# Patient Record
Sex: Male | Born: 1955 | Race: White | Hispanic: No | Marital: Married | Smoking: Never smoker
Health system: Southern US, Community
[De-identification: ages and names within clinical notes are randomized; demographics above are authoritative.]

## PROBLEM LIST (undated history)

## (undated) DIAGNOSIS — Z9289 Personal history of other medical treatment: Secondary | ICD-10-CM

## (undated) DIAGNOSIS — E119 Type 2 diabetes mellitus without complications: Secondary | ICD-10-CM

## (undated) DIAGNOSIS — M1990 Unspecified osteoarthritis, unspecified site: Secondary | ICD-10-CM

## (undated) DIAGNOSIS — I4891 Unspecified atrial fibrillation: Secondary | ICD-10-CM

## (undated) DIAGNOSIS — G4733 Obstructive sleep apnea (adult) (pediatric): Secondary | ICD-10-CM

## (undated) DIAGNOSIS — I10 Essential (primary) hypertension: Secondary | ICD-10-CM

## (undated) DIAGNOSIS — I2510 Atherosclerotic heart disease of native coronary artery without angina pectoris: Secondary | ICD-10-CM

## (undated) DIAGNOSIS — I5042 Chronic combined systolic (congestive) and diastolic (congestive) heart failure: Secondary | ICD-10-CM

## (undated) DIAGNOSIS — Z9889 Other specified postprocedural states: Secondary | ICD-10-CM

## (undated) DIAGNOSIS — I639 Cerebral infarction, unspecified: Secondary | ICD-10-CM

## (undated) DIAGNOSIS — I219 Acute myocardial infarction, unspecified: Secondary | ICD-10-CM

## (undated) DIAGNOSIS — I471 Supraventricular tachycardia, unspecified: Secondary | ICD-10-CM

## (undated) DIAGNOSIS — Z9989 Dependence on other enabling machines and devices: Secondary | ICD-10-CM

## (undated) DIAGNOSIS — J449 Chronic obstructive pulmonary disease, unspecified: Secondary | ICD-10-CM

## (undated) DIAGNOSIS — K219 Gastro-esophageal reflux disease without esophagitis: Secondary | ICD-10-CM

## (undated) DIAGNOSIS — A419 Sepsis, unspecified organism: Secondary | ICD-10-CM

## (undated) DIAGNOSIS — I350 Nonrheumatic aortic (valve) stenosis: Secondary | ICD-10-CM

## (undated) DIAGNOSIS — J101 Influenza due to other identified influenza virus with other respiratory manifestations: Secondary | ICD-10-CM

## (undated) HISTORY — PX: TRACHEOSTOMY: SUR1362

## (undated) HISTORY — PX: TRACHEOSTOMY CLOSURE: SHX458

## (undated) HISTORY — PX: KNEE SURGERY: SHX244

## (undated) HISTORY — PX: BUNIONECTOMY: SHX129

---

## 2004-05-08 ENCOUNTER — Ambulatory Visit: Payer: Self-pay | Admitting: Unknown Physician Specialty

## 2004-10-08 ENCOUNTER — Ambulatory Visit: Payer: Self-pay | Admitting: Cardiovascular Disease

## 2004-12-30 ENCOUNTER — Encounter: Payer: Self-pay | Admitting: Cardiovascular Disease

## 2004-12-31 ENCOUNTER — Encounter: Payer: Self-pay | Admitting: Cardiovascular Disease

## 2005-01-14 ENCOUNTER — Encounter: Payer: Self-pay | Admitting: Cardiovascular Disease

## 2005-02-14 ENCOUNTER — Encounter: Payer: Self-pay | Admitting: Cardiovascular Disease

## 2005-03-16 ENCOUNTER — Encounter: Payer: Self-pay | Admitting: Cardiovascular Disease

## 2005-08-18 ENCOUNTER — Ambulatory Visit: Payer: Self-pay | Admitting: Cardiovascular Disease

## 2005-10-26 ENCOUNTER — Emergency Department: Payer: Self-pay | Admitting: Emergency Medicine

## 2005-12-25 ENCOUNTER — Ambulatory Visit: Payer: Self-pay | Admitting: Internal Medicine

## 2005-12-27 ENCOUNTER — Observation Stay: Payer: Self-pay | Admitting: Internal Medicine

## 2005-12-27 ENCOUNTER — Other Ambulatory Visit: Payer: Self-pay

## 2005-12-28 ENCOUNTER — Other Ambulatory Visit: Payer: Self-pay

## 2006-03-26 ENCOUNTER — Ambulatory Visit: Payer: Self-pay | Admitting: Orthopaedic Surgery

## 2006-04-22 ENCOUNTER — Other Ambulatory Visit: Payer: Self-pay

## 2006-04-29 ENCOUNTER — Ambulatory Visit: Payer: Self-pay | Admitting: Orthopaedic Surgery

## 2006-06-05 IMAGING — CR DG CHEST 1V PORT
1 series · 1 of 1 positions shown · non-contrast
Comparison: none

REASON FOR EXAM: chest pain   pt in rm 1
COMMENTS:

PROCEDURE:     DXR - DXR PORTABLE CHEST SINGLE VIEW  - October 08, 2004 [DATE]
RESULT:       Portable view of the chest shows the lungs to be clear and
well expanded without evidence of infiltrates, effusions or mass.

[view not recorded]
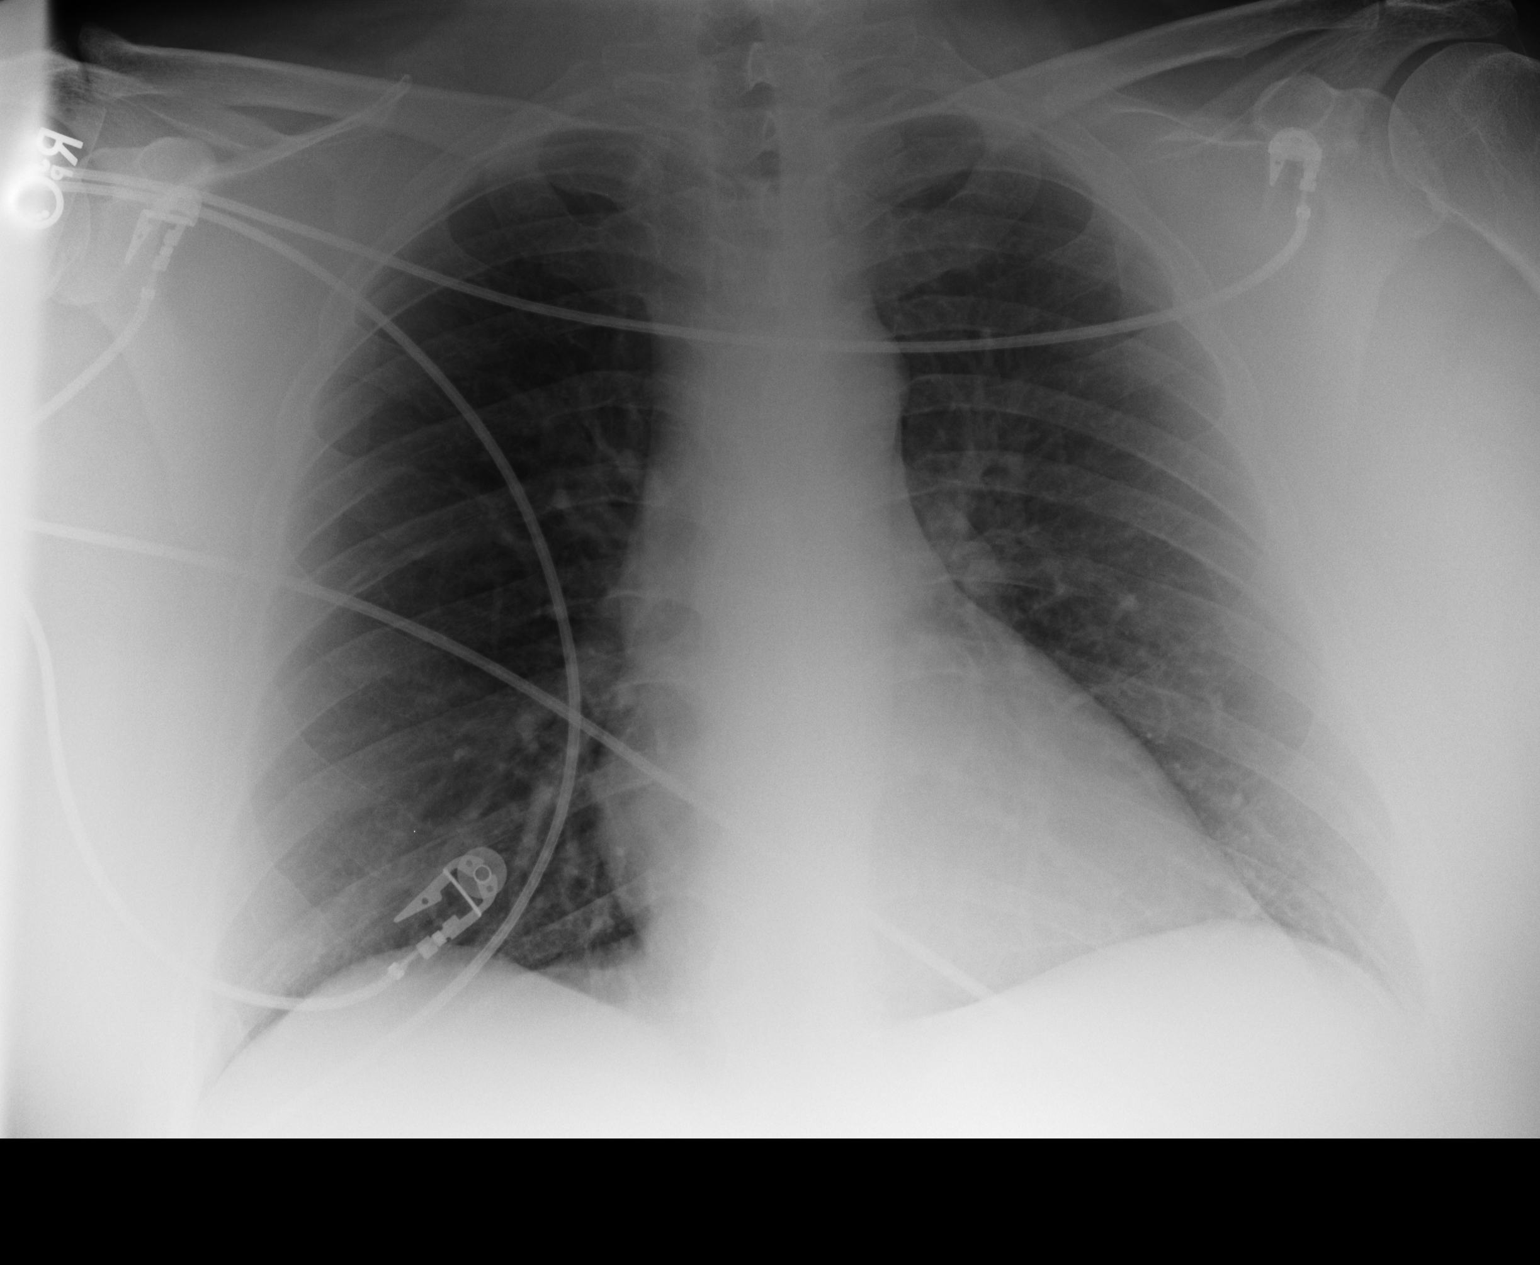

[1 of 1 positions shown; findings below may reference images not displayed]

IMPRESSION: No acute pulmonary disease.

## 2006-12-20 ENCOUNTER — Emergency Department: Payer: Self-pay | Admitting: Emergency Medicine

## 2007-03-25 ENCOUNTER — Ambulatory Visit: Payer: Self-pay | Admitting: Family Medicine

## 2007-07-03 ENCOUNTER — Emergency Department: Payer: Self-pay | Admitting: Emergency Medicine

## 2007-08-24 IMAGING — CR DG CHEST 1V PORT
1 series · 1 of 1 positions shown · non-contrast
Comparison: none

REASON FOR EXAM: chest pain
COMMENTS:

PROCEDURE:     DXR - DXR PORTABLE CHEST SINGLE VIEW  - December 27, 2005  [DATE]
RESULT:          The lungs are clear.  The cardiovascular structures are
unremarkable.  The patient has had a prior median sternotomy and CABG.

[view not recorded]
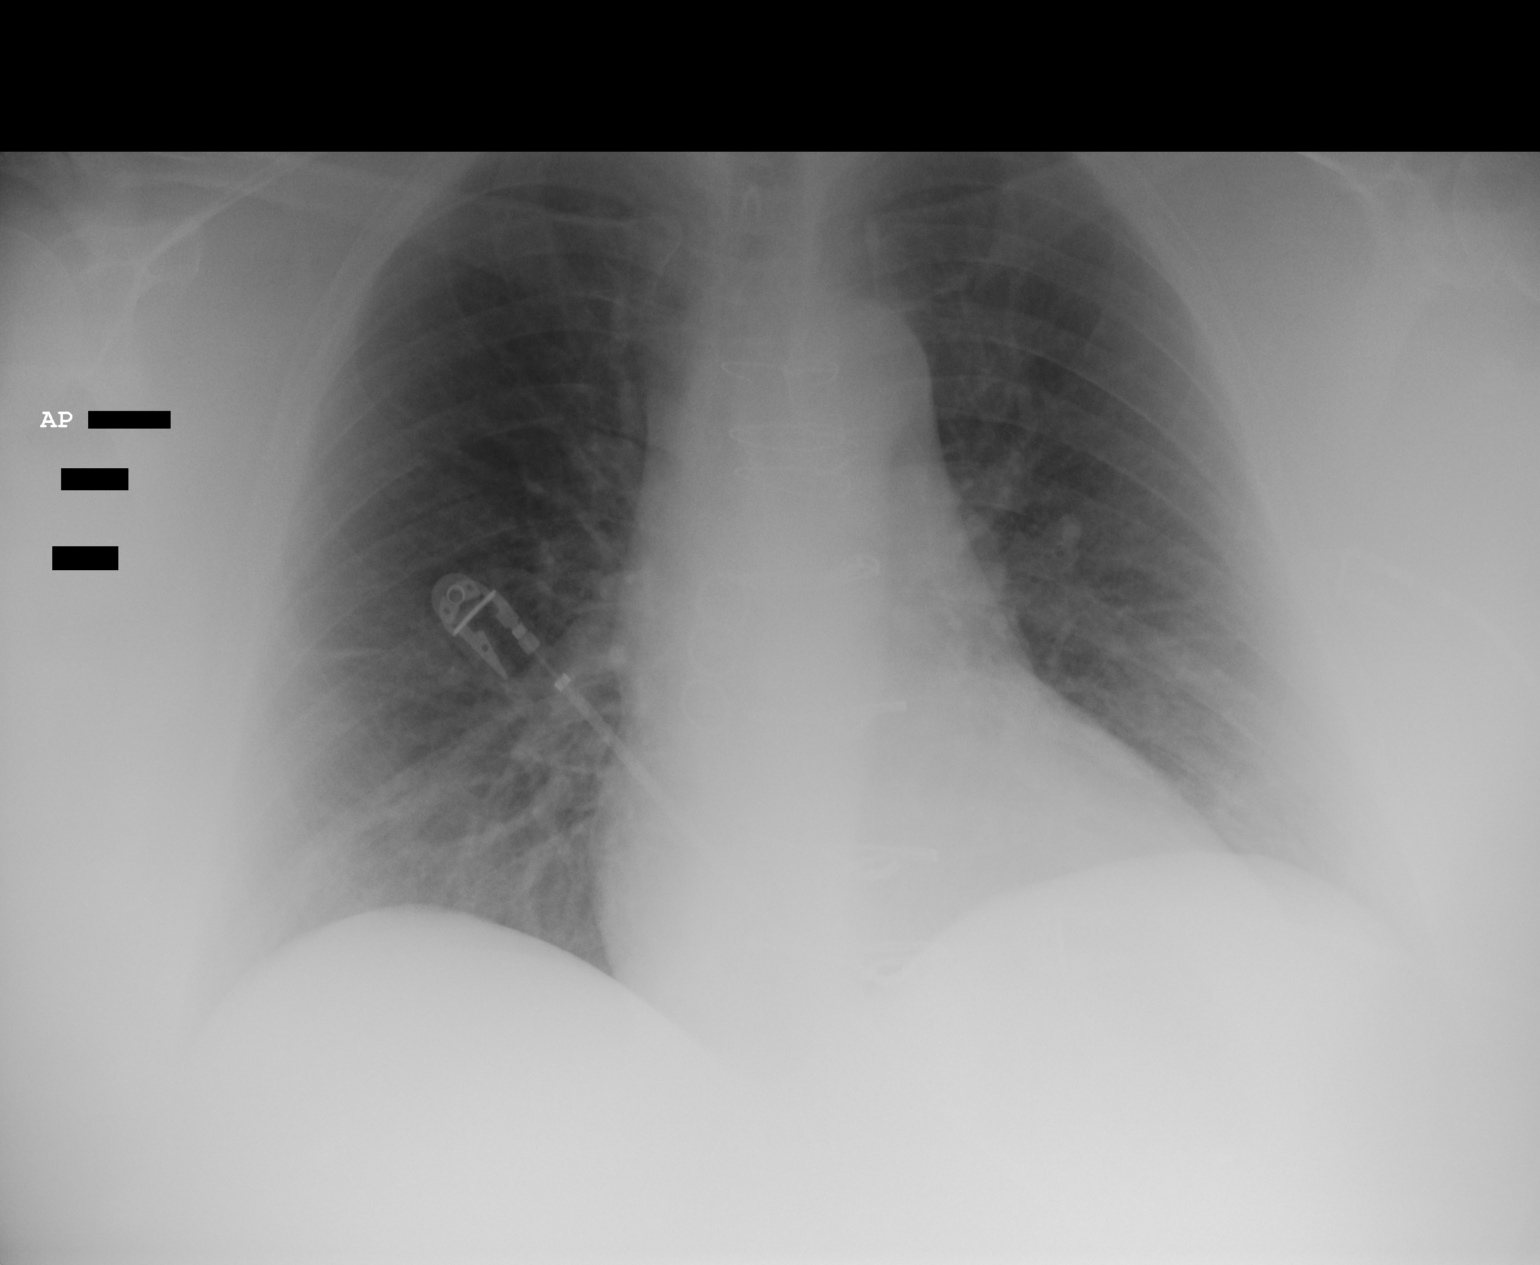

[1 of 1 positions shown; findings below may reference images not displayed]

IMPRESSION: No acute cardiopulmonary disease.

## 2007-10-05 ENCOUNTER — Ambulatory Visit: Payer: Self-pay | Admitting: Specialist

## 2007-11-21 IMAGING — MR MRI OF THE RIGHT KNEE WITHOUT CONTRAST
4 of 5 series · 24 of 40 positions shown · non-contrast
Comparison: none

REASON FOR EXAM: right knee pain
COMMENTS:

[Series 3: t2_axial_flash · axial · 5.0mm · 0.31mm/px · z∈[-76,+50]mm · 6 of 26 slices shown]
[im 1/26]
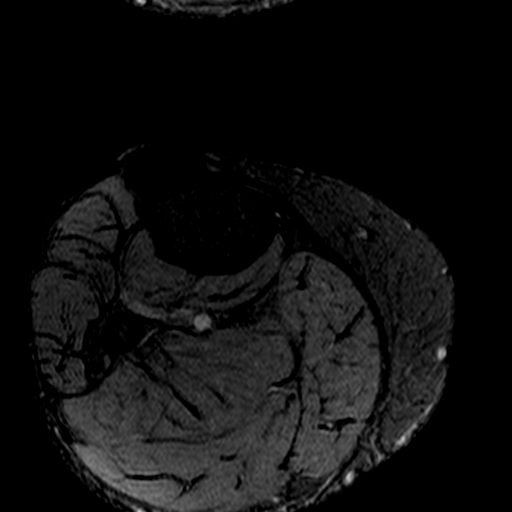
[im 4/26]
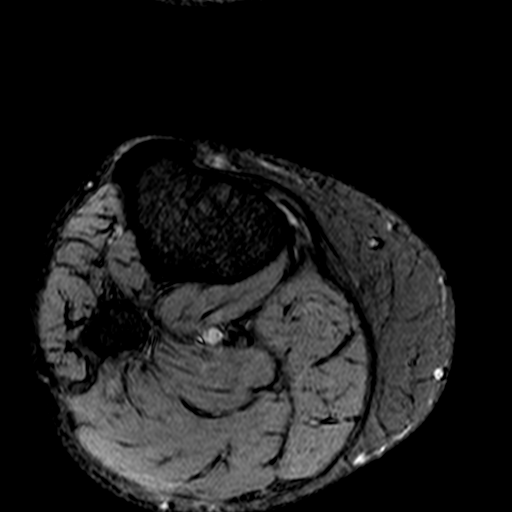
[im 7/26]
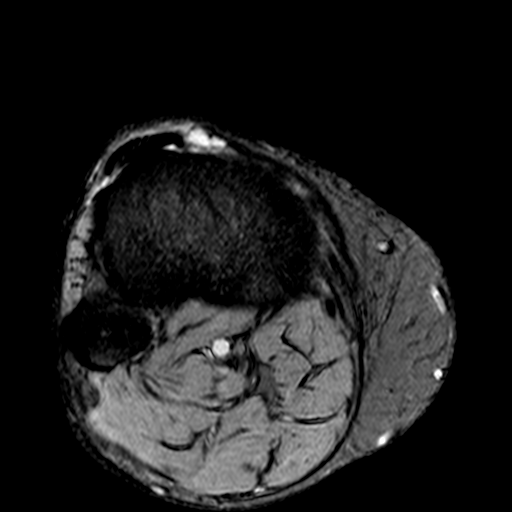
[im 10/26]
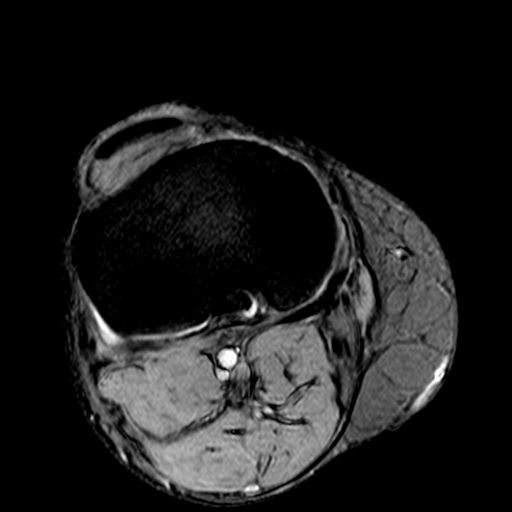
[im 13/26]
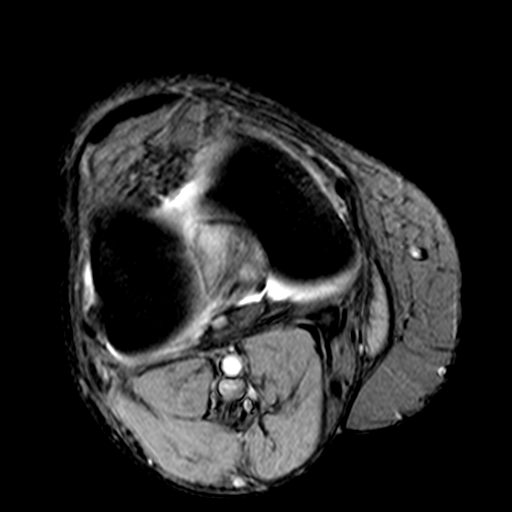
[im 22/26]
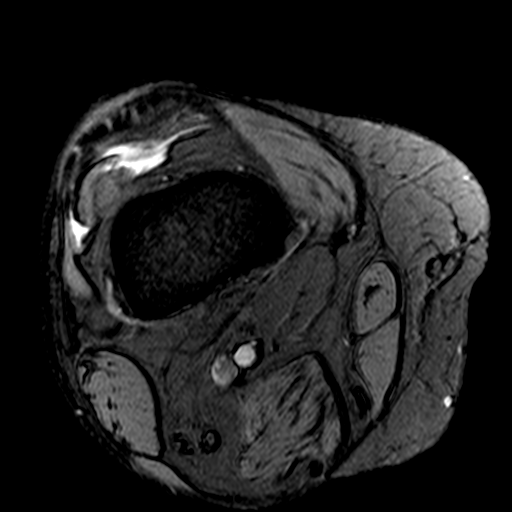

[Series 5: pd__coronal · coronal · 4.0mm · 0.31mm/px · 3 of 26 slices shown]
[im 4/26]
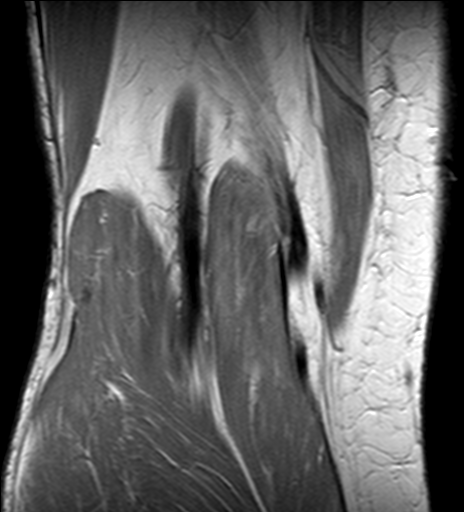
[im 13/26]
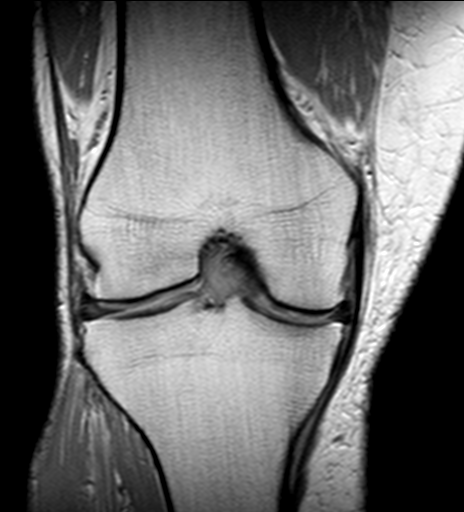
[im 22/26]
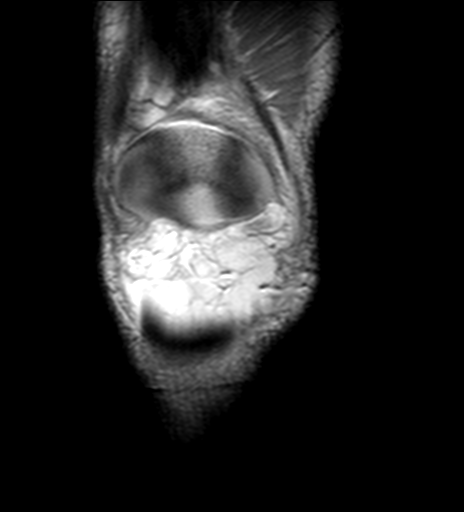

[Series 7: T2 fat-sat · axial · 5.0mm · 0.31mm/px · z∈[+14,+89]mm · 8 of 26 slices shown]
[im 1/26]
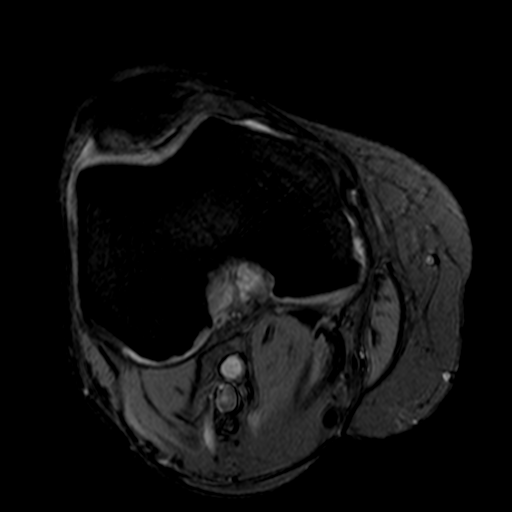
[im 4/26]
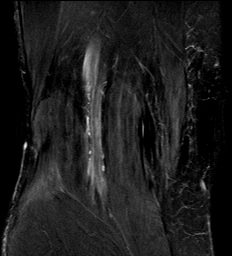
[im 8/26]
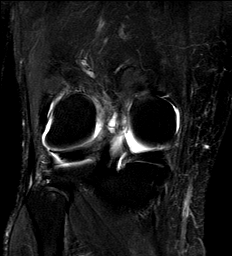
[im 11/26]
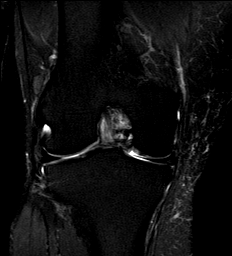
[im 15/26]
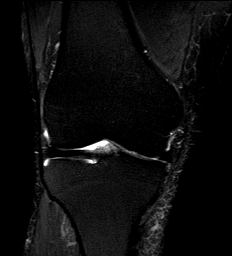
[im 18/26]
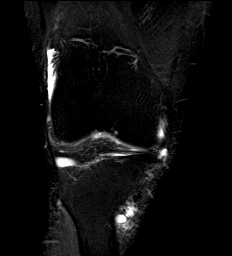
[im 22/26]
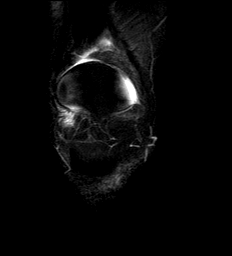
[im 26/26]
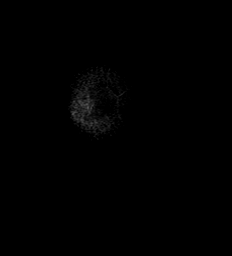

[Series 9: PD · axial · 5.0mm · 0.31mm/px · z∈[+14,+89]mm · 7 of 23 slices shown]
[im 1/23]
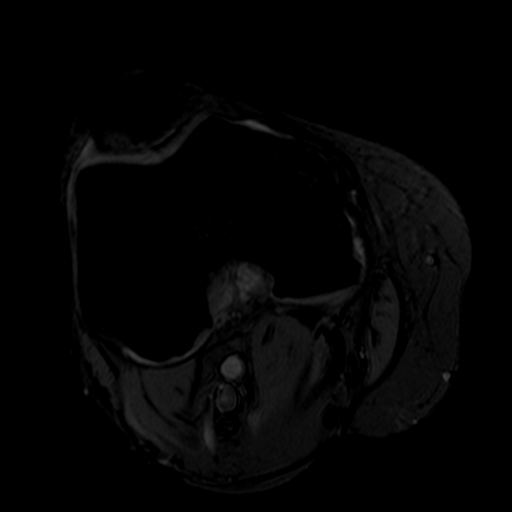
[im 4/23]
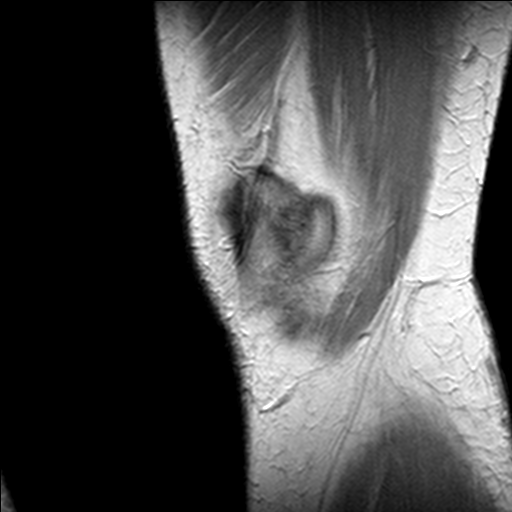
[im 8/23]
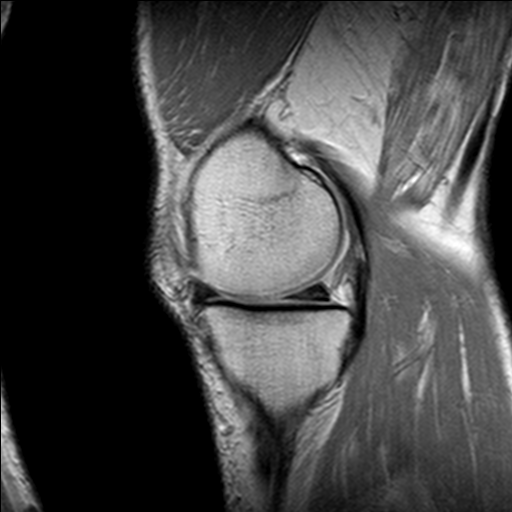
[im 12/23]
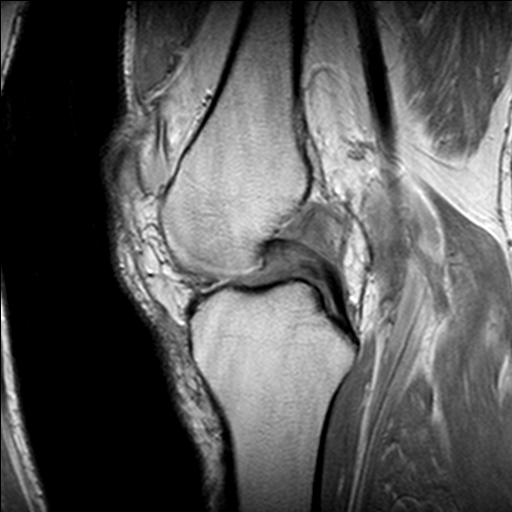
[im 15/23]
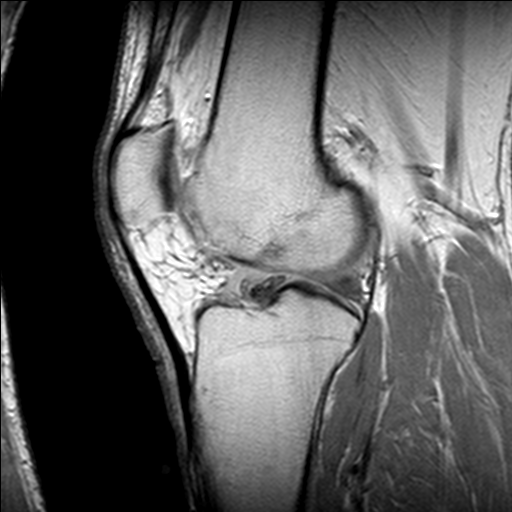
[im 19/23]
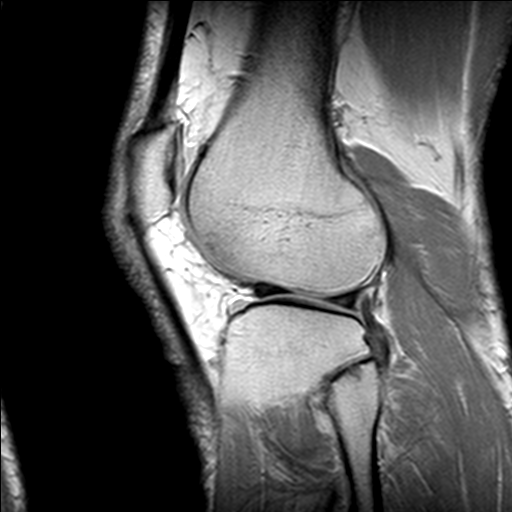
[im 23/23]
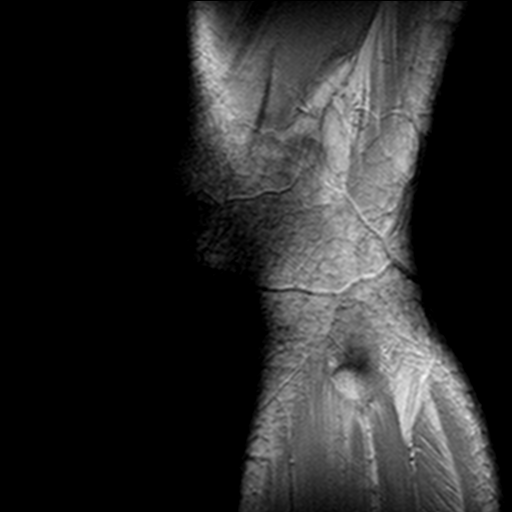

[24 of 40 positions shown; findings below may reference images not displayed]

PROCEDURE:     MR  - MR KNEE RT  WO  CONTRAST  - March 26, 2006  [DATE]

RESULT:     Routine non-gadolinium MR imaging of the RIGHT knee demonstrates
the PCL appears to be intact. The ACL appears to have intact fibers, but
does show abnormal, increased signal, which may indicate a partial tear or
severe sprain. The PCL appears to be unremarkable.  The quadriceps tendon
and patellar ligament appear to be intact. There is medial compartmental
narrowing with some anterior extrusion of the disc. There is some slight
medial extrusion of the medial meniscus, but no meniscocapsular separation
is evident. There is some increased signal within the medial meniscus in the
posterior horn, which could represent severe degenerative change. There is a
concern that on the sagittal T2 weighted fat suppressed images #7 that this
may extend to the inferior surface of the posterior horn.  A small joint
effusion is present in the suprapatellar bursa.  The patellar retinacula
appear to be intact. The medial and lateral collateral ligamentous complexes
appear to be intact. There is no evidence of marrow edema or osteochondral
defect. No occult fracture is evident.
IMPRESSION: 1)Abnormal signal within the ACL and the posterior horn of the medial
meniscus. Changes can be consistent with partial tear of the ACL and
degenerative changes in the posterior horn of the medial meniscus, possibly
with a tear extending to the inferior surface of the posterior horn.

2)Degenerative joint space narrowing present.

## 2008-03-03 ENCOUNTER — Ambulatory Visit: Payer: Self-pay | Admitting: Family Medicine

## 2008-03-15 ENCOUNTER — Other Ambulatory Visit: Payer: Self-pay

## 2008-03-15 ENCOUNTER — Ambulatory Visit: Payer: Self-pay | Admitting: General Surgery

## 2008-03-15 ENCOUNTER — Ambulatory Visit: Payer: Self-pay | Admitting: Family Medicine

## 2008-03-20 ENCOUNTER — Ambulatory Visit: Payer: Self-pay | Admitting: General Surgery

## 2008-05-01 ENCOUNTER — Emergency Department: Payer: Self-pay | Admitting: Emergency Medicine

## 2008-08-16 IMAGING — CR DG CHEST 2V
1 series · 2 of 2 positions shown · non-contrast
Comparison: none

REASON FOR EXAM: cough
COMMENTS:

[Series 1: view not recorded · 0.17mm/px · 2 of 2 slices shown]
[im 1/2]
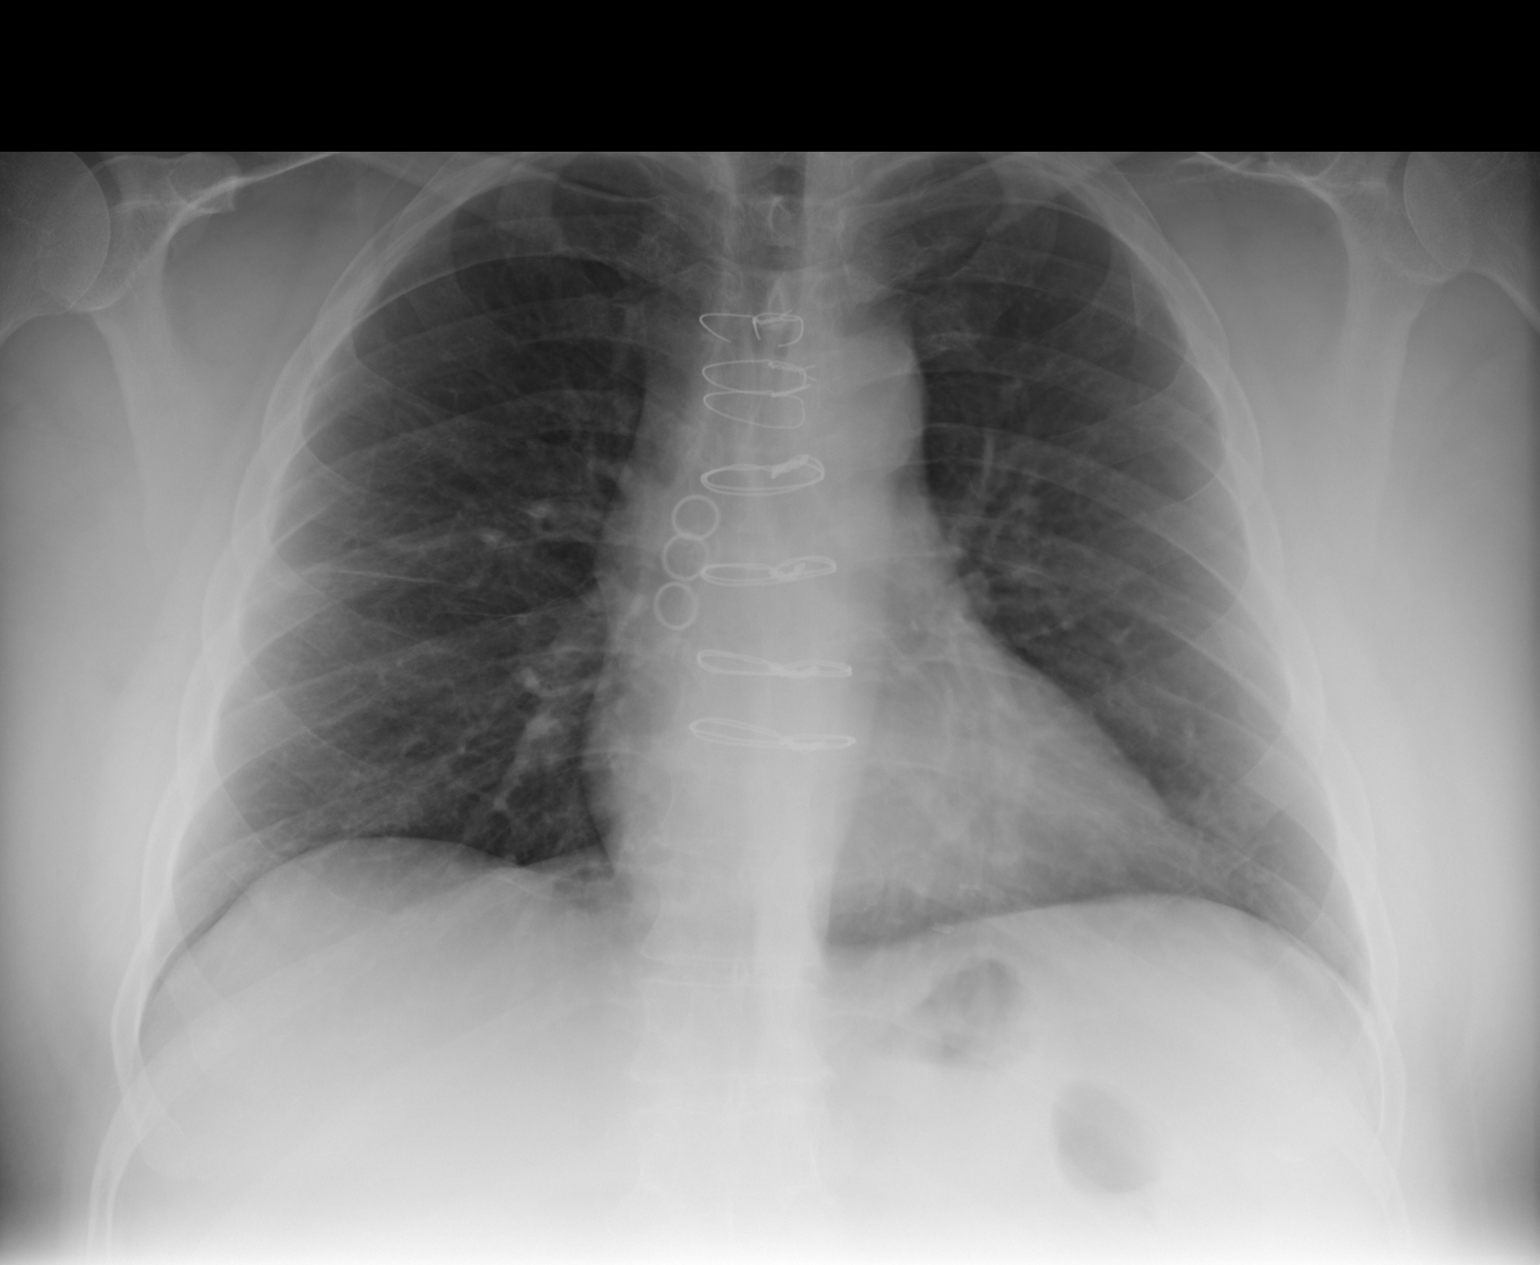
[im 2/2]
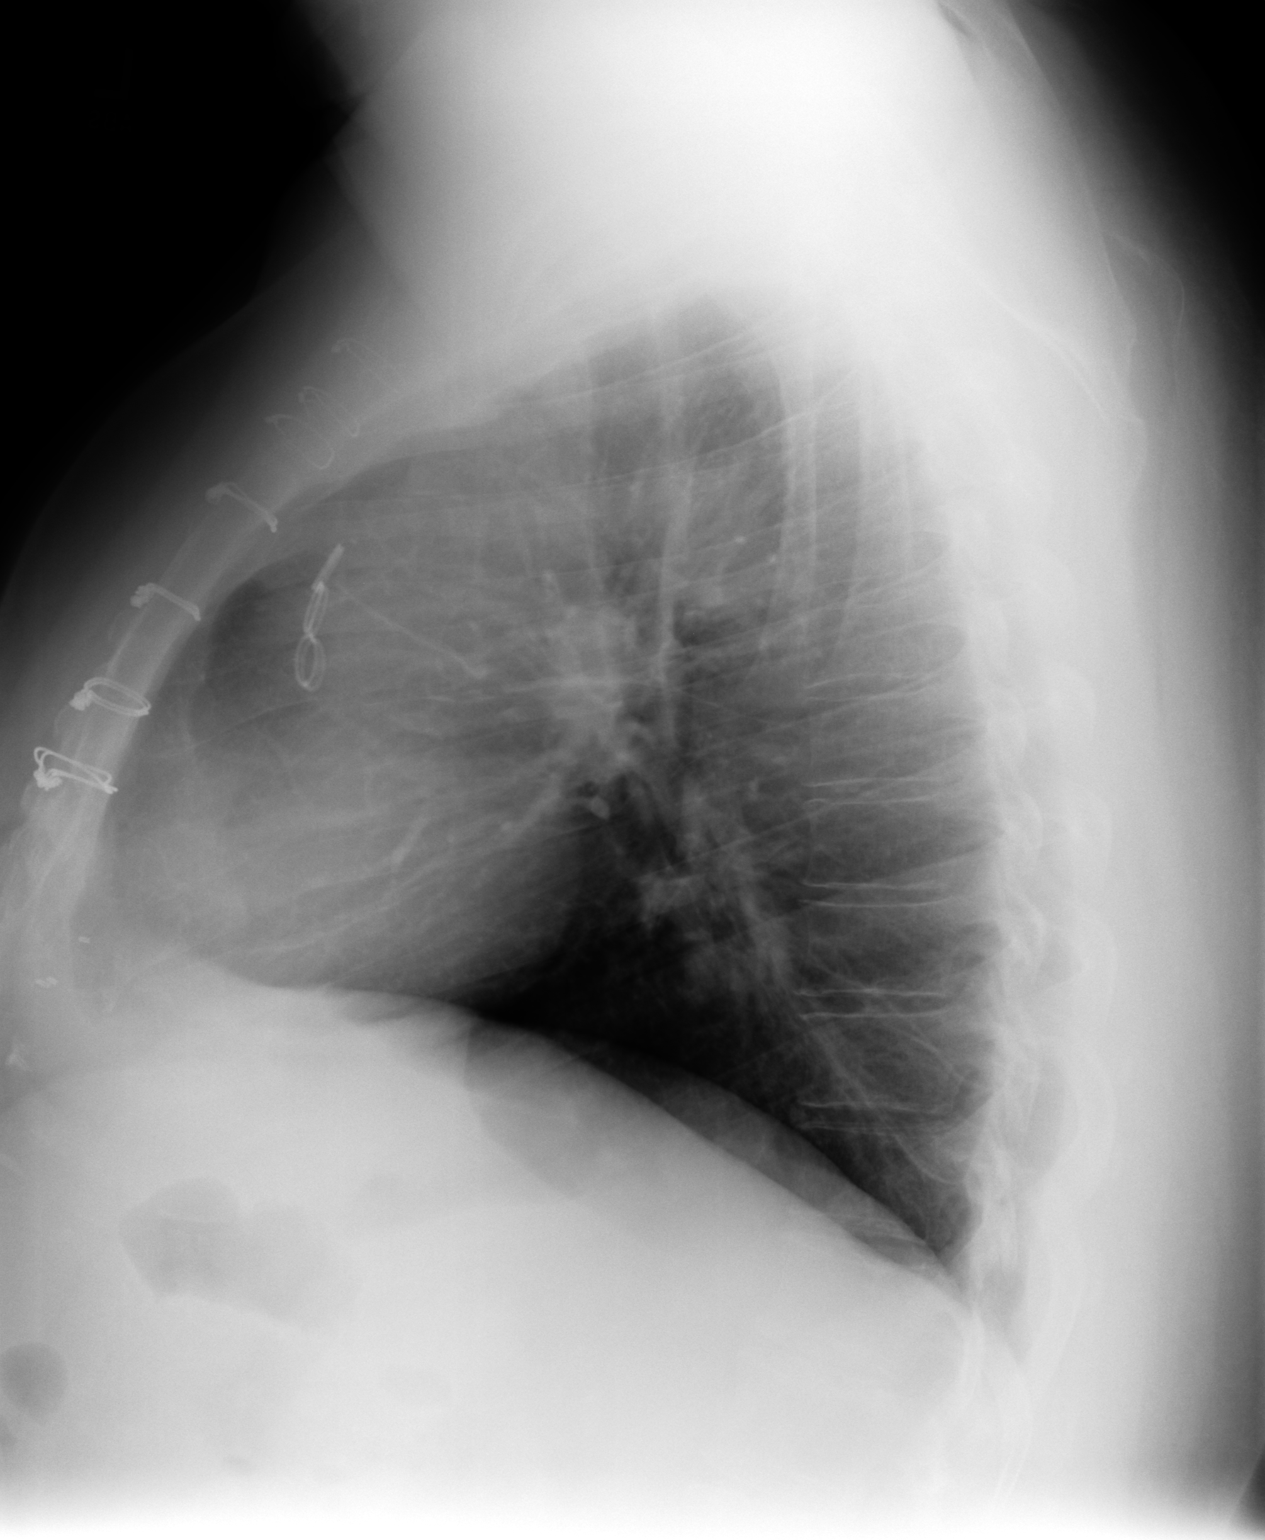

[2 of 2 positions shown; findings below may reference images not displayed]

PROCEDURE:     DXR - DXR CHEST PA (OR AP) AND LATERAL  - December 20, 2006 [DATE]

RESULT:     Comparison is made to a study 22 April, 2006.

The lungs are adequately inflated. The heart is normal in size. The patient
has undergone prior CABG. There is no evidence of pneumonia. The lung
markings are coarse, however, in the infrahilar region on the lateral film
posteriorly. This may reflect subsegmental atelectasis.
IMPRESSION: 1. There is no evidence of CHF.
2. Mildly increased interstitial markings as well as coarse lung markings in
the infrahilar regions posteriorly may indicate an element of acute
bronchitis and reactive airway disease. A follow-up PA and lateral chest
x-ray following therapy would be of value.

## 2008-11-01 ENCOUNTER — Ambulatory Visit: Payer: Self-pay | Admitting: Specialist

## 2008-11-02 ENCOUNTER — Emergency Department: Payer: Self-pay | Admitting: Emergency Medicine

## 2009-02-27 IMAGING — CR DG HAND COMPLETE 3+V*L*
1 series · 3 of 3 positions shown · non-contrast
Comparison: none

REASON FOR EXAM: Injury
COMMENTS:

PROCEDURE:     DXR - DXR HAND LT COMPLETE  W/OBLIQUES  - July 03, 2007  [DATE]
RESULT:     Degenerative changes are appreciated involving the proximal and
distal interphalangeal joints. There does not appear to be evidence of acute
fracture or dislocation or malalignment.

[Series 1: view not recorded · 0.17mm/px · 3 of 3 slices shown]
[im 1/3]
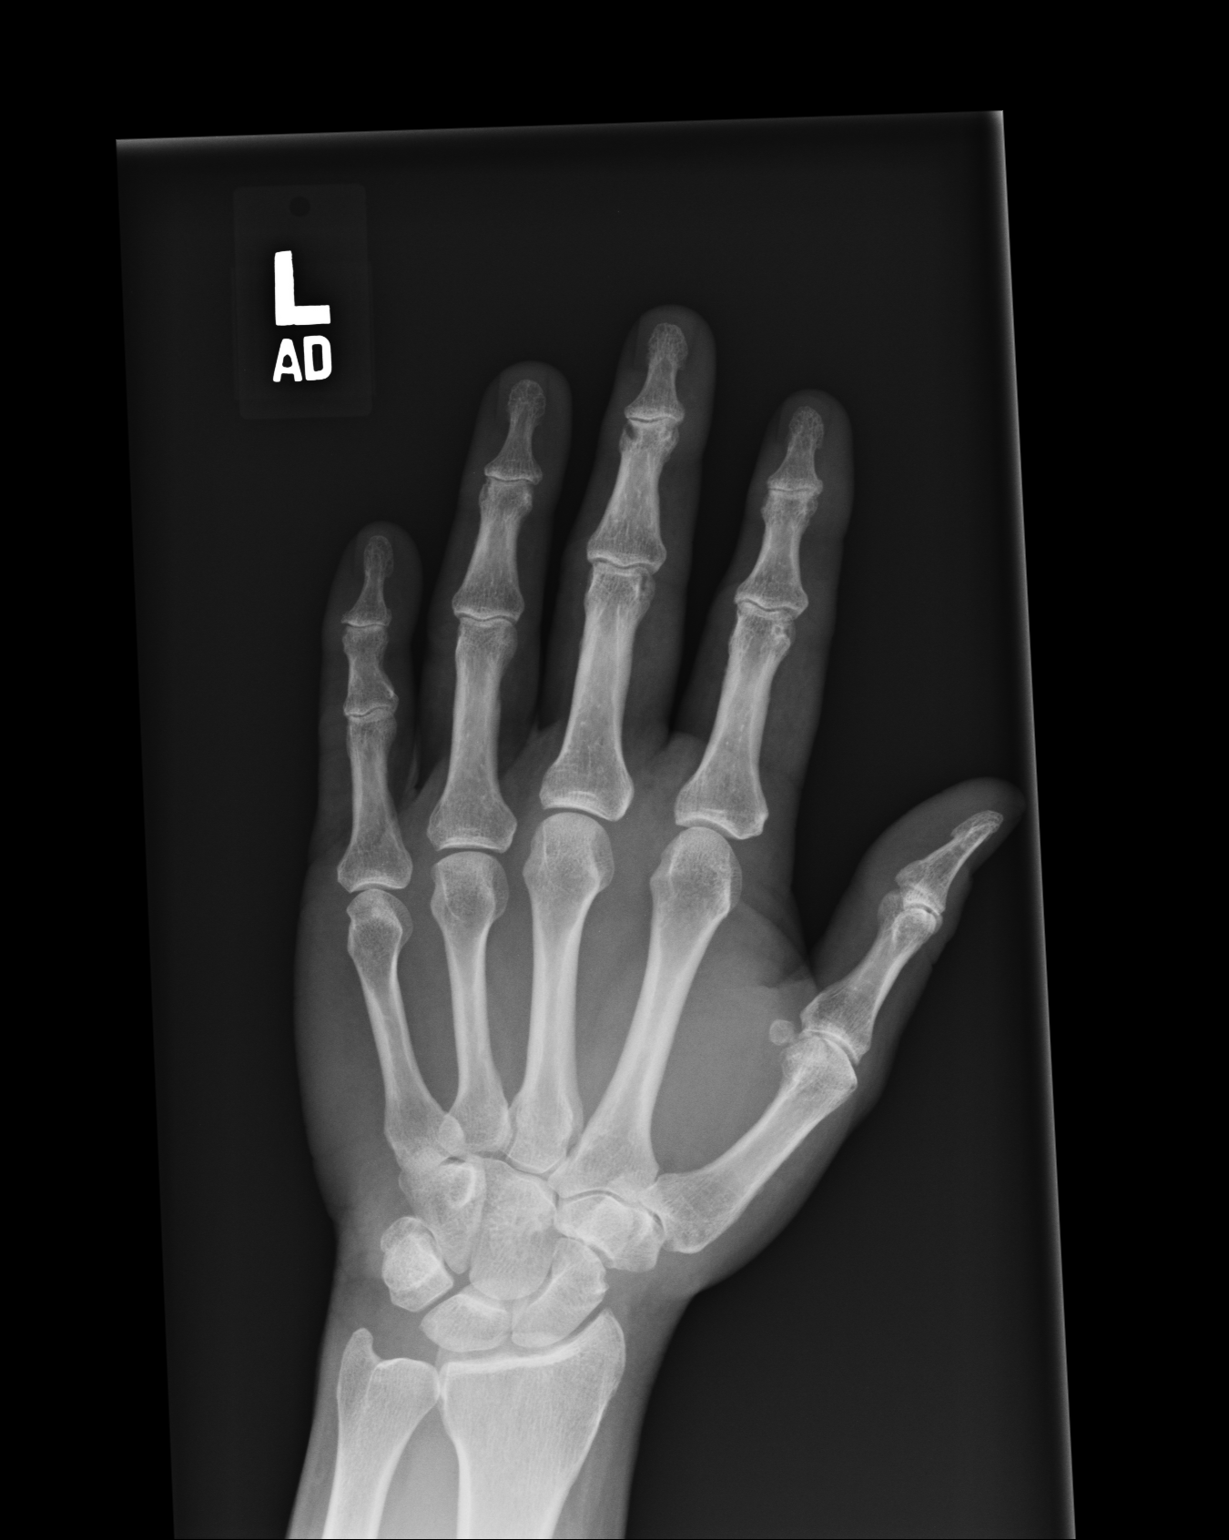
[im 2/3]
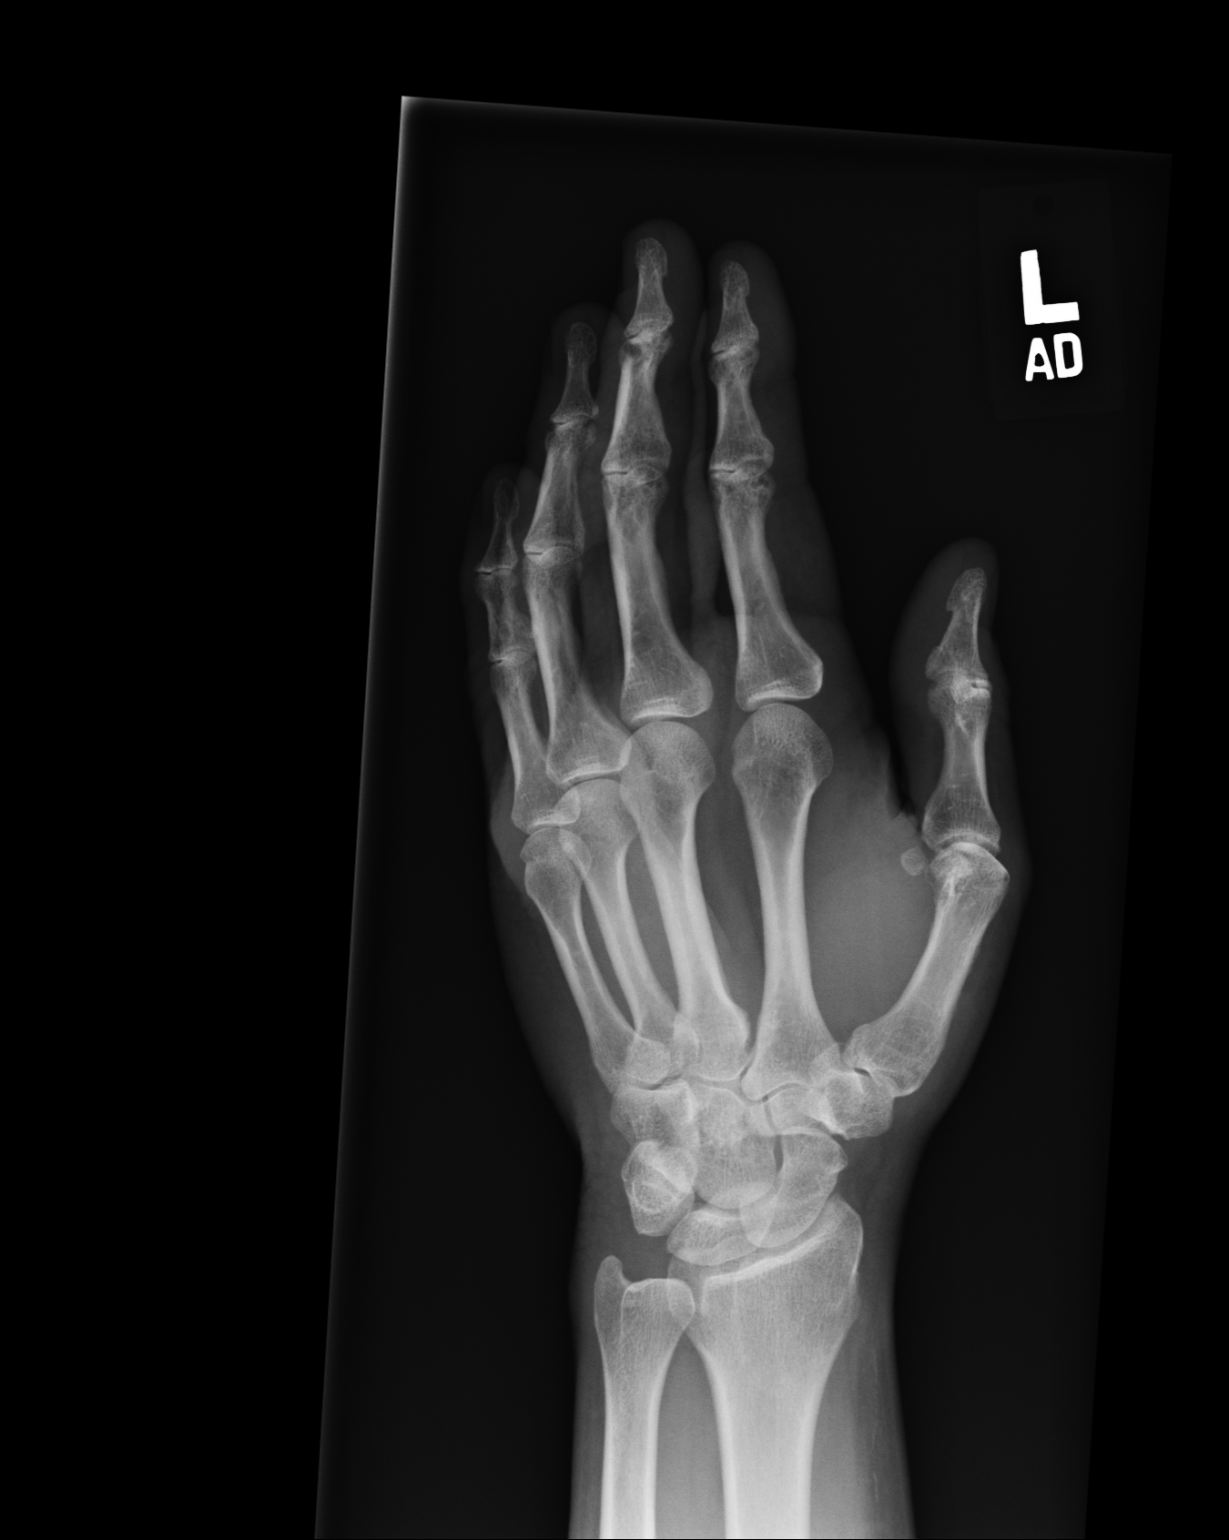
[im 3/3]
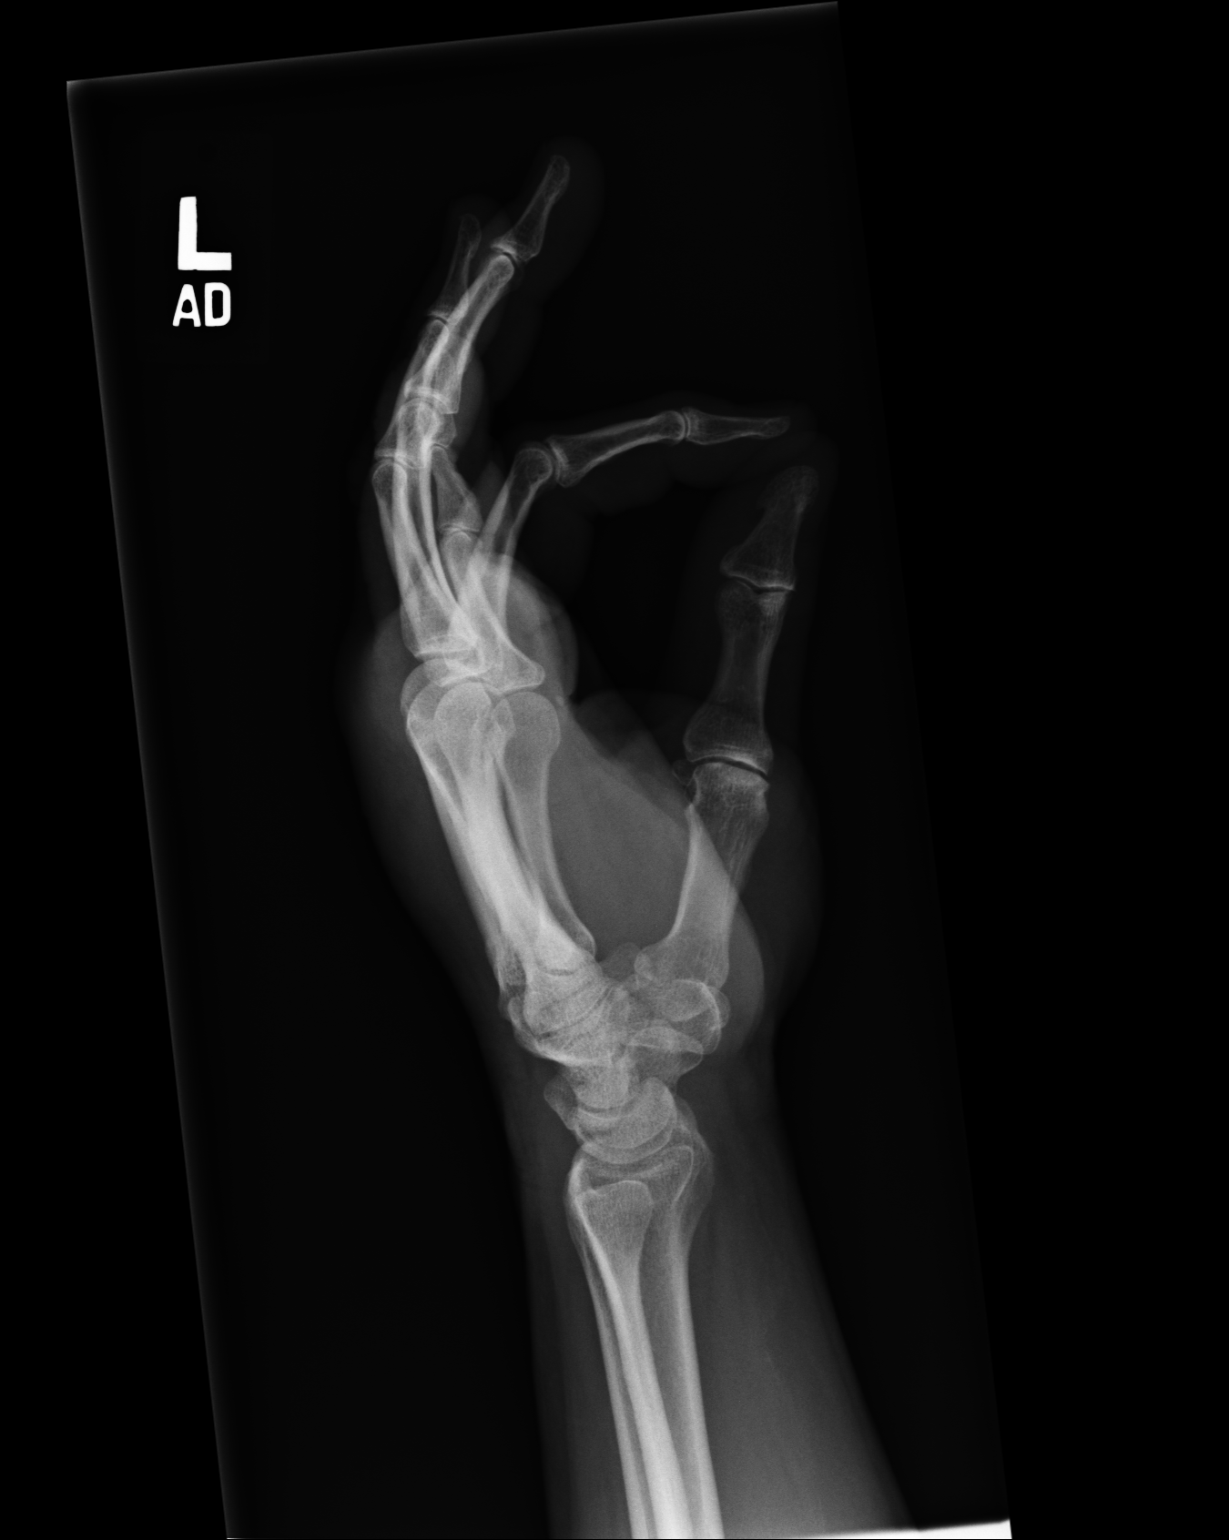

[3 of 3 positions shown; findings below may reference images not displayed]

IMPRESSION: 1.     No evidence of acute osseous abnormalities.
2.     If there is persistent clinical concern, repeat evaluation is
recommended, if and as clinically warranted.

## 2009-06-11 ENCOUNTER — Ambulatory Visit: Payer: Self-pay | Admitting: Unknown Physician Specialty

## 2009-09-18 ENCOUNTER — Emergency Department: Payer: Self-pay | Admitting: Emergency Medicine

## 2009-10-29 IMAGING — CR RIGHT FOOT COMPLETE - 3+ VIEW
1 series · 3 of 3 positions shown · non-contrast
Comparison: none

REASON FOR EXAM: pain
COMMENTS:

[Series 2: view not recorded · 0.17mm/px · 3 of 3 slices shown]
[im 1/3]
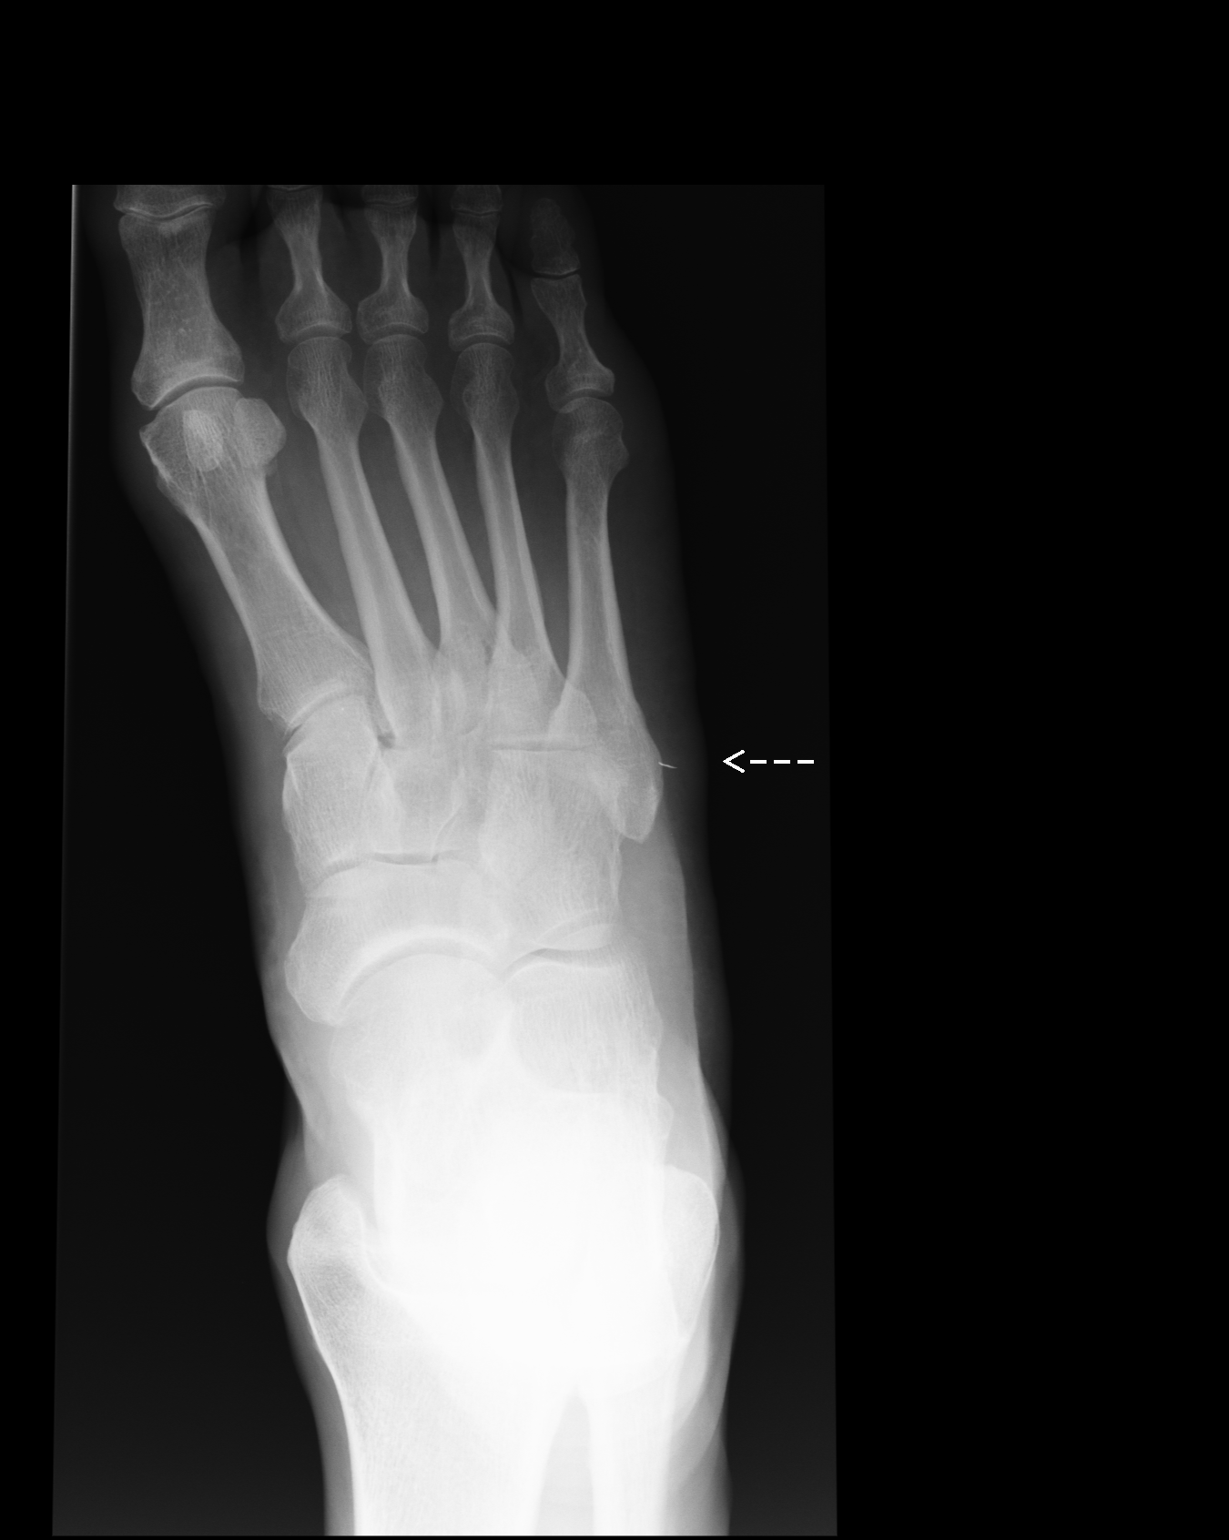
[im 2/3]
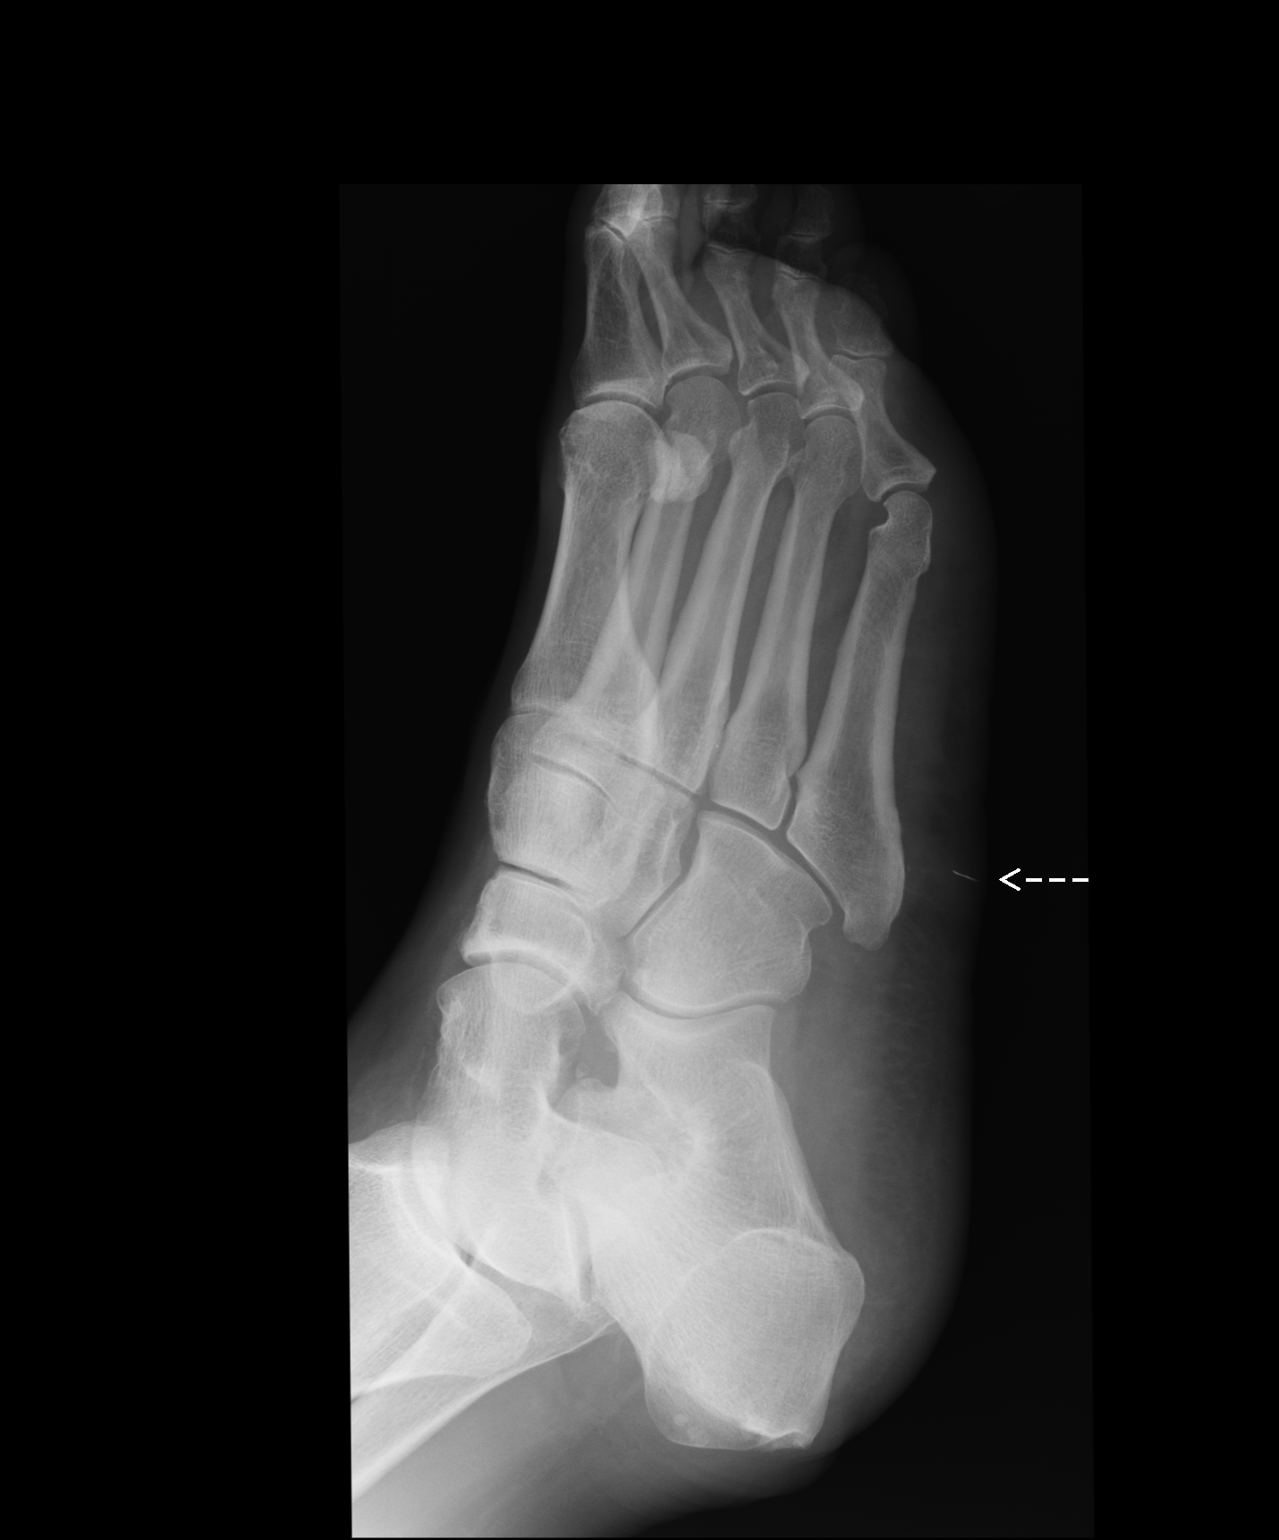
[im 3/3]
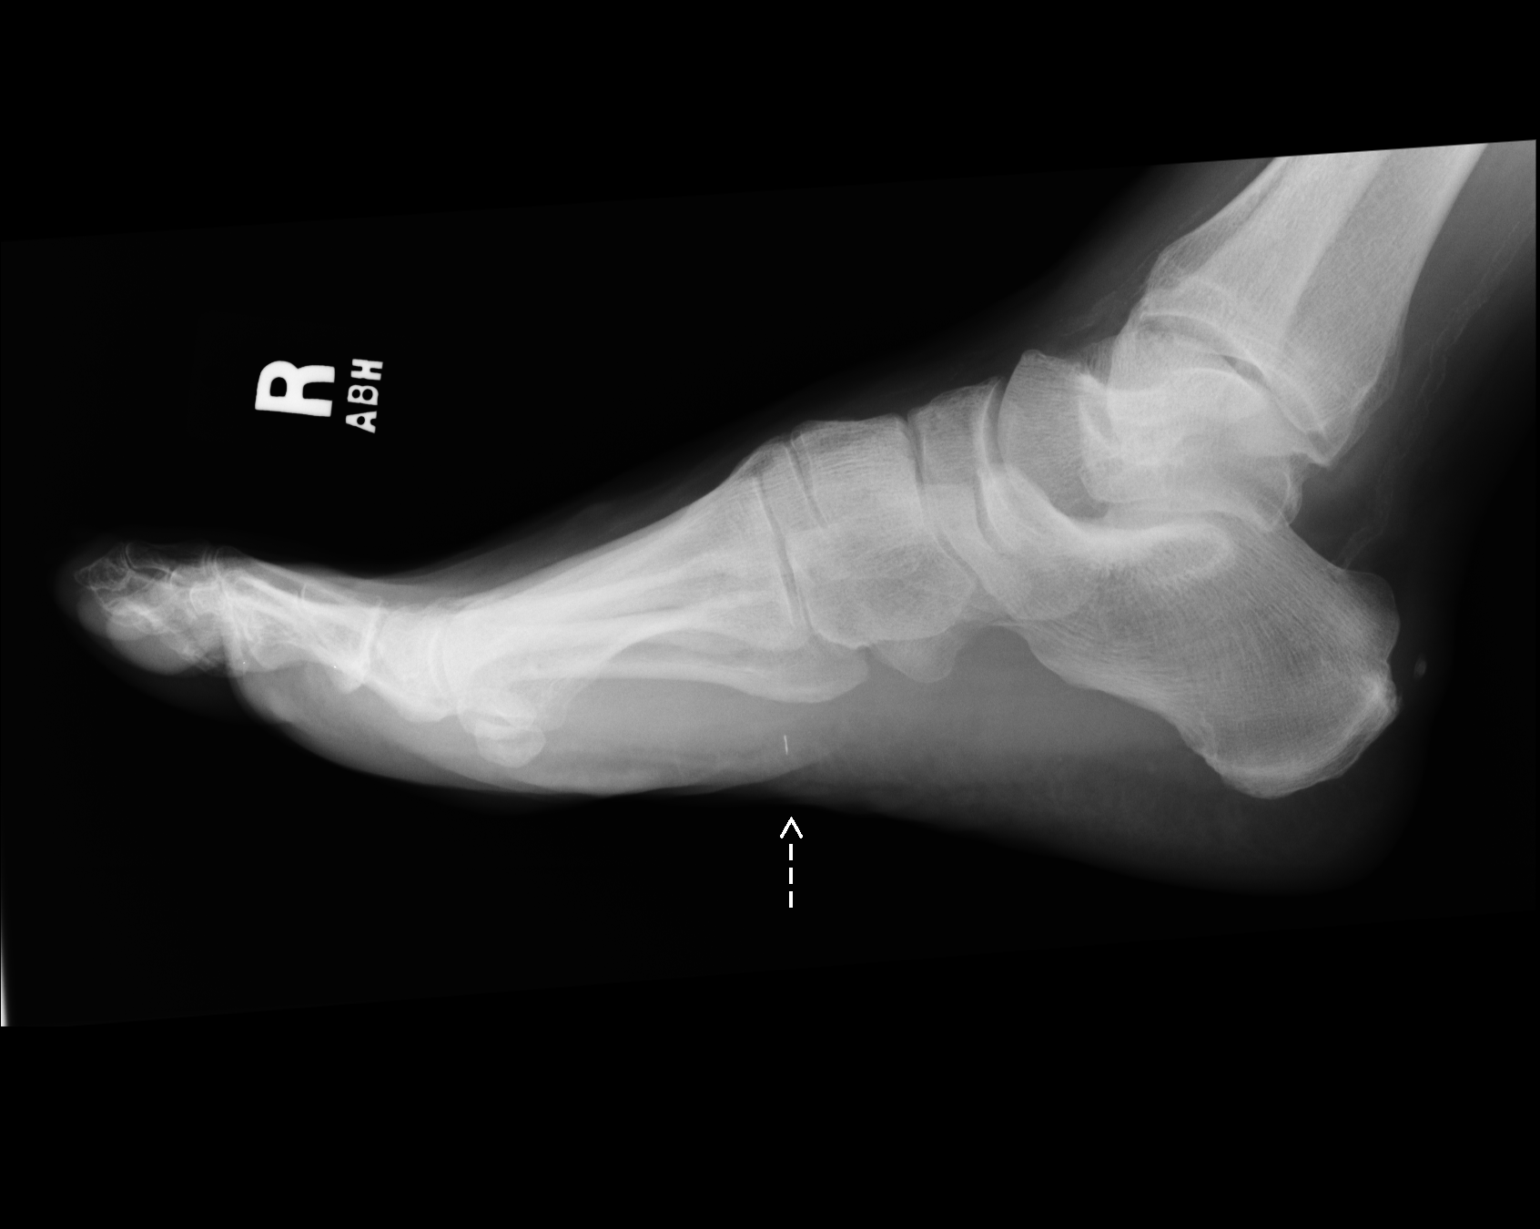

[3 of 3 positions shown; findings below may reference images not displayed]

PROCEDURE:     KDR - KDXR FOOT RT COMPLETE W/OBLIQUES  - March 03, 2008  [DATE]

RESULT:     Three views were obtained. No fracture or dislocation is seen.
No bony reactive change or periosteal elevation is observed. There is a
linear metallic appearing density adjacent to the base of the fifth
metatarsal. This measures approximately 3.5 mm in length and has the
appearance of the fragment of thin metallic wire. In the oblique view the
finding is noted to be superficial in location and is separated from the
fifth metatarsal by approximately 1 cm. The tip of the foreign body is
located just beneath the skin surface.
IMPRESSION: 1. No fracture or dislocation is seen.
2. There is a thin linear metallic density soft tissue foreign body located
superficially in the soft tissues at the level of the base of the fifth
metatarsal .

## 2009-12-27 IMAGING — CT CT STONE STUDY
1 of 2 series · 15 of 32 positions shown, 19 images · non-contrast
Comparison: 03/25/2007

REASON FOR EXAM: lower abd apion dysuria, pain radiates into r testicle
COMMENTS:

PROCEDURE:     CT  - CT ABDOMEN /PELVIS WO (STONE)  - May 01, 2008 [DATE]
RESULT:     Indication: Suprapubic pain
TECHNIQUE: Multiple axial images from the lung bases to the symphysis pubis
were obtained without oral or intravenous contrast.

[Series 2: soft tissue · axial · 0.85mm/px · z∈[-1170,-738]mm · 15 of 164 slices shown, 19 images]
[im 13/164  soft-tissue]
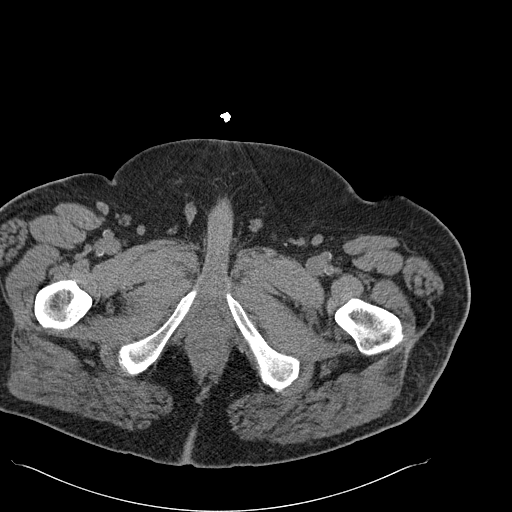
[im 13/164  bone]
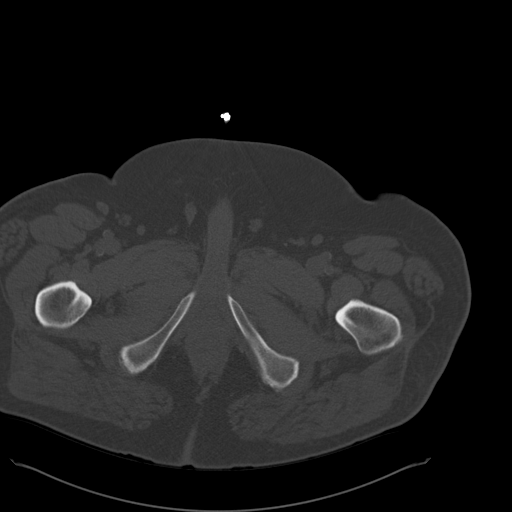
[im 25/164  soft-tissue]
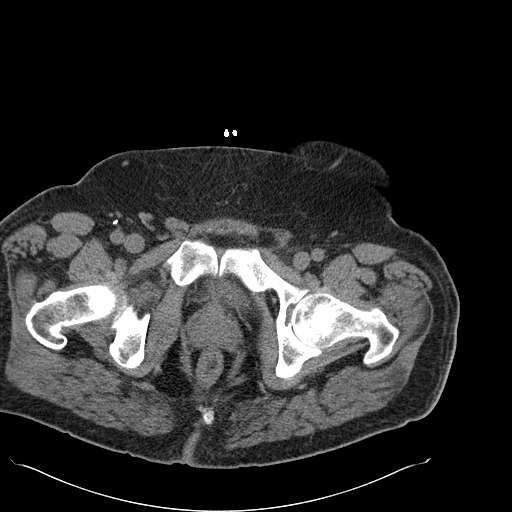
[im 37/164  soft-tissue]
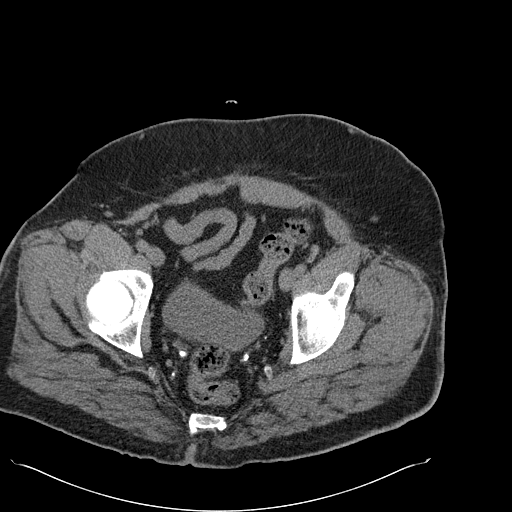
[im 49/164  soft-tissue]
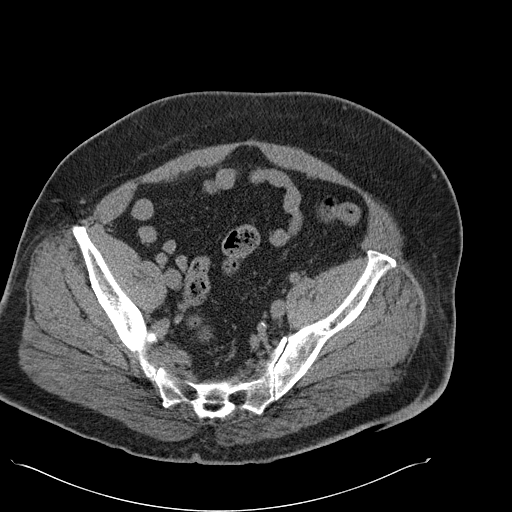
[im 61/164  soft-tissue]
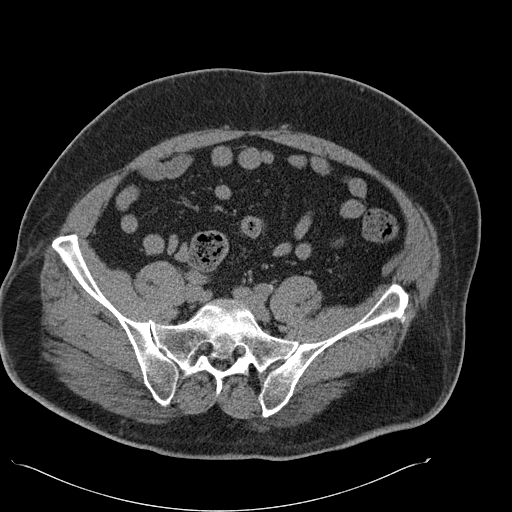
[im 73/164  soft-tissue]
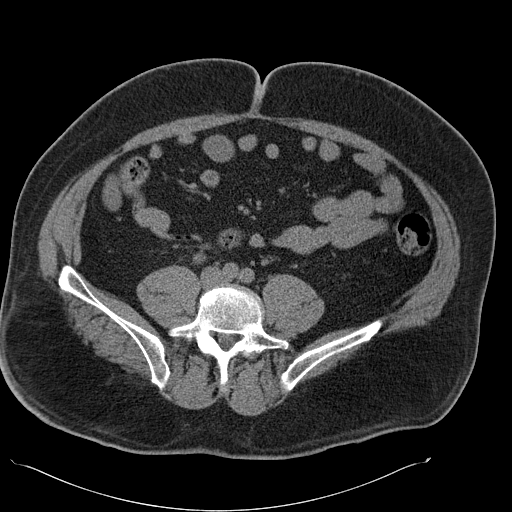
[im 85/164  soft-tissue]
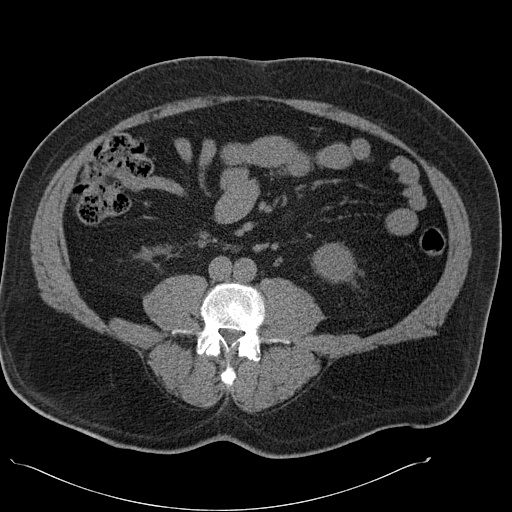
[im 97/164  soft-tissue]
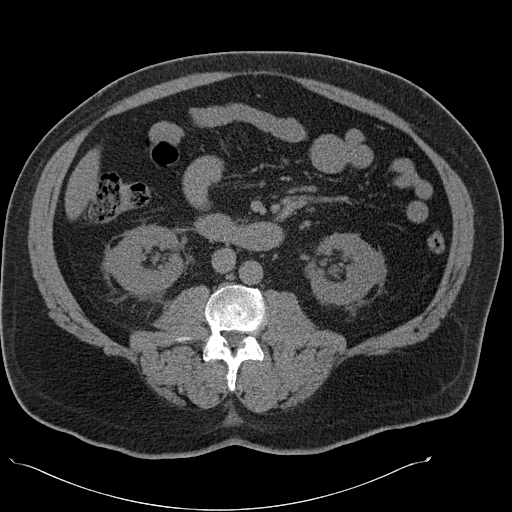
[im 109/164  soft-tissue]
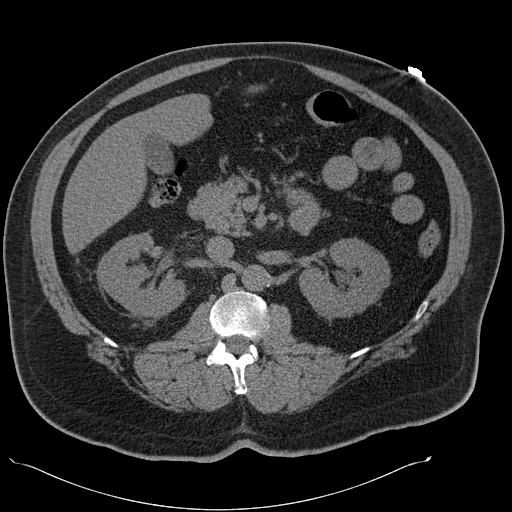
[im 109/164  bone]
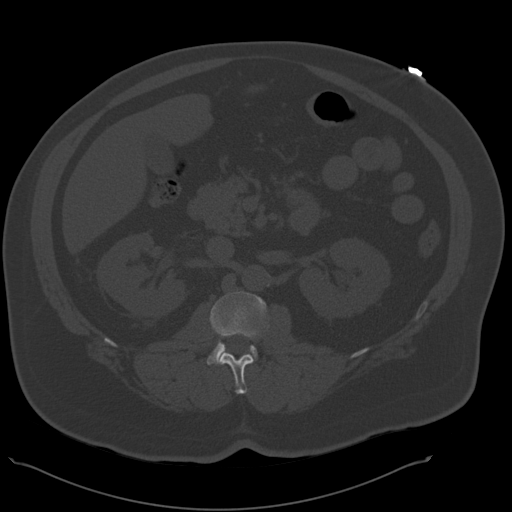
[im 121/164  soft-tissue]
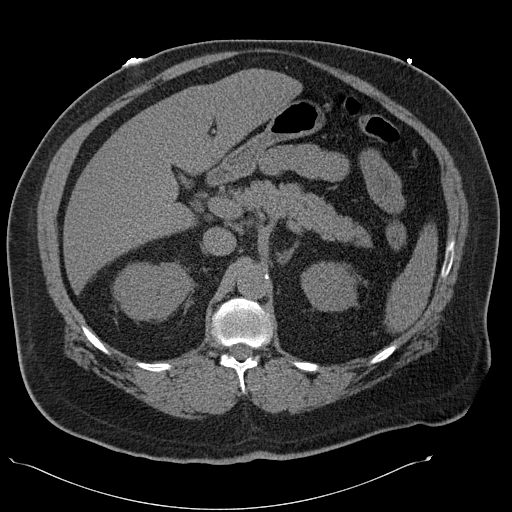
[im 133/164  soft-tissue]
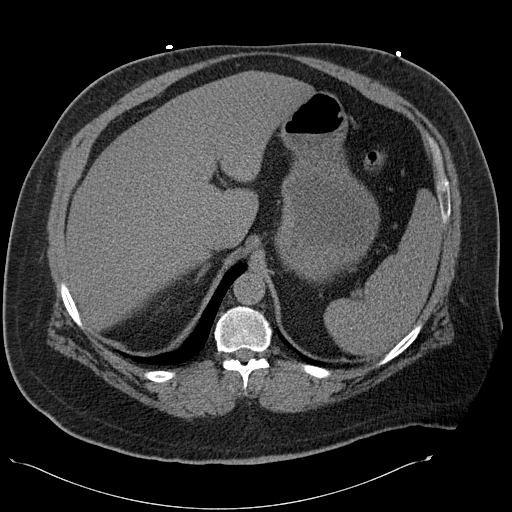
[im 139/164  lung]
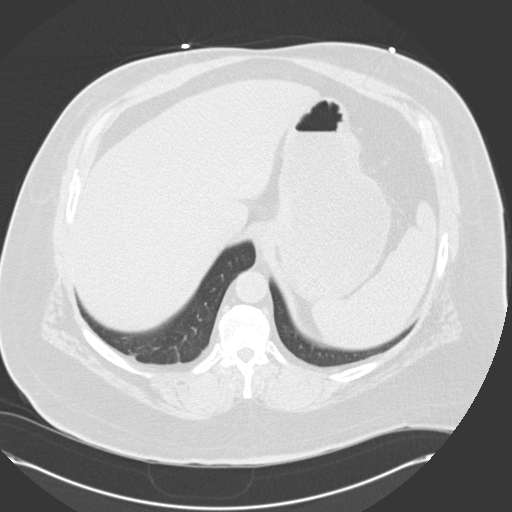
[im 145/164  soft-tissue]
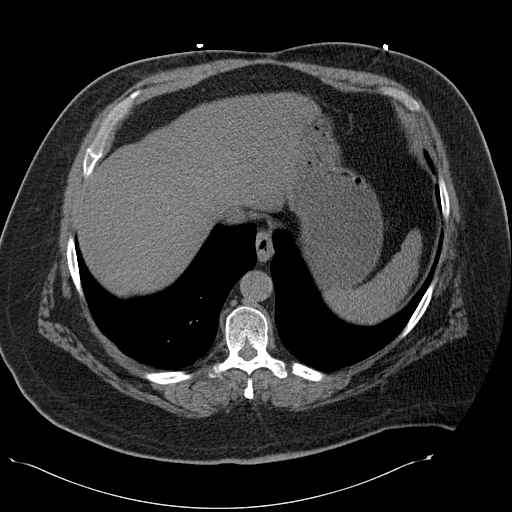
[im 145/164  lung]
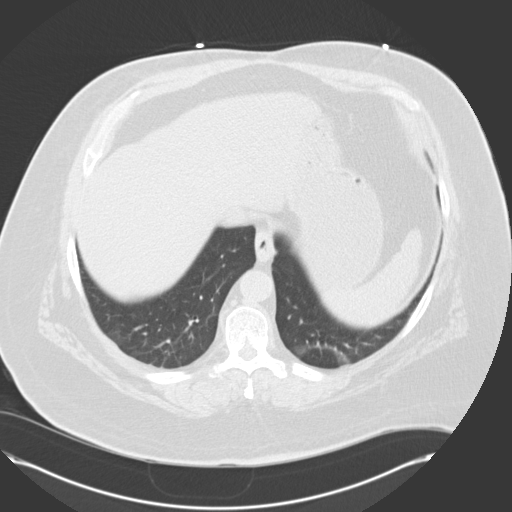
[im 151/164  lung]
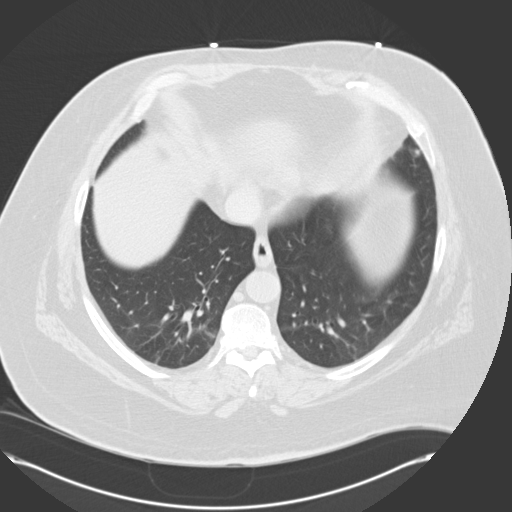
[im 157/164  soft-tissue]
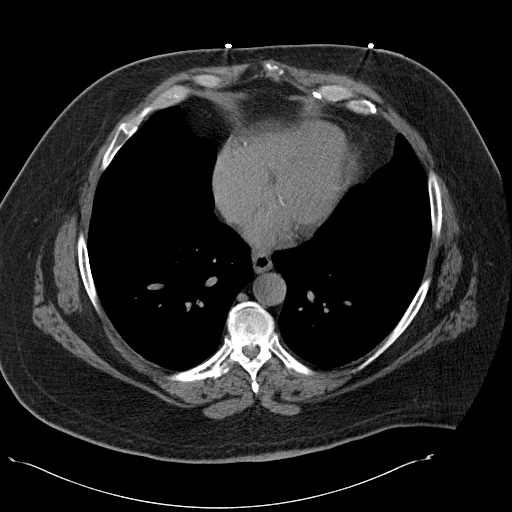
[im 157/164  lung]
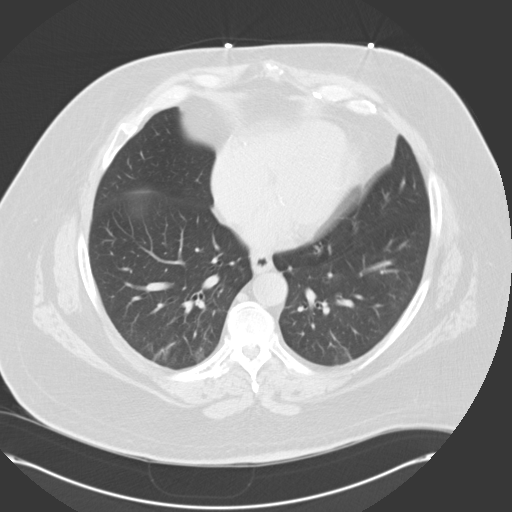

[15 of 32 positions shown; findings below may reference images not displayed]

FINDINGS: The lung bases are clear. There is no pleural or pericardial effusions.
Coronary artery calcifications are noted.

No renal, ureteral, or bladder calculi. There is no hydronephrosis. There is
bilateral perinephric stranding. There is mild right hydroureter with mild
periureteral inflammatory changes surrounding the proximal portion of the
right ureter.
There are no secondary signs of obstruction. No perinephric stranding is
seen. The kidneys are symmetric in size without evidence for exophytic mass.
The bladder is unremarkable.

The liver demonstrates no focal abnormality. The gallbladder is
unremarkable. The spleen demonstrates no focal abnormality. The adrenal
glands and pancreas are normal.

The unopacified stomach, duodenum, small intestine, and large intestine are
unremarkable, but evaluation is limited by lack of oral contrast. No normal
nor abnormal appendix is seen. There is no right lower quadrant inflammatory
change or free fluid. There is no pneumoperitoneum, pneumatosis, or portal
venous gas. There is no abdominal or pelvic free fluid. There is no
lymphadenopathy.

The abdominal aorta is normal in caliber.

The osseous structures are unremarkable.
IMPRESSION: 1. No urolithiasis or hydronephrosis. There is mild right hydroureter with
mild periureteral inflammatory changes surrounding the proximal portion of
the right ureter which may represent a recently passed calculus. Correlate
with urinalysis.

## 2010-01-24 ENCOUNTER — Ambulatory Visit: Payer: Self-pay | Admitting: Family Medicine

## 2010-08-01 ENCOUNTER — Ambulatory Visit: Payer: Self-pay | Admitting: Family Medicine

## 2010-08-06 ENCOUNTER — Ambulatory Visit: Payer: Self-pay | Admitting: Family Medicine

## 2010-09-10 ENCOUNTER — Ambulatory Visit: Payer: Self-pay | Admitting: Unknown Physician Specialty

## 2010-09-12 ENCOUNTER — Ambulatory Visit: Payer: Self-pay | Admitting: Unknown Physician Specialty

## 2010-12-13 ENCOUNTER — Ambulatory Visit: Payer: Self-pay | Admitting: Family Medicine

## 2011-03-07 ENCOUNTER — Ambulatory Visit: Payer: Self-pay | Admitting: Family Medicine

## 2011-05-18 ENCOUNTER — Inpatient Hospital Stay: Payer: Self-pay | Admitting: Internal Medicine

## 2011-05-26 ENCOUNTER — Emergency Department: Payer: Self-pay | Admitting: Unknown Physician Specialty

## 2011-09-21 IMAGING — CT CT ABDOMEN W/ CM
1 of 2 series · 15 of 32 positions shown, 20 images · IV contrast (isovue)
Comparison: none

REASON FOR EXAM: ABD PAIN GENERAL
COMMENTS:

PROCEDURE:     KCT - KCT ABDOMEN STANDARD W  - January 24, 2010  [DATE]
RESULT:     Comparison:  05/01/2008
TECHNIQUE: Multiple axial images of the abdomen, with p.o. contrast and with
100 ml of Isovue 370 intravenous contrast.

[Series 2: abd with 5.0 i40f · axial · 0.98mm/px · z∈[-1384,-1084]mm · 15 of 67 slices shown, 20 images]
[im 4/67  soft-tissue]
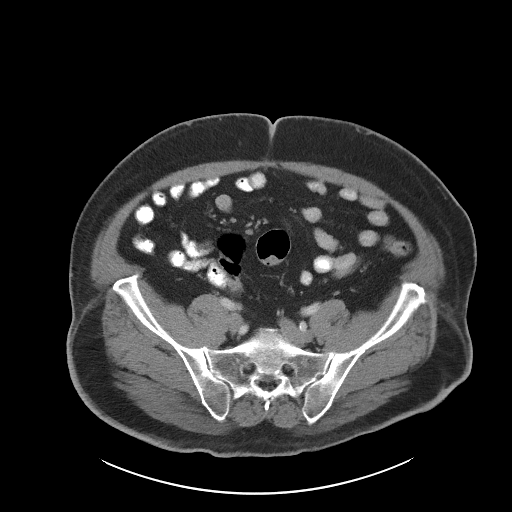
[im 4/67  bone]
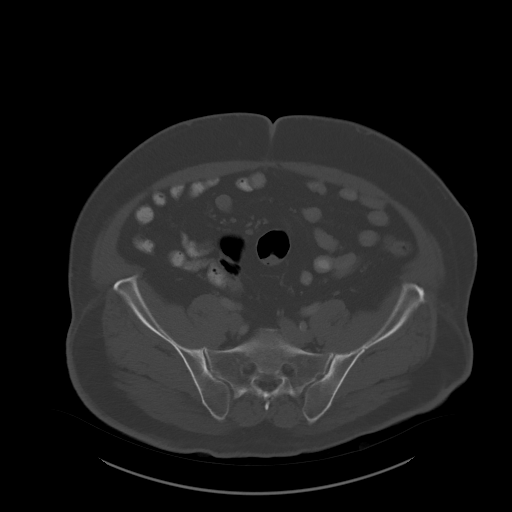
[im 10/67  soft-tissue]
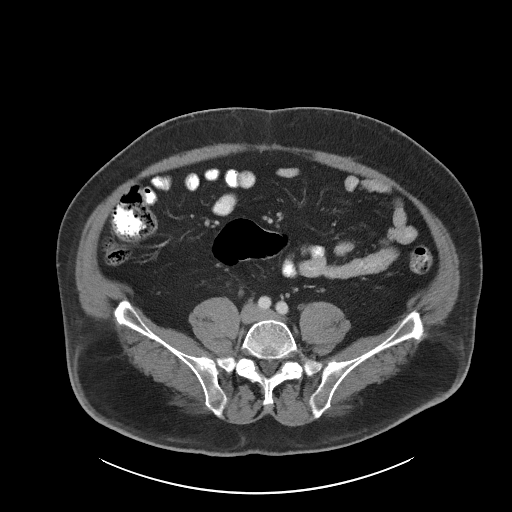
[im 13/67  soft-tissue]
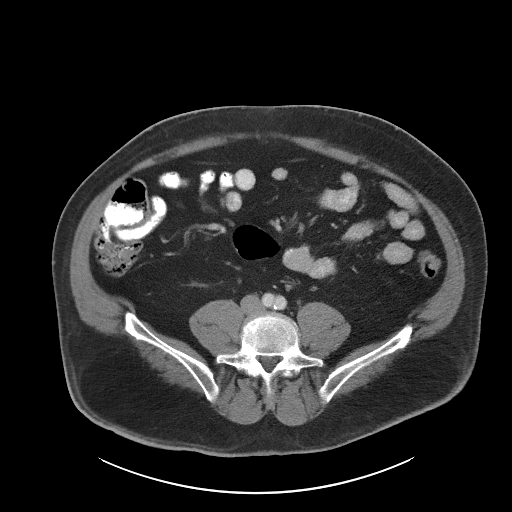
[im 19/67  soft-tissue]
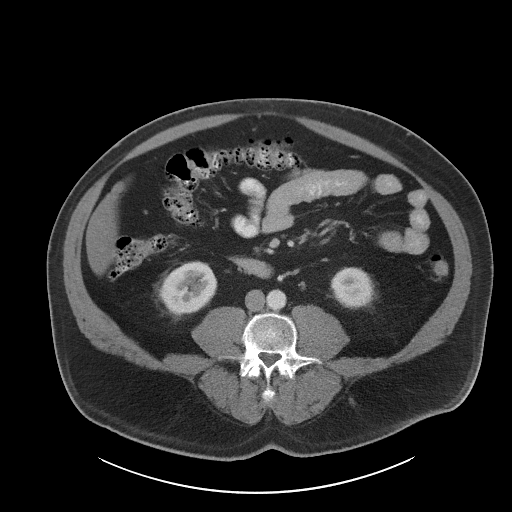
[im 22/67  soft-tissue]
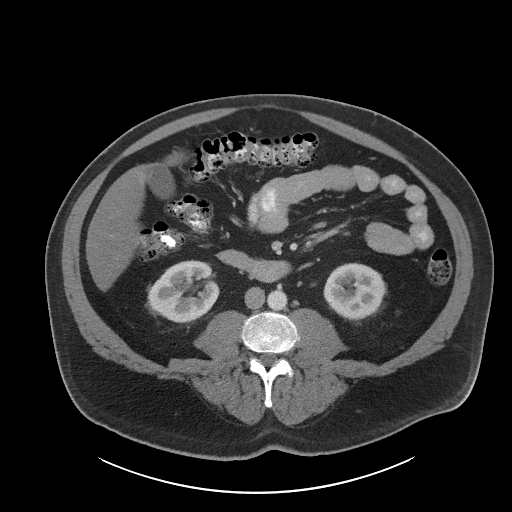
[im 28/67  soft-tissue]
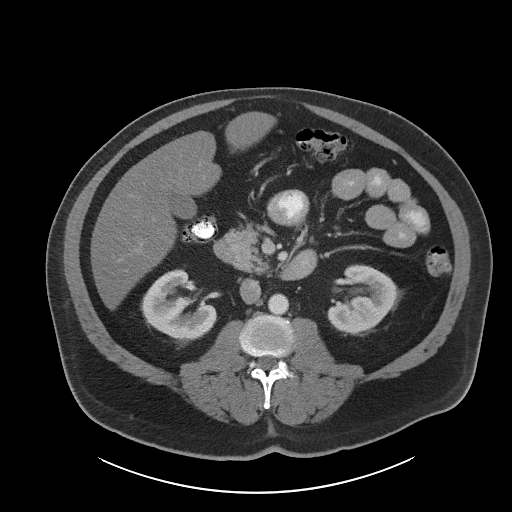
[im 31/67  soft-tissue]
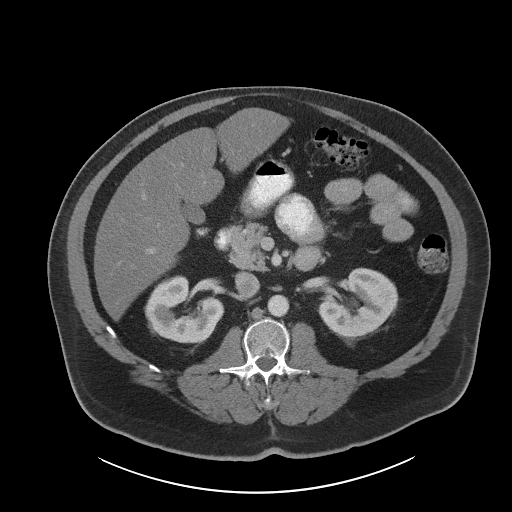
[im 37/67  soft-tissue]
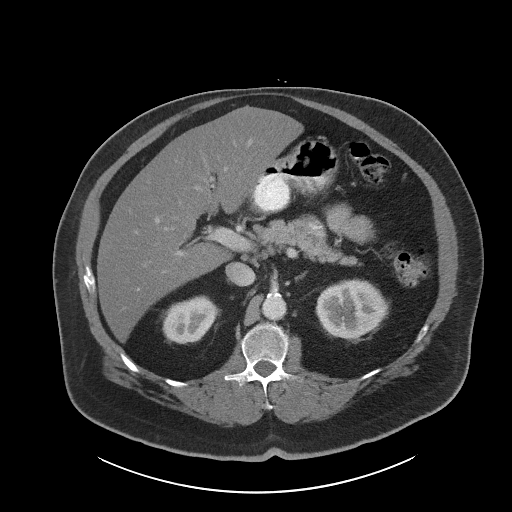
[im 40/67  soft-tissue]
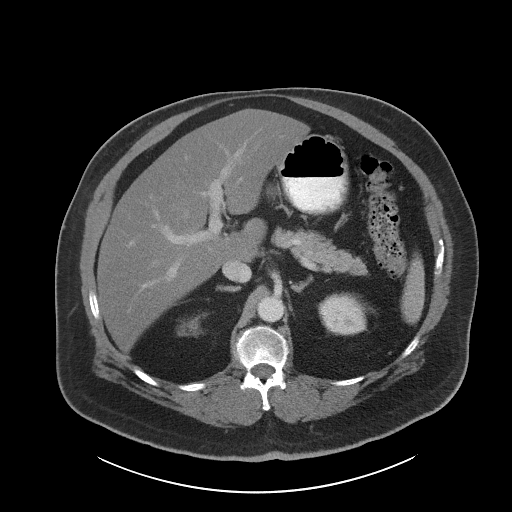
[im 40/67  bone]
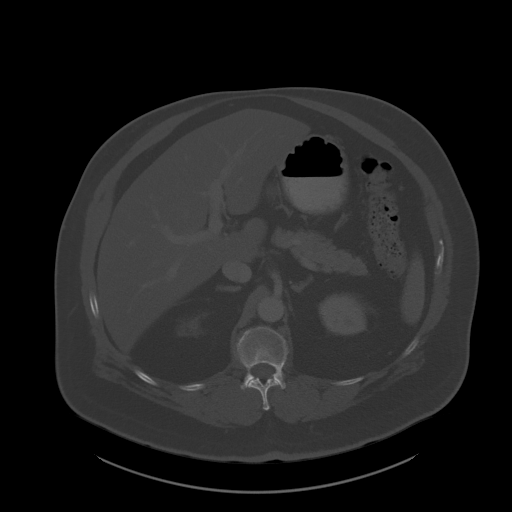
[im 46/67  soft-tissue]
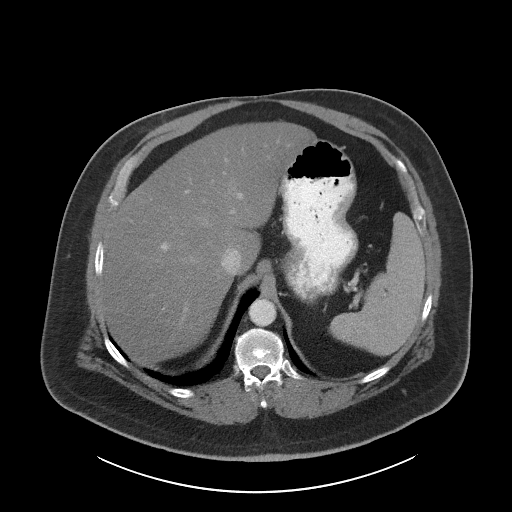
[im 49/67  soft-tissue]
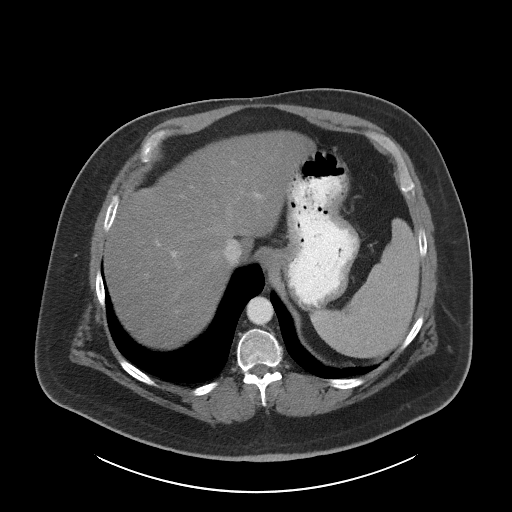
[im 55/67  soft-tissue]
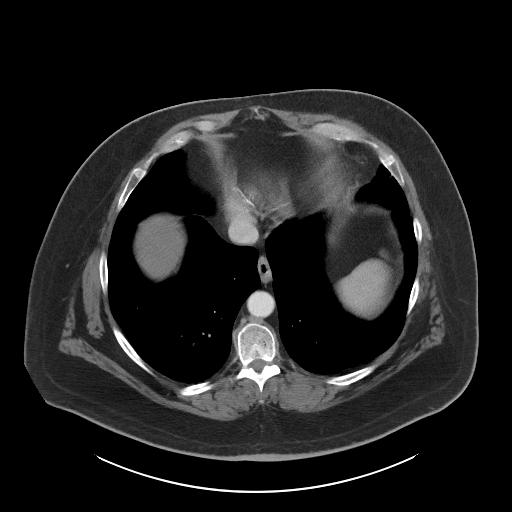
[im 55/67  lung]
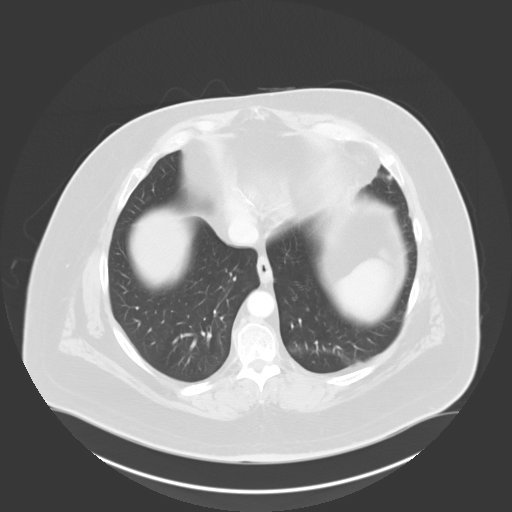
[im 58/67  soft-tissue]
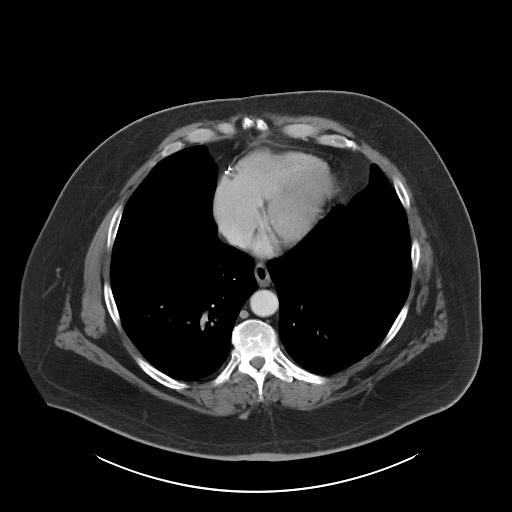
[im 58/67  lung]
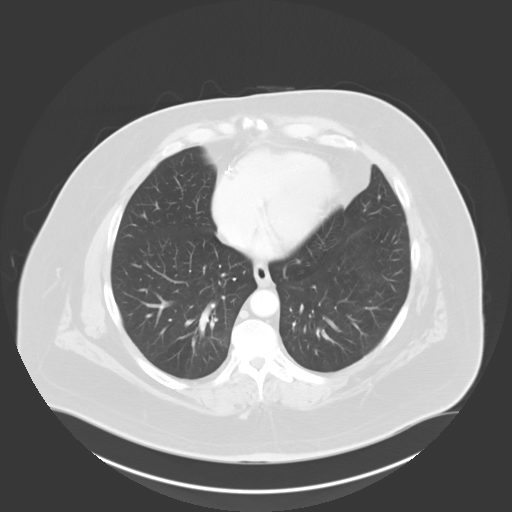
[im 61/67  lung]
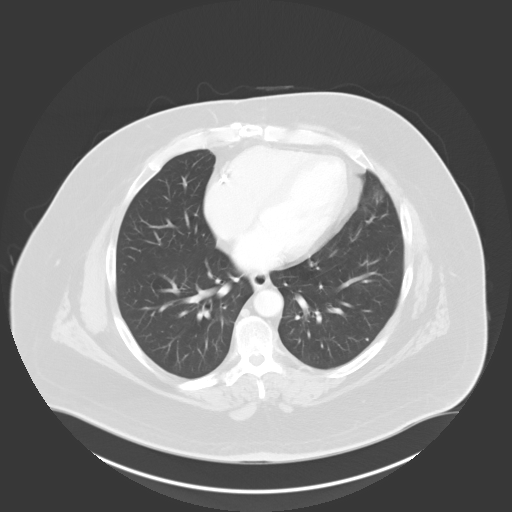
[im 64/67  soft-tissue]
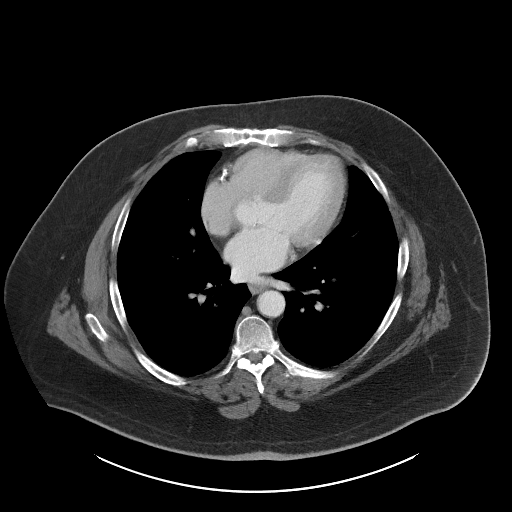
[im 64/67  lung]
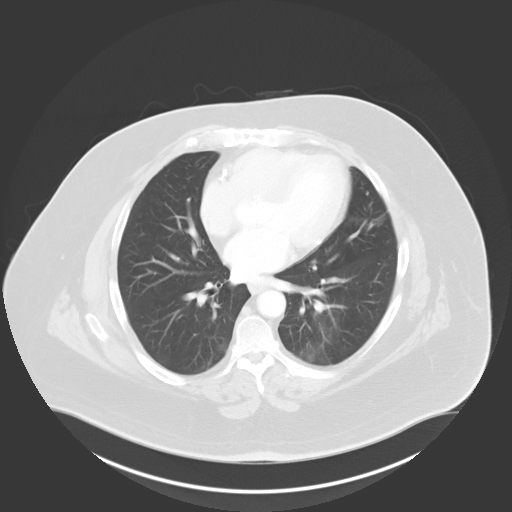

[15 of 32 positions shown; findings below may reference images not displayed]

FINDINGS: Tiny calcified nodule in the right lower lobe is unchanged. Tiny calcified
nodule in the left lower lobe is unchanged. There is a separate 2 mm nodule
in the left lower lobe likely calcified, image 2. Calcifications are seen in
the coronary arteries.

The liver is diffusely low in attenuation, consistent with hepatic
steatosis. The gallbladder, spleen, adrenals, and pancreas are unremarkable.
The kidneys enhance normally.

No aggressive lytic or sclerotic osseous lesions identified.
IMPRESSION: 1. No acute findings.
2. Hepatic steatosis.

## 2011-10-20 ENCOUNTER — Ambulatory Visit: Payer: Self-pay | Admitting: Internal Medicine

## 2011-12-03 ENCOUNTER — Other Ambulatory Visit: Payer: Self-pay | Admitting: Neurosurgery

## 2011-12-03 DIAGNOSIS — M5410 Radiculopathy, site unspecified: Secondary | ICD-10-CM

## 2011-12-03 DIAGNOSIS — M549 Dorsalgia, unspecified: Secondary | ICD-10-CM

## 2011-12-11 ENCOUNTER — Ambulatory Visit
Admission: RE | Admit: 2011-12-11 | Discharge: 2011-12-11 | Disposition: A | Discharge status: Home / Self Care | Payer: BC Managed Care – PPO | Source: Ambulatory Visit | Attending: Neurosurgery | Admitting: Neurosurgery

## 2011-12-11 DIAGNOSIS — M549 Dorsalgia, unspecified: Secondary | ICD-10-CM

## 2011-12-11 MED ORDER — GADOBENATE DIMEGLUMINE 529 MG/ML IV SOLN
20.0000 mL | Freq: Once | INTRAVENOUS | Status: AC | PRN
  Administered 2011-12-11: 20 mL via INTRAVENOUS

## 2011-12-26 ENCOUNTER — Other Ambulatory Visit: Payer: BC Managed Care – PPO

## 2012-02-18 ENCOUNTER — Other Ambulatory Visit: Payer: Self-pay | Admitting: Neurosurgery

## 2012-02-18 DIAGNOSIS — M7989 Other specified soft tissue disorders: Secondary | ICD-10-CM

## 2012-02-24 ENCOUNTER — Ambulatory Visit
Admission: RE | Admit: 2012-02-24 | Discharge: 2012-02-24 | Disposition: A | Discharge status: Home / Self Care | Payer: BC Managed Care – PPO | Source: Ambulatory Visit | Attending: Neurosurgery | Admitting: Neurosurgery

## 2012-02-24 DIAGNOSIS — M7989 Other specified soft tissue disorders: Secondary | ICD-10-CM

## 2012-02-24 MED ORDER — GADOBENATE DIMEGLUMINE 529 MG/ML IV SOLN
20.0000 mL | Freq: Once | INTRAVENOUS | Status: AC | PRN
  Administered 2012-02-24: 20 mL via INTRAVENOUS

## 2012-03-16 ENCOUNTER — Other Ambulatory Visit: Payer: Self-pay | Admitting: Neurosurgery

## 2012-03-16 DIAGNOSIS — M549 Dorsalgia, unspecified: Secondary | ICD-10-CM

## 2012-03-19 ENCOUNTER — Ambulatory Visit
Admission: RE | Admit: 2012-03-19 | Discharge: 2012-03-19 | Disposition: A | Discharge status: Home / Self Care | Payer: BC Managed Care – PPO | Source: Ambulatory Visit | Attending: Neurosurgery | Admitting: Neurosurgery

## 2012-03-19 VITALS — BP 140/74 | HR 84

## 2012-03-19 DIAGNOSIS — M549 Dorsalgia, unspecified: Secondary | ICD-10-CM

## 2012-03-19 MED ORDER — ONDANSETRON HCL 4 MG/2ML IJ SOLN
4.0000 mg | Freq: Once | INTRAMUSCULAR | Status: AC
  Administered 2012-03-19: 4 mg via INTRAMUSCULAR

## 2012-03-19 MED ORDER — MEPERIDINE HCL 100 MG/ML IJ SOLN
75.0000 mg | Freq: Once | INTRAMUSCULAR | Status: AC
  Administered 2012-03-19: 75 mg via INTRAMUSCULAR

## 2012-03-19 MED ORDER — IOHEXOL 180 MG/ML  SOLN
15.0000 mL | Freq: Once | INTRAMUSCULAR | Status: AC | PRN
  Administered 2012-03-19: 15 mL via INTRATHECAL

## 2012-03-19 MED ORDER — DIAZEPAM 5 MG PO TABS
10.0000 mg | ORAL_TABLET | Freq: Once | ORAL | Status: AC
  Administered 2012-03-19: 10 mg via ORAL

## 2012-03-28 IMAGING — CR DG LUMBAR SPINE 2-3V
1 series · 3 of 3 positions shown · non-contrast
Comparison: none

REASON FOR EXAM: back pain
COMMENTS:

[Series 1: view not recorded · 0.17mm/px · 3 of 3 slices shown]
[im 1/3]
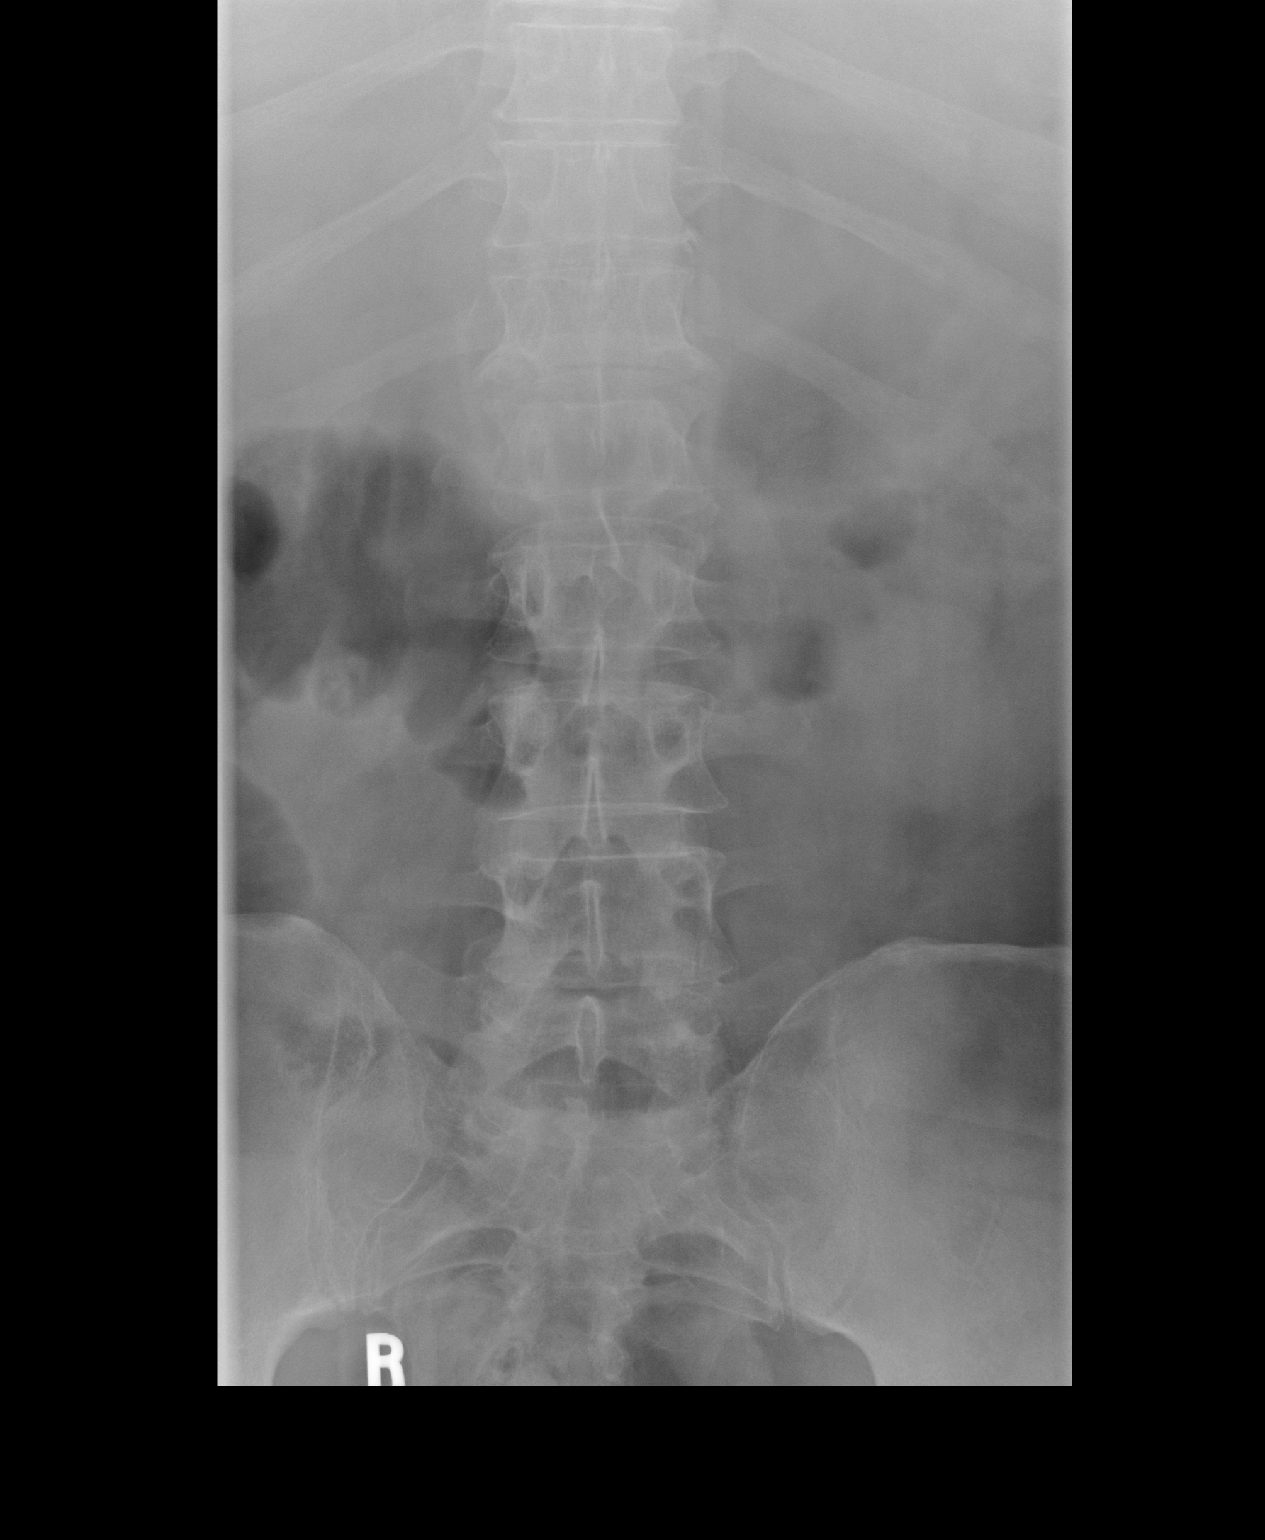
[im 2/3]
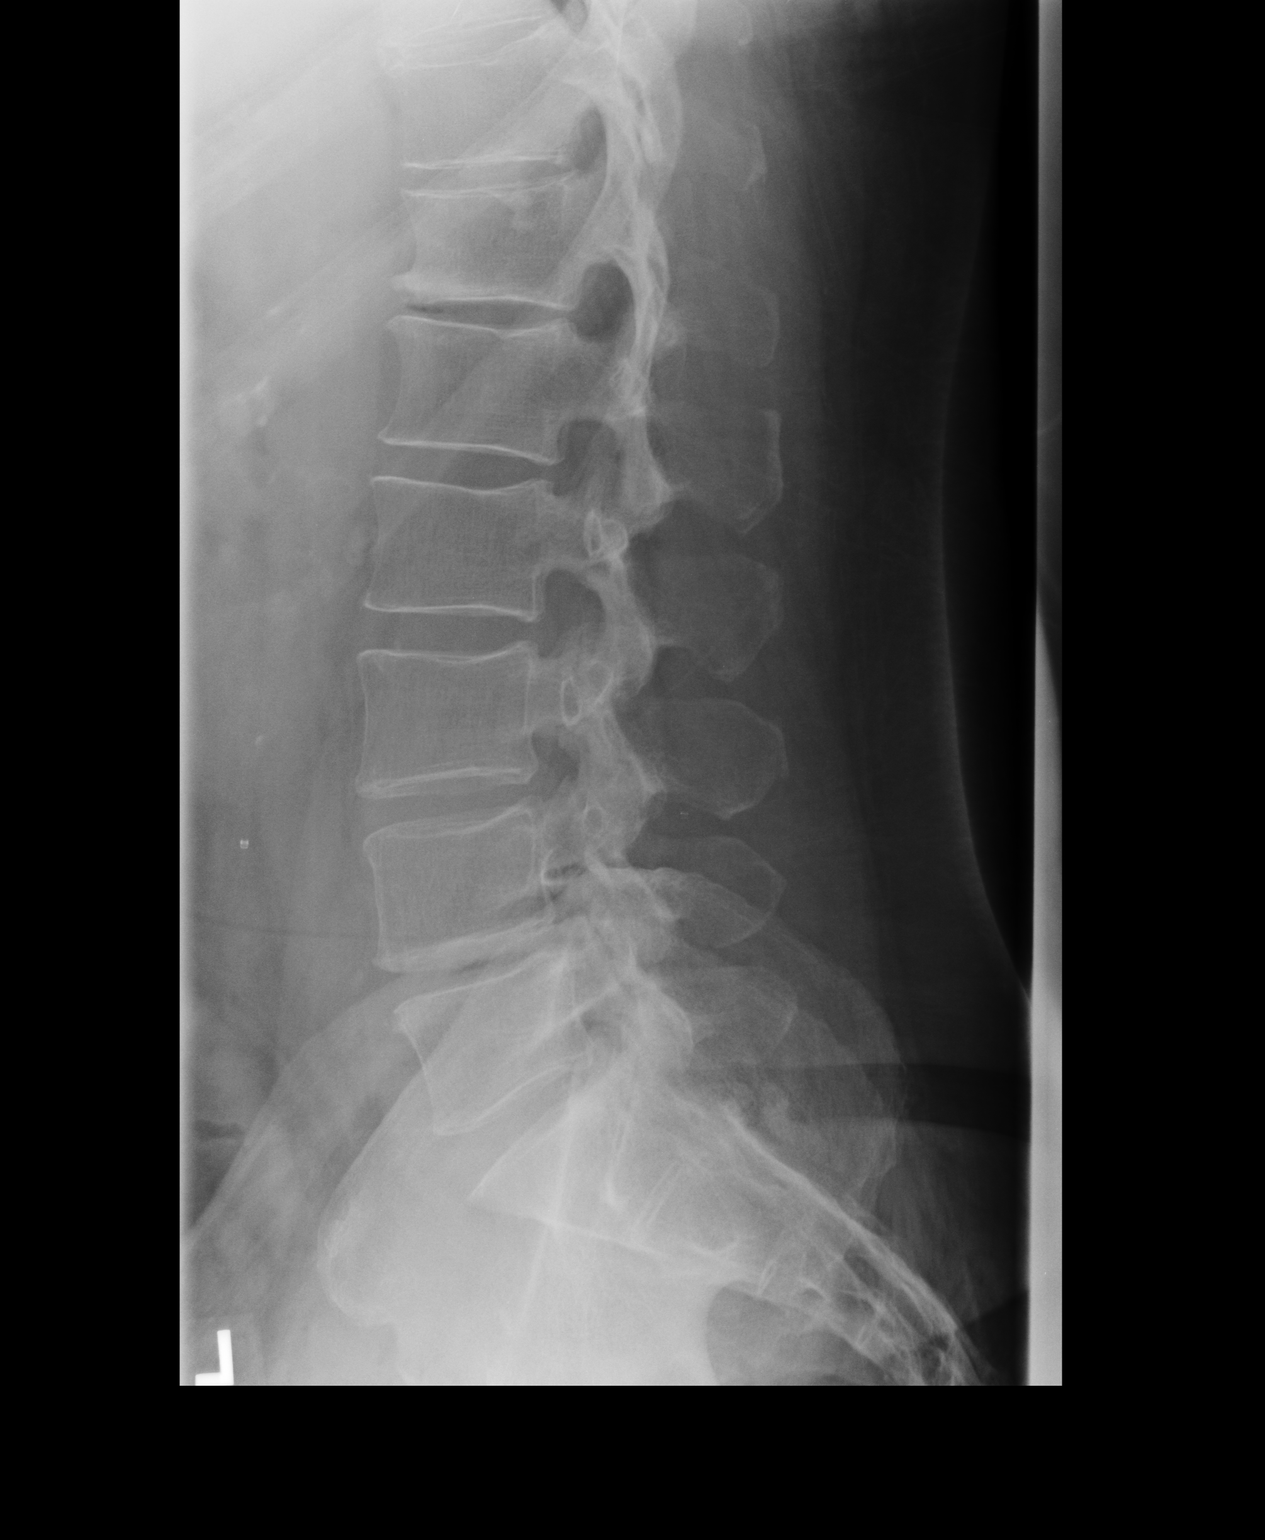
[im 3/3]
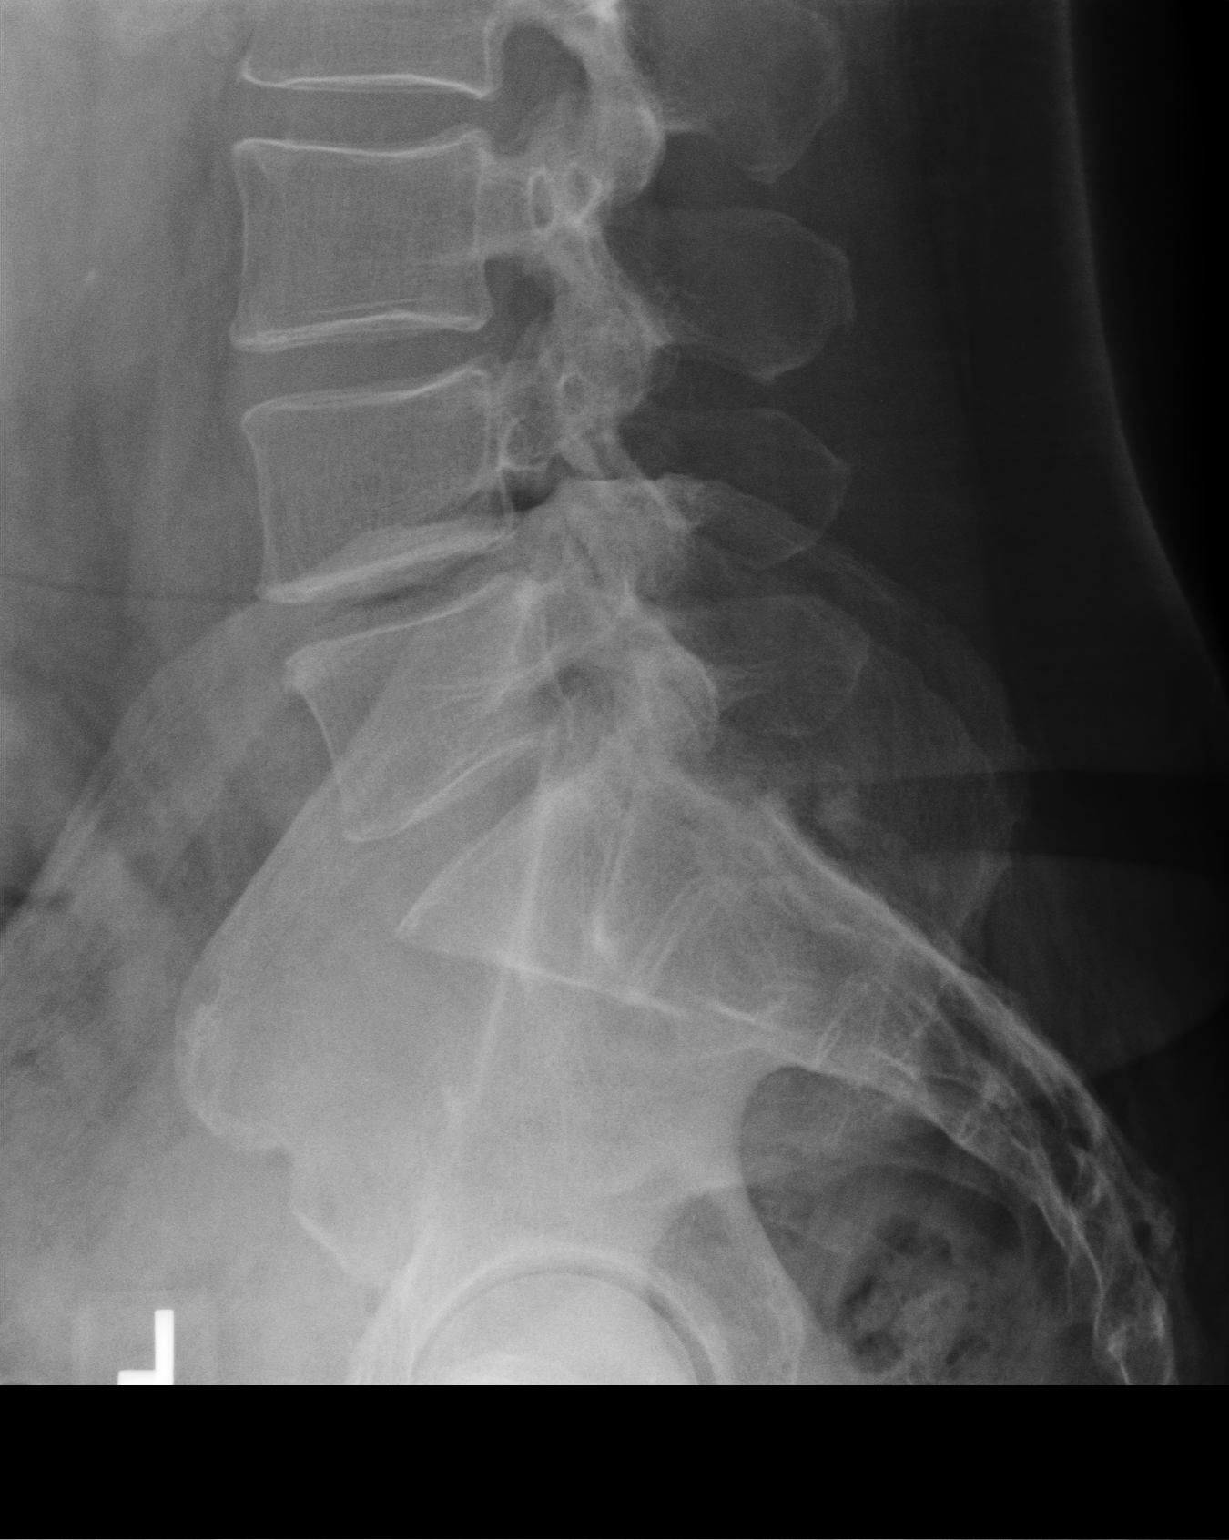

[3 of 3 positions shown; findings below may reference images not displayed]

PROCEDURE:     KDR - KDXR LUMBAR SPINE AP AND LATERAL  - August 01, 2010  [DATE]

RESULT:     The vertebral body heights are well maintained. The vertebral
body alignment is normal. There is narrowing of the L4-5 intervertebral disc
space consistent with disc disease. There is a small amount of gas also
observed in the L4-5 intervertebral disc space which also is consistent with
disc disease. The intervertebral disc spaces otherwise are well maintained.
The pedicles are bilaterally intact.
IMPRESSION: 1.  No fracture is seen.
2.  There is narrowing of the L4-5 intervertebral disc space consistent with
disc disease. This could be further evaluated by MR, if clinically indicated.

## 2012-03-28 IMAGING — CR CERVICAL SPINE - 2-3 VIEW
1 series · 4 of 4 positions shown · non-contrast
Comparison: none

REASON FOR EXAM: neck pain
COMMENTS:

PROCEDURE:     KDR - KDXR C-SPINE AP AND LATERAL  - August 01, 2010  [DATE]
RESULT:     The vertebral body heights and the intervertebral disc spaces
are well maintained. The vertebral body alignment is normal. The odontoid
process is intact.

[Series 1: view not recorded · 0.17mm/px · 4 of 4 slices shown]
[im 1/4]
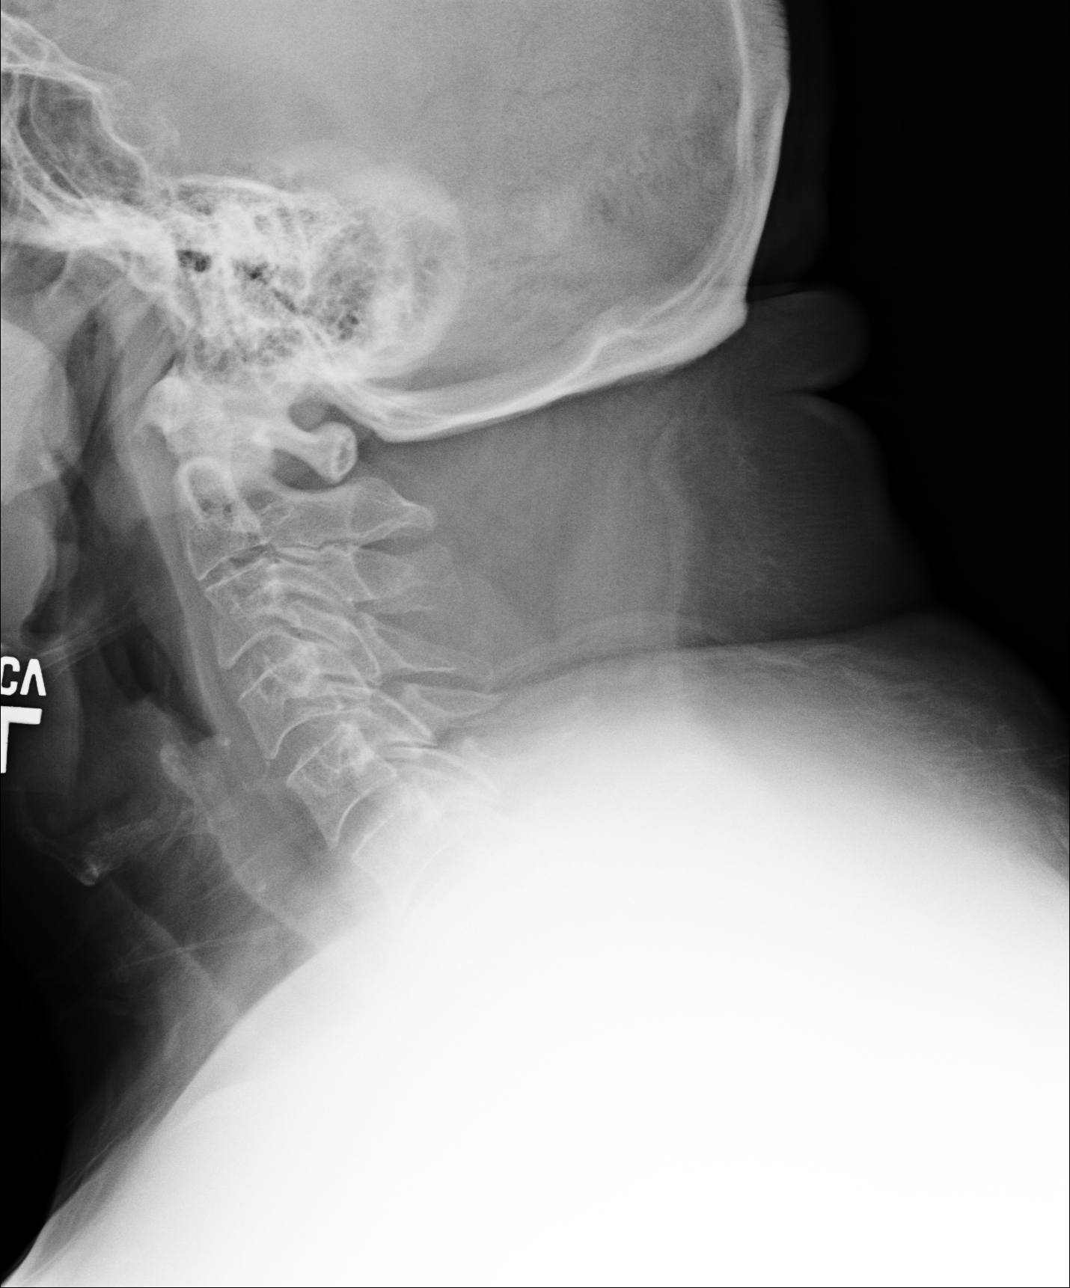
[im 2/4]
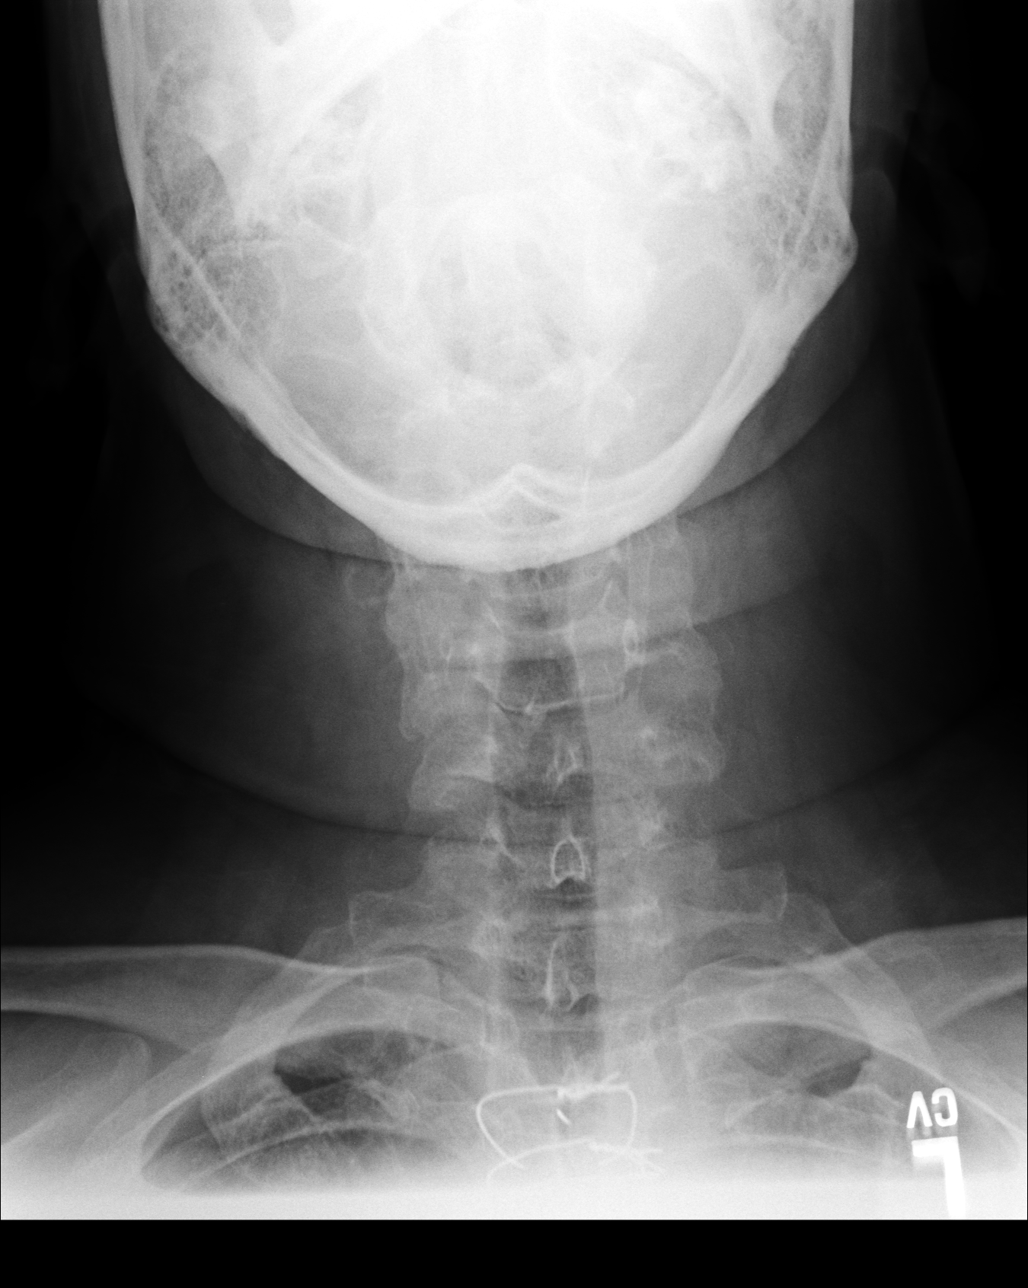
[im 3/4]
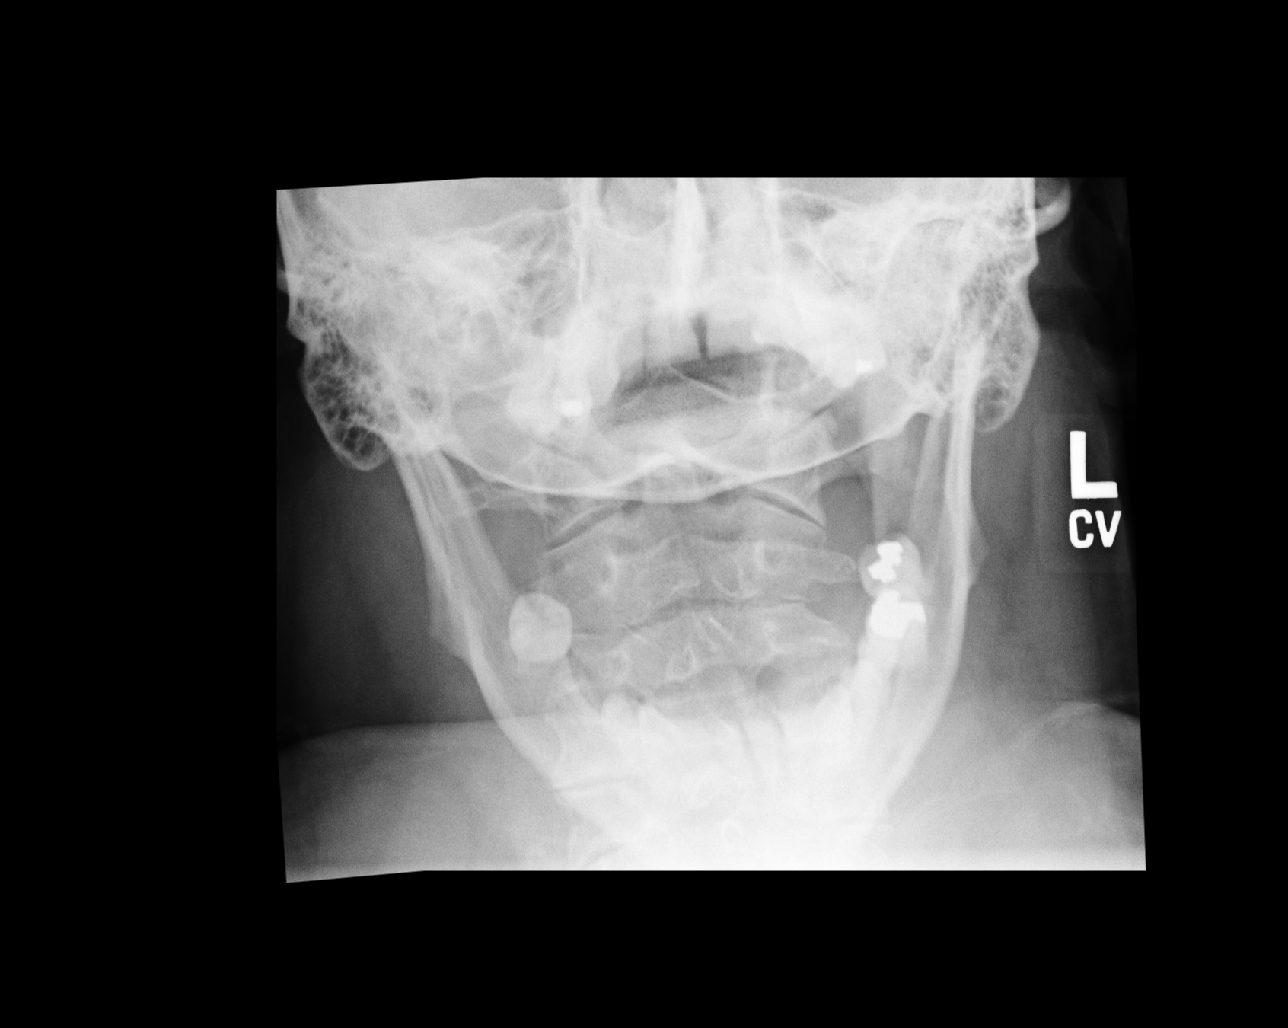
[im 4/4]
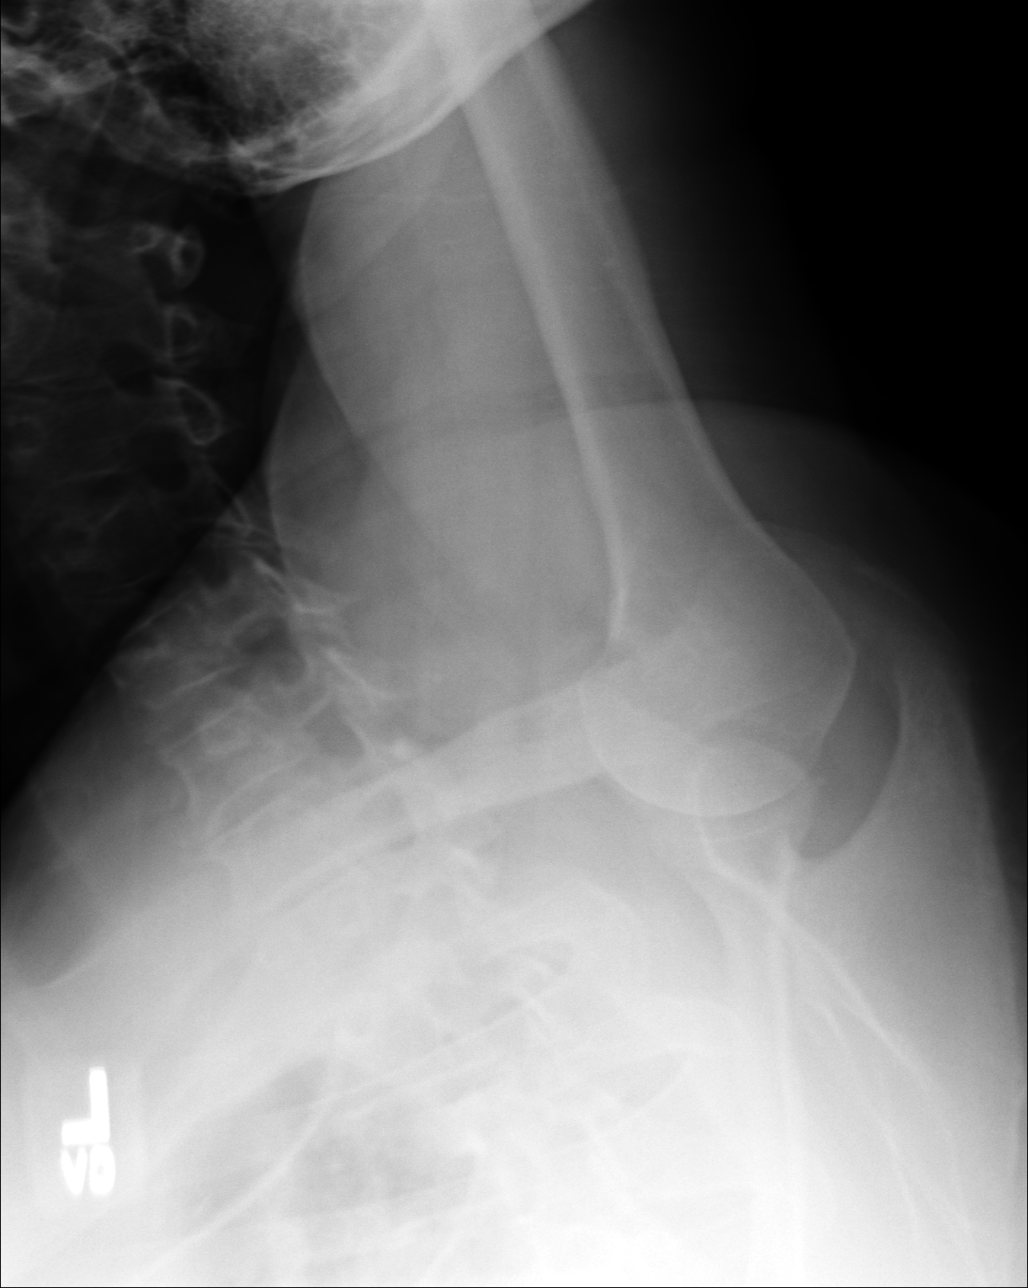

[4 of 4 positions shown; findings below may reference images not displayed]

IMPRESSION: No acute changes are identified.

## 2012-04-02 IMAGING — US ABDOMEN ULTRASOUND
1 series · 17 of 25 positions shown · non-contrast
Comparison: none

REASON FOR EXAM: abdominal pain
COMMENTS:

[Series 1: abdomen ultrasound · 17 of 104 slices shown]
[im 1/104]
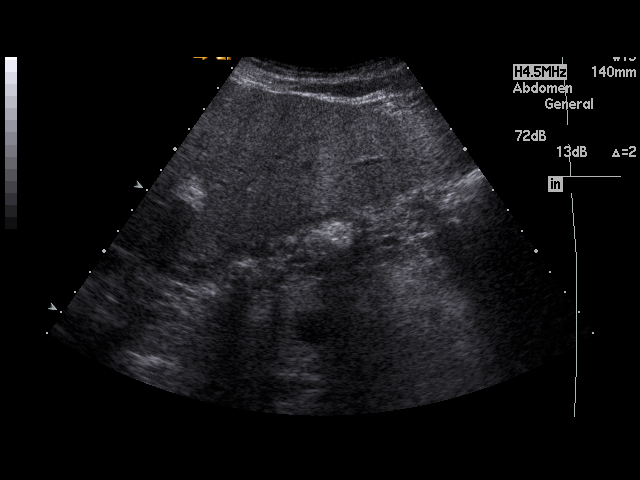
[im 9/104]
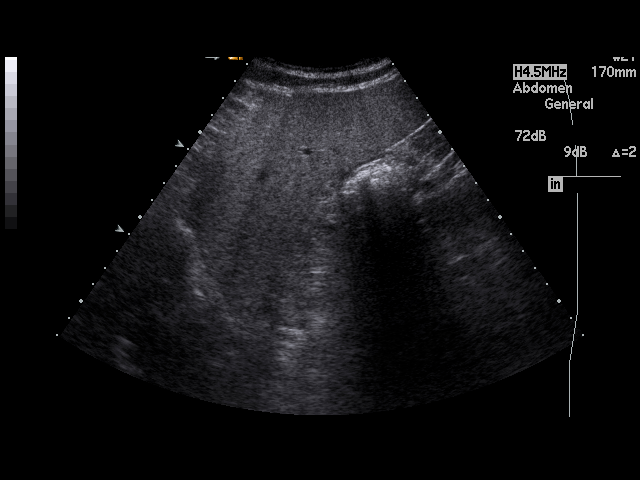
[im 13/104]
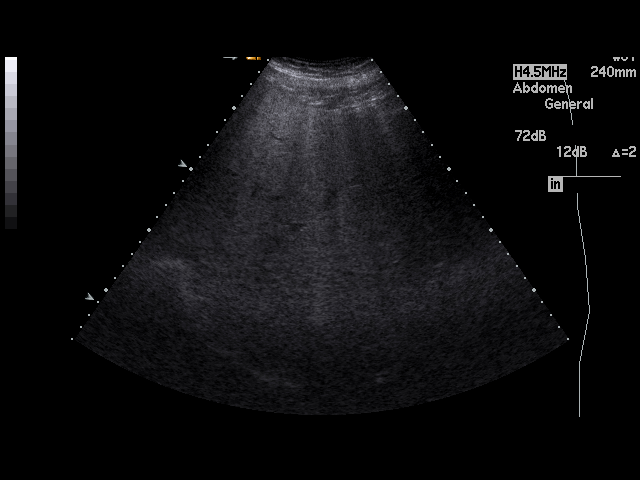
[im 22/104]
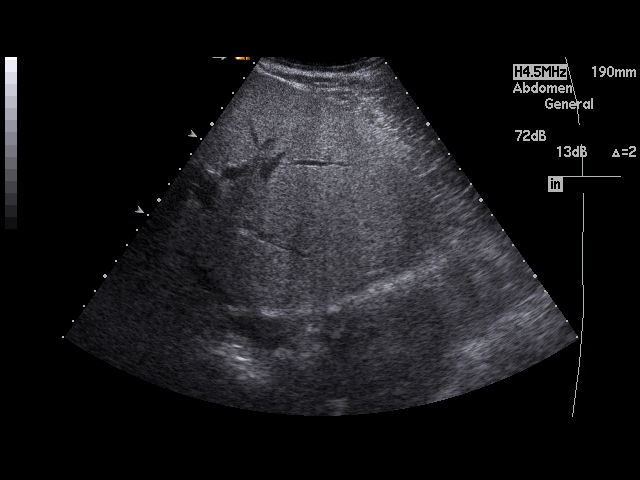
[im 26/104]
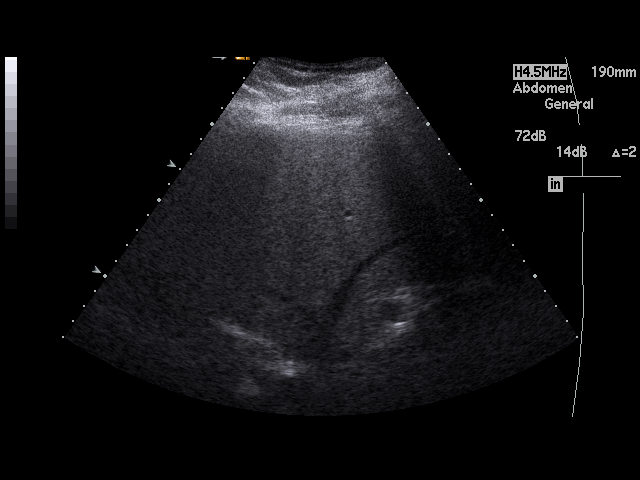
[im 35/104]
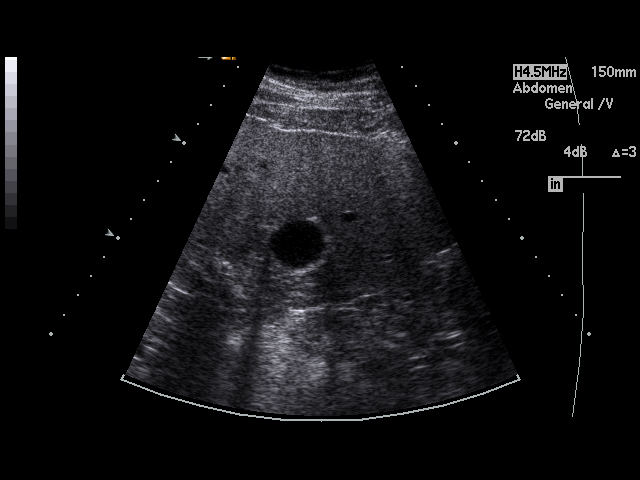
[im 39/104]
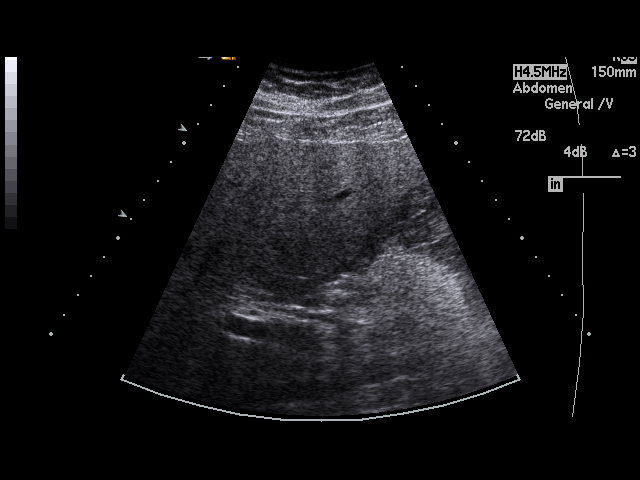
[im 48/104]
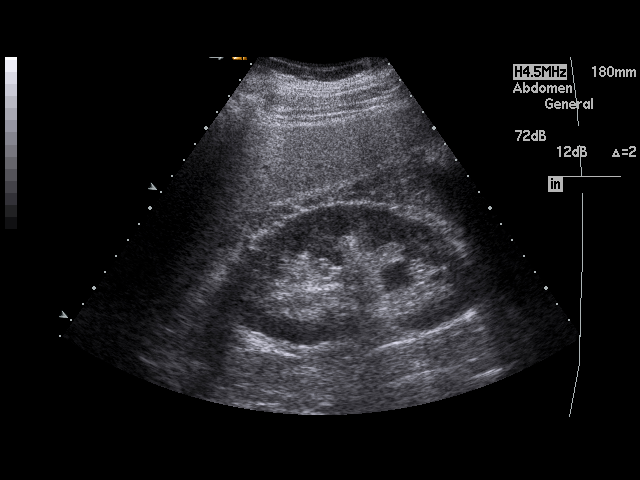
[im 52/104]
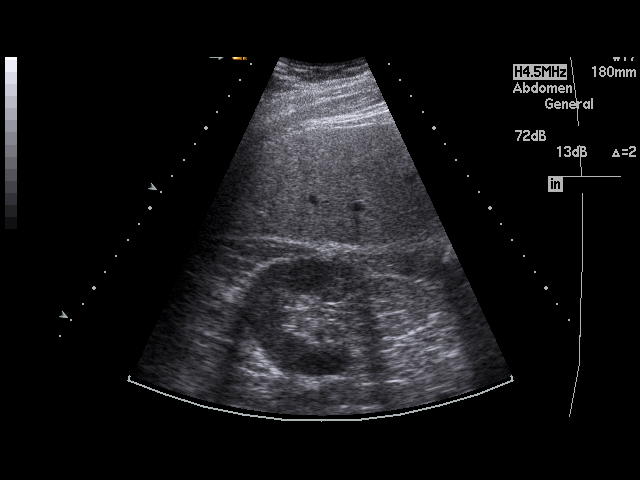
[im 56/104]
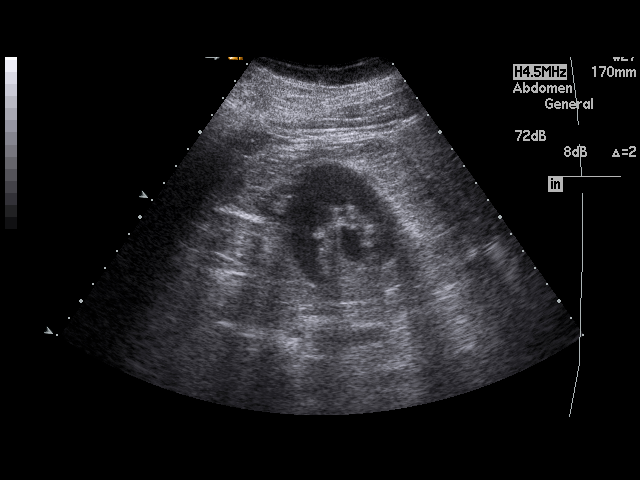
[im 65/104]
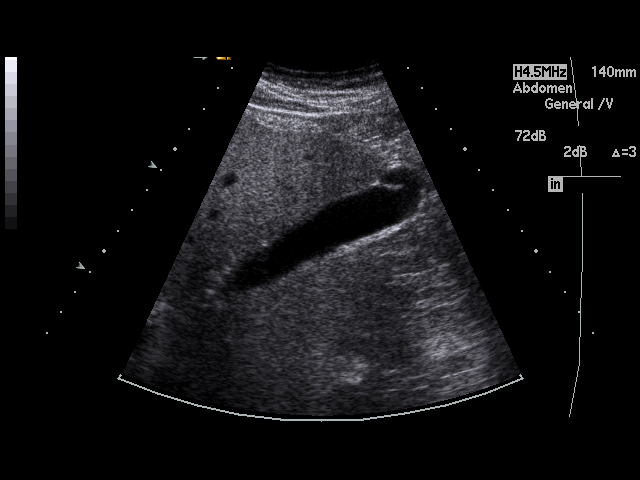
[im 69/104]
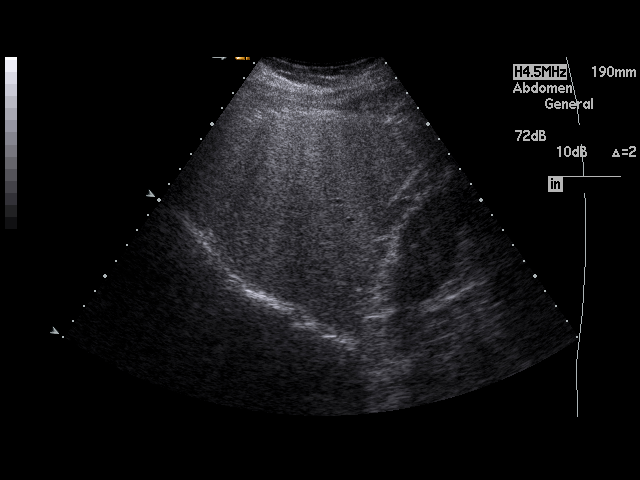
[im 78/104]
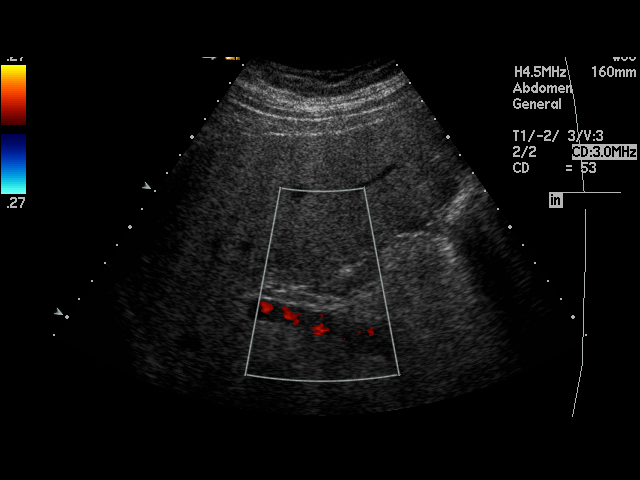
[im 82/104]
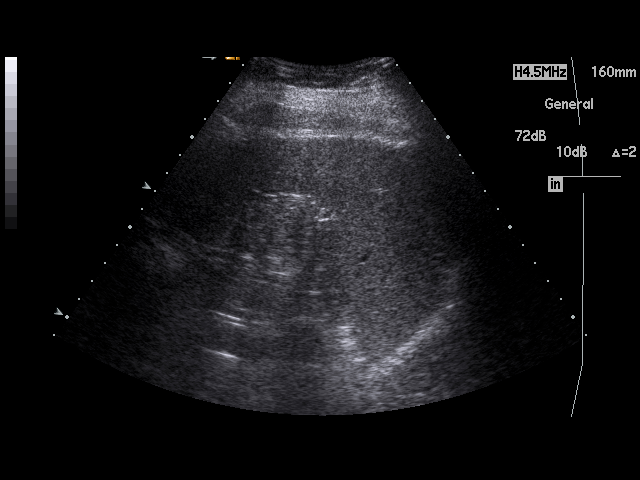
[im 91/104]
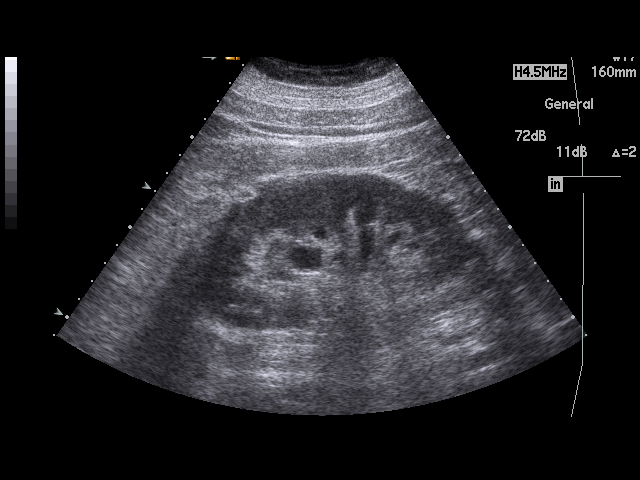
[im 95/104]
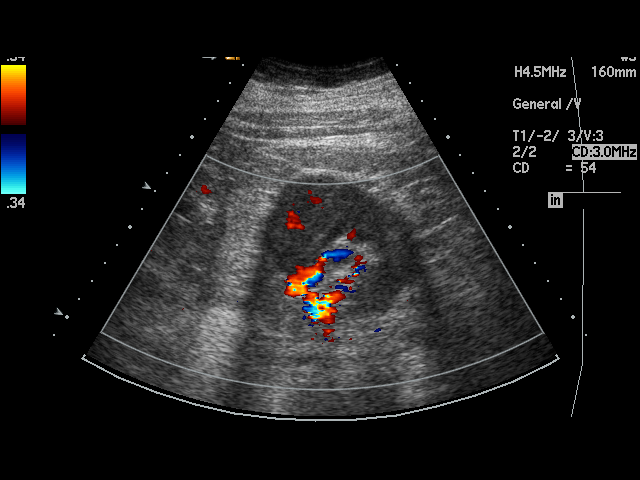
[im 104/104]
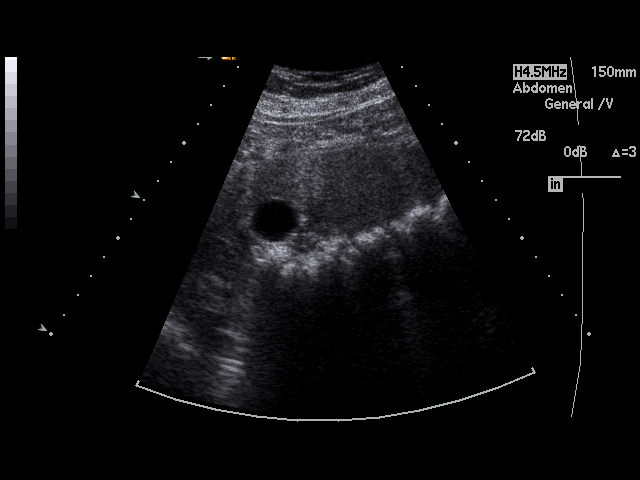

[17 of 25 positions shown; findings below may reference images not displayed]

PROCEDURE:     US  - US ABDOMEN GENERAL SURVEY  - August 06, 2010  [DATE]

RESULT:     Comparison: None.

Technique and Findings:
Multiple grayscale and color Doppler images obtained of the abdomen.

The visualized liver and spleen are unremarkable. Portal vein is patent. The
pancreas is not well visualized secondary to obscuration by overlying bowel
gas. No gallstones, gallbladder wall thickening, or pericholecystic fluid.
The sonographic Murphy sign is negative. The common bile duct measures 3-4
mm.

The right kidney measures 13.8 cm and the left kidney measured 13.0 cm.
There is minimal prominence of the bilateral renal collecting systems,
without definite hydronephrosis.
IMPRESSION: 1. No acute findings. Normal gallbladder.
2. Minimal prominence of the bilateral renal collecting systems, without
definite hydronephrosis.

## 2012-04-21 ENCOUNTER — Observation Stay: Payer: Self-pay | Admitting: Internal Medicine

## 2012-04-21 LAB — URINALYSIS, COMPLETE
Bacteria: NONE SEEN
Bilirubin,UR: NEGATIVE
Ketone: NEGATIVE
Nitrite: NEGATIVE
Ph: 6 (ref 4.5–8.0)
Protein: NEGATIVE
Specific Gravity: 1.007 (ref 1.003–1.030)

## 2012-04-21 LAB — COMPREHENSIVE METABOLIC PANEL
Albumin: 3.7 g/dL (ref 3.4–5.0)
Alkaline Phosphatase: 71 U/L (ref 50–136)
Anion Gap: 8 (ref 7–16)
BUN: 17 mg/dL (ref 7–18)
Chloride: 99 mmol/L (ref 98–107)
Co2: 27 mmol/L (ref 21–32)
Creatinine: 0.82 mg/dL (ref 0.60–1.30)
EGFR (African American): >60
Glucose: 184 mg/dL — ABNORMAL HIGH (ref 65–99)
Potassium: 3.7 mmol/L (ref 3.5–5.1)
Sodium: 134 mmol/L — ABNORMAL LOW (ref 136–145)
Total Protein: 7.8 g/dL (ref 6.4–8.2)

## 2012-04-21 LAB — CBC
HGB: 15 g/dL (ref 13.0–18.0)
MCH: 30.5 pg (ref 26.0–34.0)
MCV: 88 fL (ref 80–100)
Platelet: 160 10*3/uL (ref 150–440)
RBC: 4.91 10*6/uL (ref 4.40–5.90)

## 2012-04-21 LAB — DIFFERENTIAL
Basophil #: 0.1 10*3/uL (ref 0.0–0.1)
Basophil %: 0.5 %
Eosinophil #: 0.1 10*3/uL (ref 0.0–0.7)
Eosinophil %: 0.6 %
Lymphocyte #: 0.8 10*3/uL — ABNORMAL LOW (ref 1.0–3.6)
Lymphocyte %: 4.4 %
Monocyte #: 1.1 x10 3/mm — ABNORMAL HIGH (ref 0.2–1.0)
Neutrophil #: 15.9 10*3/uL — ABNORMAL HIGH (ref 1.4–6.5)
Neutrophil %: 88.2 %

## 2012-04-21 LAB — TROPONIN I: Troponin-I: <0.02 ng/mL

## 2012-04-21 LAB — CK TOTAL AND CKMB (NOT AT ARMC): CK-MB: 3 ng/mL (ref 0.5–3.6)

## 2012-04-21 LAB — RAPID INFLUENZA A&B ANTIGENS

## 2012-04-22 LAB — BASIC METABOLIC PANEL
BUN: 15 mg/dL (ref 7–18)
Calcium, Total: 8.8 mg/dL (ref 8.5–10.1)
Co2: 23 mmol/L (ref 21–32)
Creatinine: 0.74 mg/dL (ref 0.60–1.30)
EGFR (African American): >60
Potassium: 3.7 mmol/L (ref 3.5–5.1)
Sodium: 137 mmol/L (ref 136–145)

## 2012-04-22 LAB — CBC WITH DIFFERENTIAL/PLATELET
Basophil #: 0.1 x10 3/mm 3
Basophil %: 0.6 %
Eosinophil #: 0 x10 3/mm 3
Eosinophil %: 0.4 %
HCT: 41.9 %
HGB: 14.4 g/dL
Lymphocyte %: 6.1 %
Lymphs Abs: 0.6 x10 3/mm 3 — ABNORMAL LOW
MCH: 30.3 pg
MCHC: 34.4 g/dL
MCV: 88 fL
Monocyte #: 0.7 "x10 3/mm "
Monocyte %: 6.9 %
Neutrophil #: 8.5 x10 3/mm 3 — ABNORMAL HIGH
Neutrophil %: 86 %
Platelet: 144 x10 3/mm 3 — ABNORMAL LOW
RBC: 4.76 x10 6/mm 3
RDW: 13.3 %
WBC: 9.9 x10 3/mm 3

## 2012-04-23 LAB — CBC WITH DIFFERENTIAL/PLATELET
Basophil #: 0.1 10*3/uL (ref 0.0–0.1)
Basophil %: 2 %
HGB: 13.9 g/dL (ref 13.0–18.0)
Lymphocyte #: 1.4 10*3/uL (ref 1.0–3.6)
MCH: 30.8 pg (ref 26.0–34.0)
MCHC: 34.8 g/dL (ref 32.0–36.0)
MCV: 89 fL (ref 80–100)
Monocyte #: 0.7 x10 3/mm (ref 0.2–1.0)
Neutrophil %: 48 %
Platelet: 135 10*3/uL — ABNORMAL LOW (ref 150–440)
RBC: 4.52 10*6/uL (ref 4.40–5.90)
RDW: 13.3 % (ref 11.5–14.5)

## 2012-04-23 LAB — BASIC METABOLIC PANEL
BUN: 14 mg/dL (ref 7–18)
Co2: 23 mmol/L (ref 21–32)
EGFR (Non-African Amer.): >60
Glucose: 190 mg/dL — ABNORMAL HIGH (ref 65–99)
Osmolality: 285 (ref 275–301)
Potassium: 4 mmol/L (ref 3.5–5.1)

## 2012-04-23 LAB — MAGNESIUM: Magnesium: 1.9 mg/dL

## 2012-05-07 IMAGING — CT CT ABD-PELV W/ CM
1 of 3 series · 14 of 32 positions shown, 19 images · non-contrast
Comparison: none

REASON FOR EXAM: STAT CR [id number] ask for Tsugu Karel LUQ pain enlarged
spleen and liver
COMMENTS:

[Series 2: abd with 5.0 i40f · axial · 0.98mm/px · z∈[-1091,-691]mm · 14 of 91 slices shown, 19 images]
[im 6/91  soft-tissue]
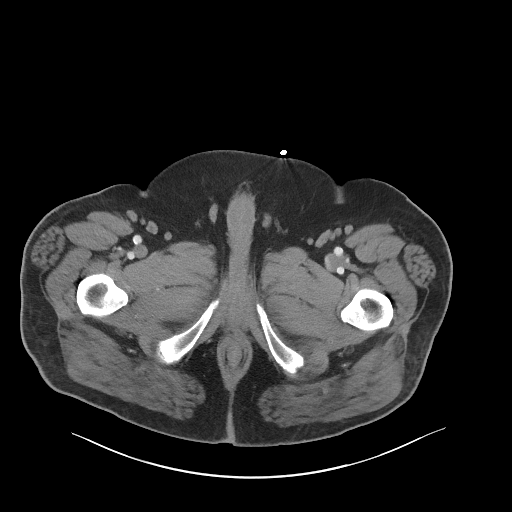
[im 6/91  bone]
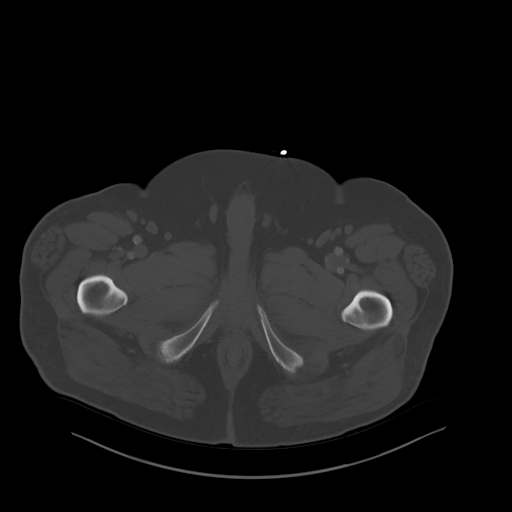
[im 11/91  soft-tissue]
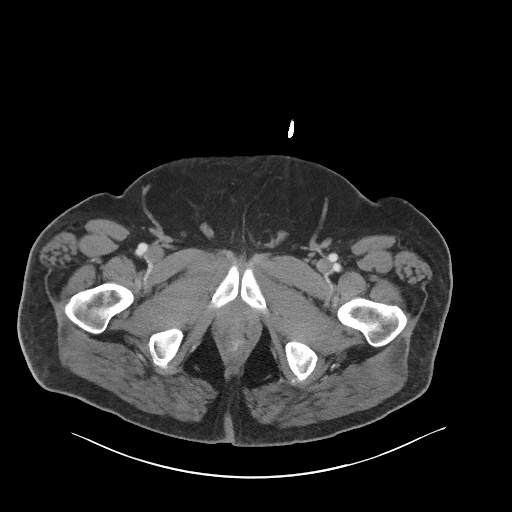
[im 21/91  soft-tissue]
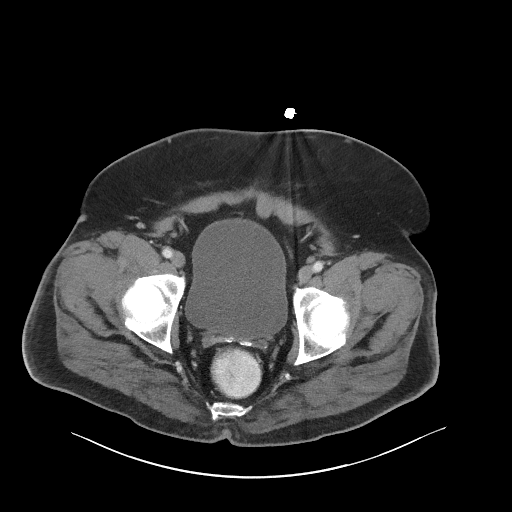
[im 26/91  soft-tissue]
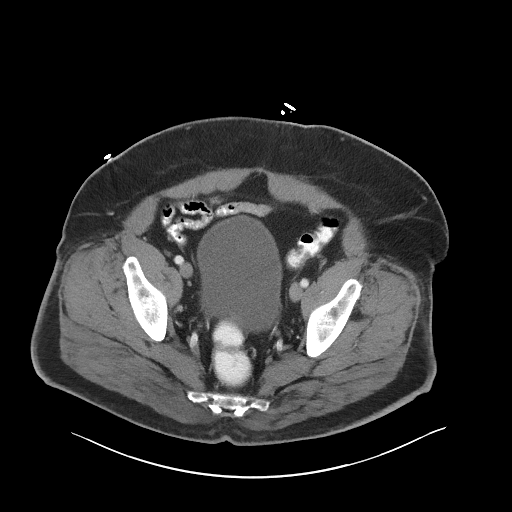
[im 31/91  soft-tissue]
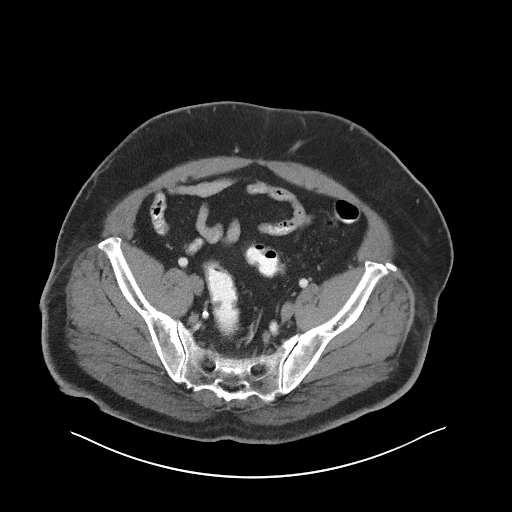
[im 41/91  soft-tissue]
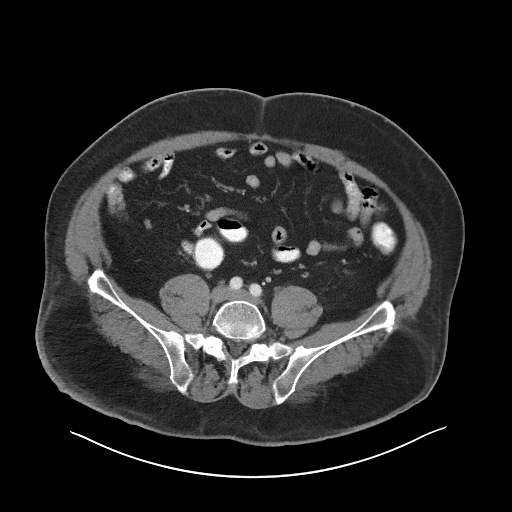
[im 46/91  soft-tissue]
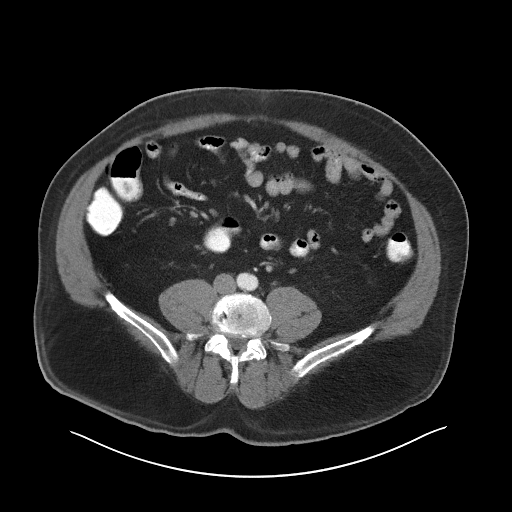
[im 51/91  soft-tissue]
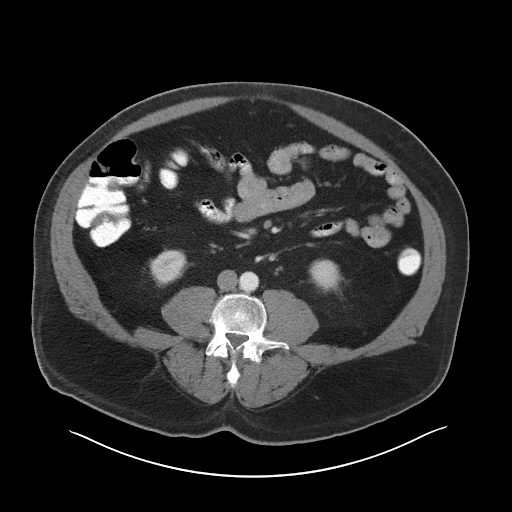
[im 61/91  soft-tissue]
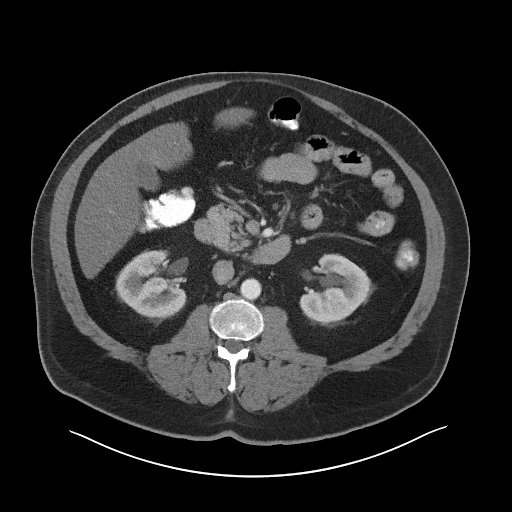
[im 61/91  bone]
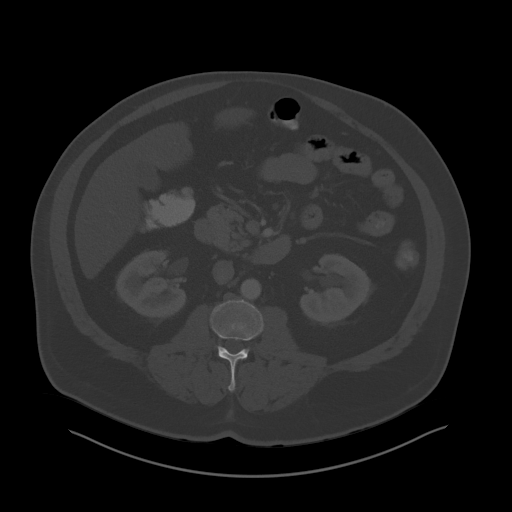
[im 66/91  soft-tissue]
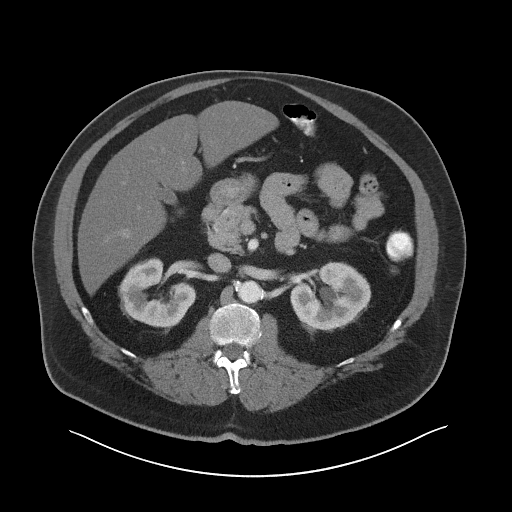
[im 71/91  soft-tissue]
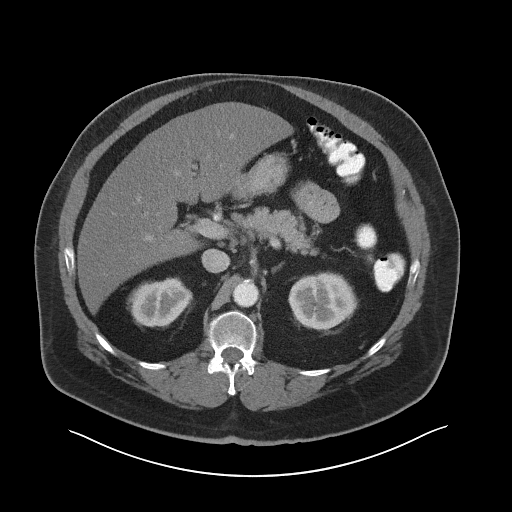
[im 71/91  lung]
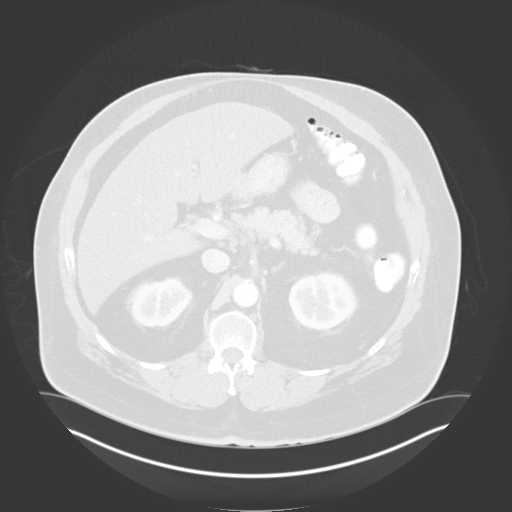
[im 76/91  lung]
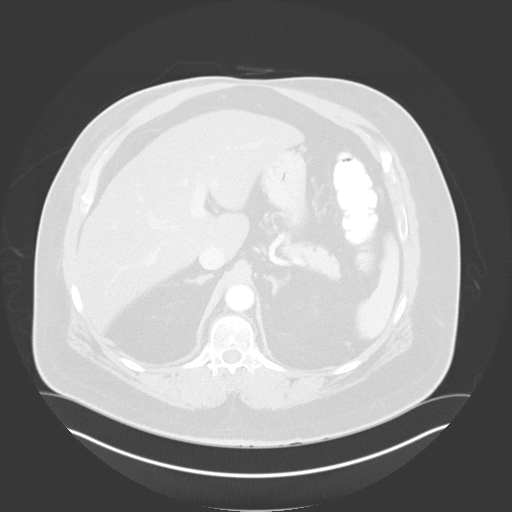
[im 81/91  soft-tissue]
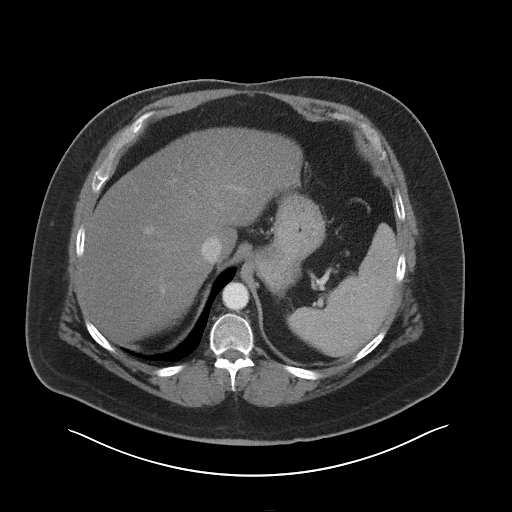
[im 81/91  lung]
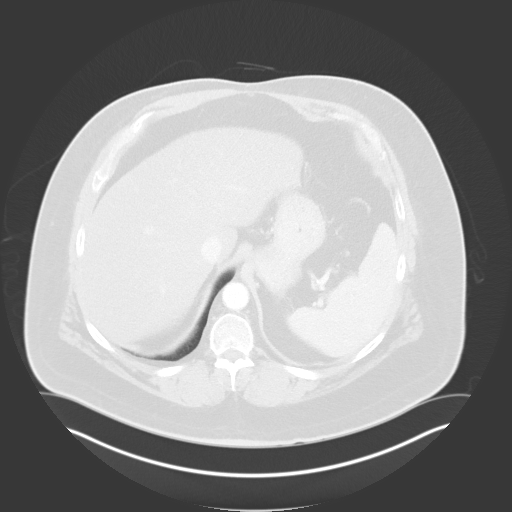
[im 86/91  soft-tissue]
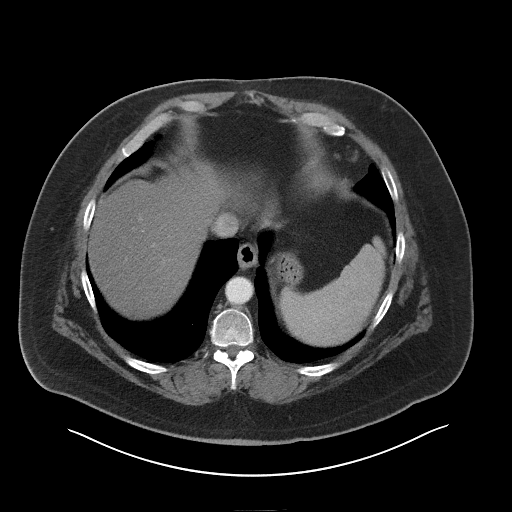
[im 86/91  lung]
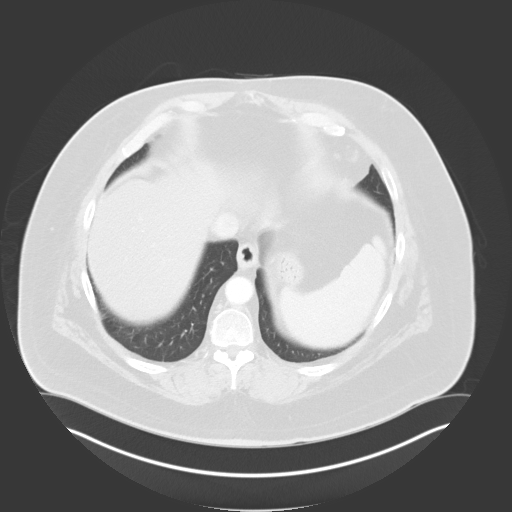

[14 of 32 positions shown; findings below may reference images not displayed]

PROCEDURE:     KCT - KCT ABDOMEN/PELVIS W  - September 10, 2010 [DATE]

RESULT:     CT of the abdomen and pelvis is performed utilizing 100 mL of
Isovue-FVQ iodinated intravenous contrast with images reconstructed at 5 mm
slice thickness in the axial plane and compared to previous noncontrast
study of 05/01/2008 and a contrast enhanced exam from 03/25/2007.

Images through the base of the lungs demonstrate minimal left lung base
atelectasis. The lungs are otherwise clear. There is low-attenuation
diffusely in the liver consistent with fatty infiltration. There is no focal
mass or ductal dilation. The distal esophagus appears unremarkable. The
abdominal aorta shows some atherosclerotic calcification without aneurysm.
The spleen enhances homogeneously and is normal in size. The adrenal glands
are unremarkable. The kidneys show no evidence of obstruction area no
abnormal bowel distention or bowel wall thickening is evident. The appendix
is seen and appears normal. There is no adenopathy. No renal mass or
pancreatic mass is evident. There is no ascites or pneumoperitoneum. There
is moderate distention of the urinary bladder area the prostate appears
unremarkable.
IMPRESSION: 1. Diffuse fatty infiltration of the liver.
2. Unremarkable examination otherwise.

## 2012-06-12 ENCOUNTER — Emergency Department: Payer: Self-pay | Admitting: Emergency Medicine

## 2012-08-09 IMAGING — MR MRI LUMBAR SPINE WITHOUT CONTRAST
4 of 6 series · 25 of 48 positions shown · non-contrast
Comparison: none

REASON FOR EXAM: back pain
COMMENTS:

PROCEDURE:     MMR - MMR LUMBAR SPINE WO CONTRAST  - December 13, 2010  [DATE]
RESULT:
HISTORY: Low-back pain.

[Series 2: T2 · sagittal · 4.0mm · 0.88mm/px · 5 of 14 slices shown (1 of 2)]
[im 1/14]
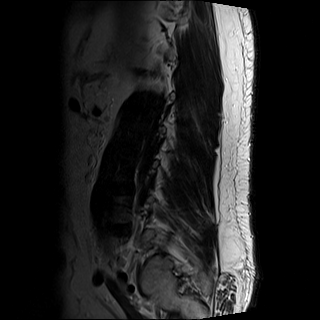
[im 4/14]
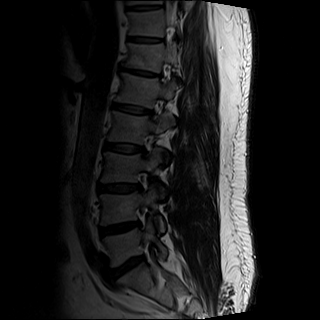
[im 7/14]
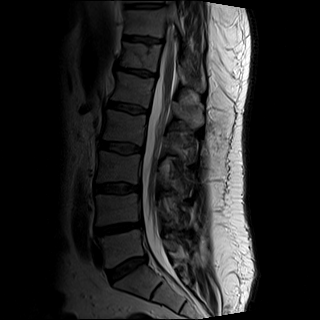
[im 10/14]
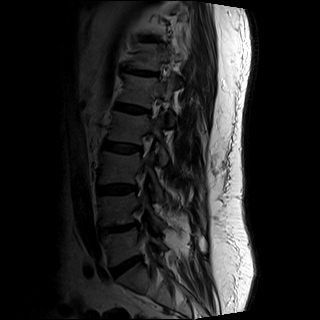
[im 14/14]
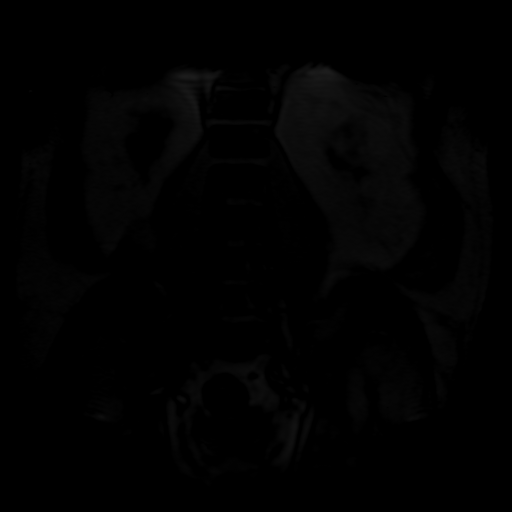

[Series 4: T1 · sagittal · 4.0mm · 0.44mm/px · 5 of 14 slices shown (1 of 2)]
[im 1/14]
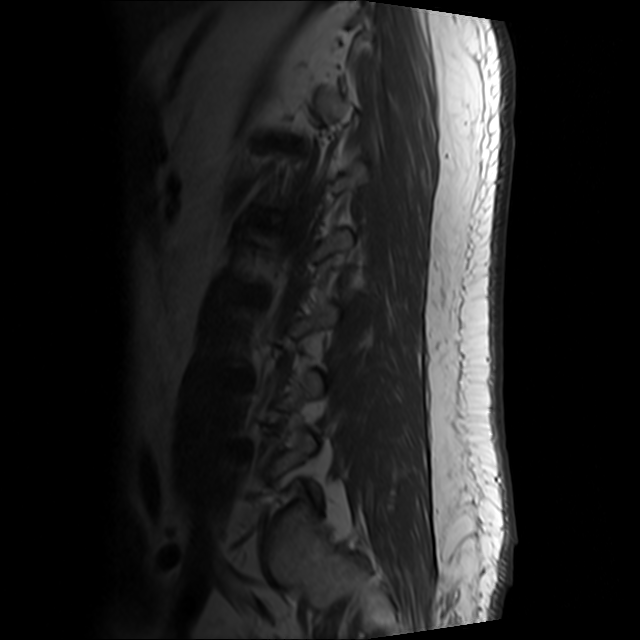
[im 4/14]
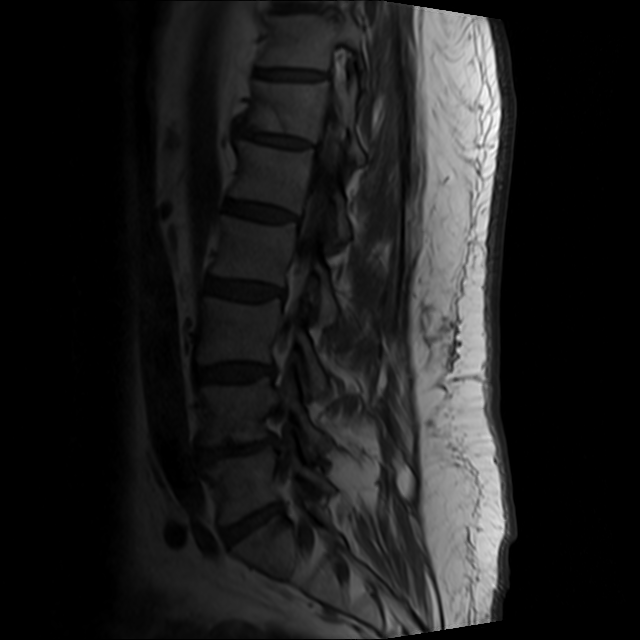
[im 7/14]
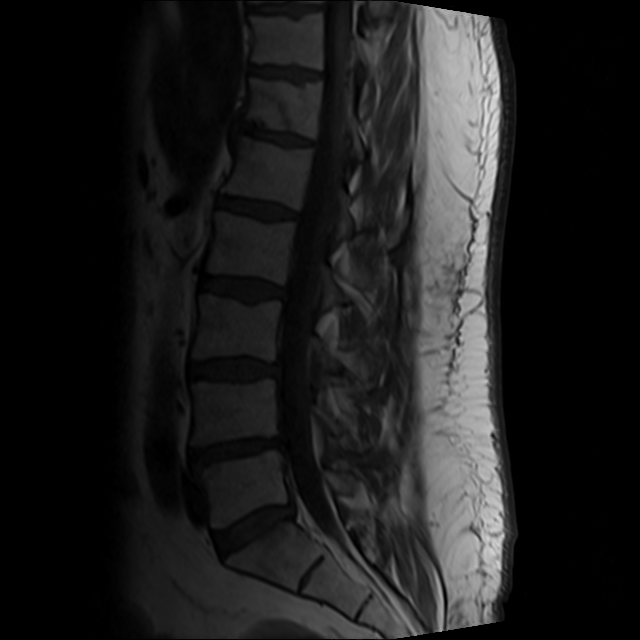
[im 10/14]
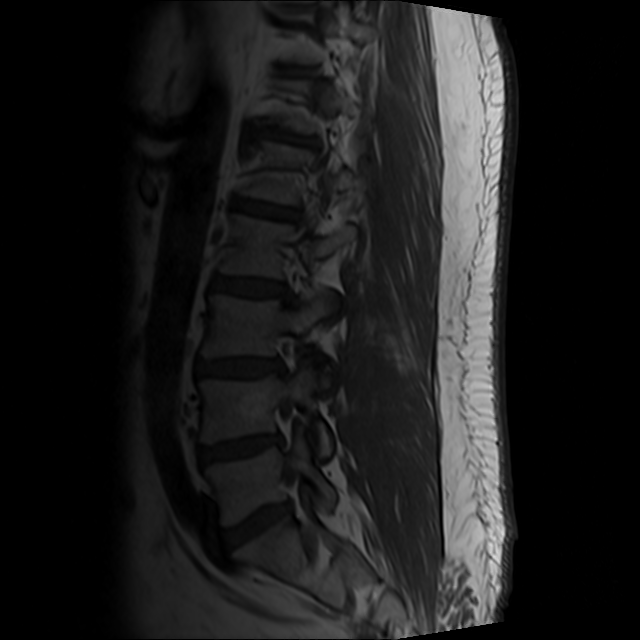
[im 14/14]
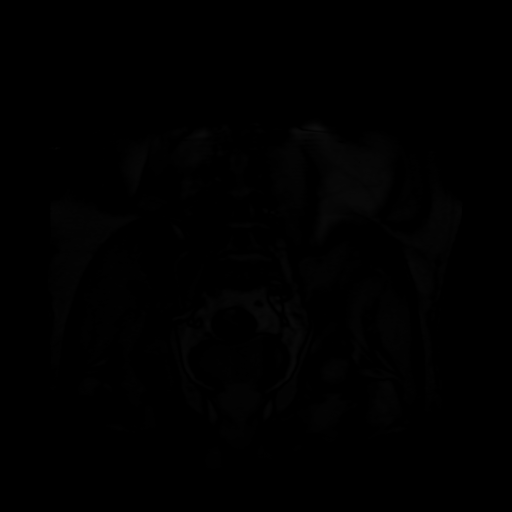

[Series 6: T1 · axial · 4.0mm · 0.39mm/px · z∈[-10,+186]mm · 7 of 40 slices shown (2 of 2)]
[im 1/40]
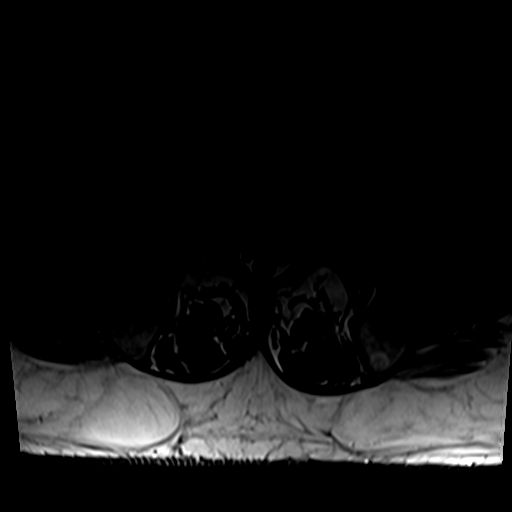
[im 7/40]
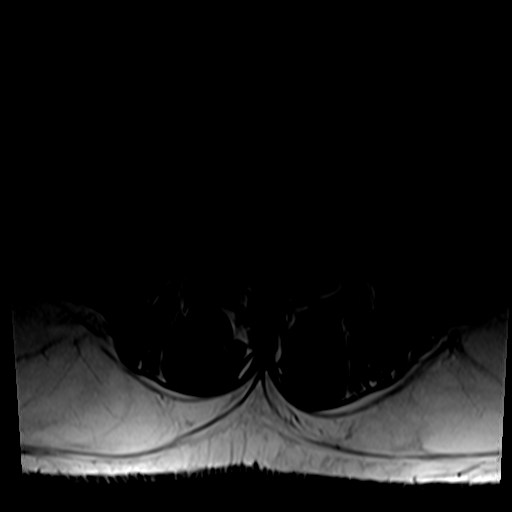
[im 13/40]
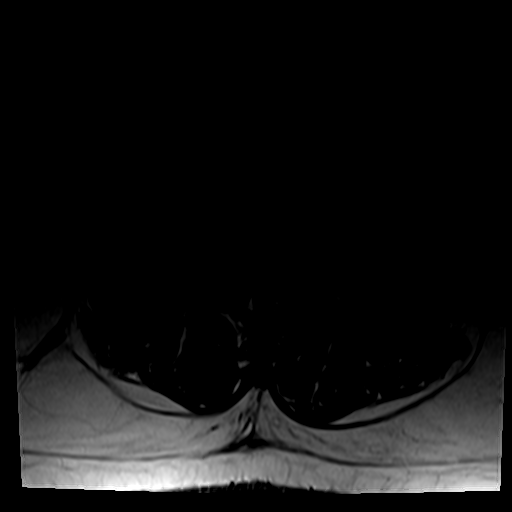
[im 19/40]
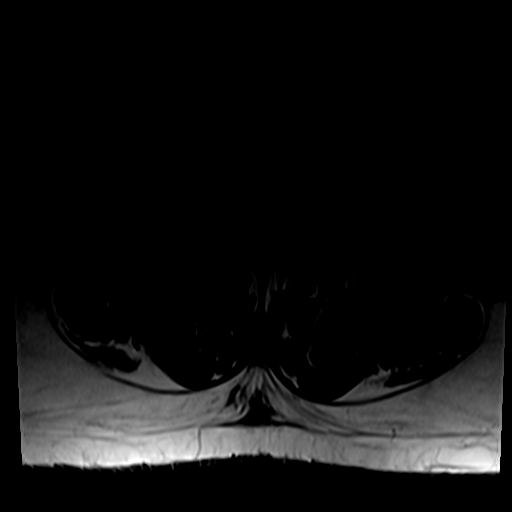
[im 22/40]
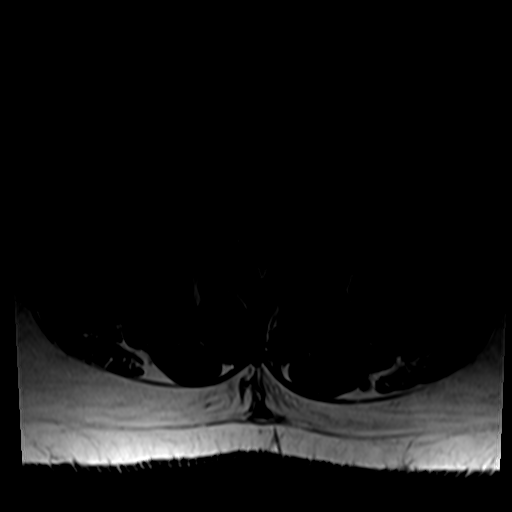
[im 28/40]
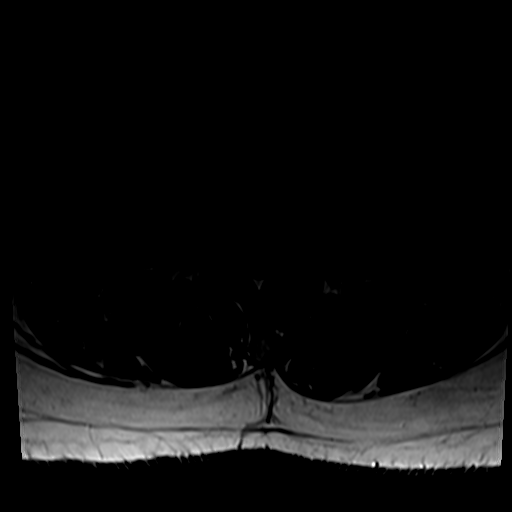
[im 34/40]
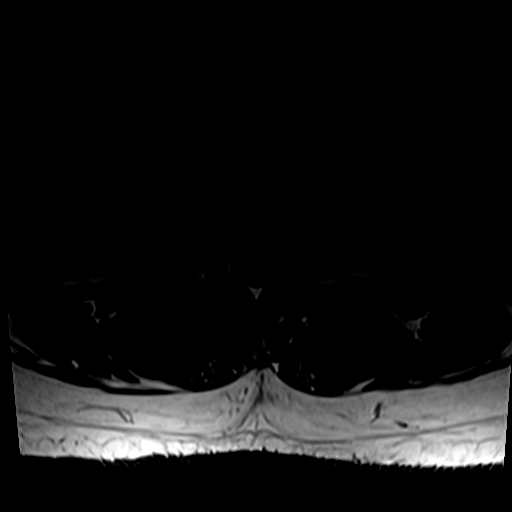

[Series 7: T2 · axial · 4.0mm · 0.78mm/px · z∈[-10,+221]mm · 8 of 40 slices shown (2 of 2)]
[im 1/40]
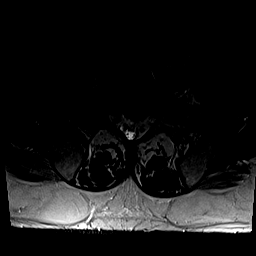
[im 7/40]
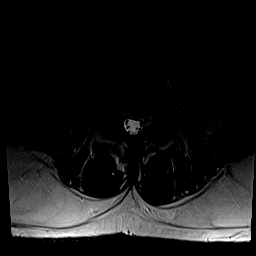
[im 13/40]
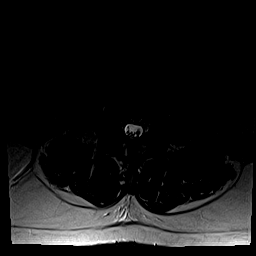
[im 19/40]
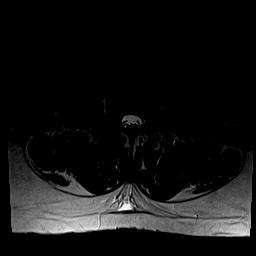
[im 22/40]
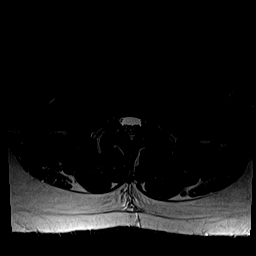
[im 28/40]
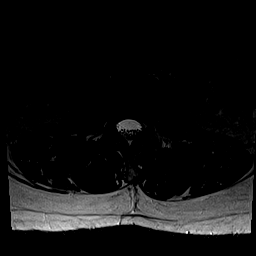
[im 34/40]
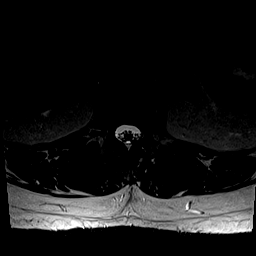
[im 40/40]
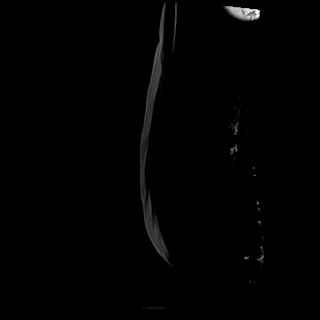

[25 of 48 positions shown; findings below may reference images not displayed]

COMPARISON STUDY:     Plain films of the lumbar spine of 08/01/2010.

No acute bony abnormality is identified. Lumbar cord is normal.

T12-L1: No significant disc protrusion or spinal stenosis.

L1-L2:  No significant disc protrusion or spinal stenosis.

L2-L3:  No significant disc protrusion or spinal stenosis.

L3-L4:  Annular bulge and facetal hypertrophy asymmetrically prominent on
the left with mild flattening of the left lateral recess and moderate
narrowing of the left neural foramen at this level.

L4-L5:  Mild annular bulge. No significant disc protrusion or spinal
stenosis. No neural foraminal narrowing. A tiny associated central disc
protrusion cannot be completely excluded but again no prominent disc
protrusion is present.

L5-S1: Annular bulge is present. This is asymmetric to left. This is
associated with facetal hypertrophy and results in mild narrowing of the
left neural foramen. No paraspinal lesion is noted.
IMPRESSION: 1.     Mild degenerative change.
2.     No high-grade spinal stenosis, neuroforaminal narrowing or prominent
disc protrusion.
3.     No acute bony abnormality.

## 2012-08-22 ENCOUNTER — Inpatient Hospital Stay: Payer: Self-pay | Admitting: Internal Medicine

## 2012-08-22 LAB — CK TOTAL AND CKMB (NOT AT ARMC)
CK, Total: 194 U/L (ref 35–232)
CK-MB: 3.1 ng/mL (ref 0.5–3.6)
CK-MB: 7.7 ng/mL — ABNORMAL HIGH (ref 0.5–3.6)

## 2012-08-22 LAB — COMPREHENSIVE METABOLIC PANEL
Albumin: 3.7 g/dL (ref 3.4–5.0)
Alkaline Phosphatase: 73 U/L (ref 50–136)
Bilirubin,Total: 0.4 mg/dL (ref 0.2–1.0)
Calcium, Total: 8.7 mg/dL (ref 8.5–10.1)
Chloride: 103 mmol/L (ref 98–107)
Creatinine: 0.83 mg/dL (ref 0.60–1.30)
Glucose: 269 mg/dL — ABNORMAL HIGH (ref 65–99)
Potassium: 4.1 mmol/L (ref 3.5–5.1)
SGOT(AST): 31 U/L (ref 15–37)
SGPT (ALT): 52 U/L (ref 12–78)
Sodium: 134 mmol/L — ABNORMAL LOW (ref 136–145)

## 2012-08-22 LAB — CBC WITH DIFFERENTIAL/PLATELET
Basophil %: 0.3 %
Lymphocyte #: 1.8 10*3/uL (ref 1.0–3.6)
Lymphocyte %: 22.6 %
MCH: 29.5 pg (ref 26.0–34.0)
MCHC: 34.2 g/dL (ref 32.0–36.0)
Monocyte #: 0.5 x10 3/mm (ref 0.2–1.0)
Platelet: 185 10*3/uL (ref 150–440)
WBC: 8 10*3/uL (ref 3.8–10.6)

## 2012-08-22 LAB — TSH: Thyroid Stimulating Horm: 1.03 u[IU]/mL

## 2012-08-22 LAB — TROPONIN I: Troponin-I: <0.02 ng/mL

## 2012-08-23 LAB — BASIC METABOLIC PANEL
Calcium, Total: 8.2 mg/dL — ABNORMAL LOW (ref 8.5–10.1)
Chloride: 106 mmol/L (ref 98–107)
Co2: 26 mmol/L (ref 21–32)
EGFR (Non-African Amer.): >60
Glucose: 211 mg/dL — ABNORMAL HIGH (ref 65–99)
Potassium: 4.1 mmol/L (ref 3.5–5.1)

## 2012-08-23 LAB — CK TOTAL AND CKMB (NOT AT ARMC): CK, Total: 160 U/L (ref 35–232)

## 2012-08-23 LAB — CBC WITH DIFFERENTIAL/PLATELET
Basophil #: 0.1 10*3/uL (ref 0.0–0.1)
Basophil %: 1 %
HCT: 42 % (ref 40.0–52.0)
Lymphocyte %: 32.6 %
MCH: 28.9 pg (ref 26.0–34.0)
MCHC: 33.5 g/dL (ref 32.0–36.0)
MCV: 86 fL (ref 80–100)
Monocyte #: 0.5 x10 3/mm (ref 0.2–1.0)
Neutrophil %: 52.1 %
RDW: 13.5 % (ref 11.5–14.5)
WBC: 6.1 10*3/uL (ref 3.8–10.6)

## 2012-08-23 LAB — LIPID PANEL
HDL Cholesterol: 27 mg/dL — ABNORMAL LOW (ref 40–60)
Triglycerides: 292 mg/dL — ABNORMAL HIGH (ref 0–200)
VLDL Cholesterol, Calc: 58 mg/dL — ABNORMAL HIGH (ref 5–40)

## 2012-08-23 LAB — HEMOGLOBIN A1C: Hemoglobin A1C: 10.1 % — ABNORMAL HIGH (ref 4.2–6.3)

## 2012-08-24 LAB — BASIC METABOLIC PANEL
Anion Gap: 8 (ref 7–16)
Calcium, Total: 8.5 mg/dL (ref 8.5–10.1)
Chloride: 106 mmol/L (ref 98–107)
Creatinine: 0.73 mg/dL (ref 0.60–1.30)
EGFR (African American): >60
EGFR (Non-African Amer.): >60
Glucose: 139 mg/dL — ABNORMAL HIGH (ref 65–99)
Osmolality: 277 (ref 275–301)
Potassium: 3.6 mmol/L (ref 3.5–5.1)
Sodium: 138 mmol/L (ref 136–145)

## 2012-08-25 LAB — BASIC METABOLIC PANEL
Anion Gap: 3 — ABNORMAL LOW (ref 7–16)
BUN: 11 mg/dL (ref 7–18)
Chloride: 106 mmol/L (ref 98–107)
Co2: 30 mmol/L (ref 21–32)
Creatinine: 0.84 mg/dL (ref 0.60–1.30)
EGFR (African American): >60
Sodium: 139 mmol/L (ref 136–145)

## 2012-08-25 LAB — CBC WITH DIFFERENTIAL/PLATELET
HCT: 40.9 % (ref 40.0–52.0)
HGB: 13.9 g/dL (ref 13.0–18.0)
Lymphocyte %: 23.5 %
MCH: 29.2 pg (ref 26.0–34.0)
Monocyte #: 0.5 x10 3/mm (ref 0.2–1.0)
Neutrophil #: 4.2 10*3/uL (ref 1.4–6.5)
RDW: 13.8 % (ref 11.5–14.5)

## 2012-09-14 ENCOUNTER — Ambulatory Visit: Payer: Self-pay | Admitting: Unknown Physician Specialty

## 2012-11-01 IMAGING — CT CT ABDOMEN W/ CM
1 of 2 series · 14 of 32 positions shown, 19 images · non-contrast
Comparison: none

REASON FOR EXAM: LUQ Abd Pain Radiating to Back
COMMENTS:

PROCEDURE:     KCT - KCT ABDOMEN STANDARD W  - March 07, 2011  [DATE]
RESULT:     Comparison is made to a previous study dated 01/24/2010.
TECHNIQUE: Helical 5 mm sections were obtained from the lung bases through
the superior iliac crest status post intravenous administration of 100 ml of
Zsovue-6ZG.

[Series 2: abd with 5.0 i40f 3 · axial · 0.98mm/px · z∈[+398,+668]mm · 14 of 60 slices shown, 19 images]
[im 3/60  soft-tissue]
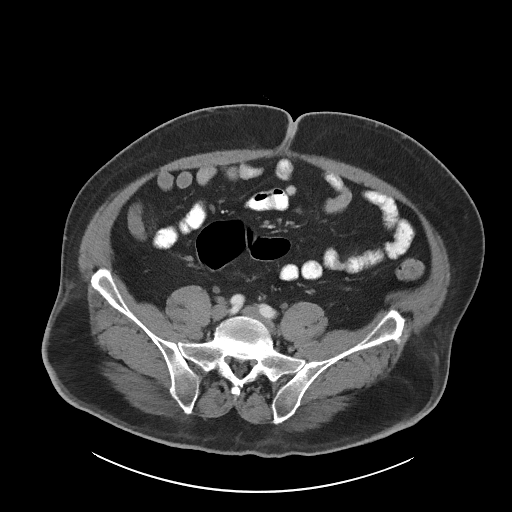
[im 3/60  bone]
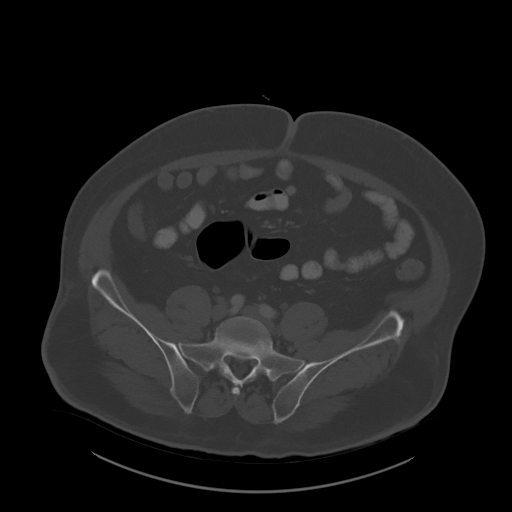
[im 9/60  soft-tissue]
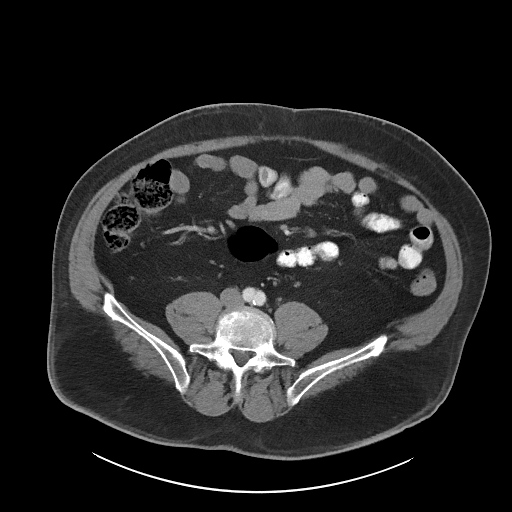
[im 12/60  soft-tissue]
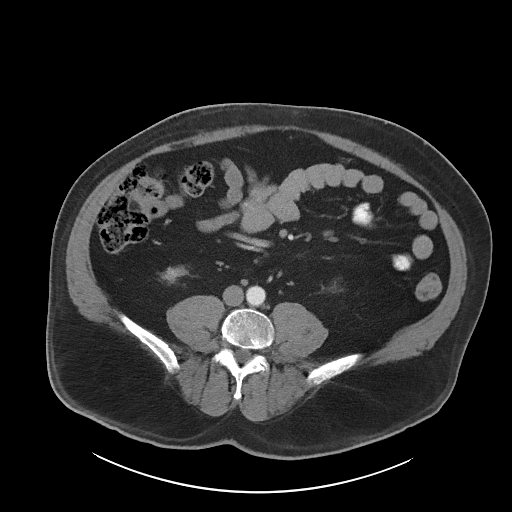
[im 17/60  soft-tissue]
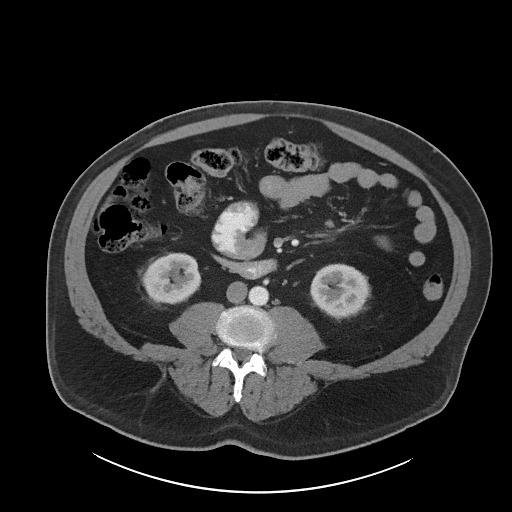
[im 20/60  soft-tissue]
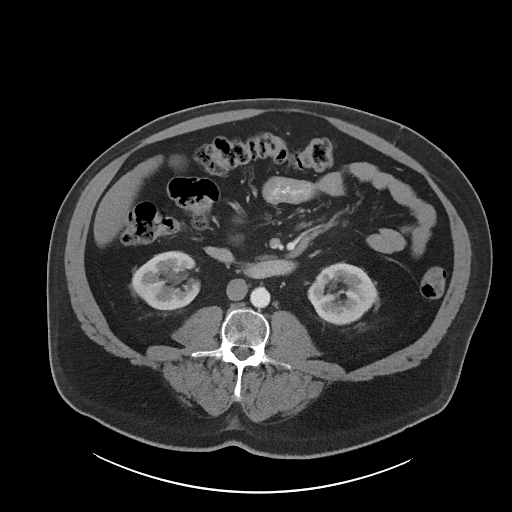
[im 26/60  soft-tissue]
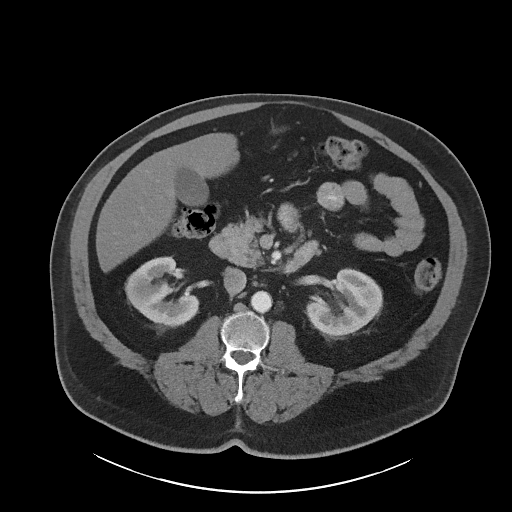
[im 31/60  soft-tissue]
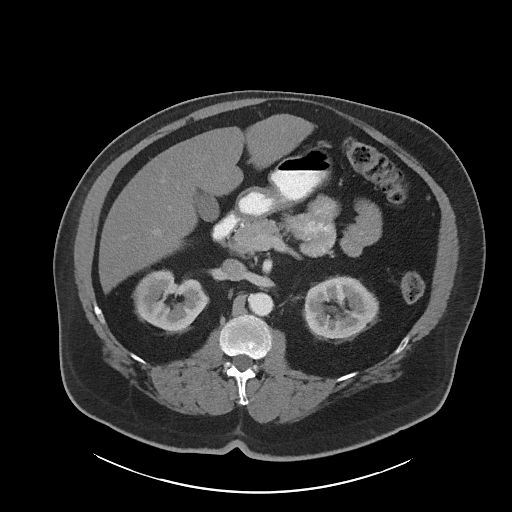
[im 34/60  soft-tissue]
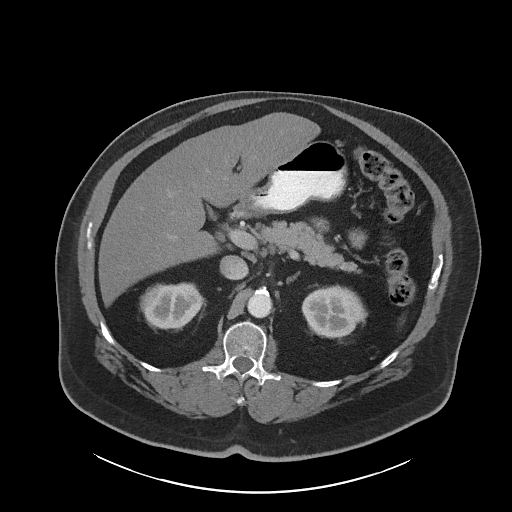
[im 40/60  soft-tissue]
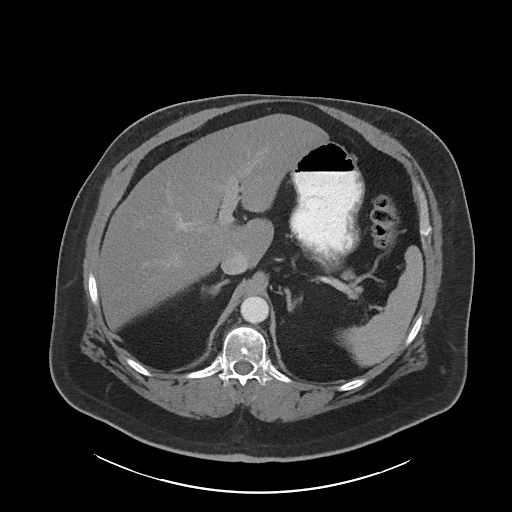
[im 40/60  bone]
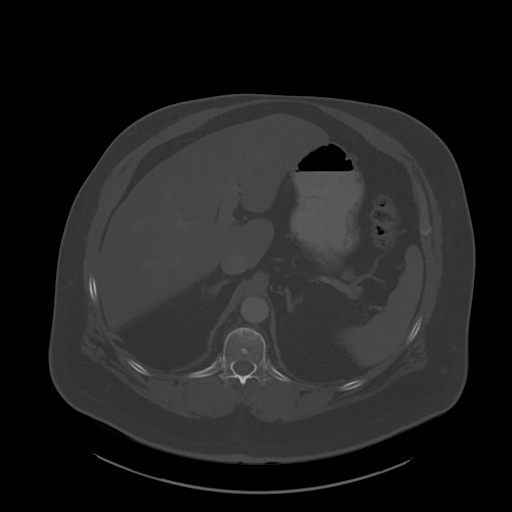
[im 43/60  soft-tissue]
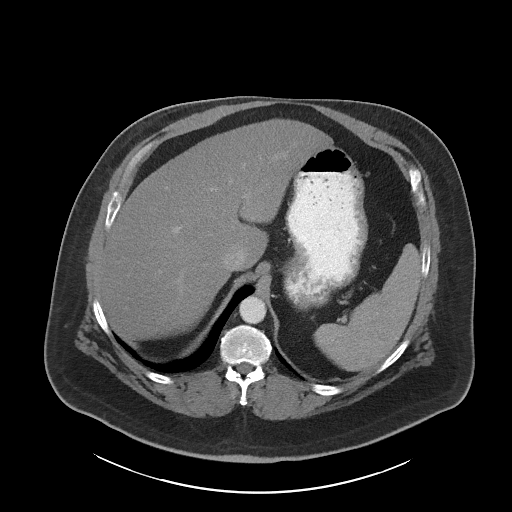
[im 48/60  soft-tissue]
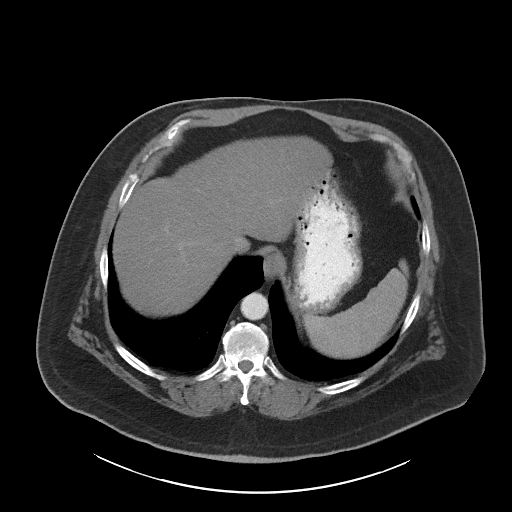
[im 48/60  lung]
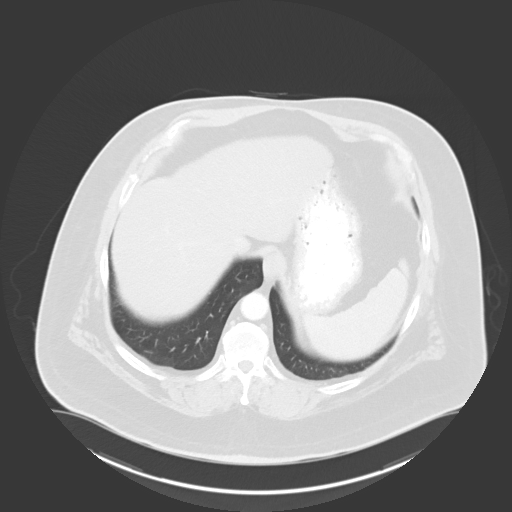
[im 51/60  soft-tissue]
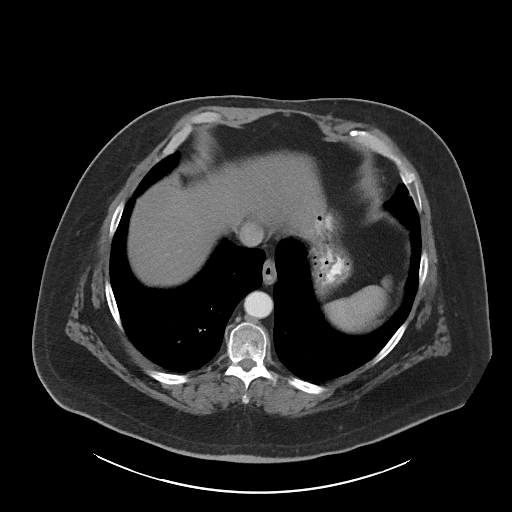
[im 51/60  lung]
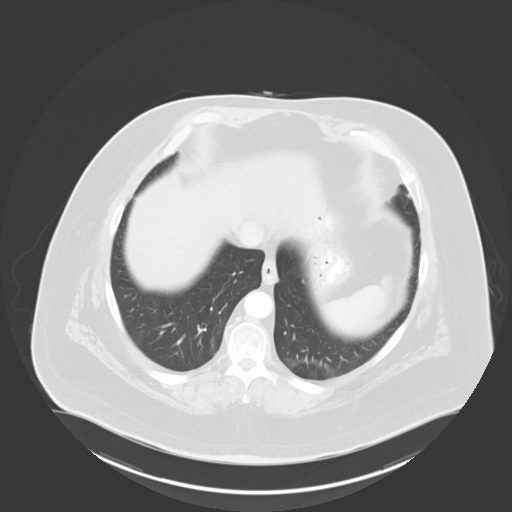
[im 54/60  lung]
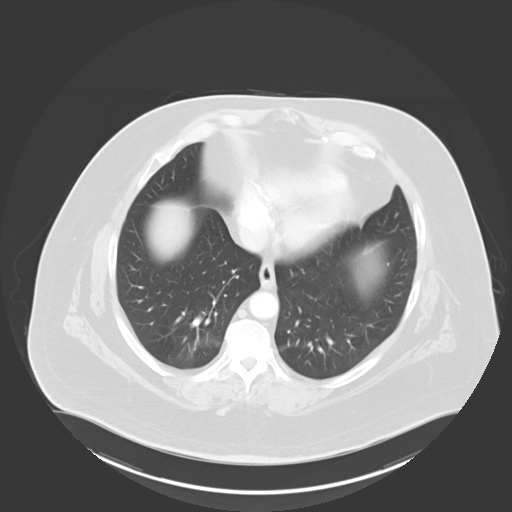
[im 57/60  soft-tissue]
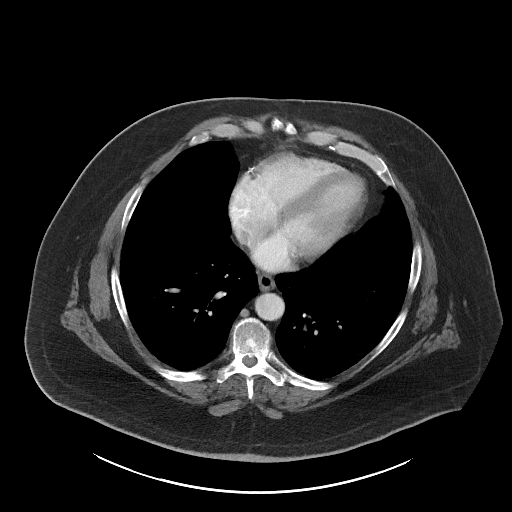
[im 57/60  lung]
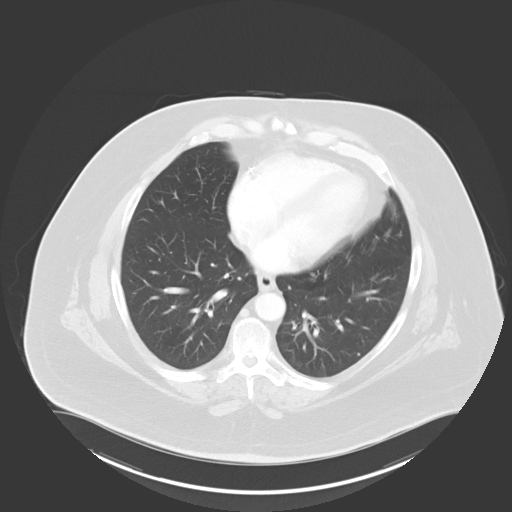

[14 of 32 positions shown; findings below may reference images not displayed]

FINDINGS: Mild hypoventilation is appreciated within the lung bases which
are otherwise unremarkable.

The liver, spleen, adrenals, pancreas, and kidneys are unremarkable.

There is no CT evidence of enteritis, colitis, or diverticulitis within the
visualized portions of the large and small bowel. There is no evidence of
abdominal free fluid, loculated fluid collections, masses or adenopathy. The
appendix is identified and is unremarkable. There is no evidence of an
abdominal aortic aneurysm. The celiac, SMA, IMA, portal vein and SMV are
opacified.
IMPRESSION: 1.  No CT evidence of obstructive or inflammatory abnormalities.
2.  Note, there are findings once again consistent with hepatic steatosis.

## 2013-01-12 IMAGING — CR DG CHEST 1V PORT
1 series · 1 of 1 positions shown · non-contrast
Comparison: none

REASON FOR EXAM: Chest Pain
COMMENTS:

PROCEDURE:     DXR - DXR PORTABLE CHEST SINGLE VIEW  - May 18, 2011 [DATE]
RESULT:     Comparison: None.

[portable]
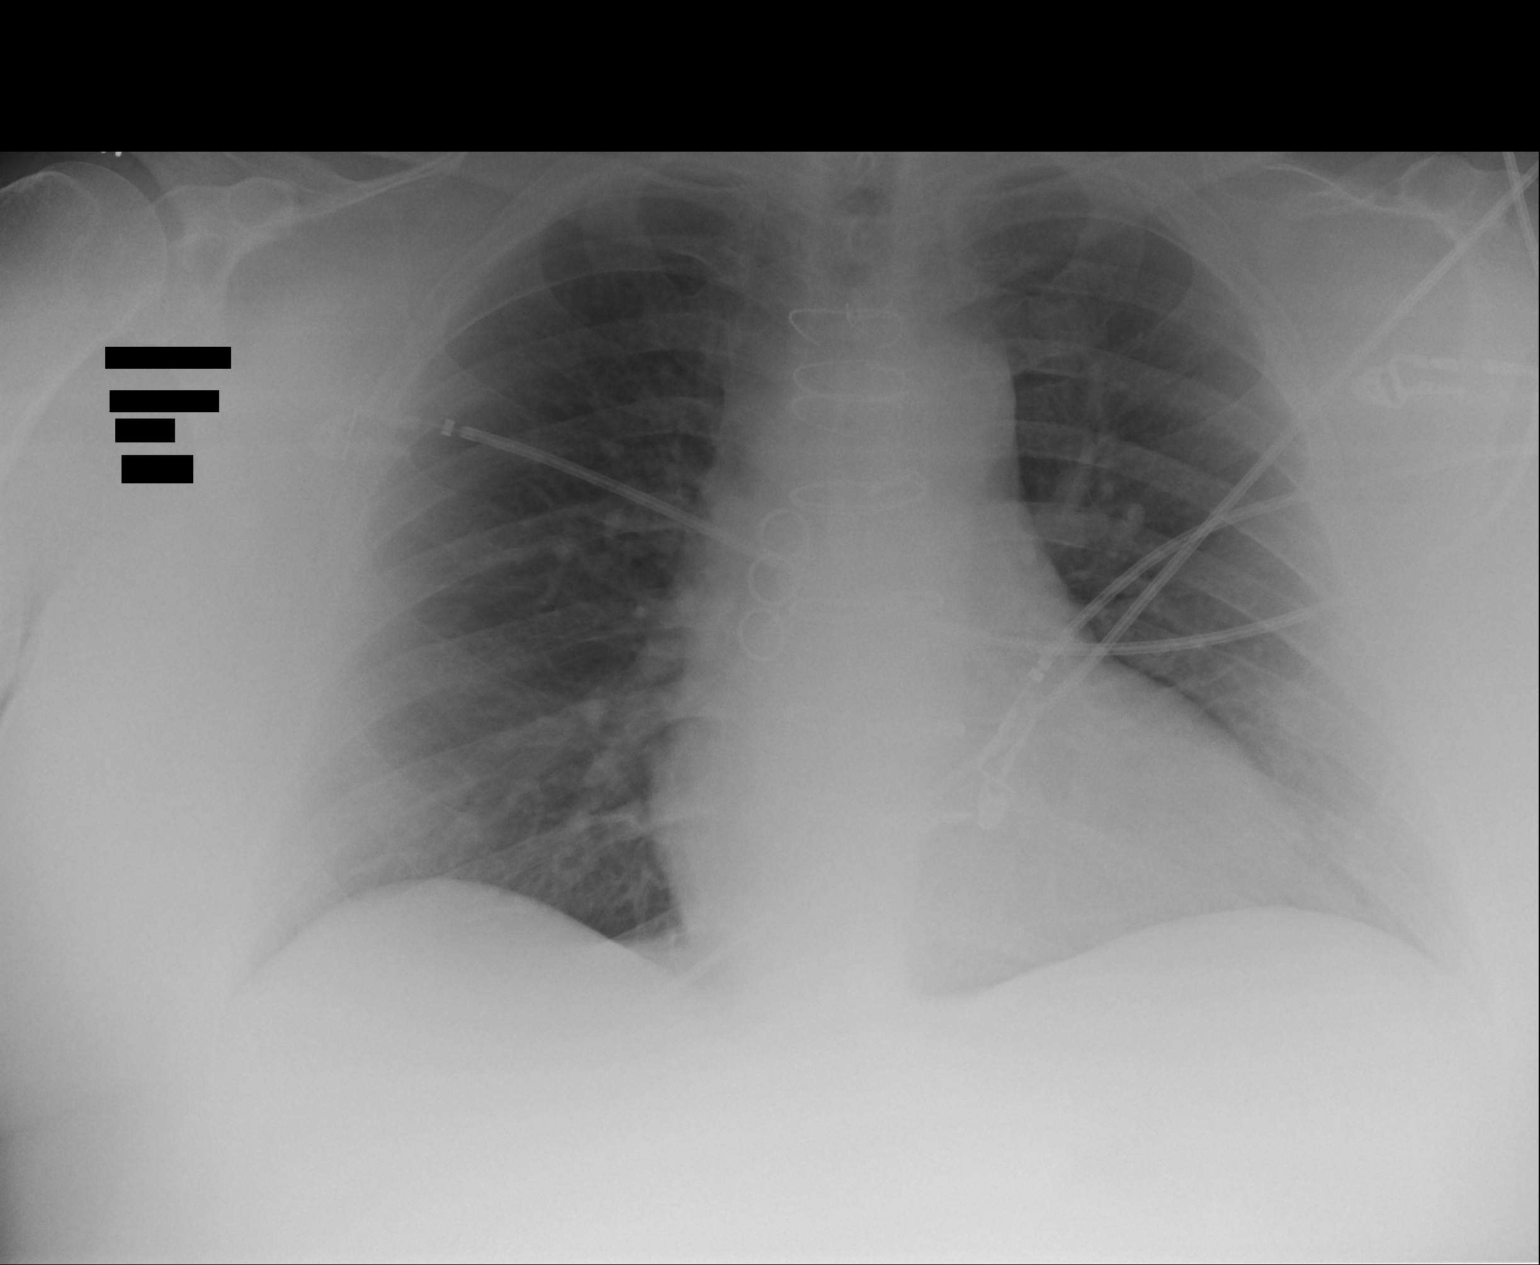

[1 of 1 positions shown; findings below may reference images not displayed]

FINDINGS: Heart size upper limits of normal. Prior median sternotomy and CABG. No
focal pulmonary opacities.
IMPRESSION: No acute cardiopulmonary disease.

## 2013-03-01 ENCOUNTER — Encounter: Payer: Self-pay | Admitting: Cardiothoracic Surgery

## 2013-03-01 ENCOUNTER — Emergency Department: Payer: Self-pay | Admitting: Emergency Medicine

## 2013-03-01 LAB — BASIC METABOLIC PANEL
Anion Gap: 6 — ABNORMAL LOW (ref 7–16)
BUN: 20 mg/dL — ABNORMAL HIGH (ref 7–18)
Calcium, Total: 8.7 mg/dL (ref 8.5–10.1)
Co2: 24 mmol/L (ref 21–32)
Creatinine: 0.97 mg/dL (ref 0.60–1.30)
EGFR (Non-African Amer.): >60
Potassium: 4.1 mmol/L (ref 3.5–5.1)
Sodium: 137 mmol/L (ref 136–145)

## 2013-03-01 LAB — CBC
HGB: 14.9 g/dL (ref 13.0–18.0)
MCH: 30.2 pg (ref 26.0–34.0)
Platelet: 166 10*3/uL (ref 150–440)
RBC: 4.95 10*6/uL (ref 4.40–5.90)
RDW: 13.6 % (ref 11.5–14.5)
WBC: 8.1 10*3/uL (ref 3.8–10.6)

## 2013-03-01 LAB — TROPONIN I: Troponin-I: <0.02 ng/mL

## 2013-03-16 ENCOUNTER — Encounter: Payer: Self-pay | Admitting: Cardiothoracic Surgery

## 2013-04-05 ENCOUNTER — Encounter: Payer: Self-pay | Admitting: Surgery

## 2013-04-16 ENCOUNTER — Encounter: Payer: Self-pay | Admitting: Surgery

## 2013-04-16 ENCOUNTER — Encounter: Payer: Self-pay | Admitting: Cardiothoracic Surgery

## 2013-06-07 ENCOUNTER — Other Ambulatory Visit: Payer: Self-pay | Admitting: Unknown Physician Specialty

## 2013-06-16 IMAGING — MR MRI LUMBAR SPINE WITHOUT CONTRAST
4 of 6 series · 23 of 48 positions shown · non-contrast
Comparison: none

REASON FOR EXAM: radiculopathy both arms and legs
COMMENTS:

PROCEDURE:     MMR - MMR LUMBAR SPINE WO CONTRAST  - October 20, 2011  [DATE]
RESULT:
HISTORY: Low back pain, left lower extremity symptoms.
Comparison Study: MRI lumbar spine of 12/13/2010.

[Series 2: T2 · sagittal · 4.0mm · 0.88mm/px · 5 of 17 slices shown (1 of 2)]
[im 1/17]
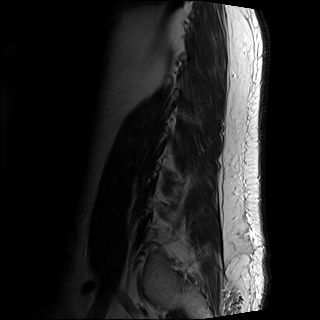
[im 5/17]
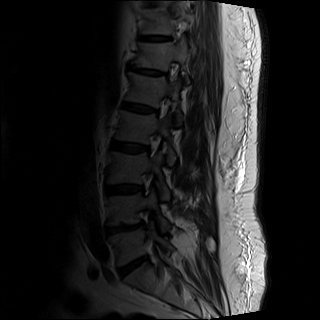
[im 9/17]
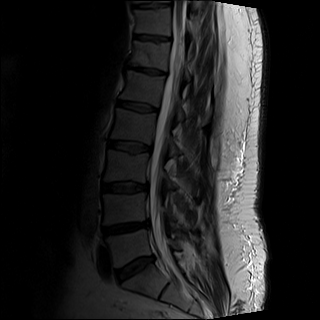
[im 13/17]
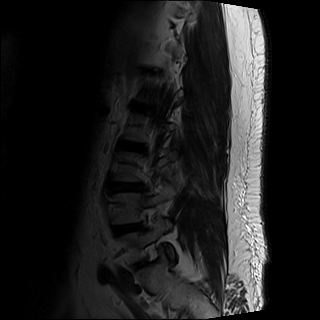
[im 17/17]
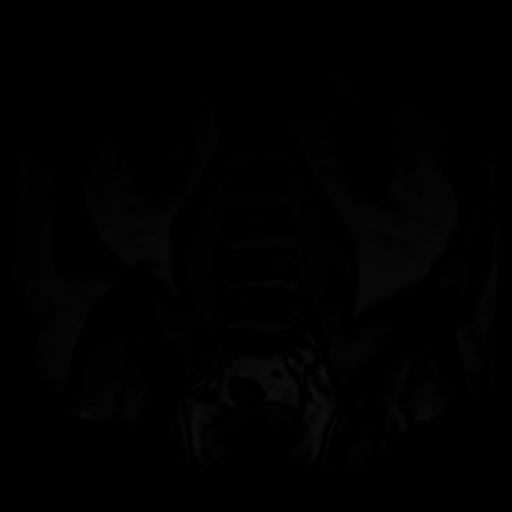

[Series 3: T1 · sagittal · 4.0mm · 0.44mm/px · 5 of 17 slices shown (1 of 2)]
[im 1/17]
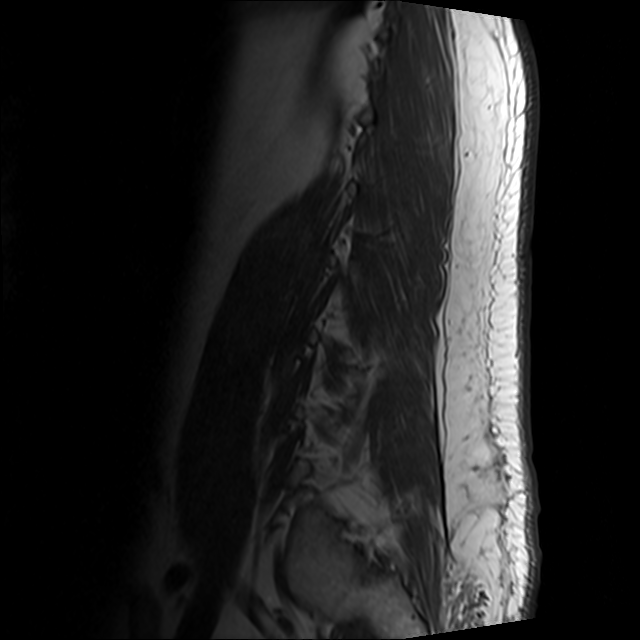
[im 5/17]
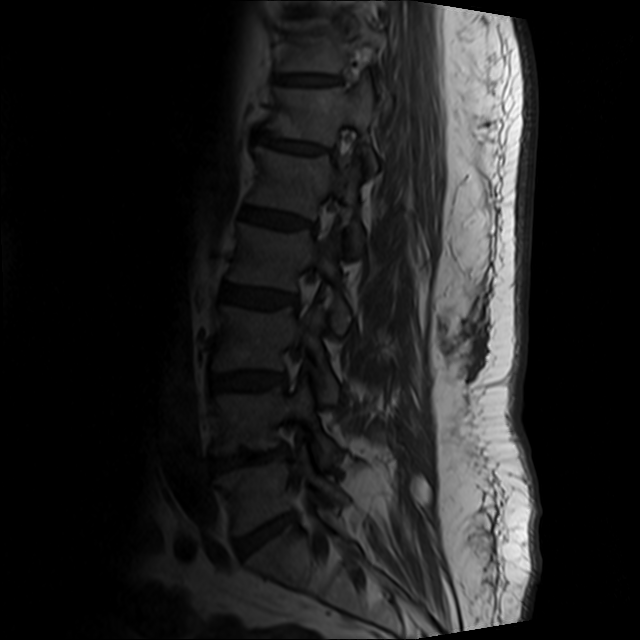
[im 9/17]
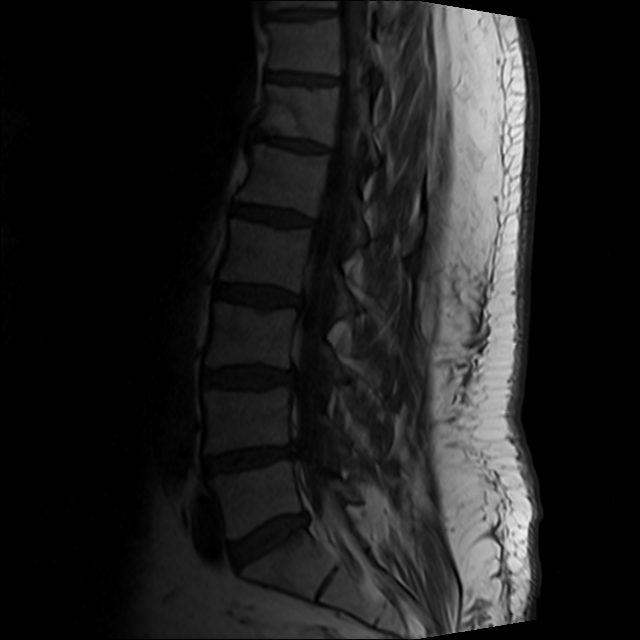
[im 13/17]
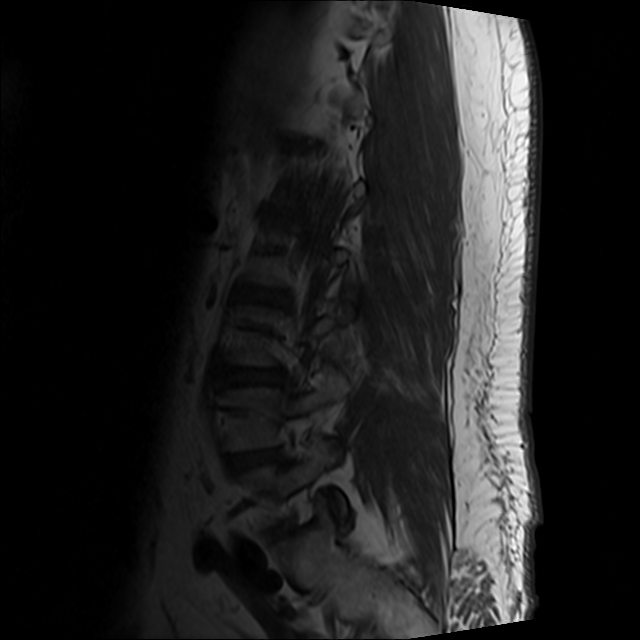
[im 17/17]
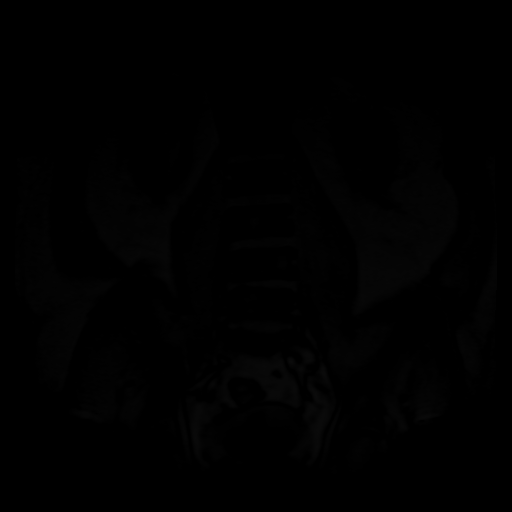

[Series 5: T2 · axial · 4.0mm · 0.78mm/px · z∈[-92,+172]mm · 8 of 46 slices shown (2 of 2)]
[im 1/46]
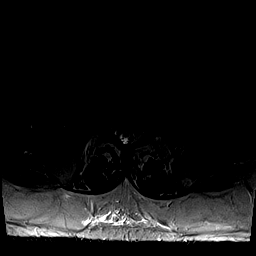
[im 7/46]
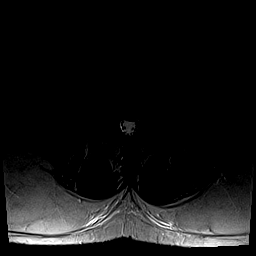
[im 14/46]
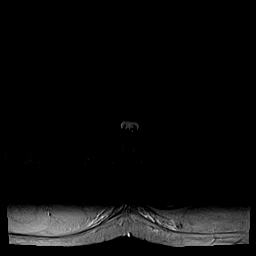
[im 21/46]
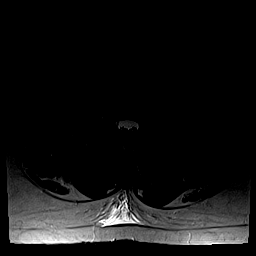
[im 25/46]
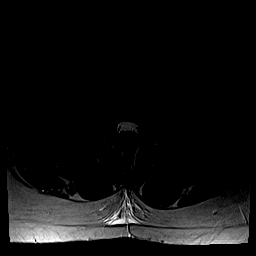
[im 32/46]
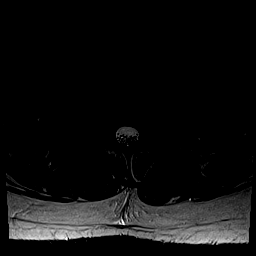
[im 39/46]
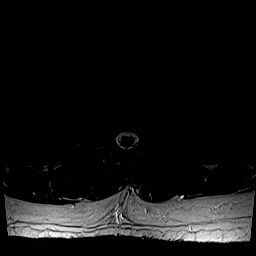
[im 46/46]
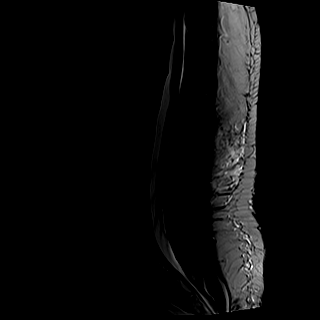

[Series 6: T1 · axial · 4.0mm · 0.31mm/px · z∈[-92,+122]mm · 5 of 46 slices shown (2 of 2)]
[im 1/46]
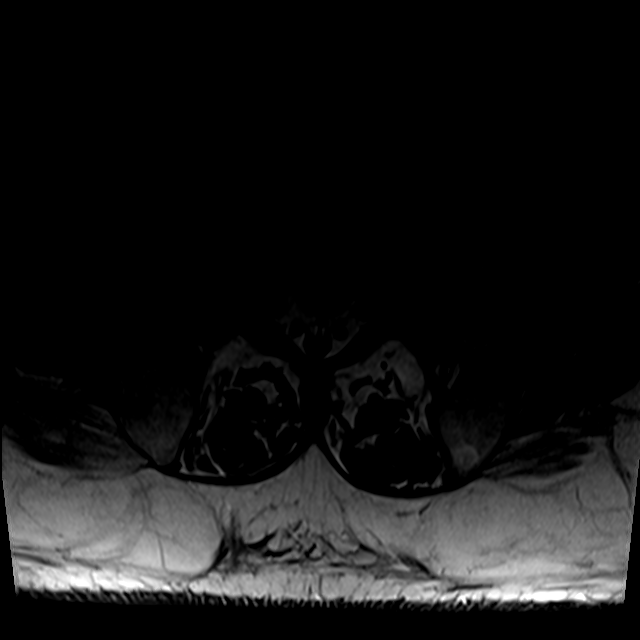
[im 7/46]
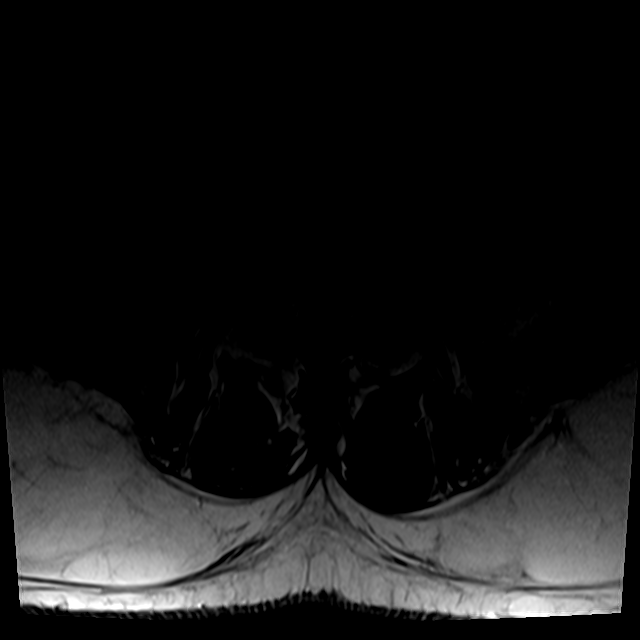
[im 14/46]
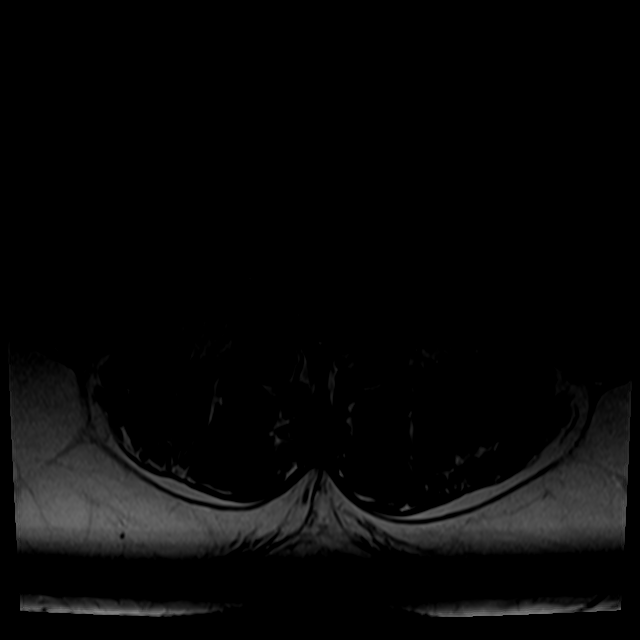
[im 25/46]
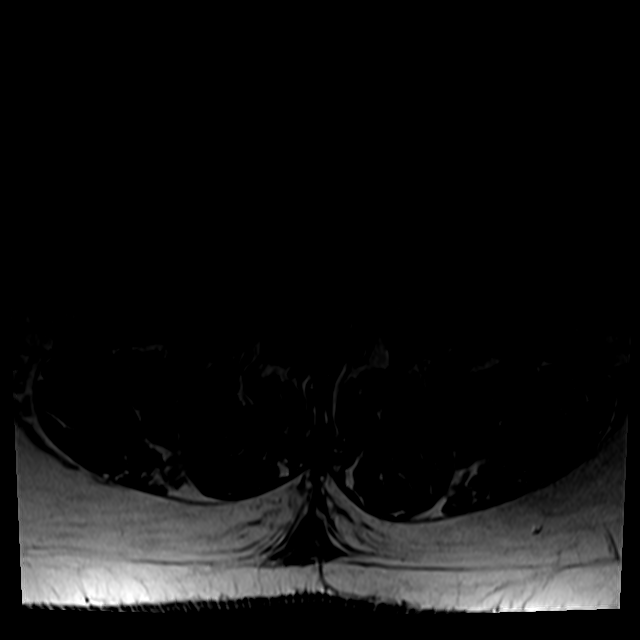
[im 39/46]
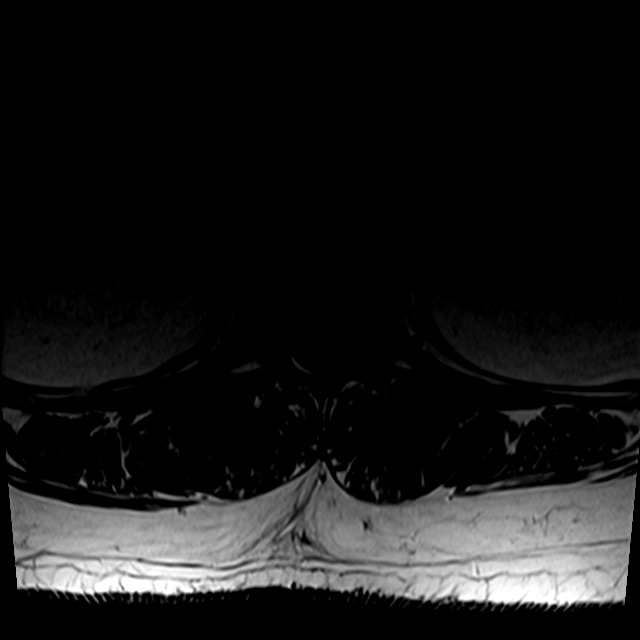

[23 of 48 positions shown; findings below may reference images not displayed]

FINDINGS: The lumbar cord is normal. No acute bony abnormality identified.
No paraspinal lesions noted. No evidence of disc protrusion or spinal
stenosis.

L3-4 annular bulge and facetal hypertrophy is present with moderate
narrowing of the left neural foramen.

L4-5 mild annular bulge with mild facetal hypertrophy is present with very
mild bilateral neural foraminal narrowing.

L5-S1, mild annular bulge is noted. Neural foramina are patent.
IMPRESSION: L3-4 asymmetric annular bulge and facet hypertrophy
resulting in moderate narrowing of the left neural foramen at this level. No
high-grade spinal stenosis or prominent disc protrusion.

## 2013-06-16 IMAGING — MR MRI THORACIC SPINE WITHOUT CONTRAST
7 of 8 series · 38 of 48 positions shown · non-contrast
Comparison: none

REASON FOR EXAM: radiculopathy both arms and legs
COMMENTS:

PROCEDURE:     MMR - MMR THORACIC SPINE WO  - October 20, 2011  [DATE]
RESULT:
HISTORY: Radiculopathy.

[Series 5: T2 · sagittal · 3.0mm · 1.03mm/px · 3 of 16 slices shown (1 of 4)]
[im 1/16]
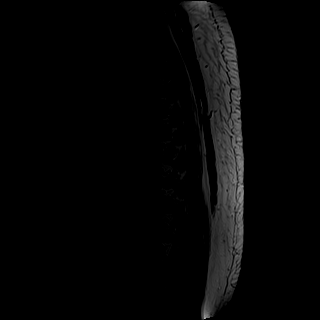
[im 8/16]
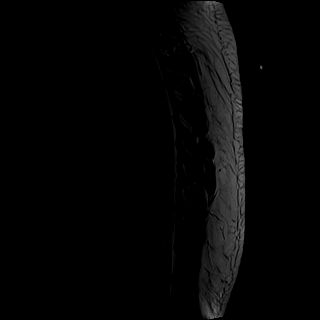
[im 16/16]
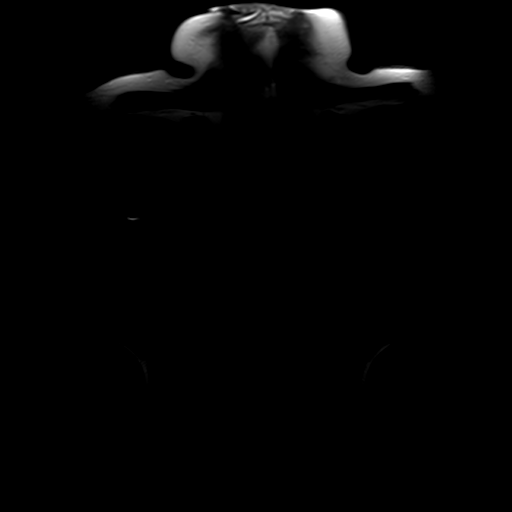

[Series 6: T1 · sagittal · 3.0mm · 1.03mm/px · 4 of 15 slices shown (1 of 3)]
[im 1/15]
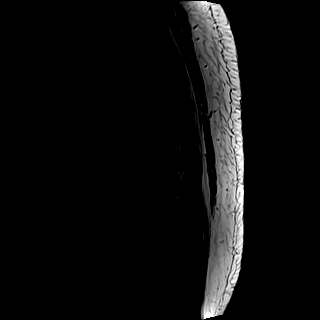
[im 5/15]
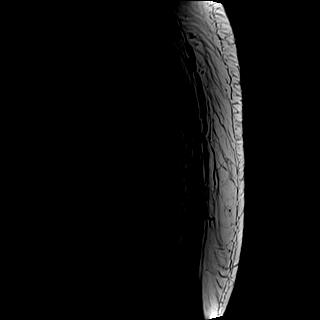
[im 10/15]
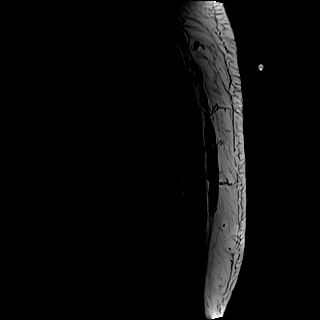
[im 15/15]
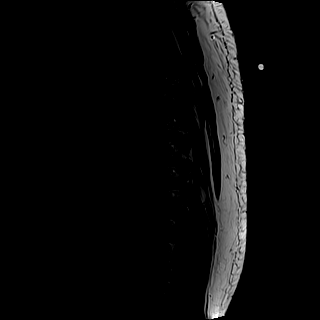

[Series 8: T2 · axial · 5.0mm · 0.86mm/px · z∈[-98,+96]mm · 7 of 27 slices shown (2 of 4)]
[im 1/27]
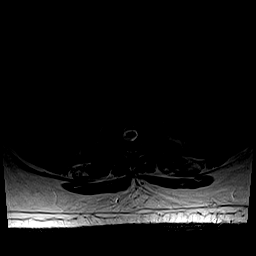
[im 5/27]
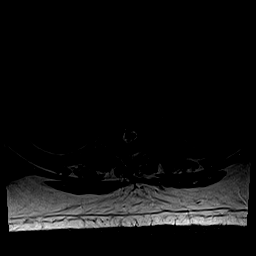
[im 9/27]
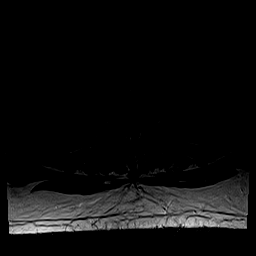
[im 14/27]
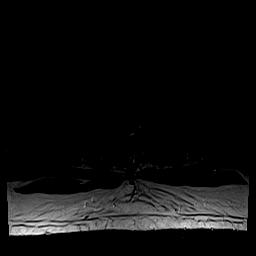
[im 18/27]
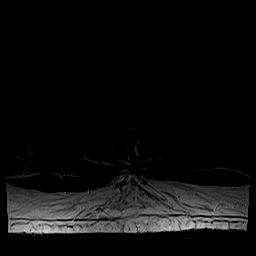
[im 22/27]
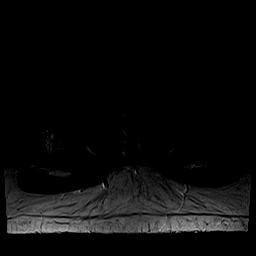
[im 27/27]
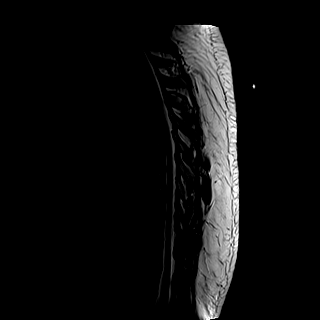

[Series 9: T2 · axial · 5.0mm · 0.86mm/px · z∈[-232,+68]mm · 8 of 29 slices shown (3 of 4)]
[im 1/29]
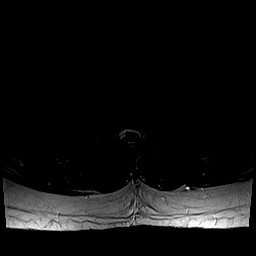
[im 5/29]
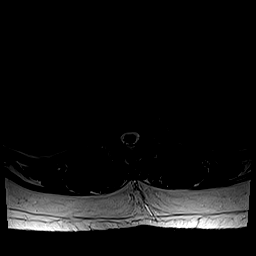
[im 9/29]
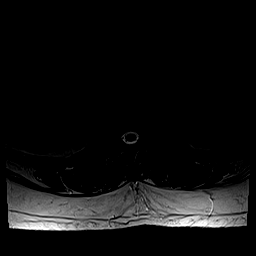
[im 13/29]
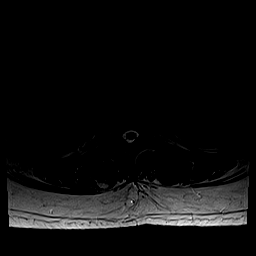
[im 17/29]
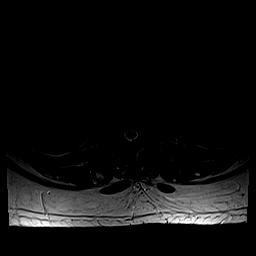
[im 21/29]
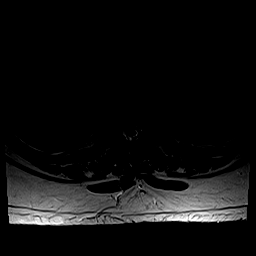
[im 25/29]
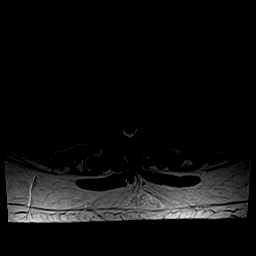
[im 29/29]
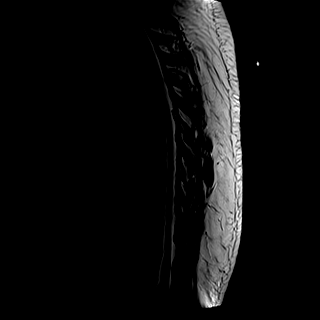

[Series 10: T1 · axial · 5.0mm · 0.43mm/px · z∈[-98,+96]mm · 7 of 27 slices shown (2 of 3)]
[im 1/27]
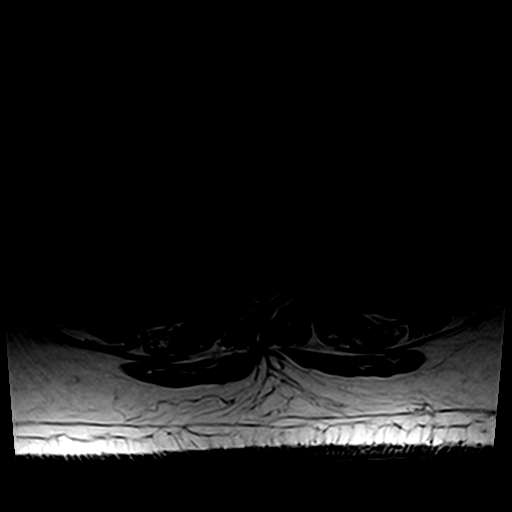
[im 5/27]
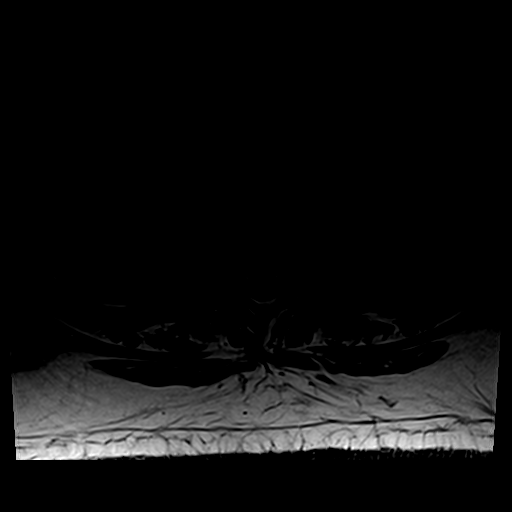
[im 9/27]
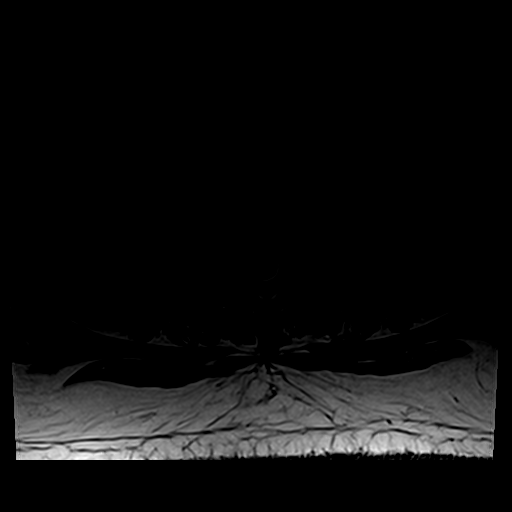
[im 14/27]
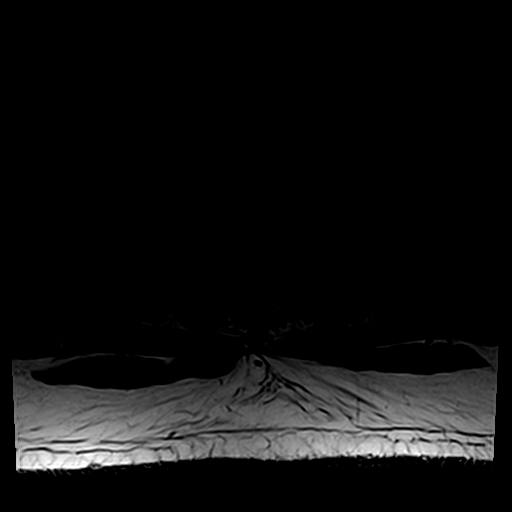
[im 18/27]
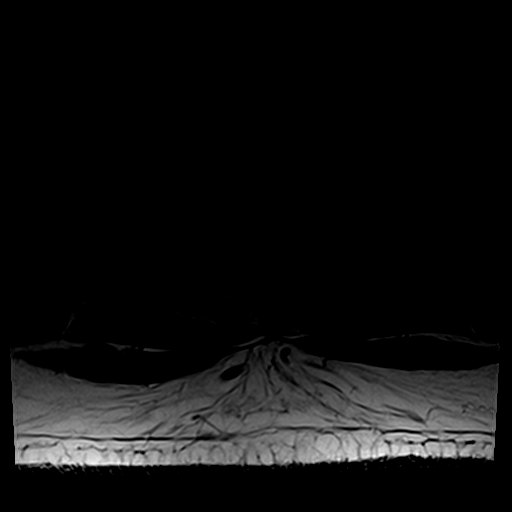
[im 22/27]
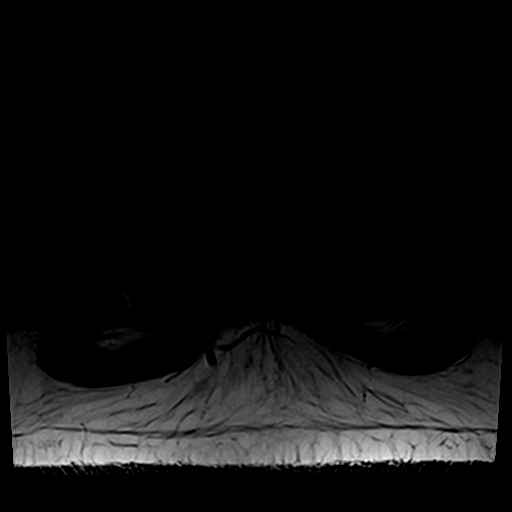
[im 27/27]
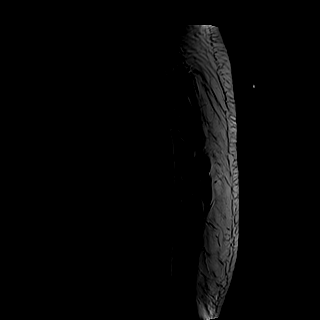

[Series 11: T1 · axial · 5.0mm · 0.43mm/px · z∈[-232,-208]mm · 2 of 29 slices shown (3 of 3)]
[im 1/29]
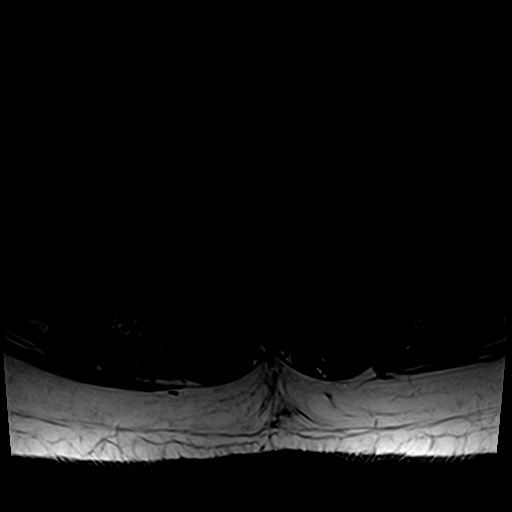
[im 5/29]
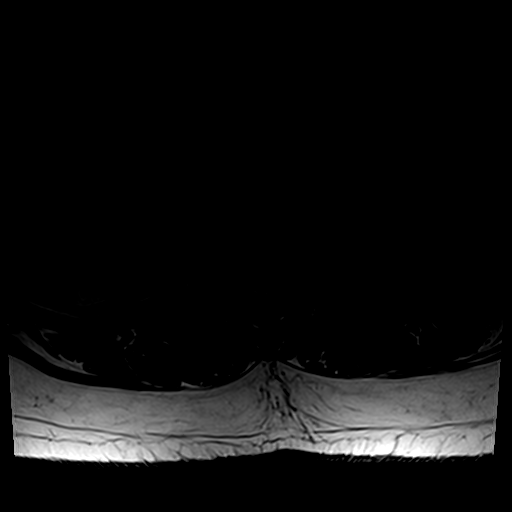

[Series 12: T2 · axial · 5.0mm · 0.86mm/px · z∈[-98,+96]mm · 7 of 27 slices shown (4 of 4)]
[im 1/27]
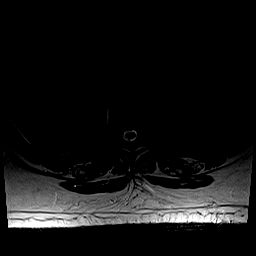
[im 5/27]
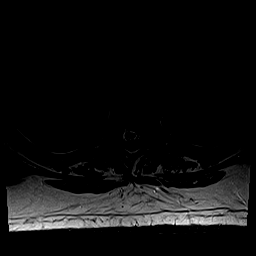
[im 9/27]
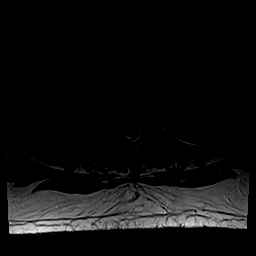
[im 14/27]
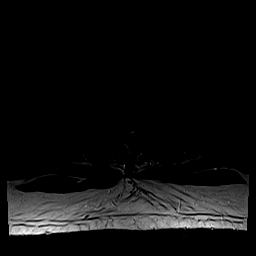
[im 18/27]
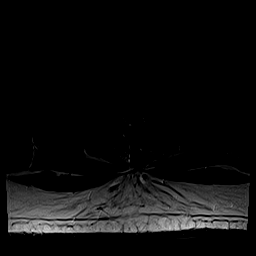
[im 22/27]
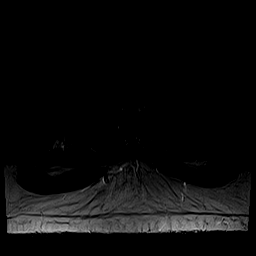
[im 27/27]
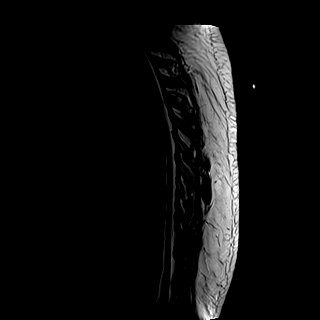

[38 of 48 positions shown; findings below may reference images not displayed]

Comparison Study: No recent.

PROCEDURE AND FINDINGS:   Multiplanar, multisequence imaging of the thoracic
spine is obtained. No evidence of disc protrusion or spinal stenosis.

At the T4 level a fluid collection is noted within the thoracic cord
suggesting a tiny syrinx. A tiny thoracic cord tumor cannot be excluded. Mid
thoracic spine T6-7 small central disc protrusion is noted. There is mild
thecal sac deformity. No high-grade spinal stenosis. Multilevel disc
degeneration is present at all levels. These changes are mild.
IMPRESSION: 1.     Tiny syrinx versus intramedullary tumor versus prominent central
canal noted at the T4 level.
2.     T6-7 small central disc protrusion with mild deformity of the thecal
sac.

Neurosurgical consultation should be considered.

[REDACTED]

## 2013-08-07 IMAGING — MR MR THORACIC SPINE W/ CM
2 series · 19 of 42 positions shown · IV contrast (20cc Multihance)
Comparison: None available.

***ADDENDUM*** CREATED: 12/17/2011 [DATE]

Previous study performed at [REDACTED] on
10/27/2011 was sent for comparison.  There was no report sent for
correlation.  The scan showed a small focus of T2 hyperintensity in
the cord at the T4 level.  There is no enhancement in that region
on the postcontrast study.
CLINICAL DATA: Middle and lower back pain with left-sided flank and
rib pain and left leg burning. Dr. Denstad requests postcontrast
imaging only.
BUN and creatinine were obtained on site at [HOSPITAL] at
[HOSPITAL]..
Results:  BUN 20 mg/dL,  Creatinine 0.8 mg/dL.
MRI THORACIC SPINE WITH CONTRAST
TECHNIQUE: Multiplanar and multiecho pulse sequences of the
thoracic spine were obtained with intravenous contrast.
Contrast: 20mL MULTIHANCE GADOBENATE DIMEGLUMINE 529 MG/ML IV SOLN

[Series 5: T1 fat-sat post-contrast · sagittal · 4.0mm · 0.53mm/px · 9 of 12 slices shown]
[im 1/12]
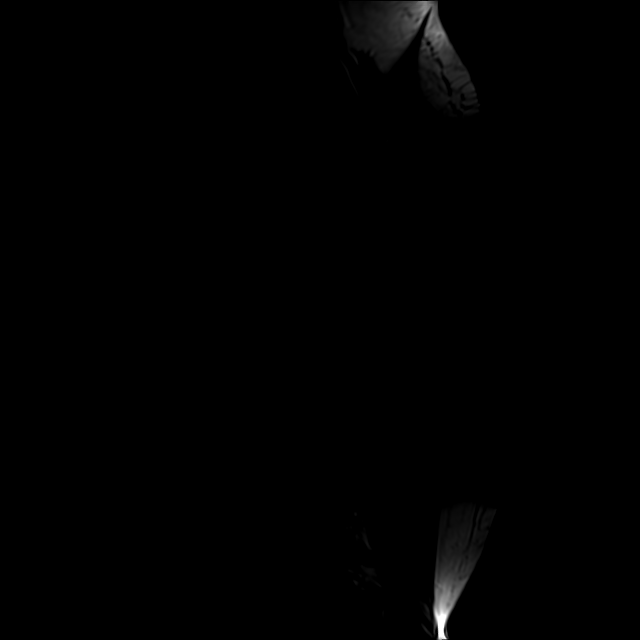
[im 3/12]
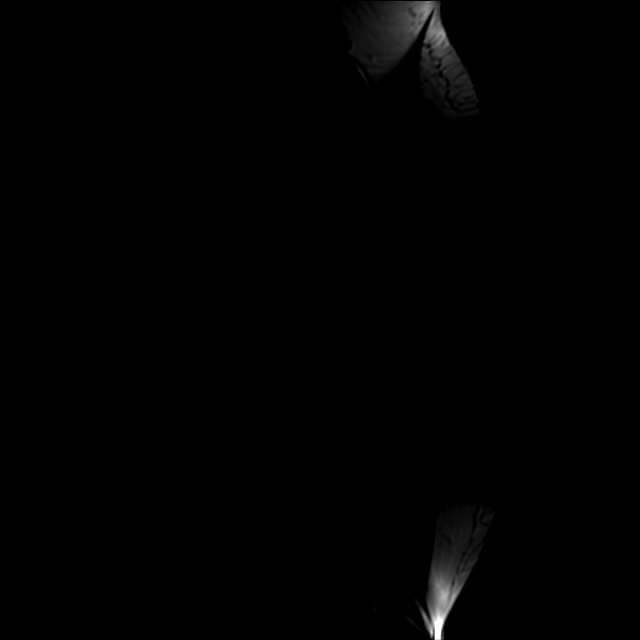
[im 4/12]
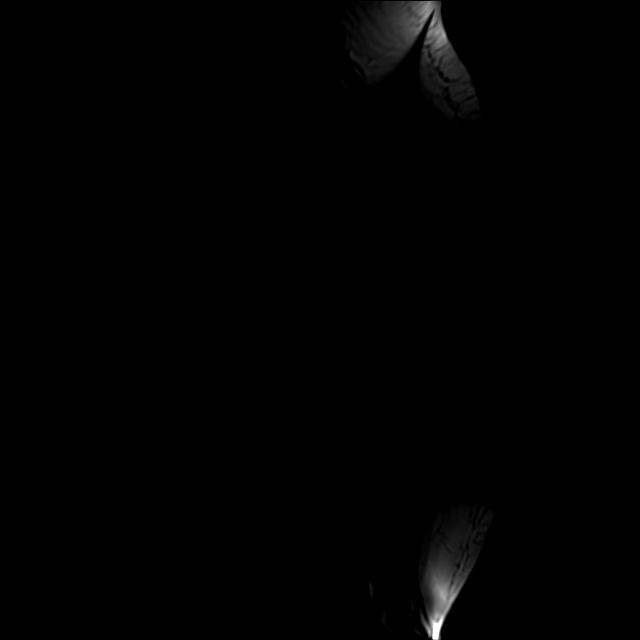
[im 6/12]
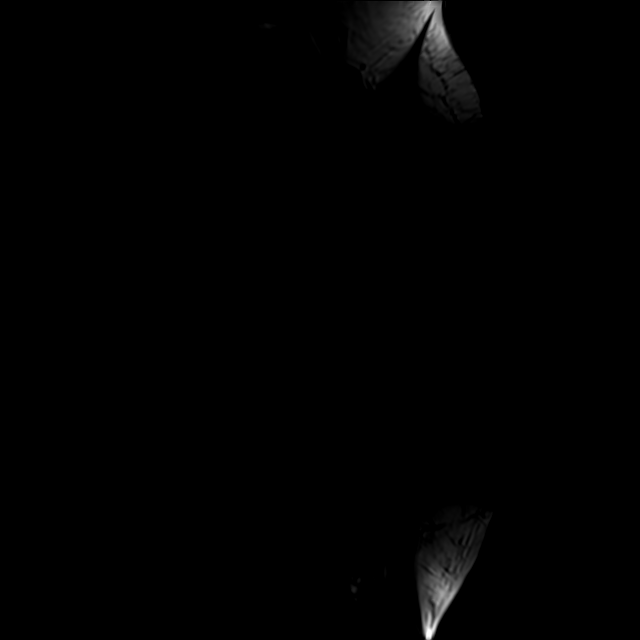
[im 7/12]
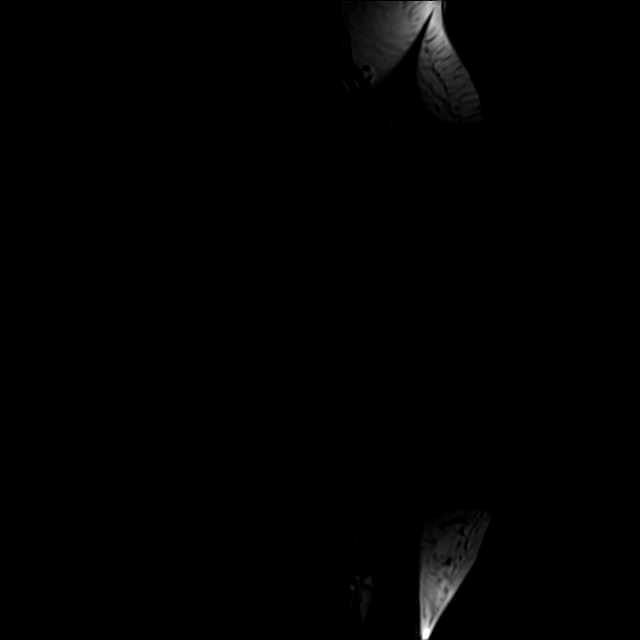
[im 9/12]
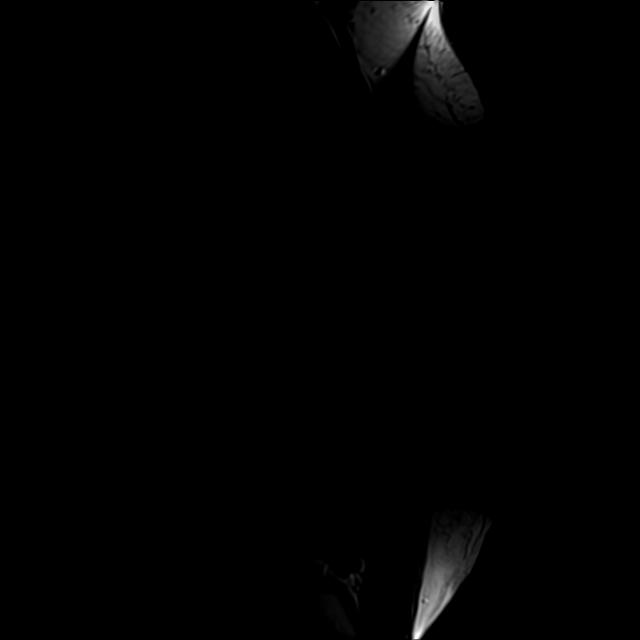
[im 10/12]
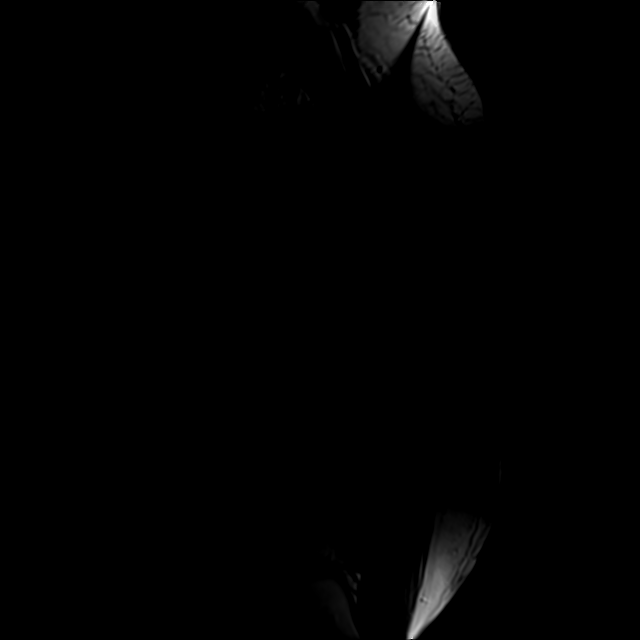
[im 11/12]
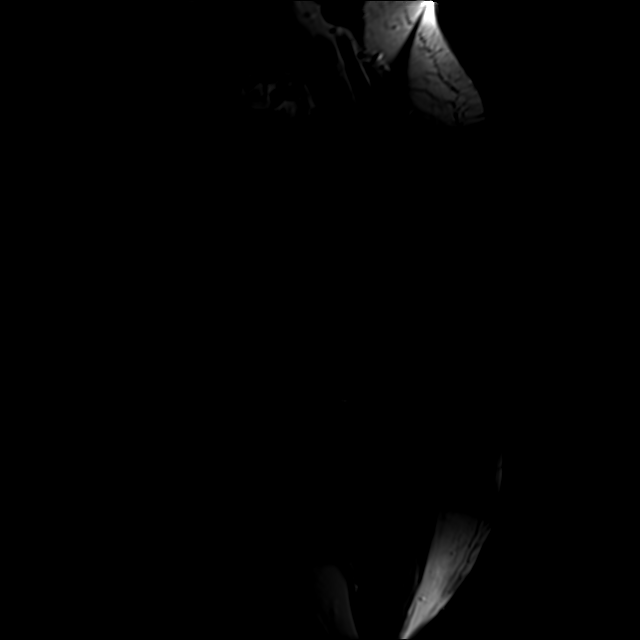
[im 12/12]
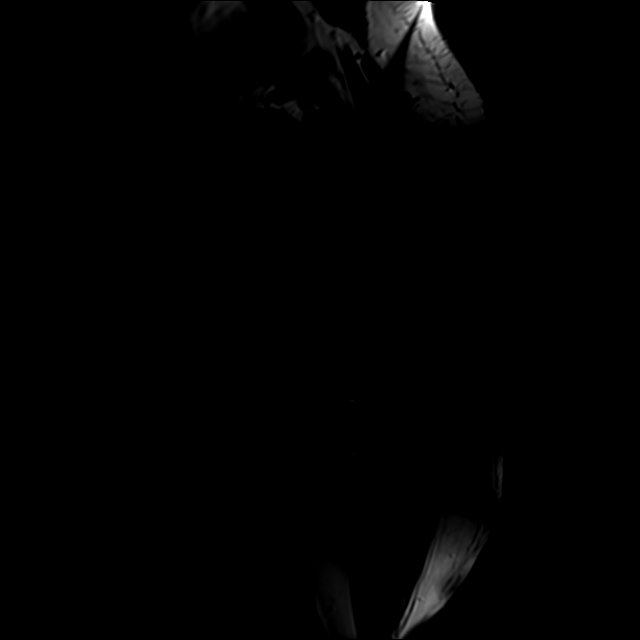

[Series 6: post t1_tse axial · axial · 3.5mm · 0.47mm/px · z∈[-312,-86]mm · 10 of 30 slices shown]
[im 2/30]
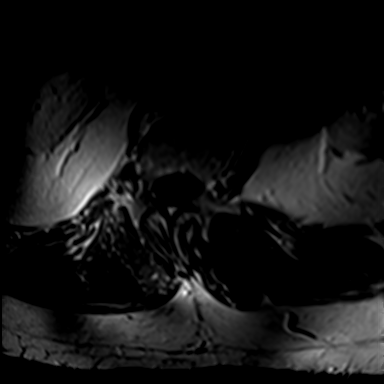
[im 6/30]
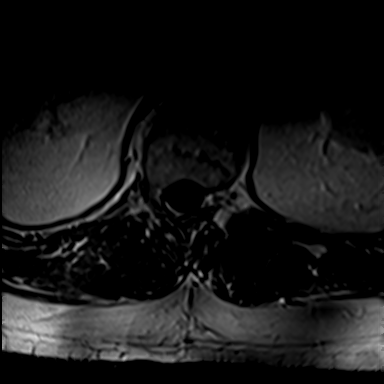
[im 10/30]
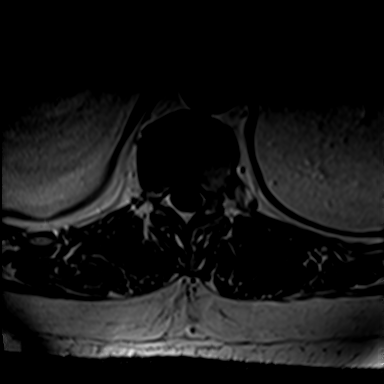
[im 14/30]
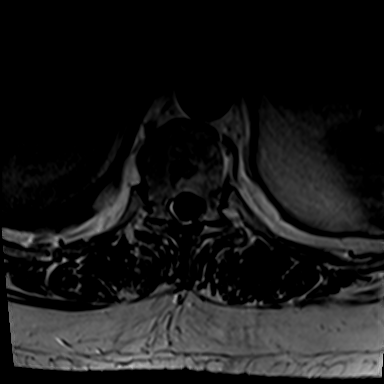
[im 16/30]
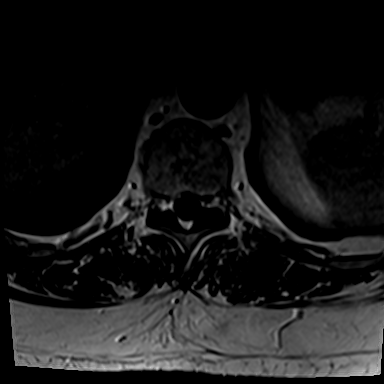
[im 17/30]
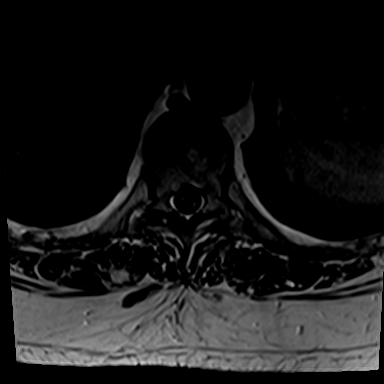
[im 21/30]
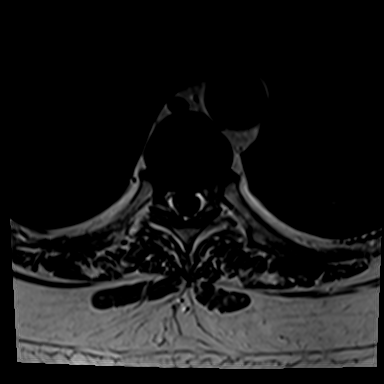
[im 25/30]
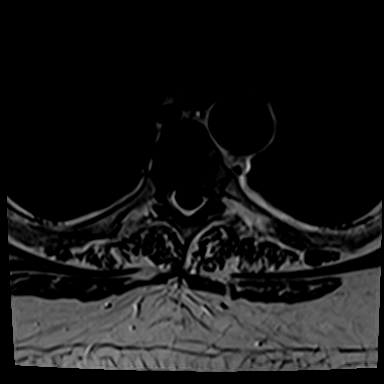
[im 26/30]
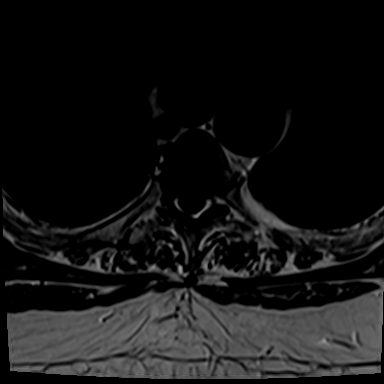
[im 29/30]
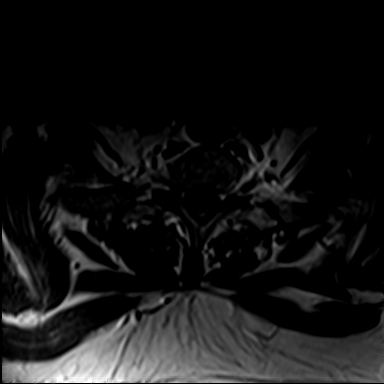

[19 of 42 positions shown; findings below may reference images not displayed]

Where this study was ordered leads me
to believe that there is probably other imaging.  I would be glad
to compare that if made available.
FINDINGS: Scout views in the cervical region show ordinary mild
spondylosis.  This area was not studied in detail.

The thoracic region, I do not see any abnormal enhancing lesion.
Imaging sequences make evaluation of disc detailed difficult. There
are disc protrusions most notable from T5-6 through T8-9.  There
are endplate Schmorl's nodes throughout the mid and lower thoracic
region but these do not show enhancement to suggest active nature.
No facet arthropathy.  No abnormal cord lesion identified.
Paravertebral soft tissues appear unremarkable.
IMPRESSION: No abnormal enhancing lesion identified.  See above discussion.

Degenerative disc disease with disc protrusions throughout the
thoracic region, most notable in the regions from T5-6 through T8-
9.  This detail is poor using this protocol.

## 2013-08-09 ENCOUNTER — Ambulatory Visit: Payer: Self-pay | Admitting: Orthopedic Surgery

## 2013-09-09 ENCOUNTER — Ambulatory Visit: Payer: Self-pay | Admitting: Internal Medicine

## 2013-09-09 ENCOUNTER — Other Ambulatory Visit: Payer: Self-pay | Admitting: Internal Medicine

## 2013-09-09 LAB — D-DIMER(ARMC): D-Dimer: 764 ng/ml

## 2013-11-14 IMAGING — RF DG MYELOGRAM LUMBAR
14 of 19 series · 14 of 19 positions shown · IV contrast (omnipaque)
Comparison: None.

MYELOGRAM INJECTION
TECHNIQUE: Informed consent was obtained from the patient prior to
the procedure, including potential complications of headache,
allergy, infection and pain. Specific instructions were given
regarding 24 hour bedrest post procedure to prevent post-LP
headache.  A timeout procedure was performed.  With the patient
prone, the lower back was prepped with Betadine.  1% Lidocaine was
used for local anesthesia.  Lumbar puncture was performed by the
radiologist at the L3-L4 level using a 22 gauge needle with return
of clear CSF.  15 cc of Omnipaque 180 was injected into the
subarachnoid space. I personally performed the lumbar puncture and
administered the intrathecal contrast. I also personally supervised
acquisition of the myelogram images. .
CLINICAL DATA: Low back pain.  Left leg pain.  No response to
conservative therapy.
TECHNIQUE: Multidetector CT imaging of the lumbar spine was
performed following myelography.  Multiplanar CT image
reconstructions were also generated.

[Series 2: (hospital) · 1 of 1 slices shown]
[im 1/1]
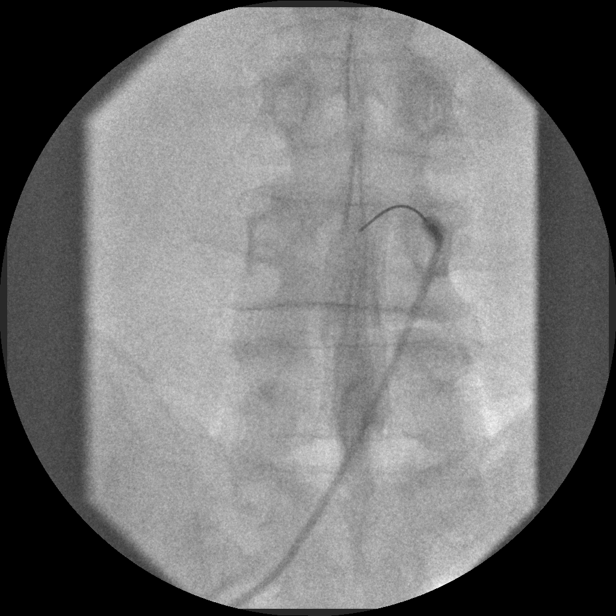

[Series 4: myelogram  white · 1 of 1 slices shown (1 of 9)]
[im 1/1]
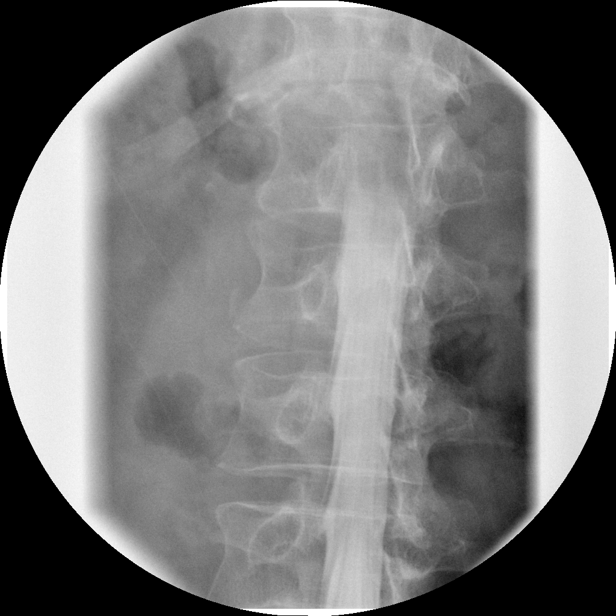

[Series 5: myelogram  white · 1 of 1 slices shown (2 of 9)]
[im 1/1]
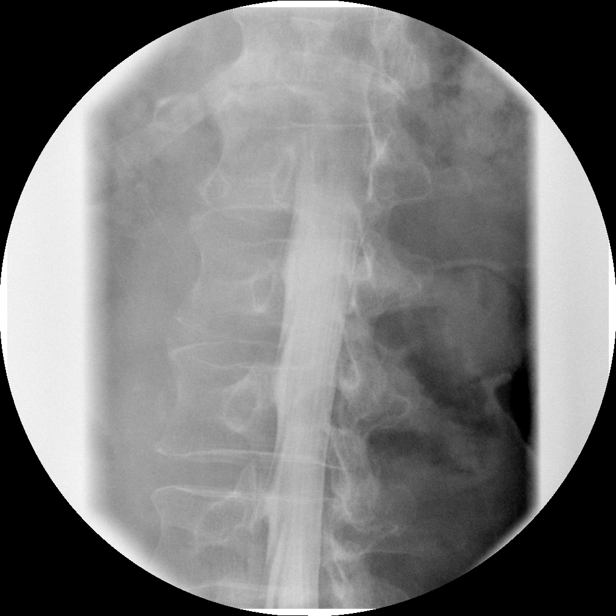

[Series 6: myelogram  white · 1 of 1 slices shown (3 of 9)]
[im 1/1]
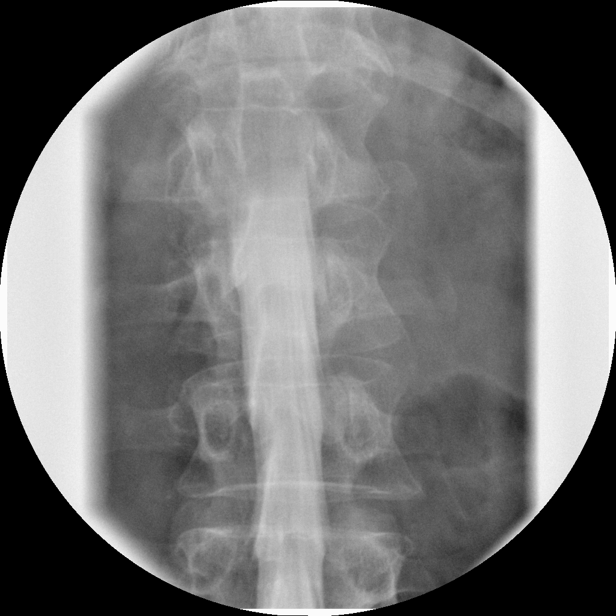

[Series 8: myelogram  white · 1 of 1 slices shown (4 of 9)]
[im 1/1]
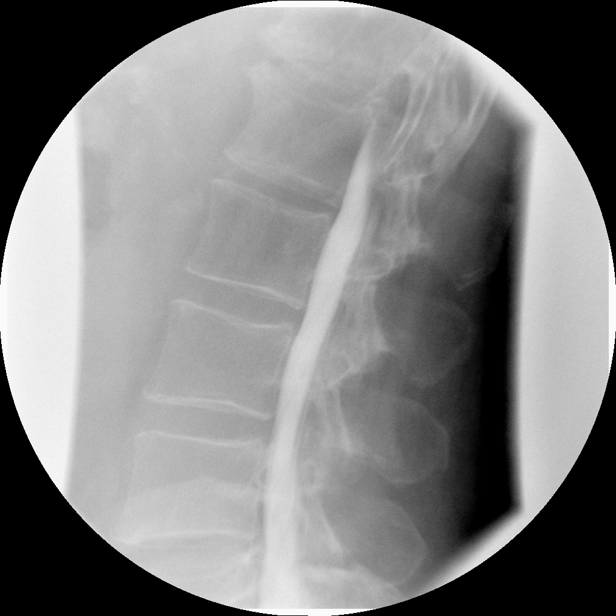

[Series 9: myelogram  white · 1 of 1 slices shown (5 of 9)]
[im 1/1]
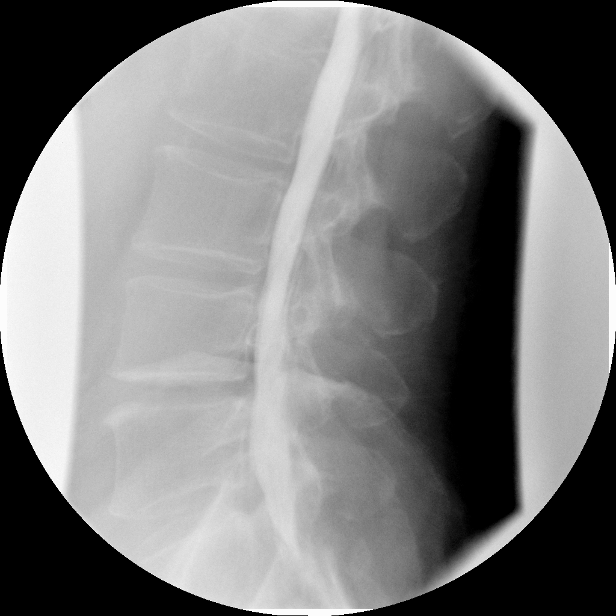

[Series 10: myelogram  white · 1 of 1 slices shown (6 of 9)]
[im 1/1]
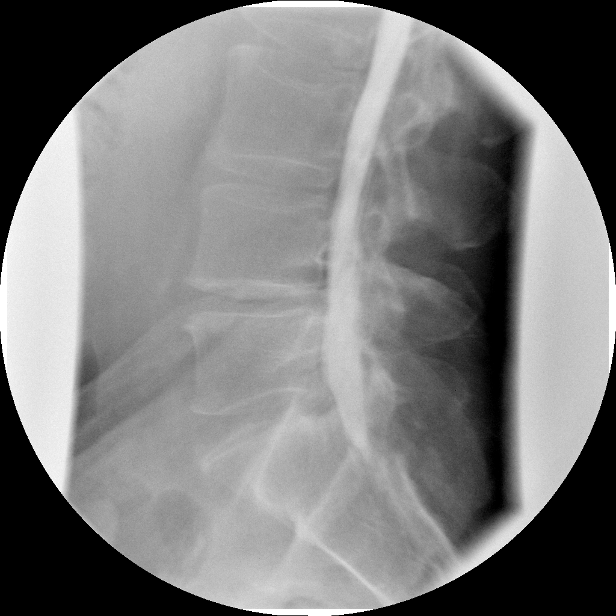

[Series 12: myelogram  white · 1 of 1 slices shown (7 of 9)]
[im 1/1]
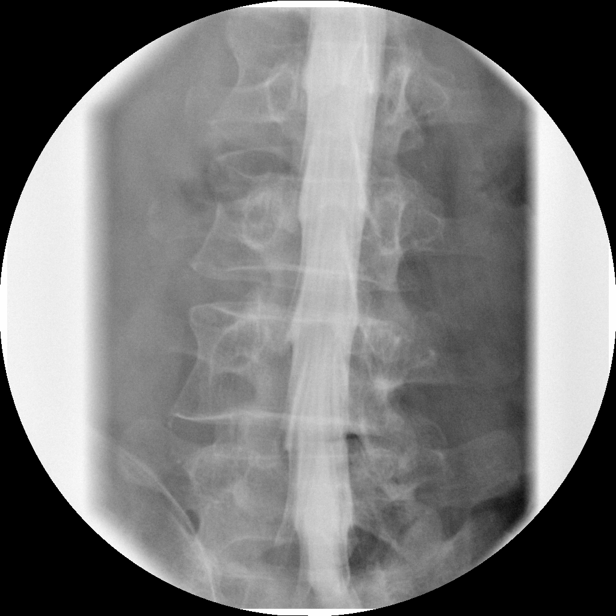

[Series 13: myelogram  white · 1 of 1 slices shown (8 of 9)]
[im 1/1]
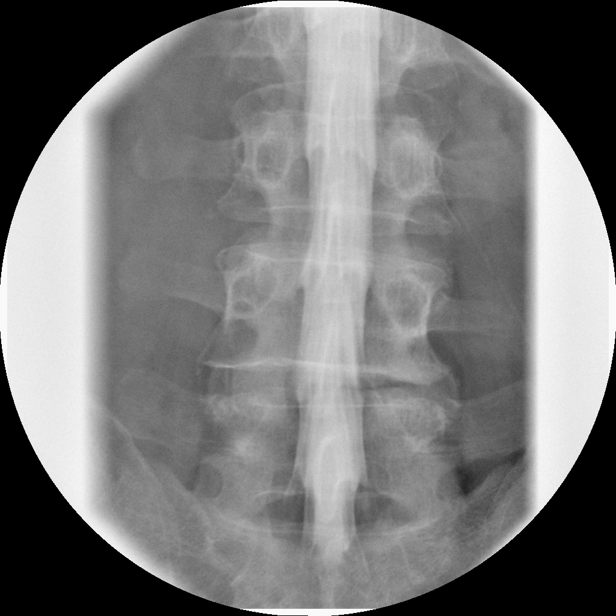

[Series 14: myelogram  white · 1 of 1 slices shown (9 of 9)]
[im 1/1]
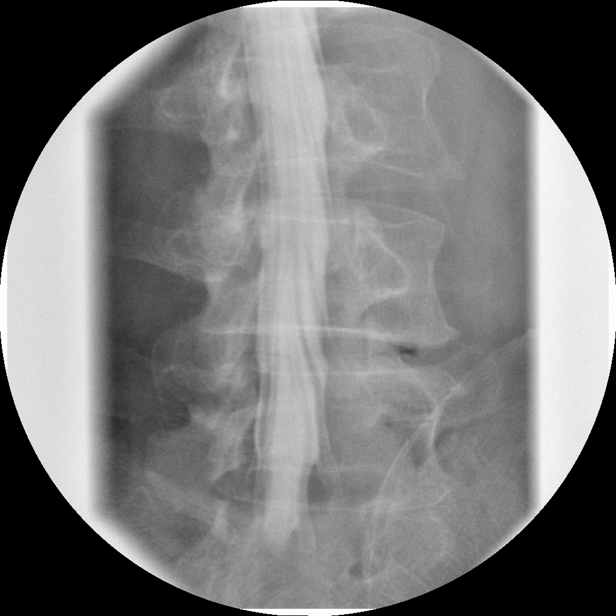

[Series 1001: view not recorded · 0.20mm/px · 1 of 1 slices shown (1 of 4)]
[im 1/1]
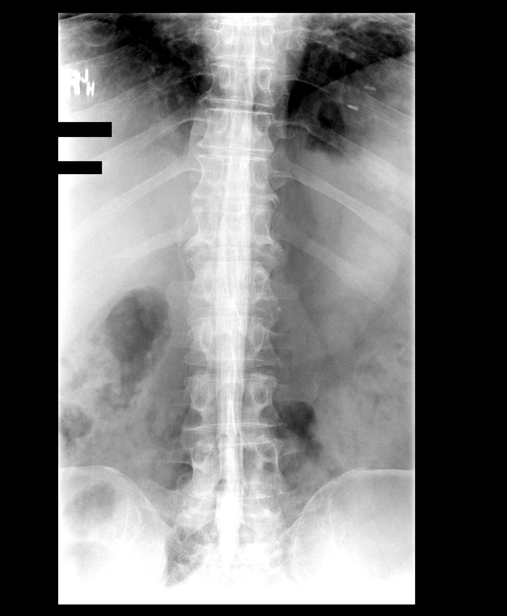

[Series 1002: view not recorded · 0.20mm/px · 1 of 1 slices shown (2 of 4)]
[im 1/1]
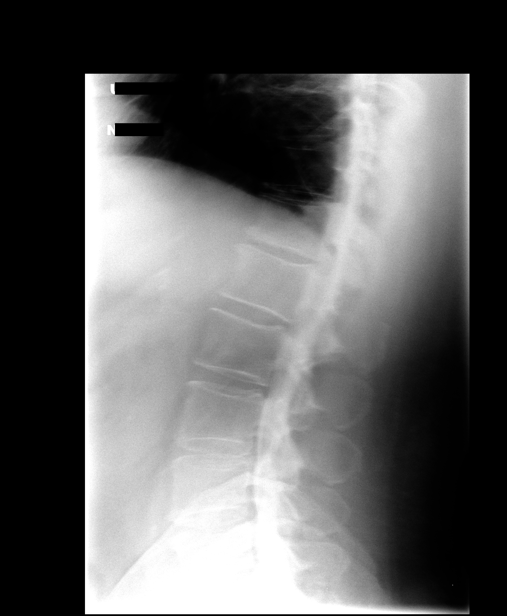

[Series 1003: view not recorded · 0.20mm/px · 1 of 1 slices shown (3 of 4)]
[im 1/1]
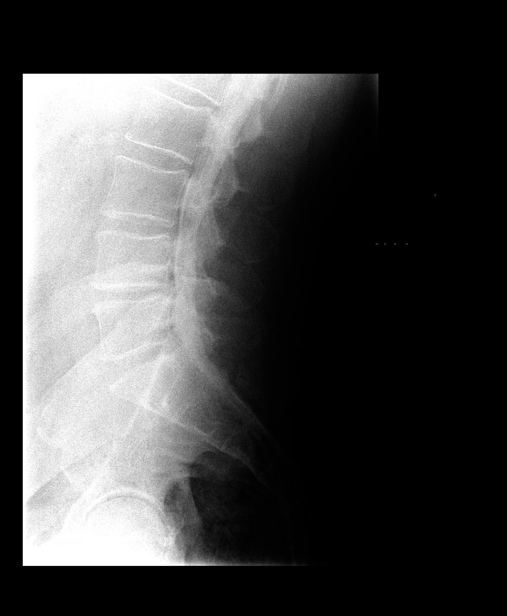

[Series 1005: view not recorded · 0.20mm/px · 1 of 1 slices shown (4 of 4)]
[im 1/1]
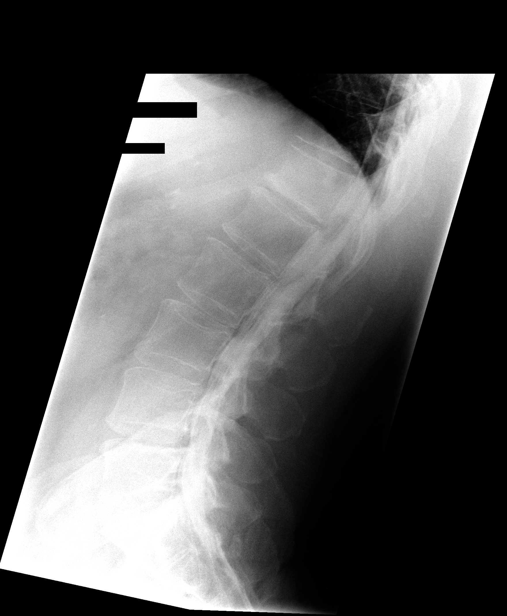

[14 of 19 positions shown; findings below may reference images not displayed]

IMPRESSION: Successful injection of  intrathecal contrast for myelography.

MYELOGRAM LUMBAR
FINDINGS: There is good opacification of the lumbar subarachnoid
space.  Slight underfilling of the right L5 nerve root.  Slight
disc space narrowing L4-L5.  No significant left sided nerve root
encroachment is seen. With the the patient upright, mild stenosis
at L4-5 is more evident. Standing flexion/extension demonstrates no
abnormal movement.

Fluoroscopy Time: 1.21 minutes
IMPRESSION: As above.

CT MYELOGRAPHY LUMBAR SPINE
FINDINGS: No prevertebral or paraspinous masses.

T12-L1:  Central protrusion with disc space narrowing.  No conus
deformity.

L1-2: Conus slightly low.  No stenosis or disc protrusion.

L2-3: Small extraforaminal protrusion is partially calcified on the
left.  There is mild facet arthropathy. No L3 nerve root
encroachment in the canal.  Possible left L2 nerve root
encroachment in the extraforaminal soft tissues, although the L2
nerve root is not compressed in the more proximal left foramen.

L3-4: Broad-based leftward protrusion extends into the foramen and
extraforaminal soft tissues.  There is left greater than right
facet arthropathy.  There is no L4 nerve root encroachment in the
canal.  There is possible left L3 nerve root impingement in the
foramen and extraforaminal soft tissues.

L4-5: Central and rightward protrusion with facet arthropathy.
This protrusion is partially calcified on the left.  Mild central
canal stenosis.  Slight right L5 nerve root displacement.
Asymmetric neural foraminal narrowing on the right could affect the
L4 nerve root.

L5-S1: Central and leftward protrusion.  Mild facet arthropathy.
No S1 nerve root pressure in the canal.  Equivocal right L5 nerve
root displacement in the foramen.
IMPRESSION: Relatively minor disc pathology lateralizing to the left and L2-3,
L3-4, and L5-S1.  Mild central canal stenosis at L4-5 slightly
worse to the right.  No dominant left-sided abnormality.
Multilevel lumbar facet arthropathy.

## 2013-11-14 IMAGING — CT CT L SPINE W/ CM
3 of 10 series · 9 of 27 positions shown, 10 images · IV contrast (omnipaque)
Comparison: None.

MYELOGRAM INJECTION
TECHNIQUE: Informed consent was obtained from the patient prior to
the procedure, including potential complications of headache,
allergy, infection and pain. Specific instructions were given
regarding 24 hour bedrest post procedure to prevent post-LP
headache.  A timeout procedure was performed.  With the patient
prone, the lower back was prepped with Betadine.  1% Lidocaine was
used for local anesthesia.  Lumbar puncture was performed by the
radiologist at the L3-L4 level using a 22 gauge needle with return
of clear CSF.  15 cc of Omnipaque 180 was injected into the
subarachnoid space. I personally performed the lumbar puncture and
administered the intrathecal contrast. I also personally supervised
acquisition of the myelogram images. .
CLINICAL DATA: Low back pain.  Left leg pain.  No response to
conservative therapy.
TECHNIQUE: Multidetector CT imaging of the lumbar spine was
performed following myelography.  Multiplanar CT image
reconstructions were also generated.

[Series 2: l spine bone · axial · 0.27mm/px · z∈[-28,+47]mm · 2 of 92 slices shown, 3 images]
[im 31/92  soft-tissue]
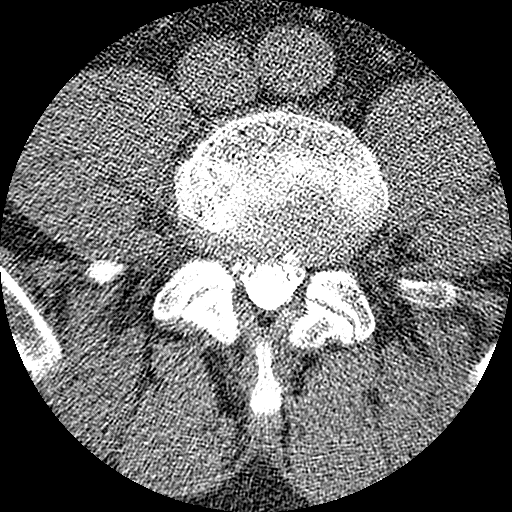
[im 31/92  bone]
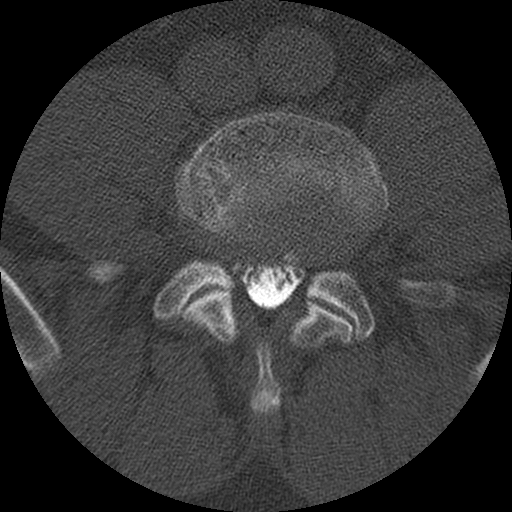
[im 61/92  bone]
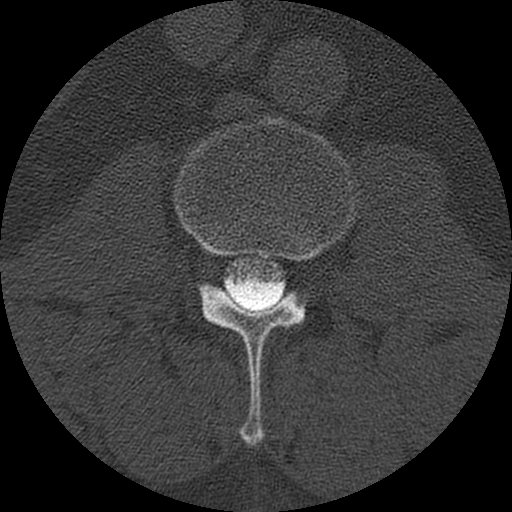

[Series 3: l spine soft · axial · 0.27mm/px · z∈[-28,+47]mm · 2 of 92 slices shown]
[im 31/92  soft-tissue]
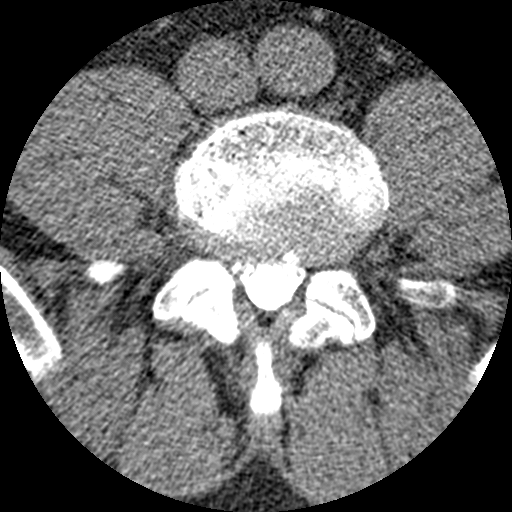
[im 61/92  soft-tissue]
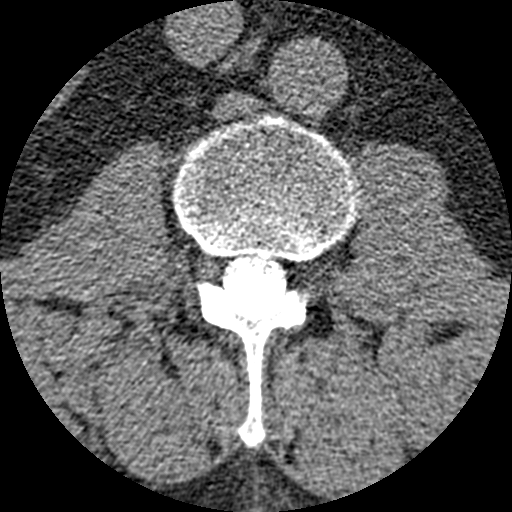

[Series 400: coronal · coronal · 0.46mm/px · 5 of 52 slices shown]
[im 9/52  bone]
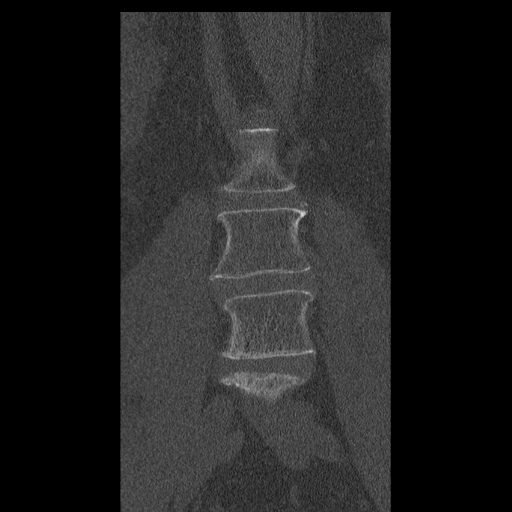
[im 18/52  bone]
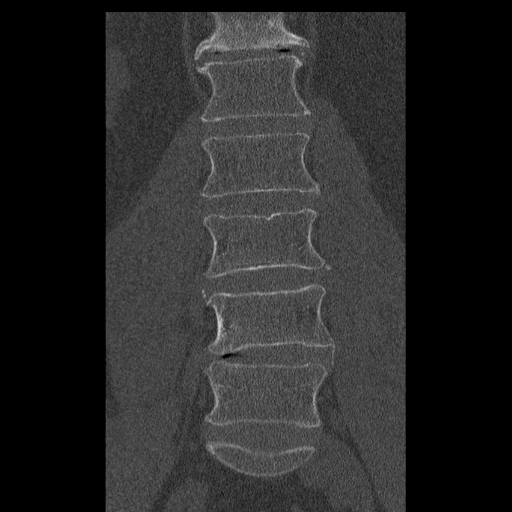
[im 26/52  bone]
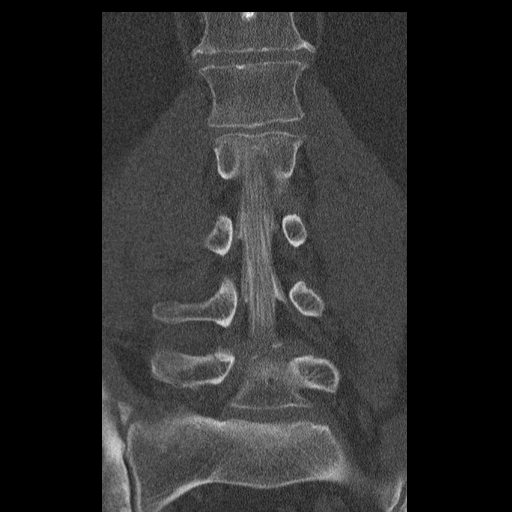
[im 35/52  bone]
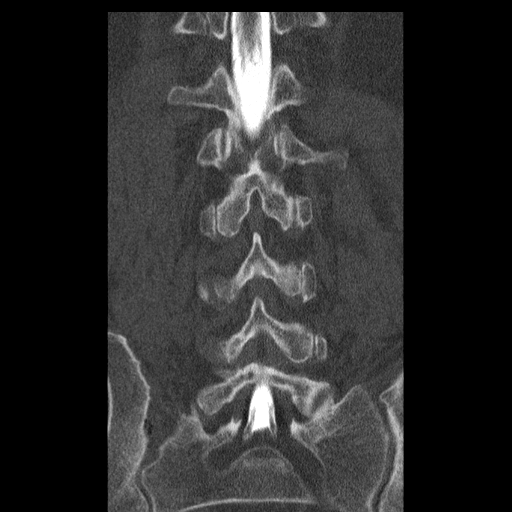
[im 43/52  bone]
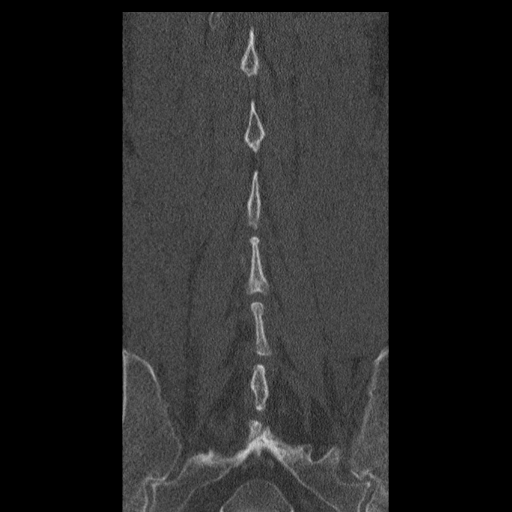

[9 of 27 positions shown; findings below may reference images not displayed]

IMPRESSION: Successful injection of  intrathecal contrast for myelography.

MYELOGRAM LUMBAR
FINDINGS: There is good opacification of the lumbar subarachnoid
space.  Slight underfilling of the right L5 nerve root.  Slight
disc space narrowing L4-L5.  No significant left sided nerve root
encroachment is seen. With the the patient upright, mild stenosis
at L4-5 is more evident. Standing flexion/extension demonstrates no
abnormal movement.

Fluoroscopy Time: 1.21 minutes
IMPRESSION: As above.

CT MYELOGRAPHY LUMBAR SPINE
FINDINGS: No prevertebral or paraspinous masses.

T12-L1:  Central protrusion with disc space narrowing.  No conus
deformity.

L1-2: Conus slightly low.  No stenosis or disc protrusion.

L2-3: Small extraforaminal protrusion is partially calcified on the
left.  There is mild facet arthropathy. No L3 nerve root
encroachment in the canal.  Possible left L2 nerve root
encroachment in the extraforaminal soft tissues, although the L2
nerve root is not compressed in the more proximal left foramen.

L3-4: Broad-based leftward protrusion extends into the foramen and
extraforaminal soft tissues.  There is left greater than right
facet arthropathy.  There is no L4 nerve root encroachment in the
canal.  There is possible left L3 nerve root impingement in the
foramen and extraforaminal soft tissues.

L4-5: Central and rightward protrusion with facet arthropathy.
This protrusion is partially calcified on the left.  Mild central
canal stenosis.  Slight right L5 nerve root displacement.
Asymmetric neural foraminal narrowing on the right could affect the
L4 nerve root.

L5-S1: Central and leftward protrusion.  Mild facet arthropathy.
No S1 nerve root pressure in the canal.  Equivocal right L5 nerve
root displacement in the foramen.
IMPRESSION: Relatively minor disc pathology lateralizing to the left and L2-3,
L3-4, and L5-S1.  Mild central canal stenosis at L4-5 slightly
worse to the right.  No dominant left-sided abnormality.
Multilevel lumbar facet arthropathy.

## 2013-12-17 IMAGING — US US EXTREM LOW VENOUS*L*
1 series · 14 of 23 positions shown · non-contrast
Comparison: none

REASON FOR EXAM: swelling - left leg
COMMENTS:

PROCEDURE:     US  - US DOPPLER LOW EXTR LEFT  - April 21, 2012  [DATE]
RESULT:     Comparison: None

[Series 1: us extrem low venous*left* · 0.12mm/px · 14 of 23 slices shown]
[im 1/23]
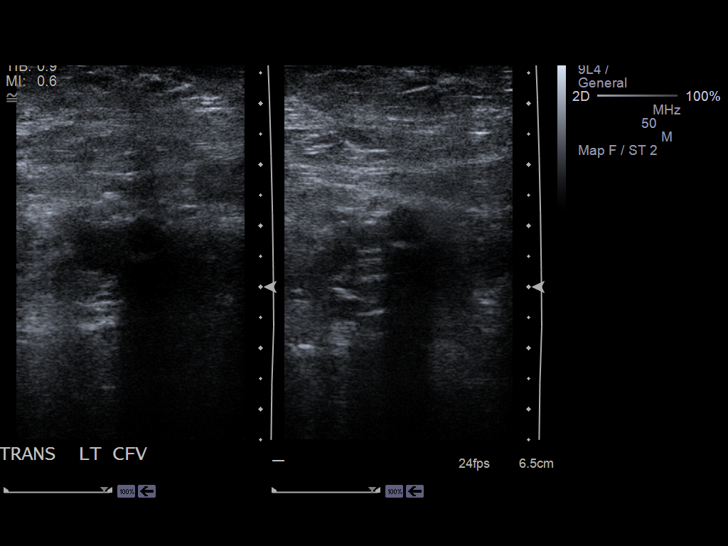
[im 3/23]
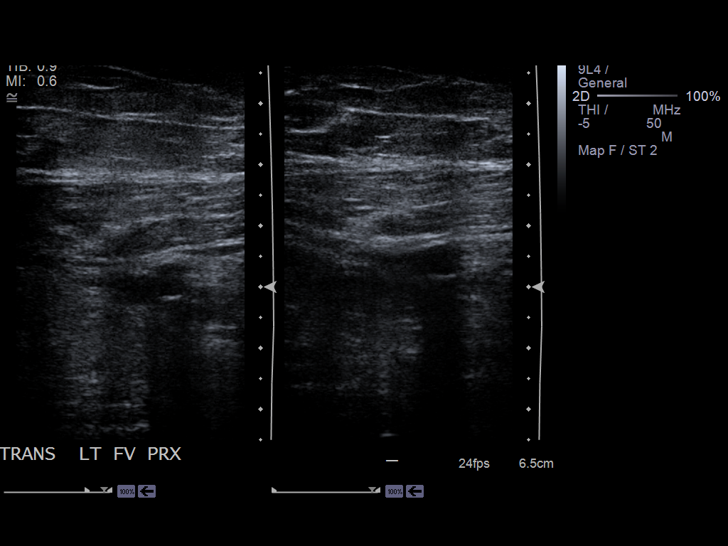
[im 5/23]
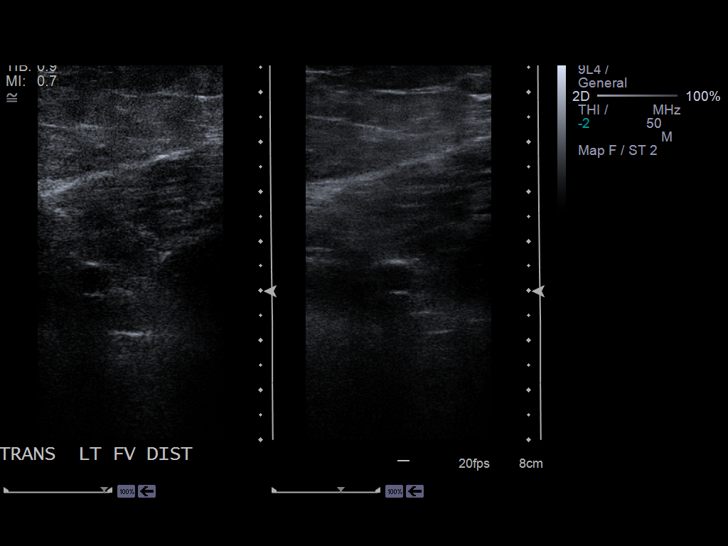
[im 6/23]
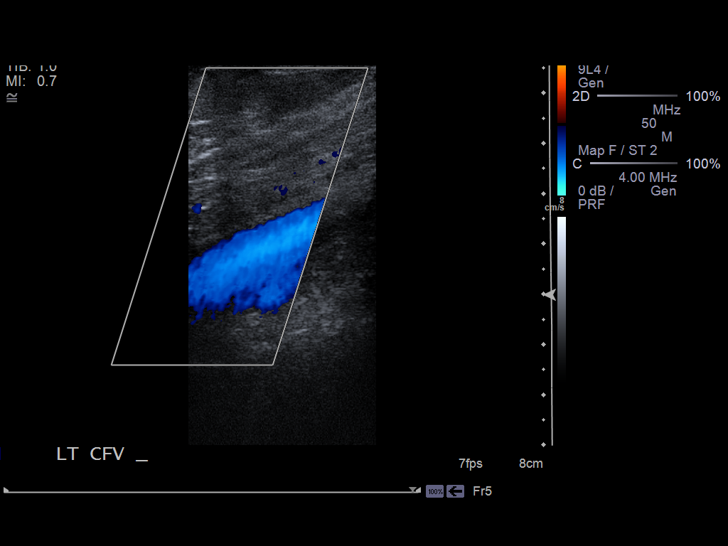
[im 8/23]
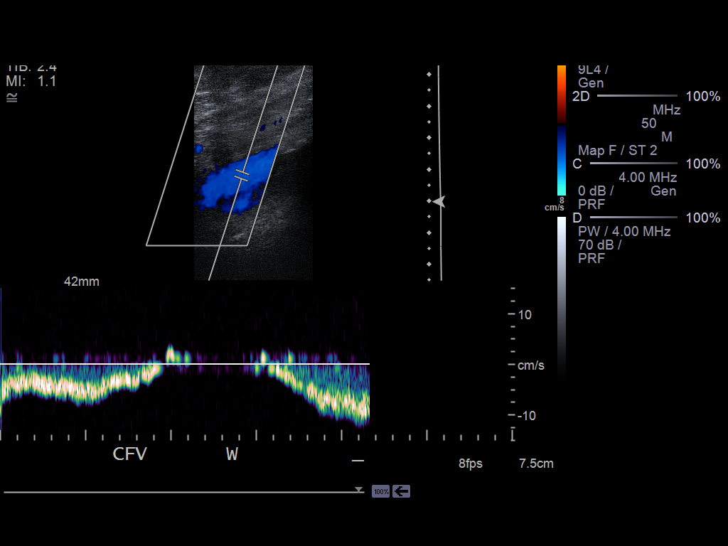
[im 10/23]
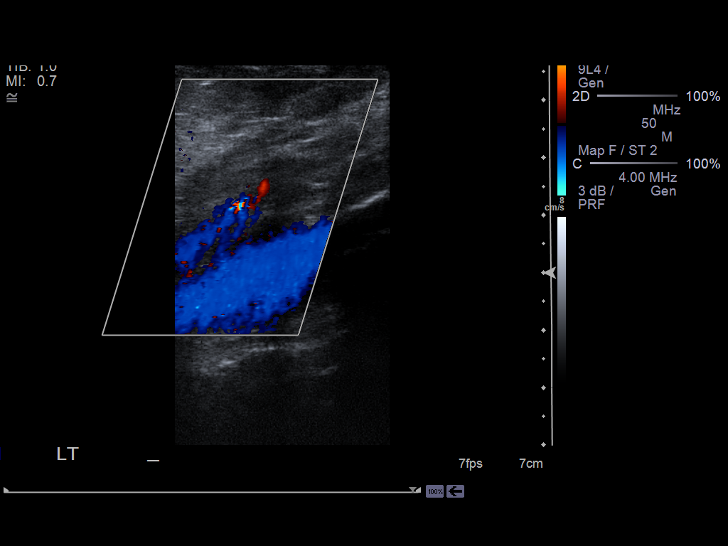
[im 11/23]
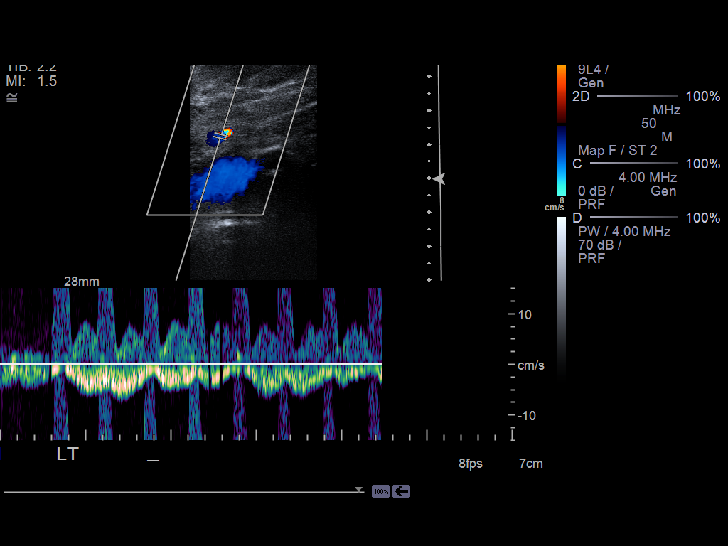
[im 13/23]
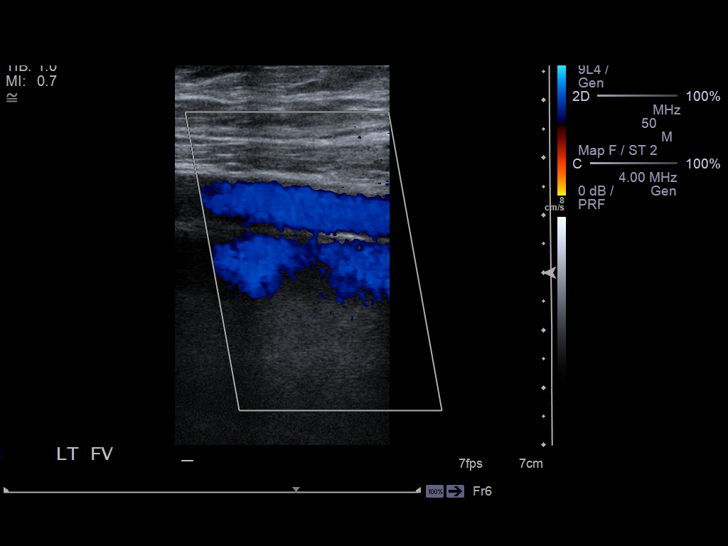
[im 14/23]
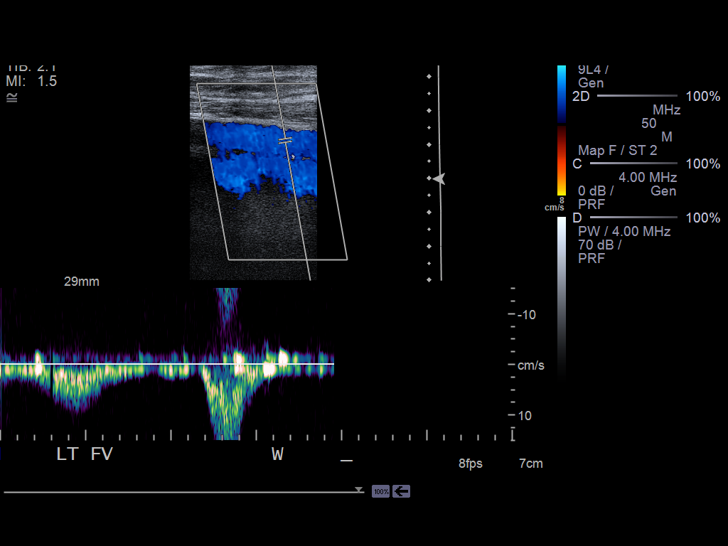
[im 16/23]
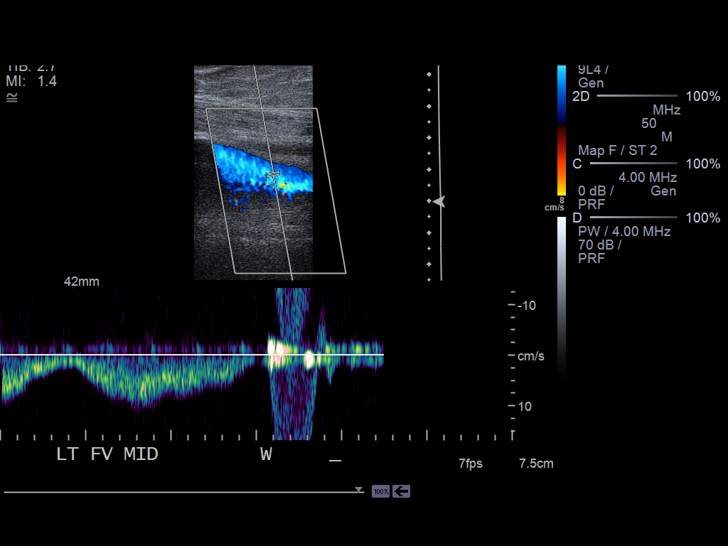
[im 18/23]
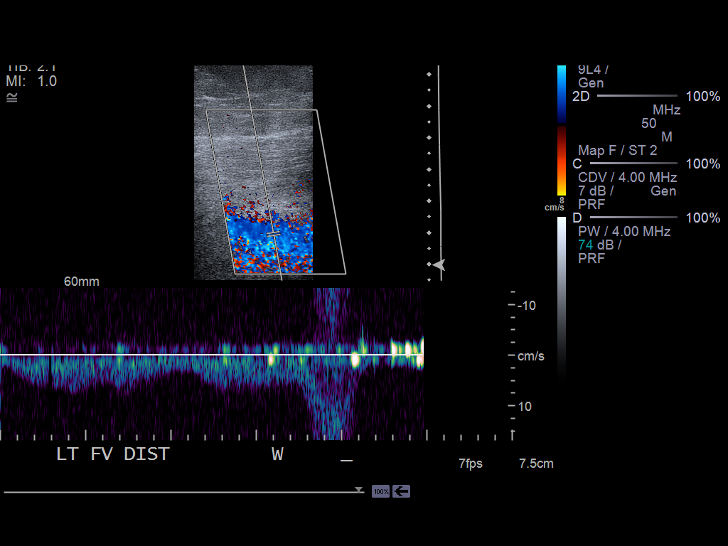
[im 19/23]
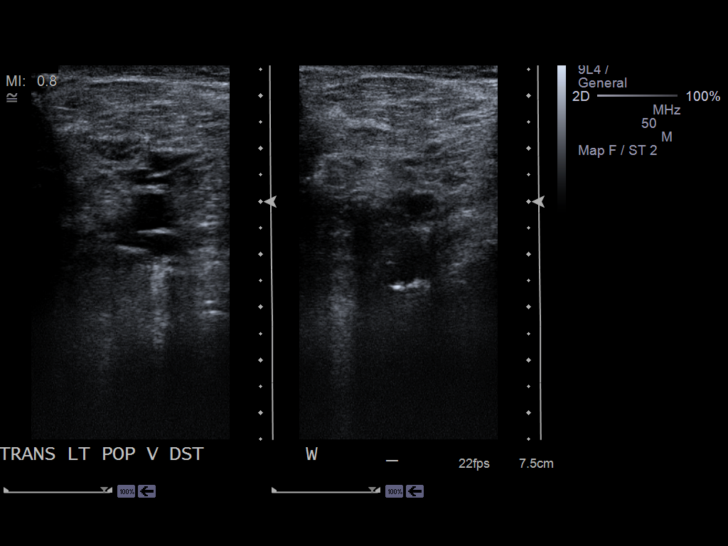
[im 21/23]
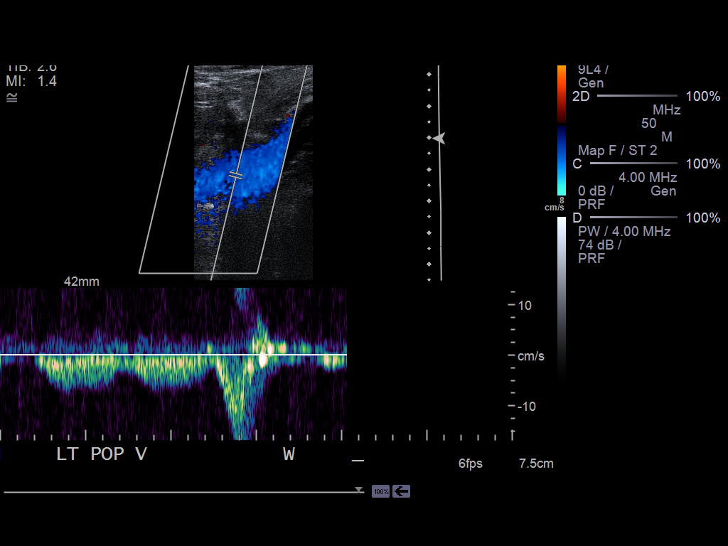
[im 23/23]
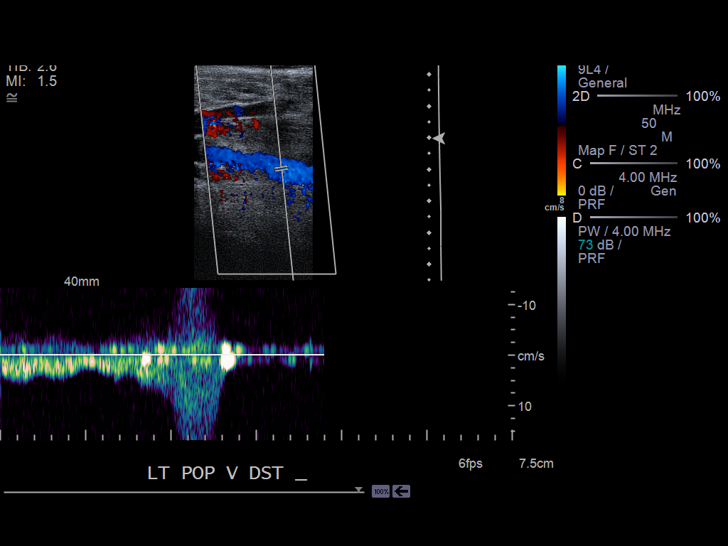

[14 of 23 positions shown; findings below may reference images not displayed]

FINDINGS: Multiple longitudinal and transverse gray-scale as well as color
and spectral Doppler images of the left lower extremity veins were obtained
from the common femoral veins through the popliteal veins.

The left common femoral, greater saphenous, femoral, popliteal veins, and
venous trifurcation are patent, demonstrating normal color-flow and
compressibility. No intraluminal thrombus is identified.There is normal
respiratory variation and augmentation demonstrated at all vein levels.
IMPRESSION: No evidence of DVT in the left lower extremity.

[REDACTED]

## 2013-12-21 ENCOUNTER — Ambulatory Visit: Payer: Self-pay | Admitting: Orthopedic Surgery

## 2013-12-21 LAB — BASIC METABOLIC PANEL
Anion Gap: 10 (ref 7–16)
BUN: 19 mg/dL — AB (ref 7–18)
CO2: 25 mmol/L (ref 21–32)
Calcium, Total: 8.7 mg/dL (ref 8.5–10.1)
Chloride: 102 mmol/L (ref 98–107)
Creatinine: 0.95 mg/dL (ref 0.60–1.30)
EGFR (Non-African Amer.): >60
GLUCOSE: 176 mg/dL — AB (ref 65–99)
Osmolality: 280 (ref 275–301)
POTASSIUM: 4 mmol/L (ref 3.5–5.1)
SODIUM: 137 mmol/L (ref 136–145)

## 2013-12-21 LAB — CBC WITH DIFFERENTIAL/PLATELET
Basophil #: 0.1 10*3/uL (ref 0.0–0.1)
Basophil %: 1.2 %
EOS ABS: 0.3 10*3/uL (ref 0.0–0.7)
Eosinophil %: 5.4 %
HCT: 43.2 % (ref 40.0–52.0)
HGB: 14 g/dL (ref 13.0–18.0)
Lymphocyte #: 1.4 10*3/uL (ref 1.0–3.6)
Lymphocyte %: 22 %
MCH: 29 pg (ref 26.0–34.0)
MCHC: 32.5 g/dL (ref 32.0–36.0)
MCV: 89 fL (ref 80–100)
MONO ABS: 0.5 x10 3/mm (ref 0.2–1.0)
MONOS PCT: 8.5 %
Neutrophil #: 3.9 10*3/uL (ref 1.4–6.5)
Neutrophil %: 62.9 %
Platelet: 140 10*3/uL — ABNORMAL LOW (ref 150–440)
RBC: 4.84 10*6/uL (ref 4.40–5.90)
RDW: 13.6 % (ref 11.5–14.5)
WBC: 6.2 10*3/uL (ref 3.8–10.6)

## 2013-12-22 ENCOUNTER — Ambulatory Visit: Payer: Self-pay | Admitting: Orthopedic Surgery

## 2014-03-11 ENCOUNTER — Inpatient Hospital Stay: Payer: Self-pay | Admitting: Internal Medicine

## 2014-03-11 LAB — COMPREHENSIVE METABOLIC PANEL
ALBUMIN: 3 g/dL — AB (ref 3.4–5.0)
ALK PHOS: 83 U/L
ALT: 50 U/L
AST: 23 U/L (ref 15–37)
Anion Gap: 8 (ref 7–16)
BILIRUBIN TOTAL: 0.3 mg/dL (ref 0.2–1.0)
BUN: 27 mg/dL — AB (ref 7–18)
CALCIUM: 8.1 mg/dL — AB (ref 8.5–10.1)
CHLORIDE: 107 mmol/L (ref 98–107)
Co2: 20 mmol/L — ABNORMAL LOW (ref 21–32)
Creatinine: 1.07 mg/dL (ref 0.60–1.30)
EGFR (African American): >60
EGFR (Non-African Amer.): >60
Glucose: 295 mg/dL — ABNORMAL HIGH (ref 65–99)
OSMOLALITY: 286 (ref 275–301)
Potassium: 4.4 mmol/L (ref 3.5–5.1)
SODIUM: 135 mmol/L — AB (ref 136–145)
TOTAL PROTEIN: 6.6 g/dL (ref 6.4–8.2)

## 2014-03-11 LAB — CBC
HCT: 54.2 % — ABNORMAL HIGH (ref 40.0–52.0)
HGB: 17.5 g/dL (ref 13.0–18.0)
MCH: 29 pg (ref 26.0–34.0)
MCHC: 32.4 g/dL (ref 32.0–36.0)
MCV: 89 fL (ref 80–100)
PLATELETS: 181 10*3/uL (ref 150–440)
RBC: 6.06 10*6/uL — AB (ref 4.40–5.90)
RDW: 14.4 % (ref 11.5–14.5)
WBC: 12.3 10*3/uL — ABNORMAL HIGH (ref 3.8–10.6)

## 2014-03-11 LAB — URINALYSIS, COMPLETE
BLOOD: NEGATIVE
Bacteria: NONE SEEN
Bilirubin,UR: NEGATIVE
Glucose,UR: >=500 mg/dL (ref 0–75)
Leukocyte Esterase: NEGATIVE
Nitrite: NEGATIVE
Ph: 5 (ref 4.5–8.0)
RBC,UR: 2 /HPF (ref 0–5)
Specific Gravity: 1.027 (ref 1.003–1.030)
Squamous Epithelial: <1
WBC UR: 1 /HPF (ref 0–5)

## 2014-03-12 LAB — BASIC METABOLIC PANEL
ANION GAP: 6 — AB (ref 7–16)
BUN: 39 mg/dL — ABNORMAL HIGH (ref 7–18)
CREATININE: 1.43 mg/dL — AB (ref 0.60–1.30)
Calcium, Total: 6.9 mg/dL — CL (ref 8.5–10.1)
Chloride: 108 mmol/L — ABNORMAL HIGH (ref 98–107)
Co2: 22 mmol/L (ref 21–32)
EGFR (African American): >60
EGFR (Non-African Amer.): 54 — ABNORMAL LOW
GLUCOSE: 161 mg/dL — AB (ref 65–99)
OSMOLALITY: 285 (ref 275–301)
Potassium: 3.8 mmol/L (ref 3.5–5.1)
SODIUM: 136 mmol/L (ref 136–145)

## 2014-03-12 LAB — CBC WITH DIFFERENTIAL/PLATELET
Bands: 17 %
EOS PCT: 12 %
HCT: 50 % (ref 40.0–52.0)
HGB: 16.7 g/dL (ref 13.0–18.0)
Lymphocytes: 16 %
MCH: 29.8 pg (ref 26.0–34.0)
MCHC: 33.4 g/dL (ref 32.0–36.0)
MCV: 89 fL (ref 80–100)
MONOS PCT: 14 %
Metamyelocyte: 1 %
PLATELETS: 197 10*3/uL (ref 150–440)
RBC: 5.61 10*6/uL (ref 4.40–5.90)
RDW: 14.1 % (ref 11.5–14.5)
Segmented Neutrophils: 37 %
Variant Lymphocyte - H1-Rlymph: 3 %
WBC: 10.4 10*3/uL (ref 3.8–10.6)

## 2014-03-12 LAB — POTASSIUM: Potassium: 3.8 mmol/L (ref 3.5–5.1)

## 2014-03-12 LAB — CLOSTRIDIUM DIFFICILE(ARMC)

## 2014-03-13 LAB — BASIC METABOLIC PANEL
Anion Gap: 6 — ABNORMAL LOW (ref 7–16)
BUN: 21 mg/dL — ABNORMAL HIGH (ref 7–18)
CREATININE: 0.91 mg/dL (ref 0.60–1.30)
Calcium, Total: 6.9 mg/dL — CL (ref 8.5–10.1)
Chloride: 111 mmol/L — ABNORMAL HIGH (ref 98–107)
Co2: 23 mmol/L (ref 21–32)
EGFR (African American): >60
EGFR (Non-African Amer.): >60
GLUCOSE: 135 mg/dL — AB (ref 65–99)
Osmolality: 284 (ref 275–301)
POTASSIUM: 3.4 mmol/L — AB (ref 3.5–5.1)
Sodium: 140 mmol/L (ref 136–145)

## 2014-03-13 LAB — URINE CULTURE

## 2014-03-14 LAB — BASIC METABOLIC PANEL
Anion Gap: 7 (ref 7–16)
BUN: 12 mg/dL (ref 7–18)
Calcium, Total: 7.4 mg/dL — ABNORMAL LOW (ref 8.5–10.1)
Chloride: 114 mmol/L — ABNORMAL HIGH (ref 98–107)
Co2: 21 mmol/L (ref 21–32)
Creatinine: 0.74 mg/dL (ref 0.60–1.30)
GLUCOSE: 113 mg/dL — AB (ref 65–99)
OSMOLALITY: 284 (ref 275–301)
Potassium: 4.1 mmol/L (ref 3.5–5.1)
SODIUM: 142 mmol/L (ref 136–145)

## 2014-03-14 LAB — CBC WITH DIFFERENTIAL/PLATELET
BASOS ABS: 0 10*3/uL (ref 0.0–0.1)
Basophil %: 0.3 %
EOS ABS: 3.2 10*3/uL — AB (ref 0.0–0.7)
Eosinophil %: 31.5 %
HCT: 43.7 % (ref 40.0–52.0)
HGB: 14.4 g/dL (ref 13.0–18.0)
LYMPHS ABS: 2.2 10*3/uL (ref 1.0–3.6)
Lymphocyte %: 21.6 %
MCH: 29.6 pg (ref 26.0–34.0)
MCHC: 32.8 g/dL (ref 32.0–36.0)
MCV: 90 fL (ref 80–100)
Monocyte #: 1.2 x10 3/mm — ABNORMAL HIGH (ref 0.2–1.0)
Monocyte %: 12.4 %
NEUTROS PCT: 34.2 %
Neutrophil #: 3.4 10*3/uL (ref 1.4–6.5)
PLATELETS: 138 10*3/uL — AB (ref 150–440)
RBC: 4.85 10*6/uL (ref 4.40–5.90)
RDW: 14.2 % (ref 11.5–14.5)
WBC: 10 10*3/uL (ref 3.8–10.6)

## 2014-03-17 ENCOUNTER — Emergency Department: Payer: Self-pay | Admitting: Emergency Medicine

## 2014-03-17 LAB — URINALYSIS, COMPLETE
Bacteria: NONE SEEN
Bilirubin,UR: NEGATIVE
Blood: NEGATIVE
Glucose,UR: >=500 mg/dL (ref 0–75)
Hyaline Cast: 5
Leukocyte Esterase: NEGATIVE
Nitrite: NEGATIVE
Ph: 5 (ref 4.5–8.0)
SPECIFIC GRAVITY: 1.018 (ref 1.003–1.030)
Squamous Epithelial: 1

## 2014-03-17 LAB — COMPREHENSIVE METABOLIC PANEL
ALBUMIN: 2.9 g/dL — AB (ref 3.4–5.0)
ANION GAP: 12 (ref 7–16)
Alkaline Phosphatase: 104 U/L
BUN: 16 mg/dL (ref 7–18)
Bilirubin,Total: 0.3 mg/dL (ref 0.2–1.0)
CALCIUM: 8.6 mg/dL (ref 8.5–10.1)
CO2: 24 mmol/L (ref 21–32)
CREATININE: 1.31 mg/dL — AB (ref 0.60–1.30)
Chloride: 104 mmol/L (ref 98–107)
EGFR (African American): >60
EGFR (Non-African Amer.): 60 — ABNORMAL LOW
GLUCOSE: 224 mg/dL — AB (ref 65–99)
OSMOLALITY: 288 (ref 275–301)
POTASSIUM: 3.9 mmol/L (ref 3.5–5.1)
SGOT(AST): 18 U/L (ref 15–37)
SGPT (ALT): 40 U/L
SODIUM: 140 mmol/L (ref 136–145)
Total Protein: 6.6 g/dL (ref 6.4–8.2)

## 2014-03-17 LAB — CBC WITH DIFFERENTIAL/PLATELET
Bands: 3 %
Basophil: 1 %
Eosinophil: 24 %
HCT: 55 % — AB (ref 40.0–52.0)
HGB: 18 g/dL (ref 13.0–18.0)
LYMPHS PCT: 17 %
MCH: 29.3 pg (ref 26.0–34.0)
MCHC: 32.6 g/dL (ref 32.0–36.0)
MCV: 90 fL (ref 80–100)
Monocytes: 7 %
Platelet: 195 10*3/uL (ref 150–440)
RBC: 6.14 10*6/uL — ABNORMAL HIGH (ref 4.40–5.90)
RDW: 14.4 % (ref 11.5–14.5)
SEGMENTED NEUTROPHILS: 46 %
Variant Lymphocyte - H1-Rlymph: 2 %
WBC: 17.4 10*3/uL — ABNORMAL HIGH (ref 3.8–10.6)

## 2014-03-17 LAB — CLOSTRIDIUM DIFFICILE(ARMC)

## 2014-03-17 LAB — LIPASE, BLOOD: LIPASE: 82 U/L (ref 73–393)

## 2014-03-30 ENCOUNTER — Ambulatory Visit: Payer: Self-pay | Admitting: Unknown Physician Specialty

## 2014-03-30 LAB — CLOSTRIDIUM DIFFICILE(ARMC)

## 2014-04-01 LAB — STOOL CULTURE

## 2014-04-19 IMAGING — CR DG CHEST 1V PORT
1 series · 1 of 1 positions shown · non-contrast
Comparison: none

REASON FOR EXAM: Chest Pain
COMMENTS:

PROCEDURE:     DXR - DXR PORTABLE CHEST SINGLE VIEW  - August 22, 2012  [DATE]
RESULT:     Comparison is made to a prior study dated 04/21/2012.

[ap]
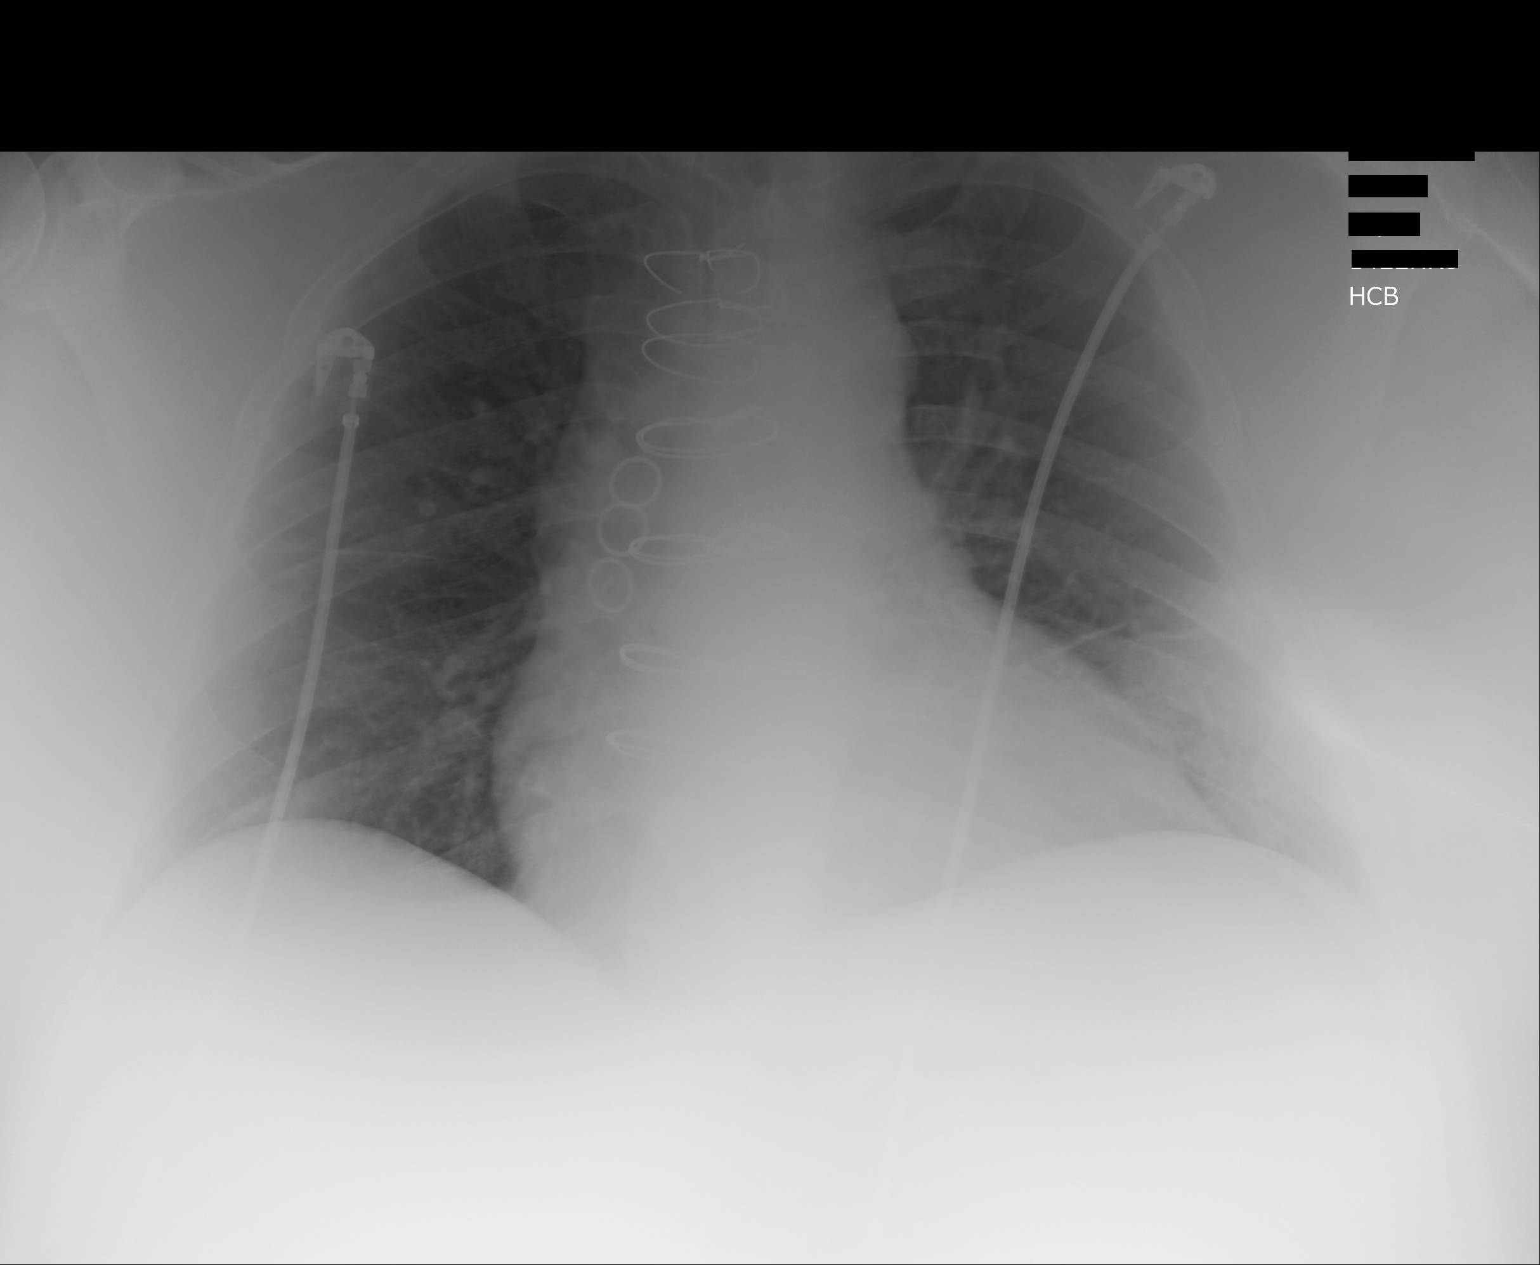

[1 of 1 positions shown; findings below may reference images not displayed]

FINDINGS: The patient has taken a shallow inspiration. With technique taken
into consideration, there is no evidence of focal infiltrates, effusions or
edema. The patient is status post median sternotomy and coronary artery
bypass grafting. The cardiac silhouette is moderately enlarged.
IMPRESSION: The patient has taken a shallow inspiration evidence of
acute cardiopulmonary disease.

## 2014-04-20 IMAGING — CR DG CHEST 2V
1 series · 2 of 2 positions shown · non-contrast
Comparison: none

REASON FOR EXAM: SOB
COMMENTS:

[Series 1: pa · 0.17mm/px · 2 of 2 slices shown]
[im 1/2]
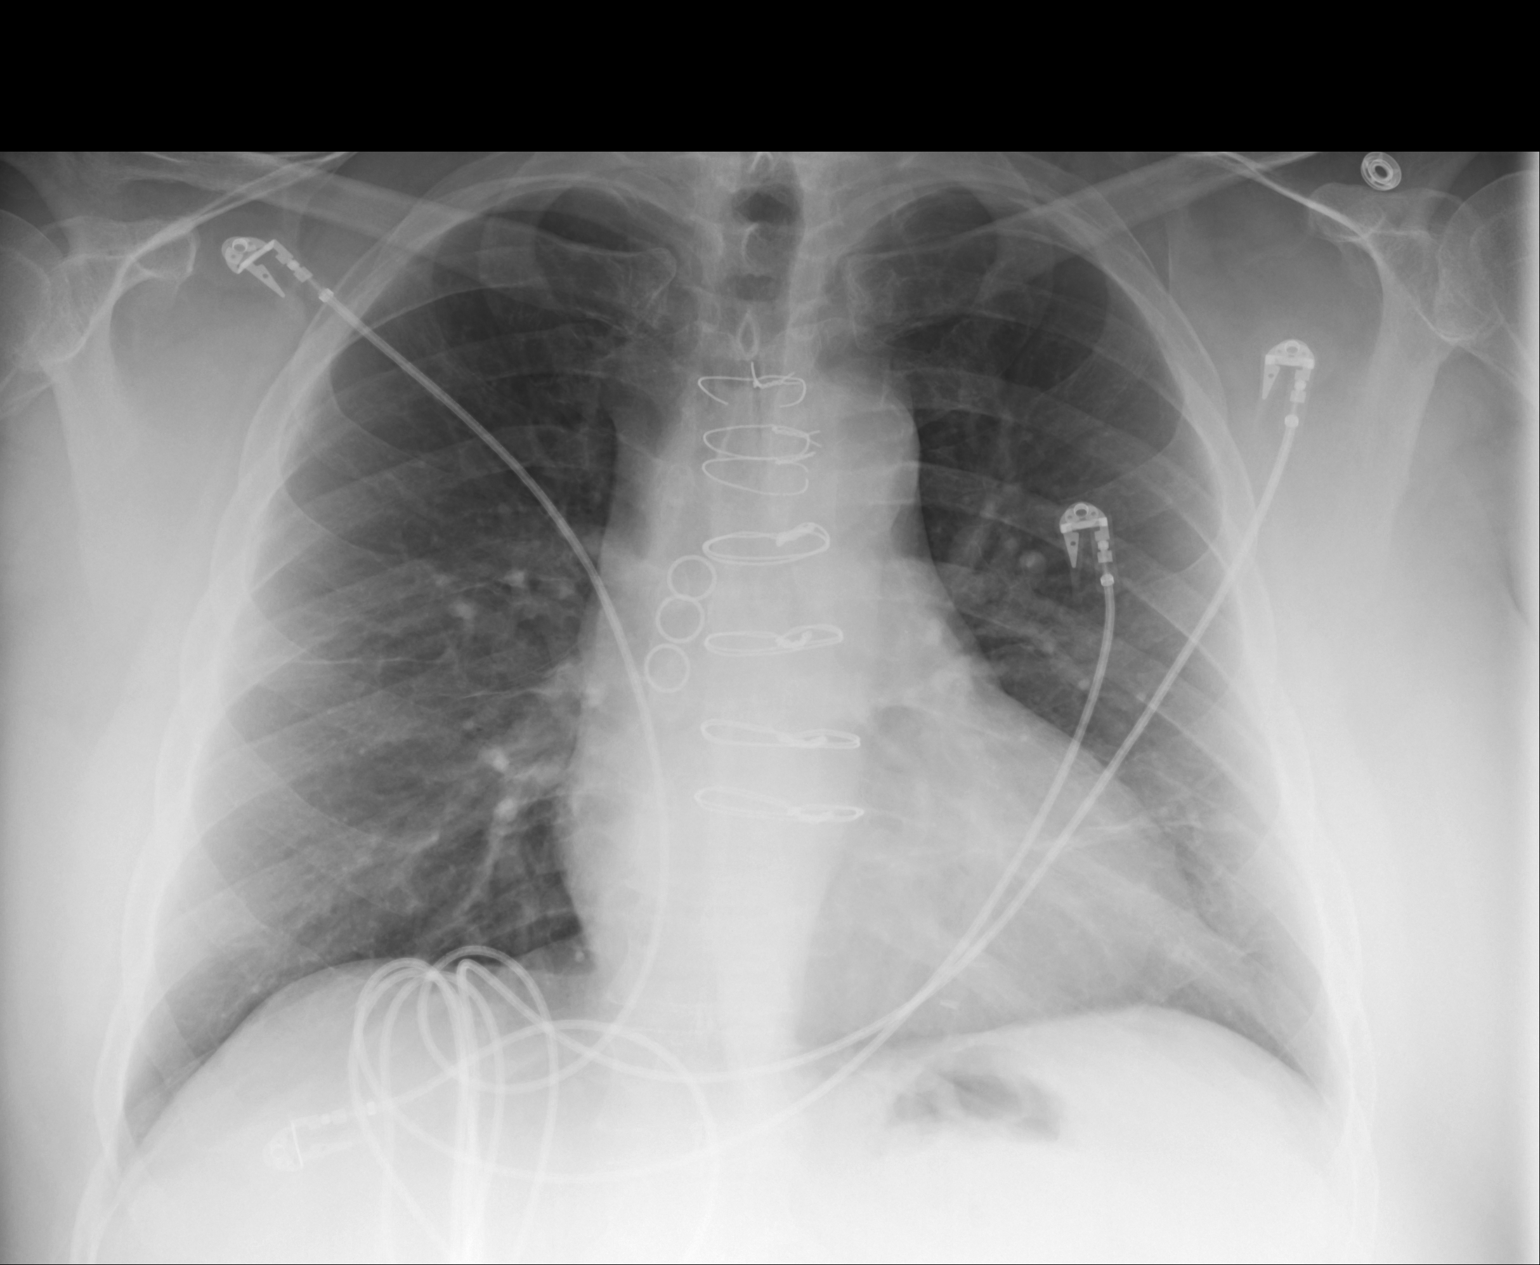
[im 2/2]
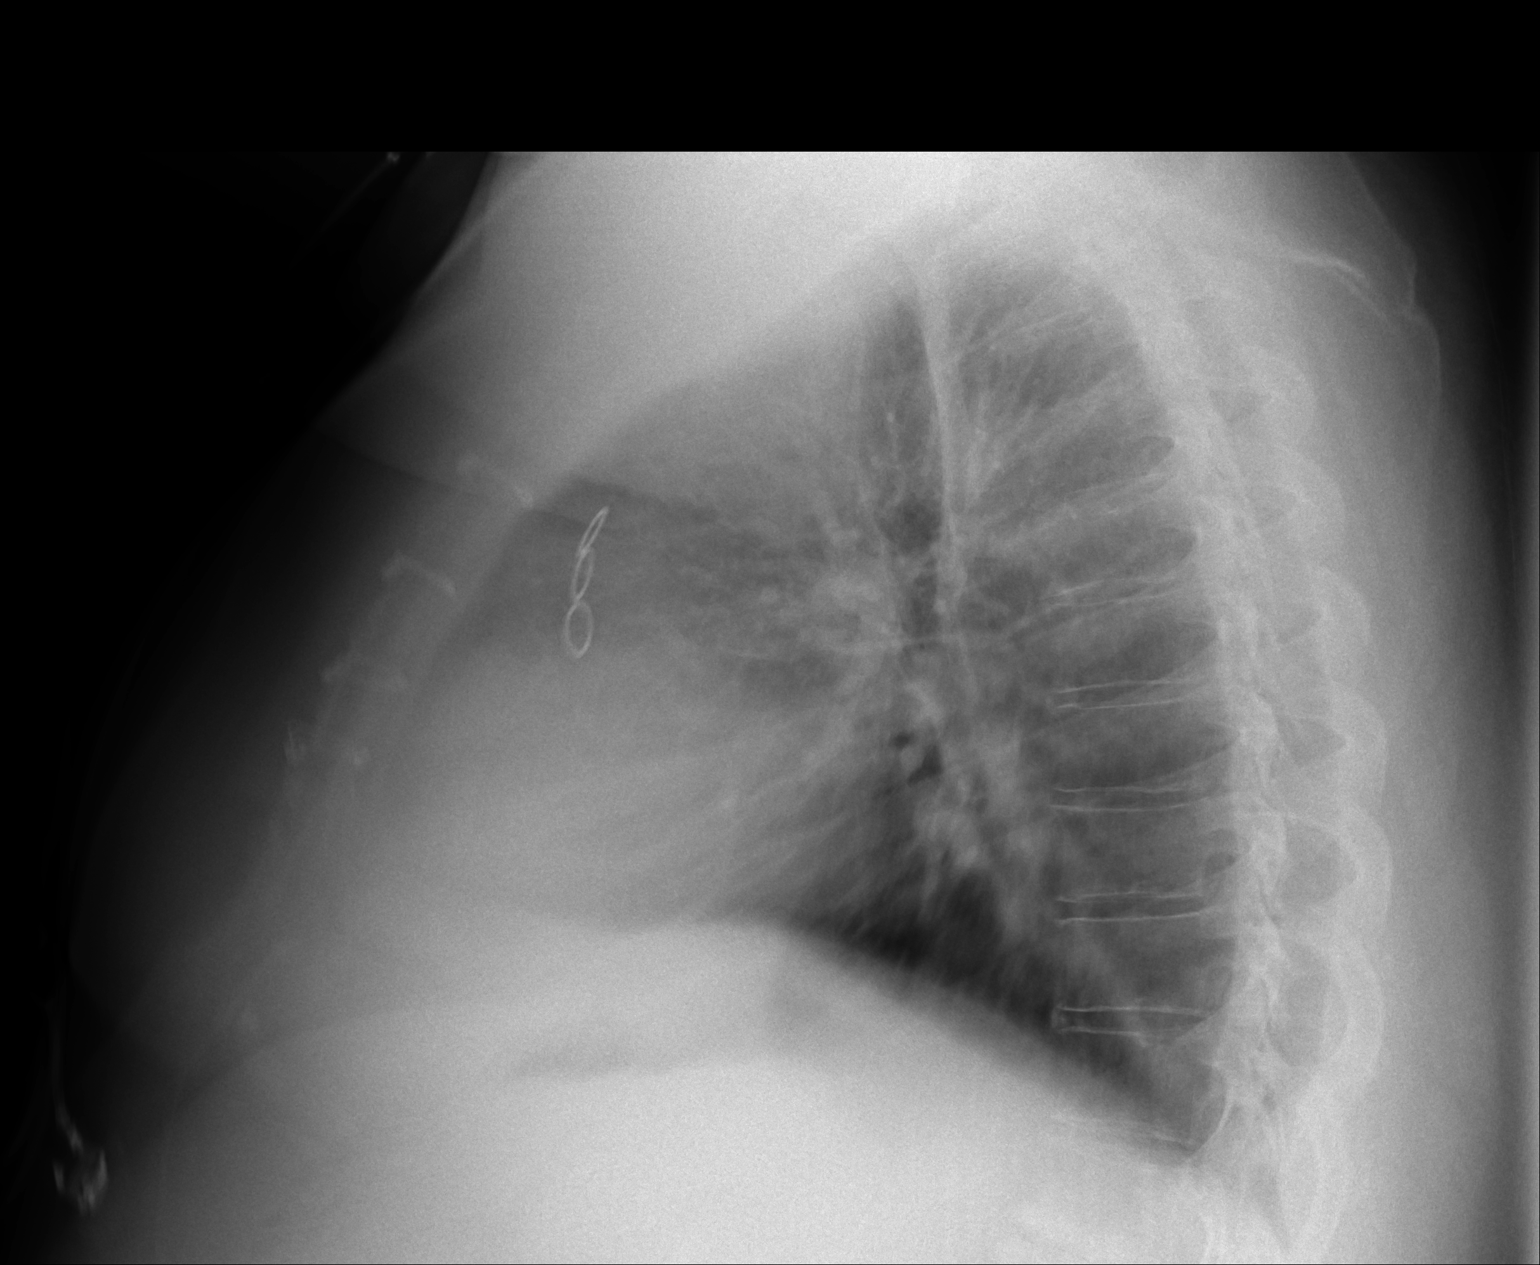

[2 of 2 positions shown; findings below may reference images not displayed]

PROCEDURE:     DXR - DXR CHEST PA (OR AP) AND LATERAL  - August 23, 2012  [DATE]

RESULT:     Comparison is made to the previous exam of 22 August, 2012. CABG
changes are present. The cardiac silhouette is borderline enlarged. The
lungs are clear. There is no effusion or pneumothorax. The bony structures
are unremarkable.
IMPRESSION: 1. CABG changes. No acute cardiopulmonary disease.

[REDACTED]

## 2014-05-03 ENCOUNTER — Ambulatory Visit: Payer: Self-pay | Admitting: Unknown Physician Specialty

## 2014-05-12 IMAGING — CT CT ABD-PELV W/ CM
1 of 3 series · 12 of 32 positions shown, 18 images · non-contrast
Comparison: none

REASON FOR EXAM: metformin LUQ pain LLQ pain
COMMENTS:

[Series 2: abd 3mm w 3.0 i40f 3 · axial · 0.84mm/px · z∈[+801,+1188]mm · 12 of 153 slices shown, 18 images]
[im 12/153  soft-tissue]
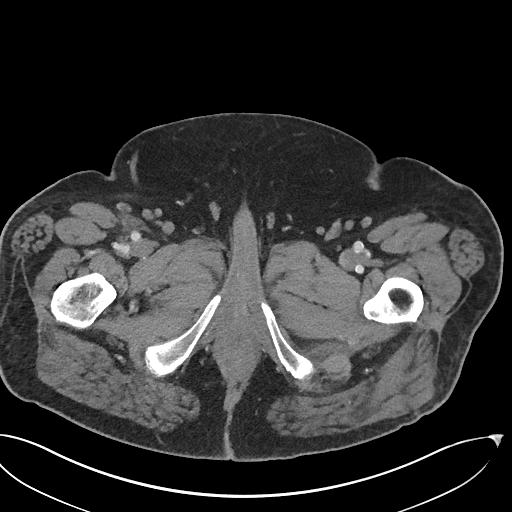
[im 12/153  bone]
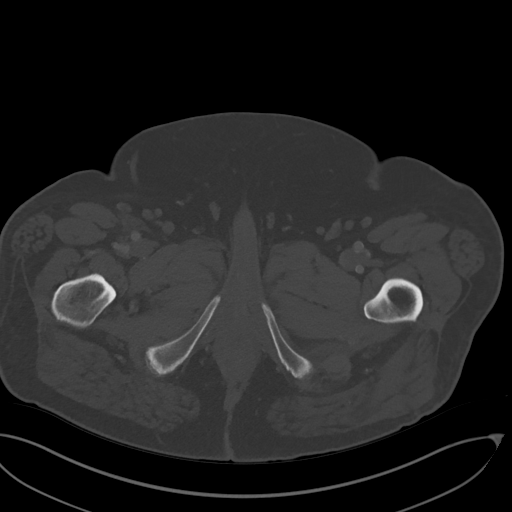
[im 24/153  soft-tissue]
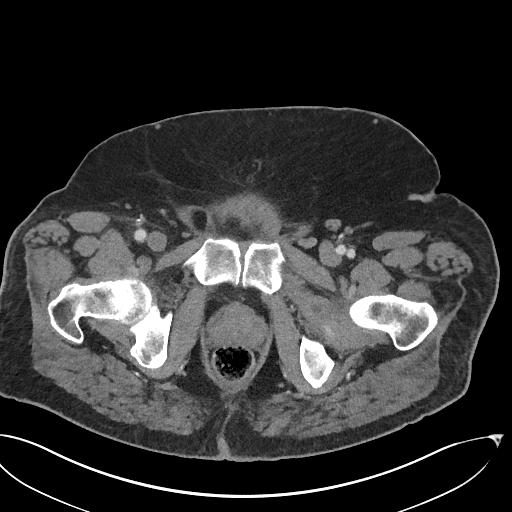
[im 36/153  soft-tissue]
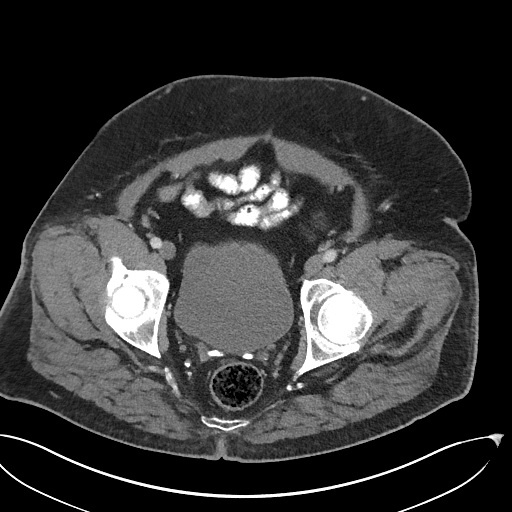
[im 47/153  soft-tissue]
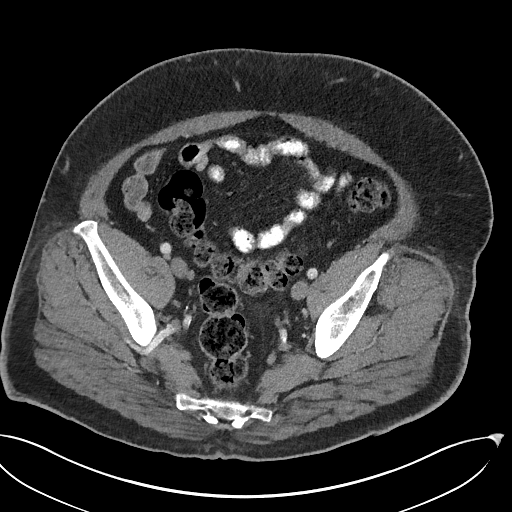
[im 59/153  soft-tissue]
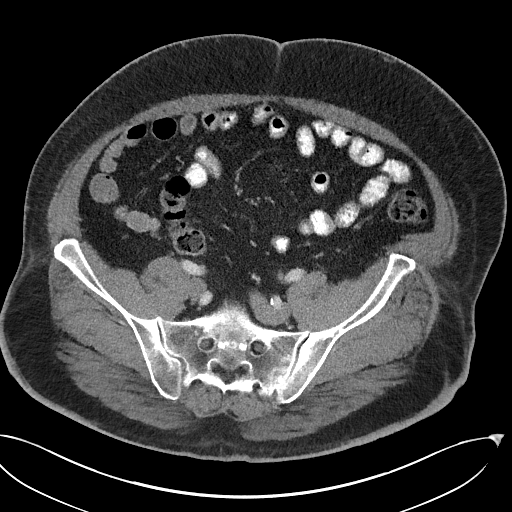
[im 71/153  soft-tissue]
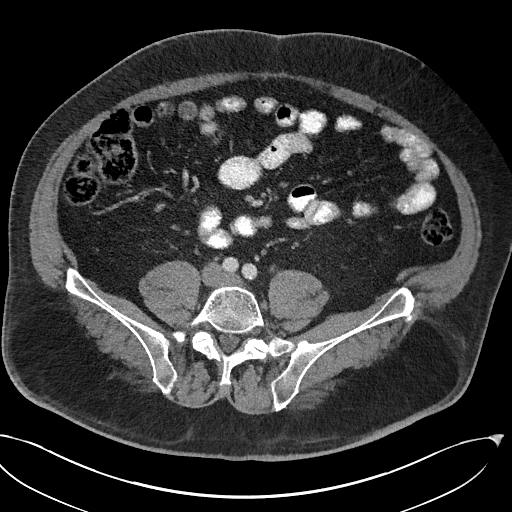
[im 82/153  soft-tissue]
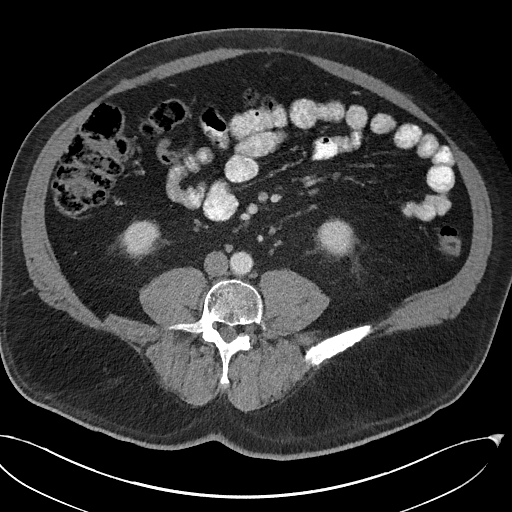
[im 94/153  soft-tissue]
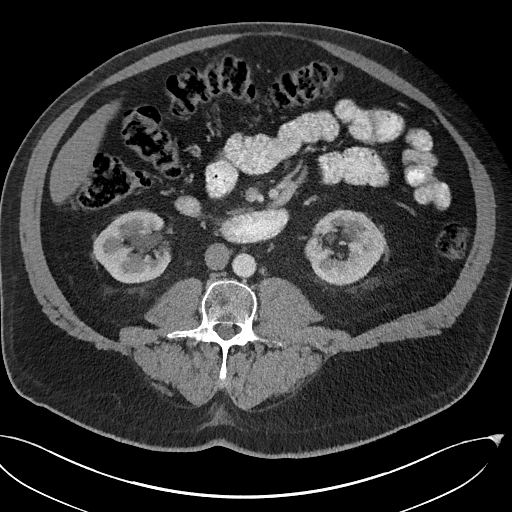
[im 106/153  soft-tissue]
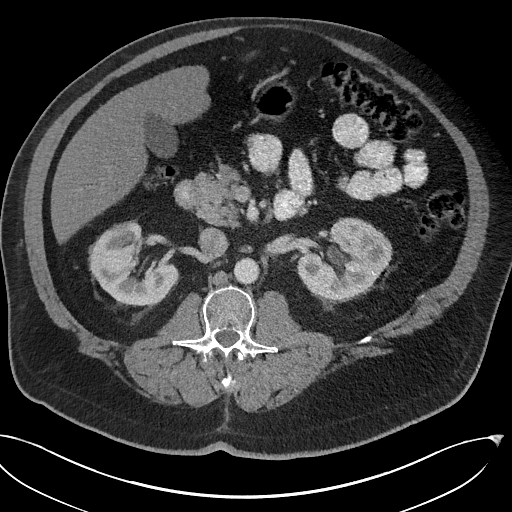
[im 106/153  lung]
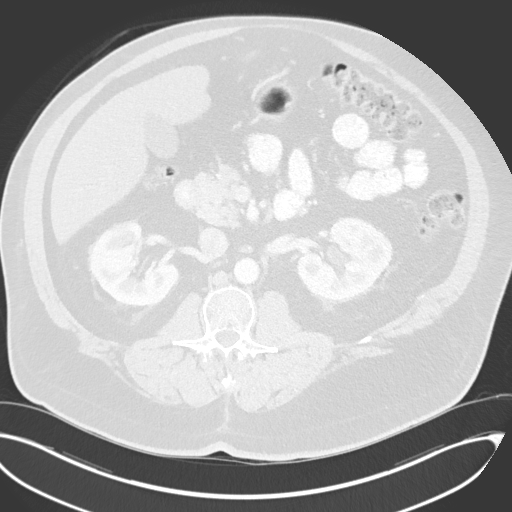
[im 106/153  bone]
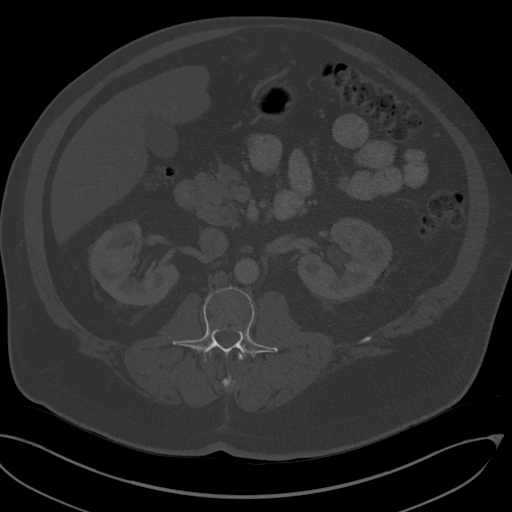
[im 117/153  soft-tissue]
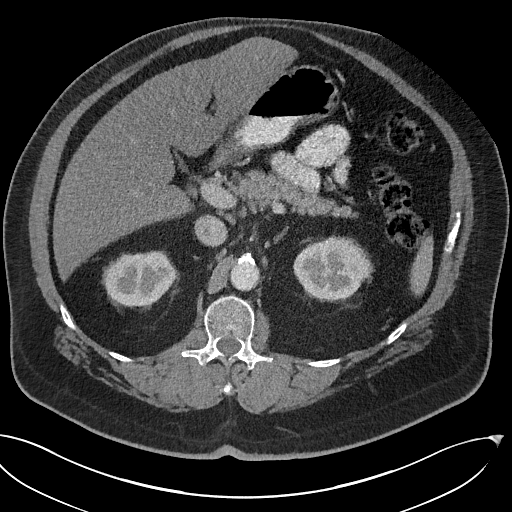
[im 117/153  lung]
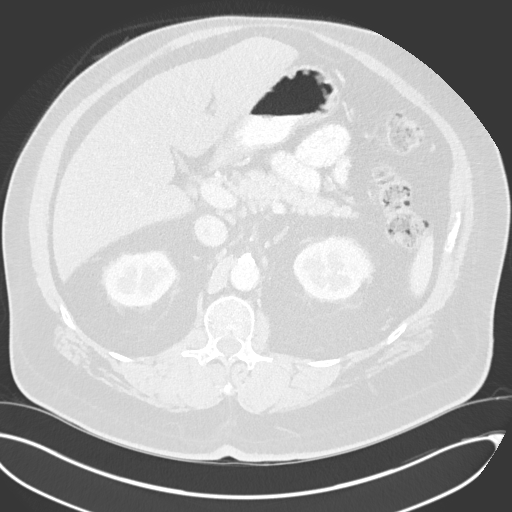
[im 129/153  soft-tissue]
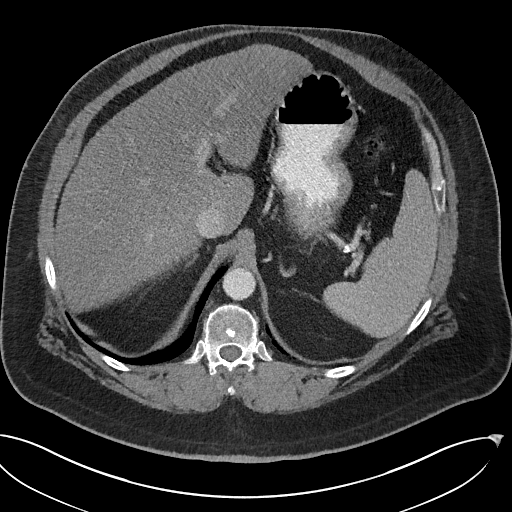
[im 129/153  lung]
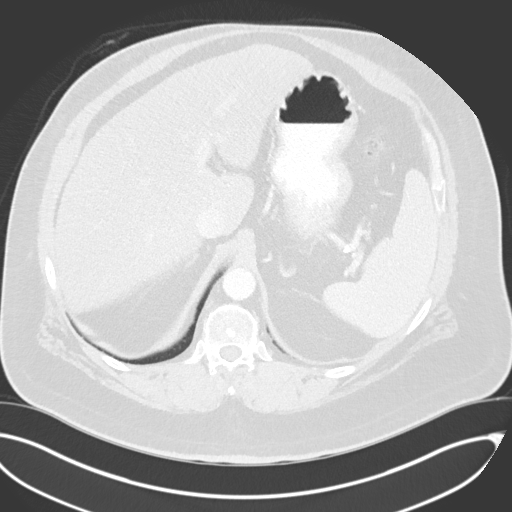
[im 141/153  soft-tissue]
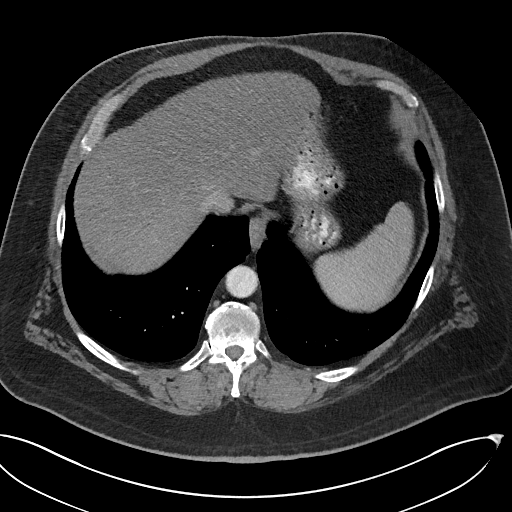
[im 141/153  lung]
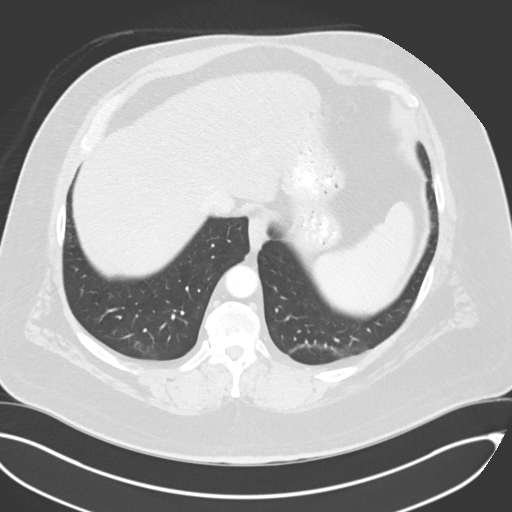

[12 of 32 positions shown; findings below may reference images not displayed]

PROCEDURE:     KCT - KCT ABDOMEN/PELVIS W  - September 14, 2012  [DATE]

RESULT:     Axial CT scanning was performed through the abdomen and pelvis
with reconstructions at 3 mm intervals and slice thicknesses. Review of
multiplanar reconstructed images was performed separately on the VIA
monitor. The patient received 100 cc of [id number] intravenously and also
received oral contrast material.

The patient's size limits the study in that the area of interest is closely
applied to the gantry. A marker was placed over the left anterior lateral
mid abdomen. Deep to this as best as can be determined there is no evidence
of a hernia or of a soft tissue mass.

The spleen is normal in density and size. The splenic flexure and the
stomach which lie deep to the area marked appear normal.

The liver exhibits no focal mass or ductal dilation. The gallbladder is
adequately distended with no evidence of stones or wall thickening or
pericholecystic fluid. The pancreas, adrenal glands, and kidneys are normal
in appearance. The caliber of the abdominal aorta is normal. There is no
periaortic or pericaval lymphadenopathy. The partially contrast-filled loops
of small bowel are normal in appearance. The large bowel exhibits a normal
stool and gas pattern. The urinary bladder is partially distended and
grossly normal. There are are calcifications with within the seminal
vesicles. There is no inguinal hernia. No pelvic or inguinal lymphadenopathy
is demonstrated.

The lung bases exhibit no acute abnormalities. The lumbar vertebral bodies
are preserved in height.
IMPRESSION: 1. Deep to the area marked in the left mid to upper abdomen anteriorly no
subcutaneous abnormality is demonstrated.
2. There is no abnormality of the spleen or liver or gallbladder or pancreas.
3. The stomach small and large bowel exhibit no acute abnormality.
Evaluation of the colon is limited due to the fact that the oral contrast
has not reached it.
4. There is no intra-abdominal or pelvic lymphadenopathy or abscess or free
fluid.

[REDACTED]

## 2014-05-23 ENCOUNTER — Ambulatory Visit: Payer: Self-pay | Admitting: Physical Medicine and Rehabilitation

## 2014-06-30 ENCOUNTER — Inpatient Hospital Stay: Payer: Self-pay | Admitting: Internal Medicine

## 2014-06-30 LAB — CBC
HCT: 41.4 % (ref 40.0–52.0)
HGB: 13.8 g/dL (ref 13.0–18.0)
MCH: 28.8 pg (ref 26.0–34.0)
MCHC: 33.3 g/dL (ref 32.0–36.0)
MCV: 86 fL (ref 80–100)
PLATELETS: 143 10*3/uL — AB (ref 150–440)
RBC: 4.79 10*6/uL (ref 4.40–5.90)
RDW: 14.1 % (ref 11.5–14.5)
WBC: 9 10*3/uL (ref 3.8–10.6)

## 2014-06-30 LAB — BASIC METABOLIC PANEL
ANION GAP: 8 (ref 7–16)
BUN: 16 mg/dL (ref 7–18)
Calcium, Total: 9.3 mg/dL (ref 8.5–10.1)
Chloride: 102 mmol/L (ref 98–107)
Co2: 27 mmol/L (ref 21–32)
Creatinine: 0.92 mg/dL (ref 0.60–1.30)
EGFR (African American): >60
EGFR (Non-African Amer.): >60
GLUCOSE: 240 mg/dL — AB (ref 65–99)
Osmolality: 283 (ref 275–301)
Potassium: 4 mmol/L (ref 3.5–5.1)
Sodium: 137 mmol/L (ref 136–145)

## 2014-06-30 LAB — PROTIME-INR
INR: 0.9
PROTHROMBIN TIME: 11.9 s (ref 11.5–14.7)

## 2014-06-30 LAB — PRO B NATRIURETIC PEPTIDE: B-Type Natriuretic Peptide: 265 pg/mL — ABNORMAL HIGH (ref 0–125)

## 2014-06-30 LAB — TROPONIN I: Troponin-I: 0.03 ng/mL

## 2014-07-01 LAB — BASIC METABOLIC PANEL
Anion Gap: 10 (ref 7–16)
BUN: 18 mg/dL (ref 7–18)
CALCIUM: 8.5 mg/dL (ref 8.5–10.1)
CHLORIDE: 102 mmol/L (ref 98–107)
Co2: 22 mmol/L (ref 21–32)
Creatinine: 1.09 mg/dL (ref 0.60–1.30)
EGFR (African American): >60
GLUCOSE: 450 mg/dL — AB (ref 65–99)
Osmolality: 290 (ref 275–301)
POTASSIUM: 4.7 mmol/L (ref 3.5–5.1)
SODIUM: 134 mmol/L — AB (ref 136–145)

## 2014-07-01 LAB — LIPID PANEL
Cholesterol: 152 mg/dL (ref 0–200)
HDL: 32 mg/dL — AB (ref 40–60)
Ldl Cholesterol, Calc: 84 mg/dL (ref 0–100)
Triglycerides: 178 mg/dL (ref 0–200)
VLDL Cholesterol, Calc: 36 mg/dL (ref 5–40)

## 2014-07-01 LAB — CBC WITH DIFFERENTIAL/PLATELET
BASOS ABS: 0.1 10*3/uL (ref 0.0–0.1)
Basophil %: 0.8 %
EOS PCT: 0.6 %
Eosinophil #: 0.1 10*3/uL (ref 0.0–0.7)
HCT: 41.6 % (ref 40.0–52.0)
HGB: 13.7 g/dL (ref 13.0–18.0)
LYMPHS ABS: 0.7 10*3/uL — AB (ref 1.0–3.6)
Lymphocyte %: 9.5 %
MCH: 28.7 pg (ref 26.0–34.0)
MCHC: 32.9 g/dL (ref 32.0–36.0)
MCV: 87 fL (ref 80–100)
MONO ABS: 0.1 x10 3/mm — AB (ref 0.2–1.0)
MONOS PCT: 1.2 %
NEUTROS PCT: 87.9 %
Neutrophil #: 6.9 10*3/uL — ABNORMAL HIGH (ref 1.4–6.5)
PLATELETS: 140 10*3/uL — AB (ref 150–440)
RBC: 4.77 10*6/uL (ref 4.40–5.90)
RDW: 14.3 % (ref 11.5–14.5)
WBC: 7.8 10*3/uL (ref 3.8–10.6)

## 2014-07-01 LAB — TSH: Thyroid Stimulating Horm: 1.77 u[IU]/mL

## 2014-07-01 LAB — HEPARIN LEVEL (UNFRACTIONATED): Anti-Xa(Unfractionated): 0.44 IU/mL (ref 0.30–0.70)

## 2014-07-01 LAB — TROPONIN I
Troponin-I: 0.12 ng/mL — ABNORMAL HIGH
Troponin-I: 0.16 ng/mL — ABNORMAL HIGH

## 2014-07-01 LAB — CK-MB
CK-MB: 6.6 ng/mL — AB (ref 0.5–3.6)
CK-MB: 6.3 ng/mL — ABNORMAL HIGH (ref 0.5–3.6)
CK-MB: 6.5 ng/mL — AB (ref 0.5–3.6)

## 2014-07-01 LAB — APTT: Activated PTT: 31.2 secs (ref 23.6–35.9)

## 2014-07-02 LAB — PLATELET COUNT: Platelet: 163 10*3/uL (ref 150–440)

## 2014-07-02 LAB — HEPARIN LEVEL (UNFRACTIONATED): Anti-Xa(Unfractionated): 0.45 IU/mL (ref 0.30–0.70)

## 2014-07-02 LAB — CK-MB
CK-MB: 7.1 ng/mL — ABNORMAL HIGH (ref 0.5–3.6)
CK-MB: 7.3 ng/mL — ABNORMAL HIGH (ref 0.5–3.6)
CK-MB: 6.8 ng/mL — ABNORMAL HIGH (ref 0.5–3.6)

## 2014-07-02 LAB — TROPONIN I
TROPONIN-I: 1.8 ng/mL — AB
Troponin-I: 1.7 ng/mL — ABNORMAL HIGH

## 2014-07-02 LAB — HEMOGLOBIN: HGB: 13.2 g/dL (ref 13.0–18.0)

## 2014-07-03 LAB — BASIC METABOLIC PANEL
Anion Gap: 9 (ref 7–16)
BUN: 24 mg/dL — ABNORMAL HIGH (ref 7–18)
Calcium, Total: 7.7 mg/dL — ABNORMAL LOW (ref 8.5–10.1)
Chloride: 105 mmol/L (ref 98–107)
Co2: 26 mmol/L (ref 21–32)
Creatinine: 1.13 mg/dL (ref 0.60–1.30)
EGFR (African American): >60
EGFR (Non-African Amer.): >60
Glucose: 85 mg/dL (ref 65–99)
Osmolality: 283 (ref 275–301)
Potassium: 3.7 mmol/L (ref 3.5–5.1)
Sodium: 140 mmol/L (ref 136–145)

## 2014-07-03 LAB — CBC WITH DIFFERENTIAL/PLATELET
Basophil #: 0.1 10*3/uL (ref 0.0–0.1)
Basophil %: 1.2 %
Eosinophil #: 0.6 10*3/uL (ref 0.0–0.7)
Eosinophil %: 6.9 %
HCT: 41.2 % (ref 40.0–52.0)
HGB: 13.7 g/dL (ref 13.0–18.0)
Lymphocyte #: 2.9 10*3/uL (ref 1.0–3.6)
Lymphocyte %: 32.3 %
MCH: 28.9 pg (ref 26.0–34.0)
MCHC: 33.3 g/dL (ref 32.0–36.0)
MCV: 87 fL (ref 80–100)
Monocyte #: 0.9 x10 3/mm (ref 0.2–1.0)
Monocyte %: 9.6 %
Neutrophil #: 4.5 10*3/uL (ref 1.4–6.5)
Neutrophil %: 50 %
Platelet: 163 10*3/uL (ref 150–440)
RBC: 4.74 10*6/uL (ref 4.40–5.90)
RDW: 14.3 % (ref 11.5–14.5)
WBC: 8.9 10*3/uL (ref 3.8–10.6)

## 2014-07-03 LAB — HEPARIN LEVEL (UNFRACTIONATED): Anti-Xa(Unfractionated): 0.43 IU/mL (ref 0.30–0.70)

## 2014-07-03 LAB — TROPONIN I: Troponin-I: 1.1 ng/mL — ABNORMAL HIGH

## 2014-07-04 LAB — CBC WITH DIFFERENTIAL/PLATELET
Basophil #: 0.1 10*3/uL (ref 0.0–0.1)
Basophil %: 1.2 %
EOS PCT: 7.7 %
Eosinophil #: 0.7 10*3/uL (ref 0.0–0.7)
HCT: 44.2 % (ref 40.0–52.0)
HGB: 14.6 g/dL (ref 13.0–18.0)
Lymphocyte #: 2.2 10*3/uL (ref 1.0–3.6)
Lymphocyte %: 26 %
MCH: 28.7 pg (ref 26.0–34.0)
MCHC: 33 g/dL (ref 32.0–36.0)
MCV: 87 fL (ref 80–100)
MONOS PCT: 9.7 %
Monocyte #: 0.8 x10 3/mm (ref 0.2–1.0)
Neutrophil #: 4.7 10*3/uL (ref 1.4–6.5)
Neutrophil %: 55.4 %
Platelet: 171 10*3/uL (ref 150–440)
RBC: 5.07 10*6/uL (ref 4.40–5.90)
RDW: 14.4 % (ref 11.5–14.5)
WBC: 8.4 10*3/uL (ref 3.8–10.6)

## 2014-07-04 LAB — BASIC METABOLIC PANEL
Anion Gap: 6 — ABNORMAL LOW (ref 7–16)
BUN: 19 mg/dL — ABNORMAL HIGH (ref 7–18)
CALCIUM: 8.9 mg/dL (ref 8.5–10.1)
CHLORIDE: 107 mmol/L (ref 98–107)
CO2: 28 mmol/L (ref 21–32)
CREATININE: 1.03 mg/dL (ref 0.60–1.30)
EGFR (African American): >60
Glucose: 61 mg/dL — ABNORMAL LOW (ref 65–99)
OSMOLALITY: 281 (ref 275–301)
Potassium: 3.9 mmol/L (ref 3.5–5.1)
Sodium: 141 mmol/L (ref 136–145)

## 2014-07-04 LAB — MAGNESIUM: Magnesium: 2.1 mg/dL

## 2014-07-19 ENCOUNTER — Ambulatory Visit: Payer: Self-pay

## 2014-07-19 ENCOUNTER — Inpatient Hospital Stay: Payer: Self-pay | Admitting: Internal Medicine

## 2014-07-19 LAB — PROTIME-INR
INR: 0.9
Prothrombin Time: 12.8 secs

## 2014-07-19 LAB — BASIC METABOLIC PANEL
Anion Gap: 8 (ref 7–16)
BUN: 23 mg/dL — ABNORMAL HIGH (ref 7–18)
Calcium, Total: 9.1 mg/dL (ref 8.5–10.1)
Chloride: 103 mmol/L (ref 98–107)
Co2: 29 mmol/L (ref 21–32)
Creatinine: 1.04 mg/dL (ref 0.60–1.30)
GLUCOSE: 171 mg/dL — AB (ref 65–99)
Osmolality: 287 (ref 275–301)
Potassium: 3.6 mmol/L (ref 3.5–5.1)
SODIUM: 140 mmol/L (ref 136–145)

## 2014-07-19 LAB — CBC
HCT: 40.7 % (ref 40.0–52.0)
HGB: 13.2 g/dL (ref 13.0–18.0)
MCH: 27.7 pg (ref 26.0–34.0)
MCHC: 32.4 g/dL (ref 32.0–36.0)
MCV: 86 fL (ref 80–100)
Platelet: 204 10*3/uL (ref 150–440)
RBC: 4.76 10*6/uL (ref 4.40–5.90)
RDW: 14.1 % (ref 11.5–14.5)
WBC: 16.4 10*3/uL — ABNORMAL HIGH (ref 3.8–10.6)

## 2014-07-19 LAB — PRO B NATRIURETIC PEPTIDE: B-Type Natriuretic Peptide: 1456 pg/mL — ABNORMAL HIGH (ref 0–125)

## 2014-07-19 LAB — APTT: ACTIVATED PTT: 29.9 s (ref 23.6–35.9)

## 2014-07-19 LAB — TROPONIN I: TROPONIN-I: 0.66 ng/mL — AB

## 2014-07-19 LAB — CK-MB: CK-MB: 7.3 ng/mL — ABNORMAL HIGH (ref 0.5–3.6)

## 2014-07-20 LAB — BASIC METABOLIC PANEL
Anion Gap: 10 (ref 7–16)
BUN: 26 mg/dL — AB (ref 7–18)
CALCIUM: 8.3 mg/dL — AB (ref 8.5–10.1)
Chloride: 100 mmol/L (ref 98–107)
Co2: 25 mmol/L (ref 21–32)
Creatinine: 1.4 mg/dL — ABNORMAL HIGH (ref 0.60–1.30)
EGFR (African American): >60
EGFR (Non-African Amer.): 55 — ABNORMAL LOW
Glucose: 163 mg/dL — ABNORMAL HIGH (ref 65–99)
Osmolality: 278 (ref 275–301)
Potassium: 3.7 mmol/L (ref 3.5–5.1)
Sodium: 135 mmol/L — ABNORMAL LOW (ref 136–145)

## 2014-07-20 LAB — LIPID PANEL
Cholesterol: 104 mg/dL (ref 0–200)
HDL: 43 mg/dL (ref 40–60)
LDL CHOLESTEROL, CALC: 35 mg/dL (ref 0–100)
TRIGLYCERIDES: 131 mg/dL (ref 0–200)
VLDL Cholesterol, Calc: 26 mg/dL (ref 5–40)

## 2014-07-20 LAB — CBC WITH DIFFERENTIAL/PLATELET
BASOS PCT: 0.6 %
Basophil #: 0.1 10*3/uL (ref 0.0–0.1)
EOS PCT: 0.8 %
Eosinophil #: 0.2 10*3/uL (ref 0.0–0.7)
HCT: 35.7 % — AB (ref 40.0–52.0)
HGB: 11.6 g/dL — AB (ref 13.0–18.0)
LYMPHS ABS: 0.9 10*3/uL — AB (ref 1.0–3.6)
Lymphocyte %: 4.1 %
MCH: 27.8 pg (ref 26.0–34.0)
MCHC: 32.5 g/dL (ref 32.0–36.0)
MCV: 85 fL (ref 80–100)
MONO ABS: 1.4 x10 3/mm — AB (ref 0.2–1.0)
Monocyte %: 6.2 %
Neutrophil #: 19.4 10*3/uL — ABNORMAL HIGH (ref 1.4–6.5)
Neutrophil %: 88.3 %
Platelet: 162 10*3/uL (ref 150–440)
RBC: 4.18 10*6/uL — ABNORMAL LOW (ref 4.40–5.90)
RDW: 14.4 % (ref 11.5–14.5)
WBC: 22 10*3/uL — AB (ref 3.8–10.6)

## 2014-07-20 LAB — CK-MB
CK-MB: 10.8 ng/mL — ABNORMAL HIGH (ref 0.5–3.6)
CK-MB: 8.3 ng/mL — AB (ref 0.5–3.6)

## 2014-07-20 LAB — TROPONIN I
TROPONIN-I: 17 ng/mL — AB
Troponin-I: 17 ng/mL — ABNORMAL HIGH

## 2014-07-20 LAB — VANCOMYCIN, TROUGH: Vancomycin, Trough: 14 ug/mL (ref 10–20)

## 2014-07-21 LAB — CBC WITH DIFFERENTIAL/PLATELET
BASOS PCT: 0.6 %
Basophil #: 0.1 10*3/uL (ref 0.0–0.1)
EOS ABS: 0.3 10*3/uL (ref 0.0–0.7)
Eosinophil %: 3 %
HCT: 35.4 % — ABNORMAL LOW (ref 40.0–52.0)
HGB: 11.5 g/dL — ABNORMAL LOW (ref 13.0–18.0)
Lymphocyte #: 1 10*3/uL (ref 1.0–3.6)
Lymphocyte %: 8.7 %
MCH: 28.1 pg (ref 26.0–34.0)
MCHC: 32.6 g/dL (ref 32.0–36.0)
MCV: 86 fL (ref 80–100)
Monocyte #: 0.7 x10 3/mm (ref 0.2–1.0)
Monocyte %: 6.2 %
NEUTROS ABS: 9 10*3/uL — AB (ref 1.4–6.5)
Neutrophil %: 81.5 %
Platelet: 142 10*3/uL — ABNORMAL LOW (ref 150–440)
RBC: 4.11 10*6/uL — ABNORMAL LOW (ref 4.40–5.90)
RDW: 14.4 % (ref 11.5–14.5)
WBC: 11.1 10*3/uL — ABNORMAL HIGH (ref 3.8–10.6)

## 2014-07-21 LAB — BASIC METABOLIC PANEL
Anion Gap: 9 (ref 7–16)
BUN: 18 mg/dL (ref 7–18)
CALCIUM: 8.2 mg/dL — AB (ref 8.5–10.1)
CHLORIDE: 104 mmol/L (ref 98–107)
CO2: 25 mmol/L (ref 21–32)
Creatinine: 1.12 mg/dL (ref 0.60–1.30)
EGFR (African American): >60
Glucose: 177 mg/dL — ABNORMAL HIGH (ref 65–99)
Osmolality: 282 (ref 275–301)
POTASSIUM: 3.8 mmol/L (ref 3.5–5.1)
SODIUM: 138 mmol/L (ref 136–145)

## 2014-07-21 LAB — URINALYSIS, COMPLETE
Bacteria: NONE SEEN
Bilirubin,UR: NEGATIVE
Glucose,UR: >=500 mg/dL (ref 0–75)
Leukocyte Esterase: NEGATIVE
NITRITE: NEGATIVE
Ph: 7 (ref 4.5–8.0)
Protein: 30
Specific Gravity: 1.011 (ref 1.003–1.030)
Squamous Epithelial: NONE SEEN

## 2014-07-22 LAB — CBC WITH DIFFERENTIAL/PLATELET
Basophil #: 0 10*3/uL (ref 0.0–0.1)
Basophil %: 0.4 %
EOS PCT: 1.6 %
Eosinophil #: 0.2 10*3/uL (ref 0.0–0.7)
HCT: 34.7 % — ABNORMAL LOW (ref 40.0–52.0)
HGB: 11.3 g/dL — ABNORMAL LOW (ref 13.0–18.0)
LYMPHS ABS: 1 10*3/uL (ref 1.0–3.6)
Lymphocyte %: 9.9 %
MCH: 27.8 pg (ref 26.0–34.0)
MCHC: 32.6 g/dL (ref 32.0–36.0)
MCV: 85 fL (ref 80–100)
Monocyte #: 0.9 x10 3/mm (ref 0.2–1.0)
Monocyte %: 9.3 %
NEUTROS ABS: 7.6 10*3/uL — AB (ref 1.4–6.5)
Neutrophil %: 78.8 %
Platelet: 138 10*3/uL — ABNORMAL LOW (ref 150–440)
RBC: 4.07 10*6/uL — AB (ref 4.40–5.90)
RDW: 14.6 % — AB (ref 11.5–14.5)
WBC: 9.7 10*3/uL (ref 3.8–10.6)

## 2014-07-22 LAB — CULTURE, BLOOD (SINGLE)

## 2014-07-22 LAB — COMPREHENSIVE METABOLIC PANEL
ALK PHOS: 65 U/L (ref 46–116)
Albumin: 2.8 g/dL — ABNORMAL LOW (ref 3.4–5.0)
Anion Gap: 8 (ref 7–16)
BUN: 15 mg/dL (ref 7–18)
Bilirubin,Total: 1.3 mg/dL — ABNORMAL HIGH (ref 0.2–1.0)
CALCIUM: 8.2 mg/dL — AB (ref 8.5–10.1)
CO2: 25 mmol/L (ref 21–32)
Chloride: 101 mmol/L (ref 98–107)
Creatinine: 1.08 mg/dL (ref 0.60–1.30)
EGFR (African American): >60
Glucose: 252 mg/dL — ABNORMAL HIGH (ref 65–99)
Osmolality: 278 (ref 275–301)
Potassium: 3.9 mmol/L (ref 3.5–5.1)
SGOT(AST): 41 U/L — ABNORMAL HIGH (ref 15–37)
SGPT (ALT): 46 U/L (ref 14–63)
Sodium: 134 mmol/L — ABNORMAL LOW (ref 136–145)
Total Protein: 7 g/dL (ref 6.4–8.2)

## 2014-07-23 LAB — COMPREHENSIVE METABOLIC PANEL
ALT: 44 U/L (ref 14–63)
AST: 33 U/L (ref 15–37)
Albumin: 2.7 g/dL — ABNORMAL LOW (ref 3.4–5.0)
Alkaline Phosphatase: 63 U/L (ref 46–116)
Anion Gap: 8 (ref 7–16)
BUN: 18 mg/dL (ref 7–18)
Bilirubin,Total: 0.9 mg/dL (ref 0.2–1.0)
CALCIUM: 8.2 mg/dL — AB (ref 8.5–10.1)
CO2: 25 mmol/L (ref 21–32)
Chloride: 103 mmol/L (ref 98–107)
Creatinine: 1.13 mg/dL (ref 0.60–1.30)
Glucose: 203 mg/dL — ABNORMAL HIGH (ref 65–99)
Osmolality: 280 (ref 275–301)
POTASSIUM: 4.1 mmol/L (ref 3.5–5.1)
SODIUM: 136 mmol/L (ref 136–145)
TOTAL PROTEIN: 7.1 g/dL (ref 6.4–8.2)

## 2014-07-23 LAB — CBC WITH DIFFERENTIAL/PLATELET
BASOS ABS: 0.1 10*3/uL (ref 0.0–0.1)
BASOS PCT: 0.9 %
Eosinophil #: 0.1 10*3/uL (ref 0.0–0.7)
Eosinophil %: 1.5 %
HCT: 32.6 % — ABNORMAL LOW (ref 40.0–52.0)
HGB: 10.7 g/dL — AB (ref 13.0–18.0)
LYMPHS PCT: 13.8 %
Lymphocyte #: 1.1 10*3/uL (ref 1.0–3.6)
MCH: 27.8 pg (ref 26.0–34.0)
MCHC: 32.8 g/dL (ref 32.0–36.0)
MCV: 85 fL (ref 80–100)
Monocyte #: 0.7 x10 3/mm (ref 0.2–1.0)
Monocyte %: 8.7 %
NEUTROS PCT: 75.1 %
Neutrophil #: 5.9 10*3/uL (ref 1.4–6.5)
PLATELETS: 139 10*3/uL — AB (ref 150–440)
RBC: 3.85 10*6/uL — ABNORMAL LOW (ref 4.40–5.90)
RDW: 14.8 % — ABNORMAL HIGH (ref 11.5–14.5)
WBC: 7.8 10*3/uL (ref 3.8–10.6)

## 2014-07-23 LAB — MAGNESIUM: MAGNESIUM: 2.1 mg/dL

## 2014-07-24 LAB — CBC WITH DIFFERENTIAL/PLATELET
BASOS ABS: 0 10*3/uL (ref 0.0–0.1)
Basophil %: 0.2 %
Eosinophil #: 0 10*3/uL (ref 0.0–0.7)
Eosinophil %: 0 %
HCT: 33.2 % — ABNORMAL LOW (ref 40.0–52.0)
HGB: 11.1 g/dL — ABNORMAL LOW (ref 13.0–18.0)
LYMPHS ABS: 0.7 10*3/uL — AB (ref 1.0–3.6)
Lymphocyte %: 8.1 %
MCH: 28.3 pg (ref 26.0–34.0)
MCHC: 33.3 g/dL (ref 32.0–36.0)
MCV: 85 fL (ref 80–100)
MONOS PCT: 5.2 %
Monocyte #: 0.4 x10 3/mm (ref 0.2–1.0)
Neutrophil #: 7.1 10*3/uL — ABNORMAL HIGH (ref 1.4–6.5)
Neutrophil %: 86.5 %
PLATELETS: 134 10*3/uL — AB (ref 150–440)
RBC: 3.91 10*6/uL — AB (ref 4.40–5.90)
RDW: 14.9 % — AB (ref 11.5–14.5)
WBC: 8.2 10*3/uL (ref 3.8–10.6)

## 2014-07-24 LAB — CULTURE, BLOOD (SINGLE)

## 2014-07-24 LAB — BASIC METABOLIC PANEL
ANION GAP: 10 (ref 7–16)
BUN: 31 mg/dL — ABNORMAL HIGH (ref 7–18)
CALCIUM: 8.6 mg/dL (ref 8.5–10.1)
Chloride: 104 mmol/L (ref 98–107)
Co2: 22 mmol/L (ref 21–32)
Creatinine: 1.21 mg/dL (ref 0.60–1.30)
EGFR (African American): >60
EGFR (Non-African Amer.): >60
Glucose: 328 mg/dL — ABNORMAL HIGH (ref 65–99)
OSMOLALITY: 291 (ref 275–301)
POTASSIUM: 4.1 mmol/L (ref 3.5–5.1)
Sodium: 136 mmol/L (ref 136–145)

## 2014-07-28 ENCOUNTER — Emergency Department (HOSPITAL_COMMUNITY)
Admission: EM | Admit: 2014-07-28 | Discharge: 2014-07-28 | Disposition: A | Discharge status: Home / Self Care | Payer: BLUE CROSS/BLUE SHIELD | Attending: Emergency Medicine | Admitting: Emergency Medicine

## 2014-07-28 ENCOUNTER — Encounter (HOSPITAL_COMMUNITY): Payer: Self-pay | Admitting: Emergency Medicine

## 2014-07-28 ENCOUNTER — Emergency Department (HOSPITAL_COMMUNITY): Payer: BLUE CROSS/BLUE SHIELD

## 2014-07-28 DIAGNOSIS — Z8701 Personal history of pneumonia (recurrent): Secondary | ICD-10-CM | POA: Insufficient documentation

## 2014-07-28 DIAGNOSIS — R2243 Localized swelling, mass and lump, lower limb, bilateral: Secondary | ICD-10-CM | POA: Diagnosis not present

## 2014-07-28 DIAGNOSIS — Z951 Presence of aortocoronary bypass graft: Secondary | ICD-10-CM | POA: Insufficient documentation

## 2014-07-28 DIAGNOSIS — Z8619 Personal history of other infectious and parasitic diseases: Secondary | ICD-10-CM | POA: Diagnosis not present

## 2014-07-28 DIAGNOSIS — Z7982 Long term (current) use of aspirin: Secondary | ICD-10-CM | POA: Insufficient documentation

## 2014-07-28 DIAGNOSIS — I252 Old myocardial infarction: Secondary | ICD-10-CM | POA: Diagnosis not present

## 2014-07-28 DIAGNOSIS — Z792 Long term (current) use of antibiotics: Secondary | ICD-10-CM | POA: Diagnosis not present

## 2014-07-28 DIAGNOSIS — Z79899 Other long term (current) drug therapy: Secondary | ICD-10-CM | POA: Diagnosis not present

## 2014-07-28 DIAGNOSIS — R5383 Other fatigue: Secondary | ICD-10-CM | POA: Diagnosis present

## 2014-07-28 DIAGNOSIS — Z7901 Long term (current) use of anticoagulants: Secondary | ICD-10-CM | POA: Insufficient documentation

## 2014-07-28 DIAGNOSIS — Z7951 Long term (current) use of inhaled steroids: Secondary | ICD-10-CM | POA: Diagnosis not present

## 2014-07-28 DIAGNOSIS — R531 Weakness: Secondary | ICD-10-CM | POA: Diagnosis not present

## 2014-07-28 DIAGNOSIS — Z9889 Other specified postprocedural states: Secondary | ICD-10-CM | POA: Insufficient documentation

## 2014-07-28 DIAGNOSIS — Z794 Long term (current) use of insulin: Secondary | ICD-10-CM | POA: Diagnosis not present

## 2014-07-28 HISTORY — DX: Acute myocardial infarction, unspecified: I21.9

## 2014-07-28 HISTORY — DX: Sepsis, unspecified organism: A41.9

## 2014-07-28 HISTORY — DX: Other specified postprocedural states: Z98.890

## 2014-07-28 HISTORY — DX: Personal history of other medical treatment: Z92.89

## 2014-07-28 LAB — COMPREHENSIVE METABOLIC PANEL
ALBUMIN: 2.8 g/dL — AB (ref 3.5–5.2)
ALT: 59 U/L — ABNORMAL HIGH (ref 0–53)
ANION GAP: 10 (ref 5–15)
AST: 31 U/L (ref 0–37)
Alkaline Phosphatase: 65 U/L (ref 39–117)
BILIRUBIN TOTAL: 0.7 mg/dL (ref 0.3–1.2)
BUN: 17 mg/dL (ref 6–23)
CHLORIDE: 101 mmol/L (ref 96–112)
CO2: 29 mmol/L (ref 19–32)
CREATININE: 0.91 mg/dL (ref 0.50–1.35)
Calcium: 8.9 mg/dL (ref 8.4–10.5)
GFR calc Af Amer: >90 mL/min (ref >90)
GFR calc non Af Amer: >90 mL/min (ref >90)
Glucose, Bld: 127 mg/dL — ABNORMAL HIGH (ref 70–99)
POTASSIUM: 4.1 mmol/L (ref 3.5–5.1)
Sodium: 140 mmol/L (ref 135–145)
TOTAL PROTEIN: 6.6 g/dL (ref 6.0–8.3)

## 2014-07-28 LAB — URINALYSIS, ROUTINE W REFLEX MICROSCOPIC
BILIRUBIN URINE: NEGATIVE
Glucose, UA: NEGATIVE mg/dL
HGB URINE DIPSTICK: NEGATIVE
Ketones, ur: NEGATIVE mg/dL
Leukocytes, UA: NEGATIVE
NITRITE: NEGATIVE
PH: 5 (ref 5.0–8.0)
Protein, ur: NEGATIVE mg/dL
SPECIFIC GRAVITY, URINE: 1.01 (ref 1.005–1.030)
UROBILINOGEN UA: 0.2 mg/dL (ref 0.0–1.0)

## 2014-07-28 LAB — I-STAT ARTERIAL BLOOD GAS, ED
Acid-Base Excess: 5 mmol/L — ABNORMAL HIGH (ref 0.0–2.0)
BICARBONATE: 30.7 meq/L — AB (ref 20.0–24.0)
O2 Saturation: 96 %
TCO2: 32 mmol/L (ref 0–100)
pCO2 arterial: 49 mmHg — ABNORMAL HIGH (ref 35.0–45.0)
pH, Arterial: 7.405 (ref 7.350–7.450)
pO2, Arterial: 86 mmHg (ref 80.0–100.0)

## 2014-07-28 LAB — CBC WITH DIFFERENTIAL/PLATELET
Basophils Absolute: 0 10*3/uL (ref 0.0–0.1)
Basophils Relative: 0 % (ref 0–1)
Eosinophils Absolute: 0.5 10*3/uL (ref 0.0–0.7)
Eosinophils Relative: 6 % — ABNORMAL HIGH (ref 0–5)
HCT: 35.2 % — ABNORMAL LOW (ref 39.0–52.0)
HEMOGLOBIN: 11.4 g/dL — AB (ref 13.0–17.0)
LYMPHS ABS: 1 10*3/uL (ref 0.7–4.0)
LYMPHS PCT: 13 % (ref 12–46)
MCH: 27.7 pg (ref 26.0–34.0)
MCHC: 32.4 g/dL (ref 30.0–36.0)
MCV: 85.4 fL (ref 78.0–100.0)
MONOS PCT: 5 % (ref 3–12)
Monocytes Absolute: 0.4 10*3/uL (ref 0.1–1.0)
NEUTROS PCT: 76 % (ref 43–77)
Neutro Abs: 6 10*3/uL (ref 1.7–7.7)
PLATELETS: 214 10*3/uL (ref 150–400)
RBC: 4.12 MIL/uL — AB (ref 4.22–5.81)
RDW: 14.2 % (ref 11.5–15.5)
WBC: 8 10*3/uL (ref 4.0–10.5)

## 2014-07-28 LAB — I-STAT TROPONIN, ED: TROPONIN I, POC: 0.04 ng/mL (ref 0.00–0.08)

## 2014-07-28 LAB — PROTIME-INR
INR: 1.19 (ref 0.00–1.49)
PROTHROMBIN TIME: 15.2 s (ref 11.6–15.2)

## 2014-07-28 LAB — I-STAT CG4 LACTIC ACID, ED
LACTIC ACID, VENOUS: 0.58 mmol/L (ref 0.5–2.0)
LACTIC ACID, VENOUS: 0.55 mmol/L (ref 0.5–2.0)

## 2014-07-28 LAB — BRAIN NATRIURETIC PEPTIDE: B NATRIURETIC PEPTIDE 5: 292.9 pg/mL — AB (ref 0.0–100.0)

## 2014-07-28 NOTE — ED Notes (Signed)
Per [facility], pt had a recent hospital stay at [facility] for acute MI/sepsis. Pt states he was cardioverted. Pt discharged home two days ago, lives with wife. Pt has a home health nurse who went out to visit patient, called 911 due to patient having periods of nodding off. Pt lethargic, difficulty keeping eyes open. Pt AAOx4 upon arrival to ED. Denies CP, SOB, has no complaints at this time.

## 2014-07-28 NOTE — ED Notes (Signed)
Signed permit to obtain records from [facility].

## 2014-07-28 NOTE — Discharge Instructions (Signed)
Stop taking ativan. Stop Tussionex (liquid cough medication). Recheck here, or with Primary care physician with any worsening symptoms.  Fatigue Fatigue is a feeling of tiredness, lack of energy, lack of motivation, or feeling tired all the time. Having enough rest, good nutrition, and reducing stress will normally reduce fatigue. Consult your caregiver if it persists. The nature of your fatigue will help your caregiver to find out its cause. The treatment is based on the cause.  CAUSES  There are many causes for fatigue. Most of the time, fatigue can be traced to one or more of your habits or routines. Most causes fit into one or more of three general areas. They are: Lifestyle problems  Sleep disturbances.  Overwork.  Physical exertion.  Unhealthy habits.  Poor eating habits or eating disorders.  Alcohol and/or drug use .  Lack of proper nutrition (malnutrition). Psychological problems  Stress and/or anxiety problems.  Depression.  Grief.  Boredom. Medical Problems or Conditions  Anemia.  Pregnancy.  Thyroid gland problems.  Recovery from major surgery.  Continuous pain.  Emphysema or asthma that is not well controlled  Allergic conditions.  Diabetes.  Infections (such as mononucleosis).  Obesity.  Sleep disorders, such as sleep apnea.  Heart failure or other heart-related problems.  Cancer.  Kidney disease.  Liver disease.  Effects of certain medicines such as antihistamines, cough and cold remedies, prescription pain medicines, heart and blood pressure medicines, drugs used for treatment of cancer, and some antidepressants. SYMPTOMS  The symptoms of fatigue include:   Lack of energy.  Lack of drive (motivation).  Drowsiness.  Feeling of indifference to the surroundings. DIAGNOSIS  The details of how you feel help guide your caregiver in finding out what is causing the fatigue. You will be asked about your present and past health  condition. It is important to review all medicines that you take, including prescription and non-prescription items. A thorough exam will be done. You will be questioned about your feelings, habits, and normal lifestyle. Your caregiver may suggest blood tests, urine tests, or other tests to look for common medical causes of fatigue.  TREATMENT  Fatigue is treated by correcting the underlying cause. For example, if you have continuous pain or depression, treating these causes will improve how you feel. Similarly, adjusting the dose of certain medicines will help in reducing fatigue.  HOME CARE INSTRUCTIONS   Try to get the required amount of good sleep every night.  Eat a healthy and nutritious diet, and drink enough water throughout the day.  Practice ways of relaxing (including yoga or meditation).  Exercise regularly.  Make plans to change situations that cause stress. Act on those plans so that stresses decrease over time. Keep your work and personal routine reasonable.  Avoid street drugs and minimize use of alcohol.  Start taking a daily multivitamin after consulting your caregiver. SEEK MEDICAL CARE IF:   You have persistent tiredness, which cannot be accounted for.  You have fever.  You have unintentional weight loss.  You have headaches.  You have disturbed sleep throughout the night.  You are feeling sad.  You have constipation.  You have dry skin.  You have gained weight.  You are taking any new or different medicines that you suspect are causing fatigue.  You are unable to sleep at night.  You develop any unusual swelling of your legs or other parts of your body. SEEK IMMEDIATE MEDICAL CARE IF:   You are feeling confused.  Your vision is  blurred.  You feel faint or pass out.  You develop severe headache.  You develop severe abdominal, pelvic, or back pain.  You develop chest pain, shortness of breath, or an irregular or fast heartbeat.  You are  unable to pass a normal amount of urine.  You develop abnormal bleeding such as bleeding from the rectum or you vomit blood.  You have thoughts about harming yourself or committing suicide.  You are worried that you might harm someone else. MAKE SURE YOU:   Understand these instructions.  Will watch your condition.  Will get help right away if you are not doing well or get worse. Document Released: 03/30/2007 Document Revised: 08/25/2011 Document Reviewed: 10/04/2013 [facility] Patient Information 2015 [place], [place]. This information is not intended to replace advice given to you by your health care provider. Make sure you discuss any questions you have with your health care provider.

## 2014-07-28 NOTE — ED Provider Notes (Signed)
CSN: [id number]     Arrival date & time 07/28/14  1358 History   None    Chief Complaint  Patient presents with  . Fatigue      HPI   Patient presents for evaluation of "Lethargic".  Patient presents via EMS after home health care nursing was at the house today. Nurse, and Y felt that he was "lethargic". Wife states that he is "been this way since last night". He has been home for 2 days from [facility]. Wife states he was there for "an infection, an infection by his heart, he had be cardioverted, and he had pneumonia".  Patient's history coronary bypass grafting. Wife states he was admitted for a week 2 weeks ago, was home for a short time, and then readmitted. Wife states during the second hospitalization he had heart catheterization and there were "no blockages". She states that he was told on his first admission that "he had had a small heart attack".  Since being home he has been up and around very little. Compliant with medications. States he "thinks so that he is taking an antibiotic.     Past Medical History  Diagnosis Date  . MI (myocardial infarction)   . Sepsis   . History of cardioversion    Past Surgical History  Procedure Laterality Date  . Quadruple bypass    . Knee surgery     History reviewed. No pertinent family history. History  Substance Use Topics  . Smoking status: Never Smoker   . Smokeless tobacco: Not on file  . Alcohol Use: No    Review of Systems  Constitutional: Positive for activity change and fatigue. Negative for fever, chills, diaphoresis and appetite change.  HENT: Negative for mouth sores, sore throat and trouble swallowing.   Eyes: Negative for visual disturbance.  Respiratory: Negative for cough, chest tightness, shortness of breath and wheezing.   Cardiovascular: Negative for chest pain.  Gastrointestinal: Negative for nausea, vomiting, abdominal pain, diarrhea and abdominal distention.  Endocrine: Negative for  polydipsia, polyphagia and polyuria.  Genitourinary: Negative for dysuria, frequency and hematuria.  Musculoskeletal: Negative for gait problem.  Skin: Negative for color change, pallor and rash.  Neurological: Negative for dizziness, syncope, light-headedness and headaches.  Hematological: Does not bruise/bleed easily.  Psychiatric/Behavioral: Negative for behavioral problems and confusion.      Allergies  Review of patient's allergies indicates no known allergies.  Home Medications   Prior to Admission medications   Medication Sig Start Date End Date Taking? Authorizing Provider  amiodarone (PACERONE) 200 MG tablet Take 200 mg by mouth daily.   Yes Historical Provider, MD  amoxicillin (AMOXIL) 500 MG tablet Take 1,000 mg by mouth 2 (two) times daily.   Yes Historical Provider, MD  aspirin EC 325 MG tablet Take 325 mg by mouth daily.   Yes Historical Provider, MD  budesonide-formoterol (SYMBICORT) 160-4.5 MCG/ACT inhaler Inhale 2 puffs into the lungs 2 (two) times daily.   Yes Historical Provider, MD  diltiazem (TIAZAC) 300 MG 24 hr capsule Take 300 mg by mouth daily.   Yes Historical Provider, MD  furosemide (LASIX) 40 MG tablet Take 40 mg by mouth daily.   Yes Historical Provider, MD  glimepiride (AMARYL) 4 MG tablet Take 4 mg by mouth daily.   Yes Historical Provider, MD  guaiFENesin (MUCINEX) 600 MG 12 hr tablet Take 600 mg by mouth 2 (two) times daily.   Yes Historical Provider, MD  Hydrocodone-Chlorpheniramine (CHLORPHENIRAMINE-HYDROCODONE PO) Take 5 mLs by mouth  every 12 (twelve) hours as needed (cough).   Yes Historical Provider, MD  insulin aspart (NOVOLOG) 100 UNIT/ML injection Inject 20 Units into the skin 2 (two) times daily with a meal.   Yes Historical Provider, MD  LORazepam (ATIVAN) 1 MG tablet Take 1 mg by mouth every 4 (four) hours as needed for anxiety.   Yes Historical Provider, MD  metFORMIN (GLUCOPHAGE) 1000 MG tablet Take 1,000 mg by mouth 2 (two) times daily with  a meal.   Yes Historical Provider, MD  metoprolol (LOPRESSOR) 100 MG tablet Take 100 mg by mouth 2 (two) times daily.   Yes Historical Provider, MD  montelukast (SINGULAIR) 10 MG tablet Take 10 mg by mouth at bedtime.   Yes Historical Provider, MD  nitroGLYCERIN (NITRODUR - DOSED IN MG/24 HR) 0.6 mg/hr patch Place 0.6 mg onto the skin daily.   Yes Historical Provider, MD  nitroGLYCERIN (NITROSTAT) 0.4 MG SL tablet Place 0.4 mg under the tongue every 5 (five) minutes as needed for chest pain.   Yes Historical Provider, MD  pantoprazole (PROTONIX) 40 MG tablet Take 40 mg by mouth daily.   Yes Historical Provider, MD  pramipexole (MIRAPEX) 0.5 MG tablet Take 0.5 mg by mouth at bedtime.   Yes Historical Provider, MD  pregabalin (LYRICA) 75 MG capsule Take 75 mg by mouth 2 (two) times daily.   Yes Historical Provider, MD  Rivaroxaban (XARELTO) 15 MG TABS tablet Take 15 mg by mouth daily with supper.   Yes Historical Provider, MD  simvastatin (ZOCOR) 40 MG tablet Take 40 mg by mouth daily.   Yes Historical Provider, MD  traZODone (DESYREL) 100 MG tablet Take 100 mg by mouth at bedtime.   Yes Historical Provider, MD   BP 145/68 mmHg  Pulse 67  Temp(Src) 98.8 F (37.1 C) (Oral)  Resp 17  SpO2 97% Physical Exam  Constitutional: He is oriented to person, place, and time. He appears well-developed and well-nourished. No distress.  Sleeping. He awakens to voice. He is oriented and appropriate. Does not appear tachypneic or distressed.  HENT:  Head: Normocephalic.  Eyes: Conjunctivae are normal. Pupils are equal, round, and reactive to light. No scleral icterus.  Neck: Normal range of motion. Neck supple. No thyromegaly present.  Cardiovascular: Normal rate and regular rhythm.  Exam reveals no gallop and no friction rub.   No murmur heard. Pulmonary/Chest: Effort normal and breath sounds normal. No respiratory distress. He has no wheezes. He has no rales.  Abdominal: Soft. Bowel sounds are normal. He  exhibits no distension. There is no tenderness. There is no rebound.  Musculoskeletal: Normal range of motion.  Neurological: He is alert and oriented to person, place, and time.  Symmetric neurological exam. No pronator drift. No leg weakness. No cranial nerve deficits.  Skin: Skin is warm and dry. No rash noted.  Edema, 2+ left leg, 1+ in the right.  Psychiatric: He has a normal mood and affect. His behavior is normal.    ED Course  Procedures (including critical care time) Labs Review Labs Reviewed  BRAIN NATRIURETIC PEPTIDE - Abnormal; Notable for the following:    B Natriuretic Peptide 292.9 (*)    All other components within normal limits  CBC WITH DIFFERENTIAL/PLATELET - Abnormal; Notable for the following:    RBC 4.12 (*)    Hemoglobin 11.4 (*)    HCT 35.2 (*)    Eosinophils Relative 6 (*)    All other components within normal limits  COMPREHENSIVE METABOLIC PANEL -  Abnormal; Notable for the following:    Glucose, Bld 127 (*)    Albumin 2.8 (*)    ALT 59 (*)    All other components within normal limits  I-STAT ARTERIAL BLOOD GAS, ED - Abnormal; Notable for the following:    pCO2 arterial 49.0 (*)    Bicarbonate 30.7 (*)    Acid-Base Excess 5.0 (*)    All other components within normal limits  URINE CULTURE  URINALYSIS, ROUTINE W REFLEX MICROSCOPIC  PROTIME-INR  I-STAT TROPOININ, ED  I-STAT CG4 LACTIC ACID, ED  I-STAT CG4 LACTIC ACID, ED    Imaging Review Ct Head Wo Contrast  07/28/2014   CLINICAL DATA:  Increasing lethargy  EXAM: CT HEAD WITHOUT CONTRAST  TECHNIQUE: Contiguous axial images were obtained from the base of the skull through the vertex without intravenous contrast.  COMPARISON:  None.  FINDINGS: The bony calvarium is intact. The paranasal sinuses and mastoid air cells are well aerated. No findings to suggest acute hemorrhage, acute infarction or space-occupying mass lesion are noted. Basal ganglia calcifications are seen.  IMPRESSION: No acute  abnormality noted.   Electronically Signed   By: Inez Catalina M.D.   On: 07/28/2014 16:38   Dg Chest Port 1 View  07/28/2014   CLINICAL DATA:  Patient with shortness of breath and weakness. Prior pneumonia.  EXAM: PORTABLE CHEST - 1 VIEW  COMPARISON:  Chest radiograph 07/25/2014  FINDINGS: Multiple monitoring leads overlie the patient. Patient status post median sternotomy and CABG procedure. Persistent to slight interval improvement in bilateral heterogeneous pulmonary opacities. More focal consolidation within the right lower lung. No definite pleural effusion or pneumothorax.  IMPRESSION: Slight interval improvement in bilateral heterogeneous pulmonary opacities, potentially secondary to improving pulmonary edema. Underlying infectious process, particularly involving the right lower lobe is not excluded. Recommend continued radiographic followup to ensure resolution.   Electronically Signed   By: Lovey Newcomer M.D.   On: 07/28/2014 15:04     EKG Interpretation None      MDM   Final diagnoses:  Weakness   patient remains stable here. He is awake and alert every time I enter the room. Although one time I do enter,  and he is sleeping he awakens to voice and is immediately oriented. He has reassuring testing. PCO2 slightly high at 49. He is well oxygenated on his 2 L that he left the hospital with. Thus the decreases to 1 L at home and recheck with his physician in the next week to consider discontinuing. His urine does not appear infected. No leukocytosis. BNP 292. Decrease in infiltrate in the right lung base normal CT head. He did return from the hospital on Ativan 3 times a day when necessary for anxiety, as well as Tussionex for his cough. I have asked him to discontinue both of these.  This may be contributing somewhat to his somnolence. Not retaining CO2. This may be deconditioning and fatigue secondary to his recent hospitalization and illness. Return if any worsening symptoms.    Tanna Furry, MD 07/29/14 1728

## 2014-07-30 LAB — URINE CULTURE
COLONY COUNT: NO GROWTH
CULTURE: NO GROWTH

## 2014-08-14 ENCOUNTER — Emergency Department: Payer: Self-pay | Admitting: Emergency Medicine

## 2014-10-02 ENCOUNTER — Encounter: Payer: Self-pay | Admitting: Internal Medicine

## 2014-10-03 NOTE — Consult Note (Signed)
morbidly obese male admitted and complaining of severe LLE pain and swelling.  Progressive over several months.  Has some symptoms on right but not as bad.  Stasis changes present.  Needs to start wearing compression stockings and Rx given today.  Will see in office for follow up for reflux study as not available in hospital.  No DVT was present on basic [place] here.  Electronic Signatures: Algernon Huxley (MD)  (Signed on 07-Nov-13 12:54)  Authored  Last Updated: 07-Nov-13 12:54 by Algernon Huxley (MD)

## 2014-10-03 NOTE — H&P (Signed)
PATIENT NAME:  Marcus, Williams MR#:  [id number] DATE OF BIRTH:  [birth year]  DATE OF ADMISSION:  04/21/2012  CHIEF COMPLAINT: Fever.   HISTORY OF PRESENT ILLNESS: [name] is a pleasant 59 year old male with a history of sudden onset of fever, cough, and chills today. 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  He asked for a driver to come take over for him.  He went to the walk-in clinic at [facility] and  was promptly sent to the Emergency Room. There his temperature was found to be 102.7. He has had a dry cough today. He has felt short of breath. He was noted to be tachycardic, almost 120 initially in the Emergency Room. He was given IV fluids and it has come down some. He notes history of sleep apnea, uses a home unit, snores unless he is on his left side, he says. He notes a headache he has today as well. His work-up in the Emergency Room included a negative flu swab, negative left lower extremity ultrasound as he has had some edema in that leg.   REVIEW OF SYSTEMS: Negative other than above.   PAST MEDICAL HISTORY:  1. Hypertension.  2. Type 2 diabetes from 2001.  3. Coronary artery disease status post CABG 2006. 4. Steatohepatitis from 2010.  5. Obesity.  6. Sleep apnea.  7. Hyperlipidemia.  8. Degenerative disk disease.  9. Diabetic neuropathy.  10. History of Bell's palsy.   PAST SURGICAL HISTORY:  1. Foot surgery for bone spurs.  2. Tonsillectomy.  3. Coronary artery bypass grafting. 4. Right knee arthroscopy.   ALLERGIES: No known drug allergies.   MEDICATIONS: Per the last list here in [facility]: 1. Aspirin 325 mg daily.  2. Diovan 160 mg daily.  3. Lopressor 50 mg daily.  4. Omeprazole 20 mg daily.  5. Flexeril p.r.n.  6. Glimepiride 4 mg daily.  7. Actos 45 mg daily.  8. Metformin 1000 mg b.i.d.   SOCIAL HISTORY: No tobacco or alcohol continuously, does drink beer on occasion. He is married. Works driving a dump truck  as noted.   FAMILY HISTORY: Coronary artery disease is noted.   PHYSICAL EXAMINATION:  GENERAL: Obese, lying on a gurney, no acute distress.   VITAL SIGNS: Pulse approximately 108 presently, temperature last noted 102.7, respirations 20, blood pressure 197/116 on admission.  HEENT:  Normocephalic, atraumatic. Tympanic membranes are clear. Oropharynx clear.   NECK: No bruits, thyromegaly, adenopathy, or jugular venous distention.   HEART: Tachycardia. No murmurs or gallops.   CHEST: Diminished but clear. No distress.   ABDOMEN: Soft, flat, nontender. No hepatosplenomegaly, bruits, masses, etc.   EXTREMITIES: 1+ edema, slightly asymmetric, left greater than right.  NEURO: Nonfocal.   LABORATORY, DIAGNOSTIC, AND RADIOLOGICAL DATA:   White count of 18,000. Differential not done but I have ordered it and it is pending. CK and troponin are normal. Glucose 184. Flu swab negative. Magnesium is 1.3. Chest x-ray again was done and is not available with reading yet, thought to be equivocal for infiltrates.   ASSESSMENT AND PLAN:  Tachycardia and fever, subjective shortness of breath: Worrisome for developing sepsis though  source is unclear. Rule out tickborne disease though cough is unlikely. We will treat with doxycycline, treat with IV Levaquin awaiting further blood cultures.  Hopefully he will turn around quickly and be able to go home in the morning, so we will keep him in observation at this  point.  Keep on sliding scale insulin, hold metformin in case he is becoming septic as noted. Watch his blood pressure on his usual regimen as well. P.r.n. Flexeril for cramps. Hopefully magnesium supplementation which has been done by the emergency medicine physician will help as well.    ____________________________ Ocie Cornfield. Ouida Sills, MD mwa:[id number] D: 04/21/2012 18:52:06 ET T: 04/22/2012 06:58:48 ET JOB#: [id number]  cc: Ocie Cornfield. Ouida Sills, MD, <Dictator> Kirk Ruths MD ELECTRONICALLY  SIGNED 04/22/2012 7:27

## 2014-10-06 NOTE — Discharge Summary (Signed)
PATIENT NAME:  Marcus Williams, Marcus Williams MR#:  [id number] DATE OF BIRTH:  22-Jun-1955  DATE OF ADMISSION:  08/22/2012 DATE OF DISCHARGE:  08/25/2012  REASON FOR ADMISSION: Chest pain with supraventricular tachycardia and elevated troponin.   HISTORY OF PRESENT ILLNESS:  Please see the dictation done by myself on 08/22/2012.  PAST MEDICAL HISTORY: 1.  Morbid obesity.  2.  ASCVD status post CABG.  3.  Venostasis with peripheral edema. 4.  PSVT.  5.  Hyperlipidemia. 6.  Gastroesophageal reflux disease.  7.  Type 2 diabetes.  8.  Benign hypertension.  9.  Status post left foot surgery.  10.  Status post right knee surgery.   MEDICATIONS ON ADMISSION: Please see admission note.   ALLERGIES: No known drug allergies.  SOCIAL HISTORY, FAMILY HISTORY AND REVIEW OF SYSTEMS:  As per admission note.   PHYSICAL EXAM: The patient was in no acute distress. Vital signs were stable and he was afebrile. HEENT exam was unremarkable. Neck was supple without JVD. Lungs were clear. Cardiac exam revealed a regular rate and rhythm with normal S1 and S2. Abdomen was soft and nontender. Extremities revealed 2+ edema. Neurologic exam was grossly nonfocal.   HOSPITAL COURSE: The patient was admitted with SVT.  Improved with adenosine in the Emergency Room. He did have some chest pain with an elevated troponin. Follow-up enzymes were worse consistent with an acute non-ST-elevation MI.  The patient remained stable without further SVT while in the hospital. He was seen in consultation by cardiology. Cardiac catheterization was performed and revealed stable 3-vessel disease, which was to be medically managed. The patient's sugars were observed during his hospitalization and they remained stable. By 08/25/2012, the patient was stable and ready for discharge.   DISCHARGE DIAGNOSES: 1.  Acute non-ST-elevation myocardial infarction.  2.  Supraventricular tachycardia.  3.  Type 2 diabetes.  4.  Venostasis with peripheral edema. 5.   Essential hypertension.  6.  Hyperlipidemia.  7.  Atherosclerotic cardiovascular disease status post coronary artery bypass graft.   DISCHARGE MEDICATIONS: 1.  Aspirin 325 mg p.o. daily.  2.  Mirapex 0.5 mg p.o. daily.  3.  Zocor 40 mg p.o. at bedtime.  4.  Gabapentin 300 mg p.o. t.i.d.  5.  Metformin 1000 mg p.o. b.i.d.  6.  Actos 15 mg p.o. daily.  7.  Diovan 160 mg p.o. daily.  8.  Flexeril 10 mg p.o. q. 8 hours p.r.n.  9.  Amaryl 4 mg p.o. b.i.d.  10.  Magnesium supplement daily.  11.  Trazodone 100 mg p.o. at bedtime.  12.  Imdur 30 mg p.o. daily.  13.  Nitrostat p.r.n. chest pain.  14.  Toprol-XL 100 mg p.o. daily.   FOLLOW-UP PLANS AND APPOINTMENTS: The patient was discharged on a low sodium, carbohydrate controlled diet. He will follow up with me in 1 to 2 weeks, sooner if needed.  ____________________________ Leonie Douglas Doy Hutching, MD jds:sb D: 09/03/2012 13:10:58 ET     T: 09/03/2012 13:38:25 ET         JOB#: [id number] cc: Leonie Douglas. Doy Hutching, MD, <Dictator> JEFFREY Lennice Sites MD ELECTRONICALLY SIGNED 09/03/2012 15:04

## 2014-10-06 NOTE — H&P (Signed)
DATE OF BIRTH:  10-21-1955  DATE OF ADMISSION:  08/22/2012  REFERRING PHYSICIAN:  Dr. Cinda Quest   FAMILY PHYSICIAN:  Dr. Doy Hutching  REASON FOR ADMISSION:  Chest pain, with elevated troponin and PSVT.   HISTORY OF PRESENT ILLNESS: The patient is a 59 year old male with a history of morbid obesity, diabetes and coronary artery disease, status post previous CABG. He presents to the Emergency Room with a one-hour history of chest pain, described as pressure, which was substernal, radiating to the left chest wall. It was associated with shortness of breath and diaphoresis. No nausea or vomiting. Symptoms were 7 out of 10 in severity, and lasted approximately one hour. In the Emergency Room, the patient was noted to be in rapid SVT, with a rate of 165. EKG at the time showed ST segment depression. Initial troponin was negative, but repeat troponin done 4 hours later was elevated, consistent with non-ST elevation MI. He is now admitted for further evaluation. Currently, the patient is chest pain free and is not short of breath. He was given adenosine in the Emergency Room, with resolution of his SVT.    PAST MEDICAL HISTORY: 1.  Morbid obesity.  2.  ASCVD, status post CABG.   3.  Venostasis with peripheral edema.  4.  History of PSVT.  5.  Hyperlipidemia.  6.  GE reflux disease.  7.  Type 2 diabetes.  8.  Benign hypertension.  9.  Status post right knee surgery.  10.  Status post left foot surgery.   MEDICATIONS: 1.  Victoza 1.8 mg subcu daily.  2.  Zocor 40 mg p.o. at bedtime.  3.  Mirapex 0.5 mg p.o. at bedtime.  4.  Omeprazole 20 mg p.o. b.i.d.  5.  Diovan 160 mg p.o. daily.  6.  Actos 15 mg p.o. daily.  7.  Lopressor 50 mg p.o. daily.  8.  Metformin 1000 mg p.o. b.i.d.  9.  Amaryl 4 mg p.o. b.i.d.  10.  Neurontin 300 mg p.o. t.i.d.  11.  Flexeril 10 mg p.o. q. 8 hours p.r.n.  12.  Aspirin 325 mg p.o. daily.  13.  Nitrostat 0.4 mg sublingually p.r.n. chest pain.   ALLERGIES:  No known  drug allergies.   SOCIAL HISTORY:  No apparent history of alcohol or tobacco abuse.   FAMILY HISTORY:  Positive for diabetes and coronary artery disease. Negative for prostate or colon cancer.   REVIEW OF SYSTEMS:  CONSTITUTIONAL:  No fever or change in weight.  EYES:  No blurred or double vision. No glaucoma.  EARS, NOSE, THROAT: No tinnitus or hearing loss. No nasal discharge or bleeding. No difficulty swallowing.  RESPIRATORY:  No cough or wheezing. Denies hemoptysis. No painful respiration.  CARDIOVASCULAR: Has had chest pain, orthopnea and palpitations. No presyncope or syncope. Has had worsening edema.  GASTROINTESTINAL:  No nausea, vomiting or diarrhea. No abdominal pain. No change in bowel habits.  GENITOURINARY:  No dysuria or hematuria. No incontinence.  ENDOCRINE:  No polyuria or polydipsia. No heat or cold intolerance.  HEMATOLOGIC:  The patient denies anemia, easy bruising or bleeding.  LYMPHATIC:  No swollen glands.  MUSCULOSKELETAL:  Patient denies pain in his neck, back, shoulders, knees, hips. No gout.  NEUROLOGIC:  No numbness or migraines. Denies stroke or seizures.  PSYCHIATRIC:  The patient denies anxiety, insomnia or depression.   PHYSICAL EXAMINATION: GENERAL:  The patient is obese, in no acute distress.  VITAL SIGNS: Currently remarkable for a blood pressure of 153/74, heart rate 76,  and a respiratory rate of 20. He is afebrile.  HEENT: Normocephalic, atraumatic. Pupils equally round and reactive to light and accommodation. Extraocular movements are intact. Sclerae are not icteric. Conjunctivae are clear. Oropharynx is clear.  NECK:  Supple, without JVD. No adenopathy or thyromegaly is noted.  LUNGS:  Clear to auscultation and percussion, without wheezes, rales or rhonchi. No dullness.  CARDIAC: Exam is a regular rate and rhythm, with normal S1, S2. No significant rubs, murmurs or gallops. PMI is nondisplaced. Chest wall is nontender.  ABDOMEN:  Obese, but soft and  nontender, with normoactive bowel sounds. No organomegaly or masses were appreciated. No hernias or bruits were noted.  EXTREMITIES:  Revealed 1 to 2+ edema bilaterally, with stasis changes. Pulses were 2+ bilaterally.  SKIN:  Warm and dry, without rash or lesions.  NEUROLOGIC: Exam revealed cranial nerves II through XII grossly intact. Deep tendon reflexes were symmetric. Motor and sensory exam nonfocal.  PSYCHIATRIC:  Exam revealed the patient was alert and oriented to person, place and time. He was cooperative and used good judgment.   LABORATORY DATA:  Initial EKG revealed SVT at 181 beats per minute, with lateral ST segment depression. After adenosine, EKG normalized to sinus tachycardia, with no acute ischemic changes. Labs revealed a total CK of 194 with an MB of 7.7 and a troponin of 1.4. Glucose was 269 with a BUN of 19 and creatinine of 0.83, with a sodium of 134, magnesium of 1.5.  CBC was within normal limits.   ASSESSMENT: 1.  Acute non-ST elevation myocardial infarction.  2.  Supraventricular tachycardia, resolved.  3.  Type 2 diabetes.  4.  Morbid obesity.  5.  Atherosclerotic cardiovascular disease, status post coronary artery bypass graft.  6.  Benign hypertension.  7.  Hyponatremia.  8.  Hypomagnesemia.   PLAN:  The patient will be admitted to telemetry, with aspirin and Lovenox. Will increase his beta blocker at this time, and continue his angiotensin receptor blocker. Will follow his sugars with Accu-Cheks before meals and at bedtime, and add sliding scale insulin as needed. Will hold metformin for now. Will follow serial cardiac enzymes, and obtain an echocardiogram. Will consult Dr. Ubaldo Glassing, his cardiologist, in the morning for further evaluation and possible cardiac catheterization. Will supplement his magnesium at this time. Begin IV normal saline. Follow up routine labs in the morning. Further treatment and evaluation will depend upon the patient's progress.   Total time  spent on this patient was 50 minutes.     ____________________________ Leonie Douglas Doy Hutching, MD jds:mr D: 08/22/2012 20:27:21 ET T: 08/22/2012 20:39:52 ET JOB#: [id number]  cc: Leonie Douglas. Doy Hutching, MD, <Dictator> JEFFREY Lennice Sites MD ELECTRONICALLY SIGNED 08/23/2012 9:00

## 2014-10-06 NOTE — Consult Note (Signed)
General Aspect 59 yo malae with history of cad s/p cabg, history of copd, history of svt now admitted after developing a severe chest paiin and presented to the er where he was noted to have svt with rate of 165. He has ruled in with a nstemi with a mild serum tropinin elevation. He improved as his heart rate improved. He sates he has been short of breath for some time but this has worsened gradually. He states he has been compliant with meds. He is currently hemodynamically stable.   Physical Exam:  GEN obese   HEENT PERRL   NECK supple   RESP normal resp effort   CARD Regular rate and rhythm  Normal, S1, S2  Murmur   Murmur Systolic   Systolic Murmur axilla   ABD denies tenderness  normal BS   LYMPH negative neck, negative axillae   EXTR negative cyanosis/clubbing, negative edema   SKIN normal to palpation   NEURO cranial nerves intact   PSYCH A+O to time, place, person   Review of Systems:  Subjective/Chief Complaint shortness of breath and chest pain   General: No Complaints   Skin: No Complaints   ENT: No Complaints   Eyes: No Complaints   Neck: No Complaints   Respiratory: Short of breath   Cardiovascular: Chest pain or discomfort   Gastrointestinal: No Complaints   Genitourinary: No Complaints   Vascular: No Complaints   Musculoskeletal: No Complaints   Neurologic: No Complaints   Hematologic: No Complaints   Endocrine: No Complaints   Psychiatric: No Complaints   Review of Systems: All other systems were reviewed and found to be negative   Medications/Allergies Reviewed Medications/Allergies reviewed        Admit Diagnosis:   ACUTE MI SVT HYPOMAGNESEMIA DIABETES..: Onset Date: 23-Aug-2012, Status: Active, Description: ACUTE MI SVT HYPOMAGNESEMIA DIABETES..      Admit Reason:   Diabetes (250.00): Status: Active, Coding System: ICD9, Coded Name: Type II or unspecified type diabetes mellitus without mention of complication, not stated as  uncontrolled   Hypomagnesemia (275.2): Status: Active, Coding System: ICD9, Coded Name: Disorders of magnesium metabolism   SVT (supraventricular tachycardia) (427.89): Status: Active, Coding System: ICD9, Coded Name: Other specified cardiac dysrhythmias   AMI (acute myocardial infarction) (410.90): Status: Active, Coding System: ICD9, Coded Name: Acute myocardial infarction, unspecified site, episode of care unspecified  Home Medications: Medication Instructions Status  Zithromax Z-Pak 250 mg oral tablet 2 tabs (500mg ) orally now, then take 1 tab orally once a day x 4 days. Active  omeprazole 20 mg oral delayed release capsule 1 cap(s) orally 2 times a day Active  pramipexole 0.5 mg oral tablet 1 tab(s) orally once a day Active  simvastatin 40 mg oral tablet 1 tab(s) orally once a day (at bedtime) Active  gabapentin 300 mg oral capsule 1 cap(s) orally 3 times a day Active  metformin 1000 mg oral tablet 1 tab(s) orally 2 times a day Active  Actos 15 mg oral tablet 1 tab(s) orally once a day Active  Diovan 160 mg oral tablet 1 tab(s) orally once a day Active  cyclobenzaprine 10 mg oral tablet 1 tab(s) orally every 8 hours as needed for pain.  Active  glimepiride 4 mg oral tablet 1 tab(s) orally 2 times a day Active  magnesium gluconate 1 cap(s) orally once a day Active  metoprolol tartrate 50 mg oral tablet 1 tab(s) orally 2 times a day Active  trazodone 100 mg oral tablet 1 tab(s) orally once  a day (at bedtime) Active  aspirin 325 mg oral tablet 1 tab(s) orally once a day Active   EKG:  EKG NSR   Abnormal NSSTTW changes    No Known Allergies:    Impression 59 yo male with history of cad s/p cabg admitted after developing chest pain and noted to have svt. He has ruled in for an nstemi Etiology have been secondary to the svt vs acute ischmeic event. He is stable at present.   Plan 1. Continue with current meds 2. Continue beta blockers, nitates, heparin and asa 3 Risk and benefits  of cardiac cath explained 4. Proceed with cardiac cath in am 5. Further recs after cath   Electronic Signatures: Teodoro Spray (MD)  (Signed 10-Mar-14 21:29)  Authored: General Aspect/Present Illness, History and Physical Exam, Review of System, Health Issues, Home Medications, EKG , Allergies, Impression/Plan   Last Updated: 10-Mar-14 21:29 by Teodoro Spray (MD)

## 2014-10-07 NOTE — H&P (Signed)
PATIENT NAME:  Marcus Williams, Marcus Williams MR#:  [id number] DATE OF BIRTH:  [birth year]  DATE OF ADMISSION:  03/11/2014  ADMITTING PHYSICIAN: Gladstone Lighter, MD.   PRIMARY CARE PHYSICIAN: Marcus Douglas. Sparks, MD.   CHIEF COMPLAINT: Left flank pain and chills.   HISTORY OF PRESENT ILLNESS: Marcus Williams is a 59 year old, obese, Caucasian male with past medical history significant for coronary artery disease status post bypass graft surgery, diabetes and hypertension, who presents to the hospital secondary to left flank pain that started about 2 weeks ago. The patient says he has been having more mid back pain, radiating down the left hamstrings to the feet. He saw his PCP. They were in the process of getting an MRI done. He says this left flank pain is different from that pain that he has been experiencing. It radiates across his abdomen to the groin region. He also has left upper abdominal pain, which got worse last night, associated with intense vomiting. Even during my exam, the patient was miserable secondary to constant nausea and vomiting. He presented to the ER. CT of the abdomen revealing bilateral perinephric edema. No evidence of any infection. UA looks clean, but because of his intense pain and elevated white, he is being admitted for possible early pyelonephritis.   PAST MEDICAL HISTORY: 1. Morbid obesity.  2. Coronary artery disease status post bypass graft surgery.  3. History of SVT.  4. Hyperlipidemia.  5. Gastroesophageal reflux disease.  6. Insulin-dependent diabetes mellitus.  7. Hypertension.   PAST SURGICAL HISTORY: 1. Right knee surgery.  2. Left foot surgery.  3. Coronary artery bypass graft surgery.   ALLERGIES: No known drug allergies.   CURRENT HOME MEDICATIONS:  1. Aspirin 81 mg p.o. daily.  2. Diovan/hydrochlorothiazide 12.5/80 mg p.o. daily. 3. Glimepiride 4 mg p.o. b.i.d.  4. Lyrica 75 mg p.o. b.i.d.  5. Metformin 1000 mg p.o. b.i.d.  6. Metoprolol 50 mg p.o. daily.   7. Sublingual nitroglycerin p.r.n. for chest pain.  8. Novolin N human recombinant insulin 80 units once a day in the morning.  9. Novolin N 100 units at bedtime.  10. Protonix 20 mg p.o. daily.  11. Pramipexole 0.5 mg p.o. at bedtime.  12. Simvastatin 40 mg p.o. daily.  13. Trazodone 100 mg p.o. at bedtime.   SOCIAL HISTORY: Lives at home with his wife. No smoking or alcohol use currently.   FAMILY HISTORY: Significant for diabetes and heart disease in the family. No history of any cancer.   REVIEW OF SYSTEMS:  CONSTITUTIONAL: No fever, fatigue or weakness.  EYES: No blurred vision, double vision, inflammation or glaucoma.  ENT: No tinnitus, ear pain, hearing loss, epistaxis or discharge.  RESPIRATORY: No cough, wheeze, hemoptysis or COPD.  CARDIOVASCULAR: No chest pain, orthopnea, edema, arrhythmia, palpitations or syncope.  GASTROINTESTINAL: Positive for nausea, vomiting. No diarrhea. Positive for abdominal pain. No hematemesis or melena.  GENITOURINARY: Positive for dysuria. No hematuria, renal calculus, frequency or continence.  ENDOCRINE: No polyuria, nocturia. Thyroid problems. Heat or cold intolerance.  HEMATOLOGY: No anemia, easy bruising or bleeding.  SKIN: No acne, rash or lesions.  MUSCULOSKELETAL: Positive for low back pain radiating down his left thigh. No arthritis or gout.  NEUROLOGIC: No numbness, weakness, CVA, TIA or seizures.  PSYCHOLOGICAL: No anxiety, insomnia, depression.   PHYSICAL EXAMINATION: VITAL SIGNS: Temperature 98.8 degrees Fahrenheit, pulse 124, respirations 20, blood pressure 164/80, pulse oximetry 96% on room air.  GENERAL: Heavily built, well-nourished male, sitting in bed, not in any acute distress.  HEENT: Normocephalic, atraumatic. Pupils equal, round, reacting to light. Anicteric sclerae. Extraocular movements intact. Oropharynx is clear without erythema, mass or exudates.  NECK: Supple. No thyromegaly, JVD or carotid bruits. No  lymphadenopathy. LUNGS: Moving air bilaterally. No wheezes or crackles. No use of accessory muscles for breathing.  CARDIOVASCULAR: S1 and S2. Regular rate and rhythm. No murmurs, rubs or gallops.  ABDOMEN: Tenderness in the left upper quadrant and also left flank with voluntary guarding present. No rebound tenderness present. Normal bowel sounds. No hepatosplenomegaly. Obese abdomen noted. EXTREMITIES: No pedal edema. No clubbing or cyanosis, 2+ dorsalis pedis pulses palpable bilaterally.  SKIN: No acne, rash or lesions.  LYMPHATICS: No cervical lymphadenopathy.  NEUROLOGIC: Cranial nerves intact. No focal motor or sensory deficits. PSYCHIATRIC: The patient is awake, alert and oriented x 3.   LABORATORY DATA: Urinalysis negative for any infection. WBC 12.3, hemoglobin 17.2, hematocrit 54.2, platelet count 181,000. Sodium 135, potassium 4.4, chloride 107, bicarbonate 20, BUN 27, creatinine 1.07. Glucose 299, calcium of 8.1. ALT 50, AST 23, alkaline phosphatase 83, total bilirubin 0.3 and albumin of 3.0.   DIAGNOSTIC DATA: Chest x-ray showing clear lung fields bilaterally. CABG changes. No acute disease. CT of the abdomen and pelvis showing no obstructing renal calculus, but a ureteral calculus, hydronephrosis or obstructive uropathy, nonspecific perinephric stranding, edema and mesenteric adenitis/panniculitis noted. No other acute intra-abdominal or pelvic findings noted.   ASSESSMENT: A 59 year old, obese male with hypertension, diabetes, coronary artery disease status post coronary artery bypass graft and supraventricular tachycardia, comes in with severe left flank pain and dysuria and CT of the abdomen showing perinephric edema.  1. Abdominal pain. Could be early pyelonephritis the way he is describing his left flank pain, however, the CT of the abdomen showing only minor perinephric edema, though it is bilateral and some adenitis/panniculitis in the mesentery. Follow up blood and urine cultures,  started on IV Invanz, do IV fluids and Phenergan p.r.n. for nausea and vomiting. If does not improve by tomorrow, gastroinestinal consult.  2. Diabetes mellitus. Decrease Lantus dose as the patient is only on a liquid diet, nauseated and vomiting. Sliding scale insulin.  3. Hypertension. Continue home medications. 4. Left sciatica. Outpatient MRI is scheduled already. If pain gets worse, than can do it inpatient as well.  5. He is status post coronary artery bypass graft, stable. Continue home medications.  6. Deep vein thrombosis prophylaxis.   CODE STATUS: Full code.  Time Spent on admission: 50 minutes.     ____________________________ Gladstone Lighter, MD rk:TT D: 03/11/2014 16:22:05 ET T: 03/11/2014 17:19:36 ET JOB#: [id number]  cc: Gladstone Lighter, MD, <Dictator> Marcus Douglas. Doy Hutching, MD Gladstone Lighter MD ELECTRONICALLY SIGNED 03/16/2014 9:51

## 2014-10-07 NOTE — Discharge Summary (Signed)
PATIENT NAME:  Marcus Williams, Marcus Williams MR#:  [id number] DATE OF BIRTH:  05-08-1956  DATE OF ADMISSION:  03/11/2014 DATE OF DISCHARGE:  03/14/2014  REASON FOR ADMISSION:  Left flank pain with chills.   HISTORY OF PRESENT ILLNESS:  Please see the dictated HPI done by Dr. Tressia Miners on 03/11/2014.   PAST MEDICAL HISTORY:  1.  ASCVD, status post CABG.  2.  Obesity.  3.  History of SVT.  4.  Hyperlipidemia.  5.  GE reflux.  6.  Insulin-dependent diabetes mellitus.  7.  Benign hypertension.   MEDICATIONS ON ADMISSION:  Please see admission note.   ALLERGIES:  No known drug allergies.   SOCIAL HISTORY:  As per admission note.   FAMILY HISTORY:  As per admission note.   REVIEW OF SYSTEMS:  As per admission note.   PHYSICAL EXAMINATION:  GENERAL:  The patient was in no acute distress.  VITAL SIGNS:  Stable and he was afebrile.  HEENT:  Exam was unremarkable.  NECK:  Supple without JVD.  LUNGS:  Clear.  CARDIAC:  Exam revealed a regular rate and rhythm. Normal S1, S2.  ABDOMEN:  Soft, but tender in the left upper quadrant and flank. Some guarding. No rebound. Normoactive bowel sounds. No organomegaly or masses were appreciated.  EXTREMITIES:  Without edema.  NEUROLOGIC:  Grossly nonfocal.   HOSPITAL COURSE:  The patient was admitted with abdominal pain, questionably related to pyelonephritis. CT of the abdomen showed some minor perinephric edema, which was inconclusive. He was initially placed on IV antibiotics and IV fluids. He did have some nausea, vomiting and diarrhea. Cultures were negative. The patient was switched to oral antibiotics. He was afebrile. He was tolerating his diet. By 03/14/2014, the patient was stable and ready for discharge.   DISCHARGE DIAGNOSES:  1.  Left abdominal and flank pain of unclear significance.  2.  Presumed diverticulitis.  3.  Degenerative disk disease.  4.  Chronic back pain.  5.  Atherosclerotic cardiovascular disease, status post coronary artery bypass  graft.  6.  Morbid obesity.   DISCHARGE MEDICATIONS:  1.  Glimepiride 4 mg p.o. b.i.d.  2.  Nitrostat 0.4 mg sublingually p.r.n. chest pain.  3.  Lyrica 75 mg p.o. b.i.d.  4.  Trazodone 100 mg p.o. at bedtime.  5.  Simvastatin 40 mg p.o. at bedtime.  6.  Aspirin 81 mg p.o. daily.  7.  Toprol-XL 50 mg p.o. daily.  8.  Metformin 1000 mg p.o. b.i.d.  9.  Mirapex 0.5 mg p.o. q.8 hours.  10.  Protonix 40 mg p.o. daily.  11.  Flagyl 500 mg p.o. t.i.d. x1 week.  12.  Cipro 500 mg p.o. b.i.d. x1 week.  13.  Norco 5/325 mg 1 p.o. q.4 hours p.r.n. pain.  14.  Diovan 80 mg p.o. daily.  15.  Insulin 70/30 as directed.   FOLLOWUP PLANS AND APPOINTMENTS:  The patient was discharged on a low-sodium, carbohydrate -controlled diet. He will follow up with me within 1 week's time, sooner if needed.    ____________________________ Leonie Douglas Doy Hutching, MD jds:ts D: 03/18/2014 12:48:35 ET T: 03/19/2014 03:49:51 ET JOB#: [id number]  cc: Leonie Douglas. Doy Hutching, MD, <Dictator> Jun Osment Lennice Sites MD ELECTRONICALLY SIGNED 03/19/2014 14:01

## 2014-10-07 NOTE — Op Note (Signed)
PATIENT NAME:  Marcus Williams, Marcus Williams MR#:  [id number] DATE OF BIRTH:  01/28/1956  DATE OF PROCEDURE:  12/22/2013  PREOPERATIVE DIAGNOSIS: Osteoarthritis, right knee; medial and lateral meniscus tears.   POSTOPERATIVE DIAGNOSIS:  Osteoarthritis, right knee; medial and lateral meniscus tears.   PROCEDURE: Arthroscopy, right knee, partial medial and lateral meniscectomy.   ANESTHESIA: General.   SURGEON: Hessie Knows, MD   DESCRIPTION OF PROCEDURE: The patient was brought to the operating room and after adequate anesthesia was obtained, the right leg was prepped and draped in the usual sterile fashion with an arthroscopic leg holder and tourniquet applied. After prepped and draped in the usual sterile fashion and appropriate patient identification and timeout procedures completed, an inferolateral portal was made using a prior arthroscopy portal. The arthroscope was introduced and inspection revealed moderate patellofemoral degenerative changes as well as suprapatellar synovitis. This was subsequently debrided with use of a shaver removing  the suprapatellar synovitis. Coming around medially, an inferomedial portal was made. There is evidence of prior partial meniscectomy with thinning of the meniscus. There is significant articular cartilage wear with loss of all the superficial cartilage over essentially the entire femoral condyle and most of the tibial condyle with some fissuring. No loose flaps of significance. On probing, there was a complex tear of the posterior horn of the medial meniscus consistent with preop MRI. With loose flaps, it could be brought in to the joint. An ArthroCare wand was used at this point to ablate this tissue getting back to a stable margin removing most of the posterior horn of the medial meniscus. In the notch, there appeared to be loose connective tissue that may have been old ligament or a portion of the posterior horn of the meniscus that had flipped up into the notch, but this  was also ablated with the use of the wand. Laterally, the articular cartilage had less significant wear. There was not the extensive fissuring, although there was superficial cartilage loss. There was a smaller anterior horn tear of the lateral meniscus involving approximately 2/3 of its thickness that was a flap tear. This was also ablated with use of the ArthroCare wand. After addressing the meniscal pathology, the gutters were checked and there were no loose bodies.   A shaver was then inserted to debride the suprapatellar synovitis.  After this was adequately resected, the knee was irrigated until clear. The wounds were closed with simple interrupted 4-0 nylon skin sutures and the portals infiltrated with 20 mL of 0.5% Sensorcaine with epinephrine. The wound dressed with Xeroform, 4 x 4's, Webril, and Ace wrap. There are no complications.   SPECIMEN: No specimen.   CONDITION TO RECOVERY ROOM: Stable.    ____________________________ Laurene Footman, MD mjm:dd D: 12/22/2013 22:19:04 ET T: 12/23/2013 03:10:44 ET JOB#: [id number]  cc: Laurene Footman, MD, <Dictator> Laurene Footman MD ELECTRONICALLY SIGNED 12/23/2013 7:52

## 2014-10-09 LAB — SURGICAL PATHOLOGY

## 2014-10-13 ENCOUNTER — Ambulatory Visit
Admit: 2014-10-13 | Disposition: A | Discharge status: Home / Self Care | Payer: Self-pay | Attending: Cardiology | Admitting: Cardiology

## 2014-10-14 ENCOUNTER — Inpatient Hospital Stay
Admission: EM | Admit: 2014-10-14 | Discharge: 2014-10-15 | DRG: 292 | Disposition: A | Discharge status: Home / Self Care | Payer: BLUE CROSS/BLUE SHIELD | Attending: Internal Medicine | Admitting: Internal Medicine

## 2014-10-14 DIAGNOSIS — Z951 Presence of aortocoronary bypass graft: Secondary | ICD-10-CM | POA: Diagnosis not present

## 2014-10-14 DIAGNOSIS — I110 Hypertensive heart disease with heart failure: Secondary | ICD-10-CM | POA: Diagnosis not present

## 2014-10-14 DIAGNOSIS — J449 Chronic obstructive pulmonary disease, unspecified: Secondary | ICD-10-CM | POA: Diagnosis present

## 2014-10-14 DIAGNOSIS — Z7951 Long term (current) use of inhaled steroids: Secondary | ICD-10-CM | POA: Diagnosis not present

## 2014-10-14 DIAGNOSIS — I482 Chronic atrial fibrillation: Secondary | ICD-10-CM | POA: Diagnosis present

## 2014-10-14 DIAGNOSIS — G2581 Restless legs syndrome: Secondary | ICD-10-CM | POA: Diagnosis present

## 2014-10-14 DIAGNOSIS — I2510 Atherosclerotic heart disease of native coronary artery without angina pectoris: Secondary | ICD-10-CM | POA: Diagnosis present

## 2014-10-14 DIAGNOSIS — Z794 Long term (current) use of insulin: Secondary | ICD-10-CM

## 2014-10-14 DIAGNOSIS — Z79899 Other long term (current) drug therapy: Secondary | ICD-10-CM | POA: Diagnosis not present

## 2014-10-14 DIAGNOSIS — Z6841 Body Mass Index (BMI) 40.0 and over, adult: Secondary | ICD-10-CM

## 2014-10-14 DIAGNOSIS — I43 Cardiomyopathy in diseases classified elsewhere: Secondary | ICD-10-CM | POA: Diagnosis present

## 2014-10-14 DIAGNOSIS — E119 Type 2 diabetes mellitus without complications: Secondary | ICD-10-CM | POA: Diagnosis present

## 2014-10-14 DIAGNOSIS — I509 Heart failure, unspecified: Secondary | ICD-10-CM

## 2014-10-14 DIAGNOSIS — E669 Obesity, unspecified: Secondary | ICD-10-CM | POA: Diagnosis present

## 2014-10-14 DIAGNOSIS — G629 Polyneuropathy, unspecified: Secondary | ICD-10-CM | POA: Diagnosis present

## 2014-10-14 DIAGNOSIS — Z7982 Long term (current) use of aspirin: Secondary | ICD-10-CM | POA: Diagnosis not present

## 2014-10-14 DIAGNOSIS — K219 Gastro-esophageal reflux disease without esophagitis: Secondary | ICD-10-CM | POA: Diagnosis present

## 2014-10-14 DIAGNOSIS — Z91138 Patient's unintentional underdosing of medication regimen for other reason: Secondary | ICD-10-CM | POA: Diagnosis present

## 2014-10-14 DIAGNOSIS — T501X6A Underdosing of loop [high-ceiling] diuretics, initial encounter: Secondary | ICD-10-CM | POA: Diagnosis present

## 2014-10-14 DIAGNOSIS — I5033 Acute on chronic diastolic (congestive) heart failure: Secondary | ICD-10-CM | POA: Diagnosis present

## 2014-10-14 DIAGNOSIS — Z7902 Long term (current) use of antithrombotics/antiplatelets: Secondary | ICD-10-CM | POA: Diagnosis not present

## 2014-10-14 LAB — BASIC METABOLIC PANEL
ANION GAP: 5 — AB (ref 7–16)
BUN: 18 mg/dL
CALCIUM: 9.1 mg/dL
CHLORIDE: 110 mmol/L
Co2: 25 mmol/L
Creatinine: 0.93 mg/dL
EGFR (African American): >60
EGFR (Non-African Amer.): >60
GLUCOSE: 150 mg/dL — AB
POTASSIUM: 4 mmol/L
SODIUM: 140 mmol/L

## 2014-10-14 LAB — CBC
HCT: 35.7 % — ABNORMAL LOW (ref 40.0–52.0)
HGB: 11.7 g/dL — ABNORMAL LOW (ref 13.0–18.0)
MCH: 27 pg (ref 26.0–34.0)
MCHC: 32.8 g/dL (ref 32.0–36.0)
MCV: 82 fL (ref 80–100)
PLATELETS: 136 10*3/uL — AB (ref 150–440)
RBC: 4.34 10*6/uL — ABNORMAL LOW (ref 4.40–5.90)
RDW: 16.1 % — AB (ref 11.5–14.5)
WBC: 8.5 10*3/uL (ref 3.8–10.6)

## 2014-10-14 LAB — PRO B NATRIURETIC PEPTIDE: B-TYPE NATIURETIC PEPTID: 233 pg/mL — AB

## 2014-10-14 LAB — CK-MB
CK-MB: 6.1 ng/mL — AB
CK-MB: 5.2 ng/mL — AB

## 2014-10-14 LAB — TROPONIN I
Troponin-I: <0.03 ng/mL
Troponin-I: <0.03 ng/mL
Troponin-I: <0.03 ng/mL

## 2014-10-14 MED ORDER — MOMETASONE FURO-FORMOTEROL FUM 100-5 MCG/ACT IN AERO
2.0000 | INHALATION_SPRAY | Freq: Two times a day (BID) | RESPIRATORY_TRACT | Status: DC
  Administered 2014-10-15: 2 via RESPIRATORY_TRACT

## 2014-10-14 MED ORDER — IPRATROPIUM-ALBUTEROL 0.5-2.5 (3) MG/3ML IN SOLN
3.0000 mL | Freq: Four times a day (QID) | RESPIRATORY_TRACT | Status: DC
  Administered 2014-10-15: 3 mL via RESPIRATORY_TRACT
  Filled 2014-10-14: qty 3

## 2014-10-14 MED ORDER — GLIMEPIRIDE 2 MG PO TABS: 4.0000 mg | ORAL_TABLET | Freq: Every day | ORAL | Status: DC

## 2014-10-14 MED ORDER — NITROGLYCERIN 0.4 MG SL SUBL: 0.4000 mg | SUBLINGUAL_TABLET | SUBLINGUAL | Status: DC | PRN

## 2014-10-14 MED ORDER — MOMETASONE FURO-FORMOTEROL FUM 100-5 MCG/ACT IN AERO
2.0000 | INHALATION_SPRAY | Freq: Two times a day (BID) | RESPIRATORY_TRACT | Status: DC
Start: 2014-10-15 — End: 2014-10-14
  Filled 2014-10-14: qty 8.8

## 2014-10-14 MED ORDER — ONDANSETRON HCL 4 MG/2ML IJ SOLN
4.0000 mg | INTRAMUSCULAR | Status: DC | PRN
Start: 2014-10-15 — End: 2014-10-14

## 2014-10-14 MED ORDER — AMIODARONE HCL 200 MG PO TABS
200.0000 mg | ORAL_TABLET | Freq: Every day | ORAL | Status: DC
  Administered 2014-10-15: 200 mg via ORAL
  Filled 2014-10-14: qty 1

## 2014-10-14 MED ORDER — DILTIAZEM HCL ER COATED BEADS 120 MG PO CP24: 300.0000 mg | ORAL_CAPSULE | Freq: Every day | ORAL | Status: DC

## 2014-10-14 MED ORDER — AMIODARONE HCL 200 MG PO TABS: 200.0000 mg | ORAL_TABLET | Freq: Every day | ORAL | Status: DC

## 2014-10-14 MED ORDER — METOPROLOL TARTRATE 25 MG PO TABS: 25.0000 mg | ORAL_TABLET | Freq: Two times a day (BID) | ORAL | Status: DC

## 2014-10-14 MED ORDER — RIVAROXABAN 15 MG PO TABS: 15.0000 mg | ORAL_TABLET | Freq: Every day | ORAL | Status: DC

## 2014-10-14 MED ORDER — FLUTICASONE PROPIONATE 50 MCG/ACT NA SUSP
2.0000 | Freq: Every day | NASAL | Status: DC
  Filled 2014-10-14: qty 16

## 2014-10-14 MED ORDER — IPRATROPIUM-ALBUTEROL 0.5-2.5 (3) MG/3ML IN SOLN: 3.0000 mL | Freq: Four times a day (QID) | RESPIRATORY_TRACT | Status: DC

## 2014-10-14 MED ORDER — ONDANSETRON HCL 4 MG/2ML IJ SOLN
4.0000 mg | INTRAMUSCULAR | Status: DC | PRN
Start: 2014-10-15 — End: 2014-10-15

## 2014-10-14 MED ORDER — SODIUM CHLORIDE 0.9 % IJ SOLN: 3.0000 mL | Freq: Two times a day (BID) | INTRAMUSCULAR | Status: DC

## 2014-10-14 MED ORDER — ACETAMINOPHEN 325 MG PO TABS: 650.0000 mg | ORAL_TABLET | ORAL | Status: DC | PRN

## 2014-10-14 MED ORDER — FUROSEMIDE 10 MG/ML IJ SOLN
60.0000 mg | Freq: Three times a day (TID) | INTRAMUSCULAR | Status: DC
  Administered 2014-10-15: 60 mg via INTRAVENOUS
  Filled 2014-10-14: qty 6

## 2014-10-14 MED ORDER — SODIUM CHLORIDE 0.9 % IJ SOLN
3.0000 mL | INTRAMUSCULAR | Status: DC | PRN
  Administered 2014-10-15: 6 mL via INTRAVENOUS
  Filled 2014-10-14: qty 10

## 2014-10-14 MED ORDER — DILTIAZEM HCL ER COATED BEADS 180 MG PO CP24
300.0000 mg | ORAL_CAPSULE | Freq: Every day | ORAL | Status: DC
  Administered 2014-10-15: 300 mg via ORAL
  Filled 2014-10-14: qty 1

## 2014-10-14 MED ORDER — METOPROLOL TARTRATE 25 MG PO TABS
25.0000 mg | ORAL_TABLET | Freq: Two times a day (BID) | ORAL | Status: DC
  Administered 2014-10-15: 25 mg via ORAL
  Filled 2014-10-14: qty 1

## 2014-10-14 MED ORDER — METFORMIN HCL 500 MG PO TABS
1000.0000 mg | ORAL_TABLET | Freq: Two times a day (BID) | ORAL | Status: DC
  Administered 2014-10-15: 1000 mg via ORAL
  Filled 2014-10-14: qty 2

## 2014-10-14 MED ORDER — FLUTICASONE PROPIONATE 50 MCG/ACT NA SUSP
2.0000 | Freq: Every day | NASAL | Status: DC
  Administered 2014-10-15: 2 via NASAL

## 2014-10-14 MED ORDER — GLIMEPIRIDE 2 MG PO TABS
4.0000 mg | ORAL_TABLET | Freq: Every day | ORAL | Status: DC
  Administered 2014-10-15: 4 mg via ORAL
  Filled 2014-10-14: qty 2

## 2014-10-14 MED ORDER — METFORMIN HCL 500 MG PO TABS: 1000.0000 mg | ORAL_TABLET | Freq: Two times a day (BID) | ORAL | Status: DC

## 2014-10-14 MED ORDER — RIVAROXABAN 15 MG PO TABS: 15.0000 mg | ORAL_TABLET | ORAL | Status: DC

## 2014-10-14 MED ORDER — NITROGLYCERIN 0.4 MG SL SUBL
0.4000 mg | SUBLINGUAL_TABLET | SUBLINGUAL | Status: DC | PRN
Start: 2014-10-15 — End: 2014-10-14

## 2014-10-15 MED ORDER — FUROSEMIDE 40 MG PO TABS
40.0000 mg | ORAL_TABLET | Freq: Two times a day (BID) | ORAL | Status: DC
  Administered 2014-10-15: 40 mg via ORAL
  Filled 2014-10-15: qty 1

## 2014-10-15 MED ORDER — DILTIAZEM HCL ER COATED BEADS 300 MG PO CP24: 300.0000 mg | ORAL_CAPSULE | Freq: Every day | ORAL | Status: DC

## 2014-10-15 MED ORDER — METOPROLOL TARTRATE 25 MG PO TABS: 25.0000 mg | ORAL_TABLET | Freq: Two times a day (BID) | ORAL | Status: DC

## 2014-10-15 MED ORDER — FUROSEMIDE 40 MG PO TABS: 40.0000 mg | ORAL_TABLET | Freq: Two times a day (BID) | ORAL | Status: DC

## 2014-10-15 NOTE — Consult Note (Signed)
PATIENT NAME:  KEISHAUN, HAZEL MR#:  [id number] DATE OF BIRTH:  04-Nov-1955  DATE OF CONSULTATION:  07/21/2014  REFERRING PHYSICIAN:  Leonie Douglas. Doy Hutching, MD CONSULTING PHYSICIAN:  Cheral Marker. Ola Spurr, MD  INFECTIOUS DISEASE CONSULTATION   REASON FOR CONSULTATION: Sepsis.   HISTORY OF PRESENT ILLNESS: This is a pleasant, 59 year old gentleman with a history of coronary artery disease status post CABG, as well as type 2 diabetes with peripheral neuropathy. He was admitted on 07/19/2014 with chest pain and shortness of breath. He was having increasing dyspnea on exertion. He was also having some cough, which was nonproductive, and increasing lower extremity edema. Upon arrival, he was noted to be in SVT and to also have a fever and elevated white count. Since admission, he has grown Group B Streptococcus from 2 blood cultures. We were consulted for further antibiotic management.   Of note, the patient has had increasing lower extremity swelling. He has not had any lesions, ulcers, or sores on his lower extremities; however, he has had some redness on the left leg.   PAST MEDICAL HISTORY:  1.  Coronary artery disease, status post CABG.  2.  Diabetes.  3.  Hypertension.  4.  Morbid obesity.  5.  Hyperlipidemia.  6.  GERD. 7.  Peripheral neuropathy.  8.  Prior history of supraventricular tachycardia.   SOCIAL HISTORY: He lives with his wife. No tobacco, alcohol or drugs.   FAMILY HISTORY: Noncontributory.   ALLERGIES: No known drug allergies.   REVIEW OF SYSTEMS: Eleven systems reviewed and negative, except as per HPI.   ANTIBIOTICS SINCE ADMISSION INCLUDE: Vancomycin and Zosyn.   PHYSICAL EXAMINATION:  VITAL SIGNS: Temperature 98.7, pulse 113, blood pressure 147/97, respirations 18, saturation 97% on 2 L.  GENERAL: He is morbidly obese, in some respiratory distress.  HEENT: Pupils reactive. Sclerae anicteric. Oropharynx clear.  NECK: Supple.  HEART: Tachycardic.  LUNGS: Coarse breath  sounds in bilateral bases.  ABDOMEN: Obese, soft, nontender.  EXTREMITIES: He has 2+ edema of bilateral lower extremity, worse on the left. His left leg also has erythema.  NEUROLOGIC: He is alert and oriented x 3. Grossly nonfocal neuro exam.   LABORATORY DATA: Blood cultures from 07/19/2014 show 2:2  growing Group B Strep. White blood count on admission was 16.4, down to 11.1; hemoglobin 11.5, platelets 142,000. Renal function shows a creatinine of 1.12. LFTs were not done. Repeat chest x-ray on 07/21/2014 showed interim clearing of the left lung base opacity. CT of the chest negative for PE; there was worsening anterior left upper lobe atelectasis and healing rib fractures.   IMPRESSION: A 59 year old gentleman who fell and fractured some ribs several months ago, admitted with chest pain, shortness of breath, fever, and elevated white count. He was found to be in supraventricular tachycardia. He has Group B Streptococcus bacteremia. Likely source is the left lower extremity cellulitis. He also appears to have pneumonia. He has had a transesophageal echocardiogram, which shows a bicuspid aortic valve, but no evidence of vegetation.   I doubt this is endocarditis, as Group B Strep endocarditis is relatively unusual. It is much more likely due to the cellulitis in his left lower extremity with Group B Streptococcus being the leading cause in diabetic patients.   RECOMMENDATIONS:  1.  Discontinue vancomycin.  2.  Start ceftriaxone 2 g q. 24 hours.  3.  Repeat blood cultures to ensure clearance.  4.  He will likely need 2-3 more days of IV antibiotics and then can be transitioned  to oral antibiotics with high-dose amoxicillin.  5.  Elevate the lower extremity to help with the edema.   Thank you for the consult. I will be glad to follow with you.    ____________________________ Cheral Marker. Ola Spurr, MD dpf:MT D: 07/22/2014 08:20:00 ET T: 07/22/2014 09:21:46 ET JOB#: [id number]  cc: Cheral Marker.  Ola Spurr, MD, <Dictator> DAVID Ola Spurr MD ELECTRONICALLY SIGNED 07/26/2014 20:53

## 2014-10-15 NOTE — Consult Note (Signed)
Chief Complaint:  Subjective/Chief Complaint Patient feels better improve shortness of breath no chest pain no palpitations or tachycardia no fever chills or sweats ready to go   VITAL SIGNS/ANCILLARY NOTES: **Vital Signs.:   10-Feb-16 11:08  Vital Signs Type Routine  Temperature Temperature (F) 98.2  Celsius 36.7  Temperature Source oral  Pulse Pulse 67  Respirations Respirations 20  Systolic BP Systolic BP 937  Diastolic BP (mmHg) Diastolic BP (mmHg) 78  Mean BP 104  Pulse Ox % Pulse Ox % 95  Pulse Ox Activity Level  At rest  Oxygen Delivery 3L  *Intake and Output.:   Daily 10-Feb-16 07:00  Grand Totals Intake:  480 Output:  2600    Net:  -2120 24 Hr.:  -2120  Oral Intake      In:  480  Urine ml     Out:  2600  Length of Stay Totals Intake:  12487.3 Output:  90240    Net:  -5182.7   Brief Assessment:  (Data referenced from "Consultant's Daily Progress Note" 09-Feb-16 11:43)  GEN well developed, well nourished, no acute distress, obese   Cardiac Regular  no murmur   Respiratory normal resp effort  rhonchi   Gastrointestinal Normal   EXTR negative edema   Lab Results: Lab:  10-Feb-16 05:00   O2 Saturation (Pulse Ox) 91  FiO2 (Pulse Ox) ra (Result(s) reported on 26 Jul 2014 at 05:04AM.)  Routine Chem:  10-Feb-16 03:01   Glucose, Serum  175  BUN  30  Creatinine (comp) 1.08  Sodium, Serum 141  Potassium, Serum 3.9  Chloride, Serum 106  CO2, Serum 27  Calcium (Total), Serum  8.4  Anion Gap 8  Osmolality (calc) 292  eGFR (African American) >60  eGFR (Non-African American) >60 (eGFR values <33mL/min/1.73 m2 may be an indication of chronic kidney disease (CKD). Calculated eGFR, using the MRDR Study equation, is useful in  patients with stable renal function. The eGFR calculation will not be reliable in acutely ill patients when serum creatinine is changing rapidly. It is not useful in patients on dialysis. The eGFR calculation may not be applicable to  patients at the low and high extremes of body sizes, pregnant women, and vegetarians.)  Routine Hem:  10-Feb-16 03:01   WBC (CBC) 9.4  RBC (CBC)  3.89  Hemoglobin (CBC)  10.6  Hematocrit (CBC)  33.1  Platelet Count (CBC) 176  MCV 85  MCH 27.3  MCHC 32.1  RDW  14.8  Neutrophil % 76.6  Lymphocyte % 11.9  Monocyte % 8.7  Eosinophil % 2.3  Basophil % 0.5  Neutrophil #  7.2  Lymphocyte # 1.1  Monocyte # 0.8  Eosinophil # 0.2  Basophil # 0.0 (Result(s) reported on 26 Jul 2014 at 03:30AM.)   Radiology Results: XRay:    03-Feb-16 20:31, Chest Portable Single View  Chest Portable Single View   REASON FOR EXAM:    chest pain  COMMENTS:       PROCEDURE: DXR - DXR PORTABLE CHEST SINGLE VIEW  - Jul 19 2014  8:31PM     CLINICAL DATA:  59 year old male with chest pain and recent  diagnosis of pneumonia    EXAM:  PORTABLE CHEST - 1 VIEW    COMPARISON:  Prior chest x-ray 06/30/2014 ; CT scan of the chest  performed earlier today.    FINDINGS:  Stable cardiac and mediastinal contours. There is mild cardiomegaly.  Patient is status post median sternotomy with evidence of prior  multivessel  CABG.    Linear opacity in the left mid lung consistent with atelectasis as  seen on the recent CT scan. Additionally, there is faint linear  atelectasis versus scarring along the right minor fissure. No  pleural effusion or pneumothorax. No pulmonary edema. No acute  osseous abnormality.     IMPRESSION:  Stable chest x-ray with linear atelectasis versus scarring in the  anterior left upper lobe and along the right minor fissure.    Stable cardiomegaly.  Electronically Signed    By: Jacqulynn Cadet M.D.    On: 07/19/2014 20:54         Verified By: Criselda Peaches, M.D.,    05-Feb-16 07:29, Chest Portable Single View  Chest Portable Single View   REASON FOR EXAM:    sepsis  COMMENTS:       PROCEDURE: DXR - DXR PORTABLE CHEST SINGLE VIEW  - Jul 21 2014  7:29AM     CLINICAL  DATA:  Sepsis.  AFib.  Shortness of breath.    EXAM:  PORTABLE CHEST - 1 VIEW    COMPARISON:  None.    FINDINGS:  Mediastinum and hilar structures normal. Cardiomegaly. Prior CABG.  Normal pulmonary vascularity. Interim partial clearing of left mid  lung and right base infiltrates. No pleural effusion or  pneumothorax.     IMPRESSION:  1. Interim partial clearing of leftmid lung and right base  pulmonary infiltrates.    2. Stable cardiomegaly. Prior CABG. No pulmonary venous congestion.      Electronically Signed    By: Marcello Moores  Register    On: 07/21/2014 07:33       Verified By: Osa Craver, M.D., MD    07-Feb-16 08:19, Chest Portable Single View  Chest Portable Single View   REASON FOR EXAM:    SOB  COMMENTS:       PROCEDURE: DXR - DXR PORTABLE CHEST SINGLE VIEW  - Jul 23 2014  8:19AM     CLINICAL DATA:  Fall, left chest pain, history of pneumonia    EXAM:  PORTABLE CHEST - 1 VIEW    COMPARISON:  07/21/2014    FINDINGS:  Increasing right upper lobe opacity, suspicious for pneumonia.  Cardiomegaly with pulmonary vascular congestion. No frank  interstitial edema. No pneumothorax.    Postsurgical changes related to prior CABG.     IMPRESSION:  Increasing right upper lobe opacity, suspicious for pneumonia.      Electronically Signed    By: Julian Hy M.D.    On: 07/23/2014 10:19         Verified By: Julian Hy, M.D.,    09-Feb-16 08:05, Chest PA and Lateral  Chest PA and Lateral   REASON FOR EXAM:    SOB  COMMENTS:       PROCEDURE: DXR - DXR CHEST PA (OR AP) AND LATERAL  - Jul 25 2014  8:05AM     CLINICAL DATA:  59 year old male with shortness of breath    EXAM:  CHEST  2 VIEW    COMPARISON:  Prior chest x-ray 07/23/2014    FINDINGS:  Stable cardiomegaly and mediastinal contours. Patient is status post  median sternotomy with evidence of prior multivessel CABG. Interval  progression of diffuse bilateral interstitial and  airspace  opacities. The appearance is most suggestive of a pulmonary edema.  The underlying right upper lobe opacity seen on the prior radiograph  is now largely obscured by the superimposed edema. Small bilateral  pleural  effusions and associated bibasilar atelectasis. No  pneumothorax.     IMPRESSION:  1. Interval development of pulmonary edema suggesting either CHF or  volume overload.  2. Small bilateral layering pleural effusions.  3. Previously noted increasing right upper lobe opacities in a  largely obscured by the superimposed edema.    Electronically Signed    By: Jacqulynn Cadet M.D.    On: 07/25/2014 08:11         Verified By: Criselda Peaches, M.D.,  Cardiology:    03-Feb-16 19:50, ED ECG  Ventricular Rate 154  Atrial Rate 156  QRS Duration 92  QT 286  QTc 458  R Axis -16  T Axis 130  ECG interpretation   Supraventricular tachycardia  Incomplete right bundle branch block  Left ventricular hypertrophy with repolarization abnormality  Abnormal ECG  When compared with ECG of 02-Jul-2014 08:54,  Borderline criteria for Inferior infarct are no longer Present  ST now depressed in Anterior leads  ----------unconfirmed----------  Confirmed by OVERREAD, NOT ([id number]), editor PEARSON, BARBARA ([id number]) on 07/20/2014 9:46:43 AM  ED ECG     03-Feb-16 20:14, ECG  Ventricular Rate 155  Atrial Rate 163  QRS Duration 94  QT 312  QTc 501  R Axis -16  T Axis 125  ECG interpretation   Supraventricular tachycardia  Incomplete right bundle branch block  Left ventricular hypertrophy with repolarization abnormality  Nonspecific ST abnormality  Abnormal ECG  When compared with ECG of 02-Jul-2014 08:54,  Borderline criteria for Inferiorinfarct are no longer Present  ----------unconfirmed----------  Confirmed by OVERREAD, NOT ([id number]), editor PEARSON, BARBARA ([id number]) on 07/20/2014 9:46:51 AM  ECG     03-Feb-16 20:18, ECG  Ventricular Rate 120  Atrial Rate 312  QRS Duration 108   QT 288  QTc 407  R Axis -12  T Axis 126  ECG interpretation   Atrial flutter with variable A-V block  Incomplete right bundle branch block  Left ventricular hypertrophy with repolarization abnormality  Abnormal ECG  When compared with ECG of 19-Jul-2014 20:17,  No significant change was found  ----------unconfirmed----------  Confirmed by OVERREAD, NOT ([id number]), editor PEARSON, BARBARA ([id number]) on 07/20/2014 9:46:55 AM  ECG     04-Feb-16 12:54, Echo Doppler  Echo Doppler   REASON FOR EXAM:      COMMENTS:       PROCEDURE: [facility] - ECHO DOPPLER COMPLETE(TRANSTHOR)  - Jul 20 2014 12:54PM     RESULT: Echocardiogram Report    Patient Name:   AMORI COLOMB Date of Exam: 07/20/2014  Medical Rec #:  [id number]       Custom1:  Date of Birth:  07-21-1955    Height:       69.0 in  Patient Age:    59 years     Weight:       307.0 lb  Patient Gender: M            BSA:          2.48 m??    Indications: MI  Sonographer:    Sherrie Sport RDCS  Referring Phys: Doy Hutching, JEFFREY, D    Sonographer Comments: Technically difficult study due to poor echo   windows and suboptimal apical window.    Summary:   1. Left ventricular ejection fraction, by visual estimation, is 45 to   50%.   2. Mildly decreased global left ventricular systolic function.   3. Impaired relaxation pattern of LV diastolic filling.   4. Moderate concentric left  ventricular hypertrophy.   5. Moderately increased left ventricular septal thickness.   6. Mildly dilated left atrium.   7. Mildly dilated right atrium.   8. Mild mitral valve regurgitation.   9. Mild to moderate tricuspid regurgitation.  10. Moderately increased left ventricular posterior wall thickness.  2D AND M-MODE MEASUREMENTS (normal ranges within parentheses):  Left Ventricle:          Normal  IVSd (2D):    1.88 cm (0.7-1.1)  LVPWd (2D):     1.67 cm (0.7-1.1) Aorta/LA:                  Normal  LVIDd (2D):     4.77 cm (3.4-5.7) Aortic Root (2D): 3.10 cm (2.4-3.7)  LVIDs  (2D):     3.46 cm           Left Atrium (2D): 4.90 cm (1.9-4.0)  LV FS (2D):     27.5 %   (>25%)  LV EF (2D):     53.3 %   (>50%)                                    Right Ventricle:                                    RVd (2D):        7.06 cm  LV DIASTOLIC FUNCTION:  MV Peak E: 1.29 m/s E/e' Ratio: 9.80                      Decel Time: 158 msec  SPECTRAL DOPPLER ANALYSIS (where applicable):  Mitral Valve:  MV P1/2 Time: 45.82 msec  MV Area, PHT: 4.80 cm??  Aortic Valve: AoV Max Vel: 2.27 m/s AoV Peak PG: 20.6 mmHg AoV Mean PG:   12.7 mmHg  LVOT Vmax: 0.81 m/s LVOT VTI: 0.151 m LVOT Diameter: 2.10 cm  AoV Area, Vmax: 1.24 cm?? AoV Area, VTI: 1.62 cm?? AoV Area, Vmn: 1.25 cm??  Tricuspid Valve and PA/RV Systolic Pressure: TR Max Velocity: 1.95 m/s RA   Pressure: 5 mmHg RVSP/PASP: 20.2 mmHg  Pulmonic Valve:  PV Max Velocity: 0.81 m/s PV Max PG: 2.6 mmHg PV Mean PG:    PHYSICIAN INTERPRETATION:  Left Ventricle: Not well seen. The left ventricular internal cavity size     was normal. LV septal wall thickness was moderately increased. LV   posterior wall thickness was moderately increased. Moderate concentric   left ventricular hypertrophy. Global LV systolic function was mildly   decreased. Left ventricular ejection fraction, by visual estimation, is   45 to 50%. Spectral Doppler shows impaired relaxation pattern of LV   diastolic filling.  Right Ventricle: The right ventricular size is normal. Global RV systolic   function is low normal.  Left Atrium: The left atrium is mildly dilated.  Right Atrium: The right atrium is mildly dilated.  Pericardium: There is no evidenceof pericardial effusion.  Mitral Valve: The mitral valve is normal in structure. Mild mitral valve   regurgitation is seen.  Tricuspid Valve: The tricuspid valve is normal. Mild to moderate   tricuspid regurgitation is visualized. The tricuspid regurgitant velocity     is 1.95 m/s, and with an assumed right atrial  pressure of 5 mmHg, the   estimated right ventricular systolic pressure is normal at 20.2 mmHg.  Aortic Valve: The aortic valve is normal. No evidence of aortic valve   regurgitation isseen.  Pulmonic Valve: The pulmonic valve is normal. No indication of pulmonic   valve regurgitation.    Patrick MD  Electronically signed by [place] Lujean Amel MD  Signature Date/Time: 07/21/2014/8:03:35 AM    *** Final ***    IMPRESSION: .    Verified By: Yolonda Kida, M.D., MD    05-Feb-16 13:52, TEE  TEE   REASON FOR EXAM:      COMMENTS:       PROCEDURE: [facility] - ECHO TRANSESOPHAGEAL  - Jul 21 2014  1:52PM     RESULT: Transesophageal Echocardiogram Report    Patient Name:   MARKIE FRITH Date of Exam: 07/21/2014  Medical Rec #:  [id number]       Custom1:  Date of Birth:  [birth year]    Height:  Patient Age:    27 years     Weight:  Patient Gender: M            BSA:    Indications: Endocarditis  Sonographer:    Arville Go RDCS  Referring Phys: SPARKS, JEFFREY, D  SPECTRAL DOPPLER ANALYSIS (where applicable):    PHYSICIAN INTERPRETATION:  Left Ventricle: Left ventricular ejection fraction, by visual estimation,   is 60 to 65%.  Right Ventricle: Normal right ventricular size, wall thickness, and   systolic function. RV wall thickness is normal.  Left Atrium: The left atrium is mildly dilated.  Right Atrium: The right atrium is mildly dilated.  Pericardium: There is no evidence of pericardial effusion.  Mitral Valve: Mild mitral valve regurgitation is seen. No mitral   vegetation.  Tricuspid Valve: Mild tricuspid regurgitation is visualized.  Aortic Valve: The aortic valve is bicuspid with doming. Mild aortic valve     sclerosis is present, with no evidence of aortic valve stenosis. Trivial   aortic valve regurgitation is seen. Aaortic valve bicuspid with   thickineing and cannot rule out very small vegetaion but appears only to   be thickening.  Aorta: The aortic root  and ascending aorta are structurally normal, with   no evidence of dilitation.    Summary:   1. Left ventricular ejection fraction, by visual estimation, is 60 to   65%.   2. Mildly dilated left atrium.   3. Mildly dilated right atrium.   4. Mild mitral valve regurgitation.   5. Aortic valve is bicuspid with doming.   6. Mild aortic valve sclerosis without stenosis.   7. Mild tricuspid regurgitation.   8. Aaortic valve bicuspid with thickineing and cannot rule out very   small vegetaion but appears only to be thickening.   9. No mitral vegetation.  [place] MD  Electronically signed by [id number] Serafina Royals MD  Signature Date/Time: 07/21/2014/5:04:44 PM  *** Final ***    IMPRESSION: .        Verified By: Corey Skains  ([facility]), M.D., MD    08-Feb-16 14:10, ECG  Ventricular Rate 70  Atrial Rate 70  P-R Interval 222  QRS Duration 112  QT 478  QTc 516  P Axis 66  R Axis 29  T Axis 86  ECG interpretation   Sinus rhythm with 1st degree A-V block  Prolonged QT  Abnormal ECG  When compared with ECG of 19-Jul-2014 20:18,  Sinus rhythm has replaced Atrial flutter  Vent. rate has decreased BY  50 BPM  Incomplete right bundle branch  block is no longer Present  ST no longer depressed in Lateral leads  Confirmed by MORAYATI, SAM ([id number]) on 07/25/2014 3:42:11 PM    Overreader: Hipolito Bayley  ECG    Assessment/Plan:  Assessment/Plan:  Assessment atrial fibrillation cardioverted to normal sinus rhythm  COPD  bronchitis  obesity  obstructive sleep apnea  edema  cellulitis  bacteremia  hypoxemia .   Plan status post electrical cardioversion  continue inhalers  recommend long-term anticoagulation either with  Xarelto  recommend weight loss exercise portion control  continue antibiotics for cellulitis and bacteremia  wean oxygen  recommend weight loss exercise portion control  outpatient follow-up with pulmonary as an outpatient continue amiodarone 200 mg  a day for has antiarrhythmic  follow-up with primary cardiologist in 1-2 weeks  okay to discharges today and follow-up as needed   Electronic Signatures: Lujean Amel D (MD)  (Signed 10-Feb-16 14:23)  Authored: Chief Complaint, VITAL SIGNS/ANCILLARY NOTES, Brief Assessment, Lab Results, Radiology Results, Assessment/Plan   Last Updated: 10-Feb-16 14:23 by Yolonda Kida (MD)

## 2014-10-15 NOTE — Discharge Summary (Signed)
PATIENT NAME:  Marcus Williams, Marcus Williams MR#:  [id number] DATE OF BIRTH:  [birth year]  DATE OF ADMISSION:  06/30/2014 DATE OF DISCHARGE:  07/04/2014  DISCHARGE DIAGNOSES: 1.  Acute left upper lobe pneumonia, community-acquired.  2.  Non-ST-elevation myocardial infarction.  3.  Chronic atrial fibrillation. 4.  Coronary artery disease.  5.  Diabetes mellitus, insulin requiring, complicated by neuropathy.   DISCHARGE MEDICATIONS: Norco 5/325 t.i.d. p.r.n. pain, aspirin 325 mg daily, glimepiride 4 mg daily, furosemide 20 mg daily, Nitrostat 0.4 mg q. 5 minutes p.r.n., trazodone 100 mg at bedtime, metformin 1000 mg b.i.d., simvastatin 40 mg at bedtime, NovoLog Mix 70/30 insulin 70 a.m., 100 units at bedtime, Protonix 40 mg every a.m., Mirapex 0.5 mg at bedtime, Imdur 30 mg daily, metoprolol tartrate 50 mg t.i.d., diltiazem ER 120 mg b.i.d., Ceftin 500 mg b.i.d. x10 days.   REASON FOR ADMISSION: A 59 year old male who presents with chest pain. Please see H and P for HPI, past medical history, and physical exam.   HOSPITAL COURSE: The patient was admitted. Chest CT did not show any PE but did show left upper lobe airspace disease. He did rule in for MI with peak troponin of 1.1. He was treated with IV azithromycin and Rocephin, never requiring O2. His cough, congestion and wheezing improved over time and was discharged to home on Ceftin. With his MI and resulting atrial fibrillation, he had fibrates running up into the 140 range. Metoprolol tartrate was titrated up and later diltiazem added, finally controlling his heart rate in A-fib at a rate of 70. Discussions in the future as to whether he needs Eliquis above the aspirin or not. His LV systolic function is preserved. He underwent heart catheterization showing no new disease, medical management. Currently, he is asymptomatic and will be going home to follow up with Dr. Doy Hutching in 1 week. He will not drive until then. Follow-up about the chronic A-fib and his  pneumonia.  ____________________________ Rusty Aus, MD mfm:[id number] D: 07/04/2014 07:54:31 ET T: 07/04/2014 08:23:53 ET JOB#: [id number]  cc: Rusty Aus, MD, <Dictator> Leonie Douglas. Doy Hutching, MD Rusty Aus MD ELECTRONICALLY SIGNED 07/05/2014 8:21

## 2014-10-15 NOTE — Consult Note (Signed)
Chief Complaint:  Subjective/Chief Complaint Improve shortness of breath the per reading is better no chest pain sitting up in a chair feels better the digestive the   VITAL SIGNS/ANCILLARY NOTES: **Vital Signs.:   07-Feb-16 08:00  Vital Signs Type Routine  Temperature Temperature (F) 98.4  Celsius 36.8  Temperature Source oral  Pulse Pulse 95  Respirations Respirations 22  Systolic BP Systolic BP 222  Diastolic BP (mmHg) Diastolic BP (mmHg) 979  Mean BP 123  Pulse Ox % Pulse Ox % 94  Pulse Ox Activity Level  At rest  Oxygen Delivery 2L; Nasal Cannula  *Intake and Output.:   07-Feb-16 08:00  Grand Totals Intake:  101.6 Output:      Net:  101.6 24 Hr.:  203.2  Cardizem      In:  10  IV (Primary)      In:  16.6  IV (Primary)      In:  75   Brief Assessment:  GEN well developed, well nourished, no acute distress   Cardiac Irregular  murmur present  + LE edema   Respiratory normal resp effort  clear BS  rhonchi   Gastrointestinal Normal   Gastrointestinal details normal Soft   EXTR negative edema   Lab Results: Hepatic:  07-Feb-16 04:36   Bilirubin, Total 0.9  Alkaline Phosphatase 63  SGPT (ALT) 44  SGOT (AST) 33  Total Protein, Serum 7.1  Albumin, Serum  2.7  Routine Chem:  07-Feb-16 04:36   Glucose, Serum  203  BUN 18  Creatinine (comp) 1.13  Sodium, Serum 136  Potassium, Serum 4.1  Chloride, Serum 103  CO2, Serum 25  Calcium (Total), Serum  8.2  Anion Gap 8  Osmolality (calc) 280  eGFR (African American) >60  eGFR (Non-African American) >60 (eGFR values <63mL/min/1.73 m2 may be an indication of chronic kidney disease (CKD). Calculated eGFR, using the MRDR Study equation, is useful in  patients with stable renal function. The eGFR calculation will not be reliable in acutely ill patients when serum creatinine is changing rapidly. It is not useful in patients on dialysis. The eGFR calculation may not be applicable to patients at the low and high  extremes of body sizes, pregnant women, and vegetarians.)  Magnesium, Serum 2.1 (1.8-2.4 THERAPEUTIC RANGE: 4-7 mg/dL TOXIC: > 10 mg/dL  -----------------------)  Routine Hem:  07-Feb-16 04:36   WBC (CBC) 7.8  RBC (CBC)  3.85  Hemoglobin (CBC)  10.7  Hematocrit (CBC)  32.6  Platelet Count (CBC)  139  MCV 85  MCH 27.8  MCHC 32.8  RDW  14.8  Neutrophil % 75.1  Lymphocyte % 13.8  Monocyte % 8.7  Eosinophil % 1.5  Basophil % 0.9  Neutrophil # 5.9  Lymphocyte # 1.1  Monocyte # 0.7  Eosinophil # 0.1  Basophil # 0.1 (Result(s) reported on 23 Jul 2014 at 05:16AM.)   Radiology Results: XRay:    03-Feb-16 20:31, Chest Portable Single View  Chest Portable Single View   REASON FOR EXAM:    chest pain  COMMENTS:       PROCEDURE: DXR - DXR PORTABLE CHEST SINGLE VIEW  - Jul 19 2014  8:31PM     CLINICAL DATA:  59 year old male with chest pain and recent  diagnosis of pneumonia    EXAM:  PORTABLE CHEST - 1 VIEW    COMPARISON:  Prior chest x-ray 06/30/2014 ; CT scan of the chest  performed earlier today.    FINDINGS:  Stable cardiac and mediastinal  contours. There is mild cardiomegaly.  Patient is status post median sternotomy with evidence of prior  multivessel CABG.    Linear opacity in the left mid lung consistent with atelectasis as  seen on the recent CT scan. Additionally, there is faint linear  atelectasis versus scarring along the right minor fissure. No  pleural effusion or pneumothorax. No pulmonary edema. No acute  osseous abnormality.     IMPRESSION:  Stable chest x-ray with linear atelectasis versus scarring in the  anterior left upper lobe and along the right minor fissure.    Stable cardiomegaly.  Electronically Signed    By: Jacqulynn Cadet M.D.    On: 07/19/2014 20:54         Verified By: Criselda Peaches, M.D.,    05-Feb-16 07:29, Chest Portable Single View  Chest Portable Single View   REASON FOR EXAM:    sepsis  COMMENTS:        PROCEDURE: DXR - DXR PORTABLE CHEST SINGLE VIEW  - Jul 21 2014  7:29AM     CLINICAL DATA:  Sepsis.  AFib.  Shortness of breath.    EXAM:  PORTABLE CHEST - 1 VIEW    COMPARISON:  None.    FINDINGS:  Mediastinum and hilar structures normal. Cardiomegaly. Prior CABG.  Normal pulmonary vascularity. Interim partial clearing of left mid  lung and right base infiltrates. No pleural effusion or  pneumothorax.     IMPRESSION:  1. Interim partial clearing of leftmid lung and right base  pulmonary infiltrates.    2. Stable cardiomegaly. Prior CABG. No pulmonary venous congestion.      Electronically Signed    By: Marcello Moores  Register    On: 07/21/2014 07:33       Verified By: Osa Craver, M.D., MD    07-Feb-16 08:19, Chest Portable Single View  Chest Portable Single View   REASON FOR EXAM:    SOB  COMMENTS:       PROCEDURE: DXR - DXR PORTABLE CHEST SINGLE VIEW  - Jul 23 2014  8:19AM     CLINICAL DATA:  Fall, left chest pain, history of pneumonia    EXAM:  PORTABLE CHEST - 1 VIEW    COMPARISON:  07/21/2014    FINDINGS:  Increasing right upper lobe opacity, suspicious for pneumonia.  Cardiomegaly with pulmonary vascular congestion. No frank  interstitial edema. No pneumothorax.    Postsurgical changes related to prior CABG.     IMPRESSION:  Increasing right upper lobe opacity, suspicious for pneumonia.      Electronically Signed    By: Julian Hy M.D.    On: 07/23/2014 10:19         Verified By: Julian Hy, M.D.,  Cardiology:    03-Feb-16 19:50, ED ECG  Ventricular Rate 154  Atrial Rate 156  QRS Duration 92  QT 286  QTc 458  R Axis -16  T Axis 130  ECG interpretation   Supraventricular tachycardia  Incomplete right bundle branch block  Left ventricular hypertrophy with repolarization abnormality  Abnormal ECG  When compared with ECG of 02-Jul-2014 08:54,  Borderline criteria for Inferior infarct are no longer Present  ST now  depressed in Anterior leads  ----------unconfirmed----------  Confirmed by OVERREAD, NOT (100), editor PEARSON, BARBARA ([id number]) on 07/20/2014 9:46:43 AM  ED ECG     03-Feb-16 20:14, ECG  Ventricular Rate 155  Atrial Rate 163  QRS Duration 94  QT 312  QTc 501  R Axis -  16  T Axis 125  ECG interpretation   Supraventricular tachycardia  Incomplete right bundle branch block  Left ventricular hypertrophy with repolarization abnormality  Nonspecific ST abnormality  Abnormal ECG  When compared with ECG of 02-Jul-2014 08:54,  Borderline criteria for Inferiorinfarct are no longer Present  ----------unconfirmed----------  Confirmed by OVERREAD, NOT (100), editor PEARSON, BARBARA (32) on 07/20/2014 9:46:51 AM  ECG     03-Feb-16 20:18, ECG  Ventricular Rate 120  Atrial Rate 312  QRS Duration 108  QT 288  QTc 407  R Axis -12  T Axis 126  ECG interpretation   Atrial flutter with variable A-V block  Incomplete right bundle branch block  Left ventricular hypertrophy with repolarization abnormality  Abnormal ECG  When compared with ECG of 19-Jul-2014 20:17,  No significant change was found  ----------unconfirmed----------  Confirmed by OVERREAD, NOT (100), editor PEARSON, BARBARA (32) on 07/20/2014 9:46:55 AM  ECG     04-Feb-16 12:54, Echo Doppler  Echo Doppler   REASON FOR EXAM:      COMMENTS:       PROCEDURE: [facility] - ECHO DOPPLER COMPLETE(TRANSTHOR)  - Jul 20 2014 12:54PM     RESULT: Echocardiogram Report    Patient Name:   THURMON MIZELL Date of Exam: 07/20/2014  Medical Rec #:  [id number]       Custom1:  Date of Birth:  1955/12/26    Height:       69.0 in  Patient Age:    58 years     Weight:       307.0 lb  Patient Gender: M            BSA:          2.48 m??    Indications: MI  Sonographer:    Cristela Blue RDCS  Referring Phys: Judithann Sheen, JEFFREY, D    Sonographer Comments: Technically difficult study due to poor echo   windows and suboptimal apical window.    Summary:   1. Left ventricular  ejection fraction, by visual estimation, is 45 to   50%.   2. Mildly decreased global left ventricular systolic function.   3. Impaired relaxation pattern of LV diastolic filling.   4. Moderate concentric left ventricular hypertrophy.   5. Moderately increased left ventricular septal thickness.   6. Mildly dilated left atrium.   7. Mildly dilated right atrium.   8. Mild mitral valve regurgitation.   9. Mild to moderate tricuspid regurgitation.  10. Moderately increased left ventricular posterior wall thickness.  2D AND M-MODE MEASUREMENTS (normal ranges within parentheses):  Left Ventricle:          Normal  IVSd (2D):    1.88 cm (0.7-1.1)  LVPWd (2D):     1.67 cm (0.7-1.1) Aorta/LA:                  Normal  LVIDd (2D):     4.77 cm (3.4-5.7) Aortic Root (2D): 3.10 cm (2.4-3.7)  LVIDs (2D):     3.46 cm           Left Atrium (2D): 4.90 cm (1.9-4.0)  LV FS (2D):     27.5 %   (>25%)  LV EF (2D):     53.3 %   (>50%)                                    Right Ventricle:  RVd (2D):        5.46 cm  LV DIASTOLIC FUNCTION:  MV Peak E: 1.29 m/s E/e' Ratio: 9.80                      Decel Time: 158 msec  SPECTRAL DOPPLER ANALYSIS (where applicable):  Mitral Valve:  MV P1/2 Time: 45.82 msec  MV Area, PHT: 4.80 cm??  Aortic Valve: AoV Max Vel: 2.27 m/s AoV Peak PG: 20.6 mmHg AoV Mean PG:   12.7 mmHg  LVOT Vmax: 0.81 m/s LVOT VTI: 0.151 m LVOT Diameter: 2.10 cm  AoV Area, Vmax: 1.24 cm?? AoV Area, VTI: 1.62 cm?? AoV Area, Vmn: 1.25 cm??  Tricuspid Valve and PA/RV Systolic Pressure: TR Max Velocity: 1.95 m/s RA   Pressure: 5 mmHg RVSP/PASP: 20.2 mmHg  Pulmonic Valve:  PV Max Velocity: 0.81 m/s PV Max PG: 2.6 mmHg PV Mean PG:    PHYSICIAN INTERPRETATION:  Left Ventricle: Not well seen. The left ventricular internal cavity size     was normal. LV septal wall thickness was moderately increased. LV   posterior wall thickness was moderately increased. Moderate  concentric   left ventricular hypertrophy. Global LV systolic function was mildly   decreased. Left ventricular ejection fraction, by visual estimation, is   45 to 50%. Spectral Doppler shows impaired relaxation pattern of LV   diastolic filling.  Right Ventricle: The right ventricular size is normal. Global RV systolic   function is low normal.  Left Atrium: The left atrium is mildly dilated.  Right Atrium: The right atrium is mildly dilated.  Pericardium: There is no evidenceof pericardial effusion.  Mitral Valve: The mitral valve is normal in structure. Mild mitral valve   regurgitation is seen.  Tricuspid Valve: The tricuspid valve is normal. Mild to moderate   tricuspid regurgitation is visualized. The tricuspid regurgitant velocity     is 1.95 m/s, and with an assumed right atrial pressure of 5 mmHg, the   estimated right ventricular systolic pressure is normal at 20.2 mmHg.  Aortic Valve: The aortic valve is normal. No evidence of aortic valve   regurgitation isseen.  Pulmonic Valve: The pulmonic valve is normal. No indication of pulmonic   valve regurgitation.    Coleman MD  Electronically signed by Christian Lujean Amel MD  Signature Date/Time: 07/21/2014/8:03:35 AM    *** Final ***    IMPRESSION: .    Verified By: Yolonda Kida, M.D., MD    05-Feb-16 13:52, TEE  TEE   REASON FOR EXAM:      COMMENTS:       PROCEDURE: [facility] - ECHO TRANSESOPHAGEAL  - Jul 21 2014  1:52PM     RESULT: Transesophageal Echocardiogram Report    Patient Name:   KEONTRE DEFINO Date of Exam: 07/21/2014  Medical Rec #:  [id number]       Custom1:  Date of Birth:  [birth year]    Height:  Patient Age:    41 years     Weight:  Patient Gender: M            BSA:    Indications: Endocarditis  Sonographer:    Arville Go RDCS  Referring Phys: SPARKS, JEFFREY, D  SPECTRAL DOPPLER ANALYSIS (where applicable):    PHYSICIAN INTERPRETATION:  Left Ventricle: Left ventricular ejection  fraction, by visual estimation,   is 60 to 65%.  Right Ventricle: Normal right ventricular size, wall thickness, and   systolic function. RV wall  thickness is normal.  Left Atrium: The left atrium is mildly dilated.  Right Atrium: The right atrium is mildly dilated.  Pericardium: There is no evidence of pericardial effusion.  Mitral Valve: Mild mitral valve regurgitation is seen. No mitral   vegetation.  Tricuspid Valve: Mild tricuspid regurgitation is visualized.  Aortic Valve: The aortic valve is bicuspid with doming. Mild aortic valve     sclerosis is present, with no evidence of aortic valve stenosis. Trivial   aortic valve regurgitation is seen. Aaortic valve bicuspid with   thickineing and cannot rule out very small vegetaion but appears only to   be thickening.  Aorta: The aortic root and ascending aorta are structurally normal, with   no evidence of dilitation.    Summary:   1. Left ventricular ejection fraction, by visual estimation, is 60 to   65%.   2. Mildly dilated left atrium.   3. Mildly dilated right atrium.   4. Mild mitral valve regurgitation.   5. Aortic valve is bicuspid with doming.   6. Mild aortic valve sclerosis without stenosis.   7. Mild tricuspid regurgitation.   8. Aaortic valve bicuspid with thickineing and cannot rule out very   small vegetaion but appears only to be thickening.   9. No mitral vegetation.  [id number] Serafina Royals MD  Electronically signed by [id number] Serafina Royals MD  Signature Date/Time: 07/21/2014/5:04:44 PM  *** Final ***    IMPRESSION: .        Verified By: Corey Skains  ([facility]), M.D., MD   Assessment/Plan:  Assessment/Plan:  Assessment shortness of breath atrial fibrillation  obesity  respiratory failure  bacteremia/ cellulitis  COPD  status post TEE  hypertension  obstructive sleep apnea .   Plan consider cardioversion electrically continue amiodarone load  agree with IV Cardizem therapy for rate control   continue anticoagulation  recommend add digoxin for rate control  continue low-dose beta-blockers  agree with broad-spectrum antibiotics for bacteremia  consider cardioversion if not better in 24-48 hours  continue inhalers and treatment for bronchitis  consider pulmonary improve for history failure  obstructive sleep apnea therapy  transferred to telemetry   Electronic Signatures: Lujean Amel D (MD)  (Signed 08-Feb-16 08:25)  Authored: Chief Complaint, VITAL SIGNS/ANCILLARY NOTES, Brief Assessment, Lab Results, Radiology Results, Assessment/Plan   Last Updated: 08-Feb-16 08:25 by Lujean Amel D (MD)

## 2014-10-15 NOTE — H&P (Signed)
PATIENT NAME:  Marcus Williams, Marcus Williams MR#:  [id number] DATE OF BIRTH:  [birth year]  DATE OF ADMISSION:  07/19/2014  REFERRING PHYSICIAN: Brunilda Payor A. Edd Fabian, MD   PRIMARY CARE PHYSICIAN: Fulton Reek, MD, [facility].   CARDIOLOGY: Javier Docker. Fath, MD   CHIEF COMPLAINT: Chest pain, shortness of breath.   HISTORY OF PRESENT ILLNESS: A 59 year old Caucasian gentleman with history of coronary artery disease, status post CABG, type 2 diabetes insulin-requiring, complicated by peripheral neuropathy, presented with chest pain and shortness of breath described as acute onset of retrosternal chest pain which occurred at rest, pressure in quality, 8 to 9 out of 10 in intensity, no worsening  or relieving factors,  associated shortness of breath as well as diaphoretic. Also describes having shortness of breath 1-2 week total duration, shortness of breath, mainly dyspnea on exertion with associated cough, nonproductive, and worsening lower extremity edema from baseline. He presented to the hospital for the above symptoms. Upon arrival to the Emergency Department, noted to be in what appeared to be SVT, heart rate markedly elevated. After some initial IV Cardizem though, heart rate slowing, appears to be alternating between atrial flutter and atrial fibrillation.   REVIEW OF SYSTEMS: CONSTITUTIONAL: Denies fevers. Positive for subjective chills, fatigue, weakness.  EYES: Denies blurred vision, double vision, eye pain. EARS, NOSE, AND THROAT: Denies ear pain, hearing loss. RESPIRATORY: Positive for cough, shortness of breath as stated above. CARDIOVASCULAR: Positive for chest pain, palpitations, edema as stated above.  GASTROINTESTINAL: Denies nausea, vomiting, diarrhea, or abdominal pain.  GENITOURINARY: Denies dysuria, hematuria.  ENDOCRINE: Denies nocturia or thyroid problems.  HEMATOLOGY AND LYMPHATIC: Denies easy bruising or bleeding.  SKIN: Denies rashes or lesions.  MUSCULOSKELETAL: Denies pain in neck,  back, shoulder, knees, hips or arthritic symptoms.  NEUROLOGIC: Denies paralysis, paresthesias. PSYCHIATRIC: Denies anxiety or depressive symptoms.  Otherwise, full review of systems performed by me is negative.   PAST MEDICAL HISTORY: Hyperlipidemia, unspecified; gastroesophageal reflux disease without esophagitis; coronary artery disease, status post CABG; type 2 diabetes insulin-requiring, complicated by peripheral neuropathy; hypertension, essential; history of SVT.   SOCIAL HISTORY: No alcohol, tobacco, or drug usage.  FAMILY HISTORY: Positive for diabetes, as well as coronary artery disease.   ALLERGIES: No known drug allergies.   HOME MEDICATIONS: Acetaminophen/hydrocodone 325/5 mg p.o. q. 4 hours as needed for pain, aspirin 325 p.o. daily, Imdur 30 mg p.o. daily, nitroglycerin 0.4 mg sublingual every 5 minutes as needed for chest pain, diltiazem 120 mg p.o. b.i.d., Lyrica 75 mg 1 capsule in the morning and 2 capsules in the evening, trazodone 100 mg p.o. at bedtime, glimepiride 4 mg p.o. daily, insulin 70/30 at 100 units at bedtime and 70 units in the morning, metformin 1000 mg p.o. b.i.d., simvastatin 40 mg p.o. daily, pramipexole 0.5 mg p.o. at bedtime, metoprolol 50 mg p.o. q. 8 hours, Lasix 20 mg p.o. daily, pantoprazole 40 mg p.o. daily.   PHYSICAL EXAMINATION:  VITAL SIGNS: Temperature 100.7. Heart rate maximum 157, currently 135. Respirations 30, blood pressure 169/83, saturating 99% on supplemental O2. Weight 136.5 kg, BMI of 44.5.  GENERAL: Critically ill-appearing Caucasian gentleman, currently in moderate distress given respiratory status with inability to speak in full sentences.  HEAD: Normocephalic, atraumatic.  EYES: Pupils equal, round, reactive to light. Extraocular muscles intact. No scleral icterus.  MOUTH: Moist mucosal membranes. Dentition intact. No abscess noted.  EARS, NOSE, AND THROAT: Clear without exudates. No external lesions.  NECK: Supple. No thyromegaly.  No nodules. No JVD.  PULMONARY: Diffuse  expiratory wheezing throughout all fields, somewhat more prominent in the left upper. Tachypneic without use of accessory muscles. Unable to speak in full sentences.  CHEST: Nontender to palpation.  CARDIOVASCULAR: S1, S2. Irregular rate, irregular rhythm. No murmurs, rubs, or gallops. Two plus  edema to the knees bilaterally. Pedal pulses 2+ bilaterally. GASTROINTESTINAL: Soft, nontender, nondistended. No masses. Positive bowel sounds. No hepatosplenomegaly.  MUSCULOSKELETAL: No swelling, clubbing, or edema. Range of motion full in all extremities.  NEUROLOGIC: Cranial nerves II-XII intact. No gross focal neurological deficits. Sensation intact. Reflexes intact.  SKIN: No ulceration, lesions, rashes, or cyanosis. Skin warm, dry. Turgor intact.  PSYCHIATRIC: Mood and affect within normal limits. The patient is awake, alert, oriented x 3. Insight and judgment intact.   LABORATORY DATA: Initial EKG performed which reveals narrow complex sinus tachycardia, heart rate in the 150s. After receiving IV diltiazem, heart rate slowed down revealing atrial flutter, heart rate in the 120s. Currently on telemetry appears to be in atrial fibrillation, heart rate 130s.  CT chest performed, which reveals negative for PE, however, worsening anterior left upper lobe atelectasis, healing left rib fractures.   Chest x-ray performed reveals stable chest x-ray with linear atelectasis versus scarring, left upper lobe.   Remainder of laboratory data: Sodium 140, potassium 3.6, chloride 103, bicarbonate 29, BUN 23, creatinine 1.04, glucose of 171. BNP 1456. Troponin 0.66. CK-MB of 7.3. WBC of 16.4, hemoglobin 13.2, platelets of 204,000.   ASSESSMENT AND PLAN: A 59 year old Caucasian gentleman with a history of coronary artery disease, status post CABG, type 2 diabetes insulin-dependent, complicated by peripheral neuropathy, presenting with acute onset of chest pain as well as  shortness of breath which has been gradually worsening. 1.  Atrial fibrillation/atrial flutter with rapid ventricular response. Currently on Cardizem drip. We will initiate Cardizem bolus 10 mg IV now, increase drip to 15. Initiate Lovenox 1 mg/kg b.i.d. We will consult cardiology, follows with Dr. Ubaldo Glassing of [facility]. Continue with home dosage of amiodarone, however, if cannot become rate controlled may need an amiodarone drip. We will admit to step-down telemetry. Trend cardiac enzymes x 3.  2.  Healthcare-associated pneumonia. Evidence of left upper lobe infiltrate, worsened than the previous films as well as febrile and leukocytosis. He did have a hospital stay in the last month. We will initiate antibiotic coverage with Levaquin, Zosyn, and vancomycin. Cultures have been sent, including blood. Supplemental O2 to keep SaO2 greater than 92%. DuoNeb treatments q. 4 hours.  3.  Type 2 diabetes, insulin-requiring, complicated by neuropathy. Continue with 70/30 insulin at home dosages. Insulin sliding scale, q. 6 hour Accu-Cheks.  4.  Hyperlipidemia. Statin therapy.  5.  Venous thromboembolism prophylaxis. Will be on therapeutic dosing of Lovenox 1 mg/kg b.i.d.   CODE STATUS: The patient full code.   CRITICAL CARE TIME SPENT: Fifty-five minutes.    ____________________________ Aaron Mose. Derk Doubek, MD dkh:TT D: 07/19/2014 22:17:52 ET T: 07/19/2014 22:48:14 ET JOB#: [id number]  cc: Aaron Mose. Malicia Blasdel, MD, <Dictator> Derenda Giddings Woodfin Ganja MD ELECTRONICALLY SIGNED 07/25/2014 21:33

## 2014-10-15 NOTE — Progress Notes (Signed)
Pt is alert and oriented. IV lasix. Pt report pain once during shift and was administered tylenol. No other signs of distress noted. Will continue to monitor.

## 2014-10-15 NOTE — Consult Note (Signed)
Chief Complaint:  Subjective/Chief Complaint Patient complains of mild shortness of breath some congestion. patient still has palpitations and tachycardia. patient complains of cough and last evening but better now   VITAL SIGNS/ANCILLARY NOTES: **Vital Signs.:   06-Feb-16 07:00  Temperature Temperature (F) 98.3  Celsius 36.8  Pulse Pulse 89  Respirations Respirations 23  Systolic BP Systolic BP 202  Diastolic BP (mmHg) Diastolic BP (mmHg) 78  Mean BP 88  Pulse Ox % Pulse Ox % 93  Pulse Ox Activity Level  At rest  Oxygen Delivery 2L  *Intake and Output.:   Daily 06-Feb-16 07:00  Grand Totals Intake:  2843.4 Output:  4800    Net:  -1956.6 24 Hr.:  -1956.6  Oral Intake      In:  340  Cardizem      In:  430  IV (Primary)      In:  1425  IV (Primary)      In:  398.4  IV (Secondary)      In:  250  Urine ml     Out:  4800  Length of Stay Totals Intake:  5234.1 Output:  8850    Net:  -3615.9   Brief Assessment:  GEN well developed, well nourished, no acute distress   Cardiac Irregular  murmur present  + LE edema   Respiratory normal resp effort  clear BS  rhonchi   Gastrointestinal Normal   Gastrointestinal details normal Soft   Lab Results: Hepatic:  06-Feb-16 05:15   Bilirubin, Total  1.3  Alkaline Phosphatase 65  SGPT (ALT) 46  SGOT (AST)  41  Total Protein, Serum 7.0  Albumin, Serum  2.8  Lab:  06-Feb-16 05:00   O2 Saturation (Pulse Ox) 88  FiO2 (Pulse Ox) 0.21 (Result(s) reported on 22 Jul 2014 at 05:24AM.)  Routine Chem:  06-Feb-16 05:15   Glucose, Serum  252  BUN 15  Creatinine (comp) 1.08  Sodium, Serum  134  Potassium, Serum 3.9  Chloride, Serum 101  CO2, Serum 25  Calcium (Total), Serum  8.2  Osmolality (calc) 278  eGFR (African American) >60  eGFR (Non-African American) >60 (eGFR values <27mL/min/1.73 m2 may be an indication of chronic kidney disease (CKD). Calculated eGFR, using the MRDR Study equation, is useful in  patients with stable  renal function. The eGFR calculation will not be reliable in acutely ill patients when serum creatinine is changing rapidly. It is not useful in patients on dialysis. The eGFR calculation may not be applicable to patients at the low and high extremes of body sizes, pregnant women, and vegetarians.)  Anion Gap 8  Routine Hem:  06-Feb-16 05:15   WBC (CBC) 9.7  RBC (CBC)  4.07  Hemoglobin (CBC)  11.3  Hematocrit (CBC)  34.7  Platelet Count (CBC)  138  MCV 85  MCH 27.8  MCHC 32.6  RDW  14.6  Neutrophil % 78.8  Lymphocyte % 9.9  Monocyte % 9.3  Eosinophil % 1.6  Basophil % 0.4  Neutrophil #  7.6  Lymphocyte # 1.0  Monocyte # 0.9  Eosinophil # 0.2  Basophil # 0.0 (Result(s) reported on 22 Jul 2014 at [facility].)   Radiology Results: XRay:    03-Feb-16 20:31, Chest Portable Single View  Chest Portable Single View   REASON FOR EXAM:    chest pain  COMMENTS:       PROCEDURE: DXR - DXR PORTABLE CHEST SINGLE VIEW  - Jul 19 2014  8:31PM     CLINICAL  DATA:  59 year old male with chest pain and recent  diagnosis of pneumonia    EXAM:  PORTABLE CHEST - 1 VIEW    COMPARISON:  Prior chest x-ray 06/30/2014 ; CT scan of the chest  performed earlier today.    FINDINGS:  Stable cardiac and mediastinal contours. There is mild cardiomegaly.  Patient is status post median sternotomy with evidence of prior  multivessel CABG.    Linear opacity in the left mid lung consistent with atelectasis as  seen on the recent CT scan. Additionally, there is faint linear  atelectasis versus scarring along the right minor fissure. No  pleural effusion or pneumothorax. No pulmonary edema. No acute  osseous abnormality.     IMPRESSION:  Stable chest x-ray with linear atelectasis versus scarring in the  anterior left upper lobe and along the right minor fissure.    Stable cardiomegaly.  Electronically Signed    By: Jacqulynn Cadet M.D.    On: 07/19/2014 20:54         Verified By: Criselda Peaches, M.D.,    05-Feb-16 07:29, Chest Portable Single View  Chest Portable Single View   REASON FOR EXAM:    sepsis  COMMENTS:       PROCEDURE: DXR - DXR PORTABLE CHEST SINGLE VIEW  - Jul 21 2014  7:29AM     CLINICAL DATA:  Sepsis.  AFib.  Shortness of breath.    EXAM:  PORTABLE CHEST - 1 VIEW    COMPARISON:  None.    FINDINGS:  Mediastinum and hilar structures normal. Cardiomegaly. Prior CABG.  Normal pulmonary vascularity. Interim partial clearing of left mid  lung and right base infiltrates. No pleural effusion or  pneumothorax.     IMPRESSION:  1. Interim partial clearing of leftmid lung and right base  pulmonary infiltrates.    2. Stable cardiomegaly. Prior CABG. No pulmonary venous congestion.      Electronically Signed    By: Marcello Moores  Register    On: 07/21/2014 07:33       Verified By: Osa Craver, M.D., MD  Cardiology:    03-Feb-16 19:50, ED ECG  Ventricular Rate 154  Atrial Rate 156  QRS Duration 92  QT 286  QTc 458  R Axis -16  T Axis 130  ECG interpretation   Supraventricular tachycardia  Incomplete right bundle branch block  Left ventricular hypertrophy with repolarization abnormality  Abnormal ECG  When compared with ECG of 02-Jul-2014 08:54,  Borderline criteria for Inferior infarct are no longer Present  ST now depressed in Anterior leads  ----------unconfirmed----------  Confirmed by OVERREAD, NOT (100), editor PEARSON, BARBARA (32) on 07/20/2014 9:46:43 AM  ED ECG     03-Feb-16 20:14, ECG  Ventricular Rate 155  Atrial Rate 163  QRS Duration 94  QT 312  QTc 501  R Axis -16  T Axis 125  ECG interpretation   Supraventricular tachycardia  Incomplete right bundle branch block  Left ventricular hypertrophy with repolarization abnormality  Nonspecific ST abnormality  Abnormal ECG  When compared with ECG of 02-Jul-2014 08:54,  Borderline criteria for Inferiorinfarct are no longer  Present  ----------unconfirmed----------  Confirmed by OVERREAD, NOT (100), editor PEARSON, BARBARA (32) on 07/20/2014 9:46:51 AM  ECG     03-Feb-16 20:18, ECG  Ventricular Rate 120  Atrial Rate 312  QRS Duration 108  QT 288  QTc 407  R Axis -12  T Axis 126  ECG interpretation   Atrial flutter with variable A-V  block  Incomplete right bundle branch block  Left ventricular hypertrophy with repolarization abnormality  Abnormal ECG  When compared with ECG of 19-Jul-2014 20:17,  No significant change was found  ----------unconfirmed----------  Confirmed by OVERREAD, NOT (100), editor PEARSON, BARBARA (32) on 07/20/2014 9:46:55 AM  ECG     04-Feb-16 12:54, Echo Doppler  Echo Doppler   REASON FOR EXAM:      COMMENTS:       PROCEDURE: [facility] - ECHO DOPPLER COMPLETE(TRANSTHOR)  - Jul 20 2014 12:54PM     RESULT: Echocardiogram Report    Patient Name:   Marcus Williams Date of Exam: 07/20/2014  Medical Rec #:  [id number]       Custom1:  Date of Birth:  Mar 13, 1956    Height:       69.0 in  Patient Age:    59 years     Weight:       307.0 lb  Patient Gender: M            BSA:          2.48 m??    Indications: MI  Sonographer:    Sherrie Sport RDCS  Referring Phys: Doy Hutching, JEFFREY, D    Sonographer Comments: Technically difficult study due to poor echo   windows and suboptimal apical window.    Summary:   1. Left ventricular ejection fraction, by visual estimation, is 45 to   50%.   2. Mildly decreased global left ventricular systolic function.   3. Impaired relaxation pattern of LV diastolic filling.   4. Moderate concentric left ventricular hypertrophy.   5. Moderately increased left ventricular septal thickness.   6. Mildly dilated left atrium.   7. Mildly dilated right atrium.   8. Mild mitral valve regurgitation.   9. Mild to moderate tricuspid regurgitation.  10. Moderately increased left ventricular posterior wall thickness.  2D AND M-MODE MEASUREMENTS (normal ranges within  parentheses):  Left Ventricle:          Normal  IVSd (2D):    1.88 cm (0.7-1.1)  LVPWd (2D):     1.67 cm (0.7-1.1) Aorta/LA:                  Normal  LVIDd (2D):     4.77 cm (3.4-5.7) Aortic Root (2D): 3.10 cm (2.4-3.7)  LVIDs (2D):     3.46 cm           Left Atrium (2D): 4.90 cm (1.9-4.0)  LV FS (2D):     27.5 %   (>25%)  LV EF (2D):     53.3 %   (>50%)                                    Right Ventricle:                                    RVd (2D):        4.01 cm  LV DIASTOLIC FUNCTION:  MV Peak E: 1.29 m/s E/e' Ratio: 9.80                      Decel Time: 158 msec  SPECTRAL DOPPLER ANALYSIS (where applicable):  Mitral Valve:  MV P1/2 Time: 45.82 msec  MV Area, PHT: 4.80 cm??  Aortic Valve: AoV Max Vel: 2.27  m/s AoV Peak PG: 20.6 mmHg AoV Mean PG:   12.7 mmHg  LVOT Vmax: 0.81 m/s LVOT VTI: 0.151 m LVOT Diameter: 2.10 cm  AoV Area, Vmax: 1.24 cm?? AoV Area, VTI: 1.62 cm?? AoV Area, Vmn: 1.25 cm??  Tricuspid Valve and PA/RV Systolic Pressure: TR Max Velocity: 1.95 m/s RA   Pressure: 5 mmHg RVSP/PASP: 20.2 mmHg  Pulmonic Valve:  PV Max Velocity: 0.81 m/s PV Max PG: 2.6 mmHg PV Mean PG:    PHYSICIAN INTERPRETATION:  Left Ventricle: Not well seen. The left ventricular internal cavity size     was normal. LV septal wall thickness was moderately increased. LV   posterior wall thickness was moderately increased. Moderate concentric   left ventricular hypertrophy. Global LV systolic function was mildly   decreased. Left ventricular ejection fraction, by visual estimation, is   45 to 50%. Spectral Doppler shows impaired relaxation pattern of LV   diastolic filling.  Right Ventricle: The right ventricular size is normal. Global RV systolic   function is low normal.  Left Atrium: The left atrium is mildly dilated.  Right Atrium: The right atrium is mildly dilated.  Pericardium: There is no evidenceof pericardial effusion.  Mitral Valve: The mitral valve is normal in structure. Mild  mitral valve   regurgitation is seen.  Tricuspid Valve: The tricuspid valve is normal. Mild to moderate   tricuspid regurgitation is visualized. The tricuspid regurgitant velocity     is 1.95 m/s, and with an assumed right atrial pressure of 5 mmHg, the   estimated right ventricular systolic pressure is normal at 20.2 mmHg.  Aortic Valve: The aortic valve is normal. No evidence of aortic valve   regurgitation isseen.  Pulmonic Valve: The pulmonic valve is normal. No indication of pulmonic   valve regurgitation.    [place] MD  Electronically signed by [place] Lujean Amel MD  Signature Date/Time: 07/21/2014/8:03:35 AM    *** Final ***    IMPRESSION: .    Verified By: Yolonda Kida, M.D., MD    05-Feb-16 13:52, TEE  TEE   REASON FOR EXAM:      COMMENTS:       PROCEDURE: [facility] - ECHO TRANSESOPHAGEAL  - Jul 21 2014  1:52PM     RESULT: Transesophageal Echocardiogram Report    Patient Name:   Marcus Williams Date of Exam: 07/21/2014  Medical Rec #:  [id number]       Custom1:  Date of Birth:  16-Jan-1956    Height:  Patient Age:    2 years     Weight:  Patient Gender: M            BSA:    Indications: Endocarditis  Sonographer:    Arville Go RDCS  Referring Phys: SPARKS, JEFFREY, D  SPECTRAL DOPPLER ANALYSIS (where applicable):    PHYSICIAN INTERPRETATION:  Left Ventricle: Left ventricular ejection fraction, by visual estimation,   is 60 to 65%.  Right Ventricle: Normal right ventricular size, wall thickness, and   systolic function. RV wall thickness is normal.  Left Atrium: The left atrium is mildly dilated.  Right Atrium: The right atrium is mildly dilated.  Pericardium: There is no evidence of pericardial effusion.  Mitral Valve: Mild mitral valve regurgitation is seen. No mitral   vegetation.  Tricuspid Valve: Mild tricuspid regurgitation is visualized.  Aortic Valve: The aortic valve is bicuspid with doming. Mild aortic valve     sclerosis is present,  with no evidence of aortic  valve stenosis. Trivial   aortic valve regurgitation is seen. Aaortic valve bicuspid with   thickineing and cannot rule out very small vegetaion but appears only to   be thickening.  Aorta: The aortic root and ascending aorta are structurally normal, with   no evidence of dilitation.    Summary:   1. Left ventricular ejection fraction, by visual estimation, is 60 to   65%.   2. Mildly dilated left atrium.   3. Mildly dilated right atrium.   4. Mild mitral valve regurgitation.   5. Aortic valve is bicuspid with doming.   6. Mild aortic valve sclerosis without stenosis.   7. Mild tricuspid regurgitation.   8. Aaortic valve bicuspid with thickineing and cannot rule out very   small vegetaion but appears only to be thickening.   9. No mitral vegetation.  [id number] Serafina Royals MD  Electronically signed by [id number] Serafina Royals MD  Signature Date/Time: 07/21/2014/5:04:44 PM  *** Final ***    IMPRESSION: .        Verified By: Corey Skains  ([facility]), M.D., MD   Assessment/Plan:  Assessment/Plan:  Assessment atrial fibrillation  obesity  respiratory failure  bacteremia/ cellulitis  COPD  status post TEE  hypertension  obstructive sleep apnea .   Plan continue amiodarone load  agree with IV Cardizem therapy for rate control  continue anticoagulation  recommend add digoxin for rate control  continue low-dose beta-blockers  agree with broad-spectrum antibiotics for bacteremia  consider cardioversion if not better in 24-48 hours  continue inhalers and treatment for bronchitis  consider pulmonary improve for history failure  obstructive sleep apnea therapy   Electronic Signatures: Yolonda Kida (MD)  (Signed 06-Feb-16 12:16)  Authored: Chief Complaint, VITAL SIGNS/ANCILLARY NOTES, Brief Assessment, Lab Results, Radiology Results, Assessment/Plan   Last Updated: 06-Feb-16 12:16 by Lujean Amel D (MD)

## 2014-10-15 NOTE — Consult Note (Signed)
PATIENT NAME:  Marcus Williams, Marcus Williams MR#:  [id number] DATE OF BIRTH:  08-22-1955  DATE OF CONSULTATION:  07/20/2014  REFERRING PHYSICIAN:  Aaron Mose. Hower, MD  CONSULTING PHYSICIAN:  Anselm Aumiller D. Clayborn Bigness, MD  PRIMARY CARE PHYSICIAN: Leonie Douglas. Doy Hutching, MD   CARDIOLOGIST: Javier Docker. Fath, MD   INDICATIONS: Tachycardia, chest pain, shortness of breath.   HISTORY OF PRESENT ILLNESS: The patient is a 59 year old obese white male with known coronary artery disease, coronary bypass surgery, diabetes, hyperlipidemia, peripheral neuropathy, who presented with  chest pain symptoms, shortness of breath, acute retrosternal chest discomfort, pressure or 8/10, worsening. The patient also had some diaphoresis. He describes shortness of breath of 1-2 weeks in duration with shortness of breath dyspnea with cough, nonproductive, lower extremity edema, who presented to the hospital for symptoms. On arrival, noted to have tachycardic rate, thought to be SVT narrow complex, heart rate of about 130. Treated with IV Cardizem, which slowed his rate, and what looks like atrial fibrillation or flutter. The patient was also thought to potentially have pneumonia in the left upper lobe as well, so he was admitted and cardiology consultation was recommended because of tachycardia. The patient also states he fell and may have fractured his left rib area about 2 weeks ago.   REVIEW OF SYSTEMS: Denies blackout spells, syncope. No nausea or vomiting. He may have had low-grade fever. He has had chills, some sweats. No weight loss. No weight gain. Denies hemoptysis. No hematemesis. Denies bright red blood per rectum. Denies any vision change, hearing change. No sputum production. He had some cough and congestion. He has had some chest pain, as well. No urinary symptoms. He denies thyroid symptoms.   PAST MEDICAL HISTORY: Hyperlipidemia, reflux, obesity, coronary artery disease, diabetes, neuropathy, hypertension, SVT, possibly atrial fibrillation,  possible obstructive sleep apnea.   PAST SURGICAL HISTORY: Coronary bypass surgery.   SOCIAL HISTORY: Married. No smoking or  alcohol consumption.   FAMILY HISTORY: Diabetes, coronary artery disease.   ALLERGIES: None.   MEDICATIONS: Tylenol with Codeine every 8 hours p.r.n., aspirin 325 a day, Imdur 30 a day, nitroglycerin 0.4 p.r.n., diltiazem 120 twice a day, Levaquin 75 mg 2 capsules every day, trazodone 100 mg at bedtime, glimepiride 4 mg daily, insulin 70/30 100 units at bedtime and 70 units in the morning, metformin 1000 mg twice a day, simvastatin 40 mg a day, pramipexole 0.5 mg at bedtime, metoprolol 50 mg every 8 hours, Lasix 20 mg a day, Protonix 40 mg a day.   PHYSICAL EXAMINATION: VITAL SIGNS: Blood pressure was 170/80, pulse of 150 and irregular, respiratory rate of 30, afebrile. BMI 44.5.  HEENT: Normocephalic, atraumatic, mild respiratory distress.  NECK: Supple. No significant JVD, bruits, or adenopathy.  LUNGS: Bilateral rhonchi. No rales, no wheezing, with coarse breath sounds in the left base.  HEART: Tachycardic. Systolic ejection murmur at the apex.  ABDOMEN: Benign.  EXTREMITIES: Within normal limits.  NEUROLOGIC: Intact.  SKIN: Normal.   DIAGNOSTIC AND REACTIVE AIRWAY DISEASE: EKG: Now a complex tachycardia, probably atrial fibrillation versus SVT, rate of about 140, nonspecific findings. CT of the chest showed negative for PE, worsening anterior upper lobe atelectasis versus pneumonia with healing fracture. Chest x-ray shows linear atelectasis of the left upper lobe.    LABORATORY DATA: Sodium 140, potassium 3.9, chloride 103, bicarbonate 29, BUN 23, creatinine 1.04, glucose of 171. BNP 1400. Troponin 0.66, MB 73. White count of 16, hemoglobin 13, platelet count 204.   ASSESSMENT: Atrial fibrillation, rapid ventricular response,  tachycardia, possible supraventricular tachycardia, obesity, possible pneumonia, hypoxemia, possible obstructive sleep apnea. The patient  has hypertension, diabetes, hyperlipidemia, fractured rib, gastroesophageal reflux disease.   PLAN: 1. Agree with admit.  2. Follow up troponins since it is mildly elevated.  3. Recommend, possible echocardiogram.  4. Continue respiratory support.  5. Continue antibiotics.  6. Continue supplemental oxygen.  7. Continue inhalers.  8. Continue diabetes management and control.  9. Continue blood pressure control.  10. The patient may need EP evaluation for obstructive sleep apnea, possibly a sleep study.  11. Recommend weight loss.  12. Continue lipid management.  13. Continue reflux therapy.  14. Continue DVT prophylaxis.  15. We will continue cardiology evaluation and see how the patient does with followup troponins, but we will mainly recommend respiratory therapy for possible pneumonia, atelectasis bronchitis. Once the patient is improved, we will consider further cardiac evaluation and hopefully as an outpatient with his primary physician. Will continue to follow as the patient improves. Rate control is paramount at this point and consider possibly antiarrhythmic. May need long-term anticoagulation as well, because of his significant CHADS score,    ____________________________ Loran Senters. Clayborn Bigness, MD ddc:mw D: 07/20/2014 11:25:39 ET T: 07/20/2014 11:46:27 ET JOB#: [id number]  cc: Eben Choinski D. Clayborn Bigness, MD, <Dictator> Yolonda Kida MD ELECTRONICALLY SIGNED 07/25/2014 17:54

## 2014-10-15 NOTE — Op Note (Signed)
PATIENT NAME:  Marcus Williams, Marcus MR#:  Williams DATE OF BIRTH:  [birth year]  DATE OF PROCEDURE:  07/24/2014  PROCEDURE: Cardioversion.   INDICATION: Atrial fibrillation.   DESCRIPTION AND PROCEDURE:  The patient brought to special recovery because of atrial fibrillation and rapid ventricular response. Anesthesia was used for sedation. The patient's initial EKG showed atrial fibrillar rate of about 130. The patient's blood pressure was about 140/80.  He, again, was sedated by anesthesia and subsequently received 100 joules biphasic, which converted him to normal sinus rhythm of about 65. The patient tolerated procedure well, had amnesia for the procedure, denies any symptoms and was covered at special recovery and by anesthesia and  tolerated the procedure well.   CONCLUSION:  A successful cardioversion from atrial fibrillation to normal sinus rhythm with the help with anesthesia for sedation.    TIME OF THE PROCEDURE:  Around 1330.    ____________________________ Loran Senters. Clayborn Bigness, MD ddc:at D: 07/25/2014 11:57:00 ET T: 07/25/2014 12:22:17 ET JOB#: [id number]  cc: Dwayne D. Clayborn Bigness, MD, <Dictator> Yolonda Kida MD ELECTRONICALLY SIGNED 07/25/2014 17:58

## 2014-10-15 NOTE — Discharge Summary (Signed)
Physician Discharge Summary  Patient ID: Marcus Williams MRN: [id number] DOB/AGE: 59/11/1955 59 y.o.  Admit date: 10/14/2014 Discharge date: 10/15/2014  Admission Diagnoses:  Discharge Diagnoses:  Chronic CHF, chronic A-fib, Obesity, DM  Discharged Condition: stable  Hospital Course: Pt stable on RA. Ruled out for MI. Seen by Cardiology and cleared.  Consults: cardiology  Significant Diagnostic Studies: labs: CXR  Treatments: cardiac meds: metoprolol, diltiazem, furosemide and amiodarone and insulin: regular  Discharge Exam: Blood pressure 129/71, pulse 66, temperature 98.2 F (36.8 C), temperature source Oral, height 5\' 7"  (1.702 m), weight 130.455 kg (287 lb 9.6 oz), SpO2 99 %. General appearance: alert and no distress Neck: no adenopathy, no carotid bruit, no JVD, supple, symmetrical, trachea midline and thyroid not enlarged, symmetric, no tenderness/mass/nodules Resp: rales bibasilar Cardio: regular rate and rhythm, S1, S2 normal, no murmur, click, rub or gallop GI: soft, non-tender; bowel sounds normal; no masses,  no organomegaly Extremities: edema trace  Disposition: 01-Home or Self Care     Medication List    STOP taking these medications        amoxicillin 500 MG tablet  Commonly known as:  AMOXIL     CHLORPHENIRAMINE-HYDROCODONE PO     diltiazem 300 MG 24 hr capsule  Commonly known as:  TIAZAC     Fluticasone-Salmeterol 250-50 MCG/DOSE Aepb  Commonly known as:  ADVAIR     guaiFENesin 600 MG 12 hr tablet  Commonly known as:  MUCINEX      TAKE these medications        ALBUTEROL-IPRATROPIUM IN  Inhale 2.5 mg into the lungs 4 (four) times daily. 2.5mg -0.5mg /24ml inhalation 4 times a day     amiodarone 200 MG tablet  Commonly known as:  PACERONE  Take 200 mg by mouth daily.     aspirin EC 325 MG tablet  Take 325 mg by mouth daily.     budesonide-formoterol 160-4.5 MCG/ACT inhaler  Commonly known as:  SYMBICORT  Inhale 2 puffs into the lungs 2 (two)  times daily.     diltiazem 300 MG 24 hr capsule  Commonly known as:  CARDIZEM CD  Take 1 capsule (300 mg total) by mouth daily.     fluticasone 50 MCG/ACT nasal spray  Commonly known as:  FLONASE  Place 1 spray into both nostrils daily.     furosemide 40 MG tablet  Commonly known as:  LASIX  Take 1 tablet (40 mg total) by mouth 2 (two) times daily.     glimepiride 4 MG tablet  Commonly known as:  AMARYL  Take 4 mg by mouth daily.     insulin aspart 100 UNIT/ML injection  Commonly known as:  novoLOG  Inject 40 Units into the skin daily.     LORazepam 1 MG tablet  Commonly known as:  ATIVAN  Take 1 mg by mouth every 4 (four) hours as needed for anxiety.     metFORMIN 1000 MG tablet  Commonly known as:  GLUCOPHAGE  Take 1,000 mg by mouth 2 (two) times daily with a meal.     metoprolol tartrate 25 MG tablet  Commonly known as:  LOPRESSOR  Take 1 tablet (25 mg total) by mouth 2 (two) times daily.     montelukast 10 MG tablet  Commonly known as:  SINGULAIR  Take 10 mg by mouth at bedtime.     nitroGLYCERIN 0.6 mg/hr patch  Commonly known as:  NITRODUR - Dosed in mg/24 hr  Place 0.6 mg onto the skin  daily.     nitroGLYCERIN 0.4 MG SL tablet  Commonly known as:  NITROSTAT  Place 0.4 mg under the tongue every 5 (five) minutes as needed for chest pain.     pantoprazole 40 MG tablet  Commonly known as:  PROTONIX  Take 40 mg by mouth daily.     pramipexole 0.5 MG tablet  Commonly known as:  MIRAPEX  Take 0.5 mg by mouth at bedtime.     pregabalin 75 MG capsule  Commonly known as:  LYRICA  Take 75 mg by mouth 2 (two) times daily.     Rivaroxaban 15 MG Tabs tablet  Commonly known as:  XARELTO  Take 15 mg by mouth daily with supper.     simvastatin 40 MG tablet  Commonly known as:  ZOCOR  Take 40 mg by mouth daily.     traZODone 100 MG tablet  Commonly known as:  DESYREL  Take 100 mg by mouth at bedtime.         Signed: Kendarious Gudino D 10/15/2014, 7:03  AM

## 2014-10-15 NOTE — H&P (Signed)
PATIENT NAME:  Marcus Williams, Marcus Williams MR#:  [id number] DATE OF BIRTH:  01-23-1956  DATE OF ADMISSION:  07/01/2014  REFERRING PHYSICIAN: Larae Grooms, MD   PRIMARY CARE PHYSICIAN: Leonie Douglas. Doy Hutching, MD, [facility]   CARDIOLOGY: Javier Docker. Ubaldo Glassing, MD of [place]: Chest pain.   HISTORY OF PRESENT ILLNESS: A 59 year old Caucasian gentleman with history of coronary artery disease, status post CABG, as well as type 2 diabetes insulin requiring however  uncomplicated presenting with chest pain. Describes acute onset of chest pain while unloading groceries from his truck on the day of admission. Retrosternal in location, nonradiating, sharp pressure in quality, 7 to 8 out of 10 intensity, no worsening or relieving factors. Upon presentation to the Emergency Department was noted to be tachycardic, heart rate in the 130s-140s, narrow complex tachycardia. The patient denies any fevers or chills. Does attest to having some shortness of breath with associated wheezing, which has been present for the last 3 to 4 days in total duration.   REVIEW OF SYSTEMS:   CONSTITUTIONAL: Denies fevers, chills. Positive for fatigue, weakness.  EYES: Denies blurred vision, double vision, eye pain.  EARS, NOSE, THROAT: Denies tinnitus, ear pain, hearing loss.  RESPIRATORY:  Positive for shortness of breath, cough, wheeze as described above.  CARDIOVASCULAR: Positive for chest pain as described above. Denies any orthopnea or palpitations. Positive for edema.  GASTROINTESTINAL: Denies nausea, vomiting, diarrhea, abdominal pain.  GENITOURINARY: Denies any dysuria or hematuria.  ENDOCRINE: Denies nocturia or thyroid problems. HEMATOLOGIC AND LYMPHATIC:  Denies easy bruising or bleeding.  SKIN: Denies rash or lesion.  MUSCULOSKELETAL: Denies pain in neck, back, shoulder, knees, hips or arthritic symptoms.  NEUROLOGIC: Denies paralysis, paresthesias.  PSYCHIATRIC: Denies anxiety or depressive  symptoms.  Otherwise full review of systems performed by me is negative.    PAST MEDICAL HISTORY: Significant for coronary artery disease, status post 4 vessel CABG, type 2 diabetes, insulin requiring, uncomplicated   SOCIAL HISTORY: Denies any alcohol, tobacco, or drug usage.   FAMILY HISTORY: Positive for diabetes as well as coronary artery disease.   ALLERGIES: No known drug allergies. Her   HOME MEDICATIONS: Include acetaminophen/oxycodone 325/5 mg p.o. q. 4 hours as needed for pain, aspirin 325 mg p.o. daily, Imdur 30 mg p.o. daily, nitroglycerin 0.4 mg sublingual every 5 minutes as needed for chest pain, Lyrica 75 mg p.o. b.i.d., trazodone 100 mg p.o. at bedtime, glimepiride 4 mg p.o. daily, metformin 1000 mg p.o. b.i.d., NovoLog 70/30, 70 units in the a.m., 100 units at bedtime, promethazine 25 mg p.o. q. 6 hours as needed for nausea and vomiting, simvastatin 40 mg p.o. at bedtime, hydrochlorothiazide/valsartan 12.5 mg/80 mg p.o. daily, pramipexole 0.5 mg p.o. at bedtime, metoprolol titrate 50 mg p.o. daily, Lasix 20 mg p.o. daily as needed for edema, pantoprazole 40 mg p.o. daily.   PHYSICAL EXAMINATION:  VITAL SIGNS: Temperature 98.4, heart rate 127, respirations 20, blood pressure is 152/89, saturating 97% on room air, weight 131.2 kg, BMI of 45.4.  GENERAL: Obese Caucasian gentleman currently in no acute distress.  HEAD: Normocephalic, atraumatic.  EYES: Pupils equal, round, reactive to light. Extraocular muscles intact. No scleral icterus.  MOUTH: Moist mucosal membrane. Dentition intact. No abscess noted.  EAR, NOSE, THROAT: Clear without exudates. No external lesions.  NECK: Supple. No thyromegaly. No nodules. No JVD.  PULMONARY: Diffuse expiratory wheezing throughout all lung fields with scattered rhonchi in the left lung. Good air entry bilaterally.  CHEST: Minimal tenderness to  palpation over the retrosternal location, reproducing symptoms though much milder than  previous. CARDIOVASCULAR: S1 and S2, tachycardic. No murmurs, rubs or gallops. Two plus edema of bilateral lower extremities to the knees.  Pedal pulses 2+ bilaterally. GASTROINTESTINAL: Soft, nontender, nondistended. No masses.  Positive bowel sounds. No hepatosplenomegaly.  MUSCULOSKELETAL: No swelling, clubbing, positive for edema as described above. Range of motion full in all extremities.  NEUROLOGIC: Cranial nerves II through XII intact. No gross focal neurological deficits. Sensation intact. Reflexes intact.  SKIN: No ulceration, lesions, rashes, or cyanosis. Skin warm and dry. Turgor intact.  PSYCHIATRIC: Mood and affect within normal limits. The patient is awake, alert, oriented x 3. Insight, judgment intact.   LABORATORY DATA: Chest x-ray performed reveals left mid lung and basilar air space opacification. CT of the chest to rule out PE performed reveals no evidence of PE, however, focal posterior left upper lobe airspace opacification consistent with pneumonia as well as bibasilar atelectasis.   Remainder of laboratory data: Sodium 137, potassium 4, chloride 102, bicarbonate 27, BUN of 16, creatinine of 0.92, glucose 240, troponin 0.03, WBC 9, hemoglobin 13.8, platelets of 143.   ASSESSMENT AND PLAN: A 59 year old Caucasian male with a history of coronary disease status post, coronary artery bypass graft as well as type 2 diabetes insulin requiring complicated by peripheral neuropathy presenting with chest pain.  1.  Chest pain, central in location. We are going to admit to telemetry. Initiate aspirin and statin therapy. Trend cardiac enzymes x 2. We will consult cardiology as well as with [facility], Dr. Ubaldo Glassing.  2.  Narrow complex tachycardia with a goal heart rate less than 120. It appears that there is some question of whether this was SVT upon arrival. However, currently he is in sinus tachycardia, heart rate of about 115. CT was performed to rule PE, which was negative. We will end  up treating for pneumonia given the patient's symptoms and radiographic findings.  3.  Type 2 diabetes, insulin requiring, complicated by neuropathy. Continue with 70/30 insulin as well as insulin sliding scale, q. 6 hour Accu-Cheks, hold n.p.o. agents.  4.  Community-acquired pneumonia. Provide DuoNeb treatments q. 4 hours as needed for shortness of breath.  Supplemental O2 to keep saturations greater than 92% and receive ceftriaxone.  We will add azithromycin.  5.  Venous thromboembolism prophylaxis with heparin subcutaneous.   CODE STATUS: The patient is full code.   TIME SPENT: 45 minutes.   ____________________________ Aaron Mose. Hower, MD dkh:AT D: 07/01/2014 00:47:34 ET T: 07/01/2014 01:32:05 ET JOB#: [id number]  cc: Aaron Mose. Hower, MD, <Dictator> DAVID Woodfin Ganja MD ELECTRONICALLY SIGNED 07/01/2014 4:03

## 2014-10-15 NOTE — Consult Note (Signed)
Chief Complaint:  Subjective/Chief Complaint Patient states to feel better today denies shortness of breath no chest pain  wants to ambulate would like to take a shower.   VITAL SIGNS/ANCILLARY NOTES: **Vital Signs.:   09-Feb-16 08:00  Temperature Temperature (F) 97.3  Celsius 36.2  Pulse Pulse 64  Respirations Respirations 20  Systolic BP Systolic BP 295  Diastolic BP (mmHg) Diastolic BP (mmHg) 84  Mean BP 108  Pulse Ox % Pulse Ox % 93  Oxygen Delivery 2L  *Intake and Output.:   Daily 09-Feb-16 07:00  Grand Totals Intake:  1999.2 Output:  1950    Net:  49.2 24 Hr.:  49.2  IV (Primary)      In:  199.2  IV (Primary)      In:  1700  IV (Secondary)      In:  100  Urine ml     Out:  1950  Length of Stay Totals Intake:  12007.3 Output:  15070    Net:  -3062.7   Brief Assessment:  GEN well developed, well nourished, no acute distress, obese   Cardiac Regular  no murmur   Respiratory normal resp effort  rhonchi   Gastrointestinal Normal   EXTR negative edema   Lab Results: Lab:  09-Feb-16 04:00   O2 Saturation (Pulse Ox) 90  FiO2 (Pulse Ox) RA (Result(s) reported on 25 Jul 2014 at 05:00AM.)  Routine Chem:  09-Feb-16 03:27   Glucose, Serum  286  BUN  30  Creatinine (comp)  1.49  Sodium, Serum 138  Potassium, Serum 3.7  Chloride, Serum 102  CO2, Serum 25  Calcium (Total), Serum  8.2  Anion Gap 11  Osmolality (calc) 292  eGFR (African American) >60  eGFR (Non-African American)  51 (eGFR values <68m/min/1.73 m2 may be an indication of chronic kidney disease (CKD). Calculated eGFR, using the MRDR Study equation, is useful in  patients with stable renal function. The eGFR calculation will not be reliable in acutely ill patients when serum creatinine is changing rapidly. It is not useful in patients on dialysis. The eGFR calculation may not be applicable to patients at the low and high extremes of body sizes, pregnant women, and vegetarians.)  Routine Hem:   09-Feb-16 03:27   WBC (CBC)  14.1  RBC (CBC)  4.01  Hemoglobin (CBC)  11.0  Hematocrit (CBC)  34.2  Platelet Count (CBC) 195  MCV 85  MCH 27.4  MCHC 32.1  RDW  14.7  Neutrophil % 85.6  Lymphocyte % 7.2  Monocyte % 7.0  Eosinophil % 0.0  Basophil % 0.2  Neutrophil #  12.0  Lymphocyte # 1.0  Monocyte # 1.0  Eosinophil # 0.0  Basophil # 0.0 (Result(s) reported on 25 Jul 2014 at 04:17AM.)   Radiology Results: XRay:    03-Feb-16 20:31, Chest Portable Single View  Chest Portable Single View   REASON FOR EXAM:    chest pain  COMMENTS:       PROCEDURE: DXR - DXR PORTABLE CHEST SINGLE VIEW  - Jul 19 2014  8:31PM     CLINICAL DATA:  59year old male with chest pain and recent  diagnosis of pneumonia    EXAM:  PORTABLE CHEST - 1 VIEW    COMPARISON:  Prior chest x-ray 06/30/2014 ; CT scan of the chest  performed earlier today.    FINDINGS:  Stable cardiac and mediastinal contours. There is mild cardiomegaly.  Patient is status post median sternotomy with evidence of prior  multivessel CABG.  Linear opacity in the left mid lung consistent with atelectasis as  seen on the recent CT scan. Additionally, there is faint linear  atelectasis versus scarring along the right minor fissure. No  pleural effusion or pneumothorax. No pulmonary edema. No acute  osseous abnormality.     IMPRESSION:  Stable chest x-ray with linear atelectasis versus scarring in the  anterior left upper lobe and along the right minor fissure.    Stable cardiomegaly.  Electronically Signed    By: Jacqulynn Cadet M.D.    On: 07/19/2014 20:54         Verified By: Criselda Peaches, M.D.,    05-Feb-16 07:29, Chest Portable Single View  Chest Portable Single View   REASON FOR EXAM:    sepsis  COMMENTS:       PROCEDURE: DXR - DXR PORTABLE CHEST SINGLE VIEW  - Jul 21 2014  7:29AM     CLINICAL DATA:  Sepsis.  AFib.  Shortness of breath.    EXAM:  PORTABLE CHEST - 1 VIEW    COMPARISON:   None.    FINDINGS:  Mediastinum and hilar structures normal. Cardiomegaly. Prior CABG.  Normal pulmonary vascularity. Interim partial clearing of left mid  lung and right base infiltrates. No pleural effusion or  pneumothorax.     IMPRESSION:  1. Interim partial clearing of leftmid lung and right base  pulmonary infiltrates.    2. Stable cardiomegaly. Prior CABG. No pulmonary venous congestion.      Electronically Signed    By: Marcello Moores  Register    On: 07/21/2014 07:33       Verified By: Osa Craver, M.D., MD    07-Feb-16 08:19, Chest Portable Single View  Chest Portable Single View   REASON FOR EXAM:    SOB  COMMENTS:       PROCEDURE: DXR - DXR PORTABLE CHEST SINGLE VIEW  - Jul 23 2014  8:19AM     CLINICAL DATA:  Fall, left chest pain, history of pneumonia    EXAM:  PORTABLE CHEST - 1 VIEW    COMPARISON:  07/21/2014    FINDINGS:  Increasing right upper lobe opacity, suspicious for pneumonia.  Cardiomegaly with pulmonary vascular congestion. No frank  interstitial edema. No pneumothorax.    Postsurgical changes related to prior CABG.     IMPRESSION:  Increasing right upper lobe opacity, suspicious for pneumonia.      Electronically Signed    By: Julian Hy M.D.    On: 07/23/2014 10:19         Verified By: Julian Hy, M.D.,    09-Feb-16 08:05, Chest PA and Lateral  Chest PA and Lateral   REASON FOR EXAM:    SOB  COMMENTS:       PROCEDURE: DXR - DXR CHEST PA (OR AP) AND LATERAL  - Jul 25 2014  8:05AM     CLINICAL DATA:  59 year old male with shortness of breath    EXAM:  CHEST  2 VIEW    COMPARISON:  Prior chest x-ray 07/23/2014    FINDINGS:  Stable cardiomegaly and mediastinal contours. Patient is status post  median sternotomy with evidence of prior multivessel CABG. Interval  progression of diffuse bilateral interstitial and airspace  opacities. The appearance is most suggestive of a pulmonary edema.  The underlying right  upper lobe opacity seen on the prior radiograph  is now largely obscured by the superimposed edema. Small bilateral  pleural effusions and associated bibasilar  atelectasis. No  pneumothorax.     IMPRESSION:  1. Interval development of pulmonary edema suggesting either CHF or  volume overload.  2. Small bilateral layering pleural effusions.  3. Previously noted increasing right upper lobe opacities in a  largely obscured by the superimposed edema.    Electronically Signed    By: Jacqulynn Cadet M.D.    On: 07/25/2014 08:11         Verified By: Criselda Peaches, M.D.,  Cardiology:    03-Feb-16 19:50, ED ECG  Ventricular Rate 154  Atrial Rate 156  QRS Duration 92  QT 286  QTc 458  R Axis -16  T Axis 130  ECG interpretation   Supraventricular tachycardia  Incomplete right bundle branch block  Left ventricular hypertrophy with repolarization abnormality  Abnormal ECG  When compared with ECG of 02-Jul-2014 08:54,  Borderline criteria for Inferior infarct are no longer Present  ST now depressed in Anterior leads  ----------unconfirmed----------  Confirmed by OVERREAD, NOT (100), editor PEARSON, BARBARA ([id number]) on 07/20/2014 9:46:43 AM  ED ECG     03-Feb-16 20:14, ECG  Ventricular Rate 155  Atrial Rate 163  QRS Duration 94  QT 312  QTc 501  R Axis -16  T Axis 125  ECG interpretation   Supraventricular tachycardia  Incomplete right bundle branch block  Left ventricular hypertrophy with repolarization abnormality  Nonspecific ST abnormality  Abnormal ECG  When compared with ECG of 02-Jul-2014 08:54,  Borderline criteria for Inferiorinfarct are no longer Present  ----------unconfirmed----------  Confirmed by OVERREAD, NOT (100), editor PEARSON, BARBARA (32) on 07/20/2014 9:46:51 AM  ECG     03-Feb-16 20:18, ECG  Ventricular Rate 120  Atrial Rate 312  QRS Duration 108  QT 288  QTc 407  R Axis -12  T Axis 126  ECG interpretation   Atrial flutter with variable A-V  block  Incomplete right bundle branch block  Left ventricular hypertrophy with repolarization abnormality  Abnormal ECG  When compared with ECG of 19-Jul-2014 20:17,  No significant change was found  ----------unconfirmed----------  Confirmed by OVERREAD, NOT (100), editor PEARSON, BARBARA ([id number]) on 07/20/2014 9:46:55 AM  ECG     04-Feb-16 12:54, Echo Doppler  Echo Doppler   REASON FOR EXAM:      COMMENTS:       PROCEDURE: [facility] - ECHO DOPPLER COMPLETE(TRANSTHOR)  - Jul 20 2014 12:54PM     RESULT: Echocardiogram Report    Patient Name:   DAVEN MONTZ Date of Exam: 07/20/2014  Medical Rec #:  [id number]       Custom1:  Date of Birth:  1956-03-22    Height:       69.0 in  Patient Age:    38 years     Weight:       307.0 lb  Patient Gender: M            BSA:          2.48 m??    Indications: MI  Sonographer:    Sherrie Sport RDCS  Referring Phys: Doy Hutching, JEFFREY, D    Sonographer Comments: Technically difficult study due to poor echo   windows and suboptimal apical window.    Summary:   1. Left ventricular ejection fraction, by visual estimation, is 45 to   50%.   2. Mildly decreased global left ventricular systolic function.   3. Impaired relaxation pattern of LV diastolic filling.   4. Moderate concentric left ventricular hypertrophy.  5. Moderately increased left ventricular septal thickness.   6. Mildly dilated left atrium.   7. Mildly dilated right atrium.   8. Mild mitral valve regurgitation.   9. Mild to moderate tricuspid regurgitation.  10. Moderately increased left ventricular posterior wall thickness.  2D AND M-MODE MEASUREMENTS (normal ranges within parentheses):  Left Ventricle:          Normal  IVSd (2D):    1.88 cm (0.7-1.1)  LVPWd (2D):     1.67 cm (0.7-1.1) Aorta/LA:                  Normal  LVIDd (2D):     4.77 cm (3.4-5.7) Aortic Root (2D): 3.10 cm (2.4-3.7)  LVIDs (2D):     3.46 cm           Left Atrium (2D): 4.90 cm (1.9-4.0)  LV FS (2D):     27.5 %   (>25%)  LV  EF (2D):     53.3 %   (>50%)                                    Right Ventricle:                                    RVd (2D):        1.61 cm  LV DIASTOLIC FUNCTION:  MV Peak E: 1.29 m/s E/e' Ratio: 9.80                      Decel Time: 158 msec  SPECTRAL DOPPLER ANALYSIS (where applicable):  Mitral Valve:  MV P1/2 Time: 45.82 msec  MV Area, PHT: 4.80 cm??  Aortic Valve: AoV Max Vel: 2.27 m/s AoV Peak PG: 20.6 mmHg AoV Mean PG:   12.7 mmHg  LVOT Vmax: 0.81 m/s LVOT VTI: 0.151 m LVOT Diameter: 2.10 cm  AoV Area, Vmax: 1.24 cm?? AoV Area, VTI: 1.62 cm?? AoV Area, Vmn: 1.25 cm??  Tricuspid Valve and PA/RV Systolic Pressure: TR Max Velocity: 1.95 m/s RA   Pressure: 5 mmHg RVSP/PASP: 20.2 mmHg  Pulmonic Valve:  PV Max Velocity: 0.81 m/s PV Max PG: 2.6 mmHg PV Mean PG:    PHYSICIAN INTERPRETATION:  Left Ventricle: Not well seen. The left ventricular internal cavity size     was normal. LV septal wall thickness was moderately increased. LV   posterior wall thickness was moderately increased. Moderate concentric   left ventricular hypertrophy. Global LV systolic function was mildly   decreased. Left ventricular ejection fraction, by visual estimation, is   45 to 50%. Spectral Doppler shows impaired relaxation pattern of LV   diastolic filling.  Right Ventricle: The right ventricular size is normal. Global RV systolic   function is low normal.  Left Atrium: The left atrium is mildly dilated.  Right Atrium: The right atrium is mildly dilated.  Pericardium: There is no evidenceof pericardial effusion.  Mitral Valve: The mitral valve is normal in structure. Mild mitral valve   regurgitation is seen.  Tricuspid Valve: The tricuspid valve is normal. Mild to moderate   tricuspid regurgitation is visualized. The tricuspid regurgitant velocity     is 1.95 m/s, and with an assumed right atrial pressure of 5 mmHg, the   estimated right ventricular systolic pressure is normal at 20.2 mmHg.  Aortic  Valve: The  aortic valve is normal. No evidence of aortic valve   regurgitation isseen.  Pulmonic Valve: The pulmonic valve is normal. No indication of pulmonic   valve regurgitation.    Gail MD  Electronically signed by [place] Lujean Amel MD  Signature Date/Time: 07/21/2014/8:03:35 AM    *** Final ***    IMPRESSION: .    Verified By: Yolonda Kida, M.D., MD    05-Feb-16 13:52, TEE  TEE   REASON FOR EXAM:      COMMENTS:       PROCEDURE: [facility] - ECHO TRANSESOPHAGEAL  - Jul 21 2014  1:52PM     RESULT: Transesophageal Echocardiogram Report    Patient Name:   Marcus Williams Date of Exam: 07/21/2014  Medical Rec #:  [id number]       Custom1:  Date of Birth:  1955/11/08    Height:  Patient Age:    33 years     Weight:  Patient Gender: M            BSA:    Indications: Endocarditis  Sonographer:    Arville Go RDCS  Referring Phys: SPARKS, JEFFREY, D  SPECTRAL DOPPLER ANALYSIS (where applicable):    PHYSICIAN INTERPRETATION:  Left Ventricle: Left ventricular ejection fraction, by visual estimation,   is 60 to 65%.  Right Ventricle: Normal right ventricular size, wall thickness, and   systolic function. RV wall thickness is normal.  Left Atrium: The left atrium is mildly dilated.  Right Atrium: The right atrium is mildly dilated.  Pericardium: There is no evidence of pericardial effusion.  Mitral Valve: Mild mitral valve regurgitation is seen. No mitral   vegetation.  Tricuspid Valve: Mild tricuspid regurgitation is visualized.  Aortic Valve: The aortic valve is bicuspid with doming. Mild aortic valve     sclerosis is present, with no evidence of aortic valve stenosis. Trivial   aortic valve regurgitation is seen. Aaortic valve bicuspid with   thickineing and cannot rule out very small vegetaion but appears only to   be thickening.  Aorta: The aortic root and ascending aorta are structurally normal, with   no evidence of dilitation.    Summary:   1. Left  ventricular ejection fraction, by visual estimation, is 60 to   65%.   2. Mildly dilated left atrium.   3. Mildly dilated right atrium.   4. Mild mitral valve regurgitation.   5. Aortic valve is bicuspid with doming.   6. Mild aortic valve sclerosis without stenosis.   7. Mild tricuspid regurgitation.   8. Aaortic valve bicuspid with thickineing and cannot rule out very   small vegetaion but appears only to be thickening.   9. No mitral vegetation.  [id number] Serafina Royals MD  Electronicallysigned by [id number] Serafina Royals MD  Signature Date/Time: 07/21/2014/5:04:44 PM  *** Final ***    IMPRESSION: .        Verified By: Corey Skains  ([facility]), M.D., MD   Assessment/Plan:  Assessment/Plan:  Assessment atrial fibrillation cardioverted to normal sinus rhythm  COPD  bronchitis  obesity  obstructive sleep apnea  edema  cellulitis  bacteremia  hypoxemia .   Plan status post electrical cardioversion  continue inhalers  recommend long-term anticoagulation either with Coumadin or Xarelto  recommend weight loss exercise portion control  continue antibiotics for cellulitis and bacteremia  wean oxygen  recommend weight loss exercise portion control  outpatient follow-up with pulmonary as an outpatient  continue rate control for AFib  continue amiodarone 200 mg a day for has antiarrhythmic  follow-up with primary cardiologist in 1-2 weeks   Electronic Signatures: Lujean Amel D (MD)  (Signed 09-Feb-16 11:49)  Authored: Chief Complaint, VITAL SIGNS/ANCILLARY NOTES, Brief Assessment, Lab Results, Radiology Results, Assessment/Plan   Last Updated: 09-Feb-16 11:49 by Lujean Amel D (MD)

## 2014-10-15 NOTE — Discharge Summary (Signed)
PATIENT NAME:  Marcus Williams, Marcus Williams MR#:  [id number] DATE OF BIRTH:  1956/01/30  DATE OF ADMISSION:  07/19/2014 DATE OF DISCHARGE:  07/26/2014  REASON FOR ADMISSION: Chest pain with shortness of breath.   HISTORY OF PRESENT ILLNESS: Please see the dictated HPI done by Dr. Lavetta Nielsen on 07/19/2014.   PAST MEDICAL HISTORY: 1. ASCVD, status post CABG.  2. Type 2 diabetes requiring insulin.  3. Peripheral neuropathy.  4. Hyperlipidemia.  5. GE reflux disease.  6. History of SVT.  7. Benign hypertension.    MEDICATIONS ON ADMISSION: Please see admission note.   ALLERGIES: No known drug allergies.   SOCIAL HISTORY: As per admission note.   FAMILY HISTORY: As per admission note.  REVIEW OF SYSTEMS: As per admission note.   PHYSICAL EXAMINATION:  GENERAL: The patient was critically ill-appearing.  VITAL SIGNS: Remarkable for a blood pressure 169/83, with a heart rate of 157, and a temperature of 100.7, with a saturation of 99% on supplemental oxygen.  HEENT: Unremarkable.  NECK: Supple, without JVD.  LUNGS: Reveal diffuse wheezes and tachypnea.   CARDIAC: Irregularly irregular rhythm with a rapid rate.  ABDOMEN: Soft and nontender.  EXTREMITIES: Without edema.  NEUROLOGIC: Grossly nonfocal.   HOSPITAL COURSE: The patient was admitted with rapid atrial fibrillation and elevated troponin, consistent with an acute myocardial infarction. He was also noted to be septic. He was seen in consultation by cardiology and infectious disease. The source of his sepsis was a lower extremity cellulitis. He was maintained on IV antibiotics. Cultures were sent. The patient remained in rapid atrial fibrillation. He was initially on a Cardizem drip, as well as amiodarone drips. This improved with time, but he continued to be in atrial fibrillation. He subsequently underwent cardioversion with conversion to a normal sinus rhythm. His oxygenation improved. He was switched to oral antibiotics. His sepsis resolved. By  07/26/2014, the patient was stable and ready for discharge.   DISCHARGE DIAGNOSES: 1. Acute myocardial infarction.  2. Rapid atrial fibrillation, now status post cardioversion.  3. Sepsis.  4. Lower extremity cellulitis.  5. Type 2 diabetes.  6. Atherosclerotic cardiovascular disease, status post coronary artery bypass grafting.  7. Obesity.  8. Peripheral neuropathy.   DISCHARGE MEDICATIONS: 1. Nitrostat  0.4 mg sublingually p.r.n. chest pain.  2. Norco 5/325, 1 p.o. q. 4 hours  p.r.n. pain.  3. Metformin 1000 mg p.o. b.i.d.  4. Amaryl 4 mg p.o. daily.  5. Mirapex 0.5 mg p.o. at bedtime.  6. Trazodone 100 mg p.o. at bedtime.  7. Simvastatin 40 mg p.o. at bedtime.  8. Nitro-Dur 0.6 mg topically daily.  9. Lyrica 75 mg p.o. b.i.d.  10. Aspirin 325 mg p.o. daily.  11. Lopressor 100 mg p.o. b.i.d.  12. Cardizem CD 300 mg p.o. daily.  13. Protonix 40 mg p.o. daily.  14. Lasix 40 mg p.o. daily.  15. Amiodarone 200 mg p.o. daily.  16. Xarelto 15 mg p.o. daily.  17. Levemir 20 units subcutaneous b.i.d.  18. Lorazepam 1 mg p.o. q. 4 hours p.r.n. anxiety.  19. Symbicort 160/4.5, 2 puffs b.i.d.  20. Tussionex 1 tsp every 12 hours as needed.  21. Amoxicillin 500 mg 2 capsules every 12 hours x 10 days.  22. Singulair 10 mg p.o. daily.  23. Mucinex 600 mg p.o. b.i.d. 24. Oxygen at 2 liters per minute per nasal cannula.   FOLLOWUP PLANS AND APPOINTMENTS: The patient was discharged with home health on oxygen. He is on a low-sodium, carbohydrate -controlled diet.  He will follow up with me within one week's time, sooner if needed.    ____________________________ Leonie Douglas Doy Hutching, MD jds:mw D: 08/05/2014 10:47:13 ET T: 08/05/2014 14:18:53 ET JOB#: [id number]  cc: Leonie Douglas. Doy Hutching, MD, <Dictator> Clinton Wahlberg Lennice Sites MD ELECTRONICALLY SIGNED 08/05/2014 18:33

## 2014-10-15 NOTE — Consult Note (Signed)
PATIENT NAME:  Marcus Williams, NIN MR#:  [id number] DATE OF BIRTH:  12-Jan-1956  DATE OF CONSULTATION:  07/01/2014  CONSULTING PHYSICIAN:   Valentino Nose, MD  CONSULTED PHYSICIAN:  Corey Skains, MD  REASON FOR CONSULTATION: Supraventricular tachycardia with diabetes, coronary artery disease, and hyperlipidemia.   CHIEF COMPLAINT: "I had some chest pain and palpitations."   HISTORY OF PRESENT ILLNESS:  This is a 59 year old male with known coronary disease status post coronary artery bypass graft, diabetes, hypertension, hyperlipidemia, who has had new onset of palpitations and irregular heartbeat, chest pressure. He was seen in the Emergency Room with an EKG showing ectopic atrial rhythm with tachycardia of 140 beats per minute and was given the appropriate medications and now it is 110 beats per minute, but unchanged. The patient has had improvements of his chest pressure and pain more consistent with supraventricular tachycardia, rather than acute myocardial infarction, and an elevation of troponin is 0.16, more consistent with demand ischemia. The patient has had known coronary artery disease in the past, but has not had any recent concerns of cardiovascular issues with a recent stress test and echocardiogram, which appeared to be normal.  Diabetes has been stable and he has had essential hypertension and mixed hyperlipidemia treatment with excellent result.   REVIEW OF SYSTEMS: The remainder of review of systems negative for vision change, ringing in the ears, hearing loss, cough, congestion, heartburn, nausea, vomiting, diarrhea, bloody stool, stomach pain, extremity pain, leg weakness, cramping of the buttocks, known blood clots, headaches, blackouts, dizzy spells, nosebleeds, congestion, trouble swallowing, frequent urination,   muscle weakness, numbness, anxiety, depression, skin lesions or skin rashes.   PAST MEDICAL HISTORY:  1.  Coronary artery disease.  2.  Diabetes mellitus.  3.   Hyperlipidemia.   FAMILY HISTORY: Multiple family members with hypertension, diabetes and coronary artery disease.   SOCIAL HISTORY: Currently denies alcohol or tobacco use.   ALLERGIES: As listed.   MEDICATIONS: As listed.   PHYSICAL EXAMINATION:  VITAL SIGNS: Blood pressure is 110/68 bilaterally. Heart rate is 120 and slightly irregular.  GENERAL: He is a well-appearing large male in no acute distress.  HEENT: No icterus, thyromegaly, ulcers, hemorrhage, or xanthelasma.  CARDIOVASCULAR: Slightly irregular with normal S1 and S2. Distant heart sounds. No apparent PMI cannot be assessed.  LUNGS:  Clear to auscultation with normal respirations. ABDOMEN: Soft, nontender. Cannot assess hepatosplenomegaly due to increased abdominal girth.  EXTREMITIES: 2+ radial, trace femoral and dorsal pedal pulses, with trace lower extremity edema.  No cyanosis, clubbing or ulcers.  NEUROLOGIC: He is oriented to time, place, and person, with normal mood and affect.   ASSESSMENT: A 59 year old male with known coronary artery disease status post coronary artery bypass graft with elevated troponin consistent with demand ischemia rather than acute myocardial infarction, diabetes, mixed hyperlipidemia and essential hypertension with supraventricular tachycardia consistent with ectopic atrial rhythm, needing further treatment options.   RECOMMENDATIONS:  1.  Continue beta blocker, increasing dose for better heart rate control and possible spontaneous conversion to normal sinus rhythm.  2.  Consider anticoagulation for further risk reduction in stroke with unknown rhythm at this time until further ability to evaluate rhythm.  3.  No further cardiac intervention of minimal elevation of troponin consistent with demand ischemia rather than acute coronary syndrome.  4.  Diabetes mellitus continuation of treatment with a goal hemoglobin A1c below 7.  5.  Hypertension control with ACE inhibitor for renal protection. 6.   High intensity cholesterol therapy for  mixed hyperlipidemia.  7.  Further consideration of echocardiogram if necessary for LV systolic dysfunction causing above.     ____________________________ Corey Skains, MD bjk:DT D: 07/01/2014 09:07:43 ET T: 07/01/2014 12:54:04 ET JOB#: [id number]  cc: Corey Skains, MD, <Dictator> Corey Skains MD ELECTRONICALLY SIGNED 07/03/2014 13:28

## 2014-10-15 NOTE — Consult Note (Signed)
Chief Complaint:  Subjective/Chief Complaint Preop for cardioversion shortness of breath is better today denies any pain no leg swelling no fever chills or sweats   VITAL SIGNS/ANCILLARY NOTES: **Vital Signs.:   08-Feb-16 11:18  Vital Signs Type Routine  Temperature Temperature (F) 97.5  Celsius 36.3  Temperature Source oral  Pulse Pulse 82  Respirations Respirations 20  Systolic BP Systolic BP 379  Diastolic BP (mmHg) Diastolic BP (mmHg) 77  Mean BP 104  Pulse Ox % Pulse Ox % 93  Pulse Ox Activity Level  At rest  Oxygen Delivery 2L  *Intake and Output.:   08-Feb-16 10:35  Grand Totals Intake:   Output:  600    Net:  -600 24 Hr.:  -600  Urine ml     Out:  600  Urinary Method  Void; Urinal   Brief Assessment:  GEN well developed, well nourished, no acute distress   Cardiac Irregular  murmur present  + LE edema   Respiratory normal resp effort  clear BS  rhonchi   Gastrointestinal Normal   Gastrointestinal details normal Soft   EXTR negative edema   Lab Results: Hepatic:  07-Feb-16 04:36   Bilirubin, Total 0.9  Alkaline Phosphatase 63  SGPT (ALT) 44  SGOT (AST) 33  Total Protein, Serum 7.1  Albumin, Serum  2.7  Routine Chem:  07-Feb-16 04:36   Glucose, Serum  203  BUN 18  Creatinine (comp) 1.13  Sodium, Serum 136  Potassium, Serum 4.1  Chloride, Serum 103  CO2, Serum 25  Calcium (Total), Serum  8.2  Anion Gap 8  Osmolality (calc) 280  eGFR (African American) >60  eGFR (Non-African American) >60 (eGFR values <42m/min/1.73 m2 may be an indication of chronic kidney disease (CKD). Calculated eGFR, using the MRDR Study equation, is useful in  patients with stable renal function. The eGFR calculation will not be reliable in acutely ill patients when serum creatinine is changing rapidly. It is not useful in patients on dialysis. The eGFR calculation may not be applicable to patients at the low and high extremes of body sizes, pregnant women, and  vegetarians.)  Magnesium, Serum 2.1 (1.8-2.4 THERAPEUTIC RANGE: 4-7 mg/dL TOXIC: > 10 mg/dL  -----------------------)  Routine Hem:  07-Feb-16 04:36   WBC (CBC) 7.8  RBC (CBC)  3.85  Hemoglobin (CBC)  10.7  Hematocrit (CBC)  32.6  Platelet Count (CBC)  139  MCV 85  MCH 27.8  MCHC 32.8  RDW  14.8  Neutrophil % 75.1  Lymphocyte % 13.8  Monocyte % 8.7  Eosinophil % 1.5  Basophil % 0.9  Neutrophil # 5.9  Lymphocyte # 1.1  Monocyte # 0.7  Eosinophil # 0.1  Basophil # 0.1 (Result(s) reported on 23 Jul 2014 at 05:16AM.)   Radiology Results: XRay:    03-Feb-16 20:31, Chest Portable Single View  Chest Portable Single View   REASON FOR EXAM:    chest pain  COMMENTS:       PROCEDURE: DXR - DXR PORTABLE CHEST SINGLE VIEW  - Jul 19 2014  8:31PM     CLINICAL DATA:  59year old male with chest pain and recent  diagnosis of pneumonia    EXAM:  PORTABLE CHEST - 1 VIEW    COMPARISON:  Prior chest x-ray 06/30/2014 ; CT scan of the chest  performed earlier today.    FINDINGS:  Stable cardiac and mediastinal contours. There is mild cardiomegaly.  Patient is status post median sternotomy with evidence of prior  multivessel CABG.  Linear opacity in the left mid lung consistent with atelectasis as  seen on the recent CT scan. Additionally, there is faint linear  atelectasis versus scarring along the right minor fissure. No  pleural effusion or pneumothorax. No pulmonary edema. No acute  osseous abnormality.     IMPRESSION:  Stable chest x-ray with linear atelectasis versus scarring in the  anterior left upper lobe and along the right minor fissure.    Stable cardiomegaly.  Electronically Signed    By: Jacqulynn Cadet M.D.    On: 07/19/2014 20:54         Verified By: Criselda Peaches, M.D.,    05-Feb-16 07:29, Chest Portable Single View  Chest Portable Single View   REASON FOR EXAM:    sepsis  COMMENTS:       PROCEDURE: DXR - DXR PORTABLE CHEST SINGLE VIEW  -  Jul 21 2014  7:29AM     CLINICAL DATA:  Sepsis.  AFib.  Shortness of breath.    EXAM:  PORTABLE CHEST - 1 VIEW    COMPARISON:  None.    FINDINGS:  Mediastinum and hilar structures normal. Cardiomegaly. Prior CABG.  Normal pulmonary vascularity. Interim partial clearing of left mid  lung and right base infiltrates. No pleural effusion or  pneumothorax.     IMPRESSION:  1. Interim partial clearing of leftmid lung and right base  pulmonary infiltrates.    2. Stable cardiomegaly. Prior CABG. No pulmonary venous congestion.      Electronically Signed    By: Marcello Moores  Register    On: 07/21/2014 07:33       Verified By: Osa Craver, M.D., MD    07-Feb-16 08:19, Chest Portable Single View  Chest Portable Single View   REASON FOR EXAM:    SOB  COMMENTS:       PROCEDURE: DXR - DXR PORTABLE CHEST SINGLE VIEW  - Jul 23 2014  8:19AM     CLINICAL DATA:  Fall, left chest pain, history of pneumonia    EXAM:  PORTABLE CHEST - 1 VIEW    COMPARISON:  07/21/2014    FINDINGS:  Increasing right upper lobe opacity, suspicious for pneumonia.  Cardiomegaly with pulmonary vascular congestion. No frank  interstitial edema. No pneumothorax.    Postsurgical changes related to prior CABG.     IMPRESSION:  Increasing right upper lobe opacity, suspicious for pneumonia.      Electronically Signed    By: Julian Hy M.D.    On: 07/23/2014 10:19         Verified By: Julian Hy, M.D.,    09-Feb-16 08:05, Chest PA and Lateral  Chest PA and Lateral   REASON FOR EXAM:    SOB  COMMENTS:       PROCEDURE: DXR - DXR CHEST PA (OR AP) AND LATERAL  - Jul 25 2014  8:05AM     CLINICAL DATA:  59 year old male with shortness of breath    EXAM:  CHEST  2 VIEW    COMPARISON:  Prior chest x-ray 07/23/2014    FINDINGS:  Stable cardiomegaly and mediastinal contours. Patient is status post  median sternotomy with evidence of prior multivessel CABG. Interval  progression of  diffuse bilateral interstitial and airspace  opacities. The appearance is most suggestive of a pulmonary edema.  The underlying right upper lobe opacity seen on the prior radiograph  is now largely obscured by the superimposed edema. Small bilateral  pleural effusions and associated bibasilar  atelectasis. No  pneumothorax.     IMPRESSION:  1. Interval development of pulmonary edema suggesting either CHF or  volume overload.  2. Small bilateral layering pleural effusions.  3. Previously noted increasing right upper lobe opacities in a  largely obscured by the superimposed edema.    Electronically Signed    By: Jacqulynn Cadet M.D.    On: 07/25/2014 08:11         Verified By: Criselda Peaches, M.D.,  Cardiology:    03-Feb-16 19:50, ED ECG  Ventricular Rate 154  Atrial Rate 156  QRS Duration 92  QT 286  QTc 458  R Axis -16  T Axis 130  ECG interpretation   Supraventricular tachycardia  Incomplete right bundle branch block  Left ventricular hypertrophy with repolarization abnormality  Abnormal ECG  When compared with ECG of 02-Jul-2014 08:54,  Borderline criteria for Inferior infarct are no longer Present  ST now depressed in Anterior leads  ----------unconfirmed----------  Confirmed by OVERREAD, NOT (100), editor PEARSON, BARBARA ([id number]) on 07/20/2014 9:46:43 AM  ED ECG     03-Feb-16 20:14, ECG  Ventricular Rate 155  Atrial Rate 163  QRS Duration 94  QT 312  QTc 501  R Axis -16  T Axis 125  ECG interpretation   Supraventricular tachycardia  Incomplete right bundle branch block  Left ventricular hypertrophy with repolarization abnormality  Nonspecific ST abnormality  Abnormal ECG  When compared with ECG of 02-Jul-2014 08:54,  Borderline criteria for Inferiorinfarct are no longer Present  ----------unconfirmed----------  Confirmed by OVERREAD, NOT (100), editor PEARSON, BARBARA (32) on 07/20/2014 9:46:51 AM  ECG     03-Feb-16 20:18, ECG  Ventricular Rate 120   Atrial Rate 312  QRS Duration 108  QT 288  QTc 407  R Axis -12  T Axis 126  ECG interpretation   Atrial flutter with variable A-V block  Incomplete right bundle branch block  Left ventricular hypertrophy with repolarization abnormality  Abnormal ECG  When compared with ECG of 19-Jul-2014 20:17,  No significant change was found  ----------unconfirmed----------  Confirmed by OVERREAD, NOT (100), editor PEARSON, BARBARA ([id number]) on 07/20/2014 9:46:55 AM  ECG     04-Feb-16 12:54, Echo Doppler  Echo Doppler   REASON FOR EXAM:      COMMENTS:       PROCEDURE: [facility] - ECHO DOPPLER COMPLETE(TRANSTHOR)  - Jul 20 2014 12:54PM     RESULT: Echocardiogram Report    Patient Name:   Marcus Williams Date of Exam: 07/20/2014  Medical Rec #:  [id number]       Custom1:  Date of Birth:  July 07, 1955    Height:       69.0 in  Patient Age:    23 years     Weight:       307.0 lb  Patient Gender: M            BSA:          2.48 m??    Indications: MI  Sonographer:    Sherrie Sport RDCS  Referring Phys: Doy Hutching, JEFFREY, D    Sonographer Comments: Technically difficult study due to poor echo   windows and suboptimal apical window.    Summary:   1. Left ventricular ejection fraction, by visual estimation, is 45 to   50%.   2. Mildly decreased global left ventricular systolic function.   3. Impaired relaxation pattern of LV diastolic filling.   4. Moderate concentric left ventricular hypertrophy.  5. Moderately increased left ventricular septal thickness.   6. Mildly dilated left atrium.   7. Mildly dilated right atrium.   8. Mild mitral valve regurgitation.   9. Mild to moderate tricuspid regurgitation.  10. Moderately increased left ventricular posterior wall thickness.  2D AND M-MODE MEASUREMENTS (normal ranges within parentheses):  Left Ventricle:          Normal  IVSd (2D):    1.88 cm (0.7-1.1)  LVPWd (2D):     1.67 cm (0.7-1.1) Aorta/LA:                  Normal  LVIDd (2D):     4.77 cm (3.4-5.7) Aortic  Root (2D): 3.10 cm (2.4-3.7)  LVIDs (2D):     3.46 cm           Left Atrium (2D): 4.90 cm (1.9-4.0)  LV FS (2D):     27.5 %   (>25%)  LV EF (2D):     53.3 %   (>50%)                                    Right Ventricle:                                    RVd (2D):        3.53 cm  LV DIASTOLIC FUNCTION:  MV Peak E: 1.29 m/s E/e' Ratio: 9.80                      Decel Time: 158 msec  SPECTRAL DOPPLER ANALYSIS (where applicable):  Mitral Valve:  MV P1/2 Time: 45.82 msec  MV Area, PHT: 4.80 cm??  Aortic Valve: AoV Max Vel: 2.27 m/s AoV Peak PG: 20.6 mmHg AoV Mean PG:   12.7 mmHg  LVOT Vmax: 0.81 m/s LVOT VTI: 0.151 m LVOT Diameter: 2.10 cm  AoV Area, Vmax: 1.24 cm?? AoV Area, VTI: 1.62 cm?? AoV Area, Vmn: 1.25 cm??  Tricuspid Valve and PA/RV Systolic Pressure: TR Max Velocity: 1.95 m/s RA   Pressure: 5 mmHg RVSP/PASP: 20.2 mmHg  Pulmonic Valve:  PV Max Velocity: 0.81 m/s PV Max PG: 2.6 mmHg PV Mean PG:    PHYSICIAN INTERPRETATION:  Left Ventricle: Not well seen. The left ventricular internal cavity size     was normal. LV septal wall thickness was moderately increased. LV   posterior wall thickness was moderately increased. Moderate concentric   left ventricular hypertrophy. Global LV systolic function was mildly   decreased. Left ventricular ejection fraction, by visual estimation, is   45 to 50%. Spectral Doppler shows impaired relaxation pattern of LV   diastolic filling.  Right Ventricle: The right ventricular size is normal. Global RV systolic   function is low normal.  Left Atrium: The left atrium is mildly dilated.  Right Atrium: The right atrium is mildly dilated.  Pericardium: There is no evidenceof pericardial effusion.  Mitral Valve: The mitral valve is normal in structure. Mild mitral valve   regurgitation is seen.  Tricuspid Valve: The tricuspid valve is normal. Mild to moderate   tricuspid regurgitation is visualized. The tricuspid regurgitant velocity     is 1.95  m/s, and with an assumed right atrial pressure of 5 mmHg, the   estimated right ventricular systolic pressure is normal at 20.2 mmHg.  Aortic Valve: The  aortic valve is normal. No evidence of aortic valve   regurgitation isseen.  Pulmonic Valve: The pulmonic valve is normal. No indication of pulmonic   valve regurgitation.    Ramos MD  Electronically signed by Meridian Lujean Amel MD  Signature Date/Time: 07/21/2014/8:03:35 AM    *** Final ***    IMPRESSION: .    Verified By: Yolonda Kida, M.D., MD    05-Feb-16 13:52, TEE  TEE   REASON FOR EXAM:      COMMENTS:       PROCEDURE: [facility] - ECHO TRANSESOPHAGEAL  - Jul 21 2014  1:52PM     RESULT: Transesophageal Echocardiogram Report    Patient Name:   Marcus Williams Date of Exam: 07/21/2014  Medical Rec #:  [id number]       Custom1:  Date of Birth:  [birth year]    Height:  Patient Age:    59 years     Weight:  Patient Gender: M            BSA:    Indications: Endocarditis  Sonographer:    Arville Go RDCS  Referring Phys: SPARKS, JEFFREY, D  SPECTRAL DOPPLER ANALYSIS (where applicable):    PHYSICIAN INTERPRETATION:  Left Ventricle: Left ventricular ejection fraction, by visual estimation,   is 60 to 65%.  Right Ventricle: Normal right ventricular size, wall thickness, and   systolic function. RV wall thickness is normal.  Left Atrium: The left atrium is mildly dilated.  Right Atrium: The right atrium is mildly dilated.  Pericardium: There is no evidence of pericardial effusion.  Mitral Valve: Mild mitral valve regurgitation is seen. No mitral   vegetation.  Tricuspid Valve: Mild tricuspid regurgitation is visualized.  Aortic Valve: The aortic valve is bicuspid with doming. Mild aortic valve     sclerosis is present, with no evidence of aortic valve stenosis. Trivial   aortic valve regurgitation is seen. Aaortic valve bicuspid with   thickineing and cannot rule out very small vegetaion but appears only to   be  thickening.  Aorta: The aortic root and ascending aorta are structurally normal, with   no evidence of dilitation.    Summary:   1. Left ventricular ejection fraction, by visual estimation, is 60 to   65%.   2. Mildly dilated left atrium.   3. Mildly dilated right atrium.   4. Mild mitral valve regurgitation.   5. Aortic valve is bicuspid with doming.   6. Mild aortic valve sclerosis without stenosis.   7. Mild tricuspid regurgitation.   8. Aaortic valve bicuspid with thickineing and cannot rule out very   small vegetaion but appears only to be thickening.   9. No mitral vegetation.  [id number] Serafina Royals MD  Electronically signed by [id number] Serafina Royals MD  Signature Date/Time: 07/21/2014/5:04:44 PM  *** Final ***    IMPRESSION: .        Verified By: Corey Skains  ([facility]), M.D., MD   Assessment/Plan:  Assessment/Plan:  Assessment shortness of breath atrial fibrillation RVR  obesity  respiratory failure  bacteremia/ cellulitis  COPD  status post TEE  hypertension  obstructive sleep apnea  diabetes .   Plan preop  for cardioversion electrically  today continue amiodarone load then and p.o. amiodarone 200 a day  switch to p.o. Cardizem 240 a day  continue anticoagulation  recommend add digoxin for rate control  continue low-dose beta-blockers  agree with broad-spectrum antibiotics for bacteremia  continue diabetes management control  continue inhalers and treatment for bronchitis  consider pulmonary improve for history failure  obstructive sleep apnea therapy  transferred to telemetry   Electronic Signatures: Yolonda Kida (MD)  (Signed 09-Feb-16 11:54)  Authored: Chief Complaint, VITAL SIGNS/ANCILLARY NOTES, Brief Assessment, Lab Results, Radiology Results, Assessment/Plan   Last Updated: 09-Feb-16 11:54 by Lujean Amel D (MD)

## 2014-10-16 NOTE — H&P (Addendum)
PATIENT NAME:  Marcus Williams, Marcus Williams MR#:  [id number] DATE OF BIRTH:  02/02/1956  DATE OF ADMISSION:  10/14/2014  PRIMARY DOCTOR: Leonie Douglas. Doy Hutching, MD  ER PHYSICIAN: Debbrah Alar, MD  CHIEF COMPLAINT: Shortness of breath.   HISTORY OF PRESENT ILLNESS: The patient is a 59 year old male patient with history of atrial fibrillation, cardioverted yesterday by Dr. Ubaldo Glassing. Comes in today because of shortness of breath. The patient was at [facility] yesterday. He had a cardioversion done for symptomatic atrial fibrillation. The patient went home without any symptoms yesterday morning. Since last night, he was having shortness of breath associated with orthopnea, PND, unable to lie down flat. Came to the Emergency Room. O2 saturations were 90% on room air when he came in. X-ray of chest showed congestive heart failure. He did not take his Lasix yesterday. The patient was given 40 mg of IV Lasix, and he says he feels better. Oxygen saturations right now are 99% on 2 liters. The patient denies any chest pain. No cough. No fever, able to talk in full sentences.   PAST MEDICAL HISTORY: Significant for history of type 2 diabetes mellitus, hyperlipidemia, atrial fibrillation, restless leg syndrome, GERD, CAD with history of CABG, and history of neuropathy.   SOCIAL HISTORY: No smoking, no drinking, no drugs.   FAMILY HISTORY: Significant for diabetes and coronary artery disease.   ALLERGIES: No known allergies.   PAST SURGICAL HISTORY: Significant for bypass surgery.   MEDICATIONS AT HOME:  1. Advair Diskus 250/50, 1 puff b.i.d. 2. Albuterol nebulizers 4 times daily.  3. Amiodarone 200 mg p.o. daily.  4. Aspirin 325 mg p.o. daily.  5. Cartia XT 300 mg daily.  6. Flonase 50 mcg 1 spray daily. 7.   8. Amaryl 4 mg p.o. daily.  9. Metoprolol tartrate 100 mg p.o. b.i.d.  10. Metformin 1 gram p.o. b.i.d. 11. Singulair 10 mg p.o. daily.  12. NovoLog 40 units in the morning and 50 units at bedtime.   13. Nitroglycerin sublingual p.r.n. for chest pain.  14. Requip 0.75 mg p.o. daily.  15. Simvastatin 40 mg daily.  16. Xarelto 15 mg p.o. daily.   REVIEW OF SYSTEMS:  CONSTITUTIONAL: No fever. No chills.  EYES: No blurred vision. No double vision.  ENT: No ear pain. No hearing loss. No throat pain. No epistaxis.  RESPIRATORY: Shortness of breath present. No cough.  CARDIOVASCULAR: No chest pain. No palpitations. Does have pedal edema.  GASTROINTESTINAL: No nausea. No vomiting. No diarrhea.  GENITOURINARY: No hematuria or dysuria .  ENDOCRINE: No nocturia.  HEMATOLOGIC: No anemia.  SKIN: No skin rashes.  MUSCULOSKELETAL: No joint pain.  NEUROLOGIC: Has a history of neuropathy but no strokes.  PSYCHIATRIC: No anxiety or depression.   PHYSICAL EXAMINATION: VITAL SIGNS: Temperature 98.7, heart rate 78, blood pressure 167/73, saturation 90% on room air on 1 liter, on 2 liters  99% saturation.  HEAD: Atraumatic, normocephalic.  EYES: Pupils equal, reacting to light.  EARS, NOSE, AND THROAT: No tympanic membrane congestion. No turbinate hypertrophy. No oropharyngeal erythema.  NECK: Supple. No JVD. No carotid bruit.  MOUTH: Mucous membranes are moist. Dentition is intact.   LUNGS: Bilateral basilar crepitations present. No wheezing.  CHEST: Nontender.  CARDIOVASCULAR: S1, S2, regular. The patient has normal sinus rhythm now. No murmurs. No gallops.  ABDOMEN: Soft, nontender, nondistended. Bowel sounds present.  MUSCULOSKELETAL: No swelling. The patient does have 2+ pedal edema up to the knees bilaterally.  NEUROLOGIC: Cranial nerves II through XII are intact.  Power 5/5 upper and lower extremities. DTRs are 2+ bilaterally.  PSYCHIATRIC: Mood and affect are within normal limits.   DIAGNOSTIC DATA: White count is 8.3, hemoglobin 11.7, hematocrit 33.7, platelets 136,000.  Electrolytes: Sodium is 140, potassium 4, chloride 110, bicarbonate 25, BUN 18, creatinine 0.93, glucose 150. BNP is  233.   Chest x-ray shows mild cardiomegaly and pulmonary vascular congestion increased, could reflect  infection or possible pulmonary edema.   The patient's EKG shows sinus rhythm with PACs,   ASSESSMENT AND PLAN: 1.  This is a 59 year old, obese male with history of atrial fibrillation status post cardioversion yesterday, comes in with shortness of breath, orthopnea, paroxysmal nocturnal dyspnea. Has evidence of congestive heart failure on x-ray and clinically. He is going to be admitted to telemetry for congestive heart failure exacerbation. He missed his Lasix yesterday, so we are going to give IV Lasix 60 mg q.8 hours and monitor output and symptoms. Cardiology consult is placed because of his recent cardioversion and congestive heart failure exacerbation.  2.  Atrial fibrillation, chronic atrial fibrillation, right now in sinus rhythm. He is on amiodarone, metoprolol, and Xarelto. I restarted them, and I have consulted cardiology for further management.  3.  Type 2 diabetes mellitus. He is on metformin along with NovoLog. I restarted those medications.  4.  History of coronary artery disease, status post coronary artery bypass graft before. He is on aspirin and statins. The patient's statins will be resumed.  5.  History of restless leg syndrome. Continue Requip.  6.  Chronic obstructive pulmonary disease. The patient he is not wheezing at this time. Resume his home medications.   TIME SPENT: About 55 minutes.    ____________________________ Epifanio Lesches, MD sk:je D: 10/14/2014 08:34:13 ET T: 10/14/2014 09:53:07 ET JOB#: [id number]  cc: Epifanio Lesches, MD, <Dictator> Leonie Douglas. Doy Hutching, MD Javier Docker Ubaldo Glassing, MD Epifanio Lesches MD ELECTRONICALLY SIGNED 10/17/2014 23:34

## 2014-10-16 NOTE — Consult Note (Addendum)
PATIENT NAME:  Marcus Williams, [name] MR#:  [id number] DATE OF BIRTH:  12-Mar-1956  DATE OF CONSULTATION:  10/14/2014  CONSULTING PHYSICIAN:  Isaias Cowman, MD  PRIMARY CARE PHYSICIAN: Fulton Reek, MD  CARDIOLOGIST: Bartholome Bill, MD   CHIEF COMPLAINT: Shortness of breath.   REASON FOR CONSULTATION: Consultation requested for evaluation of acute diastolic congestive heart failure.   HISTORY OF PRESENT ILLNESS: The patient has a history of coronary artery disease, status post CABG, with atrial fibrillation, hypertension, obesity, chronic diastolic congestive heart failure. The patient underwent successful cardioversion yesterday for atrial fibrillation. The patient reports that he forgot to take his morning furosemide prior to cardioversion. The patient went home and over the course of the afternoon and evening developed increased increasing shortness of breath, fluid retention. Presented to [facility] Emergency Room where chest x-ray revealed congestive heart failure. The patient was treated with 40 mg of intravenous furosemide with marked clinical improvement with diuresis. The patient denies chest pain. Initial troponin was less than 0.03. The patient reports that he has near his baseline.   PAST MEDICAL HISTORY: 1. Status post CABG.  2. Atrial fibrillation.  3. Hyperlipidemia.  4. Diabetes.  5. Restless leg syndrome.  6. Peripheral neuropathy.  7. Sleep apnea.   MEDICATIONS: Aspirin 325 mg daily, Xarelto 15 mg daily, amiodarone 200 mg daily, Cartia-XT 300 mg daily, metoprolol tartrate 100 mg b.i.d., simvastatin 40 mg daily, Advair Diskus 250/50 - 1 puff b.i.d., albuterol nebulizers 4 times daily, Amaryl 4 mg daily, metformin 1 gram b.i.d., Singulair 10 mg daily, NovoLog 40 units every a.m. and 50 units at bedtime, Requip 0.75 mg daily.   SOCIAL HISTORY: The patient is married. He resides with his wife. He denies tobacco abuse.   FAMILY HISTORY: Positive for coronary artery disease.   REVIEW  OF SYSTEMS:  CONSTITUTIONAL: No fever or chills.  EYES: No blurry vision.  EARS: No hearing loss.  RESPIRATORY: Shortness of breath as described above.  CARDIOVASCULAR: No chest pain.  GASTROINTESTINAL: No nausea, vomiting, or diarrhea.  GENITOURINARY: No dysuria or hematuria.  ENDOCRINE: No polyuria or polydipsia.  MUSCULOSKELETAL: No arthralgias or myalgias.  NEUROLOGICAL: No focal muscle weakness or numbness.  PSYCHOLOGICAL: No depression or anxiety.   PHYSICAL EXAMINATION: VITAL SIGNS: Blood pressure 173/69, pulse 83, respirations 20, temperature 97.9, pulse oximetry 96%.  HEENT: Pupils equal, reactive to light and accommodation.  NECK: Supple without thyromegaly.  LUNGS: Clear.  HEART: Normal JVP. Normal PMI. Regular rate and rhythm. Normal S1, S2. No appreciable gallop, murmur or rub.  ABDOMEN: Soft and nontender. Pulses were intact bilaterally.  MUSCULOSKELETAL: Normal muscle tone.  NEUROLOGIC: The patient is alert and oriented x 3. Motor and sensory both grossly intact.   IMPRESSION: A 59 year old gentleman with known coronary artery disease, status post coronary artery bypass graft, currently without chest pain with negative troponin. The patient has a persistent atrial fibrillation, status post successful cardioversion, remains in sinus rhythm. The patient missed dose of furosemide, developed fluid retention, probable diastolic congestive heart failure, multifactorial, secondary to obesity, sleep apnea, hypertensive cardiomyopathy, which has improved dramatically after single dose of intravenous furosemide and marked diuresis.   RECOMMENDATIONS: 1. Agree with overall current therapy.  2. Would defer further cardiac diagnostics at this time.  3. Continue diuresis.  4. If the patient continues to improve clinically, may consider discharge later today or tomorrow morning.    ____________________________ Isaias Cowman, MD ap:TT D: 10/14/2014 10:48:43 ET T: 10/14/2014  14:57:23 ET JOB#: [id number]  cc: Isaias Cowman,  MD, <Dictator>

## 2014-10-27 IMAGING — CR DG CHEST 2V
1 series · 2 of 2 positions shown · non-contrast
Comparison: none

REASON FOR EXAM: Chest Pain
COMMENTS:

[Series 1: w chest pa · 0.14mm/px · 2 of 2 slices shown]
[im 1/2]
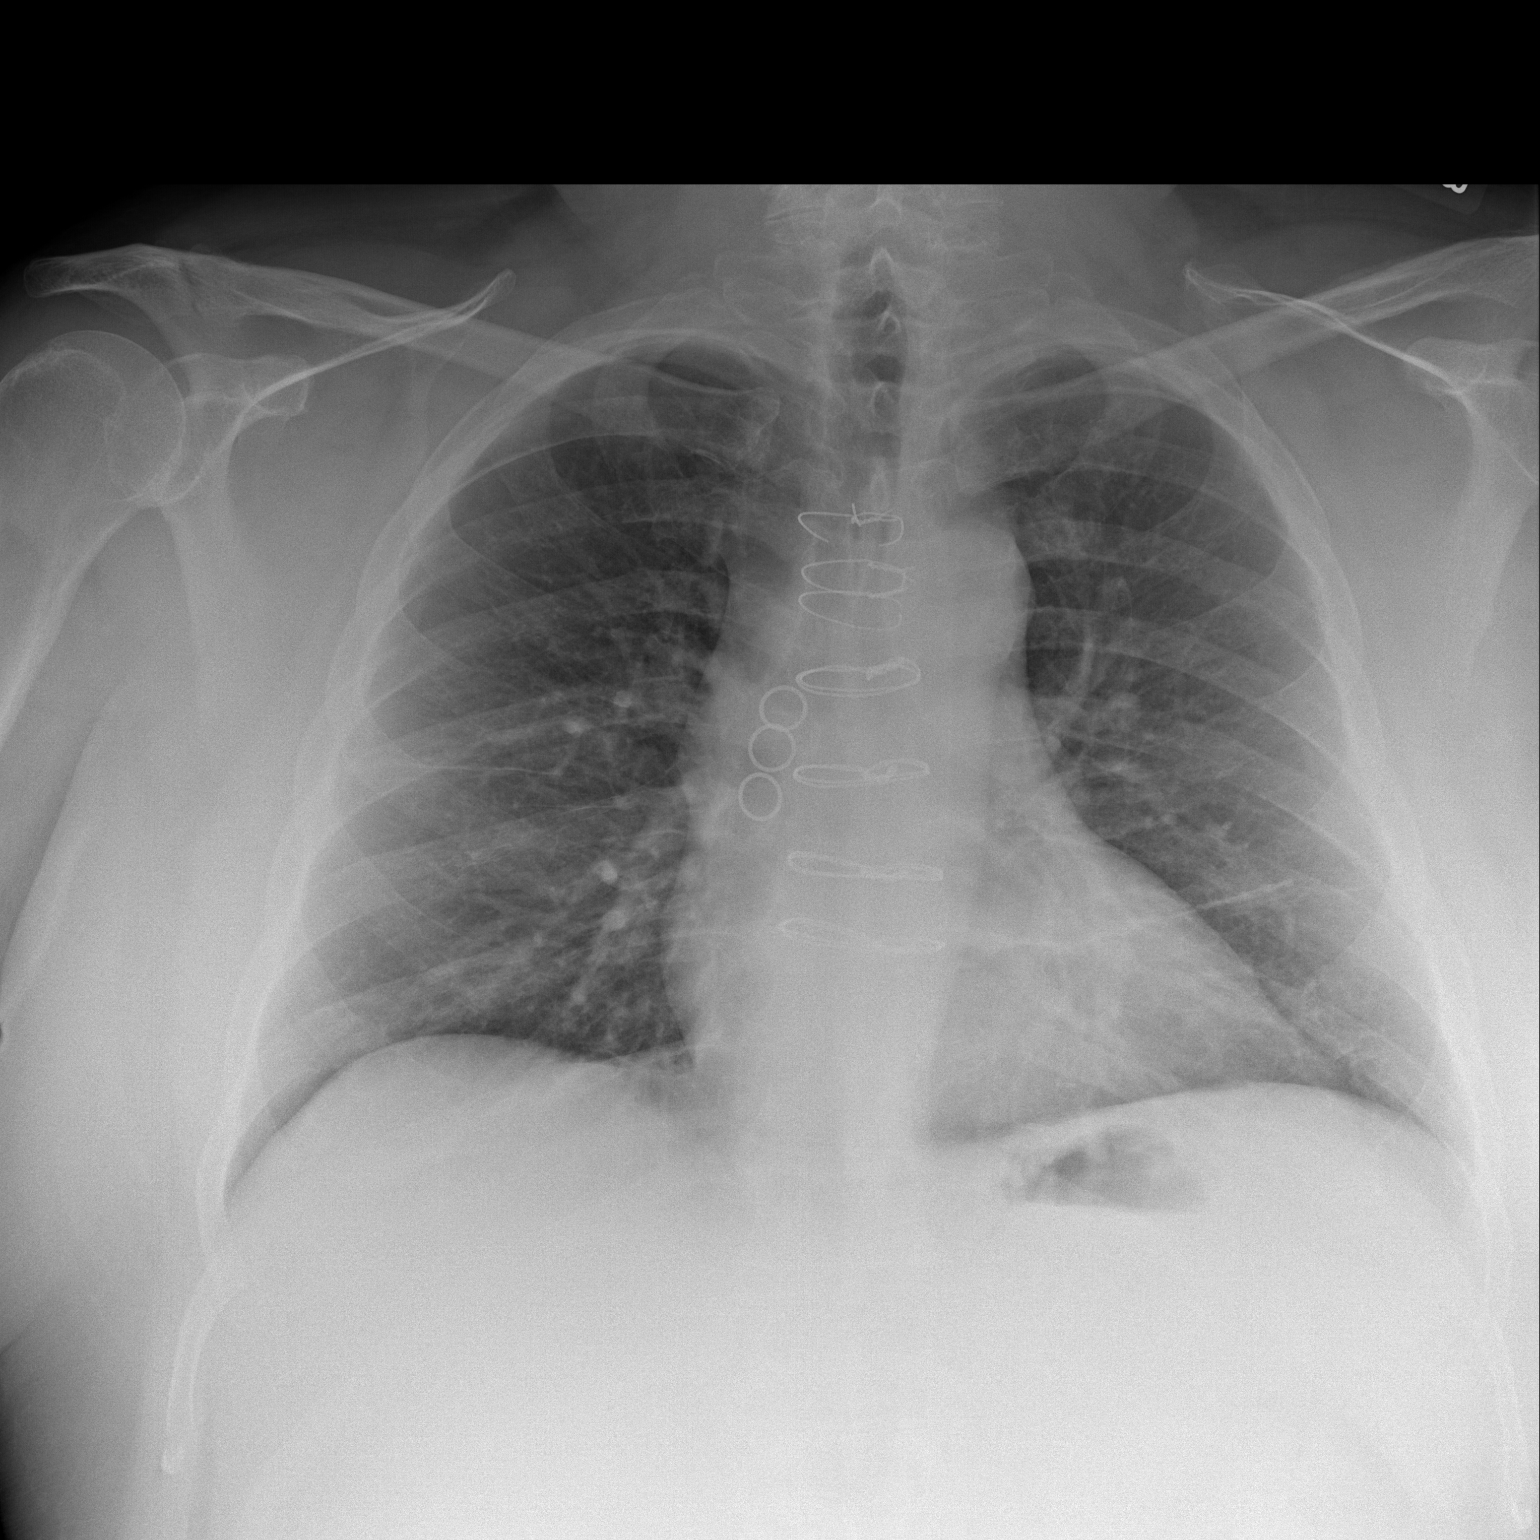
[im 2/2]
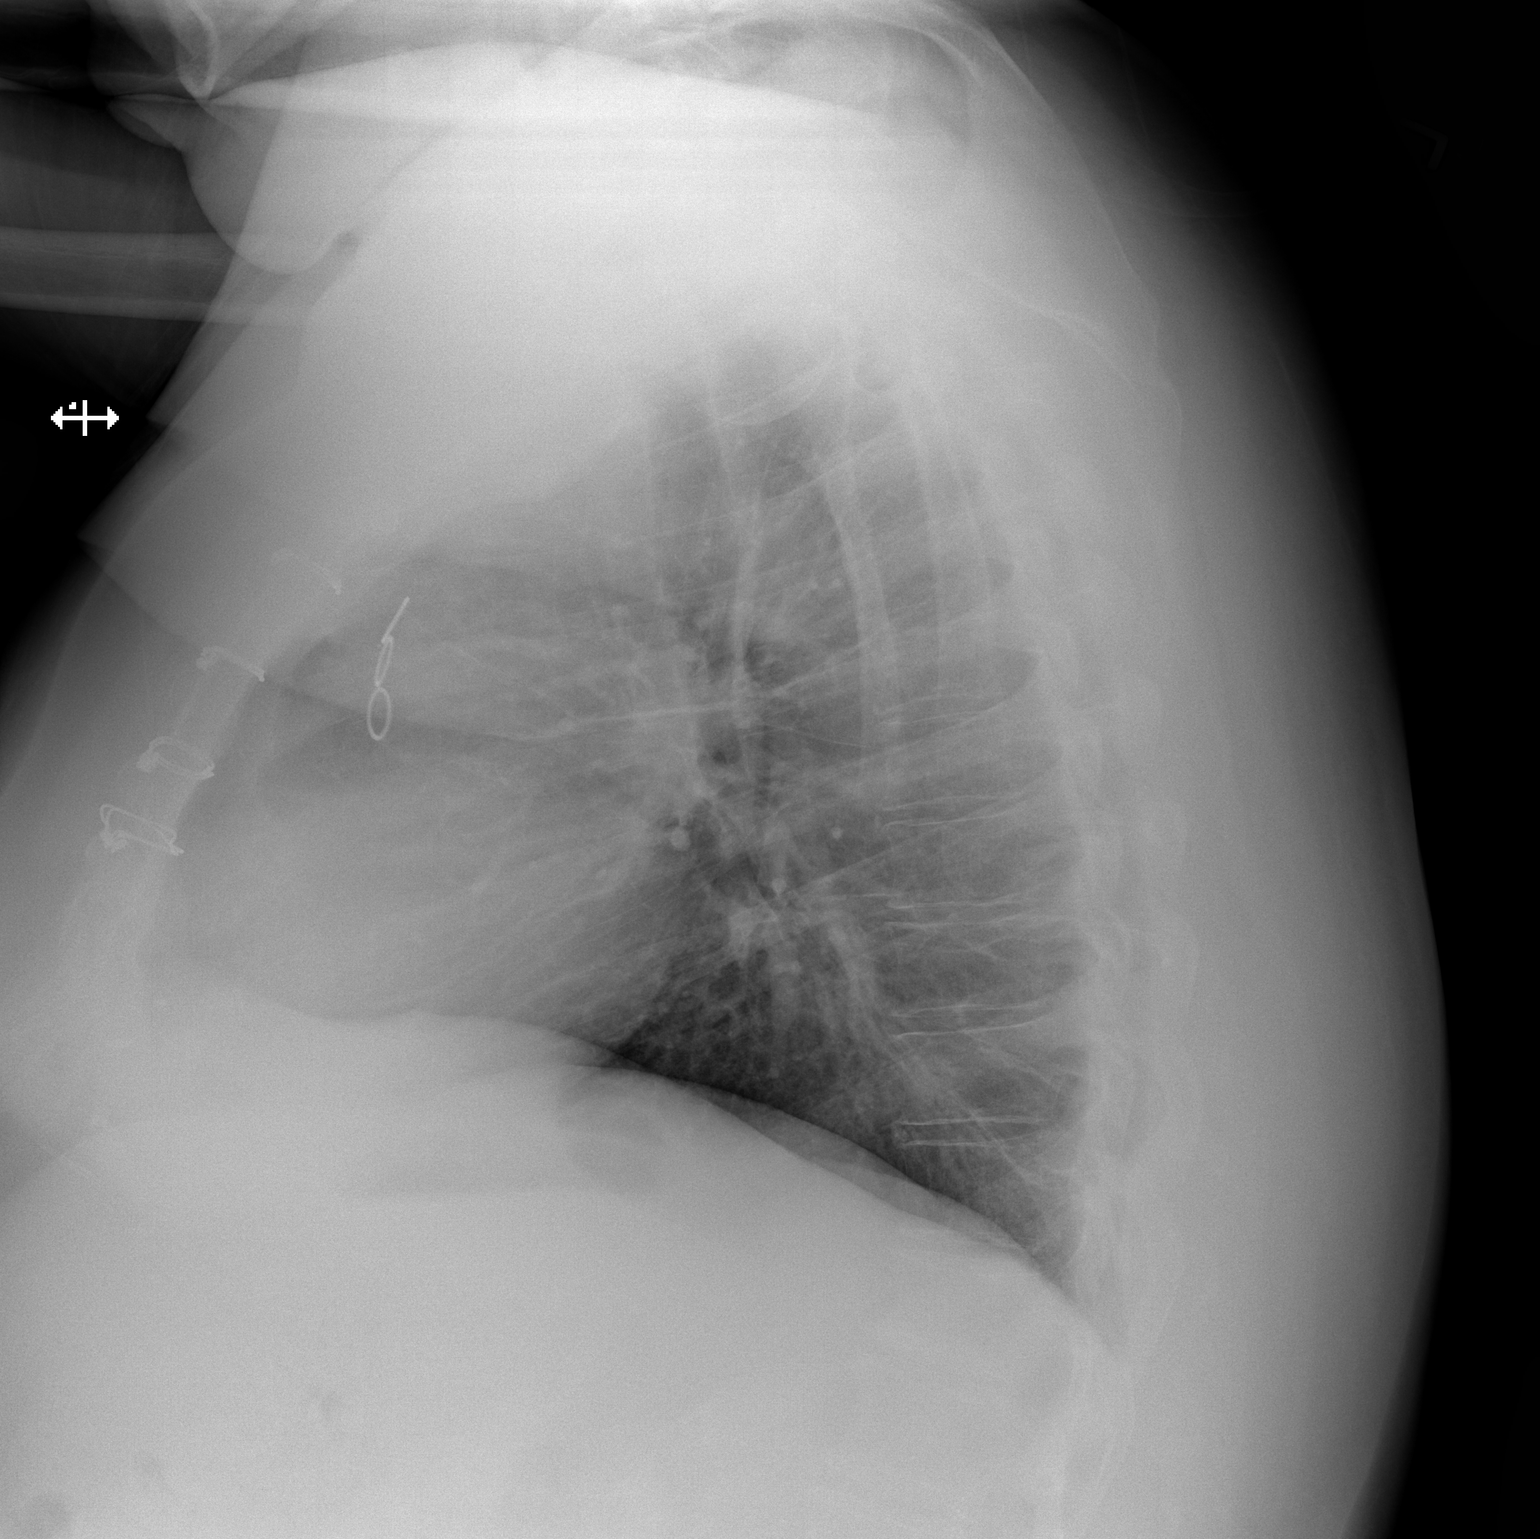

[2 of 2 positions shown; findings below may reference images not displayed]

PROCEDURE:     DXR - DXR CHEST PA (OR AP) AND LATERAL  - March 01, 2013  [DATE]

RESULT:     Comparison is made to the study August 23, 2012.

The lungs are slightly less well inflated today. The interstitial markings
are more prominent. Stable scarring in the left lower hemithorax is present.
The cardiac silhouette is top normal in size. The pulmonary vascularity is
not engorged. The patient has undergone previous CABG. There is no pleural
effusion or pneumothorax.
IMPRESSION: There are mildly increased interstitial markings
bilaterally. This may be of cardiac or noncardiac cause. There is no
alveolar pneumonia nor pulmonary vascular congestion.

[REDACTED]

## 2014-12-10 ENCOUNTER — Encounter: Payer: Self-pay | Admitting: *Deleted

## 2014-12-10 ENCOUNTER — Emergency Department
Admission: EM | Admit: 2014-12-10 | Discharge: 2014-12-10 | Disposition: A | Discharge status: Home / Self Care | Payer: BLUE CROSS/BLUE SHIELD | Attending: Emergency Medicine | Admitting: Emergency Medicine

## 2014-12-10 ENCOUNTER — Emergency Department: Payer: BLUE CROSS/BLUE SHIELD

## 2014-12-10 ENCOUNTER — Other Ambulatory Visit: Payer: Self-pay

## 2014-12-10 DIAGNOSIS — Z7901 Long term (current) use of anticoagulants: Secondary | ICD-10-CM | POA: Insufficient documentation

## 2014-12-10 DIAGNOSIS — Z794 Long term (current) use of insulin: Secondary | ICD-10-CM | POA: Insufficient documentation

## 2014-12-10 DIAGNOSIS — J9801 Acute bronchospasm: Secondary | ICD-10-CM | POA: Diagnosis not present

## 2014-12-10 DIAGNOSIS — Z79899 Other long term (current) drug therapy: Secondary | ICD-10-CM | POA: Diagnosis not present

## 2014-12-10 DIAGNOSIS — G4730 Sleep apnea, unspecified: Secondary | ICD-10-CM | POA: Insufficient documentation

## 2014-12-10 DIAGNOSIS — R0602 Shortness of breath: Secondary | ICD-10-CM | POA: Diagnosis present

## 2014-12-10 DIAGNOSIS — I10 Essential (primary) hypertension: Secondary | ICD-10-CM | POA: Insufficient documentation

## 2014-12-10 DIAGNOSIS — Z7982 Long term (current) use of aspirin: Secondary | ICD-10-CM | POA: Insufficient documentation

## 2014-12-10 DIAGNOSIS — Z7952 Long term (current) use of systemic steroids: Secondary | ICD-10-CM | POA: Insufficient documentation

## 2014-12-10 DIAGNOSIS — E119 Type 2 diabetes mellitus without complications: Secondary | ICD-10-CM | POA: Diagnosis not present

## 2014-12-10 DIAGNOSIS — R0789 Other chest pain: Secondary | ICD-10-CM | POA: Diagnosis not present

## 2014-12-10 DIAGNOSIS — I252 Old myocardial infarction: Secondary | ICD-10-CM | POA: Diagnosis not present

## 2014-12-10 HISTORY — DX: Essential (primary) hypertension: I10

## 2014-12-10 HISTORY — DX: Type 2 diabetes mellitus without complications: E11.9

## 2014-12-10 LAB — CBC
HCT: 36.1 % — ABNORMAL LOW (ref 40.0–52.0)
HEMOGLOBIN: 11.8 g/dL — AB (ref 13.0–18.0)
MCH: 26.7 pg (ref 26.0–34.0)
MCHC: 32.5 g/dL (ref 32.0–36.0)
MCV: 82 fL (ref 80.0–100.0)
Platelets: 146 10*3/uL — ABNORMAL LOW (ref 150–440)
RBC: 4.41 MIL/uL (ref 4.40–5.90)
RDW: 16.8 % — ABNORMAL HIGH (ref 11.5–14.5)
WBC: 7.6 10*3/uL (ref 3.8–10.6)

## 2014-12-10 LAB — BASIC METABOLIC PANEL
ANION GAP: 9 (ref 5–15)
BUN: 26 mg/dL — AB (ref 6–20)
CO2: 24 mmol/L (ref 22–32)
CREATININE: 0.92 mg/dL (ref 0.61–1.24)
Calcium: 8.6 mg/dL — ABNORMAL LOW (ref 8.9–10.3)
Chloride: 105 mmol/L (ref 101–111)
GFR calc non Af Amer: >60 mL/min (ref >60)
GLUCOSE: 217 mg/dL — AB (ref 65–99)
Potassium: 3.7 mmol/L (ref 3.5–5.1)
Sodium: 138 mmol/L (ref 135–145)

## 2014-12-10 LAB — BRAIN NATRIURETIC PEPTIDE: B NATRIURETIC PEPTIDE 5: 169 pg/mL — AB (ref 0.0–100.0)

## 2014-12-10 LAB — TROPONIN I: Troponin I: <0.03 ng/mL (ref <0.031)

## 2014-12-10 MED ORDER — IOHEXOL 350 MG/ML SOLN
100.0000 mL | Freq: Once | INTRAVENOUS | Status: AC | PRN
  Administered 2014-12-10: 100 mL via INTRAVENOUS

## 2014-12-10 MED ORDER — ALBUTEROL SULFATE HFA 108 (90 BASE) MCG/ACT IN AERS: 2.0000 | INHALATION_SPRAY | Freq: Four times a day (QID) | RESPIRATORY_TRACT | Status: DC | PRN

## 2014-12-10 MED ORDER — IPRATROPIUM-ALBUTEROL 0.5-2.5 (3) MG/3ML IN SOLN
3.0000 mL | Freq: Once | RESPIRATORY_TRACT | Status: AC
  Administered 2014-12-10: 3 mL via RESPIRATORY_TRACT

## 2014-12-10 MED ORDER — PREDNISONE 20 MG PO TABS
ORAL_TABLET | ORAL | Status: AC
  Filled 2014-12-10: qty 3

## 2014-12-10 MED ORDER — ACETAMINOPHEN 325 MG PO TABS
650.0000 mg | ORAL_TABLET | Freq: Once | ORAL | Status: AC
  Administered 2014-12-10: 650 mg via ORAL

## 2014-12-10 MED ORDER — PREDNISONE 20 MG PO TABS: 60.0000 mg | ORAL_TABLET | Freq: Once | ORAL | Status: DC

## 2014-12-10 MED ORDER — PREDNISONE 20 MG PO TABS
60.0000 mg | ORAL_TABLET | Freq: Once | ORAL | Status: AC
  Administered 2014-12-10: 60 mg via ORAL

## 2014-12-10 MED ORDER — ACETAMINOPHEN 325 MG PO TABS
ORAL_TABLET | ORAL | Status: AC
  Filled 2014-12-10: qty 2

## 2014-12-10 MED ORDER — IPRATROPIUM-ALBUTEROL 0.5-2.5 (3) MG/3ML IN SOLN
RESPIRATORY_TRACT | Status: AC
  Administered 2014-12-10: 3 mL via RESPIRATORY_TRACT
  Filled 2014-12-10: qty 3

## 2014-12-10 NOTE — ED Notes (Signed)
Pt states on-going dyspnea x 3 weeks. Pt states he wears a CPAP and woke short of breath despite using the CPAP. Pt states he has seen his PCP and received new meds for diuresis that has not resolved his dyspnea. Pt is dyspneic at rest and having difficulty completing sentences. Pt is tachypneic at rest.

## 2014-12-10 NOTE — ED Provider Notes (Signed)
[facility] Emergency Department Provider Note   ____________________________________________  Time seen: 7:10 AM I have reviewed the triage vital signs and the triage nursing note.  HISTORY  Chief Complaint Shortness of Breath   Historian Patient  HPI Marcus Williams is a 59 y.o. male who is complaining of shortness of breath since 3:30 AM when he woke up with a CPAP on until he cannot catch his breath. He did try his inhaler, and then taking steam shower, but this did not help very much, so he came in by EMS. He did receive a DuoNeb and gain some significant improvement from that. He has a history of bypass, COPD, sleep apnea, morbid obesity, and states he is on a blood thinner but doesn't know what it is.He states his primary care doctor, Dr. Doy Hutching, has increased his Lasix for couple of days and it doesn't seem to have improved his dyspnea. The dyspnea overall started probably about 3 weeks ago. He does have bilateral lower sure he swelling, but no certain risk factors for DVT.    Past Medical History  Diagnosis Date  . MI (myocardial infarction)   . Sepsis   . History of cardioversion   . Diabetes mellitus without complication   . Hypertension     Patient Active Problem List   Diagnosis Date Noted  . Diabetes 10/14/2014    Past Surgical History  Procedure Laterality Date  . Quadruple bypass    . Knee surgery      Current Outpatient Rx  Name  Route  Sig  Dispense  Refill  . amiodarone (PACERONE) 200 MG tablet   Oral   Take 200 mg by mouth daily.         Marland Kitchen aspirin EC 325 MG tablet   Oral   Take 325 mg by mouth daily.         . budesonide-formoterol (SYMBICORT) 160-4.5 MCG/ACT inhaler   Inhalation   Inhale 2 puffs into the lungs 2 (two) times daily.         Marland Kitchen diltiazem (CARDIZEM CD) 300 MG 24 hr capsule   Oral   Take 1 capsule (300 mg total) by mouth daily.   30 capsule   5   . fluticasone (FLONASE) 50 MCG/ACT nasal spray  Each Nare   Place 1 spray into both nostrils daily.         . furosemide (LASIX) 40 MG tablet   Oral   Take 1 tablet (40 mg total) by mouth 2 (two) times daily.   60 tablet   5   . glimepiride (AMARYL) 4 MG tablet   Oral   Take 4 mg by mouth daily.         . insulin aspart (NOVOLOG) 100 UNIT/ML injection   Subcutaneous   Inject 40 Units into the skin daily.          . Ipratropium-Albuterol (ALBUTEROL-IPRATROPIUM IN)   Inhalation   Inhale 2.5 mg into the lungs 4 (four) times daily. 2.5mg -0.5mg /28ml inhalation 4 times a day         . LORazepam (ATIVAN) 1 MG tablet   Oral   Take 1 mg by mouth every 4 (four) hours as needed for anxiety.         . metFORMIN (GLUCOPHAGE) 1000 MG tablet   Oral   Take 1,000 mg by mouth 2 (two) times daily with a meal.         . metoprolol tartrate (LOPRESSOR) 25 MG tablet  Oral   Take 1 tablet (25 mg total) by mouth 2 (two) times daily.   60 tablet   5   . montelukast (SINGULAIR) 10 MG tablet   Oral   Take 10 mg by mouth at bedtime.         . pantoprazole (PROTONIX) 40 MG tablet   Oral   Take 40 mg by mouth daily.         . Rivaroxaban (XARELTO) 15 MG TABS tablet   Oral   Take 15 mg by mouth daily with supper.         . simvastatin (ZOCOR) 40 MG tablet   Oral   Take 40 mg by mouth daily.         . traZODone (DESYREL) 100 MG tablet   Oral   Take 100 mg by mouth at bedtime.         . nitroGLYCERIN (NITRODUR - DOSED IN MG/24 HR) 0.6 mg/hr patch   Transdermal   Place 0.6 mg onto the skin daily.         . nitroGLYCERIN (NITROSTAT) 0.4 MG SL tablet   Sublingual   Place 0.4 mg under the tongue every 5 (five) minutes as needed for chest pain.         . pramipexole (MIRAPEX) 0.5 MG tablet   Oral   Take 0.5 mg by mouth at bedtime.         . pregabalin (LYRICA) 75 MG capsule   Oral   Take 75 mg by mouth 2 (two) times daily.           Allergies Review of patient's allergies indicates no known  allergies.  History reviewed. No pertinent family history.  Social History History  Substance Use Topics  . Smoking status: Never Smoker   . Smokeless tobacco: Not on file  . Alcohol Use: No    Review of Systems  Constitutional: Negative for fever. Eyes: Negative for visual changes. ENT: Negative for sore throat. Cardiovascular: Some chest pressure upon arrival to the ED, this is gone now. Bilateral lower extremity edema Respiratory: Negative for shortness of breath. No cough Gastrointestinal: Negative for abdominal pain, vomiting and diarrhea. Genitourinary: Negative for dysuria. Musculoskeletal: Negative for back pain. Skin: Negative for rash. Neurological: Negative for headaches, focal weakness or numbness. 10 point Review of Systems otherwise negative ____________________________________________   PHYSICAL EXAM:  VITAL SIGNS: ED Triage Vitals  Enc Vitals Group     BP 12/10/14 0549 160/60 mmHg     Pulse Rate 12/10/14 0549 65     Resp 12/10/14 0549 32     Temp 12/10/14 0549 98.2 F (36.8 C)     Temp src --      SpO2 12/10/14 0549 93 %     Weight 12/10/14 0549 280 lb (127.007 kg)     Height 12/10/14 0549 5\' 8"  (1.727 m)     Head Cir --      Peak Flow --      Pain Score 12/10/14 0550 7     Pain Loc --      Pain Edu? --      Excl. in [place]? --      Constitutional: Alert and oriented. Well appearing and in no distress. Eyes: Conjunctivae are normal. PERRL. Normal extraocular movements. ENT   Head: Normocephalic and atraumatic.   Nose: No congestion/rhinnorhea.   Mouth/Throat: Mucous membranes are moist.   Neck: No stridor. Cardiovascular/Chest: Normal rate, regular rhythm.  No murmurs, rubs, or gallops.  Healed midline sternotomy scar. Respiratory: Normal respiratory effort without tachypnea nor retractions. Breath sounds are clear and equal bilaterally. Slightly decreased air movement throughout, with mild and expiratory wheeze Gastrointestinal:  Soft. No distention, no guarding, no rebound. Nontender. Morbidly obese  Genitourinary/rectal: Deferred Musculoskeletal: Nontender with normal range of motion in all extremities. No joint effusions.  No lower extremity tenderness. 2+ lower extremity pitting edema bilaterally. Neurologic:  Normal speech and language. No gross focal neurologic deficits are appreciated. Skin:  Skin is warm, dry and intact. No rash noted. Psychiatric: Mood and affect are normal. Speech and behavior are normal. Patient exhibits appropriate insight and judgment.  ____________________________________________   EKG I, Lisa Roca, MD, the attending physician have personally viewed and interpreted all ECGs.  63 bpm. Normal sinus rhythm. Narrow QRS. Normal axis. Nonspecific T wave. ____________________________________________  LABS (pertinent positives/negatives)  CBC significant for hemoglobin 11.8 and platelets, 008 Metabolic panel significant for BUN 26 and glucose of 217 BNP 169 Troponin less than 0.03  ____________________________________________  RADIOLOGY All Xrays were viewed by me. Image interpreted by Radiologist.  Chest x-ray two-view: Patchy bilateral atelectasis Chest CT:  IMPRESSION: 1. No evidence for acute pulmonary embolus. 2. Aortic atherosclerosis. 3. Bilateral upper lobe subsegmental atelectasis. __________________________________________  PROCEDURES  Procedure(s) performed: None Critical Care performed: None  ____________________________________________   ED COURSE / ASSESSMENT AND PLAN  CONSULTATIONS: None  Pertinent labs & imaging results that were available during my care of the patient were reviewed by me and considered in my medical decision making (see chart for details).  The patient did receive some improvement after bronchodilator therapy, and I do suspect some aspect of bronchospasm. I will treat him with a 5 day burst of prednisone. I do not suspect cardiac  etiology of shortness of breath, however we'll have him follow-up with his cardiologist for further evaluation. Chest CT was done to rule out pulmonary cause him, and this was negative.  Patient / Family / Caregiver informed of clinical course, medical decision-making process, and agree with plan.   I discussed return precautions, follow-up instructions, and discharged instructions with patient and/or family.  ___________________________________________   FINAL CLINICAL IMPRESSION(S) / ED DIAGNOSES   Final diagnoses:  Bronchospasm  Sleep apnea    FOLLOW UP  Referred to: His primary care physician, and cardiologist.   Lisa Roca, MD 12/10/14 [phone]

## 2014-12-10 NOTE — ED Notes (Signed)
Pt placed on O2 for comfort. Pt taken to xray.

## 2014-12-10 NOTE — Discharge Instructions (Signed)
You were evaluated for shortness of breath upon waking up associated with bronchospasm. Your exam and evaluation are reassuring in the emergency department today. Continue to use albuterol inhaler 2 puffs as needed every 4-6 hours for wheezing. Use her CPAP machine while sleeping. Return to the emergency department for any new or worsening condition including fever, cough, chest pain, trouble breathing or shortness of breath.   Bronchospasm A bronchospasm is a spasm or tightening of the airways going into the lungs. During a bronchospasm breathing becomes more difficult because the airways get smaller. When this happens there can be coughing, a whistling sound when breathing (wheezing), and difficulty breathing. Bronchospasm is often associated with asthma, but not all patients who experience a bronchospasm have asthma. CAUSES  A bronchospasm is caused by inflammation or irritation of the airways. The inflammation or irritation may be triggered by:   Allergies (such as to animals, pollen, food, or mold). Allergens that cause bronchospasm may cause wheezing immediately after exposure or many hours later.   Infection. Viral infections are believed to be the most common cause of bronchospasm.   Exercise.   Irritants (such as pollution, cigarette smoke, strong odors, aerosol sprays, and paint fumes).   Weather changes. Winds increase molds and pollens in the air. Rain refreshes the air by washing irritants out. Cold air may cause inflammation.   Stress and emotional upset.  SIGNS AND SYMPTOMS   Wheezing.   Excessive nighttime coughing.   Frequent or severe coughing with a simple cold.   Chest tightness.   Shortness of breath.  DIAGNOSIS  Bronchospasm is usually diagnosed through a history and physical exam. Tests, such as chest X-rays, are sometimes done to look for other conditions. TREATMENT   Inhaled medicines can be given to open up your airways and help you breathe. The  medicines can be given using either an inhaler or a nebulizer machine.  Corticosteroid medicines may be given for severe bronchospasm, usually when it is associated with asthma. HOME CARE INSTRUCTIONS   Always have a plan prepared for seeking medical care. Know when to call your health care provider and local emergency services (911 in the [place] Know where you can access local emergency care.  Only take medicines as directed by your health care provider.  If you were prescribed an inhaler or nebulizer machine, ask your health care provider to explain how to use it correctly. Always use a spacer with your inhaler if you were given one.  It is necessary to remain calm during an attack. Try to relax and breathe more slowly.  Control your home environment in the following ways:   Change your heating and air conditioning filter at least once a month.   Limit your use of fireplaces and wood stoves.  Do not smoke and do not allow smoking in your home.   Avoid exposure to perfumes and fragrances.   Get rid of pests (such as roaches and mice) and their droppings.   Throw away plants if you see mold on them.   Keep your house clean and dust free.   Replace carpet with wood, tile, or vinyl flooring. Carpet can trap dander and dust.   Use allergy-proof pillows, mattress covers, and box spring covers.   Wash bed sheets and blankets every week in hot water and dry them in a dryer.   Use blankets that are made of polyester or cotton.   Wash hands frequently. SEEK MEDICAL CARE IF:   You have muscle aches.  You have chest pain.   The sputum changes from clear or white to yellow, green, gray, or bloody.   The sputum you cough up gets thicker.   There are problems that may be related to the medicine you are given, such as a rash, itching, swelling, or trouble breathing.  SEEK IMMEDIATE MEDICAL CARE IF:   You have worsening wheezing and coughing even after taking  your prescribed medicines.   You have increased difficulty breathing.   You develop severe chest pain. MAKE SURE YOU:   Understand these instructions.  Will watch your condition.  Will get help right away if you are not doing well or get worse. Document Released: 06/05/2003 Document Revised: 06/07/2013 Document Reviewed: 11/22/2012 [employer] [facility] Patient Information 2015 [place], [place]. This information is not intended to replace advice given to you by your health care provider. Make sure you discuss any questions you have with your health care provider.

## 2014-12-13 ENCOUNTER — Other Ambulatory Visit: Payer: Self-pay | Admitting: Internal Medicine

## 2014-12-13 DIAGNOSIS — R51 Headache: Principal | ICD-10-CM

## 2014-12-13 DIAGNOSIS — R519 Headache, unspecified: Secondary | ICD-10-CM

## 2014-12-21 ENCOUNTER — Ambulatory Visit
Admission: RE | Admit: 2014-12-21 | Discharge: 2014-12-21 | Disposition: A | Discharge status: Home / Self Care | Payer: BLUE CROSS/BLUE SHIELD | Source: Ambulatory Visit | Attending: Internal Medicine | Admitting: Internal Medicine

## 2014-12-21 DIAGNOSIS — R51 Headache: Secondary | ICD-10-CM | POA: Insufficient documentation

## 2014-12-21 DIAGNOSIS — G319 Degenerative disease of nervous system, unspecified: Secondary | ICD-10-CM | POA: Diagnosis not present

## 2014-12-21 DIAGNOSIS — R519 Headache, unspecified: Secondary | ICD-10-CM

## 2014-12-22 ENCOUNTER — Encounter: Payer: Self-pay | Admitting: Emergency Medicine

## 2014-12-22 ENCOUNTER — Emergency Department: Payer: BLUE CROSS/BLUE SHIELD

## 2014-12-22 ENCOUNTER — Other Ambulatory Visit: Payer: Self-pay

## 2014-12-22 ENCOUNTER — Observation Stay
Admission: EM | Admit: 2014-12-22 | Discharge: 2014-12-23 | Disposition: A | Discharge status: Home / Self Care | Payer: BLUE CROSS/BLUE SHIELD | Attending: Internal Medicine | Admitting: Internal Medicine

## 2014-12-22 DIAGNOSIS — Z7982 Long term (current) use of aspirin: Secondary | ICD-10-CM | POA: Diagnosis not present

## 2014-12-22 DIAGNOSIS — I481 Persistent atrial fibrillation: Secondary | ICD-10-CM | POA: Diagnosis not present

## 2014-12-22 DIAGNOSIS — R0602 Shortness of breath: Secondary | ICD-10-CM | POA: Diagnosis not present

## 2014-12-22 DIAGNOSIS — R062 Wheezing: Secondary | ICD-10-CM | POA: Insufficient documentation

## 2014-12-22 DIAGNOSIS — Z7952 Long term (current) use of systemic steroids: Secondary | ICD-10-CM | POA: Diagnosis not present

## 2014-12-22 DIAGNOSIS — Z7951 Long term (current) use of inhaled steroids: Secondary | ICD-10-CM | POA: Insufficient documentation

## 2014-12-22 DIAGNOSIS — R05 Cough: Secondary | ICD-10-CM | POA: Diagnosis not present

## 2014-12-22 DIAGNOSIS — J9811 Atelectasis: Secondary | ICD-10-CM | POA: Insufficient documentation

## 2014-12-22 DIAGNOSIS — Z951 Presence of aortocoronary bypass graft: Secondary | ICD-10-CM | POA: Diagnosis not present

## 2014-12-22 DIAGNOSIS — I252 Old myocardial infarction: Secondary | ICD-10-CM | POA: Insufficient documentation

## 2014-12-22 DIAGNOSIS — K760 Fatty (change of) liver, not elsewhere classified: Secondary | ICD-10-CM | POA: Insufficient documentation

## 2014-12-22 DIAGNOSIS — I509 Heart failure, unspecified: Secondary | ICD-10-CM | POA: Insufficient documentation

## 2014-12-22 DIAGNOSIS — I10 Essential (primary) hypertension: Secondary | ICD-10-CM | POA: Insufficient documentation

## 2014-12-22 DIAGNOSIS — G4730 Sleep apnea, unspecified: Secondary | ICD-10-CM | POA: Insufficient documentation

## 2014-12-22 DIAGNOSIS — J441 Chronic obstructive pulmonary disease with (acute) exacerbation: Secondary | ICD-10-CM

## 2014-12-22 DIAGNOSIS — R51 Headache: Secondary | ICD-10-CM | POA: Insufficient documentation

## 2014-12-22 DIAGNOSIS — Z794 Long term (current) use of insulin: Secondary | ICD-10-CM | POA: Diagnosis not present

## 2014-12-22 DIAGNOSIS — Z79899 Other long term (current) drug therapy: Secondary | ICD-10-CM | POA: Diagnosis not present

## 2014-12-22 DIAGNOSIS — R911 Solitary pulmonary nodule: Secondary | ICD-10-CM | POA: Insufficient documentation

## 2014-12-22 DIAGNOSIS — R0600 Dyspnea, unspecified: Secondary | ICD-10-CM | POA: Diagnosis not present

## 2014-12-22 DIAGNOSIS — Z7901 Long term (current) use of anticoagulants: Secondary | ICD-10-CM | POA: Insufficient documentation

## 2014-12-22 DIAGNOSIS — J8410 Pulmonary fibrosis, unspecified: Secondary | ICD-10-CM | POA: Insufficient documentation

## 2014-12-22 DIAGNOSIS — E119 Type 2 diabetes mellitus without complications: Secondary | ICD-10-CM | POA: Diagnosis not present

## 2014-12-22 LAB — GLUCOSE, CAPILLARY: GLUCOSE-CAPILLARY: 303 mg/dL — AB (ref 65–99)

## 2014-12-22 LAB — BASIC METABOLIC PANEL
ANION GAP: 11 (ref 5–15)
BUN: 16 mg/dL (ref 6–20)
CHLORIDE: 103 mmol/L (ref 101–111)
CO2: 24 mmol/L (ref 22–32)
Calcium: 8.9 mg/dL (ref 8.9–10.3)
Creatinine, Ser: 0.96 mg/dL (ref 0.61–1.24)
GFR calc Af Amer: >60 mL/min (ref >60)
Glucose, Bld: 188 mg/dL — ABNORMAL HIGH (ref 65–99)
Potassium: 4.4 mmol/L (ref 3.5–5.1)
SODIUM: 138 mmol/L (ref 135–145)

## 2014-12-22 LAB — CBC
HCT: 35 % — ABNORMAL LOW (ref 40.0–52.0)
HEMOGLOBIN: 11.3 g/dL — AB (ref 13.0–18.0)
MCH: 26.2 pg (ref 26.0–34.0)
MCHC: 32.2 g/dL (ref 32.0–36.0)
MCV: 81.3 fL (ref 80.0–100.0)
Platelets: 167 10*3/uL (ref 150–440)
RBC: 4.31 MIL/uL — AB (ref 4.40–5.90)
RDW: 17.1 % — AB (ref 11.5–14.5)
WBC: 8.4 10*3/uL (ref 3.8–10.6)

## 2014-12-22 LAB — BLOOD GAS, VENOUS
Acid-Base Excess: 1.5 mmol/L (ref 0.0–3.0)
BICARBONATE: 25.9 meq/L (ref 21.0–28.0)
PH VEN: 7.43 (ref 7.320–7.430)
Patient temperature: 37
pCO2, Ven: 39 mmHg — ABNORMAL LOW (ref 44.0–60.0)

## 2014-12-22 LAB — TROPONIN I

## 2014-12-22 LAB — BRAIN NATRIURETIC PEPTIDE: B Natriuretic Peptide: 146 pg/mL — ABNORMAL HIGH (ref 0.0–100.0)

## 2014-12-22 MED ORDER — ONDANSETRON HCL 4 MG PO TABS: 4.0000 mg | ORAL_TABLET | Freq: Four times a day (QID) | ORAL | Status: DC | PRN

## 2014-12-22 MED ORDER — SIMVASTATIN 40 MG PO TABS
40.0000 mg | ORAL_TABLET | Freq: Every day | ORAL | Status: DC
  Administered 2014-12-23: 40 mg via ORAL
  Filled 2014-12-22: qty 1

## 2014-12-22 MED ORDER — ALBUTEROL SULFATE (2.5 MG/3ML) 0.083% IN NEBU
INHALATION_SOLUTION | RESPIRATORY_TRACT | Status: AC
  Administered 2014-12-22: 5 mg via RESPIRATORY_TRACT
  Filled 2014-12-22: qty 6

## 2014-12-22 MED ORDER — ONDANSETRON HCL 4 MG/2ML IJ SOLN: 4.0000 mg | Freq: Four times a day (QID) | INTRAMUSCULAR | Status: DC | PRN

## 2014-12-22 MED ORDER — GLIMEPIRIDE 4 MG PO TABS
4.0000 mg | ORAL_TABLET | Freq: Every day | ORAL | Status: DC
  Administered 2014-12-23: 4 mg via ORAL
  Filled 2014-12-22 (×2): qty 1

## 2014-12-22 MED ORDER — PANTOPRAZOLE SODIUM 40 MG PO TBEC
40.0000 mg | DELAYED_RELEASE_TABLET | Freq: Every day | ORAL | Status: DC
  Administered 2014-12-23: 40 mg via ORAL
  Filled 2014-12-22: qty 1

## 2014-12-22 MED ORDER — RIVAROXABAN 15 MG PO TABS: 15.0000 mg | ORAL_TABLET | Freq: Every day | ORAL | Status: DC

## 2014-12-22 MED ORDER — NITROGLYCERIN 0.6 MG/HR TD PT24
0.6000 mg | MEDICATED_PATCH | Freq: Every day | TRANSDERMAL | Status: DC
  Filled 2014-12-22: qty 1

## 2014-12-22 MED ORDER — FLUTICASONE PROPIONATE 50 MCG/ACT NA SUSP
1.0000 | Freq: Every day | NASAL | Status: DC
  Filled 2014-12-22 (×2): qty 16

## 2014-12-22 MED ORDER — LORAZEPAM 1 MG PO TABS: 1.0000 mg | ORAL_TABLET | ORAL | Status: DC | PRN

## 2014-12-22 MED ORDER — INSULIN ASPART 100 UNIT/ML ~~LOC~~ SOLN
0.0000 [IU] | Freq: Every day | SUBCUTANEOUS | Status: DC
  Administered 2014-12-22: 4 [IU] via SUBCUTANEOUS
  Filled 2014-12-22: qty 4

## 2014-12-22 MED ORDER — IPRATROPIUM-ALBUTEROL 0.5-2.5 (3) MG/3ML IN SOLN
RESPIRATORY_TRACT | Status: AC
  Filled 2014-12-22: qty 9

## 2014-12-22 MED ORDER — DILTIAZEM HCL ER COATED BEADS 300 MG PO CP24
300.0000 mg | ORAL_CAPSULE | Freq: Every day | ORAL | Status: DC
  Administered 2014-12-23: 300 mg via ORAL
  Filled 2014-12-22 (×2): qty 1

## 2014-12-22 MED ORDER — METFORMIN HCL 500 MG PO TABS
1000.0000 mg | ORAL_TABLET | Freq: Two times a day (BID) | ORAL | Status: DC
  Administered 2014-12-23: 1000 mg via ORAL
  Filled 2014-12-22: qty 2

## 2014-12-22 MED ORDER — SENNOSIDES-DOCUSATE SODIUM 8.6-50 MG PO TABS
1.0000 | ORAL_TABLET | Freq: Every evening | ORAL | Status: DC | PRN
Start: 2014-12-22 — End: 2014-12-23

## 2014-12-22 MED ORDER — METHYLPREDNISOLONE SODIUM SUCC 125 MG IJ SOLR
INTRAMUSCULAR | Status: AC
  Administered 2014-12-22: 125 mg via INTRAVENOUS
  Filled 2014-12-22: qty 2

## 2014-12-22 MED ORDER — TRAZODONE HCL 100 MG PO TABS
100.0000 mg | ORAL_TABLET | Freq: Every day | ORAL | Status: DC
  Administered 2014-12-22: 100 mg via ORAL
  Filled 2014-12-22: qty 1

## 2014-12-22 MED ORDER — MONTELUKAST SODIUM 10 MG PO TABS
10.0000 mg | ORAL_TABLET | Freq: Every day | ORAL | Status: DC
  Administered 2014-12-22: 10 mg via ORAL
  Filled 2014-12-22: qty 1

## 2014-12-22 MED ORDER — AMIODARONE HCL 200 MG PO TABS
200.0000 mg | ORAL_TABLET | Freq: Every day | ORAL | Status: DC
  Administered 2014-12-23: 200 mg via ORAL
  Filled 2014-12-22: qty 1

## 2014-12-22 MED ORDER — ALBUTEROL SULFATE (2.5 MG/3ML) 0.083% IN NEBU
5.0000 mg | INHALATION_SOLUTION | RESPIRATORY_TRACT | Status: AC
  Administered 2014-12-22: 5 mg via RESPIRATORY_TRACT

## 2014-12-22 MED ORDER — METHYLPREDNISOLONE SODIUM SUCC 125 MG IJ SOLR
125.0000 mg | INTRAMUSCULAR | Status: AC
  Administered 2014-12-22: 125 mg via INTRAVENOUS

## 2014-12-22 MED ORDER — IPRATROPIUM-ALBUTEROL 0.5-2.5 (3) MG/3ML IN SOLN
3.0000 mL | Freq: Once | RESPIRATORY_TRACT | Status: AC
  Administered 2014-12-22: 3 mL via RESPIRATORY_TRACT

## 2014-12-22 MED ORDER — METOPROLOL TARTRATE 25 MG PO TABS
25.0000 mg | ORAL_TABLET | Freq: Two times a day (BID) | ORAL | Status: DC
  Administered 2014-12-22 – 2014-12-23 (×2): 25 mg via ORAL
  Filled 2014-12-22 (×2): qty 1

## 2014-12-22 MED ORDER — ACETAMINOPHEN 325 MG PO TABS
650.0000 mg | ORAL_TABLET | Freq: Four times a day (QID) | ORAL | Status: DC | PRN
  Administered 2014-12-22: 650 mg via ORAL
  Filled 2014-12-22: qty 2

## 2014-12-22 MED ORDER — ALUM & MAG HYDROXIDE-SIMETH 200-200-20 MG/5ML PO SUSP: 30.0000 mL | Freq: Four times a day (QID) | ORAL | Status: DC | PRN

## 2014-12-22 MED ORDER — PRAMIPEXOLE DIHYDROCHLORIDE 0.25 MG PO TABS
0.5000 mg | ORAL_TABLET | Freq: Every day | ORAL | Status: DC
  Administered 2014-12-22: 0.5 mg via ORAL
  Filled 2014-12-22: qty 2

## 2014-12-22 MED ORDER — PREGABALIN 75 MG PO CAPS
75.0000 mg | ORAL_CAPSULE | Freq: Two times a day (BID) | ORAL | Status: DC
  Administered 2014-12-22 – 2014-12-23 (×2): 75 mg via ORAL
  Filled 2014-12-22 (×2): qty 1

## 2014-12-22 MED ORDER — LEVALBUTEROL HCL 0.63 MG/3ML IN NEBU
0.6300 mg | INHALATION_SOLUTION | Freq: Four times a day (QID) | RESPIRATORY_TRACT | Status: DC
  Administered 2014-12-22 – 2014-12-23 (×2): 0.63 mg via RESPIRATORY_TRACT
  Filled 2014-12-22 (×2): qty 3

## 2014-12-22 MED ORDER — FUROSEMIDE 40 MG PO TABS
40.0000 mg | ORAL_TABLET | Freq: Two times a day (BID) | ORAL | Status: DC
  Administered 2014-12-23: 40 mg via ORAL
  Filled 2014-12-22: qty 1

## 2014-12-22 MED ORDER — ACETAMINOPHEN 650 MG RE SUPP: 650.0000 mg | Freq: Four times a day (QID) | RECTAL | Status: DC | PRN

## 2014-12-22 MED ORDER — BUDESONIDE-FORMOTEROL FUMARATE 160-4.5 MCG/ACT IN AERO
2.0000 | INHALATION_SPRAY | Freq: Two times a day (BID) | RESPIRATORY_TRACT | Status: DC
  Administered 2014-12-22 – 2014-12-23 (×2): 2 via RESPIRATORY_TRACT
  Filled 2014-12-22: qty 6

## 2014-12-22 MED ORDER — ASPIRIN EC 325 MG PO TBEC
325.0000 mg | DELAYED_RELEASE_TABLET | Freq: Every day | ORAL | Status: DC
  Administered 2014-12-23: 325 mg via ORAL
  Filled 2014-12-22: qty 1

## 2014-12-22 MED ORDER — METHYLPREDNISOLONE SODIUM SUCC 40 MG IJ SOLR
40.0000 mg | Freq: Every day | INTRAMUSCULAR | Status: DC
  Administered 2014-12-23: 40 mg via INTRAVENOUS
  Filled 2014-12-22: qty 1

## 2014-12-22 MED ORDER — INSULIN ASPART 100 UNIT/ML ~~LOC~~ SOLN
0.0000 [IU] | Freq: Three times a day (TID) | SUBCUTANEOUS | Status: DC
  Administered 2014-12-23: 20 [IU] via SUBCUTANEOUS
  Filled 2014-12-22: qty 20

## 2014-12-22 NOTE — ED Notes (Addendum)
MD at bedside.  MD sts pts SP02 drop to 88%.

## 2014-12-22 NOTE — ED Notes (Signed)
Pt here with c/o shortness of breath, denies copd, asthma, appears in no distress.

## 2014-12-22 NOTE — H&P (Signed)
[facility] [place] NAME: Marcus Williams    MR#:  [id number]  DATE OF BIRTH:  September 08, 1955  DATE OF ADMISSION:  12/22/2014  PRIMARY CARE PHYSICIAN: SPARKS,JEFFREY D, MD   REQUESTING/REFERRING PHYSICIAN: DR.Quale  CHIEF COMPLAINT:   Chief Complaint  Patient presents with  . Shortness of Breath    HISTORY OF PRESENT ILLNESS:  Marcus Williams  is a 59 y.o. male with a known history of present recently diagnosed COPD, comes in with the shortness of breath. Seen in the emergency room on June 26 and was discharged with tapering course of prednisone. However symptoms continued to get worse with shortness of breath wheezing and cough. Received series of nebulizers with the improvement in symptoms. Desatted to 88% on room air in the emergency room. And the sats improved after nebulizer treatment. We are going to keep in observation status for monitoring his respiration and continue nebulizers, steroids.  PAST MEDICAL HISTORY:   Past Medical History  Diagnosis Date  . MI (myocardial infarction)   . Sepsis   . History of cardioversion   . Diabetes mellitus without complication   . Hypertension     PAST SURGICAL HISTOIRY:   Past Surgical History  Procedure Laterality Date  . Quadruple bypass    . Knee surgery      SOCIAL HISTORY:   History  Substance Use Topics  . Smoking status: Never Smoker   . Smokeless tobacco: Not on file  . Alcohol Use: Yes     Comment: occasionally    FAMILY HISTORY:  No family history on file.  DRUG ALLERGIES:  No Known Allergies  REVIEW OF SYSTEMS:  CONSTITUTIONAL: No fever, fatigue or weakness.  EYES: No blurred or double vision.  EARS, NOSE, AND THROAT: No tinnitus or ear pain.  RESPIRATORY: Cough, shortness of breath, wheezing. CARDIOVASCULAR: No chest pain, orthopnea does have pedal edema, Lasix dose has been increased to 80 mg for the past 3 weeks.  GASTROINTESTINAL: No nausea, vomiting, diarrhea  or abdominal pain.  GENITOURINARY: No dysuria, hematuria.  ENDOCRINE: No polyuria, nocturia,  HEMATOLOGY: No anemia, easy bruising or bleeding SKIN: No rash or lesion. MUSCULOSKELETAL: No joint pain or arthritis.   NEUROLOGIC: No tingling, numbness, weakness.  PSYCHIATRY: No anxiety or depression.   MEDICATIONS AT HOME:   Prior to Admission medications   Medication Sig Start Date End Date Taking? Authorizing Provider  albuterol (PROVENTIL HFA;VENTOLIN HFA) 108 (90 BASE) MCG/ACT inhaler Inhale 2 puffs into the lungs every 6 (six) hours as needed for wheezing or shortness of breath. 12/10/14  Yes Lisa Roca, MD  amiodarone (PACERONE) 200 MG tablet Take 200 mg by mouth daily.   Yes Historical Provider, MD  aspirin EC 325 MG tablet Take 325 mg by mouth daily.   Yes Historical Provider, MD  budesonide-formoterol (SYMBICORT) 160-4.5 MCG/ACT inhaler Inhale 2 puffs into the lungs 2 (two) times daily.   Yes Historical Provider, MD  diltiazem (CARDIZEM CD) 300 MG 24 hr capsule Take 1 capsule (300 mg total) by mouth daily. 10/15/14  Yes Idelle Crouch, MD  fluticasone (FLONASE) 50 MCG/ACT nasal spray Place 1 spray into both nostrils daily.   Yes Historical Provider, MD  furosemide (LASIX) 40 MG tablet Take 1 tablet (40 mg total) by mouth 2 (two) times daily. 10/15/14  Yes Idelle Crouch, MD  glimepiride (AMARYL) 4 MG tablet Take 4 mg by mouth daily.   Yes Historical Provider, MD  insulin aspart (NOVOLOG)  100 UNIT/ML injection Inject 40 Units into the skin daily.    Yes Historical Provider, MD  Ipratropium-Albuterol (ALBUTEROL-IPRATROPIUM IN) Inhale 2.5 mg into the lungs 4 (four) times daily. 2.5mg -0.5mg /35ml inhalation 4 times a day   Yes Historical Provider, MD  metFORMIN (GLUCOPHAGE) 1000 MG tablet Take 1,000 mg by mouth 2 (two) times daily with a meal.   Yes Historical Provider, MD  metoprolol tartrate (LOPRESSOR) 25 MG tablet Take 1 tablet (25 mg total) by mouth 2 (two) times daily. 10/15/14  Yes  Idelle Crouch, MD  montelukast (SINGULAIR) 10 MG tablet Take 10 mg by mouth at bedtime.   Yes Historical Provider, MD  nitroGLYCERIN (NITRODUR - DOSED IN MG/24 HR) 0.6 mg/hr patch Place 0.6 mg onto the skin daily.   Yes Historical Provider, MD  pantoprazole (PROTONIX) 40 MG tablet Take 40 mg by mouth daily.   Yes Historical Provider, MD  pramipexole (MIRAPEX) 0.5 MG tablet Take 0.5 mg by mouth at bedtime.   Yes Historical Provider, MD  pregabalin (LYRICA) 75 MG capsule Take 75 mg by mouth 2 (two) times daily.   Yes Historical Provider, MD  Rivaroxaban (XARELTO) 15 MG TABS tablet Take 15 mg by mouth daily with supper.   Yes Historical Provider, MD  simvastatin (ZOCOR) 40 MG tablet Take 40 mg by mouth daily.   Yes Historical Provider, MD  traZODone (DESYREL) 100 MG tablet Take 100 mg by mouth at bedtime.   Yes Historical Provider, MD  LORazepam (ATIVAN) 1 MG tablet Take 1 mg by mouth every 4 (four) hours as needed for anxiety.    Historical Provider, MD  nitroGLYCERIN (NITROSTAT) 0.4 MG SL tablet Place 0.4 mg under the tongue every 5 (five) minutes as needed for chest pain.    Historical Provider, MD  predniSONE (DELTASONE) 20 MG tablet Take 3 tablets (60 mg total) by mouth once. Patient not taking: Reported on 12/22/2014 12/10/14   Lisa Roca, MD      VITAL SIGNS:  Blood pressure 160/65, pulse 81, temperature 98.5 F (36.9 C), temperature source Oral, resp. rate 26, height 5\' 7"  (1.702 m), weight 131.543 kg (290 lb), SpO2 93 %.  PHYSICAL EXAMINATION:  GENERAL:  59 y.o.-year-old patient lying in the bed with no acute distress.  EYES: Pupils equal, round, reactive to light and accommodation. No scleral icterus. Extraocular muscles intact.  HEENT: Head atraumatic, normocephalic. Oropharynx and nasopharynx clear.  NECK:  Supple, no jugular venous distention. No thyroid enlargement, no tenderness.  LUNGS: Normal breath sounds bilaterally, faint bilateral expiratory wheeze in all lung fields.no  crepitation. No use of accessory muscles of respiration.  CARDIOVASCULAR: S1, S2 normal. No murmurs, rubs, or gallops.  ABDOMEN: Soft, nontender, nondistended. Bowel sounds present. No organomegaly or mass.  EXTREMITIE pedal edema  Bilaterally  pitting type up to the knees 2+.  NEUROLOGIC: Cranial nerves II through XII are intact. Muscle strength 5/5 in all extremities. Sensation intact. Gait not checked.  PSYCHIATRIC: The patient is alert and oriented x 3.  SKIN: No obvious rash, lesion, or ulcer.   LABORATORY PANEL:   CBC  Recent Labs Lab 12/22/14 1427  WBC 8.4  HGB 11.3*  HCT 35.0*  PLT 167   ------------------------------------------------------------------------------------------------------------------  Chemistries   Recent Labs Lab 12/22/14 1427  NA 138  K 4.4  CL 103  CO2 24  GLUCOSE 188*  BUN 16  CREATININE 0.96  CALCIUM 8.9   ------------------------------------------------------------------------------------------------------------------  Cardiac Enzymes  Recent Labs Lab 12/22/14 1427  TROPONINI <0.03   ------------------------------------------------------------------------------------------------------------------  RADIOLOGY:  Ct Head Wo Contrast  12/21/2014   CLINICAL DATA:  Shortness of breath for 1 month getting worse, severe intermittent headaches lasting for 3 hours or more  EXAM: CT HEAD WITHOUT CONTRAST  TECHNIQUE: Contiguous axial images were obtained from the base of the skull through the vertex without intravenous contrast.  COMPARISON:  08/14/2014  FINDINGS: Bilateral age-related calcification of the basal ganglia. Mild diffuse age-related cortical atrophy. No evidence of mass or infarct. No hydrocephalus hemorrhage or extra-axial fluid. Calvarium intact. No significant inflammatory change in the visualized portions of the paranasal sinuses.  IMPRESSION: No acute intracranial abnormalities.   Electronically Signed   By: Skipper Cliche M.D.    On: 12/21/2014 13:05   Dg Chest Port 1 View  12/22/2014   CLINICAL DATA:  Shortness of breath for 1 day  EXAM: PORTABLE CHEST - 1 VIEW  COMPARISON:  Chest radiograph and chest CT December 10, 2014  FINDINGS: Slight scarring in each mid lung is stable. There is no edema or consolidation. Heart size and pulmonary vascularity are normal. No adenopathy. Patient is status post coronary artery bypass grafting.  IMPRESSION: Scattered areas of mild scarring.  No edema or consolidation.   Electronically Signed   By: Lowella Grip III M.D.   On: 12/22/2014 15:15    EKG:   Orders placed or performed during the hospital encounter of 12/22/14  . ED EKG (<49mins upon arrival to the ED)  . ED EKG (<5mins upon arrival to the ED)    IMPRESSION AND PLAN:   1. COPD exacerbation with persistent wheezing and shortness of breath despite outpatient steroids use:: Patient needs to be monitored overnight, continue IV steroids, nebulizers around-the-clock. And the see how he responds. No need for IV antipyrotic's at this time. Monitor clinical course. #2 congestive heart failure ;continue Lasix 3. Persistent atrial fibrillation Atrial fibrillation: Rate controlled, continue beta blockers, Cardizem, amiodarone, Xarelto. Cardioversion before. Hypertension continue home medications. History of sleep apnea continue CPAP. Excellent apical diabetes mellitus patient is on metformin, Amaryl, insulin sliding scale. Monitor fingerstick glucose closely secondary to steroid use. All the records are reviewed and case discussed with ED provider. Management plans discussed with the patient, family and they are in agreement.  CODE STATUS: Full  TOTAL TIME TAKING CARE OF THIS PATIENT: 60 minutes.    Epifanio Lesches M.D on 12/22/2014 at 6:30 PM  Between 7am to 6pm - Pager - [phone]  After 6pm go to [email or web address] - password [email or web address] [facility]  Office  [phone]  CC: Primary care physician;  Idelle Crouch, MD

## 2014-12-22 NOTE — ED Provider Notes (Signed)
[facility] Emergency Department Provider Note  ____________________________________________  Time seen: Approximately 5:44 PM  I have reviewed the triage vital signs and the nursing notes.   HISTORY  Chief Complaint Shortness of Breath    HPI Marcus Williams is a 59 y.o. male comes noting shortness of breath. Patient states that he has been having increasing shortness of breath for approximately 2 weeks. He reports wheezing, he was seen in the ER and placed on prednisone and states his symptoms have not improved and he continues to wheeze. Feels very short of breath when he is trying to sleep at night and also while up and walking. He denies having any chest pain. He is not in pain currently. Does report recently seen Dr. Doy Hutching for similar symptoms, but states he just continues to seem to worsen.  Patient reports that he has been told he may have COPD by Dr. Doy Hutching. He also reports that his diabetes went out of control while on prednisone last time and had to have this stopped.  Reports similar symptoms previously worsening over the last 2 weeks.   Past Medical History  Diagnosis Date  . MI (myocardial infarction)   . Sepsis   . History of cardioversion   . Diabetes mellitus without complication   . Hypertension     Patient Active Problem List   Diagnosis Date Noted  . Diabetes 10/14/2014    Past Surgical History  Procedure Laterality Date  . Quadruple bypass    . Knee surgery      Current Outpatient Rx  Name  Route  Sig  Dispense  Refill  . albuterol (PROVENTIL HFA;VENTOLIN HFA) 108 (90 BASE) MCG/ACT inhaler   Inhalation   Inhale 2 puffs into the lungs every 6 (six) hours as needed for wheezing or shortness of breath.   1 Inhaler   0   . amiodarone (PACERONE) 200 MG tablet   Oral   Take 200 mg by mouth daily.         Marland Kitchen aspirin EC 325 MG tablet   Oral   Take 325 mg by mouth daily.         . budesonide-formoterol (SYMBICORT) 160-4.5  MCG/ACT inhaler   Inhalation   Inhale 2 puffs into the lungs 2 (two) times daily.         Marland Kitchen diltiazem (CARDIZEM CD) 300 MG 24 hr capsule   Oral   Take 1 capsule (300 mg total) by mouth daily.   30 capsule   5   . fluticasone (FLONASE) 50 MCG/ACT nasal spray   Each Nare   Place 1 spray into both nostrils daily.         . furosemide (LASIX) 40 MG tablet   Oral   Take 1 tablet (40 mg total) by mouth 2 (two) times daily.   60 tablet   5   . glimepiride (AMARYL) 4 MG tablet   Oral   Take 4 mg by mouth daily.         . insulin aspart (NOVOLOG) 100 UNIT/ML injection   Subcutaneous   Inject 40 Units into the skin daily.          . Ipratropium-Albuterol (ALBUTEROL-IPRATROPIUM IN)   Inhalation   Inhale 2.5 mg into the lungs 4 (four) times daily. 2.5mg -0.5mg /72ml inhalation 4 times a day         . metFORMIN (GLUCOPHAGE) 1000 MG tablet   Oral   Take 1,000 mg by mouth 2 (two) times daily  with a meal.         . metoprolol tartrate (LOPRESSOR) 25 MG tablet   Oral   Take 1 tablet (25 mg total) by mouth 2 (two) times daily.   60 tablet   5   . montelukast (SINGULAIR) 10 MG tablet   Oral   Take 10 mg by mouth at bedtime.         . nitroGLYCERIN (NITRODUR - DOSED IN MG/24 HR) 0.6 mg/hr patch   Transdermal   Place 0.6 mg onto the skin daily.         . pantoprazole (PROTONIX) 40 MG tablet   Oral   Take 40 mg by mouth daily.         . pramipexole (MIRAPEX) 0.5 MG tablet   Oral   Take 0.5 mg by mouth at bedtime.         . pregabalin (LYRICA) 75 MG capsule   Oral   Take 75 mg by mouth 2 (two) times daily.         . Rivaroxaban (XARELTO) 15 MG TABS tablet   Oral   Take 15 mg by mouth daily with supper.         . simvastatin (ZOCOR) 40 MG tablet   Oral   Take 40 mg by mouth daily.         . traZODone (DESYREL) 100 MG tablet   Oral   Take 100 mg by mouth at bedtime.         Marland Kitchen LORazepam (ATIVAN) 1 MG tablet   Oral   Take 1 mg by mouth every 4  (four) hours as needed for anxiety.         . nitroGLYCERIN (NITROSTAT) 0.4 MG SL tablet   Sublingual   Place 0.4 mg under the tongue every 5 (five) minutes as needed for chest pain.         . predniSONE (DELTASONE) 20 MG tablet   Oral   Take 3 tablets (60 mg total) by mouth once. Patient not taking: Reported on 12/22/2014   12 tablet   0     Allergies Review of patient's allergies indicates no known allergies.  No family history on file.  Social History History  Substance Use Topics  . Smoking status: Never Smoker   . Smokeless tobacco: Not on file  . Alcohol Use: Yes     Comment: occasionally    Review of Systems Constitutional: No fever/chills Eyes: No visual changes. ENT: No sore throat. Cardiovascular: Denies chest pain. Respiratory: See history of present illness Gastrointestinal: No abdominal pain.  No nausea, no vomiting.  No diarrhea.  No constipation. Genitourinary: Negative for dysuria. Musculoskeletal: Negative for back pain. Skin: Negative for rash. Notes some slight swelling in his lower legs increasing over the last week. Neurological: Negative for headaches, focal weakness or numbness.  10-point ROS otherwise negative.  ____________________________________________   PHYSICAL EXAM:  VITAL SIGNS: ED Triage Vitals  Enc Vitals Group     BP 12/22/14 1414 160/68 mmHg     Pulse Rate 12/22/14 1414 73     Resp 12/22/14 1414 20     Temp 12/22/14 1414 98.5 F (36.9 C)     Temp Source 12/22/14 1414 Oral     SpO2 12/22/14 1414 96 %     Weight 12/22/14 1414 290 lb (131.543 kg)     Height 12/22/14 1414 5\' 7"  (1.702 m)     Head Cir --      Peak Flow --  Pain Score 12/22/14 1415 9     Pain Loc --      Pain Edu? --      Excl. in [place]? --     Constitutional: Alert and oriented. Patient appears in mild to moderate increased work of breathing with audible wheezing on entry. Eyes: Conjunctivae are normal. PERRL. EOMI. Head: Atraumatic. Nose: No  congestion/rhinnorhea. Mouth/Throat: Mucous membranes are moist.  Oropharynx non-erythematous. Neck: No stridor.   Cardiovascular: Normal rate, regular rhythm. Grossly normal heart sounds.  Good peripheral circulation. Respiratory: Patient has diffuse tender expiratory wheezing, mild use of accessory muscles, he does speak in short sentences and does appear to be mild to moderately dyspneic.  Gastrointestinal: Soft and nontender. No distention. No abdominal bruits. No CVA tenderness. Musculoskeletal: No lower extremity tenderness No joint effusions. Neurologic:  Normal speech and language. No gross focal neurologic deficits are appreciated. Speech is normal. Plus lower extremity edema bilaterally. Skin:  Skin is warm, dry and intact. No rash noted. Psychiatric: Mood and affect are normal. Speech and behavior are normal.  ____________________________________________   LABS (all labs ordered are listed, but only abnormal results are displayed)  Labs Reviewed  BASIC METABOLIC PANEL - Abnormal; Notable for the following:    Glucose, Bld 188 (*)    All other components within normal limits  BRAIN NATRIURETIC PEPTIDE - Abnormal; Notable for the following:    B Natriuretic Peptide 146.0 (*)    All other components within normal limits  CBC - Abnormal; Notable for the following:    RBC 4.31 (*)    Hemoglobin 11.3 (*)    HCT 35.0 (*)    RDW 17.1 (*)    All other components within normal limits  BLOOD GAS, VENOUS - Abnormal; Notable for the following:    pCO2, Ven 39 (*)    All other components within normal limits  TROPONIN I   ____________________________________________  EKG  Reviewed and interpreted by me EKG time 1420 Ventricular rate 75 There is evidence of incomplete right bundle-branch block Intervals normal with exception of QTc 465.  Impression Normal sinus rhythm incomplete right bundle-branch block, no evidence of acute T wave abnormalities suggestive  ischemia. ____________________________________________  RADIOLOGY  CLINICAL DATA: Shortness of breath for 1 day  EXAM: PORTABLE CHEST - 1 VIEW  COMPARISON: Chest radiograph and chest CT December 10, 2014  FINDINGS: Slight scarring in each mid lung is stable. There is no edema or consolidation. Heart size and pulmonary vascularity are normal. No adenopathy. Patient is status post coronary artery bypass grafting.  IMPRESSION: Scattered areas of mild scarring. No edema or consolidation. ____________________________________________   PROCEDURES  Procedure(s) performed: None  Critical Care performed: No  ____________________________________________   INITIAL IMPRESSION / ASSESSMENT AND PLAN / ED COURSE  Pertinent labs & imaging results that were available during my care of the patient were reviewed by me and considered in my medical decision making (see chart for details).  Patient presents with dyspnea and wheezing. Of note is recently evaluated in the emergency room for similar symptoms had a CT scan did not demonstrate a pulmonary embolism at that time. He denies any chest pain or acute coronary syndrome symptoms. Reports his shortness of breath has slowly been worsening over time and he now presents with mild to moderate increased work of breathing. He denies any infectious symptoms.  Chest x-ray does not demonstrate acute consolidation or edema. His labs show slightly elevated BNP, however in the setting of his current symptomatology and review of  his labs and x-ray suspect the patient is suffering from mild bronchospasm and likely COPD exacerbation.  ----------------------------------------- 5:48 PM on 12/22/2014 -----------------------------------------  After receiving a total of 3 DuoNeb's and Solu-Medrol the patient still reports ongoing wheezing and dyspnea. He was noted to desaturate to approximately 88% on room air with good waveform after treatments. At this point,  a stone his poor response to treatment in the ER and ongoing mild to moderate dyspnea with accessory muscle use we'll admit him for further workup and pulmonary treatments.   ____________________________________________   FINAL CLINICAL IMPRESSION(S) / ED DIAGNOSES  Final diagnoses:  COPD with exacerbation      Delman Kitten, MD 12/22/14 1750

## 2014-12-22 NOTE — ED Notes (Signed)
Floor called pt OTW

## 2014-12-22 NOTE — ED Notes (Signed)
MD at bedside. 

## 2014-12-23 LAB — BASIC METABOLIC PANEL
Anion gap: 12 (ref 5–15)
BUN: 26 mg/dL — ABNORMAL HIGH (ref 6–20)
CHLORIDE: 101 mmol/L (ref 101–111)
CO2: 22 mmol/L (ref 22–32)
CREATININE: 1.21 mg/dL (ref 0.61–1.24)
Calcium: 8.7 mg/dL — ABNORMAL LOW (ref 8.9–10.3)
GFR calc non Af Amer: >60 mL/min (ref >60)
Glucose, Bld: 463 mg/dL — ABNORMAL HIGH (ref 65–99)
POTASSIUM: 5.3 mmol/L — AB (ref 3.5–5.1)
Sodium: 135 mmol/L (ref 135–145)

## 2014-12-23 LAB — GLUCOSE, CAPILLARY
Glucose-Capillary: 407 mg/dL — ABNORMAL HIGH (ref 65–99)
Glucose-Capillary: 428 mg/dL — ABNORMAL HIGH (ref 65–99)

## 2014-12-23 MED ORDER — INSULIN GLARGINE 100 UNIT/ML ~~LOC~~ SOLN
15.0000 [IU] | Freq: Every day | SUBCUTANEOUS | Status: DC
  Filled 2014-12-23 (×2): qty 0.15

## 2014-12-23 MED ORDER — INSULIN ASPART 100 UNIT/ML ~~LOC~~ SOLN
10.0000 [IU] | Freq: Once | SUBCUTANEOUS | Status: AC
  Administered 2014-12-23: 10 [IU] via SUBCUTANEOUS
  Filled 2014-12-23: qty 10

## 2014-12-23 NOTE — Progress Notes (Deleted)
Pt encouraged to sit up in the chair and she refused.  Pt states she wants to eat real food.  Dr in to see the pt and will proceed with orders if written.

## 2014-12-23 NOTE — Progress Notes (Signed)
Pt has scattered scabs and scratches. Pt claims "they come from a rash". There is a right toe laceration to the right side of the toe at a healing stage, and some dry blood around right toe nail. Pt has edema BLE and redness to bilateral legs. Witnessed by [employer], RN.

## 2014-12-23 NOTE — Progress Notes (Signed)
Encourage pt to get up to the chair and he did.  He likes sitting up,  Wanting to go home today, encouraged pt to talk with the dr regarding this matter.  He states he feels so much better.

## 2014-12-23 NOTE — Discharge Summary (Signed)
Physician Discharge Summary  Marcus Williams INO:[id number] DOB: 01/30/1956 DOA: 12/22/2014  PCP: Idelle Crouch, MD  Admit date: 12/22/2014 Discharge date: 12/23/2014    Recommendations for Outpatient Follow-up:  Please see SPARKS,JEFFREY D, MD within 7 days or doctor covering for SPARKS,JEFFREY D, MD  Discharge Diagnoses:  Active Problems:   Diabetes   COPD exacerbation   Discharge Condition: good  Diet recommendation: diabetic  Filed Weights   12/22/14 1414 12/22/14 2004  Weight: 131.543 kg (290 lb) 113.762 kg (250 lb 12.8 oz)    History of present illness:  See History and Physical done on [facility] Course:  Much improved with iv solumedrol. Glucose up, get that way with steriods. Ready and want do dc today with prednisone paper, given written rx 60 by 20 q 3 days.   Procedures:  none  Consultations:  none  Discharge Exam: Filed Vitals:   12/23/14 1009  BP:   Pulse: 80  Temp:   Resp:      Discharge Instructions   Discharge Instructions    Discharge patient    Complete by:  As directed           Current Discharge Medication List    CONTINUE these medications which have NOT CHANGED   Details  albuterol (PROVENTIL HFA;VENTOLIN HFA) 108 (90 BASE) MCG/ACT inhaler Inhale 2 puffs into the lungs every 6 (six) hours as needed for wheezing or shortness of breath. Qty: 1 Inhaler, Refills: 0    amiodarone (PACERONE) 200 MG tablet Take 200 mg by mouth daily.    aspirin EC 325 MG tablet Take 325 mg by mouth daily.    budesonide-formoterol (SYMBICORT) 160-4.5 MCG/ACT inhaler Inhale 2 puffs into the lungs 2 (two) times daily.    diltiazem (CARDIZEM CD) 300 MG 24 hr capsule Take 1 capsule (300 mg total) by mouth daily. Qty: 30 capsule, Refills: 5    fluticasone (FLONASE) 50 MCG/ACT nasal spray Place 1 spray into both nostrils daily.    furosemide (LASIX) 40 MG tablet Take 1 tablet (40 mg total) by mouth 2 (two) times daily. Qty: 60 tablet, Refills: 5     glimepiride (AMARYL) 4 MG tablet Take 4 mg by mouth daily.    insulin aspart (NOVOLOG) 100 UNIT/ML injection Inject 40 Units into the skin daily.     Ipratropium-Albuterol (ALBUTEROL-IPRATROPIUM IN) Inhale 2.5 mg into the lungs 4 (four) times daily. 2.5mg -0.5mg /90ml inhalation 4 times a day    metFORMIN (GLUCOPHAGE) 1000 MG tablet Take 1,000 mg by mouth 2 (two) times daily with a meal.    metoprolol tartrate (LOPRESSOR) 25 MG tablet Take 1 tablet (25 mg total) by mouth 2 (two) times daily. Qty: 60 tablet, Refills: 5    montelukast (SINGULAIR) 10 MG tablet Take 10 mg by mouth at bedtime.    nitroGLYCERIN (NITRODUR - DOSED IN MG/24 HR) 0.6 mg/hr patch Place 0.6 mg onto the skin daily.    pantoprazole (PROTONIX) 40 MG tablet Take 40 mg by mouth daily.    pramipexole (MIRAPEX) 0.5 MG tablet Take 0.5 mg by mouth at bedtime.    pregabalin (LYRICA) 75 MG capsule Take 75 mg by mouth 2 (two) times daily.    Rivaroxaban (XARELTO) 15 MG TABS tablet Take 15 mg by mouth daily with supper.    simvastatin (ZOCOR) 40 MG tablet Take 40 mg by mouth daily.    traZODone (DESYREL) 100 MG tablet Take 100 mg by mouth at bedtime.    LORazepam (ATIVAN) 1 MG tablet Take  1 mg by mouth every 4 (four) hours as needed for anxiety.    nitroGLYCERIN (NITROSTAT) 0.4 MG SL tablet Place 0.4 mg under the tongue every 5 (five) minutes as needed for chest pain.    predniSONE (DELTASONE) 20 MG tablet Take 3 tablets (60 mg total) by mouth once. Qty: 12 tablet, Refills: 0       No Known Allergies    The results of significant diagnostics from this hospitalization (including imaging, microbiology, ancillary and laboratory) are listed below for reference.    Significant Diagnostic Studies: Dg Chest 2 View (if Patient Has Fever And/or Copd)  12/10/2014   CLINICAL DATA:  Subacute onset of difficulty breathing for 2 weeks. Shortness of breath. Initial encounter.  EXAM: CHEST  2 VIEW  COMPARISON:  Chest radiograph  performed 10/14/2014  FINDINGS: The lungs are well-aerated. Patchy bilateral atelectasis is noted. Pulmonary vascularity is at the upper limits of normal. There is no evidence of pleural effusion or pneumothorax.  The heart is borderline normal in size. The patient is status post median sternotomy, with evidence of prior CABG. Several fractured sternal wires are seen. No acute osseous abnormalities are seen.  IMPRESSION: Patchy bilateral atelectasis noted.  Lungs otherwise clear.   Electronically Signed   By: Garald Balding M.D.   On: 12/10/2014 06:54   Ct Head Wo Contrast  12/21/2014   CLINICAL DATA:  Shortness of breath for 1 month getting worse, severe intermittent headaches lasting for 3 hours or more  EXAM: CT HEAD WITHOUT CONTRAST  TECHNIQUE: Contiguous axial images were obtained from the base of the skull through the vertex without intravenous contrast.  COMPARISON:  08/14/2014  FINDINGS: Bilateral age-related calcification of the basal ganglia. Mild diffuse age-related cortical atrophy. No evidence of mass or infarct. No hydrocephalus hemorrhage or extra-axial fluid. Calvarium intact. No significant inflammatory change in the visualized portions of the paranasal sinuses.  IMPRESSION: No acute intracranial abnormalities.   Electronically Signed   By: Skipper Cliche M.D.   On: 12/21/2014 13:05   Ct Angio Chest Pe W/cm &/or Wo Cm  12/10/2014   CLINICAL DATA:  Dyspnea for 3 weeks.  EXAM: CT ANGIOGRAPHY CHEST WITH CONTRAST  TECHNIQUE: Multidetector CT imaging of the chest was performed using the standard protocol during bolus administration of intravenous contrast. Multiplanar CT image reconstructions and MIPs were obtained to evaluate the vascular anatomy.  CONTRAST:  164mL OMNIPAQUE IOHEXOL 350 MG/ML SOLN  COMPARISON:  07/19/2014  FINDINGS: Mediastinum: The patient is status post median sternotomy and CABG procedure. Normal heart size. No pericardial effusion. Aortic atherosclerosis noted. Trachea is  patent and appears midline. Normal appearance of the esophagus. No mediastinal or hilar adenopathy. No focal liver abnormalities. The main pulmonary artery is patent. No lobar or segmental pulmonary artery filling defects identified.  Lungs/Pleura: There is no pleural effusion identified. Bilateral upper lobe subsegmental atelectasis noted. Mild lower lobe subsegmental atelectasis also noted. Stable 3 mm nodule in the left lower lobe, image 88/series 6 compatible with a benign process. Small granulomas also noted in the left lower lobe. Right lower lobe granuloma noted.  Upper Abdomen: Mild diffuse hepatic steatosis. Spleen is unremarkable. The adrenal glands are both normal. The visualized portions of the pancreas are unremarkable.  Musculoskeletal: No aggressive lytic or sclerotic bone lesions.  Review of the MIP images confirms the above findings.  IMPRESSION: 1. No evidence for acute pulmonary embolus. 2. Aortic atherosclerosis. 3. Bilateral upper lobe subsegmental atelectasis.   Electronically Signed   By: Lovena Le  Clovis Riley M.D.   On: 12/10/2014 09:19   Dg Chest Port 1 View  12/22/2014   CLINICAL DATA:  Shortness of breath for 1 day  EXAM: PORTABLE CHEST - 1 VIEW  COMPARISON:  Chest radiograph and chest CT December 10, 2014  FINDINGS: Slight scarring in each mid lung is stable. There is no edema or consolidation. Heart size and pulmonary vascularity are normal. No adenopathy. Patient is status post coronary artery bypass grafting.  IMPRESSION: Scattered areas of mild scarring.  No edema or consolidation.   Electronically Signed   By: Lowella Grip III M.D.   On: 12/22/2014 15:15    Microbiology: No results found for this or any previous visit (from the past 240 hour(s)).   Labs: Basic Metabolic Panel:  Recent Labs Lab 12/22/14 1427 12/23/14 0447  NA 138 135  K 4.4 5.3*  CL 103 101  CO2 24 22  GLUCOSE 188* 463*  BUN 16 26*  CREATININE 0.96 1.21  CALCIUM 8.9 8.7*     Recent Labs Lab  12/22/14 1427  WBC 8.4  HGB 11.3*  HCT 35.0*  MCV 81.3  PLT 167   Cardiac Enzymes:  Recent Labs Lab 12/22/14 1427  TROPONINI <0.03   BNP: BNP (last 3 results)  Recent Labs  07/28/14 1405 12/10/14 0623 12/22/14 1427  BNP 292.9* 169.0* 146.0*    ProBNP (last 3 results) No results for input(s): PROBNP in the last 8760 hours.  CBG:  Recent Labs Lab 12/22/14 2041 12/23/14 0742  GLUCAP 303* 407*       Signed:  Kirk Ruths.   12/23/2014, 11:06 AM

## 2014-12-23 NOTE — Progress Notes (Signed)
Pt alert and oriented x4, no complaints of pain or discomfort.  Bed in low position, call bell within reach.  Bed alarms on and functioning.  Assessment done and charted.  Will continue to monitor and do hourly rounding throughout the shift 

## 2014-12-23 NOTE — Progress Notes (Signed)
Discharge: Pt d/c from room via wheelchair, Family member with the pt. Discharge instructions given to the patient and family members.  No questions from pt, reintegrated to the pt to call or go to the ED for chest discomfort. Pt dressed in street clothes and left with discharge papers and prescriptions in hand. IV d/ced, tele removed and no complaints of pain or discomfort. 

## 2015-01-14 ENCOUNTER — Inpatient Hospital Stay: Payer: BLUE CROSS/BLUE SHIELD

## 2015-01-14 ENCOUNTER — Encounter: Payer: Self-pay | Admitting: Internal Medicine

## 2015-01-14 ENCOUNTER — Inpatient Hospital Stay
Admission: EM | Admit: 2015-01-14 | Discharge: 2015-01-15 | DRG: 292 | Disposition: A | Discharge status: Home / Self Care | Payer: BLUE CROSS/BLUE SHIELD | Attending: Internal Medicine | Admitting: Internal Medicine

## 2015-01-14 ENCOUNTER — Emergency Department: Payer: BLUE CROSS/BLUE SHIELD

## 2015-01-14 DIAGNOSIS — Z951 Presence of aortocoronary bypass graft: Secondary | ICD-10-CM | POA: Diagnosis not present

## 2015-01-14 DIAGNOSIS — Z823 Family history of stroke: Secondary | ICD-10-CM | POA: Diagnosis not present

## 2015-01-14 DIAGNOSIS — I5023 Acute on chronic systolic (congestive) heart failure: Principal | ICD-10-CM | POA: Diagnosis present

## 2015-01-14 DIAGNOSIS — I350 Nonrheumatic aortic (valve) stenosis: Secondary | ICD-10-CM | POA: Diagnosis not present

## 2015-01-14 DIAGNOSIS — Z836 Family history of other diseases of the respiratory system: Secondary | ICD-10-CM | POA: Diagnosis not present

## 2015-01-14 DIAGNOSIS — Z794 Long term (current) use of insulin: Secondary | ICD-10-CM | POA: Diagnosis not present

## 2015-01-14 DIAGNOSIS — E119 Type 2 diabetes mellitus without complications: Secondary | ICD-10-CM

## 2015-01-14 DIAGNOSIS — I5021 Acute systolic (congestive) heart failure: Secondary | ICD-10-CM | POA: Diagnosis not present

## 2015-01-14 DIAGNOSIS — Z6841 Body Mass Index (BMI) 40.0 and over, adult: Secondary | ICD-10-CM | POA: Diagnosis not present

## 2015-01-14 DIAGNOSIS — Z8249 Family history of ischemic heart disease and other diseases of the circulatory system: Secondary | ICD-10-CM | POA: Diagnosis not present

## 2015-01-14 DIAGNOSIS — M79662 Pain in left lower leg: Secondary | ICD-10-CM | POA: Diagnosis present

## 2015-01-14 DIAGNOSIS — J449 Chronic obstructive pulmonary disease, unspecified: Secondary | ICD-10-CM | POA: Diagnosis present

## 2015-01-14 DIAGNOSIS — R0602 Shortness of breath: Secondary | ICD-10-CM | POA: Diagnosis present

## 2015-01-14 DIAGNOSIS — G4733 Obstructive sleep apnea (adult) (pediatric): Secondary | ICD-10-CM | POA: Diagnosis present

## 2015-01-14 DIAGNOSIS — Z7982 Long term (current) use of aspirin: Secondary | ICD-10-CM | POA: Diagnosis not present

## 2015-01-14 DIAGNOSIS — I252 Old myocardial infarction: Secondary | ICD-10-CM

## 2015-01-14 DIAGNOSIS — Z833 Family history of diabetes mellitus: Secondary | ICD-10-CM

## 2015-01-14 DIAGNOSIS — I2510 Atherosclerotic heart disease of native coronary artery without angina pectoris: Secondary | ICD-10-CM | POA: Diagnosis present

## 2015-01-14 DIAGNOSIS — I480 Paroxysmal atrial fibrillation: Secondary | ICD-10-CM | POA: Diagnosis present

## 2015-01-14 DIAGNOSIS — I10 Essential (primary) hypertension: Secondary | ICD-10-CM | POA: Diagnosis present

## 2015-01-14 DIAGNOSIS — I4891 Unspecified atrial fibrillation: Secondary | ICD-10-CM | POA: Diagnosis present

## 2015-01-14 DIAGNOSIS — M79605 Pain in left leg: Secondary | ICD-10-CM

## 2015-01-14 DIAGNOSIS — I509 Heart failure, unspecified: Secondary | ICD-10-CM

## 2015-01-14 DIAGNOSIS — Z9989 Dependence on other enabling machines and devices: Secondary | ICD-10-CM

## 2015-01-14 HISTORY — DX: Atherosclerotic heart disease of native coronary artery without angina pectoris: I25.10

## 2015-01-14 HISTORY — DX: Obstructive sleep apnea (adult) (pediatric): G47.33

## 2015-01-14 HISTORY — DX: Dependence on other enabling machines and devices: Z99.89

## 2015-01-14 HISTORY — DX: Unspecified atrial fibrillation: I48.91

## 2015-01-14 HISTORY — DX: Supraventricular tachycardia: I47.1

## 2015-01-14 HISTORY — DX: Supraventricular tachycardia, unspecified: I47.10

## 2015-01-14 LAB — COMPREHENSIVE METABOLIC PANEL
ALT: 32 U/L (ref 17–63)
ANION GAP: 8 (ref 5–15)
AST: 28 U/L (ref 15–41)
Albumin: 3.4 g/dL — ABNORMAL LOW (ref 3.5–5.0)
Alkaline Phosphatase: 50 U/L (ref 38–126)
BILIRUBIN TOTAL: 0.4 mg/dL (ref 0.3–1.2)
BUN: 20 mg/dL (ref 6–20)
CHLORIDE: 107 mmol/L (ref 101–111)
CO2: 25 mmol/L (ref 22–32)
CREATININE: 0.9 mg/dL (ref 0.61–1.24)
Calcium: 8.6 mg/dL — ABNORMAL LOW (ref 8.9–10.3)
GFR calc Af Amer: >60 mL/min (ref >60)
Glucose, Bld: 224 mg/dL — ABNORMAL HIGH (ref 65–99)
Potassium: 4.1 mmol/L (ref 3.5–5.1)
Sodium: 140 mmol/L (ref 135–145)
Total Protein: 6.8 g/dL (ref 6.5–8.1)

## 2015-01-14 LAB — TROPONIN I: Troponin I: <0.03 ng/mL (ref <0.031)

## 2015-01-14 LAB — GLUCOSE, CAPILLARY
Glucose-Capillary: 127 mg/dL — ABNORMAL HIGH (ref 65–99)
Glucose-Capillary: 169 mg/dL — ABNORMAL HIGH (ref 65–99)
Glucose-Capillary: 211 mg/dL — ABNORMAL HIGH (ref 65–99)

## 2015-01-14 LAB — CBC
HCT: 32.9 % — ABNORMAL LOW (ref 40.0–52.0)
Hemoglobin: 10.7 g/dL — ABNORMAL LOW (ref 13.0–18.0)
MCH: 26.2 pg (ref 26.0–34.0)
MCHC: 32.4 g/dL (ref 32.0–36.0)
MCV: 80.8 fL (ref 80.0–100.0)
Platelets: 136 10*3/uL — ABNORMAL LOW (ref 150–440)
RBC: 4.07 MIL/uL — ABNORMAL LOW (ref 4.40–5.90)
RDW: 17.1 % — AB (ref 11.5–14.5)
WBC: 5.6 10*3/uL (ref 3.8–10.6)

## 2015-01-14 LAB — BRAIN NATRIURETIC PEPTIDE: B Natriuretic Peptide: 202 pg/mL — ABNORMAL HIGH (ref 0.0–100.0)

## 2015-01-14 MED ORDER — FUROSEMIDE 10 MG/ML IJ SOLN
40.0000 mg | Freq: Once | INTRAMUSCULAR | Status: AC
  Administered 2015-01-14: 40 mg via INTRAVENOUS
  Filled 2015-01-14: qty 4

## 2015-01-14 MED ORDER — METFORMIN HCL 500 MG PO TABS
1000.0000 mg | ORAL_TABLET | Freq: Two times a day (BID) | ORAL | Status: DC
  Administered 2015-01-14 – 2015-01-15 (×2): 1000 mg via ORAL
  Filled 2015-01-14 (×2): qty 2

## 2015-01-14 MED ORDER — INSULIN ASPART 100 UNIT/ML ~~LOC~~ SOLN
0.0000 [IU] | Freq: Three times a day (TID) | SUBCUTANEOUS | Status: DC
  Administered 2015-01-14: 3 [IU] via SUBCUTANEOUS
  Administered 2015-01-14 – 2015-01-15 (×2): 2 [IU] via SUBCUTANEOUS
  Filled 2015-01-14: qty 3
  Filled 2015-01-14 (×2): qty 2

## 2015-01-14 MED ORDER — SODIUM CHLORIDE 0.9 % IJ SOLN
3.0000 mL | Freq: Two times a day (BID) | INTRAMUSCULAR | Status: DC
  Administered 2015-01-14 (×2): 3 mL via INTRAVENOUS

## 2015-01-14 MED ORDER — ZOLPIDEM TARTRATE 5 MG PO TABS
5.0000 mg | ORAL_TABLET | Freq: Every evening | ORAL | Status: DC | PRN
  Administered 2015-01-14: 5 mg via ORAL
  Filled 2015-01-14: qty 1

## 2015-01-14 MED ORDER — MONTELUKAST SODIUM 10 MG PO TABS
10.0000 mg | ORAL_TABLET | Freq: Every day | ORAL | Status: DC
Start: 2015-01-14 — End: 2015-01-15
  Administered 2015-01-14: 10 mg via ORAL
  Filled 2015-01-14: qty 1

## 2015-01-14 MED ORDER — FUROSEMIDE 40 MG PO TABS
40.0000 mg | ORAL_TABLET | Freq: Two times a day (BID) | ORAL | Status: DC
  Administered 2015-01-15: 40 mg via ORAL
  Filled 2015-01-14: qty 1

## 2015-01-14 MED ORDER — AMIODARONE HCL 200 MG PO TABS
200.0000 mg | ORAL_TABLET | Freq: Every day | ORAL | Status: DC
  Administered 2015-01-14: 200 mg via ORAL
  Filled 2015-01-14 (×2): qty 1

## 2015-01-14 MED ORDER — DILTIAZEM HCL ER COATED BEADS 180 MG PO CP24
300.0000 mg | ORAL_CAPSULE | Freq: Every day | ORAL | Status: DC
  Administered 2015-01-14: 300 mg via ORAL
  Filled 2015-01-14 (×2): qty 1

## 2015-01-14 MED ORDER — ALBUTEROL SULFATE (2.5 MG/3ML) 0.083% IN NEBU: 2.5000 mg | INHALATION_SOLUTION | Freq: Four times a day (QID) | RESPIRATORY_TRACT | Status: DC | PRN

## 2015-01-14 MED ORDER — ACETAMINOPHEN 325 MG PO TABS: 650.0000 mg | ORAL_TABLET | Freq: Four times a day (QID) | ORAL | Status: DC | PRN

## 2015-01-14 MED ORDER — SIMVASTATIN 40 MG PO TABS
40.0000 mg | ORAL_TABLET | Freq: Every day | ORAL | Status: DC
  Administered 2015-01-14: 40 mg via ORAL
  Filled 2015-01-14 (×2): qty 1

## 2015-01-14 MED ORDER — BUDESONIDE-FORMOTEROL FUMARATE 160-4.5 MCG/ACT IN AERO
2.0000 | INHALATION_SPRAY | Freq: Two times a day (BID) | RESPIRATORY_TRACT | Status: DC
  Administered 2015-01-14: 2 via RESPIRATORY_TRACT
  Filled 2015-01-14: qty 6

## 2015-01-14 MED ORDER — ASPIRIN EC 325 MG PO TBEC
325.0000 mg | DELAYED_RELEASE_TABLET | Freq: Every day | ORAL | Status: DC
  Administered 2015-01-14: 325 mg via ORAL
  Filled 2015-01-14 (×2): qty 1

## 2015-01-14 MED ORDER — LABETALOL HCL 5 MG/ML IV SOLN
10.0000 mg | INTRAVENOUS | Status: DC | PRN
  Administered 2015-01-14: 10 mg via INTRAVENOUS
  Filled 2015-01-14: qty 4

## 2015-01-14 MED ORDER — PANTOPRAZOLE SODIUM 40 MG PO TBEC
40.0000 mg | DELAYED_RELEASE_TABLET | Freq: Every day | ORAL | Status: DC
Start: 2015-01-14 — End: 2015-01-15
  Administered 2015-01-14: 40 mg via ORAL
  Filled 2015-01-14 (×2): qty 1

## 2015-01-14 MED ORDER — ACETAMINOPHEN 650 MG RE SUPP: 650.0000 mg | Freq: Four times a day (QID) | RECTAL | Status: DC | PRN

## 2015-01-14 MED ORDER — METOPROLOL TARTRATE 25 MG PO TABS
25.0000 mg | ORAL_TABLET | Freq: Two times a day (BID) | ORAL | Status: DC
  Administered 2015-01-14 (×2): 25 mg via ORAL
  Filled 2015-01-14 (×3): qty 1

## 2015-01-14 MED ORDER — RIVAROXABAN 20 MG PO TABS
20.0000 mg | ORAL_TABLET | Freq: Every day | ORAL | Status: DC
  Administered 2015-01-14: 20 mg via ORAL
  Filled 2015-01-14 (×2): qty 1

## 2015-01-14 MED ORDER — HYDRALAZINE HCL 20 MG/ML IJ SOLN: 10.0000 mg | INTRAMUSCULAR | Status: DC | PRN

## 2015-01-14 NOTE — ED Notes (Signed)
MD at bedside. 

## 2015-01-14 NOTE — Progress Notes (Signed)
ANTICOAGULATION CONSULT NOTE - Initial Consult  Pharmacy Consult for Xarelto  Indication: atrial fibrillation  No Known Allergies  Patient Measurements: Height: 5\' 9"  (175.3 cm) Weight: 290 lb (131.543 kg) IBW/kg (Calculated) : 70.7 Heparin Dosing Weight:   Vital Signs: Temp: 98.9 F (37.2 C) (07/31 1404) Temp Source: Oral (07/31 1404) BP: 162/68 mmHg (07/31 1423) Pulse Rate: 63 (07/31 1423)  Labs:  Recent Labs  01/14/15 0925 01/14/15 1443  HGB 10.7*  --   HCT 32.9*  --   PLT 136*  --   CREATININE 0.90  --   TROPONINI <0.03 <0.03    Estimated Creatinine Clearance: 118.8 mL/min (by C-G formula based on Cr of 0.9).   Medical History: Past Medical History  Diagnosis Date  . MI (myocardial infarction)   . Sepsis   . History of cardioversion   . Diabetes mellitus without complication   . Hypertension   . CAD (coronary artery disease)   . SVT (supraventricular tachycardia)   . OSA on CPAP   . A-fib   . Chronic systolic CHF (congestive heart failure)     Medications:  Prescriptions prior to admission  Medication Sig Dispense Refill Last Dose  . albuterol (PROVENTIL HFA;VENTOLIN HFA) 108 (90 BASE) MCG/ACT inhaler Inhale 2 puffs into the lungs every 6 (six) hours as needed for wheezing or shortness of breath. 1 Inhaler 0 Past Week at Unknown time  . amiodarone (PACERONE) 200 MG tablet Take 200 mg by mouth daily.   01/13/2015 at Unknown time  . aspirin EC 325 MG tablet Take 325 mg by mouth daily.   01/13/2015 at Unknown time  . budesonide-formoterol (SYMBICORT) 160-4.5 MCG/ACT inhaler Inhale 2 puffs into the lungs 2 (two) times daily.   01/13/2015 at Unknown time  . diltiazem (CARDIZEM CD) 300 MG 24 hr capsule Take 1 capsule (300 mg total) by mouth daily. 30 capsule 5 01/13/2015 at Unknown time  . fluticasone (FLONASE) 50 MCG/ACT nasal spray Place 1 spray into both nostrils daily.   01/13/2015 at Unknown time  . furosemide (LASIX) 40 MG tablet Take 1 tablet (40 mg total)  by mouth 2 (two) times daily. 60 tablet 5 01/13/2015 at Unknown time  . glimepiride (AMARYL) 4 MG tablet Take 4 mg by mouth daily.   01/13/2015 at Unknown time  . insulin aspart (NOVOLOG) 100 UNIT/ML injection Inject 40 Units into the skin daily.    01/13/2015 at Unknown time  . Ipratropium-Albuterol (ALBUTEROL-IPRATROPIUM IN) Inhale 2.5 mg into the lungs 4 (four) times daily. 2.5mg -0.5mg /91ml inhalation 4 times a day   01/13/2015 at Unknown time  . LORazepam (ATIVAN) 1 MG tablet Take 1 mg by mouth every 4 (four) hours as needed for anxiety.   01/13/2015 at Unknown time  . metFORMIN (GLUCOPHAGE) 1000 MG tablet Take 1,000 mg by mouth 2 (two) times daily with a meal.   01/13/2015 at Unknown time  . metoprolol tartrate (LOPRESSOR) 25 MG tablet Take 1 tablet (25 mg total) by mouth 2 (two) times daily. 60 tablet 5 01/13/2015 at Unknown time  . montelukast (SINGULAIR) 10 MG tablet Take 10 mg by mouth at bedtime.   01/13/2015 at Unknown time  . nitroGLYCERIN (NITRODUR - DOSED IN MG/24 HR) 0.6 mg/hr patch Place 0.6 mg onto the skin daily.   01/13/2015 at Unknown time  . nitroGLYCERIN (NITROSTAT) 0.4 MG SL tablet Place 0.4 mg under the tongue every 5 (five) minutes as needed for chest pain.   unknown at unknown  . pantoprazole (PROTONIX) 40  MG tablet Take 40 mg by mouth daily.   01/13/2015 at Unknown time  . pramipexole (MIRAPEX) 0.5 MG tablet Take 0.5 mg by mouth at bedtime.   01/13/2015 at Unknown time  . pregabalin (LYRICA) 75 MG capsule Take 75 mg by mouth 2 (two) times daily.   01/13/2015 at Unknown time  . Rivaroxaban (XARELTO) 15 MG TABS tablet Take 15 mg by mouth daily with supper.   01/13/2015 at Unknown time  . simvastatin (ZOCOR) 40 MG tablet Take 40 mg by mouth daily.   01/13/2015 at Unknown time  . traZODone (DESYREL) 100 MG tablet Take 100 mg by mouth at bedtime.   01/13/2015 at Unknown time  . predniSONE (DELTASONE) 20 MG tablet Take 3 tablets (60 mg total) by mouth once. (Patient not taking: Reported on  12/22/2014) 12 tablet 0 Completed Course at Unknown time    Assessment: Pt with A fib , being treated with Xarelto 15 mg PO daily per home med list.  Goal of Therapy:  prevention of systemic embolus   Plan:  Xarelto 15 mg PO daily originally ordered.  Will adjust dose to Xarelto 20 mg PO daily per indication.  OK'd with Dr Posey Pronto.   Manvir Thorson D 01/14/2015,3:59 PM

## 2015-01-14 NOTE — H&P (Signed)
[facility] NAME: Marcus Williams    MR#:  [id number]  DATE OF BIRTH:  [birth year]  DATE OF ADMISSION:  01/14/2015  PRIMARY CARE PHYSICIAN: Idelle Crouch, MD   REQUESTING/REFERRING PHYSICIAN: Kinner  CHIEF COMPLAINT:   Chief Complaint  Patient presents with  . Shortness of Breath    HISTORY OF PRESENT ILLNESS:  Marcus Williams  is a 59 y.o. male who presents with progressive shortness of breath, specifically dyspnea on exertion. Patient states that his shortness of breath has been progressing over the last several months, however last 3-4 days has gotten acutely noticeably worse. Patient denies any knowledge of a prior formal diagnosis of congestive heart failure, on workup in the ED today his BNP was elevated, and on chart review he had an echocardiogram in December 2015 which showed an ejection fraction of 50%. He does have a history of coronary artery disease with prior MI, status post bypass surgery. Chest x-ray today showed pulmonary vascular congestion with some pulmonary edema. Patient states he has had some chest pain, though it is more associated with his breathing when he is out of breath. He states he does not feel that it is the same pain that he had when he had his heart attack.  PAST MEDICAL HISTORY:   Past Medical History  Diagnosis Date  . MI (myocardial infarction)   . Sepsis   . History of cardioversion   . Diabetes mellitus without complication   . Hypertension   . CAD (coronary artery disease)   . SVT (supraventricular tachycardia)   . OSA on CPAP   . A-fib   . Chronic systolic CHF (congestive heart failure)     PAST SURGICAL HISTORY:   Past Surgical History  Procedure Laterality Date  . Quadruple bypass    . Knee surgery      SOCIAL HISTORY:   History  Substance Use Topics  . Smoking status: Never Smoker   . Smokeless tobacco: Not on file  . Alcohol Use: Yes     Comment: occasionally     FAMILY HISTORY:   Family History  Problem Relation Age of Onset  . Stroke Mother   . Heart attack Mother   . Hypertension Mother   . Heart attack Father   . Hypertension Father   . Heart attack Brother     #1  . Diabetes Brother     #1  . Heart disease Brother     #2  . Lung disease Brother     #2  . Hypertension Brother     #2  . Diabetes Brother     #2    DRUG ALLERGIES:  No Known Allergies  MEDICATIONS AT HOME:   Prior to Admission medications   Medication Sig Start Date End Date Taking? Authorizing Provider  albuterol (PROVENTIL HFA;VENTOLIN HFA) 108 (90 BASE) MCG/ACT inhaler Inhale 2 puffs into the lungs every 6 (six) hours as needed for wheezing or shortness of breath. 12/10/14  Yes Lisa Roca, MD  amiodarone (PACERONE) 200 MG tablet Take 200 mg by mouth daily.   Yes Historical Provider, MD  aspirin EC 325 MG tablet Take 325 mg by mouth daily.   Yes Historical Provider, MD  budesonide-formoterol (SYMBICORT) 160-4.5 MCG/ACT inhaler Inhale 2 puffs into the lungs 2 (two) times daily.   Yes Historical Provider, MD  diltiazem (CARDIZEM CD) 300 MG 24 hr capsule Take 1 capsule (300 mg total) by mouth daily. 10/15/14  Yes Idelle Crouch, MD  fluticasone (FLONASE) 50 MCG/ACT nasal spray Place 1 spray into both nostrils daily.   Yes Historical Provider, MD  furosemide (LASIX) 40 MG tablet Take 1 tablet (40 mg total) by mouth 2 (two) times daily. 10/15/14  Yes Idelle Crouch, MD  glimepiride (AMARYL) 4 MG tablet Take 4 mg by mouth daily.   Yes Historical Provider, MD  insulin aspart (NOVOLOG) 100 UNIT/ML injection Inject 40 Units into the skin daily.    Yes Historical Provider, MD  Ipratropium-Albuterol (ALBUTEROL-IPRATROPIUM IN) Inhale 2.5 mg into the lungs 4 (four) times daily. 2.5mg -0.5mg /37ml inhalation 4 times a day   Yes Historical Provider, MD  LORazepam (ATIVAN) 1 MG tablet Take 1 mg by mouth every 4 (four) hours as needed for anxiety.   Yes Historical Provider, MD   metFORMIN (GLUCOPHAGE) 1000 MG tablet Take 1,000 mg by mouth 2 (two) times daily with a meal.   Yes Historical Provider, MD  metoprolol tartrate (LOPRESSOR) 25 MG tablet Take 1 tablet (25 mg total) by mouth 2 (two) times daily. 10/15/14  Yes Idelle Crouch, MD  montelukast (SINGULAIR) 10 MG tablet Take 10 mg by mouth at bedtime.   Yes Historical Provider, MD  nitroGLYCERIN (NITRODUR - DOSED IN MG/24 HR) 0.6 mg/hr patch Place 0.6 mg onto the skin daily.   Yes Historical Provider, MD  nitroGLYCERIN (NITROSTAT) 0.4 MG SL tablet Place 0.4 mg under the tongue every 5 (five) minutes as needed for chest pain.   Yes Historical Provider, MD  pantoprazole (PROTONIX) 40 MG tablet Take 40 mg by mouth daily.   Yes Historical Provider, MD  pramipexole (MIRAPEX) 0.5 MG tablet Take 0.5 mg by mouth at bedtime.   Yes Historical Provider, MD  pregabalin (LYRICA) 75 MG capsule Take 75 mg by mouth 2 (two) times daily.   Yes Historical Provider, MD  Rivaroxaban (XARELTO) 15 MG TABS tablet Take 15 mg by mouth daily with supper.   Yes Historical Provider, MD  simvastatin (ZOCOR) 40 MG tablet Take 40 mg by mouth daily.   Yes Historical Provider, MD  traZODone (DESYREL) 100 MG tablet Take 100 mg by mouth at bedtime.   Yes Historical Provider, MD  predniSONE (DELTASONE) 20 MG tablet Take 3 tablets (60 mg total) by mouth once. Patient not taking: Reported on 12/22/2014 12/10/14   Lisa Roca, MD    REVIEW OF SYSTEMS:  Review of Systems  Constitutional: Negative for fever, chills, weight loss and malaise/fatigue.  HENT: Negative for ear pain, hearing loss and tinnitus.   Eyes: Negative for blurred vision, double vision, pain and redness.  Respiratory: Positive for shortness of breath. Negative for cough, hemoptysis, sputum production and wheezing.   Cardiovascular: Positive for chest pain. Negative for palpitations, orthopnea and leg swelling.  Gastrointestinal: Negative for nausea, vomiting, abdominal pain, diarrhea and  constipation.  Genitourinary: Negative for dysuria, frequency and hematuria.  Musculoskeletal: Negative for back pain, joint pain and neck pain.       Left lower extremity calf pain and a "knot"  Skin:       No acne, rash, or lesions  Neurological: Negative for dizziness, tremors, focal weakness and weakness.  Endo/Heme/Allergies: Negative for polydipsia. Does not bruise/bleed easily.  Psychiatric/Behavioral: Negative for depression. The patient is not nervous/anxious and does not have insomnia.      VITAL SIGNS:   Filed Vitals:   01/14/15 1030 01/14/15 1100 01/14/15 1125 01/14/15 1130  BP: 149/65 176/68  167/65  Pulse: 60 64  68  Temp:      TempSrc:      Resp: 24 24  18   Height:      Weight:      SpO2: 94% 95% 91% 97%   Wt Readings from Last 3 Encounters:  01/14/15 131.543 kg (290 lb)  12/22/14 113.762 kg (250 lb 12.8 oz)  12/10/14 127.007 kg (280 lb)    PHYSICAL EXAMINATION:  Physical Exam  Vitals reviewed. Constitutional: He is oriented to person, place, and time. He appears well-developed and well-nourished. No distress.  HENT:  Head: Normocephalic and atraumatic.  Mouth/Throat: Oropharynx is clear and moist.  Eyes: Conjunctivae and EOM are normal. Pupils are equal, round, and reactive to light. No scleral icterus.  Neck: Normal range of motion. Neck supple. No JVD present. No thyromegaly present.  Cardiovascular: Normal rate, regular rhythm and intact distal pulses.  Exam reveals no gallop and no friction rub.   No murmur heard. Respiratory: Breath sounds normal. No respiratory distress. He has no wheezes. He has no rales.  Patient is somewhat out of breath with movement in bed. Able to speak in full sentences, though somewhat winded doing so.  GI: Soft. Bowel sounds are normal. He exhibits no distension. There is no tenderness.  Musculoskeletal: Normal range of motion. He exhibits edema (bilateral lower extremity) and tenderness (left calf with mild tenderness to  palpation).  Patient has a palpable area of tissue density in his left calf, No arthritis, no gout  Lymphadenopathy:    He has no cervical adenopathy.  Neurological: He is alert and oriented to person, place, and time. No cranial nerve deficit.  No dysarthria, no aphasia  Skin: Skin is warm and dry. No rash noted. There is erythema (Bilateral lower extremity stasis changes).  Psychiatric: He has a normal mood and affect. His behavior is normal. Judgment and thought content normal.    LABORATORY PANEL:   CBC  Recent Labs Lab 01/14/15 0925  WBC 5.6  HGB 10.7*  HCT 32.9*  PLT 136*   ------------------------------------------------------------------------------------------------------------------  Chemistries   Recent Labs Lab 01/14/15 0925  NA 140  K 4.1  CL 107  CO2 25  GLUCOSE 224*  BUN 20  CREATININE 0.90  CALCIUM 8.6*  AST 28  ALT 32  ALKPHOS 50  BILITOT 0.4   ------------------------------------------------------------------------------------------------------------------  Cardiac Enzymes  Recent Labs Lab 01/14/15 0925  TROPONINI <0.03   ------------------------------------------------------------------------------------------------------------------  RADIOLOGY:  Dg Chest 2 View  01/14/2015   CLINICAL DATA:  Shortness of breath for 2 weeks.  EXAM: CHEST  2 VIEW  COMPARISON:  Seventy-two thousand sixteen and prior radiographs  FINDINGS: Cardiomegaly and CABG changes again noted.  Pulmonary vascular congestion is again identified with trace amount of right pleural fluid.  Mild interstitial opacities could represent mild interstitial edema.  There is no evidence of left pleural effusion, pneumothorax, or pulmonary mass.  No acute bony abnormalities are identified.  IMPRESSION: Cardiomegaly with pulmonary vascular congestion and possible mild interstitial edema.   Electronically Signed   By: Margarette Canada M.D.   On: 01/14/2015 08:22    EKG:   Orders placed or  performed during the hospital encounter of 01/14/15  . ED EKG  . ED EKG    IMPRESSION AND PLAN:  Principal Problem:   Acute on chronic systolic heart failure - as denoted by clinical symptoms, chest x-ray, elevated BNP. Given IV Lasix in the ED, we'll give another dose later this afternoon, with morning chest x-ray to look for improvement in  his pulmonary congestion and pulmonary edema. We'll consult cardiology, control his blood pressure, and get an echocardiogram Active Problems:   HTN (hypertension) - elevated here, this is also likely contributing to his heart failure, continue home antihypertensives, add IV when necessary antihypertensives here to keep his blood pressure less than 160/100, additional diuresis he is receiving also assist with this.   Type II diabetes mellitus - carb controlled/heart healthy diet here, sliding scale insulin with before meals at bedtime glucose checks and continue home metformin, check hemoglobin A1c   COPD (chronic obstructive pulmonary disease) - continue home inhalers   OSA on CPAP - CPAP at night   CAD (coronary artery disease) - continue home medications, cardiac enzymes negative so far, given some complaint of chest pain we will trend his enzymes   A-fib - sinus rhythm at this time, continue antiarrhythmics and Xarelto   Pain of left calf - unlikely DVT given that the patient is on systemic anticoagulation, however given his pain and physical exam findings will get an ultrasound of his left lower extremity   All the records are reviewed and case discussed with ED provider. Management plans discussed with the patient and/or family.  DVT PROPHYLAXIS: systemic anticoagulation  ADMISSION STATUS: Observation  CODE STATUS: Full  TOTAL TIME TAKING CARE OF THIS PATIENT: 45 minutes.    Haskel Dewalt [place] 01/14/2015, 12:40 PM  [facility]  Office  [phone]  CC: Primary care physician; Idelle Crouch, MD

## 2015-01-14 NOTE — Consult Note (Signed)
[facility] Consultation Note  Patient ID: Marcus Williams, MRN: [id number], DOB/AGE: 1956/02/14 59 y.o. Admit date: 01/14/2015   Date of Consult: 01/14/2015 Primary Physician: Idelle Crouch, MD Primary Cardiologist: Nehemiah Massed  Chief Complaint:  Chief Complaint  Patient presents with  . Shortness of Breath   Reason for Consult: acute shortness of breath with known coronary artery disease and previous myocardial infarction  HPI: 59 y.o. male with known coronary artery disease status post coronary artery bypass graft and old myocardial infarction with diabetes with complication sleep apnea atrial fibrillation nonvalvular in nature currently in normal sinus rhythm with essential hypertension having significant progression of shortness of breath over a one to 2 day period for which she could not stand it anymore with some mild chest pressure. EKG has shown normal sinus rhythm with left ventricular hypertrophy and troponin has been normal without evidence of myocardial infarction. There is been no evidence of congestive heart failure or other signs or symptoms of worsening COPD as well as no current evidence of the area edema. It does appear that the patient has had significant cardiovascular history on appropriate medication management for hypertension control including metoprolol hydralazine. He also has been on furosemide and diltiazem for lower extremity edema and diltiazem plus amiodarone has To the patient in normal sinus rhythm. There is concerns that the patient may have significant side effects of amiodarone toxicity  Past Medical History  Diagnosis Date  . MI (myocardial infarction)   . Sepsis   . History of cardioversion   . Diabetes mellitus without complication   . Hypertension   . CAD (coronary artery disease)   . SVT (supraventricular tachycardia)   . OSA on CPAP   . A-fib   . Chronic systolic CHF (congestive heart failure)       Surgical History:  Past Surgical  History  Procedure Laterality Date  . Quadruple bypass    . Knee surgery       Home Meds: Prior to Admission medications   Medication Sig Start Date End Date Taking? Authorizing Provider  albuterol (PROVENTIL HFA;VENTOLIN HFA) 108 (90 BASE) MCG/ACT inhaler Inhale 2 puffs into the lungs every 6 (six) hours as needed for wheezing or shortness of breath. 12/10/14  Yes Lisa Roca, MD  amiodarone (PACERONE) 200 MG tablet Take 200 mg by mouth daily.   Yes Historical Provider, MD  aspirin EC 325 MG tablet Take 325 mg by mouth daily.   Yes Historical Provider, MD  budesonide-formoterol (SYMBICORT) 160-4.5 MCG/ACT inhaler Inhale 2 puffs into the lungs 2 (two) times daily.   Yes Historical Provider, MD  diltiazem (CARDIZEM CD) 300 MG 24 hr capsule Take 1 capsule (300 mg total) by mouth daily. 10/15/14  Yes Idelle Crouch, MD  fluticasone (FLONASE) 50 MCG/ACT nasal spray Place 1 spray into both nostrils daily.   Yes Historical Provider, MD  furosemide (LASIX) 40 MG tablet Take 1 tablet (40 mg total) by mouth 2 (two) times daily. 10/15/14  Yes Idelle Crouch, MD  glimepiride (AMARYL) 4 MG tablet Take 4 mg by mouth daily.   Yes Historical Provider, MD  insulin aspart (NOVOLOG) 100 UNIT/ML injection Inject 40 Units into the skin daily.    Yes Historical Provider, MD  Ipratropium-Albuterol (ALBUTEROL-IPRATROPIUM IN) Inhale 2.5 mg into the lungs 4 (four) times daily. 2.5mg -0.5mg /55ml inhalation 4 times a day   Yes Historical Provider, MD  LORazepam (ATIVAN) 1 MG tablet Take 1 mg by mouth every 4 (four) hours as needed for  anxiety.   Yes Historical Provider, MD  metFORMIN (GLUCOPHAGE) 1000 MG tablet Take 1,000 mg by mouth 2 (two) times daily with a meal.   Yes Historical Provider, MD  metoprolol tartrate (LOPRESSOR) 25 MG tablet Take 1 tablet (25 mg total) by mouth 2 (two) times daily. 10/15/14  Yes Idelle Crouch, MD  montelukast (SINGULAIR) 10 MG tablet Take 10 mg by mouth at bedtime.   Yes Historical  Provider, MD  nitroGLYCERIN (NITRODUR - DOSED IN MG/24 HR) 0.6 mg/hr patch Place 0.6 mg onto the skin daily.   Yes Historical Provider, MD  nitroGLYCERIN (NITROSTAT) 0.4 MG SL tablet Place 0.4 mg under the tongue every 5 (five) minutes as needed for chest pain.   Yes Historical Provider, MD  pantoprazole (PROTONIX) 40 MG tablet Take 40 mg by mouth daily.   Yes Historical Provider, MD  pramipexole (MIRAPEX) 0.5 MG tablet Take 0.5 mg by mouth at bedtime.   Yes Historical Provider, MD  pregabalin (LYRICA) 75 MG capsule Take 75 mg by mouth 2 (two) times daily.   Yes Historical Provider, MD  Rivaroxaban (XARELTO) 15 MG TABS tablet Take 15 mg by mouth daily with supper.   Yes Historical Provider, MD  simvastatin (ZOCOR) 40 MG tablet Take 40 mg by mouth daily.   Yes Historical Provider, MD  traZODone (DESYREL) 100 MG tablet Take 100 mg by mouth at bedtime.   Yes Historical Provider, MD  predniSONE (DELTASONE) 20 MG tablet Take 3 tablets (60 mg total) by mouth once. Patient not taking: Reported on 12/22/2014 12/10/14   Lisa Roca, MD    Inpatient Medications:  . amiodarone  200 mg Oral Daily  . aspirin EC  325 mg Oral Daily  . budesonide-formoterol  2 puff Inhalation BID  . diltiazem  300 mg Oral Daily  . [START ON 01/15/2015] furosemide  40 mg Oral BID  . insulin aspart  0-9 Units Subcutaneous TID AC & HS  . metFORMIN  1,000 mg Oral BID WC  . metoprolol tartrate  25 mg Oral BID  . montelukast  10 mg Oral QHS  . pantoprazole  40 mg Oral Daily  . rivaroxaban  20 mg Oral Q supper  . simvastatin  40 mg Oral Daily  . sodium chloride  3 mL Intravenous Q12H      Allergies: No Known Allergies  History   Social History  . Marital Status: Married    Spouse Name: N/A  . Number of Children: N/A  . Years of Education: N/A   Occupational History  . Not on file.   Social History Main Topics  . Smoking status: Never Smoker   . Smokeless tobacco: Not on file  . Alcohol Use: Yes     Comment:  occasionally  . Drug Use: No  . Sexual Activity: Not on file   Other Topics Concern  . Not on file   Social History Narrative     Family History  Problem Relation Age of Onset  . Stroke Mother   . Heart attack Mother   . Hypertension Mother   . Heart attack Father   . Hypertension Father   . Heart attack Brother     #1  . Diabetes Brother     #1  . Heart disease Brother     #2  . Lung disease Brother     #2  . Hypertension Brother     #2  . Diabetes Brother     #2     Review  of Systems Positive for shortness of breath Negative for: General:  chills, fever, night sweats or weight changes.  Cardiovascular: PND orthopnea syncope dizziness  Dermatological skin lesions rashes Respiratory: Cough congestion Urologic: Frequent urination urination at night and hematuria Abdominal: negative for nausea, vomiting, diarrhea, bright red blood per rectum, melena, or hematemesis Neurologic: negative for visual changes, and/or hearing changes  All other systems reviewed and are otherwise negative except as noted above.  Labs:  Recent Labs  01/14/15 0925 01/14/15 1443  TROPONINI <0.03 <0.03   Lab Results  Component Value Date   WBC 5.6 01/14/2015   HGB 10.7* 01/14/2015   HCT 32.9* 01/14/2015   MCV 80.8 01/14/2015   PLT 136* 01/14/2015    Recent Labs Lab 01/14/15 0925  NA 140  K 4.1  CL 107  CO2 25  BUN 20  CREATININE 0.90  CALCIUM 8.6*  PROT 6.8  BILITOT 0.4  ALKPHOS 50  ALT 32  AST 28  GLUCOSE 224*   Lab Results  Component Value Date   CHOL 104 07/20/2014   HDL 43 07/20/2014   LDLCALC 35 07/20/2014   TRIG 131 07/20/2014   No results found for: DDIMER  Radiology/Studies:  Dg Chest 2 View  01/14/2015   CLINICAL DATA:  Shortness of breath for 2 weeks.  EXAM: CHEST  2 VIEW  COMPARISON:  Sev[age]-two thousand sixteen and prior radiographs  FINDINGS: Cardiomegaly and CABG changes again noted.  Pulmonary vascular congestion is again identified with trace  amount of right pleural fluid.  Mild interstitial opacities could represent mild interstitial edema.  There is no evidence of left pleural effusion, pneumothorax, or pulmonary mass.  No acute bony abnormalities are identified.  IMPRESSION: Cardiomegaly with pulmonary vascular congestion and possible mild interstitial edema.   Electronically Signed   By: Margarette Canada M.D.   On: 01/14/2015 08:22   Ct Head Wo Contrast  12/21/2014   CLINICAL DATA:  Shortness of breath for 1 month getting worse, severe intermittent headaches lasting for 3 hours or more  EXAM: CT HEAD WITHOUT CONTRAST  TECHNIQUE: Contiguous axial images were obtained from the base of the skull through the vertex without intravenous contrast.  COMPARISON:  08/14/2014  FINDINGS: Bilateral age-related calcification of the basal ganglia. Mild diffuse age-related cortical atrophy. No evidence of mass or infarct. No hydrocephalus hemorrhage or extra-axial fluid. Calvarium intact. No significant inflammatory change in the visualized portions of the paranasal sinuses.  IMPRESSION: No acute intracranial abnormalities.   Electronically Signed   By: Skipper Cliche M.D.   On: 12/21/2014 13:05   US Venous Img Lower Unilateral Left  01/14/2015   CLINICAL DATA:  Left calf pain for 12 hours.  EXAM: LEFT LOWER EXTREMITY VENOUS DOPPLER ULTRASOUND  TECHNIQUE: Gray-scale sonography with graded compression, as well as color Doppler and duplex ultrasound were performed to evaluate the lower extremity deep venous systems from the level of the common femoral vein and including the common femoral, femoral, profunda femoral, popliteal and calf veins including the posterior tibial, peroneal and gastrocnemius veins when visible. The superficial great saphenous vein was also interrogated. Spectral Doppler was utilized to evaluate flow at rest and with distal augmentation maneuvers in the common femoral, femoral and popliteal veins.  COMPARISON:  04/21/2012  FINDINGS:  Contralateral Common Femoral Vein: Respiratory phasicity is normal and symmetric with the symptomatic side. No evidence of thrombus. Normal compressibility.  Common Femoral Vein: No evidence of thrombus. Normal compressibility, respiratory phasicity and response to augmentation.  Saphenofemoral Junction:  No evidence of thrombus. Normal compressibility and flow on color Doppler imaging.  Profunda Femoral Vein: No evidence of thrombus. Normal compressibility and flow on color Doppler imaging.  Femoral Vein: No evidence of thrombus. Normal compressibility, respiratory phasicity and response to augmentation.  Popliteal Vein: No evidence of thrombus. Normal compressibility, respiratory phasicity and response to augmentation.  Calf Veins: No evidence of thrombus. Normal compressibility and flow on color Doppler imaging.  Superficial Great Saphenous Vein: No evidence of thrombus. Normal compressibility and flow on color Doppler imaging.  Venous Reflux:  None.  Other Findings:  None.  IMPRESSION: No evidence of deep venous thrombosis.   Electronically Signed   By: Rolm Baptise M.D.   On: 01/14/2015 16:19   Dg Chest Port 1 View  12/22/2014   CLINICAL DATA:  Shortness of breath for 1 day  EXAM: PORTABLE CHEST - 1 VIEW  COMPARISON:  Chest radiograph and chest CT December 10, 2014  FINDINGS: Slight scarring in each mid lung is stable. There is no edema or consolidation. Heart size and pulmonary vascularity are normal. No adenopathy. Patient is status post coronary artery bypass grafting.  IMPRESSION: Scattered areas of mild scarring.  No edema or consolidation.   Electronically Signed   By: Lowella Grip III M.D.   On: 12/22/2014 15:15    EKG: Normal sinus rhythm with left ventricular hypertrophy  Weights: Filed Weights   01/14/15 0744  Weight: 290 lb (131.543 kg)     Physical Exam: Blood pressure 162/68, pulse 63, temperature 98.9 F (37.2 C), temperature source Oral, resp. rate 22, height 5\' 9"  (1.753 m),  weight 290 lb (131.543 kg), SpO2 96 %. Body mass index is 42.81 kg/(m^2). General: Well developed, well nourished, in no acute distress. Head eyes ears nose throat: Normocephalic, atraumatic, sclera non-icteric, no xanthomas, nares are without discharge. No apparent thyromegaly and/or mass  Lungs: Normal respiratory effort.  no wheezes, no rales, no rhonchi.  Heart: RRR with normal S1 S2. no murmur gallop, no rub, PMI is normal size and placement, carotid upstroke normal without bruit, jugular venous pressure is normal Abdomen: Soft, non-tender, non-distended with normoactive bowel sounds. No hepatomegaly. No rebound/guarding. No obvious abdominal masses. Abdominal aorta is normal size without bruit Extremities: No edema. no cyanosis, no clubbing, no ulcers  Peripheral : 2+ bilateral upper extremity pulses, 2+ bilateral femoral pulses, 2+ bilateral dorsal pedal pulse Neuro: Alert and oriented. No facial asymmetry. No focal deficit. Moves all extremities spontaneously. Musculoskeletal: Normal muscle tone without kyphosis Psych:  Responds to questions appropriately with a normal affect.    Assessment: 59 year old male with known coronary artery disease status post coronary artery bypass graft or myocardial infarction diabetes with complication obstructive sleep apnea nonvalvular atrial fibrillation essential hypertension with shortness of breath of unknown etiology possibly secondary to amiodarone treatment COPD and/or anginal equivalent without evidence of myocardial infarction or EKG changes at this time  Plan: 1. Continue surveillance of the CG and enzymes to assess for possible myocardial infarction 2. Continue treatment with the diltiazem for nonvalvular atrial fibrillation and consider a discontinuation of amiodarone if able due to concerns of toxicity and consider pulmonary consultation for evaluation of pulmonary causes of shortness of breath 3. Consideration of the stress test for further  evaluation of anginal equivalent without current evidence of myocardial infarction 4. Hypertension control with metoprolol 5. Anticoagulation for further risk reduction in stroke with intermittent atrial fibrillation 6. Further treatment options after above  Signed, Corey Skains M.D. [facility]  01/14/2015, 5:25 PM

## 2015-01-14 NOTE — ED Notes (Signed)
Pt is able to speak in full sentences

## 2015-01-14 NOTE — ED Notes (Signed)
Pitting edema noted to lower extremities , recent admission x3 weeks with same symptoms

## 2015-01-14 NOTE — ED Provider Notes (Signed)
[facility] Emergency Department Provider Note  ____________________________________________  Time seen: 9 AM  I have reviewed the triage vital signs and the nursing notes.   HISTORY  Chief Complaint Shortness of Breath    HPI Marcus Williams is a 59 y.o. male with history of coronary artery bypass, diabetes and hypertension who presents with shortness of breath. He reports he has been feeling short of breath for 3 months but it has become much worse in the last few days. He notes his cardiologist changed his medications recently but it did not help. He sees Dr. Ubaldo Glassing. Does report some chest discomfort currently. No fevers no chills. Short of breath is worse with exertion or lying flat. He does report compliance with his Lasix but notes his legs are swollen bilaterally    Past Medical History  Diagnosis Date  . MI (myocardial infarction)   . Sepsis   . History of cardioversion   . Diabetes mellitus without complication   . Hypertension     Patient Active Problem List   Diagnosis Date Noted  . COPD exacerbation 12/22/2014  . Diabetes 10/14/2014    Past Surgical History  Procedure Laterality Date  . Quadruple bypass    . Knee surgery      Current Outpatient Rx  Name  Route  Sig  Dispense  Refill  . albuterol (PROVENTIL HFA;VENTOLIN HFA) 108 (90 BASE) MCG/ACT inhaler   Inhalation   Inhale 2 puffs into the lungs every 6 (six) hours as needed for wheezing or shortness of breath.   1 Inhaler   0   . amiodarone (PACERONE) 200 MG tablet   Oral   Take 200 mg by mouth daily.         Marland Kitchen aspirin EC 325 MG tablet   Oral   Take 325 mg by mouth daily.         . budesonide-formoterol (SYMBICORT) 160-4.5 MCG/ACT inhaler   Inhalation   Inhale 2 puffs into the lungs 2 (two) times daily.         Marland Kitchen diltiazem (CARDIZEM CD) 300 MG 24 hr capsule   Oral   Take 1 capsule (300 mg total) by mouth daily.   30 capsule   5   . fluticasone (FLONASE) 50  MCG/ACT nasal spray   Each Nare   Place 1 spray into both nostrils daily.         . furosemide (LASIX) 40 MG tablet   Oral   Take 1 tablet (40 mg total) by mouth 2 (two) times daily.   60 tablet   5   . glimepiride (AMARYL) 4 MG tablet   Oral   Take 4 mg by mouth daily.         . insulin aspart (NOVOLOG) 100 UNIT/ML injection   Subcutaneous   Inject 40 Units into the skin daily.          . Ipratropium-Albuterol (ALBUTEROL-IPRATROPIUM IN)   Inhalation   Inhale 2.5 mg into the lungs 4 (four) times daily. 2.5mg -0.5mg /41ml inhalation 4 times a day         . LORazepam (ATIVAN) 1 MG tablet   Oral   Take 1 mg by mouth every 4 (four) hours as needed for anxiety.         . metFORMIN (GLUCOPHAGE) 1000 MG tablet   Oral   Take 1,000 mg by mouth 2 (two) times daily with a meal.         . metoprolol tartrate (LOPRESSOR)  25 MG tablet   Oral   Take 1 tablet (25 mg total) by mouth 2 (two) times daily.   60 tablet   5   . montelukast (SINGULAIR) 10 MG tablet   Oral   Take 10 mg by mouth at bedtime.         . nitroGLYCERIN (NITRODUR - DOSED IN MG/24 HR) 0.6 mg/hr patch   Transdermal   Place 0.6 mg onto the skin daily.         . nitroGLYCERIN (NITROSTAT) 0.4 MG SL tablet   Sublingual   Place 0.4 mg under the tongue every 5 (five) minutes as needed for chest pain.         . pantoprazole (PROTONIX) 40 MG tablet   Oral   Take 40 mg by mouth daily.         . pramipexole (MIRAPEX) 0.5 MG tablet   Oral   Take 0.5 mg by mouth at bedtime.         . predniSONE (DELTASONE) 20 MG tablet   Oral   Take 3 tablets (60 mg total) by mouth once. Patient not taking: Reported on 12/22/2014   12 tablet   0   . pregabalin (LYRICA) 75 MG capsule   Oral   Take 75 mg by mouth 2 (two) times daily.         . Rivaroxaban (XARELTO) 15 MG TABS tablet   Oral   Take 15 mg by mouth daily with supper.         . simvastatin (ZOCOR) 40 MG tablet   Oral   Take 40 mg by mouth  daily.         . traZODone (DESYREL) 100 MG tablet   Oral   Take 100 mg by mouth at bedtime.           Allergies Review of patient's allergies indicates no known allergies.  No family history on file.  Social History History  Substance Use Topics  . Smoking status: Never Smoker   . Smokeless tobacco: Not on file  . Alcohol Use: Yes     Comment: occasionally    Review of Systems  Constitutional: Negative for fever. Eyes: Negative for visual changes. ENT: Negative for sore throat Cardiovascular: Positive for chest pain. Respiratory: Positive for shortness of breath. Gastrointestinal: Negative for abdominal pain, vomiting and diarrhea. Genitourinary: Negative for dysuria. Musculoskeletal: Negative for back pain. Skin: Negative for rash. Neurological: Negative for headaches Psychiatric: Mild anxiety    ____________________________________________   PHYSICAL EXAM:  VITAL SIGNS: ED Triage Vitals  Enc Vitals Group     BP 01/14/15 0744 164/66 mmHg     Pulse Rate 01/14/15 0744 67     Resp 01/14/15 0914 24     Temp 01/14/15 0744 97.8 F (36.6 C)     Temp Source 01/14/15 0744 Oral     SpO2 01/14/15 0744 95 %     Weight 01/14/15 0744 290 lb (131.543 kg)     Height 01/14/15 0744 5\' 9"  (1.753 m)     Head Cir --      Peak Flow --      Pain Score 01/14/15 0908 6     Pain Loc --      Pain Edu? --      Excl. in [place]? --      Constitutional: Alert and oriented. Obese. pleasant and interactive Eyes: Conjunctivae are normal.  ENT   Head: Normocephalic and atraumatic.   Mouth/Throat: Mucous membranes are  moist. Cardiovascular: Normal rate, regular rhythm. Normal and symmetric distal pulses are present in all extremities. No murmurs, rubs, or gallops. Respiratory: Mild tachypnea with some accessory muscle use. Breath sounds are clear and equal bilaterally. No wheezes Gastrointestinal: Soft and non-tender in all quadrants. No distention. There is no CVA  tenderness. Genitourinary: deferred Musculoskeletal: Nontender with normal range of motion in all extremities. 2+ edema bilaterally Neurologic:  Normal speech and language. No gross focal neurologic deficits are appreciated. Skin:  Skin is warm, dry and intact. No rash noted. Psychiatric: Mood and affect are normal. Patient exhibits appropriate insight and judgment.  ____________________________________________    LABS (pertinent positives/negatives)  Labs Reviewed  CBC  COMPREHENSIVE METABOLIC PANEL  TROPONIN I  BRAIN NATRIURETIC PEPTIDE    ____________________________________________   EKG  ED ECG REPORT I, Lavonia Drafts, the attending physician, personally viewed and interpreted this ECG.  Date: 01/14/2015 EKG Time: 7:52 AM Rate: 63 Rhythm: normal sinus rhythm QRS Axis: normal Intervals: normal ST/T Wave abnormalities: normal Conduction Disturbances: none Narrative Interpretation: unremarkable   ____________________________________________    RADIOLOGY I have personally reviewed any xrays that were ordered on this patient: Chest x-ray shows cardiomegaly with interstitial edema  ____________________________________________   PROCEDURES  Procedure(s) performed: none  Critical Care performed: none  ____________________________________________   INITIAL IMPRESSION / ASSESSMENT AND PLAN / ED COURSE  Pertinent labs & imaging results that were available during my care of the patient were reviewed by me and considered in my medical decision making (see chart for details).  Presentation is most concerning with CHF exacerbation. We will check labs, x-ray , perform ambulatory pulse ox and reevaluate.  ----------------------------------------- 11:35 AM on 01/14/2015 -----------------------------------------  Patient markedly tachypneic and short of breath on ambulation pulse ox decreased to 91% before nurse turned around to sit him down. We will give Lasix  40 mg IV and admit the patient for further diuresis and workup.  ____________________________________________   FINAL CLINICAL IMPRESSION(S) / ED DIAGNOSES  Final diagnoses:  Acute exacerbation of CHF (congestive heart failure)     Lavonia Drafts, MD 01/14/15 1205

## 2015-01-14 NOTE — ED Notes (Signed)
Pt states sob x 3 months. Pt seen by pcp and heart md last week. Was taken off of a heart med because it was thought it could be the problem. Pt states he is still breathing very hard.

## 2015-01-14 NOTE — ED Notes (Signed)
Ambulated patient down hall. Patient was extremely SOB and had to stop several times. SPO2 dropped to 91% while ambulating but did return to 97% after returning to bed.

## 2015-01-15 ENCOUNTER — Inpatient Hospital Stay
Admit: 2015-01-15 | Discharge: 2015-01-15 | Disposition: A | Discharge status: Home / Self Care | Payer: BLUE CROSS/BLUE SHIELD | Attending: Internal Medicine | Admitting: Internal Medicine

## 2015-01-15 ENCOUNTER — Inpatient Hospital Stay: Payer: BLUE CROSS/BLUE SHIELD

## 2015-01-15 DIAGNOSIS — I509 Heart failure, unspecified: Secondary | ICD-10-CM

## 2015-01-15 LAB — CBC
HEMATOCRIT: 32.5 % — AB (ref 40.0–52.0)
Hemoglobin: 10.6 g/dL — ABNORMAL LOW (ref 13.0–18.0)
MCH: 26.3 pg (ref 26.0–34.0)
MCHC: 32.5 g/dL (ref 32.0–36.0)
MCV: 80.7 fL (ref 80.0–100.0)
PLATELETS: 149 10*3/uL — AB (ref 150–440)
RBC: 4.03 MIL/uL — ABNORMAL LOW (ref 4.40–5.90)
RDW: 17.5 % — ABNORMAL HIGH (ref 11.5–14.5)
WBC: 6.2 10*3/uL (ref 3.8–10.6)

## 2015-01-15 LAB — BASIC METABOLIC PANEL
Anion gap: 9 (ref 5–15)
BUN: 18 mg/dL (ref 6–20)
CO2: 27 mmol/L (ref 22–32)
Calcium: 8.6 mg/dL — ABNORMAL LOW (ref 8.9–10.3)
Chloride: 105 mmol/L (ref 101–111)
Creatinine, Ser: 0.98 mg/dL (ref 0.61–1.24)
GFR calc Af Amer: >60 mL/min (ref >60)
GFR calc non Af Amer: >60 mL/min (ref >60)
Glucose, Bld: 155 mg/dL — ABNORMAL HIGH (ref 65–99)
POTASSIUM: 3.7 mmol/L (ref 3.5–5.1)
SODIUM: 141 mmol/L (ref 135–145)

## 2015-01-15 LAB — HEMOGLOBIN A1C: Hgb A1c MFr Bld: 8.6 % — ABNORMAL HIGH (ref 4.0–6.0)

## 2015-01-15 LAB — TROPONIN I

## 2015-01-15 LAB — GLUCOSE, CAPILLARY: Glucose-Capillary: 188 mg/dL — ABNORMAL HIGH (ref 65–99)

## 2015-01-15 MED ORDER — POTASSIUM CHLORIDE CRYS ER 20 MEQ PO TBCR
20.0000 meq | EXTENDED_RELEASE_TABLET | Freq: Two times a day (BID) | ORAL | Status: DC
  Filled 2015-01-15: qty 1

## 2015-01-15 NOTE — Discharge Instructions (Signed)
[facility] appointment on January 22, 2015 at 11:00am with Darylene Price, [place]. Please call [phone] to reschedule.

## 2015-01-15 NOTE — Progress Notes (Signed)
A&O. Independent. RA. No complaints. Slept well during the night. Ambien given for sleep.

## 2015-01-15 NOTE — Progress Notes (Signed)
[facility] Encounter Note  Patient: Marcus Williams / Admit Date: 01/14/2015 / Date of Encounter: 01/15/2015, 8:18 AM   Subjective: Breathing is much better today and no evidence of significant congestive heart failure  Review of Systems: Positive for: None Negative for: Vision change, hearing change, syncope, dizziness, nausea, vomiting,diarrhea, bloody stool, stomach pain, cough, congestion, diaphoresis, urinary frequency, urinary pain,skin lesions, skin rashes Others previously listed  Objective: Telemetry: Normal sinus rhythm Physical Exam: Blood pressure 134/62, pulse 60, temperature 97.5 F (36.4 C), temperature source Oral, resp. rate 22, height 5\' 9"  (1.753 m), weight 296 lb 8 oz (134.492 kg), SpO2 97 %. Body mass index is 43.77 kg/(m^2). General: Well developed, well nourished, in no acute distress. Head: Normocephalic, atraumatic, sclera non-icteric, no xanthomas, nares are without discharge. Neck: No apparent masses Lungs: Normal respirations with few wheezes, no rhonchi, no rales , no crackles   Heart: Regular rate and rhythm, normal S1 S2, no murmur, no rub, no gallop, PMI is normal size and placement, carotid upstroke normal without bruit, jugular venous pressure normal Abdomen: Soft, non-tender, non-distended with normoactive bowel sounds. No hepatosplenomegaly. Abdominal aorta is normal size without bruit Extremities: No edema, no clubbing, no cyanosis, no ulcers,  Peripheral: 2+ radial, 2+ femoral, 2+ dorsal pedal pulses Neuro: Alert and oriented. Moves all extremities spontaneously. Psych:  Responds to questions appropriately with a normal affect.   Intake/Output Summary (Last 24 hours) at 01/15/15 0818 Last data filed at 01/15/15 0730  Gross per 24 hour  Intake    240 ml  Output   3375 ml  Net  -3135 ml    Inpatient Medications:  . amiodarone  200 mg Oral Daily  . aspirin EC  325 mg Oral Daily  . budesonide-formoterol  2 puff Inhalation  BID  . diltiazem  300 mg Oral Daily  . furosemide  40 mg Oral BID  . insulin aspart  0-9 Units Subcutaneous TID AC & HS  . metFORMIN  1,000 mg Oral BID WC  . metoprolol tartrate  25 mg Oral BID  . montelukast  10 mg Oral QHS  . pantoprazole  40 mg Oral Daily  . potassium chloride  20 mEq Oral BID  . rivaroxaban  20 mg Oral Q supper  . simvastatin  40 mg Oral Daily  . sodium chloride  3 mL Intravenous Q12H   Infusions:    Labs:  Recent Labs  01/14/15 0925 01/15/15 0247  NA 140 141  K 4.1 3.7  CL 107 105  CO2 25 27  GLUCOSE 224* 155*  BUN 20 18  CREATININE 0.90 0.98  CALCIUM 8.6* 8.6*    Recent Labs  01/14/15 0925  AST 28  ALT 32  ALKPHOS 50  BILITOT 0.4  PROT 6.8  ALBUMIN 3.4*    Recent Labs  01/14/15 0925 01/15/15 0247  WBC 5.6 6.2  HGB 10.7* 10.6*  HCT 32.9* 32.5*  MCV 80.8 80.7  PLT 136* 149*    Recent Labs  01/14/15 0925 01/14/15 1443 01/14/15 2019 01/15/15 0247  TROPONINI <0.03 <0.03 <0.03 <0.03   Invalid input(s): POCBNP No results for input(s): HGBA1C in the last 72 hours.   Weights: Filed Weights   01/14/15 0744 01/15/15 0525  Weight: 290 lb (131.543 kg) 296 lb 8 oz (134.492 kg)     Radiology/Studies:  Dg Chest 2 View  01/14/2015   CLINICAL DATA:  Shortness of breath for 2 weeks.  EXAM: CHEST  2 VIEW  COMPARISON:  Sev[age]-two  thousand [age] and prior radiographs  FINDINGS: Cardiomegaly and CABG changes again noted.  Pulmonary vascular congestion is again identified with trace amount of right pleural fluid.  Mild interstitial opacities could represent mild interstitial edema.  There is no evidence of left pleural effusion, pneumothorax, or pulmonary mass.  No acute bony abnormalities are identified.  IMPRESSION: Cardiomegaly with pulmonary vascular congestion and possible mild interstitial edema.   Electronically Signed   By: Margarette Canada M.D.   On: 01/14/2015 08:22   Ct Head Wo Contrast  12/21/2014   CLINICAL DATA:  Shortness of  breath for 1 month getting worse, severe intermittent headaches lasting for 3 hours or more  EXAM: CT HEAD WITHOUT CONTRAST  TECHNIQUE: Contiguous axial images were obtained from the base of the skull through the vertex without intravenous contrast.  COMPARISON:  08/14/2014  FINDINGS: Bilateral age-related calcification of the basal ganglia. Mild diffuse age-related cortical atrophy. No evidence of mass or infarct. No hydrocephalus hemorrhage or extra-axial fluid. Calvarium intact. No significant inflammatory change in the visualized portions of the paranasal sinuses.  IMPRESSION: No acute intracranial abnormalities.   Electronically Signed   By: Skipper Cliche M.D.   On: 12/21/2014 13:05   US Venous Img Lower Unilateral Left  01/14/2015   CLINICAL DATA:  Left calf pain for 12 hours.  EXAM: LEFT LOWER EXTREMITY VENOUS DOPPLER ULTRASOUND  TECHNIQUE: Gray-scale sonography with graded compression, as well as color Doppler and duplex ultrasound were performed to evaluate the lower extremity deep venous systems from the level of the common femoral vein and including the common femoral, femoral, profunda femoral, popliteal and calf veins including the posterior tibial, peroneal and gastrocnemius veins when visible. The superficial great saphenous vein was also interrogated. Spectral Doppler was utilized to evaluate flow at rest and with distal augmentation maneuvers in the common femoral, femoral and popliteal veins.  COMPARISON:  04/21/2012  FINDINGS: Contralateral Common Femoral Vein: Respiratory phasicity is normal and symmetric with the symptomatic side. No evidence of thrombus. Normal compressibility.  Common Femoral Vein: No evidence of thrombus. Normal compressibility, respiratory phasicity and response to augmentation.  Saphenofemoral Junction: No evidence of thrombus. Normal compressibility and flow on color Doppler imaging.  Profunda Femoral Vein: No evidence of thrombus. Normal compressibility and flow on  color Doppler imaging.  Femoral Vein: No evidence of thrombus. Normal compressibility, respiratory phasicity and response to augmentation.  Popliteal Vein: No evidence of thrombus. Normal compressibility, respiratory phasicity and response to augmentation.  Calf Veins: No evidence of thrombus. Normal compressibility and flow on color Doppler imaging.  Superficial Great Saphenous Vein: No evidence of thrombus. Normal compressibility and flow on color Doppler imaging.  Venous Reflux:  None.  Other Findings:  None.  IMPRESSION: No evidence of deep venous thrombosis.   Electronically Signed   By: Rolm Baptise M.D.   On: 01/14/2015 16:19   Portable Chest 1 View  01/15/2015   CLINICAL DATA:  History of myocardial infarction and cardiac arrhythmias ; recent congestive heart failure.  EXAM: PORTABLE CHEST - 1 VIEW  COMPARISON:  January 14, 2015  FINDINGS: Currently there is no edema or consolidation. The heart is slightly enlarged with a mild degree of pulmonary venous hypertension. No adenopathy. Patient is status post coronary artery bypass grafting.  IMPRESSION: Mild cardiomegaly and pulmonary venous hypertension consistent with a degree of pulmonary vascular congestion. There is currently no edema or consolidation, however. No new opacity.   Electronically Signed   By: Lowella Grip III M.D.  On: 01/15/2015 07:29   Dg Chest Port 1 View  12/22/2014   CLINICAL DATA:  Shortness of breath for 1 day  EXAM: PORTABLE CHEST - 1 VIEW  COMPARISON:  Chest radiograph and chest CT December 10, 2014  FINDINGS: Slight scarring in each mid lung is stable. There is no edema or consolidation. Heart size and pulmonary vascularity are normal. No adenopathy. Patient is status post coronary artery bypass grafting.  IMPRESSION: Scattered areas of mild scarring.  No edema or consolidation.   Electronically Signed   By: Lowella Grip III M.D.   On: 12/22/2014 15:15     Assessment and Recommendation  59 y.o. male with known coronary  artery disease status post coronary bypass graft and old myocardial infarction with the obstructive sleep apnea nonvalvular atrial fibrillation paroxysmal in nature currently in normal sinus rhythm with essential hypertension having shortness of breath and no current evidence of myocardial infarction or EKG changes now resolved with less shortness of breath 1. Continue ambulation following for any significant symptoms 2. Amiodarone and metoprolol for heart rate control and maintenance of normal sinus rhythm 3. Diltiazem for heart rate control and hypertension control without change today 4. Anticoagulation for further risk reduction in stroke with atrial fibrillation without change 5. Continue diligent use of CPAP machine and sleep apnea 6. Lasix for lower extremity edema 7. No further cardiac diagnostics necessary at this time and okay for discharge to home from cardiac standpoint with follow-up next week  Signed, Serafina Royals M.D. FACC

## 2015-01-15 NOTE — Progress Notes (Signed)
Pt to be discharged toay. Iv and tele removed. disch instructions given to pt to his understanding. Follow up appt for chf clinic given. disch via w.c..

## 2015-01-15 NOTE — Care Management (Signed)
Patient has had recent discharge (12/22/2014) after stay for copd.  REadmitted under observation 7/31 for exac of chf.  FReferral made to heart Failure Clinic.  Discussed during progression of need to obtain exertion 02 sats to assure patient is not going to require home 02. He  diuresed  approx 3 liters

## 2015-01-15 NOTE — Discharge Summary (Signed)
Marcus Williams, is a 59 y.o. male  DOB 1955/08/08  MRN [id number].  Admission date:  01/14/2015  Admitting Physician  Lance Coon, MD  Discharge Date:  01/15/2015    Admission Diagnosis  Acute exacerbation of CHF (congestive heart failure) [I50.9]   Discharge Diagnoses    Acute on chronic systolic congestive heart failure Obstructive sleep apnea Diabetes mellitus, insulin-requiring History A. fib   Past Medical History  Diagnosis Date  . MI (myocardial infarction)   . Sepsis   . History of cardioversion   . Diabetes mellitus without complication   . Hypertension   . CAD (coronary artery disease)   . SVT (supraventricular tachycardia)   . OSA on CPAP   . A-fib   . Chronic systolic CHF (congestive heart failure)     Past Surgical History  Procedure Laterality Date  . Quadruple bypass    . Knee surgery         History of present illness and  Hospital Course:     Kindly see H&P for history of present illness and admission details, please review complete Labs, Consult reports and Test reports for all details in brief  HPI  from the history and physical done on the day of admission    Hospital Course    Patient was admitted and found to be hypervolemic, likely due to CHF versus cor pulmonale. He IV diuresis about 3 L. He felt comfortable going home and will go to 3 Lasix daily. Troponins negative   Discharge Condition: Stable   Follow UP  Dr. Doy Hutching one week    Discharge Instructions  and  Discharge Medications  Ventolin 2 puffs every 6 when necessary DuoNeb SVN 4 times a day Aspirin 325 mg daily Symbicort 2 puffs twice a day Diltiazem ER 300 mg daily Flonase 2 sprays daily Lasix 40 mg twice a day Amaryl 4 mg daily NovoLog insulin 40 units daily Ativan 1 mg every 4 when necessary Metformin 1000 g twice a day Metoprolol tartrate 25 mg twice a day Singulair 10 mg at  bedtime Nitro-Dur patch 0.6 mg daily Protonix 40 g daily Mirapex 0.5 g at bedtime Lyrica 75 mg twice a day Xarelto 15 mg at bedtime Simvastatin 40 g daily Trazodone 100 mg at bedtime    Today   Subjective:   Marcus Williams today is stable and wants to go home  Objective:   Blood pressure 134/62, pulse 60, temperature 97.5 F (36.4 C), temperature source Oral, resp. rate 22, height 5\' 9"  (1.753 m), weight 134.492 kg (296 lb 8 oz), SpO2 97 %.   Exam Awake Alert, Oriented x 3, No new F.N deficits, Normal affect [place].AT,PERRAL Supple Neck,No JVD, No cervical lymphadenopathy appriciated.  Symmetrical Chest wall movement, Good air movement bilaterally, CTAB RRR,No Gallops,Rubs or new Murmurs, Abd Soft, Non tender, No rebound. No Cyanosis, Clubbing 2 +edema, No new Rash or bruise  Total Time in preparing paper work, data evaluation and todays exam - 35 minutes  [place] F. M.D on 01/15/2015 at  7:11 AM

## 2015-01-22 ENCOUNTER — Telehealth: Payer: Self-pay | Admitting: Family

## 2015-01-22 ENCOUNTER — Ambulatory Visit: Payer: BLUE CROSS/BLUE SHIELD | Admitting: Family

## 2015-01-22 NOTE — Telephone Encounter (Signed)
Patient was a no show for his initial appointment at the [facility] on 01/22/15. Will mail a letter to his home to reschedule.

## 2015-01-29 ENCOUNTER — Ambulatory Visit: Payer: Commercial Managed Care - HMO | Attending: Family | Admitting: Family

## 2015-01-29 ENCOUNTER — Encounter: Payer: Self-pay | Admitting: Family

## 2015-01-29 VITALS — BP 140/53 | HR 62 | Resp 20 | Ht 69.0 in | Wt 300.0 lb

## 2015-01-29 DIAGNOSIS — I2510 Atherosclerotic heart disease of native coronary artery without angina pectoris: Secondary | ICD-10-CM | POA: Diagnosis not present

## 2015-01-29 DIAGNOSIS — I471 Supraventricular tachycardia: Secondary | ICD-10-CM | POA: Diagnosis not present

## 2015-01-29 DIAGNOSIS — E119 Type 2 diabetes mellitus without complications: Secondary | ICD-10-CM | POA: Diagnosis not present

## 2015-01-29 DIAGNOSIS — I10 Essential (primary) hypertension: Secondary | ICD-10-CM | POA: Diagnosis not present

## 2015-01-29 DIAGNOSIS — G4733 Obstructive sleep apnea (adult) (pediatric): Secondary | ICD-10-CM | POA: Insufficient documentation

## 2015-01-29 DIAGNOSIS — J449 Chronic obstructive pulmonary disease, unspecified: Secondary | ICD-10-CM | POA: Insufficient documentation

## 2015-01-29 DIAGNOSIS — I252 Old myocardial infarction: Secondary | ICD-10-CM | POA: Diagnosis not present

## 2015-01-29 DIAGNOSIS — I480 Paroxysmal atrial fibrillation: Secondary | ICD-10-CM | POA: Diagnosis not present

## 2015-01-29 DIAGNOSIS — Z79899 Other long term (current) drug therapy: Secondary | ICD-10-CM | POA: Diagnosis not present

## 2015-01-29 DIAGNOSIS — I4891 Unspecified atrial fibrillation: Secondary | ICD-10-CM | POA: Insufficient documentation

## 2015-01-29 DIAGNOSIS — I25810 Atherosclerosis of coronary artery bypass graft(s) without angina pectoris: Secondary | ICD-10-CM | POA: Diagnosis not present

## 2015-01-29 DIAGNOSIS — I5043 Acute on chronic combined systolic (congestive) and diastolic (congestive) heart failure: Secondary | ICD-10-CM | POA: Diagnosis not present

## 2015-01-29 DIAGNOSIS — I5032 Chronic diastolic (congestive) heart failure: Secondary | ICD-10-CM | POA: Diagnosis not present

## 2015-01-29 DIAGNOSIS — E1143 Type 2 diabetes mellitus with diabetic autonomic (poly)neuropathy: Secondary | ICD-10-CM | POA: Diagnosis not present

## 2015-01-29 DIAGNOSIS — J410 Simple chronic bronchitis: Secondary | ICD-10-CM

## 2015-01-29 DIAGNOSIS — I482 Chronic atrial fibrillation, unspecified: Secondary | ICD-10-CM

## 2015-01-29 NOTE — Progress Notes (Signed)
Subjective:    Patient ID: Marcus Williams, male    DOB: 07-12-1955, 59 y.o.   MRN: [id number]  Congestive Heart Failure Presents for initial visit. The disease course has been stable. Associated symptoms include edema, fatigue and shortness of breath (mild). Pertinent negatives include no abdominal pain, chest pain, chest pressure, orthopnea or palpitations. The symptoms have been stable. Past treatments include beta blockers and salt and fluid restriction. The treatment provided moderate relief. Compliance with prior treatments has been good. His past medical history is significant for arrhythmia, CAD, chronic lung disease, DM and HTN. There is no history of DVT. Compliance with total regimen is 76-100%.  Other This is a chronic (edema, fatigue) problem. The current episode started more than 1 month ago. The problem occurs constantly. The problem has been unchanged. Associated symptoms include coughing (more in the evening), fatigue and neck pain. Pertinent negatives include no abdominal pain, chest pain, congestion, fever, headaches, nausea, numbness, sore throat or weakness. The symptoms are aggravated by standing and walking. He has tried position changes for the symptoms. The treatment provided mild relief.   Past Medical History  Diagnosis Date  . MI (myocardial infarction)   . Sepsis   . History of cardioversion   . Diabetes mellitus without complication   . Hypertension   . CAD (coronary artery disease)   . SVT (supraventricular tachycardia)   . OSA on CPAP   . A-fib   . Chronic systolic CHF (congestive heart failure)     Past Surgical History  Procedure Laterality Date  . Quadruple bypass    . Knee surgery      Family History  Problem Relation Age of Onset  . Stroke Mother   . Heart attack Mother   . Hypertension Mother   . Heart attack Father   . Hypertension Father   . Heart attack Brother     #1  . Diabetes Brother     #1  . Heart disease Brother     #2  . Lung  disease Brother     #2  . Hypertension Brother     #2  . Diabetes Brother     #2    Social History  Substance Use Topics  . Smoking status: Never Smoker   . Smokeless tobacco: Not on file  . Alcohol Use: Yes     Comment: occasionally    No Known Allergies  Prior to Admission medications   Medication Sig Start Date End Date Taking? Authorizing Provider  albuterol (PROVENTIL HFA;VENTOLIN HFA) 108 (90 BASE) MCG/ACT inhaler Inhale 2 puffs into the lungs every 6 (six) hours as needed for wheezing or shortness of breath. 12/10/14  Yes Lisa Roca, MD  aspirin EC 325 MG tablet Take 325 mg by mouth daily.   Yes Historical Provider, MD  diltiazem (CARDIZEM CD) 300 MG 24 hr capsule Take 1 capsule (300 mg total) by mouth daily. 10/15/14  Yes Idelle Crouch, MD  fluticasone (FLONASE) 50 MCG/ACT nasal spray Place 1 spray into both nostrils daily.   Yes Historical Provider, MD  furosemide (LASIX) 40 MG tablet Take 1 tablet (40 mg total) by mouth 2 (two) times daily. 10/15/14  Yes Idelle Crouch, MD  glimepiride (AMARYL) 4 MG tablet Take 4 mg by mouth daily.   Yes Historical Provider, MD  insulin aspart (NOVOLOG) 100 UNIT/ML injection Inject 40 Units into the skin daily.    Yes Historical Provider, MD  metFORMIN (GLUCOPHAGE) 1000 MG tablet Take 1,000  mg by mouth 2 (two) times daily with a meal.   Yes Historical Provider, MD  metoprolol tartrate (LOPRESSOR) 25 MG tablet Take 1 tablet (25 mg total) by mouth 2 (two) times daily. 10/15/14  Yes Idelle Crouch, MD  nitroGLYCERIN (NITROSTAT) 0.4 MG SL tablet Place 0.4 mg under the tongue every 5 (five) minutes as needed for chest pain.   Yes Historical Provider, MD  pantoprazole (PROTONIX) 40 MG tablet Take 40 mg by mouth daily.   Yes Historical Provider, MD  pramipexole (MIRAPEX) 0.5 MG tablet Take 0.5 mg by mouth at bedtime.   Yes Historical Provider, MD  pregabalin (LYRICA) 75 MG capsule Take 75 mg by mouth 2 (two) times daily.   Yes Historical  Provider, MD  simvastatin (ZOCOR) 40 MG tablet Take 40 mg by mouth daily.   Yes Historical Provider, MD  traZODone (DESYREL) 100 MG tablet Take 100 mg by mouth at bedtime.   Yes Historical Provider, MD     Review of Systems  Constitutional: Positive for appetite change (decreased) and fatigue. Negative for fever.  HENT: Negative for congestion, postnasal drip and sore throat.   Eyes: Negative.   Respiratory: Positive for cough (more in the evening), shortness of breath (mild) and wheezing. Negative for chest tightness.   Cardiovascular: Positive for leg swelling. Negative for chest pain and palpitations.  Gastrointestinal: Negative for nausea, abdominal pain and abdominal distention.  Endocrine: Negative.   Genitourinary: Negative.   Musculoskeletal: Positive for back pain (when doing a lot of walking) and neck pain.  Skin: Negative.   Allergic/Immunologic: Negative.   Neurological: Negative for dizziness, weakness, light-headedness, numbness and headaches.  Hematological: Negative for adenopathy. Does not bruise/bleed easily.  Psychiatric/Behavioral: Negative for sleep disturbance (sleeping on 2 pillows with CPAP) and dysphoric mood. The patient is not nervous/anxious.        Objective:   Physical Exam  Constitutional: He is oriented to person, place, and time. He appears well-developed and well-nourished.  HENT:  Head: Normocephalic and atraumatic.  Eyes: Conjunctivae are normal. Pupils are equal, round, and reactive to light.  Neck: Normal range of motion. Neck supple.  Cardiovascular: Normal rate and regular rhythm.   Pulmonary/Chest: Effort normal. He has no wheezes. He has no rales.  Abdominal: Soft. He exhibits no distension. There is no tenderness.  Musculoskeletal: He exhibits edema (2+ pitting edema in bilateral legs with L>R) and tenderness (to palpation on the left shin).  Neurological: He is alert and oriented to person, place, and time.  Skin: Skin is warm and dry.   Psychiatric: He has a normal mood and affect. His behavior is normal. Thought content normal.  Nursing note and vitals reviewed.  BP 140/53 mmHg  Pulse 62  Resp 20  Ht 5\' 9"  (1.753 m)  Wt 300 lb (136.079 kg)  BMI 44.28 kg/m2  SpO2 96%        Assessment & Plan:  1: Chronic heart failure with preserved ejection fraction- Patient presents with shortness of breath and fatigue with exertion. He says that he was symptomatic upon walking into the building but once he sat down to rest, he recovered quickly. He does wear his CPAP nightly and says that he's sleeping well. He does have chronic swelling in both lower legs along with tenderness in the left calf. There is more swelling in the left than in the right and he says that he's ruled out for a DVT. He has seen vascular in the past. Encouraged him to elevate  his legs when he's home sitting down. He has scales at home but hasn't been weighing himself daily. Discussed the importance of weighing himself on a daily basis and write down the weight on his chart to bring back for review. He's to call for an overnight weight gain of >2 pounds or a weekly weight gain of >5 pounds. He does not add salt to his food and does read food labels. Discussed the importance of following a 2000mg  sodium diet and written information was given to him.  2: Atrial fibrillation- Heart rate is currently regular. He is taking lopressor, cardizem and aspirin. He thought he was still taking the "blood thinner" but it's not one of the bottles he brought and he said that he brought everything. He sees his PCP today so he was encouraged to ask him if he should be back on it. Wrote the name xarelto as well as nitrodur patch down for him to ask as both of these were written on his discharge summary but he's currently not on either one.  3: COPD- Patient is short of breath but seems to recover quickly. Symbicort and singular were also written on his discharge papers but he's currently  not taking either so, again, wrote these names down for him to ask his PCP about. Does not wear oxygen. 4: Diabetes- He says that his glucose this morning was 170. He says it was up in the 500's when he was taking prednisone.  Return in 1 month or sooner for any questions/problems before then.

## 2015-01-29 NOTE — Patient Instructions (Addendum)
Continue weighing daily and call for an overnight weight gain of > 2 pounds or a weekly weight gain of >5 pounds.  Elevate legs as much as possible at home.

## 2015-02-15 DIAGNOSIS — E785 Hyperlipidemia, unspecified: Secondary | ICD-10-CM | POA: Diagnosis not present

## 2015-02-15 DIAGNOSIS — E669 Obesity, unspecified: Secondary | ICD-10-CM | POA: Diagnosis not present

## 2015-02-15 DIAGNOSIS — I10 Essential (primary) hypertension: Secondary | ICD-10-CM | POA: Diagnosis not present

## 2015-02-15 DIAGNOSIS — I8310 Varicose veins of unspecified lower extremity with inflammation: Secondary | ICD-10-CM | POA: Diagnosis not present

## 2015-02-15 DIAGNOSIS — I872 Venous insufficiency (chronic) (peripheral): Secondary | ICD-10-CM | POA: Diagnosis not present

## 2015-02-15 DIAGNOSIS — M7989 Other specified soft tissue disorders: Secondary | ICD-10-CM | POA: Diagnosis not present

## 2015-02-15 DIAGNOSIS — I890 Lymphedema, not elsewhere classified: Secondary | ICD-10-CM | POA: Diagnosis not present

## 2015-02-15 DIAGNOSIS — M79609 Pain in unspecified limb: Secondary | ICD-10-CM | POA: Diagnosis not present

## 2015-02-21 DIAGNOSIS — I8310 Varicose veins of unspecified lower extremity with inflammation: Secondary | ICD-10-CM | POA: Diagnosis not present

## 2015-02-21 DIAGNOSIS — M7989 Other specified soft tissue disorders: Secondary | ICD-10-CM | POA: Diagnosis not present

## 2015-02-21 DIAGNOSIS — E785 Hyperlipidemia, unspecified: Secondary | ICD-10-CM | POA: Diagnosis not present

## 2015-02-21 DIAGNOSIS — I890 Lymphedema, not elsewhere classified: Secondary | ICD-10-CM | POA: Diagnosis not present

## 2015-02-21 DIAGNOSIS — M79609 Pain in unspecified limb: Secondary | ICD-10-CM | POA: Diagnosis not present

## 2015-02-21 DIAGNOSIS — I872 Venous insufficiency (chronic) (peripheral): Secondary | ICD-10-CM | POA: Diagnosis not present

## 2015-02-21 DIAGNOSIS — I10 Essential (primary) hypertension: Secondary | ICD-10-CM | POA: Diagnosis not present

## 2015-02-21 DIAGNOSIS — E669 Obesity, unspecified: Secondary | ICD-10-CM | POA: Diagnosis not present

## 2015-03-01 ENCOUNTER — Ambulatory Visit: Payer: Commercial Managed Care - HMO | Admitting: Family

## 2015-03-01 ENCOUNTER — Telehealth: Payer: Self-pay | Admitting: Family

## 2015-03-01 NOTE — Telephone Encounter (Signed)
Patient did not show up for his appointment at the [facility] on 03/01/15. Will attempt to reschedule.

## 2015-03-14 DIAGNOSIS — I10 Essential (primary) hypertension: Secondary | ICD-10-CM | POA: Diagnosis not present

## 2015-03-14 DIAGNOSIS — Z79899 Other long term (current) drug therapy: Secondary | ICD-10-CM | POA: Diagnosis not present

## 2015-03-14 DIAGNOSIS — I480 Paroxysmal atrial fibrillation: Secondary | ICD-10-CM | POA: Diagnosis not present

## 2015-03-14 DIAGNOSIS — Z951 Presence of aortocoronary bypass graft: Secondary | ICD-10-CM | POA: Diagnosis not present

## 2015-03-14 DIAGNOSIS — R0602 Shortness of breath: Secondary | ICD-10-CM | POA: Diagnosis not present

## 2015-03-14 DIAGNOSIS — E1143 Type 2 diabetes mellitus with diabetic autonomic (poly)neuropathy: Secondary | ICD-10-CM | POA: Diagnosis not present

## 2015-03-15 DIAGNOSIS — I10 Essential (primary) hypertension: Secondary | ICD-10-CM | POA: Diagnosis not present

## 2015-03-15 DIAGNOSIS — E780 Pure hypercholesterolemia: Secondary | ICD-10-CM | POA: Diagnosis not present

## 2015-03-15 DIAGNOSIS — I25110 Atherosclerotic heart disease of native coronary artery with unstable angina pectoris: Secondary | ICD-10-CM | POA: Diagnosis not present

## 2015-03-15 DIAGNOSIS — I25810 Atherosclerosis of coronary artery bypass graft(s) without angina pectoris: Secondary | ICD-10-CM | POA: Diagnosis not present

## 2015-03-15 DIAGNOSIS — Z87898 Personal history of other specified conditions: Secondary | ICD-10-CM | POA: Diagnosis not present

## 2015-03-15 DIAGNOSIS — E1143 Type 2 diabetes mellitus with diabetic autonomic (poly)neuropathy: Secondary | ICD-10-CM | POA: Diagnosis not present

## 2015-03-15 DIAGNOSIS — I480 Paroxysmal atrial fibrillation: Secondary | ICD-10-CM | POA: Diagnosis not present

## 2015-03-26 ENCOUNTER — Emergency Department: Payer: Commercial Managed Care - HMO

## 2015-03-26 ENCOUNTER — Encounter: Payer: Self-pay | Admitting: Emergency Medicine

## 2015-03-26 ENCOUNTER — Inpatient Hospital Stay
Admission: EM | Admit: 2015-03-26 | Discharge: 2015-03-26 | DRG: 292 | Disposition: A | Discharge status: Home / Self Care | Payer: Commercial Managed Care - HMO | Attending: Internal Medicine | Admitting: Internal Medicine

## 2015-03-26 DIAGNOSIS — J9601 Acute respiratory failure with hypoxia: Secondary | ICD-10-CM | POA: Diagnosis present

## 2015-03-26 DIAGNOSIS — Z7984 Long term (current) use of oral hypoglycemic drugs: Secondary | ICD-10-CM

## 2015-03-26 DIAGNOSIS — Z794 Long term (current) use of insulin: Secondary | ICD-10-CM | POA: Diagnosis not present

## 2015-03-26 DIAGNOSIS — I252 Old myocardial infarction: Secondary | ICD-10-CM | POA: Diagnosis not present

## 2015-03-26 DIAGNOSIS — I509 Heart failure, unspecified: Secondary | ICD-10-CM | POA: Diagnosis present

## 2015-03-26 DIAGNOSIS — Z7982 Long term (current) use of aspirin: Secondary | ICD-10-CM | POA: Diagnosis not present

## 2015-03-26 DIAGNOSIS — I4891 Unspecified atrial fibrillation: Secondary | ICD-10-CM | POA: Diagnosis present

## 2015-03-26 DIAGNOSIS — I2510 Atherosclerotic heart disease of native coronary artery without angina pectoris: Secondary | ICD-10-CM | POA: Diagnosis present

## 2015-03-26 DIAGNOSIS — I110 Hypertensive heart disease with heart failure: Principal | ICD-10-CM | POA: Diagnosis present

## 2015-03-26 DIAGNOSIS — R0602 Shortness of breath: Secondary | ICD-10-CM

## 2015-03-26 DIAGNOSIS — E119 Type 2 diabetes mellitus without complications: Secondary | ICD-10-CM | POA: Diagnosis present

## 2015-03-26 DIAGNOSIS — J441 Chronic obstructive pulmonary disease with (acute) exacerbation: Secondary | ICD-10-CM | POA: Diagnosis present

## 2015-03-26 DIAGNOSIS — R05 Cough: Secondary | ICD-10-CM | POA: Diagnosis not present

## 2015-03-26 DIAGNOSIS — R0902 Hypoxemia: Secondary | ICD-10-CM | POA: Diagnosis not present

## 2015-03-26 DIAGNOSIS — Z79899 Other long term (current) drug therapy: Secondary | ICD-10-CM

## 2015-03-26 LAB — CBC WITH DIFFERENTIAL/PLATELET
BASOS ABS: 0.1 10*3/uL (ref 0–0.1)
Basophils Relative: 1 %
Eosinophils Absolute: 0.3 10*3/uL (ref 0–0.7)
Eosinophils Relative: 5 %
HEMATOCRIT: 33.9 % — AB (ref 40.0–52.0)
Hemoglobin: 11 g/dL — ABNORMAL LOW (ref 13.0–18.0)
LYMPHS ABS: 1.1 10*3/uL (ref 1.0–3.6)
Lymphocytes Relative: 17 %
MCH: 24.7 pg — ABNORMAL LOW (ref 26.0–34.0)
MCHC: 32.4 g/dL (ref 32.0–36.0)
MCV: 76.3 fL — ABNORMAL LOW (ref 80.0–100.0)
MONOS PCT: 8 %
Monocytes Absolute: 0.5 10*3/uL (ref 0.2–1.0)
NEUTROS ABS: 4.7 10*3/uL (ref 1.4–6.5)
Neutrophils Relative %: 69 %
Platelets: 147 10*3/uL — ABNORMAL LOW (ref 150–440)
RBC: 4.45 MIL/uL (ref 4.40–5.90)
RDW: 16.2 % — ABNORMAL HIGH (ref 11.5–14.5)
WBC: 6.8 10*3/uL (ref 3.8–10.6)

## 2015-03-26 LAB — COMPREHENSIVE METABOLIC PANEL
ALT: 35 U/L (ref 17–63)
AST: 30 U/L (ref 15–41)
Albumin: 3.8 g/dL (ref 3.5–5.0)
Alkaline Phosphatase: 54 U/L (ref 38–126)
Anion gap: 9 (ref 5–15)
BUN: 17 mg/dL (ref 6–20)
CO2: 27 mmol/L (ref 22–32)
CREATININE: 0.98 mg/dL (ref 0.61–1.24)
Calcium: 9.6 mg/dL (ref 8.9–10.3)
Chloride: 106 mmol/L (ref 101–111)
Glucose, Bld: 202 mg/dL — ABNORMAL HIGH (ref 65–99)
POTASSIUM: 3.5 mmol/L (ref 3.5–5.1)
Sodium: 142 mmol/L (ref 135–145)
TOTAL PROTEIN: 7.2 g/dL (ref 6.5–8.1)

## 2015-03-26 LAB — TROPONIN I: Troponin I: <0.03 ng/mL (ref <0.031)

## 2015-03-26 LAB — BRAIN NATRIURETIC PEPTIDE: B NATRIURETIC PEPTIDE 5: 199 pg/mL — AB (ref 0.0–100.0)

## 2015-03-26 MED ORDER — ONDANSETRON HCL 4 MG/2ML IJ SOLN
INTRAMUSCULAR | Status: AC
  Administered 2015-03-26: 4 mg via INTRAVENOUS
  Filled 2015-03-26: qty 2

## 2015-03-26 MED ORDER — ONDANSETRON HCL 4 MG/2ML IJ SOLN
4.0000 mg | Freq: Once | INTRAMUSCULAR | Status: AC
  Administered 2015-03-26: 4 mg via INTRAVENOUS

## 2015-03-26 MED ORDER — METHYLPREDNISOLONE SODIUM SUCC 125 MG IJ SOLR
125.0000 mg | Freq: Once | INTRAMUSCULAR | Status: AC
  Administered 2015-03-26: 125 mg via INTRAVENOUS
  Filled 2015-03-26: qty 2

## 2015-03-26 MED ORDER — MORPHINE SULFATE (PF) 4 MG/ML IV SOLN
4.0000 mg | Freq: Once | INTRAVENOUS | Status: AC
  Administered 2015-03-26: 4 mg via INTRAVENOUS

## 2015-03-26 MED ORDER — MORPHINE SULFATE (PF) 4 MG/ML IV SOLN
INTRAVENOUS | Status: AC
  Administered 2015-03-26: 4 mg via INTRAVENOUS
  Filled 2015-03-26: qty 1

## 2015-03-26 MED ORDER — FUROSEMIDE 10 MG/ML IJ SOLN
40.0000 mg | Freq: Once | INTRAMUSCULAR | Status: AC
  Administered 2015-03-26: 40 mg via INTRAVENOUS
  Filled 2015-03-26: qty 4

## 2015-03-26 MED ORDER — ASPIRIN 81 MG PO CHEW
324.0000 mg | CHEWABLE_TABLET | Freq: Once | ORAL | Status: AC
  Administered 2015-03-26: 324 mg via ORAL
  Filled 2015-03-26: qty 4

## 2015-03-26 MED ORDER — METHYLPREDNISOLONE 4 MG PO TBPK: ORAL_TABLET | ORAL | Status: DC

## 2015-03-26 MED ORDER — IPRATROPIUM-ALBUTEROL 0.5-2.5 (3) MG/3ML IN SOLN
3.0000 mL | Freq: Once | RESPIRATORY_TRACT | Status: AC
  Administered 2015-03-26: 3 mL via RESPIRATORY_TRACT
  Filled 2015-03-26: qty 3

## 2015-03-26 NOTE — ED Notes (Signed)
Pt c/o cough and wheezing for 2-3 days with shortness of breath; worse since 1am today; dry cough; pain below left rib area; area tender to touch; pt with audible wheezes in triage;

## 2015-03-26 NOTE — ED Notes (Signed)
Pt was ambulated down hallway and around ED on roomair, with lowest O2 sats being 86%.  However, patient was at 90-93% during majority of ambulation.  Dr. Beather Arbour notified at this time of ambulation and the above O2 saturations.

## 2015-03-26 NOTE — ED Provider Notes (Addendum)
[facility] Emergency Department Provider Note  ____________________________________________  Time seen: Approximately 5:53 AM  I have reviewed the triage vital signs and the nursing notes.   HISTORY  Chief Complaint Cough; Wheezing; Shortness of Breath; and Chest Pain    HPI PAYNE Marcus Williams is a 59 y.o. male who presents to the ED from home with a chief complaint of chest pain and shortness of breath. Patient complains of cough and wheezing for 2-3 days with progressive shortness of breath as well as bilateral lower extremity swelling. Complains of pain below left rib especially upon coughing. Patient with audible wheezes in triage. Complains of increased dyspnea on exertion this week. Denies recent fever, chills, abdominal pain, nausea, vomiting, diarrhea.Denies recent travel or trauma. Saw his cardiologist last week who told him he could cut down from 3-2 tablets of furosemide daily.   Past Medical History  Diagnosis Date  . MI (myocardial infarction) ([place])   . Sepsis ([place])   . History of cardioversion   . Diabetes mellitus without complication ([place])   . Hypertension   . CAD (coronary artery disease)   . SVT (supraventricular tachycardia) ([place])   . OSA on CPAP   . A-fib ([place])   . Chronic systolic CHF (congestive heart failure) [facility])     Patient Active Problem List   Diagnosis Date Noted  . Acute respiratory failure with hypoxia ([place]) 03/26/2015  . CHF (congestive heart failure) ([place]) 01/15/2015  . HTN (hypertension) 01/14/2015  . Type II diabetes mellitus ([place]) 01/14/2015  . COPD (chronic obstructive pulmonary disease) ([place]) 01/14/2015  . OSA on CPAP 01/14/2015  . CAD (coronary artery disease) 01/14/2015  . A-fib ([place]) 01/14/2015  . Pain of left calf 01/14/2015  . Acute on chronic systolic CHF (congestive heart failure) ([place]) 01/14/2015  . COPD exacerbation ([place]) 12/22/2014  . Diabetes ([place]) 10/14/2014    Past Surgical History  Procedure  Laterality Date  . Quadruple bypass    . Knee surgery      Current Outpatient Rx  Name  Route  Sig  Dispense  Refill  . albuterol (PROVENTIL HFA;VENTOLIN HFA) 108 (90 BASE) MCG/ACT inhaler   Inhalation   Inhale 2 puffs into the lungs every 6 (six) hours as needed for wheezing or shortness of breath.   1 Inhaler   0   . aspirin EC 325 MG tablet   Oral   Take 325 mg by mouth daily.         Marland Kitchen diltiazem (CARDIZEM CD) 300 MG 24 hr capsule   Oral   Take 1 capsule (300 mg total) by mouth daily.   30 capsule   5   . fluticasone (FLONASE) 50 MCG/ACT nasal spray   Each Nare   Place 1 spray into both nostrils daily.         . Fluticasone-Salmeterol (ADVAIR DISKUS) 250-50 MCG/DOSE AEPB   Inhalation   Inhale 1 puff into the lungs 2 (two) times daily.         . furosemide (LASIX) 40 MG tablet   Oral   Take 1 tablet (40 mg total) by mouth 2 (two) times daily.   60 tablet   5   . glimepiride (AMARYL) 4 MG tablet   Oral   Take 4 mg by mouth daily.         . insulin aspart (NOVOLOG) 100 UNIT/ML injection   Subcutaneous   Inject 40 Units into the skin daily.          Marland Kitchen  metFORMIN (GLUCOPHAGE) 1000 MG tablet   Oral   Take 1,000 mg by mouth 2 (two) times daily with a meal.         . metoprolol tartrate (LOPRESSOR) 25 MG tablet   Oral   Take 1 tablet (25 mg total) by mouth 2 (two) times daily.   60 tablet   5   . nitroGLYCERIN (NITROSTAT) 0.4 MG SL tablet   Sublingual   Place 0.4 mg under the tongue every 5 (five) minutes as needed for chest pain.         . pantoprazole (PROTONIX) 40 MG tablet   Oral   Take 40 mg by mouth daily.         . pramipexole (MIRAPEX) 0.5 MG tablet   Oral   Take 0.5 mg by mouth at bedtime.         . pregabalin (LYRICA) 75 MG capsule   Oral   Take 75 mg by mouth 2 (two) times daily.         . simvastatin (ZOCOR) 40 MG tablet   Oral   Take 40 mg by mouth daily.         . traZODone (DESYREL) 100 MG tablet   Oral   Take  100 mg by mouth at bedtime.         . methylPREDNISolone (MEDROL DOSEPAK) 4 MG TBPK tablet      Take as directed   21 tablet   0   . XARELTO 15 MG TABS tablet   Oral   Take 1 tablet by mouth daily.           Dispense as written.     Allergies Review of patient's allergies indicates no known allergies.  Family History  Problem Relation Age of Onset  . Stroke Mother   . Heart attack Mother   . Hypertension Mother   . Heart attack Father   . Hypertension Father   . Heart attack Brother     #1  . Diabetes Brother     #1  . Heart disease Brother     #2  . Lung disease Brother     #2  . Hypertension Brother     #2  . Diabetes Brother     #2    Social History Social History  Substance Use Topics  . Smoking status: Never Smoker   . Smokeless tobacco: None  . Alcohol Use: No    Review of Systems Constitutional: No fever/chills Eyes: No visual changes. ENT: No sore throat. Cardiovascular: Positive for chest pain. Respiratory: Positive for cough, wheezing and shortness of breath. Gastrointestinal: No abdominal pain.  No nausea, no vomiting.  No diarrhea.  No constipation. Genitourinary: Negative for dysuria. Musculoskeletal: Negative for back pain. Skin: Negative for rash. Neurological: Negative for headaches, focal weakness or numbness.  10-point ROS otherwise negative.  ____________________________________________   PHYSICAL EXAM:  VITAL SIGNS: ED Triage Vitals  Enc Vitals Group     BP 03/26/15 0523 181/62 mmHg     Pulse Rate 03/26/15 0523 74     Resp 03/26/15 0523 28     Temp 03/26/15 0523 97.8 F (36.6 C)     Temp Source 03/26/15 0523 Oral     SpO2 03/26/15 0523 93 %     Weight 03/26/15 0523 295 lb (133.811 kg)     Height 03/26/15 0523 5\' 9"  (1.753 m)     Head Cir --      Peak Flow --  Pain Score 03/26/15 0525 7     Pain Loc --      Pain Edu? --      Excl. in [place]? --     Constitutional: Alert and oriented. Well appearing and in  mild acute distress. Eyes: Conjunctivae are normal. PERRL. EOMI. Head: Atraumatic. Nose: No congestion/rhinnorhea. Mouth/Throat: Mucous membranes are moist.  Oropharynx non-erythematous. Neck: No stridor.   Cardiovascular: Normal rate, regular rhythm. Grossly normal heart sounds.  Good peripheral circulation. Respiratory: Increased respiratory effort.  No retractions. Lungs diminished with wheezing and rales bilaterally. Gastrointestinal: Soft and nontender. No distention. No abdominal bruits. No CVA tenderness. Musculoskeletal: No lower extremity tenderness. 2+ BLE nonpitting edema.  No joint effusions. Neurologic:  Normal speech and language. No gross focal neurologic deficits are appreciated.  Skin:  Skin is warm, dry and intact. No rash noted. Psychiatric: Mood and affect are normal. Speech and behavior are normal.  ____________________________________________   LABS (all labs ordered are listed, but only abnormal results are displayed)  Labs Reviewed  BRAIN NATRIURETIC PEPTIDE - Abnormal; Notable for the following:    B Natriuretic Peptide 199.0 (*)    All other components within normal limits  CBC WITH DIFFERENTIAL/PLATELET - Abnormal; Notable for the following:    Hemoglobin 11.0 (*)    HCT 33.9 (*)    MCV 76.3 (*)    MCH 24.7 (*)    RDW 16.2 (*)    Platelets 147 (*)    All other components within normal limits  COMPREHENSIVE METABOLIC PANEL - Abnormal; Notable for the following:    Glucose, Bld 202 (*)    Total Bilirubin <0.1 (*)    All other components within normal limits  TROPONIN I  TROPONIN I  TROPONIN I  TROPONIN I   ____________________________________________  EKG  ED ECG REPORT I, SUNG,JADE J, the attending physician, personally viewed and interpreted this ECG.   Date: 03/26/2015  EKG Time: 0542  Rate: 67  Rhythm: normal EKG, normal sinus rhythm  Axis: Normal  Intervals:none  ST&T Change:  Nonspecific  ____________________________________________  RADIOLOGY  Chest 2 view (viewed by me, interpreted per Dr. Gerilyn Nestle): Linear atelectasis in the mid lungs. ____________________________________________   PROCEDURES  Procedure(s) performed: None  Critical Care performed: No  ____________________________________________   INITIAL IMPRESSION / ASSESSMENT AND PLAN / ED COURSE  Pertinent labs & imaging results that were available during my care of the patient were reviewed by me and considered in my medical decision making (see chart for details).  59 year old male with a history of CHF who presents with increasing lower extremity edema and shortness of breath. Resting room air oxygenation 93%. Decreases to 90% on exertion. Will initiate nebulizer treatments for wheezing, obtain screening lab work, chest x-ray and reassess.  ----------------------------------------- 7:29 AM on 03/26/2015 -----------------------------------------  Room air saturations 85% after nebulizer treatment. Complains of chest tightness. 2 L O2 via nasal cannula placed with saturations increased to 92%. Will administer IV Lasix. Will discuss with hospitalist to evaluate in the emergency department for admission.  ----------------------------------------- 8:49 AM on 03/26/2015 -----------------------------------------  Dr. Volanda Napoleon evaluated patient in the emergency department. After her telephone consultation with Dr. Doy Hutching, who is patient's PCP, he is recommending to discharge the patient home on [place]. This patient is well-known to Dr. Doy Hutching, and despite his sats of 86% on room air on ambulation, he is comfortable discharging the patient home with follow-up in clinic in the next 1-2 days. I have discussed this with the patient who is  comfortable with the plan. I have given him strict return precautions. Patient verbalizes understanding and agrees with plan of  care. ____________________________________________   FINAL CLINICAL IMPRESSION(S) / ED DIAGNOSES  Final diagnoses:  Shortness of breath  Acute on chronic congestive heart failure, unspecified congestive heart failure type ([place])  Hypoxia  COPD exacerbation ([place])      Paulette Blanch, MD 03/26/15 [id number]  Paulette Blanch, MD 03/26/15 [place], MD 03/26/15 [phone]

## 2015-03-26 NOTE — Discharge Instructions (Signed)
1. Please call Dr. Doy Hutching office for follow-up in the next 1-2 days. 2. Take Medrol Dosepak as prescribed. 3. Return to the ER for worsening symptoms, persistent vomiting, worsening shortness breath, or other concerns.  [facility] appointment on April 10, 2015 at 1:00pm with Darylene Price, [place]. Please call [phone] to reschedule.   Heart Failure Heart failure is a condition in which the heart has trouble pumping blood. This means your heart does not pump blood efficiently for your body to work well. In some cases of heart failure, fluid may back up into your lungs or you may have swelling (edema) in your lower legs. Heart failure is usually a long-term (chronic) condition. It is important for you to take good care of yourself and follow your health care provider's treatment plan. CAUSES  Some health conditions can cause heart failure. Those health conditions include:  High blood pressure (hypertension). Hypertension causes the heart muscle to work harder than normal. When pressure in the blood vessels is high, the heart needs to pump (contract) with more force in order to circulate blood throughout the body. High blood pressure eventually causes the heart to become stiff and weak.  Coronary artery disease (CAD). CAD is the buildup of cholesterol and fat (plaque) in the arteries of the heart. The blockage in the arteries deprives the heart muscle of oxygen and blood. This can cause chest pain and may lead to a heart attack. High blood pressure can also contribute to CAD.  Heart attack (myocardial infarction). A heart attack occurs when one or more arteries in the heart become blocked. The loss of oxygen damages the muscle tissue of the heart. When this happens, part of the heart muscle dies. The injured tissue does not contract as well and weakens the heart's ability to pump blood.  Abnormal heart valves. When the heart valves do not open and close properly, it can cause heart failure.  This makes the heart muscle pump harder to keep the blood flowing.  Heart muscle disease (cardiomyopathy or myocarditis). Heart muscle disease is damage to the heart muscle from a variety of causes. These can include drug or alcohol abuse, infections, or unknown reasons. These can increase the risk of heart failure.  Lung disease. Lung disease makes the heart work harder because the lungs do not work properly. This can cause a strain on the heart, leading it to fail.  Diabetes. Diabetes increases the risk of heart failure. High blood sugar contributes to high fat (lipid) levels in the blood. Diabetes can also cause slow damage to tiny blood vessels that carry important nutrients to the heart muscle. When the heart does not get enough oxygen and food, it can cause the heart to become weak and stiff. This leads to a heart that does not contract efficiently.  Other conditions can contribute to heart failure. These include abnormal heart rhythms, thyroid problems, and low blood counts (anemia). Certain unhealthy behaviors can increase the risk of heart failure, including:  Being overweight.  Smoking or chewing tobacco.  Eating foods high in fat and cholesterol.  Abusing illicit drugs or alcohol.  Lacking physical activity. SYMPTOMS  Heart failure symptoms may vary and can be hard to detect. Symptoms may include:  Shortness of breath with activity, such as climbing stairs.  Persistent cough.  Swelling of the feet, ankles, legs, or abdomen.  Unexplained weight gain.  Difficulty breathing when lying flat (orthopnea).  Waking from sleep because of the need to sit up and get more  air.  Rapid heartbeat.  Fatigue and loss of energy.  Feeling light-headed, dizzy, or close to fainting.  Loss of appetite.  Nausea.  Increased urination during the night (nocturia). DIAGNOSIS  A diagnosis of heart failure is based on your history, symptoms, physical examination, and diagnostic tests.  Diagnostic tests for heart failure may include:  Echocardiography.  Electrocardiography.  Chest X-ray.  Blood tests.  Exercise stress test.  Cardiac angiography.  Radionuclide scans. TREATMENT  Treatment is aimed at managing the symptoms of heart failure. Medicines, behavioral changes, or surgical intervention may be necessary to treat heart failure.  Medicines to help treat heart failure may include:  Angiotensin-converting enzyme (ACE) inhibitors. This type of medicine blocks the effects of a blood protein called angiotensin-converting enzyme. ACE inhibitors relax (dilate) the blood vessels and help lower blood pressure.  Angiotensin receptor blockers (ARBs). This type of medicine blocks the actions of a blood protein called angiotensin. Angiotensin receptor blockers dilate the blood vessels and help lower blood pressure.  Water pills (diuretics). Diuretics cause the kidneys to remove salt and water from the blood. The extra fluid is removed through urination. This loss of extra fluid lowers the volume of blood the heart pumps.  Beta blockers. These prevent the heart from beating too fast and improve heart muscle strength.  Digitalis. This increases the force of the heartbeat.  Healthy behavior changes include:  Obtaining and maintaining a healthy weight.  Stopping smoking or chewing tobacco.  Eating heart-healthy foods.  Limiting or avoiding alcohol.  Stopping illicit drug use.  Physical activity as directed by your health care provider.  Surgical treatment for heart failure may include:  A procedure to open blocked arteries, repair damaged heart valves, or remove damaged heart muscle tissue.  A pacemaker to improve heart muscle function and control certain abnormal heart rhythms.  An internal cardioverter defibrillator to treat certain serious abnormal heart rhythms.  A left ventricular assist device (LVAD) to assist the pumping ability of the heart. HOME CARE  INSTRUCTIONS   Take medicines only as directed by your health care provider. Medicines are important in reducing the workload of your heart, slowing the progression of heart failure, and improving your symptoms.  Do not stop taking your medicine unless directed by your health care provider.  Do not skip any dose of medicine.  Refill your prescriptions before you run out of medicine. Your medicines are needed every day.  Engage in moderate physical activity if directed by your health care provider. Moderate physical activity can benefit some people. The elderly and people with severe heart failure should consult with a health care provider for physical activity recommendations.  Eat heart-healthy foods. Food choices should be free of trans fat and low in saturated fat, cholesterol, and salt (sodium). Healthy choices include fresh or frozen fruits and vegetables, fish, lean meats, legumes, fat-free or low-fat dairy products, and whole grain or high fiber foods. Talk to a dietitian to learn more about heart-healthy foods.  Limit sodium if directed by your health care provider. Sodium restriction may reduce symptoms of heart failure in some people. Talk to a dietitian to learn more about heart-healthy seasonings.  Use healthy cooking methods. Healthy cooking methods include roasting, grilling, broiling, baking, poaching, steaming, or stir-frying. Talk to a dietitian to learn more about healthy cooking methods.  Limit fluids if directed by your health care provider. Fluid restriction may reduce symptoms of heart failure in some people.  Weigh yourself every day. Daily weights are important in  the early recognition of excess fluid. You should weigh yourself every morning after you urinate and before you eat breakfast. Wear the same amount of clothing each time you weigh yourself. Record your daily weight. Provide your health care provider with your weight record.  Monitor and record your blood  pressure if directed by your health care provider.  Check your pulse if directed by your health care provider.  Lose weight if directed by your health care provider. Weight loss may reduce symptoms of heart failure in some people.  Stop smoking or chewing tobacco. Nicotine makes your heart work harder by causing your blood vessels to constrict. Do not use nicotine gum or patches before talking to your health care provider.  Keep all follow-up visits as directed by your health care provider. This is important.  Limit alcohol intake to no more than 1 drink per day for nonpregnant women and 2 drinks per day for men. One drink equals 12 ounces of beer, 5 ounces of wine, or 1 ounces of hard liquor. Drinking more than that is harmful to your heart. Tell your health care provider if you drink alcohol several times a week. Talk with your health care provider about whether alcohol is safe for you. If your heart has already been damaged by alcohol or you have severe heart failure, drinking alcohol should be stopped completely.  Stop illicit drug use.  Stay up-to-date with immunizations. It is especially important to prevent respiratory infections through current pneumococcal and influenza immunizations.  Manage other health conditions such as hypertension, diabetes, thyroid disease, or abnormal heart rhythms as directed by your health care provider.  Learn to manage stress.  Plan rest periods when fatigued.  Learn strategies to manage high temperatures. If the weather is extremely hot:  Avoid vigorous physical activity.  Use air conditioning or fans or seek a cooler location.  Avoid caffeine and alcohol.  Wear loose-fitting, lightweight, and light-colored clothing.  Learn strategies to manage cold temperatures. If the weather is extremely cold:  Avoid vigorous physical activity.  Layer clothes.  Wear mittens or gloves, a hat, and a scarf when going outside.  Avoid alcohol.  Obtain  ongoing education and support as needed.  Participate in or seek rehabilitation as needed to maintain or improve independence and quality of life. SEEK MEDICAL CARE IF:   You have a rapid weight gain.  You have increasing shortness of breath that is unusual for you.  You are unable to participate in your usual physical activities.  You tire easily.  You cough more than normal, especially with physical activity.  You have any or more swelling in areas such as your hands, feet, ankles, or abdomen.  You are unable to sleep because it is hard to breathe.  You feel like your heart is beating fast (palpitations).  You become dizzy or light-headed upon standing up. SEEK IMMEDIATE MEDICAL CARE IF:   You have difficulty breathing.  There is a change in mental status such as decreased alertness or difficulty with concentration.  You have a pain or discomfort in your chest.  You have an episode of fainting (syncope). MAKE SURE YOU:   Understand these instructions.  Will watch your condition.  Will get help right away if you are not doing well or get worse.   This information is not intended to replace advice given to you by your health care provider. Make sure you discuss any questions you have with your health care provider.   Document Released:  06/02/2005 Document Revised: 10/17/2014 Document Reviewed: 07/02/2012 Elsevier Interactive Patient Education 2016 [place] [employer] Shortness of breath means you have trouble breathing. It could also mean that you have a medical problem. You should get immediate medical care for shortness of breath. CAUSES   Not enough oxygen in the air such as with high altitudes or a smoke-filled room.  Certain lung diseases, infections, or problems.  Heart disease or conditions, such as angina or heart failure.  Low red blood cells (anemia).  Poor physical fitness, which can cause shortness of breath when you  exercise.  Chest or back injuries or stiffness.  Being overweight.  Smoking.  Anxiety, which can make you feel like you are not getting enough air. DIAGNOSIS  Serious medical problems can often be found during your physical exam. Tests may also be done to determine why you are having shortness of breath. Tests may include:  Chest X-rays.  Lung function tests.  Blood tests.  An electrocardiogram (ECG).  An ambulatory electrocardiogram. An ambulatory ECG records your heartbeat patterns over a 24-hour period.  Exercise testing.  A transthoracic echocardiogram (TTE). During echocardiography, sound waves are used to evaluate how blood flows through your heart.  A transesophageal echocardiogram (TEE).  Imaging scans. Your health care provider may not be able to find a cause for your shortness of breath after your exam. In this case, it is important to have a follow-up exam with your health care provider as directed.  TREATMENT  Treatment for shortness of breath depends on the cause of your symptoms and can vary greatly. HOME CARE INSTRUCTIONS   Do not smoke. Smoking is a common cause of shortness of breath. If you smoke, ask for help to quit.  Avoid being around chemicals or things that may bother your breathing, such as paint fumes and dust.  Rest as needed. Slowly resume your usual activities.  If medicines were prescribed, take them as directed for the full length of time directed. This includes oxygen and any inhaled medicines.  Keep all follow-up appointments as directed by your health care provider. SEEK MEDICAL CARE IF:   Your condition does not improve in the time expected.  You have a hard time doing your normal activities even with rest.  You have any new symptoms. SEEK IMMEDIATE MEDICAL CARE IF:   Your shortness of breath gets worse.  You feel light-headed, faint, or develop a cough not controlled with medicines.  You start coughing up blood.  You have  pain with breathing.  You have chest pain or pain in your arms, shoulders, or abdomen.  You have a fever.  You are unable to walk up stairs or exercise the way you normally do. MAKE SURE YOU:  Understand these instructions.  Will watch your condition.  Will get help right away if you are not doing well or get worse.   This information is not intended to replace advice given to you by your health care provider. Make sure you discuss any questions you have with your health care provider.   Document Released: 02/25/2001 Document Revised: 06/07/2013 Document Reviewed: 08/18/2011 Elsevier Interactive Patient Education 2016 [employer]  Chronic Obstructive Pulmonary Disease Exacerbation Chronic obstructive pulmonary disease (COPD) is a common lung problem. In COPD, the flow of air from the lungs is limited. COPD exacerbations are times that breathing gets worse and you need extra treatment. Without treatment they can be life threatening. If they happen often, your lungs can become more damaged.  If your COPD gets worse, your doctor may treat you with:  Medicines.  Oxygen.  Different ways to clear your airway, such as using a mask. HOME CARE  Do not smoke.  Avoid tobacco smoke and other things that bother your lungs.  If given, take your antibiotic medicine as told. Finish the medicine even if you start to feel better.  Only take medicines as told by your doctor.  Drink enough fluids to keep your pee (urine) clear or pale yellow (unless your doctor has told you not to).  Use a cool mist machine (vaporizer).  If you use oxygen or a machine that turns liquid medicine into a mist (nebulizer), continue to use them as told.  Keep up with shots (vaccinations) as told by your doctor.  Exercise regularly.  Eat healthy foods.  Keep all doctor visits as told. GET HELP RIGHT AWAY IF:  You are very short of breath and it gets worse.  You have trouble talking.  You have bad  chest pain.  You have blood in your spit (sputum).  You have a fever.  You keep throwing up (vomiting).  You feel weak, or you pass out (faint).  You feel confused.  You keep getting worse. MAKE SURE YOU:  Understand these instructions.  Will watch your condition.  Will get help right away if you are not doing well or get worse.   This information is not intended to replace advice given to you by your health care provider. Make sure you discuss any questions you have with your health care provider.   Document Released: 05/22/2011 Document Revised: 06/23/2014 Document Reviewed: 02/04/2013 Elsevier Interactive Patient Education [employer].

## 2015-03-29 DIAGNOSIS — J4540 Moderate persistent asthma, uncomplicated: Secondary | ICD-10-CM | POA: Diagnosis not present

## 2015-03-29 DIAGNOSIS — G4733 Obstructive sleep apnea (adult) (pediatric): Secondary | ICD-10-CM | POA: Diagnosis not present

## 2015-04-05 DIAGNOSIS — L851 Acquired keratosis [keratoderma] palmaris et plantaris: Secondary | ICD-10-CM | POA: Diagnosis not present

## 2015-04-05 DIAGNOSIS — B351 Tinea unguium: Secondary | ICD-10-CM | POA: Diagnosis not present

## 2015-04-05 DIAGNOSIS — Z794 Long term (current) use of insulin: Secondary | ICD-10-CM | POA: Diagnosis not present

## 2015-04-05 DIAGNOSIS — E1140 Type 2 diabetes mellitus with diabetic neuropathy, unspecified: Secondary | ICD-10-CM | POA: Diagnosis not present

## 2015-04-10 ENCOUNTER — Emergency Department: Payer: Commercial Managed Care - HMO

## 2015-04-10 ENCOUNTER — Encounter: Payer: Self-pay | Admitting: Emergency Medicine

## 2015-04-10 ENCOUNTER — Ambulatory Visit: Payer: Commercial Managed Care - HMO | Admitting: Family

## 2015-04-10 ENCOUNTER — Emergency Department
Admission: EM | Admit: 2015-04-10 | Discharge: 2015-04-10 | Disposition: A | Discharge status: Home / Self Care | Payer: Commercial Managed Care - HMO | Attending: Emergency Medicine | Admitting: Emergency Medicine

## 2015-04-10 DIAGNOSIS — I2510 Atherosclerotic heart disease of native coronary artery without angina pectoris: Secondary | ICD-10-CM | POA: Insufficient documentation

## 2015-04-10 DIAGNOSIS — R0602 Shortness of breath: Secondary | ICD-10-CM | POA: Diagnosis not present

## 2015-04-10 DIAGNOSIS — Z7982 Long term (current) use of aspirin: Secondary | ICD-10-CM | POA: Insufficient documentation

## 2015-04-10 DIAGNOSIS — R079 Chest pain, unspecified: Secondary | ICD-10-CM | POA: Diagnosis not present

## 2015-04-10 DIAGNOSIS — Z79899 Other long term (current) drug therapy: Secondary | ICD-10-CM | POA: Insufficient documentation

## 2015-04-10 DIAGNOSIS — I5022 Chronic systolic (congestive) heart failure: Secondary | ICD-10-CM | POA: Insufficient documentation

## 2015-04-10 DIAGNOSIS — E119 Type 2 diabetes mellitus without complications: Secondary | ICD-10-CM | POA: Insufficient documentation

## 2015-04-10 DIAGNOSIS — G8929 Other chronic pain: Secondary | ICD-10-CM | POA: Diagnosis not present

## 2015-04-10 DIAGNOSIS — R0789 Other chest pain: Secondary | ICD-10-CM | POA: Diagnosis not present

## 2015-04-10 DIAGNOSIS — R600 Localized edema: Secondary | ICD-10-CM | POA: Insufficient documentation

## 2015-04-10 DIAGNOSIS — I213 ST elevation (STEMI) myocardial infarction of unspecified site: Secondary | ICD-10-CM | POA: Diagnosis not present

## 2015-04-10 DIAGNOSIS — I10 Essential (primary) hypertension: Secondary | ICD-10-CM | POA: Diagnosis not present

## 2015-04-10 DIAGNOSIS — J441 Chronic obstructive pulmonary disease with (acute) exacerbation: Secondary | ICD-10-CM | POA: Insufficient documentation

## 2015-04-10 LAB — BASIC METABOLIC PANEL
Anion gap: 11 (ref 5–15)
BUN: 19 mg/dL (ref 6–20)
CALCIUM: 9 mg/dL (ref 8.9–10.3)
CO2: 24 mmol/L (ref 22–32)
CREATININE: 1.12 mg/dL (ref 0.61–1.24)
Chloride: 104 mmol/L (ref 101–111)
GFR calc Af Amer: >60 mL/min (ref >60)
GFR calc non Af Amer: >60 mL/min (ref >60)
GLUCOSE: 235 mg/dL — AB (ref 65–99)
Potassium: 4 mmol/L (ref 3.5–5.1)
Sodium: 139 mmol/L (ref 135–145)

## 2015-04-10 LAB — CBC
HCT: 35.6 % — ABNORMAL LOW (ref 40.0–52.0)
Hemoglobin: 11.6 g/dL — ABNORMAL LOW (ref 13.0–18.0)
MCH: 25.3 pg — AB (ref 26.0–34.0)
MCHC: 32.5 g/dL (ref 32.0–36.0)
MCV: 77.8 fL — ABNORMAL LOW (ref 80.0–100.0)
PLATELETS: 140 10*3/uL — AB (ref 150–440)
RBC: 4.58 MIL/uL (ref 4.40–5.90)
RDW: 16.9 % — AB (ref 11.5–14.5)
WBC: 8.1 10*3/uL (ref 3.8–10.6)

## 2015-04-10 LAB — TROPONIN I

## 2015-04-10 NOTE — ED Provider Notes (Addendum)
[facility] Emergency Department Provider Note  ____________________________________________  Time seen: Approximately 1:56 PM  I have reviewed the triage vital signs and the nursing notes.   HISTORY  Chief Complaint Chest Pain and Shortness of Breath    HPI Marcus Williams is a 59 y.o. male with a history of an MI and hypertension who is presenting today with worsening shortness of breath over the past week. He says that he has had this issue for months. He says that it has been relieved with steroids but then returns after he finish his course. He says it is also associated with left-sided chest pain which is sharp. He denies any nausea vomiting or diaphoresis. He says that his symptoms are worsened with exertion. He did have a CAT scan this past year which was negative for a pulmonary embolus. He was last seen in the emergency department on October 10.   Past Medical History  Diagnosis Date  . MI (myocardial infarction) ([place])   . Sepsis ([place])   . History of cardioversion   . Diabetes mellitus without complication ([place])   . Hypertension   . CAD (coronary artery disease)   . SVT (supraventricular tachycardia) ([place])   . OSA on CPAP   . A-fib ([place])   . Chronic systolic CHF (congestive heart failure) [facility])     Patient Active Problem List   Diagnosis Date Noted  . Acute respiratory failure with hypoxia ([place]) 03/26/2015  . CHF (congestive heart failure) ([place]) 01/15/2015  . HTN (hypertension) 01/14/2015  . Type II diabetes mellitus ([place]) 01/14/2015  . COPD (chronic obstructive pulmonary disease) ([place]) 01/14/2015  . OSA on CPAP 01/14/2015  . CAD (coronary artery disease) 01/14/2015  . A-fib ([place]) 01/14/2015  . Pain of left calf 01/14/2015  . Acute on chronic systolic CHF (congestive heart failure) ([place]) 01/14/2015  . COPD exacerbation ([place]) 12/22/2014  . Diabetes ([place]) 10/14/2014    Past Surgical History  Procedure Laterality Date  . Quadruple bypass     . Knee surgery      Current Outpatient Rx  Name  Route  Sig  Dispense  Refill  . albuterol (PROVENTIL HFA;VENTOLIN HFA) 108 (90 BASE) MCG/ACT inhaler   Inhalation   Inhale 2 puffs into the lungs every 6 (six) hours as needed for wheezing or shortness of breath.   1 Inhaler   0   . aspirin EC 325 MG tablet   Oral   Take 325 mg by mouth daily.         Marland Kitchen diltiazem (CARDIZEM CD) 300 MG 24 hr capsule   Oral   Take 1 capsule (300 mg total) by mouth daily.   30 capsule   5   . fluticasone (FLONASE) 50 MCG/ACT nasal spray   Each Nare   Place 1 spray into both nostrils daily.         . Fluticasone-Salmeterol (ADVAIR DISKUS) 250-50 MCG/DOSE AEPB   Inhalation   Inhale 1 puff into the lungs 2 (two) times daily.         . furosemide (LASIX) 40 MG tablet   Oral   Take 1 tablet (40 mg total) by mouth 2 (two) times daily.   60 tablet   5   . glimepiride (AMARYL) 4 MG tablet   Oral   Take 4 mg by mouth daily.         . insulin aspart (NOVOLOG) 100 UNIT/ML injection   Subcutaneous   Inject 40 Units into the skin  daily.          . metFORMIN (GLUCOPHAGE) 1000 MG tablet   Oral   Take 1,000 mg by mouth 2 (two) times daily with a meal.         . methylPREDNISolone (MEDROL DOSEPAK) 4 MG TBPK tablet      Take as directed   21 tablet   0   . metoprolol tartrate (LOPRESSOR) 25 MG tablet   Oral   Take 1 tablet (25 mg total) by mouth 2 (two) times daily.   60 tablet   5   . nitroGLYCERIN (NITROSTAT) 0.4 MG SL tablet   Sublingual   Place 0.4 mg under the tongue every 5 (five) minutes as needed for chest pain.         . pantoprazole (PROTONIX) 40 MG tablet   Oral   Take 40 mg by mouth daily.         . pramipexole (MIRAPEX) 0.5 MG tablet   Oral   Take 0.5 mg by mouth at bedtime.         . pregabalin (LYRICA) 75 MG capsule   Oral   Take 75 mg by mouth 2 (two) times daily.         . simvastatin (ZOCOR) 40 MG tablet   Oral   Take 40 mg by mouth  daily.         . traZODone (DESYREL) 100 MG tablet   Oral   Take 100 mg by mouth at bedtime.         Alveda Reasons 15 MG TABS tablet   Oral   Take 1 tablet by mouth daily.           Dispense as written.     Allergies Review of patient's allergies indicates no known allergies.  Family History  Problem Relation Age of Onset  . Stroke Mother   . Heart attack Mother   . Hypertension Mother   . Heart attack Father   . Hypertension Father   . Heart attack Brother     #1  . Diabetes Brother     #1  . Heart disease Brother     #2  . Lung disease Brother     #2  . Hypertension Brother     #2  . Diabetes Brother     #2    Social History Social History  Substance Use Topics  . Smoking status: Never Smoker   . Smokeless tobacco: None  . Alcohol Use: No    Review of Systems Constitutional: No fever/chills Eyes: No visual changes. ENT: No sore throat. Cardiovascular: As above Respiratory: As above Gastrointestinal: No abdominal pain.  No nausea, no vomiting.  No diarrhea.  No constipation. Genitourinary: Negative for dysuria. Musculoskeletal: Negative for back pain. Skin: Negative for rash. Neurological: Negative for headaches, focal weakness or numbness.  10-point ROS otherwise negative.  ____________________________________________   PHYSICAL EXAM:  VITAL SIGNS: ED Triage Vitals  Enc Vitals Group     BP 04/10/15 1149 161/62 mmHg     Pulse Rate 04/10/15 1149 66     Resp 04/10/15 1149 30     Temp 04/10/15 1149 97.8 F (36.6 C)     Temp Source 04/10/15 1149 Oral     SpO2 04/10/15 1149 97 %     Weight 04/10/15 1149 290 lb (131.543 kg)     Height 04/10/15 1149 5\' 7"  (1.702 m)     Head Cir --      Peak Flow --  Pain Score 04/10/15 1144 7     Pain Loc --      Pain Edu? --      Excl. in [place]? --     Constitutional: Alert and oriented. Well appearing and in no acute distress. Eyes: Conjunctivae are normal. PERRL. EOMI. Head: Atraumatic. Nose: No  congestion/rhinnorhea. Mouth/Throat: Mucous membranes are moist.   Neck: No stridor.   Cardiovascular: Normal rate, regular rhythm. Grossly normal heart sounds.  Good peripheral circulation. Respiratory: Tachypneic but speaking in full sentences and without any respiratory distress.  No retractions. Lungs CTAB. Gastrointestinal: Soft and nontender. No distention. No abdominal bruits. No CVA tenderness. Musculoskeletal: Mild to moderate bilateral lower extremity edema which is unchanged from the patient's baseline..  No joint effusions. Neurologic:  Normal speech and language. No gross focal neurologic deficits are appreciated. No gait instability. Skin:  Skin is warm, dry and intact. No rash noted. Psychiatric: Mood and affect are normal. Speech and behavior are normal.  ____________________________________________   LABS (all labs ordered are listed, but only abnormal results are displayed)  Labs Reviewed  BASIC METABOLIC PANEL - Abnormal; Notable for the following:    Glucose, Bld 235 (*)    All other components within normal limits  CBC - Abnormal; Notable for the following:    Hemoglobin 11.6 (*)    HCT 35.6 (*)    MCV 77.8 (*)    MCH 25.3 (*)    RDW 16.9 (*)    Platelets 140 (*)    All other components within normal limits  TROPONIN I   ____________________________________________  EKG  ED ECG REPORT I, Doran Stabler, the attending physician, personally viewed and interpreted this ECG.   Date: 04/10/2015  EKG Time: 1145  Rate: 67  Rhythm: normal sinus rhythm  Axis: Normal axis  Intervals:none  ST&T Change: T-wave inversion in aVL. Otherwise no abnormal T-wave inversions. No ST elevation or depression.  ____________________________________________  RADIOLOGY  Stable chest x-ray without any acute abnormality. ____________________________________________   PROCEDURES    ____________________________________________   INITIAL IMPRESSION / ASSESSMENT  AND PLAN / ED COURSE  Pertinent labs & imaging results that were available during my care of the patient were reviewed by me and considered in my medical decision making (see chart for details).  ----------------------------------------- 2:21 PM on 04/10/2015 -----------------------------------------  Discussed case with Dr. Doy Hutching who was seen the patient multiple times for similar complaints over the last 2 years. Dr. Doy Hutching states that the patient has had multiple tests including a negative CTA and Geodon which did not show pulmonary embolus this past June, a stress test this past December which did not result in any further intervention and also a catheterization in March 2015. Dr. Doy Hutching says that the patient chronically is short of breath with a mild degree of tachypnea in the low 20s. This is consistent with the patient's presentation today. Furthermore, the patient says that he has had this left upper quadrant/left lower chest pain over the past several weeks. Despite this the patient does not have any changes found on his EKG and has a negative troponin. His labs are reassuring. I discussed with the patient as well as the Dr. Doy Hutching on the phone that he would be able to see the patient in the office within the next few days. The patient says that by calling the office this morning he was unable see Dr. Doy Hutching. He said that the scheduling person at the office said that he would have to see  a different doctor. This is why he did not go to the primary care doctor's office today. The patient will call Dr. Doy Hutching office as soon as he is discharged to schedule an urgent follow-up appointment within the next 3 days. The patient understands the plan and is willing to comply. We reviewed his lab as well as imaging results. ____________________________________________   FINAL CLINICAL IMPRESSION(S) / ED DIAGNOSES  Chronic chest pain with shortness of breath.    Orbie Pyo, MD 04/10/15  1424  In addition to above the patient did not have any tenderness on palpation of his spine. He did not report any loss of bowel or bladder continence. He had 5 out of 5 strength to bilateral lower extremities and was able to ambulate with a normal gait without any assistance. The patient's back pain is also likely related to his chronic symptoms as he said that this is been ongoing. The may be a sciatic component which is likely mild.  Orbie Pyo, MD 04/10/15 [phone]

## 2015-04-10 NOTE — ED Notes (Signed)
Pt complains of chest pain with shortness of breath and numbness of fingers x 2days with progressively worsen. Pt was seen 2 weeks ago for same symptoms, has hx of cardiac bypass.

## 2015-04-10 NOTE — ED Notes (Signed)
Pt states increasing SOB, states left sided chest pain that feels as if someone if stabbing him, states back pain radiating to his left leg, also states hand tingling and numbness, pt states he is unable to walk around the house without being SOB, pt awake and alert, speaking in full sentances in no acute distress

## 2015-04-13 ENCOUNTER — Ambulatory Visit (INDEPENDENT_AMBULATORY_CARE_PROVIDER_SITE_OTHER): Payer: Commercial Managed Care - HMO | Admitting: Internal Medicine

## 2015-04-13 ENCOUNTER — Encounter: Payer: Self-pay | Admitting: Internal Medicine

## 2015-04-13 VITALS — BP 140/78 | HR 57 | Ht 68.0 in | Wt 291.0 lb

## 2015-04-13 DIAGNOSIS — G4733 Obstructive sleep apnea (adult) (pediatric): Secondary | ICD-10-CM

## 2015-04-13 DIAGNOSIS — R0600 Dyspnea, unspecified: Secondary | ICD-10-CM | POA: Diagnosis not present

## 2015-04-13 NOTE — Progress Notes (Signed)
[facility] Consultation      Date: 04/13/2015,   MRN# [id number] KEONTA ALSIP 1955/09/14 Code Status:  Hosp day:@LENGTHOFSTAYDAYS @ Referring MD: @ATDPROV @     PCP:      AdmissionWeight: 291 lb (131.997 kg)                 CurrentWeight: 291 lb (131.997 kg) Marcus Williams is a 59 y.o. old male seen in consultation for chronic SOB     CHIEF COMPLAINT:   SOB, DOE    HISTORY OF PRESENT ILLNESS   59 yo obese white male seen today for chronic worsening of SOB and DOE-his symptoms have been going on for several years It seems that his SOB and DOE has progressively worsened over the past few months , patient has OSA and uses CPAP-states that he is compliant and has been checked several months ago Patient has intermittent leg swelling for last couple of weeks  He has had CAD/CABG and follows up with Dr Ubaldo Glassing, patient has seen Dr. Raul Del 11/2014 and PFT shows some mild obstructive disease. He takes Advair and this does nOT seem to help his SOB Patient takes Albuterol Neb intermittently and this helps his breathing Patient takes lasix as prescribed Patient has no fevers, chills,NVD  CT chest(01/2015) reviewed images on 10/28 No PE, no ILD   PAST MEDICAL HISTORY   Past Medical History  Diagnosis Date  . MI (myocardial infarction) ([place])   . Sepsis ([place])   . History of cardioversion   . Diabetes mellitus without complication ([place])   . Hypertension   . CAD (coronary artery disease)   . SVT (supraventricular tachycardia) ([place])   . OSA on CPAP   . A-fib ([place])   . Chronic systolic CHF (congestive heart failure) ([place])      SURGICAL HISTORY   Past Surgical History  Procedure Laterality Date  . Quadruple bypass    . Knee surgery       FAMILY HISTORY   Family History  Problem Relation Age of Onset  . Stroke Mother   . Heart attack Mother   . Hypertension Mother   . Heart attack Father   . Hypertension Father   . Heart attack Brother     #1  .  Diabetes Brother     #1  . Heart disease Brother     #2  . Lung disease Brother     #2  . Hypertension Brother     #2  . Diabetes Brother     #2     SOCIAL HISTORY   Social History  Substance Use Topics  . Smoking status: Never Smoker   . Smokeless tobacco: None  . Alcohol Use: No     MEDICATIONS    Home Medication:  Current Outpatient Rx  Name  Route  Sig  Dispense  Refill  . albuterol (PROVENTIL HFA;VENTOLIN HFA) 108 (90 BASE) MCG/ACT inhaler   Inhalation   Inhale 2 puffs into the lungs every 6 (six) hours as needed for wheezing or shortness of breath.   1 Inhaler   0   . aspirin EC 325 MG tablet   Oral   Take 325 mg by mouth daily.         Marland Kitchen diltiazem (CARDIZEM CD) 300 MG 24 hr capsule   Oral   Take 1 capsule (300 mg total) by mouth daily.   30 capsule   5   . fluticasone (FLONASE) 50 MCG/ACT nasal spray  Each Nare   Place 1 spray into both nostrils daily.         . Fluticasone-Salmeterol (ADVAIR DISKUS) 250-50 MCG/DOSE AEPB   Inhalation   Inhale 1 puff into the lungs 2 (two) times daily.         . furosemide (LASIX) 40 MG tablet   Oral   Take 1 tablet (40 mg total) by mouth 2 (two) times daily.   60 tablet   5   . glimepiride (AMARYL) 4 MG tablet   Oral   Take 4 mg by mouth daily.         . insulin aspart (NOVOLOG) 100 UNIT/ML injection   Subcutaneous   Inject 40 Units into the skin daily.          . metFORMIN (GLUCOPHAGE) 1000 MG tablet   Oral   Take 1,000 mg by mouth 2 (two) times daily with a meal.         . methylPREDNISolone (MEDROL DOSEPAK) 4 MG TBPK tablet      Take as directed   21 tablet   0   . metoprolol tartrate (LOPRESSOR) 25 MG tablet   Oral   Take 1 tablet (25 mg total) by mouth 2 (two) times daily.   60 tablet   5   . nitroGLYCERIN (NITROSTAT) 0.4 MG SL tablet   Sublingual   Place 0.4 mg under the tongue every 5 (five) minutes as needed for chest pain.         . pantoprazole (PROTONIX) 40 MG  tablet   Oral   Take 40 mg by mouth daily.         . pramipexole (MIRAPEX) 0.5 MG tablet   Oral   Take 0.5 mg by mouth at bedtime.         . pregabalin (LYRICA) 75 MG capsule   Oral   Take 75 mg by mouth 2 (two) times daily.         . simvastatin (ZOCOR) 40 MG tablet   Oral   Take 40 mg by mouth daily.         . traZODone (DESYREL) 100 MG tablet   Oral   Take 100 mg by mouth at bedtime.         Alveda Reasons 15 MG TABS tablet   Oral   Take 1 tablet by mouth daily.           Dispense as written.     Current Medication:  Current outpatient prescriptions:  .  albuterol (PROVENTIL HFA;VENTOLIN HFA) 108 (90 BASE) MCG/ACT inhaler, Inhale 2 puffs into the lungs every 6 (six) hours as needed for wheezing or shortness of breath., Disp: 1 Inhaler, Rfl: 0 .  aspirin EC 325 MG tablet, Take 325 mg by mouth daily., Disp: , Rfl:  .  diltiazem (CARDIZEM CD) 300 MG 24 hr capsule, Take 1 capsule (300 mg total) by mouth daily., Disp: 30 capsule, Rfl: 5 .  fluticasone (FLONASE) 50 MCG/ACT nasal spray, Place 1 spray into both nostrils daily., Disp: , Rfl:  .  Fluticasone-Salmeterol (ADVAIR DISKUS) 250-50 MCG/DOSE AEPB, Inhale 1 puff into the lungs 2 (two) times daily., Disp: , Rfl:  .  furosemide (LASIX) 40 MG tablet, Take 1 tablet (40 mg total) by mouth 2 (two) times daily., Disp: 60 tablet, Rfl: 5 .  glimepiride (AMARYL) 4 MG tablet, Take 4 mg by mouth daily., Disp: , Rfl:  .  insulin aspart (NOVOLOG) 100 UNIT/ML injection, Inject 40 Units into the  skin daily. , Disp: , Rfl:  .  metFORMIN (GLUCOPHAGE) 1000 MG tablet, Take 1,000 mg by mouth 2 (two) times daily with a meal., Disp: , Rfl:  .  methylPREDNISolone (MEDROL DOSEPAK) 4 MG TBPK tablet, Take as directed, Disp: 21 tablet, Rfl: 0 .  metoprolol tartrate (LOPRESSOR) 25 MG tablet, Take 1 tablet (25 mg total) by mouth 2 (two) times daily., Disp: 60 tablet, Rfl: 5 .  nitroGLYCERIN (NITROSTAT) 0.4 MG SL tablet, Place 0.4 mg under the  tongue every 5 (five) minutes as needed for chest pain., Disp: , Rfl:  .  pantoprazole (PROTONIX) 40 MG tablet, Take 40 mg by mouth daily., Disp: , Rfl:  .  pramipexole (MIRAPEX) 0.5 MG tablet, Take 0.5 mg by mouth at bedtime., Disp: , Rfl:  .  pregabalin (LYRICA) 75 MG capsule, Take 75 mg by mouth 2 (two) times daily., Disp: , Rfl:  .  simvastatin (ZOCOR) 40 MG tablet, Take 40 mg by mouth daily., Disp: , Rfl:  .  traZODone (DESYREL) 100 MG tablet, Take 100 mg by mouth at bedtime., Disp: , Rfl:  .  XARELTO 15 MG TABS tablet, Take 1 tablet by mouth daily., Disp: , Rfl:     ALLERGIES   Review of patient's allergies indicates no known allergies.     REVIEW OF SYSTEMS   Review of Systems  Constitutional: Positive for weight loss and malaise/fatigue. Negative for fever, chills and diaphoresis.  HENT: Positive for congestion. Negative for hearing loss.   Eyes: Negative.   Respiratory: Positive for shortness of breath. Negative for cough, hemoptysis, sputum production and wheezing.   Cardiovascular: Positive for orthopnea and leg swelling.  Gastrointestinal: Negative for heartburn, nausea, vomiting and abdominal pain.  Genitourinary: Negative.   Musculoskeletal: Negative.   Skin:       Skin tears on arms  Neurological: Positive for headaches.  Endo/Heme/Allergies: Negative.   Psychiatric/Behavioral: Negative.   All other systems reviewed and are negative.    VS: BP 140/78 mmHg  Pulse 57  Ht 5\' 8"  (1.727 m)  Wt 291 lb (131.997 kg)  BMI 44.26 kg/m2  SpO2 96%     PHYSICAL EXAM  Physical Exam  Constitutional: He is oriented to person, place, and time. He appears well-developed and well-nourished. No distress.  HENT:  Head: Normocephalic and atraumatic.  Mouth/Throat: No oropharyngeal exudate.  Eyes: EOM are normal. Pupils are equal, round, and reactive to light.  Neck: Normal range of motion. Neck supple.  Cardiovascular: Normal rate and regular rhythm.   Murmur  heard. Pulmonary/Chest: No respiratory distress. He has no wheezes.  Abdominal: Soft. Bowel sounds are normal.  Musculoskeletal: Normal range of motion. He exhibits edema.  Neurological: He is alert and oriented to person, place, and time.  Skin: Skin is warm. He is not diaphoretic.  Skin tears on both arms  Psychiatric: He has a normal mood and affect.        LABS    Recent Labs     04/10/15  1151  HGB  11.6*  HCT  35.6*  MCV  77.8*  WBC  8.1  BUN  19  CREATININE  1.12  GLUCOSE  235*  CALCIUM  9.0  ,      IMAGING    Dg Chest 2 View  04/10/2015  CLINICAL DATA:  Chest pain with shortness of breath and numbness of fingers for 2 days, progressively worsening. EXAM: CHEST  2 VIEW COMPARISON:  Chest x-rays dated 03/26/2015 and 12/22/2014. FINDINGS: Heart size  is upper normal, unchanged. Overall cardiomediastinal silhouette is stable in size and configuration. Median sternotomy wires are stable in alignment. Pulmonary vascularity is within normal limits, upper normal. Lungs are clear. No pneumonia. No pleural effusion. No pneumothorax. No acute osseous abnormality. IMPRESSION: Stable chest x-ray. No evidence of acute cardiopulmonary abnormality. Electronically Signed   By: Franki Cabot M.D.   On: 04/10/2015 12:30   Dg Chest 2 View  03/26/2015  CLINICAL DATA:  Cough and wheezing for 3 days. Shortness of breath since 1 a.m. today. Pain below the left rib area. EXAM: CHEST  2 VIEW COMPARISON:  01/15/2015 FINDINGS: Postoperative changes in the mediastinum. Normal heart size and pulmonary vascularity. Linear shadow is in the mid lungs probably representing atelectasis. No focal airspace disease. No blunting of costophrenic angles. No pneumothorax. Mediastinal contours appear intact. IMPRESSION: Linear atelectasis in the mid lungs. Electronically Signed   By: Lucienne Capers M.D.   On: 03/26/2015 06:16   Reports Reveiwed 04/13/2015  CT chest 12/10/14 Images reviewed on  04/13/2015     ASSESSMENT/PLAN   59 yo obese white male with chronic SOB and DOE which likely related to multiple problems including CAD,OSA,mild COPD and his obesity.  I have explained that patient will have chronic SOB and DOE due to his underlying medica conditions  1.will need to assess for oxygen needs-check 6 MWT and overnight pulse oximetry 2.albuterol as needed for mild COPD 3.continue CPAP for OSA  Follow up in 3 months   I have personally obtained a history, examined the patient, evaluated laboratory and independently reviewed imaging results, formulated the assessment and plan and placed orders.  The Patient requires high complexity decision making for assessment and support, frequent evaluation and titration of therapies, application of advanced monitoring technologies and extensive interpretation of multiple databases.   Patient satisfied with Plan of action and management. All questions answered  [name], M.D.  [facility]  Medical Director [place] Director [facility] Cardio-Pulmonary Department

## 2015-04-13 NOTE — Patient Instructions (Signed)

## 2015-04-16 ENCOUNTER — Ambulatory Visit (INDEPENDENT_AMBULATORY_CARE_PROVIDER_SITE_OTHER): Payer: Commercial Managed Care - HMO | Admitting: Internal Medicine

## 2015-04-16 ENCOUNTER — Ambulatory Visit: Payer: Commercial Managed Care - HMO | Admitting: Family

## 2015-04-16 DIAGNOSIS — R0600 Dyspnea, unspecified: Secondary | ICD-10-CM | POA: Diagnosis not present

## 2015-04-16 DIAGNOSIS — F411 Generalized anxiety disorder: Secondary | ICD-10-CM | POA: Diagnosis not present

## 2015-04-16 DIAGNOSIS — G4733 Obstructive sleep apnea (adult) (pediatric): Secondary | ICD-10-CM | POA: Diagnosis not present

## 2015-04-16 DIAGNOSIS — I10 Essential (primary) hypertension: Secondary | ICD-10-CM | POA: Diagnosis not present

## 2015-04-16 DIAGNOSIS — E1143 Type 2 diabetes mellitus with diabetic autonomic (poly)neuropathy: Secondary | ICD-10-CM | POA: Diagnosis not present

## 2015-04-16 DIAGNOSIS — E7800 Pure hypercholesterolemia, unspecified: Secondary | ICD-10-CM | POA: Diagnosis not present

## 2015-04-16 NOTE — Progress Notes (Signed)
SMW performed today. 

## 2015-04-19 ENCOUNTER — Encounter: Payer: Self-pay | Admitting: Internal Medicine

## 2015-04-23 ENCOUNTER — Other Ambulatory Visit: Payer: Self-pay | Admitting: Internal Medicine

## 2015-04-23 ENCOUNTER — Telehealth: Payer: Self-pay | Admitting: Internal Medicine

## 2015-04-23 ENCOUNTER — Other Ambulatory Visit: Payer: Self-pay | Admitting: Cardiology

## 2015-04-23 DIAGNOSIS — R1012 Left upper quadrant pain: Secondary | ICD-10-CM | POA: Diagnosis not present

## 2015-04-23 DIAGNOSIS — I10 Essential (primary) hypertension: Secondary | ICD-10-CM | POA: Diagnosis not present

## 2015-04-23 DIAGNOSIS — R079 Chest pain, unspecified: Secondary | ICD-10-CM

## 2015-04-23 DIAGNOSIS — G4733 Obstructive sleep apnea (adult) (pediatric): Secondary | ICD-10-CM | POA: Diagnosis not present

## 2015-04-23 DIAGNOSIS — I25810 Atherosclerosis of coronary artery bypass graft(s) without angina pectoris: Secondary | ICD-10-CM | POA: Diagnosis not present

## 2015-04-23 DIAGNOSIS — I480 Paroxysmal atrial fibrillation: Secondary | ICD-10-CM | POA: Diagnosis not present

## 2015-04-23 DIAGNOSIS — E7800 Pure hypercholesterolemia, unspecified: Secondary | ICD-10-CM | POA: Diagnosis not present

## 2015-04-23 NOTE — Telephone Encounter (Signed)
Patient in office to talk to nurse.   Says she wanted him to call back to answer some questions but the line was unavailable.   Patient is available in office to answer ?'s  Misty aware and talk to patient.

## 2015-04-25 DIAGNOSIS — I25810 Atherosclerosis of coronary artery bypass graft(s) without angina pectoris: Secondary | ICD-10-CM | POA: Diagnosis not present

## 2015-04-25 DIAGNOSIS — R0789 Other chest pain: Secondary | ICD-10-CM | POA: Diagnosis not present

## 2015-04-27 ENCOUNTER — Telehealth: Payer: Self-pay | Admitting: Internal Medicine

## 2015-04-27 ENCOUNTER — Ambulatory Visit: Payer: Commercial Managed Care - HMO | Admitting: Family

## 2015-04-27 DIAGNOSIS — J449 Chronic obstructive pulmonary disease, unspecified: Secondary | ICD-10-CM

## 2015-04-27 NOTE — Telephone Encounter (Signed)
Pt informed he needs Oe @ night @ 2L. Order placed. Nothing further needed.

## 2015-04-30 ENCOUNTER — Ambulatory Visit: Admission: RE | Admit: 2015-04-30 | Payer: Commercial Managed Care - HMO | Source: Ambulatory Visit

## 2015-05-01 ENCOUNTER — Institutional Professional Consult (permissible substitution): Payer: Commercial Managed Care - HMO | Admitting: Internal Medicine

## 2015-05-01 ENCOUNTER — Ambulatory Visit
Admission: RE | Admit: 2015-05-01 | Discharge: 2015-05-01 | Disposition: A | Discharge status: Home / Self Care | Payer: Commercial Managed Care - HMO | Source: Ambulatory Visit | Attending: Cardiology | Admitting: Cardiology

## 2015-05-01 ENCOUNTER — Ambulatory Visit: Payer: Commercial Managed Care - HMO | Admitting: Family

## 2015-05-01 DIAGNOSIS — R1012 Left upper quadrant pain: Secondary | ICD-10-CM | POA: Insufficient documentation

## 2015-05-01 DIAGNOSIS — R1900 Intra-abdominal and pelvic swelling, mass and lump, unspecified site: Secondary | ICD-10-CM | POA: Diagnosis not present

## 2015-05-02 ENCOUNTER — Other Ambulatory Visit: Payer: Self-pay | Admitting: Physician Assistant

## 2015-05-02 ENCOUNTER — Telehealth: Payer: Self-pay | Admitting: Internal Medicine

## 2015-05-02 ENCOUNTER — Ambulatory Visit
Admission: RE | Admit: 2015-05-02 | Discharge: 2015-05-02 | Disposition: A | Discharge status: Home / Self Care | Payer: Commercial Managed Care - HMO | Source: Ambulatory Visit | Attending: Physician Assistant | Admitting: Physician Assistant

## 2015-05-02 DIAGNOSIS — E1143 Type 2 diabetes mellitus with diabetic autonomic (poly)neuropathy: Secondary | ICD-10-CM | POA: Diagnosis not present

## 2015-05-02 DIAGNOSIS — M79606 Pain in leg, unspecified: Secondary | ICD-10-CM

## 2015-05-02 DIAGNOSIS — R609 Edema, unspecified: Secondary | ICD-10-CM

## 2015-05-02 DIAGNOSIS — M79605 Pain in left leg: Secondary | ICD-10-CM

## 2015-05-02 DIAGNOSIS — I480 Paroxysmal atrial fibrillation: Secondary | ICD-10-CM | POA: Diagnosis not present

## 2015-05-02 DIAGNOSIS — M79604 Pain in right leg: Secondary | ICD-10-CM | POA: Diagnosis not present

## 2015-05-02 DIAGNOSIS — J449 Chronic obstructive pulmonary disease, unspecified: Secondary | ICD-10-CM

## 2015-05-02 DIAGNOSIS — M7989 Other specified soft tissue disorders: Secondary | ICD-10-CM | POA: Diagnosis not present

## 2015-05-02 NOTE — Telephone Encounter (Signed)
Had to place order for CPAP titration in lab for pt to be approved for O2 a night. Pt informed. Order placed. Nothing further needed.   Dr. Mortimer Fries, [facility] for you.

## 2015-05-04 ENCOUNTER — Inpatient Hospital Stay: Payer: Commercial Managed Care - HMO

## 2015-05-04 ENCOUNTER — Inpatient Hospital Stay
Admission: EM | Admit: 2015-05-04 | Discharge: 2015-05-08 | DRG: 580 | Disposition: A | Discharge status: Home / Self Care | Payer: Commercial Managed Care - HMO | Attending: Internal Medicine | Admitting: Internal Medicine

## 2015-05-04 ENCOUNTER — Emergency Department: Payer: Commercial Managed Care - HMO

## 2015-05-04 ENCOUNTER — Ambulatory Visit: Payer: Commercial Managed Care - HMO | Admitting: Family

## 2015-05-04 ENCOUNTER — Telehealth: Payer: Self-pay | Admitting: Family

## 2015-05-04 DIAGNOSIS — E11621 Type 2 diabetes mellitus with foot ulcer: Secondary | ICD-10-CM | POA: Diagnosis not present

## 2015-05-04 DIAGNOSIS — Z7982 Long term (current) use of aspirin: Secondary | ICD-10-CM

## 2015-05-04 DIAGNOSIS — R0602 Shortness of breath: Secondary | ICD-10-CM | POA: Diagnosis not present

## 2015-05-04 DIAGNOSIS — Z8249 Family history of ischemic heart disease and other diseases of the circulatory system: Secondary | ICD-10-CM | POA: Diagnosis not present

## 2015-05-04 DIAGNOSIS — G8918 Other acute postprocedural pain: Secondary | ICD-10-CM | POA: Diagnosis not present

## 2015-05-04 DIAGNOSIS — G4733 Obstructive sleep apnea (adult) (pediatric): Secondary | ICD-10-CM | POA: Diagnosis present

## 2015-05-04 DIAGNOSIS — Z833 Family history of diabetes mellitus: Secondary | ICD-10-CM | POA: Diagnosis not present

## 2015-05-04 DIAGNOSIS — L03031 Cellulitis of right toe: Secondary | ICD-10-CM

## 2015-05-04 DIAGNOSIS — M5431 Sciatica, right side: Secondary | ICD-10-CM | POA: Diagnosis not present

## 2015-05-04 DIAGNOSIS — B9561 Methicillin susceptible Staphylococcus aureus infection as the cause of diseases classified elsewhere: Secondary | ICD-10-CM | POA: Diagnosis present

## 2015-05-04 DIAGNOSIS — Z823 Family history of stroke: Secondary | ICD-10-CM

## 2015-05-04 DIAGNOSIS — M869 Osteomyelitis, unspecified: Secondary | ICD-10-CM

## 2015-05-04 DIAGNOSIS — L03115 Cellulitis of right lower limb: Principal | ICD-10-CM

## 2015-05-04 DIAGNOSIS — M7989 Other specified soft tissue disorders: Secondary | ICD-10-CM | POA: Diagnosis not present

## 2015-05-04 DIAGNOSIS — I4891 Unspecified atrial fibrillation: Secondary | ICD-10-CM | POA: Diagnosis present

## 2015-05-04 DIAGNOSIS — I252 Old myocardial infarction: Secondary | ICD-10-CM

## 2015-05-04 DIAGNOSIS — M47816 Spondylosis without myelopathy or radiculopathy, lumbar region: Secondary | ICD-10-CM | POA: Diagnosis not present

## 2015-05-04 DIAGNOSIS — R609 Edema, unspecified: Secondary | ICD-10-CM

## 2015-05-04 DIAGNOSIS — M25473 Effusion, unspecified ankle: Secondary | ICD-10-CM

## 2015-05-04 DIAGNOSIS — Z7951 Long term (current) use of inhaled steroids: Secondary | ICD-10-CM | POA: Diagnosis not present

## 2015-05-04 DIAGNOSIS — I2510 Atherosclerotic heart disease of native coronary artery without angina pectoris: Secondary | ICD-10-CM | POA: Diagnosis not present

## 2015-05-04 DIAGNOSIS — M25579 Pain in unspecified ankle and joints of unspecified foot: Secondary | ICD-10-CM

## 2015-05-04 DIAGNOSIS — J449 Chronic obstructive pulmonary disease, unspecified: Secondary | ICD-10-CM | POA: Diagnosis present

## 2015-05-04 DIAGNOSIS — Z79899 Other long term (current) drug therapy: Secondary | ICD-10-CM | POA: Diagnosis not present

## 2015-05-04 DIAGNOSIS — E119 Type 2 diabetes mellitus without complications: Secondary | ICD-10-CM | POA: Diagnosis not present

## 2015-05-04 DIAGNOSIS — Z951 Presence of aortocoronary bypass graft: Secondary | ICD-10-CM | POA: Diagnosis not present

## 2015-05-04 DIAGNOSIS — M5430 Sciatica, unspecified side: Secondary | ICD-10-CM

## 2015-05-04 DIAGNOSIS — I5022 Chronic systolic (congestive) heart failure: Secondary | ICD-10-CM | POA: Diagnosis present

## 2015-05-04 DIAGNOSIS — L97519 Non-pressure chronic ulcer of other part of right foot with unspecified severity: Secondary | ICD-10-CM | POA: Diagnosis not present

## 2015-05-04 DIAGNOSIS — I5023 Acute on chronic systolic (congestive) heart failure: Secondary | ICD-10-CM

## 2015-05-04 DIAGNOSIS — E1140 Type 2 diabetes mellitus with diabetic neuropathy, unspecified: Secondary | ICD-10-CM | POA: Diagnosis not present

## 2015-05-04 DIAGNOSIS — I10 Essential (primary) hypertension: Secondary | ICD-10-CM | POA: Diagnosis not present

## 2015-05-04 DIAGNOSIS — L0390 Cellulitis, unspecified: Secondary | ICD-10-CM

## 2015-05-04 DIAGNOSIS — Z9889 Other specified postprocedural states: Secondary | ICD-10-CM | POA: Diagnosis not present

## 2015-05-04 DIAGNOSIS — L089 Local infection of the skin and subcutaneous tissue, unspecified: Secondary | ICD-10-CM | POA: Diagnosis not present

## 2015-05-04 DIAGNOSIS — L97513 Non-pressure chronic ulcer of other part of right foot with necrosis of muscle: Secondary | ICD-10-CM | POA: Diagnosis not present

## 2015-05-04 DIAGNOSIS — S92421A Displaced fracture of distal phalanx of right great toe, initial encounter for closed fracture: Secondary | ICD-10-CM | POA: Diagnosis not present

## 2015-05-04 DIAGNOSIS — M79674 Pain in right toe(s): Secondary | ICD-10-CM | POA: Diagnosis not present

## 2015-05-04 DIAGNOSIS — I96 Gangrene, not elsewhere classified: Secondary | ICD-10-CM

## 2015-05-04 DIAGNOSIS — Z794 Long term (current) use of insulin: Secondary | ICD-10-CM | POA: Diagnosis not present

## 2015-05-04 DIAGNOSIS — M86171 Other acute osteomyelitis, right ankle and foot: Secondary | ICD-10-CM | POA: Diagnosis not present

## 2015-05-04 DIAGNOSIS — L02611 Cutaneous abscess of right foot: Secondary | ICD-10-CM | POA: Diagnosis not present

## 2015-05-04 LAB — CBC
HEMATOCRIT: 36.1 % — AB (ref 40.0–52.0)
HEMOGLOBIN: 11.3 g/dL — AB (ref 13.0–18.0)
MCH: 23.6 pg — AB (ref 26.0–34.0)
MCHC: 31.3 g/dL — AB (ref 32.0–36.0)
MCV: 75.3 fL — ABNORMAL LOW (ref 80.0–100.0)
Platelets: 203 10*3/uL (ref 150–440)
RBC: 4.8 MIL/uL (ref 4.40–5.90)
RDW: 17.4 % — AB (ref 11.5–14.5)
WBC: 14.8 10*3/uL — ABNORMAL HIGH (ref 3.8–10.6)

## 2015-05-04 LAB — URINALYSIS COMPLETE WITH MICROSCOPIC (ARMC ONLY)
BACTERIA UA: NONE SEEN
Bilirubin Urine: NEGATIVE
Glucose, UA: NEGATIVE mg/dL
Ketones, ur: NEGATIVE mg/dL
Leukocytes, UA: NEGATIVE
Nitrite: NEGATIVE
PH: 5 (ref 5.0–8.0)
PROTEIN: 30 mg/dL — AB
Specific Gravity, Urine: 1.017 (ref 1.005–1.030)

## 2015-05-04 LAB — COMPREHENSIVE METABOLIC PANEL
ALBUMIN: 3.9 g/dL (ref 3.5–5.0)
ALT: 27 U/L (ref 17–63)
ANION GAP: 10 (ref 5–15)
AST: 26 U/L (ref 15–41)
Alkaline Phosphatase: 66 U/L (ref 38–126)
BUN: 19 mg/dL (ref 6–20)
CHLORIDE: 100 mmol/L — AB (ref 101–111)
CO2: 28 mmol/L (ref 22–32)
Calcium: 9.3 mg/dL (ref 8.9–10.3)
Creatinine, Ser: 1.1 mg/dL (ref 0.61–1.24)
GFR calc non Af Amer: >60 mL/min (ref >60)
GLUCOSE: 147 mg/dL — AB (ref 65–99)
Potassium: 3.7 mmol/L (ref 3.5–5.1)
SODIUM: 138 mmol/L (ref 135–145)
Total Bilirubin: 0.6 mg/dL (ref 0.3–1.2)
Total Protein: 8 g/dL (ref 6.5–8.1)

## 2015-05-04 LAB — GLUCOSE, CAPILLARY: GLUCOSE-CAPILLARY: 128 mg/dL — AB (ref 65–99)

## 2015-05-04 MED ORDER — ENOXAPARIN SODIUM 150 MG/ML ~~LOC~~ SOLN
1.0000 mg/kg | Freq: Two times a day (BID) | SUBCUTANEOUS | Status: DC
  Administered 2015-05-05: 135 mg via SUBCUTANEOUS
  Filled 2015-05-04 (×5): qty 0.89

## 2015-05-04 MED ORDER — VANCOMYCIN HCL 10 G IV SOLR
1250.0000 mg | Freq: Three times a day (TID) | INTRAVENOUS | Status: DC
  Administered 2015-05-05 – 2015-05-07 (×7): 1250 mg via INTRAVENOUS
  Filled 2015-05-04 (×9): qty 1250

## 2015-05-04 MED ORDER — PIPERACILLIN-TAZOBACTAM 4.5 G IVPB
4.5000 g | Freq: Three times a day (TID) | INTRAVENOUS | Status: DC
  Administered 2015-05-05 – 2015-05-08 (×10): 4.5 g via INTRAVENOUS
  Filled 2015-05-04 (×15): qty 100

## 2015-05-04 MED ORDER — NITROGLYCERIN 0.4 MG SL SUBL: 0.4000 mg | SUBLINGUAL_TABLET | SUBLINGUAL | Status: DC | PRN

## 2015-05-04 MED ORDER — INSULIN NPH (HUMAN) (ISOPHANE) 100 UNIT/ML ~~LOC~~ SUSP: 30.0000 [IU] | Freq: Two times a day (BID) | SUBCUTANEOUS | Status: DC

## 2015-05-04 MED ORDER — FLUTICASONE PROPIONATE 50 MCG/ACT NA SUSP
1.0000 | Freq: Every day | NASAL | Status: DC
  Administered 2015-05-07 – 2015-05-08 (×2): 1 via NASAL
  Filled 2015-05-04 (×2): qty 16

## 2015-05-04 MED ORDER — GLIMEPIRIDE 2 MG PO TABS: 4.0000 mg | ORAL_TABLET | Freq: Every day | ORAL | Status: DC

## 2015-05-04 MED ORDER — MOMETASONE FURO-FORMOTEROL FUM 100-5 MCG/ACT IN AERO
2.0000 | INHALATION_SPRAY | Freq: Two times a day (BID) | RESPIRATORY_TRACT | Status: DC
  Administered 2015-05-04 – 2015-05-08 (×8): 2 via RESPIRATORY_TRACT
  Filled 2015-05-04: qty 8.8

## 2015-05-04 MED ORDER — INSULIN DETEMIR 100 UNIT/ML ~~LOC~~ SOLN
30.0000 [IU] | Freq: Two times a day (BID) | SUBCUTANEOUS | Status: DC
  Administered 2015-05-04 – 2015-05-08 (×7): 30 [IU] via SUBCUTANEOUS
  Filled 2015-05-04 (×9): qty 0.3

## 2015-05-04 MED ORDER — TRAZODONE HCL 100 MG PO TABS
100.0000 mg | ORAL_TABLET | Freq: Every day | ORAL | Status: DC
  Administered 2015-05-04 – 2015-05-07 (×4): 100 mg via ORAL
  Filled 2015-05-04 (×4): qty 1

## 2015-05-04 MED ORDER — IPRATROPIUM-ALBUTEROL 0.5-2.5 (3) MG/3ML IN SOLN
3.0000 mL | RESPIRATORY_TRACT | Status: DC | PRN
  Administered 2015-05-05: 3 mL via RESPIRATORY_TRACT
  Filled 2015-05-04: qty 3

## 2015-05-04 MED ORDER — MORPHINE SULFATE (PF) 4 MG/ML IV SOLN
4.0000 mg | Freq: Once | INTRAVENOUS | Status: AC
  Administered 2015-05-04: 4 mg via INTRAVENOUS
  Filled 2015-05-04: qty 1

## 2015-05-04 MED ORDER — ASPIRIN EC 325 MG PO TBEC
325.0000 mg | DELAYED_RELEASE_TABLET | Freq: Every day | ORAL | Status: DC
  Administered 2015-05-04 – 2015-05-08 (×4): 325 mg via ORAL
  Filled 2015-05-04 (×4): qty 1

## 2015-05-04 MED ORDER — ALPRAZOLAM 0.25 MG PO TABS
0.2500 mg | ORAL_TABLET | Freq: Two times a day (BID) | ORAL | Status: DC | PRN
  Administered 2015-05-05: 0.25 mg via ORAL
  Filled 2015-05-04: qty 1

## 2015-05-04 MED ORDER — SIMVASTATIN 40 MG PO TABS
40.0000 mg | ORAL_TABLET | Freq: Every day | ORAL | Status: DC
  Administered 2015-05-04 – 2015-05-07 (×4): 40 mg via ORAL
  Filled 2015-05-04 (×4): qty 1

## 2015-05-04 MED ORDER — VANCOMYCIN HCL IN DEXTROSE 1-5 GM/200ML-% IV SOLN
1000.0000 mg | Freq: Once | INTRAVENOUS | Status: AC
  Administered 2015-05-04: 1000 mg via INTRAVENOUS
  Filled 2015-05-04: qty 200

## 2015-05-04 MED ORDER — FUROSEMIDE 40 MG PO TABS
40.0000 mg | ORAL_TABLET | Freq: Two times a day (BID) | ORAL | Status: DC
  Administered 2015-05-04 – 2015-05-08 (×7): 40 mg via ORAL
  Filled 2015-05-04 (×7): qty 1

## 2015-05-04 MED ORDER — MORPHINE SULFATE (PF) 2 MG/ML IV SOLN
2.0000 mg | INTRAVENOUS | Status: DC | PRN
  Administered 2015-05-05: 2 mg via INTRAVENOUS
  Filled 2015-05-04 (×2): qty 1

## 2015-05-04 MED ORDER — PRAMIPEXOLE DIHYDROCHLORIDE 0.25 MG PO TABS
0.5000 mg | ORAL_TABLET | Freq: Every day | ORAL | Status: DC
  Administered 2015-05-04 – 2015-05-07 (×4): 0.5 mg via ORAL
  Filled 2015-05-04 (×4): qty 2

## 2015-05-04 MED ORDER — DILTIAZEM HCL ER COATED BEADS 300 MG PO CP24
300.0000 mg | ORAL_CAPSULE | Freq: Every day | ORAL | Status: DC
  Administered 2015-05-05 – 2015-05-08 (×3): 300 mg via ORAL
  Filled 2015-05-04 (×5): qty 1

## 2015-05-04 MED ORDER — INSULIN ASPART 100 UNIT/ML ~~LOC~~ SOLN
0.0000 [IU] | Freq: Three times a day (TID) | SUBCUTANEOUS | Status: DC
  Administered 2015-05-05 (×2): 2 [IU] via SUBCUTANEOUS
  Administered 2015-05-05: 3 [IU] via SUBCUTANEOUS
  Administered 2015-05-06: 1 [IU] via SUBCUTANEOUS
  Administered 2015-05-07: 2 [IU] via SUBCUTANEOUS
  Administered 2015-05-07: 1 [IU] via SUBCUTANEOUS
  Filled 2015-05-04: qty 3
  Filled 2015-05-04: qty 2
  Filled 2015-05-04 (×2): qty 1
  Filled 2015-05-04 (×2): qty 2

## 2015-05-04 MED ORDER — PREGABALIN 75 MG PO CAPS
75.0000 mg | ORAL_CAPSULE | Freq: Two times a day (BID) | ORAL | Status: DC
  Administered 2015-05-04 – 2015-05-08 (×6): 75 mg via ORAL
  Filled 2015-05-04 (×7): qty 1

## 2015-05-04 MED ORDER — PANTOPRAZOLE SODIUM 40 MG PO TBEC: 40.0000 mg | DELAYED_RELEASE_TABLET | Freq: Every day | ORAL | Status: DC

## 2015-05-04 MED ORDER — PIPERACILLIN-TAZOBACTAM 3.375 G IVPB
3.3750 g | Freq: Once | INTRAVENOUS | Status: AC
  Administered 2015-05-04: 3.375 g via INTRAVENOUS
  Filled 2015-05-04: qty 50

## 2015-05-04 MED ORDER — GLIMEPIRIDE 2 MG PO TABS
4.0000 mg | ORAL_TABLET | Freq: Every day | ORAL | Status: DC
  Administered 2015-05-05 – 2015-05-08 (×3): 4 mg via ORAL
  Filled 2015-05-04 (×3): qty 2

## 2015-05-04 MED ORDER — METFORMIN HCL 500 MG PO TABS
1000.0000 mg | ORAL_TABLET | Freq: Two times a day (BID) | ORAL | Status: DC
  Administered 2015-05-05 – 2015-05-08 (×6): 1000 mg via ORAL
  Filled 2015-05-04 (×6): qty 2

## 2015-05-04 MED ORDER — METOPROLOL TARTRATE 25 MG PO TABS
25.0000 mg | ORAL_TABLET | Freq: Two times a day (BID) | ORAL | Status: DC
  Administered 2015-05-04 – 2015-05-08 (×8): 25 mg via ORAL
  Filled 2015-05-04 (×8): qty 1

## 2015-05-04 MED ORDER — PANTOPRAZOLE SODIUM 40 MG PO TBEC
40.0000 mg | DELAYED_RELEASE_TABLET | Freq: Every day | ORAL | Status: DC
  Administered 2015-05-05 – 2015-05-08 (×3): 40 mg via ORAL
  Filled 2015-05-04 (×3): qty 1

## 2015-05-04 MED ORDER — ONDANSETRON HCL 4 MG/2ML IJ SOLN
4.0000 mg | Freq: Once | INTRAMUSCULAR | Status: AC
  Administered 2015-05-04: 4 mg via INTRAVENOUS
  Filled 2015-05-04: qty 2

## 2015-05-04 MED ORDER — OXYCODONE-ACETAMINOPHEN 5-325 MG PO TABS
1.0000 | ORAL_TABLET | ORAL | Status: DC | PRN
  Administered 2015-05-04 – 2015-05-08 (×11): 1 via ORAL
  Filled 2015-05-04 (×11): qty 1

## 2015-05-04 NOTE — ED Provider Notes (Signed)
[facility] Emergency Department Provider Note   ____________________________________________  Time seen: Upon arrival to ED room I have reviewed the triage vital signs and the triage nursing note.  HISTORY  Chief Complaint Leg Swelling and Chills   Historian Patient  HPI Marcus Williams is a 59 y.o. male who has a history of diabetes, MI, hypertension, is presenting today forpain at his right leg which she describes as starting in his right buttock and traveling down the posterior aspect of his right leg. Patient states he was recently evaluated and ruled out for DVT. Patient states she's been lifting and moving his wife who is ill and feels like that may have injured his low back and the pain and its going down into his right leg.  Additionally the patient has swelling to the right great toe which is been there for possibly about 1 week. He does not report any known injury. He is not complaining of much pain there.     Past Medical History  Diagnosis Date  . MI (myocardial infarction) ([place])   . Sepsis ([place])   . History of cardioversion   . Diabetes mellitus without complication ([place])   . Hypertension   . CAD (coronary artery disease)   . SVT (supraventricular tachycardia) ([place])   . OSA on CPAP   . A-fib ([place])   . Chronic systolic CHF (congestive heart failure) [facility])     Patient Active Problem List   Diagnosis Date Noted  . Acute respiratory failure with hypoxia ([place]) 03/26/2015  . CHF (congestive heart failure) ([place]) 01/15/2015  . HTN (hypertension) 01/14/2015  . Type II diabetes mellitus ([place]) 01/14/2015  . COPD (chronic obstructive pulmonary disease) ([place]) 01/14/2015  . OSA on CPAP 01/14/2015  . CAD (coronary artery disease) 01/14/2015  . A-fib ([place]) 01/14/2015  . Pain of left calf 01/14/2015  . Acute on chronic systolic CHF (congestive heart failure) ([place]) 01/14/2015  . COPD exacerbation ([place]) 12/22/2014  . Diabetes ([place]) 10/14/2014     Past Surgical History  Procedure Laterality Date  . Quadruple bypass    . Knee surgery    . Bunionectomy      Current Outpatient Rx  Name  Route  Sig  Dispense  Refill  . albuterol (PROVENTIL HFA;VENTOLIN HFA) 108 (90 BASE) MCG/ACT inhaler   Inhalation   Inhale 2 puffs into the lungs every 6 (six) hours as needed for wheezing or shortness of breath.   1 Inhaler   0   . aspirin EC 325 MG tablet   Oral   Take 325 mg by mouth daily.         Marland Kitchen diltiazem (CARDIZEM CD) 300 MG 24 hr capsule   Oral   Take 1 capsule (300 mg total) by mouth daily.   30 capsule   5   . fluticasone (FLONASE) 50 MCG/ACT nasal spray   Each Nare   Place 1 spray into both nostrils daily.         . Fluticasone-Salmeterol (ADVAIR DISKUS) 250-50 MCG/DOSE AEPB   Inhalation   Inhale 1 puff into the lungs 2 (two) times daily.         . furosemide (LASIX) 40 MG tablet   Oral   Take 1 tablet (40 mg total) by mouth 2 (two) times daily.   60 tablet   5   . glimepiride (AMARYL) 4 MG tablet   Oral   Take 4 mg by mouth daily.         Marland Kitchen  insulin aspart (NOVOLOG) 100 UNIT/ML injection   Subcutaneous   Inject 40 Units into the skin daily.          . metFORMIN (GLUCOPHAGE) 1000 MG tablet   Oral   Take 1,000 mg by mouth 2 (two) times daily with a meal.         . methylPREDNISolone (MEDROL DOSEPAK) 4 MG TBPK tablet      Take as directed   21 tablet   0   . metoprolol tartrate (LOPRESSOR) 25 MG tablet   Oral   Take 1 tablet (25 mg total) by mouth 2 (two) times daily.   60 tablet   5   . nitroGLYCERIN (NITROSTAT) 0.4 MG SL tablet   Sublingual   Place 0.4 mg under the tongue every 5 (five) minutes as needed for chest pain.         . pantoprazole (PROTONIX) 40 MG tablet   Oral   Take 40 mg by mouth daily.         . pramipexole (MIRAPEX) 0.5 MG tablet   Oral   Take 0.5 mg by mouth at bedtime.         . pregabalin (LYRICA) 75 MG capsule   Oral   Take 75 mg by mouth 2 (two)  times daily.         . simvastatin (ZOCOR) 40 MG tablet   Oral   Take 40 mg by mouth daily.         . traZODone (DESYREL) 100 MG tablet   Oral   Take 100 mg by mouth at bedtime.         Alveda Reasons 15 MG TABS tablet   Oral   Take 1 tablet by mouth daily.           Dispense as written.     Allergies Review of patient's allergies indicates no known allergies.  Family History  Problem Relation Age of Onset  . Stroke Mother   . Heart attack Mother   . Hypertension Mother   . Heart attack Father   . Hypertension Father   . Heart attack Brother     #1  . Diabetes Brother     #1  . Heart disease Brother     #2  . Lung disease Brother     #2  . Hypertension Brother     #2  . Diabetes Brother     #2    Social History Social History  Substance Use Topics  . Smoking status: Never Smoker   . Smokeless tobacco: None  . Alcohol Use: No    Review of Systems  Constitutional: Negative for fever. Eyes: Negative for visual changes. ENT: Negative for sore throat. Cardiovascular: Negative for chest pain. Respiratory: Negative for shortness of breath. Gastrointestinal: Negative for abdominal pain, vomiting and diarrhea. Genitourinary: Negative for dysuria. Musculoskeletal: Positive for back pain. Skin: Negative for rash. Neurological: Negative for headache. 10 point Review of Systems otherwise negative ____________________________________________   PHYSICAL EXAM:  VITAL SIGNS: ED Triage Vitals  Enc Vitals Group     BP 05/04/15 1646 166/79 mmHg     Pulse Rate 05/04/15 1646 98     Resp 05/04/15 1646 18     Temp 05/04/15 1646 99 F (37.2 C)     Temp Source 05/04/15 1646 Oral     SpO2 05/04/15 1646 95 %     Weight 05/04/15 1646 294 lb (133.358 kg)     Height 05/04/15 1646 5\' 8"  (  1.727 m)     Head Cir --      Peak Flow --      Pain Score 05/04/15 1646 10     Pain Loc --      Pain Edu? --      Excl. in [place]? --      Constitutional: Alert and oriented.  Well appearing and in no distress. Eyes: Conjunctivae are normal. PERRL. Normal extraocular movements. ENT   Head: Normocephalic and atraumatic.   Nose: No congestion/rhinnorhea.   Mouth/Throat: Mucous membranes are moist.Flushed cheeks   Neck: No stridor. Cardiovascular/Chest: Normal rate, regular rhythm.  No murmurs, rubs, or gallops. Respiratory: Normal respiratory effort without tachypnea nor retractions. Breath sounds are clear and equal bilaterally. No wheezes/rales/rhonchi. Gastrointestinal: Soft. No distention, no guarding, no rebound. Nontender. Obese.   Genitourinary/rectal:Deferred Musculoskeletal3+ lower extremity pitting edema bilateral lower extremity. Tenderness in the mid buttock to palpation. Pain with straight leg raise on the right. Right great toe significant only swollen and red with area of necrotic skin/ulcer on the sole surface of the right great toe. Redness and swelling extending up the foot and to the ankle. Neurologic:  Normal speech and language. No gross or focal neurologic deficits are appreciated. Skin:  Skin is warm, dry. Psychiatric: Mood and affect are normal. Speech and behavior are normal. Patient exhibits appropriate insight and judgment.  ____________________________________________   EKG I, Lisa Roca, MD, the attending physician have personally viewed and interpreted all ECGs.  No EKG performed__  LABS (pertinent positives/negatives)  Up with the count 14.8, hemoglobin 11.3 and platelet count 203 Comprehensive metabolic panel without significant abnormality   ____________________________________________  RADIOLOGY All Xrays were viewed by me. Imaging interpreted by Radiologist.  Doristine Devoid toe right:   IMPRESSION: 1. Minimally displaced fracture, tuft distal phalanx right great Toe. _______________________________________________  PROCEDURES  Procedure(s) performed: None  Critical Care performed:  None  ____________________________________________   ED COURSE / ASSESSMENT AND PLAN  CONSULTATIONS: Hospitalist for admission   Pertinent labs & imaging results that were available during my care of the patient were reviewed by me and considered in my medical decision making (see chart for details).  The patient's initial complaint for which she came to the ED seems to be sciatica clinically. However he has a cellulitis due to what appears to be a diabetic foot ulcer that has an area of necrosis, and associated with this is elevated white blood cell count. This patient needs IV antibiotics, and podiatry evaluation for possible debridement. Patient will be admitted for treatment of the cellulitis/toe infection.  IV vancomycin and Zosyn were given for diabetic foot ulcer infection's coverage.  Patient / Family / Caregiver informed of clinical course, medical decision-making process, and agree with plan.   ___________________________________________   FINAL CLINICAL IMPRESSION(S) / ED DIAGNOSES   Final diagnoses:  Sciatica, right  Diabetic ulcer of right foot associated with type 2 diabetes mellitus ([place])  Cellulitis of toe, right       Lisa Roca, MD 05/04/15 [phone]

## 2015-05-04 NOTE — ED Notes (Signed)
Pt presents with swelling to right lower leg. Pt rates pain as 10/10. Pt reports pain radiates up his hip and back. Pt presents with ulcer to bottom of right great toe. Lower legs reddened in color bilaterally.

## 2015-05-04 NOTE — Telephone Encounter (Signed)
Patient did not show for his appointment at the [facility] on 05/04/15. This is the 2nd appointment that he's not shown up for. Will attempt to reschedule.

## 2015-05-04 NOTE — Progress Notes (Signed)
ANTIBIOTIC CONSULT NOTE - INITIAL  Pharmacy Consult for Vancomycin and Zosyn Indication: Diabetic Foot Infection  No Known Allergies  Patient Measurements: Height: 5\' 8"  (172.7 cm) Weight: 294 lb (133.358 kg) IBW/kg (Calculated) : 68.4 Adjusted Body Weight: 94.4kg (Pt's TBW is 95% different from IBW)  Vital Signs: Temp: 99 F (37.2 C) (11/18 1646) Temp Source: Oral (11/18 1646) BP: 156/70 mmHg (11/18 1830) Pulse Rate: 88 (11/18 1830) Intake/Output from previous day:   Intake/Output from this shift:    Labs:  Recent Labs  05/04/15 1653  WBC 14.8*  HGB 11.3*  PLT 203  CREATININE 1.10   Estimated Creatinine Clearance: 96.5 mL/min (by C-G formula based on Cr of 1.1). No results for input(s): VANCOTROUGH, VANCOPEAK, VANCORANDOM, GENTTROUGH, GENTPEAK, GENTRANDOM, TOBRATROUGH, TOBRAPEAK, TOBRARND, AMIKACINPEAK, AMIKACINTROU, AMIKACIN in the last 72 hours.   Microbiology: No results found for this or any previous visit (from the past 720 hour(s)).  Medical History: Past Medical History  Diagnosis Date  . MI (myocardial infarction) ([place])   . Sepsis ([place])   . History of cardioversion   . Diabetes mellitus without complication ([place])   . Hypertension   . CAD (coronary artery disease)   . SVT (supraventricular tachycardia) ([place])   . OSA on CPAP   . A-fib ([place])   . Chronic systolic CHF (congestive heart failure) ([facility])     Medications:  Scheduled:  . aspirin EC  325 mg Oral Daily  . diltiazem  300 mg Oral Daily  . enoxaparin (LOVENOX) injection  1 mg/kg Subcutaneous Q12H  . fluticasone  1 spray Each Nare Daily  . furosemide  40 mg Oral BID  . glimepiride  4 mg Oral Daily  . [START ON 05/05/2015] insulin aspart  0-9 Units Subcutaneous TID WC  . insulin NPH Human  30 Units Subcutaneous BID  . [START ON 05/05/2015] metFORMIN  1,000 mg Oral BID WC  . metoprolol tartrate  25 mg Oral BID  . mometasone-formoterol  2 puff Inhalation BID  . pantoprazole  40 mg Oral Daily   . pramipexole  0.5 mg Oral QHS  . pregabalin  75 mg Oral BID  . simvastatin  40 mg Oral QHS  . traZODone  100 mg Oral QHS  . vancomycin  1,250 mg Intravenous Q8H   Assessment: Marcus Williams is a 59yo male admitted for a diabetic foot infection. Pharmacy is consulted to dose Vancomycin and Zosyn in this patient for the same.  Goal of Therapy:  Vancomycin trough level 15-20 mcg/ml  Plan:  Measure antibiotic drug levels at steady state Follow up culture results   Initiate Zosyn 4.5gm EIV q8hrs, due to patient's larger size (BMI 45).  Initiate Vancomycin 1250mg  IV q8hrs starting 6 hours after the first dose.  Ke= 0.085 T1/2= 8.15 Vd= 93.38L  Expected Cmax= 23.65 Expected Cmin= 13.04  Due to patient's larger size, will expect some accumulation to make him therapeutic.  Check vanc trough prior to 5th dose at 2300 on 11/19.  Pharmacy will continue to monitor for changes in renal function or culture results.  Roe Coombs, PharmD Pharmacy Resident 05/04/2015

## 2015-05-04 NOTE — Progress Notes (Signed)
ANTICOAGULATION CONSULT NOTE - Initial Consult  Pharmacy Consult for Enoxaparin Indication: atrial fibrillation  No Known Allergies  Patient Measurements: Height: 5\' 8"  (172.7 cm) Weight: 280 lb 12.8 oz (127.37 kg) IBW/kg (Calculated) : 68.4 Heparin Dosing Weight: 99.9kg  Vital Signs: Temp: 98.3 F (36.8 C) (11/18 2002) Temp Source: Oral (11/18 2002) BP: 155/80 mmHg (11/18 2004) Pulse Rate: 84 (11/18 2004)  Labs:  Recent Labs  05/04/15 1653  HGB 11.3*  HCT 36.1*  PLT 203  CREATININE 1.10    Estimated Creatinine Clearance: 94.1 mL/min (by C-G formula based on Cr of 1.1).   Medical History: Past Medical History  Diagnosis Date  . MI (myocardial infarction) ([place])   . Sepsis ([place])   . History of cardioversion   . Diabetes mellitus without complication ([place])   . Hypertension   . CAD (coronary artery disease)   . SVT (supraventricular tachycardia) ([place])   . OSA on CPAP   . A-fib ([place])   . Chronic systolic CHF (congestive heart failure) ([facility])     Medications:  Scheduled:  . aspirin EC  325 mg Oral Daily  . diltiazem  300 mg Oral Daily  . enoxaparin (LOVENOX) injection  1 mg/kg Subcutaneous Q12H  . fluticasone  1 spray Each Nare Daily  . furosemide  40 mg Oral BID  . glimepiride  4 mg Oral Daily  . [START ON 05/05/2015] insulin aspart  0-9 Units Subcutaneous TID WC  . insulin NPH Human  30 Units Subcutaneous BID  . [START ON 05/05/2015] metFORMIN  1,000 mg Oral BID WC  . metoprolol tartrate  25 mg Oral BID  . mometasone-formoterol  2 puff Inhalation BID  . pantoprazole  40 mg Oral Daily  . pramipexole  0.5 mg Oral QHS  . pregabalin  75 mg Oral BID  . simvastatin  40 mg Oral QHS  . traZODone  100 mg Oral QHS  . vancomycin  1,250 mg Intravenous Q8H    Assessment: Marcus Williams is a 59yo male admitted for diabetic foot infection. Pharmacy consulted to dose enoxaparin for Afib due to potential for upcoming debridement. Patient is on rivaroxaban at home.   Goal of  Therapy:  Monitor platelets by anticoagulation protocol: Yes   Plan:  Initiate lovenox 135mg  SQ q12hrs.   Initiate dose today at 1800, which will be ~24 hours after last home rivaroxaban dose.  Pharmacy will continue to monitor.  Roe Coombs, PharmD Pharmacy Resident 05/04/2015

## 2015-05-04 NOTE — ED Notes (Signed)
Pt c/o having chills and sweats today.. States he has been having pain and swelling to the right leg with redness noted

## 2015-05-04 NOTE — H&P (Signed)
[facility] [place] NAME: Marcus Williams    MR#:  [id number]  DATE OF BIRTH:  Feb 07, 1956  DATE OF ADMISSION:  05/04/2015  PRIMARY CARE PHYSICIAN: Idelle Crouch, MD   REQUESTING/REFERRING PHYSICIAN: Lisa Roca  CHIEF COMPLAINT:   Chief Complaint  Patient presents with  . Leg Swelling  . Chills    HISTORY OF PRESENT ILLNESS: Marcus Williams  is a 59 y.o. 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 fib, chronic systolic CHF- was started having some swelling on his leg mainly on the right side he came to emergency room 3 days ago when Doppler studies were done on both lower extremities which was negative for any blood clots and so he had a tight elastic bandages wrapped around his right leg and he was sent home. He said that his right big toe started hurting today and so he just to remove the bandage and after removing the bandage he started severe pain in his leg thigh and going all the way to the right lower back . Concerned with this he came to emergency room.  He was noted to have infected right great toe x-ray does not show any bony involvement but there is a small fracture of his phalanx. And as he did not had any history of fall or injuries to his back suspected to have sciatica. Given to hospitalist team for further management. Patient said he had some chills today earlier at home.  PAST MEDICAL HISTORY:   Past Medical History  Diagnosis Date  . MI (myocardial infarction) ([place])   . Sepsis ([place])   . History of cardioversion   . Diabetes mellitus without complication ([place])   . Hypertension   . CAD (coronary artery disease)   . SVT (supraventricular tachycardia) ([place])   . OSA on CPAP   . A-fib ([place])   . Chronic systolic CHF (congestive heart failure) ([place])     PAST SURGICAL HISTORY:  Past Surgical History   Procedure Laterality Date  . Quadruple bypass    . Knee surgery    . Bunionectomy    . Coronary artery bypass graft      SOCIAL HISTORY:  Social History  Substance Use Topics  . Smoking status: Never Smoker   . Smokeless tobacco: Not on file  . Alcohol Use: No    FAMILY HISTORY:  Family History  Problem Relation Age of Onset  . Stroke Mother   . Heart attack Mother   . Hypertension Mother   . Heart attack Father   . Hypertension Father   . Heart attack Brother     #1  . Diabetes Brother     #1  . Heart disease Brother     #2  . Lung disease Brother     #2  . Hypertension Brother     #2  . Diabetes Brother     #2    DRUG ALLERGIES: No Known Allergies  REVIEW OF SYSTEMS:   CONSTITUTIONAL: No fever,  positive for fatigue or weakness.  EYES: No blurred or double vision.  EARS, NOSE, AND THROAT: No tinnitus or ear pain.  RESPIRATORY: No cough, shortness of breath, wheezing or hemoptysis.  CARDIOVASCULAR: No chest pain, orthopnea, edema.  GASTROINTESTINAL: No nausea, vomiting, diarrhea or abdominal pain.  GENITOURINARY: No dysuria, hematuria.  ENDOCRINE: No polyuria, nocturia,  HEMATOLOGY: No anemia, easy bruising or  bleeding SKIN: No rash or lesion. MUSCULOSKELETAL:Right foot great toe  joint pain or arthritis. He also have pain radiating from his right lower back all the way to his thigh and leg.  NEUROLOGIC: No tingling, numbness, weakness.  PSYCHIATRY: No anxiety or depression.   MEDICATIONS AT HOME:  Prior to Admission medications   Medication Sig Start Date End Date Taking? Authorizing Provider  albuterol (PROVENTIL HFA;VENTOLIN HFA) 108 (90 BASE) MCG/ACT inhaler Inhale 2 puffs into the lungs every 6 (six) hours as needed for wheezing or shortness of breath. 12/10/14  Yes Lisa Roca, MD  ALPRAZolam Duanne Moron) 0.25 MG tablet Take 0.25 mg by mouth 2 (two) times daily as needed for anxiety.   Yes Historical Provider, MD  aspirin EC 325 MG tablet Take 325 mg by  mouth daily.   Yes Historical Provider, MD  diltiazem (CARDIZEM CD) 300 MG 24 hr capsule Take 1 capsule (300 mg total) by mouth daily. 10/15/14  Yes Idelle Crouch, MD  fluticasone (FLONASE) 50 MCG/ACT nasal spray Place 1 spray into both nostrils daily.   Yes Historical Provider, MD  Fluticasone-Salmeterol (ADVAIR DISKUS) 250-50 MCG/DOSE AEPB Inhale 1 puff into the lungs 2 (two) times daily.   Yes Historical Provider, MD  furosemide (LASIX) 40 MG tablet Take 1 tablet (40 mg total) by mouth 2 (two) times daily. 10/15/14  Yes Idelle Crouch, MD  glimepiride (AMARYL) 4 MG tablet Take 4 mg by mouth daily.   Yes Historical Provider, MD  insulin NPH Human (HUMULIN N,NOVOLIN N) 100 UNIT/ML injection Inject 40 Units into the skin 2 (two) times daily.   Yes Historical Provider, MD  ipratropium-albuterol (DUONEB) 0.5-2.5 (3) MG/3ML SOLN Take 3 mLs by nebulization every 4 (four) hours as needed (for shortness of breath).   Yes Historical Provider, MD  metFORMIN (GLUCOPHAGE) 1000 MG tablet Take 1,000 mg by mouth 2 (two) times daily with a meal.   Yes Historical Provider, MD  metoprolol tartrate (LOPRESSOR) 25 MG tablet Take 1 tablet (25 mg total) by mouth 2 (two) times daily. 10/15/14  Yes Idelle Crouch, MD  nitroGLYCERIN (NITROSTAT) 0.4 MG SL tablet Place 0.4 mg under the tongue every 5 (five) minutes as needed for chest pain.   Yes Historical Provider, MD  pantoprazole (PROTONIX) 40 MG tablet Take 40 mg by mouth daily.   Yes Historical Provider, MD  pramipexole (MIRAPEX) 0.5 MG tablet Take 0.5 mg by mouth at bedtime.   Yes Historical Provider, MD  pregabalin (LYRICA) 75 MG capsule Take 75 mg by mouth 2 (two) times daily.   Yes Historical Provider, MD  simvastatin (ZOCOR) 40 MG tablet Take 40 mg by mouth at bedtime.    Yes Historical Provider, MD  traZODone (DESYREL) 100 MG tablet Take 100 mg by mouth at bedtime.   Yes Historical Provider, MD  XARELTO 15 MG TABS tablet Take 15 mg by mouth every evening.    Yes  Historical Provider, MD  methylPREDNISolone (MEDROL DOSEPAK) 4 MG TBPK tablet Take as directed Patient not taking: Reported on 05/04/2015 03/26/15   Paulette Blanch, MD      PHYSICAL EXAMINATION:   VITAL SIGNS: Blood pressure 156/70, pulse 88, temperature 99 F (37.2 C), temperature source Oral, resp. rate 18, height 5\' 8"  (1.727 m), weight 133.358 kg (294 lb), SpO2 95 %.  GENERAL:  59 y.o.-year-old obase patient lying in the bed with no acute distress.  EYES: Pupils equal, round, reactive to light and accommodation. No scleral icterus. Extraocular muscles intact.  HEENT: Head atraumatic, normocephalic. Oropharynx and nasopharynx clear.  NECK:  Supple, no jugular venous distention. No thyroid enlargement, no tenderness.  LUNGS: Normal breath sounds bilaterally, no wheezing, rales,rhonchi or crepitation. No use of accessory muscles of respiration.  CARDIOVASCULAR: S1, S2 normal. No murmurs, rubs, or gallops.  ABDOMEN: Soft, nontender, nondistended. Bowel sounds present. No organomegaly or mass.  EXTREMITIES: No pedal edema, cyanosis, or clubbing on left leg. On the right leg swelling mild redness and warm to touch, right foot also appears little bit swollen compared to the left, in the right great toe is swollen and red, and at the lower and inner aspect of that there is some thickening in the skin with black spot and yellowish area surrounding that most likely the fluid collection underneath. On pressure to his right side sacroiliac joint there is no severe tenderness.  NEUROLOGIC: Cranial nerves II through XII are intact. Muscle strength 5/5 in all extremities. Sensation intact. Gait not checked. He is able to move his right lower extremity but it causes him severe pain in his right lower back.  PSYCHIATRIC: The patient is alert and oriented x 3.  SKIN: No obvious rash, lesion, or ulcer.   LABORATORY PANEL:   CBC  Recent Labs Lab 05/04/15 1653  WBC 14.8*  HGB 11.3*  HCT 36.1*  PLT 203   MCV 75.3*  MCH 23.6*  MCHC 31.3*  RDW 17.4*   ------------------------------------------------------------------------------------------------------------------  Chemistries   Recent Labs Lab 05/04/15 1653  NA 138  K 3.7  CL 100*  CO2 28  GLUCOSE 147*  BUN 19  CREATININE 1.10  CALCIUM 9.3  AST 26  ALT 27  ALKPHOS 66  BILITOT 0.6   ------------------------------------------------------------------------------------------------------------------ estimated creatinine clearance is 96.5 mL/min (by C-G formula based on Cr of 1.1). ------------------------------------------------------------------------------------------------------------------ No results for input(s): TSH, T4TOTAL, T3FREE, THYROIDAB in the last 72 hours.  Invalid input(s): FREET3   Coagulation profile No results for input(s): INR, PROTIME in the last 168 hours. ------------------------------------------------------------------------------------------------------------------- No results for input(s): DDIMER in the last 72 hours. -------------------------------------------------------------------------------------------------------------------  Cardiac Enzymes No results for input(s): CKMB, TROPONINI, MYOGLOBIN in the last 168 hours.  Invalid input(s): CK ------------------------------------------------------------------------------------------------------------------ Invalid input(s): POCBNP  ---------------------------------------------------------------------------------------------------------------  Urinalysis    Component Value Date/Time   COLORURINE YELLOW* 05/04/2015 1653   COLORURINE Yellow 07/21/2014 1803   APPEARANCEUR CLEAR* 05/04/2015 1653   APPEARANCEUR Clear 07/21/2014 1803   LABSPEC 1.017 05/04/2015 1653   LABSPEC 1.011 07/21/2014 1803   PHURINE 5.0 05/04/2015 1653   PHURINE 7.0 07/21/2014 1803   GLUCOSEU NEGATIVE 05/04/2015 1653   GLUCOSEU >=500 07/21/2014 1803   HGBUR 1+*  05/04/2015 1653   HGBUR 1+ 07/21/2014 1803   BILIRUBINUR NEGATIVE 05/04/2015 1653   BILIRUBINUR Negative 07/21/2014 [place] 05/04/2015 1653   KETONESUR 1+ 07/21/2014 1803   PROTEINUR 30* 05/04/2015 1653   PROTEINUR 30 mg/dL 07/21/2014 1803   UROBILINOGEN 0.2 07/28/2014 1449   NITRITE NEGATIVE 05/04/2015 1653   NITRITE Negative 07/21/2014 1803   LEUKOCYTESUR NEGATIVE 05/04/2015 1653   LEUKOCYTESUR Negative 07/21/2014 1803     RADIOLOGY: Dg Toe Great Right  05/04/2015  CLINICAL DATA:  Diabetic wound, great toe.  Pain, swelling, redness. EXAM: RIGHT GREAT TOE COMPARISON:  03/03/2008 FINDINGS: Fracture across the tuft of the distal phalanx, distracted 1 mm. Narrowing of the articular cartilage in the interphalangeal joint of the great toe. Small marginal spurs at the base of the proximal phalanx and from the first metatarsal head. Vascular calcifications at the  metatarsal level. No subcutaneous gas or radiodense foreign body is identified. IMPRESSION: 1. Minimally displaced fracture, tuft distal phalanx right great toe. Electronically Signed   By: Lucrezia Europe M.D.   On: 05/04/2015 17:55    IMPRESSION AND PLAN:  * Right great toe wounds and leg cellulitis  Will give IV vancomycin and Zosyn. Podiatry consult as he might need some debridement on his toe.  Data studies are negative done 2 days ago in ER.  * Right lower back pain with radiation up to the leg  Most likely this sciatica  We'll get a CT scan of the lumbar spine to check for any abnormalities.  * Diabetes  Continue his oral medications but we will decrease slightly on his NPH doses and will keep him on sliding scale coverage around-the-clock.  * Atrial fibrillation  Radiating under control currently continue metoprolol, Cardizem. Hold her Xarelto as he might need some [place] by podiatry and I will put him on Lovenox therapeutic dose so he can easily go for the surgery if required.  * Coronary artery  disease  Continue aspirin, he will be on full dose Lovenox, continue metoprolol and statin.  * COPD  No active wheezing continue home inhalers and nebulizers.   All the records are reviewed and case discussed with ED provider. Management plans discussed with the patient, family and they are in agreement.  CODE STATUS:Full code   TOTAL TIME TAKING CARE OF THIS PATIENT: 50 minutes.    Vaughan Basta M.D on 05/04/2015   Between 7am to 6pm - Pager - [phone]  After 6pm go to [email or web address] - password [email or web address] [facility]  Office  [phone]  CC: Primary care physician; Idelle Crouch, MD   Note: This dictation was prepared with Dragon dictation along with smaller phrase technology. Any transcriptional errors that result from this process are unintentional.

## 2015-05-05 ENCOUNTER — Inpatient Hospital Stay: Payer: Commercial Managed Care - HMO

## 2015-05-05 LAB — CBC
HEMATOCRIT: 31.8 % — AB (ref 40.0–52.0)
Hemoglobin: 10 g/dL — ABNORMAL LOW (ref 13.0–18.0)
MCH: 23.7 pg — AB (ref 26.0–34.0)
MCHC: 31.3 g/dL — AB (ref 32.0–36.0)
MCV: 75.8 fL — AB (ref 80.0–100.0)
PLATELETS: 179 10*3/uL (ref 150–440)
RBC: 4.19 MIL/uL — ABNORMAL LOW (ref 4.40–5.90)
RDW: 17.1 % — AB (ref 11.5–14.5)
WBC: 11.5 10*3/uL — ABNORMAL HIGH (ref 3.8–10.6)

## 2015-05-05 LAB — BASIC METABOLIC PANEL
Anion gap: 9 (ref 5–15)
BUN: 19 mg/dL (ref 6–20)
CHLORIDE: 101 mmol/L (ref 101–111)
CO2: 27 mmol/L (ref 22–32)
CREATININE: 1.15 mg/dL (ref 0.61–1.24)
Calcium: 8.4 mg/dL — ABNORMAL LOW (ref 8.9–10.3)
GFR calc Af Amer: >60 mL/min (ref >60)
GFR calc non Af Amer: >60 mL/min (ref >60)
GLUCOSE: 160 mg/dL — AB (ref 65–99)
POTASSIUM: 3.4 mmol/L — AB (ref 3.5–5.1)
Sodium: 137 mmol/L (ref 135–145)

## 2015-05-05 LAB — SURGICAL PCR SCREEN
MRSA, PCR: NEGATIVE
Staphylococcus aureus: POSITIVE — AB

## 2015-05-05 LAB — GLUCOSE, CAPILLARY
Glucose-Capillary: 155 mg/dL — ABNORMAL HIGH (ref 65–99)
Glucose-Capillary: 205 mg/dL — ABNORMAL HIGH (ref 65–99)
Glucose-Capillary: 185 mg/dL — ABNORMAL HIGH (ref 65–99)
Glucose-Capillary: 152 mg/dL — ABNORMAL HIGH (ref 65–99)

## 2015-05-05 MED ORDER — ENOXAPARIN SODIUM 150 MG/ML ~~LOC~~ SOLN
1.0000 mg/kg | Freq: Two times a day (BID) | SUBCUTANEOUS | Status: DC
  Administered 2015-05-05 – 2015-05-08 (×6): 130 mg via SUBCUTANEOUS
  Filled 2015-05-05 (×8): qty 0.85

## 2015-05-05 MED ORDER — ACETAMINOPHEN 325 MG PO TABS: 650.0000 mg | ORAL_TABLET | Freq: Four times a day (QID) | ORAL | Status: DC | PRN

## 2015-05-05 MED ORDER — ACETAMINOPHEN 325 MG PO TABS
650.0000 mg | ORAL_TABLET | Freq: Four times a day (QID) | ORAL | Status: DC | PRN
  Administered 2015-05-05 (×2): 650 mg via ORAL
  Filled 2015-05-05 (×2): qty 2

## 2015-05-05 MED ORDER — CYCLOBENZAPRINE HCL 10 MG PO TABS
5.0000 mg | ORAL_TABLET | Freq: Three times a day (TID) | ORAL | Status: DC | PRN
  Administered 2015-05-07: 5 mg via ORAL
  Filled 2015-05-05: qty 1

## 2015-05-05 MED ORDER — LORAZEPAM 2 MG/ML IJ SOLN
2.0000 mg | Freq: Once | INTRAMUSCULAR | Status: AC
Start: 2015-05-05 — End: 2015-05-06
  Administered 2015-05-06: 2 mg via INTRAVENOUS
  Filled 2015-05-05: qty 1

## 2015-05-05 NOTE — Consult Note (Signed)
Reason for Consult: Cellulitis with ulceration right great toe Referring Physician: [facility]  Marcus Williams is an 59 y.o. male.  HPI: The patient relates a 2 to three-week history of some swelling and redness in his right great toe. Has had some drainage from an ulceration on the bottom of his great toe. He denies any specific history of recent injury. States that some time back a trailer did drop on his great toe. Recently also started to have some significant swelling and redness in his right leg. Presented to the emergency room where he was admitted for further work-up.  Past Medical History  Diagnosis Date  . MI (myocardial infarction) ([place])   . Sepsis ([place])   . History of cardioversion   . Diabetes mellitus without complication ([place])   . Hypertension   . CAD (coronary artery disease)   . SVT (supraventricular tachycardia) ([place])   . OSA on CPAP   . A-fib ([place])   . Chronic systolic CHF (congestive heart failure) [facility])     Past Surgical History  Procedure Laterality Date  . Quadruple bypass    . Knee surgery    . Bunionectomy    . Coronary artery bypass graft      Family History  Problem Relation Age of Onset  . Stroke Mother   . Heart attack Mother   . Hypertension Mother   . Heart attack Father   . Hypertension Father   . Heart attack Brother     #1  . Diabetes Brother     #1  . Heart disease Brother     #2  . Lung disease Brother     #2  . Hypertension Brother     #2  . Diabetes Brother     #2    Social History:  reports that he has never smoked. He does not have any smokeless tobacco history on file. He reports that he does not drink alcohol or use illicit drugs.  Allergies: No Known Allergies  Medications: I have reviewed the patient's current medications.  Results for orders placed or performed during the hospital encounter of 05/04/15 (from the past 48 hour(s))  Comprehensive metabolic panel     Status: Abnormal   Collection Time:  05/04/15  4:53 PM  Result Value Ref Range   Sodium 138 135 - 145 mmol/L   Potassium 3.7 3.5 - 5.1 mmol/L   Chloride 100 (L) 101 - 111 mmol/L   CO2 28 22 - 32 mmol/L   Glucose, Bld 147 (H) 65 - 99 mg/dL   BUN 19 6 - 20 mg/dL   Creatinine, Ser 1.10 0.61 - 1.24 mg/dL   Calcium 9.3 8.9 - 10.3 mg/dL   Total Protein 8.0 6.5 - 8.1 g/dL   Albumin 3.9 3.5 - 5.0 g/dL   AST 26 15 - 41 U/L   ALT 27 17 - 63 U/L   Alkaline Phosphatase 66 38 - 126 U/L   Total Bilirubin 0.6 0.3 - 1.2 mg/dL   GFR calc non Af Amer >60 >60 mL/min   GFR calc Af Amer >60 >60 mL/min    Comment: (NOTE) The eGFR has been calculated using the CKD EPI equation. This calculation has not been validated in all clinical situations. eGFR's persistently <60 mL/min signify possible Chronic Kidney Disease.    Anion gap 10 5 - 15  CBC     Status: Abnormal   Collection Time: 05/04/15  4:53 PM  Result Value Ref Range  WBC 14.8 (H) 3.8 - 10.6 K/uL   RBC 4.80 4.40 - 5.90 MIL/uL   Hemoglobin 11.3 (L) 13.0 - 18.0 g/dL   HCT 36.1 (L) 40.0 - 52.0 %   MCV 75.3 (L) 80.0 - 100.0 fL   MCH 23.6 (L) 26.0 - 34.0 pg   MCHC 31.3 (L) 32.0 - 36.0 g/dL   RDW 17.4 (H) 11.5 - 14.5 %   Platelets 203 150 - 440 K/uL  Urinalysis complete, with microscopic ([facility] only)     Status: Abnormal   Collection Time: 05/04/15  4:53 PM  Result Value Ref Range   Color, Urine YELLOW (A) YELLOW   APPearance CLEAR (A) CLEAR   Glucose, UA NEGATIVE NEGATIVE mg/dL   Bilirubin Urine NEGATIVE NEGATIVE   Ketones, ur NEGATIVE NEGATIVE mg/dL   Specific Gravity, Urine 1.017 1.005 - 1.030   Hgb urine dipstick 1+ (A) NEGATIVE   pH 5.0 5.0 - 8.0   Protein, ur 30 (A) NEGATIVE mg/dL   Nitrite NEGATIVE NEGATIVE   Leukocytes, UA NEGATIVE NEGATIVE   RBC / HPF 0-5 0 - 5 RBC/hpf   WBC, UA 0-5 0 - 5 WBC/hpf   Bacteria, UA NONE SEEN NONE SEEN   Squamous Epithelial / LPF 0-5 (A) NONE SEEN   Mucous PRESENT    Hyaline Casts, UA PRESENT   Glucose, capillary     Status:  Abnormal   Collection Time: 05/04/15  8:33 PM  Result Value Ref Range   Glucose-Capillary 128 (H) 65 - 99 mg/dL  Basic metabolic panel     Status: Abnormal   Collection Time: 05/05/15  4:25 AM  Result Value Ref Range   Sodium 137 135 - 145 mmol/L   Potassium 3.4 (L) 3.5 - 5.1 mmol/L   Chloride 101 101 - 111 mmol/L   CO2 27 22 - 32 mmol/L   Glucose, Bld 160 (H) 65 - 99 mg/dL   BUN 19 6 - 20 mg/dL   Creatinine, Ser 1.15 0.61 - 1.24 mg/dL   Calcium 8.4 (L) 8.9 - 10.3 mg/dL   GFR calc non Af Amer >60 >60 mL/min   GFR calc Af Amer >60 >60 mL/min    Comment: (NOTE) The eGFR has been calculated using the CKD EPI equation. This calculation has not been validated in all clinical situations. eGFR's persistently <60 mL/min signify possible Chronic Kidney Disease.    Anion gap 9 5 - 15  CBC     Status: Abnormal   Collection Time: 05/05/15  4:25 AM  Result Value Ref Range   WBC 11.5 (H) 3.8 - 10.6 K/uL   RBC 4.19 (L) 4.40 - 5.90 MIL/uL   Hemoglobin 10.0 (L) 13.0 - 18.0 g/dL   HCT 31.8 (L) 40.0 - 52.0 %   MCV 75.8 (L) 80.0 - 100.0 fL   MCH 23.7 (L) 26.0 - 34.0 pg   MCHC 31.3 (L) 32.0 - 36.0 g/dL   RDW 17.1 (H) 11.5 - 14.5 %   Platelets 179 150 - 440 K/uL  Glucose, capillary     Status: Abnormal   Collection Time: 05/05/15  7:18 AM  Result Value Ref Range   Glucose-Capillary 155 (H) 65 - 99 mg/dL  Glucose, capillary     Status: Abnormal   Collection Time: 05/05/15 12:01 PM  Result Value Ref Range   Glucose-Capillary 205 (H) 65 - 99 mg/dL    Ct Lumbar Spine Wo Contrast  05/04/2015  CLINICAL DATA:  Right leg pain EXAM: CT LUMBAR SPINE WITHOUT CONTRAST TECHNIQUE:  Multidetector CT imaging of the lumbar spine was performed without intravenous contrast administration. Multiplanar CT image reconstructions were also generated. COMPARISON:  None. FINDINGS: There are 5 non-rib-bearing vertebral bodies. The alignment of the lumbosacral spine is normal. No evidence of fracture or subluxation.  There are multilevel osteoarthritic changes worse at T12-L1, L4-L5 and L5-S1, with disc space narrowing, mild endplate sclerosis and small osteophyte formation. There is broad-based central disc protrusion and L5-S1, without significant neural foramina narrowing. No evidence of bony neural foramina narrowing at any level. IMPRESSION: No evidence of fracture or subluxation of the lumbosacral spine. Mild multilevel osteoarthritic changes, without evidence of bony neural foramina narrowing. Electronically Signed   By: Fidela Salisbury M.D.   On: 05/04/2015 19:43   US Venous Img Lower Unilateral Right  05/05/2015  CLINICAL DATA:  Right lower extremity pain and swelling for 3 weeks. EXAM: RIGHT LOWER EXTREMITY VENOUS DOPPLER ULTRASOUND TECHNIQUE: Gray-scale sonography with graded compression, as well as color Doppler and duplex ultrasound were performed to evaluate the lower extremity deep venous systems from the level of the common femoral vein and including the common femoral, femoral, profunda femoral, popliteal and calf veins including the posterior tibial, peroneal and gastrocnemius veins when visible. The superficial great saphenous vein was also interrogated. Spectral Doppler was utilized to evaluate flow at rest and with distal augmentation maneuvers in the common femoral, femoral and popliteal veins. COMPARISON:  None. FINDINGS: Contralateral Common Femoral Vein: Respiratory phasicity is normal and symmetric with the symptomatic side. No evidence of thrombus. Normal compressibility. Common Femoral Vein: No evidence of thrombus. Normal compressibility, respiratory phasicity and response to augmentation. Saphenofemoral Junction: No evidence of thrombus. Normal compressibility and flow on color Doppler imaging. Profunda Femoral Vein: No evidence of thrombus. Normal compressibility and flow on color Doppler imaging. Femoral Vein: No evidence of thrombus. Normal compressibility, respiratory phasicity and  response to augmentation. Popliteal Vein: No evidence of thrombus. Normal compressibility, respiratory phasicity and response to augmentation. Calf Veins: No evidence of thrombus. Normal compressibility and flow on color Doppler imaging. Superficial Great Saphenous Vein: No evidence of thrombus. Normal compressibility and flow on color Doppler imaging. Venous Reflux:  None. Other Findings:  None. IMPRESSION: No evidence of deep venous thrombosis. Electronically Signed   By: Lajean Manes M.D.   On: 05/05/2015 13:42   Dg Toe Great Right  05/04/2015  CLINICAL DATA:  Diabetic wound, great toe.  Pain, swelling, redness. EXAM: RIGHT GREAT TOE COMPARISON:  03/03/2008 FINDINGS: Fracture across the tuft of the distal phalanx, distracted 1 mm. Narrowing of the articular cartilage in the interphalangeal joint of the great toe. Small marginal spurs at the base of the proximal phalanx and from the first metatarsal head. Vascular calcifications at the metatarsal level. No subcutaneous gas or radiodense foreign body is identified. IMPRESSION: 1. Minimally displaced fracture, tuft distal phalanx right great toe. Electronically Signed   By: Lucrezia Europe M.D.   On: 05/04/2015 17:55    Review of Systems  Constitutional: Negative.   HENT: Negative.   Eyes: Negative.   Respiratory: Negative.   Cardiovascular: Positive for leg swelling.  Gastrointestinal: Negative.   Genitourinary: Negative.   Musculoskeletal: Negative.   Skin:       Redness and swelling in the right great toe with a draining ulcer on the bottom  Neurological:       Numbness and paresthesias in the feet from his diabetes  Endo/Heme/Allergies: Negative.   Psychiatric/Behavioral: Negative.    Blood pressure 151/55, pulse 81, temperature  98.7 F (37.1 C), temperature source Oral, resp. rate 18, height _0  (1.727 m), weight 127.37 kg (280 lb 12.8 oz), SpO2 93 %. Physical Exam  Cardiovascular:  DP and PT pulses are palpable. Capillary filling time  appears to be intact  Musculoskeletal:  Adequate range of motion of the pedal joints. Muscle testing is deferred  Neurological:  Loss of protective threshold with a monofilament wire. Proprioception is impaired  Skin:  The skin is warm dry and mildly atrophic. Significant edema in the right lower extremity with some hyperpigmentation in the leg. Significant focal edema and erythema in the right great toe. A full-thickness plantar ulceration is noted on the plantar aspect of the hallux approximately 2-3 mm diameter which does probe all the way down towards the bone. Significant purulence is expressed from the wound. A larger more superficial area of ulceration is present approximately 2 cm diameter    Assessment/Plan: Assessment: Full-thickness ulceration with abscess right hallux. Diabetes with associated neuropathy.  Plan: Debrided devitalized tissue full-thickness from the ulceration sharply using tissue nippers. A culture was taken of the abscess for sensitivities. A wet to dry dressing was applied to the right great toe. Discussed with the patient the possibility of bone infection due to the deep abscess and the possible need for debridement or amputation of the great toe. Plain film x-rays do not clearly show bone destruction but did show a fracture at the distal phalanx of the hallux. We will obtain an MRI for evaluation of osteomyelitis. Determination will be made for surgery after his MRI results are obtained  Kailei Cowens W. 05/05/2015, 2:50 PM

## 2015-05-05 NOTE — Progress Notes (Signed)
[place] [facility] [place] NAME: Marcus Williams    MR#:  [id number]  DATE OF BIRTH:  [birth year]  SUBJECTIVE:  CHIEF COMPLAINT:   Chief Complaint  Patient presents with  . Leg Swelling  . Chills    said the pain is still the same.  REVIEW OF SYSTEMS:   CONSTITUTIONAL: No fever, positive for fatigue or weakness.  EYES: No blurred or double vision.  EARS, NOSE, AND THROAT: No tinnitus or ear pain.  RESPIRATORY: No cough, shortness of breath, wheezing or hemoptysis.  CARDIOVASCULAR: No chest pain, orthopnea, edema.  GASTROINTESTINAL: No nausea, vomiting, diarrhea or abdominal pain.  GENITOURINARY: No dysuria, hematuria.  ENDOCRINE: No polyuria, nocturia,  HEMATOLOGY: No anemia, easy bruising or bleeding SKIN: No rash or lesion. MUSCULOSKELETAL:Right foot great toe joint pain or arthritis. He also have pain radiating from his right lower back all the way to his thigh and leg.  NEUROLOGIC: No tingling, numbness, weakness.  PSYCHIATRY: No anxiety or depression.     ROS  DRUG ALLERGIES:  No Known Allergies  VITALS:  Blood pressure 151/55, pulse 81, temperature 98.7 F (37.1 C), temperature source Oral, resp. rate 18, height 5\' 8"  (1.727 m), weight 127.37 kg (280 lb 12.8 oz), SpO2 93 %.  PHYSICAL EXAMINATION:   GENERAL: 59 y.o.-year-old obase patient lying in the bed with no acute distress.  EYES: Pupils equal, round, reactive to light and accommodation. No scleral icterus. Extraocular muscles intact.  HEENT: Head atraumatic, normocephalic. Oropharynx and nasopharynx clear.  NECK: Supple, no jugular venous distention. No thyroid enlargement, no tenderness.  LUNGS: Normal breath sounds bilaterally, no wheezing, rales,rhonchi or crepitation. No use of accessory muscles of respiration.  CARDIOVASCULAR: S1, S2 normal. No murmurs, rubs, or gallops.  ABDOMEN: Soft, nontender, nondistended. Bowel sounds present. No  organomegaly or mass.  EXTREMITIES: No pedal edema, cyanosis, or clubbing on left leg. On the right leg swelling mild redness and warm to touch, right foot also appears little bit swollen compared to the left, in the right great toe is swollen and red, and at the lower and inner aspect of that there is some thickening in the skin with black spot and yellowish area surrounding that most likely the fluid collection underneath. On pressure to his right side sacroiliac joint there is no severe tenderness.  NEUROLOGIC: Cranial nerves II through XII are intact. Muscle strength 5/5 in all extremities. Sensation intact. Gait not checked. He is able to move his right lower extremity but it causes him severe pain in his right lower back.  PSYCHIATRIC: The patient is alert and oriented x 3.  SKIN: No obvious rash, lesion, or ulcer.    Physical Exam LABORATORY PANEL:   CBC  Recent Labs Lab 05/05/15 0425  WBC 11.5*  HGB 10.0*  HCT 31.8*  PLT 179   ------------------------------------------------------------------------------------------------------------------  Chemistries   Recent Labs Lab 05/04/15 1653 05/05/15 0425  NA 138 137  K 3.7 3.4*  CL 100* 101  CO2 28 27  GLUCOSE 147* 160*  BUN 19 19  CREATININE 1.10 1.15  CALCIUM 9.3 8.4*  AST 26  --   ALT 27  --   ALKPHOS 66  --   BILITOT 0.6  --    ------------------------------------------------------------------------------------------------------------------  Cardiac Enzymes No results for input(s): TROPONINI in the last 168 hours. ------------------------------------------------------------------------------------------------------------------  RADIOLOGY:  Ct Lumbar Spine Wo Contrast  05/04/2015  CLINICAL DATA:  Right leg pain EXAM: CT LUMBAR SPINE WITHOUT CONTRAST TECHNIQUE: Multidetector  CT imaging of the lumbar spine was performed without intravenous contrast administration. Multiplanar CT image reconstructions were also  generated. COMPARISON:  None. FINDINGS: There are 5 non-rib-bearing vertebral bodies. The alignment of the lumbosacral spine is normal. No evidence of fracture or subluxation. There are multilevel osteoarthritic changes worse at T12-L1, L4-L5 and L5-S1, with disc space narrowing, mild endplate sclerosis and small osteophyte formation. There is broad-based central disc protrusion and L5-S1, without significant neural foramina narrowing. No evidence of bony neural foramina narrowing at any level. IMPRESSION: No evidence of fracture or subluxation of the lumbosacral spine. Mild multilevel osteoarthritic changes, without evidence of bony neural foramina narrowing. Electronically Signed   By: Fidela Salisbury M.D.   On: 05/04/2015 19:43   Dg Toe Great Right  05/04/2015  CLINICAL DATA:  Diabetic wound, great toe.  Pain, swelling, redness. EXAM: RIGHT GREAT TOE COMPARISON:  03/03/2008 FINDINGS: Fracture across the tuft of the distal phalanx, distracted 1 mm. Narrowing of the articular cartilage in the interphalangeal joint of the great toe. Small marginal spurs at the base of the proximal phalanx and from the first metatarsal head. Vascular calcifications at the metatarsal level. No subcutaneous gas or radiodense foreign body is identified. IMPRESSION: 1. Minimally displaced fracture, tuft distal phalanx right great toe. Electronically Signed   By: Lucrezia Europe M.D.   On: 05/04/2015 17:55    ASSESSMENT AND PLAN:   Principal Problem:   Cellulitis  * Right great toe wounds and leg cellulitis  IV vancomycin and Zosyn. Podiatry consult awaited, might need some debridement on his toe. DVT studies are negative done 2 days ago in ER.   But as hae swelling and pain- will repeat doppler on right side.  * Right lower back pain with radiation up to the leg Most likely this sciatica negative CT scan of the lumbar spine to check for any abnormalities.  * Diabetes Continue his oral medications but   decrease  slightly on his NPH doses and will keep him on sliding scale coverage around-the-clock.   Blood sugar is under control.  * Atrial fibrillation Rate under control currently continue metoprolol, Cardizem.  Hold her Xarelto as he might need some debridement by podiatry and I will put him on Lovenox therapeutic dose so he can easily go for the surgery if required.  * Coronary artery disease Continue aspirin, he will be on full dose Lovenox, continue metoprolol and statin.  * COPD No active wheezing continue home inhalers and nebulizers.    All the records are reviewed and case discussed with Care Management/Social Workerr. Management plans discussed with the patient, family and they are in agreement.  CODE STATUS: full  TOTAL TIME TAKING CARE OF THIS PATIENT: 35 minutes.     POSSIBLE D/C IN 2-3 DAYS, DEPENDING ON CLINICAL CONDITION.   Vaughan Basta M.D on 05/05/2015   Between 7am to 6pm - Pager - [phone]  After 6pm go to [email or web address] - password [email or web address] [facility]  Office  [phone]  CC: Primary care physician; Idelle Crouch, MD  Note: This dictation was prepared with Dragon dictation along with smaller phrase technology. Any transcriptional errors that result from this process are unintentional.

## 2015-05-05 NOTE — Progress Notes (Signed)
Pt has been coughing and feels wheezy. O2 sats 90-93% R/A. Called RT for neb tmt.

## 2015-05-05 NOTE — Progress Notes (Signed)
Patient has temp of 101.1. Called Dr. Anselm Jungling who ordered 650mg  tylenol prn q6 for fever.

## 2015-05-05 NOTE — Progress Notes (Signed)
ANTICOAGULATION CONSULT NOTE - Follow up  Pharmacy Consult for Enoxaparin Indication: atrial fibrillation  No Known Allergies  Patient Measurements: Height: 5\' 8"  (172.7 cm) Weight: 280 lb 12.8 oz (127.37 kg) IBW/kg (Calculated) : 68.4 Heparin Dosing Weight:   Vital Signs: Temp: 98.7 F (37.1 C) (11/19 0752) Temp Source: Oral (11/19 0752) BP: 151/55 mmHg (11/19 0752) Pulse Rate: 81 (11/19 0752)  Labs:  Recent Labs  05/04/15 1653 05/05/15 0425  HGB 11.3* 10.0*  HCT 36.1* 31.8*  PLT 203 179  CREATININE 1.10 1.15    Estimated Creatinine Clearance: 90 mL/min (by C-G formula based on Cr of 1.15).   Medical History: Past Medical History  Diagnosis Date  . MI (myocardial infarction) ([place])   . Sepsis ([place])   . History of cardioversion   . Diabetes mellitus without complication ([place])   . Hypertension   . CAD (coronary artery disease)   . SVT (supraventricular tachycardia) ([place])   . OSA on CPAP   . A-fib ([place])   . Chronic systolic CHF (congestive heart failure) ([facility])     Medications:  Scheduled:  . aspirin EC  325 mg Oral Daily  . diltiazem  300 mg Oral Daily  . enoxaparin (LOVENOX) injection  1 mg/kg Subcutaneous Q12H  . fluticasone  1 spray Each Nare Daily  . furosemide  40 mg Oral BID  . glimepiride  4 mg Oral Q breakfast  . insulin aspart  0-9 Units Subcutaneous TID WC  . insulin detemir  30 Units Subcutaneous BID  . metFORMIN  1,000 mg Oral BID WC  . metoprolol tartrate  25 mg Oral BID  . mometasone-formoterol  2 puff Inhalation BID  . pantoprazole  40 mg Oral QAC breakfast  . piperacillin-tazobactam (ZOSYN)  IV  4.5 g Intravenous 3 times per day  . pramipexole  0.5 mg Oral QHS  . pregabalin  75 mg Oral BID  . simvastatin  40 mg Oral QHS  . traZODone  100 mg Oral QHS  . vancomycin  1,250 mg Intravenous Q8H    Assessment: Marcus Williams is a 59yo male admitted for diabetic foot infection. Pharmacy consulted to dose enoxaparin for Afib due to potential for  upcoming debridement. Patient is on rivaroxaban at home.   Goal of Therapy:  Monitor platelets by anticoagulation protocol: Yes   Plan:  11/18: Initiate lovenox 135mg  SQ q12hrs.  Initiate dose today at 1800, which will be ~24 hours after last home rivaroxaban dose.  11/19: Weight changed. Will transition to Lovenox 1 mg/kg (130 mg) SQ Q12h.  Pharmacy will continue to monitor.  [name] PharmD Clinical Pharmacist 05/05/2015 9:43 AM

## 2015-05-06 ENCOUNTER — Encounter: Payer: Self-pay | Admitting: Anesthesiology

## 2015-05-06 ENCOUNTER — Inpatient Hospital Stay: Payer: Commercial Managed Care - HMO

## 2015-05-06 ENCOUNTER — Inpatient Hospital Stay: Payer: Commercial Managed Care - HMO | Admitting: Anesthesiology

## 2015-05-06 ENCOUNTER — Encounter
Admission: EM | Disposition: A | Discharge status: Home / Self Care | Payer: Self-pay | Source: Home / Self Care | Attending: Internal Medicine

## 2015-05-06 HISTORY — PX: AMPUTATION TOE: SHX6595

## 2015-05-06 LAB — GLUCOSE, CAPILLARY
GLUCOSE-CAPILLARY: 92 mg/dL (ref 65–99)
GLUCOSE-CAPILLARY: 124 mg/dL — AB (ref 65–99)
Glucose-Capillary: 131 mg/dL — ABNORMAL HIGH (ref 65–99)
Glucose-Capillary: 126 mg/dL — ABNORMAL HIGH (ref 65–99)

## 2015-05-06 LAB — VANCOMYCIN, TROUGH: Vancomycin Tr: 18 ug/mL (ref 10–20)

## 2015-05-06 LAB — CREATININE, SERUM
Creatinine, Ser: 1.03 mg/dL (ref 0.61–1.24)
GFR calc Af Amer: >60 mL/min (ref >60)

## 2015-05-06 SURGERY — AMPUTATION, TOE
Anesthesia: General | Laterality: Right

## 2015-05-06 MED ORDER — ROPIVACAINE HCL 5 MG/ML IJ SOLN
INTRAMUSCULAR | Status: DC | PRN
  Administered 2015-05-06: 10 mL
  Administered 2015-05-06: 20 mL via PERINEURAL

## 2015-05-06 MED ORDER — LIDOCAINE HCL (CARDIAC) 20 MG/ML IV SOLN
INTRAVENOUS | Status: DC | PRN
  Administered 2015-05-06: 40 mg via INTRAVENOUS

## 2015-05-06 MED ORDER — PROPOFOL 10 MG/ML IV BOLUS
INTRAVENOUS | Status: DC | PRN
  Administered 2015-05-06: 150 mg via INTRAVENOUS

## 2015-05-06 MED ORDER — NEOMYCIN-POLYMYXIN B GU 40-200000 IR SOLN
Status: AC
Start: 2015-05-06 — End: 2015-05-06
  Filled 2015-05-06: qty 2

## 2015-05-06 MED ORDER — SODIUM CHLORIDE 0.9 % IV SOLN
INTRAVENOUS | Status: DC | PRN
  Administered 2015-05-06: 13:00:00 via INTRAVENOUS

## 2015-05-06 MED ORDER — OXYCODONE HCL 5 MG PO TABS
5.0000 mg | ORAL_TABLET | Freq: Once | ORAL | Status: AC | PRN
  Administered 2015-05-07: 5 mg via ORAL
  Filled 2015-05-06: qty 1

## 2015-05-06 MED ORDER — SODIUM CHLORIDE 0.9 % IJ SOLN: 3.0000 mL | INTRAMUSCULAR | Status: DC | PRN

## 2015-05-06 MED ORDER — LIDOCAINE HCL (PF) 1 % IJ SOLN
INTRAMUSCULAR | Status: DC | PRN
  Administered 2015-05-06: 1 mL via INTRADERMAL

## 2015-05-06 MED ORDER — MIDAZOLAM HCL 2 MG/2ML IJ SOLN
INTRAMUSCULAR | Status: DC | PRN
  Administered 2015-05-06: 2 mg via INTRAVENOUS

## 2015-05-06 MED ORDER — GADOBENATE DIMEGLUMINE 529 MG/ML IV SOLN
20.0000 mL | Freq: Once | INTRAVENOUS | Status: AC | PRN
  Administered 2015-05-06: 20 mL via INTRAVENOUS

## 2015-05-06 MED ORDER — FENTANYL CITRATE (PF) 100 MCG/2ML IJ SOLN: 25.0000 ug | INTRAMUSCULAR | Status: DC | PRN

## 2015-05-06 MED ORDER — SODIUM CHLORIDE 0.9 % IJ SOLN
3.0000 mL | Freq: Two times a day (BID) | INTRAMUSCULAR | Status: DC
  Administered 2015-05-06 – 2015-05-08 (×4): 3 mL via INTRAVENOUS

## 2015-05-06 MED ORDER — BUPIVACAINE HCL (PF) 0.5 % IJ SOLN
INTRAMUSCULAR | Status: AC
  Filled 2015-05-06: qty 30

## 2015-05-06 MED ORDER — IPRATROPIUM-ALBUTEROL 0.5-2.5 (3) MG/3ML IN SOLN
3.0000 mL | Freq: Once | RESPIRATORY_TRACT | Status: DC | PRN
  Administered 2015-05-07: 3 mL via RESPIRATORY_TRACT
  Filled 2015-05-06: qty 3

## 2015-05-06 MED ORDER — OXYCODONE HCL 5 MG/5ML PO SOLN: 5.0000 mg | Freq: Once | ORAL | Status: AC | PRN

## 2015-05-06 MED ORDER — SODIUM CHLORIDE 0.9 % IV SOLN: 250.0000 mL | INTRAVENOUS | Status: DC | PRN

## 2015-05-06 MED ORDER — FENTANYL CITRATE (PF) 100 MCG/2ML IJ SOLN
INTRAMUSCULAR | Status: DC | PRN
  Administered 2015-05-06: 50 ug via INTRAVENOUS

## 2015-05-06 SURGICAL SUPPLY — 50 items
BANDAGE ACE 4X5 VEL STRL LF (GAUZE/BANDAGES/DRESSINGS) ×3 IMPLANT
BANDAGE CONFORM 2X5YD N/S (GAUZE/BANDAGES/DRESSINGS) ×3 IMPLANT
BANDAGE ELASTIC 4 CLIP ST LF (GAUZE/BANDAGES/DRESSINGS) ×3 IMPLANT
BANDAGE ELASTIC 6 CLIP NS LF (GAUZE/BANDAGES/DRESSINGS) ×3 IMPLANT
BLADE MED AGGRESSIVE (BLADE) ×3 IMPLANT
BLADE OSC/SAGITTAL MD 5.5X18 (BLADE) IMPLANT
BLADE SURG 15 STRL LF DISP TIS (BLADE) ×2 IMPLANT
BLADE SURG 15 STRL SS (BLADE) ×4
BLADE SURG MINI STRL (BLADE) ×3 IMPLANT
BNDG COHESIVE 4X5 WHT NS (GAUZE/BANDAGES/DRESSINGS) ×3 IMPLANT
BNDG ESMARK 4X12 TAN STRL LF (GAUZE/BANDAGES/DRESSINGS) ×3 IMPLANT
BNDG GAUZE 4.5X4.1 6PLY STRL (MISCELLANEOUS) ×3 IMPLANT
CANISTER SUCT 1200ML W/VALVE (MISCELLANEOUS) ×3 IMPLANT
CLOSURE WOUND 1/4X4 (GAUZE/BANDAGES/DRESSINGS)
CUFF TOURN 18 STER (MISCELLANEOUS) ×3 IMPLANT
CUFF TOURN DUAL PL 12 NO SLV (MISCELLANEOUS) IMPLANT
DRAPE FLUOR MINI C-ARM 54X84 (DRAPES) IMPLANT
DURAPREP 26ML APPLICATOR (WOUND CARE) ×3 IMPLANT
GAUZE FLUFF 18X24 1PLY STRL (GAUZE/BANDAGES/DRESSINGS) IMPLANT
GAUZE PETRO XEROFOAM 1X8 (MISCELLANEOUS) ×3 IMPLANT
GAUZE SPONGE 4X4 12PLY STRL (GAUZE/BANDAGES/DRESSINGS) ×3 IMPLANT
GAUZE STRETCH 2X75IN STRL (MISCELLANEOUS) IMPLANT
GAUZE XEROFORM 4X4 STRL (GAUZE/BANDAGES/DRESSINGS) ×3 IMPLANT
GLOVE BIO SURGEON STRL SZ7.5 (GLOVE) ×9 IMPLANT
GLOVE INDICATOR 8.0 STRL GRN (GLOVE) ×3 IMPLANT
GOWN STRL REUS W/ TWL LRG LVL3 (GOWN DISPOSABLE) ×2 IMPLANT
GOWN STRL REUS W/TWL LRG LVL3 (GOWN DISPOSABLE) ×4
HANDPIECE VERSAJET DEBRIDEMENT (MISCELLANEOUS) IMPLANT
KIT RM TURNOVER STRD PROC AR (KITS) ×3 IMPLANT
LABEL OR SOLS (LABEL) ×3 IMPLANT
NEEDLE FILTER BLUNT 18X 1/2SAF (NEEDLE) ×2
NEEDLE FILTER BLUNT 18X1 1/2 (NEEDLE) ×1 IMPLANT
NEEDLE HYPO 25X1 1.5 SAFETY (NEEDLE) ×6 IMPLANT
NS IRRIG 500ML POUR BTL (IV SOLUTION) ×3 IMPLANT
PACK EXTREMITY ARMC (MISCELLANEOUS) ×3 IMPLANT
PAD ABD DERMACEA PRESS 5X9 (GAUZE/BANDAGES/DRESSINGS) ×3 IMPLANT
PAD GROUND ADULT SPLIT (MISCELLANEOUS) ×3 IMPLANT
PENCIL ELECTRO HAND CTR (MISCELLANEOUS) IMPLANT
SOL .9 NS 3000ML IRR  AL (IV SOLUTION)
SOL .9 NS 3000ML IRR UROMATIC (IV SOLUTION) IMPLANT
SOL PREP PVP 2OZ (MISCELLANEOUS) ×3
SOLUTION PREP PVP 2OZ (MISCELLANEOUS) ×1 IMPLANT
STOCKINETTE STRL 6IN 960660 (GAUZE/BANDAGES/DRESSINGS) ×3 IMPLANT
STRIP CLOSURE SKIN 1/4X4 (GAUZE/BANDAGES/DRESSINGS) IMPLANT
SUT ETHILON 4-0 (SUTURE) ×4
SUT ETHILON 4-0 FS2 18XMFL BLK (SUTURE) ×2
SUT ETHILON 5-0 FS-2 18 BLK (SUTURE) IMPLANT
SUTURE ETHLN 4-0 FS2 18XMF BLK (SUTURE) ×2 IMPLANT
SWAB CULTURE AMIES ANAERIB BLU (MISCELLANEOUS) ×3 IMPLANT
SYRINGE 10CC LL (SYRINGE) ×6 IMPLANT

## 2015-05-06 NOTE — Progress Notes (Signed)
ANTICOAGULATION CONSULT NOTE - Follow up  Pharmacy Consult for Enoxaparin Indication: atrial fibrillation  No Known Allergies  Patient Measurements: Height: 5\' 8"  (172.7 cm) Weight: 280 lb 12.8 oz (127.37 kg) IBW/kg (Calculated) : 68.4 Heparin Dosing Weight:   Vital Signs: Temp: 98.2 F (36.8 C) (11/20 0745) Temp Source: Oral (11/20 0745) BP: 141/43 mmHg (11/20 0745) Pulse Rate: 69 (11/20 0745)  Labs:  Recent Labs  05/04/15 1653 05/05/15 0425 05/06/15 0932  HGB 11.3* 10.0*  --   HCT 36.1* 31.8*  --   PLT 203 179  --   CREATININE 1.10 1.15 1.03    Estimated Creatinine Clearance: 100.5 mL/min (by C-G formula based on Cr of 1.03).   Medical History: Past Medical History  Diagnosis Date  . MI (myocardial infarction) ([place])   . Sepsis ([place])   . History of cardioversion   . Diabetes mellitus without complication ([place])   . Hypertension   . CAD (coronary artery disease)   . SVT (supraventricular tachycardia) ([place])   . OSA on CPAP   . A-fib ([place])   . Chronic systolic CHF (congestive heart failure) ([facility])     Medications:  Scheduled:  . aspirin EC  325 mg Oral Daily  . diltiazem  300 mg Oral Daily  . enoxaparin (LOVENOX) injection  1 mg/kg Subcutaneous Q12H  . fluticasone  1 spray Each Nare Daily  . furosemide  40 mg Oral BID  . glimepiride  4 mg Oral Q breakfast  . insulin aspart  0-9 Units Subcutaneous TID WC  . insulin detemir  30 Units Subcutaneous BID  . metFORMIN  1,000 mg Oral BID WC  . metoprolol tartrate  25 mg Oral BID  . mometasone-formoterol  2 puff Inhalation BID  . pantoprazole  40 mg Oral QAC breakfast  . piperacillin-tazobactam (ZOSYN)  IV  4.5 g Intravenous 3 times per day  . pramipexole  0.5 mg Oral QHS  . pregabalin  75 mg Oral BID  . simvastatin  40 mg Oral QHS  . traZODone  100 mg Oral QHS  . vancomycin  1,250 mg Intravenous Q8H    Assessment: Marcus Williams is a 59yo male admitted for diabetic foot infection. Pharmacy consulted to dose  enoxaparin for Afib due to potential for upcoming debridement. Patient is on rivaroxaban at home.   Goal of Therapy:  Monitor platelets by anticoagulation protocol: Yes   Plan:  11/18: Initiate lovenox 135mg  SQ q12hrs.  Initiate dose today at 1800, which will be ~24 hours after last home rivaroxaban dose.  11/19: Weight changed. Will transition to Lovenox 1 mg/kg (130 mg) SQ Q12h.  11/20: Continue Lovenox 130 mg (1 mg/kg) q12h. MRI of foot-Determination will be made for surgery after his MRI results are obtained per MD note.   Pharmacy will continue to monitor.  [name] PharmD Clinical Pharmacist 05/06/2015 12:10 PM

## 2015-05-06 NOTE — Transfer of Care (Signed)
Immediate Anesthesia Transfer of Care Note  Patient: Marcus Williams  Procedure(s) Performed: Procedure(s): AMPUTATION TOE (Right)  Patient Location: PACU  Anesthesia Type:General  Level of Consciousness: awake  Airway & Oxygen Therapy: Patient Spontanous Breathing and Patient connected to face mask oxygen  Post-op Assessment: Report given to RN and Post -op Vital signs reviewed and stable  Post vital signs: Reviewed and stable  Last Vitals:  Filed Vitals:   05/06/15 0430 05/06/15 0745  BP: 138/63 141/43  Pulse: 72 69  Temp: 37.4 C 36.8 C  Resp: 20 18    Complications: No apparent anesthesia complications

## 2015-05-06 NOTE — Interval H&P Note (Signed)
History and Physical Interval Note:  05/06/2015 12:55 PM  Marcus Williams  has presented today for surgery, with the diagnosis of right great toe infection  The various methods of treatment have been discussed with the patient and family. After consideration of risks, benefits and other options for treatment, the patient has consented to  Procedure(s): AMPUTATION TOE (Right) as a surgical intervention .  The patient's history has been reviewed, patient examined, no change in status, stable for surgery.  I have reviewed the patient's chart and labs.  Questions were answered to the patient's satisfaction.     [name] W.

## 2015-05-06 NOTE — Progress Notes (Signed)
[place] at [place] NAME: Marcus Williams    MR#:  [id number]  DATE OF BIRTH:  [birth year]  SUBJECTIVE:  CHIEF COMPLAINT:   Chief Complaint  Patient presents with  . Leg Swelling  . Chills   Pain is much better today. MRI done to check involvement of bone.  his wife is present in room.  REVIEW OF SYSTEMS:   CONSTITUTIONAL: No fever, positive for fatigue or weakness.  EYES: No blurred or double vision.  EARS, NOSE, AND THROAT: No tinnitus or ear pain.  RESPIRATORY: No cough, shortness of breath, wheezing or hemoptysis.  CARDIOVASCULAR: No chest pain, orthopnea, edema.  GASTROINTESTINAL: No nausea, vomiting, diarrhea or abdominal pain.  GENITOURINARY: No dysuria, hematuria.  ENDOCRINE: No polyuria, nocturia,  HEMATOLOGY: No anemia, easy bruising or bleeding SKIN: No rash or lesion. MUSCULOSKELETAL:Right foot great toe joint pain or arthritis. He also have pain radiating from his right lower back all the way to his thigh and leg.  NEUROLOGIC: No tingling, numbness, weakness.  PSYCHIATRY: No anxiety or depression.     ROS  DRUG ALLERGIES:  No Known Allergies  VITALS:  Blood pressure 153/68, pulse 71, temperature 98.2 F (36.8 C), temperature source Oral, resp. rate 20, height 5\' 8"  (1.727 m), weight 127.37 kg (280 lb 12.8 oz), SpO2 99 %.  PHYSICAL EXAMINATION:   GENERAL: 59 y.o.-year-old obase patient lying in the bed with no acute distress.  EYES: Pupils equal, round, reactive to light and accommodation. No scleral icterus. Extraocular muscles intact.  HEENT: Head atraumatic, normocephalic. Oropharynx and nasopharynx clear.  NECK: Supple, no jugular venous distention. No thyroid enlargement, no tenderness.  LUNGS: Normal breath sounds bilaterally, no wheezing, rales,rhonchi or crepitation. No use of accessory muscles of respiration.  CARDIOVASCULAR: S1, S2 normal. No murmurs, rubs, or gallops.  ABDOMEN:  Soft, nontender, nondistended. Bowel sounds present. No organomegaly or mass.  EXTREMITIES: No pedal edema, cyanosis, or clubbing on left leg. On the right leg swelling mild redness and warm to touch- but better than time of admission, right foot also appears little bit swollen compared to the left, in the right great toe is swollen and red, and at the lower and inner aspect of that there is some thickening in the skin with black spot and yellowish area surrounding that most likely the fluid collection underneath. On pressure to his right side sacroiliac joint there is no severe tenderness.  NEUROLOGIC: Cranial nerves II through XII are intact. Muscle strength 5/5 in all extremities. Sensation intact. Gait not checked. He is able to move his right lower extremity but it causes him severe pain in his right lower back.  PSYCHIATRIC: The patient is alert and oriented x 3.  SKIN: No obvious rash, lesion, or ulcer.    Physical Exam LABORATORY PANEL:   CBC  Recent Labs Lab 05/05/15 0425  WBC 11.5*  HGB 10.0*  HCT 31.8*  PLT 179   ------------------------------------------------------------------------------------------------------------------  Chemistries   Recent Labs Lab 05/04/15 1653 05/05/15 0425 05/06/15 0932  NA 138 137  --   K 3.7 3.4*  --   CL 100* 101  --   CO2 28 27  --   GLUCOSE 147* 160*  --   BUN 19 19  --   CREATININE 1.10 1.15 1.03  CALCIUM 9.3 8.4*  --   AST 26  --   --   ALT 27  --   --   ALKPHOS 66  --   --  BILITOT 0.6  --   --    ------------------------------------------------------------------------------------------------------------------  Cardiac Enzymes No results for input(s): TROPONINI in the last 168 hours. ------------------------------------------------------------------------------------------------------------------  RADIOLOGY:  Mr Foot Right W Wo Contrast  05/06/2015  CLINICAL DATA:  Two or 3 week history of pain, swelling and  erythema of the great toe with ulceration and drainage. Evaluate for osteomyelitis. Initial encounter. EXAM: MRI OF THE RIGHT FOREFOOT WITHOUT AND WITH CONTRAST TECHNIQUE: Multiplanar, multisequence MR imaging was performed both before and after administration of intravenous contrast. CONTRAST:  50mL MULTIHANCE GADOBENATE DIMEGLUMINE 529 MG/ML IV SOLN COMPARISON:  Radiographs 05/04/2015. FINDINGS: Despite efforts by the technologist and patient, significant motion artifact is present on today's exam and could not be eliminated. This reduces exam sensitivity and specificity. There is soft tissue T2 hyperintensity throughout the forefoot, greatest medially. Post-contrast images demonstrate diffuse soft tissue enhancement throughout the great toe. Plantar to the distal phalanx, there is a nonenhancing 10 x 21 x 23 mm focus of T2 hyperintensity which probably reflects a superficial abscess. No other focal fluid collections are identified. There is susceptibility artifact in the plantar aspect of the midfoot near the base of the first metatarsal, likely secondary to an incidental metallic foreign body, not imaged on recent radiographs. Patient did have a tiny metallic foreign body more laterally in the midfoot on 2009 radiographs. There is nonspecific T2 hyperintensity and enhancement within the distal phalanx of the great toe which may be related to the tuftal fracture seen on today's radiographs. No cortical destruction or more proximal marrow changes are identified to strongly suggest osteomyelitis. The proximal phalanx and first metatarsal appear normal. The additional toes and metatarsals appear normal. IMPRESSION: 1. Study is significantly motion degraded, limiting sensitivity and specificity. 2. Inflammatory changes in the great toe with probable small superficial abscess plantar to the distal phalanx. 3. Nonspecific bone marrow edema and enhancement within the distal phalanx of the great toe. These marrow changes  could be related to the known fracture and do not necessarily imply superimposed osteomyelitis. No cortical destruction identified. 4. Suspected metallic foreign body within the plantar aspect of the midfoot near the base of the first metatarsal with resulting susceptibility artifact. In discussion with Dr. Cleda Mccreedy, the patient does not have any inflammatory change in this area to suggest this represents soft tissue emphysema. Electronically Signed   By: Richardean Sale M.D.   On: 05/06/2015 12:42   US Venous Img Lower Unilateral Right  05/05/2015  CLINICAL DATA:  Right lower extremity pain and swelling for 3 weeks. EXAM: RIGHT LOWER EXTREMITY VENOUS DOPPLER ULTRASOUND TECHNIQUE: Gray-scale sonography with graded compression, as well as color Doppler and duplex ultrasound were performed to evaluate the lower extremity deep venous systems from the level of the common femoral vein and including the common femoral, femoral, profunda femoral, popliteal and calf veins including the posterior tibial, peroneal and gastrocnemius veins when visible. The superficial great saphenous vein was also interrogated. Spectral Doppler was utilized to evaluate flow at rest and with distal augmentation maneuvers in the common femoral, femoral and popliteal veins. COMPARISON:  None. FINDINGS: Contralateral Common Femoral Vein: Respiratory phasicity is normal and symmetric with the symptomatic side. No evidence of thrombus. Normal compressibility. Common Femoral Vein: No evidence of thrombus. Normal compressibility, respiratory phasicity and response to augmentation. Saphenofemoral Junction: No evidence of thrombus. Normal compressibility and flow on color Doppler imaging. Profunda Femoral Vein: No evidence of thrombus. Normal compressibility and flow on color Doppler imaging. Femoral Vein: No evidence of thrombus.  Normal compressibility, respiratory phasicity and response to augmentation. Popliteal Vein: No evidence of thrombus. Normal  compressibility, respiratory phasicity and response to augmentation. Calf Veins: No evidence of thrombus. Normal compressibility and flow on color Doppler imaging. Superficial Great Saphenous Vein: No evidence of thrombus. Normal compressibility and flow on color Doppler imaging. Venous Reflux:  None. Other Findings:  None. IMPRESSION: No evidence of deep venous thrombosis. Electronically Signed   By: Lajean Manes M.D.   On: 05/05/2015 13:42    ASSESSMENT AND PLAN:   Principal Problem:   Cellulitis  * Right great toe wounds and leg cellulitis  IV vancomycin and Zosyn. Podiatry consult appreciated- ordered MRI on foot, might need some debridement on his toe. DVT studies are negative done 2 days ago in ER.   But as has swelling and pain- repeated doppler studies- negative.    Pain is little better today so is the swelling on right leg.  * Right lower back pain with radiation up to the leg Most likely this sciatica negative CT scan of the lumbar spine to check for any abnormalities.  * Diabetes Continue his oral medications but   decrease slightly on his NPH doses     on sliding scale coverage .   Blood sugar is under control.  * Atrial fibrillation Rate under control currently continue metoprolol, Cardizem.  Hold  Xarelto as he might need some debridement by podiatry , put him on Lovenox therapeutic dose so he can easily go for the surgery if required.  resume xarelto on d/c.  * Coronary artery disease Continue aspirin,   on full dose Lovenox,    continue metoprolol and statin.  * COPD No active wheezing continue home inhalers and nebulizers.    All the records are reviewed and case discussed with Care Management/Social Workerr. Management plans discussed with the patient, family and they are in agreement.  CODE STATUS: full  TOTAL TIME TAKING CARE OF THIS PATIENT: 35 minutes.   POSSIBLE D/C IN 2-3 DAYS, DEPENDING ON CLINICAL CONDITION.   Vaughan Basta  M.D on 05/06/2015   Between 7am to 6pm - Pager - [phone]  After 6pm go to [email or web address] - password [email or web address] [facility]  Office  [phone]  CC: Primary care physician; Idelle Crouch, MD  Note: This dictation was prepared with Dragon dictation along with smaller phrase technology. Any transcriptional errors that result from this process are unintentional.

## 2015-05-06 NOTE — Anesthesia Procedure Notes (Addendum)
Anesthesia Regional Block:  Popliteal block  Pre-Anesthetic Checklist: ,, timeout performed, Correct Patient, Correct Site, Correct Laterality, Correct Procedure, Correct Position, site marked, Risks and benefits discussed,  Surgical consent,  Pre-op evaluation,  At surgeon's request and post-op pain management  Laterality: Right and Lower  Prep: chloraprep       Needles:  Injection technique: Single-shot  Needle Type: Echogenic Needle     Needle Length: 9cm 9 cm Needle Gauge: 21 and 21 G    Additional Needles:  Procedures: ultrasound guided (picture in chart) Popliteal block Narrative:  Start time: 05/06/2015 1:36 PM End time: 05/06/2015 1:38 PM Injection made incrementally with aspirations every 5 mL.  Performed by: Personally  Anesthesiologist: Katy Fitch K  Additional Notes: Functioning IV was confirmed and monitors were applied.  A echogenic needle was used. Sterile prep,hand hygiene and sterile gloves were used.  A saphenous ring was also placed at the level of the knee. Negative aspiration and negative test dose prior to incremental administration of local anesthetic. The patient tolerated the procedure well with no immediate complications.   Procedure Name: LMA Insertion Date/Time: 05/06/2015 1:40 PM Performed by: Moustafa Mossa Pre-anesthesia Checklist: Patient identified, Emergency Drugs available, Suction available, Patient being monitored and Timeout performed Patient Re-evaluated:Patient Re-evaluated prior to inductionOxygen Delivery Method: Circle system utilized Preoxygenation: Pre-oxygenation with 100% oxygen Intubation Type: IV induction Tube secured with: Tape Dental Injury: Teeth and Oropharynx as per pre-operative assessment

## 2015-05-06 NOTE — Care Management Important Message (Signed)
Important Message  Patient Details  Name: JAKAIDEN JABS MRN: [id number] Date of Birth: April 09, 1956   Medicare Important Message Given:  Yes    Lea Baine A, RN 05/06/2015, 9:45 AM

## 2015-05-06 NOTE — Anesthesia Preprocedure Evaluation (Addendum)
Anesthesia Evaluation  Patient identified by MRN, date of birth, ID band Patient awake    Reviewed: Allergy & Precautions, H&P , NPO status , Patient's Chart, lab work & pertinent test results  History of Anesthesia Complications Negative for: history of anesthetic complications  Airway Mallampati: III  TM Distance: >3 FB Neck ROM: limited    Dental no notable dental hx. (+) Poor Dentition, Missing   Pulmonary shortness of breath, sleep apnea , COPD,    Pulmonary exam normal breath sounds clear to auscultation       Cardiovascular Exercise Tolerance: Poor hypertension, (-) angina+ CAD, + Past MI, +CHF and + DOE  Normal cardiovascular exam+ dysrhythmias Atrial Fibrillation + Valvular Problems/Murmurs AS  Rhythm:regular Rate:Normal     Neuro/Psych negative neurological ROS  negative psych ROS   GI/Hepatic negative GI ROS, Neg liver ROS,   Endo/Other  diabetes, Poorly Controlled, Type 2Morbid obesity  Renal/GU negative Renal ROS  negative genitourinary   Musculoskeletal   Abdominal   Peds  Hematology negative hematology ROS (+)   Anesthesia Other Findings Past Medical History:   MI (myocardial infarction) ([place])                             Sepsis ([place])                                                 History of cardioversion                                     Diabetes mellitus without complication (HCC)                 Hypertension                                                 CAD (coronary artery disease)                                SVT (supraventricular tachycardia) (HCC)                     OSA on CPAP                                                  A-fib (HCC)                                                  Chronic systolic CHF (congestive heart failure*             Past Surgical History:   quadruple bypass  KNEE SURGERY                                                   BUNIONECTOMY                                                  CORONARY ARTERY BYPASS GRAFT                                 BMI    Body Mass Index   42.70 kg/m 2    Patient reports pre existing numbness in both legs 2/2 DM  Reproductive/Obstetrics negative OB ROS                          Anesthesia Physical Anesthesia Plan  ASA: IV  Anesthesia Plan: General LMA   Post-op Pain Management: GA combined w/ Regional for post-op pain   Induction:   Airway Management Planned:   Additional Equipment:   Intra-op Plan:   Post-operative Plan:   Informed Consent: I have reviewed the patients History and Physical, chart, labs and discussed the procedure including the risks, benefits and alternatives for the proposed anesthesia with the patient or authorized representative who has indicated his/her understanding and acceptance.   Dental Advisory Given  Plan Discussed with: Anesthesiologist, CRNA and Surgeon  Anesthesia Plan Comments: (Patient informed that they are higher risk for complications from anesthesia during this procedure due to their medical history.  Patient voiced understanding. )       Anesthesia Quick Evaluation

## 2015-05-06 NOTE — Anesthesia Postprocedure Evaluation (Signed)
Anesthesia Post Note  Patient: Marcus Williams  Procedure(s) Performed: Procedure(s) (LRB): AMPUTATION TOE (Right)  Patient location during evaluation: PACU Anesthesia Type: General Level of consciousness: awake and alert Pain management: pain level controlled Vital Signs Assessment: post-procedure vital signs reviewed and stable Respiratory status: spontaneous breathing, nonlabored ventilation, respiratory function stable and patient connected to nasal cannula oxygen Cardiovascular status: blood pressure returned to baseline and stable Postop Assessment: No signs of nausea or vomiting Anesthetic complications: no    Last Vitals:  Filed Vitals:   05/06/15 1718 05/06/15 1813  BP: 165/67 141/60  Pulse: 75 78  Temp: 36.5 C 36.6 C  Resp: 18 18    Last Pain:  Filed Vitals:   05/06/15 1813  PainSc: 0-No pain        RLE Motor Response: Non-purposeful movement RLE Sensation: Decreased      Precious Haws Quashon Jesus

## 2015-05-06 NOTE — Progress Notes (Signed)
ANTIBIOTIC CONSULT NOTE -Follow up  Pharmacy Consult for Vancomycin and Zosyn Indication: Diabetic Foot Infection  No Known Allergies  Patient Measurements: Height: 5\' 8"  (172.7 cm) Weight: 280 lb 12.8 oz (127.37 kg) IBW/kg (Calculated) : 68.4 Adjusted Body Weight: 94.4kg (Pt's TBW is 95% different from IBW)  Vital Signs: Temp: 98.2 F (36.8 C) (11/20 0745) Temp Source: Oral (11/20 0745) BP: 141/43 mmHg (11/20 0745) Pulse Rate: 69 (11/20 0745) Intake/Output from previous day: 11/19 0701 - 11/20 0700 In: 480 [P.O.:480] Out: -  Intake/Output from this shift:    Labs:  Recent Labs  05/04/15 1653 05/05/15 0425 05/06/15 0932  WBC 14.8* 11.5*  --   HGB 11.3* 10.0*  --   PLT 203 179  --   CREATININE 1.10 1.15 1.03   Estimated Creatinine Clearance: 100.5 mL/min (by C-G formula based on Cr of 1.03).  Recent Labs  05/06/15 0932  [place] [facility]     Microbiology: Recent Results (from the past 720 hour(s))  Wound culture     Status: None (Preliminary result)   Collection Time: 05/05/15  2:52 PM  Result Value Ref Range Status   Specimen Description ABSCESS  Final   Special Requests NONE  Final   Gram Stain PENDING  Incomplete   Culture   Final    LIGHT GROWTH STAPHYLOCOCCUS AUREUS SUSCEPTIBILITIES TO FOLLOW    Report Status PENDING  Incomplete  Surgical pcr screen     Status: Abnormal   Collection Time: 05/05/15  3:07 PM  Result Value Ref Range Status   MRSA, PCR NEGATIVE NEGATIVE Final   Staphylococcus aureus POSITIVE (A) NEGATIVE Final    Comment:        The Xpert SA Assay (FDA approved for NASAL specimens in patients over 69 years of age), is one component of a comprehensive surveillance program.  Test performance has been validated by [facility] for patients greater than or equal to 28 year old. It is not intended to diagnose infection nor to guide or monitor treatment.   Culture, blood (routine x 2)     Status: None (Preliminary result)    Collection Time: 05/05/15 10:22 PM  Result Value Ref Range Status   Specimen Description BLOOD LEFT ASSIST CONTROL  Final   Special Requests BOTTLES DRAWN AEROBIC AND ANAEROBIC 4CC  Final   Culture NO GROWTH < 12 HOURS  Final   Report Status PENDING  Incomplete  Culture, blood (routine x 2)     Status: None (Preliminary result)   Collection Time: 05/05/15 10:31 PM  Result Value Ref Range Status   Specimen Description BLOOD RIGHT ASSIST CONTROL  Final   Special Requests BOTTLES DRAWN AEROBIC AND ANAEROBIC 3CC  Final   Culture NO GROWTH < 12 HOURS  Final   Report Status PENDING  Incomplete    Medical History: Past Medical History  Diagnosis Date  . MI (myocardial infarction) ([place])   . Sepsis ([place])   . History of cardioversion   . Diabetes mellitus without complication ([place])   . Hypertension   . CAD (coronary artery disease)   . SVT (supraventricular tachycardia) ([place])   . OSA on CPAP   . A-fib ([place])   . Chronic systolic CHF (congestive heart failure) (HCC)     Medications:  Scheduled:  . aspirin EC  325 mg Oral Daily  . diltiazem  300 mg Oral Daily  . enoxaparin (LOVENOX) injection  1 mg/kg Subcutaneous Q12H  . fluticasone  1 spray Each Nare Daily  .  furosemide  40 mg Oral BID  . glimepiride  4 mg Oral Q breakfast  . insulin aspart  0-9 Units Subcutaneous TID WC  . insulin detemir  30 Units Subcutaneous BID  . metFORMIN  1,000 mg Oral BID WC  . metoprolol tartrate  25 mg Oral BID  . mometasone-formoterol  2 puff Inhalation BID  . pantoprazole  40 mg Oral QAC breakfast  . piperacillin-tazobactam (ZOSYN)  IV  4.5 g Intravenous 3 times per day  . pramipexole  0.5 mg Oral QHS  . pregabalin  75 mg Oral BID  . simvastatin  40 mg Oral QHS  . traZODone  100 mg Oral QHS  . vancomycin  1,250 mg Intravenous Q8H   Assessment: Marcus Williams is a 59yo male admitted for a diabetic foot infection. Pharmacy is consulted to dose Vancomycin and Zosyn in this patient for the same. Ke=  0.085 T1/2= 8.15 Vd= 93.38L  Expected Cmax= 23.65 Expected Cmin= 13.04  Goal of Therapy:  Vancomycin trough level 15-20 mcg/ml  Plan:  Measure antibiotic drug levels at steady state Follow up culture results  Initiate Zosyn 4.5gm EIV q8hrs, due to patient's larger size (BMI 45). Initiate Vancomycin 1250mg  IV q8hrs starting 6 hours after the first dose. Due to patient's larger size, will expect some accumulation to make him therapeutic. Check vanc trough prior to 5th dose at 2300 on 11/19.   11/19: Vancomycin dose hung late and trough time adjusted to 11/20 at 0930.  11/20: Vancomycin trough= 18 mcg/ml. Will continue current regimen of vancomycin 1250mg  IV q8h. Follow Renal fxn as patient is a risk for accumulation due to obesity. Abscess wound cx: +lt growth Staph aureus, Blood cx NGTD. MRI to evaluate for Osteomyelitis. Continue Zosyn 4.5 gm q8h EI.  Pharmacy will continue to monitor for changes in renal function or culture results.  [name] PharmD Clinical Pharmacist 05/06/2015 12:06 PM

## 2015-05-06 NOTE — H&P (View-Only) (Signed)
Reason for Consult: Cellulitis with ulceration right great toe Referring Physician: [facility]  Marcus Williams is an 59 y.o. male.  HPI: The patient relates a 2 to three-week history of some swelling and redness in his right great toe. Has had some drainage from an ulceration on the bottom of his great toe. He denies any specific history of recent injury. States that some time back a trailer did drop on his great toe. Recently also started to have some significant swelling and redness in his right leg. Presented to the emergency room where he was admitted for further work-up.  Past Medical History  Diagnosis Date  . MI (myocardial infarction) ([place])   . Sepsis ([place])   . History of cardioversion   . Diabetes mellitus without complication ([place])   . Hypertension   . CAD (coronary artery disease)   . SVT (supraventricular tachycardia) ([place])   . OSA on CPAP   . A-fib ([place])   . Chronic systolic CHF (congestive heart failure) [facility])     Past Surgical History  Procedure Laterality Date  . Quadruple bypass    . Knee surgery    . Bunionectomy    . Coronary artery bypass graft      Family History  Problem Relation Age of Onset  . Stroke Mother   . Heart attack Mother   . Hypertension Mother   . Heart attack Father   . Hypertension Father   . Heart attack Brother     #1  . Diabetes Brother     #1  . Heart disease Brother     #2  . Lung disease Brother     #2  . Hypertension Brother     #2  . Diabetes Brother     #2    Social History:  reports that he has never smoked. He does not have any smokeless tobacco history on file. He reports that he does not drink alcohol or use illicit drugs.  Allergies: No Known Allergies  Medications: I have reviewed the patient's current medications.  Results for orders placed or performed during the hospital encounter of 05/04/15 (from the past 48 hour(s))  Comprehensive metabolic panel     Status: Abnormal   Collection Time:  05/04/15  4:53 PM  Result Value Ref Range   Sodium 138 135 - 145 mmol/L   Potassium 3.7 3.5 - 5.1 mmol/L   Chloride 100 (L) 101 - 111 mmol/L   CO2 28 22 - 32 mmol/L   Glucose, Bld 147 (H) 65 - 99 mg/dL   BUN 19 6 - 20 mg/dL   Creatinine, Ser 1.10 0.61 - 1.24 mg/dL   Calcium 9.3 8.9 - 10.3 mg/dL   Total Protein 8.0 6.5 - 8.1 g/dL   Albumin 3.9 3.5 - 5.0 g/dL   AST 26 15 - 41 U/L   ALT 27 17 - 63 U/L   Alkaline Phosphatase 66 38 - 126 U/L   Total Bilirubin 0.6 0.3 - 1.2 mg/dL   GFR calc non Af Amer >60 >60 mL/min   GFR calc Af Amer >60 >60 mL/min    Comment: (NOTE) The eGFR has been calculated using the CKD EPI equation. This calculation has not been validated in all clinical situations. eGFR's persistently <60 mL/min signify possible Chronic Kidney Disease.    Anion gap 10 5 - 15  CBC     Status: Abnormal   Collection Time: 05/04/15  4:53 PM  Result Value Ref Range  WBC 14.8 (H) 3.8 - 10.6 K/uL   RBC 4.80 4.40 - 5.90 MIL/uL   Hemoglobin 11.3 (L) 13.0 - 18.0 g/dL   HCT 36.1 (L) 40.0 - 52.0 %   MCV 75.3 (L) 80.0 - 100.0 fL   MCH 23.6 (L) 26.0 - 34.0 pg   MCHC 31.3 (L) 32.0 - 36.0 g/dL   RDW 17.4 (H) 11.5 - 14.5 %   Platelets 203 150 - 440 K/uL  Urinalysis complete, with microscopic ([facility] only)     Status: Abnormal   Collection Time: 05/04/15  4:53 PM  Result Value Ref Range   Color, Urine YELLOW (A) YELLOW   APPearance CLEAR (A) CLEAR   Glucose, UA NEGATIVE NEGATIVE mg/dL   Bilirubin Urine NEGATIVE NEGATIVE   Ketones, ur NEGATIVE NEGATIVE mg/dL   Specific Gravity, Urine 1.017 1.005 - 1.030   Hgb urine dipstick 1+ (A) NEGATIVE   pH 5.0 5.0 - 8.0   Protein, ur 30 (A) NEGATIVE mg/dL   Nitrite NEGATIVE NEGATIVE   Leukocytes, UA NEGATIVE NEGATIVE   RBC / HPF 0-5 0 - 5 RBC/hpf   WBC, UA 0-5 0 - 5 WBC/hpf   Bacteria, UA NONE SEEN NONE SEEN   Squamous Epithelial / LPF 0-5 (A) NONE SEEN   Mucous PRESENT    Hyaline Casts, UA PRESENT   Glucose, capillary     Status:  Abnormal   Collection Time: 05/04/15  8:33 PM  Result Value Ref Range   Glucose-Capillary 128 (H) 65 - 99 mg/dL  Basic metabolic panel     Status: Abnormal   Collection Time: 05/05/15  4:25 AM  Result Value Ref Range   Sodium 137 135 - 145 mmol/L   Potassium 3.4 (L) 3.5 - 5.1 mmol/L   Chloride 101 101 - 111 mmol/L   CO2 27 22 - 32 mmol/L   Glucose, Bld 160 (H) 65 - 99 mg/dL   BUN 19 6 - 20 mg/dL   Creatinine, Ser 1.15 0.61 - 1.24 mg/dL   Calcium 8.4 (L) 8.9 - 10.3 mg/dL   GFR calc non Af Amer >60 >60 mL/min   GFR calc Af Amer >60 >60 mL/min    Comment: (NOTE) The eGFR has been calculated using the CKD EPI equation. This calculation has not been validated in all clinical situations. eGFR's persistently <60 mL/min signify possible Chronic Kidney Disease.    Anion gap 9 5 - 15  CBC     Status: Abnormal   Collection Time: 05/05/15  4:25 AM  Result Value Ref Range   WBC 11.5 (H) 3.8 - 10.6 K/uL   RBC 4.19 (L) 4.40 - 5.90 MIL/uL   Hemoglobin 10.0 (L) 13.0 - 18.0 g/dL   HCT 31.8 (L) 40.0 - 52.0 %   MCV 75.8 (L) 80.0 - 100.0 fL   MCH 23.7 (L) 26.0 - 34.0 pg   MCHC 31.3 (L) 32.0 - 36.0 g/dL   RDW 17.1 (H) 11.5 - 14.5 %   Platelets 179 150 - 440 K/uL  Glucose, capillary     Status: Abnormal   Collection Time: 05/05/15  7:18 AM  Result Value Ref Range   Glucose-Capillary 155 (H) 65 - 99 mg/dL  Glucose, capillary     Status: Abnormal   Collection Time: 05/05/15 12:01 PM  Result Value Ref Range   Glucose-Capillary 205 (H) 65 - 99 mg/dL    Ct Lumbar Spine Wo Contrast  05/04/2015  CLINICAL DATA:  Right leg pain EXAM: CT LUMBAR SPINE WITHOUT CONTRAST TECHNIQUE:  Multidetector CT imaging of the lumbar spine was performed without intravenous contrast administration. Multiplanar CT image reconstructions were also generated. COMPARISON:  None. FINDINGS: There are 5 non-rib-bearing vertebral bodies. The alignment of the lumbosacral spine is normal. No evidence of fracture or subluxation.  There are multilevel osteoarthritic changes worse at T12-L1, L4-L5 and L5-S1, with disc space narrowing, mild endplate sclerosis and small osteophyte formation. There is broad-based central disc protrusion and L5-S1, without significant neural foramina narrowing. No evidence of bony neural foramina narrowing at any level. IMPRESSION: No evidence of fracture or subluxation of the lumbosacral spine. Mild multilevel osteoarthritic changes, without evidence of bony neural foramina narrowing. Electronically Signed   By: Fidela Salisbury M.D.   On: 05/04/2015 19:43   US Venous Img Lower Unilateral Right  05/05/2015  CLINICAL DATA:  Right lower extremity pain and swelling for 3 weeks. EXAM: RIGHT LOWER EXTREMITY VENOUS DOPPLER ULTRASOUND TECHNIQUE: Gray-scale sonography with graded compression, as well as color Doppler and duplex ultrasound were performed to evaluate the lower extremity deep venous systems from the level of the common femoral vein and including the common femoral, femoral, profunda femoral, popliteal and calf veins including the posterior tibial, peroneal and gastrocnemius veins when visible. The superficial great saphenous vein was also interrogated. Spectral Doppler was utilized to evaluate flow at rest and with distal augmentation maneuvers in the common femoral, femoral and popliteal veins. COMPARISON:  None. FINDINGS: Contralateral Common Femoral Vein: Respiratory phasicity is normal and symmetric with the symptomatic side. No evidence of thrombus. Normal compressibility. Common Femoral Vein: No evidence of thrombus. Normal compressibility, respiratory phasicity and response to augmentation. Saphenofemoral Junction: No evidence of thrombus. Normal compressibility and flow on color Doppler imaging. Profunda Femoral Vein: No evidence of thrombus. Normal compressibility and flow on color Doppler imaging. Femoral Vein: No evidence of thrombus. Normal compressibility, respiratory phasicity and  response to augmentation. Popliteal Vein: No evidence of thrombus. Normal compressibility, respiratory phasicity and response to augmentation. Calf Veins: No evidence of thrombus. Normal compressibility and flow on color Doppler imaging. Superficial Great Saphenous Vein: No evidence of thrombus. Normal compressibility and flow on color Doppler imaging. Venous Reflux:  None. Other Findings:  None. IMPRESSION: No evidence of deep venous thrombosis. Electronically Signed   By: Lajean Manes M.D.   On: 05/05/2015 13:42   Dg Toe Great Right  05/04/2015  CLINICAL DATA:  Diabetic wound, great toe.  Pain, swelling, redness. EXAM: RIGHT GREAT TOE COMPARISON:  03/03/2008 FINDINGS: Fracture across the tuft of the distal phalanx, distracted 1 mm. Narrowing of the articular cartilage in the interphalangeal joint of the great toe. Small marginal spurs at the base of the proximal phalanx and from the first metatarsal head. Vascular calcifications at the metatarsal level. No subcutaneous gas or radiodense foreign body is identified. IMPRESSION: 1. Minimally displaced fracture, tuft distal phalanx right great toe. Electronically Signed   By: Lucrezia Europe M.D.   On: 05/04/2015 17:55    Review of Systems  Constitutional: Negative.   HENT: Negative.   Eyes: Negative.   Respiratory: Negative.   Cardiovascular: Positive for leg swelling.  Gastrointestinal: Negative.   Genitourinary: Negative.   Musculoskeletal: Negative.   Skin:       Redness and swelling in the right great toe with a draining ulcer on the bottom  Neurological:       Numbness and paresthesias in the feet from his diabetes  Endo/Heme/Allergies: Negative.   Psychiatric/Behavioral: Negative.    Blood pressure 151/55, pulse 81, temperature  98.7 F (37.1 C), temperature source Oral, resp. rate 18, height _0  (1.727 m), weight 127.37 kg (280 lb 12.8 oz), SpO2 93 %. Physical Exam  Cardiovascular:  DP and PT pulses are palpable. Capillary filling time  appears to be intact  Musculoskeletal:  Adequate range of motion of the pedal joints. Muscle testing is deferred  Neurological:  Loss of protective threshold with a monofilament wire. Proprioception is impaired  Skin:  The skin is warm dry and mildly atrophic. Significant edema in the right lower extremity with some hyperpigmentation in the leg. Significant focal edema and erythema in the right great toe. A full-thickness plantar ulceration is noted on the plantar aspect of the hallux approximately 2-3 mm diameter which does probe all the way down towards the bone. Significant purulence is expressed from the wound. A larger more superficial area of ulceration is present approximately 2 cm diameter    Assessment/Plan: Assessment: Full-thickness ulceration with abscess right hallux. Diabetes with associated neuropathy.  Plan: Debrided devitalized tissue full-thickness from the ulceration sharply using tissue nippers. A culture was taken of the abscess for sensitivities. A wet to dry dressing was applied to the right great toe. Discussed with the patient the possibility of bone infection due to the deep abscess and the possible need for debridement or amputation of the great toe. Plain film x-rays do not clearly show bone destruction but did show a fracture at the distal phalanx of the hallux. We will obtain an MRI for evaluation of osteomyelitis. Determination will be made for surgery after his MRI results are obtained  Shaul Trautman W. 05/05/2015, 2:50 PM

## 2015-05-06 NOTE — Op Note (Signed)
Date of operation: 05/06/2015.  Surgeon: Durward Fortes DPM.  Preoperative diagnosis: Abscess with infection right great toe.  Postoperative diagnosis: Same.  Procedure: Partial amputation right great toe.  Anesthesia: LMA with popliteal block right leg.  Hemostasis: Esmarch tourniquet right ankle.  Estimated blood loss: 20 cc.  Pathology: Right great toe.  Complications: None apparent.  Operative indications: Patient was recently hospitalized for a 2 to three-week history of infection with an ulceration on his right great toe. Due to the deep extension of the abscess beneath the right hallux and MRI was ordered. The MRI was inconclusive for evidence of osteomyelitis. Due to the extent of the abscess with the amount of drainage and possible bone infection decision was made for amputation of the great toe.  Operative procedure: Patient was taken to the operating room and placed on the supine position. A popliteal block was administered by anesthesia. Patient then underwent general anesthesia with an LMA and a saphenous block was performed again by anesthesia. A pneumatic tourniquet was applied at the level of the right ankle. Foot was prepped and draped in the usual sterile fashion. Difficulty was encountered trying to inflate the pneumatic tourniquet and the foot was then exsanguinated using an Esmarch and the Esmarch left intact around the ankle as a tourniquet.   Attention was then directed to the distal aspect of the right foot where an elliptical incision was made coursing dorsal to plantar around the medial and lateral base of the great toe. The incision was deepened sharply down to the level of bone. Periosteal dissection performed back to the base of the proximal phalanx. Using a pneumatic saw the proximal phalanx was transected and the head of the proximal phalanx and distal toe were then removed in toto. Good healthy bleeding tissues were noted upon removal. The wound was flushed with  copious amounts of sterile saline and closed using 4-0 nylon vertical mattress and simple interrupted sutures. Xeroform and a sterile bandage applied to the right foot. The Esmarch tourniquet was released. Kerlix and an Ace wrap then applied for compression. Patient tolerated the procedure and anesthesia well and was transported to the PACU with vital signs stable and in good condition.

## 2015-05-06 NOTE — Care Management Note (Signed)
Case Management Note  Patient Details  Name: Marcus Williams MRN: [id number] Date of Birth: 1955-09-22  Subjective/Objective:    59yo Mr Marcus Williams was admitted 05/04/15 with a right great toe infection. Today, 05/06/15, Mr Marcus Williams is currently in surgery for a right great toe amputation. Resides with his wife at home. PCP=Dr Sparks. Pharmacy=[facility] in [place]. Hx: COPD, CHF, [facility]. Wife reports no home assistive equipment and no home oxygen. Has used [facility] in the past and wife would like to use [facility] again for home health services. Anticipate discharge home with home health. Will need a rolling walker for temporary assistance with walking and balance after great toe amputation and weight of 280lbs. Case management will follow for discharge planning.                 Action/Plan:   Expected Discharge Date:                  Expected Discharge Plan:     In-House Referral:     Discharge planning Services     Post Acute Care Choice:    Choice offered to:     DME Arranged:    DME Agency:     HH Arranged:    [place] Agency:     Status of Service:     Medicare Important Message Given:  Yes Date Medicare IM Given:    Medicare IM give by:    Date Additional Medicare IM Given:    Additional Medicare Important Message give by:     If discussed at Walker of Stay Meetings, dates discussed:    Additional Comments:  Marcus Williams A, RN 05/06/2015, 1:27 PM

## 2015-05-07 ENCOUNTER — Encounter: Payer: Self-pay | Admitting: Podiatry

## 2015-05-07 ENCOUNTER — Inpatient Hospital Stay: Payer: Commercial Managed Care - HMO

## 2015-05-07 LAB — BASIC METABOLIC PANEL
ANION GAP: 8 (ref 5–15)
BUN: 12 mg/dL (ref 6–20)
CHLORIDE: 102 mmol/L (ref 101–111)
CO2: 27 mmol/L (ref 22–32)
Calcium: 8.3 mg/dL — ABNORMAL LOW (ref 8.9–10.3)
Creatinine, Ser: 0.91 mg/dL (ref 0.61–1.24)
GFR calc Af Amer: >60 mL/min (ref >60)
GFR calc non Af Amer: >60 mL/min (ref >60)
Glucose, Bld: 108 mg/dL — ABNORMAL HIGH (ref 65–99)
POTASSIUM: 3.4 mmol/L — AB (ref 3.5–5.1)
SODIUM: 137 mmol/L (ref 135–145)

## 2015-05-07 LAB — GLUCOSE, CAPILLARY
GLUCOSE-CAPILLARY: 133 mg/dL — AB (ref 65–99)
GLUCOSE-CAPILLARY: 192 mg/dL — AB (ref 65–99)
GLUCOSE-CAPILLARY: 111 mg/dL — AB (ref 65–99)
GLUCOSE-CAPILLARY: 105 mg/dL — AB (ref 65–99)

## 2015-05-07 LAB — CBC
HCT: 30.4 % — ABNORMAL LOW (ref 40.0–52.0)
HEMOGLOBIN: 10 g/dL — AB (ref 13.0–18.0)
MCH: 24.8 pg — AB (ref 26.0–34.0)
MCHC: 32.8 g/dL (ref 32.0–36.0)
MCV: 75.5 fL — ABNORMAL LOW (ref 80.0–100.0)
Platelets: 179 10*3/uL (ref 150–440)
RBC: 4.03 MIL/uL — AB (ref 4.40–5.90)
RDW: 17.4 % — ABNORMAL HIGH (ref 11.5–14.5)
WBC: 7.5 10*3/uL (ref 3.8–10.6)

## 2015-05-07 IMAGING — CT CT ANGIO CHEST
1 of 2 series · 18 of 30 positions shown · IV contrast (APPLIED)
Comparison: DG CHEST 2V dated 03/01/2013

CLINICAL DATA: Shortness of breath and chest pain.

EXAM:
CT ANGIOGRAPHY CHEST WITH CONTRAST
TECHNIQUE: Multidetector CT imaging of the chest was performed using the
standard protocol during bolus administration of intravenous
contrast. Multiplanar CT image reconstructions and MIPs were
obtained to evaluate the vascular anatomy.
CONTRAST:  100 mL Isovue 370

[Series 5: pe 1.0 thins · axial · 0.77mm/px · z∈[-584,-326]mm · 18 of 292 slices shown]
[im 17/292  lung]
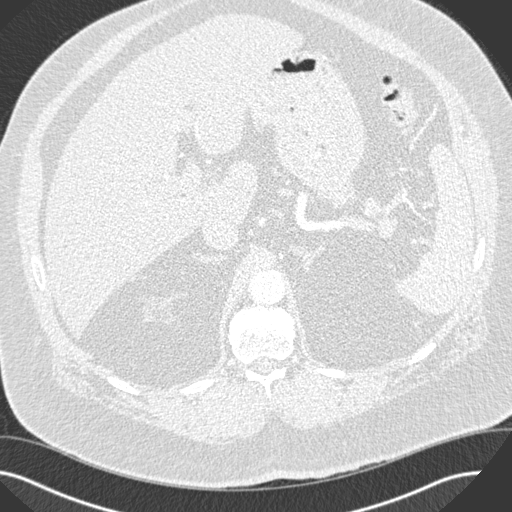
[im 33/292  mediastinal]
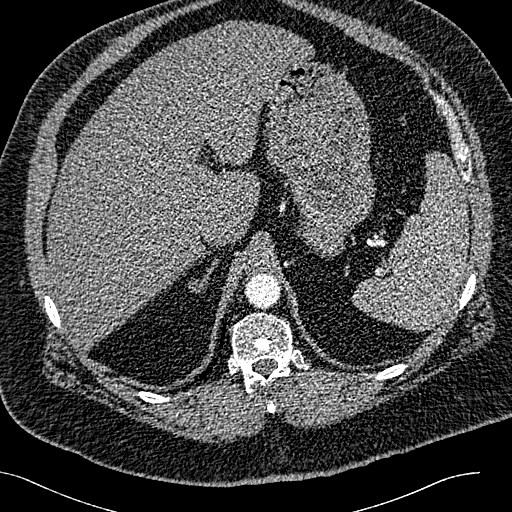
[im 49/292  lung]
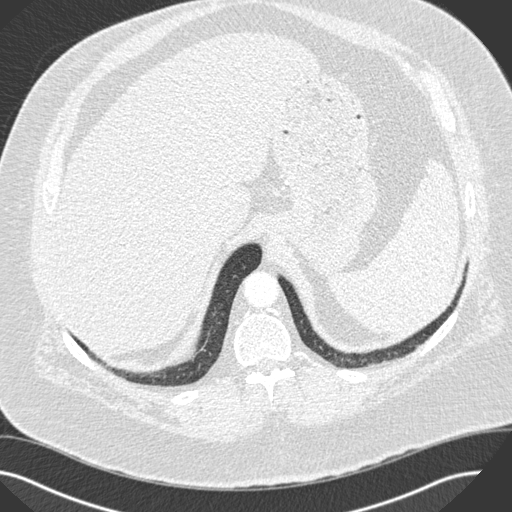
[im 65/292  mediastinal]
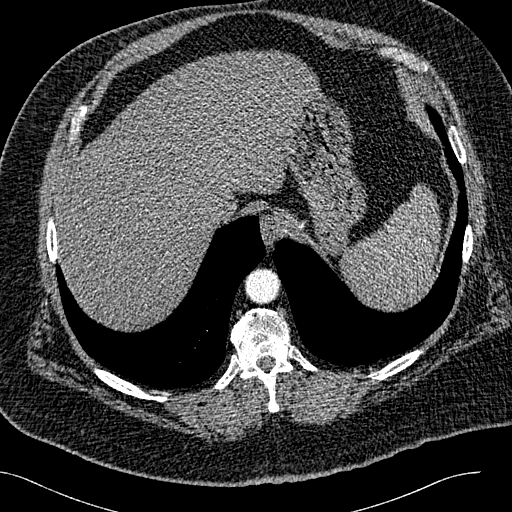
[im 81/292  lung]
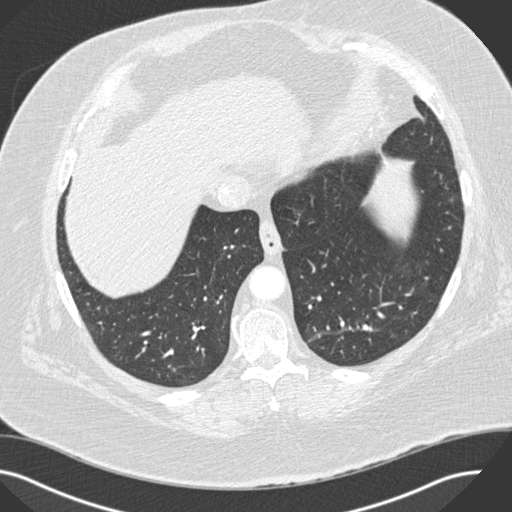
[im 98/292  mediastinal]
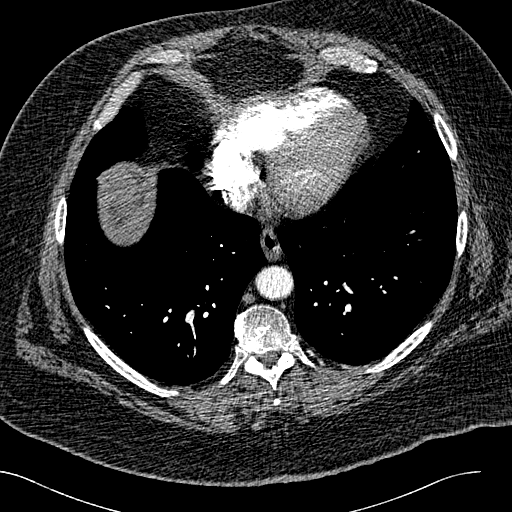
[im 114/292  lung]
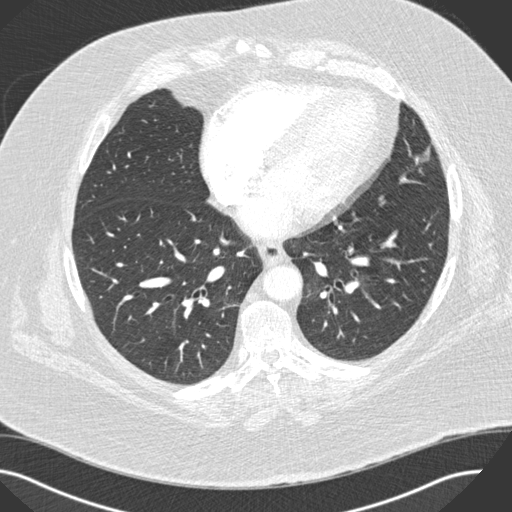
[im 130/292  mediastinal]
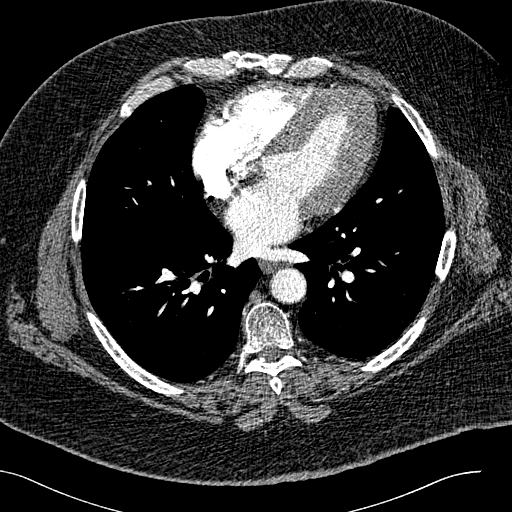
[im 137/292  lung]
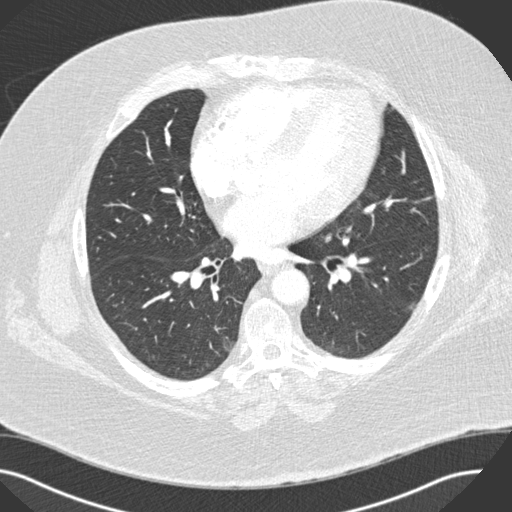
[im 146/292  mediastinal]
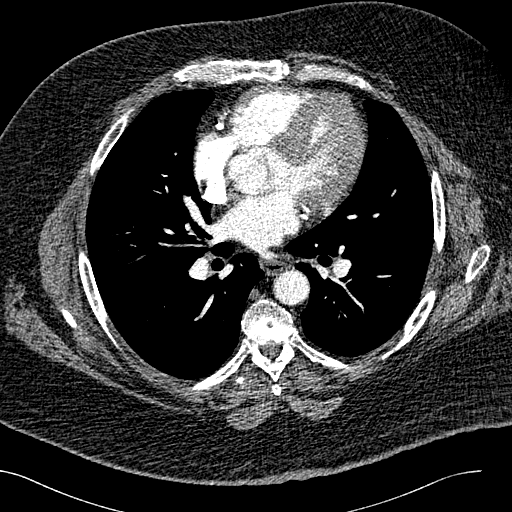
[im 162/292  lung]
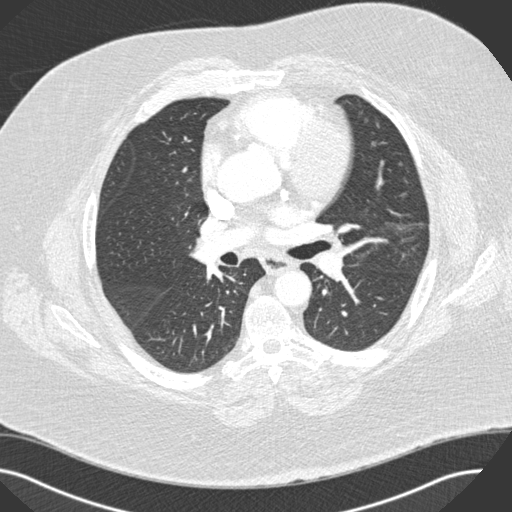
[im 178/292  mediastinal]
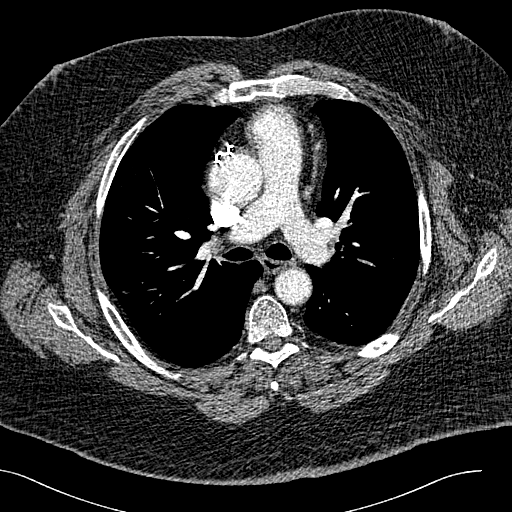
[im 195/292  lung]
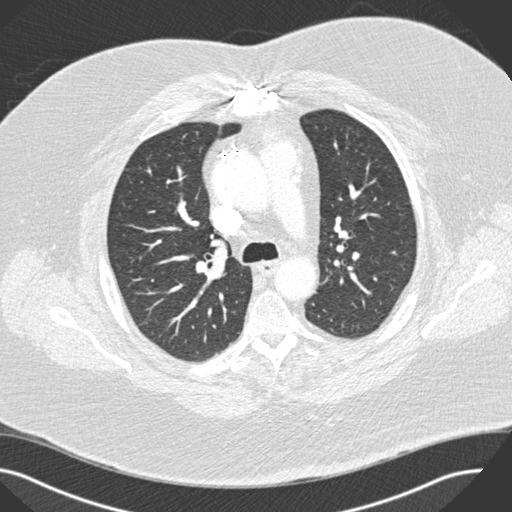
[im 211/292  mediastinal]
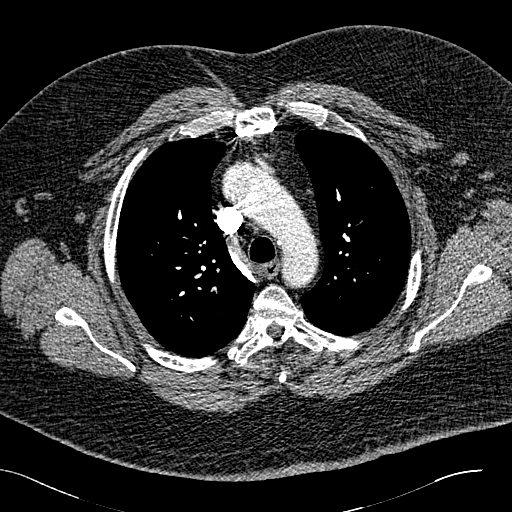
[im 227/292  lung]
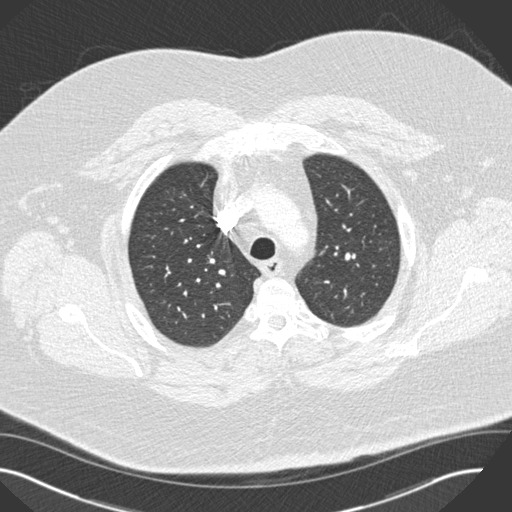
[im 243/292  mediastinal]
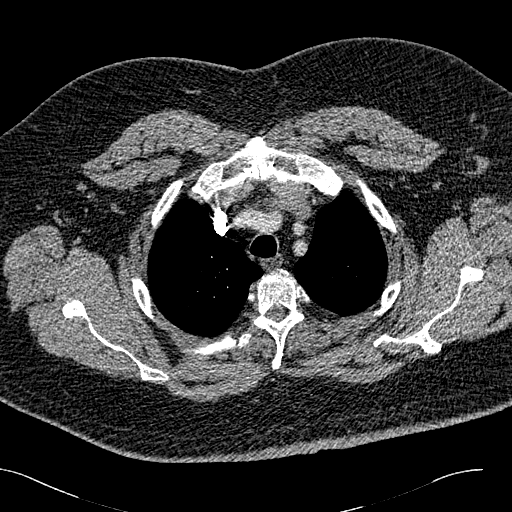
[im 259/292  lung]
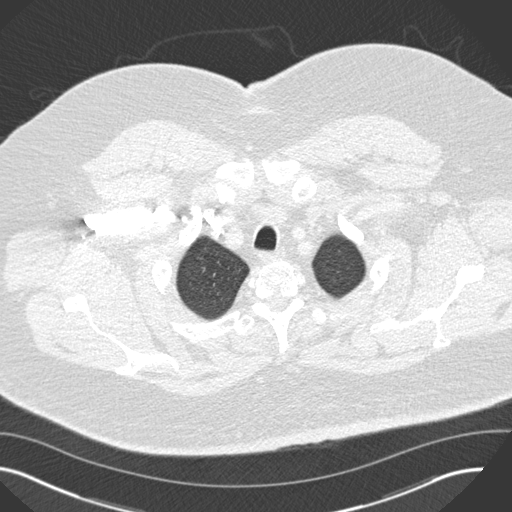
[im 275/292  mediastinal]
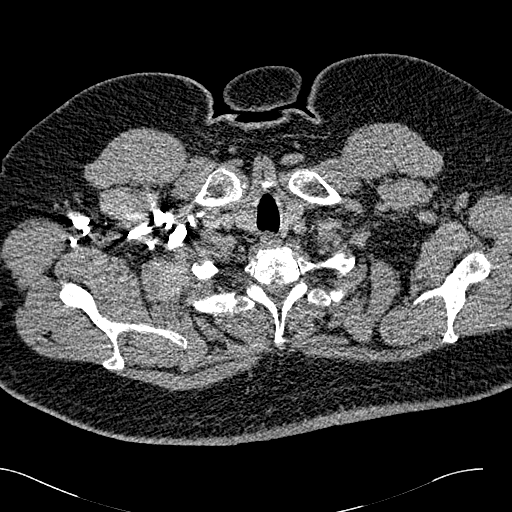

[18 of 30 positions shown; findings below may reference images not displayed]

FINDINGS: There is no evidence for a large or central pulmonary embolism. The
contrast opacification in the upper pulmonary arteries is limited.
No evidence for an embolism in the lower pulmonary arteries. Patient
had a CABG procedure. The ascending thoracic aorta measures up to
4.2 cm. No significant pericardial or pleural fluid. No significant
chest lymphadenopathy. Images of the upper abdomen are unremarkable.

The trachea and mainstem bronchi are patent. There is a small
calcified nodule measuring 5 mm in the medial right middle lobe on
sequence 6, image 52. Two punctate calcifications in the left lower
lobe. No significant consolidation or airspace disease. There are
linear densities in both lungs which could represent areas
atelectasis or mild scarring. No acute bone abnormality.

Review of the MIP images confirms the above findings.
IMPRESSION: No evidence for a large or central pulmonary embolism. Limited
evaluation of the upper pulmonary arteries as described.

No acute chest findings.

Post CABG. Enlargement of the ascending thoracic aorta measuring up
to 4.2 cm.

Small calcified granulomas and consistent with old granulomatous
disease.

## 2015-05-07 MED ORDER — CEPASTAT 14.5 MG MT LOZG
1.0000 | LOZENGE | OROMUCOSAL | Status: DC | PRN
  Filled 2015-05-07: qty 9

## 2015-05-07 MED ORDER — POTASSIUM CHLORIDE 20 MEQ PO PACK
40.0000 meq | PACK | Freq: Once | ORAL | Status: AC
  Administered 2015-05-07: 40 meq via ORAL
  Filled 2015-05-07: qty 2

## 2015-05-07 MED ORDER — IPRATROPIUM-ALBUTEROL 0.5-2.5 (3) MG/3ML IN SOLN: 3.0000 mL | Freq: Four times a day (QID) | RESPIRATORY_TRACT | Status: DC

## 2015-05-07 MED ORDER — IPRATROPIUM-ALBUTEROL 0.5-2.5 (3) MG/3ML IN SOLN
3.0000 mL | RESPIRATORY_TRACT | Status: DC | PRN
  Administered 2015-05-07: 3 mL via RESPIRATORY_TRACT
  Filled 2015-05-07: qty 3

## 2015-05-07 NOTE — Progress Notes (Signed)
[facility] [place] NAME: Marcus Williams    MR#:  [id number]  DATE OF BIRTH:  09-May-1956  SUBJECTIVE:  CHIEF COMPLAINT:   Chief Complaint  Patient presents with  . Leg Swelling  . Chills   Pain is S/P amputation of rt gt toe 11/20 .  Doing fine today but reporting cough and sorethroat  REVIEW OF SYSTEMS:   CONSTITUTIONAL: No fever, positive for fatigue or weakness.  EYES: No blurred or double vision.  EARS, NOSE, AND THROAT: No tinnitus or ear pain.  RESPIRATORY: reports cough, no shortness of breath, wheezing or hemoptysis.  CARDIOVASCULAR: No chest pain, orthopnea, edema.  GASTROINTESTINAL: No nausea, vomiting, diarrhea or abdominal pain.  GENITOURINARY: No dysuria, hematuria.  ENDOCRINE: No polyuria, nocturia,  HEMATOLOGY: No anemia, easy bruising or bleeding SKIN: No rash or lesion. MUSCULOSKELETAL:Right foot great toe joint pain or arthritis. He also have pain radiating from his right lower back all the way to his thigh and leg.  NEUROLOGIC: No tingling, numbness, weakness.  PSYCHIATRY: No anxiety or depression.     ROS  DRUG ALLERGIES:  No Known Allergies  VITALS:  Blood pressure 146/65, pulse 78, temperature 98.2 F (36.8 C), temperature source Oral, resp. rate 21, height 5\' 8"  (1.727 m), weight 127.37 kg (280 lb 12.8 oz), SpO2 99 %.  PHYSICAL EXAMINATION:   GENERAL: 59 y.o.-year-old obase patient lying in the bed with no acute distress.  EYES: Pupils equal, round, reactive to light and accommodation. No scleral icterus. Extraocular muscles intact.  HEENT: Head atraumatic, normocephalic. Oropharynx and nasopharynx clear.  NECK: Supple, no jugular venous distention. No thyroid enlargement, no tenderness.  LUNGS: Normal breath sounds bilaterally, no wheezing, rales,rhonchi or crepitation. No use of accessory muscles of respiration.  CARDIOVASCULAR: S1, S2 normal. No murmurs, rubs, or gallops.   ABDOMEN: Soft, nontender, nondistended. Bowel sounds present. No organomegaly or mass.  EXTREMITIES: No pedal edema, cyanosis, or clubbing on left leg. On the right leg swelling mild redness and warm to touch-  s/p right great toe amputated , in dresssing,  NEUROLOGIC: Cranial nerves II through XII are intact. Muscle strength 5/5 in all extremities. Sensation intact. Gait not checked. He is able to move his right lower extremity but it causes him severe pain in his right lower back.  PSYCHIATRIC: The patient is alert and oriented x 3.  SKIN: No obvious rash, lesion, or ulcer.    Physical Exam LABORATORY PANEL:   CBC  Recent Labs Lab 05/07/15 0417  WBC 7.5  HGB 10.0*  HCT 30.4*  PLT 179   ------------------------------------------------------------------------------------------------------------------  Chemistries   Recent Labs Lab 05/04/15 1653  05/07/15 0417  NA 138  < > 137  K 3.7  < > 3.4*  CL 100*  < > 102  CO2 28  < > 27  GLUCOSE 147*  < > 108*  BUN 19  < > 12  CREATININE 1.10  < > 0.91  CALCIUM 9.3  < > 8.3*  AST 26  --   --   ALT 27  --   --   ALKPHOS 66  --   --   BILITOT 0.6  --   --   < > = values in this interval not displayed. ------------------------------------------------------------------------------------------------------------------  Cardiac Enzymes No results for input(s): TROPONINI in the last 168 hours. ------------------------------------------------------------------------------------------------------------------  RADIOLOGY:  Mr Foot Right W Wo Contrast  05/06/2015  CLINICAL DATA:  Two or 3 week history of pain,  swelling and erythema of the great toe with ulceration and drainage. Evaluate for osteomyelitis. Initial encounter. EXAM: MRI OF THE RIGHT FOREFOOT WITHOUT AND WITH CONTRAST TECHNIQUE: Multiplanar, multisequence MR imaging was performed both before and after administration of intravenous contrast. CONTRAST:  39mL MULTIHANCE  GADOBENATE DIMEGLUMINE 529 MG/ML IV SOLN COMPARISON:  Radiographs 05/04/2015. FINDINGS: Despite efforts by the technologist and patient, significant motion artifact is present on today's exam and could not be eliminated. This reduces exam sensitivity and specificity. There is soft tissue T2 hyperintensity throughout the forefoot, greatest medially. Post-contrast images demonstrate diffuse soft tissue enhancement throughout the great toe. Plantar to the distal phalanx, there is a nonenhancing 10 x 21 x 23 mm focus of T2 hyperintensity which probably reflects a superficial abscess. No other focal fluid collections are identified. There is susceptibility artifact in the plantar aspect of the midfoot near the base of the first metatarsal, likely secondary to an incidental metallic foreign body, not imaged on recent radiographs. Patient did have a tiny metallic foreign body more laterally in the midfoot on 2009 radiographs. There is nonspecific T2 hyperintensity and enhancement within the distal phalanx of the great toe which may be related to the tuftal fracture seen on today's radiographs. No cortical destruction or more proximal marrow changes are identified to strongly suggest osteomyelitis. The proximal phalanx and first metatarsal appear normal. The additional toes and metatarsals appear normal. IMPRESSION: 1. Study is significantly motion degraded, limiting sensitivity and specificity. 2. Inflammatory changes in the great toe with probable small superficial abscess plantar to the distal phalanx. 3. Nonspecific bone marrow edema and enhancement within the distal phalanx of the great toe. These marrow changes could be related to the known fracture and do not necessarily imply superimposed osteomyelitis. No cortical destruction identified. 4. Suspected metallic foreign body within the plantar aspect of the midfoot near the base of the first metatarsal with resulting susceptibility artifact. In discussion with Dr.  Cleda Mccreedy, the patient does not have any inflammatory change in this area to suggest this represents soft tissue emphysema. Electronically Signed   By: Richardean Sale M.D.   On: 05/06/2015 12:42    ASSESSMENT AND PLAN:   Principal Problem:   Cellulitis  * Right great toe wounds and leg cellulitis with abscess  was on IV vancomycin and Zosyn. Wound cx with gm pos cocci and gm neg rods- staph pan sensitive Continue zosyn and d/c vanc Podiatry consult appreciated-  DVT studies are negative done 2 days ago in ER.    * Right lower back pain with radiation up to the leg Most likely this sciatica negative CT scan of the lumbar spine  * Diabetes Continue his oral medications but   decrease slightly on his NPH doses     on sliding scale coverage .   Blood sugar is under control.  * Atrial fibrillation Rate under control currently continue metoprolol, Cardizem.  on therapeutic lovenox, will resume xarelto if ok with podiatry    * Coronary artery disease Continue aspirin,   on full dose Lovenox,    continue metoprolol and statin.  * COPD No active wheezing continue home inhalers and nebulizers.  *cough Cxr/is/cepachol prn     All the records are reviewed and case discussed with Care Management/Social Workerr. Management plans discussed with the patient, family and they are in agreement.  CODE STATUS: full  TOTAL TIME TAKING CARE OF THIS PATIENT: 35 minutes.   POSSIBLE D/C IN 2-3 DAYS, DEPENDING ON CLINICAL CONDITION.   Kaiyden Simkin,  Alger Kerstein M.D on 05/07/2015   Between 7am to 6pm - Pager - [phone]  After 6pm go to [email or web address] - password [email or web address] [facility]  Office  [phone]  CC: Primary care physician; Idelle Crouch, MD  Note: This dictation was prepared with Dragon dictation along with smaller phrase technology. Any transcriptional errors that result from this process are unintentional.

## 2015-05-07 NOTE — Progress Notes (Addendum)
Pt requesting duoneb for wheezing. Dr Marcille Blanco notified, order obtained Duonebs every 4 hours prn

## 2015-05-07 NOTE — Progress Notes (Signed)
1 Day Post-Op  Subjective: Patient seen. Some pain last night when the anesthesia wore off but overall doing fairly well today.  Objective: Vital signs in last 24 hours: Temp:  [97.4 F (36.3 C)-98.4 F (36.9 C)] 98.4 F (36.9 C) (11/21 0729) Pulse Rate:  [66-82] 82 (11/21 0731) Resp:  [15-22] 21 (11/21 0729) BP: (137-170)/(41-76) 137/42 mmHg (11/21 0731) SpO2:  [93 %-99 %] 95 % (11/21 0731) Last BM Date: 05/05/15  Intake/Output from previous day: 11/20 0701 - 11/21 0700 In: 595 [P.O.:320; I.V.:275] Out: 1800 [Urine:1800] Intake/Output this shift:    The bandages dry and intact. Upon removal there is minimal bleeding on the bandaging. The incision is well coapted with no purulence. Mild erythema around the amputation site.  Lab Results:   Recent Labs  05/05/15 0425 05/07/15 0417  WBC 11.5* 7.5  HGB 10.0* 10.0*  HCT 31.8* 30.4*  PLT 179 179   BMET  Recent Labs  05/05/15 0425 05/06/15 0932 05/07/15 0417  NA 137  --  137  K 3.4*  --  3.4*  CL 101  --  102  CO2 27  --  27  GLUCOSE 160*  --  108*  BUN 19  --  12  CREATININE 1.15 1.03 0.91  CALCIUM 8.4*  --  8.3*   PT/INR No results for input(s): LABPROT, INR in the last 72 hours. ABG No results for input(s): PHART, HCO3 in the last 72 hours.  Invalid input(s): PCO2, PO2  Studies/Results: Mr Foot Right W Wo Contrast  05/06/2015  CLINICAL DATA:  Two or 3 week history of pain, swelling and erythema of the great toe with ulceration and drainage. Evaluate for osteomyelitis. Initial encounter. EXAM: MRI OF THE RIGHT FOREFOOT WITHOUT AND WITH CONTRAST TECHNIQUE: Multiplanar, multisequence MR imaging was performed both before and after administration of intravenous contrast. CONTRAST:  60mL MULTIHANCE GADOBENATE DIMEGLUMINE 529 MG/ML IV SOLN COMPARISON:  Radiographs 05/04/2015. FINDINGS: Despite efforts by the technologist and patient, significant motion artifact is present on today's exam and could not be eliminated.  This reduces exam sensitivity and specificity. There is soft tissue T2 hyperintensity throughout the forefoot, greatest medially. Post-contrast images demonstrate diffuse soft tissue enhancement throughout the great toe. Plantar to the distal phalanx, there is a nonenhancing 10 x 21 x 23 mm focus of T2 hyperintensity which probably reflects a superficial abscess. No other focal fluid collections are identified. There is susceptibility artifact in the plantar aspect of the midfoot near the base of the first metatarsal, likely secondary to an incidental metallic foreign body, not imaged on recent radiographs. Patient did have a tiny metallic foreign body more laterally in the midfoot on 2009 radiographs. There is nonspecific T2 hyperintensity and enhancement within the distal phalanx of the great toe which may be related to the tuftal fracture seen on today's radiographs. No cortical destruction or more proximal marrow changes are identified to strongly suggest osteomyelitis. The proximal phalanx and first metatarsal appear normal. The additional toes and metatarsals appear normal. IMPRESSION: 1. Study is significantly motion degraded, limiting sensitivity and specificity. 2. Inflammatory changes in the great toe with probable small superficial abscess plantar to the distal phalanx. 3. Nonspecific bone marrow edema and enhancement within the distal phalanx of the great toe. These marrow changes could be related to the known fracture and do not necessarily imply superimposed osteomyelitis. No cortical destruction identified. 4. Suspected metallic foreign body within the plantar aspect of the midfoot near the base of the first metatarsal with resulting  susceptibility artifact. In discussion with Dr. Cleda Mccreedy, the patient does not have any inflammatory change in this area to suggest this represents soft tissue emphysema. Electronically Signed   By: Richardean Sale M.D.   On: 05/06/2015 12:42   US Venous Img Lower  Unilateral Right  05/05/2015  CLINICAL DATA:  Right lower extremity pain and swelling for 3 weeks. EXAM: RIGHT LOWER EXTREMITY VENOUS DOPPLER ULTRASOUND TECHNIQUE: Gray-scale sonography with graded compression, as well as color Doppler and duplex ultrasound were performed to evaluate the lower extremity deep venous systems from the level of the common femoral vein and including the common femoral, femoral, profunda femoral, popliteal and calf veins including the posterior tibial, peroneal and gastrocnemius veins when visible. The superficial great saphenous vein was also interrogated. Spectral Doppler was utilized to evaluate flow at rest and with distal augmentation maneuvers in the common femoral, femoral and popliteal veins. COMPARISON:  None. FINDINGS: Contralateral Common Femoral Vein: Respiratory phasicity is normal and symmetric with the symptomatic side. No evidence of thrombus. Normal compressibility. Common Femoral Vein: No evidence of thrombus. Normal compressibility, respiratory phasicity and response to augmentation. Saphenofemoral Junction: No evidence of thrombus. Normal compressibility and flow on color Doppler imaging. Profunda Femoral Vein: No evidence of thrombus. Normal compressibility and flow on color Doppler imaging. Femoral Vein: No evidence of thrombus. Normal compressibility, respiratory phasicity and response to augmentation. Popliteal Vein: No evidence of thrombus. Normal compressibility, respiratory phasicity and response to augmentation. Calf Veins: No evidence of thrombus. Normal compressibility and flow on color Doppler imaging. Superficial Great Saphenous Vein: No evidence of thrombus. Normal compressibility and flow on color Doppler imaging. Venous Reflux:  None. Other Findings:  None. IMPRESSION: No evidence of deep venous thrombosis. Electronically Signed   By: Lajean Manes M.D.   On: 05/05/2015 13:42    Anti-infectives: Anti-infectives    Start     Dose/Rate Route  Frequency Ordered Stop   05/05/15 0000  piperacillin-tazobactam (ZOSYN) IVPB 4.5 g     4.5 g 25 mL/hr over 4 Hours Intravenous 3 times per day 05/04/15 2030     05/04/15 2330  vancomycin (VANCOCIN) 1,250 mg in sodium chloride 0.9 % 250 mL IVPB     1,250 mg 166.7 mL/hr over 90 Minutes Intravenous Every 8 hours 05/04/15 2000     05/04/15 1715  vancomycin (VANCOCIN) IVPB 1000 mg/200 mL premix     1,000 mg 200 mL/hr over 60 Minutes Intravenous  Once 05/04/15 1714 05/04/15 1904   05/04/15 1715  piperacillin-tazobactam (ZOSYN) IVPB 3.375 g     3.375 g 12.5 mL/hr over 240 Minutes Intravenous  Once 05/04/15 1714 05/04/15 1804      Assessment/Plan: s/p Procedure(s): AMPUTATION TOE (Right) Assessment: Abscess right great toe status post amputation   Plan: Betadine applied to the incision line followed by a sterile bandage. Patient will keep his bandage clean dry and intact. We'll plan on follow-up outpatient next week for evaluation. Should be stable for discharge on appropriate antibiotics in the next 1-2 days  LOS: 3 days    Adessa Primiano W. 05/07/2015

## 2015-05-07 NOTE — Progress Notes (Signed)
Pt dealing with headache, hip pain/muscle spasms.  No c/o pain in the foot.  Says he is coughing and starting to get a scratchy throat.  MD notified because pt said the Dr mentioned ordering a chest x ray.  MD has not ordered a xray at this time

## 2015-05-07 NOTE — Progress Notes (Signed)
Patient refuses to wear because we do not supply nasal pillows

## 2015-05-07 NOTE — Progress Notes (Signed)
Pharmacy Antibiotic Time-Out Note  Marcus Williams is a 59 y.o. year-old male admitted on 05/04/2015.  The patient is currently on vancomycin and zosyn for diabetic foot infection/cellutlis.  Assessment/Plan: After discussion with Dr. Margaretmary Eddy, vancomycin will be discontinued. Wound cx shows light MSSA growth and few gram neg rods. Zosyn will be continued until ID of gram neg rods returns  Temp (24hrs), Avg:97.9 F (36.6 C), Min:97.4 F (36.3 C), Max:98.4 F (36.9 C)   Recent Labs Lab 05/04/15 1653 05/05/15 0425 05/07/15 0417  WBC 14.8* 11.5* 7.5    Recent Labs Lab 05/04/15 1653 05/05/15 0425 05/06/15 0932 05/07/15 0417  CREATININE 1.10 1.15 1.03 0.91   Estimated Creatinine Clearance: 113.7 mL/min (by C-G formula based on Cr of 0.91).   Antimicrobial allergies: none  Antimicrobials this admission: vancomycin 11/18 >> 11/21 zosyn 11/18 >>   Levels/dose changes this admission:   Microbiology Results: 11/19 BCx: neg to date Wound cx: light growth MSSA and few gram neg rods 11//19 MRSA PCR: neg  Thank you for allowing pharmacy to be a part of this patient's care.  )Ramond Dial PharmD 05/07/2015 2:49 PM

## 2015-05-07 NOTE — Progress Notes (Signed)
Patient was made a follow-up appointment at the [facility] on May 21, 2015 at 9:30am. Of note, he did not show for his last 2 appointments.

## 2015-05-08 ENCOUNTER — Encounter: Payer: Self-pay | Admitting: Internal Medicine

## 2015-05-08 ENCOUNTER — Encounter: Payer: Self-pay | Admitting: *Deleted

## 2015-05-08 LAB — WOUND CULTURE

## 2015-05-08 LAB — BASIC METABOLIC PANEL
Anion gap: 9 (ref 5–15)
BUN: 11 mg/dL (ref 6–20)
CHLORIDE: 101 mmol/L (ref 101–111)
CO2: 29 mmol/L (ref 22–32)
Calcium: 8.5 mg/dL — ABNORMAL LOW (ref 8.9–10.3)
Creatinine, Ser: 1 mg/dL (ref 0.61–1.24)
GFR calc Af Amer: >60 mL/min (ref >60)
GFR calc non Af Amer: >60 mL/min (ref >60)
GLUCOSE: 110 mg/dL — AB (ref 65–99)
POTASSIUM: 3.6 mmol/L (ref 3.5–5.1)
SODIUM: 139 mmol/L (ref 135–145)

## 2015-05-08 LAB — CBC
HEMATOCRIT: 33.6 % — AB (ref 40.0–52.0)
HEMOGLOBIN: 10.4 g/dL — AB (ref 13.0–18.0)
MCH: 23.9 pg — ABNORMAL LOW (ref 26.0–34.0)
MCHC: 31.1 g/dL — AB (ref 32.0–36.0)
MCV: 76.8 fL — AB (ref 80.0–100.0)
Platelets: 219 10*3/uL (ref 150–440)
RBC: 4.37 MIL/uL — ABNORMAL LOW (ref 4.40–5.90)
RDW: 17.5 % — ABNORMAL HIGH (ref 11.5–14.5)
WBC: 7.4 10*3/uL (ref 3.8–10.6)

## 2015-05-08 LAB — SURGICAL PATHOLOGY

## 2015-05-08 LAB — MAGNESIUM: MAGNESIUM: 1.8 mg/dL (ref 1.7–2.4)

## 2015-05-08 LAB — GLUCOSE, CAPILLARY: Glucose-Capillary: 119 mg/dL — ABNORMAL HIGH (ref 65–99)

## 2015-05-08 MED ORDER — FUROSEMIDE 10 MG/ML IJ SOLN
20.0000 mg | Freq: Once | INTRAMUSCULAR | Status: AC
  Administered 2015-05-08: 20 mg via INTRAVENOUS
  Filled 2015-05-08: qty 2

## 2015-05-08 MED ORDER — OXYCODONE-ACETAMINOPHEN 5-325 MG PO TABS: 1.0000 | ORAL_TABLET | Freq: Four times a day (QID) | ORAL | Status: DC | PRN

## 2015-05-08 MED ORDER — CIPROFLOXACIN HCL 500 MG PO TABS: 500.0000 mg | ORAL_TABLET | Freq: Two times a day (BID) | ORAL | Status: AC

## 2015-05-08 MED ORDER — ACETAMINOPHEN 325 MG PO TABS: 650.0000 mg | ORAL_TABLET | Freq: Four times a day (QID) | ORAL | Status: DC | PRN

## 2015-05-08 NOTE — Evaluation (Signed)
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 He has a history of R lower back, hip and knee pain as well.   Clinical Impression  Patient is s/p R great toe amputation with minimal pain and no real balance deficits observed during this session different from his baseline. Modified DGI score of 12/12 and supervision level or higher for transfers and ambulation indicate he is roughly at his baseline. Patient does complain of hip and knee pain recently and was recommended to follow up with outpatient PT. Patient would benefit from skilled PT services to address higher level balance and reinforce weight bearing status.     Follow Up Recommendations Outpatient PT (For hip/knee pain and higher level balance)    Equipment Recommendations       Recommendations for Other Services       Precautions / Restrictions Precautions Precautions: None Restrictions Weight Bearing Restrictions: Yes Other Position/Activity Restrictions: Heel touch WBing in surgical shoe on RLE.       Mobility  Bed Mobility               General bed mobility comments: Did not assess patient received in chair.   Transfers Overall transfer level: Independent               General transfer comment: No deficits or loss of balance noted.   Ambulation/Gait Ambulation/Gait assistance: Independent Ambulation Distance (Feet): 225 Feet   Gait Pattern/deviations: WFL(Within Functional Limits)   Gait velocity interpretation: at or above normal speed for age/gender General Gait Details: Appropriate WBing through RLE at all times, he occassionally holds on to wall for extra balance, no loss of balance during this session.   Stairs Stairs: Yes Stairs assistance: Modified independent  (Device/Increase time) Stair Management: Two rails;Step to pattern Number of Stairs: 4 General stair comments: Cuing for sequencing, no balance deficits on stairs noted.   Wheelchair Mobility    Modified Rankin (Stroke Patients Only)       Balance Overall balance assessment: Independent (Modified DGI 12/12)                                           Pertinent Vitals/Pain Pain Assessment:  (Pt reports no increase in pain with ambulation, mild knee/hip pain alleviated with muscle relaxers he states.)    Home Living Family/patient expects to be discharged to:: Private residence Living Arrangements: Spouse/significant other Available Help at Discharge: Family Type of Home: House Home Access: Stairs to enter Entrance Stairs-Rails: Can reach both Entrance Stairs-Number of Steps: 3 Home Layout: One level        Prior Function Level of Independence: Independent         Comments: Patient reports no falls. No DME at home.      Hand Dominance        Extremity/Trunk Assessment   Upper Extremity Assessment: Overall WFL for tasks assessed           Lower Extremity Assessment: Overall WFL for tasks assessed         Communication   Communication: No difficulties  Cognition Arousal/Alertness: Awake/alert Behavior During Therapy: WFL for tasks assessed/performed Overall Cognitive Status: Within  Functional Limits for tasks assessed                      General Comments General comments (skin integrity, edema, etc.): Dressing and surgical shoe in place on RLE.     Exercises        Assessment/Plan    PT Assessment Patient needs continued PT services  PT Diagnosis Difficulty walking   PT Problem List Pain;Decreased balance  PT Treatment Interventions Therapeutic activities;Therapeutic exercise;Gait training;Stair training;Balance training   PT Goals (Current goals can be found in the Care Plan section) Acute Rehab PT Goals Patient  Stated Goal: To go home to see his dog.  PT Goal Formulation: With patient Time For Goal Achievement: 05/22/15 Potential to Achieve Goals: Good Additional Goals Additional Goal #1:  (Pt will demonstrate low falls risk on Berg Balance Test)    Frequency 7X/week   Barriers to discharge        Co-evaluation               End of Session Equipment Utilized During Treatment: Gait belt Activity Tolerance: Patient tolerated treatment well Patient left: in chair;with call bell/phone within reach Nurse Communication: Mobility status         Time: 1118-1130 PT Time Calculation (min) (ACUTE ONLY): 12 min   Charges:   PT Evaluation $Initial PT Evaluation Tier I: 1 Procedure     PT G Codes:       Kerman Passey, PT, DPT    1Jan 11, 202016, 1:46 PM

## 2015-05-08 NOTE — Care Management (Signed)
Met with patient and he denies need for DME and Home Health PT. I advised patient to obtain a cane from his choice of drug stores or medical supply companies- he agrees. No further RNCM needs. Case closed.

## 2015-05-08 NOTE — Discharge Summary (Signed)
[facility] [place] NAME: Marcus Williams    MR#:  [id number]  DATE OF BIRTH:  [birth year]  DATE OF ADMISSION:  05/04/2015 ADMITTING PHYSICIAN: Vaughan Basta, MD  DATE OF DISCHARGE: 108/23/2016 PRIMARY CARE PHYSICIAN: SPARKS,JEFFREY D, MD    ADMISSION DIAGNOSIS:  Swelling [R60.9] Necrosis ([place]) [I96] Cellulitis of toe, right [L03.031] Sciatica, right [M54.31] Diabetic ulcer of right foot associated with type 2 diabetes mellitus ([place]) [id number], L97.519] Sciatic leg pain [M54.30]  DISCHARGE DIAGNOSIS:  Principal Problem:   Cellulitis   SECONDARY DIAGNOSIS:   Past Medical History  Diagnosis Date  . MI (myocardial infarction) ([place])   . Sepsis ([place])   . History of cardioversion   . Diabetes mellitus without complication ([place])   . Hypertension   . CAD (coronary artery disease)   . SVT (supraventricular tachycardia) ([place])   . OSA on CPAP   . A-fib ([place])   . Chronic systolic CHF (congestive heart failure) [facility])     HOSPITAL COURSE:   * Right great toe wounds and leg cellulitis with abscess was on IV vancomycin and Zosyn. Wound cx with gm pos cocci and gm neg rods- staph pan sensitive Continued  zosyn and  vanc during hospital course Podiatry consult appreciated- Recommended to d/c pt with po cipro and op f/u in a week DVT studies are negative done 2 days ago in ER.  * Right lower back pain with radiation up to the leg Most likely this sciatica negative CT scan of the lumbar spine  * Diabetes Continue his oral medications but adjusted his NPH doses   Provided  sliding scale coverage during hospital course .  Blood sugar is under control.  * Atrial fibrillation Rate under control currently continue metoprolol, Cardizem. on therapeutic lovenox during hospital course and  resumed xarelto at d/c   * Coronary artery disease Continue aspirin,   continue metoprolol and statin.  * COPD No  active wheezing continue home inhalers and nebulizers.  *cough - resolved   DISCHARGE CONDITIONS:   Fair  CONSULTS OBTAINED:  Treatment Team:  Sharlotte Alamo, MD [name], MD   PROCEDURES Rt great toe amputation   DRUG ALLERGIES:  No Known Allergies  DISCHARGE MEDICATIONS:   Current Discharge Medication List    START taking these medications   Details  acetaminophen (TYLENOL) 325 MG tablet Take 2 tablets (650 mg total) by mouth every 6 (six) hours as needed for fever.    ciprofloxacin (CIPRO) 500 MG tablet Take 1 tablet (500 mg total) by mouth 2 (two) times daily. Qty: 20 tablet, Refills: 0    oxyCODONE-acetaminophen (PERCOCET/ROXICET) 5-325 MG tablet Take 1-2 tablets by mouth every 6 (six) hours as needed for moderate pain or severe pain. Qty: 5 tablet, Refills: 0      CONTINUE these medications which have NOT CHANGED   Details  albuterol (PROVENTIL HFA;VENTOLIN HFA) 108 (90 BASE) MCG/ACT inhaler Inhale 2 puffs into the lungs every 6 (six) hours as needed for wheezing or shortness of breath. Qty: 1 Inhaler, Refills: 0    ALPRAZolam (XANAX) 0.25 MG tablet Take 0.25 mg by mouth 2 (two) times daily as needed for anxiety.    aspirin EC 325 MG tablet Take 325 mg by mouth daily.    diltiazem (CARDIZEM CD) 300 MG 24 hr capsule Take 1 capsule (300 mg total) by mouth daily. Qty: 30 capsule, Refills: 5    fluticasone (FLONASE) 50 MCG/ACT nasal spray Place 1 spray into  both nostrils daily.    Fluticasone-Salmeterol (ADVAIR DISKUS) 250-50 MCG/DOSE AEPB Inhale 1 puff into the lungs 2 (two) times daily.    furosemide (LASIX) 40 MG tablet Take 1 tablet (40 mg total) by mouth 2 (two) times daily. Qty: 60 tablet, Refills: 5    glimepiride (AMARYL) 4 MG tablet Take 4 mg by mouth daily.    insulin NPH Human (HUMULIN N,NOVOLIN N) 100 UNIT/ML injection Inject 40 Units into the skin 2 (two) times daily.    ipratropium-albuterol (DUONEB) 0.5-2.5 (3) MG/3ML SOLN Take 3 mLs by  nebulization every 4 (four) hours as needed (for shortness of breath).    metFORMIN (GLUCOPHAGE) 1000 MG tablet Take 1,000 mg by mouth 2 (two) times daily with a meal.    metoprolol tartrate (LOPRESSOR) 25 MG tablet Take 1 tablet (25 mg total) by mouth 2 (two) times daily. Qty: 60 tablet, Refills: 5    nitroGLYCERIN (NITROSTAT) 0.4 MG SL tablet Place 0.4 mg under the tongue every 5 (five) minutes as needed for chest pain.    pramipexole (MIRAPEX) 0.5 MG tablet Take 0.5 mg by mouth at bedtime.    pregabalin (LYRICA) 75 MG capsule Take 75 mg by mouth 2 (two) times daily.    simvastatin (ZOCOR) 40 MG tablet Take 40 mg by mouth at bedtime.     traZODone (DESYREL) 100 MG tablet Take 100 mg by mouth at bedtime.    XARELTO 15 MG TABS tablet Take 15 mg by mouth every evening.       STOP taking these medications     pantoprazole (PROTONIX) 40 MG tablet      methylPREDNISolone (MEDROL DOSEPAK) 4 MG TBPK tablet          DISCHARGE INSTRUCTIONS:   Heart Failure Clinic appointment on May 21, 2015 at 9:30am with Darylene Price, [place]. Please call [phone] to reschedule.  Follow-up with podiatry Dr. Caryl Comes in a week Follow-up with outpatient diabetic t clinic in a week Follow-up with primary care physician Dr. Doy Hutching in a week Activity as tolerated as recommended by physical therapy Wound care by podiatry Diet healthy heart, diabetic Monitor daily weights and intake and output, restrict fluid intake   DIET:  Heart healthy, diabetic diet  DISCHARGE CONDITION:  fair  ACTIVITY:  Activity as tolerated as recommended by physical therapy  OXYGEN:  Home Oxygen: no   Oxygen Delivery: RA  DISCHARGE LOCATION:  HOME with homehealth  If you experience worsening of your admission symptoms, develop shortness of breath, life threatening emergency, suicidal or homicidal thoughts you must seek medical attention immediately by calling 911 or calling your MD immediately  if symptoms less  severe.  You Must read complete instructions/literature along with all the possible adverse reactions/side effects for all the Medicines you take and that have been prescribed to you. Take any new Medicines after you have completely understood and accpet all the possible adverse reactions/side effects.   Please note  You were cared for by a hospitalist during your hospital stay. If you have any questions about your discharge medications or the care you received while you were in the hospital after you are discharged, you can call the unit and asked to speak with the hospitalist on call if the hospitalist that took care of you is not available. Once you are discharged, your primary care physician will handle any further medical issues. Please note that NO REFILLS for any discharge medications will be authorized once you are discharged, as it is imperative that you  return to your primary care physician (or establish a relationship with a primary care physician if you do not have one) for your aftercare needs so that they can reassess your need for medications and monitor your lab values.     Today  Chief Complaint  Patient presents with  . Leg Swelling  . Chills   Pt is feeling fine,, ambulating well with orthotic shoe, requesting to d/c hime home with home health  ROS:  CONSTITUTIONAL: Denies fevers, chills. Denies any fatigue, weakness.  EYES: Denies blurry vision, double vision, eye pain. EARS, NOSE, THROAT: Denies tinnitus, ear pain, hearing loss. RESPIRATORY: Denies cough, wheeze, shortness of breath.  CARDIOVASCULAR: Denies chest pain, palpitations, edema.  GASTROINTESTINAL: Denies nausea, vomiting, diarrhea, abdominal pain. Denies bright red blood per rectum. GENITOURINARY: Denies dysuria, hematuria. ENDOCRINE: Denies nocturia or thyroid problems. HEMATOLOGIC AND LYMPHATIC: Denies easy bruising or bleeding. SKIN: Denies rash or lesion. MUSCULOSKELETAL: Denies pain in neck, back,  shoulder, knees, hips or arthritic symptoms. Rt gt toe amputated , foot in clean dressing , changed by podiatry NEUROLOGIC: Denies paralysis, paresthesias.  PSYCHIATRIC: Denies anxiety or depressive symptoms.   VITAL SIGNS:  Blood pressure 149/63, pulse 72, temperature 98.2 F (36.8 C), temperature source Oral, resp. rate 18, height 5\' 8"  (1.727 m), weight 127.37 kg (280 lb 12.8 oz), SpO2 98 %.  I/O:   Intake/Output Summary (Last 24 hours) at 05/08/15 0949 Last data filed at 05/08/15 0935  Gross per 24 hour  Intake    600 ml  Output   2820 ml  Net  -2220 ml    PHYSICAL EXAMINATION:  GENERAL:  59 y.o.-year-old patient lying in the bed with no acute distress.  EYES: Pupils equal, round, reactive to light and accommodation. No scleral icterus. Extraocular muscles intact.  HEENT: Head atraumatic, normocephalic. Oropharynx and nasopharynx clear.  NECK:  Supple, no jugular venous distention. No thyroid enlargement, no tenderness.  LUNGS: Normal breath sounds bilaterally, no wheezing, rales,rhonchi or crepitation. No use of accessory muscles of respiration.  CARDIOVASCULAR: S1, S2 normal. No murmurs, rubs, or gallops.  ABDOMEN: Soft, non-tender, non-distended. Bowel sounds present. No organomegaly or mass.  EXTREMITIES: Rt gt toe amputated , foot in clean dressing , changed by podiatry  No pedal edema, cyanosis, or clubbing.  NEUROLOGIC: Cranial nerves II through XII are intact. Muscle strength 5/5 in all extremities. Sensation intact. Gait - ambulating with rt orthotic shoe PSYCHIATRIC: The patient is alert and oriented x 3.  SKIN: No obvious rash, lesion, or ulcer.   DATA REVIEW:   CBC  Recent Labs Lab 05/08/15 0538  WBC 7.4  HGB 10.4*  HCT 33.6*  PLT 219    Chemistries   Recent Labs Lab 05/04/15 1653  05/08/15 0538  NA 138  < > 139  K 3.7  < > 3.6  CL 100*  < > 101  CO2 28  < > 29  GLUCOSE 147*  < > 110*  BUN 19  < > 11  CREATININE 1.10  < > 1.00  CALCIUM 9.3  <  > 8.5*  MG  --   --  1.8  AST 26  --   --   ALT 27  --   --   ALKPHOS 66  --   --   BILITOT 0.6  --   --   < > = values in this interval not displayed.  Cardiac Enzymes No results for input(s): TROPONINI in the last 168 hours.  Microbiology Results  Results  for orders placed or performed during the hospital encounter of 05/04/15  Wound culture     Status: None (Preliminary result)   Collection Time: 05/05/15  2:52 PM  Result Value Ref Range Status   Specimen Description ABSCESS  Final   Special Requests NONE  Final   Gram Stain   Final    FEW WBC SEEN FEW GRAM POSITIVE COCCI FEW GRAM NEGATIVE RODS    Culture LIGHT GROWTH STAPHYLOCOCCUS AUREUS  Final   Report Status PENDING  Incomplete   Organism ID, Bacteria STAPHYLOCOCCUS AUREUS  Final      Susceptibility   Staphylococcus aureus - MIC*    CIPROFLOXACIN <=0.5 SENSITIVE Sensitive     GENTAMICIN <=0.5 SENSITIVE Sensitive     OXACILLIN 0.5 SENSITIVE Sensitive     TRIMETH/SULFA <=10 SENSITIVE Sensitive     CEFOXITIN SCREEN NEGATIVE Sensitive     Inducible Clindamycin NEGATIVE Sensitive     TETRACYCLINE Value in next row Sensitive      SENSITIVE<=1    * LIGHT GROWTH STAPHYLOCOCCUS AUREUS  Surgical pcr screen     Status: Abnormal   Collection Time: 05/05/15  3:07 PM  Result Value Ref Range Status   MRSA, PCR NEGATIVE NEGATIVE Final   Staphylococcus aureus POSITIVE (A) NEGATIVE Final    Comment:        The Xpert SA Assay (FDA approved for NASAL specimens in patients over 6 years of age), is one component of a comprehensive surveillance program.  Test performance has been validated by [facility] for patients greater than or equal to 65 year old. It is not intended to diagnose infection nor to guide or monitor treatment.   Culture, blood (routine x 2)     Status: None (Preliminary result)   Collection Time: 05/05/15 10:22 PM  Result Value Ref Range Status   Specimen Description BLOOD LEFT ASSIST CONTROL  Final    Special Requests BOTTLES DRAWN AEROBIC AND ANAEROBIC 4CC  Final   Culture NO GROWTH < 12 HOURS  Final   Report Status PENDING  Incomplete  Culture, blood (routine x 2)     Status: None (Preliminary result)   Collection Time: 05/05/15 10:31 PM  Result Value Ref Range Status   Specimen Description BLOOD RIGHT ASSIST CONTROL  Final   Special Requests BOTTLES DRAWN AEROBIC AND ANAEROBIC 3CC  Final   Culture NO GROWTH < 12 HOURS  Final   Report Status PENDING  Incomplete    RADIOLOGY:  Dg Chest 2 View  05/07/2015  CLINICAL DATA:  59 year old male with shortness of breath on exertion EXAM: CHEST  2 VIEW COMPARISON:  Prior chest x-ray 04/10/2015 FINDINGS: Stable cardiomegaly. Mediastinal contours remain unchanged. 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 Mild vascular congestion without overt pulmonary edema. No focal airspace consolidation, pleural effusion or pneumothorax. No pulmonary mass or nodule. No acute osseous abnormality. IMPRESSION: Stable cardiomegaly and pulmonary vascular congestion without overt edema. Electronically Signed   By: Jacqulynn Cadet M.D.   On: 05/07/2015 19:42   Ct Lumbar Spine Wo Contrast  05/04/2015  CLINICAL DATA:  Right leg pain EXAM: CT LUMBAR SPINE WITHOUT CONTRAST TECHNIQUE: Multidetector CT imaging of the lumbar spine was performed without intravenous contrast administration. Multiplanar CT image reconstructions were also generated. COMPARISON:  None. FINDINGS: There are 5 non-rib-bearing vertebral bodies. The alignment of the lumbosacral spine is normal. No evidence of fracture or subluxation. There are multilevel osteoarthritic changes worse at T12-L1, L4-L5 and L5-S1, with disc  space narrowing, mild endplate sclerosis and small osteophyte formation. There is broad-based central disc protrusion and L5-S1, without significant neural foramina narrowing. No evidence of bony neural foramina narrowing at any level. IMPRESSION: No  evidence of fracture or subluxation of the lumbosacral spine. Mild multilevel osteoarthritic changes, without evidence of bony neural foramina narrowing. Electronically Signed   By: Fidela Salisbury M.D.   On: 05/04/2015 19:43   Mr Foot Right W Wo Contrast  05/06/2015  CLINICAL DATA:  Two or 3 week history of pain, swelling and erythema of the great toe with ulceration and drainage. Evaluate for osteomyelitis. Initial encounter. EXAM: MRI OF THE RIGHT FOREFOOT WITHOUT AND WITH CONTRAST TECHNIQUE: Multiplanar, multisequence MR imaging was performed both before and after administration of intravenous contrast. CONTRAST:  48mL MULTIHANCE GADOBENATE DIMEGLUMINE 529 MG/ML IV SOLN COMPARISON:  Radiographs 05/04/2015. FINDINGS: Despite efforts by the technologist and patient, significant motion artifact is present on today's exam and could not be eliminated. This reduces exam sensitivity and specificity. There is soft tissue T2 hyperintensity throughout the forefoot, greatest medially. Post-contrast images demonstrate diffuse soft tissue enhancement throughout the great toe. Plantar to the distal phalanx, there is a nonenhancing 10 x 21 x 23 mm focus of T2 hyperintensity which probably reflects a superficial abscess. No other focal fluid collections are identified. There is susceptibility artifact in the plantar aspect of the midfoot near the base of the first metatarsal, likely secondary to an incidental metallic foreign body, not imaged on recent radiographs. Patient did have a tiny metallic foreign body more laterally in the midfoot on 2009 radiographs. There is nonspecific T2 hyperintensity and enhancement within the distal phalanx of the great toe which may be related to the tuftal fracture seen on today's radiographs. No cortical destruction or more proximal marrow changes are identified to strongly suggest osteomyelitis. The proximal phalanx and first metatarsal appear normal. The additional toes and  metatarsals appear normal. IMPRESSION: 1. Study is significantly motion degraded, limiting sensitivity and specificity. 2. Inflammatory changes in the great toe with probable small superficial abscess plantar to the distal phalanx. 3. Nonspecific bone marrow edema and enhancement within the distal phalanx of the great toe. These marrow changes could be related to the known fracture and do not necessarily imply superimposed osteomyelitis. No cortical destruction identified. 4. Suspected metallic foreign body within the plantar aspect of the midfoot near the base of the first metatarsal with resulting susceptibility artifact. In discussion with Dr. Cleda Mccreedy, the patient does not have any inflammatory change in this area to suggest this represents soft tissue emphysema. Electronically Signed   By: Richardean Sale M.D.   On: 05/06/2015 12:42   US Venous Img Lower Unilateral Right  05/05/2015  CLINICAL DATA:  Right lower extremity pain and swelling for 3 weeks. EXAM: RIGHT LOWER EXTREMITY VENOUS DOPPLER ULTRASOUND TECHNIQUE: Gray-scale sonography with graded compression, as well as color Doppler and duplex ultrasound were performed to evaluate the lower extremity deep venous systems from the level of the common femoral vein and including the common femoral, femoral, profunda femoral, popliteal and calf veins including the posterior tibial, peroneal and gastrocnemius veins when visible. The superficial great saphenous vein was also interrogated. Spectral Doppler was utilized to evaluate flow at rest and with distal augmentation maneuvers in the common femoral, femoral and popliteal veins. COMPARISON:  None. FINDINGS: Contralateral Common Femoral Vein: Respiratory phasicity is normal and symmetric with the symptomatic side. No evidence of thrombus. Normal compressibility. Common Femoral Vein: No evidence of thrombus. Normal  compressibility, respiratory phasicity and response to augmentation. Saphenofemoral Junction: No  evidence of thrombus. Normal compressibility and flow on color Doppler imaging. Profunda Femoral Vein: No evidence of thrombus. Normal compressibility and flow on color Doppler imaging. Femoral Vein: No evidence of thrombus. Normal compressibility, respiratory phasicity and response to augmentation. Popliteal Vein: No evidence of thrombus. Normal compressibility, respiratory phasicity and response to augmentation. Calf Veins: No evidence of thrombus. Normal compressibility and flow on color Doppler imaging. Superficial Great Saphenous Vein: No evidence of thrombus. Normal compressibility and flow on color Doppler imaging. Venous Reflux:  None. Other Findings:  None. IMPRESSION: No evidence of deep venous thrombosis. Electronically Signed   By: Lajean Manes M.D.   On: 05/05/2015 13:42   Dg Toe Great Right  05/04/2015  CLINICAL DATA:  Diabetic wound, great toe.  Pain, swelling, redness. EXAM: RIGHT GREAT TOE COMPARISON:  03/03/2008 FINDINGS: Fracture across the tuft of the distal phalanx, distracted 1 mm. Narrowing of the articular cartilage in the interphalangeal joint of the great toe. Small marginal spurs at the base of the proximal phalanx and from the first metatarsal head. Vascular calcifications at the metatarsal level. No subcutaneous gas or radiodense foreign body is identified. IMPRESSION: 1. Minimally displaced fracture, tuft distal phalanx right great toe. Electronically Signed   By: Lucrezia Europe M.D.   On: 05/04/2015 17:55    EKG:   Orders placed or performed during the hospital encounter of 04/10/15  . ED EKG within 10 minutes  . ED EKG within 10 minutes  . EKG 12-Lead  . EKG 12-Lead  . EKG      Management plans discussed with the patient, family and they are in agreement.  CODE STATUS:     Code Status Orders        Start     Ordered   05/06/15 1546  Full code   Continuous     05/06/15 1545    Advance Directive Documentation        Most Recent Value   Type of Advance  Directive  Living will   Pre-existing out of facility DNR order (yellow form or pink MOST form)     "MOST" Form in Place?        TOTAL TIME TAKING CARE OF THIS PATIENT: 45  minutes.    @[facility] @  on 12020/01/1615 at 9:49 AM  Between 7am to 6pm - Pager - [phone]  After 6pm go to [email or web address] - password [email or web address] [facility]  Office  [phone]  CC: Primary care physician; Idelle Crouch, MD

## 2015-05-08 NOTE — Care Management Important Message (Signed)
Important Message  Patient Details  Name: Marcus Williams MRN: [id number] Date of Birth: 1956/06/06   Medicare Important Message Given:  Yes    [name] 110-08-202016, 10:49 AM

## 2015-05-08 NOTE — Discharge Instructions (Signed)
Heart Failure Clinic appointment on May 21, 2015 at 9:30am with Darylene Price, [place]. Please call [phone] to reschedule.  Follow-up with podiatry Dr. Caryl Comes in a week Follow-up with outpatient diabetic t clinic in a week Follow-up with primary care physician Dr. Doy Hutching in a week Activity as tolerated as recommended by physical therapy Wound care by podiatry Diet healthy heart, diabetic Monitor daily weights and intake and output, restrict fluid intake

## 2015-05-08 NOTE — Consult Note (Signed)
   [facility] CM Inpatient Consult   1June 01, 202016  Marcus Williams Jan 15, 1956 [place]:[id number]  Spoke with patient at bedside to discuss of  [facility] Management services. Patient agreeable to services. Patient signed consent form and packet with information given for disease management support or other services. Patient will receive post hospital follow up calls and be assessed for home visits. Patients best contact number is home # [phone]  Of note, [facility] Care Management services does not replace or interfere with any services that are arranged by inpatient case management or social work. For questions, please contact:  Joylene Draft, RN, [facility]/Hospital Liaison [phone]- Mobile [phone]- [place]

## 2015-05-08 NOTE — Care Management (Signed)
Per Dr. Sharlotte Alamo Heel Touch Weight Bearing to surgery foot.

## 2015-05-08 NOTE — Progress Notes (Signed)
Pts d/c instrustructions given, f/u reviewed. Pt with no additional questions at this time. VSS, family at bedside

## 2015-05-09 ENCOUNTER — Encounter: Payer: Self-pay | Admitting: *Deleted

## 2015-05-09 ENCOUNTER — Other Ambulatory Visit: Payer: Self-pay | Admitting: *Deleted

## 2015-05-09 NOTE — Patient Outreach (Signed)
Transition of care call (week 1, discharged 11/22):  Spoke with pt, HIPPA  Verified.  Pt states having pain (s/p right first toe amputation), have to stay off it.  Pt reports pain medication helps, only gave him a few.   RN CM discussed calling MD if needed.   Pt states wife went over  his discharge summary, takes care of his  Medications.  Pt states his wife was also in the hospital, went back to work.   Pt reports taking antibiotic.   Pt reports blood sugars staying under 200 which is good for him.   RN CM discussed [facility] services- doing  weekly phone calls + home visit as part of transition of care.   As agreed, RN CM to f/u again telephonically 12/1 as part of ongoing transition of care, schedule home visit as pt does not want a visit next week.    Plan to f/u again telephonically 12/1. Plan to inform Dr. Doy Hutching of [facility] involvement.     Zara Chess.   [facility]  [phone]

## 2015-05-09 NOTE — Patient Outreach (Signed)
Attempt (first) made to contact pt for transition of care (discharged 11/22).  HIPPA compliant voice message left with contact number.   If no response, will have coworker Kayleen Memos RN CM NP covering for this RN CM f/u again 11/25.      Marcus Williams.   [facility]  [phone]

## 2015-05-10 LAB — CULTURE, BLOOD (ROUTINE X 2)
CULTURE: NO GROWTH
CULTURE: NO GROWTH

## 2015-05-14 ENCOUNTER — Other Ambulatory Visit: Payer: Self-pay | Admitting: Internal Medicine

## 2015-05-14 DIAGNOSIS — R600 Localized edema: Secondary | ICD-10-CM | POA: Diagnosis not present

## 2015-05-14 DIAGNOSIS — R1012 Left upper quadrant pain: Secondary | ICD-10-CM | POA: Diagnosis not present

## 2015-05-14 DIAGNOSIS — G4733 Obstructive sleep apnea (adult) (pediatric): Secondary | ICD-10-CM | POA: Diagnosis not present

## 2015-05-14 DIAGNOSIS — E1143 Type 2 diabetes mellitus with diabetic autonomic (poly)neuropathy: Secondary | ICD-10-CM | POA: Diagnosis not present

## 2015-05-14 DIAGNOSIS — Z79899 Other long term (current) drug therapy: Secondary | ICD-10-CM | POA: Diagnosis not present

## 2015-05-14 DIAGNOSIS — I10 Essential (primary) hypertension: Secondary | ICD-10-CM | POA: Diagnosis not present

## 2015-05-16 ENCOUNTER — Emergency Department
Admission: EM | Admit: 2015-05-16 | Discharge: 2015-05-17 | Disposition: A | Discharge status: Home / Self Care | Payer: Commercial Managed Care - HMO | Attending: Emergency Medicine | Admitting: Emergency Medicine

## 2015-05-16 ENCOUNTER — Emergency Department: Payer: Commercial Managed Care - HMO

## 2015-05-16 DIAGNOSIS — Z79899 Other long term (current) drug therapy: Secondary | ICD-10-CM | POA: Diagnosis not present

## 2015-05-16 DIAGNOSIS — Z89431 Acquired absence of right foot: Secondary | ICD-10-CM | POA: Insufficient documentation

## 2015-05-16 DIAGNOSIS — Z7951 Long term (current) use of inhaled steroids: Secondary | ICD-10-CM | POA: Insufficient documentation

## 2015-05-16 DIAGNOSIS — R0600 Dyspnea, unspecified: Secondary | ICD-10-CM

## 2015-05-16 DIAGNOSIS — I10 Essential (primary) hypertension: Secondary | ICD-10-CM | POA: Diagnosis not present

## 2015-05-16 DIAGNOSIS — E119 Type 2 diabetes mellitus without complications: Secondary | ICD-10-CM | POA: Insufficient documentation

## 2015-05-16 DIAGNOSIS — Z7982 Long term (current) use of aspirin: Secondary | ICD-10-CM | POA: Insufficient documentation

## 2015-05-16 DIAGNOSIS — I509 Heart failure, unspecified: Secondary | ICD-10-CM | POA: Insufficient documentation

## 2015-05-16 DIAGNOSIS — Z794 Long term (current) use of insulin: Secondary | ICD-10-CM | POA: Diagnosis not present

## 2015-05-16 DIAGNOSIS — I110 Hypertensive heart disease with heart failure: Secondary | ICD-10-CM | POA: Diagnosis not present

## 2015-05-16 DIAGNOSIS — Z7984 Long term (current) use of oral hypoglycemic drugs: Secondary | ICD-10-CM | POA: Insufficient documentation

## 2015-05-16 DIAGNOSIS — R0602 Shortness of breath: Secondary | ICD-10-CM | POA: Diagnosis not present

## 2015-05-16 DIAGNOSIS — Z792 Long term (current) use of antibiotics: Secondary | ICD-10-CM | POA: Insufficient documentation

## 2015-05-16 DIAGNOSIS — R079 Chest pain, unspecified: Secondary | ICD-10-CM | POA: Diagnosis not present

## 2015-05-16 LAB — CBC
HEMATOCRIT: 34.4 % — AB (ref 40.0–52.0)
Hemoglobin: 11 g/dL — ABNORMAL LOW (ref 13.0–18.0)
MCH: 24.1 pg — ABNORMAL LOW (ref 26.0–34.0)
MCHC: 32 g/dL (ref 32.0–36.0)
MCV: 75.1 fL — AB (ref 80.0–100.0)
Platelets: 251 10*3/uL (ref 150–440)
RBC: 4.58 MIL/uL (ref 4.40–5.90)
RDW: 17.7 % — AB (ref 11.5–14.5)
WBC: 12.2 10*3/uL — AB (ref 3.8–10.6)

## 2015-05-16 LAB — BASIC METABOLIC PANEL
ANION GAP: 8 (ref 5–15)
BUN: 16 mg/dL (ref 6–20)
CO2: 24 mmol/L (ref 22–32)
Calcium: 9.2 mg/dL (ref 8.9–10.3)
Chloride: 108 mmol/L (ref 101–111)
Creatinine, Ser: 0.86 mg/dL (ref 0.61–1.24)
GFR calc Af Amer: >60 mL/min (ref >60)
GLUCOSE: 82 mg/dL (ref 65–99)
POTASSIUM: 3.9 mmol/L (ref 3.5–5.1)
Sodium: 140 mmol/L (ref 135–145)

## 2015-05-16 LAB — TROPONIN I: Troponin I: <0.03 ng/mL (ref <0.031)

## 2015-05-16 NOTE — ED Notes (Signed)
Pt in via triage w/ complaints of SOB x1 day.  Pt reports intermittent chest pain on left side.  Pt denies any dizziness, weakness.  Pt A/Ox4, vitals WDL, no immediate distress at this time.

## 2015-05-16 NOTE — ED Provider Notes (Signed)
[facility] Emergency Department Provider Note  Time seen: 11:30 PM  I have reviewed the triage vital signs and the nursing notes.   HISTORY  Chief Complaint Shortness of Breath and Chest Pain    HPI Marcus Williams is a 59 y.o. male with a past medical history of CAD, MI, A. fib on the relative, status post CABG, diabetes, hypertension, hyperlipidemia, who presents the emergency department left-sided chest pain and difficulty breathing. According to the patient at 1:30 PM today he experienced left-sided chest pain which she states has been coming and going. He states he does feel somewhat more short of breath today than he has. Of note the patient was recently hospitalized approximately 1.5 weeks ago for cellulitis requiring a toe amputation. Patient denies any nausea, diaphoresis. Describes his shortness of breath is moderate, however he states he always feels short of breath and he is currently undergoing sleep studies to have home oxygen supplied to him.     Past Medical History  Diagnosis Date  . MI (myocardial infarction) ([place])   . Sepsis ([place])   . History of cardioversion   . Diabetes mellitus without complication ([place])   . Hypertension   . CAD (coronary artery disease)   . SVT (supraventricular tachycardia) ([place])   . OSA on CPAP   . A-fib ([place])   . Chronic systolic CHF (congestive heart failure) [facility])     Patient Active Problem List   Diagnosis Date Noted  . Cellulitis 05/04/2015  . Acute respiratory failure with hypoxia ([place]) 03/26/2015  . CHF (congestive heart failure) ([place]) 01/15/2015  . HTN (hypertension) 01/14/2015  . Type II diabetes mellitus ([place]) 01/14/2015  . COPD (chronic obstructive pulmonary disease) ([place]) 01/14/2015  . OSA on CPAP 01/14/2015  . CAD (coronary artery disease) 01/14/2015  . A-fib ([place]) 01/14/2015  . Pain of left calf 01/14/2015  . Acute on chronic systolic CHF (congestive heart failure) ([place]) 01/14/2015  . COPD  exacerbation ([place]) 12/22/2014  . Diabetes ([place]) 10/14/2014    Past Surgical History  Procedure Laterality Date  . Quadruple bypass    . Knee surgery    . Bunionectomy    . Coronary artery bypass graft    . Amputation toe Right 05/06/2015    Procedure: AMPUTATION TOE;  Surgeon: Sharlotte Alamo, MD;  Location: [facility] ORS;  Service: Podiatry;  Laterality: Right;    Current Outpatient Rx  Name  Route  Sig  Dispense  Refill  . acetaminophen (TYLENOL) 325 MG tablet   Oral   Take 2 tablets (650 mg total) by mouth every 6 (six) hours as needed for fever.         Marland Kitchen albuterol (PROVENTIL HFA;VENTOLIN HFA) 108 (90 BASE) MCG/ACT inhaler   Inhalation   Inhale 2 puffs into the lungs every 6 (six) hours as needed for wheezing or shortness of breath.   1 Inhaler   0   . ALPRAZolam (XANAX) 0.25 MG tablet   Oral   Take 0.25 mg by mouth 2 (two) times daily as needed for anxiety.         Marland Kitchen aspirin EC 325 MG tablet   Oral   Take 325 mg by mouth daily.         . ciprofloxacin (CIPRO) 500 MG tablet   Oral   Take 1 tablet (500 mg total) by mouth 2 (two) times daily.   20 tablet   0   . diltiazem (CARDIZEM CD) 300 MG 24 hr capsule  Oral   Take 1 capsule (300 mg total) by mouth daily.   30 capsule   5   . fluticasone (FLONASE) 50 MCG/ACT nasal spray   Each Nare   Place 1 spray into both nostrils daily.         . Fluticasone-Salmeterol (ADVAIR DISKUS) 250-50 MCG/DOSE AEPB   Inhalation   Inhale 1 puff into the lungs 2 (two) times daily.         . furosemide (LASIX) 40 MG tablet   Oral   Take 1 tablet (40 mg total) by mouth 2 (two) times daily.   60 tablet   5   . glimepiride (AMARYL) 4 MG tablet   Oral   Take 4 mg by mouth daily.         . insulin NPH Human (HUMULIN N,NOVOLIN N) 100 UNIT/ML injection   Subcutaneous   Inject 40 Units into the skin 2 (two) times daily.         Marland Kitchen ipratropium-albuterol (DUONEB) 0.5-2.5 (3) MG/3ML SOLN   Nebulization   Take 3 mLs by  nebulization every 4 (four) hours as needed (for shortness of breath).         . metFORMIN (GLUCOPHAGE) 1000 MG tablet   Oral   Take 1,000 mg by mouth 2 (two) times daily with a meal.         . metoprolol tartrate (LOPRESSOR) 25 MG tablet   Oral   Take 1 tablet (25 mg total) by mouth 2 (two) times daily.   60 tablet   5   . nitroGLYCERIN (NITROSTAT) 0.4 MG SL tablet   Sublingual   Place 0.4 mg under the tongue every 5 (five) minutes as needed for chest pain.         Marland Kitchen oxyCODONE-acetaminophen (PERCOCET/ROXICET) 5-325 MG tablet   Oral   Take 1-2 tablets by mouth every 6 (six) hours as needed for moderate pain or severe pain.   5 tablet   0   . pramipexole (MIRAPEX) 0.5 MG tablet   Oral   Take 0.5 mg by mouth at bedtime.         . pregabalin (LYRICA) 75 MG capsule   Oral   Take 75 mg by mouth 2 (two) times daily.         . simvastatin (ZOCOR) 40 MG tablet   Oral   Take 40 mg by mouth at bedtime.          . traZODone (DESYREL) 100 MG tablet   Oral   Take 100 mg by mouth at bedtime.         Alveda Reasons 15 MG TABS tablet   Oral   Take 15 mg by mouth every evening.            Dispense as written.     Allergies Review of patient's allergies indicates no known allergies.  Family History  Problem Relation Age of Onset  . Stroke Mother   . Heart attack Mother   . Hypertension Mother   . Heart attack Father   . Hypertension Father   . Heart attack Brother     #1  . Diabetes Brother     #1  . Heart disease Brother     #2  . Lung disease Brother     #2  . Hypertension Brother     #2  . Diabetes Brother     #2    Social History Social History  Substance Use Topics  . Smoking  status: Never Smoker   . Smokeless tobacco: Not on file  . Alcohol Use: No    Review of Systems Constitutional: Negative for fever Cardiovascular: Intermittent mild left-sided chest pains, denies any current chest pain.Marland Kitchen Respiratory: Moderate shortness of  breath. Gastrointestinal: Negative for abdominal pain Musculoskeletal: Negative for back pain. Neurological: Negative for headache 10-point ROS otherwise negative.  ____________________________________________   PHYSICAL EXAM:  VITAL SIGNS: ED Triage Vitals  Enc Vitals Group     BP 05/16/15 2016 151/63 mmHg     Pulse Rate 05/16/15 2016 63     Resp 05/16/15 2016 18     Temp 05/16/15 2016 98.5 F (36.9 C)     Temp Source 05/16/15 2016 Oral     SpO2 05/16/15 2016 96 %     Weight 05/16/15 2016 291 lb (131.997 kg)     Height 05/16/15 2016 5\' 8"  (1.727 m)     Head Cir --      Peak Flow --      Pain Score 05/16/15 2016 8     Pain Loc --      Pain Edu? --      Excl. in [place]? --     Constitutional: Alert and oriented. Well appearing and in no distress. Eyes: Normal exam ENT   Head: Normocephalic and atraumatic.   Mouth/Throat: Mucous membranes are moist. Cardiovascular: Normal rate, regular rhythm. No murmur Respiratory: Normal respiratory effort without tachypnea nor retractions. Breath sounds are clear Gastrointestinal: Soft and nontender. No distention.   Musculoskeletal: Nontender with normal range of motion in all extremities. Mild lower extremity edema, equal bilaterally, right foot status post amputation. Neurologic:  Normal speech and language. No gross focal neurologic deficits Skin:  Skin is warm, dry and intact.  Psychiatric: Mood and affect are normal. Speech and behavior are normal.   ____________________________________________    EKG  EKG reviewed and interpreted, so shows normal sinus rhythm at 65 bpm, narrow QRS, normal axis, normal intervals, largely normal ST segments.  ____________________________________________    RADIOLOGY  Mild interstitial edema.  ____________________________________________    INITIAL IMPRESSION / ASSESSMENT AND PLAN / ED COURSE  Pertinent labs & imaging results that were available during my care of the patient were  reviewed by me and considered in my medical decision making (see chart for details).  Patient presents the emergency department acute onset shortness of breath, intermittent chest pains. Of note the patient recently underwent a right toe amputation, during the procedure his Xarelto is stopped, and he was immobilized. Given his labs and Xarelto with immobilization, acute onset of shortness of breath with left-sided chest pain we will proceed with a CT angiography of the chest to rule out pulmonary emboli. Chest x-ray does show mild interstitial edema which could very likely be the cause of his shortness of breath. Currently the patient has a room air oxygen saturation of 95-96%.   Second troponin is negative. Patient's CT scan negative for PE, likely mild congestive heart failure changes/interstitial edema. We will increase the patient's Lasix from 40 mg twice a day to 40 mg 3 times a day for the next 5 days. Patient is follow up with his cardiologist. Patient is agreeable to this plan. He continues to have a 95-96 percent room air oxygen saturation. ____________________________________________   FINAL CLINICAL IMPRESSION(S) / ED DIAGNOSES  Dyspnea Chest pain Congestive heart failure  Harvest Dark, MD 05/17/15 [phone]

## 2015-05-16 NOTE — ED Notes (Addendum)
Pt to triage via w/c with no distress noted; reports SOB since 2pm today; denies any recent illness; toe amputation 2wks ago; pt also c/o left sided CP

## 2015-05-16 NOTE — ED Notes (Signed)
Pt to xray via w/c accomp by xray tech

## 2015-05-17 ENCOUNTER — Encounter: Payer: Self-pay | Admitting: Radiology

## 2015-05-17 ENCOUNTER — Emergency Department: Payer: Commercial Managed Care - HMO

## 2015-05-17 ENCOUNTER — Other Ambulatory Visit: Payer: Self-pay | Admitting: *Deleted

## 2015-05-17 ENCOUNTER — Ambulatory Visit: Admission: RE | Admit: 2015-05-17 | Payer: Commercial Managed Care - HMO | Source: Ambulatory Visit

## 2015-05-17 DIAGNOSIS — Z7984 Long term (current) use of oral hypoglycemic drugs: Secondary | ICD-10-CM | POA: Diagnosis not present

## 2015-05-17 DIAGNOSIS — R079 Chest pain, unspecified: Secondary | ICD-10-CM | POA: Diagnosis not present

## 2015-05-17 DIAGNOSIS — Z7951 Long term (current) use of inhaled steroids: Secondary | ICD-10-CM | POA: Diagnosis not present

## 2015-05-17 DIAGNOSIS — E119 Type 2 diabetes mellitus without complications: Secondary | ICD-10-CM | POA: Diagnosis not present

## 2015-05-17 DIAGNOSIS — Z792 Long term (current) use of antibiotics: Secondary | ICD-10-CM | POA: Diagnosis not present

## 2015-05-17 DIAGNOSIS — Z7982 Long term (current) use of aspirin: Secondary | ICD-10-CM | POA: Diagnosis not present

## 2015-05-17 DIAGNOSIS — R0600 Dyspnea, unspecified: Secondary | ICD-10-CM | POA: Diagnosis not present

## 2015-05-17 DIAGNOSIS — Z794 Long term (current) use of insulin: Secondary | ICD-10-CM | POA: Diagnosis not present

## 2015-05-17 DIAGNOSIS — I10 Essential (primary) hypertension: Secondary | ICD-10-CM | POA: Diagnosis not present

## 2015-05-17 DIAGNOSIS — I509 Heart failure, unspecified: Secondary | ICD-10-CM | POA: Diagnosis not present

## 2015-05-17 DIAGNOSIS — Z79899 Other long term (current) drug therapy: Secondary | ICD-10-CM | POA: Diagnosis not present

## 2015-05-17 LAB — TROPONIN I

## 2015-05-17 MED ORDER — LORAZEPAM 2 MG/ML IJ SOLN
1.0000 mg | Freq: Once | INTRAMUSCULAR | Status: AC
  Administered 2015-05-17: 1 mg via INTRAVENOUS
  Filled 2015-05-17: qty 1

## 2015-05-17 MED ORDER — IOHEXOL 350 MG/ML SOLN
100.0000 mL | Freq: Once | INTRAVENOUS | Status: AC | PRN
  Administered 2015-05-17: 100 mL via INTRAVENOUS

## 2015-05-17 NOTE — Patient Outreach (Signed)
Transition of care call (week 2, discharged 11/22):  RN CM inquired about pt going to ER yesterday (chest pain, sob) to which pt states  no sob or chest pain today.  Pt states when he came home yesterday-  found out reason he was sob, did not put his Lasix in pill planner for 2-3 days, put it in this am.   Pt states he has no swelling.  Pt states he is going to take the Lasix 40 mg three times a day as instructed by ER MD but did not see the need to f/u with the Heart MD since he knows why he was sob, saw MD  2 weeks ago.    RN CM encouraged pt to call Heart MD, let him know about ED visit to which pt said he would.   Also discussed doing a home visit next week as part of transition of care to which pt states will call RN CM back.   If do not hear back from pt about scheduling home visit, will call him.     Zara Chess.   [place] Care Management  [phone]

## 2015-05-17 NOTE — Discharge Instructions (Signed)
You have been seen in the emergency department today for shortness of breath. Your workup has resulted showing your shortness breath is likely due to to your congestive heart failure. Please increase your Lasix/furosemide from 40 mg twice daily to 40 mg 3 times daily for the next 5 days. Please follow-up with your cardiologist as soon as possible. Return to the emergency department for any further chest pain, worsening trouble breathing, or any other symptom personally concerning to your self.    Shortness of Breath Shortness of breath means you have trouble breathing. Shortness of breath needs medical care right away. HOME CARE   Do not smoke.  Avoid being around chemicals or things (paint fumes, dust) that may bother your breathing.  Rest as needed. Slowly begin your normal activities.  Only take medicines as told by your doctor.  Keep all doctor visits as told. GET HELP RIGHT AWAY IF:   Your shortness of breath gets worse.  You feel lightheaded, pass out (faint), or have a cough that is not helped by medicine.  You cough up blood.  You have pain with breathing.  You have pain in your chest, arms, shoulders, or belly (abdomen).  You have a fever.  You cannot walk up stairs or exercise the way you normally do.  You do not get better in the time expected.  You have a hard time doing normal activities even with rest.  You have problems with your medicines.  You have any new symptoms. MAKE SURE YOU:  Understand these instructions.  Will watch your condition.  Will get help right away if you are not doing well or get worse.   This information is not intended to replace advice given to you by your health care provider. Make sure you discuss any questions you have with your health care provider.   Document Released: 11/19/2007 Document Revised: 06/07/2013 Document Reviewed: 08/18/2011 Elsevier Interactive Patient Education [employer].

## 2015-05-17 NOTE — ED Notes (Signed)
Patient transported to CT 

## 2015-05-21 ENCOUNTER — Ambulatory Visit: Payer: Commercial Managed Care - HMO | Admitting: Family

## 2015-05-22 ENCOUNTER — Ambulatory Visit
Admission: RE | Admit: 2015-05-22 | Discharge: 2015-05-22 | Disposition: A | Discharge status: Home / Self Care | Payer: Commercial Managed Care - HMO | Source: Ambulatory Visit | Attending: Internal Medicine | Admitting: Internal Medicine

## 2015-05-22 DIAGNOSIS — R1012 Left upper quadrant pain: Secondary | ICD-10-CM | POA: Diagnosis not present

## 2015-05-22 MED ORDER — IOHEXOL 350 MG/ML SOLN
100.0000 mL | Freq: Once | INTRAVENOUS | Status: AC | PRN
  Administered 2015-05-22: 100 mL via INTRAVENOUS

## 2015-05-25 ENCOUNTER — Other Ambulatory Visit: Payer: Self-pay | Admitting: *Deleted

## 2015-05-25 NOTE — Patient Outreach (Signed)
[facility]) Care Management   05/25/2015  Marcus Williams Jul 03, 1955 [id number]  Marcus Williams is an 59 y.o. male  Subjective:  Pt reports noticed drainage on sock (right foot) today, was up on his feet more yesterday.  Pt reports is back to helping his spouse with housework, driving,  take breaks when needed.  Pt states not sleeping good, CPAP was updated, suppose to have oxygen put on CPAP.  Pt states sugar today Was 120, staying there.   Objective:   Filed Vitals:   05/25/15 1323  BP: 154/80  Pulse: 63  Resp: 20    ROS  Physical Exam  Constitutional: He is oriented to person, place, and time. He appears well-developed and well-nourished.  Cardiovascular: Regular rhythm and normal heart sounds.   Respiratory: Breath sounds normal.  GI: Soft. Bowel sounds are normal.  Musculoskeletal: Normal range of motion.  Neurological: He is alert and oriented to person, place, and time.  Skin: Skin is warm.  Small to moderate amount of serosanguineous drainage  noted on pt's sock - coming from  right big toe amputation site. No  s/s of infection noted.    Psychiatric: He has a normal mood and affect. His behavior is normal. Judgment and thought content normal.    Current Medications:  Reviewed with pt.  Current Outpatient Prescriptions  Medication Sig Dispense Refill  . acetaminophen (TYLENOL) 325 MG tablet Take 2 tablets (650 mg total) by mouth every 6 (six) hours as needed for fever.    Marland Kitchen aspirin EC 325 MG tablet Take 325 mg by mouth daily.    Marland Kitchen diltiazem (CARDIZEM CD) 300 MG 24 hr capsule Take 1 capsule (300 mg total) by mouth daily. 30 capsule 5  . fluticasone (FLONASE) 50 MCG/ACT nasal spray Place 1 spray into both nostrils daily.    . furosemide (LASIX) 40 MG tablet Take 1 tablet (40 mg total) by mouth 2 (two) times daily. 60 tablet 5  . insulin NPH Human (HUMULIN N,NOVOLIN N) 100 UNIT/ML injection Inject 40 Units into the skin 2 (two) times daily.    Marland Kitchen  ipratropium-albuterol (DUONEB) 0.5-2.5 (3) MG/3ML SOLN Take 3 mLs by nebulization every 4 (four) hours as needed (for shortness of breath).    . metFORMIN (GLUCOPHAGE) 1000 MG tablet Take 1,000 mg by mouth 2 (two) times daily with a meal.    . metoprolol tartrate (LOPRESSOR) 25 MG tablet Take 1 tablet (25 mg total) by mouth 2 (two) times daily. 60 tablet 5  . pantoprazole (PROTONIX) 40 MG tablet Take 40 mg by mouth daily.    . simvastatin (ZOCOR) 40 MG tablet Take 40 mg by mouth at bedtime.     . traZODone (DESYREL) 100 MG tablet Take 100 mg by mouth at bedtime.    Alveda Reasons 15 MG TABS tablet Take 15 mg by mouth every evening.     Marland Kitchen albuterol (PROVENTIL HFA;VENTOLIN HFA) 108 (90 BASE) MCG/ACT inhaler Inhale 2 puffs into the lungs every 6 (six) hours as needed for wheezing or shortness of breath. (Patient not taking: Reported on 05/25/2015) 1 Inhaler 0  . ALPRAZolam (XANAX) 0.25 MG tablet Take 0.25 mg by mouth 2 (two) times daily as needed for anxiety.    . Fluticasone-Salmeterol (ADVAIR DISKUS) 250-50 MCG/DOSE AEPB Inhale 1 puff into the lungs 2 (two) times daily.    Marland Kitchen glimepiride (AMARYL) 4 MG tablet Take 4 mg by mouth daily.    . nitroGLYCERIN (NITROSTAT) 0.4 MG SL tablet Place  0.4 mg under the tongue every 5 (five) minutes as needed for chest pain.    Marland Kitchen oxyCODONE-acetaminophen (PERCOCET/ROXICET) 5-325 MG tablet Take 1-2 tablets by mouth every 6 (six) hours as needed for moderate pain or severe pain. (Patient not taking: Reported on 05/25/2015) 5 tablet 0  . pramipexole (MIRAPEX) 0.5 MG tablet Take 0.5 mg by mouth at bedtime.    . pregabalin (LYRICA) 75 MG capsule Take 75 mg by mouth 2 (two) times daily.     No current facility-administered medications for this visit.    Functional Status:   In your present state of health, do you have any difficulty performing the following activities: 05/25/2015 05/04/2015  Hearing? - N  Vision? - N  Difficulty concentrating or making decisions? - N  Walking  or climbing stairs? - N  Dressing or bathing? - N  Doing errands, shopping? - Y  Preparing Food and eating ? Y -  Using the Toilet? N -  In the past six months, have you accidently leaked urine? N -  Do you have problems with loss of bowel control? N -  Managing your Medications? N -  Managing your Finances? N -  Housekeeping or managing your Housekeeping? N -    Fall/Depression Screening:    PHQ 2/9 Scores 05/25/2015 01/29/2015  PHQ - 2 Score 1 0    Assessment:   Skin integrity-  Small to moderate amount of  serosanguineous drainage  noted on pt's right sock- coming from right toe amputation site.   No s/s of infection noted. Pt reported on his feet more yesterday, noted drainage on sock today.  Cleaned wound with soap and water- no further drainage noted, applied band aid.                             DM-  Pt reported blood sugar today 120, staying there.  Since hospital discharge, ranges 83-225.                           HTN- BP today 154/80.     Plan: Skin integrity- pt to continue to keep wound clean, apply bandaid/change daily until healed.                                   Pt to continue to take antibiotic as directed.                                  Pt to f/u with MD 12/19, call prior to appointment if needed (ex.- bleeding).            DM- pt to continue to check sugars twice a day, record in [facility] calendar provided today.                   Pt to monitor intake of carbohydrates.            HTN- pt to continue to be compliant with Low Na+ diet.            Plan to f/u with pt again telephonically 12/16- as part of ongoing transition of care.            Plan to send Dr. Doy Hutching by fax in [place]- today's encounter.   THN CM  Care Plan Problem One        Most Recent Value   Care Plan Problem One  Risk for readmission- recent hospitalization s/p right first toe amputation    Role Documenting the Problem One  Care Management [place] for Problem One  Active   THN Long  Term Goal (31-90 days)  Pt would not readmit 31 days from day of discharge.    THN Long Term Goal Start Date  05/09/15   Interventions for Problem One Long Term Goal  RN CM to provide weekly phone calls 31 days post discharge, schedule home visit to assess    THN CM Short Term Goal #1 (0-30 days)  Impaired skin integrity- pt wound (s/p right first toe amputation) would heal without complicatins in the next 30 days    THN CM Short Term Goal #1 Start Date  05/09/15   Interventions for Short Term Goal #1  Discussed with pt compliance with antibiotic. f/u with Podiatry MD as ordered.    THN CM Short Term Goal #2 (0-30 days)  Pt would have good pain management in the next 14 days    THN CM Short Term Goal #2 Start Date  05/09/15   THN CM Short Term Goal #2 Met Date  -- [not met- +6 today,out of rx pain medication,to tough it out]   Interventions for Short Term Goal #2  Discussed with pt taking pain medication as needed, call MD if need more pain medicatiion (only given a few pills). Also, pt to stay off right foot as instructed.     [facility] CM Care Plan Problem Two        Most Recent Value   Care Plan Problem Two  DM- better self management    Role Documenting the Problem Two  Care Management Coordinator   Care Plan for Problem Two  Active               Zara Chess.   [place] Care Management  [phone]

## 2015-05-31 ENCOUNTER — Ambulatory Visit: Payer: Commercial Managed Care - HMO | Attending: Pulmonary Disease

## 2015-05-31 DIAGNOSIS — G4733 Obstructive sleep apnea (adult) (pediatric): Secondary | ICD-10-CM | POA: Diagnosis not present

## 2015-06-01 ENCOUNTER — Ambulatory Visit: Payer: Commercial Managed Care - HMO | Admitting: *Deleted

## 2015-06-01 ENCOUNTER — Other Ambulatory Visit: Payer: Self-pay | Admitting: *Deleted

## 2015-06-01 NOTE — Patient Outreach (Signed)
Attempt made to contact pt for scheduled transition of care call (week 4- discharged 11/22).  HIPPA compliant voice message left with contact number.  If no response, will try again.     Zara Chess.   [place] Care Management  [phone]

## 2015-06-06 ENCOUNTER — Other Ambulatory Visit: Payer: Self-pay | Admitting: *Deleted

## 2015-06-06 NOTE — Patient Outreach (Signed)
Attempt made to contact pt as part of ongoing transition of care (discharged 11/22), HIPPA compliant voice message left with contact number.  If no response, will try again.     Zara Chess.   [place] Care Management  [phone]

## 2015-06-08 ENCOUNTER — Other Ambulatory Visit: Payer: Self-pay

## 2015-06-08 NOTE — Patient Outreach (Signed)
Unsuccessful attempt made to contact patient via telephone for community care coordination at [phone]. HIPPA compliant message left for patient stating the purpose of the call.    Message left that Rose would be in contact next week for further assessment of community care coordination needs.

## 2015-06-14 ENCOUNTER — Other Ambulatory Visit: Payer: Self-pay | Admitting: *Deleted

## 2015-06-14 NOTE — Patient Outreach (Signed)
Fourth attempt made to contact pt as part of ongoing transition of care.  Last contact was 12/9- home visit.  HIPPA compliant voice message left with contact number.  If no response, plan to sent an unable to contact letter and if no response to letter, plan to close case.    Zara Chess.   [place] Care Management  [phone]

## 2015-06-15 ENCOUNTER — Encounter: Payer: Self-pay | Admitting: *Deleted

## 2015-06-19 DIAGNOSIS — G4733 Obstructive sleep apnea (adult) (pediatric): Secondary | ICD-10-CM | POA: Diagnosis not present

## 2015-06-20 ENCOUNTER — Telehealth: Payer: Self-pay | Admitting: *Deleted

## 2015-06-20 DIAGNOSIS — G4733 Obstructive sleep apnea (adult) (pediatric): Secondary | ICD-10-CM

## 2015-06-20 DIAGNOSIS — J449 Chronic obstructive pulmonary disease, unspecified: Secondary | ICD-10-CM

## 2015-06-20 NOTE — Telephone Encounter (Signed)
Order placed to bleed in O2 into CPAP and to make sure CPAP setting is at 14 cm.

## 2015-06-20 NOTE — Telephone Encounter (Signed)
Order placed for bleed in O2 into CPAP

## 2015-06-27 ENCOUNTER — Other Ambulatory Visit: Payer: Self-pay | Admitting: Internal Medicine

## 2015-06-27 DIAGNOSIS — G4733 Obstructive sleep apnea (adult) (pediatric): Secondary | ICD-10-CM

## 2015-07-02 ENCOUNTER — Encounter: Payer: Self-pay | Admitting: *Deleted

## 2015-07-09 ENCOUNTER — Ambulatory Visit: Payer: Commercial Managed Care - HMO | Admitting: Internal Medicine

## 2015-07-10 DIAGNOSIS — B351 Tinea unguium: Secondary | ICD-10-CM | POA: Diagnosis not present

## 2015-07-10 DIAGNOSIS — E1140 Type 2 diabetes mellitus with diabetic neuropathy, unspecified: Secondary | ICD-10-CM | POA: Diagnosis not present

## 2015-07-10 DIAGNOSIS — Z794 Long term (current) use of insulin: Secondary | ICD-10-CM | POA: Diagnosis not present

## 2015-07-11 ENCOUNTER — Ambulatory Visit (INDEPENDENT_AMBULATORY_CARE_PROVIDER_SITE_OTHER): Payer: Commercial Managed Care - HMO | Admitting: Internal Medicine

## 2015-07-11 ENCOUNTER — Encounter: Payer: Self-pay | Admitting: Internal Medicine

## 2015-07-11 VITALS — BP 134/66 | HR 63 | Ht 68.0 in | Wt 275.6 lb

## 2015-07-11 DIAGNOSIS — J449 Chronic obstructive pulmonary disease, unspecified: Secondary | ICD-10-CM | POA: Diagnosis not present

## 2015-07-11 MED ORDER — ALBUTEROL SULFATE HFA 108 (90 BASE) MCG/ACT IN AERS: 2.0000 | INHALATION_SPRAY | Freq: Four times a day (QID) | RESPIRATORY_TRACT | Status: DC | PRN

## 2015-07-11 MED ORDER — ALBUTEROL SULFATE HFA 108 (90 BASE) MCG/ACT IN AERS: 2.0000 | INHALATION_SPRAY | Freq: Four times a day (QID) | RESPIRATORY_TRACT | Status: AC | PRN

## 2015-07-11 NOTE — Patient Instructions (Signed)
Chronic Obstructive Pulmonary Disease Chronic obstructive pulmonary disease (COPD) is a common lung condition in which airflow from the lungs is limited. COPD is a general term that can be used to describe many different lung problems that limit airflow, including both chronic bronchitis and emphysema. If you have COPD, your lung function will probably never return to normal, but there are measures you can take to improve lung function and make yourself feel better. CAUSES   Smoking (common).  Exposure to secondhand smoke.  Genetic problems.  Chronic inflammatory lung diseases or recurrent infections. SYMPTOMS  Shortness of breath, especially with physical activity.  Deep, persistent (chronic) cough with a large amount of thick mucus.  Wheezing.  Rapid breaths (tachypnea).  Gray or bluish discoloration (cyanosis) of the skin, especially in your fingers, toes, or lips.  Fatigue.  Weight loss.  Frequent infections or episodes when breathing symptoms become much worse (exacerbations).  Chest tightness. DIAGNOSIS Your health care provider will take a medical history and perform a physical examination to diagnose COPD. Additional tests for COPD may include:  Lung (pulmonary) function tests.  Chest X-ray.  CT scan.  Blood tests. TREATMENT  Treatment for COPD may include:  Inhaler and nebulizer medicines. These help manage the symptoms of COPD and make your breathing more comfortable.  Supplemental oxygen. Supplemental oxygen is only helpful if you have a low oxygen level in your blood.  Exercise and physical activity. These are beneficial for nearly all people with COPD.  Lung surgery or transplant.  Nutrition therapy to gain weight, if you are underweight.  Pulmonary rehabilitation. This may involve working with a team of health care providers and specialists, such as respiratory, occupational, and physical therapists. HOME CARE INSTRUCTIONS  Take all medicines  (inhaled or pills) as directed by your health care provider.  Avoid over-the-counter medicines or cough syrups that dry up your airway (such as antihistamines) and slow down the elimination of secretions unless instructed otherwise by your health care provider.  If you are a smoker, the most important thing that you can do is stop smoking. Continuing to smoke will cause further lung damage and breathing trouble. Ask your health care provider for help with quitting smoking. He or she can direct you to community resources or hospitals that provide support.  Avoid exposure to irritants such as smoke, chemicals, and fumes that aggravate your breathing.  Use oxygen therapy and pulmonary rehabilitation if directed by your health care provider. If you require home oxygen therapy, ask your health care provider whether you should purchase a pulse oximeter to measure your oxygen level at home.  Avoid contact with individuals who have a contagious illness.  Avoid extreme temperature and humidity changes.  Eat healthy foods. Eating smaller, more frequent meals and resting before meals may help you maintain your strength.  Stay active, but balance activity with periods of rest. Exercise and physical activity will help you maintain your ability to do things you want to do.  Preventing infection and hospitalization is very important when you have COPD. Make sure to receive all the vaccines your health care provider recommends, especially the pneumococcal and influenza vaccines. Ask your health care provider whether you need a pneumonia vaccine.  Learn and use relaxation techniques to manage stress.  Learn and use controlled breathing techniques as directed by your health care provider. Controlled breathing techniques include:  Pursed lip breathing. Start by breathing in (inhaling) through your nose for 1 second. Then, purse your lips as if you were   going to whistle and breathe out (exhale) through the  pursed lips for 2 seconds.  Diaphragmatic breathing. Start by putting one hand on your abdomen just above your waist. Inhale slowly through your nose. The hand on your abdomen should move out. Then purse your lips and exhale slowly. You should be able to feel the hand on your abdomen moving in as you exhale.  Learn and use controlled coughing to clear mucus from your lungs. Controlled coughing is a series of short, progressive coughs. The steps of controlled coughing are: 1. Lean your head slightly forward. 2. Breathe in deeply using diaphragmatic breathing. 3. Try to hold your breath for 3 seconds. 4. Keep your mouth slightly open while coughing twice. 5. Spit any mucus out into a tissue. 6. Rest and repeat the steps once or twice as needed. SEEK MEDICAL CARE IF:  You are coughing up more mucus than usual.  There is a change in the color or thickness of your mucus.  Your breathing is more labored than usual.  Your breathing is faster than usual. SEEK IMMEDIATE MEDICAL CARE IF:  You have shortness of breath while you are resting.  You have shortness of breath that prevents you from:  Being able to talk.  Performing your usual physical activities.  You have chest pain lasting longer than 5 minutes.  Your skin color is more cyanotic than usual.  You measure low oxygen saturations for longer than 5 minutes with a pulse oximeter. MAKE SURE YOU:  Understand these instructions.  Will watch your condition.  Will get help right away if you are not doing well or get worse.   This information is not intended to replace advice given to you by your health care provider. Make sure you discuss any questions you have with your health care provider.   Document Released: 03/12/2005 Document Revised: 06/23/2014 Document Reviewed: 01/27/2013 Elsevier Interactive Patient Education 2016 [employer]  

## 2015-07-11 NOTE — Progress Notes (Signed)
[facility] Consultation      Date: 07/11/2015,   MRN# [id number] Marcus Williams 08/14/1955 Code Status:  Hosp day:@LENGTHOFSTAYDAYS @ Referring MD: @ATDPROV @     PCP:      AdmissionWeight: 275 lb 9.6 oz (125.011 kg)                 CurrentWeight: 275 lb 9.6 oz (125.011 kg) Marcus Williams is a 60 y.o. old male seen in consultation for chronic SOB     CHIEF COMPLAINT:   SOB, DOE    HISTORY OF PRESENT ILLNESS  Patient doing well overall, used albuterol last month Lost about 16 pounds since last visit Sleep assessment pending No signs of infection at this time    Current Medication:  Current outpatient prescriptions:  .  acetaminophen (TYLENOL) 325 MG tablet, Take 2 tablets (650 mg total) by mouth every 6 (six) hours as needed for fever., Disp: , Rfl:  .  albuterol (PROVENTIL HFA;VENTOLIN HFA) 108 (90 BASE) MCG/ACT inhaler, Inhale 2 puffs into the lungs every 6 (six) hours as needed for wheezing or shortness of breath., Disp: 1 Inhaler, Rfl: 0 .  ALPRAZolam (XANAX) 0.25 MG tablet, Take 0.25 mg by mouth 2 (two) times daily as needed for anxiety., Disp: , Rfl:  .  aspirin EC 325 MG tablet, Take 325 mg by mouth daily., Disp: , Rfl:  .  ciprofloxacin (CIPRO) 500 MG tablet, Take 500 mg by mouth 2 (two) times daily., Disp: , Rfl:  .  diltiazem (CARDIZEM CD) 300 MG 24 hr capsule, Take 1 capsule (300 mg total) by mouth daily., Disp: 30 capsule, Rfl: 5 .  fluticasone (FLONASE) 50 MCG/ACT nasal spray, Place 1 spray into both nostrils daily., Disp: , Rfl:  .  Fluticasone-Salmeterol (ADVAIR DISKUS) 250-50 MCG/DOSE AEPB, Inhale 1 puff into the lungs 2 (two) times daily., Disp: , Rfl:  .  furosemide (LASIX) 40 MG tablet, Take 1 tablet (40 mg total) by mouth 2 (two) times daily., Disp: 60 tablet, Rfl: 5 .  glimepiride (AMARYL) 4 MG tablet, Take 4 mg by mouth daily., Disp: , Rfl:  .  insulin NPH Human (HUMULIN N,NOVOLIN N) 100 UNIT/ML injection, Inject 40 Units into the skin  2 (two) times daily., Disp: , Rfl:  .  ipratropium-albuterol (DUONEB) 0.5-2.5 (3) MG/3ML SOLN, Take 3 mLs by nebulization every 4 (four) hours as needed (for shortness of breath)., Disp: , Rfl:  .  metFORMIN (GLUCOPHAGE) 1000 MG tablet, Take 1,000 mg by mouth 2 (two) times daily with a meal., Disp: , Rfl:  .  metoprolol tartrate (LOPRESSOR) 25 MG tablet, Take 1 tablet (25 mg total) by mouth 2 (two) times daily., Disp: 60 tablet, Rfl: 5 .  nitroGLYCERIN (NITROSTAT) 0.4 MG SL tablet, Place 0.4 mg under the tongue every 5 (five) minutes as needed for chest pain., Disp: , Rfl:  .  oxyCODONE-acetaminophen (PERCOCET/ROXICET) 5-325 MG tablet, Take 1-2 tablets by mouth every 6 (six) hours as needed for moderate pain or severe pain., Disp: 5 tablet, Rfl: 0 .  pantoprazole (PROTONIX) 40 MG tablet, Take 40 mg by mouth daily., Disp: , Rfl:  .  pramipexole (MIRAPEX) 0.5 MG tablet, Take 0.5 mg by mouth at bedtime., Disp: , Rfl:  .  pregabalin (LYRICA) 75 MG capsule, Take 75 mg by mouth 2 (two) times daily., Disp: , Rfl:  .  simvastatin (ZOCOR) 40 MG tablet, Take 40 mg by mouth at bedtime. , Disp: , Rfl:  .  traZODone (DESYREL) 100  MG tablet, Take 100 mg by mouth at bedtime., Disp: , Rfl:  .  XARELTO 15 MG TABS tablet, Take 15 mg by mouth every evening. , Disp: , Rfl:     ALLERGIES   Review of patient's allergies indicates no known allergies.     REVIEW OF SYSTEMS   Review of Systems  Constitutional: Positive for weight loss. Negative for fever, chills, malaise/fatigue and diaphoresis.  Respiratory: Negative for cough, hemoptysis, sputum production, shortness of breath and wheezing.   Cardiovascular: Negative for chest pain and leg swelling.  Gastrointestinal: Negative.   Skin:       Skin tears on arms  All other systems reviewed and are negative.    VS: BP 134/66 mmHg  Pulse 63  Ht 5\' 8"  (1.727 m)  Wt 275 lb 9.6 oz (125.011 kg)  BMI 41.91 kg/m2  SpO2 98%     PHYSICAL EXAM  Physical  Exam  Constitutional: He is oriented to person, place, and time. He appears well-developed and well-nourished. No distress.  HENT:  Head: Normocephalic and atraumatic.  Mouth/Throat: No oropharyngeal exudate.  Cardiovascular: Normal rate and regular rhythm.   Murmur heard. Pulmonary/Chest: No respiratory distress. He has no wheezes.  Musculoskeletal: Normal range of motion. He exhibits no edema.  Neurological: He is alert and oriented to person, place, and time.  Skin: He is not diaphoretic.     ASSESSMENT/PLAN   60 yo obese white male with chronic SOB and DOE which likely related to multiple problems including CAD,OSA,mild COPD and his obesity.  I have explained that patient will have chronic SOB and DOE due to his underlying medical conditions Patient doing well overall with weight loss, patient will need oxygen at night  1.oxygen at night 2.albuterol as needed for mild COPD 3.continue CPAP for OSA-re-assessment pending  Follow up in 3 months   The Patient requires high complexity decision making for assessment and support, frequent evaluation and titration of therapies, application of advanced monitoring technologies and extensive interpretation of multiple databases.  Patient satisfied with Plan of action and management. All questions answered  [name], M.D.  [facility]  Medical Director [place] Director [facility] Cardio-Pulmonary Department

## 2015-07-11 NOTE — Addendum Note (Signed)
Addended by: Maryanna Shape A on: 07/11/2015 11:05 AM   Modules accepted: Orders

## 2015-07-18 DIAGNOSIS — I10 Essential (primary) hypertension: Secondary | ICD-10-CM | POA: Diagnosis not present

## 2015-07-18 DIAGNOSIS — Z79899 Other long term (current) drug therapy: Secondary | ICD-10-CM | POA: Diagnosis not present

## 2015-07-18 DIAGNOSIS — K219 Gastro-esophageal reflux disease without esophagitis: Secondary | ICD-10-CM | POA: Diagnosis not present

## 2015-07-18 DIAGNOSIS — E7800 Pure hypercholesterolemia, unspecified: Secondary | ICD-10-CM | POA: Diagnosis not present

## 2015-07-18 DIAGNOSIS — E1143 Type 2 diabetes mellitus with diabetic autonomic (poly)neuropathy: Secondary | ICD-10-CM | POA: Diagnosis not present

## 2015-07-18 DIAGNOSIS — I480 Paroxysmal atrial fibrillation: Secondary | ICD-10-CM | POA: Diagnosis not present

## 2015-07-18 DIAGNOSIS — G4733 Obstructive sleep apnea (adult) (pediatric): Secondary | ICD-10-CM | POA: Diagnosis not present

## 2015-07-18 DIAGNOSIS — I482 Chronic atrial fibrillation: Secondary | ICD-10-CM | POA: Diagnosis not present

## 2015-07-23 DIAGNOSIS — Z794 Long term (current) use of insulin: Secondary | ICD-10-CM | POA: Diagnosis not present

## 2015-07-23 DIAGNOSIS — S9031XD Contusion of right foot, subsequent encounter: Secondary | ICD-10-CM | POA: Diagnosis not present

## 2015-07-23 DIAGNOSIS — S92511D Displaced fracture of proximal phalanx of right lesser toe(s), subsequent encounter for fracture with routine healing: Secondary | ICD-10-CM | POA: Diagnosis not present

## 2015-07-23 DIAGNOSIS — S90121D Contusion of right lesser toe(s) without damage to nail, subsequent encounter: Secondary | ICD-10-CM | POA: Diagnosis not present

## 2015-07-23 DIAGNOSIS — E1140 Type 2 diabetes mellitus with diabetic neuropathy, unspecified: Secondary | ICD-10-CM | POA: Diagnosis not present

## 2015-08-02 ENCOUNTER — Other Ambulatory Visit: Payer: Self-pay

## 2015-08-13 DIAGNOSIS — S92511D Displaced fracture of proximal phalanx of right lesser toe(s), subsequent encounter for fracture with routine healing: Secondary | ICD-10-CM | POA: Diagnosis not present

## 2015-08-13 DIAGNOSIS — M79671 Pain in right foot: Secondary | ICD-10-CM | POA: Diagnosis not present

## 2015-09-05 ENCOUNTER — Other Ambulatory Visit
Admission: RE | Admit: 2015-09-05 | Discharge: 2015-09-05 | Disposition: A | Discharge status: Home / Self Care | Payer: Commercial Managed Care - HMO | Source: Ambulatory Visit | Attending: Physician Assistant | Admitting: Physician Assistant

## 2015-09-05 DIAGNOSIS — R0609 Other forms of dyspnea: Secondary | ICD-10-CM | POA: Insufficient documentation

## 2015-09-05 DIAGNOSIS — Z9989 Dependence on other enabling machines and devices: Secondary | ICD-10-CM | POA: Diagnosis not present

## 2015-09-05 DIAGNOSIS — R079 Chest pain, unspecified: Secondary | ICD-10-CM | POA: Insufficient documentation

## 2015-09-05 DIAGNOSIS — I480 Paroxysmal atrial fibrillation: Secondary | ICD-10-CM | POA: Diagnosis not present

## 2015-09-05 DIAGNOSIS — I25810 Atherosclerosis of coronary artery bypass graft(s) without angina pectoris: Secondary | ICD-10-CM | POA: Diagnosis not present

## 2015-09-05 DIAGNOSIS — E7800 Pure hypercholesterolemia, unspecified: Secondary | ICD-10-CM | POA: Diagnosis not present

## 2015-09-05 DIAGNOSIS — G4733 Obstructive sleep apnea (adult) (pediatric): Secondary | ICD-10-CM | POA: Diagnosis not present

## 2015-09-05 DIAGNOSIS — E1143 Type 2 diabetes mellitus with diabetic autonomic (poly)neuropathy: Secondary | ICD-10-CM | POA: Diagnosis not present

## 2015-09-05 DIAGNOSIS — I10 Essential (primary) hypertension: Secondary | ICD-10-CM | POA: Diagnosis not present

## 2015-09-05 DIAGNOSIS — J0110 Acute frontal sinusitis, unspecified: Secondary | ICD-10-CM | POA: Diagnosis not present

## 2015-09-05 DIAGNOSIS — J90 Pleural effusion, not elsewhere classified: Secondary | ICD-10-CM | POA: Diagnosis not present

## 2015-09-05 LAB — TROPONIN I

## 2015-09-10 ENCOUNTER — Other Ambulatory Visit: Payer: Self-pay | Admitting: Internal Medicine

## 2015-09-10 DIAGNOSIS — S92511D Displaced fracture of proximal phalanx of right lesser toe(s), subsequent encounter for fracture with routine healing: Secondary | ICD-10-CM | POA: Diagnosis not present

## 2015-09-10 DIAGNOSIS — R0789 Other chest pain: Secondary | ICD-10-CM | POA: Diagnosis not present

## 2015-09-10 DIAGNOSIS — R0602 Shortness of breath: Secondary | ICD-10-CM | POA: Diagnosis not present

## 2015-09-10 DIAGNOSIS — R109 Unspecified abdominal pain: Secondary | ICD-10-CM

## 2015-09-10 DIAGNOSIS — M545 Low back pain: Secondary | ICD-10-CM | POA: Diagnosis not present

## 2015-09-19 ENCOUNTER — Ambulatory Visit
Admission: RE | Admit: 2015-09-19 | Discharge: 2015-09-19 | Disposition: A | Discharge status: Home / Self Care | Payer: Commercial Managed Care - HMO | Source: Ambulatory Visit | Attending: Internal Medicine | Admitting: Internal Medicine

## 2015-09-19 DIAGNOSIS — R079 Chest pain, unspecified: Secondary | ICD-10-CM | POA: Insufficient documentation

## 2015-09-19 DIAGNOSIS — Z951 Presence of aortocoronary bypass graft: Secondary | ICD-10-CM | POA: Insufficient documentation

## 2015-09-19 DIAGNOSIS — R0602 Shortness of breath: Secondary | ICD-10-CM

## 2015-09-19 DIAGNOSIS — R918 Other nonspecific abnormal finding of lung field: Secondary | ICD-10-CM | POA: Diagnosis not present

## 2015-09-19 DIAGNOSIS — R0789 Other chest pain: Secondary | ICD-10-CM | POA: Diagnosis not present

## 2015-09-19 DIAGNOSIS — R0609 Other forms of dyspnea: Secondary | ICD-10-CM | POA: Diagnosis not present

## 2015-09-21 ENCOUNTER — Ambulatory Visit
Admission: RE | Admit: 2015-09-21 | Discharge: 2015-09-21 | Disposition: A | Discharge status: Home / Self Care | Payer: Commercial Managed Care - HMO | Source: Ambulatory Visit | Attending: Internal Medicine | Admitting: Internal Medicine

## 2015-09-21 ENCOUNTER — Ambulatory Visit: Admission: RE | Admit: 2015-09-21 | Payer: Commercial Managed Care - HMO | Source: Ambulatory Visit

## 2015-09-21 DIAGNOSIS — R1010 Upper abdominal pain, unspecified: Secondary | ICD-10-CM | POA: Diagnosis not present

## 2015-09-21 DIAGNOSIS — R109 Unspecified abdominal pain: Secondary | ICD-10-CM | POA: Diagnosis not present

## 2015-09-24 ENCOUNTER — Other Ambulatory Visit: Payer: Self-pay | Admitting: Cardiology

## 2015-09-24 DIAGNOSIS — I480 Paroxysmal atrial fibrillation: Secondary | ICD-10-CM | POA: Diagnosis not present

## 2015-09-24 DIAGNOSIS — Q231 Congenital insufficiency of aortic valve: Secondary | ICD-10-CM | POA: Insufficient documentation

## 2015-09-24 DIAGNOSIS — E7800 Pure hypercholesterolemia, unspecified: Secondary | ICD-10-CM | POA: Diagnosis not present

## 2015-09-24 DIAGNOSIS — I25810 Atherosclerosis of coronary artery bypass graft(s) without angina pectoris: Secondary | ICD-10-CM | POA: Diagnosis not present

## 2015-09-24 DIAGNOSIS — I10 Essential (primary) hypertension: Secondary | ICD-10-CM | POA: Diagnosis not present

## 2015-09-24 DIAGNOSIS — G4733 Obstructive sleep apnea (adult) (pediatric): Secondary | ICD-10-CM | POA: Diagnosis not present

## 2015-09-24 DIAGNOSIS — Z9989 Dependence on other enabling machines and devices: Secondary | ICD-10-CM | POA: Diagnosis not present

## 2015-09-25 DIAGNOSIS — I480 Paroxysmal atrial fibrillation: Secondary | ICD-10-CM | POA: Diagnosis not present

## 2015-09-27 ENCOUNTER — Other Ambulatory Visit: Payer: Self-pay | Admitting: Cardiology

## 2015-10-02 ENCOUNTER — Encounter
Admission: RE | Disposition: A | Discharge status: Home / Self Care | Payer: Self-pay | Source: Ambulatory Visit | Attending: Cardiology

## 2015-10-02 ENCOUNTER — Encounter: Payer: Self-pay | Admitting: Cardiology

## 2015-10-02 ENCOUNTER — Ambulatory Visit
Admission: RE | Admit: 2015-10-02 | Discharge: 2015-10-02 | Disposition: A | Discharge status: Home / Self Care | Payer: Commercial Managed Care - HMO | Source: Ambulatory Visit | Attending: Cardiology | Admitting: Cardiology

## 2015-10-02 DIAGNOSIS — E1143 Type 2 diabetes mellitus with diabetic autonomic (poly)neuropathy: Secondary | ICD-10-CM | POA: Insufficient documentation

## 2015-10-02 DIAGNOSIS — I25110 Atherosclerotic heart disease of native coronary artery with unstable angina pectoris: Secondary | ICD-10-CM | POA: Diagnosis not present

## 2015-10-02 DIAGNOSIS — M5136 Other intervertebral disc degeneration, lumbar region: Secondary | ICD-10-CM | POA: Insufficient documentation

## 2015-10-02 DIAGNOSIS — Z794 Long term (current) use of insulin: Secondary | ICD-10-CM | POA: Insufficient documentation

## 2015-10-02 DIAGNOSIS — F329 Major depressive disorder, single episode, unspecified: Secondary | ICD-10-CM | POA: Diagnosis not present

## 2015-10-02 DIAGNOSIS — I471 Supraventricular tachycardia: Secondary | ICD-10-CM | POA: Insufficient documentation

## 2015-10-02 DIAGNOSIS — Z836 Family history of other diseases of the respiratory system: Secondary | ICD-10-CM | POA: Diagnosis not present

## 2015-10-02 DIAGNOSIS — I350 Nonrheumatic aortic (valve) stenosis: Secondary | ICD-10-CM | POA: Diagnosis not present

## 2015-10-02 DIAGNOSIS — Z8249 Family history of ischemic heart disease and other diseases of the circulatory system: Secondary | ICD-10-CM | POA: Insufficient documentation

## 2015-10-02 DIAGNOSIS — Z833 Family history of diabetes mellitus: Secondary | ICD-10-CM | POA: Diagnosis not present

## 2015-10-02 DIAGNOSIS — I509 Heart failure, unspecified: Secondary | ICD-10-CM | POA: Diagnosis not present

## 2015-10-02 DIAGNOSIS — Z6841 Body Mass Index (BMI) 40.0 and over, adult: Secondary | ICD-10-CM | POA: Diagnosis not present

## 2015-10-02 DIAGNOSIS — I4891 Unspecified atrial fibrillation: Secondary | ICD-10-CM | POA: Diagnosis not present

## 2015-10-02 DIAGNOSIS — I259 Chronic ischemic heart disease, unspecified: Secondary | ICD-10-CM | POA: Insufficient documentation

## 2015-10-02 DIAGNOSIS — Z7951 Long term (current) use of inhaled steroids: Secondary | ICD-10-CM | POA: Diagnosis not present

## 2015-10-02 DIAGNOSIS — E11319 Type 2 diabetes mellitus with unspecified diabetic retinopathy without macular edema: Secondary | ICD-10-CM | POA: Diagnosis not present

## 2015-10-02 DIAGNOSIS — G4733 Obstructive sleep apnea (adult) (pediatric): Secondary | ICD-10-CM | POA: Insufficient documentation

## 2015-10-02 DIAGNOSIS — Z8619 Personal history of other infectious and parasitic diseases: Secondary | ICD-10-CM | POA: Diagnosis not present

## 2015-10-02 DIAGNOSIS — Z79899 Other long term (current) drug therapy: Secondary | ICD-10-CM | POA: Insufficient documentation

## 2015-10-02 DIAGNOSIS — E785 Hyperlipidemia, unspecified: Secondary | ICD-10-CM | POA: Diagnosis not present

## 2015-10-02 DIAGNOSIS — Z86010 Personal history of colonic polyps: Secondary | ICD-10-CM | POA: Insufficient documentation

## 2015-10-02 DIAGNOSIS — R079 Chest pain, unspecified: Secondary | ICD-10-CM | POA: Diagnosis not present

## 2015-10-02 DIAGNOSIS — I110 Hypertensive heart disease with heart failure: Secondary | ICD-10-CM | POA: Diagnosis not present

## 2015-10-02 DIAGNOSIS — Z7982 Long term (current) use of aspirin: Secondary | ICD-10-CM | POA: Insufficient documentation

## 2015-10-02 DIAGNOSIS — Z951 Presence of aortocoronary bypass graft: Secondary | ICD-10-CM | POA: Insufficient documentation

## 2015-10-02 DIAGNOSIS — I200 Unstable angina: Secondary | ICD-10-CM | POA: Diagnosis not present

## 2015-10-02 DIAGNOSIS — R0602 Shortness of breath: Secondary | ICD-10-CM | POA: Insufficient documentation

## 2015-10-02 DIAGNOSIS — Z7901 Long term (current) use of anticoagulants: Secondary | ICD-10-CM | POA: Diagnosis not present

## 2015-10-02 HISTORY — PX: CARDIAC CATHETERIZATION: SHX172

## 2015-10-02 SURGERY — LEFT HEART CATH AND CORS/GRAFTS ANGIOGRAPHY
Anesthesia: Moderate Sedation

## 2015-10-02 MED ORDER — SODIUM CHLORIDE 0.9 % WEIGHT BASED INFUSION: 3.0000 mL/kg/h | INTRAVENOUS | Status: DC

## 2015-10-02 MED ORDER — ALPRAZOLAM 0.5 MG PO TABS
ORAL_TABLET | ORAL | Status: AC
  Filled 2015-10-02: qty 1

## 2015-10-02 MED ORDER — SODIUM CHLORIDE 0.9 % IV SOLN: 250.0000 mL | INTRAVENOUS | Status: DC | PRN

## 2015-10-02 MED ORDER — SODIUM CHLORIDE 0.9 % WEIGHT BASED INFUSION
1.0000 mL/kg/h | INTRAVENOUS | Status: DC
Start: 2015-10-03 — End: 2015-10-02

## 2015-10-02 MED ORDER — FENTANYL CITRATE (PF) 100 MCG/2ML IJ SOLN
INTRAMUSCULAR | Status: DC | PRN
  Administered 2015-10-02: 25 ug via INTRAVENOUS

## 2015-10-02 MED ORDER — SODIUM CHLORIDE 0.9% FLUSH: 3.0000 mL | Freq: Two times a day (BID) | INTRAVENOUS | Status: DC

## 2015-10-02 MED ORDER — ALPRAZOLAM 0.5 MG PO TABS
0.2500 mg | ORAL_TABLET | Freq: Once | ORAL | Status: AC | PRN
  Administered 2015-10-02: 0.25 mg via ORAL

## 2015-10-02 MED ORDER — SODIUM CHLORIDE 0.9% FLUSH: 3.0000 mL | INTRAVENOUS | Status: DC | PRN

## 2015-10-02 MED ORDER — FENTANYL CITRATE (PF) 100 MCG/2ML IJ SOLN
INTRAMUSCULAR | Status: AC
  Filled 2015-10-02: qty 2

## 2015-10-02 MED ORDER — MIDAZOLAM HCL 2 MG/2ML IJ SOLN
INTRAMUSCULAR | Status: AC
  Filled 2015-10-02: qty 2

## 2015-10-02 MED ORDER — MIDAZOLAM HCL 2 MG/2ML IJ SOLN
INTRAMUSCULAR | Status: DC | PRN
  Administered 2015-10-02 (×2): 1 mg via INTRAVENOUS

## 2015-10-02 MED ORDER — IOPAMIDOL (ISOVUE-300) INJECTION 61%
INTRAVENOUS | Status: DC | PRN
  Administered 2015-10-02: 170 mL via INTRA_ARTERIAL

## 2015-10-02 MED ORDER — HEPARIN (PORCINE) IN NACL 2-0.9 UNIT/ML-% IJ SOLN
INTRAMUSCULAR | Status: AC
  Filled 2015-10-02: qty 1000

## 2015-10-02 SURGICAL SUPPLY — 17 items
CATH INFINITI 5 FR IM (CATHETERS) ×3 IMPLANT
CATH INFINITI 5FR ANG PIGTAIL (CATHETERS) ×3 IMPLANT
CATH INFINITI 5FR JL4 (CATHETERS) ×3 IMPLANT
CATH INFINITI 5FR JL5 (CATHETERS) ×3 IMPLANT
CATH INFINITI JR4 5F (CATHETERS) ×3 IMPLANT
CATH SWANZ 7F THERMO (CATHETERS) ×3 IMPLANT
DEVICE CLOSURE MYNXGRIP 5F (Vascular Products) ×3 IMPLANT
DEVICE SAFEGUARD 24CM (GAUZE/BANDAGES/DRESSINGS) ×3 IMPLANT
KIT MANI 3VAL PERCEP (MISCELLANEOUS) ×3 IMPLANT
KIT RIGHT HEART (MISCELLANEOUS) ×3 IMPLANT
NEEDLE PERC 18GX7CM (NEEDLE) ×3 IMPLANT
PACK CARDIAC CATH (CUSTOM PROCEDURE TRAY) ×3 IMPLANT
SHEATH AVANTI 5FR X 11CM (SHEATH) ×3 IMPLANT
SHEATH PINNACLE 7F 10CM (SHEATH) ×3 IMPLANT
WIRE EMERALD 3MM-J .035X150CM (WIRE) ×3 IMPLANT
WIRE EMERALD 3MM-J .035X260CM (WIRE) ×3 IMPLANT
WIRE EMERALD ST .035X150CM (WIRE) ×3 IMPLANT

## 2015-10-02 NOTE — H&P (Signed)
Chief Complaint: Chief Complaint  Patient presents with  . Chest Pain  per dr sparks  Date of Service: 09/24/2015 Date of Birth: [birth year] PCP: Idelle Crouch, MD, MD  History of Present Illness: Mr. Latka is a 60 y.o.male patient who returns for follow-up visit. He has a history of coronary artery disease status post coronary artery bypass grafting with a left internal mammary to the LAD, saphenous vein graft to the PDA, OM 2 and D2. He is status post angioplasty of the saphenous vein graft to the first diagonal done in 2007. He has been having shortness of breath pretty much continuously since that time. He also complains of chest tightness and tingling in his left chest with activity. He has complained of shortness of breath weakness and fatigue for some time. Chest CT revealed no significant abnormalities explaining his symptoms. He had a 4.0 cm mid ascending thoracic aorta. Patient also has a history of a bicuspid aortic valve with a systolic murmur. He denies lightheadedness shortness. He has chest discomfort and shortness of breath at rest consistent with unstable angina. Will need to proceed with a left and right heart cath to evaluate coronary anatomy. We also need to evaluate his pulmonary pressures and considered crossing the aortic valve for further treatment options. Past Medical and Surgical History  Past Medical History Past Medical History  Diagnosis Date  . Arrhythmia  recent hospitalization for SVT required adenosine, positive troponin 08-25-2012  . Atrial fibrillation , unspecified (CMS-HCC)  . CHF (congestive heart failure) (CMS-HCC)  . Coronary artery disease  4/06, four-vessel coronary artery bypass graft with the left internal mammary artery to the left anterior descending, vein graft to the posterior descending artery, obtuse marginal-2, and D2.  . DDD (degenerative disc disease), lumbar  . Depression, unspecified  . Diabetic foot ulcer (CMS-HCC) 01/2013  Right great  toe  . Diabetic peripheral neuropathy associated with type 2 diabetes mellitus (CMS-HCC)  . Diabetic retinopathy, nonproliferative, mild (CMS-HCC)  mild NPDR, eye exam 10/2011 [facility]  . Gastritis  . H/O Bell's palsy  . Heart block  . History of adenomatous polyp of colon  . History of chickenpox  . Hyperlipidemia, unspecified  . Hypertension  . Long-term insulin use in type 2 diabetes (CMS-HCC)  . Obesity  morbid obesity  . OSA on CPAP  . Steatohepatitis  . SVT (supraventricular tachycardia) (CMS-HCC)  Hospitalization for SVT, required adenosine, positive troponin 08/25/12  . 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 Type 2 diabetes with ophthalmic manifestation (CMS-HCC) DM diagnosis 2001   Past Surgical History He has a past surgical history that includes Tonsillectomy; Foot bone excision (Left); ASCVD; Knee arthroscopy (Right); Arthroscopy, right knee, partial medial and lateral meniscectomy (Right, 12/22/13); Polypectomy; Colonoscopy (05/08/2004); capsule endoscopy (05/08/04, 06/11/09, 09/12/10); Colonoscopy (06/11/2009); Colonoscopy (05/03/2014); egd (05/03/2014); and Coronary artery bypass graft (09/2004).   Medications and Allergies  Current Medications  Current Outpatient Prescriptions  Medication Sig Dispense Refill  . aspirin 325 MG tablet Take 325 mg by mouth once daily.  . blood glucose diagnostic test strip Check blood sugar 3x a day. Patient needs test strips for One Touch Ultra meter 100 each 11  . CARTIA XT 300 mg CD capsule TAKE ONE CAPSULE DAILY 30 capsule 3  . fluticasone (FLONASE) 50 mcg/actuation nasal spray Place 2 sprays into both nostrils once daily.  . fluticasone-salmeterol (ADVAIR DISKUS) 250-50 mcg/dose diskus inhaler Inhale into the lungs.  . FUROsemide (LASIX) 40 MG tablet Take  1 tablet (40 mg total) by mouth 2 (two) times daily. 60 tablet 6  . HYDROcodone-acetaminophen (NORCO) 5-325 mg tablet Take 1 tablet by mouth  every 6 (six) hours as needed for Pain. 20 tablet 0  . insulin NPH (NOVOLIN N) 100 unit/mL injection Inject 60 Units subcutaneously 2 (two) times daily before meals. 70 q am 100mg  qhs 30 mL 11  . ipratropium-albuterol (DUO-NEB) nebulizer solution Take 3 mLs by nebulization 4 (four) times daily. 360 mL 11  . metFORMIN (GLUCOPHAGE) 1000 MG tablet Take 1 tablet (1,000 mg total) by mouth 2 (two) times daily. 60 tablet 5  . metFORMIN (GLUCOPHAGE) 1000 MG tablet TAKE 1 TABLET (1,000 MG TOTAL) BY MOUTH 2 (TWO) TIMES DAILY. 60 tablet 11  . metoprolol tartrate (LOPRESSOR) 25 MG tablet Take 1 tablet (25 mg total) by mouth 2 (two) times daily. 60 tablet 4  . montelukast (SINGULAIR) 10 mg tablet Take 10 mg by mouth nightly.  . nitroGLYcerin (NITROSTAT) 0.4 MG SL tablet Place 0.4 mg under the tongue every 5 (five) minutes as needed for Chest pain. May take up to 3 doses.  Marland Kitchen oxyCODONE-acetaminophen (PERCOCET) 5-325 mg tablet Take 5-325 tablets by mouth every 6 (six) hours.  . pantoprazole (PROTONIX) 40 MG DR tablet Take 1 tablet (40 mg total) by mouth every morning. 30 tablet 5  . pramipexole (MIRAPEX) 0.5 MG tablet TAKE 1 TABLET EVERY 8 HOURS AS NEEDED FOR RESTLESS TO LEGS 60 tablet 4  . pregabalin (LYRICA) 75 MG capsule 1 po qAM and 2qHS x 4 days, then 2 bid thereafter. 360 capsule 1  . rivaroxaban (XARELTO) 15 mg tablet Take 1 tablet (15 mg total) by mouth daily with breakfast. 30 tablet 5  . simvastatin (ZOCOR) 40 MG tablet Take 1 tablet (40 mg total) by mouth nightly. 30 tablet 5  . traZODone (DESYREL) 100 MG tablet TAKE 1 TABLET EVERY NIGHT AT BEDTIME 30 tablet 3   No current facility-administered medications for this visit.   Allergies: Review of patient's allergies indicates no known allergies.  Social and Family History  Social History reports that he has never smoked. He has never used smokeless tobacco. He reports that he does not drink alcohol or use illicit drugs.  Family History Family  History  Problem Relation Age of Onset  . Stroke Mother  . Heart attack Mother  . Hypertension Mother  . Heart attack Father  . Hypertension Father  . Heart attack Brother  CABG in his 17s  . Diabetes mellitus Brother  . No Known Problems Daughter  . No Known Problems Daughter  . Heart disease Brother  . Lung disease Brother  . Hypertension Brother  . Diabetes mellitus Brother  . Thyroid disease Neg Hx  . Cancer Neg Hx  no malignancies   Review of Systems  Review of Systems  Constitutional: Negative for chills, diaphoresis, fever and weight loss.  HENT: Positive for congestion. Negative for ear discharge, hearing loss and tinnitus.  Eyes: Negative for blurred vision.  Respiratory: Positive for cough and shortness of breath. Negative for hemoptysis, sputum production and wheezing.  Cardiovascular: Positive for chest pain. Negative for palpitations, orthopnea, claudication and PND.  Gastrointestinal: Negative for blood in stool, constipation, diarrhea, heartburn, melena, nausea and vomiting.  Genitourinary: Negative for dysuria, frequency, hematuria and urgency.  Musculoskeletal: Negative for back pain, falls, joint pain and myalgias.  Skin: Negative for itching and rash.  Neurological: Positive for headaches. Negative for dizziness, tingling, focal weakness and loss of consciousness.  Endo/Heme/Allergies: Negative for polydipsia. Does not bruise/bleed easily.  Psychiatric/Behavioral: Negative for depression, memory loss and substance abuse. The patient is nervous/anxious.    Physical Examination   Vitals: Visit Vitals  . BP 160/80  . Pulse 68  . Resp 12  . Ht 175.3 cm (5\' 9" )  . Wt (!) 127 kg (280 lb)  . BMI 41.35 kg/m2   Ht:175.3 cm (5\' 9" ) Wt:(!) 127 kg (280 lb) [id number] surface area is 2.49 meters squared. Body mass index is 41.35 kg/(m^2).  Wt Readings from Last 3 Encounters:  09/24/15 (!) 127 kg (280 lb)  09/19/15 (!) 127.5 kg (281 lb)  09/10/15 (!) 128.4 kg  (283 lb)   BP Readings from Last 3 Encounters:  09/24/15 160/80  09/19/15 142/72  09/10/15 144/72   General appearance appears in no acute distress  Head Mouth and Eye exam Normocephalic, without obvious abnormality, atraumatic Dentition is good Eyes appear anicteric   Neck exam Thyroid: normal  Nodes: no obvious adenopathy  LUNGS Breath Sounds: Normal Percussion: Normal  CARDIOVASCULAR JVP CV wave: no HJR: no Elevation at 90 degrees: None Carotid Pulse: normal pulsation bilaterally Bruit: None Apex: apical impulse normal  Auscultation Rhythm: normal sinus rhythm S1: normal S2: normal Clicks: no Rub: no [id number] systollic murmur radiating to outflow tract Gallop: None ABDOMEN Liver enlargement: no Pulsatile aorta: no Ascites: no Bruits: no  EXTREMITIES Clubbing: no Edema: 1+ bilateral pedal edema Pulses: peripheral pulses symmetrical Femoral Bruits: no Amputation: no SKIN Rash: no Cyanosis: no Embolic phemonenon: no Bruising: no NEURO Alert and Oriented to person, place and time: yes Non focal: yes  PSYCH: Pt appears to have normal affect  LABS REVIEWED Last 3 CBC results: Lab Results  Component Value Date  WBC 8.9 09/05/2015  WBC 8.4 07/18/2015  WBC 6.7 05/14/2015   Lab Results  Component Value Date  HGB 10.6 (L) 09/05/2015  HGB 11.9 (L) 07/18/2015  HGB 9.6 (L) 05/14/2015   Lab Results  Component Value Date  HCT 35.3 (L) 09/05/2015  HCT 40.1 07/18/2015  HCT 32.2 (L) 05/14/2015   Lab Results  Component Value Date  PLT 185 09/05/2015  PLT 260 07/18/2015  PLT 218 05/14/2015   Lab Results  Component Value Date  CREATININE 0.9 09/05/2015  BUN 14 09/05/2015  NA 140 09/05/2015  K 4.0 09/05/2015  CL 103 09/05/2015  CO2 28.4 09/05/2015   Lab Results  Component Value Date  HGBA1C 9.8 (H) 07/18/2015   Lab Results  Component Value Date  HDL 41.5 07/18/2015  HDL 40.0 09/21/2014   Lab Results  Component Value Date   LDLCALC 80 07/18/2015  LDLCALC 69 09/21/2014   Lab Results  Component Value Date  TRIG 146 07/18/2015  TRIG 99 09/21/2014   Lab Results  Component Value Date  ALT 29 07/18/2015  AST 23 07/18/2015  ALKPHOS 76 07/18/2015   Assessment and Plan   60 y.o. male with  ICD-10-CM ICD-9-CM  1. Atrial fibrillation,-out sinus rhythm status post cardioversion. Would continue with Cardizem and Xarelto. Will stop Xarelto 2 days prior to cardiac catheterization and resume post procedure. I48.91 427.31   2. Coronary artery disease involving coronary bypass graft of native heart without angina pectoris-chest pain is atypical. EKG does not suggest ischemia. We will continue with current antianginal regimen including as needed nitroglycerin as well as metoprolol. We will proceed with a left and right heart cath to evaluate coronary anatomy getting rest and exertional chest pain. I25.810 414.05  3. Converted to sinus rhythm-as  per above. Will remain on Cardizem Xarelto. Will remain off of amiodarone. Will continue with metoprolol tartrate at 25 mg.  4. Hyperlipidemia, unspecified hyperlipidemia type. Will continue with simvastatin at 40 mg daily with an LDL goal of less than 100. Most recent value was 80. Low-fat diet is also recommended E78.5 272.4  5. Essential hypertension, benign-continue with metoprolol and Cardizem as well as a DASH diet I10 401.1  6. Atherosclerosis of native coronary artery of native heart with unstable angina pectoris-cardiac cath as per above I25.110 414.01  411.1  7. 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 Most recent hemoglobin A1c was 9.8. Adherence to diet is recommended E11.43 250.60  337.1  8.. Bicuspid aortic valve-we will proceed with an echocardiogram to reevaluate this and consider right and left heart cath to evaluate pulmonary pressures and aortic gradient.  Return in about 4 weeks (around 10/22/2015).  These  notes generated with voice recognition software. I apologize for typographical errors.  Sydnee Levans, MD

## 2015-10-02 NOTE — Discharge Instructions (Signed)

## 2015-10-04 DIAGNOSIS — E1143 Type 2 diabetes mellitus with diabetic autonomic (poly)neuropathy: Secondary | ICD-10-CM | POA: Diagnosis not present

## 2015-10-09 ENCOUNTER — Ambulatory Visit: Payer: Commercial Managed Care - HMO | Admitting: Internal Medicine

## 2015-10-10 DIAGNOSIS — I25810 Atherosclerosis of coronary artery bypass graft(s) without angina pectoris: Secondary | ICD-10-CM | POA: Diagnosis not present

## 2015-10-10 DIAGNOSIS — Z9989 Dependence on other enabling machines and devices: Secondary | ICD-10-CM | POA: Diagnosis not present

## 2015-10-10 DIAGNOSIS — Q231 Congenital insufficiency of aortic valve: Secondary | ICD-10-CM | POA: Diagnosis not present

## 2015-10-10 DIAGNOSIS — E7800 Pure hypercholesterolemia, unspecified: Secondary | ICD-10-CM | POA: Diagnosis not present

## 2015-10-10 DIAGNOSIS — I480 Paroxysmal atrial fibrillation: Secondary | ICD-10-CM | POA: Diagnosis not present

## 2015-10-10 DIAGNOSIS — I10 Essential (primary) hypertension: Secondary | ICD-10-CM | POA: Diagnosis not present

## 2015-10-10 DIAGNOSIS — I350 Nonrheumatic aortic (valve) stenosis: Secondary | ICD-10-CM | POA: Diagnosis not present

## 2015-10-10 DIAGNOSIS — G4733 Obstructive sleep apnea (adult) (pediatric): Secondary | ICD-10-CM | POA: Diagnosis not present

## 2015-10-14 ENCOUNTER — Emergency Department: Payer: Commercial Managed Care - HMO

## 2015-10-14 ENCOUNTER — Emergency Department
Admission: EM | Admit: 2015-10-14 | Discharge: 2015-10-14 | Disposition: A | Discharge status: Home / Self Care | Payer: Commercial Managed Care - HMO | Attending: Emergency Medicine | Admitting: Emergency Medicine

## 2015-10-14 ENCOUNTER — Encounter: Payer: Self-pay | Admitting: Emergency Medicine

## 2015-10-14 DIAGNOSIS — I110 Hypertensive heart disease with heart failure: Secondary | ICD-10-CM | POA: Insufficient documentation

## 2015-10-14 DIAGNOSIS — I2510 Atherosclerotic heart disease of native coronary artery without angina pectoris: Secondary | ICD-10-CM | POA: Diagnosis not present

## 2015-10-14 DIAGNOSIS — E119 Type 2 diabetes mellitus without complications: Secondary | ICD-10-CM | POA: Insufficient documentation

## 2015-10-14 DIAGNOSIS — Z7982 Long term (current) use of aspirin: Secondary | ICD-10-CM | POA: Diagnosis not present

## 2015-10-14 DIAGNOSIS — R079 Chest pain, unspecified: Secondary | ICD-10-CM | POA: Insufficient documentation

## 2015-10-14 DIAGNOSIS — Z794 Long term (current) use of insulin: Secondary | ICD-10-CM | POA: Insufficient documentation

## 2015-10-14 DIAGNOSIS — R0789 Other chest pain: Secondary | ICD-10-CM | POA: Diagnosis not present

## 2015-10-14 DIAGNOSIS — I252 Old myocardial infarction: Secondary | ICD-10-CM | POA: Insufficient documentation

## 2015-10-14 DIAGNOSIS — I350 Nonrheumatic aortic (valve) stenosis: Secondary | ICD-10-CM | POA: Diagnosis not present

## 2015-10-14 DIAGNOSIS — I5022 Chronic systolic (congestive) heart failure: Secondary | ICD-10-CM | POA: Diagnosis not present

## 2015-10-14 DIAGNOSIS — Z79899 Other long term (current) drug therapy: Secondary | ICD-10-CM | POA: Insufficient documentation

## 2015-10-14 DIAGNOSIS — J449 Chronic obstructive pulmonary disease, unspecified: Secondary | ICD-10-CM | POA: Diagnosis not present

## 2015-10-14 DIAGNOSIS — I214 Non-ST elevation (NSTEMI) myocardial infarction: Secondary | ICD-10-CM

## 2015-10-14 DIAGNOSIS — R0602 Shortness of breath: Secondary | ICD-10-CM | POA: Diagnosis not present

## 2015-10-14 DIAGNOSIS — I482 Chronic atrial fibrillation: Secondary | ICD-10-CM | POA: Diagnosis not present

## 2015-10-14 LAB — CBC
HCT: 33.6 % — ABNORMAL LOW (ref 40.0–52.0)
HEMOGLOBIN: 10.7 g/dL — AB (ref 13.0–18.0)
MCH: 22.4 pg — ABNORMAL LOW (ref 26.0–34.0)
MCHC: 31.7 g/dL — ABNORMAL LOW (ref 32.0–36.0)
MCV: 70.6 fL — ABNORMAL LOW (ref 80.0–100.0)
PLATELETS: 184 10*3/uL (ref 150–440)
RBC: 4.76 MIL/uL (ref 4.40–5.90)
RDW: 17.9 % — ABNORMAL HIGH (ref 11.5–14.5)
WBC: 7.7 10*3/uL (ref 3.8–10.6)

## 2015-10-14 LAB — BASIC METABOLIC PANEL
ANION GAP: 10 (ref 5–15)
BUN: 16 mg/dL (ref 6–20)
CALCIUM: 9.5 mg/dL (ref 8.9–10.3)
CO2: 27 mmol/L (ref 22–32)
CREATININE: 0.93 mg/dL (ref 0.61–1.24)
Chloride: 103 mmol/L (ref 101–111)
GFR calc non Af Amer: >60 mL/min (ref >60)
Glucose, Bld: 107 mg/dL — ABNORMAL HIGH (ref 65–99)
Potassium: 3.9 mmol/L (ref 3.5–5.1)
SODIUM: 140 mmol/L (ref 135–145)

## 2015-10-14 LAB — PROTIME-INR
INR: 1.61
PROTHROMBIN TIME: 19.2 s — AB (ref 11.4–15.0)

## 2015-10-14 LAB — TROPONIN I
TROPONIN I: 0.05 ng/mL — AB (ref <0.031)
Troponin I: 0.04 ng/mL — ABNORMAL HIGH (ref <0.031)

## 2015-10-14 LAB — HEPARIN LEVEL (UNFRACTIONATED)

## 2015-10-14 LAB — APTT: APTT: 43 s — AB (ref 24–36)

## 2015-10-14 MED ORDER — ASPIRIN 81 MG PO CHEW
324.0000 mg | CHEWABLE_TABLET | Freq: Once | ORAL | Status: AC
  Administered 2015-10-14: 324 mg via ORAL
  Filled 2015-10-14: qty 4

## 2015-10-14 MED ORDER — HEPARIN (PORCINE) IN NACL 100-0.45 UNIT/ML-% IJ SOLN: 1200.0000 [IU]/h | INTRAMUSCULAR | Status: DC

## 2015-10-14 MED ORDER — HEPARIN BOLUS VIA INFUSION
4000.0000 [IU] | Freq: Once | INTRAVENOUS | Status: DC
  Filled 2015-10-14: qty 4000

## 2015-10-14 MED ORDER — HEPARIN (PORCINE) IN NACL 100-0.45 UNIT/ML-% IJ SOLN
1200.0000 [IU]/h | INTRAMUSCULAR | Status: DC
  Filled 2015-10-14: qty 250

## 2015-10-14 MED ORDER — MORPHINE SULFATE (PF) 4 MG/ML IV SOLN
4.0000 mg | Freq: Once | INTRAVENOUS | Status: AC
  Administered 2015-10-14: 4 mg via INTRAVENOUS
  Filled 2015-10-14: qty 1

## 2015-10-14 MED ORDER — NITROGLYCERIN 0.4 MG SL SUBL
0.4000 mg | SUBLINGUAL_TABLET | SUBLINGUAL | Status: AC | PRN
  Administered 2015-10-14 (×2): 0.4 mg via SUBLINGUAL
  Filled 2015-10-14 (×2): qty 1

## 2015-10-14 NOTE — ED Notes (Signed)
Discussed discharge instructions and follow-up care with patient. No questions or concerns at this time. Pt stable at discharge.  

## 2015-10-14 NOTE — Discharge Instructions (Signed)
You have been seen in the emergency department today for chest pain. Your workup has not shown any acute abnormality. As we discussed please follow-up with your cardiologist tomorrow, by calling the number provided. Return to the emergency department for any further/increased chest pain, trouble breathing, or any other symptom personally concerning to yourself.    Nonspecific Chest Pain It is often hard to find the cause of chest pain. There is always a chance that your pain could be related to something serious, such as a heart attack or a blood clot in your lungs. Chest pain can also be caused by conditions that are not life-threatening. If you have chest pain, it is very important to follow up with your doctor.  HOME CARE  If you were prescribed an antibiotic medicine, finish it all even if you start to feel better.  Avoid any activities that cause chest pain.  Do not use any tobacco products, including cigarettes, chewing tobacco, or electronic cigarettes. If you need help quitting, ask your doctor.  Do not drink alcohol.  Take medicines only as told by your doctor.  Keep all follow-up visits as told by your doctor. This is important. This includes any further testing if your chest pain does not go away.  Your doctor may tell you to keep your head raised (elevated) while you sleep.  Make lifestyle changes as told by your doctor. These may include:  Getting regular exercise. Ask your doctor to suggest some activities that are safe for you.  Eating a heart-healthy diet. Your doctor or a diet specialist (dietitian) can help you to learn healthy eating options.  Maintaining a healthy weight.  Managing diabetes, if necessary.  Reducing stress. GET HELP IF:  Your chest pain does not go away, even after treatment.  You have a rash with blisters on your chest.  You have a fever. GET HELP RIGHT AWAY IF:  Your chest pain is worse.  You have an increasing cough, or you cough up  blood.  You have severe belly (abdominal) pain.  You feel extremely weak.  You pass out (faint).  You have chills.  You have sudden, unexplained chest discomfort.  You have sudden, unexplained discomfort in your arms, back, neck, or jaw.  You have shortness of breath at any time.  You suddenly start to sweat, or your skin gets clammy.  You feel nauseous.  You vomit.  You suddenly feel light-headed or dizzy.  Your heart begins to beat quickly, or it feels like it is skipping beats. These symptoms may be an emergency. Do not wait to see if the symptoms will go away. Get medical help right away. Call your local emergency services (911 in the [place] Do not drive yourself to the hospital.   This information is not intended to replace advice given to you by your health care provider. Make sure you discuss any questions you have with your health care provider.   Document Released: 11/19/2007 Document Revised: 06/23/2014 Document Reviewed: 01/06/2014 Elsevier Interactive Patient Education [employer].

## 2015-10-14 NOTE — ED Provider Notes (Addendum)
[facility] Emergency Department Provider Note  Time seen: 12:26 PM  I have reviewed the triage vital signs and the nursing notes.   HISTORY  Chief Complaint Chest Pain    HPI Marcus Williams is a 60 y.o. male with a past medical history of MI, CAD, diabetes, hypertension, paroxysmal atrial fibrillation who presents to the emergency department with chest pain. According to the patient around 9:30 or 10:00 this morning he developed left-sided chest discomfort with radiation down his left arm. He also said he felt short of breath. Denies nausea or diaphoresis. Currently rates the pain as an 8/10 dull aching pressure sensation to the left chest. Has not taken aspirin today.    Past Medical History  Diagnosis Date  . MI (myocardial infarction) ([place])   . Sepsis ([place])   . History of cardioversion   . Diabetes mellitus without complication ([place])   . Hypertension   . CAD (coronary artery disease)   . SVT (supraventricular tachycardia) ([place])   . OSA on CPAP   . A-fib ([place])   . Chronic systolic CHF (congestive heart failure) [facility])     Patient Active Problem List   Diagnosis Date Noted  . Aortic valve, bicuspid 09/24/2015  . Cellulitis 05/04/2015  . Acute respiratory failure with hypoxia ([place]) 03/26/2015  . CHF (congestive heart failure) ([place]) 01/15/2015  . HTN (hypertension) 01/14/2015  . Type II diabetes mellitus ([place]) 01/14/2015  . COPD (chronic obstructive pulmonary disease) ([place]) 01/14/2015  . OSA on CPAP 01/14/2015  . CAD (coronary artery disease) 01/14/2015  . A-fib ([place]) 01/14/2015  . Pain of left calf 01/14/2015  . Acute on chronic systolic CHF (congestive heart failure) ([place]) 01/14/2015  . COPD exacerbation ([place]) 12/22/2014  . Diabetes ([place]) 10/14/2014    Past Surgical History  Procedure Laterality Date  . Quadruple bypass    . Knee surgery    . Bunionectomy    . Coronary artery bypass graft    . 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 Cardiac catheterization N/A 10/02/2015    Procedure: Coronary/Grafts Angiography;  Surgeon: Teodoro Spray, MD;  Location: [facility];  Service: Cardiovascular;  Laterality: N/A;    Current Outpatient Rx  Name  Route  Sig  Dispense  Refill  . acetaminophen (TYLENOL) 325 MG tablet   Oral   Take 2 tablets (650 mg total) by mouth every 6 (six) hours as needed for fever.         Marland Kitchen albuterol (PROVENTIL HFA;VENTOLIN HFA) 108 (90 Base) MCG/ACT inhaler   Inhalation   Inhale 2 puffs into the lungs every 6 (six) hours as needed for wheezing or shortness of breath.   1 Inhaler   10   . ALPRAZolam (XANAX) 0.25 MG tablet   Oral   Take 0.25 mg by mouth 2 (two) times daily as needed for anxiety.         Marland Kitchen aspirin EC 325 MG tablet   Oral   Take 325 mg by mouth daily.         . ciprofloxacin (CIPRO) 500 MG tablet   Oral   Take 500 mg by mouth 2 (two) times daily. Reported on 10/02/2015         . diltiazem (CARDIZEM CD) 300 MG 24 hr capsule   Oral   Take 1 capsule (300 mg total) by mouth  daily.   30 capsule   5   . fluticasone (FLONASE) 50 MCG/ACT nasal spray   Each Nare   Place 1 spray into both nostrils daily.         . Fluticasone-Salmeterol (ADVAIR DISKUS) 250-50 MCG/DOSE AEPB   Inhalation   Inhale 1 puff into the lungs 2 (two) times daily.         . furosemide (LASIX) 40 MG tablet   Oral   Take 1 tablet (40 mg total) by mouth 2 (two) times daily.   60 tablet   5   . glimepiride (AMARYL) 4 MG tablet   Oral   Take 4 mg by mouth daily.         . insulin NPH Human (HUMULIN N,NOVOLIN N) 100 UNIT/ML injection   Subcutaneous   Inject 40 Units into the skin 2 (two) times daily.         Marland Kitchen ipratropium-albuterol (DUONEB) 0.5-2.5 (3) MG/3ML SOLN   Nebulization   Take 3 mLs by nebulization every 4 (four) hours as needed (for shortness of  breath).         . metFORMIN (GLUCOPHAGE) 1000 MG tablet   Oral   Take 1,000 mg by mouth 2 (two) times daily with a meal.         . metoprolol tartrate (LOPRESSOR) 25 MG tablet   Oral   Take 1 tablet (25 mg total) by mouth 2 (two) times daily.   60 tablet   5   . nitroGLYCERIN (NITROSTAT) 0.4 MG SL tablet   Sublingual   Place 0.4 mg under the tongue every 5 (five) minutes as needed for chest pain.         Marland Kitchen oxyCODONE-acetaminophen (PERCOCET/ROXICET) 5-325 MG tablet   Oral   Take 1-2 tablets by mouth every 6 (six) hours as needed for moderate pain or severe pain.   5 tablet   0   . pantoprazole (PROTONIX) 40 MG tablet   Oral   Take 40 mg by mouth daily.         . pramipexole (MIRAPEX) 0.5 MG tablet   Oral   Take 0.5 mg by mouth at bedtime.         . pregabalin (LYRICA) 75 MG capsule   Oral   Take 75 mg by mouth 2 (two) times daily.         . simvastatin (ZOCOR) 40 MG tablet   Oral   Take 40 mg by mouth at bedtime.          . traZODone (DESYREL) 100 MG tablet   Oral   Take 100 mg by mouth at bedtime.           Allergies Review of patient's allergies indicates no known allergies.  Family History  Problem Relation Age of Onset  . Stroke Mother   . Heart attack Mother   . Hypertension Mother   . Heart attack Father   . Hypertension Father   . Heart attack Brother     #1  . Diabetes Brother     #1  . Heart disease Brother     #2  . Lung disease Brother     #2  . Hypertension Brother     #2  . Diabetes Brother     #2    Social History Social History  Substance Use Topics  . Smoking status: Never Smoker   . Smokeless tobacco: None  . Alcohol Use: No    Review of Systems  Constitutional: Negative for fever. Cardiovascular: Positive for chest pain Respiratory: Positive for shortness of breath Gastrointestinal: Negative for abdominal pain, vomiting and diarrhea. Musculoskeletal: Negative for back pain. Neurological: Negative for  headache 10-point ROS otherwise negative.  ____________________________________________   PHYSICAL EXAM:  VITAL SIGNS: ED Triage Vitals  Enc Vitals Group     BP 10/14/15 1129 151/65 mmHg     Pulse Rate 10/14/15 1129 56     Resp 10/14/15 1129 18     Temp 10/14/15 1129 97.9 F (36.6 C)     Temp Source 10/14/15 1129 Oral     SpO2 10/14/15 1129 97 %     Weight 10/14/15 1129 265 lb (120.203 kg)     Height 10/14/15 1129 5\' 7"  (1.702 m)     Head Cir --      Peak Flow --      Pain Score 10/14/15 1132 8     Pain Loc --      Pain Edu? --      Excl. in [place]? --     Constitutional: Alert and oriented. Well appearing and in no distress. Eyes: Normal exam ENT   Head: Normocephalic and atraumatic.   Mouth/Throat: Mucous membranes are moist. Cardiovascular: Normal rate, regular rhythm. 2/6 systolic ejection murmur Respiratory: Normal respiratory effort without tachypnea nor retractions. Breath sounds are clear  Gastrointestinal: Soft and nontender. No distention. Musculoskeletal: Nontender with normal range of motion in all extremities. Neurologic:  Normal speech and language. No gross focal neurologic deficits Skin:  Skin is warm, dry and intact.  Psychiatric: Mood and affect are normal.   ____________________________________________    EKG  EKG reviewed and interpreted by myself shows normal sinus rhythm at 60 bpm, narrow QRS, normal axis, largely normal intervals. Patient does have T-wave inversions in the lateral leads, which appeared new compared to 05/16/15.  ____________________________________________    RADIOLOGY  Chest x-ray does not appear to show any acute abnormality on my read.  ____________________________________________   INITIAL IMPRESSION / ASSESSMENT AND PLAN / ED COURSE  Pertinent labs & imaging results that were available during my care of the patient were reviewed by me and considered in my medical decision making (see chart for  details).  Patient presents emergency department with chest pain. Currently ranks his chest pain as an 8/10 located in the left chest with some radiation down the left arm. Associated with shortness of breath. Denies any nausea or diaphoresis. Patient's labs have resulted with a positive troponin of 0.05. Patient's symptoms started approximately 1 hour prior to lab draw. Patient's EKG appears to show new T-wave inversions in the lateral leads. Given the patient his chest discomfort, mildly positive troponin with new T-wave changes we will place the patient on a heparin drip and admitted to the hospital for likely unstable angina versus NSTEMI.  Pharmacy recommends holding the heparin drip for 24 hours as the patient took his full dose of xarelto this morning.  Discussed with Dr.Paraschos, patient had a cardiac catheterization approximately 2 weeks ago. Showed 100% stenosis of 3 vessels however his graft appeared patent. However this is 2 weeks ago and his troponin is still positive, along with 8/10 pain in his left chest with radiation to the left arm, and new EKG changes compared to December we will admit to the hospitalist for further treatment.  ----------------------------------------- 3:43 PM on 10/14/2015 -----------------------------------------  Repeat troponin is declined. Patient states his pain is much better than it was when he came in is still occasionally  has a mild discomfort to her left chest. I discussed with the hospitalist, they were able to talk to the patient's primary care physician Dr. Doy Hutching who states the patient has a history of chronic angina, with a recent cardiac catheterization showing a patent CABG graft and he is already on Xarelto they do not feel there is much we can offer from a hospital admission at this point. The hospitalist will be doing a consult for the patient to make sure there is nothing additionally we can offer him at this point, patient will likely be  discharged home with follow-up with cardiology tomorrow with Dr. Ubaldo Glassing.   ____________________________________________   FINAL CLINICAL IMPRESSION(S) / ED DIAGNOSES   Chest pain  Harvest Dark, MD 10/14/15 [place], MD 10/14/15 [place], MD 10/14/15 1544

## 2015-10-14 NOTE — ED Notes (Signed)
Pt presents with left sided chest pain and shortness of breath. Pt states has a history of MI.

## 2015-10-14 NOTE — Progress Notes (Signed)
ANTICOAGULATION CONSULT NOTE - Initial Consult  Pharmacy Consult for Heparin Drip Indication: chest pain/ACS  No Known Allergies  Patient Measurements: Height: 5\' 7"  (170.2 cm) Weight: 265 lb (120.203 kg) IBW/kg (Calculated) : 66.1 Heparin Dosing Weight: 93.9 kg  Vital Signs: Temp: 97.9 F (36.6 C) (04/30 1129) Temp Source: Oral (04/30 1129) BP: 147/67 mmHg (04/30 1400) Pulse Rate: 66 (04/30 1400)  Labs:  Recent Labs  10/14/15 1136 10/14/15 1425  HGB 10.7*  --   HCT 33.6*  --   PLT 184  --   APTT 43*  --   LABPROT 19.2*  --   INR 1.61  --   HEPARINUNFRC >3.60*  --   CREATININE 0.93  --   TROPONINI 0.05* 0.04*    Estimated Creatinine Clearance: 104.8 mL/min (by C-G formula based on Cr of 0.93).   Medical History: Past Medical History  Diagnosis Date  . MI (myocardial infarction) ([place])   . Sepsis ([place])   . History of cardioversion   . Diabetes mellitus without complication ([place])   . Hypertension   . CAD (coronary artery disease)   . SVT (supraventricular tachycardia) ([place])   . OSA on CPAP   . A-fib ([place])   . Chronic systolic CHF (congestive heart failure) ([facility])     Medications:  Scheduled:   Infusions:  . [START ON 10/15/2015] heparin      Assessment: Pharmacy consulted to initiate and titrate heparin drip in a 60 yo male with ACS/NSTEMI. Spoke with RN as patient has a history of afib and on Xarelto per Care Everywhere notes.  Per patient, he took a blood thinner this AM and not sure what the name is. Per pharmacy, patient is prescribed Xarelto 15 mg po daily.   HL>3.6, aPTT: 43, INR: 1.61  Goal of Therapy:  APTT: 66-102 s Heparin level 0.3-0.7 units/ml Monitor platelets by anticoagulation protocol: Yes   Plan:  Will start heparin drip at rate of 1200 units/hr at 0500 on 5/1.  Patient is taking a lower than recommended dose of Xarelto based on renal function.  Will start heparin drip a little earlier then 24 hours and check a aPTT and HL 6 hours  after scheduled start time for drip.  If HL remains elevated and dose no correlate with aPTT then will need to use aPTT monitoring.  CBC ordered in AM.   Neriyah Cercone G 10/14/2015,3:14 PM

## 2015-10-14 NOTE — Consult Note (Signed)
Patient Demographics  Marcus Williams, is a 60 y.o. male   MRN: [id number]   DOB - [birth year]  Admit Date - 10/14/2015    Outpatient Primary MD for the patient is SPARKS,JEFFREY D, MD  Consult requested in the Hospital by Harvest Dark, MD, On 10/14/2015    Reason for consult; chest pain chief complaint chest pain HPI  Marcus Williams  is a 60 y.o. male, with history of moderate aortic stenosis by echo recently, recent cardiac catheterization showing patent bypass grafts comes in with chest pain on the left side of the chest going to the arm. No aggravating factors no relieving factors. Patient did not get relief with nitroglycerin given in the emergency room. No shortness of breath. Patient recently was seen by Dr. Ubaldo Glassing and had an angiogram done 2 weeks ago, he was told that he needed aortic valve repair and he'll be referred to [facility] cardiology. And had a troponin level of 0.05 on the first 1, the second troponin is 0.04 only. And patient is advised that she he can follow up with Dr. Ubaldo Glassing tomorrow and refer him to do cardiology. And patient is eager to go home. EKG shows normal sinus rhythm 60 bpm has T-wave inversion in V1 and aVL but we don't have any EKGs since December of last year so we are not sure if these are changes that are developing between as well. Patient has been having chest pain and exertional dyspnea for a while for which he had a cardiac catheterization done.       Past Medical History  Diagnosis Date  . MI (myocardial infarction) ([place])   . Sepsis ([place])   . History of cardioversion   . Diabetes mellitus without complication ([place])   . Hypertension   . CAD (coronary artery disease)   . SVT (supraventricular tachycardia) ([place])   . OSA on CPAP   . A-fib ([place])   . Chronic systolic CHF (congestive heart failure)  [facility])       Past Surgical History  Procedure Laterality Date  . Quadruple bypass    . Knee surgery    . Bunionectomy    . Coronary artery bypass graft    . 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 Cardiac catheterization N/A 10/02/2015    Procedure: Coronary/Grafts Angiography;  Surgeon: Teodoro Spray, MD;  Location: [place] [facility];  Service: Cardiovascular;  Laterality: N/A;    in for   Chief Complaint  Patient presents with  . Chest Pain     HPI  Marcus Williams  is a 60 y.o. male,     Review of Systems    In addition to the HPI above, No Fever-chills, No Headache, No changes with Vision or hearing, No problems swallowing food or Liquids, No Chest pain, Cough or Shortness of Breath, No Abdominal pain, No Nausea or Vommitting, Bowel movements are regular, No  Blood in stool or Urine, No dysuria, No new skin rashes or bruises, No new joints pains-aches,  No new weakness, tingling, numbness in any extremity, No recent weight gain or loss, No polyuria, polydypsia or polyphagia, No significant Mental Stressors.  A full 10 point Review of Systems was done, except as stated above, all other Review of Systems were negative.   Social History Social History  Substance Use Topics  . Smoking status: Never Smoker   . Smokeless tobacco: Not on file  . Alcohol Use: No     Family History Family History  Problem Relation Age of Onset  . Stroke Mother   . Heart attack Mother   . Hypertension Mother   . Heart attack Father   . Hypertension Father   . Heart attack Brother     #1  . Diabetes Brother     #1  . Heart disease Brother     #2  . Lung disease Brother     #2  . Hypertension Brother     #2  . Diabetes Brother     #2     Prior to Admission medications   Medication Sig Start Date End Date Taking? Authorizing Provider  albuterol (PROVENTIL  HFA;VENTOLIN HFA) 108 (90 Base) MCG/ACT inhaler Inhale 2 puffs into the lungs every 6 (six) hours as needed for wheezing or shortness of breath. 07/11/15  Yes Flora Lipps, MD  ALPRAZolam (XANAX) 0.25 MG tablet Take 0.25 mg by mouth 2 (two) times daily as needed for anxiety.   Yes Historical Provider, MD  aspirin EC 325 MG tablet Take 325 mg by mouth at bedtime.    Yes Historical Provider, MD  diltiazem (CARDIZEM CD) 300 MG 24 hr capsule Take 1 capsule (300 mg total) by mouth daily. 10/15/14  Yes Idelle Crouch, MD  fluticasone (FLONASE) 50 MCG/ACT nasal spray Place 1 spray into both nostrils daily.   Yes Historical Provider, MD  Fluticasone-Salmeterol (ADVAIR DISKUS) 250-50 MCG/DOSE AEPB Inhale 1 puff into the lungs 2 (two) times daily.   Yes Historical Provider, MD  furosemide (LASIX) 40 MG tablet Take 1 tablet (40 mg total) by mouth 2 (two) times daily. 10/15/14  Yes Idelle Crouch, MD  glimepiride (AMARYL) 4 MG tablet Take 4 mg by mouth daily.   Yes Historical Provider, MD  insulin NPH Human (HUMULIN N,NOVOLIN N) 100 UNIT/ML injection Inject 40 Units into the skin 2 (two) times daily.   Yes Historical Provider, MD  ipratropium-albuterol (DUONEB) 0.5-2.5 (3) MG/3ML SOLN Take 3 mLs by nebulization every 4 (four) hours as needed (for shortness of breath).   Yes Historical Provider, MD  metFORMIN (GLUCOPHAGE) 1000 MG tablet Take 1,000 mg by mouth 2 (two) times daily with a meal.   Yes Historical Provider, MD  metoprolol tartrate (LOPRESSOR) 25 MG tablet Take 1 tablet (25 mg total) by mouth 2 (two) times daily. 10/15/14  Yes Idelle Crouch, MD  nitroGLYCERIN (NITROSTAT) 0.4 MG SL tablet Place 0.4 mg under the tongue every 5 (five) minutes as needed for chest pain.   Yes Historical Provider, MD  pantoprazole (PROTONIX) 40 MG tablet Take 40 mg by mouth daily.   Yes Historical Provider, MD  pramipexole (MIRAPEX) 0.5 MG tablet Take 0.5 mg by mouth at bedtime.   Yes Historical Provider, MD  Rivaroxaban  (XARELTO) 15 MG TABS tablet Take 15 mg by mouth daily with breakfast.   Yes Historical Provider, MD  simvastatin (ZOCOR) 40 MG tablet Take 40 mg by  mouth at bedtime.    Yes Historical Provider, MD  traZODone (DESYREL) 100 MG tablet Take 100 mg by mouth at bedtime.   Yes Historical Provider, MD  acetaminophen (TYLENOL) 325 MG tablet Take 2 tablets (650 mg total) by mouth every 6 (six) hours as needed for fever. 05/08/15   [name], MD  oxyCODONE-acetaminophen (PERCOCET/ROXICET) 5-325 MG tablet Take 1-2 tablets by mouth every 6 (six) hours as needed for moderate pain or severe pain. 05/08/15   [name], MD    Anti-infectives    None      Scheduled Meds:  Continuous Infusions: . [START ON 10/15/2015] heparin     PRN Meds:.  No Known Allergies  Physical Exam  Vitals  Blood pressure 150/70, pulse 58, temperature 97.9 F (36.6 C), temperature source Oral, resp. rate 12, height 5\' 7"  (1.702 m), weight 120.203 kg (265 lb), SpO2 98 %.   1. General *Alert,awake oriented.  2. Normal affect and insight, Not Suicidal or Homicidal,   3. No F.N deficits, ALL C.Nerves Intact, Strength 5/5 all 4 extremities, Sensation intact all 4 extremities, Plantars down going.  4. Ears and Eyes appear Normal, Conjunctivae clear, PERRLA. Moist Oral Mucosa.  5. Supple Neck, No JVD, No cervical lymphadenopathy appriciated, No Carotid Bruits.  6. Symmetrical Chest wall movement, Good air movement bilaterally, CTAB.  7. RRR, No Gallops, Rubs or Murmurs, No Parasternal Heave.  8. Positive Bowel Sounds, Abdomen Soft, No tenderness, No organomegaly appriciated,No rebound -guarding or rigidity.  9.  No Cyanosis, Normal Skin Turgor, No Skin Rash or Bruise.  10. Good muscle tone,  joints appear normal , no effusions, Normal ROM.  11. No Palpable Lymph Nodes in Neck or Axillae  Data Review  CBC  Recent Labs Lab 10/14/15 1136  WBC 7.7  HGB 10.7*  HCT 33.6*  PLT 184  MCV 70.6*  MCH 22.4*  MCHC  31.7*  RDW 17.9*   ------------------------------------------------------------------------------------------------------------------  Chemistries   Recent Labs Lab 10/14/15 1136  NA 140  K 3.9  CL 103  CO2 27  GLUCOSE 107*  BUN 16  CREATININE 0.93  CALCIUM 9.5   ------------------------------------------------------------------------------------------------------------------ estimated creatinine clearance is 104.8 mL/min (by C-G formula based on Cr of 0.93). ------------------------------------------------------------------------------------------------------------------ No results for input(s): TSH, T4TOTAL, T3FREE, THYROIDAB in the last 72 hours.  Invalid input(s): FREET3   Coagulation profile  Recent Labs Lab 10/14/15 1136  INR 1.61   ------------------------------------------------------------------------------------------------------------------- No results for input(s): DDIMER in the last 72 hours. -------------------------------------------------------------------------------------------------------------------  Cardiac Enzymes  Recent Labs Lab 10/14/15 1136 10/14/15 1425  TROPONINI 0.05* 0.04*   ------------------------------------------------------------------------------------------------------------------ Invalid input(s): POCBNP   ---------------------------------------------------------------------------------------------------------------  Urinalysis    Component Value Date/Time   COLORURINE YELLOW* 05/04/2015 1653   COLORURINE Yellow 07/21/2014 1803   APPEARANCEUR CLEAR* 05/04/2015 1653   APPEARANCEUR Clear 07/21/2014 1803   LABSPEC 1.017 05/04/2015 1653   LABSPEC 1.011 07/21/2014 1803   PHURINE 5.0 05/04/2015 1653   PHURINE 7.0 07/21/2014 1803   GLUCOSEU NEGATIVE 05/04/2015 1653   GLUCOSEU >=500 07/21/2014 1803   HGBUR 1+* 05/04/2015 1653   HGBUR 1+ 07/21/2014 1803   BILIRUBINUR NEGATIVE 05/04/2015 1653   BILIRUBINUR Negative  07/21/2014 [place] 05/04/2015 1653   KETONESUR 1+ 07/21/2014 1803   PROTEINUR 30* 05/04/2015 1653   PROTEINUR 30 mg/dL 07/21/2014 1803   UROBILINOGEN 0.2 07/28/2014 1449   NITRITE NEGATIVE 05/04/2015 1653   NITRITE Negative 07/21/2014 1803   LEUKOCYTESUR NEGATIVE 05/04/2015 1653   LEUKOCYTESUR Negative 07/21/2014 1803  Imaging results:   Dg Chest Portable 1 View  10/14/2015  CLINICAL DATA:  Left-sided chest pain and shortness of breath EXAM: PORTABLE CHEST 1 VIEW COMPARISON:  CT chest 09/19/2015 FINDINGS: There is no focal parenchymal opacity. There is no pleural effusion or pneumothorax. The heart and mediastinal contours are unremarkable. There is evidence of prior CABG. The osseous structures are unremarkable. IMPRESSION: No active disease. Electronically Signed   By: Kathreen Devoid   On: 10/14/2015 13:06    Assessment & Plan  Active Problems:   * No active hospital problems. *    1. . 60 year old male patient with chest pain due to aortic stenosis, EKG changes are also likely due to aortic  stenosis, the EKG changes that we see may be present in between from December 2016,April so we don't know whether new or old. Had he patient troponins are downtrending indicating patient  Does not need to stay in hospital,Dr.sparks ,PCP is  Aware . She has nitroglycerin at home, advised him to continue that. Chest pain may be coming from aortic stenosis. Patient can see Dr. fat tomorrow for referring him to do cardiology. #3. Chronic atrial fibrillation: Patient is on Xarelto. Rate is controlled. Continue Cardizem., Metoprolol. #4 history of COPD continue Flonase, Advair Diskus.  For diabetes mellitus type 2: Patient takes Novolin N, metformin.   TTime spent 55 minutes, reviewed the records in Epic also.  Patient will be discharged by emergency room. Physician. Marland Kitchen oKONIDENA,Patric Buckhalter M.D on 10/14/2015 at 3:58 PM  Note: This dictation was prepared with Dragon dictation  along with smaller phrase technology. Any transcriptional errors that result from this process are unintentional.

## 2015-10-15 DIAGNOSIS — G8929 Other chronic pain: Secondary | ICD-10-CM | POA: Diagnosis not present

## 2015-10-15 DIAGNOSIS — G4733 Obstructive sleep apnea (adult) (pediatric): Secondary | ICD-10-CM | POA: Diagnosis not present

## 2015-10-15 DIAGNOSIS — R0789 Other chest pain: Secondary | ICD-10-CM | POA: Diagnosis not present

## 2015-10-15 DIAGNOSIS — I10 Essential (primary) hypertension: Secondary | ICD-10-CM | POA: Diagnosis not present

## 2015-10-15 DIAGNOSIS — I480 Paroxysmal atrial fibrillation: Secondary | ICD-10-CM | POA: Diagnosis not present

## 2015-10-15 DIAGNOSIS — Q231 Congenital insufficiency of aortic valve: Secondary | ICD-10-CM | POA: Diagnosis not present

## 2015-10-15 DIAGNOSIS — E7800 Pure hypercholesterolemia, unspecified: Secondary | ICD-10-CM | POA: Diagnosis not present

## 2015-10-15 DIAGNOSIS — Z9989 Dependence on other enabling machines and devices: Secondary | ICD-10-CM | POA: Diagnosis not present

## 2015-10-15 DIAGNOSIS — I25810 Atherosclerosis of coronary artery bypass graft(s) without angina pectoris: Secondary | ICD-10-CM | POA: Diagnosis not present

## 2015-10-24 DIAGNOSIS — R0602 Shortness of breath: Secondary | ICD-10-CM | POA: Diagnosis not present

## 2015-10-24 DIAGNOSIS — Q231 Congenital insufficiency of aortic valve: Secondary | ICD-10-CM | POA: Diagnosis not present

## 2015-10-24 DIAGNOSIS — F411 Generalized anxiety disorder: Secondary | ICD-10-CM | POA: Diagnosis not present

## 2015-10-25 DIAGNOSIS — B351 Tinea unguium: Secondary | ICD-10-CM | POA: Diagnosis not present

## 2015-10-25 DIAGNOSIS — Z794 Long term (current) use of insulin: Secondary | ICD-10-CM | POA: Diagnosis not present

## 2015-10-25 DIAGNOSIS — E1140 Type 2 diabetes mellitus with diabetic neuropathy, unspecified: Secondary | ICD-10-CM | POA: Diagnosis not present

## 2015-10-26 IMAGING — US US EXTREM LOW VENOUS*L*
1 series · 13 of 24 positions shown · non-contrast
Comparison: 04/21/2012

CLINICAL DATA: Left calf pain for 12 hours.



[Series 1: us extrem low venous*left* · 0.09mm/px · 13 of 37 slices shown]
[im 1/37]
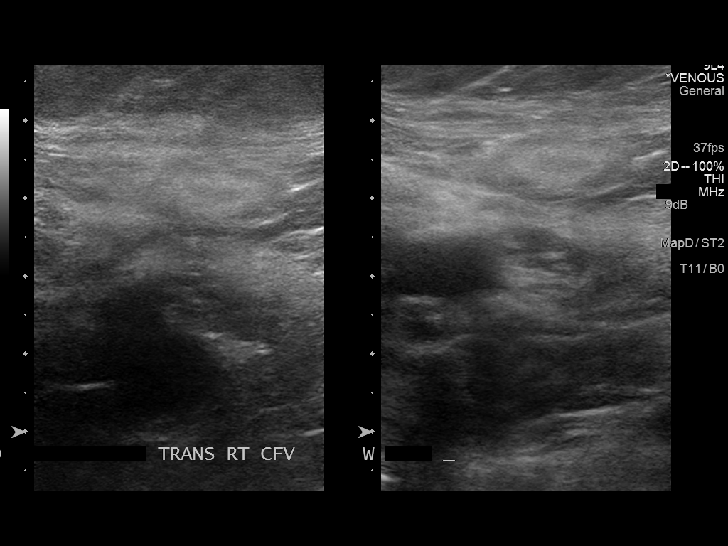
[im 4/37]
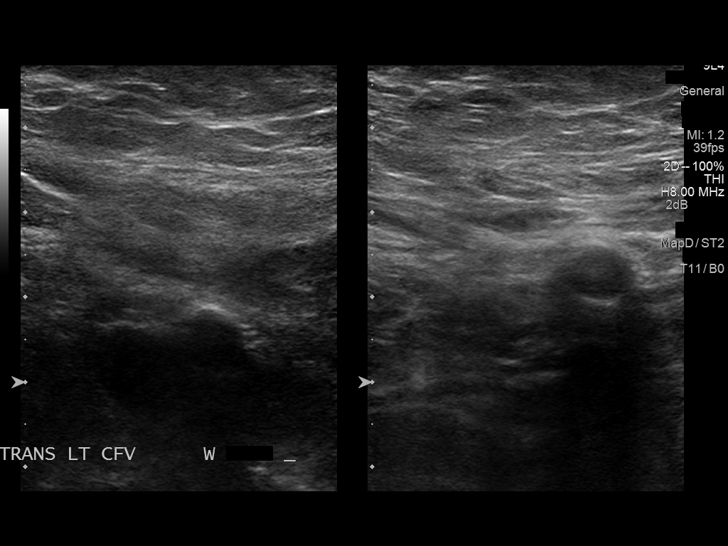
[im 7/37]
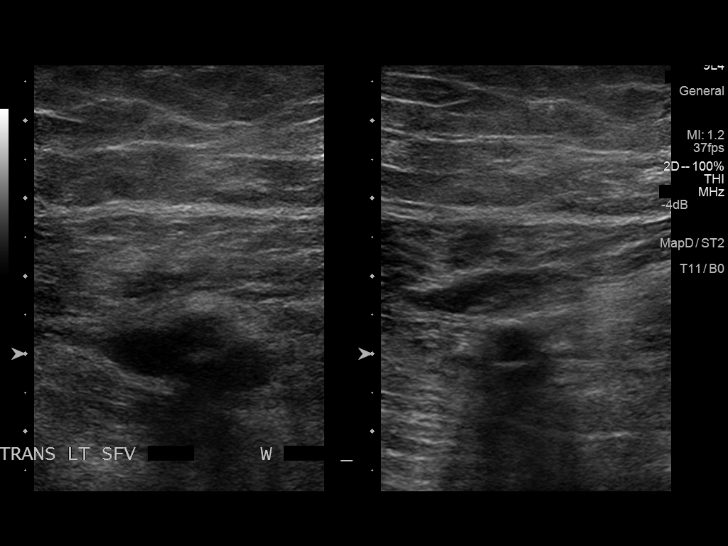
[im 10/37]
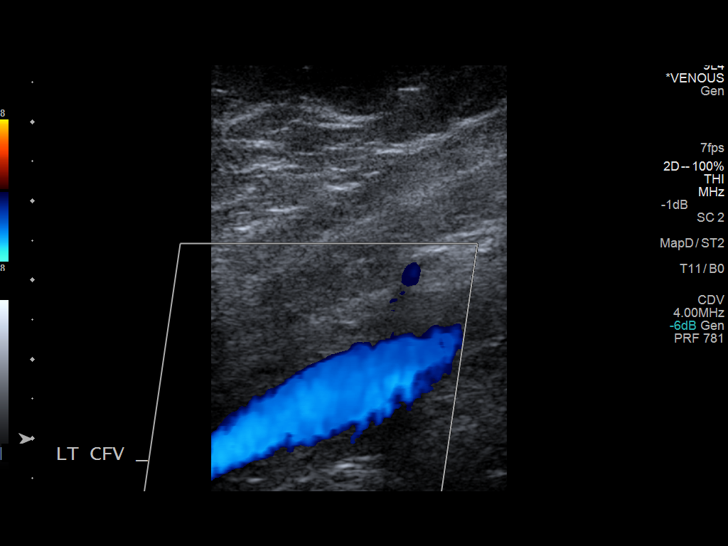
[im 13/37]
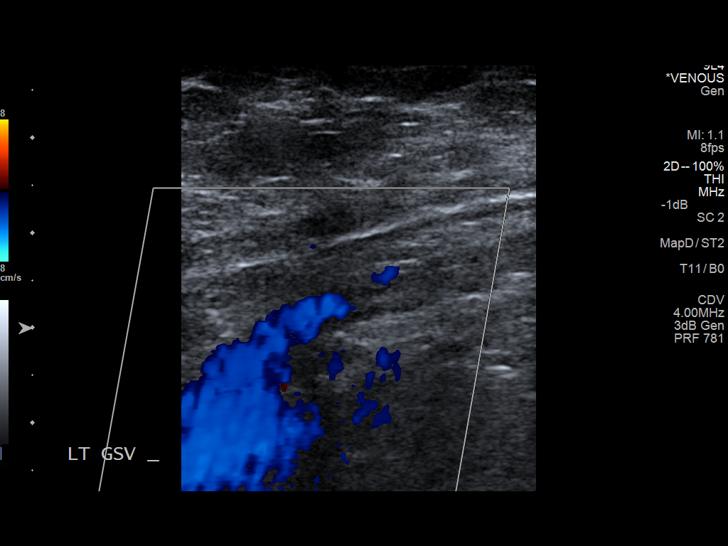
[im 16/37]
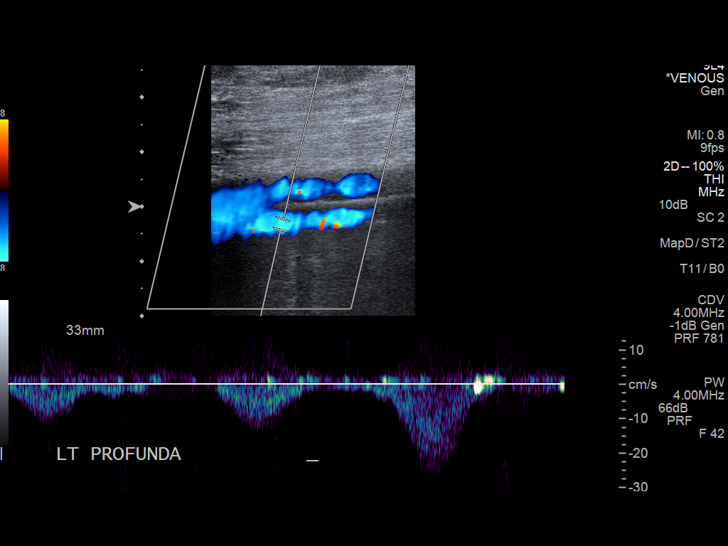
[im 19/37]
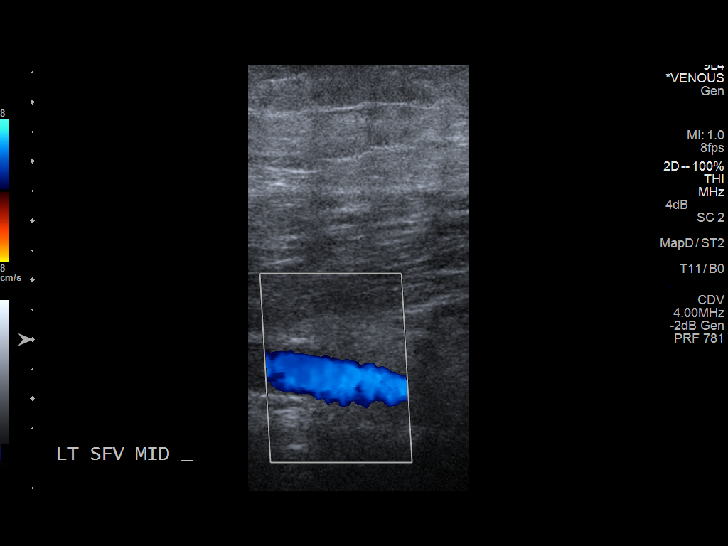
[im 21/37]
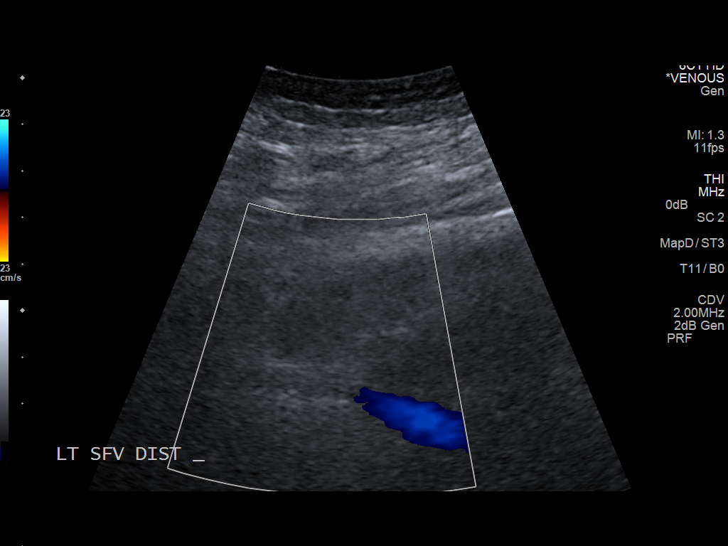
[im 24/37]
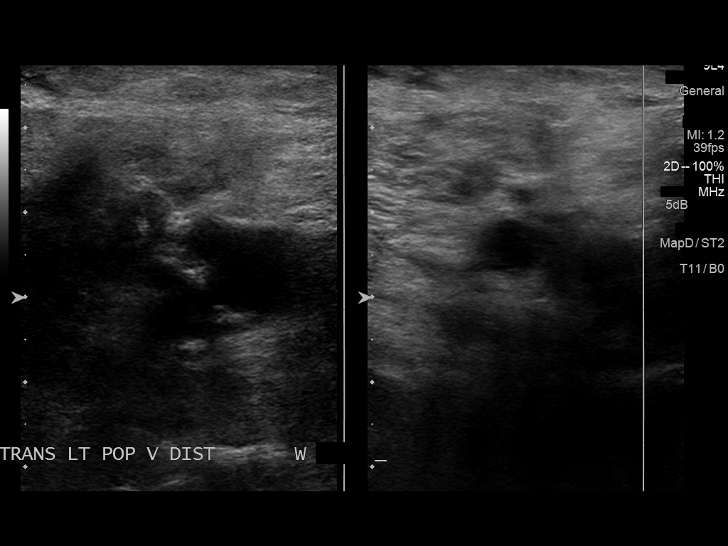
[im 27/37]
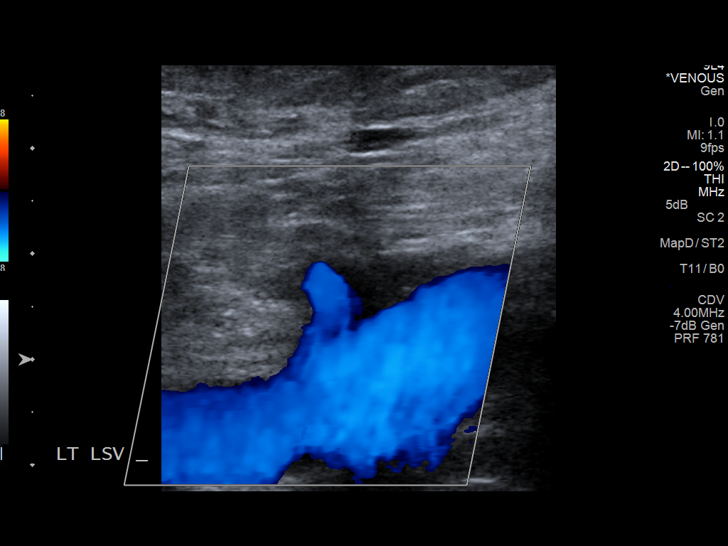
[im 30/37]
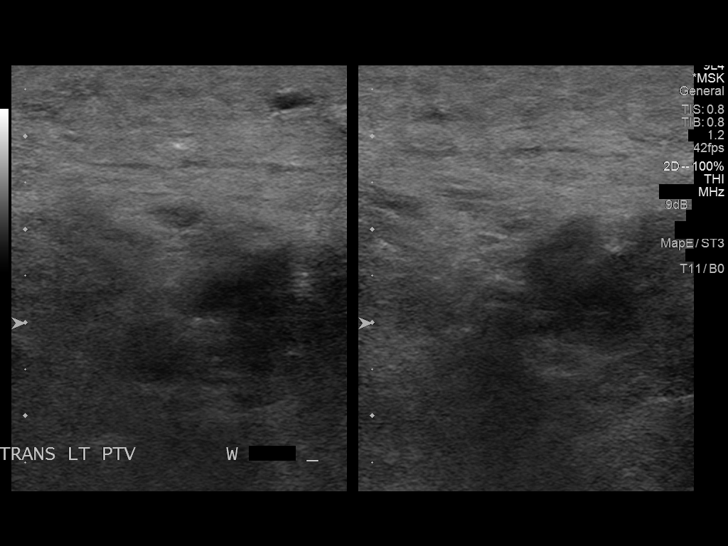
[im 33/37]
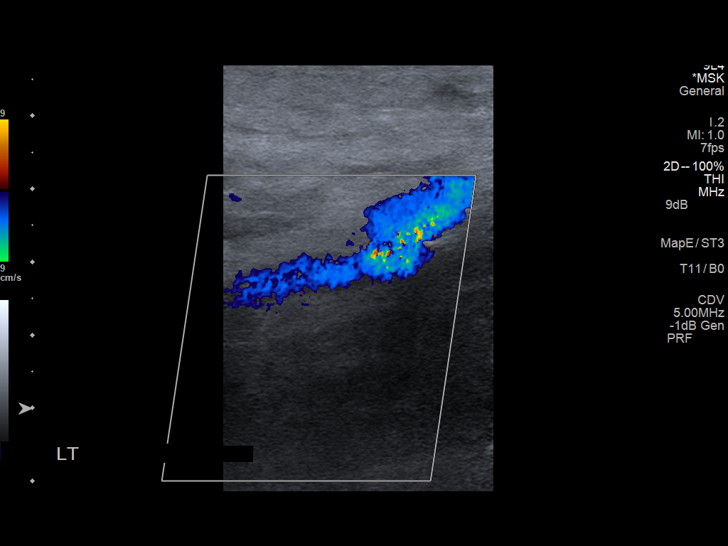
[im 37/37]
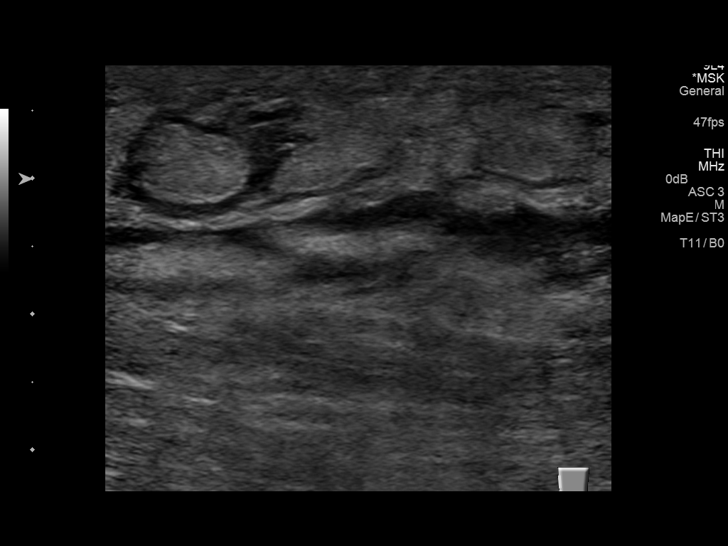

[13 of 24 positions shown; findings below may reference images not displayed]

FINDINGS: Contralateral Common Femoral Vein: Respiratory phasicity is normal
and symmetric with the symptomatic side. No evidence of thrombus.
Normal compressibility.

Common Femoral Vein: No evidence of thrombus. Normal
compressibility, respiratory phasicity and response to augmentation.

Saphenofemoral Junction: No evidence of thrombus. Normal
compressibility and flow on color Doppler imaging.

Profunda Femoral Vein: No evidence of thrombus. Normal
compressibility and flow on color Doppler imaging.

Femoral Vein: No evidence of thrombus. Normal compressibility,
respiratory phasicity and response to augmentation.

Popliteal Vein: No evidence of thrombus. Normal compressibility,
respiratory phasicity and response to augmentation.

Calf Veins: No evidence of thrombus. Normal compressibility and flow
on color Doppler imaging.

Superficial Great Saphenous Vein: No evidence of thrombus. Normal
compressibility and flow on color Doppler imaging.

Venous Reflux:  None.

Other Findings:  None.
IMPRESSION: No evidence of deep venous thrombosis.

## 2015-10-29 DIAGNOSIS — G4733 Obstructive sleep apnea (adult) (pediatric): Secondary | ICD-10-CM | POA: Diagnosis not present

## 2015-10-29 DIAGNOSIS — J4540 Moderate persistent asthma, uncomplicated: Secondary | ICD-10-CM | POA: Diagnosis not present

## 2015-10-29 DIAGNOSIS — I509 Heart failure, unspecified: Secondary | ICD-10-CM | POA: Diagnosis not present

## 2015-10-29 DIAGNOSIS — Z9989 Dependence on other enabling machines and devices: Secondary | ICD-10-CM | POA: Diagnosis not present

## 2015-10-29 DIAGNOSIS — I25810 Atherosclerosis of coronary artery bypass graft(s) without angina pectoris: Secondary | ICD-10-CM | POA: Diagnosis not present

## 2015-10-29 DIAGNOSIS — R0602 Shortness of breath: Secondary | ICD-10-CM | POA: Diagnosis not present

## 2015-10-29 DIAGNOSIS — E1143 Type 2 diabetes mellitus with diabetic autonomic (poly)neuropathy: Secondary | ICD-10-CM | POA: Diagnosis not present

## 2015-10-30 DIAGNOSIS — I25810 Atherosclerosis of coronary artery bypass graft(s) without angina pectoris: Secondary | ICD-10-CM | POA: Diagnosis not present

## 2015-10-30 DIAGNOSIS — I509 Heart failure, unspecified: Secondary | ICD-10-CM | POA: Diagnosis not present

## 2015-10-30 DIAGNOSIS — R0602 Shortness of breath: Secondary | ICD-10-CM | POA: Diagnosis not present

## 2015-10-30 DIAGNOSIS — J4540 Moderate persistent asthma, uncomplicated: Secondary | ICD-10-CM | POA: Diagnosis not present

## 2015-11-06 IMAGING — CT CT ABD-PELV W/O CM
2 of 4 series · 16 of 46 positions shown, 18 images · non-contrast
Comparison: 09/14/2012

CLINICAL DATA: Left flank pain, nausea, vomiting

EXAM:
CT ABDOMEN AND PELVIS WITHOUT CONTRAST
TECHNIQUE: Multidetector CT imaging of the abdomen and pelvis was performed
following the standard protocol without IV contrast.

[Series 2: stone standard full · axial · 0.89mm/px · z∈[-896,-460]mm · 13 of 95 slices shown, 15 images]
[im 4/95  soft-tissue]
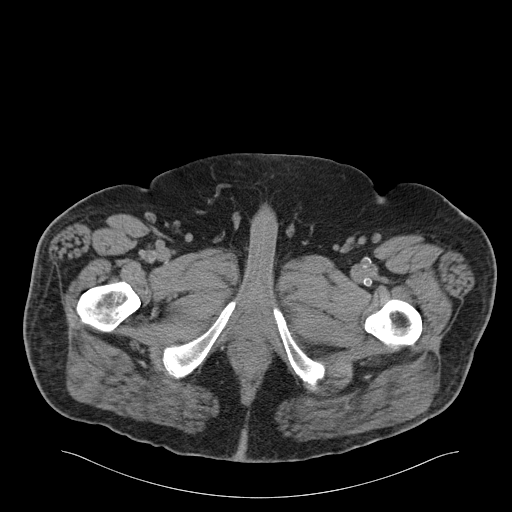
[im 4/95  bone]
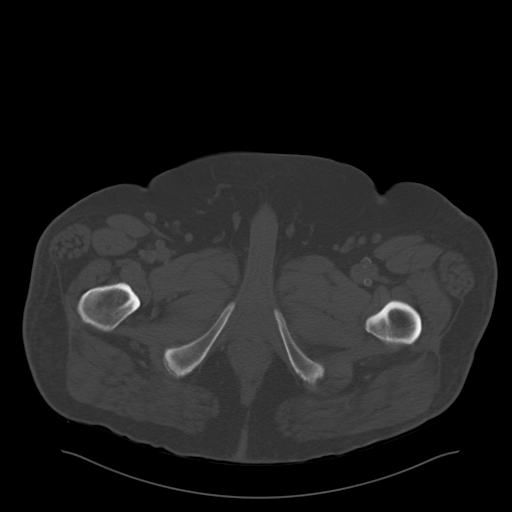
[im 12/95  soft-tissue]
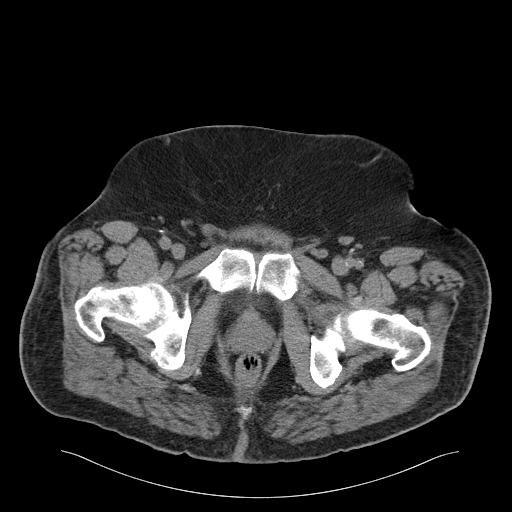
[im 19/95  soft-tissue]
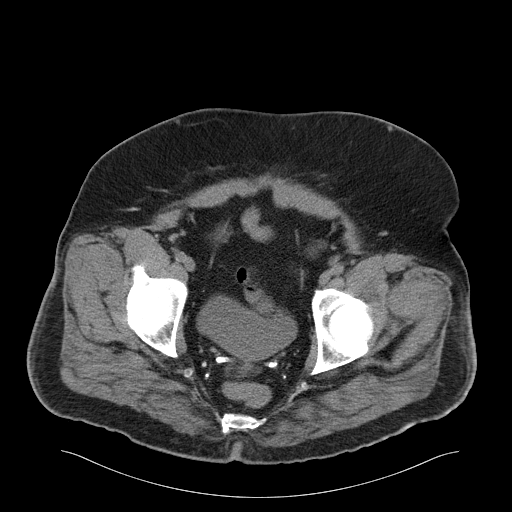
[im 27/95  soft-tissue]
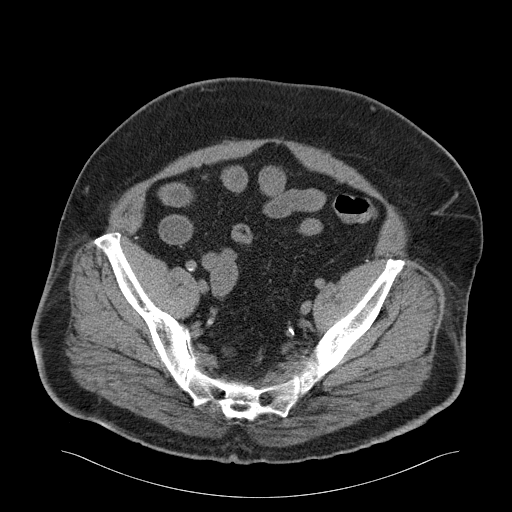
[im 34/95  soft-tissue]
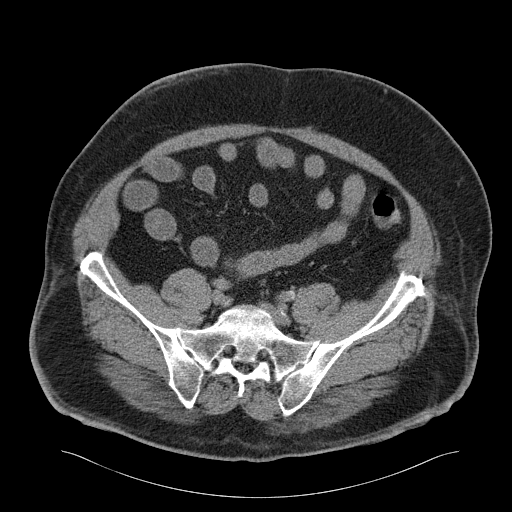
[im 42/95  soft-tissue]
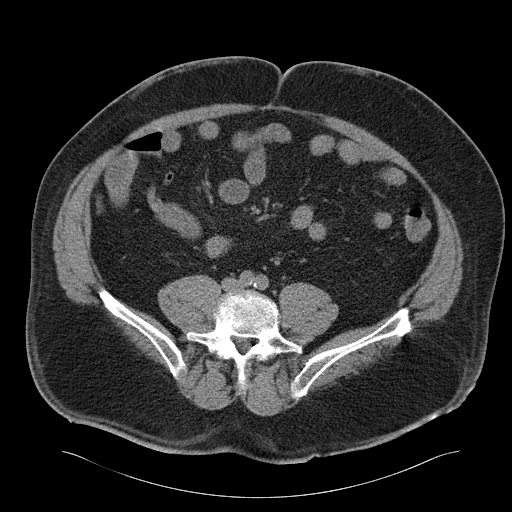
[im 49/95  soft-tissue]
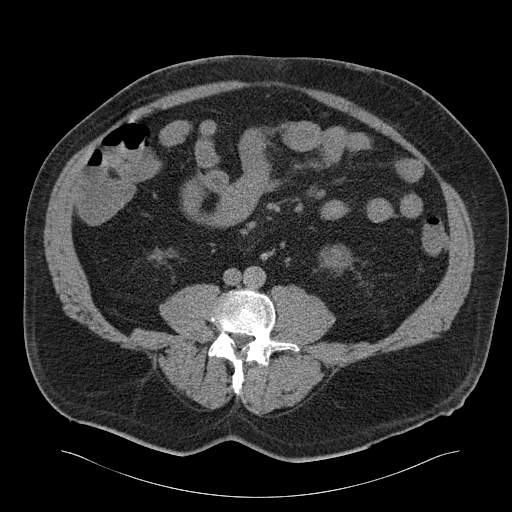
[im 53/95  soft-tissue]
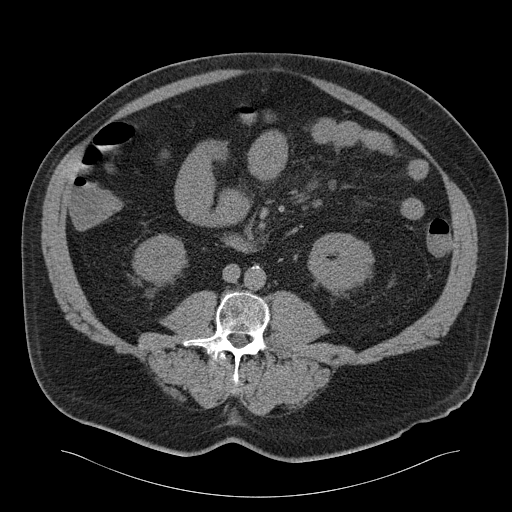
[im 61/95  soft-tissue]
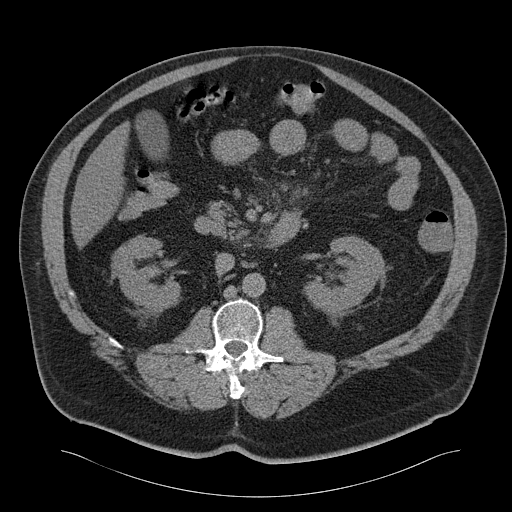
[im 61/95  bone]
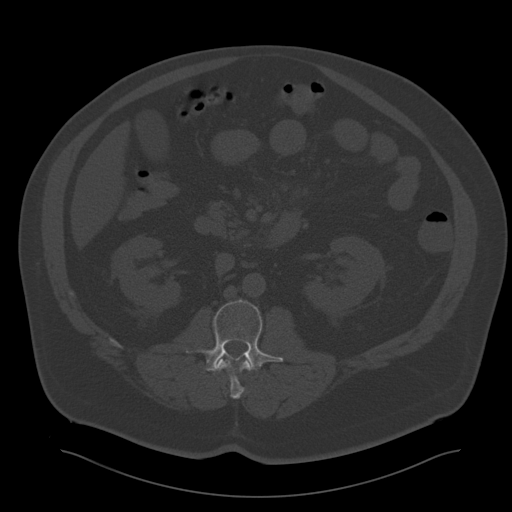
[im 68/95  soft-tissue]
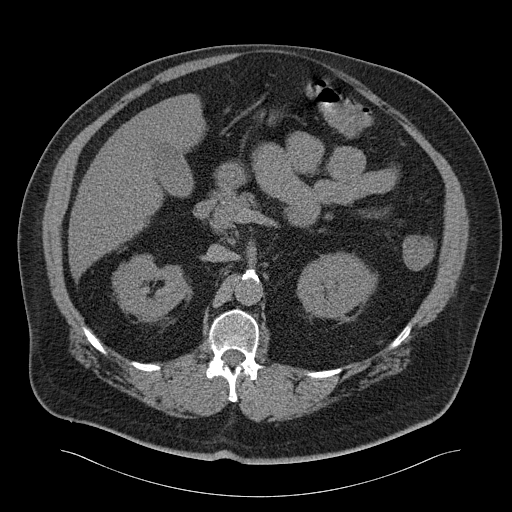
[im 76/95  soft-tissue]
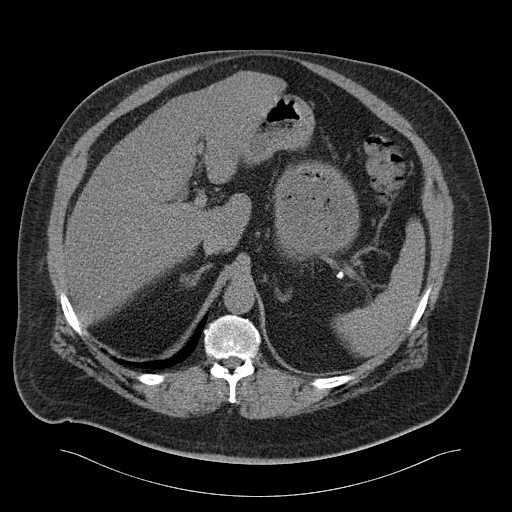
[im 83/95  soft-tissue]
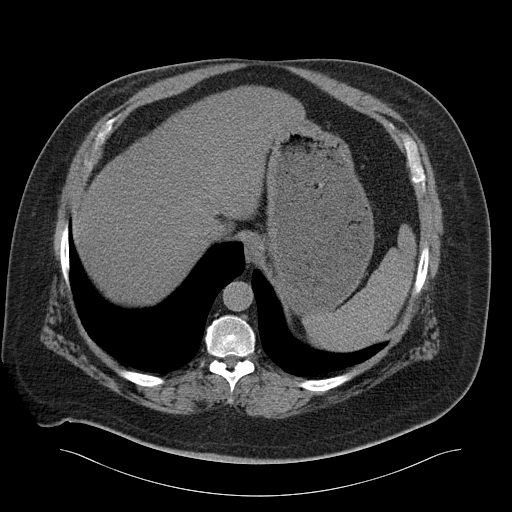
[im 91/95  soft-tissue]
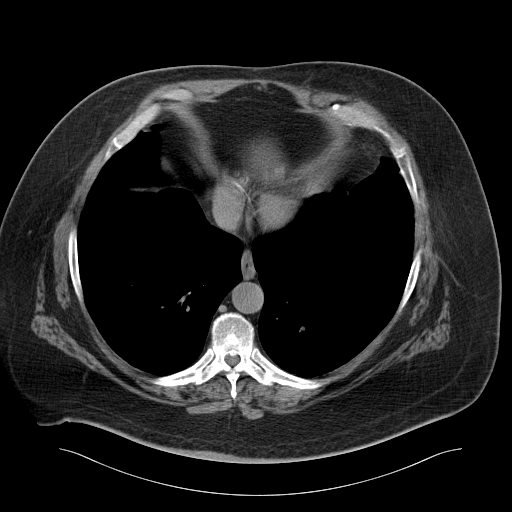

[Series 5: cor stone standard full · coronal · 0.86mm/px · 3 of 179 slices shown]
[im 60/179  soft-tissue]
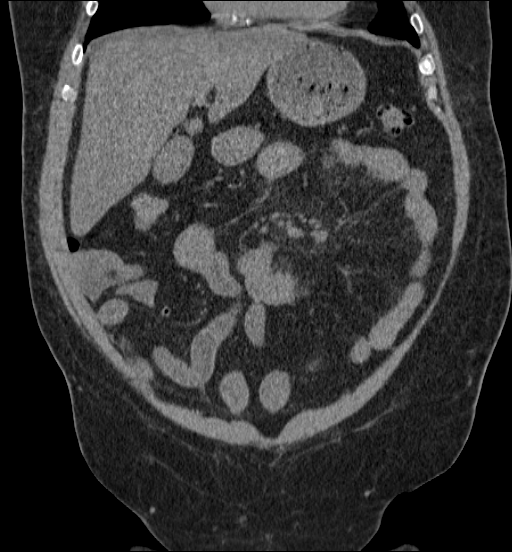
[im 80/179  soft-tissue]
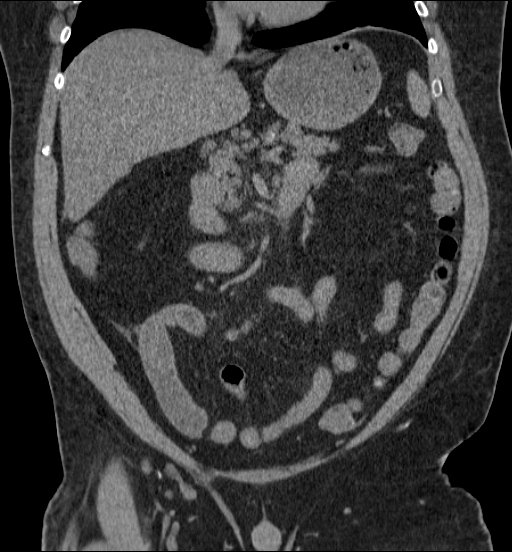
[im 99/179  soft-tissue]
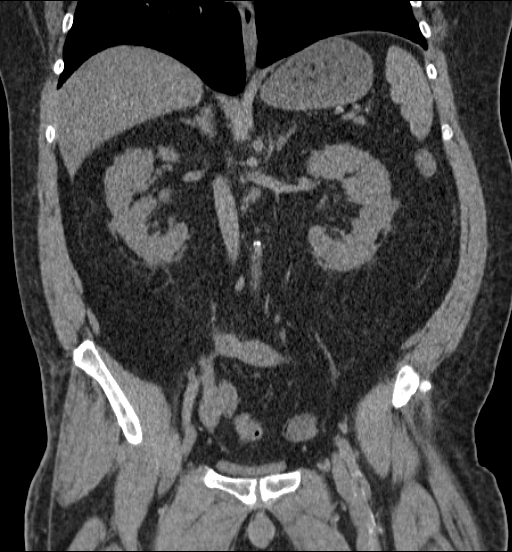

[16 of 46 positions shown; findings below may reference images not displayed]

FINDINGS: Stable bilateral punctate lower lobe calcified granulomas. Lung
bases otherwise clear. Normal heart size. No pericardial or pleural
effusion. No hilar hernia.

Abdomen: Kidneys demonstrate chronic perinephric stranding edema. No
obstructing ureteral calculus, hydroureter, hydronephrosis, or
obstructive uropathy. Ureters are decompressed and symmetric.

Liver, gallbladder, biliary system, pancreas, spleen, and adrenal
glands are within normal limits for noncontrast imaging and
demonstrate no acute process. Atherosclerosis of the aorta without
aneurysm or retroperitoneal abnormality.

Negative for bowel obstruction, significant dilatation, ileus, or
free air. Normal appendix demonstrated.

No abdominal free fluid, fluid collection, hemorrhage, or abscess.

Central mesenteric root prominent lymph nodes noted with surrounding
hazy attenuation can be seen with mild mesenteric
adenitis/panniculitis. This is nonspecific and best demonstrated on
images 33 through 48.

Pelvis: No pelvic free fluid, fluid collection, hemorrhage, abscess
or adenopathy. No inguinal hernia. Urinary bladder unremarkable. No
acute distal bowel process. Iliac and femoral atherosclerosis noted.

Minor degenerative changes of the lower lumbar spine.
IMPRESSION: No acute obstructing ureteral calculus, hydronephrosis, or
obstructive uropathy.

Chronic nonspecific perinephric stranding edema

Mild central mesenteric adenitis/panniculitis.

No other acute intra-abdominal or pelvic finding.

## 2015-11-06 IMAGING — CR DG CHEST 1V PORT
1 series · 1 of 1 positions shown · non-contrast
Comparison: 03/01/2013

CLINICAL DATA: Cough and vomiting.

EXAM:
PORTABLE CHEST - 1 VIEW

[ap]
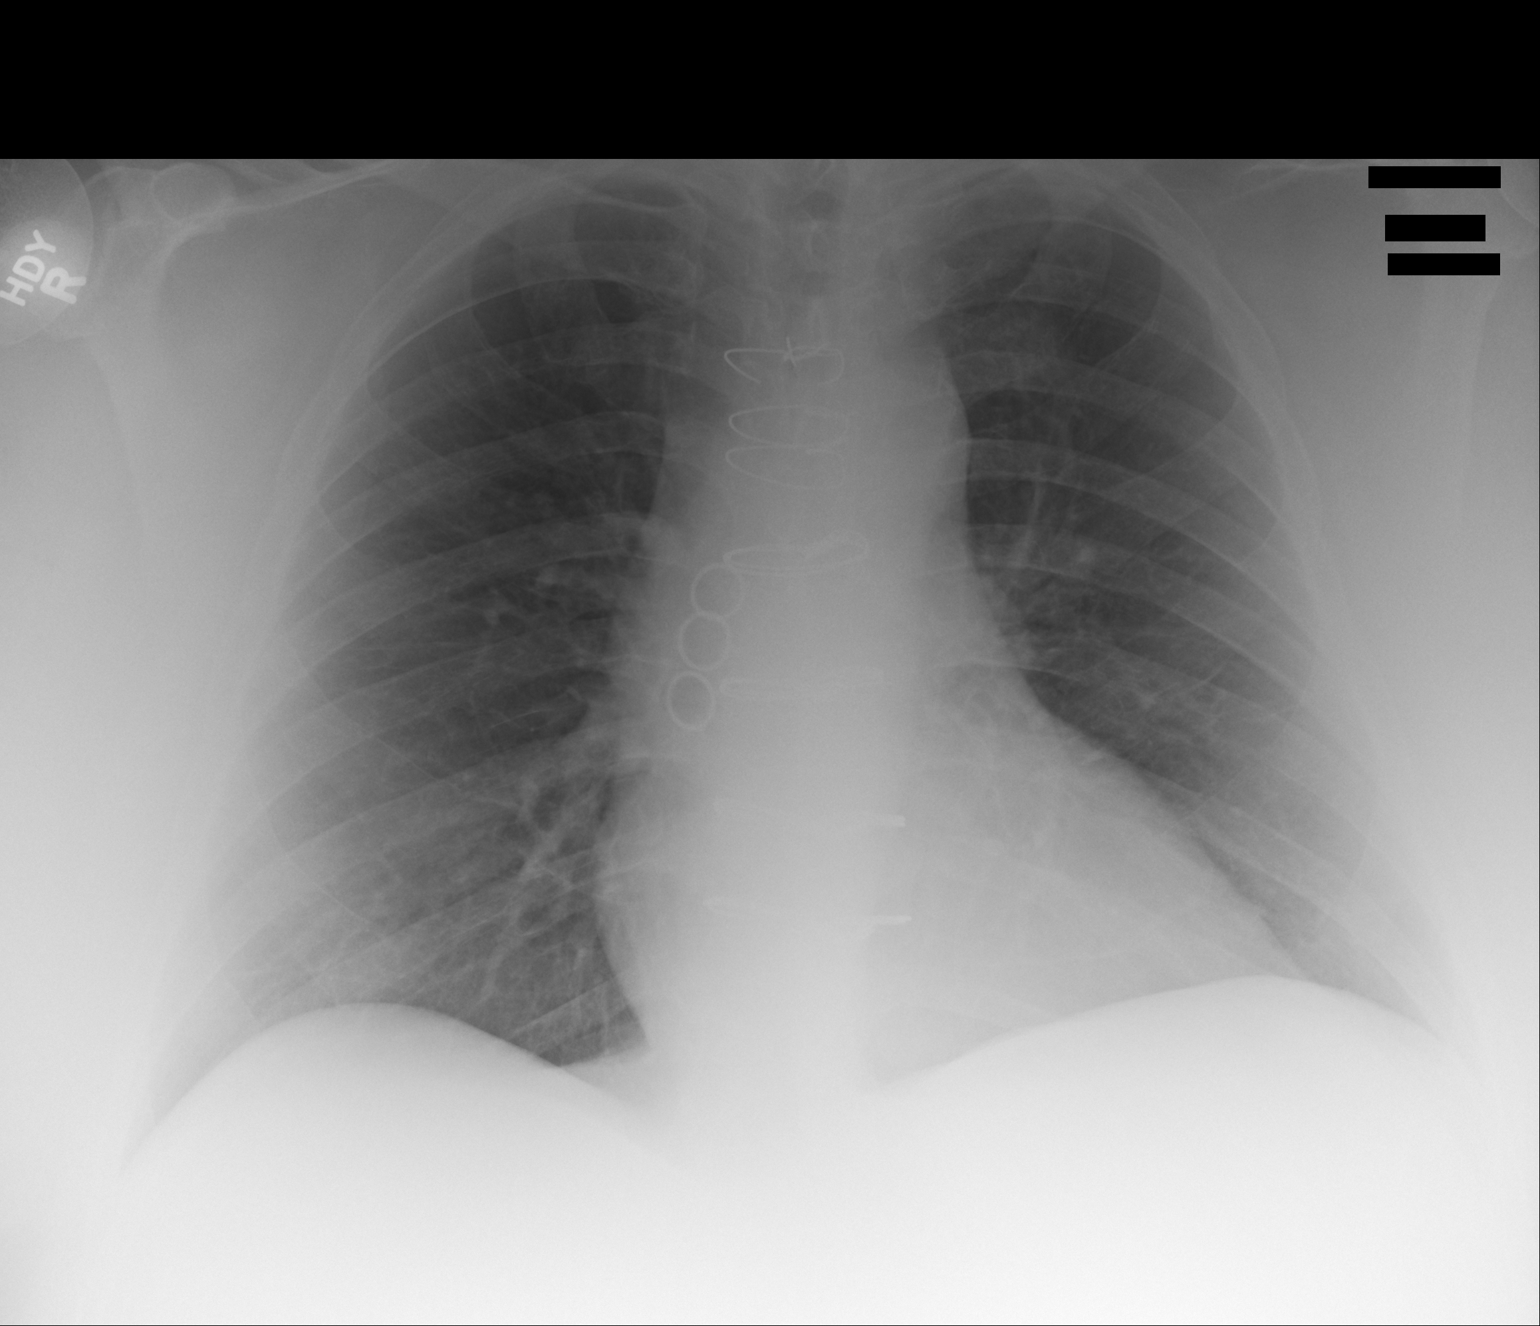

[1 of 1 positions shown; findings below may reference images not displayed]

FINDINGS: Post CABG changes in the chest. Lungs are clear bilaterally. Heart
size is normal. Trachea is midline. No acute bone abnormality.
IMPRESSION: No acute chest disease.

## 2015-11-15 DIAGNOSIS — I350 Nonrheumatic aortic (valve) stenosis: Secondary | ICD-10-CM

## 2015-11-15 HISTORY — DX: Nonrheumatic aortic (valve) stenosis: I35.0

## 2015-11-16 ENCOUNTER — Encounter: Payer: Self-pay | Admitting: Cardiovascular Disease

## 2015-11-16 ENCOUNTER — Ambulatory Visit (INDEPENDENT_AMBULATORY_CARE_PROVIDER_SITE_OTHER): Payer: Commercial Managed Care - HMO | Admitting: Cardiovascular Disease

## 2015-11-16 VITALS — BP 148/52 | HR 64 | Ht 69.0 in | Wt 286.1 lb

## 2015-11-16 DIAGNOSIS — R0602 Shortness of breath: Secondary | ICD-10-CM | POA: Diagnosis not present

## 2015-11-16 DIAGNOSIS — I350 Nonrheumatic aortic (valve) stenosis: Secondary | ICD-10-CM | POA: Diagnosis not present

## 2015-11-16 DIAGNOSIS — I5033 Acute on chronic diastolic (congestive) heart failure: Secondary | ICD-10-CM

## 2015-11-16 DIAGNOSIS — I359 Nonrheumatic aortic valve disorder, unspecified: Secondary | ICD-10-CM | POA: Diagnosis not present

## 2015-11-16 LAB — BASIC METABOLIC PANEL
BUN: 20 mg/dL (ref 7–25)
CALCIUM: 9.2 mg/dL (ref 8.6–10.3)
CO2: 28 mmol/L (ref 20–31)
Chloride: 103 mmol/L (ref 98–110)
Creat: 1.08 mg/dL (ref 0.70–1.25)
GLUCOSE: 367 mg/dL — AB (ref 65–99)
Potassium: 4.2 mmol/L (ref 3.5–5.3)
Sodium: 140 mmol/L (ref 135–146)

## 2015-11-16 LAB — CBC
HCT: 30.7 % — ABNORMAL LOW (ref 38.5–50.0)
Hemoglobin: 9 g/dL — ABNORMAL LOW (ref 13.2–17.1)
MCH: 21.9 pg — AB (ref 27.0–33.0)
MCHC: 29.3 g/dL — AB (ref 32.0–36.0)
MCV: 74.7 fL — ABNORMAL LOW (ref 80.0–100.0)
Platelets: 192 10*3/uL (ref 140–400)
RBC: 4.11 MIL/uL — ABNORMAL LOW (ref 4.20–5.80)
RDW: 17.5 % — AB (ref 11.0–15.0)
WBC: 6.7 10*3/uL (ref 3.8–10.8)

## 2015-11-16 LAB — PROTIME-INR
INR: 1.34 (ref <1.50)
PROTHROMBIN TIME: 16.7 s — AB (ref 11.6–15.2)

## 2015-11-16 NOTE — Patient Instructions (Signed)
Medication Instructions:  Your physician recommends that you continue on your current medications as directed. Please refer to the Current Medication list given to you today.  Labwork: Your physician recommends that you have lab work today: BMP, CBC and PT/INR  Testing/Procedures: Your physician has requested that you have a TEE. During a TEE, sound waves are used to create images of your heart. It provides your doctor with information about the size and shape of your heart and how well your heart's chambers and valves are working. In this test, a transducer is attached to the end of a flexible tube that's guided down your throat and into your esophagus (the tube leading from you mouth to your stomach) to get a more detailed image of your heart. You are not awake for the procedure. Please see the instruction sheet given to you today. For further information please visit [email or web address].  Your physician has requested that you have a cardiac catheterization. Cardiac catheterization is used to diagnose and/or treat various heart conditions. Doctors may recommend this procedure for a number of different reasons. The most common reason is to evaluate chest pain. Chest pain can be a symptom of coronary artery disease (CAD), and cardiac catheterization can show whether plaque is narrowing or blocking your heart's arteries. This procedure is also used to evaluate the valves, as well as measure the blood flow and oxygen levels in different parts of your heart. For further information please visit [email or web address]. Please follow instruction sheet, as given.  Follow-Up: We will arrange further evaluation after tests are complete.   Any Other Special Instructions Will Be Listed Below (If Applicable).     If you need a refill on your cardiac medications before your next appointment, please call your pharmacy.

## 2015-11-16 NOTE — Progress Notes (Signed)
Cardiology Office Note Date:  11/16/2015   ID:  Marcus Williams, Marcus Williams 04/07/1956, MRN [id number]  PCP:  Idelle Crouch, MD  Cardiologist:  Bartholome Bill, MD  Chief Complaint  Patient presents with  . New Patient (Initial Visit)    TAVR consult. Has shortness of breath. Denies chest pain, LEE, or claudication   History of Present Illness: Marcus Williams is a 60 y.o. male who presents for evaluation of aortic stenosis. The patient has an extensive medical history and has been followed closely by Dr Doy Hutching for general medical care and Dr Ubaldo Glassing for cardiac problems. He underwent multivessel CABG at age 59. He's had long-standing obesity. The patient worked until about 4 years ago. He is disabled from cardiac and medical problems. He has had long-standing obstructive sleep apnea and has been on CPAP for 10 years. He has paroxysmal atrial fibrillation and has been maintained on long-term anticoagulation. He denies any history of bleeding problems. He has long-standing diabetes and has been insulin-dependent for approximately 10 years. The patient has been hospitalized for pneumonia about 2 years ago by his report. 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  He states that over the last 3 months he has developed progressive shortness of breath with activity. He's been on home oxygen now for the last 3 weeks. He has undergone extensive evaluation with echocardiography, chest CT, and cardiac catheterization. On his most recent echocardiogram he was found to have progressive aortic stenosis and is referred for further evaluation.  The patient admits to worsening dyspnea. He now is short of breath with very low-level activity. He also complains of swelling and orthopnea. Complains of cough but no fever or chills. He denies chest pain or pressure. He denies lightheadedness or syncope.  The patient's most recent echocardiogram is reviewed and  shows a peak transaortic velocity of 350 cm/s, peak and mean transaortic valve gradients of 49 and 25 mmHg, respectively, and a calculated aortic valve area of 1.1 cm. Cardiac catheterization films are reviewed and shows continued patency of all of the patient's bypass grafts with severe native coronary disease noted.  Past Medical History  Diagnosis Date  . MI (myocardial infarction) ([place])   . Sepsis ([place])   . History of cardioversion   . Diabetes mellitus without complication ([place])   . Hypertension   . CAD (coronary artery disease)   . SVT (supraventricular tachycardia) ([place])   . OSA on CPAP   . A-fib ([place])   . Chronic systolic CHF (congestive heart failure) [facility])     Past Surgical History  Procedure Laterality Date  . Quadruple bypass    . Knee surgery    . Bunionectomy    . Coronary artery bypass graft    . 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 Cardiac catheterization N/A 10/02/2015    Procedure: Coronary/Grafts Angiography;  Surgeon: Teodoro Spray, MD;  Location: [facility];  Service: Cardiovascular;  Laterality: N/A;    Current Outpatient Prescriptions  Medication Sig Dispense Refill  . albuterol (PROVENTIL HFA;VENTOLIN HFA) 108 (90 Base) MCG/ACT inhaler Inhale 2 puffs into the lungs every 6 (six) hours as needed for wheezing or shortness of breath. 1 Inhaler 10  . ALPRAZolam (XANAX) 0.25 MG tablet Take 0.25 mg by mouth 2 (two) times daily as needed  for anxiety.    Marland Kitchen aspirin EC 325 MG tablet Take 325 mg by mouth at bedtime.     Marland Kitchen diltiazem (CARDIZEM CD) 300 MG 24 hr capsule Take 1 capsule (300 mg total) by mouth daily. 30 capsule 5  . fluticasone (FLONASE) 50 MCG/ACT nasal spray Place 1 spray into both nostrils daily as needed for allergies.     . Fluticasone-Salmeterol (ADVAIR DISKUS) 250-50 MCG/DOSE AEPB Inhale 1 puff into the lungs 2 (two) times  daily as needed (For shortness of breath.).     Marland Kitchen furosemide (LASIX) 40 MG tablet Take 1 tablet (40 mg total) by mouth 2 (two) times daily. 60 tablet 5  . glimepiride (AMARYL) 4 MG tablet Take 4 mg by mouth daily.    . insulin NPH Human (HUMULIN N,NOVOLIN N) 100 UNIT/ML injection Inject 60 Units into the skin 2 (two) times daily.     Marland Kitchen ipratropium-albuterol (DUONEB) 0.5-2.5 (3) MG/3ML SOLN Take 3 mLs by nebulization every 4 (four) hours as needed (for shortness of breath).    . meloxicam (MOBIC) 15 MG tablet Take 15 mg by mouth daily as needed for pain.     . metFORMIN (GLUCOPHAGE) 1000 MG tablet Take 1,000 mg by mouth 2 (two) times daily with a meal.    . metoprolol tartrate (LOPRESSOR) 25 MG tablet Take 1 tablet (25 mg total) by mouth 2 (two) times daily. 60 tablet 5  . nitroGLYCERIN (NITROSTAT) 0.4 MG SL tablet Place 0.4 mg under the tongue every 5 (five) minutes as needed for chest pain.    . pantoprazole (PROTONIX) 40 MG tablet Take 40 mg by mouth daily.    . Rivaroxaban (XARELTO) 15 MG TABS tablet Take 15 mg by mouth daily with breakfast.    . simvastatin (ZOCOR) 40 MG tablet Take 40 mg by mouth at bedtime.     . traZODone (DESYREL) 100 MG tablet Take 100 mg by mouth at bedtime.    . metoprolol (LOPRESSOR) 100 MG tablet Take 100 mg by mouth 2 (two) times daily.     No current facility-administered medications for this visit.    Allergies:   Review of patient's allergies indicates no known allergies.   Social History:  The patient  reports that he has never smoked. He does not have any smokeless tobacco history on file. He reports that he does not drink alcohol or use illicit drugs.   Family History:  The patient's  family history includes Diabetes in his brother and brother; Heart attack in his brother, father, and mother; Heart disease in his brother; Hypertension in his brother, father, and mother; Lung disease in his brother; Stroke in his mother.    ROS:  Please see the history of  present illness.  Otherwise, review of systems is positive for orthopnea.  All other systems are reviewed and negative.    PHYSICAL EXAM: VS:  BP 148/52 mmHg  Pulse 64  Ht 5\' 9"  (1.753 m)  Wt 286 lb 1.9 oz (129.783 kg)  BMI 42.23 kg/m2 , BMI Body mass index is 42.23 kg/(m^2). GEN: Well nourished, well developed, obese male, chronically ill-appearing, in no acute distress HEENT: normal Neck: JVP elevated in the sitting position, no masses. Bilateral carotid bruits Cardiac: RRR with 3/6 systolic murmur best heard at the apex, crescendo/decrescendo pattern               Respiratory:  clear to auscultation bilaterally, normal work of breathing GI: soft, nontender, nondistended, + BS MS: no deformity or  atrophy Ext: 1+ edema on the left, trace edema on the right, chronic stasis changes, small ulcerations on the anterior aspect of the left lower leg. Neuro:  Strength and sensation are intact Psych: euthymic mood, full affect  EKG:  EKG is not ordered today.  Recent Labs: 03/26/2015: B Natriuretic Peptide 199.0* 05/04/2015: ALT 27 106/14/2016: Magnesium 1.8 10/14/2015: BUN 16; Creatinine, Ser 0.93; Hemoglobin 10.7*; Platelets 184; Potassium 3.9; Sodium 140   Lipid Panel     Component Value Date/Time   CHOL 104 07/20/2014 0453   TRIG 131 07/20/2014 0453   HDL 43 07/20/2014 0453   VLDL 26 07/20/2014 0453   LDLCALC 35 07/20/2014 0453      Wt Readings from Last 3 Encounters:  11/16/15 286 lb 1.9 oz (129.783 kg)  10/14/15 265 lb (120.203 kg)  10/02/15 275 lb (124.739 kg)     Cardiac Studies Reviewed: Chest x-ray 10/14/2015: FINDINGS: There is no focal parenchymal opacity. There is no pleural effusion or pneumothorax. The heart and mediastinal contours are unremarkable. There is evidence of prior CABG.  The osseous structures are unremarkable.  IMPRESSION: No active disease.  CT chest 09/19/2015: IMPRESSION: 1. There is linear scarring bilaterally as noted above. No  active infiltrate or effusion is seen and no suspicious lung nodule is noted. 2. Calcified granulomas bilaterally appear 3. 4.0 cm mid ascending thoracic aorta. Recommend annual imaging followup by CTA or MRA. This recommendation follows 2010 ACCF/AHA/AATS/ACR/ASA/SCA/SCAI/SIR/STS/SVM Guidelines for the Diagnosis and Management of Patients with Thoracic Aortic Disease. Circulation. 2010; 121: [id number] . 4. Slight cortical regularity of the anterolateral right seventh rib. Possible subacute or old fracture.  2-D echocardiogram 09/25/2015: ECHOCARDIOGRAPHIC MEASUREMENTS 2D DIMENSIONS AORTA Values Normal Range MAIN PA Values Normal Range Annulus: nm* [2.3 - 2.9] PA Main: nm* [1.5 - 2.1] Aorta Sin: nm* [3.1 - 3.7] RIGHT VENTRICLE ST Junction: nm* [2.6 - 3.2] RV Base: 5.2 cm [ < 4.2] Asc.Aorta: nm* [2.6 - 3.4] RV Mid: nm* [ < 3.5] LEFT VENTRICLE RV Length: nm* [ < 8.6] LVIDd: 6.8 cm [4.2 - 5.9] INFERIOR VENA CAVA LVIDs: 5.1 cm Max. IVC: nm* [ <= 2.1] FS: 25.4 % [> 25] Min. IVC: nm* SWT: 1.2 cm [0.6 - 1.0] ------------------ PWT: 1.2 cm [0.6 - 1.0] nm* - not measured LEFT ATRIUM LA Diam: 5.1 cm [3.0 - 4.0] LA A4C Area: nm* [ < 20] LA Volume: nm* [18 - 58]  ___________________________________________________________________________________________ ECHOCARDIOGRAPHIC DESCRIPTIONS  AORTIC ROOT Size:Normal Dissection:INDETERM FOR DISSECTION  AORTIC VALVE Leaflets:BICUSPID Morphology:MODERATELY THICKENED Mobility:PARTIALLY MOBILE  LEFT VENTRICLE Size:Normal Anterior:Normal Contraction:Normal Lateral:Normal Closest EF:>55% (Estimated) Septal:Normal LV Masses:No Masses Apical:Normal [id number] LVH Inferior:Normal Posterior:Normal Dias.FxClass:(Grade 1) relaxation abnormal, E/A reversal  MITRAL VALVE Leaflets:Normal Mobility:Fully mobile Morphology:Normal  LEFT ATRIUM Size:MILDLY ENLARGED LA Masses:No masses IA Septum:Normal IAS  MAIN PA Size:Normal  PULMONIC  VALVE Morphology:Normal Mobility:Fully mobile  RIGHT VENTRICLE Size:MILDLY ENLARGED Free Wall:Normal Contraction:Normal RV Masses:No Masses  TRICUSPID VALVE Leaflets:Normal Mobility:Fully mobile Morphology:Normal  RIGHT ATRIUM Size:MILDLY ENLARGED RA Other:None RA Mass:No masses  PERICARDIUM Fluid:No effusion  INFERIOR VENACAVA Size:DILATED ABNORMAL RESPIRATORY COLLAPSE   _____________________________________________________________________ DOPPLER ECHO and OTHER SPECIAL PROCEDURES Aortic:MILD AR MILD AS 351.0 cm/sec peak vel 49.3 mmHg peak grad 25.3 mmHg mean grad 1.1 cm^2 by DOPPLER  Mitral:TRIVIAL MR No MS 3.3 cm^2 by DOPPLER MV Inflow E Vel=121.0 cm/sec MV Annulus E'Vel=6.8 cm/sec E/E'Ratio=17.8  Tricuspid:MILD TR No TS 327.0 cm/sec peak TR vel 52.8 mmHg peak RV pressure  Pulmonary:TRIVIAL PR No PS  Cardiac catheterization 10/02/2015: Procedures  Coronary/Grafts Angiography    Conclusion     Prox Cx lesion, 100% stenosed.  Prox LAD lesion, 100% stenosed.  Dist RCA lesion, 100% stenosed.  SVG was injected is normal in caliber.  The graft exhibits minimal luminal irregularities.  was injected is normal in caliber.  The graft exhibits minimal luminal irregularities.  SVG was injected is normal in caliber, large.  The graft exhibits minimal luminal irregularities.  LIMA was injected is normal in caliber.  The graft exhibits minimal luminal irregularities.  Mid Graft lesion, 10% stenosed. The lesion was previously treated with a stent (unknown type).  Prox Graft lesion, 30% stenosed.  Three vessel native disease with patent svg to d1, om1 and rpda. Patent lima to lad. Aortic valve bicuspid and moderately stenoses by echo. Not crossed. . WIll reevaluate aortic valve and consider aortic valve intervention if found to be severely stenosed.     STS Risk Calculator: STS-PROM 6.8%  ASSESSMENT AND PLAN: 60 yo male with multiple medical  problems who has Acute on Chronic Diastolic CHF, NYHA III with moderate-severe likely bicuspid aortic valve stenosis, Stage D.   I have personally reviewed his cardiac cath films, echo images, and extensive data through [place] including labs, radiographic studies, and office notes. Serial echo studies show progression of aortic valve gradients over the past year, and the patient's symptoms have progressed significantly over just a few months. Despite that, the valve appears to open moderately well on 2D imaging and mean transvalvular gradients measure less than 30 mmHg.  I have reviewed the natural history of aortic stenosis with the patient today. We have discussed the limitations of medical therapy and the poor prognosis associated with symptomatic aortic stenosis. We have reviewed potential treatment options, including palliative medical therapy, conventional surgical aortic valve replacement, and transcatheter aortic valve replacement. We discussed treatment options in the context of this patient's specific comorbid medical conditions.   Because of poor acoustic windows on surface echo imaging likely related to this patient's body habitus, as well as uncertainty of the impact of aortic stenosis on his symptoms of heart failure, I think further studies are indicated. The patient will undergo right and left heart catheterization for hemodynamic assessment and TEE imaging with anesthesia support considering his tenuous respiratory status and morbid obesity. I have reviewed the risks and indications of both procedures with the patient and he understands and agrees to proceed. If he ultimately requires aortic valve replacement, TAVR would be a reasonable treatment option as long as his valve is anatomically suitable. If further studies indicate severe aortic stenosis, will arrange CT angiography of the heart and chest/abdomen/pelvis as well as formal cardiac surgical evaluation. Case discussed with Dr  Ubaldo Glassing.  Current medicines are reviewed with the patient today.  The patient does not have concerns regarding medicines.  Labs/ tests ordered today include:  Orders Placed This Encounter  Procedures  . Basic Metabolic Panel (BMET)  . CBC  . INR/PT   Disposition:   Hemodynamic right and left heart cath and TEE to be scheduled.   Deatra James, MD  11/16/2015 2:27 PM    [facility] [address], [place], [place]  [address] Phone: [phone]; Fax: [phone]

## 2015-11-20 ENCOUNTER — Other Ambulatory Visit: Payer: Self-pay | Admitting: Cardiovascular Disease

## 2015-11-20 DIAGNOSIS — I350 Nonrheumatic aortic (valve) stenosis: Secondary | ICD-10-CM

## 2015-11-21 ENCOUNTER — Encounter (HOSPITAL_COMMUNITY)
Admission: AD | Disposition: A | Discharge status: Home / Self Care | Payer: Self-pay | Source: Ambulatory Visit | Attending: Cardiovascular Disease

## 2015-11-21 ENCOUNTER — Encounter (HOSPITAL_COMMUNITY): Payer: Self-pay | Admitting: *Deleted

## 2015-11-21 ENCOUNTER — Inpatient Hospital Stay (HOSPITAL_COMMUNITY)
Admission: AD | Admit: 2015-11-21 | Discharge: 2015-11-23 | DRG: 287 | Disposition: A | Discharge status: Home / Self Care | Payer: Commercial Managed Care - HMO | Source: Ambulatory Visit | Attending: Cardiovascular Disease | Admitting: Cardiovascular Disease

## 2015-11-21 ENCOUNTER — Inpatient Hospital Stay (HOSPITAL_COMMUNITY): Payer: Commercial Managed Care - HMO

## 2015-11-21 ENCOUNTER — Ambulatory Visit (HOSPITAL_COMMUNITY): Payer: Commercial Managed Care - HMO | Admitting: Anesthesiology

## 2015-11-21 DIAGNOSIS — Z9981 Dependence on supplemental oxygen: Secondary | ICD-10-CM | POA: Diagnosis not present

## 2015-11-21 DIAGNOSIS — I252 Old myocardial infarction: Secondary | ICD-10-CM | POA: Diagnosis not present

## 2015-11-21 DIAGNOSIS — I110 Hypertensive heart disease with heart failure: Secondary | ICD-10-CM | POA: Diagnosis present

## 2015-11-21 DIAGNOSIS — R0602 Shortness of breath: Secondary | ICD-10-CM | POA: Diagnosis not present

## 2015-11-21 DIAGNOSIS — K921 Melena: Secondary | ICD-10-CM | POA: Diagnosis not present

## 2015-11-21 DIAGNOSIS — D649 Anemia, unspecified: Secondary | ICD-10-CM | POA: Diagnosis present

## 2015-11-21 DIAGNOSIS — Z79899 Other long term (current) drug therapy: Secondary | ICD-10-CM | POA: Diagnosis not present

## 2015-11-21 DIAGNOSIS — Z6841 Body Mass Index (BMI) 40.0 and over, adult: Secondary | ICD-10-CM

## 2015-11-21 DIAGNOSIS — G4733 Obstructive sleep apnea (adult) (pediatric): Secondary | ICD-10-CM | POA: Diagnosis present

## 2015-11-21 DIAGNOSIS — Z7901 Long term (current) use of anticoagulants: Secondary | ICD-10-CM | POA: Diagnosis not present

## 2015-11-21 DIAGNOSIS — I5033 Acute on chronic diastolic (congestive) heart failure: Secondary | ICD-10-CM | POA: Diagnosis present

## 2015-11-21 DIAGNOSIS — Z951 Presence of aortocoronary bypass graft: Secondary | ICD-10-CM

## 2015-11-21 DIAGNOSIS — Z794 Long term (current) use of insulin: Secondary | ICD-10-CM

## 2015-11-21 DIAGNOSIS — Z823 Family history of stroke: Secondary | ICD-10-CM

## 2015-11-21 DIAGNOSIS — Z7984 Long term (current) use of oral hypoglycemic drugs: Secondary | ICD-10-CM | POA: Diagnosis not present

## 2015-11-21 DIAGNOSIS — I350 Nonrheumatic aortic (valve) stenosis: Secondary | ICD-10-CM | POA: Diagnosis not present

## 2015-11-21 DIAGNOSIS — E119 Type 2 diabetes mellitus without complications: Secondary | ICD-10-CM | POA: Diagnosis present

## 2015-11-21 DIAGNOSIS — Z7982 Long term (current) use of aspirin: Secondary | ICD-10-CM | POA: Diagnosis not present

## 2015-11-21 DIAGNOSIS — I480 Paroxysmal atrial fibrillation: Secondary | ICD-10-CM | POA: Diagnosis not present

## 2015-11-21 DIAGNOSIS — E611 Iron deficiency: Secondary | ICD-10-CM | POA: Diagnosis not present

## 2015-11-21 DIAGNOSIS — Z8249 Family history of ischemic heart disease and other diseases of the circulatory system: Secondary | ICD-10-CM | POA: Diagnosis not present

## 2015-11-21 DIAGNOSIS — I2510 Atherosclerotic heart disease of native coronary artery without angina pectoris: Secondary | ICD-10-CM | POA: Diagnosis present

## 2015-11-21 DIAGNOSIS — I77819 Aortic ectasia, unspecified site: Secondary | ICD-10-CM | POA: Diagnosis present

## 2015-11-21 HISTORY — DX: Gastro-esophageal reflux disease without esophagitis: K21.9

## 2015-11-21 HISTORY — DX: Unspecified osteoarthritis, unspecified site: M19.90

## 2015-11-21 HISTORY — PX: TEE WITHOUT CARDIOVERSION: SHX5443

## 2015-11-21 HISTORY — DX: Nonrheumatic aortic (valve) stenosis: I35.0

## 2015-11-21 HISTORY — PX: CARDIAC CATHETERIZATION: SHX172

## 2015-11-21 LAB — POCT I-STAT 3, ART BLOOD GAS (G3+)
Acid-Base Excess: 1 mmol/L (ref 0.0–2.0)
Bicarbonate: 25.2 mEq/L — ABNORMAL HIGH (ref 20.0–24.0)
O2 Saturation: 96 %
PCO2 ART: 39.8 mmHg (ref 35.0–45.0)
PH ART: 7.409 (ref 7.350–7.450)
TCO2: 26 mmol/L (ref 0–100)
pO2, Arterial: 83 mmHg (ref 80.0–100.0)

## 2015-11-21 LAB — POCT I-STAT 3, VENOUS BLOOD GAS (G3P V)
ACID-BASE EXCESS: 1 mmol/L (ref 0.0–2.0)
ACID-BASE EXCESS: 1 mmol/L (ref 0.0–2.0)
Bicarbonate: 26.3 mEq/L — ABNORMAL HIGH (ref 20.0–24.0)
Bicarbonate: 26.3 mEq/L — ABNORMAL HIGH (ref 20.0–24.0)
O2 SAT: 54 %
O2 SAT: 56 %
PCO2 VEN: 45.5 mmHg (ref 45.0–50.0)
PH VEN: 7.38 — AB (ref 7.250–7.300)
TCO2: 28 mmol/L (ref 0–100)
TCO2: 28 mmol/L (ref 0–100)
pCO2, Ven: 44.5 mmHg — ABNORMAL LOW (ref 45.0–50.0)
pH, Ven: 7.37 — ABNORMAL HIGH (ref 7.250–7.300)
pO2, Ven: 29 mmHg — ABNORMAL LOW (ref 31.0–45.0)
pO2, Ven: 30 mmHg — ABNORMAL LOW (ref 31.0–45.0)

## 2015-11-21 LAB — GLUCOSE, CAPILLARY
GLUCOSE-CAPILLARY: 232 mg/dL — AB (ref 65–99)
GLUCOSE-CAPILLARY: 155 mg/dL — AB (ref 65–99)
Glucose-Capillary: 184 mg/dL — ABNORMAL HIGH (ref 65–99)

## 2015-11-21 LAB — POCT ACTIVATED CLOTTING TIME: ACTIVATED CLOTTING TIME: 191 s

## 2015-11-21 SURGERY — RIGHT AND LEFT HEART CATH

## 2015-11-21 SURGERY — ECHOCARDIOGRAM, TRANSESOPHAGEAL
Anesthesia: Monitor Anesthesia Care

## 2015-11-21 SURGERY — RIGHT/LEFT HEART CATH AND CORONARY ANGIOGRAPHY
Anesthesia: LOCAL

## 2015-11-21 MED ORDER — LIDOCAINE HCL (PF) 1 % IJ SOLN
INTRAMUSCULAR | Status: AC
  Filled 2015-11-21: qty 30

## 2015-11-21 MED ORDER — HEPARIN (PORCINE) IN NACL 2-0.9 UNIT/ML-% IJ SOLN
INTRAMUSCULAR | Status: AC
  Filled 2015-11-21: qty 1000

## 2015-11-21 MED ORDER — ALPRAZOLAM 0.25 MG PO TABS: 0.2500 mg | ORAL_TABLET | Freq: Two times a day (BID) | ORAL | Status: DC | PRN

## 2015-11-21 MED ORDER — INSULIN NPH (HUMAN) (ISOPHANE) 100 UNIT/ML ~~LOC~~ SUSP
60.0000 [IU] | Freq: Every day | SUBCUTANEOUS | Status: DC
  Administered 2015-11-22 – 2015-11-23 (×2): 60 [IU] via SUBCUTANEOUS

## 2015-11-21 MED ORDER — PROPOFOL 500 MG/50ML IV EMUL
INTRAVENOUS | Status: DC | PRN
  Administered 2015-11-21: 60 ug/kg/min via INTRAVENOUS

## 2015-11-21 MED ORDER — FENTANYL CITRATE (PF) 100 MCG/2ML IJ SOLN: 25.0000 ug | INTRAMUSCULAR | Status: DC | PRN

## 2015-11-21 MED ORDER — ONDANSETRON HCL 4 MG/2ML IJ SOLN: 4.0000 mg | Freq: Four times a day (QID) | INTRAMUSCULAR | Status: DC | PRN

## 2015-11-21 MED ORDER — NITROGLYCERIN 0.4 MG SL SUBL: 0.4000 mg | SUBLINGUAL_TABLET | SUBLINGUAL | Status: DC | PRN

## 2015-11-21 MED ORDER — PANTOPRAZOLE SODIUM 40 MG PO TBEC
40.0000 mg | DELAYED_RELEASE_TABLET | Freq: Every day | ORAL | Status: DC
  Administered 2015-11-21 – 2015-11-23 (×3): 40 mg via ORAL
  Filled 2015-11-21 (×3): qty 1

## 2015-11-21 MED ORDER — SODIUM CHLORIDE 0.9% FLUSH: 3.0000 mL | INTRAVENOUS | Status: DC | PRN

## 2015-11-21 MED ORDER — INSULIN NPH (HUMAN) (ISOPHANE) 100 UNIT/ML ~~LOC~~ SUSP
60.0000 [IU] | Freq: Every day | SUBCUTANEOUS | Status: DC
  Administered 2015-11-21: 60 [IU] via SUBCUTANEOUS

## 2015-11-21 MED ORDER — ONDANSETRON HCL 4 MG/2ML IJ SOLN: 4.0000 mg | Freq: Once | INTRAMUSCULAR | Status: DC | PRN

## 2015-11-21 MED ORDER — MIDAZOLAM HCL 2 MG/2ML IJ SOLN
INTRAMUSCULAR | Status: DC | PRN
  Administered 2015-11-21: 1 mg via INTRAVENOUS

## 2015-11-21 MED ORDER — TRAZODONE HCL 100 MG PO TABS
100.0000 mg | ORAL_TABLET | Freq: Every day | ORAL | Status: DC
  Administered 2015-11-21 – 2015-11-22 (×2): 100 mg via ORAL
  Filled 2015-11-21 (×2): qty 1

## 2015-11-21 MED ORDER — MIDAZOLAM HCL 2 MG/2ML IJ SOLN
INTRAMUSCULAR | Status: AC
  Filled 2015-11-21: qty 2

## 2015-11-21 MED ORDER — ALBUTEROL SULFATE HFA 108 (90 BASE) MCG/ACT IN AERS: 2.0000 | INHALATION_SPRAY | Freq: Four times a day (QID) | RESPIRATORY_TRACT | Status: DC | PRN

## 2015-11-21 MED ORDER — ALBUTEROL SULFATE (2.5 MG/3ML) 0.083% IN NEBU
INHALATION_SOLUTION | RESPIRATORY_TRACT | Status: AC
  Filled 2015-11-21: qty 3

## 2015-11-21 MED ORDER — BUTAMBEN-TETRACAINE-BENZOCAINE 2-2-14 % EX AERO
INHALATION_SPRAY | CUTANEOUS | Status: DC | PRN
  Administered 2015-11-21: 2 via TOPICAL

## 2015-11-21 MED ORDER — VERAPAMIL HCL 2.5 MG/ML IV SOLN
INTRAVENOUS | Status: AC
  Filled 2015-11-21: qty 2

## 2015-11-21 MED ORDER — METOPROLOL TARTRATE 100 MG PO TABS
100.0000 mg | ORAL_TABLET | Freq: Two times a day (BID) | ORAL | Status: DC
  Administered 2015-11-22 – 2015-11-23 (×3): 100 mg via ORAL
  Filled 2015-11-21 (×4): qty 1

## 2015-11-21 MED ORDER — INSULIN NPH (HUMAN) (ISOPHANE) 100 UNIT/ML ~~LOC~~ SUSP
60.0000 [IU] | Freq: Two times a day (BID) | SUBCUTANEOUS | Status: DC
  Filled 2015-11-21: qty 10

## 2015-11-21 MED ORDER — HEPARIN (PORCINE) IN NACL 2-0.9 UNIT/ML-% IJ SOLN
INTRAMUSCULAR | Status: DC | PRN
  Administered 2015-11-21: 1500 mL

## 2015-11-21 MED ORDER — FUROSEMIDE 10 MG/ML IJ SOLN
80.0000 mg | Freq: Once | INTRAMUSCULAR | Status: AC
  Administered 2015-11-21: 80 mg via INTRAVENOUS

## 2015-11-21 MED ORDER — SODIUM CHLORIDE 0.9 % IV SOLN: INTRAVENOUS | Status: DC

## 2015-11-21 MED ORDER — FUROSEMIDE 10 MG/ML IJ SOLN
80.0000 mg | Freq: Two times a day (BID) | INTRAMUSCULAR | Status: DC
  Administered 2015-11-21 – 2015-11-23 (×4): 80 mg via INTRAVENOUS
  Filled 2015-11-21 (×4): qty 8

## 2015-11-21 MED ORDER — FUROSEMIDE 10 MG/ML IJ SOLN
INTRAMUSCULAR | Status: AC
  Filled 2015-11-21: qty 8

## 2015-11-21 MED ORDER — SIMVASTATIN 40 MG PO TABS
40.0000 mg | ORAL_TABLET | Freq: Every day | ORAL | Status: DC
  Administered 2015-11-21 – 2015-11-22 (×2): 40 mg via ORAL
  Filled 2015-11-21 (×2): qty 1

## 2015-11-21 MED ORDER — ACETAMINOPHEN 325 MG PO TABS
650.0000 mg | ORAL_TABLET | ORAL | Status: DC | PRN
  Administered 2015-11-23: 650 mg via ORAL
  Filled 2015-11-21: qty 2

## 2015-11-21 MED ORDER — IPRATROPIUM-ALBUTEROL 0.5-2.5 (3) MG/3ML IN SOLN: 3.0000 mL | RESPIRATORY_TRACT | Status: DC | PRN

## 2015-11-21 MED ORDER — GLIMEPIRIDE 4 MG PO TABS
4.0000 mg | ORAL_TABLET | Freq: Every day | ORAL | Status: DC
  Administered 2015-11-22 – 2015-11-23 (×2): 4 mg via ORAL
  Filled 2015-11-21 (×2): qty 1

## 2015-11-21 MED ORDER — ASPIRIN 81 MG PO CHEW
81.0000 mg | CHEWABLE_TABLET | Freq: Once | ORAL | Status: AC
Start: 2015-11-21 — End: 2015-11-21
  Administered 2015-11-21: 81 mg via ORAL
  Filled 2015-11-21: qty 1

## 2015-11-21 MED ORDER — LIDOCAINE HCL (PF) 1 % IJ SOLN
INTRAMUSCULAR | Status: DC | PRN
  Administered 2015-11-21 (×2): 2 mL

## 2015-11-21 MED ORDER — FENTANYL CITRATE (PF) 100 MCG/2ML IJ SOLN
INTRAMUSCULAR | Status: DC | PRN
  Administered 2015-11-21: 25 ug via INTRAVENOUS

## 2015-11-21 MED ORDER — SODIUM CHLORIDE 0.9 % IV SOLN: 250.0000 mL | INTRAVENOUS | Status: DC | PRN

## 2015-11-21 MED ORDER — INSULIN ASPART 100 UNIT/ML ~~LOC~~ SOLN
0.0000 [IU] | Freq: Three times a day (TID) | SUBCUTANEOUS | Status: DC
  Administered 2015-11-21: 3 [IU] via SUBCUTANEOUS
  Administered 2015-11-22: 5 [IU] via SUBCUTANEOUS
  Administered 2015-11-22: 8 [IU] via SUBCUTANEOUS
  Administered 2015-11-22 – 2015-11-23 (×2): 3 [IU] via SUBCUTANEOUS
  Administered 2015-11-23: 8 [IU] via SUBCUTANEOUS

## 2015-11-21 MED ORDER — SODIUM CHLORIDE 0.9 % IV SOLN
INTRAVENOUS | Status: DC
  Administered 2015-11-21: 12:00:00 via INTRAVENOUS

## 2015-11-21 MED ORDER — HEPARIN SODIUM (PORCINE) 1000 UNIT/ML IJ SOLN
INTRAMUSCULAR | Status: DC | PRN
  Administered 2015-11-21: 5000 [IU] via INTRAVENOUS

## 2015-11-21 MED ORDER — ALBUTEROL SULFATE (2.5 MG/3ML) 0.083% IN NEBU
2.5000 mg | INHALATION_SOLUTION | Freq: Once | RESPIRATORY_TRACT | Status: AC
  Administered 2015-11-21: 2.5 mg via RESPIRATORY_TRACT

## 2015-11-21 MED ORDER — VERAPAMIL HCL 2.5 MG/ML IV SOLN
INTRAVENOUS | Status: DC | PRN
  Administered 2015-11-21: 10 mL via INTRA_ARTERIAL

## 2015-11-21 MED ORDER — HEPARIN SODIUM (PORCINE) 1000 UNIT/ML IJ SOLN
INTRAMUSCULAR | Status: AC
  Filled 2015-11-21: qty 1

## 2015-11-21 MED ORDER — FLUTICASONE PROPIONATE 50 MCG/ACT NA SUSP
1.0000 | Freq: Every day | NASAL | Status: DC | PRN
  Filled 2015-11-21: qty 16

## 2015-11-21 MED ORDER — LIDOCAINE HCL (CARDIAC) 20 MG/ML IV SOLN
INTRAVENOUS | Status: DC | PRN
  Administered 2015-11-21 (×2): 40 mg via INTRAVENOUS

## 2015-11-21 MED ORDER — DILTIAZEM HCL ER COATED BEADS 180 MG PO CP24
300.0000 mg | ORAL_CAPSULE | Freq: Every day | ORAL | Status: DC
  Administered 2015-11-21 – 2015-11-23 (×3): 300 mg via ORAL
  Filled 2015-11-21 (×3): qty 1

## 2015-11-21 MED ORDER — FENTANYL CITRATE (PF) 100 MCG/2ML IJ SOLN
INTRAMUSCULAR | Status: AC
  Filled 2015-11-21: qty 2

## 2015-11-21 MED ORDER — ALBUTEROL SULFATE (2.5 MG/3ML) 0.083% IN NEBU: 2.5000 mg | INHALATION_SOLUTION | Freq: Four times a day (QID) | RESPIRATORY_TRACT | Status: DC | PRN

## 2015-11-21 MED ORDER — RIVAROXABAN 15 MG PO TABS
15.0000 mg | ORAL_TABLET | Freq: Every day | ORAL | Status: DC
  Filled 2015-11-21: qty 1

## 2015-11-21 MED ORDER — SODIUM CHLORIDE 0.9% FLUSH
3.0000 mL | Freq: Two times a day (BID) | INTRAVENOUS | Status: DC
  Administered 2015-11-21 – 2015-11-23 (×4): 3 mL via INTRAVENOUS

## 2015-11-21 MED ORDER — PROPOFOL 10 MG/ML IV BOLUS
INTRAVENOUS | Status: DC | PRN
  Administered 2015-11-21 (×3): 15 mg via INTRAVENOUS

## 2015-11-21 MED ORDER — SODIUM CHLORIDE 0.9% FLUSH: 3.0000 mL | Freq: Two times a day (BID) | INTRAVENOUS | Status: DC

## 2015-11-21 SURGICAL SUPPLY — 15 items
CATH BALLN WEDGE 5F 110CM (CATHETERS) ×3 IMPLANT
CATH INFINITI 5FR AL1 (CATHETERS) ×3 IMPLANT
DEVICE RAD COMP TR BAND LRG (VASCULAR PRODUCTS) ×3 IMPLANT
GLIDESHEATH SLEND SS 6F .021 (SHEATH) ×3 IMPLANT
KIT HEART LEFT (KITS) ×3 IMPLANT
KIT HEART RIGHT NAMIC (KITS) IMPLANT
PACK CARDIAC CATHETERIZATION (CUSTOM PROCEDURE TRAY) ×3 IMPLANT
SHEATH FAST CATH BRACH 5F 5CM (SHEATH) ×3 IMPLANT
SYR MEDRAD MARK V 150ML (SYRINGE) IMPLANT
TRANSDUCER W/STOPCOCK (MISCELLANEOUS) ×3 IMPLANT
TUBING ART PRESS 72  MALE/FEM (TUBING) ×2
TUBING ART PRESS 72 MALE/FEM (TUBING) ×1 IMPLANT
TUBING CIL FLEX 10 FLL-RA (TUBING) ×3 IMPLANT
WIRE EMERALD ST .035X260CM (WIRE) ×3 IMPLANT
WIRE SAFE-T 1.5MM-J .035X260CM (WIRE) ×3 IMPLANT

## 2015-11-21 NOTE — Progress Notes (Signed)
Per patient feels sob, "I am wheezing".  Pt is audibly wheezing.  Sats 98% on RA.  Per pt uses nebulizer at home prn.  Has two inhalers on medication list.  Spoke with Dr. Burt Knack.  Verbal order for lasix 80mg  ivp and nebulizer albuterol.  Contacted respiratory therapy.

## 2015-11-21 NOTE — Progress Notes (Signed)
Echocardiogram Echocardiogram Transesophageal has been performed.  Joelene Millin 11/21/2015, 12:39 PM

## 2015-11-21 NOTE — Transfer of Care (Signed)
Immediate Anesthesia Transfer of Care Note  Patient: Marcus Williams  Procedure(s) Performed: Procedure(s): TRANSESOPHAGEAL ECHOCARDIOGRAM (TEE) (N/A)  Patient Location: Endoscopy Unit  Anesthesia Type:MAC  Level of Consciousness: responds to stimulation  Airway & Oxygen Therapy: Patient Spontanous Breathing and Patient connected to nasal cannula oxygen  Post-op Assessment: Report given to RN and Post -op Vital signs reviewed and stable  Post vital signs: Reviewed and stable  Last Vitals:  Filed Vitals:   11/21/15 1130 11/21/15 1152  BP: 123/72 123/31  Pulse: 56 48  Temp:  36.4 C  Resp: 17 16    Last Pain:  Filed Vitals:   11/21/15 1156  PainSc: Asleep      Patients Stated Pain Goal: 5 ([id number])  Complications: No apparent anesthesia complications

## 2015-11-21 NOTE — Progress Notes (Signed)
TR band removed.  Site covered with dressing.  No bleeding or bruising noted at site.  Patient denies any numbness, tingling to area.  Cap refill is normal.  Patient denies pain.  Will continue to monitor.

## 2015-11-21 NOTE — H&P (View-Only) (Signed)
Cardiology Office Note Date:  11/16/2015   ID:  Marcus Williams 04-13-1956, MRN :[id number]  PCP:  Idelle Crouch, MD  Cardiologist:  Bartholome Bill, MD  Chief Complaint  Patient presents with  . New Patient (Initial Visit)    TAVR consult. Has shortness of breath. Denies chest pain, LEE, or claudication   History of Present Illness: Marcus Williams is a 60 y.o. male who presents for evaluation of aortic stenosis. The patient has an extensive medical history and has been followed closely by Dr Doy Hutching for general medical care and Dr Ubaldo Glassing for cardiac problems. He underwent multivessel CABG at age 22. He's had long-standing obesity. The patient worked until about 4 years ago. He is disabled from cardiac and medical problems. He has had long-standing obstructive sleep apnea and has been on CPAP for 10 years. He has paroxysmal atrial fibrillation and has been maintained on long-term anticoagulation. He denies any history of bleeding problems. He has long-standing diabetes and has been insulin-dependent for approximately 10 years. The patient has been hospitalized for pneumonia about 2 years ago by his report. 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  He states that over the last 3 months he has developed progressive shortness of breath with activity. He's been on home oxygen now for the last 3 weeks. He has undergone extensive evaluation with echocardiography, chest CT, and cardiac catheterization. On his most recent echocardiogram he was found to have progressive aortic stenosis and is referred for further evaluation.  The patient admits to worsening dyspnea. He now is short of breath with very low-level activity. He also complains of swelling and orthopnea. Complains of cough but no fever or chills. He denies chest pain or pressure. He denies lightheadedness or syncope.  The patient's most recent echocardiogram is reviewed and  shows a peak transaortic velocity of 350 cm/s, peak and mean transaortic valve gradients of 49 and 25 mmHg, respectively, and a calculated aortic valve area of 1.1 cm. Cardiac catheterization films are reviewed and shows continued patency of all of the patient's bypass grafts with severe native coronary disease noted.  Past Medical History  Diagnosis Date  . MI (myocardial infarction) ([place])   . Sepsis ([place])   . History of cardioversion   . Diabetes mellitus without complication ([place])   . Hypertension   . CAD (coronary artery disease)   . SVT (supraventricular tachycardia) ([place])   . OSA on CPAP   . A-fib ([place])   . Chronic systolic CHF (congestive heart failure) [facility])     Past Surgical History  Procedure Laterality Date  . Quadruple bypass    . Knee surgery    . Bunionectomy    . Coronary artery bypass graft    . 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 Cardiac catheterization N/A 10/02/2015    Procedure: Coronary/Grafts Angiography;  Surgeon: Teodoro Spray, MD;  Location: [facility];  Service: Cardiovascular;  Laterality: N/A;    Current Outpatient Prescriptions  Medication Sig Dispense Refill  . albuterol (PROVENTIL HFA;VENTOLIN HFA) 108 (90 Base) MCG/ACT inhaler Inhale 2 puffs into the lungs every 6 (six) hours as needed for wheezing or shortness of breath. 1 Inhaler 10  . ALPRAZolam (XANAX) 0.25 MG tablet Take 0.25 mg by mouth 2 (two) times daily as needed  for anxiety.    Marland Kitchen aspirin EC 325 MG tablet Take 325 mg by mouth at bedtime.     Marland Kitchen diltiazem (CARDIZEM CD) 300 MG 24 hr capsule Take 1 capsule (300 mg total) by mouth daily. 30 capsule 5  . fluticasone (FLONASE) 50 MCG/ACT nasal spray Place 1 spray into both nostrils daily as needed for allergies.     . Fluticasone-Salmeterol (ADVAIR DISKUS) 250-50 MCG/DOSE AEPB Inhale 1 puff into the lungs 2 (two) times  daily as needed (For shortness of breath.).     Marland Kitchen furosemide (LASIX) 40 MG tablet Take 1 tablet (40 mg total) by mouth 2 (two) times daily. 60 tablet 5  . glimepiride (AMARYL) 4 MG tablet Take 4 mg by mouth daily.    . insulin NPH Human (HUMULIN N,NOVOLIN N) 100 UNIT/ML injection Inject 60 Units into the skin 2 (two) times daily.     Marland Kitchen ipratropium-albuterol (DUONEB) 0.5-2.5 (3) MG/3ML SOLN Take 3 mLs by nebulization every 4 (four) hours as needed (for shortness of breath).    . meloxicam (MOBIC) 15 MG tablet Take 15 mg by mouth daily as needed for pain.     . metFORMIN (GLUCOPHAGE) 1000 MG tablet Take 1,000 mg by mouth 2 (two) times daily with a meal.    . metoprolol tartrate (LOPRESSOR) 25 MG tablet Take 1 tablet (25 mg total) by mouth 2 (two) times daily. 60 tablet 5  . nitroGLYCERIN (NITROSTAT) 0.4 MG SL tablet Place 0.4 mg under the tongue every 5 (five) minutes as needed for chest pain.    . pantoprazole (PROTONIX) 40 MG tablet Take 40 mg by mouth daily.    . Rivaroxaban (XARELTO) 15 MG TABS tablet Take 15 mg by mouth daily with breakfast.    . simvastatin (ZOCOR) 40 MG tablet Take 40 mg by mouth at bedtime.     . traZODone (DESYREL) 100 MG tablet Take 100 mg by mouth at bedtime.    . metoprolol (LOPRESSOR) 100 MG tablet Take 100 mg by mouth 2 (two) times daily.     No current facility-administered medications for this visit.    Allergies:   Review of patient's allergies indicates no known allergies.   Social History:  The patient  reports that he has never smoked. He does not have any smokeless tobacco history on file. He reports that he does not drink alcohol or use illicit drugs.   Family History:  The patient's  family history includes Diabetes in his brother and brother; Heart attack in his brother, father, and mother; Heart disease in his brother; Hypertension in his brother, father, and mother; Lung disease in his brother; Stroke in his mother.    ROS:  Please see the history of  present illness.  Otherwise, review of systems is positive for orthopnea.  All other systems are reviewed and negative.    PHYSICAL EXAM: VS:  BP 148/52 mmHg  Pulse 64  Ht 5\' 9"  (1.753 m)  Wt 286 lb 1.9 oz (129.783 kg)  BMI 42.23 kg/m2 , BMI Body mass index is 42.23 kg/(m^2). GEN: Well nourished, well developed, obese male, chronically ill-appearing, in no acute distress HEENT: normal Neck: JVP elevated in the sitting position, no masses. Bilateral carotid bruits Cardiac: RRR with 3/6 systolic murmur best heard at the apex, crescendo/decrescendo pattern               Respiratory:  clear to auscultation bilaterally, normal work of breathing GI: soft, nontender, nondistended, + BS MS: no deformity or  atrophy Ext: 1+ edema on the left, trace edema on the right, chronic stasis changes, small ulcerations on the anterior aspect of the left lower leg. Neuro:  Strength and sensation are intact Psych: euthymic mood, full affect  EKG:  EKG is not ordered today.  Recent Labs: 03/26/2015: B Natriuretic Peptide 199.0* 05/04/2015: ALT 27 [id number]: Magnesium 1.8 10/14/2015: BUN 16; Creatinine, Ser 0.93; Hemoglobin 10.7*; Platelets 184; Potassium 3.9; Sodium 140   Lipid Panel     Component Value Date/Time   CHOL 104 07/20/2014 0453   TRIG 131 07/20/2014 0453   HDL 43 07/20/2014 0453   VLDL 26 07/20/2014 0453   LDLCALC 35 07/20/2014 0453      Wt Readings from Last 3 Encounters:  11/16/15 286 lb 1.9 oz (129.783 kg)  10/14/15 265 lb (120.203 kg)  10/02/15 275 lb (124.739 kg)     Cardiac Studies Reviewed: Chest x-ray 10/14/2015: FINDINGS: There is no focal parenchymal opacity. There is no pleural effusion or pneumothorax. The heart and mediastinal contours are unremarkable. There is evidence of prior CABG.  The osseous structures are unremarkable.  IMPRESSION: No active disease.  CT chest 09/19/2015: IMPRESSION: 1. There is linear scarring bilaterally as noted above. No  active infiltrate or effusion is seen and no suspicious lung nodule is noted. 2. Calcified granulomas bilaterally appear 3. 4.0 cm mid ascending thoracic aorta. Recommend annual imaging followup by CTA or MRA. This recommendation follows 2010 ACCF/AHA/AATS/ACR/ASA/SCA/SCAI/SIR/STS/SVM Guidelines for the Diagnosis and Management of Patients with Thoracic Aortic Disease. Circulation. 2010; 121: [id number] . 4. Slight cortical regularity of the anterolateral right seventh rib. Possible subacute or old fracture.  2-D echocardiogram 09/25/2015: ECHOCARDIOGRAPHIC MEASUREMENTS 2D DIMENSIONS AORTA Values Normal Range MAIN PA Values Normal Range Annulus: nm* [2.3 - 2.9] PA Main: nm* [1.5 - 2.1] Aorta Sin: nm* [3.1 - 3.7] RIGHT VENTRICLE ST Junction: nm* [2.6 - 3.2] RV Base: 5.2 cm [ < 4.2] Asc.Aorta: nm* [2.6 - 3.4] RV Mid: nm* [ < 3.5] LEFT VENTRICLE RV Length: nm* [ < 8.6] LVIDd: 6.8 cm [4.2 - 5.9] INFERIOR VENA CAVA LVIDs: 5.1 cm Max. IVC: nm* [ <= 2.1] FS: 25.4 % [> 25] Min. IVC: nm* SWT: 1.2 cm [0.6 - 1.0] ------------------ PWT: 1.2 cm [0.6 - 1.0] nm* - not measured LEFT ATRIUM LA Diam: 5.1 cm [3.0 - 4.0] LA A4C Area: nm* [ < 20] LA Volume: nm* [18 - 58]  ___________________________________________________________________________________________ ECHOCARDIOGRAPHIC DESCRIPTIONS  AORTIC ROOT Size:Normal Dissection:INDETERM FOR DISSECTION  AORTIC VALVE Leaflets:BICUSPID Morphology:MODERATELY THICKENED Mobility:PARTIALLY MOBILE  LEFT VENTRICLE Size:Normal Anterior:Normal Contraction:Normal Lateral:Normal Closest EF:>55% (Estimated) Septal:Normal LV Masses:No Masses Apical:Normal Keyes:[id number] LVH Inferior:Normal Posterior:Normal Dias.FxClass:(Grade 1) relaxation abnormal, E/A reversal  MITRAL VALVE Leaflets:Normal Mobility:Fully mobile Morphology:Normal  LEFT ATRIUM Size:MILDLY ENLARGED LA Masses:No masses IA Septum:Normal IAS  MAIN PA Size:Normal  PULMONIC  VALVE Morphology:Normal Mobility:Fully mobile  RIGHT VENTRICLE Size:MILDLY ENLARGED Free Wall:Normal Contraction:Normal RV Masses:No Masses  TRICUSPID VALVE Leaflets:Normal Mobility:Fully mobile Morphology:Normal  RIGHT ATRIUM Size:MILDLY ENLARGED RA Other:None RA Mass:No masses  PERICARDIUM Fluid:No effusion  INFERIOR VENACAVA Size:DILATED ABNORMAL RESPIRATORY COLLAPSE   _____________________________________________________________________ DOPPLER ECHO and OTHER SPECIAL PROCEDURES Aortic:MILD AR MILD AS 351.0 cm/sec peak vel 49.3 mmHg peak grad 25.3 mmHg mean grad 1.1 cm^2 by DOPPLER  Mitral:TRIVIAL MR No MS 3.3 cm^2 by DOPPLER MV Inflow E Vel=121.0 cm/sec MV Annulus E'Vel=6.8 cm/sec E/E'Ratio=17.8  Tricuspid:MILD TR No TS 327.0 cm/sec peak TR vel 52.8 mmHg peak RV pressure  Pulmonary:TRIVIAL PR No PS  Cardiac catheterization 10/02/2015: Procedures  Coronary/Grafts Angiography    Conclusion     Prox Cx lesion, 100% stenosed.  Prox LAD lesion, 100% stenosed.  Dist RCA lesion, 100% stenosed.  SVG was injected is normal in caliber.  The graft exhibits minimal luminal irregularities.  was injected is normal in caliber.  The graft exhibits minimal luminal irregularities.  SVG was injected is normal in caliber, large.  The graft exhibits minimal luminal irregularities.  LIMA was injected is normal in caliber.  The graft exhibits minimal luminal irregularities.  Mid Graft lesion, 10% stenosed. The lesion was previously treated with a stent (unknown type).  Prox Graft lesion, 30% stenosed.  Three vessel native disease with patent svg to d1, om1 and rpda. Patent lima to lad. Aortic valve bicuspid and moderately stenoses by echo. Not crossed. . WIll reevaluate aortic valve and consider aortic valve intervention if found to be severely stenosed.     STS Risk Calculator: STS-PROM 6.8%  ASSESSMENT AND PLAN: 60 yo male with multiple medical  problems who has Acute on Chronic Diastolic CHF, NYHA III with moderate-severe likely bicuspid aortic valve stenosis, Stage D.   I have personally reviewed his cardiac cath films, echo images, and extensive data through [facility] including labs, radiographic studies, and office notes. Serial echo studies show progression of aortic valve gradients over the past year, and the patient's symptoms have progressed significantly over just a few months. Despite that, the valve appears to open moderately well on 2D imaging and mean transvalvular gradients measure less than 30 mmHg.  I have reviewed the natural history of aortic stenosis with the patient today. We have discussed the limitations of medical therapy and the poor prognosis associated with symptomatic aortic stenosis. We have reviewed potential treatment options, including palliative medical therapy, conventional surgical aortic valve replacement, and transcatheter aortic valve replacement. We discussed treatment options in the context of this patient's specific comorbid medical conditions.   Because of poor acoustic windows on surface echo imaging likely related to this patient's body habitus, as well as uncertainty of the impact of aortic stenosis on his symptoms of heart failure, I think further studies are indicated. The patient will undergo right and left heart catheterization for hemodynamic assessment and TEE imaging with anesthesia support considering his tenuous respiratory status and morbid obesity. I have reviewed the risks and indications of both procedures with the patient and he understands and agrees to proceed. If he ultimately requires aortic valve replacement, TAVR would be a reasonable treatment option as long as his valve is anatomically suitable. If further studies indicate severe aortic stenosis, will arrange CT angiography of the heart and chest/abdomen/pelvis as well as formal cardiac surgical evaluation. Case discussed with Dr  Ubaldo Glassing.  Current medicines are reviewed with the patient today.  The patient does not have concerns regarding medicines.  Labs/ tests ordered today include:  Orders Placed This Encounter  Procedures  . Basic Metabolic Panel (BMET)  . CBC  . INR/PT   Disposition:   Hemodynamic right and left heart cath and TEE to be scheduled.   Deatra James, MD  11/16/2015 2:27 PM    [facility] [address], [place], [place]  [address] Phone: [phone]; Fax: [phone]

## 2015-11-21 NOTE — Interval H&P Note (Signed)
History and Physical Interval Note:  11/21/2015 10:01 AM  Marcus Williams  has presented today for surgery, with the diagnosis of sob  The various methods of treatment have been discussed with the patient and family. After consideration of risks, benefits and other options for treatment, the patient has consented to  Procedure(s): Right/Left Heart Cath and Coronary Angiography (N/A) as a surgical intervention .  The patient's history has been reviewed, patient examined, no change in status, stable for surgery.  I have reviewed the patient's chart and labs.  Questions were answered to the patient's satisfaction.     Sherren Mocha

## 2015-11-21 NOTE — Anesthesia Preprocedure Evaluation (Addendum)
Anesthesia Evaluation  Patient identified by MRN, date of birth, ID band Patient awake    Reviewed: Allergy & Precautions, NPO status , Patient's Chart, lab work & pertinent test results, reviewed documented beta blocker date and time   Airway Mallampati: III  TM Distance: >3 FB Neck ROM: Full    Dental  (+) Teeth Intact, Dental Advisory Given, Missing   Pulmonary shortness of breath, sleep apnea and Continuous Positive Airway Pressure Ventilation ,    Pulmonary exam normal breath sounds clear to auscultation       Cardiovascular hypertension, Pt. on home beta blockers and Pt. on medications + CAD, + Past MI, + CABG and +CHF  Normal cardiovascular exam+ dysrhythmias Atrial Fibrillation and Supra Ventricular Tachycardia + Valvular Problems/Murmurs AS  Rhythm:Regular Rate:Normal     Neuro/Psych negative neurological ROS  negative psych ROS   GI/Hepatic Neg liver ROS, GERD  Medicated,  Endo/Other  diabetes, Type 2, Insulin Dependent, Oral Hypoglycemic AgentsMorbid obesity  Renal/GU negative Renal ROS     Musculoskeletal negative musculoskeletal ROS (+)   Abdominal   Peds  Hematology  (+) Blood dyscrasia, anemia ,   Anesthesia Other Findings Day of surgery medications reviewed with the patient.  Reproductive/Obstetrics                            Anesthesia Physical Anesthesia Plan  ASA: IV  Anesthesia Plan: MAC   Post-op Pain Management:    Induction: Intravenous  Airway Management Planned: Nasal Cannula  Additional Equipment:   Intra-op Plan:   Post-operative Plan:   Informed Consent: I have reviewed the patients History and Physical, chart, labs and discussed the procedure including the risks, benefits and alternatives for the proposed anesthesia with the patient or authorized representative who has indicated his/her understanding and acceptance.   Dental advisory given  Plan  Discussed with: CRNA and Anesthesiologist  Anesthesia Plan Comments: (Discussed risks/benefits/alternatives to MAC sedation including need for ventilatory support, hypotension, need for conversion to general anesthesia.  All patient questions answered.  Patient/guardian wishes to proceed.)       Anesthesia Quick Evaluation

## 2015-11-21 NOTE — Progress Notes (Signed)
No need for aspirin today per Juliette Mangle

## 2015-11-21 NOTE — Progress Notes (Signed)
Pt to Endo for procedure.

## 2015-11-21 NOTE — CV Procedure (Addendum)
Procedure: TEE  Indication: Aortic stenosis.  Sedation: Per anesthesiology.  Findings: Please see echo report for full details.  Normal LV size with mild-moderate LV hypertrophy.   EF 55%, no regional wall motion abnormalities.  Normal RV size and systolic function.  Normal atrial sizes.  Trivial TR.  Trivial MR.  The aortic valve was functionally bicuspid with fusion of raphus.  Mild aortic insufficiency.  AVA by planimetry 1.7 cm^2.  Mean gradient 20 mmHg with AVA by VTI 1.48 cm^2 => mild to moderate aortic stenosis.  No ASD/PFO (negative bubble study).  Mildly dilated ascending aorta (4.1 cm).    Impression: Mild to moderate aortic stenosis with functionally bicuspid aortic valve and mildly dilated ascending aorta.   Marcus Williams 11/21/2015 12:34 PM

## 2015-11-21 NOTE — Interval H&P Note (Signed)
History and Physical Interval Note:  11/21/2015 12:13 PM  Marcus Williams  has presented today for surgery, with the diagnosis of aortic stensosi and shortness of breath  The various methods of treatment have been discussed with the patient and family. After consideration of risks, benefits and other options for treatment, the patient has consented to  Procedure(s): TRANSESOPHAGEAL ECHOCARDIOGRAM (TEE) (N/A) as a surgical intervention .  The patient's history has been reviewed, patient examined, no change in status, stable for surgery.  I have reviewed the patient's chart and labs.  Questions were answered to the patient's satisfaction.     Dalton [employer]

## 2015-11-21 NOTE — Anesthesia Procedure Notes (Signed)
Procedure Name: MAC Date/Time: 11/21/2015 12:03 PM Performed by: Salli Quarry Ladesha Pacini Pre-anesthesia Checklist: Patient identified, Emergency Drugs available, Suction available, Patient being monitored and Timeout performed Patient Re-evaluated:Patient Re-evaluated prior to inductionOxygen Delivery Method: Nasal cannula

## 2015-11-21 NOTE — Anesthesia Postprocedure Evaluation (Signed)
Anesthesia Post Note  Patient: Marcus Williams  Procedure(s) Performed: Procedure(s) (LRB): TRANSESOPHAGEAL ECHOCARDIOGRAM (TEE) (N/A)  Patient location during evaluation: Endoscopy Anesthesia Type: MAC Level of consciousness: awake and alert Pain management: pain level controlled Vital Signs Assessment: post-procedure vital signs reviewed and stable Respiratory status: spontaneous breathing, nonlabored ventilation, respiratory function stable and patient connected to nasal cannula oxygen Cardiovascular status: stable and blood pressure returned to baseline Anesthetic complications: no    Last Vitals:  Filed Vitals:   11/21/15 1237 11/21/15 1240  BP: 119/38   Pulse: 45 64  Temp:    Resp: 24 26    Last Pain:  Filed Vitals:   11/21/15 1240  PainSc: Asleep                 Catalina Gravel

## 2015-11-21 NOTE — Progress Notes (Signed)
Patient arrived to 2W in no apparent distress via wheelchair.  Patient was oriented to room to include phone and call light.  Telemetry monitor was applied and CCMD was notified.  Will continue to monitor.

## 2015-11-21 NOTE — H&P (Signed)
Expand All Collapse All      Cardiology Office Note Date: 11/16/2015   ID: Cortavius, Barrentine 10/05/1955, MRN [id number]  PCP: Idelle Crouch, MD Cardiologist: Bartholome Bill, MD  Chief Complaint  Patient presents with  . New Patient (Initial Visit)    TAVR consult. Has shortness of breath. Denies chest pain, LEE, or claudication   History of Present Illness: Marcus Williams is a 61 y.o. male who presents for evaluation of aortic stenosis. The patient has an extensive medical history and has been followed closely by Dr Doy Hutching for general medical care and Dr Ubaldo Glassing for cardiac problems. He underwent multivessel CABG at age 33. He's had long-standing obesity. The patient worked until about 4 years ago. He is disabled from cardiac and medical problems. He has had long-standing obstructive sleep apnea and has been on CPAP for 10 years. He has paroxysmal atrial fibrillation and has been maintained on long-term anticoagulation. He denies any history of bleeding problems. He has long-standing diabetes and has been insulin-dependent for approximately 10 years. The patient has been hospitalized for pneumonia about 2 years ago by his report. 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  He states that over the last 3 months he has developed progressive shortness of breath with activity. He's been on home oxygen now for the last 3 weeks. He has undergone extensive evaluation with echocardiography, chest CT, and cardiac catheterization. On his most recent echocardiogram he was found to have progressive aortic stenosis and is referred for further evaluation.  The patient admits to worsening dyspnea. He now is short of breath with very low-level activity. He also complains of swelling and orthopnea. Complains of cough but no fever or chills. He denies chest pain or pressure. He denies lightheadedness or syncope.  The patient's most  recent echocardiogram is reviewed and shows a peak transaortic velocity of 350 cm/s, peak and mean transaortic valve gradients of 49 and 25 mmHg, respectively, and a calculated aortic valve area of 1.1 cm. Cardiac catheterization films are reviewed and shows continued patency of all of the patient's bypass grafts with severe native coronary disease noted.  Past Medical History  Diagnosis Date  . MI (myocardial infarction) ([place])   . Sepsis ([place])   . History of cardioversion   . Diabetes mellitus without complication ([place])   . Hypertension   . CAD (coronary artery disease)   . SVT (supraventricular tachycardia) ([place])   . OSA on CPAP   . A-fib ([place])   . Chronic systolic CHF (congestive heart failure) [facility])     Past Surgical History  Procedure Laterality Date  . Quadruple bypass    . Knee surgery    . Bunionectomy    . Coronary artery bypass graft    . Amputation toe Right 05/06/2015    Procedure: AMPUTATION TOE; Surgeon: Sharlotte Alamo, MD; Location: [facility] ORS; Service: Podiatry; Laterality: Right;  . Cardiac catheterization N/A 10/02/2015    Procedure: Coronary/Grafts Angiography; Surgeon: Teodoro Spray, MD; Location: [facility]; Service: Cardiovascular; Laterality: N/A;    Current Outpatient Prescriptions  Medication Sig Dispense Refill  . albuterol (PROVENTIL HFA;VENTOLIN HFA) 108 (90 Base) MCG/ACT inhaler Inhale 2 puffs into the lungs every 6 (six) hours as needed for wheezing or shortness of breath. 1 Inhaler 10  . ALPRAZolam (XANAX) 0.25 MG tablet Take 0.25 mg by mouth 2 (two) times daily as needed for anxiety.    Marland Kitchen  aspirin EC 325 MG tablet Take 325 mg by mouth at bedtime.     Marland Kitchen diltiazem (CARDIZEM CD) 300 MG 24 hr capsule Take 1 capsule (300 mg total) by mouth daily. 30 capsule 5  . fluticasone (FLONASE) 50 MCG/ACT nasal spray Place 1 spray into both nostrils daily  as needed for allergies.     . Fluticasone-Salmeterol (ADVAIR DISKUS) 250-50 MCG/DOSE AEPB Inhale 1 puff into the lungs 2 (two) times daily as needed (For shortness of breath.).     Marland Kitchen furosemide (LASIX) 40 MG tablet Take 1 tablet (40 mg total) by mouth 2 (two) times daily. 60 tablet 5  . glimepiride (AMARYL) 4 MG tablet Take 4 mg by mouth daily.    . insulin NPH Human (HUMULIN N,NOVOLIN N) 100 UNIT/ML injection Inject 60 Units into the skin 2 (two) times daily.     Marland Kitchen ipratropium-albuterol (DUONEB) 0.5-2.5 (3) MG/3ML SOLN Take 3 mLs by nebulization every 4 (four) hours as needed (for shortness of breath).    . meloxicam (MOBIC) 15 MG tablet Take 15 mg by mouth daily as needed for pain.     . metFORMIN (GLUCOPHAGE) 1000 MG tablet Take 1,000 mg by mouth 2 (two) times daily with a meal.    . metoprolol tartrate (LOPRESSOR) 25 MG tablet Take 1 tablet (25 mg total) by mouth 2 (two) times daily. 60 tablet 5  . nitroGLYCERIN (NITROSTAT) 0.4 MG SL tablet Place 0.4 mg under the tongue every 5 (five) minutes as needed for chest pain.    . pantoprazole (PROTONIX) 40 MG tablet Take 40 mg by mouth daily.    . Rivaroxaban (XARELTO) 15 MG TABS tablet Take 15 mg by mouth daily with breakfast.    . simvastatin (ZOCOR) 40 MG tablet Take 40 mg by mouth at bedtime.     . traZODone (DESYREL) 100 MG tablet Take 100 mg by mouth at bedtime.    . metoprolol (LOPRESSOR) 100 MG tablet Take 100 mg by mouth 2 (two) times daily.     No current facility-administered medications for this visit.    Allergies: Review of patient's allergies indicates no known allergies.   Social History: The patient  reports that he has never smoked. He does not have any smokeless tobacco history on file. He reports that he does not drink alcohol or use illicit drugs.   Family History: The patient's family history includes Diabetes in his brother and brother; Heart  attack in his brother, father, and mother; Heart disease in his brother; Hypertension in his brother, father, and mother; Lung disease in his brother; Stroke in his mother.    ROS: Please see the history of present illness. Otherwise, review of systems is positive for orthopnea. All other systems are reviewed and negative.    PHYSICAL EXAM: VS: BP 148/52 mmHg  Pulse 64  Ht 5\' 9"  (1.753 m)  Wt 286 lb 1.9 oz (129.783 kg)  BMI 42.23 kg/m2 , BMI Body mass index is 42.23 kg/(m^2). GEN: Well nourished, well developed, obese male, chronically ill-appearing, in no acute distress  HEENT: normal  Neck: JVP elevated in the sitting position, no masses. Bilateral carotid bruits Cardiac: RRR with 3/6 systolic murmur best heard at the apex, crescendo/decrescendo pattern  Respiratory: clear to auscultation bilaterally, normal work of breathing GI: soft, nontender, nondistended, + BS MS: no deformity or atrophy  Ext: 1+ edema on the left, trace edema on the right, chronic stasis changes, small ulcerations on the anterior aspect of the left lower leg.  Neuro: Strength and sensation are intact Psych: euthymic mood, full affect  EKG: EKG is not ordered today.  Recent Labs: 03/26/2015: B Natriuretic Peptide 199.0* 05/04/2015: ALT 27 112-Feb-202016: Magnesium 1.8 10/14/2015: BUN 16; Creatinine, Ser 0.93; Hemoglobin 10.7*; Platelets 184; Potassium 3.9; Sodium 140   Lipid Panel   Labs (Brief)       Component Value Date/Time   CHOL 104 07/20/2014 0453   TRIG 131 07/20/2014 0453   HDL 43 07/20/2014 0453   VLDL 26 07/20/2014 0453   LDLCALC 35 07/20/2014 0453       Wt Readings from Last 3 Encounters:  11/16/15 286 lb 1.9 oz (129.783 kg)  10/14/15 265 lb (120.203 kg)  10/02/15 275 lb (124.739 kg)     Cardiac Studies Reviewed: Chest x-ray 10/14/2015: FINDINGS: There is no focal parenchymal opacity. There is no pleural effusion or  pneumothorax. The heart and mediastinal contours are unremarkable. There is evidence of prior CABG.  The osseous structures are unremarkable.  IMPRESSION: No active disease.  CT chest 09/19/2015: IMPRESSION: 1. There is linear scarring bilaterally as noted above. No active infiltrate or effusion is seen and no suspicious lung nodule is noted. 2. Calcified granulomas bilaterally appear 3. 4.0 cm mid ascending thoracic aorta. Recommend annual imaging followup by CTA or MRA. This recommendation follows 2010 ACCF/AHA/AATS/ACR/ASA/SCA/SCAI/SIR/STS/SVM Guidelines for the Diagnosis and Management of Patients with Thoracic Aortic Disease. Circulation. 2010; 121: [id number] . 4. Slight cortical regularity of the anterolateral right seventh rib. Possible subacute or old fracture.  2-D echocardiogram 09/25/2015: ECHOCARDIOGRAPHIC MEASUREMENTS 2D DIMENSIONS AORTA Values Normal Range MAIN PA Values Normal Range Annulus: nm* [2.3 - 2.9] PA Main: nm* [1.5 - 2.1] Aorta Sin: nm* [3.1 - 3.7] RIGHT VENTRICLE ST Junction: nm* [2.6 - 3.2] RV Base: 5.2 cm [ < 4.2] Asc.Aorta: nm* [2.6 - 3.4] RV Mid: nm* [ < 3.5] LEFT VENTRICLE RV Length: nm* [ < 8.6] LVIDd: 6.8 cm [4.2 - 5.9] INFERIOR VENA CAVA LVIDs: 5.1 cm Max. IVC: nm* [ <= 2.1] FS: 25.4 % [> 25] Min. IVC: nm* SWT: 1.2 cm [0.6 - 1.0] ------------------ PWT: 1.2 cm [0.6 - 1.0] nm* - not measured LEFT ATRIUM LA Diam: 5.1 cm [3.0 - 4.0] LA A4C Area: nm* [ < 20] LA Volume: nm* [18 - 58]  ___________________________________________________________________________________________ ECHOCARDIOGRAPHIC DESCRIPTIONS  AORTIC ROOT Size:Normal Dissection:INDETERM FOR DISSECTION  AORTIC VALVE Leaflets:BICUSPID Morphology:MODERATELY THICKENED Mobility:PARTIALLY MOBILE  LEFT VENTRICLE Size:Normal Anterior:Normal Contraction:Normal Lateral:Normal Closest EF:>55% (Estimated) Septal:Normal LV Masses:No Masses Apical:Normal Hoyt:[id number] LVH  Inferior:Normal Posterior:Normal Dias.FxClass:(Grade 1) relaxation abnormal, E/A reversal  MITRAL VALVE Leaflets:Normal Mobility:Fully mobile Morphology:Normal  LEFT ATRIUM Size:MILDLY ENLARGED LA Masses:No masses IA Septum:Normal IAS  MAIN PA Size:Normal  PULMONIC VALVE Morphology:Normal Mobility:Fully mobile  RIGHT VENTRICLE Size:MILDLY ENLARGED Free Wall:Normal Contraction:Normal RV Masses:No Masses  TRICUSPID VALVE Leaflets:Normal Mobility:Fully mobile Morphology:Normal  RIGHT ATRIUM Size:MILDLY ENLARGED RA Other:None RA Mass:No masses  PERICARDIUM Fluid:No effusion  INFERIOR VENACAVA Size:DILATED ABNORMAL RESPIRATORY COLLAPSE   _____________________________________________________________________ DOPPLER ECHO and OTHER SPECIAL PROCEDURES Aortic:MILD AR MILD AS 351.0 cm/sec peak vel 49.3 mmHg peak grad 25.3 mmHg mean grad 1.1 cm^2 by DOPPLER  Mitral:TRIVIAL MR No MS 3.3 cm^2 by DOPPLER MV Inflow E Vel=121.0 cm/sec MV Annulus E'Vel=6.8 cm/sec E/E'Ratio=17.8  Tricuspid:MILD TR No TS 327.0 cm/sec peak TR vel 52.8 mmHg peak RV pressure  Pulmonary:TRIVIAL PR No PS  Cardiac catheterization 10/02/2015: Procedures    Coronary/Grafts Angiography    Conclusion     Prox Cx lesion, 100% stenosed.  Prox LAD lesion, 100%  stenosed.  Dist RCA lesion, 100% stenosed.  SVG was injected is normal in caliber.  The graft exhibits minimal luminal irregularities.  was injected is normal in caliber.  The graft exhibits minimal luminal irregularities.  SVG was injected is normal in caliber, large.  The graft exhibits minimal luminal irregularities.  LIMA was injected is normal in caliber.  The graft exhibits minimal luminal irregularities.  Mid Graft lesion, 10% stenosed. The lesion was previously treated with a stent (unknown type).  Prox Graft lesion, 30% stenosed.  Three vessel native disease with patent svg to d1, om1 and rpda. Patent lima  to lad. Aortic valve bicuspid and moderately stenoses by echo. Not crossed. . WIll reevaluate aortic valve and consider aortic valve intervention if found to be severely stenosed.     STS Risk Calculator: STS-PROM 6.8%  ASSESSMENT AND PLAN: 60 yo male with multiple medical problems who has Acute on Chronic Diastolic CHF, NYHA III with moderate-severe likely bicuspid aortic valve stenosis, Stage D.   I have personally reviewed his cardiac cath films, echo images, and extensive data through [place] including labs, radiographic studies, and office notes. Serial echo studies show progression of aortic valve gradients over the past year, and the patient's symptoms have progressed significantly over just a few months. Despite that, the valve appears to open moderately well on 2D imaging and mean transvalvular gradients measure less than 30 mmHg. I have reviewed the natural history of aortic stenosis with the patient today. We have discussed the limitations of medical therapy and the poor prognosis associated with symptomatic aortic stenosis. We have reviewed potential treatment options, including palliative medical therapy, conventional surgical aortic valve replacement, and transcatheter aortic valve replacement. We discussed treatment options in the context of this patient's specific comorbid medical conditions.   Because of poor acoustic windows on surface echo imaging likely related to this patient's body habitus, as well as uncertainty of the impact of aortic stenosis on his symptoms of heart failure, I think further studies are indicated. The patient will undergo right and left heart catheterization for hemodynamic assessment and TEE imaging with anesthesia support considering his tenuous respiratory status and morbid obesity. I have reviewed the risks and indications of both procedures with the patient and he understands and agrees to proceed. If he ultimately requires aortic valve  replacement, TAVR would be a reasonable treatment option as long as his valve is anatomically suitable. If further studies indicate severe aortic stenosis, will arrange CT angiography of the heart and chest/abdomen/pelvis as well as formal cardiac surgical evaluation. Case discussed with Dr Ubaldo Glassing.  Current medicines are reviewed with the patient today. The patient does not have concerns regarding medicines.  Labs/ tests ordered today include:  Orders Placed This Encounter  Procedures  . Basic Metabolic Panel (BMET)  . CBC  . INR/PT   Disposition: Hemodynamic right and left heart cath and TEE to be scheduled.   Deatra James, MD  11/16/2015 2:27 PM  [facility] [place], [place], [place] [address] Phone: [phone]; Fax: [phone]      ADDENDUM 11/21/2015: The patient has undergone hemodynamic right and left heart catheterization. This demonstrates markedly elevated filling pressures both in the right and left heart. Discussed findings with patient and reviewed treatment strategies. Will admit him for treatment of acute on chronic diastolic heart failure. He will be treated with IV diuretics in order to manage his volume overload.  Sherren Mocha 11/21/2015 12:06 PM

## 2015-11-22 ENCOUNTER — Encounter (HOSPITAL_COMMUNITY): Payer: Self-pay | Admitting: Cardiology

## 2015-11-22 DIAGNOSIS — I5033 Acute on chronic diastolic (congestive) heart failure: Principal | ICD-10-CM

## 2015-11-22 LAB — IRON AND TIBC
IRON: 32 ug/dL — AB (ref 45–182)
Iron: 26 ug/dL — ABNORMAL LOW (ref 45–182)
Saturation Ratios: 6 % — ABNORMAL LOW (ref 17.9–39.5)
Saturation Ratios: 8 % — ABNORMAL LOW (ref 17.9–39.5)
TIBC: 400 ug/dL (ref 250–450)
TIBC: 421 ug/dL (ref 250–450)
UIBC: 374 ug/dL
UIBC: 389 ug/dL

## 2015-11-22 LAB — BASIC METABOLIC PANEL
ANION GAP: 8 (ref 5–15)
BUN: 16 mg/dL (ref 6–20)
CO2: 28 mmol/L (ref 22–32)
Calcium: 8.7 mg/dL — ABNORMAL LOW (ref 8.9–10.3)
Chloride: 102 mmol/L (ref 101–111)
Creatinine, Ser: 1.11 mg/dL (ref 0.61–1.24)
GFR calc Af Amer: >60 mL/min (ref >60)
GFR calc non Af Amer: >60 mL/min (ref >60)
GLUCOSE: 280 mg/dL — AB (ref 65–99)
POTASSIUM: 3.4 mmol/L — AB (ref 3.5–5.1)
Sodium: 138 mmol/L (ref 135–145)

## 2015-11-22 LAB — GLUCOSE, CAPILLARY
GLUCOSE-CAPILLARY: 236 mg/dL — AB (ref 65–99)
GLUCOSE-CAPILLARY: 181 mg/dL — AB (ref 65–99)
GLUCOSE-CAPILLARY: 139 mg/dL — AB (ref 65–99)
Glucose-Capillary: 281 mg/dL — ABNORMAL HIGH (ref 65–99)

## 2015-11-22 LAB — CBC
HEMATOCRIT: 29.2 % — AB (ref 39.0–52.0)
HEMOGLOBIN: 8.3 g/dL — AB (ref 13.0–17.0)
MCH: 21 pg — AB (ref 26.0–34.0)
MCHC: 28.4 g/dL — AB (ref 30.0–36.0)
MCV: 73.9 fL — ABNORMAL LOW (ref 78.0–100.0)
Platelets: 166 10*3/uL (ref 150–400)
RBC: 3.95 MIL/uL — ABNORMAL LOW (ref 4.22–5.81)
RDW: 17.6 % — ABNORMAL HIGH (ref 11.5–15.5)
WBC: 7.7 10*3/uL (ref 4.0–10.5)

## 2015-11-22 LAB — VITAMIN B12: Vitamin B-12: 219 pg/mL (ref 180–914)

## 2015-11-22 LAB — FERRITIN: FERRITIN: 20 ng/mL — AB (ref 24–336)

## 2015-11-22 MED ORDER — POTASSIUM CHLORIDE CRYS ER 20 MEQ PO TBCR
40.0000 meq | EXTENDED_RELEASE_TABLET | Freq: Once | ORAL | Status: AC
  Administered 2015-11-22: 40 meq via ORAL
  Filled 2015-11-22: qty 2

## 2015-11-22 MED ORDER — RIVAROXABAN 20 MG PO TABS
20.0000 mg | ORAL_TABLET | Freq: Every day | ORAL | Status: DC
  Administered 2015-11-22 – 2015-11-23 (×2): 20 mg via ORAL
  Filled 2015-11-22 (×2): qty 1

## 2015-11-22 NOTE — Progress Notes (Signed)
Pt sitting up in chair, no needs at this time. Call light within reach.

## 2015-11-22 NOTE — Progress Notes (Signed)
Patient ID: Marcus Williams, male   DOB: 14-Aug-1955, 60 y.o.   MRN: [id number]     SUBJECTIVE: Patient feels less short of breath with walking today.  Weight is down.  Hemoglobin lower but no overt bleeding.   Scheduled Meds: . diltiazem  300 mg Oral Daily  . furosemide  80 mg Intravenous BID  . glimepiride  4 mg Oral QAC breakfast  . insulin aspart  0-15 Units Subcutaneous TID WC  . insulin NPH Human  60 Units Subcutaneous QAC breakfast  . insulin NPH Human  60 Units Subcutaneous QHS  . metoprolol  100 mg Oral BID  . pantoprazole  40 mg Oral Daily  . Rivaroxaban  15 mg Oral Q breakfast  . simvastatin  40 mg Oral QHS  . sodium chloride flush  3 mL Intravenous Q12H  . traZODone  100 mg Oral QHS   Continuous Infusions:  PRN Meds:.sodium chloride, acetaminophen, albuterol, ALPRAZolam, fentaNYL (SUBLIMAZE) injection, fluticasone, ipratropium-albuterol, nitroGLYCERIN, ondansetron (ZOFRAN) IV, ondansetron (ZOFRAN) IV, sodium chloride flush    Filed Vitals:   11/21/15 1540 11/21/15 1642 11/21/15 2145 11/22/15 0432  BP: 138/46 139/47 150/55 139/55  Pulse: 51 53 58 56  Temp:  98.1 F (36.7 C) 98 F (36.7 C) 98.3 F (36.8 C)  TempSrc:  Oral Oral Oral  Resp: 20 20 20 20   Height:      Weight:    274 lb (124.286 kg)  SpO2: 97% 93% 97% 94%    Intake/Output Summary (Last 24 hours) at 11/22/15 0740 Last data filed at 11/21/15 1700  Gross per 24 hour  Intake    580 ml  Output   1700 ml  Net  -1120 ml    LABS: Basic Metabolic Panel:  Recent Labs  11/22/15 0300  NA 138  K 3.4*  CL 102  CO2 28  GLUCOSE 280*  BUN 16  CREATININE 1.11  CALCIUM 8.7*   Liver Function Tests: No results for input(s): AST, ALT, ALKPHOS, BILITOT, PROT, ALBUMIN in the last 72 hours. No results for input(s): LIPASE, AMYLASE in the last 72 hours. CBC:  Recent Labs  11/22/15 0300  WBC 7.7  HGB 8.3*  HCT 29.2*  MCV 73.9*  PLT 166   Cardiac Enzymes: No results for input(s): CKTOTAL, CKMB,  CKMBINDEX, TROPONINI in the last 72 hours. BNP: Invalid input(s): POCBNP D-Dimer: No results for input(s): DDIMER in the last 72 hours. Hemoglobin A1C: No results for input(s): HGBA1C in the last 72 hours. Fasting Lipid Panel: No results for input(s): CHOL, HDL, LDLCALC, TRIG, CHOLHDL, LDLDIRECT in the last 72 hours. Thyroid Function Tests: No results for input(s): TSH, T4TOTAL, T3FREE, THYROIDAB in the last 72 hours.  Invalid input(s): FREET3 Anemia Panel:  Recent Labs  11/22/15 0300  TIBC 400  IRON 26*    RADIOLOGY: No results found.  PHYSICAL EXAM General: NAD Neck: Thick, JVP 10 cm, no thyromegaly or thyroid nodule.  Lungs: Clear to auscultation bilaterally with normal respiratory effort. CV: Nondisplaced PMI.  Heart regular S1/S2, no S3/S4, 3/6 crescendo-decrescendo systolic murmur RUSB with clear S2.  1+ ankle edema.   Abdomen: Soft, nontender, no hepatosplenomegaly, no distention.  Neurologic: Alert and oriented x 3.  Psych: Normal affect. Extremities: No clubbing or cyanosis.   TELEMETRY: Reviewed telemetry pt in NSR  ASSESSMENT AND PLAN: 60 yo with CAD s/p CABG and paroxysmal atrial fibrillation was admitted for evaluation of aortic stenosis.  1. Aortic stenosis: This appeared moderate by TEE and hemodynamic cath on 6/7.  He does have a bicuspid valve.  Not significant enough disease for valve replacement at this point.  2. Acute on chronic diastolic CHF: EF [id number] on TEE with normal-appearing RV.  He was significantly volume overloaded on RHC yesterday and looks volume overloaded still on exam.  Breathing better with diuresis.  - Continue Lasix 80 mg IV bid today. Possibly ready for discharge tomorrow.  3. Anemia: Hemoglobin has been falling recently.  Had c-scope within the last year, per report was ok.  No overt bleeding (melena, hematochezia). - Send iron studies, B12, folate. - Guaiac stool.  - Repeat CBC in am.  4. Atrial fibrillation: Paroxysmal.  He is in  NSR.  Xarelto dosing should be 20 mg daily.  Continue as long as no overt bleeding.   Loralie Champagne 11/22/2015 8:51 AM

## 2015-11-22 NOTE — Care Management Note (Signed)
Case Management Note Marvetta Gibbons RN, BSN Unit 2W-Case Manager [phone]  Patient Details  Name: Marcus Williams MRN: [id number] Date of Birth: March 24, 1956  Subjective/Objective:  Pt admitted with aortic stenosis and HF                  Action/Plan: PTA pt lived at home with spouse- plan to return home when medically stable- referral received for unable to afford meds and HF needs- in to speak with pt at bedside- confirmed with pt that he does have [facility] Medicare- pt states that some of his copays are high like Xarelto and some of his inhalers. He also reports that he was paying more for his insulin before getting it now from [place] uses [employer] for his other meds. Reviewed pt's medication list with him- pt is on some meds that are costly drugs unfortunately pt has medicare drug plan and does not qualify for any coupon savings card for any of the medications. Pt voices knowledge of this and understands says "my pharmacist has been good to help me find cheaper alternatives" Pt has home 02 with [facility] currently although he states he feels like he may not need it long term. Discussed weighing daily at home which pt reports he recently started doing- pt states that he feels like his cardiologist and PCP can manage him as an outpt- pt does not feel like he needs any HH f/u for HF management at this time. No further CM needs noted.   Expected Discharge Date:     11/23/15             Expected Discharge Plan:  Home/Self Care  In-House Referral:     Discharge planning Services  CM Consult  Post Acute Care Choice:    Choice offered to:     DME Arranged:    DME Agency:     HH Arranged:    HH Agency:     Status of Service:  Completed, signed off  Medicare Important Message Given:    Date Medicare IM Given:    Medicare IM give by:    Date Additional Medicare IM Given:    Additional Medicare Important Message give by:     If discussed at [place] of Stay Meetings, dates  discussed:    Additional Comments:  Dawayne Patricia, RN 11/22/2015, 2:40 PM

## 2015-11-22 NOTE — Progress Notes (Signed)
Inpatient Diabetes Program Recommendations  AACE/ADA: New Consensus Statement on Inpatient Glycemic Control (2015)  Target Ranges:  Prepandial:   less than 140 mg/dL      Peak postprandial:   less than 180 mg/dL (1-2 hours)      Critically ill patients:  140 - 180 mg/dL   Results for Marcus Williams, Marcus Williams (MRN [id number]) as of 11/22/2015 13:46  Ref. Range 11/21/2015 08:32 11/21/2015 16:35 11/21/2015 21:46 11/22/2015 06:18 11/22/2015 11:18  Glucose-Capillary Latest Ref Range: 65-99 mg/dL 232 (H) 155 (H) 184 (H) 236 (H) 281 (H)    Review of Glycemic Control  Diabetes history: DM 2 Outpatient Diabetes medications: Amaryl 4 mg qd + NPH 60 units bid Current orders for Inpatient glycemic control: Amaryl 4 mg qd + NPH 60 units bid + Novolog correction 0-15 tid with meals  Inpatient Diabetes Program Recommendations:  Noted elevated post prandial CBGs. Please consider adding Novolog 3-4 units meal coverage tid and A1c to evaluate prehospital glycemic control.  Thank you, [name]. [name], RN, MSN, CDE Inpatient Glycemic Control Team Team Pager [phone] (8am-5pm) 11/22/2015 1:51 PM

## 2015-11-22 NOTE — Progress Notes (Signed)
Utilization review completed.  

## 2015-11-23 DIAGNOSIS — K921 Melena: Secondary | ICD-10-CM

## 2015-11-23 LAB — CBC
HEMATOCRIT: 30.7 % — AB (ref 39.0–52.0)
HEMOGLOBIN: 8.9 g/dL — AB (ref 13.0–17.0)
MCH: 21.2 pg — AB (ref 26.0–34.0)
MCHC: 29 g/dL — AB (ref 30.0–36.0)
MCV: 73.3 fL — ABNORMAL LOW (ref 78.0–100.0)
Platelets: 182 10*3/uL (ref 150–400)
RBC: 4.19 MIL/uL — ABNORMAL LOW (ref 4.22–5.81)
RDW: 17.6 % — AB (ref 11.5–15.5)
WBC: 7.2 10*3/uL (ref 4.0–10.5)

## 2015-11-23 LAB — FOLATE RBC
Folate, Hemolysate: 485.3 ng/mL
Folate, RBC: 1475 ng/mL (ref >498)
HEMATOCRIT: 32.9 % — AB (ref 37.5–51.0)

## 2015-11-23 LAB — BASIC METABOLIC PANEL
ANION GAP: 9 (ref 5–15)
BUN: 16 mg/dL (ref 6–20)
CALCIUM: 8.7 mg/dL — AB (ref 8.9–10.3)
CO2: 27 mmol/L (ref 22–32)
Chloride: 102 mmol/L (ref 101–111)
Creatinine, Ser: 1.06 mg/dL (ref 0.61–1.24)
GFR calc Af Amer: >60 mL/min (ref >60)
GFR calc non Af Amer: >60 mL/min (ref >60)
GLUCOSE: 179 mg/dL — AB (ref 65–99)
Potassium: 3.5 mmol/L (ref 3.5–5.1)
Sodium: 138 mmol/L (ref 135–145)

## 2015-11-23 LAB — GLUCOSE, CAPILLARY
GLUCOSE-CAPILLARY: 260 mg/dL — AB (ref 65–99)
Glucose-Capillary: 177 mg/dL — ABNORMAL HIGH (ref 65–99)

## 2015-11-23 MED ORDER — POTASSIUM CHLORIDE ER 20 MEQ PO TBCR: 20.0000 meq | EXTENDED_RELEASE_TABLET | Freq: Every day | ORAL | Status: DC

## 2015-11-23 MED ORDER — POTASSIUM CHLORIDE CRYS ER 20 MEQ PO TBCR
40.0000 meq | EXTENDED_RELEASE_TABLET | Freq: Once | ORAL | Status: AC
  Administered 2015-11-23: 40 meq via ORAL
  Filled 2015-11-23: qty 2

## 2015-11-23 MED ORDER — RIVAROXABAN 20 MG PO TABS: 20.0000 mg | ORAL_TABLET | Freq: Every day | ORAL | Status: DC

## 2015-11-23 MED ORDER — FERROUS SULFATE 325 (65 FE) MG PO TABS
325.0000 mg | ORAL_TABLET | Freq: Two times a day (BID) | ORAL | Status: DC
  Administered 2015-11-23: 325 mg via ORAL
  Filled 2015-11-23: qty 1

## 2015-11-23 MED ORDER — FERROUS SULFATE 325 (65 FE) MG PO TABS: 325.0000 mg | ORAL_TABLET | Freq: Two times a day (BID) | ORAL | Status: DC

## 2015-11-23 MED ORDER — TORSEMIDE 20 MG PO TABS: 60.0000 mg | ORAL_TABLET | Freq: Every day | ORAL | Status: DC

## 2015-11-23 NOTE — Care Management Important Message (Signed)
Important Message  Patient Details  Name: Marcus Williams MRN: [id number] Date of Birth: [birth year]   Medicare Important Message Given:  Yes    Nathen May 11/23/2015, 10:55 AM

## 2015-11-23 NOTE — Progress Notes (Signed)
Patient in stable condition, this RN went over discharge instructions with patient, they verbalised understanding, tele dc, ccmd notified, iv removed, patient belongings at bedside, patient taken off the floor on a wheelchair

## 2015-11-23 NOTE — Discharge Summary (Signed)
Advanced Heart Failure Discharge Note   Discharge Summary   Patient ID: Marcus Williams MRN: [id number], DOB/AGE: [birth year] 60 y.o. Admit date: 11/21/2015 D/C date:     11/23/2015   Primary Discharge Diagnoses:  1. Aortic stenosis, Moderate 2. Acute on chronic diastolic CHF: EF [id number] on TEE with normal appearing RV.  3. Anemia with melena 4. Atrial Fibrillation, Paroxysmal  Hospital Course:   Marcus Williams is a 60 y.o. male who presented to Dr Burt Knack office for evaluation for aortic Stenosis. He has a complicated history including CABG at age 67, obesity, PAF on Xarelto, and Diabetes Type II.  He was scheduled for RHC and TEE as an outpatient, with results as below.   Violet 11/21/15 showed volume overload with RA of 17, so patient was admitted for further treatment and evaluation.   Pt diuresed well on IV lasix with symptomatic improvement.   Pt Hgb noted to be low and seems to have been trending down recently.  Pt states he has had dark stools for about 2 weeks. GI consult initiated, but with lack of urgency and no procedure over weekend, Pt preferred to go home and have follow up with his primary GI physician, Dr. Tiffany Kocher. This was scheduled as below.  Pt had no overt bleeding during admission, but had + FOBT.  Pt noted to also be Iron deficient and started on po Iron this admit.   Pt diuresed 3.3 L overall, though scales only showed 2 lbs weight loss from admission. (Weight shows down 14 lbs from clinic visit on 11/16/15, however)  on IV lasix up to 80 mg IV BID and transitioned to 60 mg torsemide daily for home (had been on 40 mg lasix po BID). He will discharged to home in stable condition today, with close follow up with his primary cardiologist, Dr Ubaldo Glassing, and GI as below.  Further follow up to be managed by his primary team. We will only follow up here as needed.   Discharge Weight Range: 272 lbs Discharge Vitals: Blood pressure 140/47, pulse 62, temperature 98 F (36.7 C), temperature source  Oral, resp. rate 18, height 5\' 8"  (1.727 m), weight 272 lb 1.6 oz (123.424 kg), SpO2 100 %.  Labs: Lab Results  Component Value Date   WBC 7.2 11/23/2015   HGB 8.9* 11/23/2015   HCT 30.7* 11/23/2015   MCV 73.3* 11/23/2015   PLT 182 11/23/2015     Recent Labs Lab 11/23/15 0258  NA 138  K 3.5  CL 102  CO2 27  BUN 16  CREATININE 1.06  CALCIUM 8.7*  GLUCOSE 179*   Lab Results  Component Value Date   CHOL 104 07/20/2014   HDL 43 07/20/2014   LDLCALC 35 07/20/2014   TRIG 131 07/20/2014   BNP (last 3 results)  Recent Labs  12/22/14 1427 01/14/15 0925 03/26/15 0550  BNP 146.0* 202.0* 199.0*    ProBNP (last 3 results) No results for input(s): PROBNP in the last 8760 hours.   Diagnostic Studies/Procedures   TEE 11/21/15 with Moderate AS and bicuspid valve. Full report pending.    RHC 11/21/15 Fick Cardiac Output 6.03 L/min  Fick Cardiac Output Index 2.59 (L/min)/BSA  Aortic Mean Gradient 10.9 mmHg  Aortic Peak Gradient 9 mmHg  Aortic Valve Area 2.51  Aortic Value Area Index 1.08 cm2/BSA  RA A Wave 22 mmHg  RA V Wave 19 mmHg  RA Mean 17 mmHg  RV Systolic Pressure 60 mmHg  RV Diastolic Pressure 11 mmHg  RV EDP 21 mmHg  PA Systolic Pressure 60 mmHg  PA Diastolic Pressure 28 mmHg  PA Mean 40 mmHg  PW A Wave 31 mmHg  PW V Wave 41 mmHg  PW Mean 27 mmHg  AO Systolic Pressure A999333 mmHg  AO Diastolic Pressure 62 mmHg  AO Mean 91 mmHg  LV Systolic Pressure 123XX123 mmHg  LV Diastolic Pressure 14 mmHg  LV EDP 32 mmHg  Arterial Occlusion Pressure Extended Systolic Pressure 0000000 mmHg  Arterial Occlusion Pressure Extended Diastolic Pressure 61 mmHg  Arterial Occlusion Pressure Extended Mean Pressure 90 mmHg  Left Ventricular Apex Extended Systolic Pressure 0000000 mmHg  Left Ventricular Apex Extended Diastolic Pressure 13 mmHg  Left Ventricular Apex Extended EDP Pressure 29 mmHg  QP/QS 1.05  TPVR Index 14.73 HRUI  TSVR Index 35.16 HRUI  PVR SVR Ratio 0.14  TPVR/TSVR Ratio  0.42     Discharge Medications     Medication List    STOP taking these medications        furosemide 40 MG tablet  Commonly known as:  LASIX     meloxicam 15 MG tablet  Commonly known as:  MOBIC      TAKE these medications        ADVAIR DISKUS 250-50 MCG/DOSE Aepb  Generic drug:  Fluticasone-Salmeterol  Inhale 1 puff into the lungs 2 (two) times daily as needed (For shortness of breath.).     albuterol 108 (90 Base) MCG/ACT inhaler  Commonly known as:  PROVENTIL HFA;VENTOLIN HFA  Inhale 2 puffs into the lungs every 6 (six) hours as needed for wheezing or shortness of breath.     ALPRAZolam 0.25 MG tablet  Commonly known as:  XANAX  Take 0.25 mg by mouth 2 (two) times daily as needed for anxiety.     diltiazem 300 MG 24 hr capsule  Commonly known as:  CARDIZEM CD  Take 1 capsule (300 mg total) by mouth daily.     ferrous sulfate 325 (65 FE) MG tablet  Take 1 tablet (325 mg total) by mouth 2 (two) times daily with a meal.     fluticasone 50 MCG/ACT nasal spray  Commonly known as:  FLONASE  Place 1 spray into both nostrils daily as needed for allergies.     glimepiride 4 MG tablet  Commonly known as:  AMARYL  Take 4 mg by mouth daily.     insulin NPH Human 100 UNIT/ML injection  Commonly known as:  HUMULIN N,NOVOLIN N  Inject 60 Units into the skin 2 (two) times daily.     ipratropium-albuterol 0.5-2.5 (3) MG/3ML Soln  Commonly known as:  DUONEB  Take 3 mLs by nebulization every 4 (four) hours as needed (for shortness of breath).     metFORMIN 1000 MG tablet  Commonly known as:  GLUCOPHAGE  Take 1,000 mg by mouth 2 (two) times daily with a meal.     metoprolol 100 MG tablet  Commonly known as:  LOPRESSOR  Take 100 mg by mouth 2 (two) times daily.     nitroGLYCERIN 0.4 MG SL tablet  Commonly known as:  NITROSTAT  Place 0.4 mg under the tongue every 5 (five) minutes as needed for chest pain.     pantoprazole 40 MG tablet  Commonly known as:  PROTONIX    Take 40 mg by mouth daily.     Potassium Chloride ER 20 MEQ Tbcr  Take 20 mEq by mouth daily.     rivaroxaban 20 MG Tabs tablet  Commonly known as:  XARELTO  Take 1 tablet (20 mg total) by mouth daily with breakfast.     simvastatin 40 MG tablet  Commonly known as:  ZOCOR  Take 40 mg by mouth at bedtime.     torsemide 20 MG tablet  Commonly known as:  DEMADEX  Take 3 tablets (60 mg total) by mouth daily.     traZODone 100 MG tablet  Commonly known as:  DESYREL  Take 100 mg by mouth at bedtime.        Disposition   The patient will be discharged in stable condition to home. Discharge Instructions    Diet - low sodium heart healthy    Complete by:  As directed      Heart Failure patients record your daily weight using the same scale at the same time of day    Complete by:  As directed      Increase activity slowly    Complete by:  As directed           Follow-up Information    Follow up with Lavera Guise, PA-C On 11/30/2015.   Why:  at 1030 for GI check up concerning Melena x 2 weeks.  OK to see PA/NP.    Contact information:   [place] [address] [phone]       Follow up with Teodoro Spray., MD On 11/29/2015.   Specialty:  Cardiology   Why:  at 1030 pm for post hospital. Please take all of your medications to your visit.   Contact information:   [place] [address] [phone]       Schedule an appointment as soon as possible for a visit with SPARKS,JEFFREY D, MD.   Specialty:  Internal Medicine   Why:  for post hospital follow up.    Contact information:   [place] [address] [phone]         Duration of Discharge Encounter: Greater than 35 minutes   Signed, Shirley Friar PA-C 11/23/2015, 2:32 PM

## 2015-11-23 NOTE — Progress Notes (Signed)
Patient ID: Marcus Williams, male   DOB: [birth year], 60 y.o.   MRN: Rosa [id number]     SUBJECTIVE: Patient feels less short of breath with walking today.  He diuresed well yesterday. Reports black stool x 2 weeks.  He is iron deficient.   Scheduled Meds: . diltiazem  300 mg Oral Daily  . ferrous sulfate  325 mg Oral BID WC  . furosemide  80 mg Intravenous BID  . glimepiride  4 mg Oral QAC breakfast  . insulin aspart  0-15 Units Subcutaneous TID WC  . insulin NPH Human  60 Units Subcutaneous QAC breakfast  . insulin NPH Human  60 Units Subcutaneous QHS  . metoprolol  100 mg Oral BID  . pantoprazole  40 mg Oral Daily  . potassium chloride  40 mEq Oral Once  . simvastatin  40 mg Oral QHS  . sodium chloride flush  3 mL Intravenous Q12H  . traZODone  100 mg Oral QHS   Continuous Infusions:  PRN Meds:.sodium chloride, acetaminophen, albuterol, ALPRAZolam, fentaNYL (SUBLIMAZE) injection, fluticasone, ipratropium-albuterol, nitroGLYCERIN, ondansetron (ZOFRAN) IV, ondansetron (ZOFRAN) IV, sodium chloride flush    Filed Vitals:   11/22/15 1339 11/22/15 2125 11/23/15 0522 11/23/15 0542  BP: 136/54 144/61 125/50   Pulse: 52 53 57   Temp: 97.7 F (36.5 C) 98 F (36.7 C) 97.8 F (36.6 C)   TempSrc: Oral Oral Oral   Resp: 18 18 16    Height:      Weight:    272 lb 1.6 oz (123.424 kg)  SpO2: 98% 99% 95%     Intake/Output Summary (Last 24 hours) at 11/23/15 0828 Last data filed at 11/22/15 2044  Gross per 24 hour  Intake    480 ml  Output   2700 ml  Net  -2220 ml    LABS: Basic Metabolic Panel:  Recent Labs  11/22/15 0300 11/23/15 0258  NA 138 138  K 3.4* 3.5  CL 102 102  CO2 28 27  GLUCOSE 280* 179*  BUN 16 16  CREATININE 1.11 1.06  CALCIUM 8.7* 8.7*   Liver Function Tests: No results for input(s): AST, ALT, ALKPHOS, BILITOT, PROT, ALBUMIN in the last 72 hours. No results for input(s): LIPASE, AMYLASE in the last 72 hours. CBC:  Recent Labs  11/22/15 0300 11/23/15 0258   WBC 7.7 7.2  HGB 8.3* 8.9*  HCT 29.2* 30.7*  MCV 73.9* 73.3*  PLT 166 182   Cardiac Enzymes: No results for input(s): CKTOTAL, CKMB, CKMBINDEX, TROPONINI in the last 72 hours. BNP: Invalid input(s): POCBNP D-Dimer: No results for input(s): DDIMER in the last 72 hours. Hemoglobin A1C: No results for input(s): HGBA1C in the last 72 hours. Fasting Lipid Panel: No results for input(s): CHOL, HDL, LDLCALC, TRIG, CHOLHDL, LDLDIRECT in the last 72 hours. Thyroid Function Tests: No results for input(s): TSH, T4TOTAL, T3FREE, THYROIDAB in the last 72 hours.  Invalid input(s): FREET3 Anemia Panel:  Recent Labs  11/22/15 0854  VITAMINB12 219  FERRITIN 20*  TIBC 421  IRON 32*    RADIOLOGY: No results found.  PHYSICAL EXAM General: NAD Neck: Thick, JVP 8-9 cm, no thyromegaly or thyroid nodule.  Lungs: Clear to auscultation bilaterally with normal respiratory effort. CV: Nondisplaced PMI.  Heart regular S1/S2, no S3/S4, 3/6 crescendo-decrescendo systolic murmur RUSB with clear S2.  1+ ankle edema.   Abdomen: Soft, nontender, no hepatosplenomegaly, no distention.  Neurologic: Alert and oriented x 3.  Psych: Normal affect. Extremities: No clubbing or cyanosis.   TELEMETRY:  Reviewed telemetry pt in NSR  ASSESSMENT AND PLAN: 60 yo with CAD s/p CABG and paroxysmal atrial fibrillation was admitted for evaluation of aortic stenosis.  1. Aortic stenosis: This appeared moderate by TEE and hemodynamic cath on 6/7.  He does have a bicuspid valve.  Not significant enough disease for valve replacement at this point.  2. Acute on chronic diastolic CHF: EF [id number] on TEE with normal-appearing RV.  He was significantly volume overloaded on RHC 6/7 and looks at least mildly volume overloaded still on exam.  Breathing better with diuresis.  - Continue Lasix 80 mg IV bid today. Would transition to torsemide 60 mg po daily tomorrow (was taking Lasix 80 mg po daily at home).   3. Anemia: Hemoglobin has  been falling recently.  Had c-scope within the last year, per report was ok.  Has had black stool x 2 weeks.  Iron deficient on Fe studies.  - Continue PPI.  - Guaiac stool.  - Needs GI workup, suspect at least an EGD would be in order.  Will hold Xarelto today, have GI see, possible EGD tomorrow. 4. Atrial fibrillation: Paroxysmal.  He is in NSR.  Xarelto dosing should be 20 mg daily.  Will hold today for possible GI evaluation.  5. Disposition: Possible GI procedure tomorrow, then home.  If no GI procedure, could potentially go home today.    Loralie Champagne 11/23/2015 8:28 AM

## 2015-11-23 NOTE — Consult Note (Signed)
   [facility] CM Inpatient Consult   11/23/2015  Marcus Williams December 09, 1955 [name]:[id number]   Patient screened for [facility] Management services. Went to bedside prior to discharge to offer and explain [facility] Care Management program with patient. [name] declined [facility] follow up. He reports, " I have several doctor appointments next week, I do not need those services right now". Accepted [facility] Care Management brochure with contact information to call in future if changes mind. Will make inpatient RNCM aware that patient declined [facility] Management program services.  Marthenia Rolling, MSN-Ed, RN,BSN [facility] Liaison [phone]

## 2015-11-26 DIAGNOSIS — L97511 Non-pressure chronic ulcer of other part of right foot limited to breakdown of skin: Secondary | ICD-10-CM | POA: Diagnosis not present

## 2015-11-26 DIAGNOSIS — Z794 Long term (current) use of insulin: Secondary | ICD-10-CM | POA: Diagnosis not present

## 2015-11-26 DIAGNOSIS — E1140 Type 2 diabetes mellitus with diabetic neuropathy, unspecified: Secondary | ICD-10-CM | POA: Diagnosis not present

## 2015-11-29 DIAGNOSIS — I350 Nonrheumatic aortic (valve) stenosis: Secondary | ICD-10-CM | POA: Diagnosis not present

## 2015-11-29 DIAGNOSIS — D500 Iron deficiency anemia secondary to blood loss (chronic): Secondary | ICD-10-CM | POA: Diagnosis not present

## 2015-11-29 DIAGNOSIS — Q231 Congenital insufficiency of aortic valve: Secondary | ICD-10-CM | POA: Diagnosis not present

## 2015-11-29 DIAGNOSIS — I25810 Atherosclerosis of coronary artery bypass graft(s) without angina pectoris: Secondary | ICD-10-CM | POA: Diagnosis not present

## 2015-11-29 DIAGNOSIS — I10 Essential (primary) hypertension: Secondary | ICD-10-CM | POA: Diagnosis not present

## 2015-11-29 DIAGNOSIS — I5033 Acute on chronic diastolic (congestive) heart failure: Secondary | ICD-10-CM | POA: Diagnosis not present

## 2015-11-29 DIAGNOSIS — I480 Paroxysmal atrial fibrillation: Secondary | ICD-10-CM | POA: Diagnosis not present

## 2015-11-29 DIAGNOSIS — R600 Localized edema: Secondary | ICD-10-CM | POA: Diagnosis not present

## 2015-11-29 DIAGNOSIS — E7800 Pure hypercholesterolemia, unspecified: Secondary | ICD-10-CM | POA: Diagnosis not present

## 2015-11-30 DIAGNOSIS — I25810 Atherosclerosis of coronary artery bypass graft(s) without angina pectoris: Secondary | ICD-10-CM | POA: Diagnosis not present

## 2015-11-30 DIAGNOSIS — R0602 Shortness of breath: Secondary | ICD-10-CM | POA: Diagnosis not present

## 2015-11-30 DIAGNOSIS — I509 Heart failure, unspecified: Secondary | ICD-10-CM | POA: Diagnosis not present

## 2015-11-30 DIAGNOSIS — J4540 Moderate persistent asthma, uncomplicated: Secondary | ICD-10-CM | POA: Diagnosis not present

## 2015-12-30 DIAGNOSIS — I509 Heart failure, unspecified: Secondary | ICD-10-CM | POA: Diagnosis not present

## 2015-12-30 DIAGNOSIS — I25810 Atherosclerosis of coronary artery bypass graft(s) without angina pectoris: Secondary | ICD-10-CM | POA: Diagnosis not present

## 2015-12-30 DIAGNOSIS — J4540 Moderate persistent asthma, uncomplicated: Secondary | ICD-10-CM | POA: Diagnosis not present

## 2015-12-30 DIAGNOSIS — R0602 Shortness of breath: Secondary | ICD-10-CM | POA: Diagnosis not present

## 2015-12-31 DIAGNOSIS — I5033 Acute on chronic diastolic (congestive) heart failure: Secondary | ICD-10-CM | POA: Diagnosis not present

## 2015-12-31 DIAGNOSIS — E782 Mixed hyperlipidemia: Secondary | ICD-10-CM | POA: Diagnosis not present

## 2015-12-31 DIAGNOSIS — Z125 Encounter for screening for malignant neoplasm of prostate: Secondary | ICD-10-CM | POA: Diagnosis not present

## 2015-12-31 DIAGNOSIS — I10 Essential (primary) hypertension: Secondary | ICD-10-CM | POA: Diagnosis not present

## 2015-12-31 DIAGNOSIS — E1143 Type 2 diabetes mellitus with diabetic autonomic (poly)neuropathy: Secondary | ICD-10-CM | POA: Diagnosis not present

## 2015-12-31 DIAGNOSIS — R21 Rash and other nonspecific skin eruption: Secondary | ICD-10-CM | POA: Diagnosis not present

## 2015-12-31 DIAGNOSIS — Z79899 Other long term (current) drug therapy: Secondary | ICD-10-CM | POA: Diagnosis not present

## 2016-01-18 IMAGING — MR MRI LUMBAR SPINE WITHOUT CONTRAST
5 series · 36 of 48 positions shown · non-contrast
Comparison: 03/11/2014.

CLINICAL DATA: Low back pain. Bilateral leg pain. No prior
operation. Severe low back pain. Symptoms for 6 months. Initial
encounter.

EXAM:
MRI LUMBAR SPINE WITHOUT CONTRAST
TECHNIQUE: Multiplanar, multisequence MR imaging of the lumbar spine was
performed. No intravenous contrast was administered.

[Series 2: T2 · sagittal · 4.0mm · 0.81mm/px · 6 of 17 slices shown (1 of 2)]
[im 1/17]
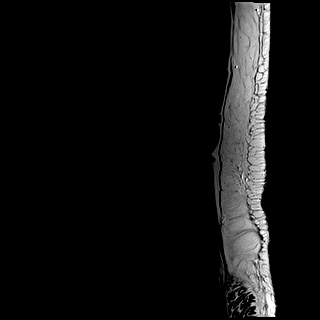
[im 4/17]
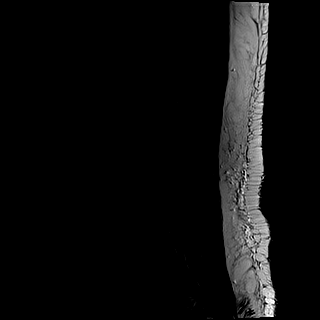
[im 7/17]
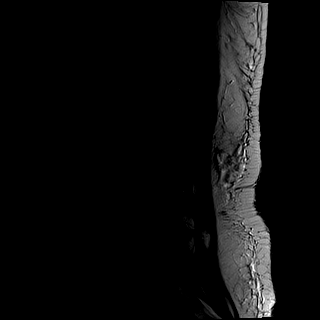
[im 10/17]
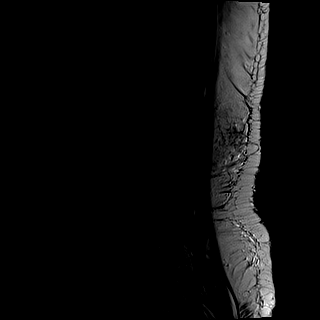
[im 13/17]
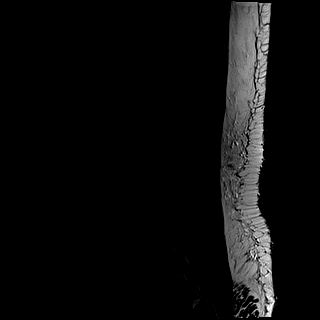
[im 17/17]
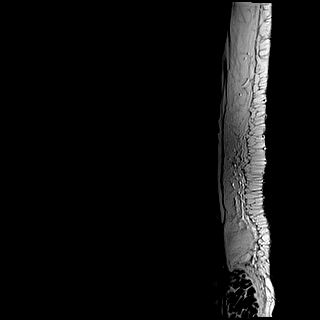

[Series 3: T1 · sagittal · 4.0mm · 0.81mm/px · 6 of 17 slices shown (1 of 2)]
[im 1/17]
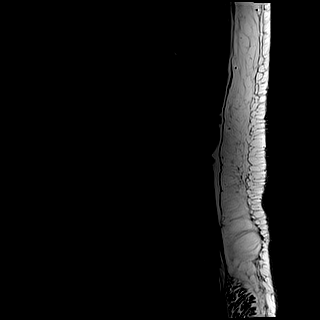
[im 4/17]
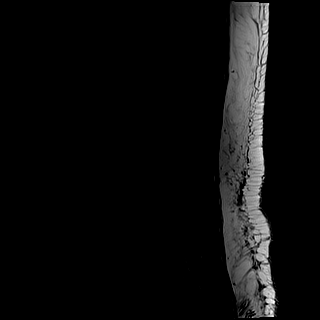
[im 7/17]
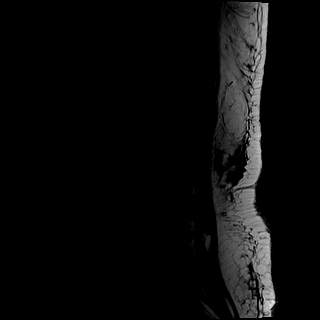
[im 10/17]
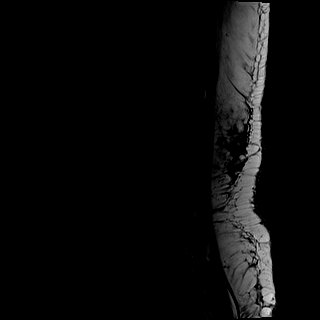
[im 13/17]
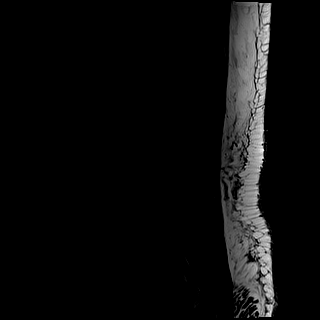
[im 17/17]
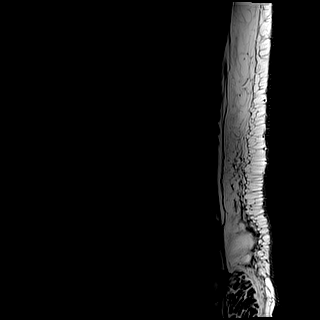

[Series 4: STIR · sagittal · 4.0mm · 1.02mm/px · 6 of 17 slices shown]
[im 1/17]
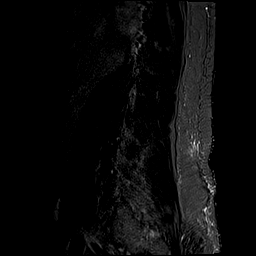
[im 4/17]
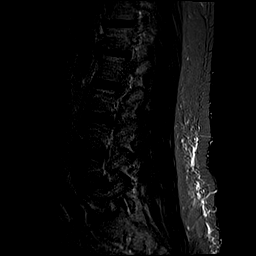
[im 7/17]
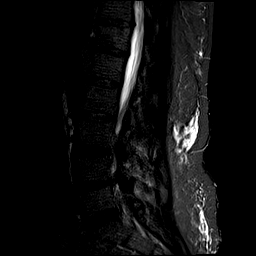
[im 10/17]
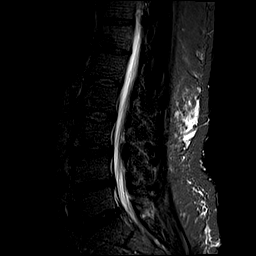
[im 13/17]
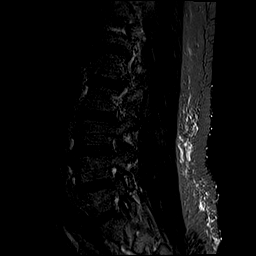
[im 17/17]
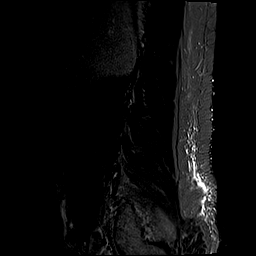

[Series 5: T2 · axial · 4.0mm · 0.78mm/px · z∈[-145,+73]mm · 9 of 39 slices shown (2 of 2)]
[im 1/39]
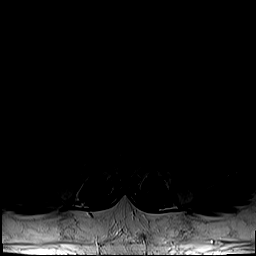
[im 6/39]
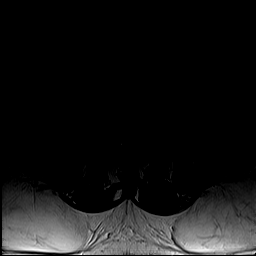
[im 11/39]
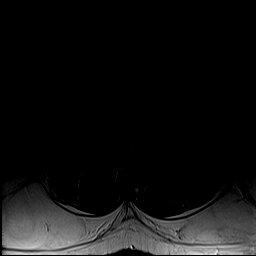
[im 17/39]
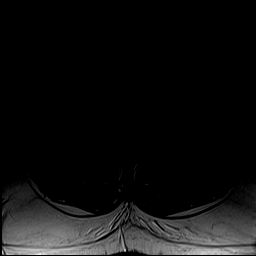
[im 20/39]
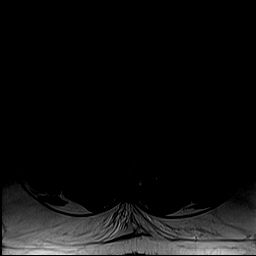
[im 22/39]
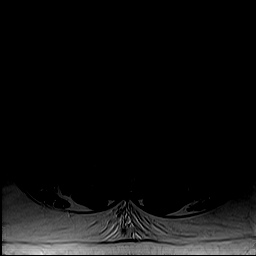
[im 28/39]
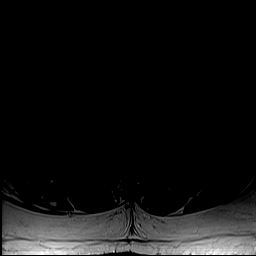
[im 33/39]
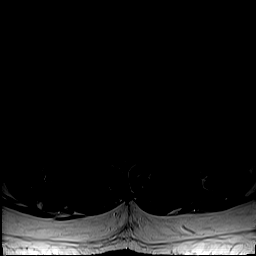
[im 39/39]
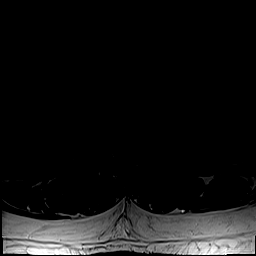

[Series 6: T1 · axial · 4.0mm · 0.39mm/px · z∈[-145,+73]mm · 9 of 39 slices shown (2 of 2)]
[im 1/39]
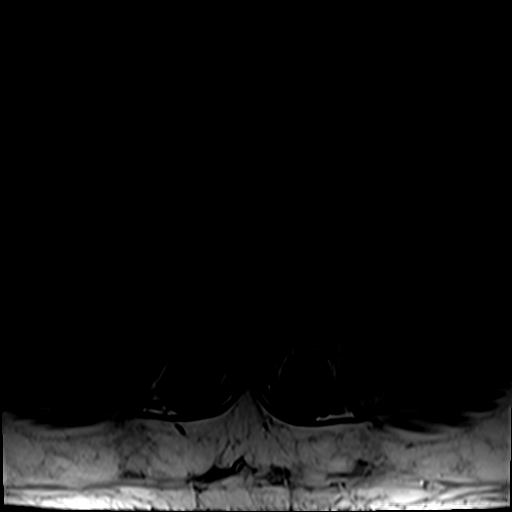
[im 6/39]
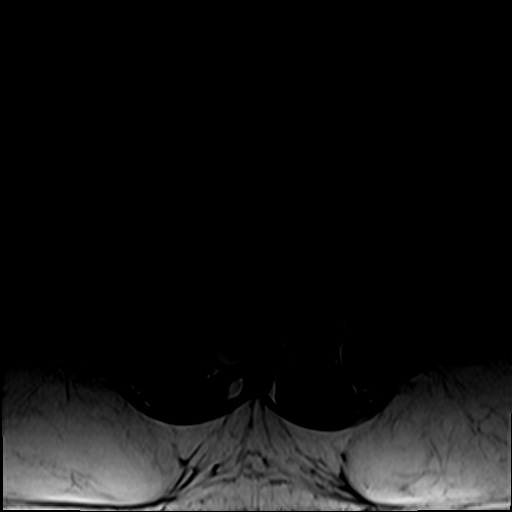
[im 11/39]
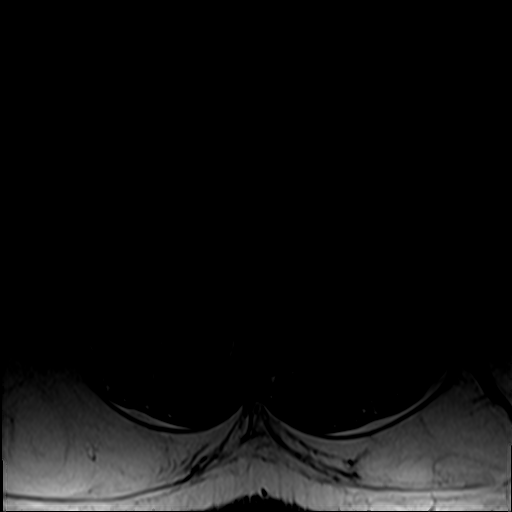
[im 17/39]
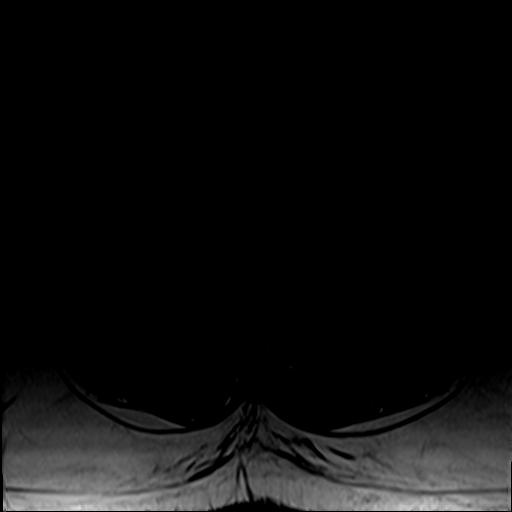
[im 20/39]
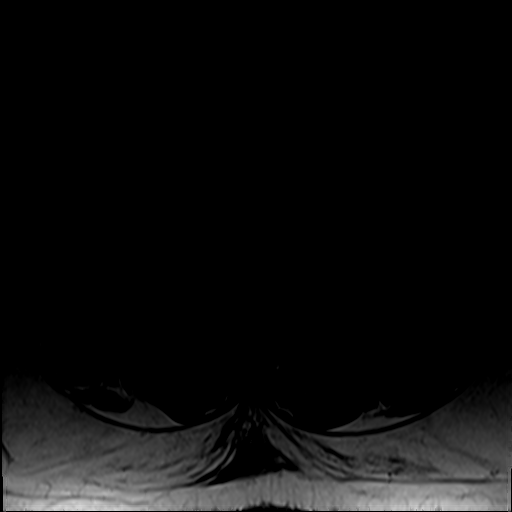
[im 22/39]
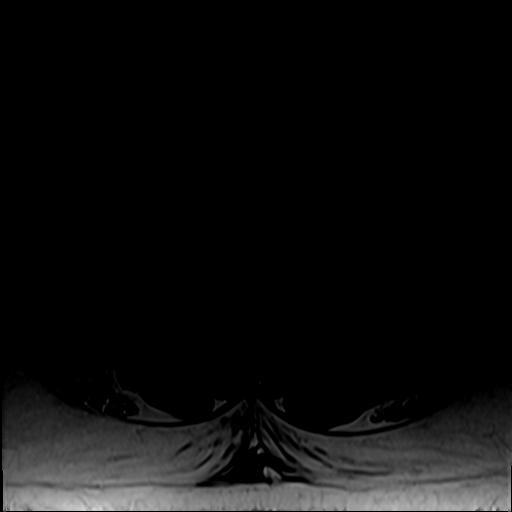
[im 28/39]
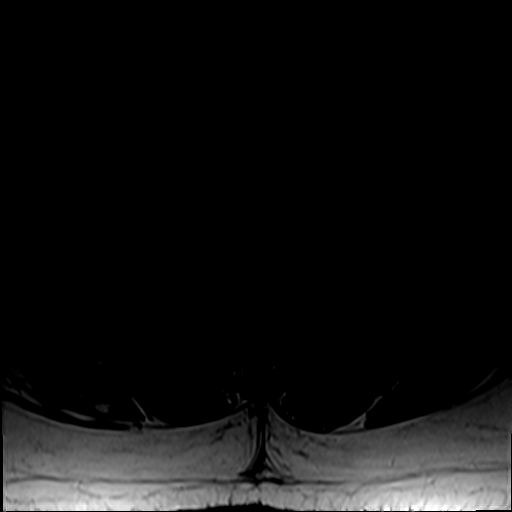
[im 33/39]
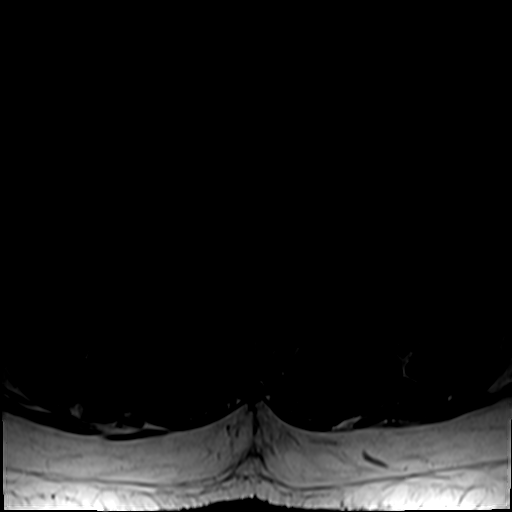
[im 39/39]
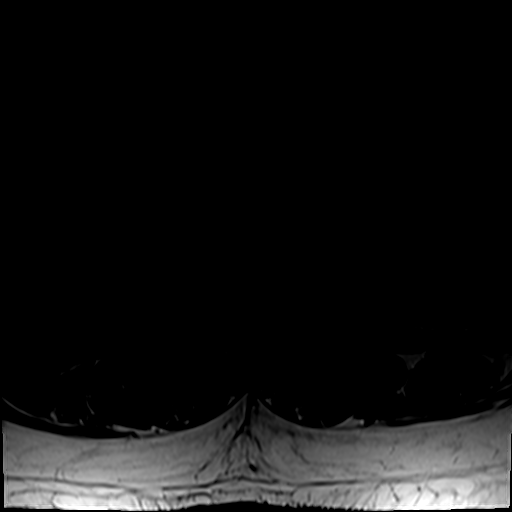

[36 of 48 positions shown; findings below may reference images not displayed]

FINDINGS: Segmentation: The numbering convention used for this exam termed
L5-S1 as the last intervertebral disc space. Five lumbar type
vertebral bodies on prior CT.

Alignment: Mild levoconvex curve of the lumbar spine with the apex
at L4. No spondylolisthesis.

Vertebrae: Large hemangioma in the inferior aspect of T12. Scattered
Schmorl's nodes are present. Mild degenerative endplate changes at
L4-L5.

Conus medullaris: Normal at L1.

Paraspinal tissues: Normal.

Disc levels:

T12-L1:  Mild circumferential disc bulging.  No stenosis.

L1-L2:  Negative.

L2-L3:  Negative.

L3-L4: Mild facet hypertrophy. Broad-based LEFT eccentric posterior
disc bulging. This extends through the LEFT neural foramen and
contacts the exited LEFT L3 nerve. Central canal and lateral
recesses appear patent. The neural foramina are adequately patent.

L4-L5: Disc desiccation and degeneration with RIGHT eccentric
broad-based posterior disc bulging. LEFT neural foramen is patent
there is mild to moderate RIGHT foraminal stenosis potentially
affecting the RIGHT L4 nerve. The central canal is adequately
patent. Mild RIGHT subarticular stenosis associated with disc
bulging. The disc is asymmetrically collapsed on the RIGHT relative
to the LEFT.

L5-S1: LEFT subarticular zone protrusion just contacting the
descending LEFT S1 nerve. Central canal and neural foramina are
patent.
IMPRESSION: Mild lumbar spine degenerative disease. In this patient with LEFT
lower extremity symptoms, LEFT eccentric disc bulge at L3-L4
contacting the exited LEFT L3 nerve could be implicated. The most
severe degenerative disc disease is at L4-L5 and is eccentric to the
RIGHT.

## 2016-02-10 IMAGING — US US ABDOMEN COMPLETE
1 series · 13 of 25 positions shown · non-contrast
Comparison: CT abdomen and pelvis 03/11/2014

CLINICAL DATA: LEFT upper quadrant pain and swelling for 5-6 months
with increase in severity over past month, personal history of
coronary disease post MI, type II diabetes mellitus, hypertension,
atrial fibrillation, COPD

EXAM:
ULTRASOUND ABDOMEN COMPLETE

[Series 1: us abdomen complete · 0.25mm/px · 13 of 105 slices shown]
[im 1/105]
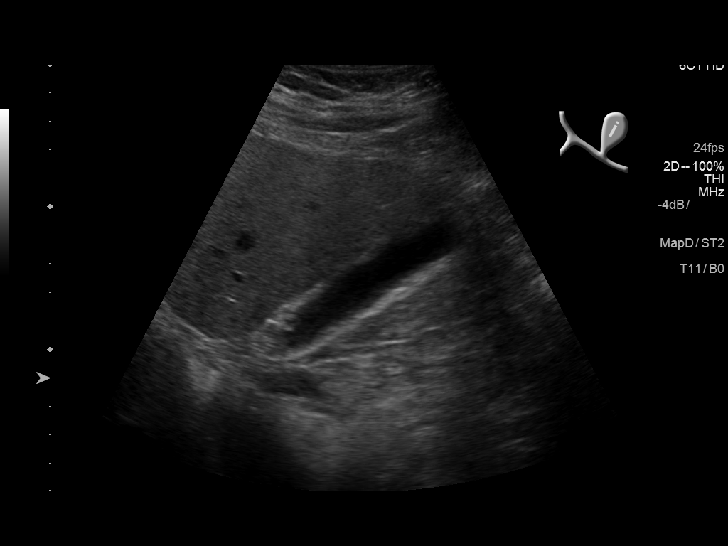
[im 9/105]
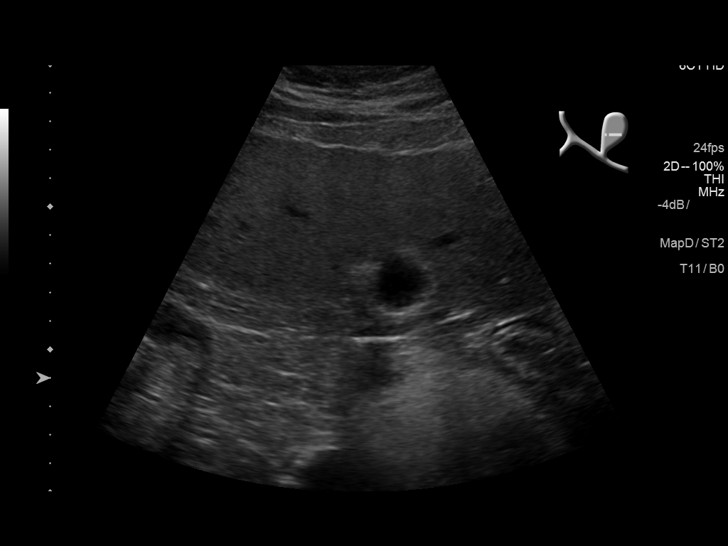
[im 18/105]
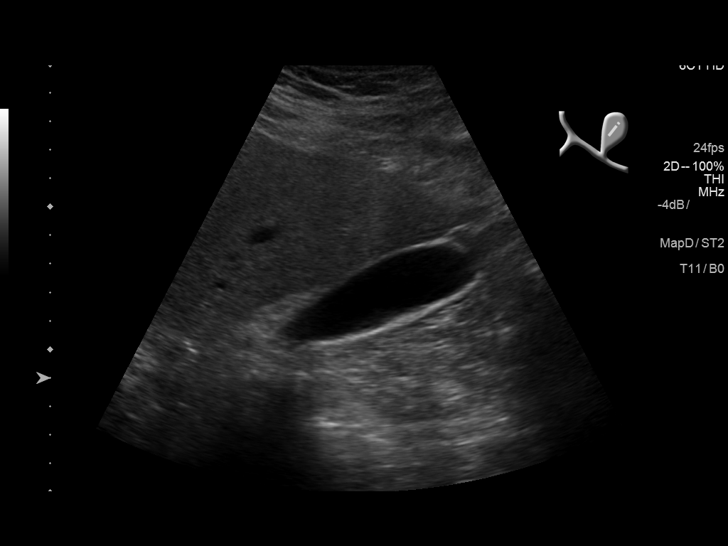
[im 27/105]
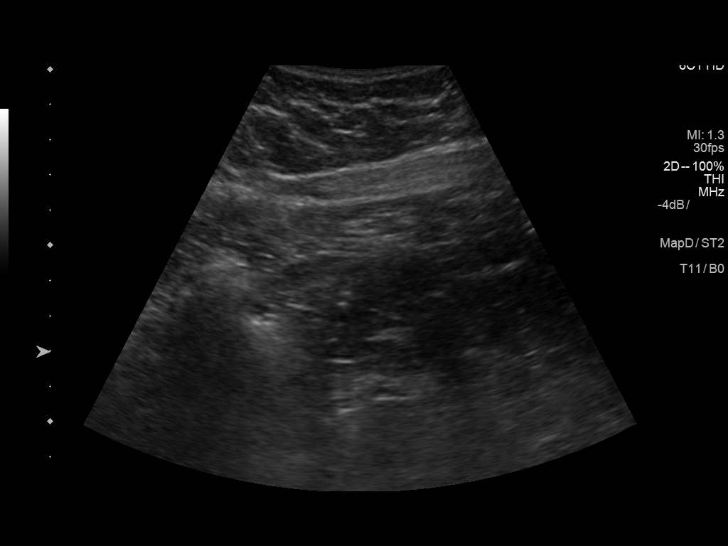
[im 35/105]
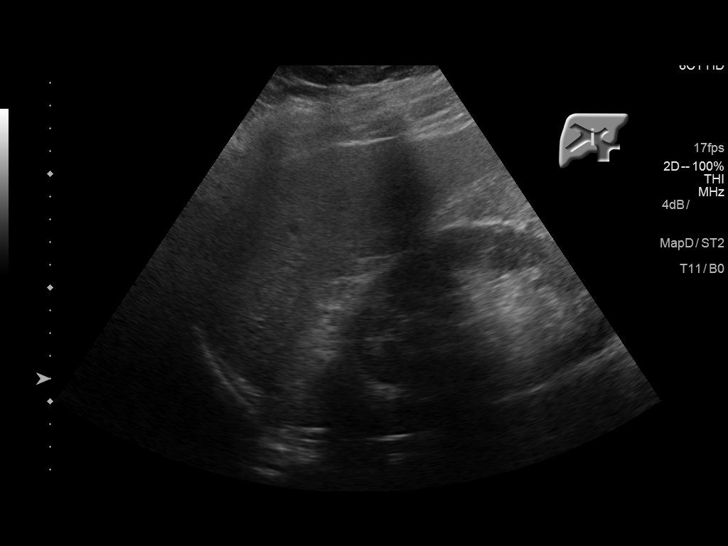
[im 44/105]
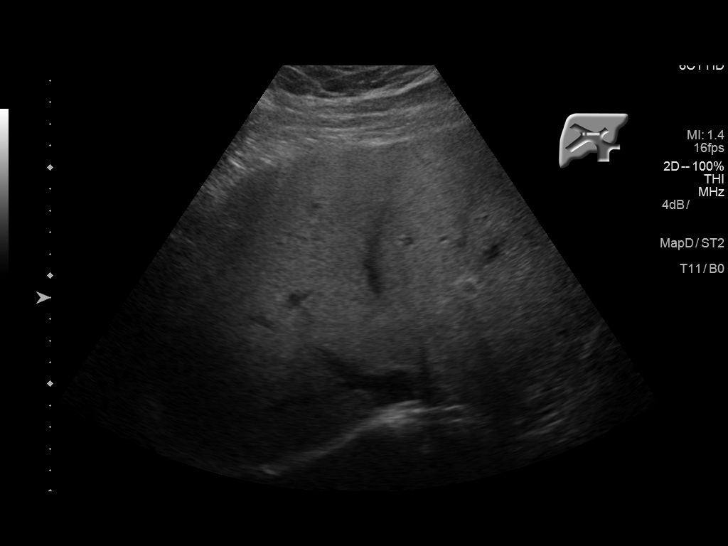
[im 53/105]
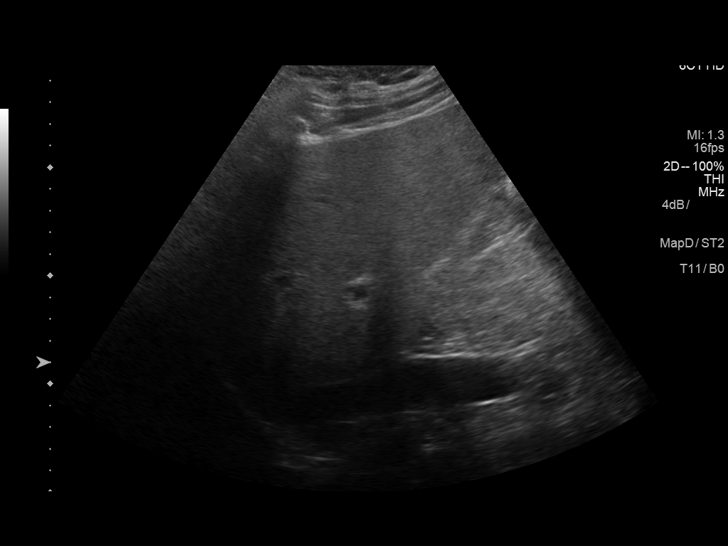
[im 61/105]
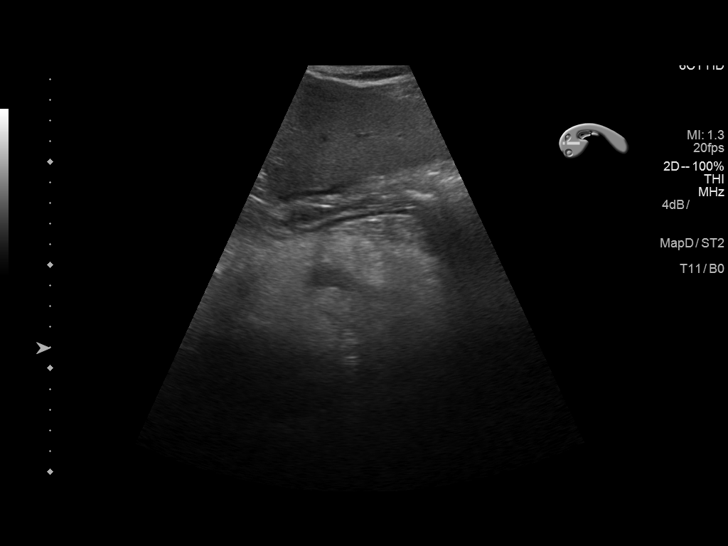
[im 70/105]
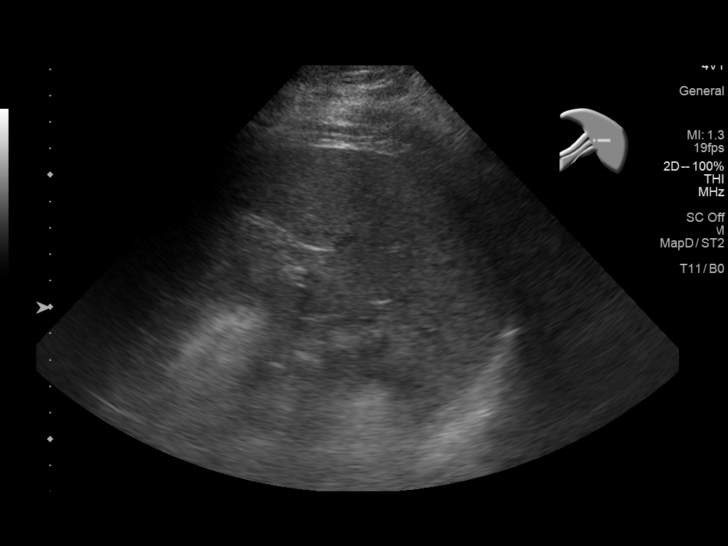
[im 79/105]
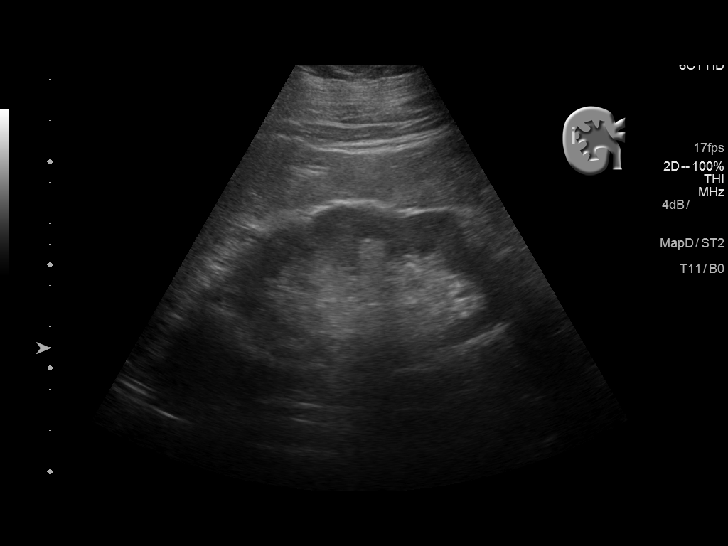
[im 87/105]
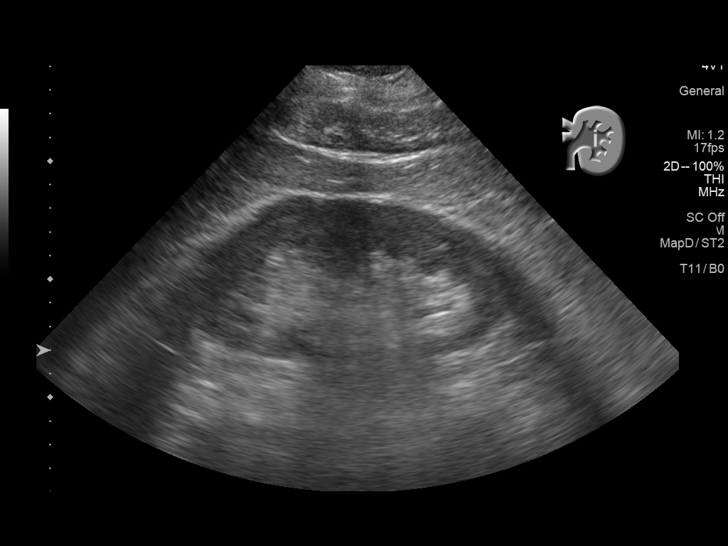
[im 96/105]
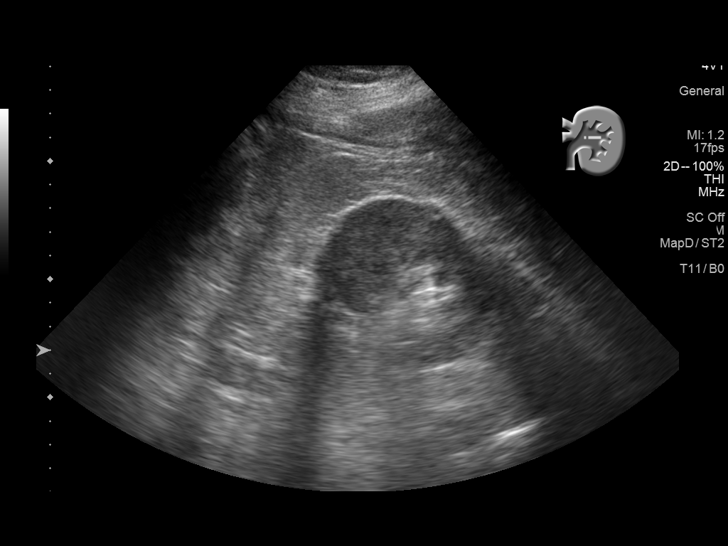
[im 105/105]
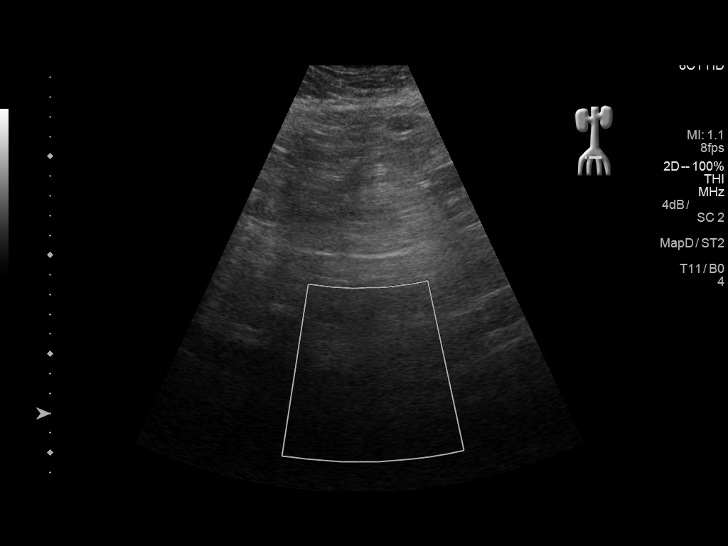

[13 of 25 positions shown; findings below may reference images not displayed]

FINDINGS: Gallbladder: Normally distended without stones or wall thickening.

No pericholecystic fluid or sonographic Murphy sign.

Common bile duct: Diameter: Normal caliber 3 mm diameter

Liver: Echogenic, likely fatty infiltration, though this can be seen
with cirrhosis and certain infiltrative disorders. No focal hepatic
mass or nodularity. Hepatopetal portal venous flow.

IVC: Normal appearance

Pancreas: Portions of tail obscured by bowel gas. Visualized
parenchyma increased echogenicity question fatty replacement. No
definite focal mass lesion or ductal dilatation.

Spleen: Normal appearance, 9.0 cm length

Right Kidney: Length: 14.8 cm. Mild cortical thinning. Normal
echogenicity. No mass or hydronephrosis.

Left Kidney: Length: 14.6 cm. Mild cortical thinning. Normal
cortical echogenicity. No mass or hydronephrosis.

Abdominal aorta: Normal calibre

Other findings: No free fluid. No sonographic abnormalities
identified at this site of swelling and discomfort in the LEFT upper
quadrant.
IMPRESSION: Probable fatty infiltration of liver as above.

Incomplete pancreatic visualization.

No other definite sonographic abnormalities identified.

## 2016-02-11 IMAGING — US US EXTREM LOW VENOUS*R*
1 series · 13 of 24 positions shown · non-contrast
Comparison: None.

CLINICAL DATA: 59-year-old male with a history of pain and swelling
for 1-1/2 weeks



[Series 1: us extrem low venous*right* · 0.11mm/px · 13 of 44 slices shown]
[im 1/44]
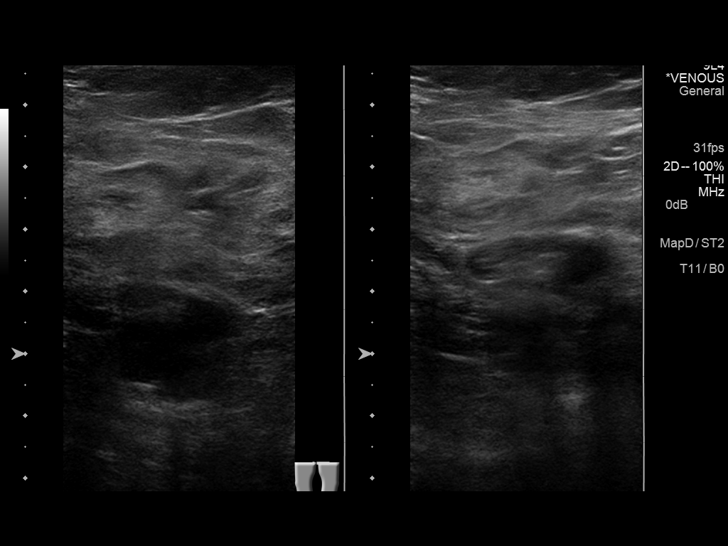
[im 4/44]
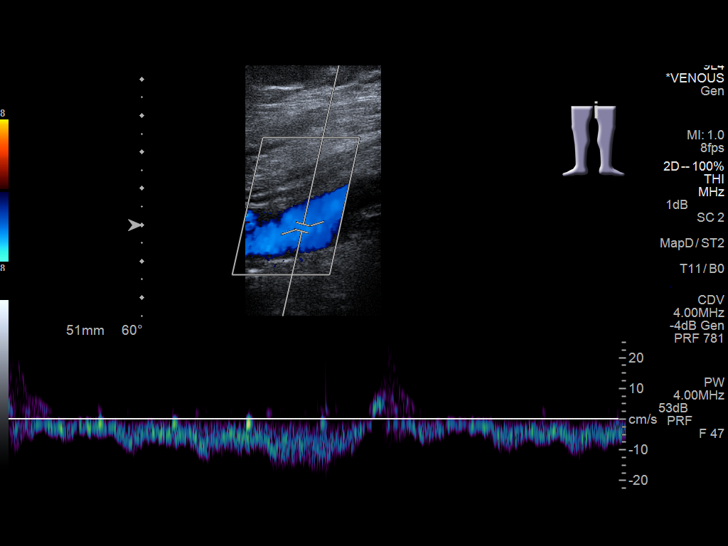
[im 8/44]
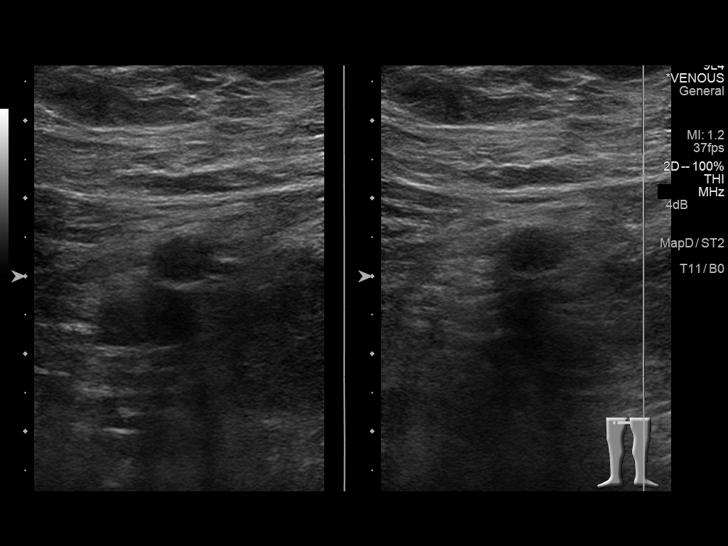
[im 12/44]
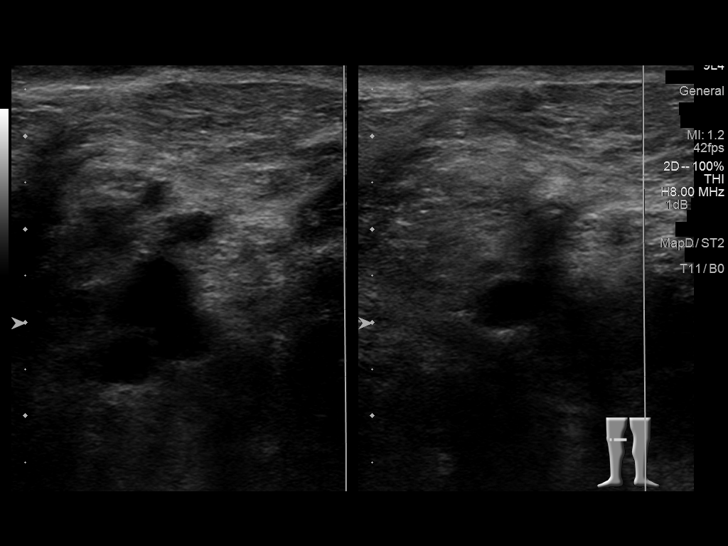
[im 15/44]
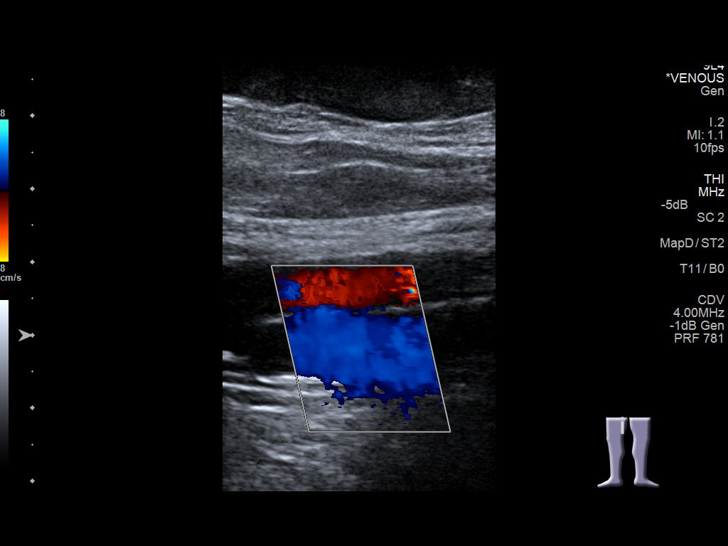
[im 19/44]
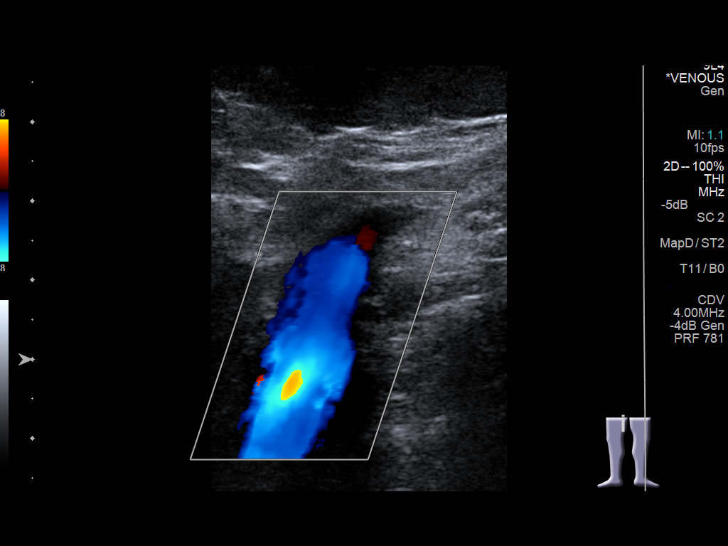
[im 23/44]
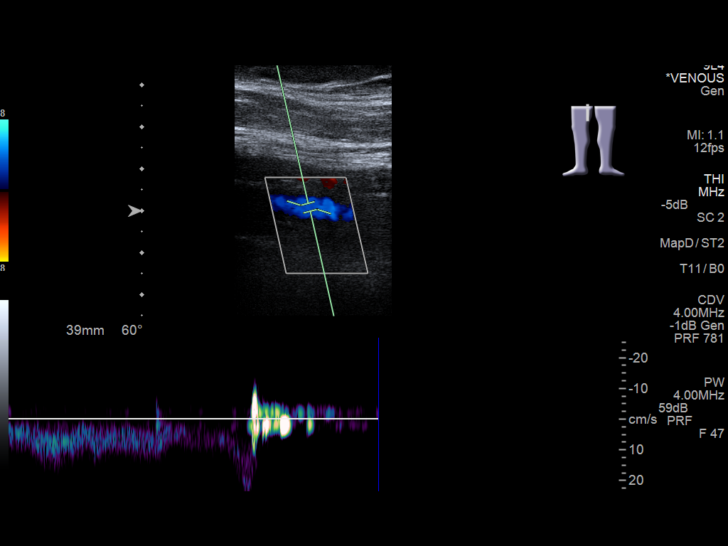
[im 25/44]
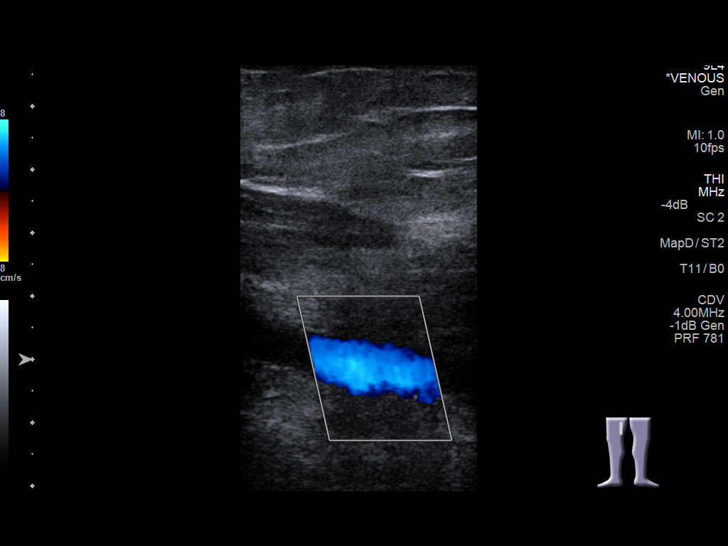
[im 29/44]
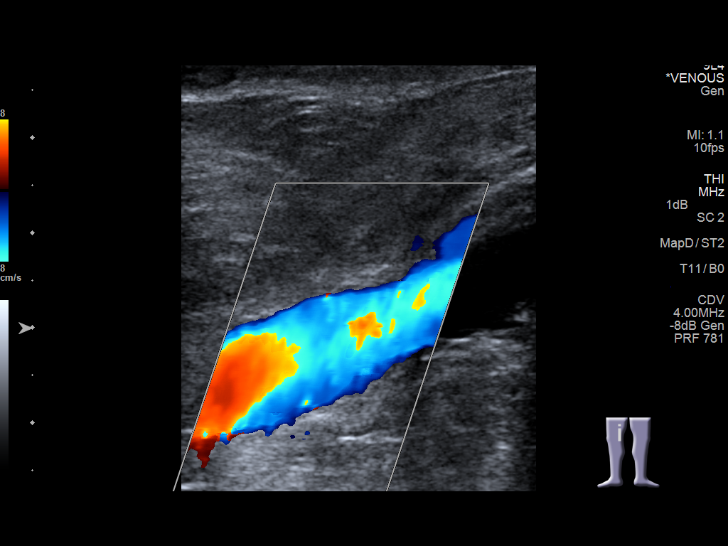
[im 32/44]
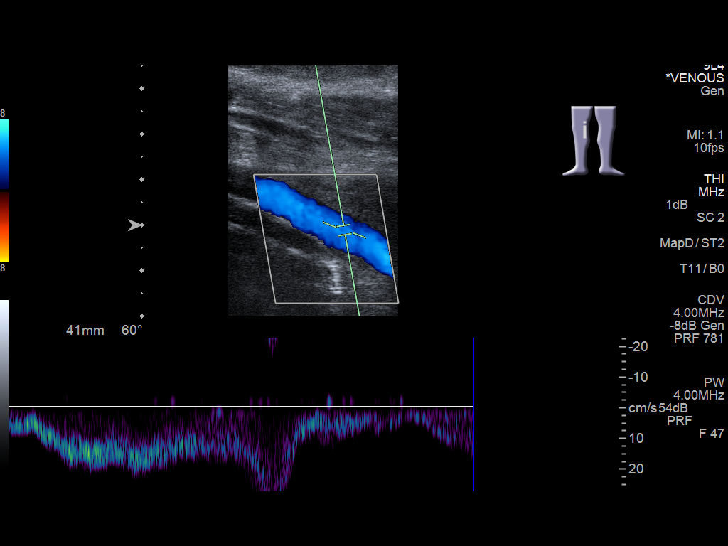
[im 36/44]
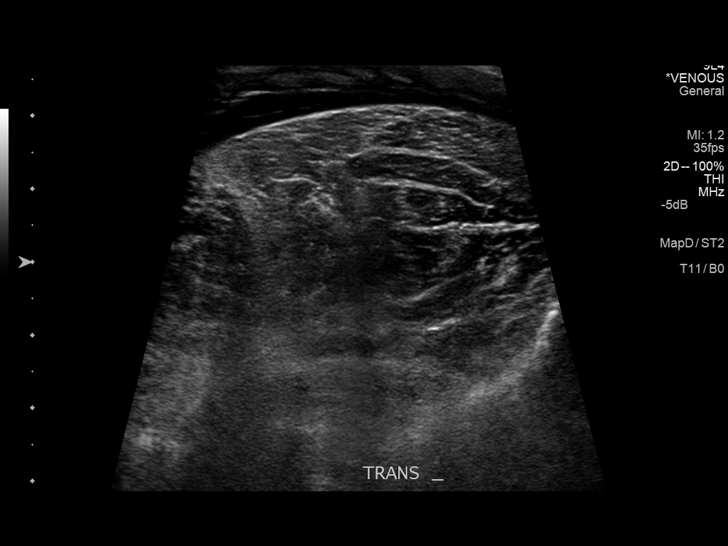
[im 40/44]
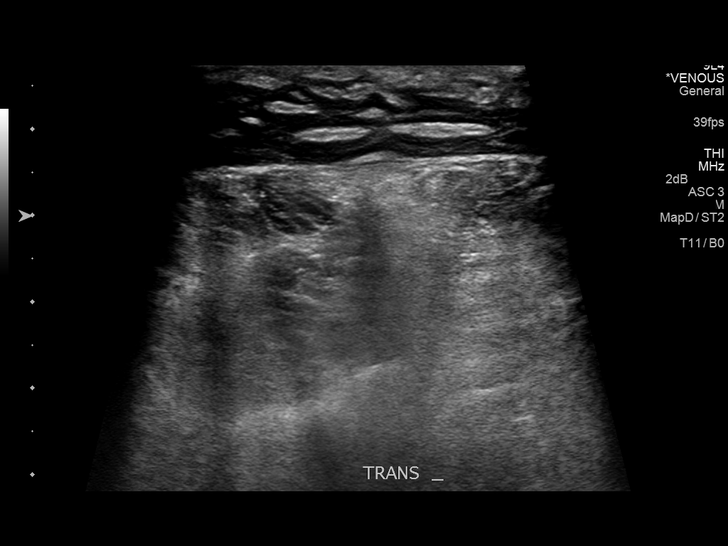
[im 44/44]
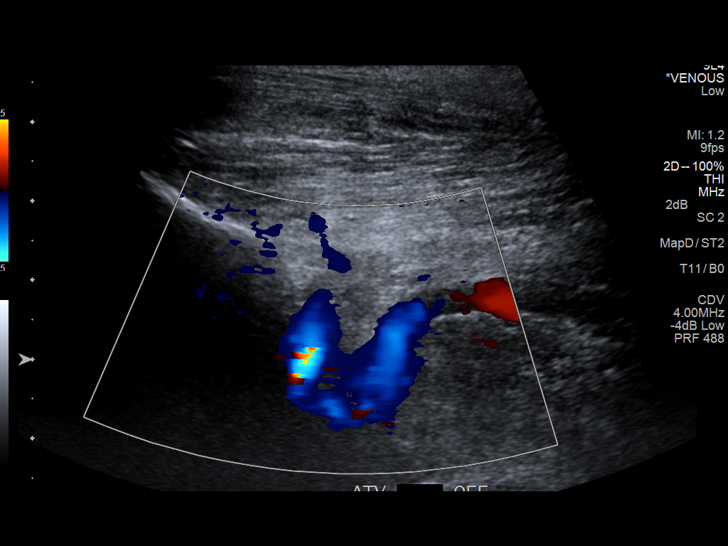

[13 of 24 positions shown; findings below may reference images not displayed]

FINDINGS: Contralateral Common Femoral Vein: Respiratory phasicity is normal
and symmetric with the symptomatic side. No evidence of thrombus.
Normal compressibility.

Common Femoral Vein: No evidence of thrombus. Normal
compressibility, respiratory phasicity and response to augmentation.

Saphenofemoral Junction: No evidence of thrombus. Normal
compressibility and flow on color Doppler imaging.

Profunda Femoral Vein: No evidence of thrombus. Normal
compressibility and flow on color Doppler imaging.

Femoral Vein: No evidence of thrombus. Normal compressibility,
respiratory phasicity and response to augmentation.

Popliteal Vein: No evidence of thrombus. Normal compressibility,
respiratory phasicity and response to augmentation.

Calf Veins: No evidence of thrombus. Normal compressibility and flow
on color Doppler imaging.

Superficial Great Saphenous Vein: No evidence of thrombus. Normal
compressibility and flow on color Doppler imaging.

Other Findings:  Edema of the lower extremity.
IMPRESSION: Sonographic survey of the right lower extremity negative for DVT.

Edema of the lower extremity.

## 2016-02-14 IMAGING — US US EXTREM LOW VENOUS*R*
1 series · 13 of 24 positions shown · non-contrast
Comparison: None.

CLINICAL DATA: Right lower extremity pain and swelling for 3 weeks.



[Series 1: us extrem low venous*right* · 0.10mm/px · 13 of 32 slices shown]
[im 1/32]
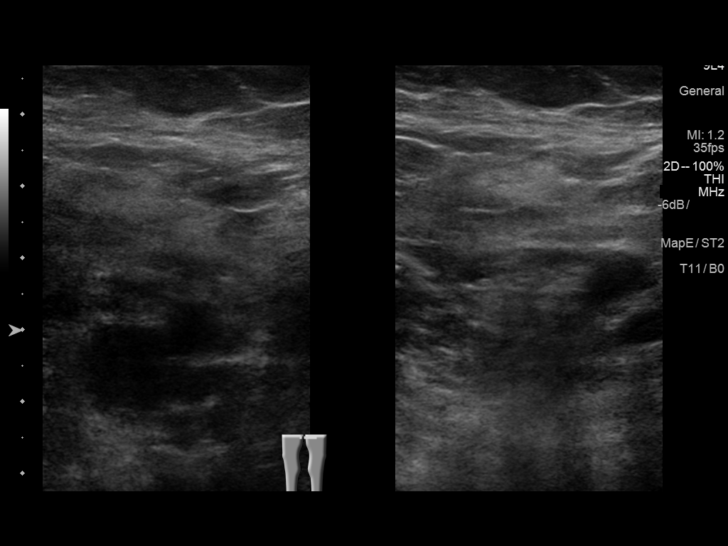
[im 3/32]
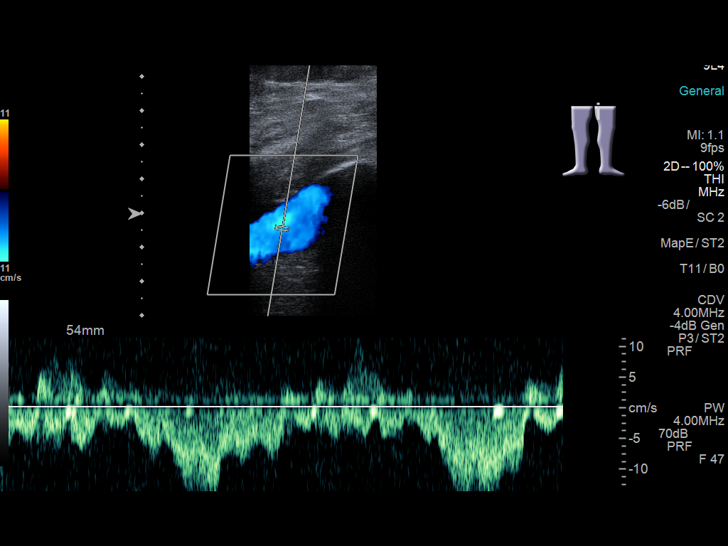
[im 6/32]
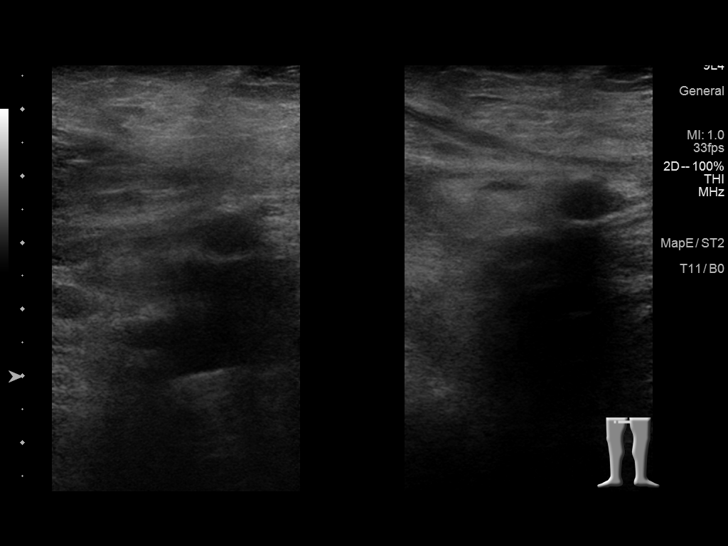
[im 9/32]
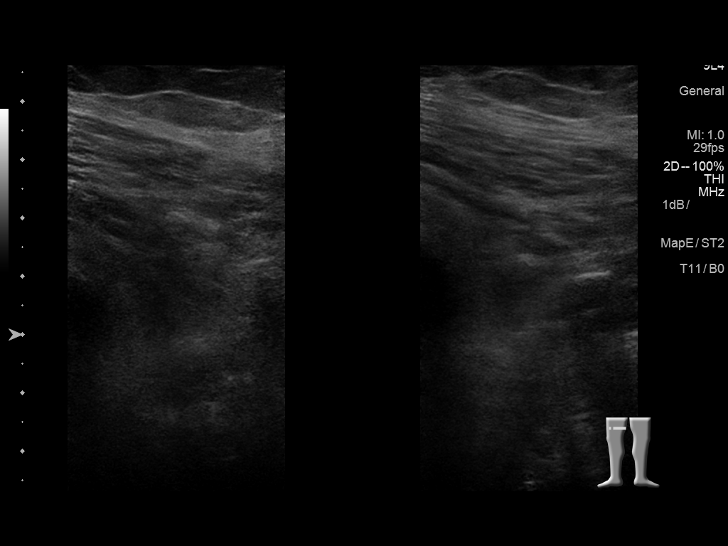
[im 11/32]
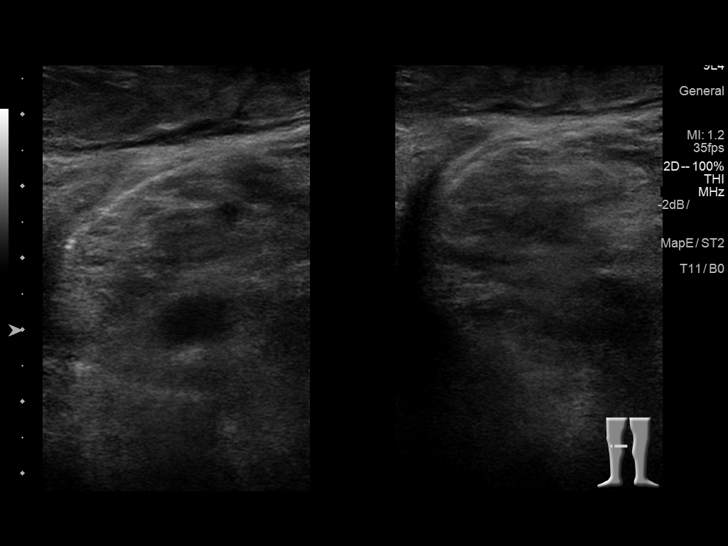
[im 14/32]
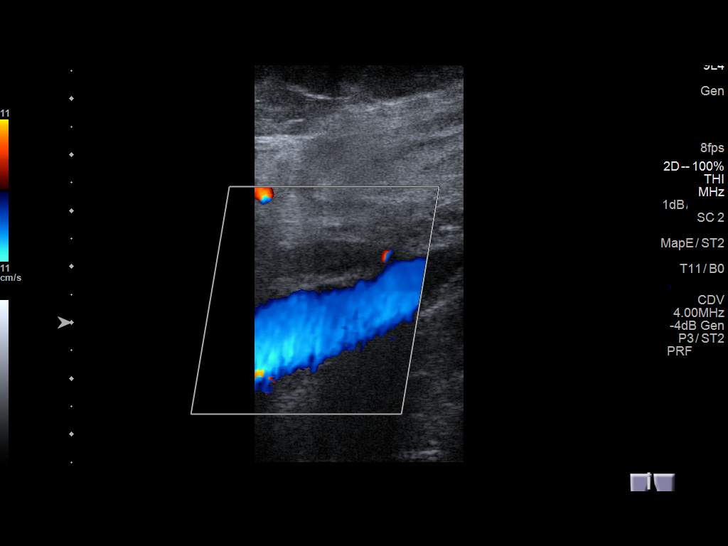
[im 17/32]
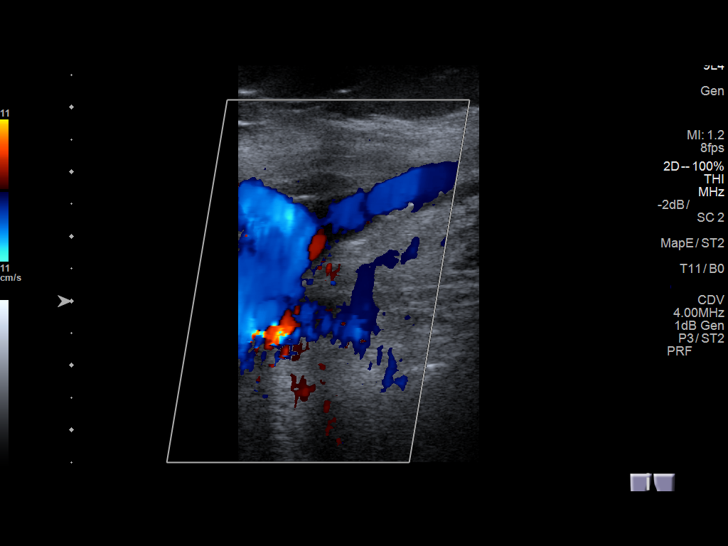
[im 18/32]
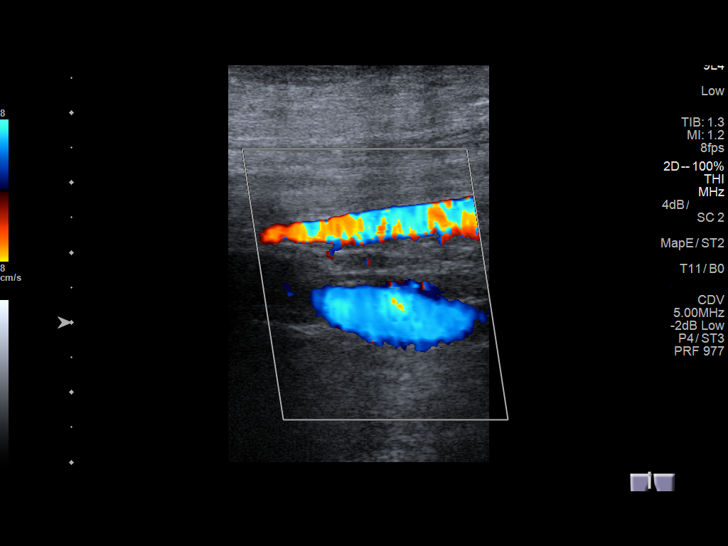
[im 21/32]
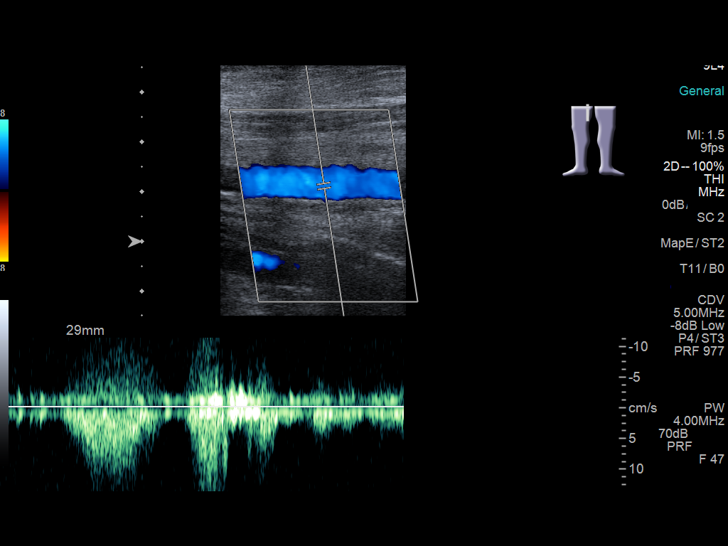
[im 23/32]
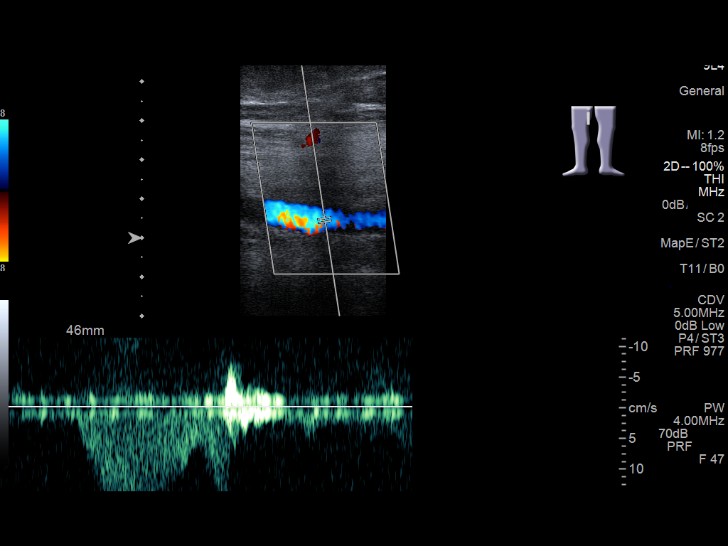
[im 26/32]
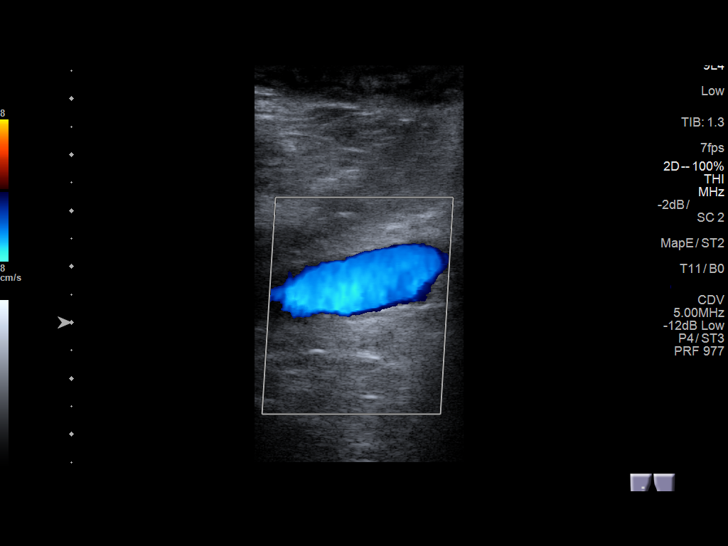
[im 29/32]
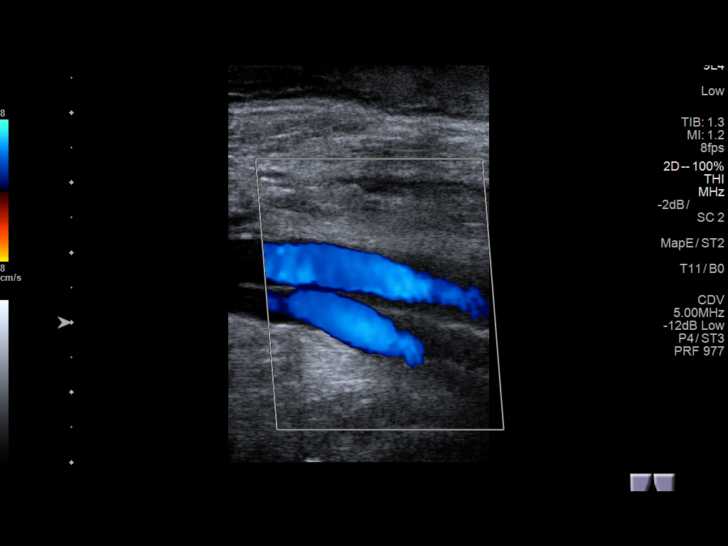
[im 32/32]
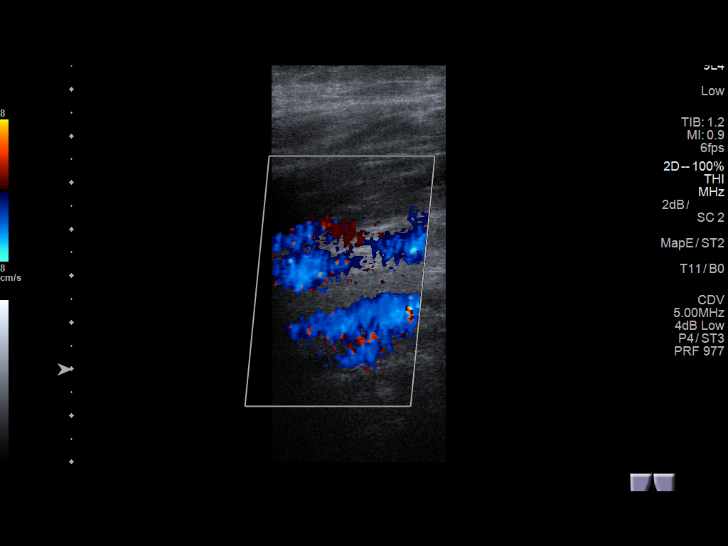

[13 of 24 positions shown; findings below may reference images not displayed]

FINDINGS: Contralateral Common Femoral Vein: Respiratory phasicity is normal
and symmetric with the symptomatic side. No evidence of thrombus.
Normal compressibility.

Common Femoral Vein: No evidence of thrombus. Normal
compressibility, respiratory phasicity and response to augmentation.

Saphenofemoral Junction: No evidence of thrombus. Normal
compressibility and flow on color Doppler imaging.

Profunda Femoral Vein: No evidence of thrombus. Normal
compressibility and flow on color Doppler imaging.

Femoral Vein: No evidence of thrombus. Normal compressibility,
respiratory phasicity and response to augmentation.

Popliteal Vein: No evidence of thrombus. Normal compressibility,
respiratory phasicity and response to augmentation.

Calf Veins: No evidence of thrombus. Normal compressibility and flow
on color Doppler imaging.

Superficial Great Saphenous Vein: No evidence of thrombus. Normal
compressibility and flow on color Doppler imaging.

Venous Reflux:  None.

Other Findings:  None.
IMPRESSION: No evidence of deep venous thrombosis.

## 2016-02-25 IMAGING — CT CT ANGIO CHEST
2 of 6 series · 18 of 36 positions shown · IV contrast (APPLIED)
Comparison: Chest radiograph performed earlier today at [DATE] p.m.,
and CTA of the chest performed 09/09/2013

CLINICAL DATA: Acute onset of mid sternal chest pain. Initial
encounter.

EXAM:
CT ANGIOGRAPHY CHEST WITH CONTRAST
TECHNIQUE: Multidetector CT imaging of the chest was performed using the
standard protocol during bolus administration of intravenous
contrast. Multiplanar CT image reconstructions and MIPs were
obtained to evaluate the vascular anatomy.
CONTRAST:  100 mL of Omnipaque 350 IV contrast

[Series 5: pe 1.0 thins · axial · 0.87mm/px · z∈[-624,-384]mm · 17 of 272 slices shown]
[im 16/272  lung]
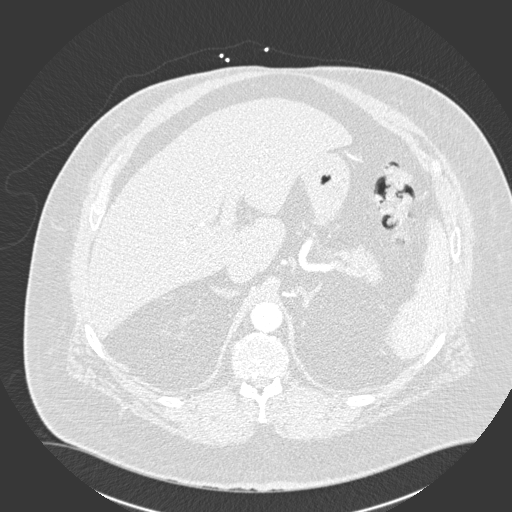
[im 31/272  mediastinal]
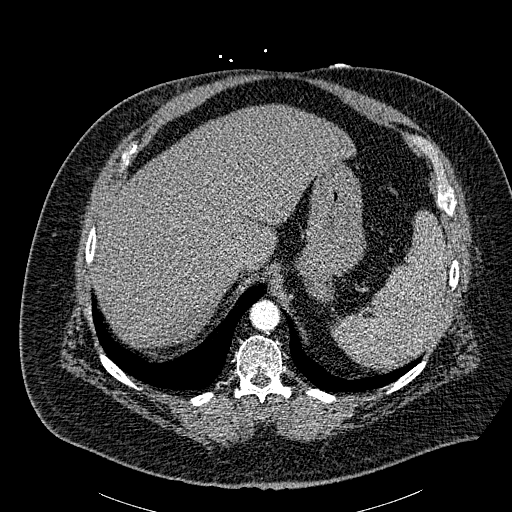
[im 46/272  lung]
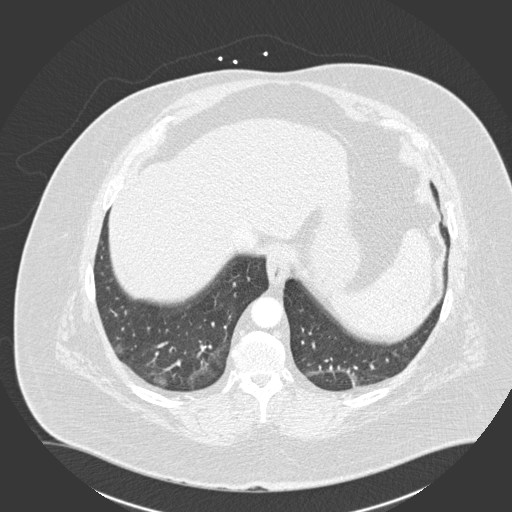
[im 61/272  mediastinal]
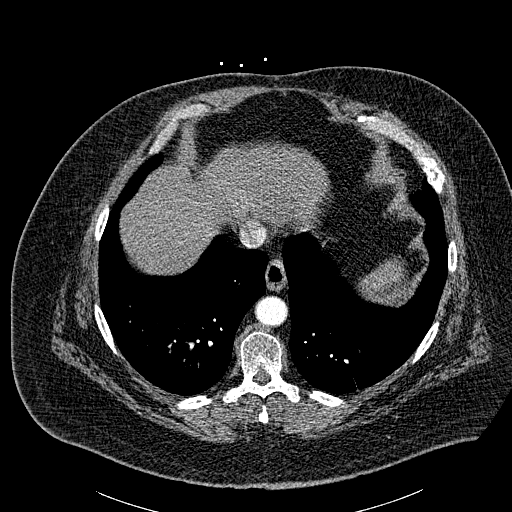
[im 76/272  lung]
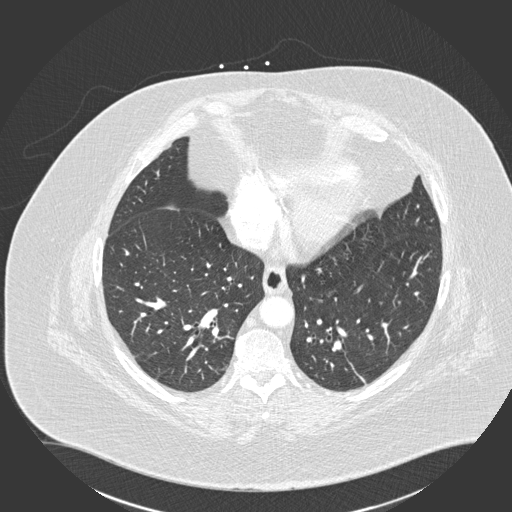
[im 91/272  mediastinal]
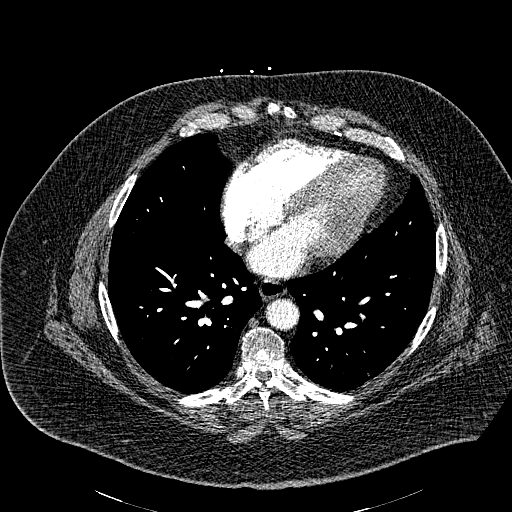
[im 106/272  lung]
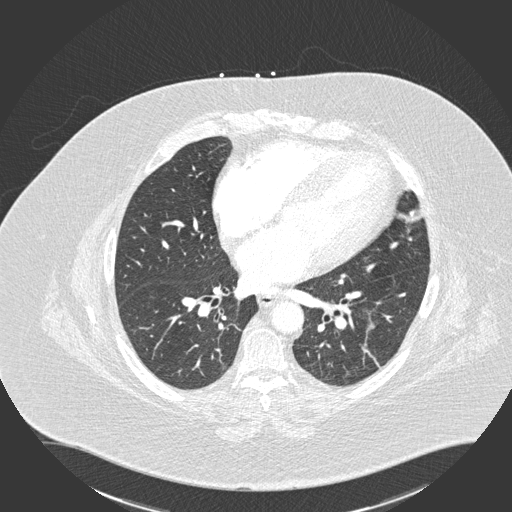
[im 121/272  mediastinal]
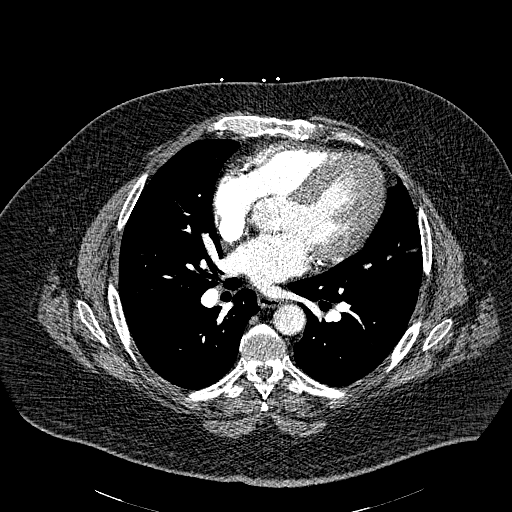
[im 136/272  lung]
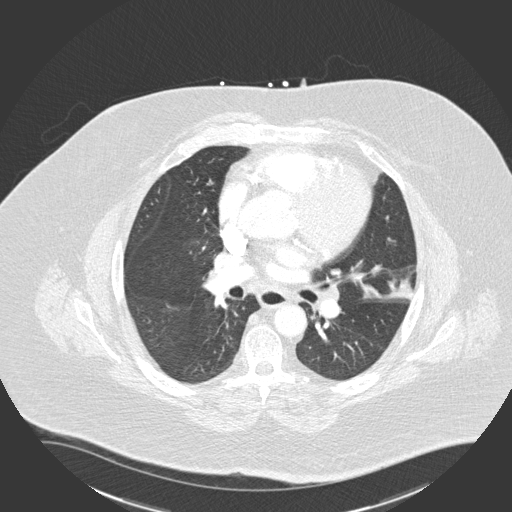
[im 151/272  mediastinal]
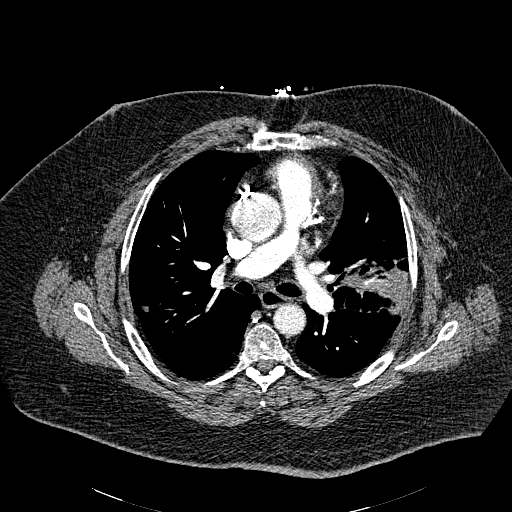
[im 166/272  lung]
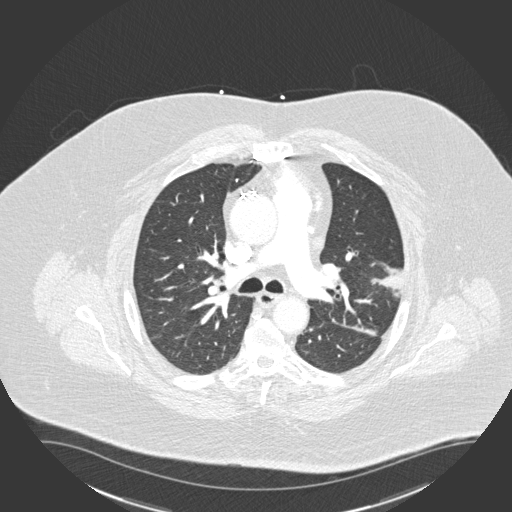
[im 181/272  mediastinal]
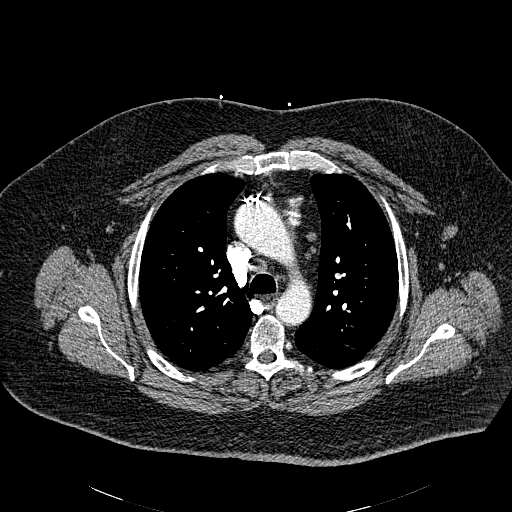
[im 196/272  lung]
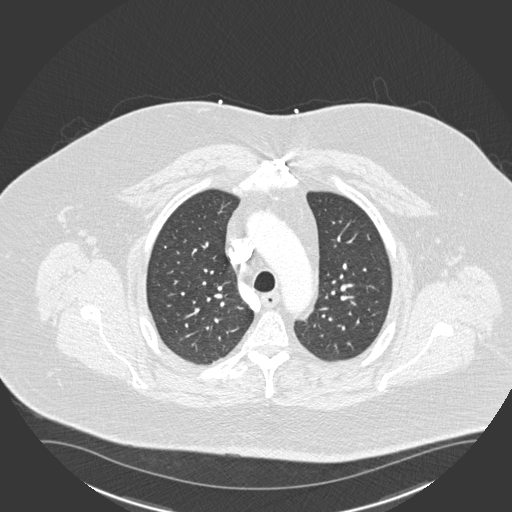
[im 211/272  mediastinal]
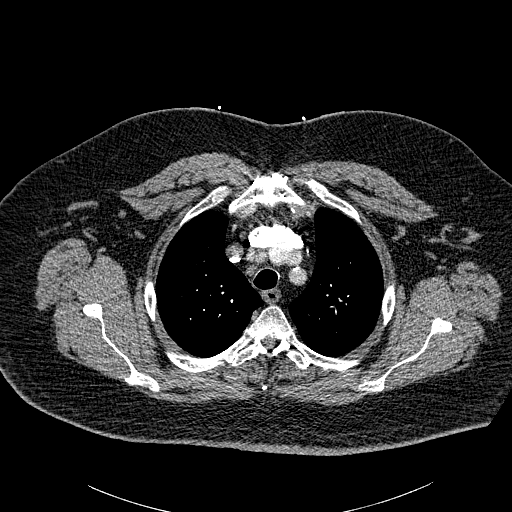
[im 226/272  lung]
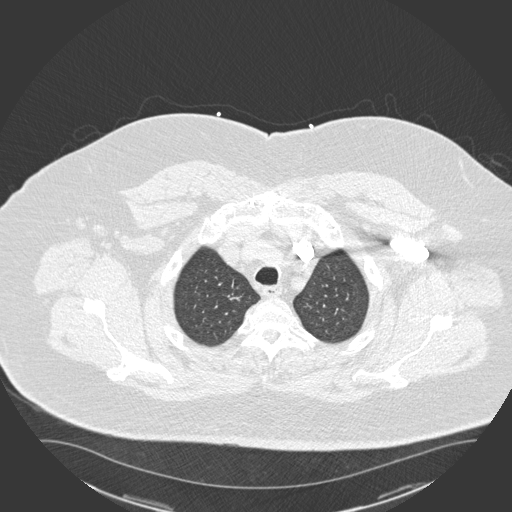
[im 241/272  mediastinal]
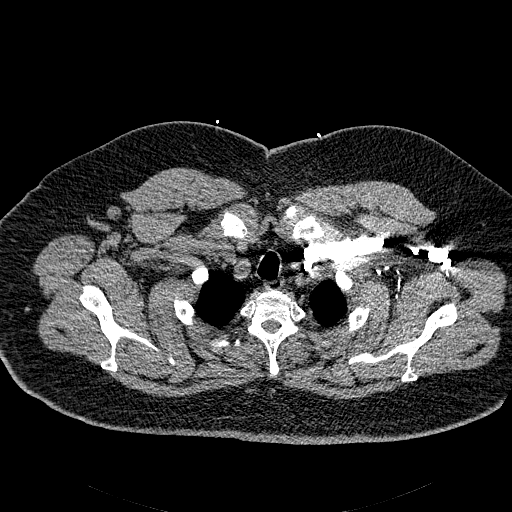
[im 256/272  lung]
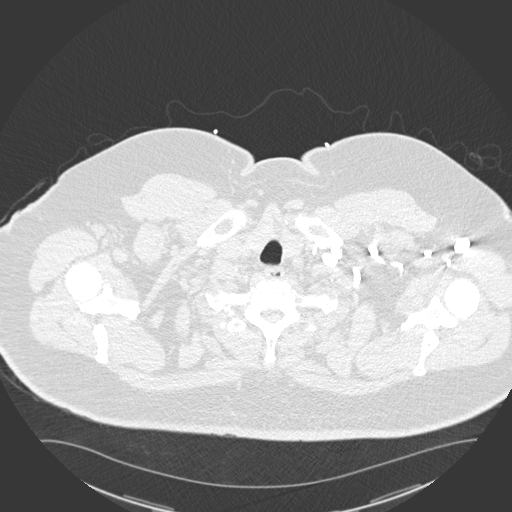

[Series 7: cor pe 2.0 mpr · coronal · 0.58mm/px · 1 of 154 slices shown]
[im 77/154  mediastinal]
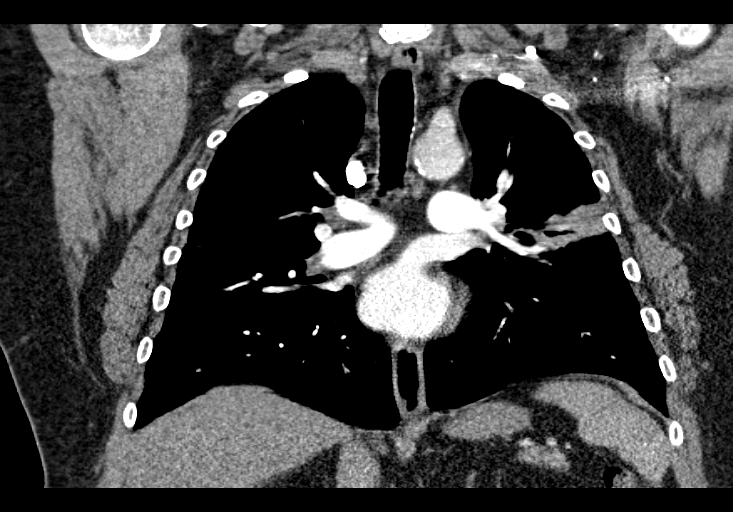

[18 of 36 positions shown; findings below may reference images not displayed]

FINDINGS: There is no evidence of pulmonary embolus.

Focal posterior left upper lobe airspace opacification is compatible
with pneumonia. Minimal bibasilar atelectasis is seen. There is no
evidence of pleural effusion or pneumothorax. No masses are
identified; no abnormal focal contrast enhancement is seen.

The ascending thoracic aorta remains mildly dilated, measuring up to
4.2 cm in AP dimension. This is stable from the prior study. The
patient is status post median sternotomy. Diffuse coronary artery
calcifications are seen. No mediastinal lymphadenopathy is seen. No
pericardial effusion is seen. No axillary lymphadenopathy is seen.
The visualized portions of the thyroid gland are unremarkable in
appearance.

The visualized portions of the liver and spleen are unremarkable.

No acute osseous abnormalities are seen.

Review of the MIP images confirms the above findings.
IMPRESSION: 1. No evidence of pulmonary embolus.
2. Focal posterior left upper lobe airspace opacification,
compatible with pneumonia.
3. Minimal bibasilar atelectasis seen.
4. Stable mild dilatation of the ascending thoracic aorta, measuring
4.2 cm in AP dimension.
5. Diffuse coronary artery calcifications seen.

## 2016-03-10 ENCOUNTER — Ambulatory Visit (INDEPENDENT_AMBULATORY_CARE_PROVIDER_SITE_OTHER): Payer: Commercial Managed Care - HMO | Admitting: Cardiovascular Disease

## 2016-03-10 ENCOUNTER — Encounter: Payer: Self-pay | Admitting: Cardiovascular Disease

## 2016-03-10 VITALS — BP 140/52 | HR 50 | Ht 67.0 in | Wt 277.0 lb

## 2016-03-10 DIAGNOSIS — I350 Nonrheumatic aortic (valve) stenosis: Secondary | ICD-10-CM | POA: Diagnosis not present

## 2016-03-10 DIAGNOSIS — I482 Chronic atrial fibrillation, unspecified: Secondary | ICD-10-CM

## 2016-03-10 MED ORDER — FERROUS SULFATE 325 (65 FE) MG PO TABS: 325.0000 mg | ORAL_TABLET | Freq: Every day | ORAL | 3 refills | Status: DC

## 2016-03-10 MED ORDER — ASPIRIN 81 MG PO TBEC: 81.0000 mg | DELAYED_RELEASE_TABLET | Freq: Every day | ORAL | Status: DC

## 2016-03-10 NOTE — Patient Instructions (Addendum)
Medication Instructions:  Your physician has recommended you make the following change in your medication:  1. DECREASE Aspirin to 81mg  take one tablet by mouth daily 2. DECREASE Iron to once a day  Labwork: No new orders.   Testing/Procedures: Your physician has requested that you have an echocardiogram in 1 YEAR. Echocardiography is a painless test that uses sound waves to create images of your heart. It provides your doctor with information about the size and shape of your heart and how well your heart's chambers and valves are working. This procedure takes approximately one hour. There are no restrictions for this procedure.  Follow-Up: Your physician wants you to follow-up in: 1 YEAR with Dr Burt Knack.  You will receive a reminder letter in the mail two months in advance. If you don't receive a letter, please call our office to schedule the follow-up appointment.   Any Other Special Instructions Will Be Listed Below (If Applicable).     If you need a refill on your cardiac medications before your next appointment, please call your pharmacy.

## 2016-03-10 NOTE — Progress Notes (Signed)
Cardiology Office Note Date:  03/10/2016   ID:  Marcus Williams 21-Feb-1956, MRN [id number]  PCP:  Idelle Crouch, MD  Cardiologist:  Sherren Mocha, MD    Chief Complaint  Patient presents with  . Aortic Stenosis     History of Present Illness: Marcus Williams is a 60 y.o. male who presents for follow-up evaluation. He was initially seen in June 2017 for evaluation of bicuspid valve aortic stenosis.  The patient has an extensive medical history and has been followed closely by Dr Doy Hutching for general medical care and Dr Ubaldo Glassing for cardiac problems. He underwent multivessel CABG at age 88. He's had long-standing obesity. The patient worked until about 4 years ago. He is disabled from cardiac and medical problems. He has had long-standing obstructive sleep apnea and has been on CPAP for 10 years. He has paroxysmal atrial fibrillation and has been maintained on long-term anticoagulation. He denies any history of bleeding problems. He has long-standing diabetes and has been insulin-dependent for approximately 10 years.  The patient is doing much better over the last 2-3 months. He has been off of oxygen, taking diuretics on a regular basis, and breathing is improved. Denies orthopnea or PND. Mild leg swelling but better than past. No chest pain or syncope.   Past Medical History:  Diagnosis Date  . A-fib ([place])   . Aortic stenosis, moderate 11/2015  . Arthritis   . CAD (coronary artery disease)   . Chronic systolic CHF (congestive heart failure) ([place])   . Diabetes mellitus without complication ([place])    INSULIN DEPENDENT  . GERD (gastroesophageal reflux disease)   . History of cardioversion   . Hypertension   . MI (myocardial infarction) ([place])   . OSA on CPAP   . Sepsis ([place])   . Shortness of breath dyspnea   . SVT (supraventricular tachycardia) ([facility])     Past Surgical History:  Procedure Laterality Date  . 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 BUNIONECTOMY    . CARDIAC CATHETERIZATION N/A 10/02/2015   Procedure: Coronary/Grafts Angiography;  Surgeon: Teodoro Spray, MD;  Location: [facility];  Service: Cardiovascular;  Laterality: N/A;  . CARDIAC CATHETERIZATION N/A 11/21/2015   Procedure: Right and Left Heart Cath;  Surgeon: Sherren Mocha, MD;  Location: [facility];  Service: Cardiovascular;  Laterality: N/A;  . CORONARY ARTERY BYPASS GRAFT    . KNEE SURGERY    . quadruple bypass    . TEE WITHOUT CARDIOVERSION  11/21/2015  . TEE WITHOUT CARDIOVERSION N/A 11/21/2015   Procedure: TRANSESOPHAGEAL ECHOCARDIOGRAM (TEE);  Surgeon: Larey Dresser, MD;  Location: [facility] ENDOSCOPY;  Service: Cardiovascular;  Laterality: N/A;    Current Outpatient Prescriptions  Medication Sig Dispense Refill  . albuterol (PROVENTIL HFA;VENTOLIN HFA) 108 (90 Base) MCG/ACT inhaler Inhale 2 puffs into the lungs every 6 (six) hours as needed for wheezing or shortness of breath. 1 Inhaler 10  . ALPRAZolam (XANAX) 0.25 MG tablet Take 0.25 mg by mouth 2 (two) times daily as needed for anxiety.    Marland Kitchen aspirin 325 MG EC tablet Take 325 mg by mouth daily.    Marland Kitchen diltiazem (CARDIZEM CD) 300 MG 24 hr capsule Take 1 capsule (300 mg total) by mouth daily. 30 capsule 5  . docusate sodium (COLACE) 100 MG capsule Take 100 mg by mouth 2 (two) times daily.    Marland Kitchen  ferrous sulfate 325 (65 FE) MG tablet Take 1 tablet (325 mg total) by mouth 2 (two) times daily with a meal. 60 tablet 3  . fluticasone (FLONASE) 50 MCG/ACT nasal spray Place 1 spray into both nostrils daily as needed for allergies.     . Fluticasone-Salmeterol (ADVAIR DISKUS) 250-50 MCG/DOSE AEPB Inhale 1 puff into the lungs 2 (two) times daily as needed (For shortness of breath.).     Marland Kitchen insulin NPH Human (HUMULIN N,NOVOLIN N) 100 UNIT/ML injection Inject 60 Units into the skin 2 (two) times daily.     . meloxicam (MOBIC) 15 MG tablet  Take 15 mg by mouth daily.    . metFORMIN (GLUCOPHAGE) 1000 MG tablet Take 1,000 mg by mouth 2 (two) times daily with a meal.    . metoprolol (LOPRESSOR) 100 MG tablet Take 100 mg by mouth 2 (two) times daily.    . nitroGLYCERIN (NITROSTAT) 0.4 MG SL tablet Place 0.4 mg under the tongue every 5 (five) minutes as needed for chest pain.    . pantoprazole (PROTONIX) 40 MG tablet Take 40 mg by mouth daily.    . potassium chloride 20 MEQ TBCR Take 20 mEq by mouth daily. 34 tablet 6  . Rivaroxaban (XARELTO) 20 MG TABS tablet Take 1 tablet (20 mg total) by mouth daily with breakfast. 34 tablet 6  . simvastatin (ZOCOR) 40 MG tablet Take 40 mg by mouth at bedtime.     . torsemide (DEMADEX) 20 MG tablet Take 3 tablets (60 mg total) by mouth daily. 180 tablet 3  . traZODone (DESYREL) 100 MG tablet Take 100 mg by mouth at bedtime.     No current facility-administered medications for this visit.     Allergies:   Review of patient's allergies indicates not on file.   Social History:  The patient  reports that he has never smoked. He has never used smokeless tobacco. He reports that he does not drink alcohol or use drugs.   Family History:  The patient's  family history includes Diabetes in his brother and brother; Heart attack in his brother, father, and mother; Heart disease in his brother; Hypertension in his brother, father, and mother; Lung disease in his brother; Stroke in his mother.   ROS:  Please see the history of present illness.  Otherwise, review of systems is positive for flank pain.  All other systems are reviewed and negative.   PHYSICAL EXAM: VS:  BP (!) 140/52   Pulse (!) 50   Ht 5\' 7"  (1.702 m)   Wt 277 lb (125.6 kg)   SpO2 95%   BMI 43.38 kg/m  , BMI Body mass index is 43.38 kg/m. GEN: Well nourished, well developed, pleasant obese  in no acute distress  HEENT: normal  Neck: no JVD, no masses. No carotid bruits Cardiac: RRR with 2/6 systolic murmur at the LSB            Respiratory:  clear to auscultation bilaterally, normal work of breathing GI: soft, nontender, nondistended, + BS MS: no deformity or atrophy  Ext: trace pretibial edema, pedal pulses 2+= bilaterally Skin: warm and dry, no rash Neuro:  Strength and sensation are intact Psych: euthymic mood, full affect  EKG:  EKG is not ordered today.  Recent Labs: 03/26/2015: B Natriuretic Peptide 199.0 05/04/2015: ALT 27 [id number]: Magnesium 1.8 11/23/2015: BUN 16; Creatinine, Ser 1.06; Hemoglobin 8.9; Platelets 182; Potassium 3.5; Sodium 138   Lipid Panel     Component Value Date/Time  CHOL 104 07/20/2014 0453   TRIG 131 07/20/2014 0453   HDL 43 07/20/2014 0453   VLDL 26 07/20/2014 0453   LDLCALC 35 07/20/2014 0453      Wt Readings from Last 3 Encounters:  03/10/16 277 lb (125.6 kg)  11/23/15 272 lb 1.6 oz (123.4 kg)  11/16/15 286 lb 1.9 oz (129.8 kg)     Cardiac Studies Reviewed: TEE 11-21-2015: Study Conclusions  - Left ventricle: The cavity size was normal. Wall thickness was   increased increased in a pattern of mild to moderate LVH. The   estimated ejection fraction was 55%. Wall motion was normal;   there were no regional wall motion abnormalities. - Aortic valve: The aortic valve was functionally bicuspid with   fusion of raphus. Mild aortic insufficiency. AVA by planimetry   1.7 cm^2. Mean gradient 20 mmHg with AVA by VTI 1.48 cm^2 => mild   to moderate aortic stenosis. - Aorta: Mildly dilated ascending aorta (4.1 cm). - Mitral valve: There was trivial regurgitation. - Right ventricle: The cavity size was normal. Systolic function   was normal. - Atrial septum: No defect or patent foramen ovale was identified.   Echo contrast study showed no right-to-left atrial level shunt,   at baseline or with provocation.  Impressions:  - Mild to moderate aortic stenosis with functionally bicuspid   aortic valve and mildly dilated ascending aorta.  Cath  10-02-2015: Conclusion    Prox Cx lesion, 100% stenosed.  Prox LAD lesion, 100% stenosed.  Dist RCA lesion, 100% stenosed.  SVG was injected is normal in caliber.  The graft exhibits minimal luminal irregularities.  was injected is normal in caliber.  The graft exhibits minimal luminal irregularities.  SVG was injected is normal in caliber, large.  The graft exhibits minimal luminal irregularities.  LIMA was injected is normal in caliber.  The graft exhibits minimal luminal irregularities.  Mid Graft lesion, 10% stenosed. The lesion was previously treated with a stent (unknown type).  Prox Graft lesion, 30% stenosed.   Three vessel native disease with patent svg to d1, om1 and rpda. Patent lima to lad. Aortic valve bicuspid and moderately stenoses by echo. Not crossed. . WIll reevaluate aortic valve and consider aortic valve intervention if found to be severely stenosed.      CATH 11-21-2015 Conclusion   1. Congestive heart failure with elevated right and left heart filling pressures 2. Moderate pulmonary HTN with mean PA pressure 40 mmHg, trans-pulmonic gradient 13 mmHg, PVR 2 Woods units  3. Moderate aortic stenosis with mean transaortic gradient 11 mmHg  Recommendation:  TEE today  Suspect aortic stenosis can be followed  CHF treatment - consider admission for IV diuresis  Indications   Acute on chronic diastolic CHF (congestive heart failure), NYHA class 3 (HCC) [I50.33 (ICD-10-CM)]  Severe aortic stenosis [I35.0 (XX123456  Complications   Complications documented in old activity   INDICATION: CHF, aortic stenosis. 60 yo male with previous CABG, obesity, aortic stenosis, and heart failure. He has progressive shortness of breath, now on home O2, referred for hemodynamic cath. He has recently undergone coronary angiography demonstrating patency of his bypass grafts.   PROCEDURAL DETAILS: There was an indwelling IV in a right antecubital vein. Using normal  sterile technique, the IV was changed out for a 5 Fr brachial sheath over a 0.018 inch wire. The right wrist was then prepped, draped, and anesthetized with 1% lidocaine. Using the modified Seldinger technique a 5/6 French Slender sheath was placed in the  right radial artery. Intra-arterial verapamil was administered through the radial artery sheath. IV heparin was administered after an AL-1 catheter was advanced into the central aorta. A Swan-Ganz catheter was used for the right heart catheterization. Standard protocol was followed for recording of right heart pressures and sampling of oxygen saturations. Fick cardiac output was calculated. The AL-1 catheter was used to direct an exchange length J-wire across the aortic valve and the AL-1 was used to record LV pressure and perform a pullback gradient across the aortic valve. There were no immediate procedural complications. The patient was transferred to the post catheterization recovery area for further monitoring.   During this procedure the patient is administered a total of Versed 1 mg and Fentanyl 25 mg to achieve and maintain moderate conscious sedation. The patient's heart rate, blood pressure, and oxygen saturation are monitored continuously during the procedure. The period of conscious sedation is 28 minutes, of which I was present face-to-face 100% of this time.   Estimated blood loss <50 mL. There were no immediate complications during the procedure.      Right Heart   Right Heart Pressures Hemodynamic findings consistent with pulmonary hypertension.    Implants     No implant documentation for this case.  PACS Images   Show images for Cardiac catheterization   Link to Procedure Log   Procedure Log    Hemo Data   Flowsheet Row Most Recent Value  Fick Cardiac Output 6.03 L/min  Fick Cardiac Output Index 2.59 (L/min)/BSA  Aortic Mean Gradient 10.9 mmHg  Aortic Peak Gradient 9 mmHg  Aortic Valve Area 2.51  Aortic Value Area  Index 1.08 cm2/BSA  RA A Wave 22 mmHg  RA V Wave 19 mmHg  RA Mean 17 mmHg  RV Systolic Pressure 60 mmHg  RV Diastolic Pressure 11 mmHg  RV EDP 21 mmHg  PA Systolic Pressure 60 mmHg  PA Diastolic Pressure 28 mmHg  PA Mean 40 mmHg  PW A Wave 31 mmHg  PW V Wave 41 mmHg  PW Mean 27 mmHg  AO Systolic Pressure [id number] mmHg  AO Diastolic Pressure 62 mmHg  AO Mean 91 mmHg  LV Systolic Pressure 123XX123 mmHg  LV Diastolic Pressure 14 mmHg  LV EDP 32 mmHg  Arterial Occlusion Pressure Extended Systolic Pressure 0000000 mmHg  Arterial Occlusion Pressure Extended Diastolic Pressure 61 mmHg  Arterial Occlusion Pressure Extended Mean Pressure 90 mmHg  Left Ventricular Apex Extended Systolic Pressure 0000000 mmHg  Left Ventricular Apex Extended Diastolic Pressure 13 mmHg  Left Ventricular Apex Extended EDP Pressure 29 mmHg  QP/QS 1.05  TPVR Index 14.73 HRUI  TSVR Index 35.16 HRUI  PVR SVR Ratio 0.14  TPVR/TSVR Ratio 0.42    ASSESSMENT AND PLAN: 1.  Bicuspid aortic valve stenosis, mild-moderate by TEE and cath hemodynamics. Continue yearly echo FU. Pt appears stable.  2. Chronic diastolic heart failure, NYHA II: stable on diuretic Rx, discussed sodium restriction, daily weights.   3. PAF - on Xarelto for anticoagulation. Will decrease ASA to 81 mg.  4. CAD s/p CABG: no anginal symptoms. On ASA, statin drug.   5. Type II DM: per Dr Doy Hutching. Medications reviewed.   Current medicines are reviewed with the patient today.  The patient does not have concerns regarding medicines.  Labs/ tests ordered today include:  No orders of the defined types were placed in this encounter.  Disposition:   [facility] one year with an echo.  Deatra James, MD  03/10/2016 10:04 AM  [facility] [place], [place], [place]  [address] Phone: [phone]; Fax: [phone]

## 2016-03-15 IMAGING — CT CT ANGIO CHEST
2 of 6 series · 18 of 36 positions shown · IV contrast (omnipaque)
Comparison: 06/30/2014

CLINICAL DATA: fell and had Lt rib FX. HX SVT, Gerd, Cabg x4, HTN,
C/o SOBx more than a month.

EXAM:
CT ANGIOGRAPHY CHEST WITH CONTRAST
TECHNIQUE: Multidetector CT imaging of the chest was performed using the
standard protocol during bolus administration of intravenous
contrast. Multiplanar CT image reconstructions and MIPs were
obtained to evaluate the vascular anatomy.
CONTRAST:  100 mL Omnipaque 350 IV

[Series 5: pe 1.0 thins · axial · 0.84mm/px · z∈[-119,+145]mm · 17 of 298 slices shown]
[im 17/298  lung]
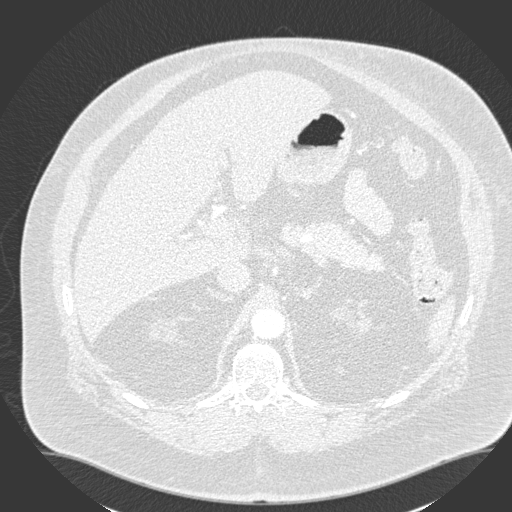
[im 34/298  mediastinal]
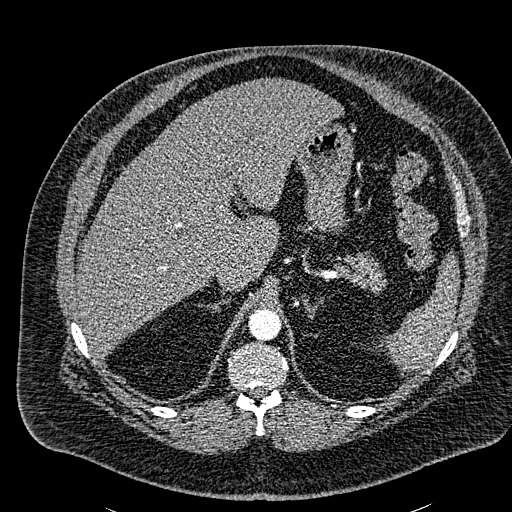
[im 50/298  lung]
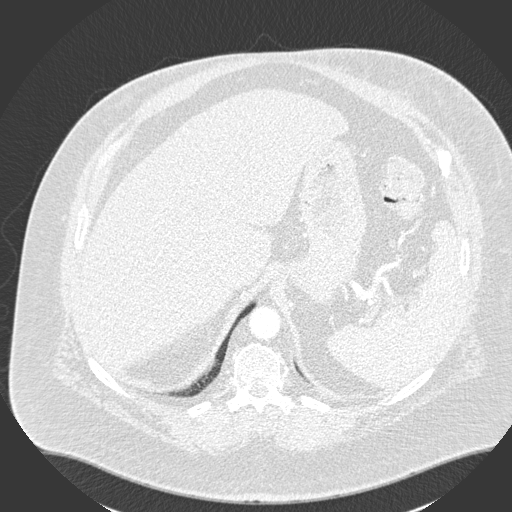
[im 67/298  mediastinal]
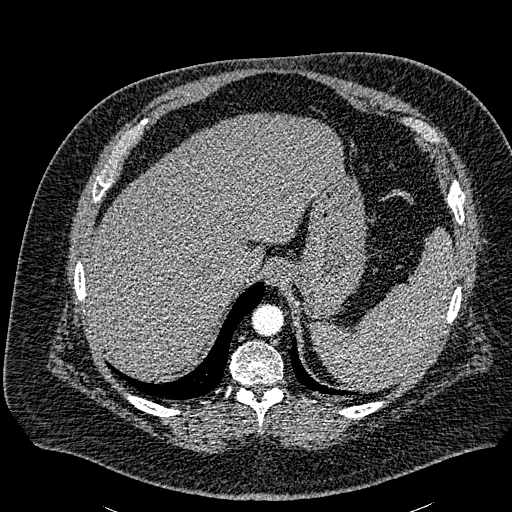
[im 83/298  lung]
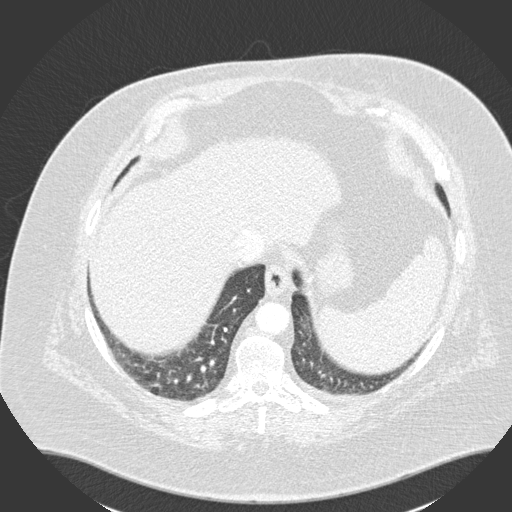
[im 100/298  mediastinal]
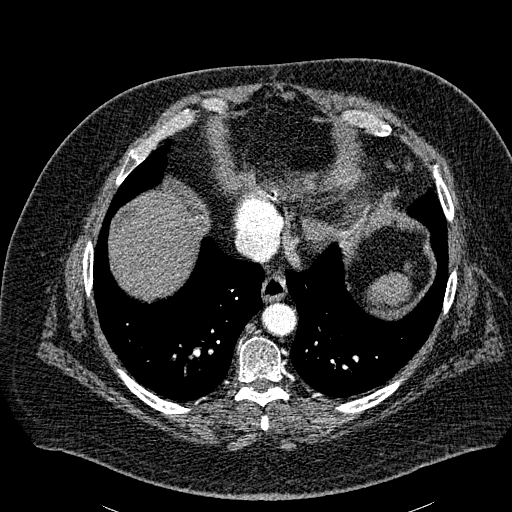
[im 116/298  lung]
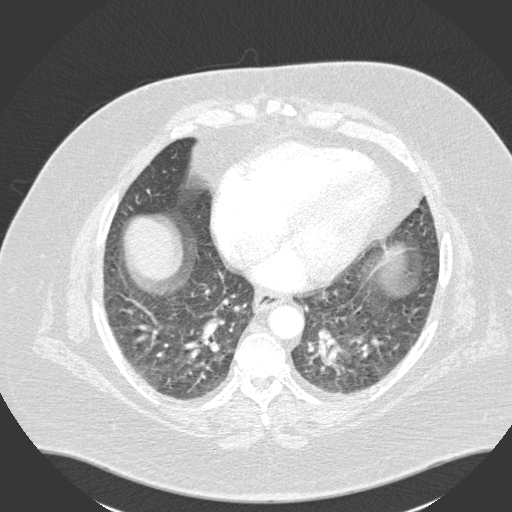
[im 133/298  mediastinal]
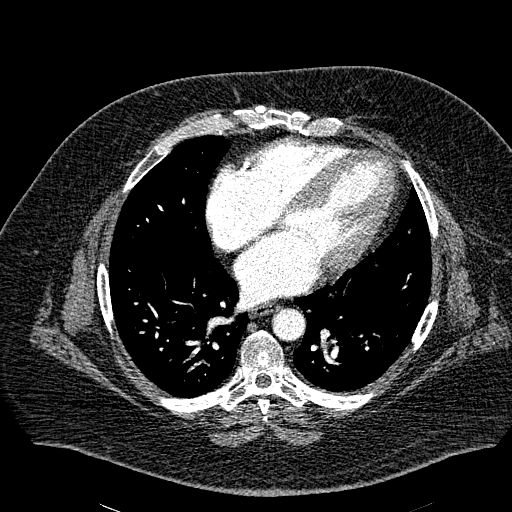
[im 149/298  lung]
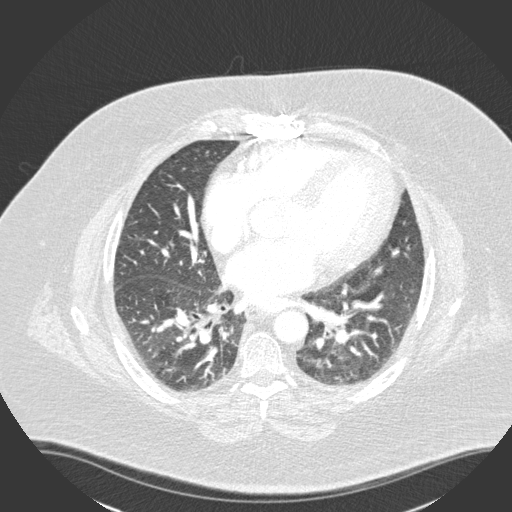
[im 166/298  mediastinal]
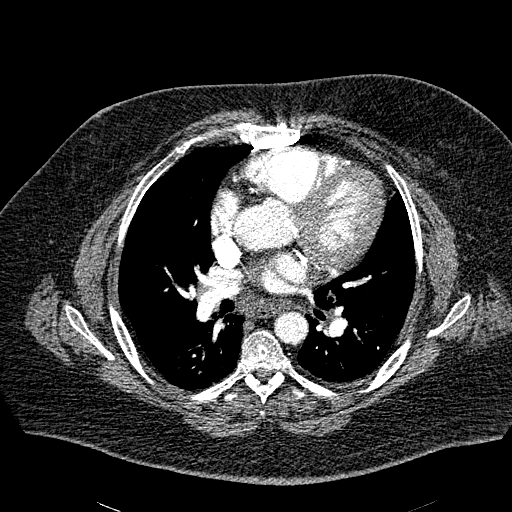
[im 182/298  lung]
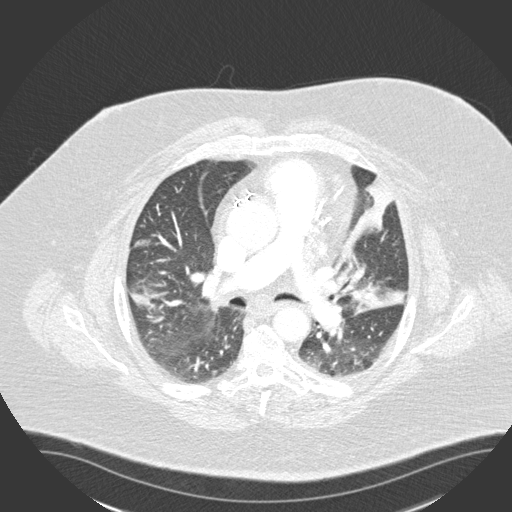
[im 199/298  mediastinal]
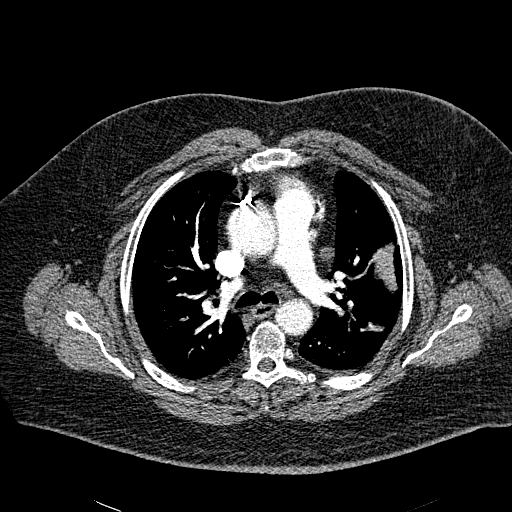
[im 215/298  lung]
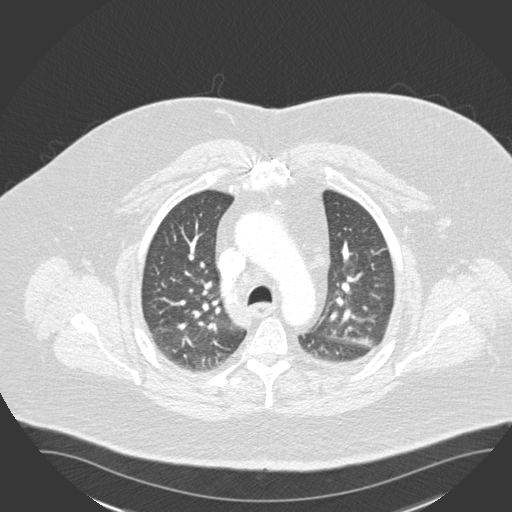
[im 232/298  mediastinal]
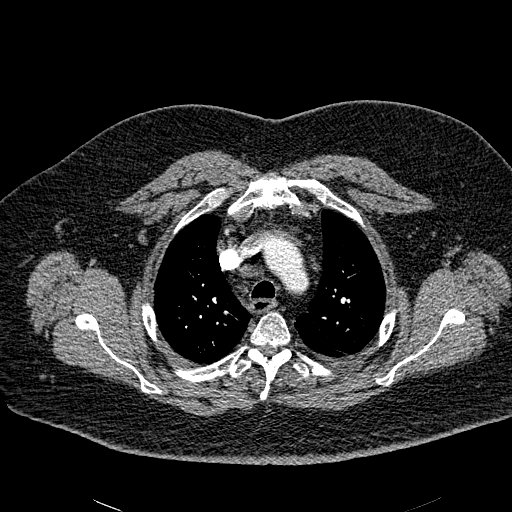
[im 248/298  lung]
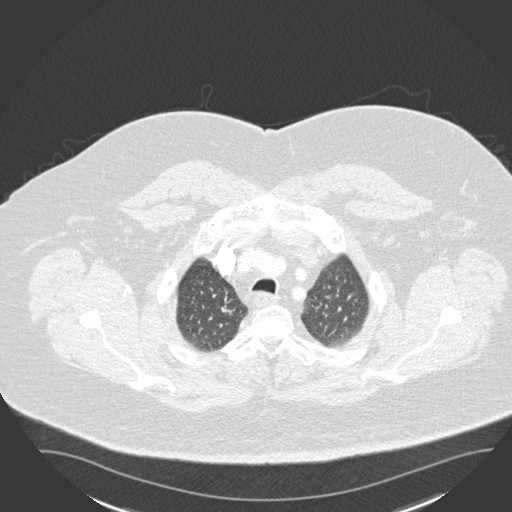
[im 265/298  mediastinal]
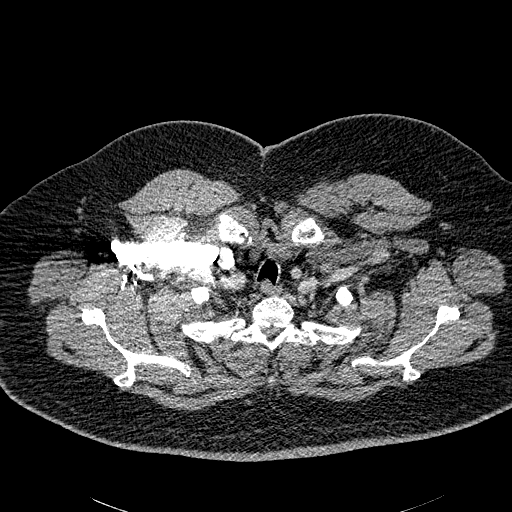
[im 281/298  lung]
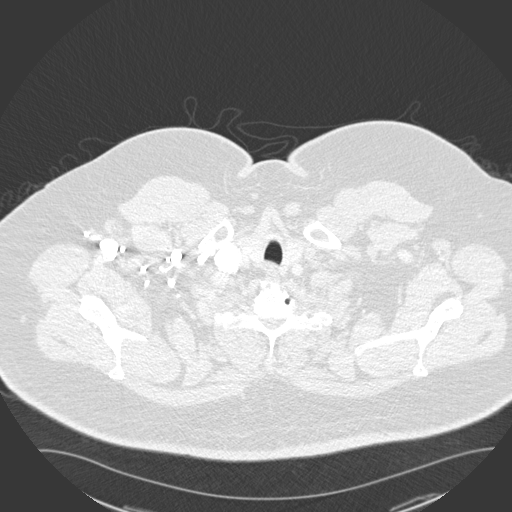

[Series 7: cor pe 2.0 mpr · coronal · 0.68mm/px · 1 of 170 slices shown]
[im 85/170  mediastinal]
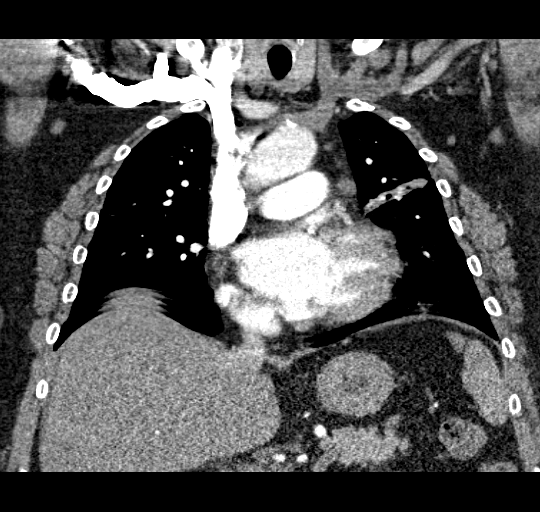

[18 of 36 positions shown; findings below may reference images not displayed]

FINDINGS: Previous median sternotomy and CABG. The SVC is patent. RV/LV ratio
less than 1, normal. Satisfactory opacification of pulmonary
arteries noted, and there is no evidence of pulmonary emboli.
Patient breathing during the acquisition degrades some of the
images. Patent superior and inferior pulmonary veins bilaterally.
Adequate contrast opacification of the thoracic aorta with no
evidence of dissection, aneurysm, or stenosis. There is bovine
variant brachiocephalic arch anatomy without proximal stenosis.

No pleural or pericardial effusion. No pneumothorax. 11 mm
prevascular lymph node, previously 10 mm. Additional subcentimeter
prevascular, precarinal, and right paratracheal nodes. No hilar
adenopathy. Stable linear scarring/ atelectasis laterally in the
right upper lobe. Some increase in anterior left upper lobe
platelike atelectasis . No evidence central obstructing lesion.
Degenerative changes in the visualized lower cervical spine. Healing
fractures of the anterolateral aspect left sixth, seventh, and
eighth ribs. Prominent celiac lymph nodes, stable since 03/11/2014.
Remainder visualized upper abdomen unremarkable.

Review of the MIP images confirms the above findings.
IMPRESSION: 1. Negative for acute PE  or thoracic aortic dissection.
2. Worsening anterior left upper lobe atelectasis.
3. Healing left rib fractures.

## 2016-03-15 IMAGING — CR DG CHEST 1V PORT
1 series · 1 of 1 positions shown · non-contrast
Comparison: Prior chest x-ray 06/30/2014 ; CT scan of the chest
performed earlier today.

CLINICAL DATA: 68-year-old male with chest pain and recent
diagnosis of pneumonia

EXAM:
PORTABLE CHEST - 1 VIEW

[ap]
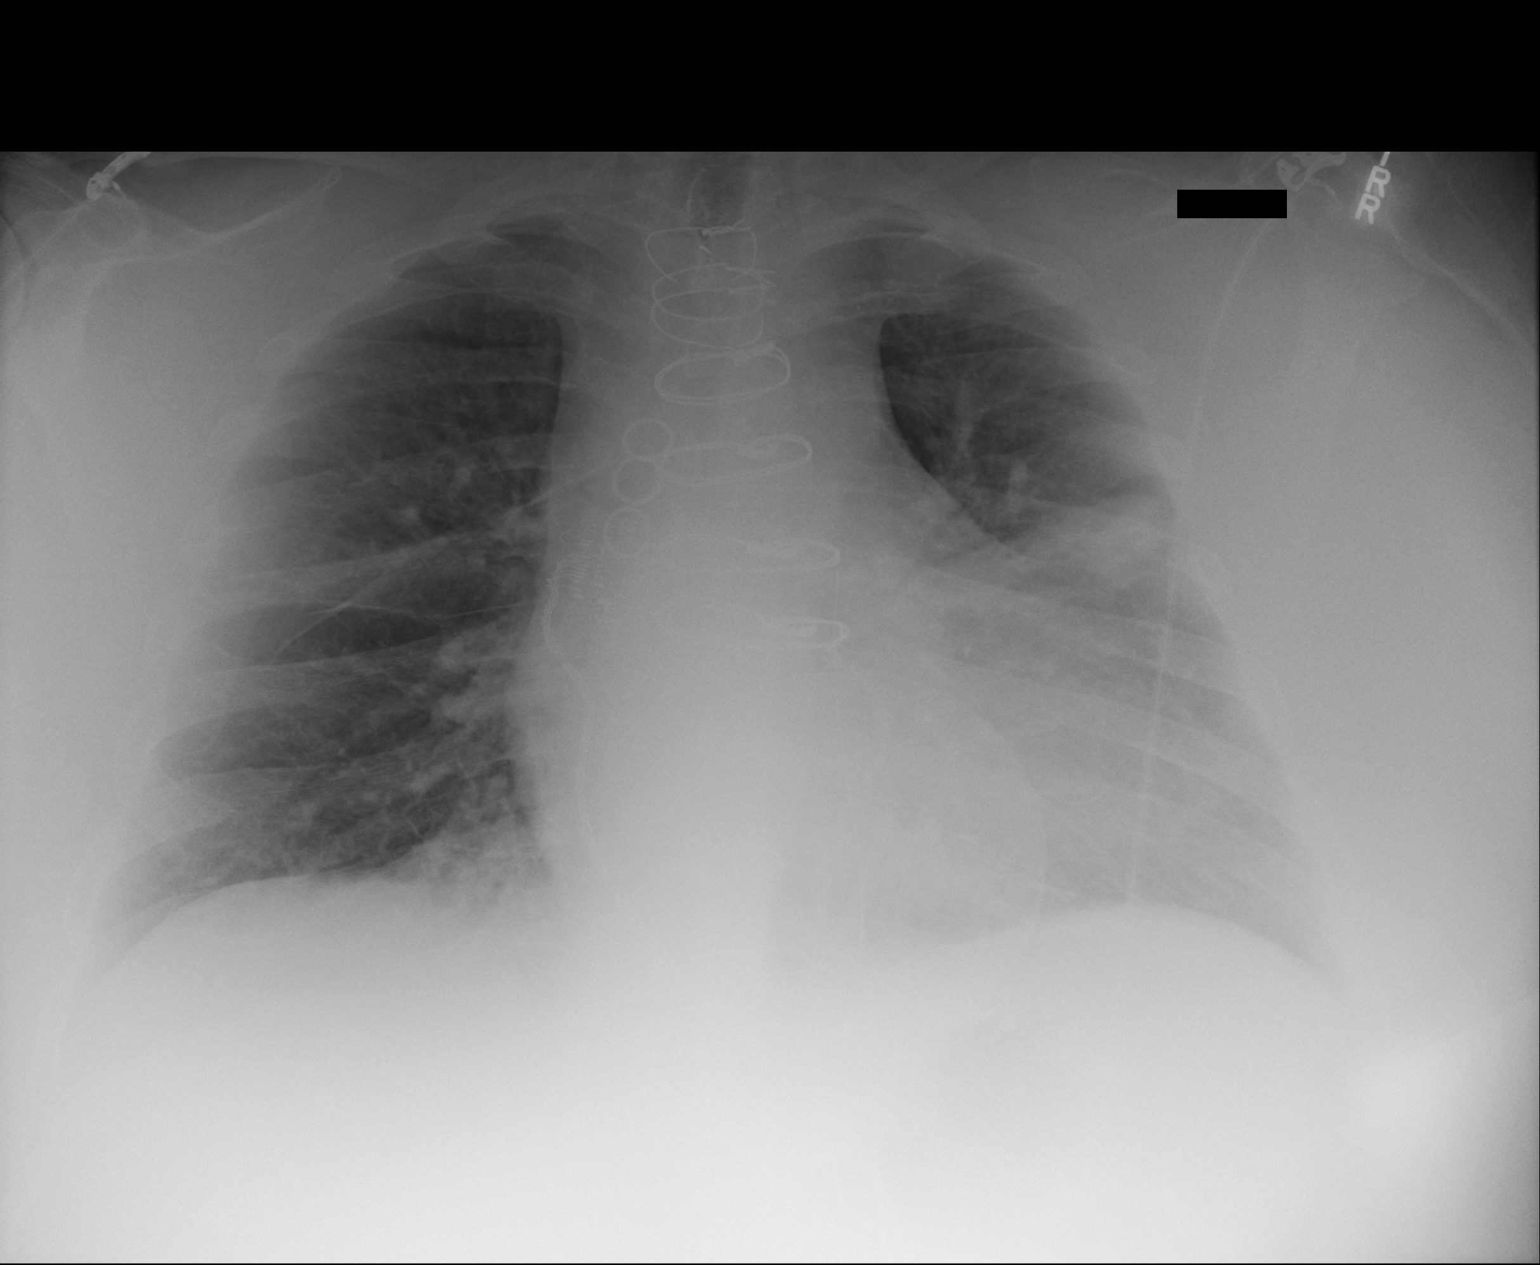

[1 of 1 positions shown; findings below may reference images not displayed]

FINDINGS: Stable cardiac and mediastinal contours. There is mild cardiomegaly.
Patient is status post median sternotomy with evidence of prior
multivessel CABG.

Linear opacity in the left mid lung consistent with atelectasis as
seen on the recent CT scan. Additionally, there is faint linear
atelectasis versus scarring along the right minor fissure. No
pleural effusion or pneumothorax. No pulmonary edema. No acute
osseous abnormality.
IMPRESSION: Stable chest x-ray with linear atelectasis versus scarring in the
anterior left upper lobe and along the right minor fissure.

Stable cardiomegaly.

## 2016-03-17 IMAGING — CR DG CHEST 1V PORT
1 series · 1 of 1 positions shown · non-contrast
Comparison: None.

CLINICAL DATA: Sepsis.  AFib.  Shortness of breath.

EXAM:
PORTABLE CHEST - 1 VIEW

[ap]
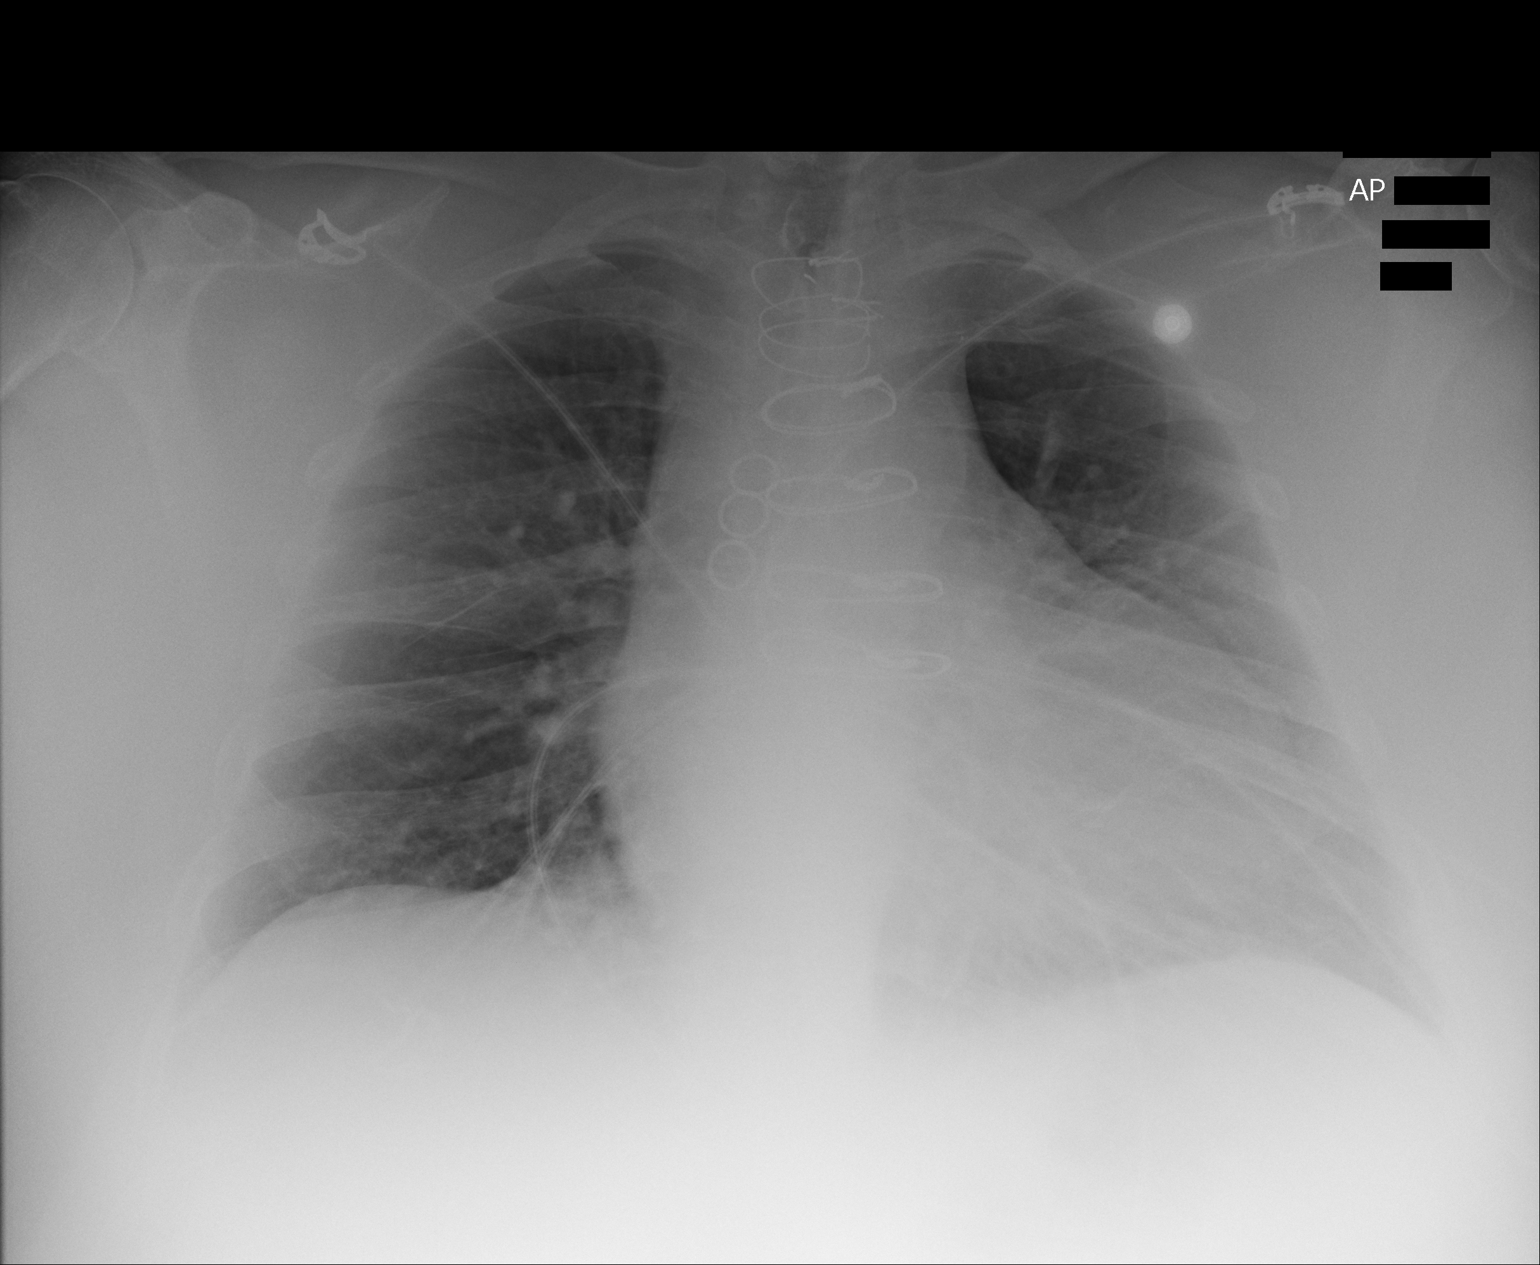

[1 of 1 positions shown; findings below may reference images not displayed]

FINDINGS: Mediastinum and hilar structures normal. Cardiomegaly. Prior CABG.
Normal pulmonary vascularity. Interim partial clearing of left mid
lung and right base infiltrates. No pleural effusion or
pneumothorax.
IMPRESSION: 1. Interim partial clearing of left mid lung and right base
pulmonary infiltrates.

2. Stable cardiomegaly. Prior CABG. No pulmonary venous congestion.

## 2016-03-18 ENCOUNTER — Other Ambulatory Visit: Payer: Self-pay | Admitting: Internal Medicine

## 2016-03-18 DIAGNOSIS — R070 Pain in throat: Secondary | ICD-10-CM | POA: Diagnosis not present

## 2016-03-18 DIAGNOSIS — R1012 Left upper quadrant pain: Secondary | ICD-10-CM | POA: Diagnosis not present

## 2016-03-18 DIAGNOSIS — R0602 Shortness of breath: Secondary | ICD-10-CM | POA: Diagnosis not present

## 2016-03-18 DIAGNOSIS — L03116 Cellulitis of left lower limb: Secondary | ICD-10-CM | POA: Diagnosis not present

## 2016-03-18 DIAGNOSIS — Z79899 Other long term (current) drug therapy: Secondary | ICD-10-CM | POA: Diagnosis not present

## 2016-03-18 DIAGNOSIS — R109 Unspecified abdominal pain: Secondary | ICD-10-CM | POA: Diagnosis not present

## 2016-03-19 IMAGING — CR DG CHEST 1V PORT
1 series · 1 of 1 positions shown · non-contrast
Comparison: 07/21/2014

CLINICAL DATA: Fall, left chest pain, history of pneumonia

EXAM:
PORTABLE CHEST - 1 VIEW

[ap]
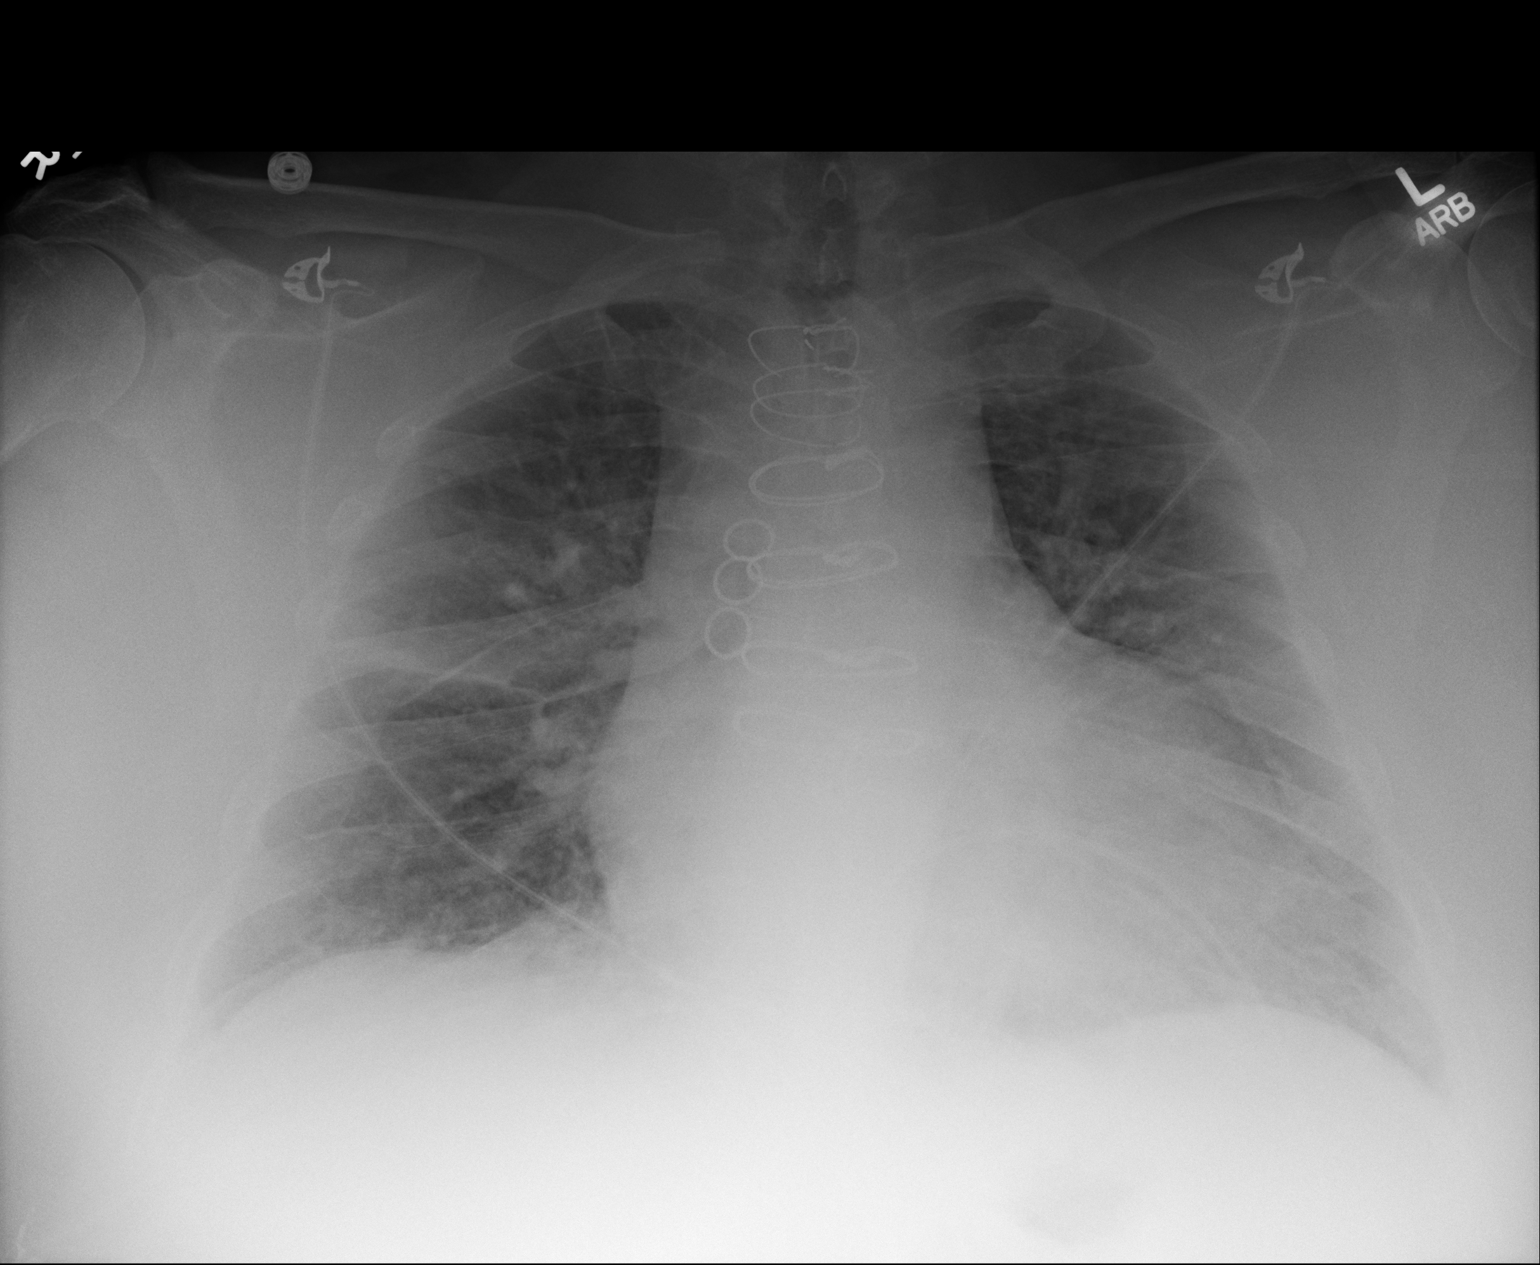

[1 of 1 positions shown; findings below may reference images not displayed]

FINDINGS: Increasing right upper lobe opacity, suspicious for pneumonia.

Cardiomegaly with pulmonary vascular congestion. No frank
interstitial edema. No pneumothorax.

Postsurgical changes related to prior CABG.
IMPRESSION: Increasing right upper lobe opacity, suspicious for pneumonia.

## 2016-03-21 IMAGING — CR DG CHEST 2V
1 series · 2 of 2 positions shown · non-contrast
Comparison: Prior chest x-ray 07/23/2014

CLINICAL DATA: 58-year-old male with shortness of breath

EXAM:
CHEST  2 VIEW

[Series 1: dxr chest pa (or ap) and lateral · 0.14mm/px · 2 of 2 slices shown]
[im 1/2]
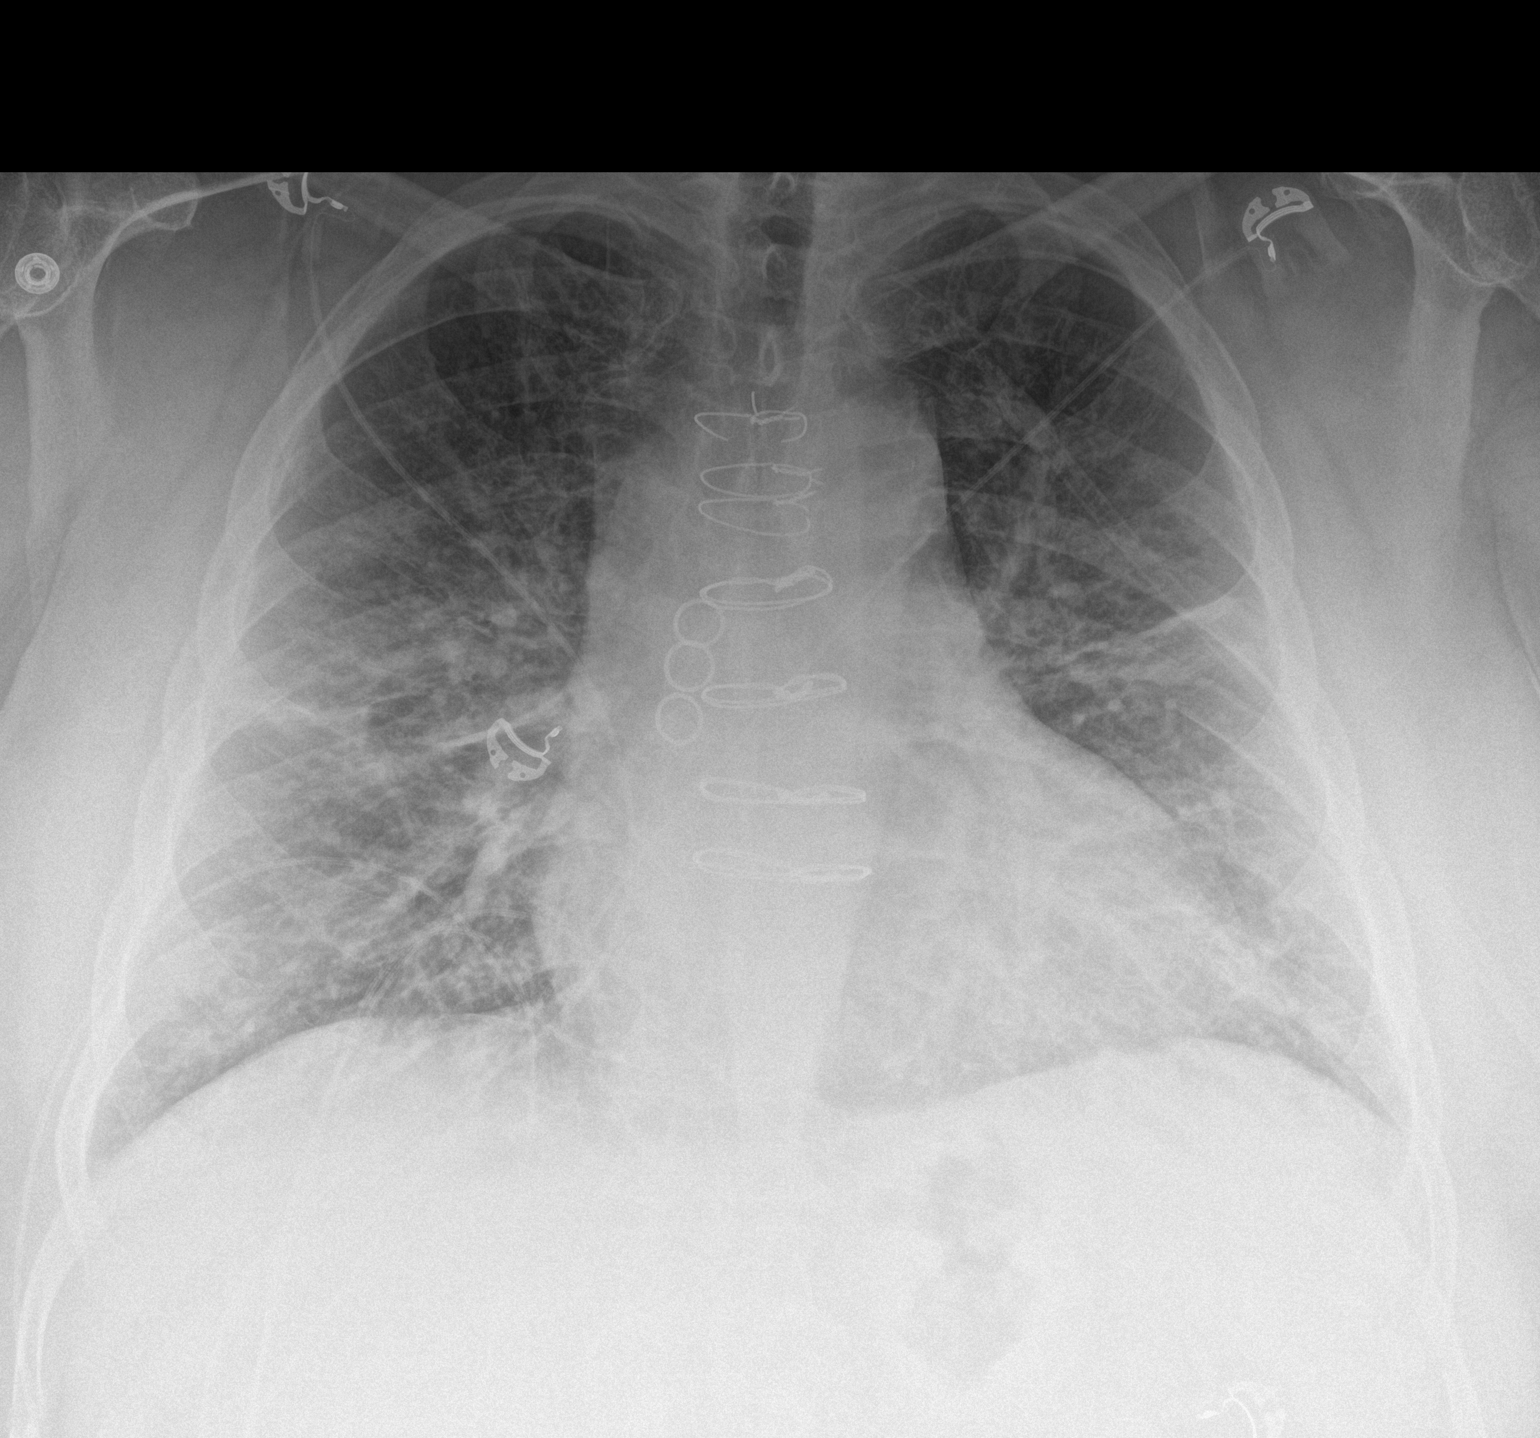
[im 2/2]
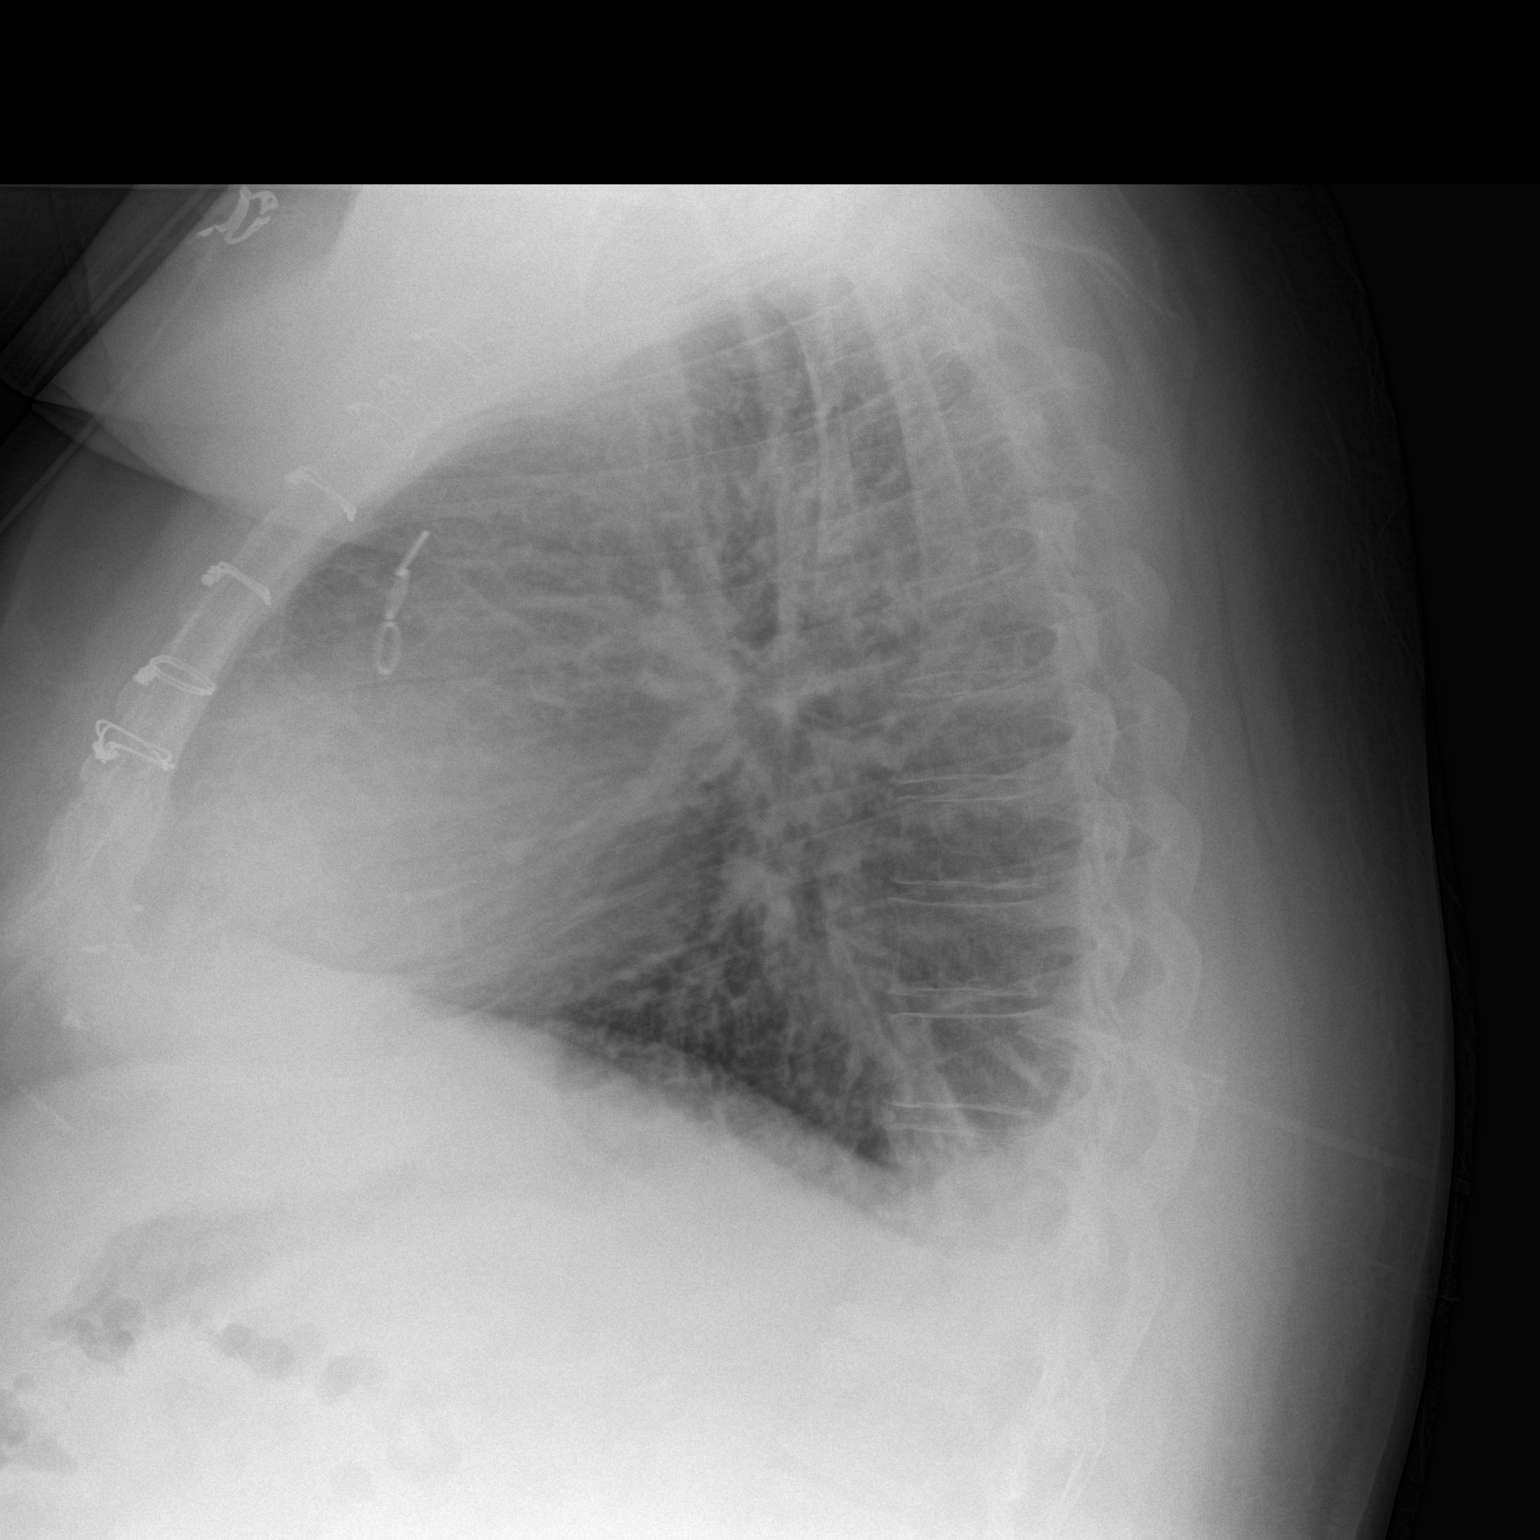

[2 of 2 positions shown; findings below may reference images not displayed]

FINDINGS: Stable cardiomegaly and mediastinal contours. Patient is status post
median sternotomy with evidence of prior multivessel CABG. Interval
progression of diffuse bilateral interstitial and airspace
opacities. The appearance is most suggestive of a pulmonary edema.
The underlying right upper lobe opacity seen on the prior radiograph
is now largely obscured by the superimposed edema. Small bilateral
pleural effusions and associated bibasilar atelectasis. No
pneumothorax.
IMPRESSION: 1. Interval development of pulmonary edema suggesting either CHF or
volume overload.
2. Small bilateral layering pleural effusions.
3. Previously noted increasing right upper lobe opacities in a
largely obscured by the superimposed edema.

## 2016-03-24 IMAGING — CT CT HEAD W/O CM
2 series · 16 of 30 positions shown, 20 images · non-contrast
Comparison: None.

CLINICAL DATA: Increasing lethargy

EXAM:
CT HEAD WITHOUT CONTRAST
TECHNIQUE: Contiguous axial images were obtained from the base of the skull
through the vertex without intravenous contrast.

[Series 201: head w/o, idose (1) · axial · non-contrast · 0.43mm/px · z∈[+68,+208]mm · 13 of 34 slices shown, 17 images]
[im 3/34  brain]
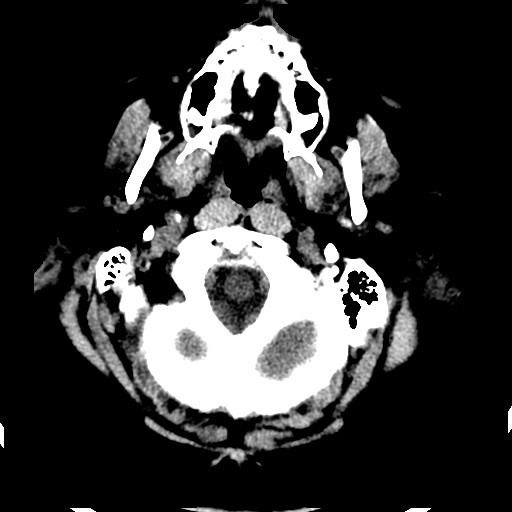
[im 3/34  bone]
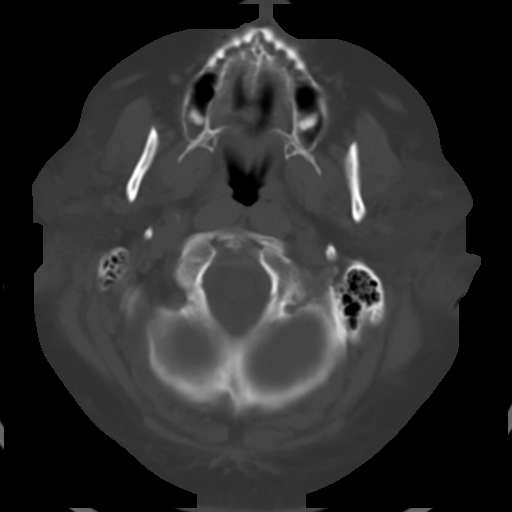
[im 5/34  brain]
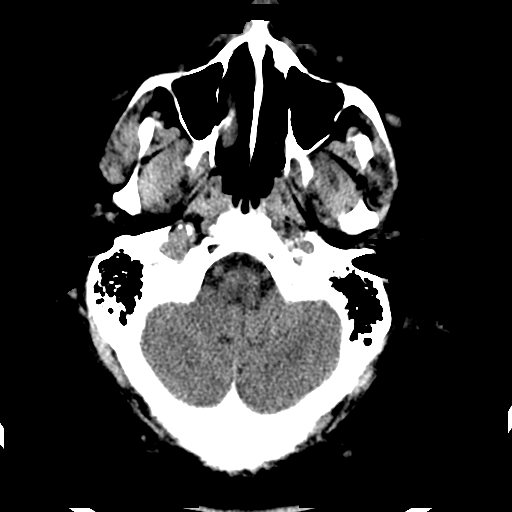
[im 8/34  brain]
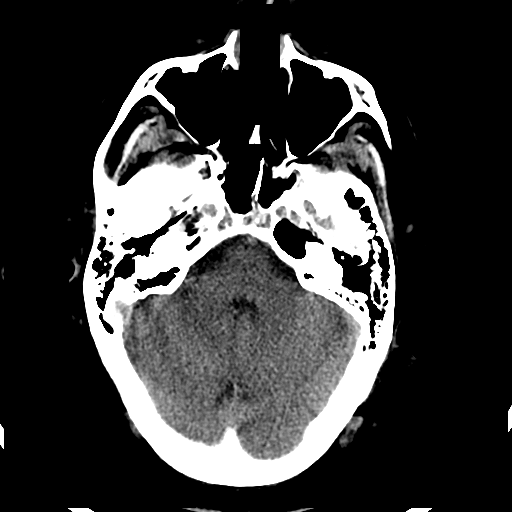
[im 10/34  brain]
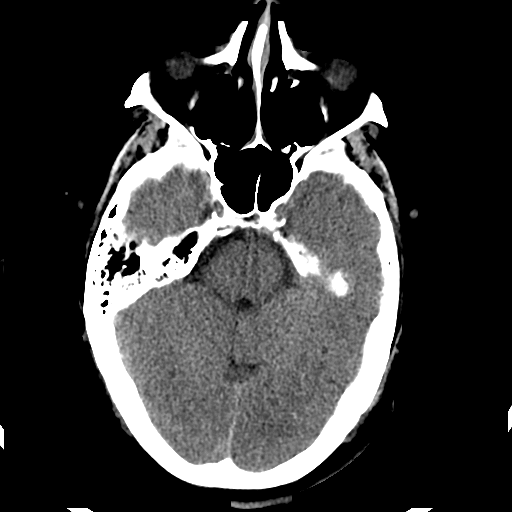
[im 12/34  brain]
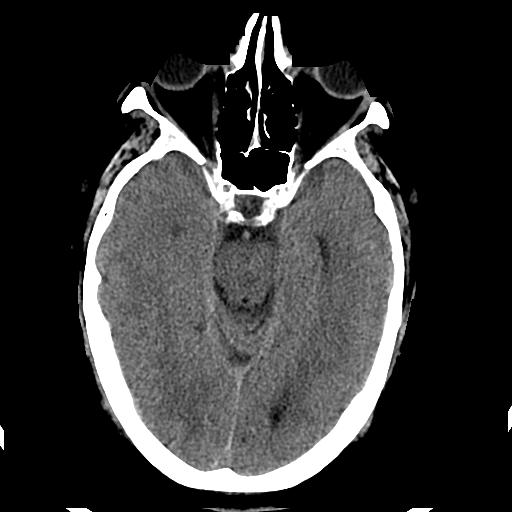
[im 12/34  bone]
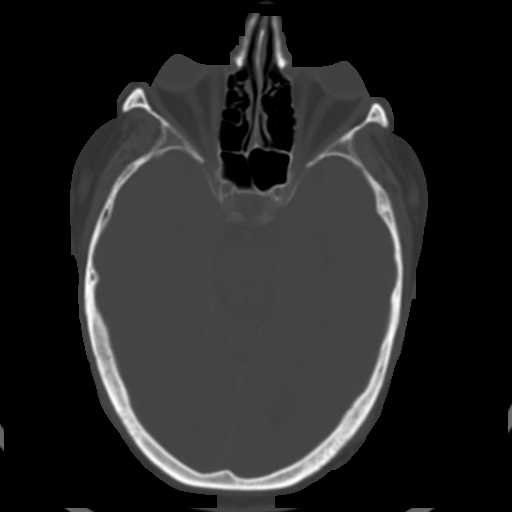
[im 15/34  brain]
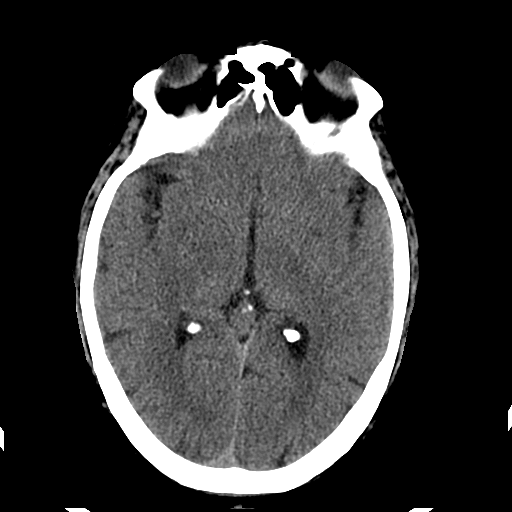
[im 17/34  brain]
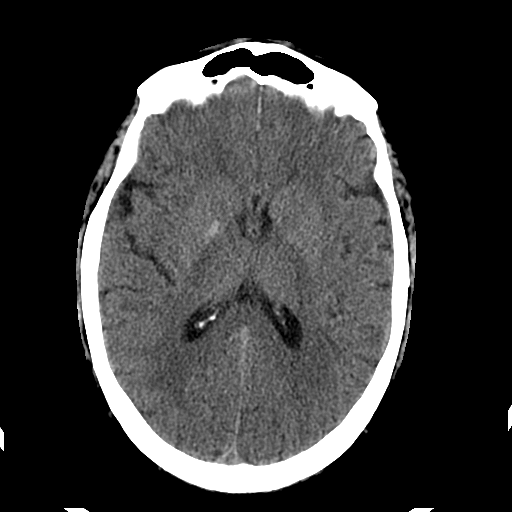
[im 19/34  brain]
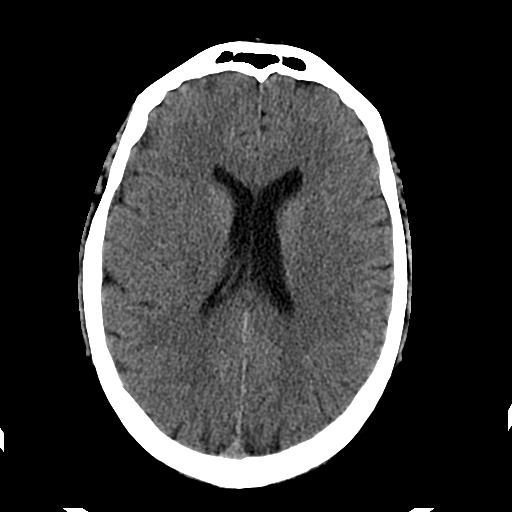
[im 22/34  brain]
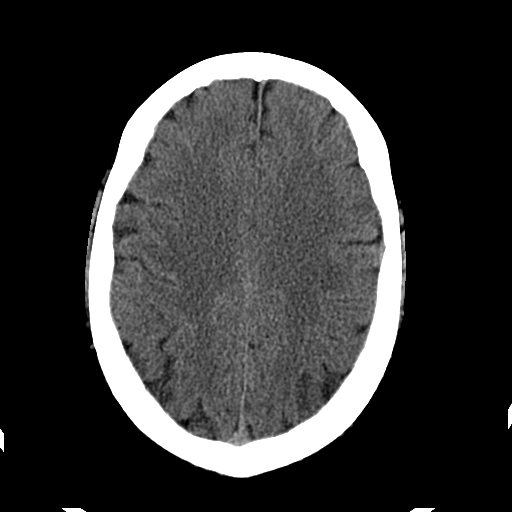
[im 22/34  bone]
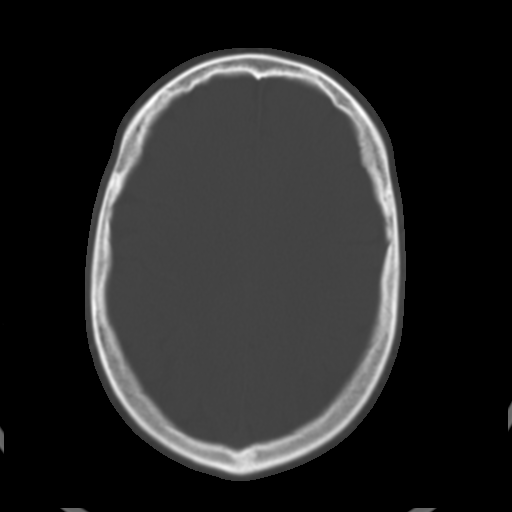
[im 24/34  brain]
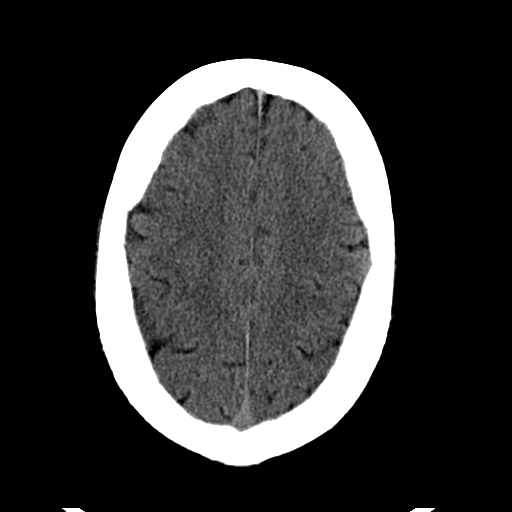
[im 26/34  brain]
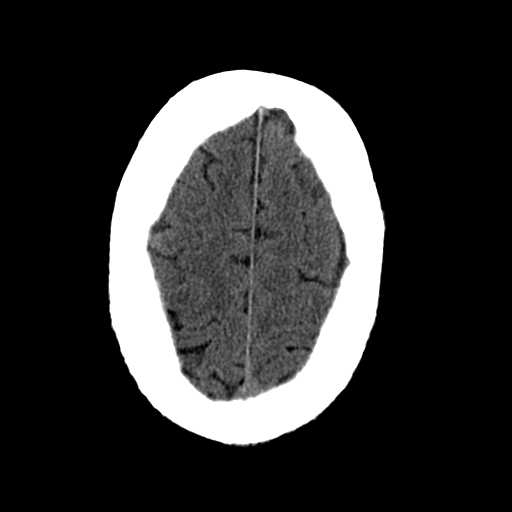
[im 29/34  brain]
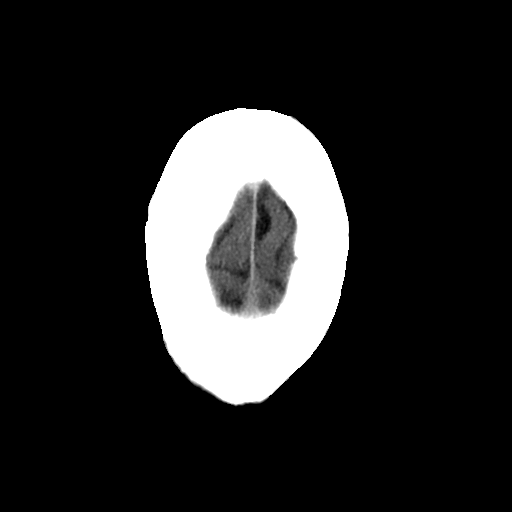
[im 31/34  brain]
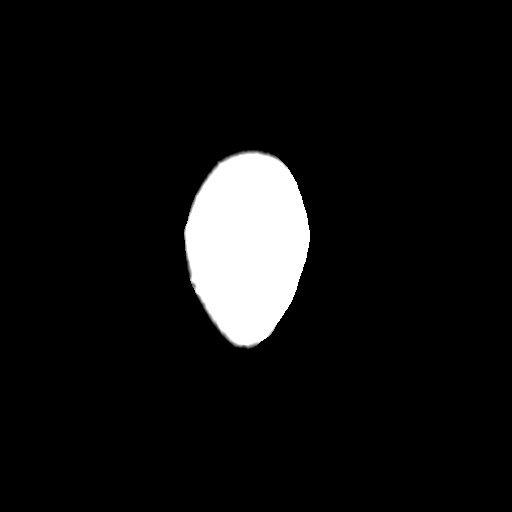
[im 31/34  bone]
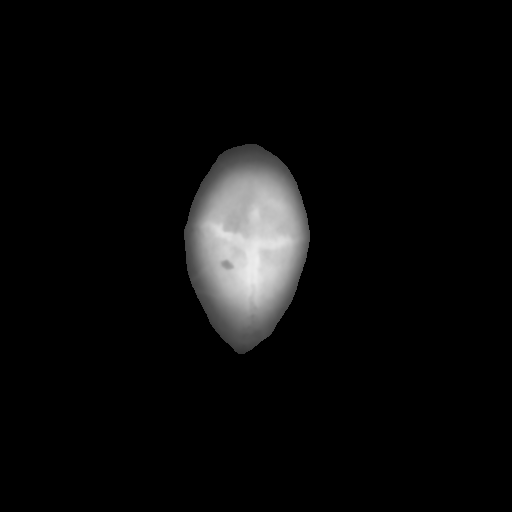

[Series 202: head w/o bone, idose (1) · axial · non-contrast · 0.43mm/px · z∈[+68,+113]mm · 3 of 34 slices shown]
[im 3/34  bone]
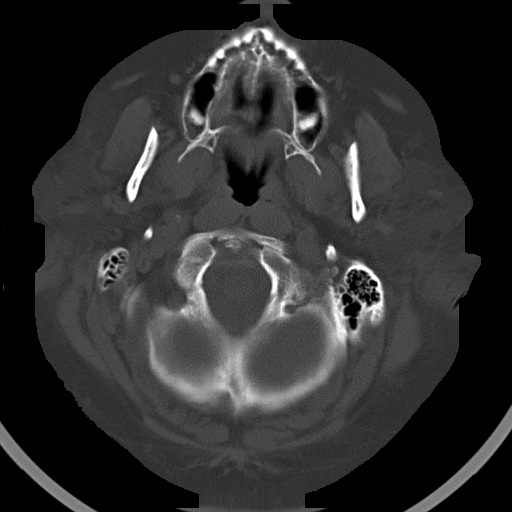
[im 8/34  bone]
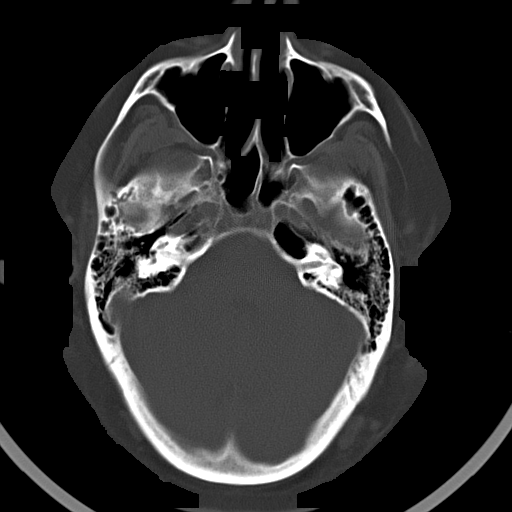
[im 12/34  bone]
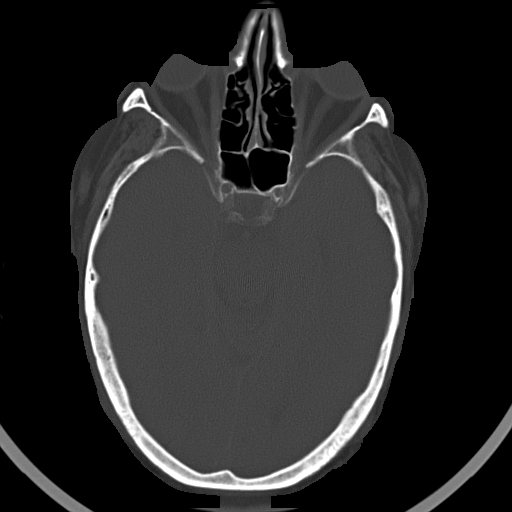

[16 of 30 positions shown; findings below may reference images not displayed]

FINDINGS: The bony calvarium is intact. The paranasal sinuses and mastoid air
cells are well aerated. No findings to suggest acute hemorrhage,
acute infarction or space-occupying mass lesion are noted. Basal
ganglia calcifications are seen.
IMPRESSION: No acute abnormality noted.

## 2016-04-04 ENCOUNTER — Ambulatory Visit
Admission: RE | Admit: 2016-04-04 | Discharge: 2016-04-04 | Disposition: A | Discharge status: Home / Self Care | Payer: Commercial Managed Care - HMO | Source: Ambulatory Visit | Attending: Internal Medicine | Admitting: Internal Medicine

## 2016-04-04 DIAGNOSIS — R1012 Left upper quadrant pain: Secondary | ICD-10-CM | POA: Diagnosis not present

## 2016-04-04 DIAGNOSIS — R109 Unspecified abdominal pain: Secondary | ICD-10-CM | POA: Diagnosis not present

## 2016-04-10 IMAGING — CR DG CHEST 2V
1 series · 2 of 2 positions shown · non-contrast
Comparison: Chest radiograph performed 07/28/2014

CLINICAL DATA: Acute onset of shortness of breath for 5 days.
Worsening nonproductive cough. Bilateral lower leg swelling for
several months. Initial encounter.

EXAM:
CHEST  2 VIEW

[Series 1: dxr chest pa (or ap) and lateral · 0.14mm/px · 2 of 2 slices shown]
[im 1/2]
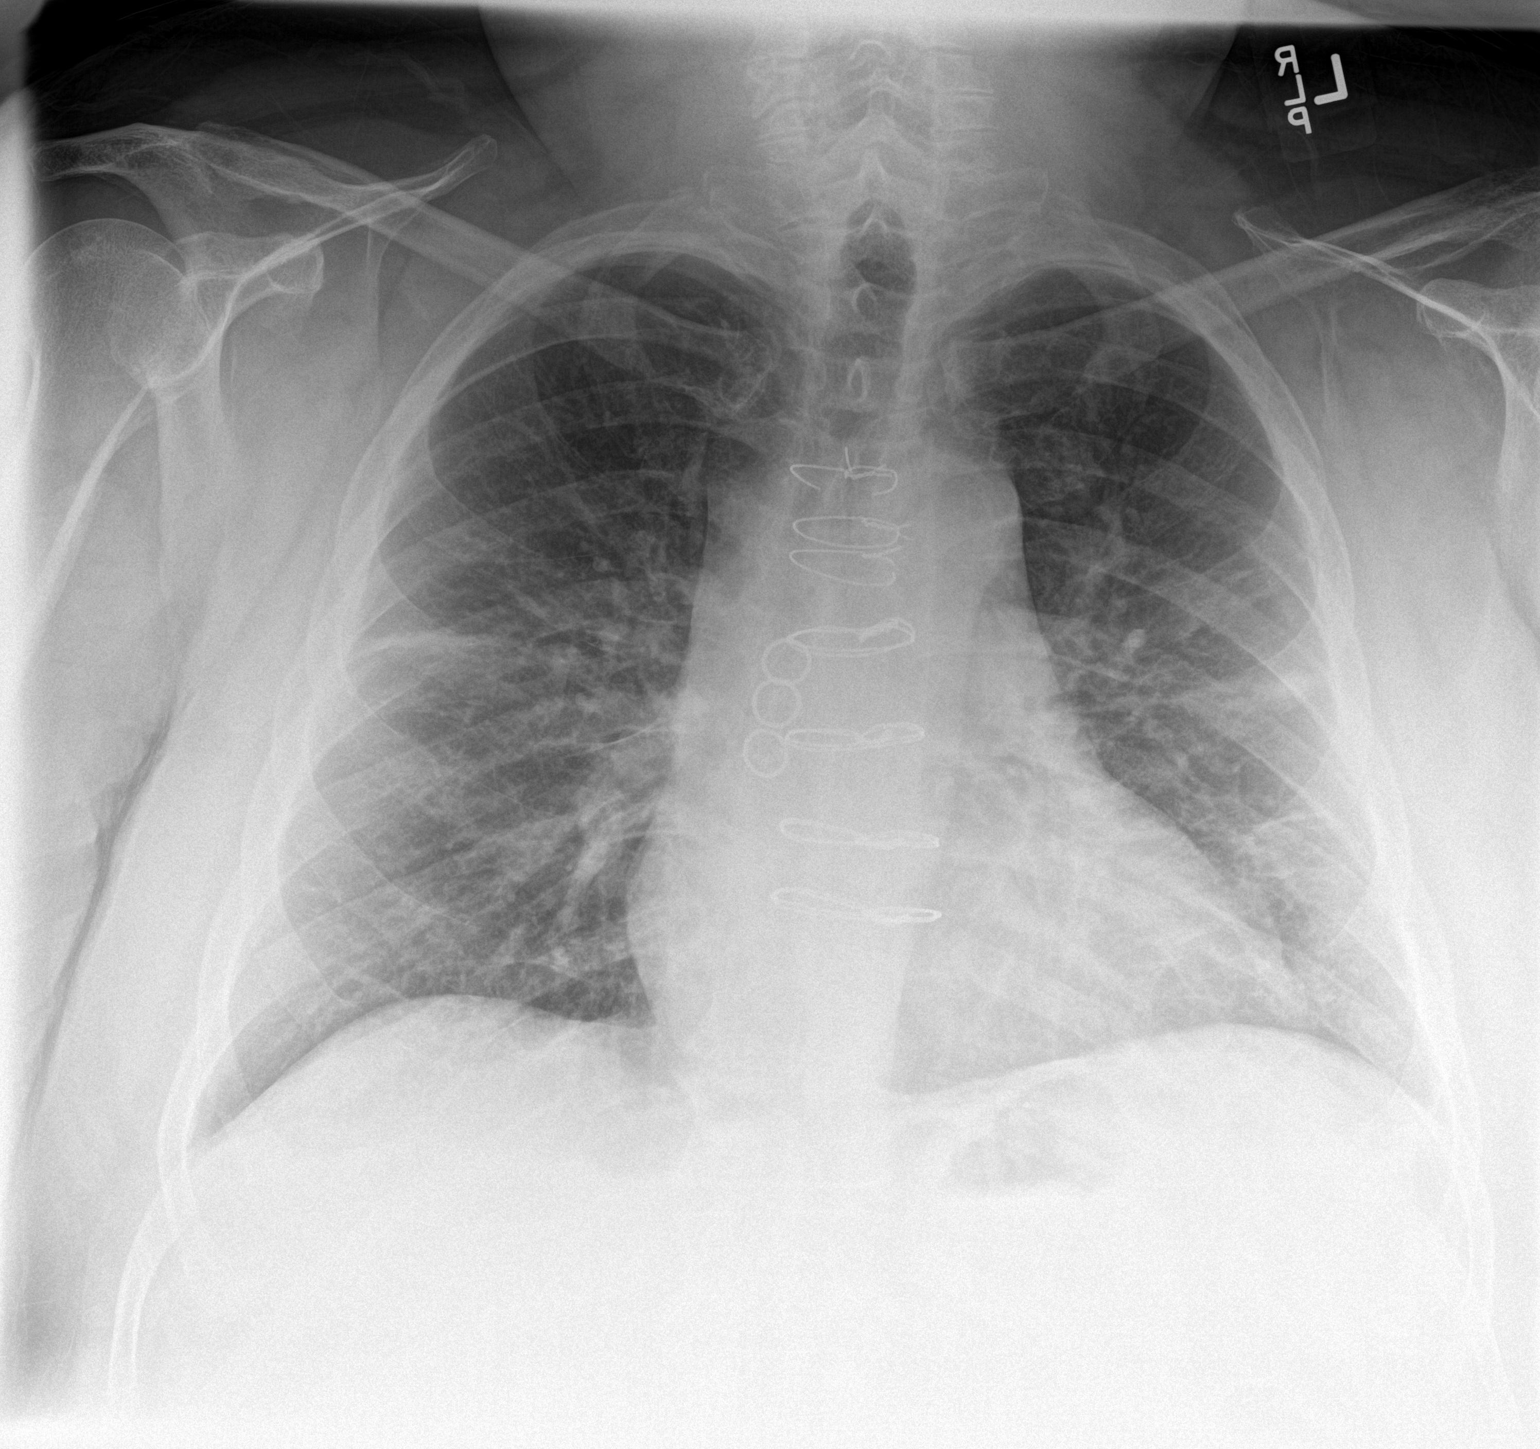
[im 2/2]
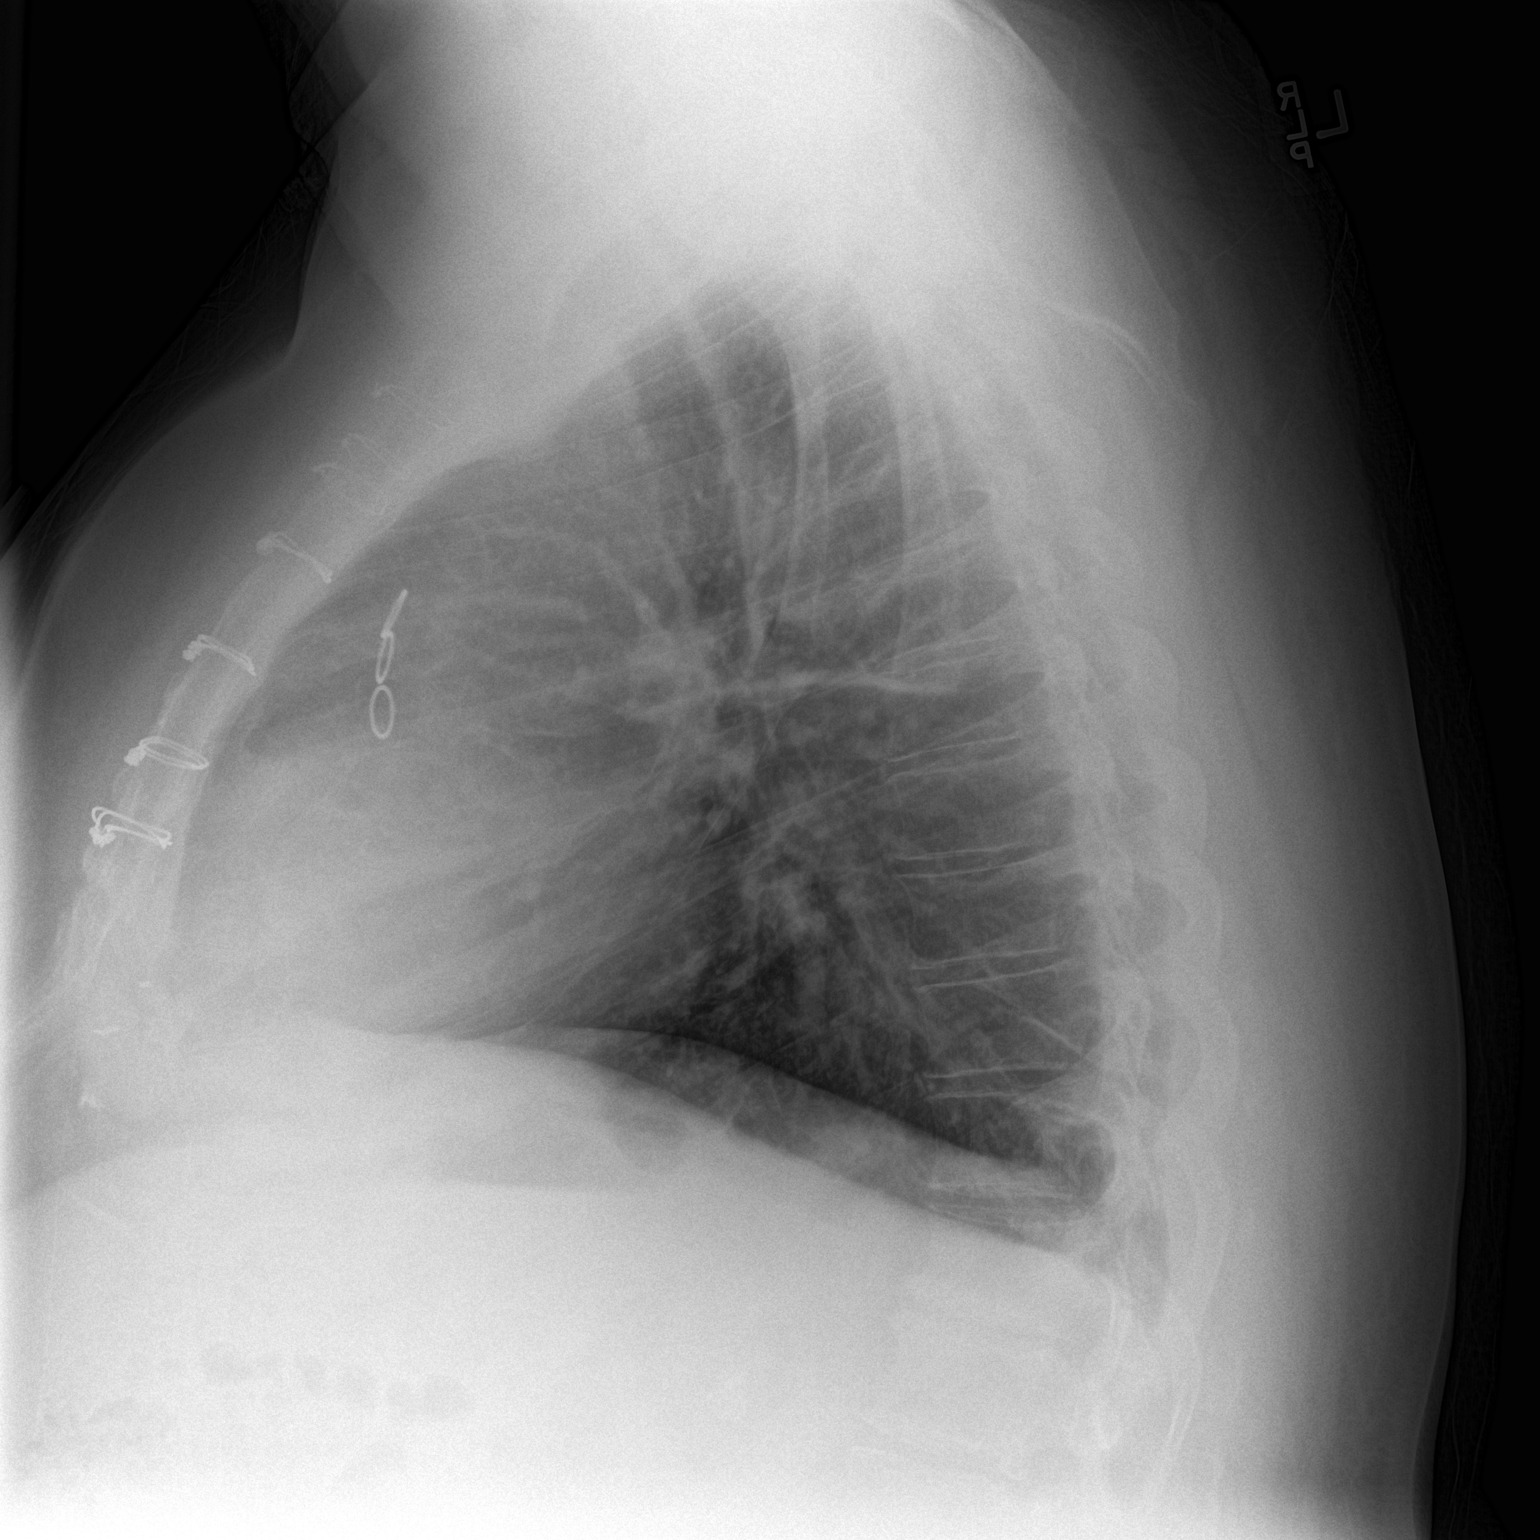

[2 of 2 positions shown; findings below may reference images not displayed]

FINDINGS: The lungs are well-aerated. Vascular congestion is again noted.
Patchy bilateral airspace opacities may reflect mild interstitial
edema or possibly pneumonia. There is no evidence of pleural
effusion or pneumothorax.

The heart is borderline normal in size. The patient is status post
median sternotomy, with evidence of prior CABG. Several fractured
sternal wires are again seen. No acute osseous abnormalities are
seen.
IMPRESSION: Vascular congestion noted. Patchy bilateral airspace opacities may
reflect mild interstitial edema or possibly pneumonia.

## 2016-04-10 IMAGING — CT CT HEAD WITHOUT CONTRAST
1 of 2 series · 16 of 30 positions shown, 20 images · non-contrast
Comparison: 07/28/2014

CLINICAL DATA: Intermittent confusion, slurred speech

EXAM:
CT HEAD WITHOUT CONTRAST
TECHNIQUE: Contiguous axial images were obtained from the base of the skull
through the vertex without intravenous contrast.

[Series 2: head wo · axial · 0.51mm/px · z∈[-175,-31]mm · 16 of 36 slices shown, 20 images]
[im 2/36  brain]
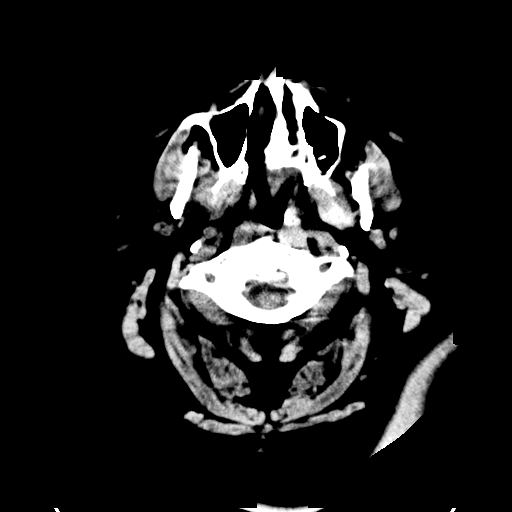
[im 2/36  bone]
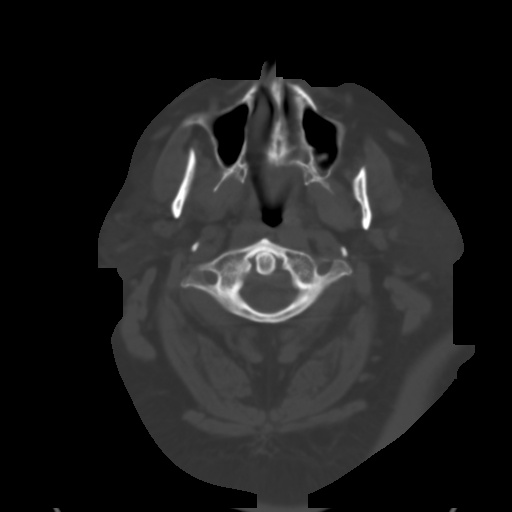
[im 4/36  brain]
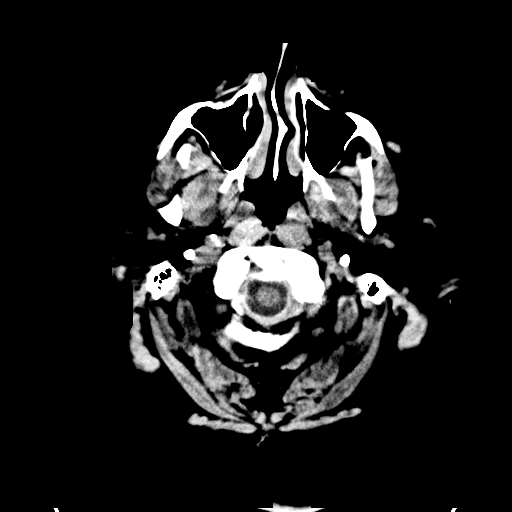
[im 6/36  brain]
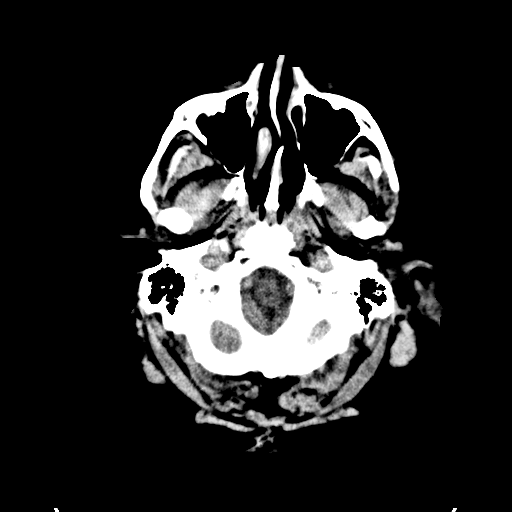
[im 9/36  brain]
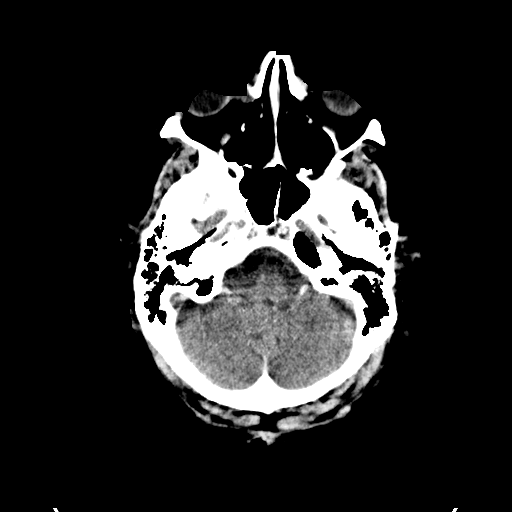
[im 11/36  brain]
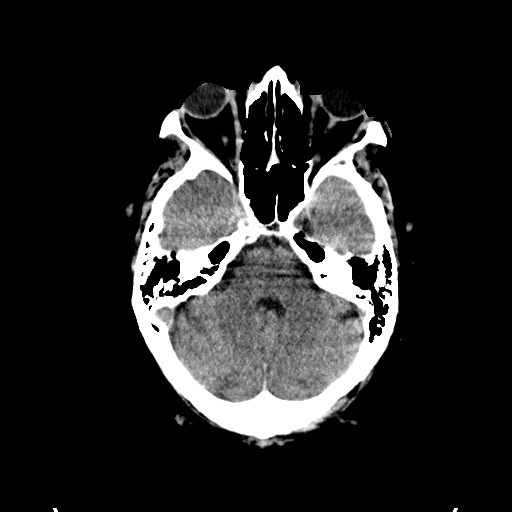
[im 11/36  bone]
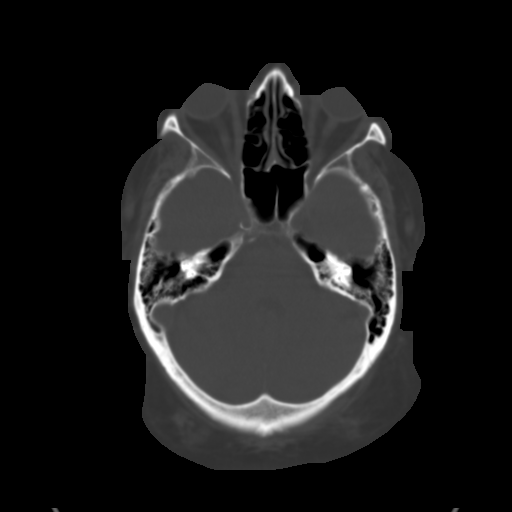
[im 13/36  brain]
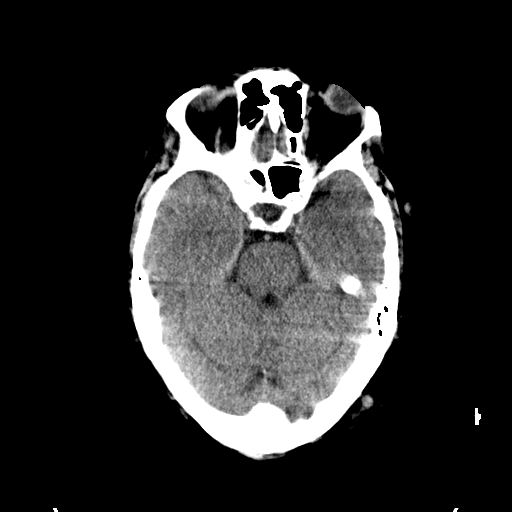
[im 15/36  brain]
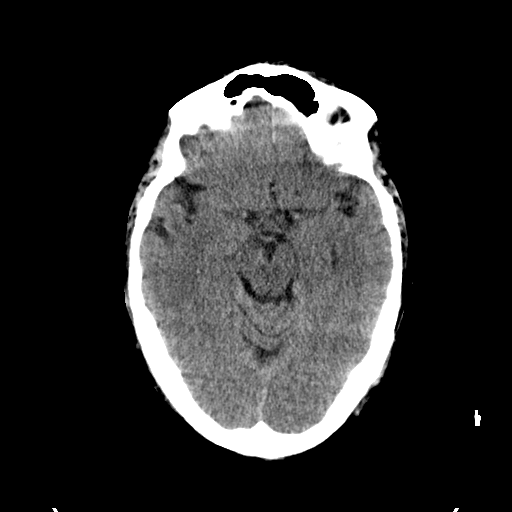
[im 16/36  brain]
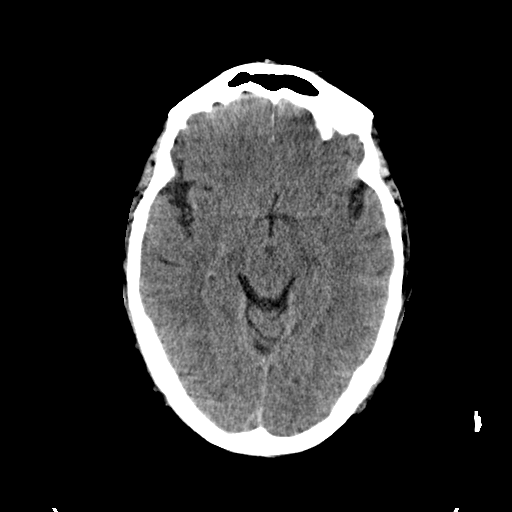
[im 20/36  brain]
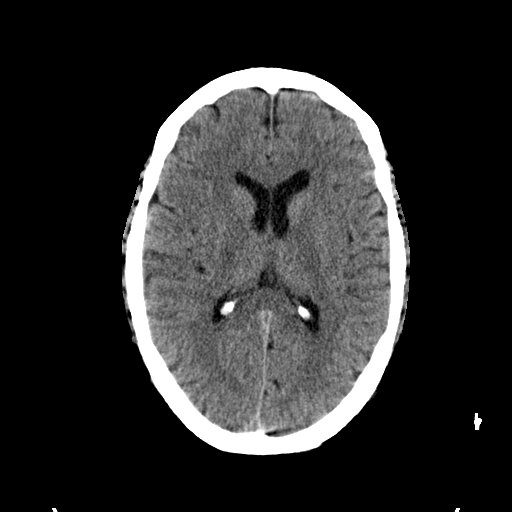
[im 20/36  bone]
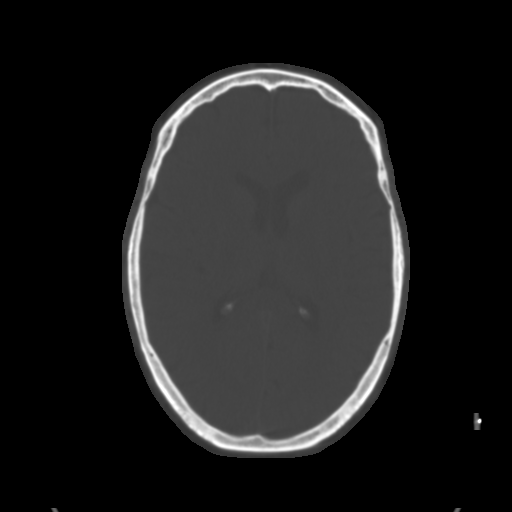
[im 22/36  brain]
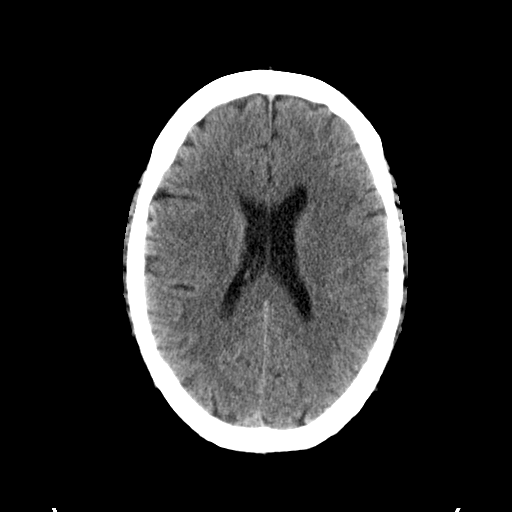
[im 23/36  brain]
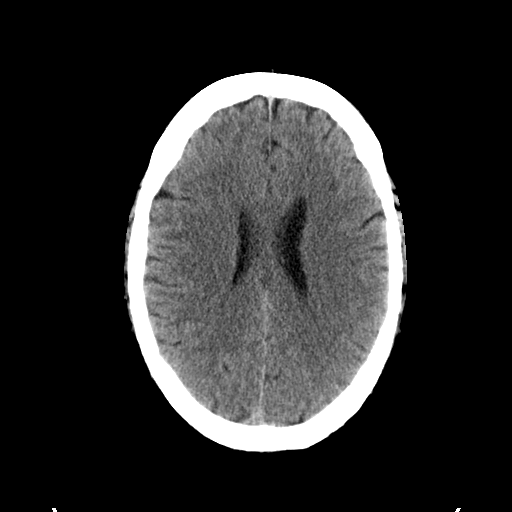
[im 25/36  brain]
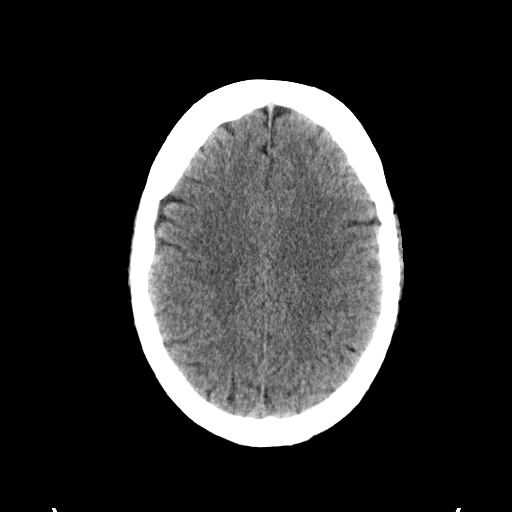
[im 27/36  brain]
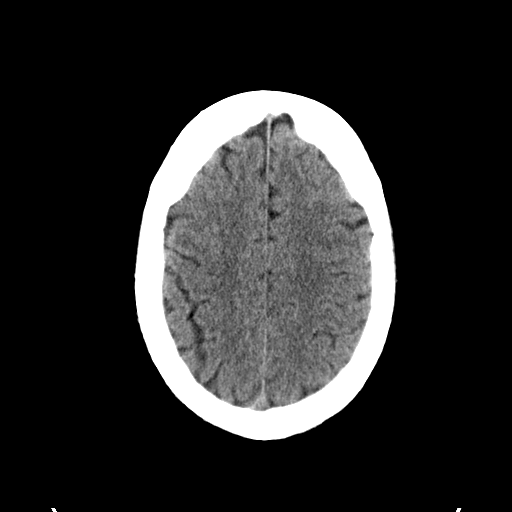
[im 27/36  bone]
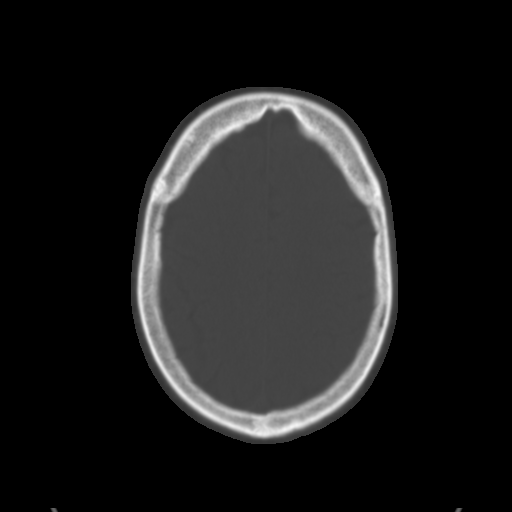
[im 30/36  brain]
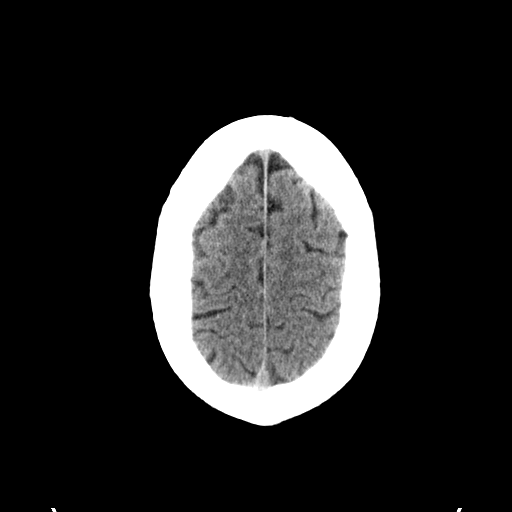
[im 32/36  brain]
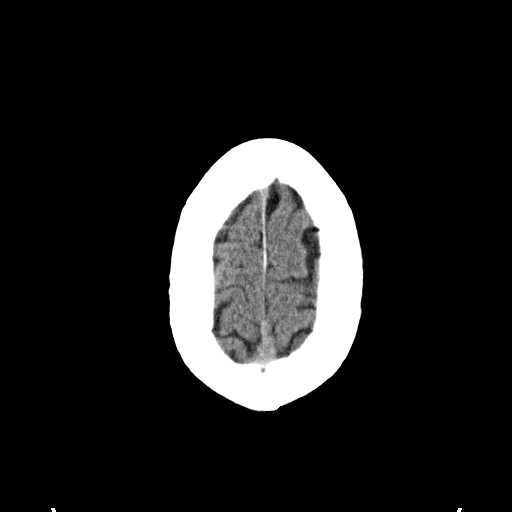
[im 34/36  brain]
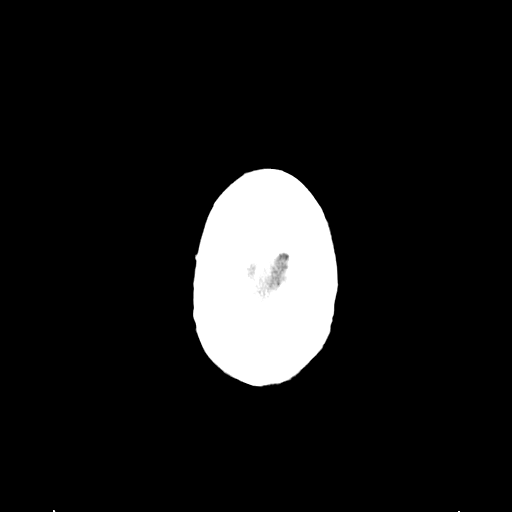

[16 of 30 positions shown; findings below may reference images not displayed]

FINDINGS: Motion artifact noted at the skull base. Images were repeated but
motion persisted. No acute hemorrhage, infarct, or mass lesion is
identified. No midline shift. Ventricles are normal in size. Orbits
and paranasal sinuses are unremarkable. No skull fracture.
IMPRESSION: No acute intracranial finding.

## 2016-05-16 ENCOUNTER — Other Ambulatory Visit (HOSPITAL_COMMUNITY): Payer: Self-pay | Admitting: Student

## 2016-05-19 ENCOUNTER — Ambulatory Visit: Payer: Commercial Managed Care - HMO | Admitting: Cardiovascular Disease

## 2016-06-10 IMAGING — CR DG CHEST 2V
1 series · 2 of 2 positions shown · non-contrast
Comparison: Chest radiograph August 14, 2014

CLINICAL DATA: Difficulty breathing beginning yesterday. Seen
yesterday for irregular heart rate.

EXAM:
CHEST  2 VIEW

[Series 1: dxr chest pa (or ap) and lateral · 0.14mm/px · 2 of 2 slices shown]
[im 1/2]
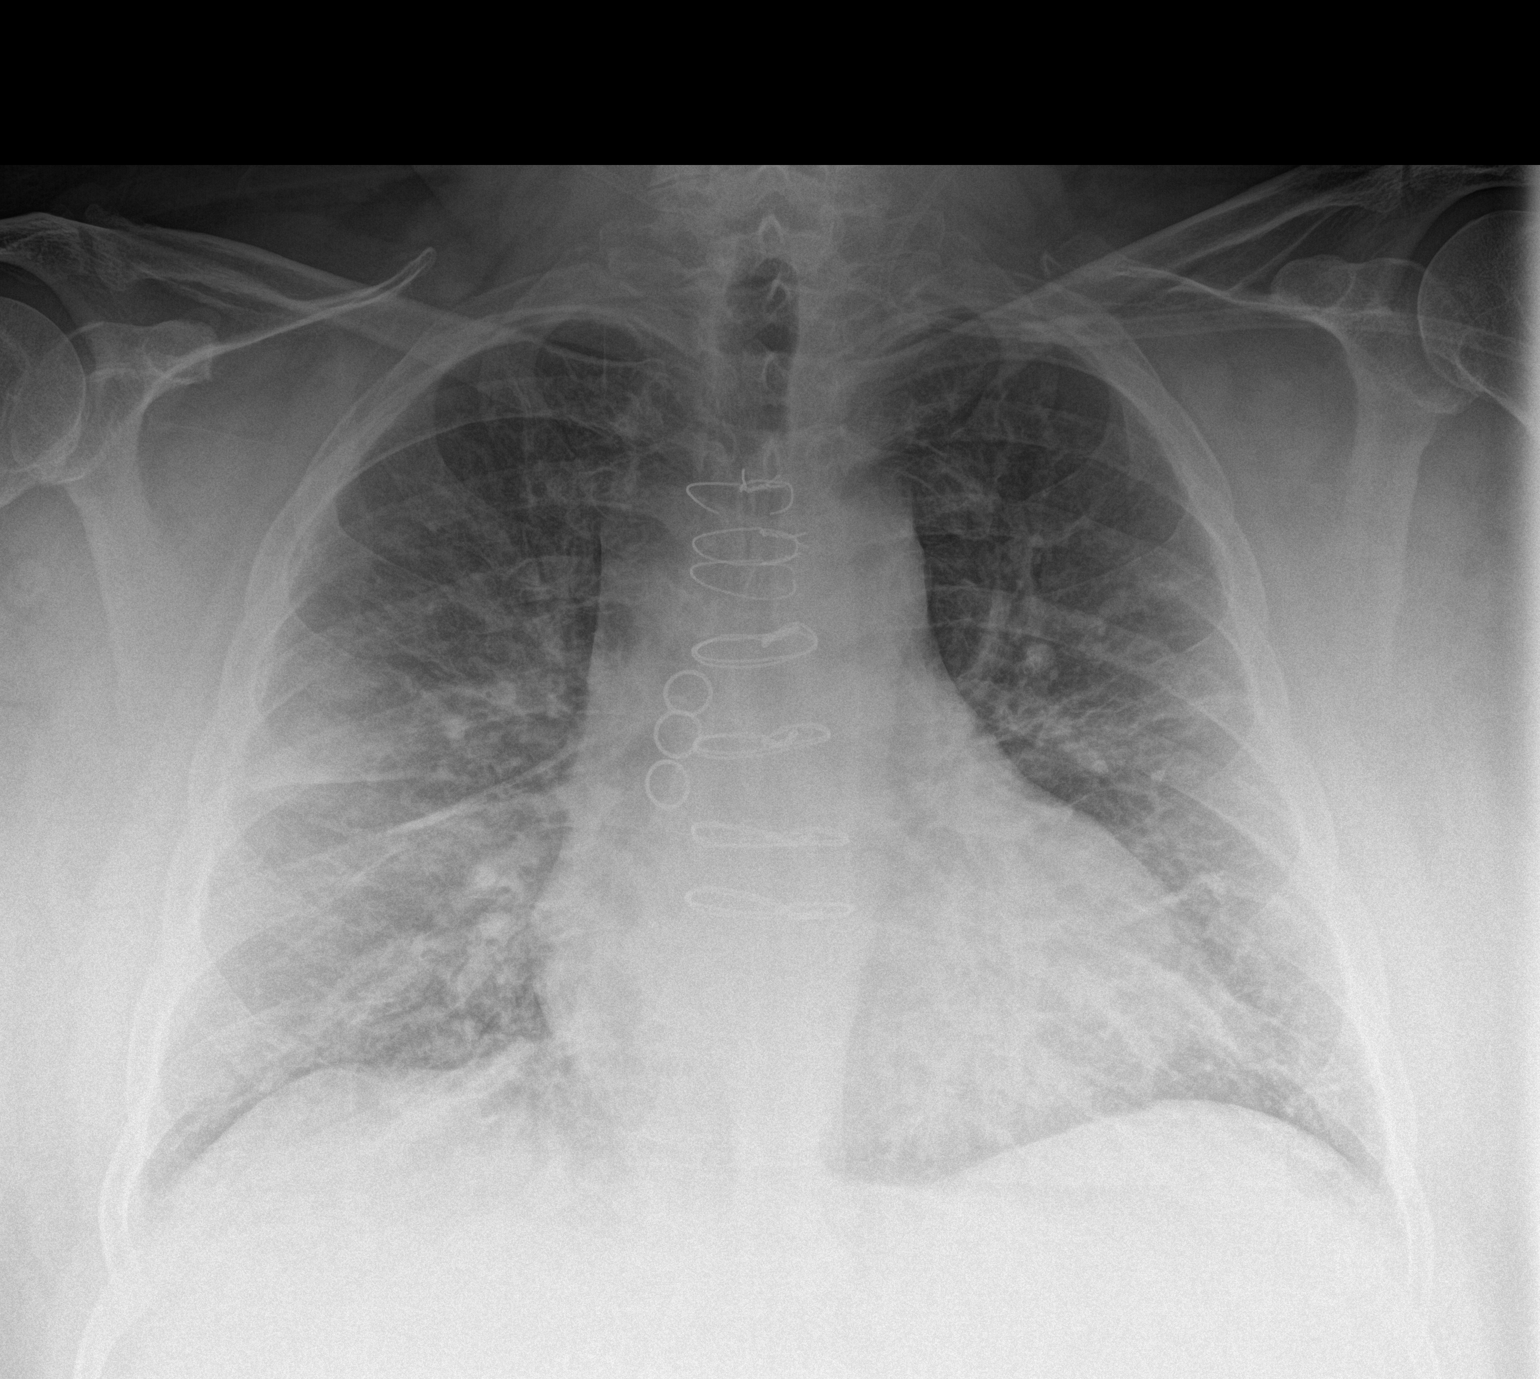
[im 2/2]
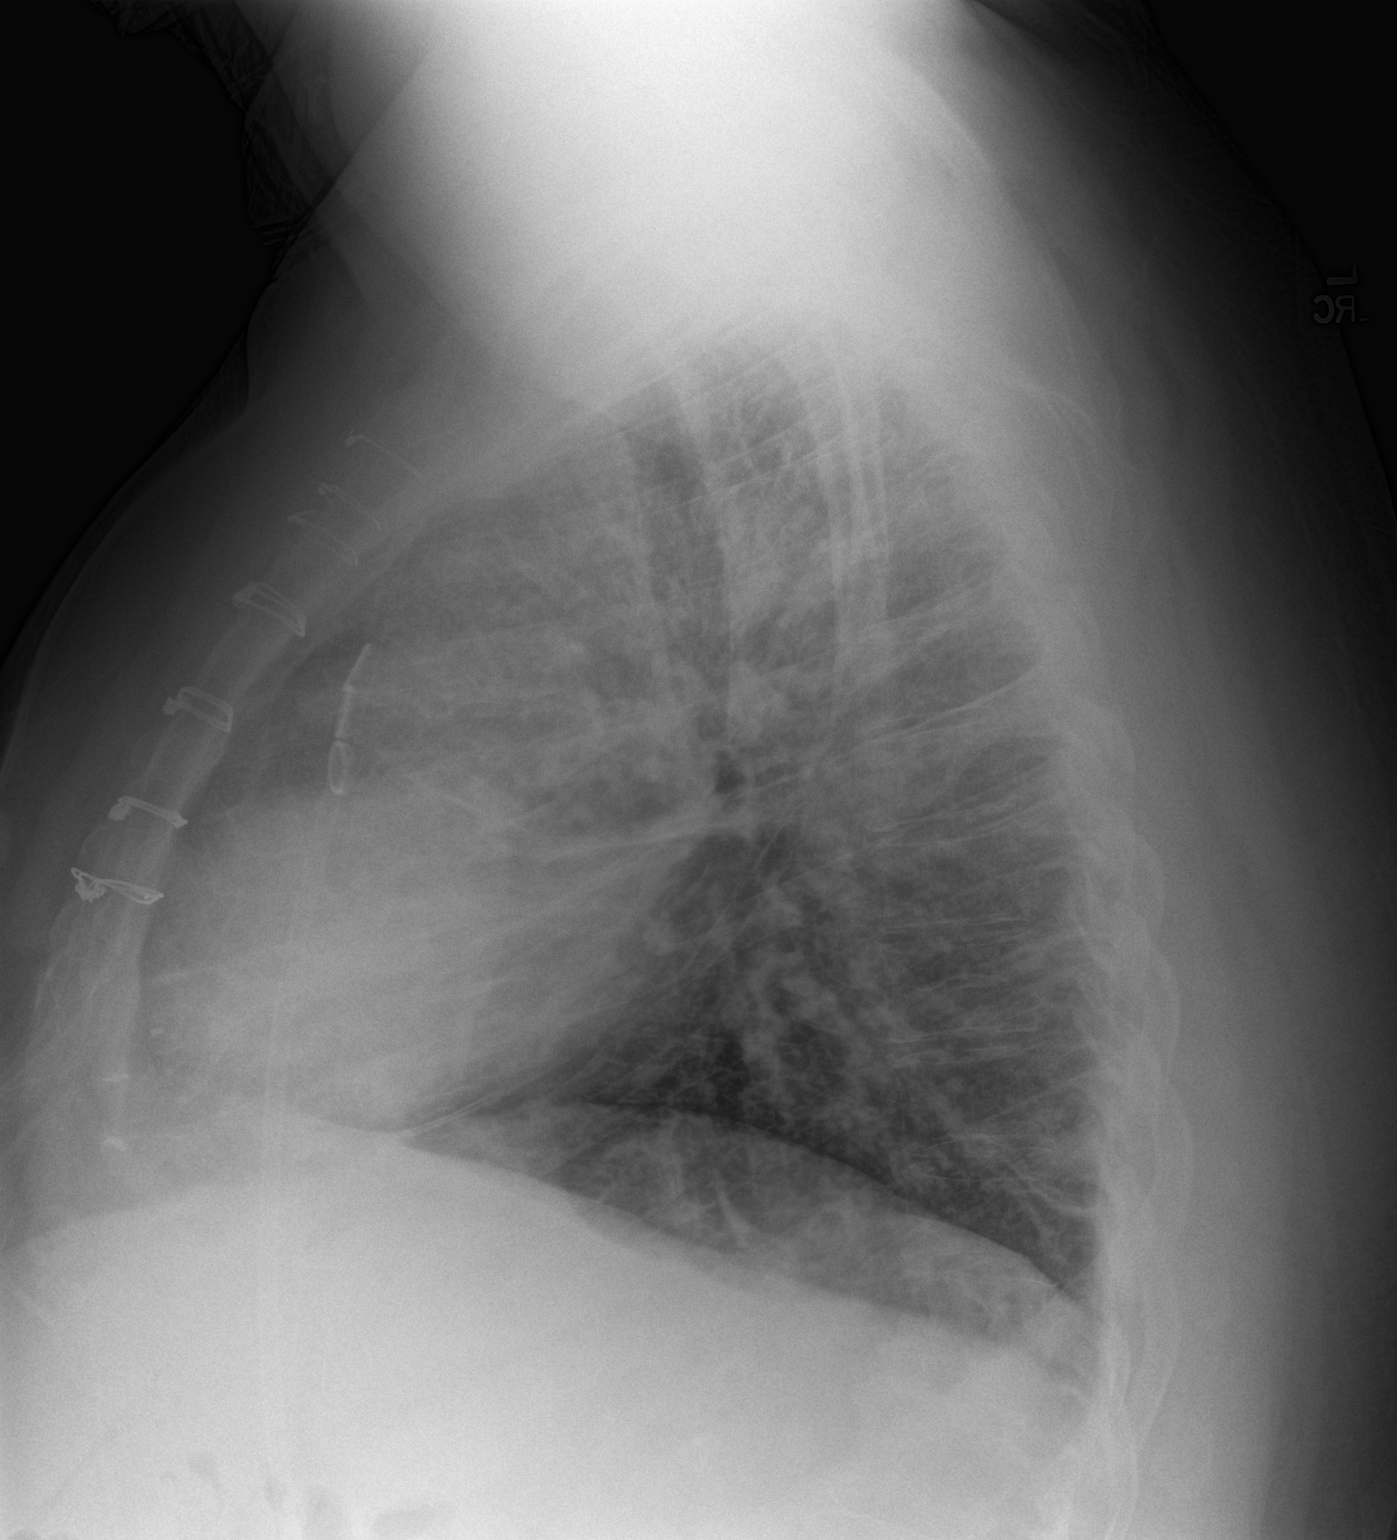

[2 of 2 positions shown; findings below may reference images not displayed]

FINDINGS: The cardiac silhouette is mildly enlarged. Status post median
sternotomy with fractured most superior sternotomy wires. Similar
pulmonary vascular congestion mild interstitial prominence, strandy
densities in lung bases (atelectasis/scarring). Faint hazy density
projects in RIGHT midlung zone increased lung volumes with flattened
hemidiaphragm can be seen with COPD. No pneumothorax. Large body
habitus. Osseous structure nonsuspicious.
IMPRESSION: Mild cardiomegaly. Pulmonary vascular congestion with increasing
interstitial prominence which could reflect atypical infection or
possibly pulmonary edema. Hazy density RIGHT midlung zone suggests
pleural thickening, less like focal consolidation. Probable COPD.

By: Satriono Zhelebour

## 2016-06-12 DIAGNOSIS — M5136 Other intervertebral disc degeneration, lumbar region: Secondary | ICD-10-CM | POA: Diagnosis not present

## 2016-06-12 DIAGNOSIS — F411 Generalized anxiety disorder: Secondary | ICD-10-CM | POA: Diagnosis not present

## 2016-06-12 DIAGNOSIS — Z951 Presence of aortocoronary bypass graft: Secondary | ICD-10-CM | POA: Diagnosis not present

## 2016-06-12 DIAGNOSIS — E1143 Type 2 diabetes mellitus with diabetic autonomic (poly)neuropathy: Secondary | ICD-10-CM | POA: Diagnosis not present

## 2016-06-12 DIAGNOSIS — E7800 Pure hypercholesterolemia, unspecified: Secondary | ICD-10-CM | POA: Diagnosis not present

## 2016-06-12 DIAGNOSIS — I10 Essential (primary) hypertension: Secondary | ICD-10-CM | POA: Diagnosis not present

## 2016-06-12 DIAGNOSIS — I350 Nonrheumatic aortic (valve) stenosis: Secondary | ICD-10-CM | POA: Diagnosis not present

## 2016-06-12 DIAGNOSIS — Z79899 Other long term (current) drug therapy: Secondary | ICD-10-CM | POA: Diagnosis not present

## 2016-07-16 ENCOUNTER — Other Ambulatory Visit (HOSPITAL_COMMUNITY): Payer: Self-pay | Admitting: Student

## 2016-07-19 ENCOUNTER — Emergency Department: Payer: Medicare HMO

## 2016-07-19 ENCOUNTER — Emergency Department
Admission: EM | Admit: 2016-07-19 | Discharge: 2016-07-19 | Disposition: A | Discharge status: Home / Self Care | Payer: Medicare HMO | Attending: Emergency Medicine | Admitting: Emergency Medicine

## 2016-07-19 ENCOUNTER — Encounter: Payer: Self-pay | Admitting: Emergency Medicine

## 2016-07-19 DIAGNOSIS — E119 Type 2 diabetes mellitus without complications: Secondary | ICD-10-CM | POA: Insufficient documentation

## 2016-07-19 DIAGNOSIS — Z794 Long term (current) use of insulin: Secondary | ICD-10-CM | POA: Insufficient documentation

## 2016-07-19 DIAGNOSIS — I5022 Chronic systolic (congestive) heart failure: Secondary | ICD-10-CM | POA: Diagnosis not present

## 2016-07-19 DIAGNOSIS — I110 Hypertensive heart disease with heart failure: Secondary | ICD-10-CM | POA: Insufficient documentation

## 2016-07-19 DIAGNOSIS — I2510 Atherosclerotic heart disease of native coronary artery without angina pectoris: Secondary | ICD-10-CM | POA: Insufficient documentation

## 2016-07-19 DIAGNOSIS — R079 Chest pain, unspecified: Secondary | ICD-10-CM | POA: Diagnosis not present

## 2016-07-19 DIAGNOSIS — Z79899 Other long term (current) drug therapy: Secondary | ICD-10-CM | POA: Insufficient documentation

## 2016-07-19 DIAGNOSIS — R0989 Other specified symptoms and signs involving the circulatory and respiratory systems: Secondary | ICD-10-CM | POA: Diagnosis not present

## 2016-07-19 DIAGNOSIS — R0602 Shortness of breath: Secondary | ICD-10-CM | POA: Insufficient documentation

## 2016-07-19 DIAGNOSIS — R0600 Dyspnea, unspecified: Secondary | ICD-10-CM

## 2016-07-19 DIAGNOSIS — J449 Chronic obstructive pulmonary disease, unspecified: Secondary | ICD-10-CM | POA: Diagnosis not present

## 2016-07-19 DIAGNOSIS — Z7982 Long term (current) use of aspirin: Secondary | ICD-10-CM | POA: Insufficient documentation

## 2016-07-19 LAB — CBC
HEMATOCRIT: 43.2 % (ref 40.0–52.0)
HEMOGLOBIN: 14.7 g/dL (ref 13.0–18.0)
MCH: 30.5 pg (ref 26.0–34.0)
MCHC: 34 g/dL (ref 32.0–36.0)
MCV: 89.5 fL (ref 80.0–100.0)
PLATELETS: 133 10*3/uL — AB (ref 150–440)
RBC: 4.82 MIL/uL (ref 4.40–5.90)
RDW: 13.5 % (ref 11.5–14.5)
WBC: 6.4 10*3/uL (ref 3.8–10.6)

## 2016-07-19 LAB — BASIC METABOLIC PANEL
Anion gap: 10 (ref 5–15)
BUN: 23 mg/dL — ABNORMAL HIGH (ref 6–20)
CHLORIDE: 100 mmol/L — AB (ref 101–111)
CO2: 28 mmol/L (ref 22–32)
CREATININE: 1.02 mg/dL (ref 0.61–1.24)
Calcium: 8.8 mg/dL — ABNORMAL LOW (ref 8.9–10.3)
GFR calc Af Amer: >60 mL/min (ref >60)
GFR calc non Af Amer: >60 mL/min (ref >60)
Glucose, Bld: 359 mg/dL — ABNORMAL HIGH (ref 65–99)
Potassium: 3.8 mmol/L (ref 3.5–5.1)
Sodium: 138 mmol/L (ref 135–145)

## 2016-07-19 LAB — GLUCOSE, CAPILLARY: Glucose-Capillary: 349 mg/dL — ABNORMAL HIGH (ref 65–99)

## 2016-07-19 LAB — BRAIN NATRIURETIC PEPTIDE: B Natriuretic Peptide: 180 pg/mL — ABNORMAL HIGH (ref 0.0–100.0)

## 2016-07-19 LAB — TROPONIN I: Troponin I: <0.03 ng/mL (ref <0.03)

## 2016-07-19 MED ORDER — ACETAMINOPHEN 325 MG PO TABS
ORAL_TABLET | ORAL | Status: AC
  Administered 2016-07-19: 650 mg via ORAL
  Filled 2016-07-19: qty 2

## 2016-07-19 MED ORDER — INSULIN ASPART 100 UNIT/ML ~~LOC~~ SOLN
3.0000 [IU] | Freq: Once | SUBCUTANEOUS | Status: AC
Start: 2016-07-19 — End: 2016-07-19
  Administered 2016-07-19: 3 [IU] via SUBCUTANEOUS
  Filled 2016-07-19: qty 3

## 2016-07-19 MED ORDER — ACETAMINOPHEN 325 MG PO TABS
650.0000 mg | ORAL_TABLET | Freq: Once | ORAL | Status: AC
  Administered 2016-07-19: 650 mg via ORAL

## 2016-07-19 MED ORDER — IOPAMIDOL (ISOVUE-370) INJECTION 76%
75.0000 mL | Freq: Once | INTRAVENOUS | Status: AC | PRN
  Administered 2016-07-19: 75 mL via INTRAVENOUS

## 2016-07-19 NOTE — ED Triage Notes (Signed)
Patient states he feels "like someone is choking me". Patient states he has shortness of breath and what feels like indigestion. Patient has h/o open heart surgery and extensive medical history.

## 2016-07-19 NOTE — ED Provider Notes (Addendum)
[facility] Emergency Department Provider Note   ____________________________________________   First MD Initiated Contact with Patient 07/19/16 1726     (approximate)  I have reviewed the triage vital signs and the nursing notes.   HISTORY  Chief Complaint Shortness of Breath    HPI Marcus Williams is a 61 y.o. male patient says he's been a little short of breath for the last week or so and had a little sensation somebody choking him and his neck but today it got much worse. He also has a little feeling of indigestion and some pain in the left side of his chest that hurts when he pushes on it. He is not coughing does not have a fever does not think he is having a heart attack  . The pain is very mild in his chest Past Medical History:  Diagnosis Date  . A-fib ([place])   . Aortic stenosis, moderate 11/2015  . Arthritis   . CAD (coronary artery disease)   . Chronic systolic CHF (congestive heart failure) ([place])   . Diabetes mellitus without complication ([place])    INSULIN DEPENDENT  . GERD (gastroesophageal reflux disease)   . History of cardioversion   . Hypertension   . MI (myocardial infarction)   . OSA on CPAP   . Sepsis ([place])   . Shortness of breath dyspnea   . SVT (supraventricular tachycardia) [facility])     Patient Active Problem List   Diagnosis Date Noted  . Aortic stenosis 11/21/2015  . Acute on chronic diastolic CHF (congestive heart failure), NYHA class 3 ([place]) 11/21/2015  . Aortic valve, bicuspid 09/24/2015  . Cellulitis 05/04/2015  . Acute respiratory failure with hypoxia ([place]) 03/26/2015  . CHF (congestive heart failure) ([place]) 01/15/2015  . HTN (hypertension) 01/14/2015  . Type II diabetes mellitus ([place]) 01/14/2015  . COPD (chronic obstructive pulmonary disease) ([place]) 01/14/2015  . OSA on CPAP 01/14/2015  . CAD (coronary artery disease) 01/14/2015  . A-fib ([place]) 01/14/2015  . Pain of left calf 01/14/2015  . Acute on chronic systolic  CHF (congestive heart failure) ([place]) 01/14/2015  . COPD exacerbation ([place]) 12/22/2014  . Diabetes ([place]) 10/14/2014    Past Surgical History:  Procedure Laterality Date  . 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 BUNIONECTOMY    . CARDIAC CATHETERIZATION N/A 10/02/2015   Procedure: Coronary/Grafts Angiography;  Surgeon: Teodoro Spray, MD;  Location: [place] CV LAB;  Service: Cardiovascular;  Laterality: N/A;  . CARDIAC CATHETERIZATION N/A 11/21/2015   Procedure: Right and Left Heart Cath;  Surgeon: Sherren Mocha, MD;  Location: [place] CV LAB;  Service: Cardiovascular;  Laterality: N/A;  . CORONARY ARTERY BYPASS GRAFT    . KNEE SURGERY    . quadruple bypass    . TEE WITHOUT CARDIOVERSION  11/21/2015  . TEE WITHOUT CARDIOVERSION N/A 11/21/2015   Procedure: TRANSESOPHAGEAL ECHOCARDIOGRAM (TEE);  Surgeon: Larey Dresser, MD;  Location: [place];  Service: Cardiovascular;  Laterality: N/A;    Prior to Admission medications   Medication Sig Start Date End Date Taking? Authorizing Provider  albuterol (PROVENTIL HFA;VENTOLIN HFA) 108 (90 Base) MCG/ACT inhaler Inhale 2 puffs into the lungs every 6 (six) hours as needed for wheezing or shortness of breath. 07/11/15  Yes Flora Lipps, MD  aspirin 81 MG EC tablet Take 1 tablet (81 mg total) by mouth daily. 03/10/16  Yes  Sherren Mocha, MD  diltiazem (CARDIZEM CD) 300 MG 24 hr capsule Take 1 capsule (300 mg total) by mouth daily. 10/15/14  Yes Idelle Crouch, MD  docusate sodium (COLACE) 100 MG capsule Take 100 mg by mouth 2 (two) times daily.   Yes Historical Provider, MD  ferrous sulfate 325 (65 FE) MG tablet Take 1 tablet (325 mg total) by mouth daily with breakfast. 05/16/16  Yes Amy D Clegg, NP  fluticasone (FLONASE) 50 MCG/ACT nasal spray Place 1 spray into both nostrils daily as needed for allergies.    Yes Historical Provider,  MD  insulin NPH Human (HUMULIN N,NOVOLIN N) 100 UNIT/ML injection Inject 60 Units into the skin 2 (two) times daily.    Yes Historical Provider, MD  metFORMIN (GLUCOPHAGE) 1000 MG tablet Take 1,000 mg by mouth 2 (two) times daily with a meal.   Yes Historical Provider, MD  metoprolol (LOPRESSOR) 100 MG tablet Take 100 mg by mouth 2 (two) times daily. 10/15/15  Yes Historical Provider, MD  nitroGLYCERIN (NITROSTAT) 0.4 MG SL tablet Place 0.4 mg under the tongue every 5 (five) minutes as needed for chest pain.   Yes Historical Provider, MD  pantoprazole (PROTONIX) 40 MG tablet Take 40 mg by mouth daily.   Yes Historical Provider, MD  potassium chloride 20 MEQ TBCR Take 20 mEq by mouth daily. 11/23/15  Yes Shirley Friar, PA-C  simvastatin (ZOCOR) 40 MG tablet Take 40 mg by mouth at bedtime.    Yes Historical Provider, MD  torsemide (DEMADEX) 20 MG tablet TAKE 3 TABLETS (60 MG TOTAL) BY MOUTH DAILY. 07/16/16  Yes Shirley Friar, PA-C  traZODone (DESYREL) 100 MG tablet Take 100 mg by mouth at bedtime.   Yes Historical Provider, MD  Fluticasone-Salmeterol (ADVAIR DISKUS) 250-50 MCG/DOSE AEPB Inhale 1 puff into the lungs 2 (two) times daily as needed (For shortness of breath.).     Historical Provider, MD  Rivaroxaban (XARELTO) 20 MG TABS tablet Take 1 tablet (20 mg total) by mouth daily with breakfast. Patient not taking: Reported on 07/19/2016 11/23/15   Shirley Friar, PA-C    Allergies Patient has no known allergies.  Family History  Problem Relation Age of Onset  . Stroke Mother   . Heart attack Mother   . Hypertension Mother   . Heart attack Father   . Hypertension Father   . Heart attack Brother     #1  . Diabetes Brother     #1  . Heart disease Brother     #2  . Lung disease Brother     #2  . Hypertension Brother     #2  . Diabetes Brother     #2    Social History Social History  Substance Use Topics  . Smoking status: Never Smoker  . Smokeless tobacco:  Never Used  . Alcohol use No    Review of Systems Constitutional: No fever/chills Eyes: No visual changes. ENT: See history of present illness Cardiovascular: See history of present illness Respiratory: See history of present illness. Gastrointestinal: No abdominal pain.  No nausea, no vomiting.  No diarrhea.  No constipation. Genitourinary: Negative for dysuria. Musculoskeletal: Negative for back pain. Skin: Negative for rash. Neurological: Negative for headaches, focal weakness or numbness.  10-point ROS otherwise negative.  ____________________________________________   PHYSICAL EXAM:  VITAL SIGNS: ED Triage Vitals  Enc Vitals Group     BP 07/19/16 1607 (!) 174/62     Pulse Rate 07/19/16 1607 (!) 58  Resp 07/19/16 1607 20     Temp 07/19/16 1607 98 F (36.7 C)     Temp Source 07/19/16 1607 Oral     SpO2 07/19/16 1607 96 %     Weight 07/19/16 1610 280 lb (127 kg)     Height 07/19/16 1610 5\' 8"  (1.727 m)     Head Circumference --      Peak Flow --      Pain Score 07/19/16 1611 8     Pain Loc --      Pain Edu? --      Excl. in [place]? --     Constitutional: Alert and oriented. Well appearing and in no acute distress. Eyes: Conjunctivae are normal. PERRL. EOMI. Head: Atraumatic. Nose: No congestion/rhinnorhea. Mouth/Throat: Mucous membranes are moist.  Oropharynx non-erythematous. Neck: No stridor. No cervical spine tenderness to palpation. Hematological/Lymphatic/Immunilogical: No cervical lymphadenopathy. Cardiovascular: Normal rate, regular rhythm. Grossly normal heart sounds.  Good peripheral circulation. Respiratory: Normal respiratory effort.  No retractions. Lungs CTAB. Chest is slightly tender to palpation in the left chest is palpation exactly reproduces the pain he is having Gastrointestinal: Soft and nontender. No distention. No abdominal bruits. No CVA tenderness. Musculoskeletal: No lower extremity tenderness nor edema.  No joint effusions. Neurologic:   Normal speech and language. No gross focal neurologic deficits are appreciated.    ____________________________________________   LABS (all labs ordered are listed, but only abnormal results are displayed)  Labs Reviewed  BASIC METABOLIC PANEL - Abnormal; Notable for the following:       Result Value   Chloride 100 (*)    Glucose, Bld 359 (*)    BUN 23 (*)    Calcium 8.8 (*)    All other components within normal limits  CBC - Abnormal; Notable for the following:    Platelets 133 (*)    All other components within normal limits  BRAIN NATRIURETIC PEPTIDE - Abnormal; Notable for the following:    B Natriuretic Peptide 180.0 (*)    All other components within normal limits  GLUCOSE, CAPILLARY - Abnormal; Notable for the following:    Glucose-Capillary 349 (*)    All other components within normal limits  TROPONIN I  CBG MONITORING, ED   ____________________________________________  EKG  EKG read and interpreted by me shows sinus bradycardia rate of 55 normal axis there are flipped T's in 1 and L which were present previously EKG today looks similar to one in June 7 last year ____________________________________________  RADIOLOGY  Study Result   CLINICAL DATA:  Choking, left-sided chest pain for 1 week.  EXAM: CHEST  2 VIEW  COMPARISON:  Chest x-rays dated 10/14/2015 05/16/2015.  FINDINGS: Heart size is upper normal, stable. Overall cardiomediastinal silhouette is stable. Atherosclerotic changes again noted at the aortic arch. Median sternotomy wires are stable in position, for presumed CABG.  Lungs are clear. Lung volumes are normal. No pleural effusion or pneumothorax seen. Tracheal air column in the mid line. No acute or suspicious osseous finding.  IMPRESSION: No active cardiopulmonary disease. No evidence of pneumonia or pulmonary edema.   Electronically Signed   By: Franki Cabot M.D.   On: 07/19/2016 17:15    Study Result   CLINICAL  DATA:  Chest pain worsening cough with choking sensation  EXAM: CT ANGIOGRAPHY CHEST WITH CONTRAST  TECHNIQUE: Multidetector CT imaging of the chest was performed using the standard protocol during bolus administration of intravenous contrast. Multiplanar CT image reconstructions and MIPs were obtained to evaluate the  vascular anatomy.  CONTRAST:  75 mL Isovue 370.  COMPARISON:  None.  FINDINGS: Cardiovascular: Thoracic aorta shows changes consistent with coronary bypass grafting. Mild atherosclerotic calcifications are seen. No aneurysmal dilatation or dissection is noted. Pulmonary artery is well visualized with a normal branching pattern. No filling defects are identified  Mediastinum/Nodes: The thoracic inlet is within normal limits. No hilar or mediastinal adenopathy is identified.  Lungs/Pleura: Calcified granulomatous changes are noted. No acute infiltrate or sizable effusion is noted. No sizable parenchymal nodule is seen.  Upper Abdomen: Within normal limits.  Musculoskeletal: Mild degenerative change of the thoracic spine is noted.  Review of the MIP images confirms the above findings.  IMPRESSION: No evidence of pulmonary emboli.  Prior granulomatous disease.  No acute abnormality noted.   Electronically Signed   By: Inez Catalina M.D.   On: 07/19/2016 19:01   Study Result   CLINICAL DATA:  Choking sensation with trouble swallowing.  EXAM: CT NECK WITH CONTRAST  TECHNIQUE: Multidetector CT imaging of the neck was performed using the standard protocol following the bolus administration of intravenous contrast.  CONTRAST:  Study 5 cc Isovue 370 intravenous  COMPARISON:  Neck MRI 02/24/2012  FINDINGS: Pharynx and larynx: Negative.  No mass or swelling  Salivary glands: No inflammation, mass, or stone.  Thyroid: Normal.  Lymph nodes: None enlarged or abnormal density.  Vascular: Carotid bifurcation  atherosclerosis.  Limited intracranial: Negative  Visualized orbits: Negative  Mastoids and visualized paranasal sinuses: Clear  Skeleton: Incomplete segmentation at C2-3. No acute or aggressive finding.  Upper chest: Negative  Other: Superficial, simple lipoma in the low left posterior neck as described on comparison MRI.  IMPRESSION: No acute finding or explanation for symptoms.   Electronically Signed   By: Monte Fantasia M.D.   On: 07/19/2016 19:44     ____________________________________________   PROCEDURES  Procedure(s) performed:   Procedures  Critical Care performed:  ____________________________________________   INITIAL IMPRESSION / ASSESSMENT AND PLAN / ED COURSE  Pertinent labs & imaging results that were available during my care of the patient were reviewed by me and considered in my medical decision making (see chart for details).        ____________________________________________   FINAL CLINICAL IMPRESSION(S) / ED DIAGNOSES  Final diagnoses:  Dyspnea, unspecified type      NEW MEDICATIONS STARTED DURING THIS VISIT:  New Prescriptions   No medications on file     Note:  This document was prepared using Dragon voice recognition software and may include unintentional dictation errors.    Nena Polio, MD 07/19/16 2022 I should add that I discussed the patient with Dr. Doy Hutching. He reports that he and his and the patient's cardiologist see the patient weekly and is one of them sees the patient every week. Patient has been stable.   Nena Polio, MD 07/19/16 2023

## 2016-07-19 NOTE — Discharge Instructions (Signed)
This test that I can do in the hospital tonight are essentially okay I would have your regular doctor recheck you Monday. You can also follow up with your cardiologist. There is no sign of anything blocking or constricting your neck. CAT scan of the chest is also normal. Her oxygen level has been okay.

## 2016-07-19 NOTE — ED Notes (Signed)

## 2016-07-22 DIAGNOSIS — L97521 Non-pressure chronic ulcer of other part of left foot limited to breakdown of skin: Secondary | ICD-10-CM | POA: Diagnosis not present

## 2016-07-22 DIAGNOSIS — B351 Tinea unguium: Secondary | ICD-10-CM | POA: Diagnosis not present

## 2016-07-22 DIAGNOSIS — E1143 Type 2 diabetes mellitus with diabetic autonomic (poly)neuropathy: Secondary | ICD-10-CM | POA: Diagnosis not present

## 2016-07-26 ENCOUNTER — Other Ambulatory Visit (HOSPITAL_COMMUNITY): Payer: Self-pay | Admitting: Student

## 2016-08-05 DIAGNOSIS — I5033 Acute on chronic diastolic (congestive) heart failure: Secondary | ICD-10-CM | POA: Diagnosis not present

## 2016-08-05 DIAGNOSIS — E7800 Pure hypercholesterolemia, unspecified: Secondary | ICD-10-CM | POA: Diagnosis not present

## 2016-08-05 DIAGNOSIS — R0602 Shortness of breath: Secondary | ICD-10-CM | POA: Diagnosis not present

## 2016-08-05 DIAGNOSIS — I480 Paroxysmal atrial fibrillation: Secondary | ICD-10-CM | POA: Diagnosis not present

## 2016-08-05 DIAGNOSIS — I350 Nonrheumatic aortic (valve) stenosis: Secondary | ICD-10-CM | POA: Diagnosis not present

## 2016-08-05 DIAGNOSIS — I25810 Atherosclerosis of coronary artery bypass graft(s) without angina pectoris: Secondary | ICD-10-CM | POA: Diagnosis not present

## 2016-08-05 DIAGNOSIS — I10 Essential (primary) hypertension: Secondary | ICD-10-CM | POA: Diagnosis not present

## 2016-08-06 IMAGING — CR DG CHEST 2V
2 series · 2 of 2 positions shown · non-contrast
Comparison: Chest radiograph performed 10/14/2014

CLINICAL DATA: Subacute onset of difficulty breathing for 2 weeks.
Shortness of breath. Initial encounter.

EXAM:
CHEST  2 VIEW

[chest pa]
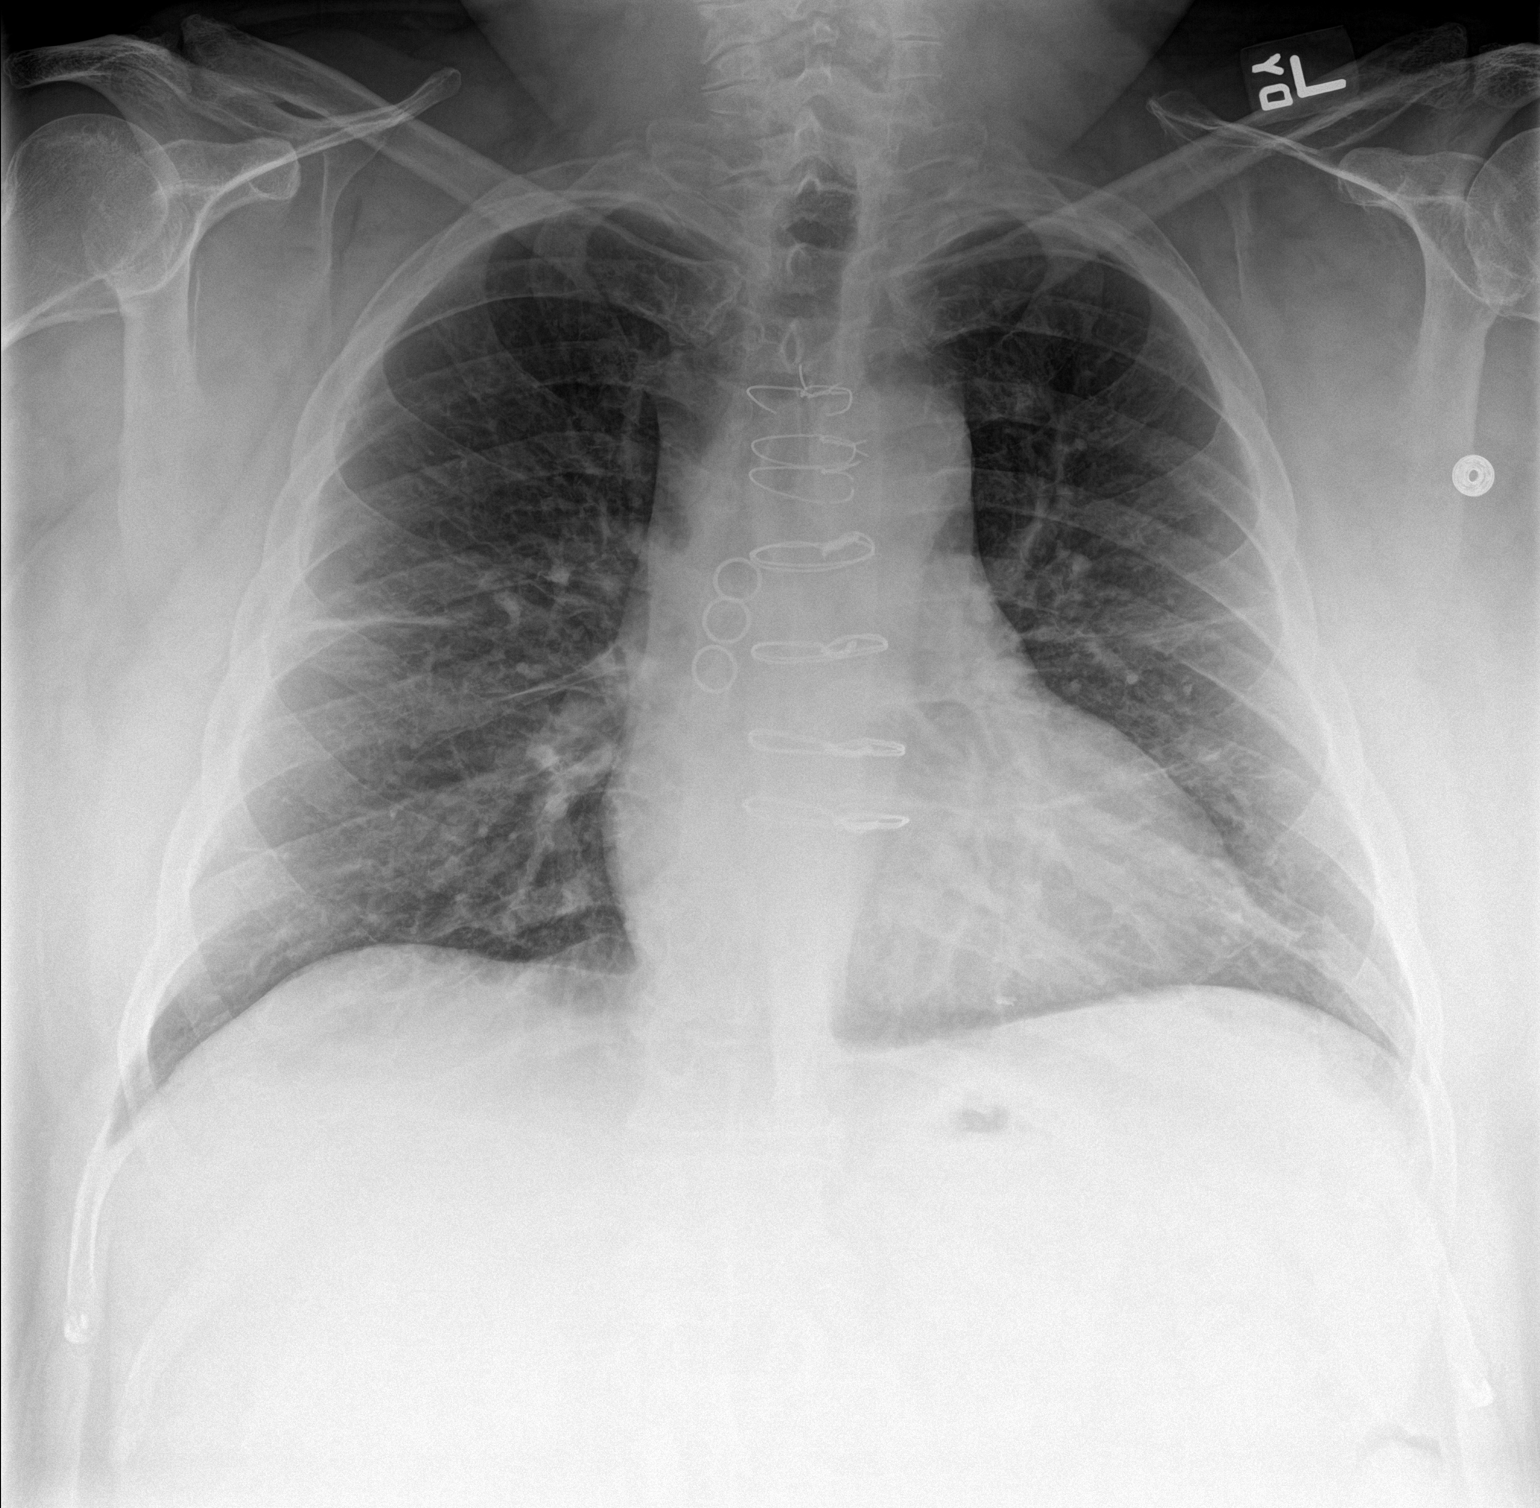

[chest lat]
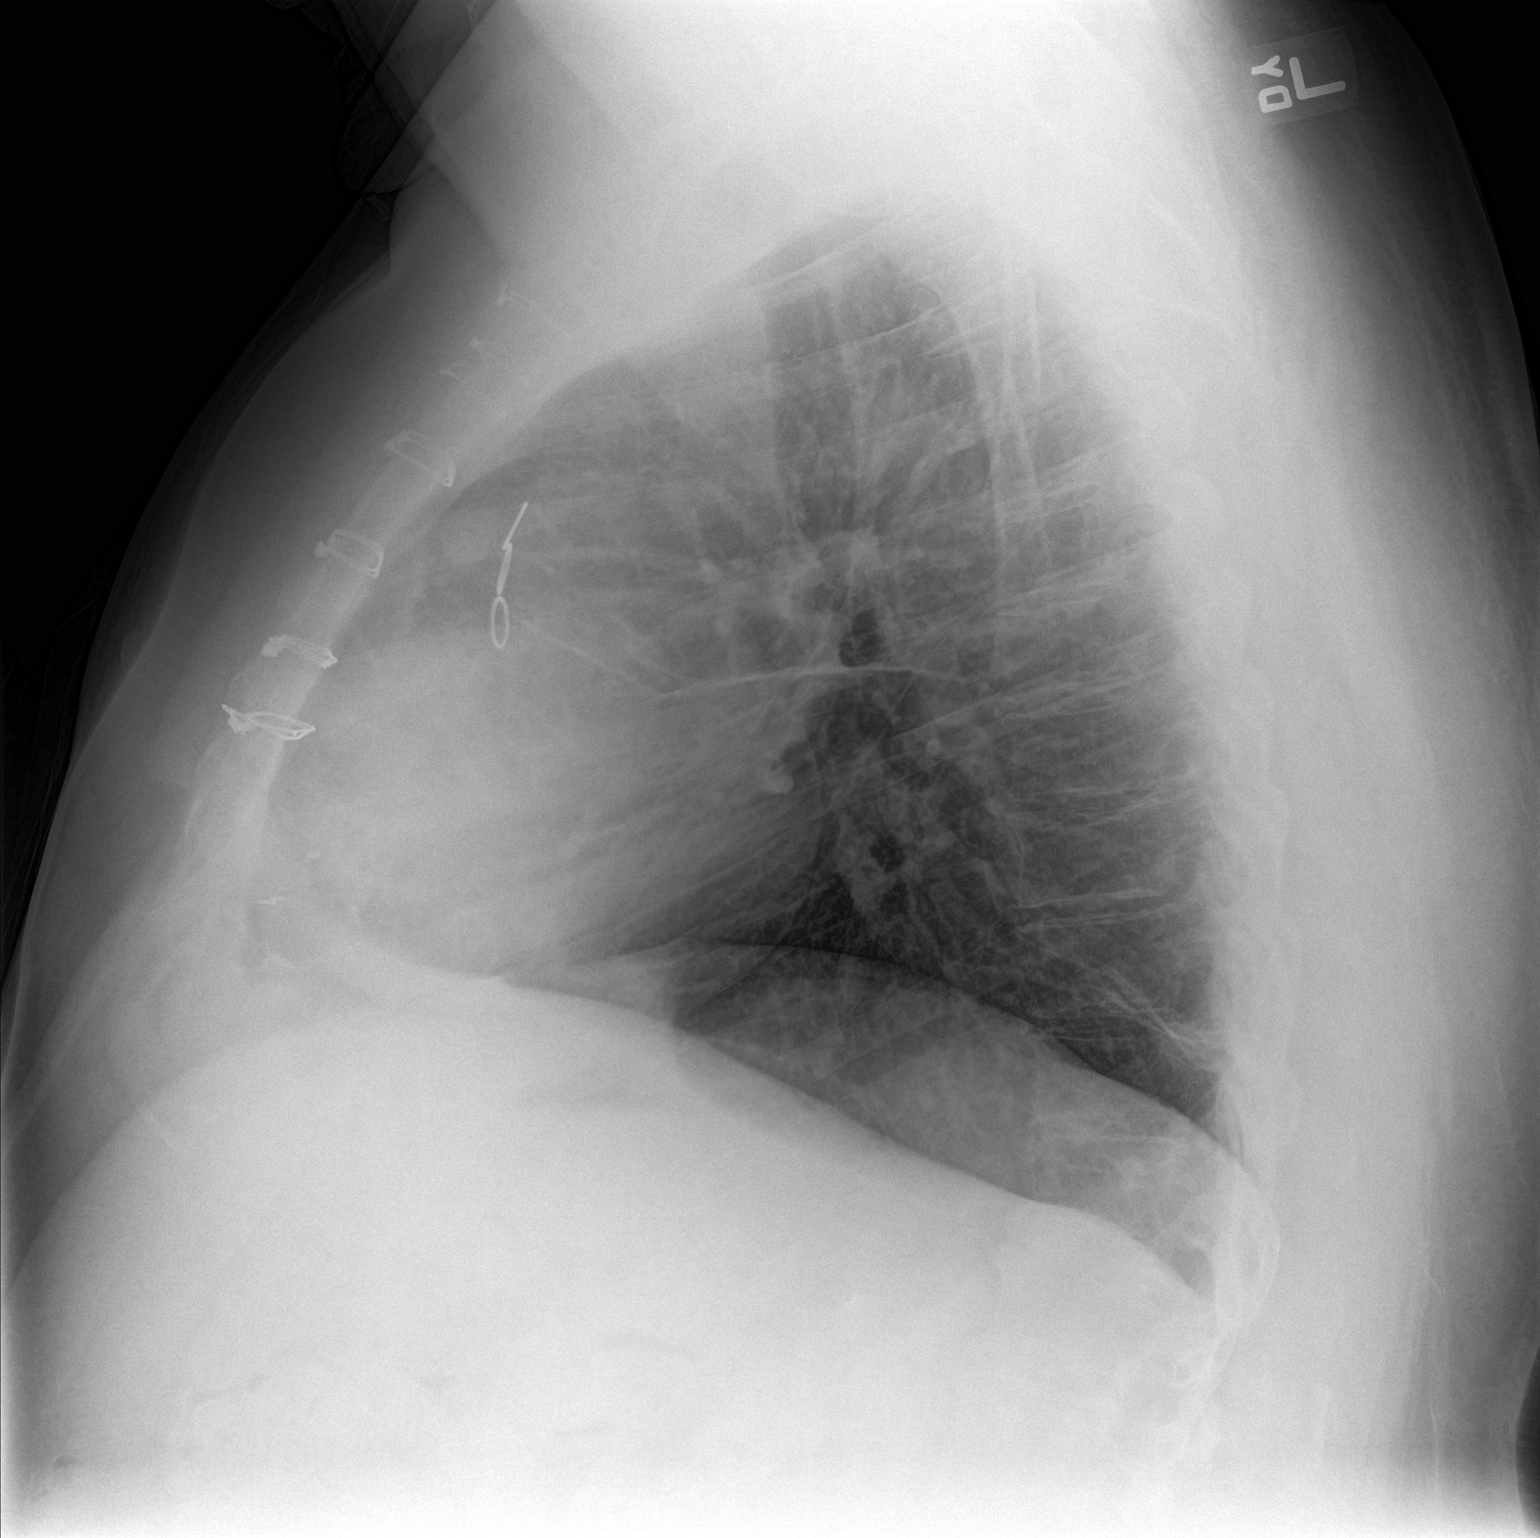

[2 of 2 positions shown; findings below may reference images not displayed]

FINDINGS: The lungs are well-aerated. Patchy bilateral atelectasis is noted.
Pulmonary vascularity is at the upper limits of normal. There is no
evidence of pleural effusion or pneumothorax.

The heart is borderline normal in size. The patient is status post
median sternotomy, with evidence of prior CABG. Several fractured
sternal wires are seen. No acute osseous abnormalities are seen.
IMPRESSION: Patchy bilateral atelectasis noted.  Lungs otherwise clear.

## 2016-08-06 IMAGING — CT CT ANGIO CHEST
1 of 2 series · 18 of 30 positions shown · IV contrast (APPLIED)
Comparison: 07/19/2014

CLINICAL DATA: Dyspnea for 3 weeks.

EXAM:
CT ANGIOGRAPHY CHEST WITH CONTRAST
TECHNIQUE: Multidetector CT imaging of the chest was performed using the
standard protocol during bolus administration of intravenous
contrast. Multiplanar CT image reconstructions and MIPs were
obtained to evaluate the vascular anatomy.
CONTRAST:  100mL OMNIPAQUE IOHEXOL 350 MG/ML SOLN

[Series 5: pe 1.0 thins · axial · 0.79mm/px · z∈[-604,-356]mm · 18 of 281 slices shown]
[im 16/281  lung]
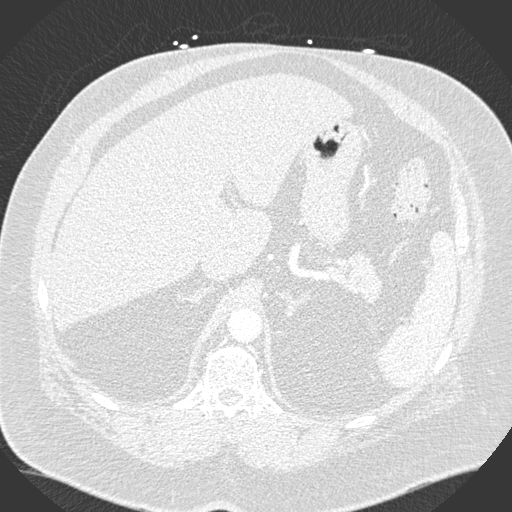
[im 32/281  mediastinal]
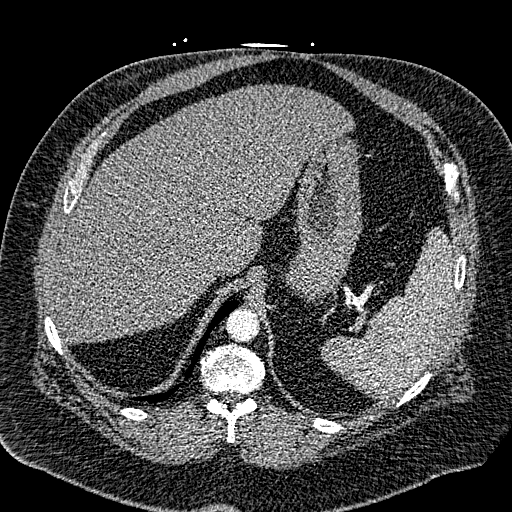
[im 47/281  lung]
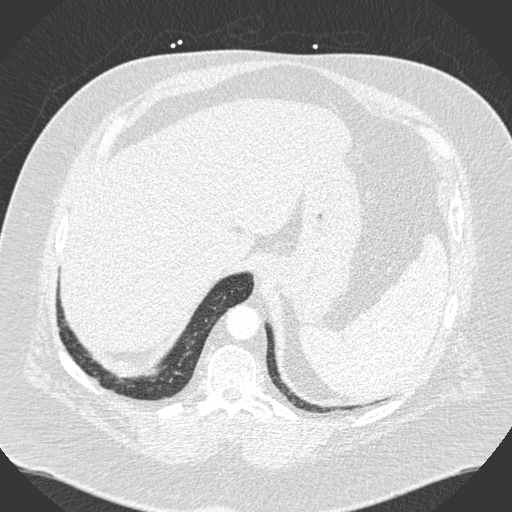
[im 63/281  mediastinal]
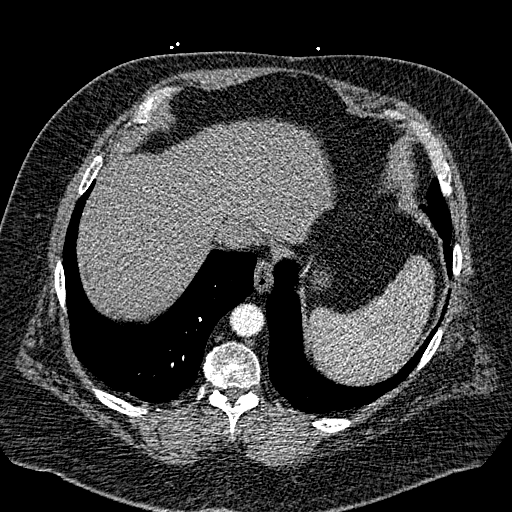
[im 78/281  lung]
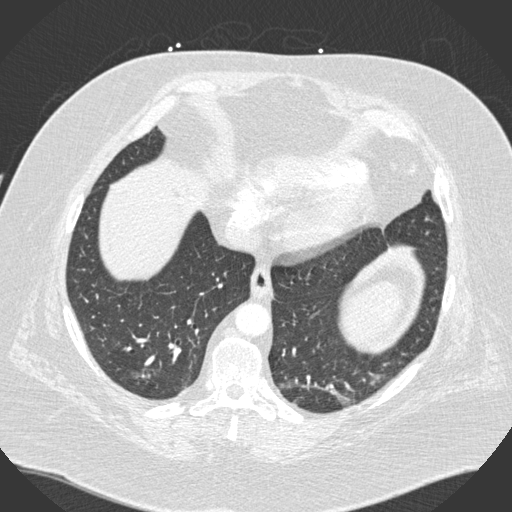
[im 94/281  mediastinal]
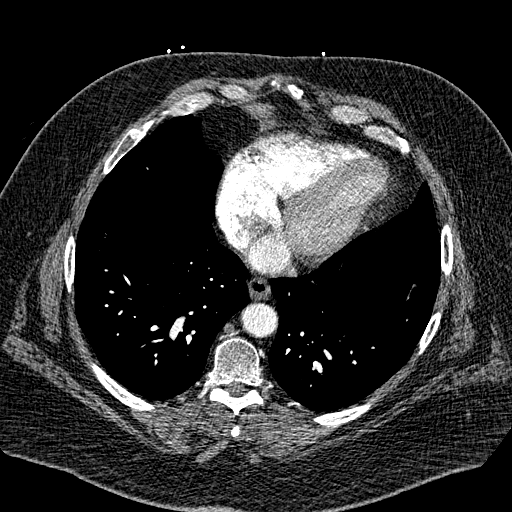
[im 109/281  lung]
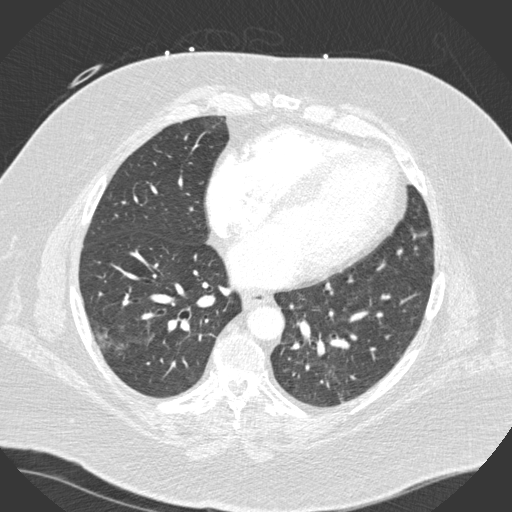
[im 125/281  mediastinal]
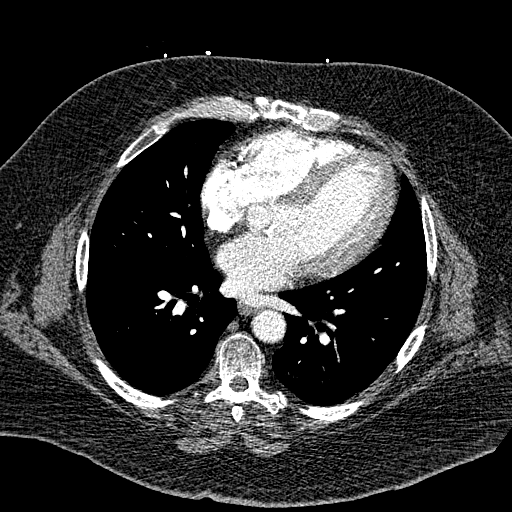
[im 131/281  lung]
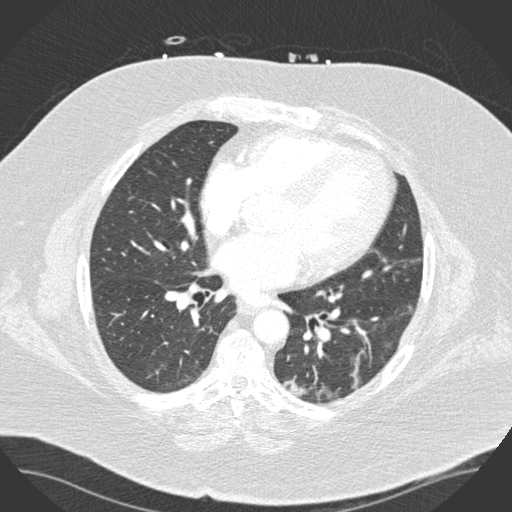
[im 141/281  mediastinal]
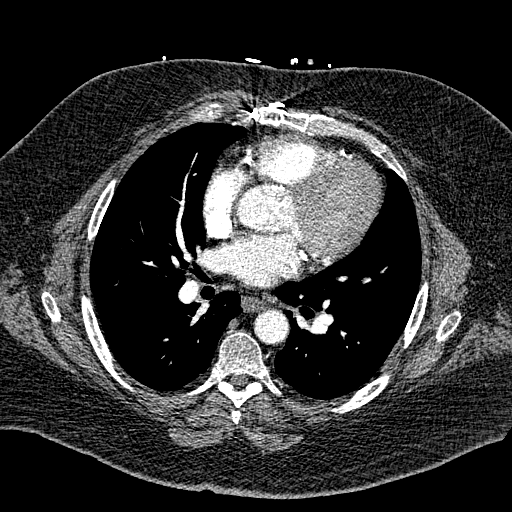
[im 156/281  lung]
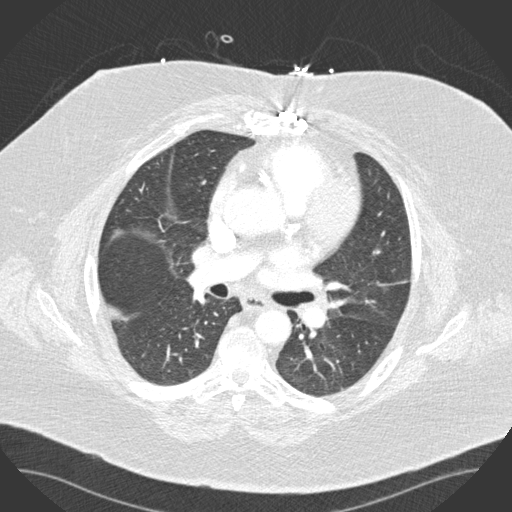
[im 172/281  mediastinal]
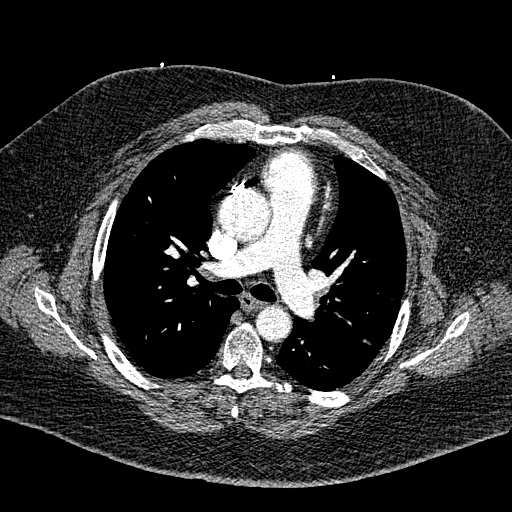
[im 187/281  lung]
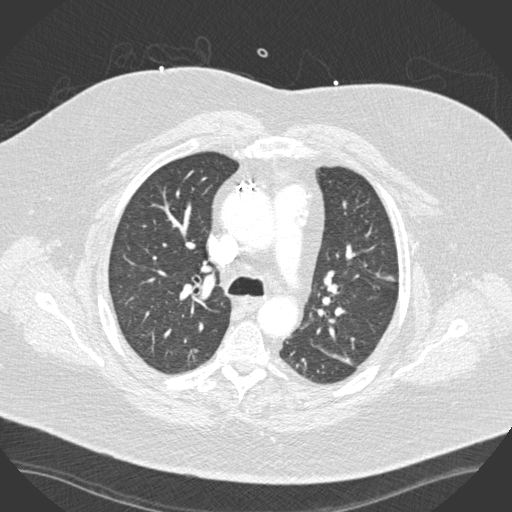
[im 203/281  mediastinal]
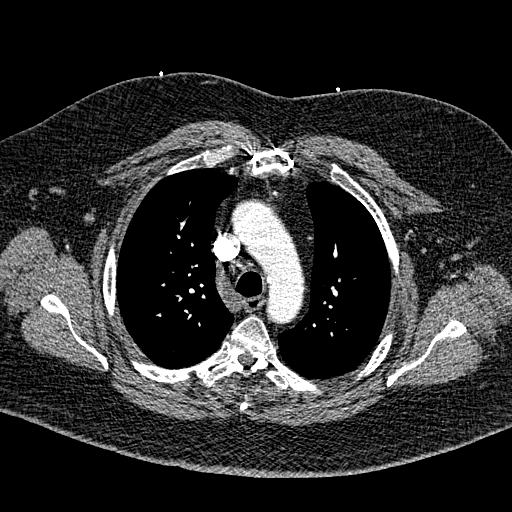
[im 218/281  lung]
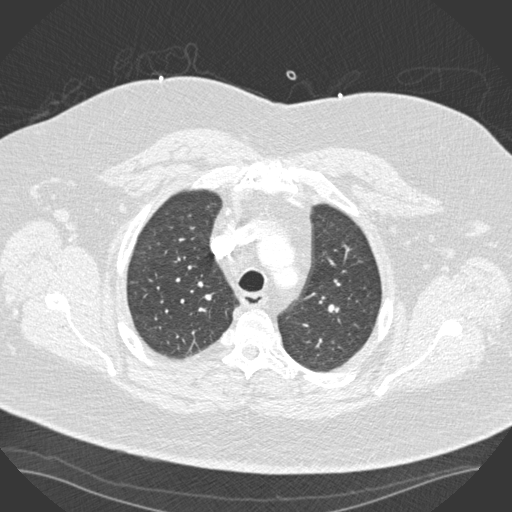
[im 234/281  mediastinal]
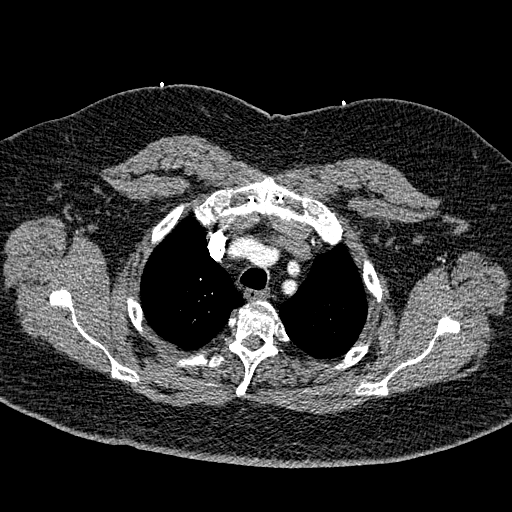
[im 249/281  lung]
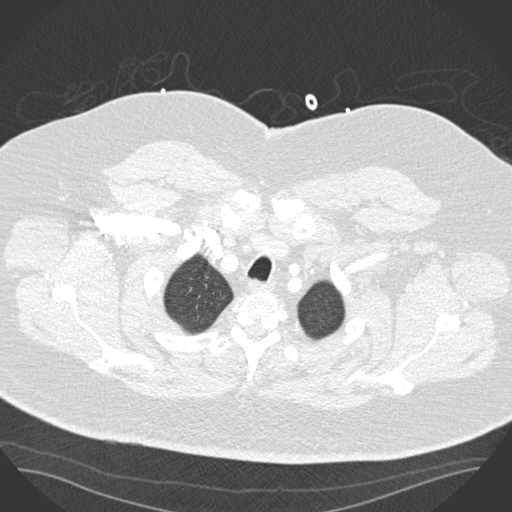
[im 265/281  mediastinal]
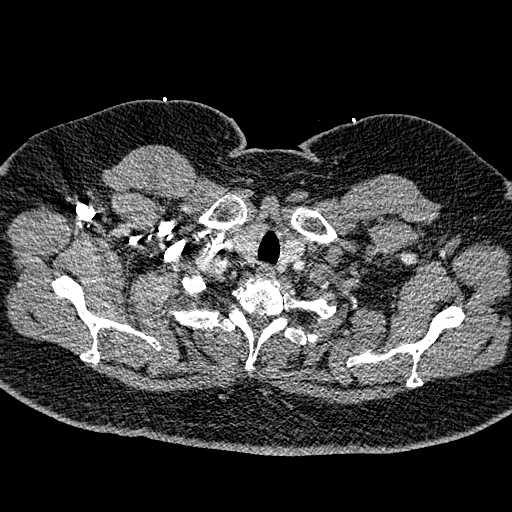

[18 of 30 positions shown; findings below may reference images not displayed]

FINDINGS: Mediastinum: The patient is status post median sternotomy and CABG
procedure. Normal heart size. No pericardial effusion. Aortic
atherosclerosis noted. Trachea is patent and appears midline. Normal
appearance of the esophagus. No mediastinal or hilar adenopathy. No
focal liver abnormalities. The main pulmonary artery is patent. No
lobar or segmental pulmonary artery filling defects identified.

Lungs/Pleura: There is no pleural effusion identified. Bilateral
upper lobe subsegmental atelectasis noted. Mild lower lobe
subsegmental atelectasis also noted. Stable 3 mm nodule in the left
lower lobe, image 88/series 6 compatible with a benign process.
Small granulomas also noted in the left lower lobe. Right lower lobe
granuloma noted.

Upper Abdomen: Mild diffuse hepatic steatosis. Spleen is
unremarkable. The adrenal glands are both normal. The visualized
portions of the pancreas are unremarkable.

Musculoskeletal: No aggressive lytic or sclerotic bone lesions.

Review of the MIP images confirms the above findings.
IMPRESSION: 1. No evidence for acute pulmonary embolus.
2. Aortic atherosclerosis.
3. Bilateral upper lobe subsegmental atelectasis.

## 2016-08-14 DIAGNOSIS — R0602 Shortness of breath: Secondary | ICD-10-CM | POA: Diagnosis not present

## 2016-08-14 DIAGNOSIS — E1143 Type 2 diabetes mellitus with diabetic autonomic (poly)neuropathy: Secondary | ICD-10-CM | POA: Diagnosis not present

## 2016-08-14 DIAGNOSIS — J4540 Moderate persistent asthma, uncomplicated: Secondary | ICD-10-CM | POA: Diagnosis not present

## 2016-08-14 DIAGNOSIS — E7800 Pure hypercholesterolemia, unspecified: Secondary | ICD-10-CM | POA: Diagnosis not present

## 2016-08-14 DIAGNOSIS — I10 Essential (primary) hypertension: Secondary | ICD-10-CM | POA: Diagnosis not present

## 2016-08-14 DIAGNOSIS — I480 Paroxysmal atrial fibrillation: Secondary | ICD-10-CM | POA: Diagnosis not present

## 2016-08-15 ENCOUNTER — Telehealth: Payer: Self-pay | Admitting: Internal Medicine

## 2016-08-15 NOTE — Telephone Encounter (Signed)
Received paper referral from Dr. Doy Hutching for Routine referral dx Chronic SOB and cough   LMOV to call office to schedule.  Patient established with Mortimer Fries and last see 04/13/15.

## 2016-08-18 ENCOUNTER — Emergency Department: Payer: Medicare HMO

## 2016-08-18 ENCOUNTER — Emergency Department
Admission: EM | Admit: 2016-08-18 | Discharge: 2016-08-19 | Disposition: A | Discharge status: Home / Self Care | Payer: Medicare HMO | Attending: Emergency Medicine | Admitting: Emergency Medicine

## 2016-08-18 ENCOUNTER — Encounter: Payer: Self-pay | Admitting: *Deleted

## 2016-08-18 DIAGNOSIS — R0789 Other chest pain: Secondary | ICD-10-CM | POA: Diagnosis not present

## 2016-08-18 DIAGNOSIS — R079 Chest pain, unspecified: Secondary | ICD-10-CM

## 2016-08-18 DIAGNOSIS — Z794 Long term (current) use of insulin: Secondary | ICD-10-CM | POA: Diagnosis not present

## 2016-08-18 DIAGNOSIS — I2510 Atherosclerotic heart disease of native coronary artery without angina pectoris: Secondary | ICD-10-CM | POA: Diagnosis not present

## 2016-08-18 DIAGNOSIS — J449 Chronic obstructive pulmonary disease, unspecified: Secondary | ICD-10-CM | POA: Diagnosis not present

## 2016-08-18 DIAGNOSIS — E119 Type 2 diabetes mellitus without complications: Secondary | ICD-10-CM | POA: Insufficient documentation

## 2016-08-18 DIAGNOSIS — I110 Hypertensive heart disease with heart failure: Secondary | ICD-10-CM | POA: Insufficient documentation

## 2016-08-18 DIAGNOSIS — Z7982 Long term (current) use of aspirin: Secondary | ICD-10-CM | POA: Diagnosis not present

## 2016-08-18 DIAGNOSIS — I5022 Chronic systolic (congestive) heart failure: Secondary | ICD-10-CM | POA: Diagnosis not present

## 2016-08-18 LAB — COMPREHENSIVE METABOLIC PANEL
ALT: 37 U/L (ref 17–63)
AST: 37 U/L (ref 15–41)
Albumin: 3.9 g/dL (ref 3.5–5.0)
Alkaline Phosphatase: 54 U/L (ref 38–126)
Anion gap: 9 (ref 5–15)
BUN: 19 mg/dL (ref 6–20)
CHLORIDE: 102 mmol/L (ref 101–111)
CO2: 25 mmol/L (ref 22–32)
CREATININE: 1.06 mg/dL (ref 0.61–1.24)
Calcium: 8.9 mg/dL (ref 8.9–10.3)
GFR calc Af Amer: >60 mL/min (ref >60)
Glucose, Bld: 259 mg/dL — ABNORMAL HIGH (ref 65–99)
Potassium: 3.5 mmol/L (ref 3.5–5.1)
Sodium: 136 mmol/L (ref 135–145)
TOTAL PROTEIN: 6.8 g/dL (ref 6.5–8.1)
Total Bilirubin: 0.5 mg/dL (ref 0.3–1.2)

## 2016-08-18 LAB — BRAIN NATRIURETIC PEPTIDE: B Natriuretic Peptide: 184 pg/mL — ABNORMAL HIGH (ref 0.0–100.0)

## 2016-08-18 LAB — CBC WITH DIFFERENTIAL/PLATELET
BASOS PCT: 1 %
Basophils Absolute: 0.1 10*3/uL (ref 0–0.1)
EOS ABS: 0.4 10*3/uL (ref 0–0.7)
Eosinophils Relative: 6 %
HEMATOCRIT: 41.8 % (ref 40.0–52.0)
Hemoglobin: 14.7 g/dL (ref 13.0–18.0)
Lymphocytes Relative: 29 %
Lymphs Abs: 2 10*3/uL (ref 1.0–3.6)
MCH: 30.7 pg (ref 26.0–34.0)
MCHC: 35.1 g/dL (ref 32.0–36.0)
MCV: 87.4 fL (ref 80.0–100.0)
MONO ABS: 0.7 10*3/uL (ref 0.2–1.0)
MONOS PCT: 10 %
Neutro Abs: 3.7 10*3/uL (ref 1.4–6.5)
Neutrophils Relative %: 54 %
Platelets: 162 10*3/uL (ref 150–440)
RBC: 4.78 MIL/uL (ref 4.40–5.90)
RDW: 13.4 % (ref 11.5–14.5)
WBC: 6.9 10*3/uL (ref 3.8–10.6)

## 2016-08-18 LAB — TROPONIN I: TROPONIN I: 0.04 ng/mL — AB (ref <0.03)

## 2016-08-18 IMAGING — CR DG CHEST 1V PORT
1 series · 1 of 1 positions shown · non-contrast
Comparison: Chest radiograph and chest CT December 10, 2014

CLINICAL DATA: Shortness of breath for 1 day

EXAM:
PORTABLE CHEST - 1 VIEW

[ap]
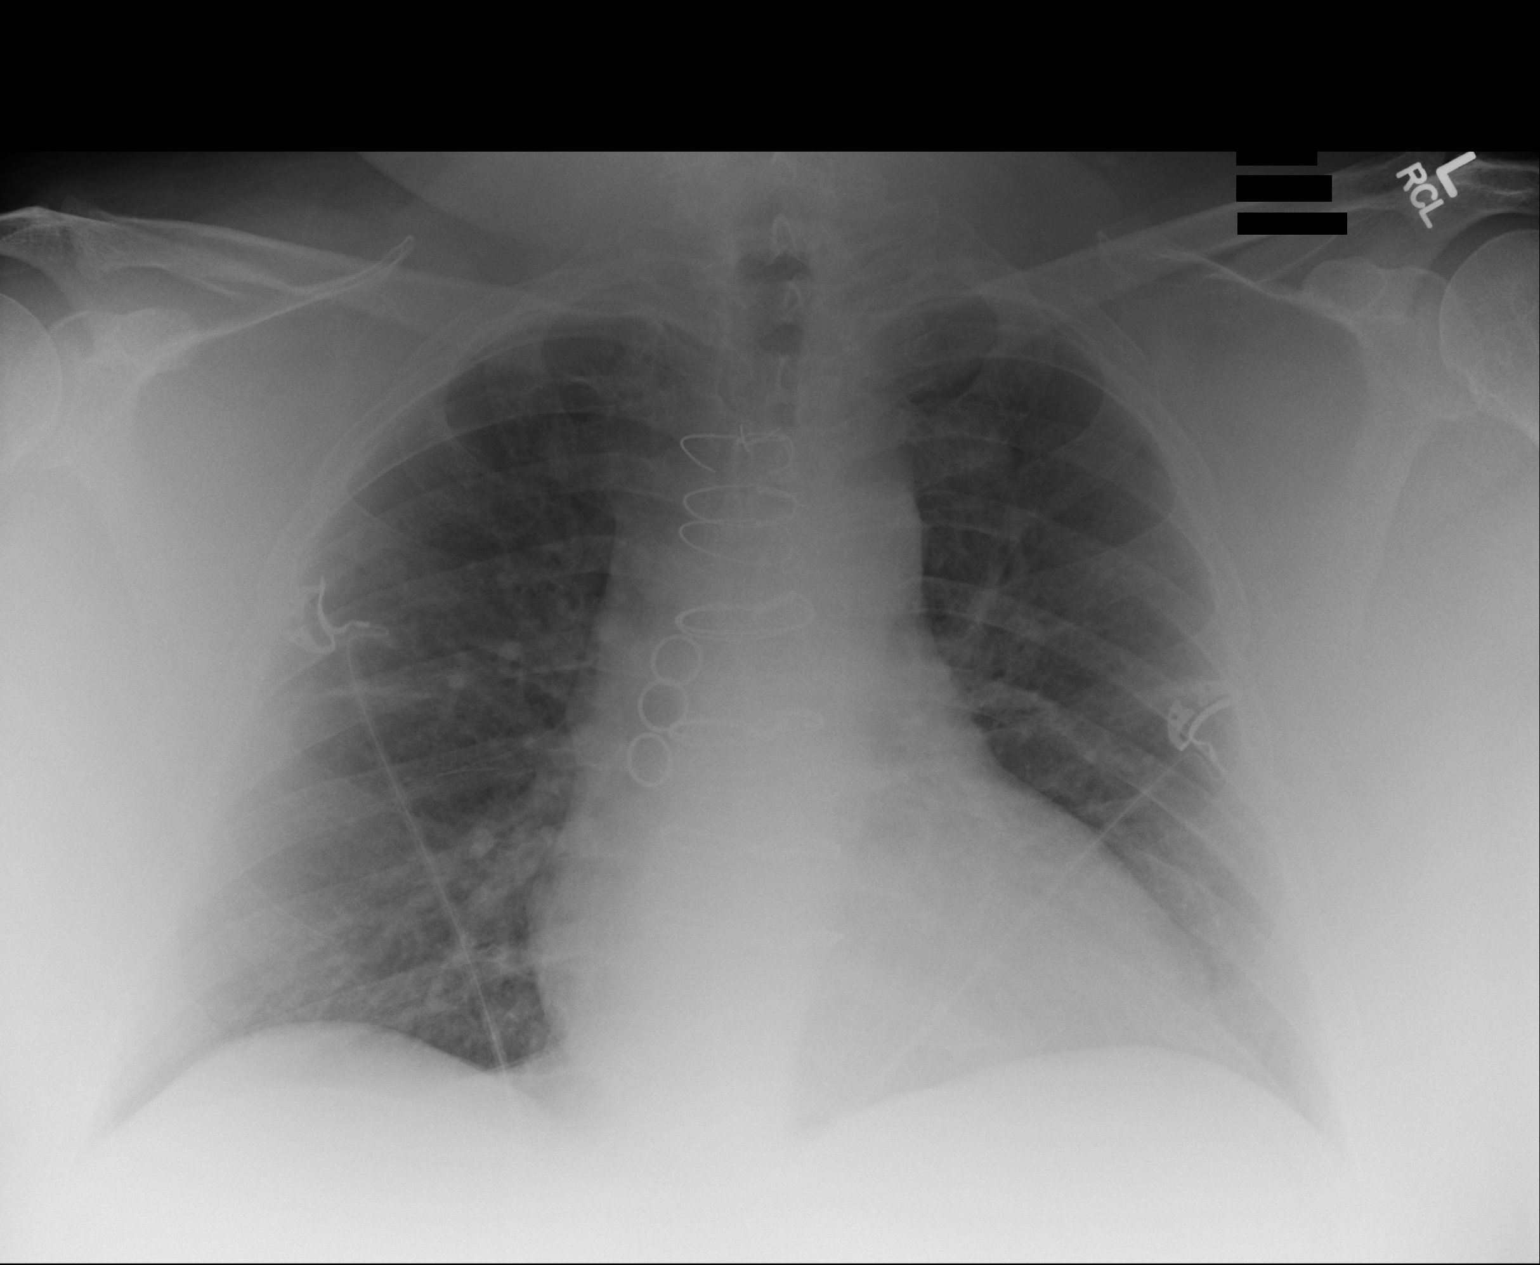

[1 of 1 positions shown; findings below may reference images not displayed]

FINDINGS: Slight scarring in each mid lung is stable. There is no edema or
consolidation. Heart size and pulmonary vascularity are normal. No
adenopathy. Patient is status post coronary artery bypass grafting.
IMPRESSION: Scattered areas of mild scarring.  No edema or consolidation.

## 2016-08-18 MED ORDER — ASPIRIN 81 MG PO CHEW
324.0000 mg | CHEWABLE_TABLET | Freq: Once | ORAL | Status: AC
  Administered 2016-08-18: 324 mg via ORAL
  Filled 2016-08-18: qty 4

## 2016-08-18 MED ORDER — KETOROLAC TROMETHAMINE 30 MG/ML IJ SOLN
15.0000 mg | Freq: Once | INTRAMUSCULAR | Status: AC
  Administered 2016-08-18: 15 mg via INTRAVENOUS
  Filled 2016-08-18: qty 1

## 2016-08-18 NOTE — ED Notes (Signed)
Lab called to report a critical high troponin of 0.04 Dr. Burlene Arnt was notified.

## 2016-08-18 NOTE — ED Notes (Signed)
Pt returned from X Ray.

## 2016-08-18 NOTE — ED Notes (Signed)
Pt went to X-Ray.  

## 2016-08-18 NOTE — ED Triage Notes (Signed)
Pt c/o L chest pain radiating to L arm and back since 1630. Pt states he took nitroglycerin SL x 1 tab at 1900 w/o relief. Pt is flushed, short of breath in waiting room. Pt has cardiac and respiratory hx.

## 2016-08-18 NOTE — ED Provider Notes (Addendum)
[facility] Emergency Department Provider Note  ____________________________________________   I have reviewed the triage vital signs and the nursing notes.   HISTORY  Chief Complaint Chest Pain    HPI Marcus Williams is a 61 y.o. male extensive past medical history including atrial fibrillation, CAD, who complains of left-sided chest wall pain that has been there all day. It started with a left bicep pain which is been there for several days. It hurts when he moves or change positions. Hurts when he pulls up her touches his chest. It hurts when he lifts his arm up. Can't remember how he injured it. Denies any fever or chills. States he always has mild shortness of breath and doesn't feel he is more short of breath than normal. Denies diaphoresis. He does not think that this is like his prior ACS. Pain is been uninterrupted in his chest today and the exact history should've his pectoralis muscle and he can specify the exact points where it hurts, since around 3:00. Nitroglycerin did not help it. Patient states that the pain in his arm, and the bicep region has been there since 3 days ago. He does taking they have picked up something heavy at some point a Murmur exactly what he did. In any event, he denies any exertional chest pain he denies a pleuritic chest pain he denies any shortness of breath over his baseline. He denies E nausea or diaphoresis. The pain is better when he is not moving it although is not clearly gone is worse when he touches it or changes position.      Past Medical History:  Diagnosis Date  . A-fib ([place])   . Aortic stenosis, moderate 11/2015  . Arthritis   . CAD (coronary artery disease)   . Chronic systolic CHF (congestive heart failure) ([place])   . Diabetes mellitus without complication ([place])    INSULIN DEPENDENT  . GERD (gastroesophageal reflux disease)   . History of cardioversion   . Hypertension   . MI (myocardial infarction)   . OSA on  CPAP   . Sepsis ([place])   . Shortness of breath dyspnea   . SVT (supraventricular tachycardia) [facility]     Patient Active Problem List   Diagnosis Date Noted  . Aortic stenosis 11/21/2015  . Acute on chronic diastolic CHF (congestive heart failure), NYHA class 3 ([place]) 11/21/2015  . Aortic valve, bicuspid 09/24/2015  . Cellulitis 05/04/2015  . Acute respiratory failure with hypoxia ([place]) 03/26/2015  . CHF (congestive heart failure) ([place]) 01/15/2015  . HTN (hypertension) 01/14/2015  . Type II diabetes mellitus ([place]) 01/14/2015  . COPD (chronic obstructive pulmonary disease) ([place]) 01/14/2015  . OSA on CPAP 01/14/2015  . CAD (coronary artery disease) 01/14/2015  . A-fib ([place]) 01/14/2015  . Pain of left calf 01/14/2015  . Acute on chronic systolic CHF (congestive heart failure) ([place]) 01/14/2015  . COPD exacerbation ([place]) 12/22/2014  . Diabetes ([place]) 10/14/2014    Past Surgical History:  Procedure Laterality Date  . 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 BUNIONECTOMY    . CARDIAC CATHETERIZATION N/A 10/02/2015   Procedure: Coronary/Grafts Angiography;  Surgeon: Teodoro Spray, MD;  Location: [place] CV LAB;  Service: Cardiovascular;  Laterality: N/A;  . CARDIAC CATHETERIZATION N/A 11/21/2015   Procedure: Right and Left Heart Cath;  Surgeon: Sherren Mocha, MD;  Location: [place] CV LAB;  Service: Cardiovascular;  Laterality: N/A;  . CORONARY ARTERY BYPASS GRAFT    . KNEE SURGERY    . quadruple bypass    . TEE WITHOUT CARDIOVERSION  11/21/2015  . TEE WITHOUT CARDIOVERSION N/A 11/21/2015   Procedure: TRANSESOPHAGEAL ECHOCARDIOGRAM (TEE);  Surgeon: Larey Dresser, MD;  Location: [place];  Service: Cardiovascular;  Laterality: N/A;    Prior to Admission medications   Medication Sig Start Date End Date Taking? Authorizing Provider  albuterol (PROVENTIL HFA;VENTOLIN  HFA) 108 (90 Base) MCG/ACT inhaler Inhale 2 puffs into the lungs every 6 (six) hours as needed for wheezing or shortness of breath. 07/11/15   Flora Lipps, MD  aspirin 81 MG EC tablet Take 1 tablet (81 mg total) by mouth daily. 03/10/16   Sherren Mocha, MD  diltiazem (CARDIZEM CD) 300 MG 24 hr capsule Take 1 capsule (300 mg total) by mouth daily. 10/15/14   Idelle Crouch, MD  docusate sodium (COLACE) 100 MG capsule Take 100 mg by mouth 2 (two) times daily.    Historical Provider, MD  ferrous sulfate 325 (65 FE) MG tablet Take 1 tablet (325 mg total) by mouth daily with breakfast. 05/16/16   Amy D Clegg, NP  fluticasone (FLONASE) 50 MCG/ACT nasal spray Place 1 spray into both nostrils daily as needed for allergies.     Historical Provider, MD  Fluticasone-Salmeterol (ADVAIR DISKUS) 250-50 MCG/DOSE AEPB Inhale 1 puff into the lungs 2 (two) times daily as needed (For shortness of breath.).     Historical Provider, MD  insulin NPH Human (HUMULIN N,NOVOLIN N) 100 UNIT/ML injection Inject 60 Units into the skin 2 (two) times daily.     Historical Provider, MD  metFORMIN (GLUCOPHAGE) 1000 MG tablet Take 1,000 mg by mouth 2 (two) times daily with a meal.    Historical Provider, MD  metoprolol (LOPRESSOR) 100 MG tablet Take 100 mg by mouth 2 (two) times daily. 10/15/15   Historical Provider, MD  nitroGLYCERIN (NITROSTAT) 0.4 MG SL tablet Place 0.4 mg under the tongue every 5 (five) minutes as needed for chest pain.    Historical Provider, MD  pantoprazole (PROTONIX) 40 MG tablet Take 40 mg by mouth daily.    Historical Provider, MD  potassium chloride 20 MEQ TBCR Take 20 mEq by mouth daily. 11/23/15   Shirley Friar, PA-C  Rivaroxaban (XARELTO) 20 MG TABS tablet Take 1 tablet (20 mg total) by mouth daily with breakfast. Patient not taking: Reported on 07/19/2016 11/23/15   Shirley Friar, PA-C  simvastatin (ZOCOR) 40 MG tablet Take 40 mg by mouth at bedtime.     Historical Provider, MD  torsemide  (DEMADEX) 20 MG tablet TAKE 3 TABLETS (60 MG TOTAL) BY MOUTH DAILY. 07/16/16   Shirley Friar, PA-C  traZODone (DESYREL) 100 MG tablet Take 100 mg by mouth at bedtime.    Historical Provider, MD    Allergies Patient has no known allergies.  Family History  Problem Relation Age of Onset  . Stroke Mother   . Heart attack Mother   . Hypertension Mother   . Heart attack Father   . Hypertension Father   . Heart attack Brother     #1  . Diabetes Brother     #1  . Heart disease Brother     #2  . Lung disease Brother     #2  . Hypertension Brother     #2  . Diabetes Brother     #2    Social History  Social History  Substance Use Topics  . Smoking status: Never Smoker  . Smokeless tobacco: Never Used  . Alcohol use No    Review of Systems  Constitutional: No fever/chills Eyes: No visual changes. ENT: No sore throat. No stiff neck no neck pain Cardiovascular: Positive chest pain. Respiratory: Denies shortness of breath. Over baseline Gastrointestinal:   no vomiting.  No diarrhea.  No constipation. Genitourinary: Negative for dysuria. Musculoskeletal: Negative lower extremity swelling Skin: Negative for rash. Neurological: Negative for severe headaches, focal weakness or numbness. 10-point ROS otherwise negative.  ____________________________________________   PHYSICAL EXAM:  VITAL SIGNS: ED Triage Vitals  Enc Vitals Group     BP 08/18/16 2050 (!) 176/63     Pulse Rate 08/18/16 2050 (!) 55     Resp 08/18/16 2050 20     Temp 08/18/16 2050 97.7 F (36.5 C)     Temp Source 08/18/16 2050 Oral     SpO2 08/18/16 2050 97 %     Weight 08/18/16 2051 297 lb (134.7 kg)     Height 08/18/16 2051 5\' 8"  (1.727 m)     Head Circumference --      Peak Flow --      Pain Score 08/18/16 2052 9     Pain Loc --      Pain Edu? --      Excl. in [place]? --     Constitutional: Alert and oriented. Well appearing and in no acute distress. Eyes: Conjunctivae are normal. PERRL.  EOMI. Head: Atraumatic. Nose: No congestion/rhinnorhea. Mouth/Throat: Mucous membranes are moist.  Oropharynx non-erythematous. Neck: No stridor.   Nontender with no meningismus Chest: Tender to palpation in the L chest wall. When I touch his pectoralis muscle, he says 'ouch, that's the pain right there!' and pulls back. There is no crepitous no flail chest there is no erythema or evidence of shingles or abscess. The pain is worse when the pt pulls up in the bed or changes position. He feels immediately better when he stops using those muscles.  When I have him range his arm it hurts his chest as well.    Cardiovascular: Normal rate, regular rhythm. Grossly normal heart sounds.  Good peripheral circulation. Respiratory: Normal respiratory effort.  No retractions. Lungs CTAB. Abdominal: Soft and nontender. No distention. No guarding no rebound Back:  There is no focal tenderness or step off.  there is no midline tenderness there are no lesions noted. there is no CVA tenderness Musculoskeletal: No lower extremity tenderness, no upper extremity tenderness. No joint effusions, no DVT signs strong distal pulses no edema Neurologic:  Normal speech and language. No gross focal neurologic deficits are appreciated.  Skin:  Skin is warm, dry and intact. No rash noted. Psychiatric: Mood and affect are normal. Speech and behavior are normal.  ____________________________________________   LABS (all labs ordered are listed, but only abnormal results are displayed)  Labs Reviewed  CBC WITH DIFFERENTIAL/PLATELET  BRAIN NATRIURETIC PEPTIDE  TROPONIN I  COMPREHENSIVE METABOLIC PANEL   ____________________________________________  EKG  I personally interpreted any EKGs ordered by me or triage Respiratory reviewed 4 bpm, no acute ST elevation or depression, partial right bundle-branch block noted. ST changes likely associated with that  noted. ____________________________________________  [id number]  I reviewed any imaging ordered by me or triage that were performed during my shift and, if possible, patient and/or family made aware of any abnormal findings. ____________________________________________   PROCEDURES  Procedure(s) performed: None  Procedures  Critical Care performed: None  ____________________________________________   INITIAL IMPRESSION / ASSESSMENT AND PLAN / ED COURSE  Pertinent labs & imaging results that were available during my care of the patient were reviewed by me and considered in my medical decision making (see chart for details).  Patient with very reproducible chest wall pain for 6 hours which she started with a very reproducible bicep pain 3 days ago which is been constantly there. On physical exam, there is very low suspicion for acute coronary syndrome. Patient is quite anxious about this. We will give him pain medication here and I have reassured him that thus far his findings are not exactly consistent with ACS. Nonetheless given his coronary history we'll send cardiac markers and reassess. ----------------------------------------- 10:36 PM on 08/18/2016 -----------------------------------------  Resting comfortably, asleep in the room. Blood pressure reflectsthat he is no longer quite as anxious as he was when he came in. In any event, borderline troponin is noted. This is a same and has been in the past. I did call his cardiologist who knows him very well, and saw him recently, Dr. Ubaldo Glassing, we discussed his physical exam, his blood work, his past medical history which was unknown to Dr. Ubaldo Glassing, his lab findings and EKG findings etc. Including his vitals. Cardiology feels that if his second troponin is not elevated given his recent catheter a year ago with patent graft vessels and his history of non-cardiac chest pain, they would advise discharge with outpatient follow-up. If however his  troponin is trending up, they would therefore suggest admission. I will sign this out at the end of my shift Dr. Dineen Kid.    ____________________________________________   FINAL CLINICAL IMPRESSION(S) / ED DIAGNOSES  Final diagnoses:  None      This chart was dictated using voice recognition software.  Despite best efforts to proofread,  errors can occur which can change meaning.      Schuyler Amor, MD 08/18/16 2137    Schuyler Amor, MD 08/18/16 [phone]

## 2016-08-19 DIAGNOSIS — L97521 Non-pressure chronic ulcer of other part of left foot limited to breakdown of skin: Secondary | ICD-10-CM | POA: Diagnosis not present

## 2016-08-19 LAB — TROPONIN I: Troponin I: 0.04 ng/mL (ref <0.03)

## 2016-08-19 NOTE — ED Provider Notes (Addendum)
Patient without any chest pain at this time. Plan per Dr. Burlene Arnt was to follow up and makes her the patient did not have an upward trending troponin.  Physical Exam  BP (!) 143/52   Pulse (!) 56   Temp 97.7 F (36.5 C) (Oral)   Resp 18   Ht 5\' 8"  (1.727 m)   Wt 297 lb (134.7 kg)   SpO2 93%   BMI 45.16 kg/m  ----------------------------------------- 2:12 AM on 08/19/2016 -----------------------------------------   Physical Exam Patient without any distress at this time. Resting comfortably without any outward signs of pain.  ED Course  Procedures  MDM Patient with stable troponin. Will be discharged to home. Explained the plan to the patient is understanding when to comply. Will following up with his cardiologist, Dr. Ubaldo Glassing.         Orbie Pyo, MD 08/19/16 [phone]  Patient accepted by the ER physician at [facility], Dr. Vista Lawman.     Orbie Pyo, MD 08/19/16 [phone]

## 2016-08-19 NOTE — Telephone Encounter (Signed)
lmov to call office for scheduling.   °

## 2016-09-10 DIAGNOSIS — I350 Nonrheumatic aortic (valve) stenosis: Secondary | ICD-10-CM | POA: Diagnosis not present

## 2016-09-10 IMAGING — CR DG CHEST 2V
1 series · 2 of 2 positions shown · non-contrast
Comparison: [facility]-[REDACTED] and prior radiographs

CLINICAL DATA: Shortness of breath for 2 weeks.

EXAM:
CHEST  2 VIEW

[Series 1: dg chest 2 view · 0.14mm/px · 2 of 2 slices shown]
[im 1/2]
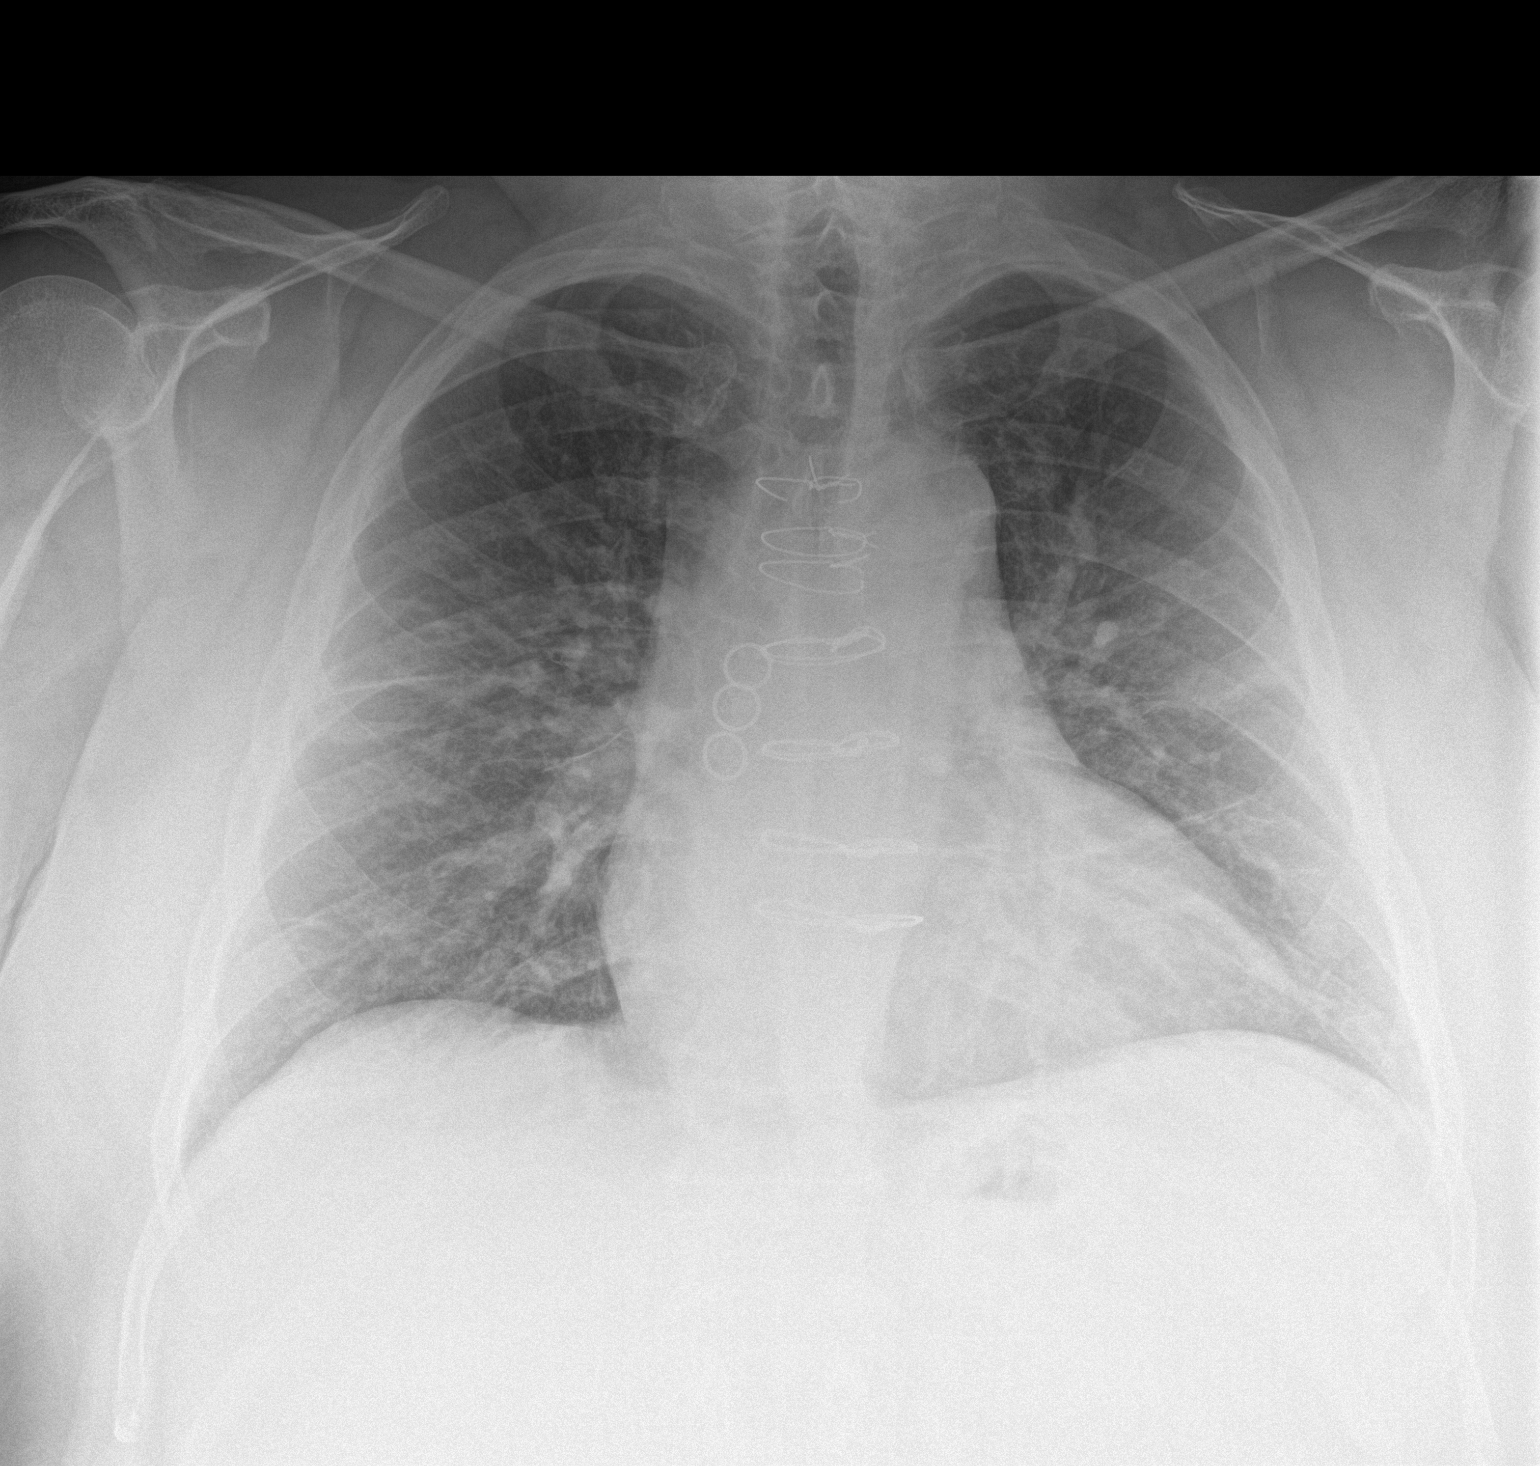
[im 2/2]
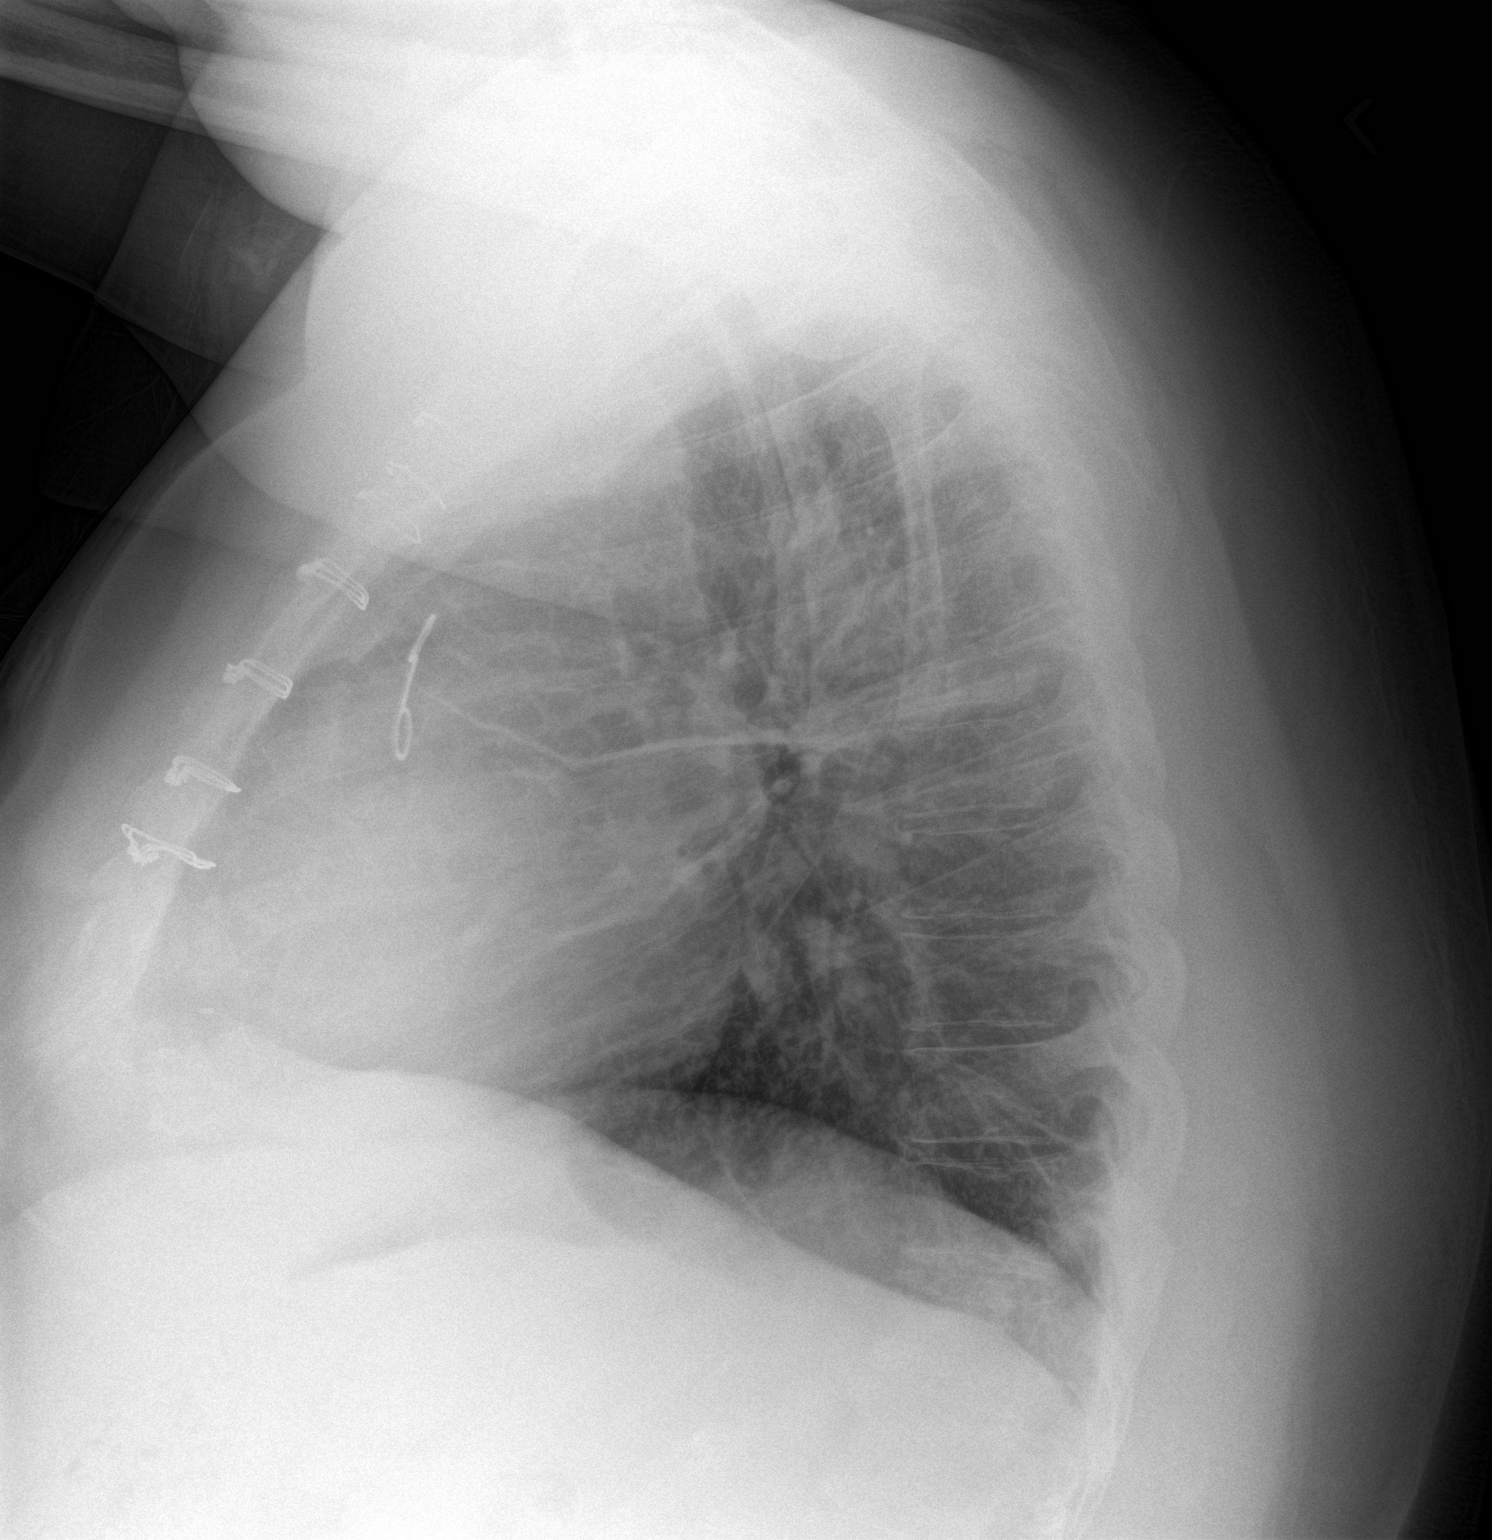

[2 of 2 positions shown; findings below may reference images not displayed]

FINDINGS: Cardiomegaly and CABG changes again noted.

Pulmonary vascular congestion is again identified with trace amount
of right pleural fluid.

Mild interstitial opacities could represent mild interstitial edema.

There is no evidence of left pleural effusion, pneumothorax, or
pulmonary mass.

No acute bony abnormalities are identified.
IMPRESSION: Cardiomegaly with pulmonary vascular congestion and possible mild
interstitial edema.

## 2016-09-11 IMAGING — CR DG CHEST 1V PORT
1 series · 1 of 1 positions shown · non-contrast
Comparison: January 14, 2015

CLINICAL DATA: History of myocardial infarction and cardiac
arrhythmias ; recent congestive heart failure.

EXAM:
PORTABLE CHEST - 1 VIEW

[portable]
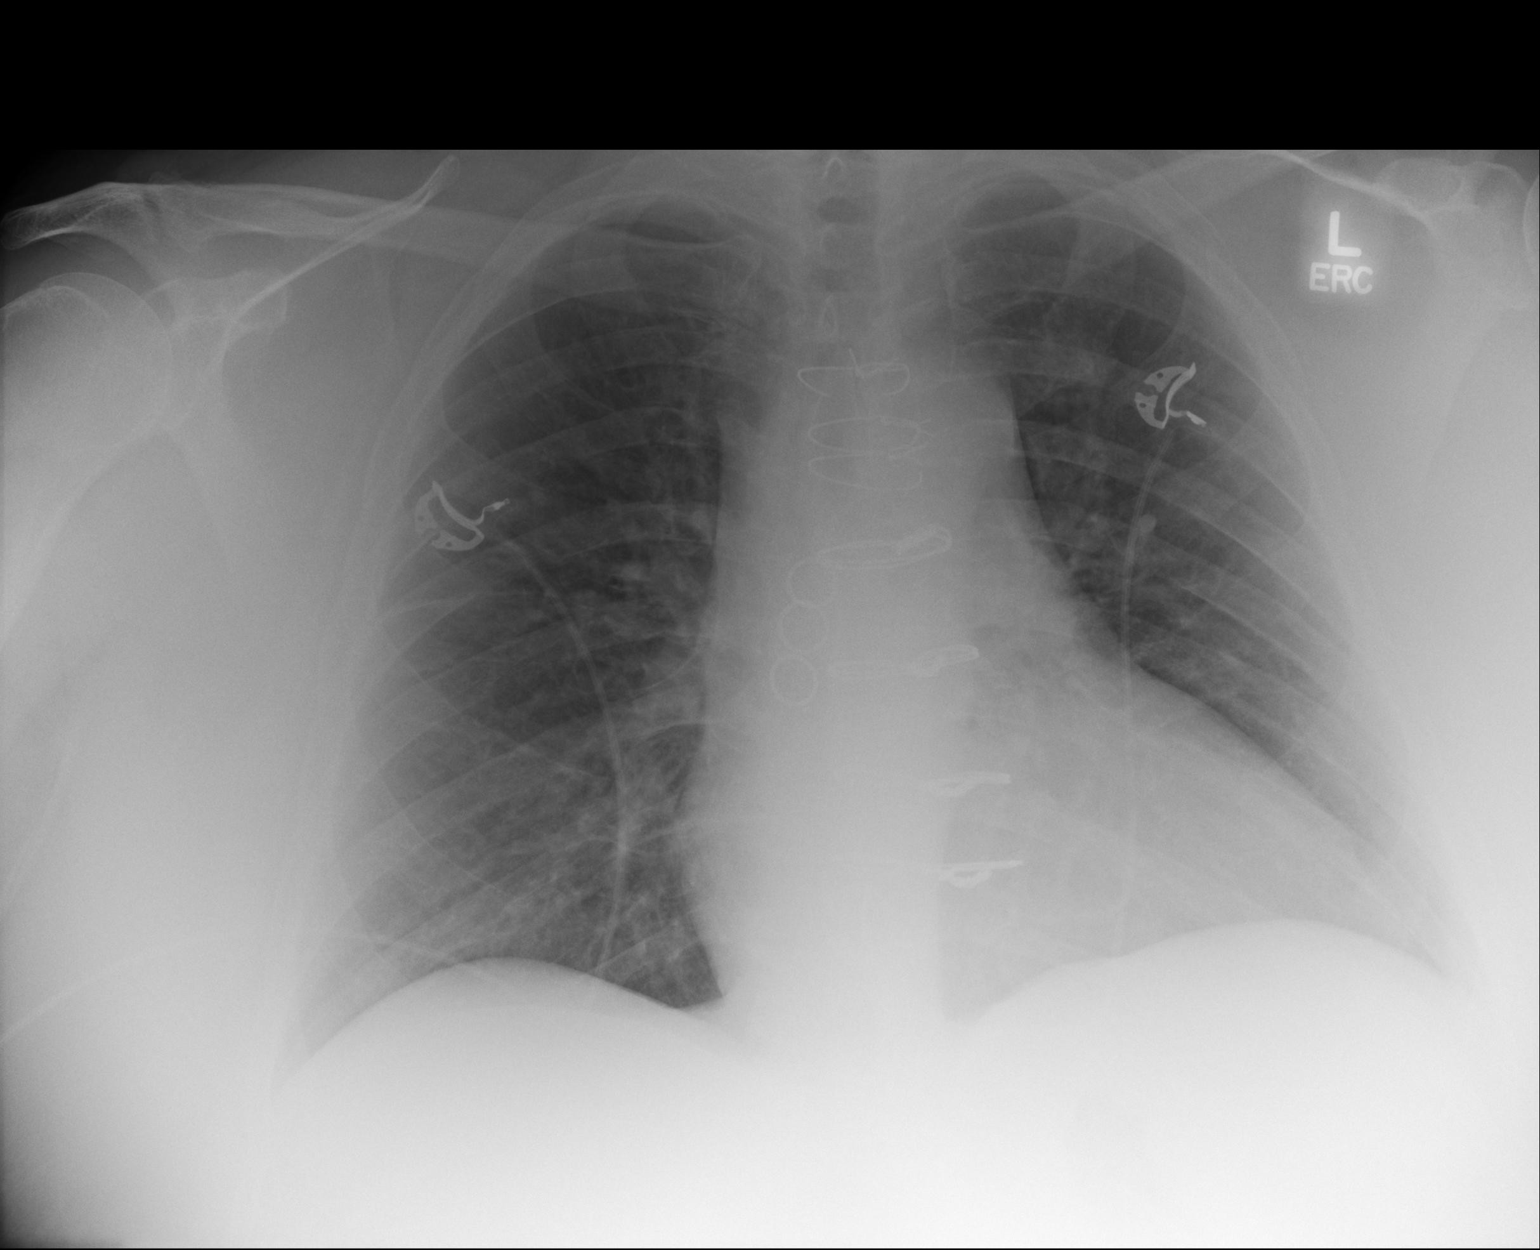

[1 of 1 positions shown; findings below may reference images not displayed]

FINDINGS: Currently there is no edema or consolidation. The heart is slightly
enlarged with a mild degree of pulmonary venous hypertension. No
adenopathy. Patient is status post coronary artery bypass grafting.
IMPRESSION: Mild cardiomegaly and pulmonary venous hypertension consistent with
a degree of pulmonary vascular congestion. There is currently no
edema or consolidation, however. No new opacity.

## 2016-09-24 DIAGNOSIS — I480 Paroxysmal atrial fibrillation: Secondary | ICD-10-CM | POA: Diagnosis not present

## 2016-09-24 DIAGNOSIS — R0789 Other chest pain: Secondary | ICD-10-CM | POA: Diagnosis not present

## 2016-09-24 DIAGNOSIS — E7800 Pure hypercholesterolemia, unspecified: Secondary | ICD-10-CM | POA: Diagnosis not present

## 2016-09-24 DIAGNOSIS — I5033 Acute on chronic diastolic (congestive) heart failure: Secondary | ICD-10-CM | POA: Diagnosis not present

## 2016-09-24 DIAGNOSIS — I10 Essential (primary) hypertension: Secondary | ICD-10-CM | POA: Diagnosis not present

## 2016-09-24 DIAGNOSIS — I350 Nonrheumatic aortic (valve) stenosis: Secondary | ICD-10-CM | POA: Diagnosis not present

## 2016-09-24 DIAGNOSIS — I25810 Atherosclerosis of coronary artery bypass graft(s) without angina pectoris: Secondary | ICD-10-CM | POA: Diagnosis not present

## 2016-09-26 DIAGNOSIS — E1159 Type 2 diabetes mellitus with other circulatory complications: Secondary | ICD-10-CM | POA: Diagnosis not present

## 2016-09-26 DIAGNOSIS — E113293 Type 2 diabetes mellitus with mild nonproliferative diabetic retinopathy without macular edema, bilateral: Secondary | ICD-10-CM | POA: Diagnosis not present

## 2016-09-26 DIAGNOSIS — Z89421 Acquired absence of other right toe(s): Secondary | ICD-10-CM | POA: Diagnosis not present

## 2016-09-26 DIAGNOSIS — Z794 Long term (current) use of insulin: Secondary | ICD-10-CM | POA: Diagnosis not present

## 2016-09-26 DIAGNOSIS — E1165 Type 2 diabetes mellitus with hyperglycemia: Secondary | ICD-10-CM | POA: Diagnosis not present

## 2016-09-26 DIAGNOSIS — E1142 Type 2 diabetes mellitus with diabetic polyneuropathy: Secondary | ICD-10-CM | POA: Diagnosis not present

## 2016-10-02 ENCOUNTER — Encounter: Payer: Self-pay | Admitting: Internal Medicine

## 2016-10-02 ENCOUNTER — Ambulatory Visit (INDEPENDENT_AMBULATORY_CARE_PROVIDER_SITE_OTHER): Payer: Medicare HMO | Admitting: Internal Medicine

## 2016-10-02 VITALS — BP 136/84 | HR 61 | Wt 283.0 lb

## 2016-10-02 DIAGNOSIS — J9611 Chronic respiratory failure with hypoxia: Secondary | ICD-10-CM

## 2016-10-02 DIAGNOSIS — J449 Chronic obstructive pulmonary disease, unspecified: Secondary | ICD-10-CM | POA: Diagnosis not present

## 2016-10-02 MED ORDER — UMECLIDINIUM-VILANTEROL 62.5-25 MCG/INH IN AEPB: 1.0000 | INHALATION_SPRAY | Freq: Every day | RESPIRATORY_TRACT | 4 refills | Status: DC

## 2016-10-02 NOTE — Patient Instructions (Signed)
Check PFT and 6MWT Start Anoro

## 2016-10-02 NOTE — Progress Notes (Signed)
[facility] Consultation      Date: 10/02/2016,   MRN# [id number] Marcus Williams 1955-09-06 Code Status:  Hosp day:@LENGTHOFSTAYDAYS @ Referring MD: @ATDPROV @     PCP:      AdmissionWeight: 283 lb (128.4 kg)                 CurrentWeight: 283 lb (128.4 kg) Marcus Williams is a 61 y.o. old male seen in consultation for chronic SOB      CHIEF COMPLAINT:   Follow  Up SOB, DOE    HISTORY OF PRESENT ILLNESS  Patient following after 1.5 years Has chronic SOB and DOE States CPAP is doing well No signs of infection at this time   The patient has an extensive medical history and has been followed closely by Dr Doy Hutching for general medical care and Dr Ubaldo Glassing for cardiac problems. He underwent multivessel CABG at age 51. He's had long-standing obesity. The patient worked until about 4 years ago. He is disabled from cardiac and medical problems. He has had long-standing obstructive sleep apnea and has been on CPAP for 10 years. He has paroxysmal atrial fibrillation and has been maintained on long-term anticoagulation. He denies any history of bleeding problems. He has long-standing diabetes and has been insulin-dependent for approximately 10 years.   He has been on oxygen but somehow was d/c'd   Current Medication:  Current Outpatient Prescriptions:  .  albuterol (PROVENTIL HFA;VENTOLIN HFA) 108 (90 Base) MCG/ACT inhaler, Inhale 2 puffs into the lungs every 6 (six) hours as needed for wheezing or shortness of breath., Disp: 1 Inhaler, Rfl: 10 .  aspirin 81 MG EC tablet, Take 1 tablet (81 mg total) by mouth daily., Disp: , Rfl:  .  diltiazem (CARDIZEM CD) 300 MG 24 hr capsule, Take 1 capsule (300 mg total) by mouth daily., Disp: 30 capsule, Rfl: 5 .  docusate sodium (COLACE) 100 MG capsule, Take 100 mg by mouth 2 (two) times daily., Disp: , Rfl:  .  ferrous sulfate 325 (65 FE) MG tablet, Take 1 tablet (325 mg total) by mouth daily with breakfast., Disp: 90 tablet, Rfl: 2 .   gabapentin (NEURONTIN) 300 MG capsule, Take 300 mg by mouth 2 (two) times daily., Disp: , Rfl:  .  insulin NPH Human (HUMULIN N,NOVOLIN N) 100 UNIT/ML injection, Inject 60 Units into the skin 2 (two) times daily. , Disp: , Rfl:  .  lisinopril (PRINIVIL,ZESTRIL) 20 MG tablet, Take 20 mg by mouth daily., Disp: , Rfl:  .  meloxicam (MOBIC) 15 MG tablet, Take 15 mg by mouth daily., Disp: , Rfl:  .  metFORMIN (GLUCOPHAGE) 1000 MG tablet, Take 1,000 mg by mouth 2 (two) times daily with a meal., Disp: , Rfl:  .  metoprolol (LOPRESSOR) 100 MG tablet, Take 100 mg by mouth 2 (two) times daily., Disp: , Rfl:  .  pantoprazole (PROTONIX) 40 MG tablet, Take 40 mg by mouth daily., Disp: , Rfl:  .  potassium chloride 20 MEQ TBCR, Take 20 mEq by mouth daily., Disp: 34 tablet, Rfl: 6 .  simvastatin (ZOCOR) 40 MG tablet, Take 40 mg by mouth at bedtime. , Disp: , Rfl:  .  torsemide (DEMADEX) 20 MG tablet, TAKE 3 TABLETS (60 MG TOTAL) BY MOUTH DAILY., Disp: 180 tablet, Rfl: 2 .  traZODone (DESYREL) 100 MG tablet, Take 100 mg by mouth at bedtime., Disp: , Rfl:  .  nitroGLYCERIN (NITROSTAT) 0.4 MG SL tablet, Place 0.4 mg under the tongue every 5 (  five) minutes as needed for chest pain., Disp: , Rfl:     ALLERGIES   Patient has no known allergies.     REVIEW OF SYSTEMS   Review of Systems  Constitutional: Negative for chills, diaphoresis, fever, malaise/fatigue and weight loss.  Respiratory: Positive for shortness of breath and wheezing. Negative for cough, hemoptysis and sputum production.   Cardiovascular: Negative for chest pain and leg swelling.  Gastrointestinal: Negative.   All other systems reviewed and are negative.    VS: BP 136/84 (BP Location: Left Arm, Cuff Size: Normal)   Pulse 61   Wt 283 lb (128.4 kg)   SpO2 97%   BMI 43.03 kg/m      PHYSICAL EXAM  Physical Exam  Constitutional: He is oriented to person, place, and time. He appears well-developed and well-nourished. No distress.    HENT:  Head: Normocephalic and atraumatic.  Mouth/Throat: No oropharyngeal exudate.  Cardiovascular: Normal rate and regular rhythm.   Murmur heard. Pulmonary/Chest: No respiratory distress. He has no wheezes.  Musculoskeletal: Normal range of motion. He exhibits no edema.  Neurological: He is alert and oriented to person, place, and time.  Skin: He is not diaphoretic.     ASSESSMENT/PLAN   61 yo obese white male with chronic SOB and DOE which is related to multiple problems including CAD,OSA,mild COPD and his obesity.  I have explained again that patient will have chronic SOB and DOE due to his underlying medical conditions He will need full set of PFT's to assess COPD, as of now he is Gold Stage B/C  1.will need to re-assess oxygen needs 2.albuterol as needed for COPD 3.continue CPAP for OSA-will need to see compliance report as next visit 4. I will start AC/LABA therapy  therapy but will need to assess insurance recs. Will try Anoro  Will set up patient assistance program appointment with medication management 5.will need to check PFT's and 6MWT  Follow up in 3 months Patient satisfied with Plan of action and management. All questions answered  [name], M.D.  [facility]  Medical Director [place] Director [facility] Cardio-Pulmonary Department

## 2016-10-03 ENCOUNTER — Institutional Professional Consult (permissible substitution): Payer: Commercial Managed Care - HMO | Admitting: Internal Medicine

## 2016-10-08 ENCOUNTER — Ambulatory Visit (INDEPENDENT_AMBULATORY_CARE_PROVIDER_SITE_OTHER): Payer: Medicare HMO | Admitting: *Deleted

## 2016-10-08 DIAGNOSIS — J410 Simple chronic bronchitis: Secondary | ICD-10-CM | POA: Diagnosis not present

## 2016-10-08 NOTE — Progress Notes (Signed)
SMW performed today.  SIX MIN WALK 10/08/2016 04/16/2015  Medications Cardizem, Metformin, Metoprolol, Protonix, Potassium, Torsemide Cardizem, Lasix, Amaryl, Novolog, Metformin, Metoprolol, Protonix, Lyrica,Xarelto  Supplimental Oxygen during Test? (L/min) No No  Laps 6 5  Partial Lap (in Meters) 6 0  Baseline BP (sitting) 128/70 150/84  Baseline Heartrate 45 65  Baseline Dyspnea (Borg Scale) 3 5  Baseline Fatigue (Borg Scale) 7 5  Baseline SPO2 97 97  BP (sitting) 144/82 162/92  Heartrate 55 80  Dyspnea (Borg Scale) 3 7  Fatigue (Borg Scale) 7 6  SPO2 98 97  BP (sitting) 136/80 158/86  Heartrate 51 65  SPO2 97 97  Stopped or Paused before Six Minutes No No  Distance Completed 294 240  Tech Comments: Pt walked at moderate pace. Pt stated he got CP but it away moments after sitting.

## 2016-11-03 DIAGNOSIS — J029 Acute pharyngitis, unspecified: Secondary | ICD-10-CM | POA: Diagnosis not present

## 2016-11-03 DIAGNOSIS — R05 Cough: Secondary | ICD-10-CM | POA: Diagnosis not present

## 2016-11-03 DIAGNOSIS — Z794 Long term (current) use of insulin: Secondary | ICD-10-CM | POA: Diagnosis not present

## 2016-11-03 DIAGNOSIS — Z125 Encounter for screening for malignant neoplasm of prostate: Secondary | ICD-10-CM | POA: Diagnosis not present

## 2016-11-03 DIAGNOSIS — E1143 Type 2 diabetes mellitus with diabetic autonomic (poly)neuropathy: Secondary | ICD-10-CM | POA: Diagnosis not present

## 2016-11-03 DIAGNOSIS — E7800 Pure hypercholesterolemia, unspecified: Secondary | ICD-10-CM | POA: Diagnosis not present

## 2016-11-03 DIAGNOSIS — I10 Essential (primary) hypertension: Secondary | ICD-10-CM | POA: Diagnosis not present

## 2016-11-03 DIAGNOSIS — L851 Acquired keratosis [keratoderma] palmaris et plantaris: Secondary | ICD-10-CM | POA: Diagnosis not present

## 2016-11-03 DIAGNOSIS — E1140 Type 2 diabetes mellitus with diabetic neuropathy, unspecified: Secondary | ICD-10-CM | POA: Diagnosis not present

## 2016-11-03 DIAGNOSIS — L97511 Non-pressure chronic ulcer of other part of right foot limited to breakdown of skin: Secondary | ICD-10-CM | POA: Diagnosis not present

## 2016-11-03 DIAGNOSIS — B351 Tinea unguium: Secondary | ICD-10-CM | POA: Diagnosis not present

## 2016-11-03 DIAGNOSIS — Z79899 Other long term (current) drug therapy: Secondary | ICD-10-CM | POA: Diagnosis not present

## 2016-11-07 DIAGNOSIS — Z89421 Acquired absence of other right toe(s): Secondary | ICD-10-CM | POA: Diagnosis not present

## 2016-11-07 DIAGNOSIS — E1165 Type 2 diabetes mellitus with hyperglycemia: Secondary | ICD-10-CM | POA: Diagnosis not present

## 2016-11-07 DIAGNOSIS — Z794 Long term (current) use of insulin: Secondary | ICD-10-CM | POA: Diagnosis not present

## 2016-11-07 DIAGNOSIS — E1159 Type 2 diabetes mellitus with other circulatory complications: Secondary | ICD-10-CM | POA: Diagnosis not present

## 2016-11-07 DIAGNOSIS — L97411 Non-pressure chronic ulcer of right heel and midfoot limited to breakdown of skin: Secondary | ICD-10-CM | POA: Diagnosis not present

## 2016-11-07 DIAGNOSIS — E113293 Type 2 diabetes mellitus with mild nonproliferative diabetic retinopathy without macular edema, bilateral: Secondary | ICD-10-CM | POA: Diagnosis not present

## 2016-11-07 DIAGNOSIS — E11621 Type 2 diabetes mellitus with foot ulcer: Secondary | ICD-10-CM | POA: Diagnosis not present

## 2016-11-07 DIAGNOSIS — E1142 Type 2 diabetes mellitus with diabetic polyneuropathy: Secondary | ICD-10-CM | POA: Diagnosis not present

## 2016-11-19 DIAGNOSIS — L97511 Non-pressure chronic ulcer of other part of right foot limited to breakdown of skin: Secondary | ICD-10-CM | POA: Diagnosis not present

## 2016-11-20 IMAGING — CR DG CHEST 2V
2 series · 2 of 2 positions shown · non-contrast
Comparison: 01/15/2015

CLINICAL DATA: Cough and wheezing for 3 days. Shortness of breath
since 1 a.m. today. Pain below the left rib area.

EXAM:
CHEST  2 VIEW

[chest pa]
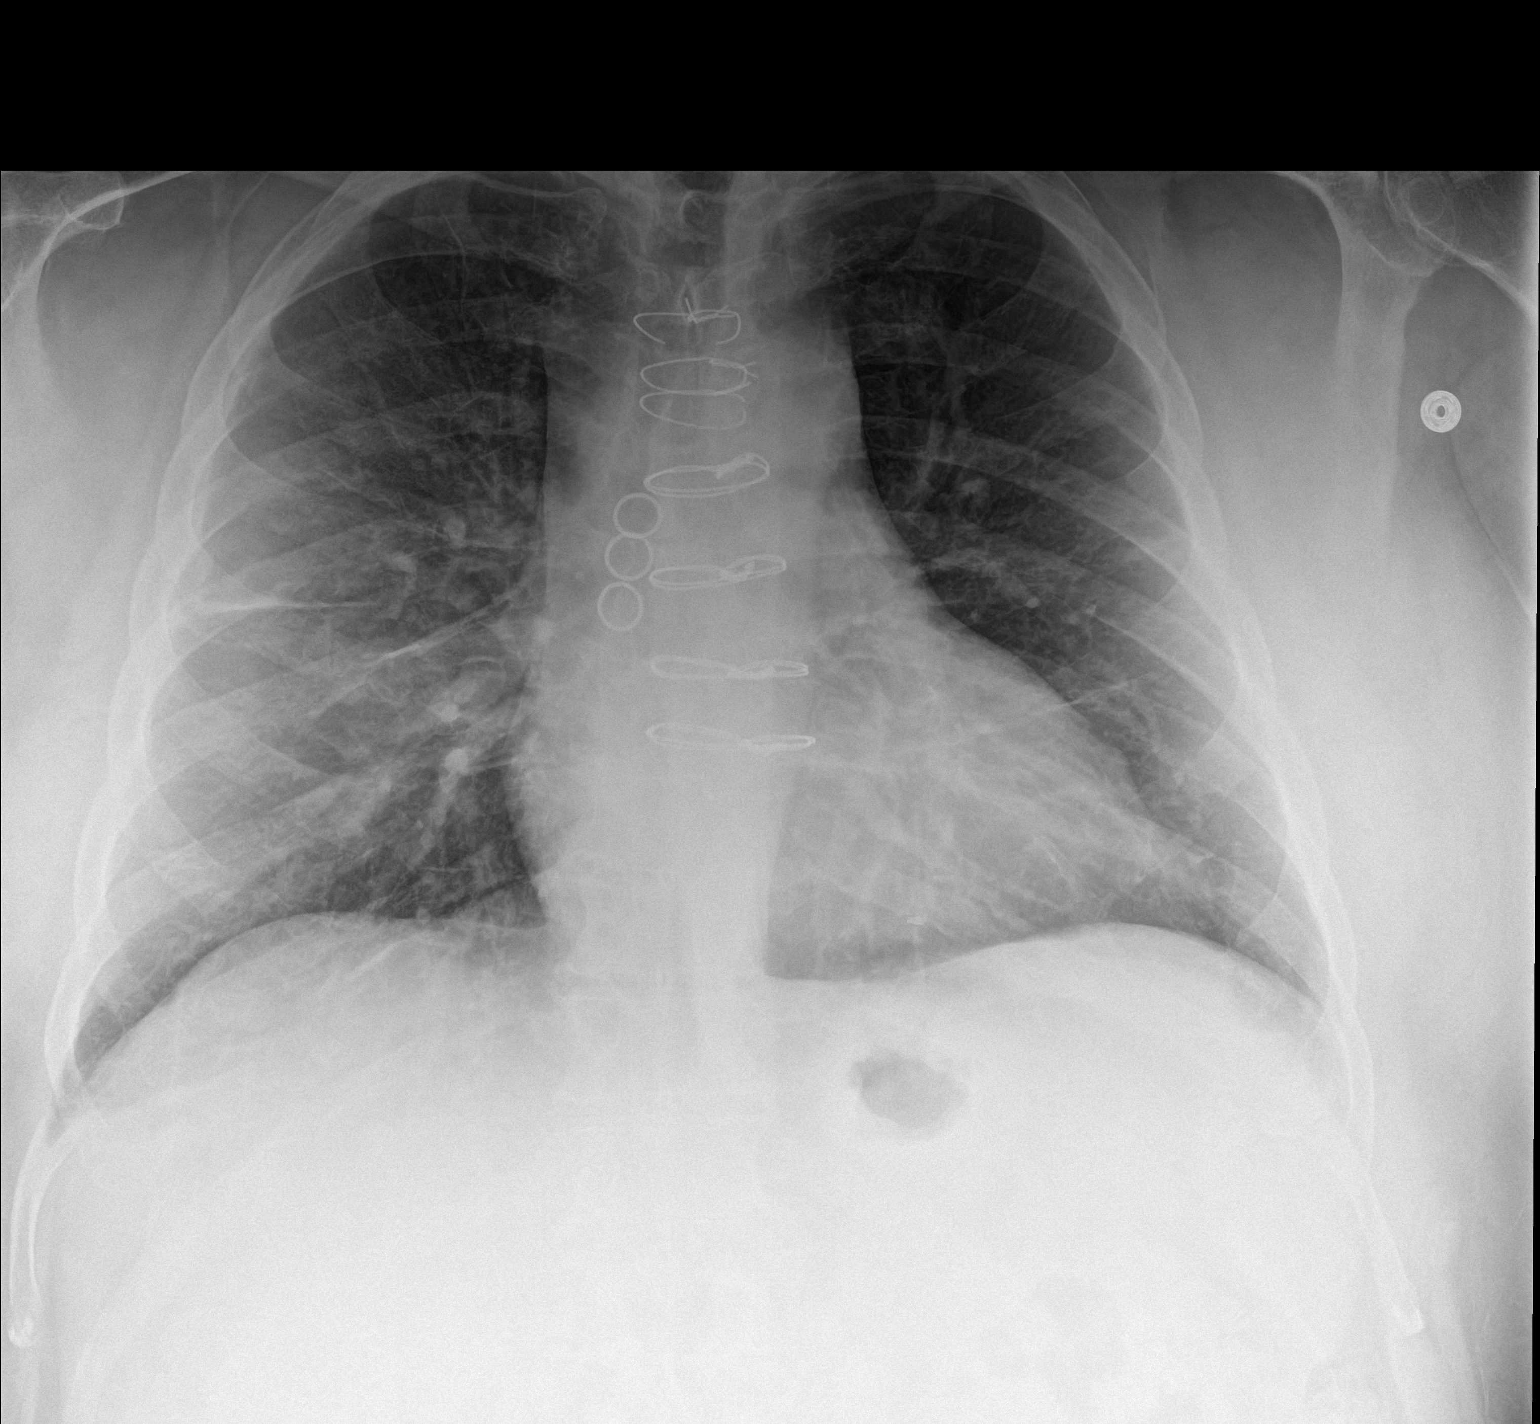

[chest lat]
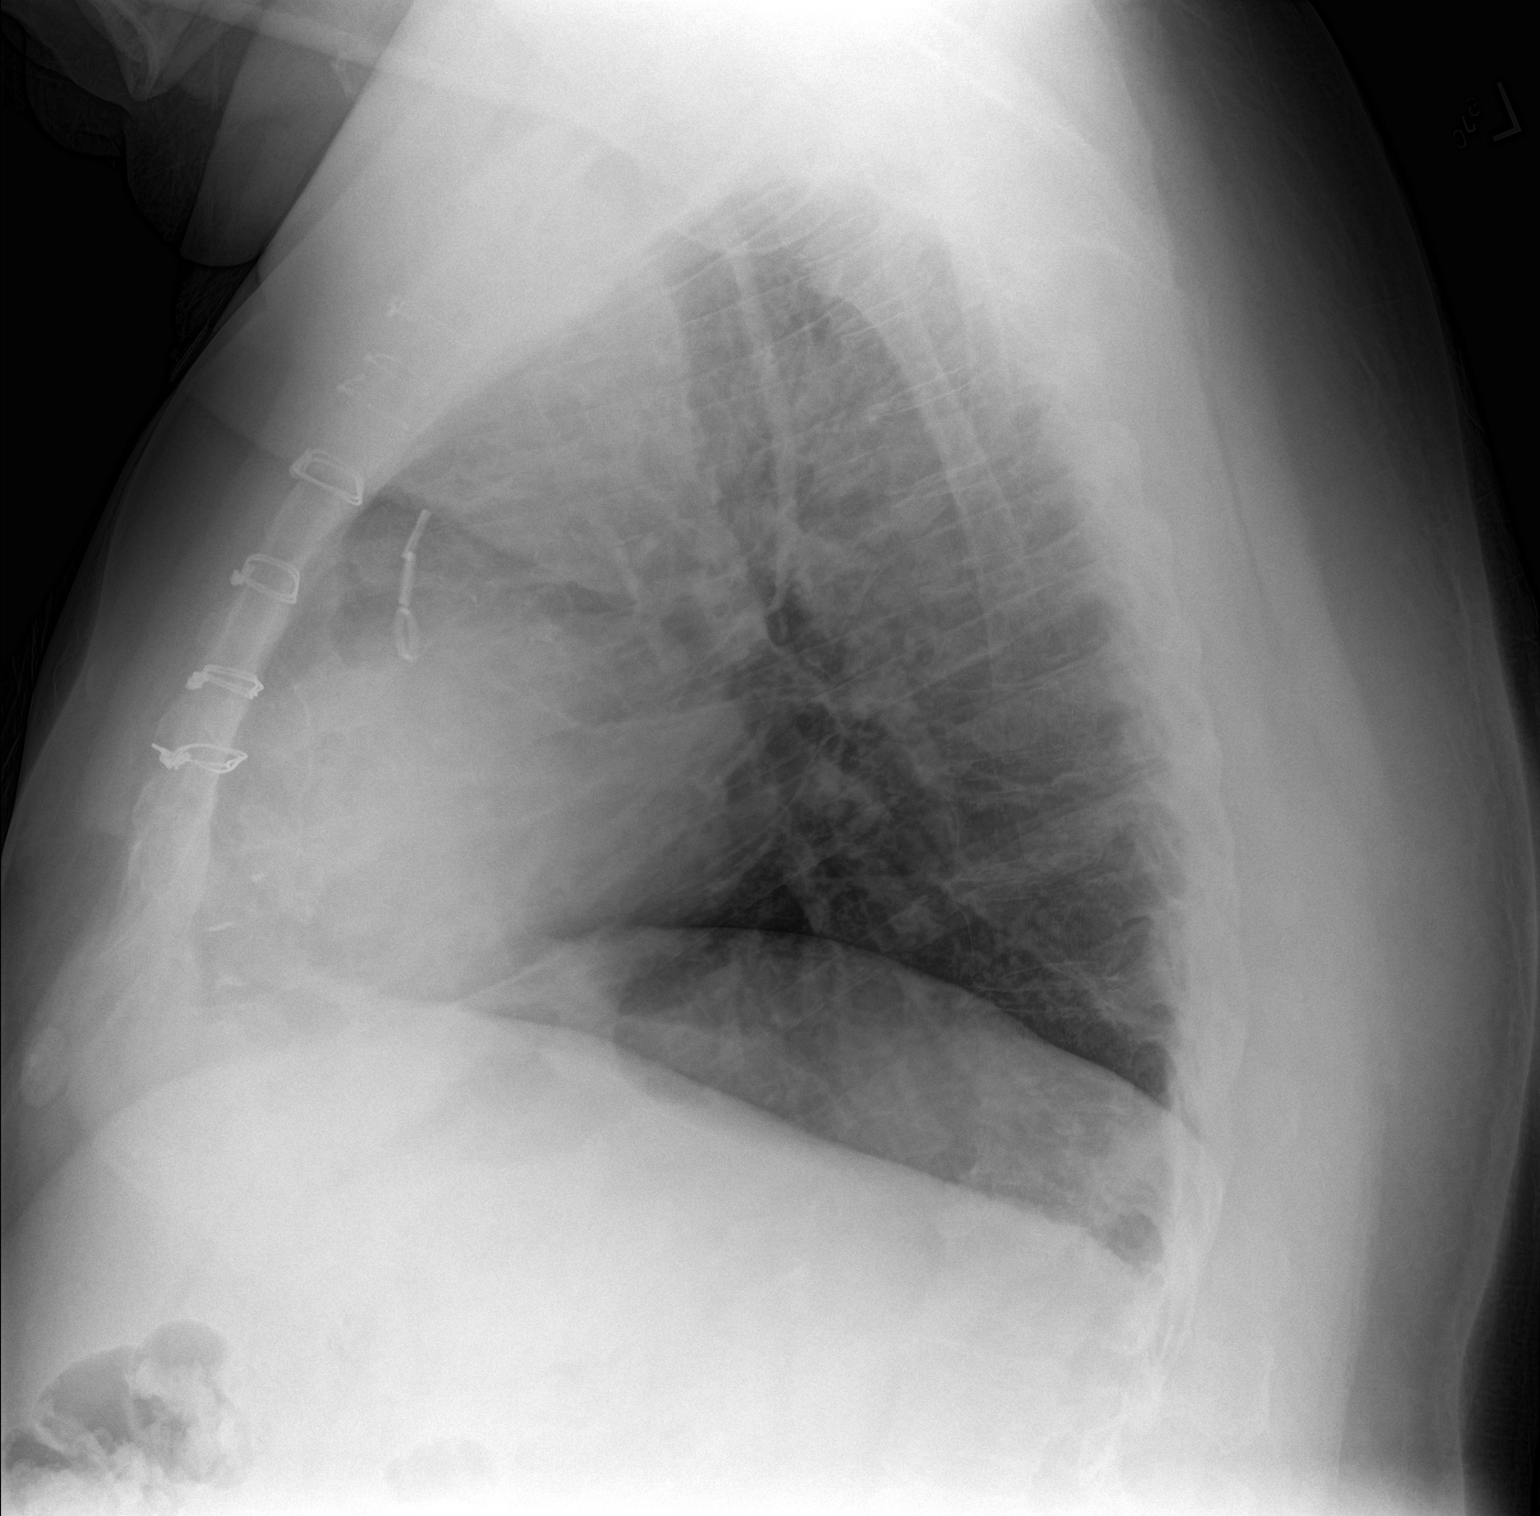

[2 of 2 positions shown; findings below may reference images not displayed]

FINDINGS: Postoperative changes in the mediastinum. Normal heart size and
pulmonary vascularity. Linear shadow is in the mid lungs probably
representing atelectasis. No focal airspace disease. No blunting of
costophrenic angles. No pneumothorax. Mediastinal contours appear
intact.
IMPRESSION: Linear atelectasis in the mid lungs.

## 2016-11-25 DIAGNOSIS — M545 Low back pain: Secondary | ICD-10-CM | POA: Diagnosis not present

## 2016-12-03 DIAGNOSIS — L97511 Non-pressure chronic ulcer of other part of right foot limited to breakdown of skin: Secondary | ICD-10-CM | POA: Diagnosis not present

## 2016-12-05 IMAGING — CR DG CHEST 2V
1 series · 2 of 2 positions shown · non-contrast
Comparison: Chest x-rays dated 03/26/2015 and 12/22/2014.

CLINICAL DATA: Chest pain with shortness of breath and numbness of
fingers for 2 days, progressively worsening.

EXAM:
CHEST  2 VIEW

[Series 1: w chest pa · 0.14mm/px · 2 of 2 slices shown]
[im 1/2]
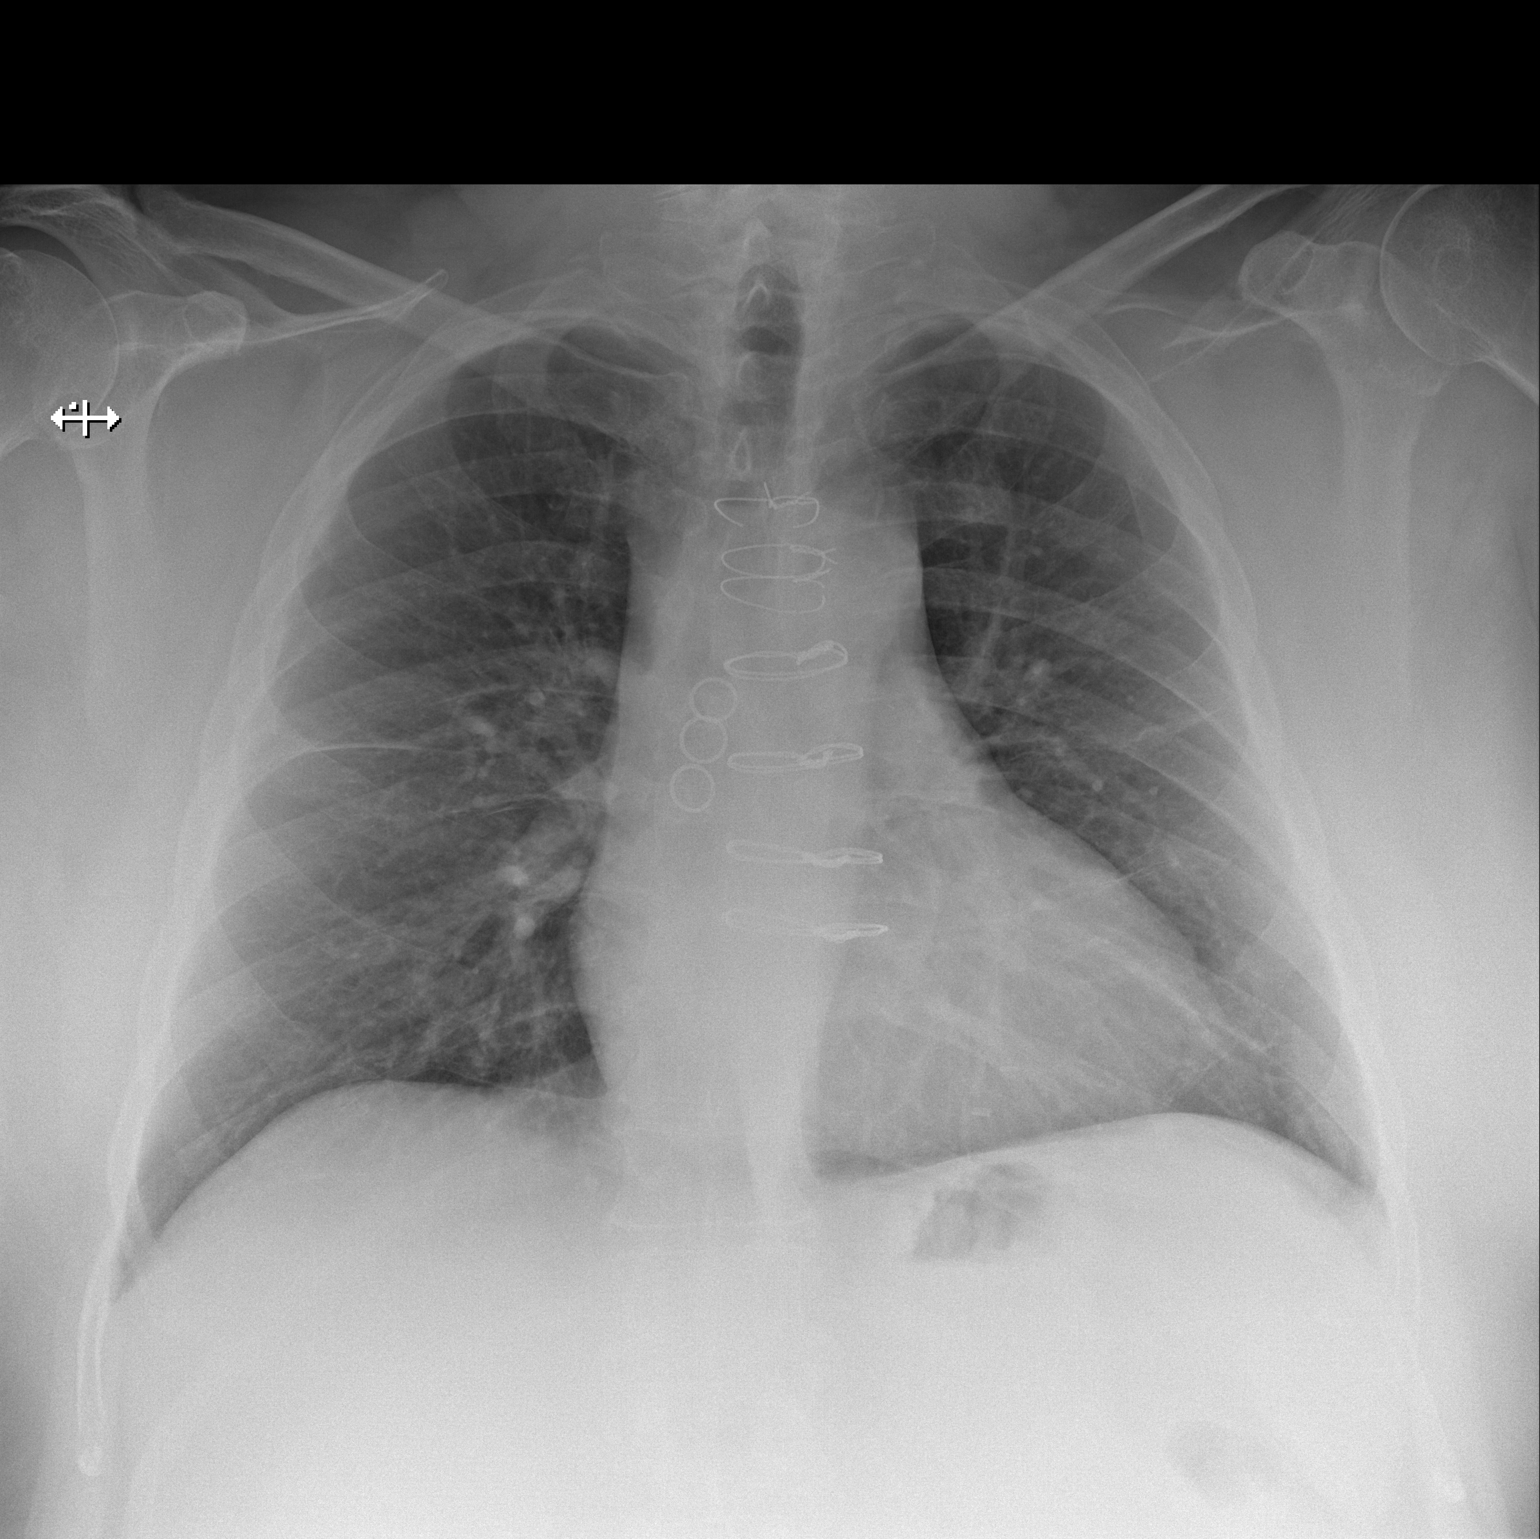
[im 2/2]
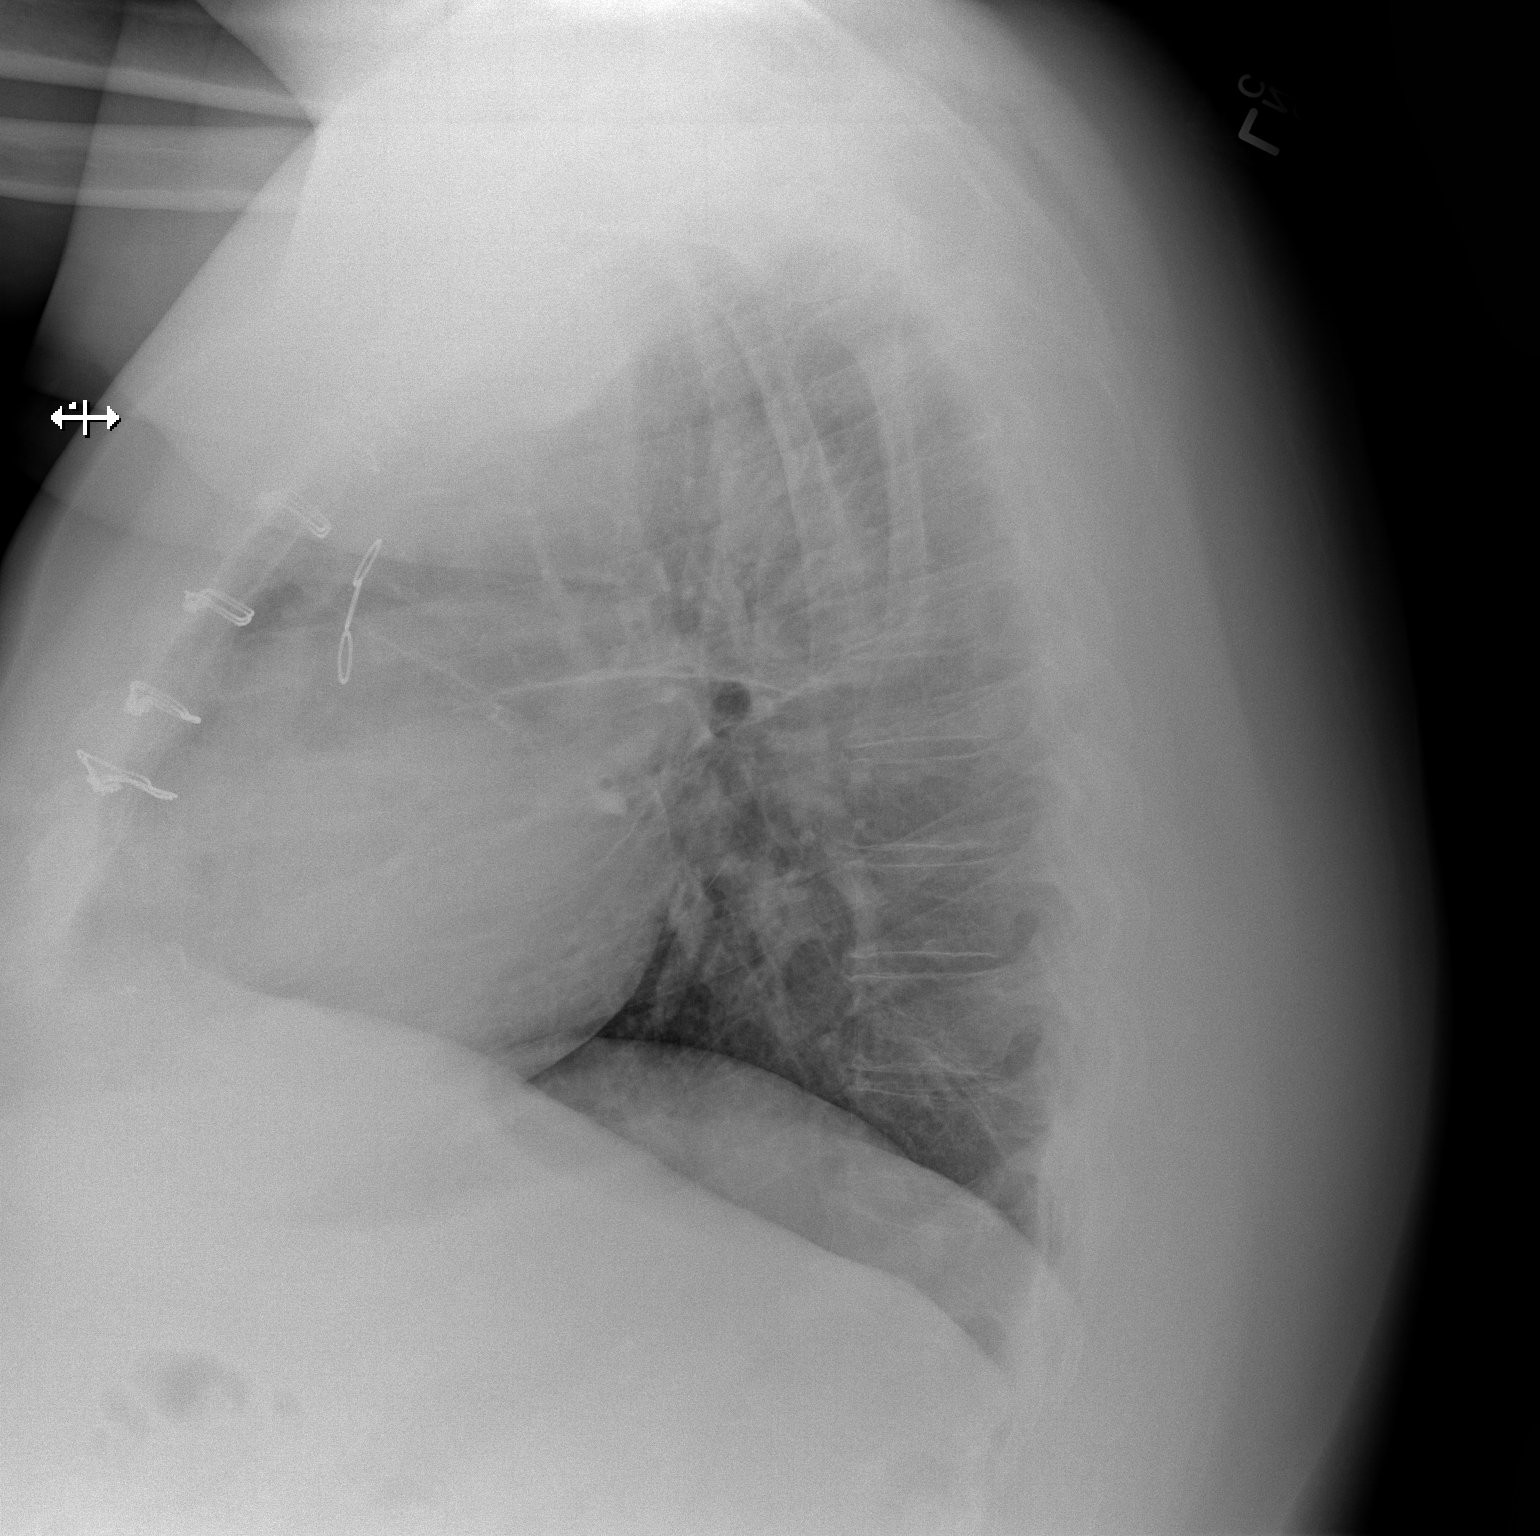

[2 of 2 positions shown; findings below may reference images not displayed]

FINDINGS: Heart size is upper normal, unchanged. Overall cardiomediastinal
silhouette is stable in size and configuration. Median sternotomy
wires are stable in alignment.

Pulmonary vascularity is within normal limits, upper normal. Lungs
are clear. No pneumonia. No pleural effusion. No pneumothorax. No
acute osseous abnormality.
IMPRESSION: Stable chest x-ray. No evidence of acute cardiopulmonary
abnormality.

## 2016-12-29 IMAGING — CR DG TOE GREAT 2+V*R*
1 series · 3 of 3 positions shown · non-contrast
Comparison: 03/03/2008

CLINICAL DATA: Diabetic wound, great toe.  Pain, swelling, redness.

EXAM:
RIGHT GREAT TOE

[Series 1: ap · 0.17mm/px · 3 of 3 slices shown]
[im 1/3]
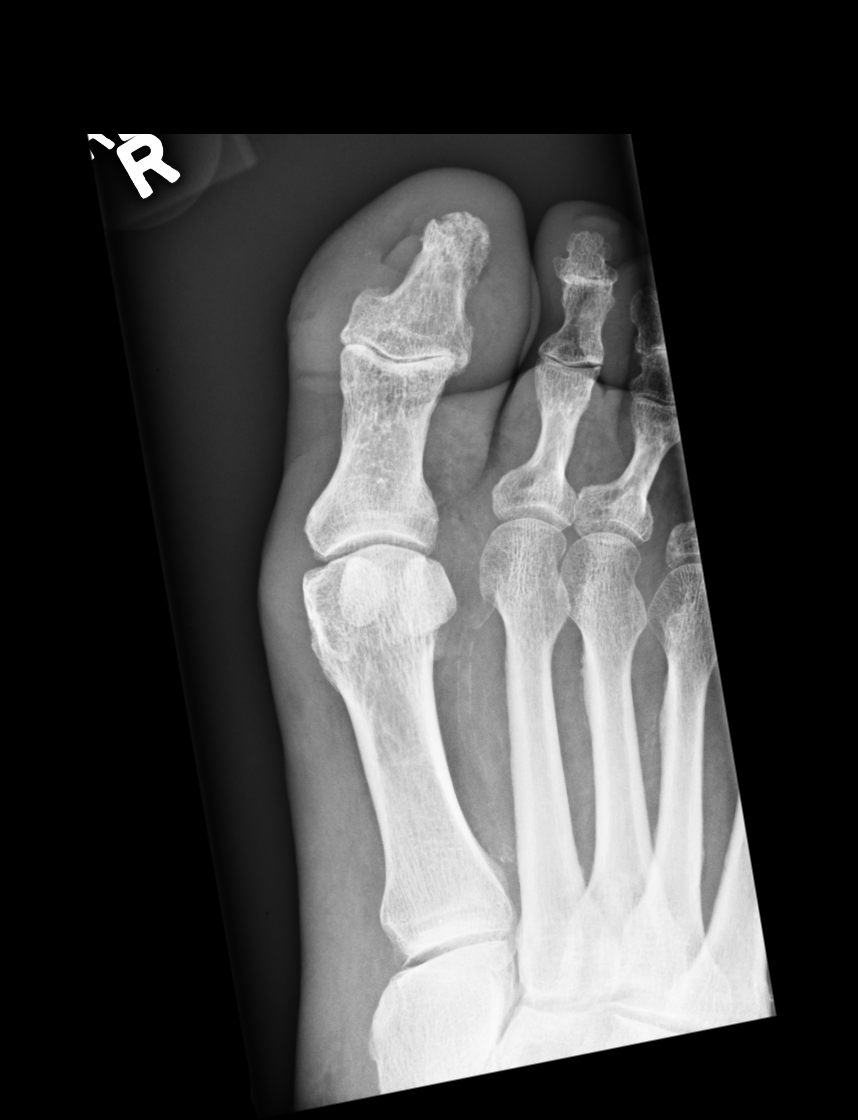
[im 2/3]
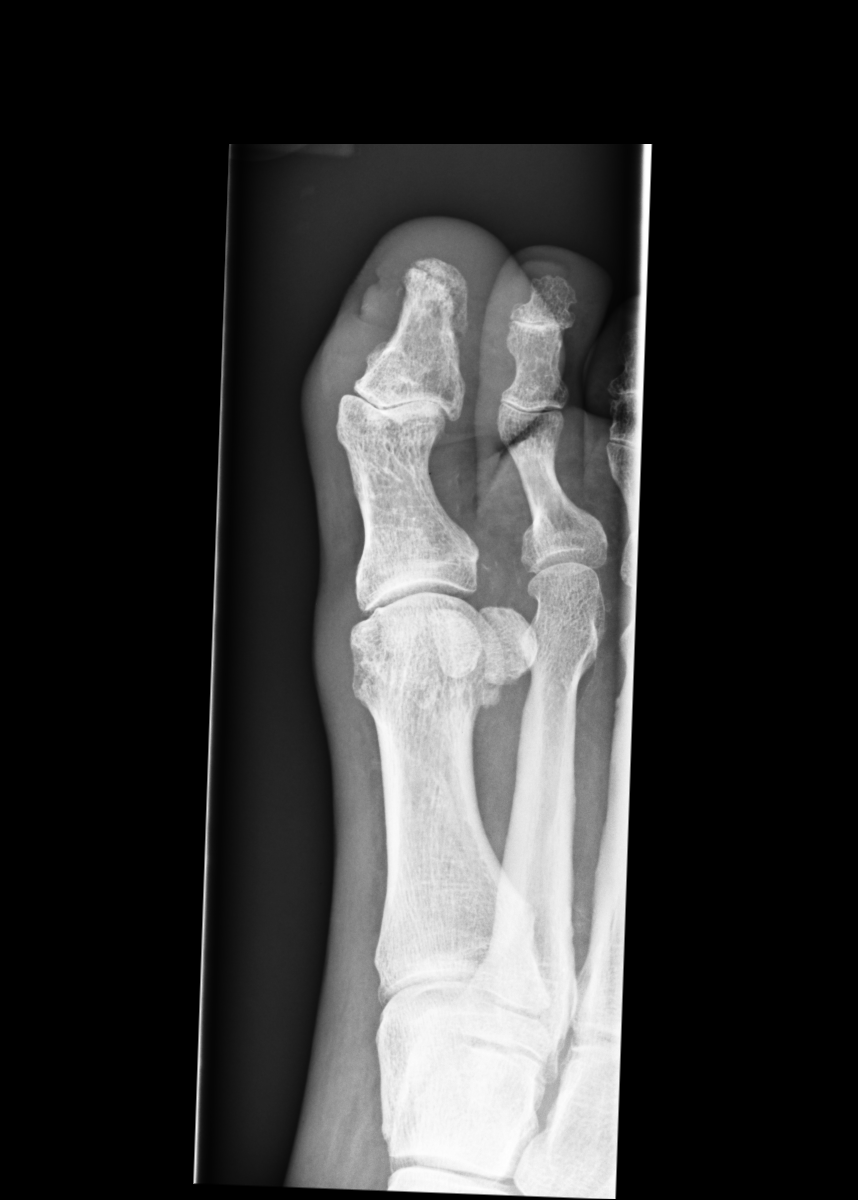
[im 3/3]
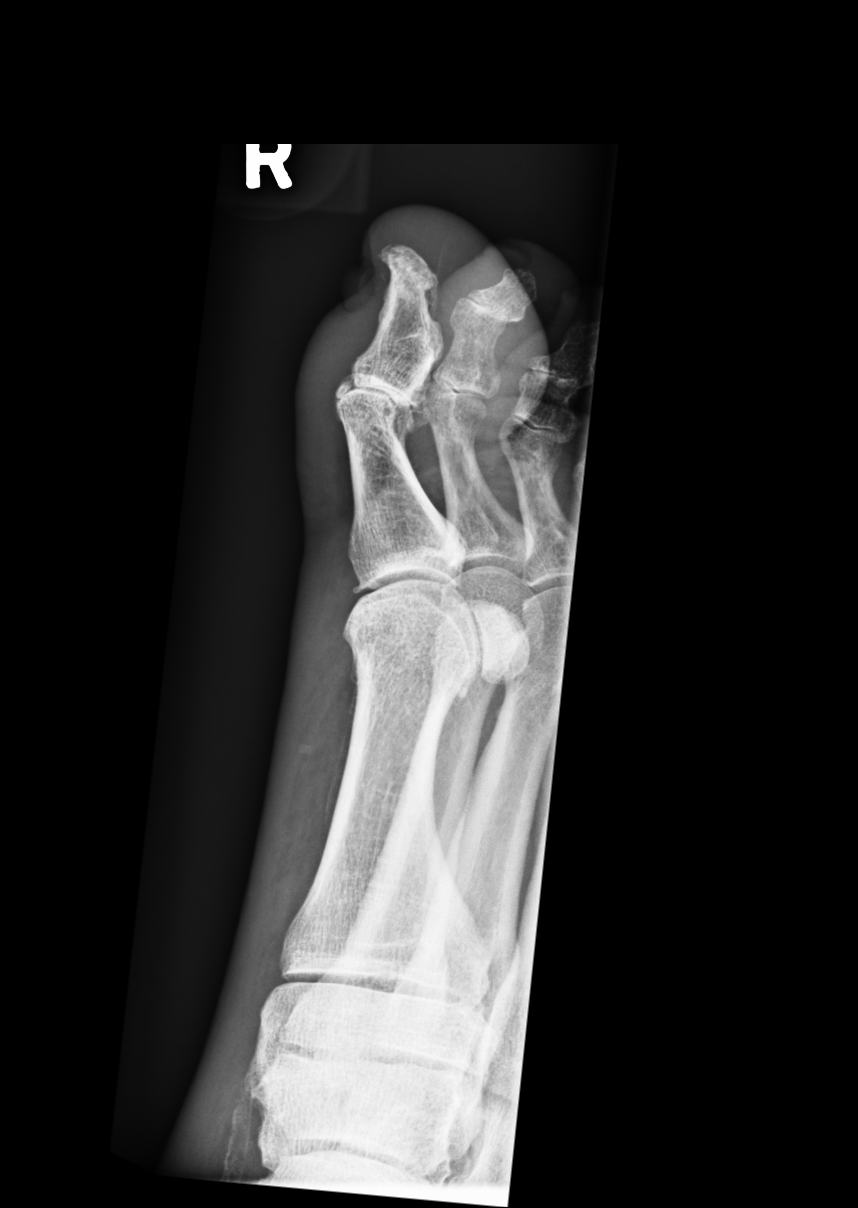

[3 of 3 positions shown; findings below may reference images not displayed]

FINDINGS: Fracture across the tuft of the distal phalanx, distracted 1 mm.
Narrowing of the articular cartilage in the interphalangeal joint of
the great toe. Small marginal spurs at the base of the proximal
phalanx and from the first metatarsal head. Vascular calcifications
at the metatarsal level. No subcutaneous gas or radiodense foreign
body is identified.
IMPRESSION: 1. Minimally displaced fracture, tuft distal phalanx right great
toe.

## 2016-12-29 IMAGING — CT CT L SPINE W/O CM
3 of 9 series · 14 of 33 positions shown, 17 images · non-contrast
Comparison: None.

CLINICAL DATA: Right leg pain

EXAM:
CT LUMBAR SPINE WITHOUT CONTRAST
TECHNIQUE: Multidetector CT imaging of the lumbar spine was performed without
intravenous contrast administration. Multiplanar CT image
reconstructions were also generated.

[Series 4: l spine soft · axial · 0.38mm/px · z∈[-570,-400]mm · 6 of 121 slices shown, 8 images]
[im 18/121  soft-tissue]
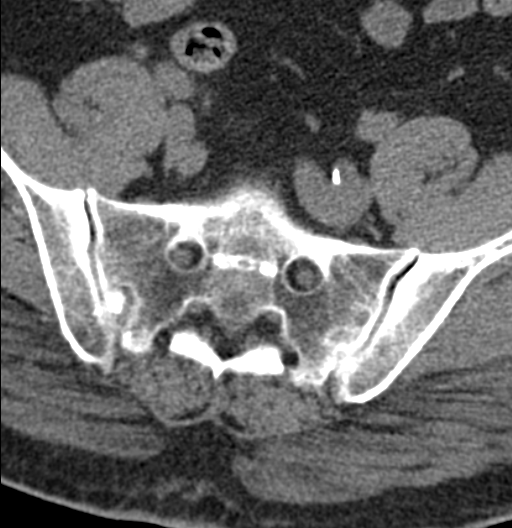
[im 18/121  bone]
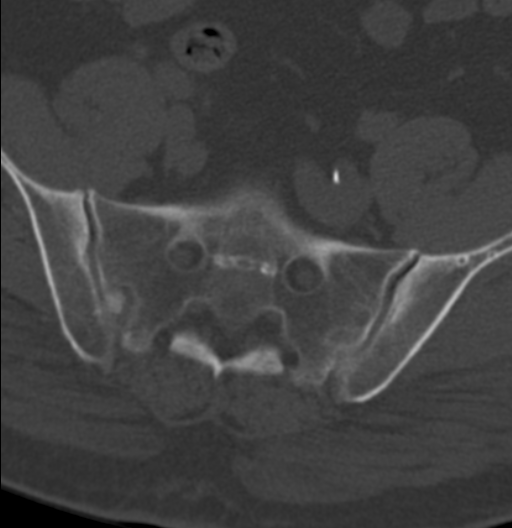
[im 35/121  bone]
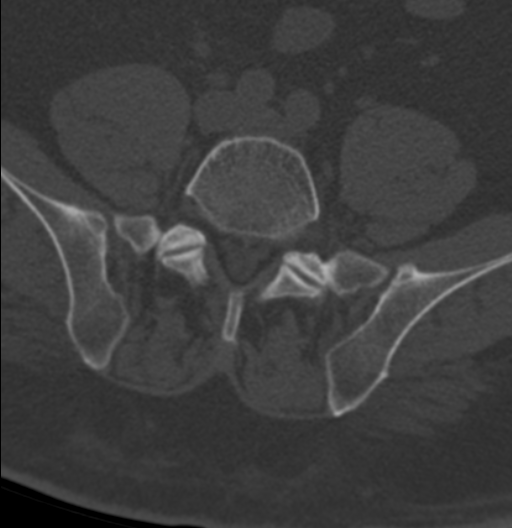
[im 52/121  bone]
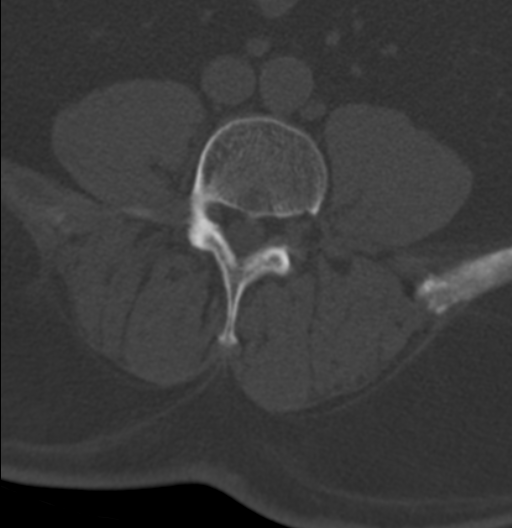
[im 69/121  bone]
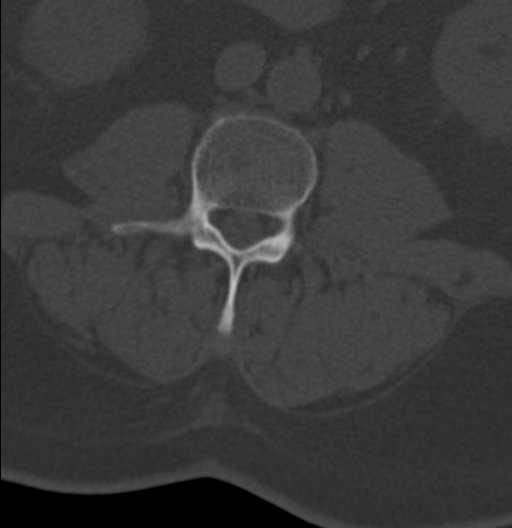
[im 86/121  soft-tissue]
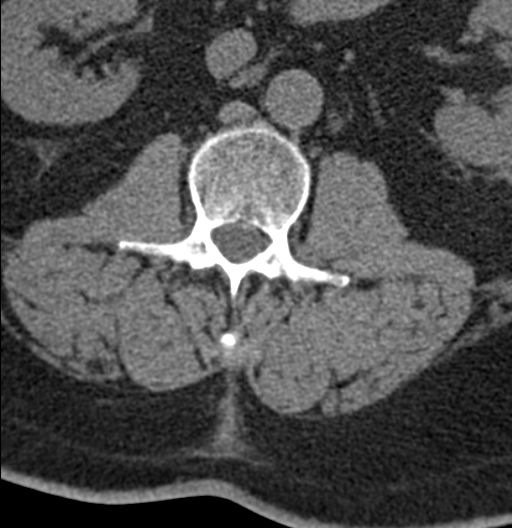
[im 86/121  bone]
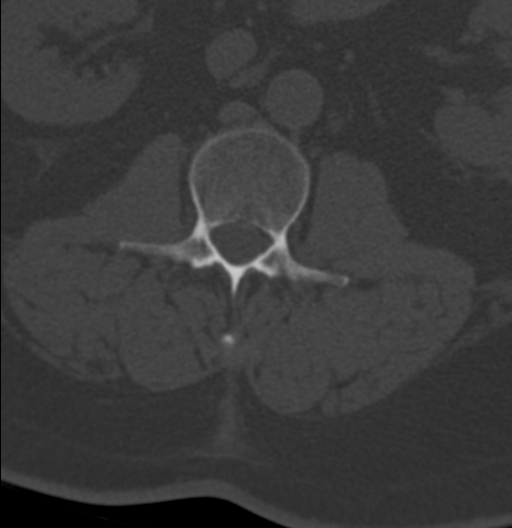
[im 103/121  bone]
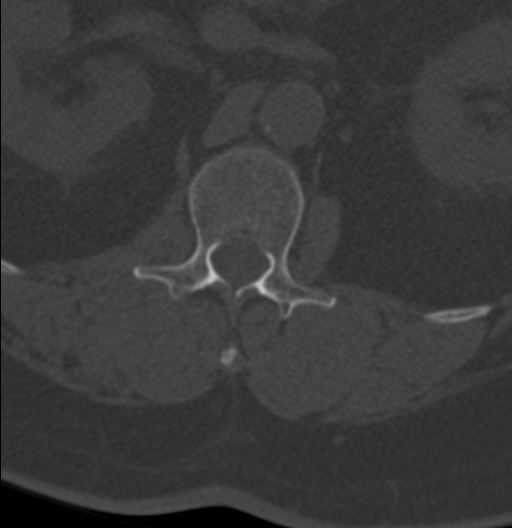

[Series 5: sagittal bone · sagittal · 0.40mm/px · 5 of 80 slices shown, 6 images]
[im 27/80  bone]
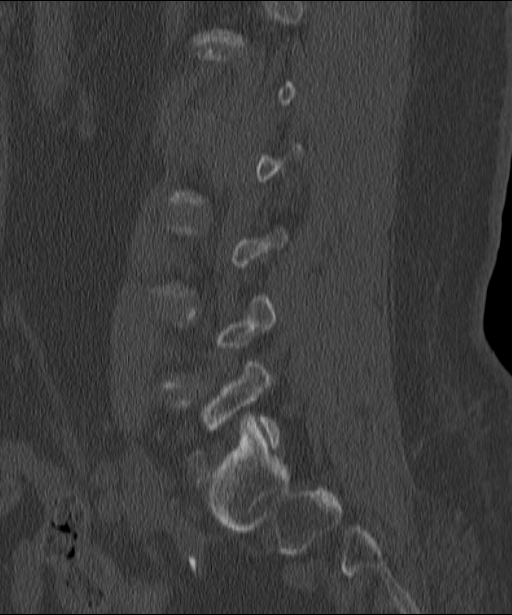
[im 33/80  bone]
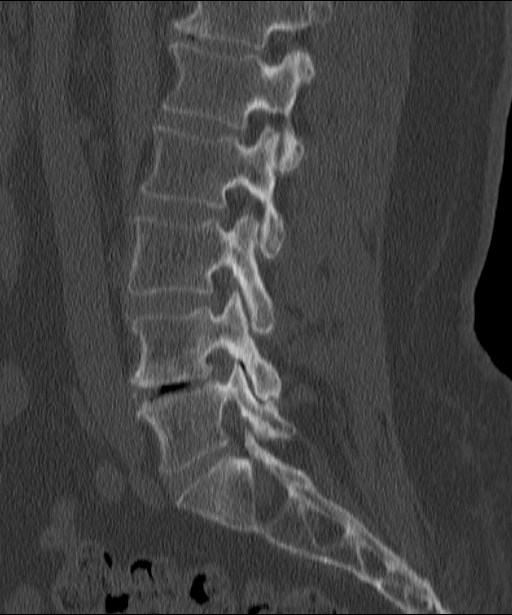
[im 40/80  soft-tissue]
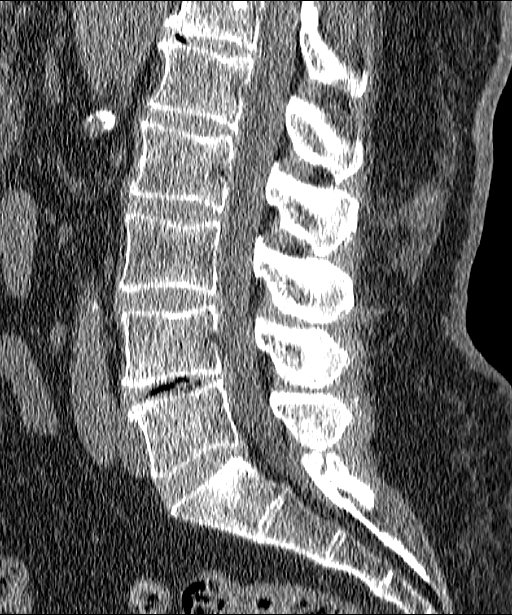
[im 40/80  bone]
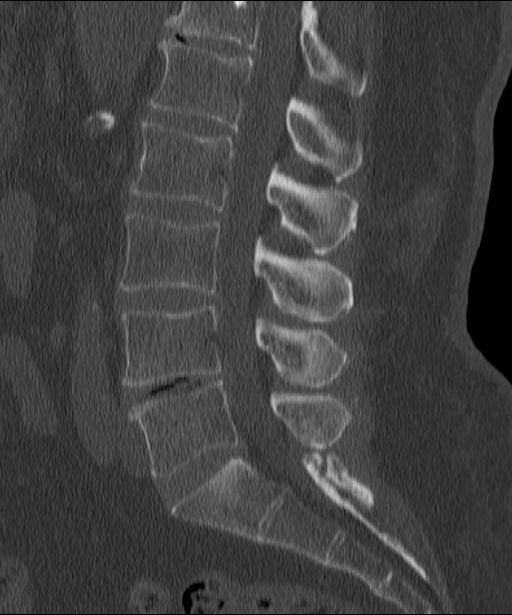
[im 47/80  bone]
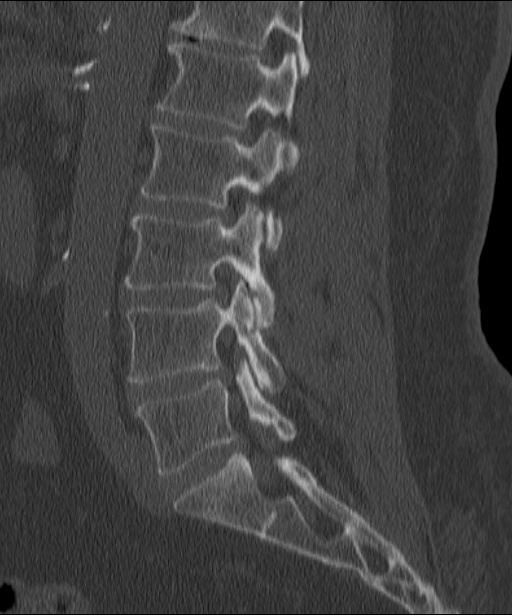
[im 53/80  bone]
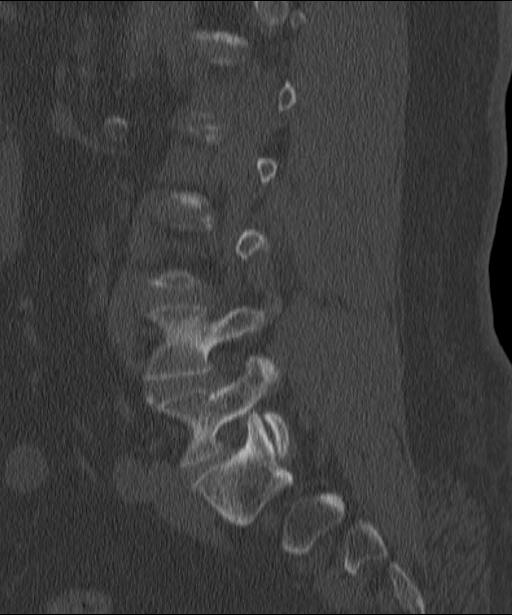

[Series 6: coronal bone · coronal · 0.45mm/px · 3 of 80 slices shown]
[im 16/80  bone]
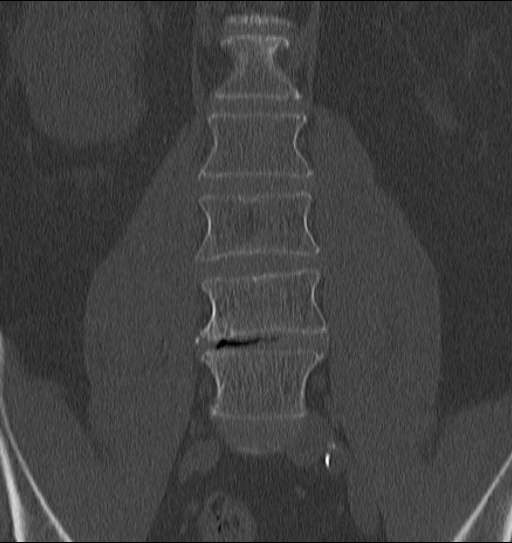
[im 32/80  bone]
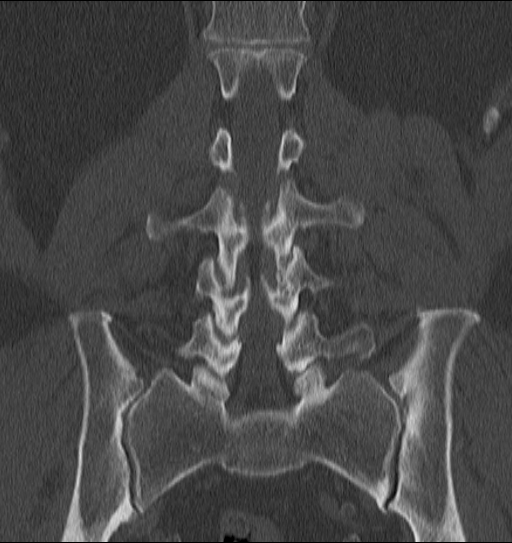
[im 48/80  bone]
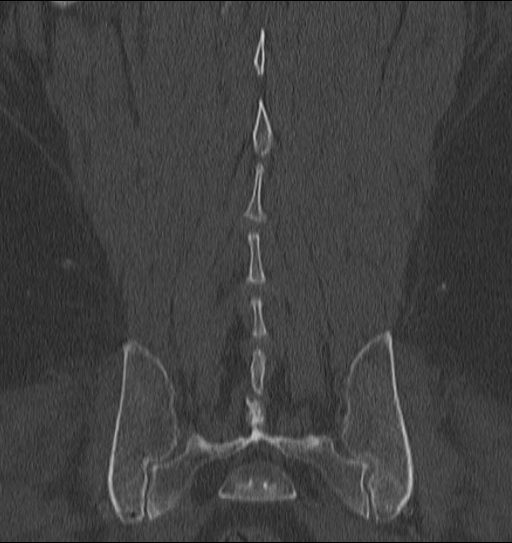

[14 of 33 positions shown; findings below may reference images not displayed]

FINDINGS: There are 5 non-rib-bearing vertebral bodies. The alignment of the
lumbosacral spine is normal. No evidence of fracture or subluxation.
There are multilevel osteoarthritic changes worse at T12-L1, L4-L5
and L5-S1, with disc space narrowing, mild endplate sclerosis and
small osteophyte formation. There is broad-based central disc
protrusion and L5-S1, without significant neural foramina narrowing.
No evidence of bony neural foramina narrowing at any level.
IMPRESSION: No evidence of fracture or subluxation of the lumbosacral spine.

Mild multilevel osteoarthritic changes, without evidence of bony
neural foramina narrowing.

## 2016-12-31 IMAGING — MR MR FOOT*R* WO/W CM
9 series · 38 of 40 positions shown · IV contrast (20 ML MULTIHANCE)
Comparison: Radiographs 05/04/2015.

CLINICAL DATA: Two or 3 week history of pain, swelling and erythema
of the great toe with ulceration and drainage. Evaluate for
osteomyelitis. Initial encounter.

EXAM:
MRI OF THE RIGHT FOREFOOT WITHOUT AND WITH CONTRAST
TECHNIQUE: Multiplanar, multisequence MR imaging was performed both before and
after administration of intravenous contrast.
CONTRAST:  20mL MULTIHANCE GADOBENATE DIMEGLUMINE 529 MG/ML IV SOLN

[Series 10: STIR · sagittal · 4.0mm · 0.39mm/px · 4 of 28 slices shown]
[im 1/28]
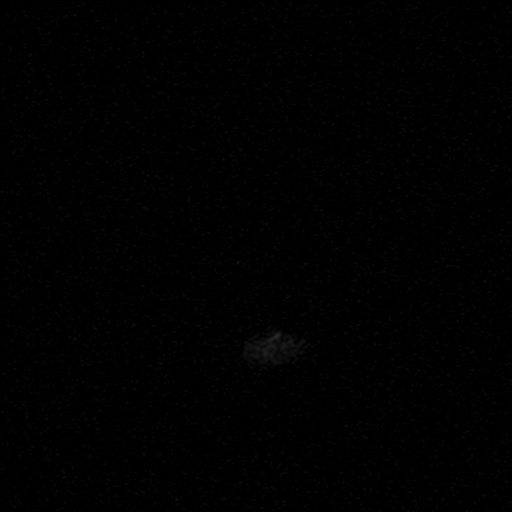
[im 10/28]
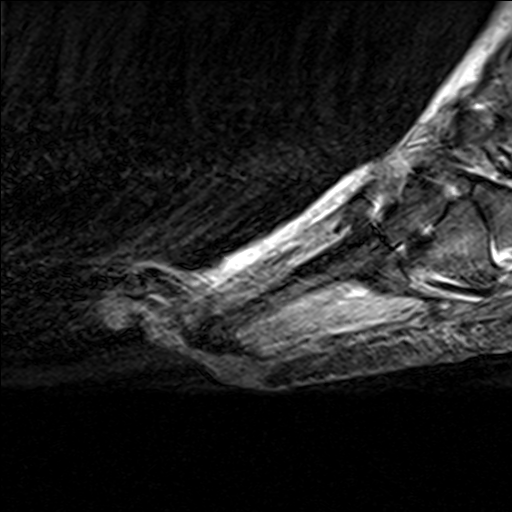
[im 19/28]
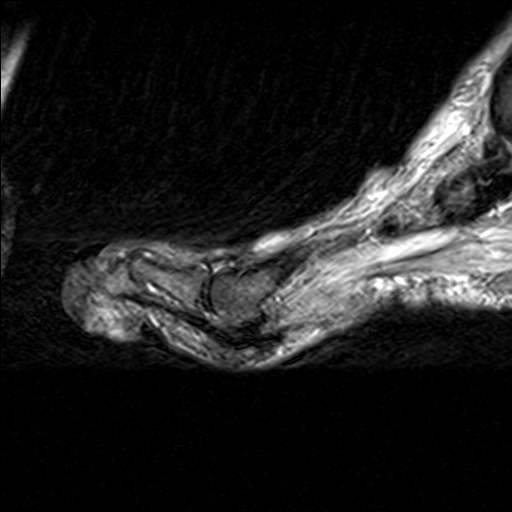
[im 28/28]
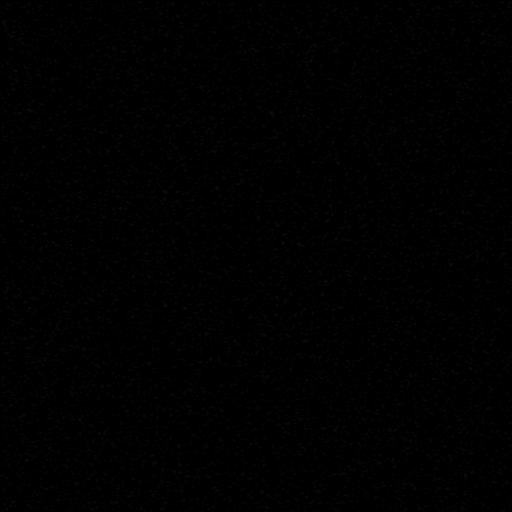

[Series 11: T1 fat-sat · coronal · 3.0mm · 0.78mm/px · 5 of 41 slices shown (1 of 2)]
[im 1/41]
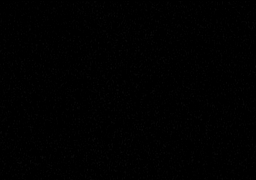
[im 11/41]
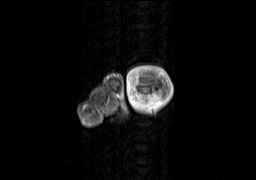
[im 21/41]
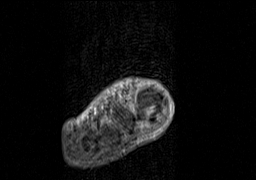
[im 31/41]
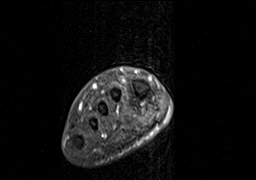
[im 41/41]
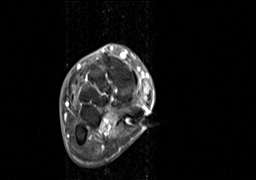

[Series 12: T1 fat-sat · sagittal · 4.0mm · 0.78mm/px · 4 of 28 slices shown (2 of 2)]
[im 1/28]
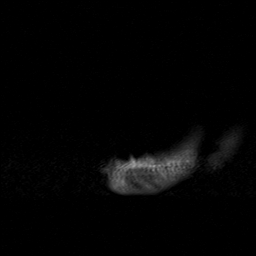
[im 10/28]
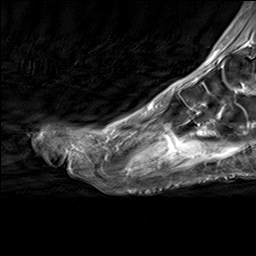
[im 19/28]
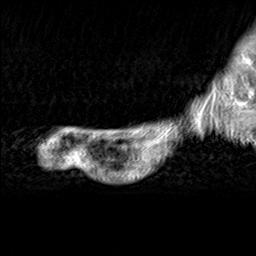
[im 28/28]
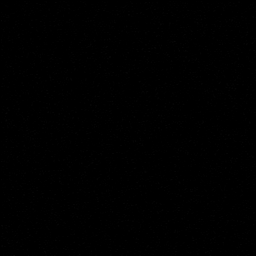

[Series 100: axial t1_fil_1 · coronal · 3.0mm · 0.62mm/px · 5 of 41 slices shown]
[im 1/41]
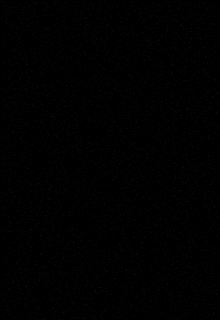
[im 11/41]
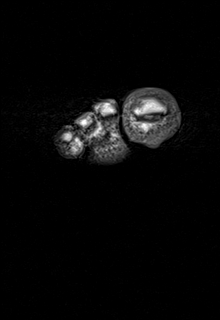
[im 21/41]
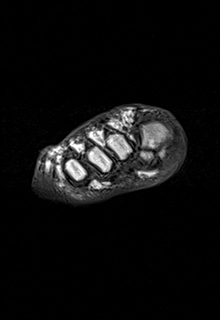
[im 31/41]
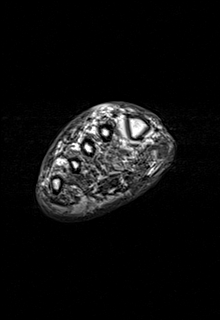
[im 41/41]
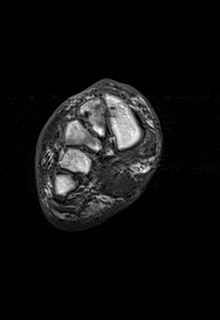

[Series 101: axial t1fs (only · coronal · 3.0mm · 0.78mm/px · 5 of 40 slices shown]
[im 1/40]
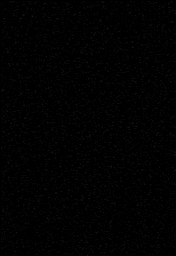
[im 10/40]
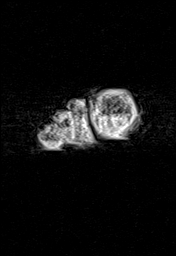
[im 20/40]
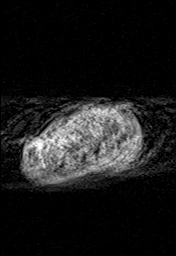
[im 30/40]
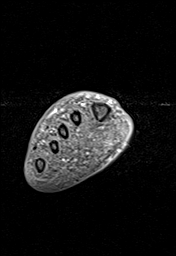
[im 40/40]
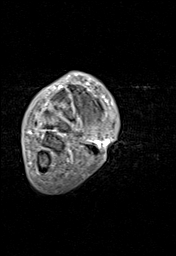

[Series 102: coronal t1_fil_1 · axial · 3.0mm · 0.66mm/px · z∈[-163,-82]mm · 4 of 29 slices shown]
[im 1/29]
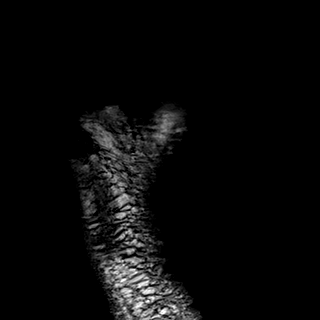
[im 10/29]
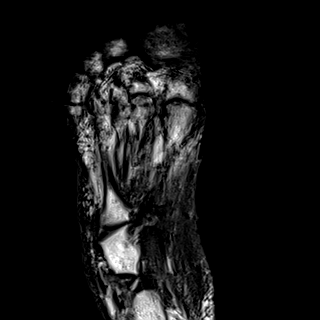
[im 19/29]
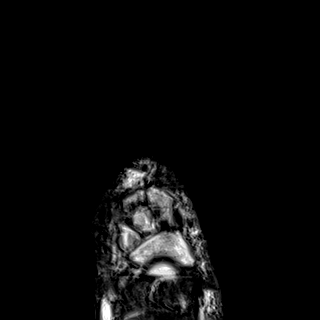
[im 29/29]
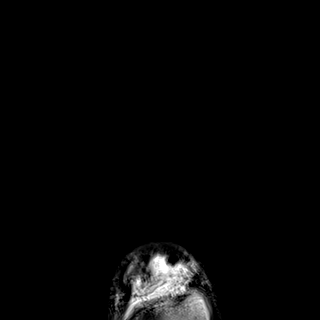

[Series 103: coronal stir_fil_1 · axial · 3.0mm · 0.41mm/px · z∈[-163,-82]mm · 4 of 29 slices shown]
[im 1/29]
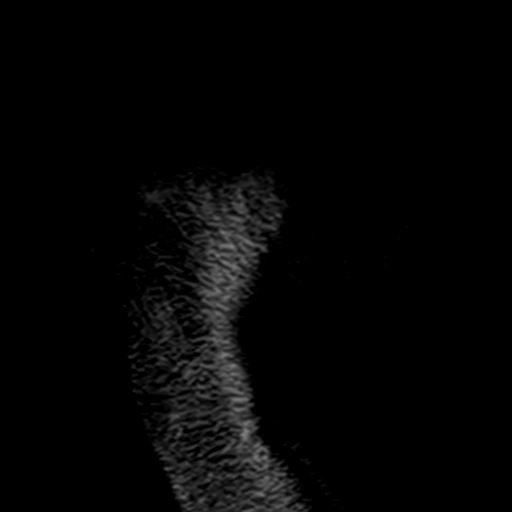
[im 10/29]
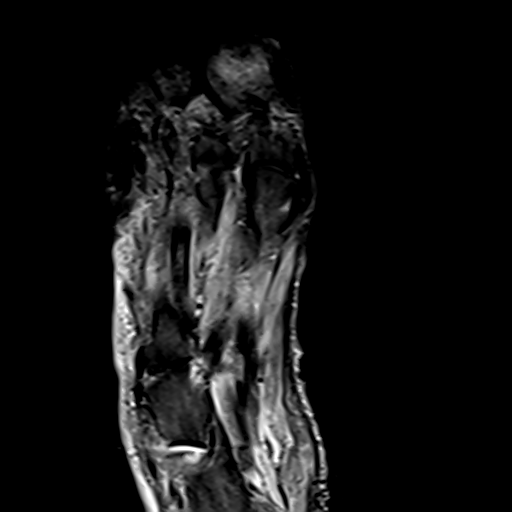
[im 19/29]
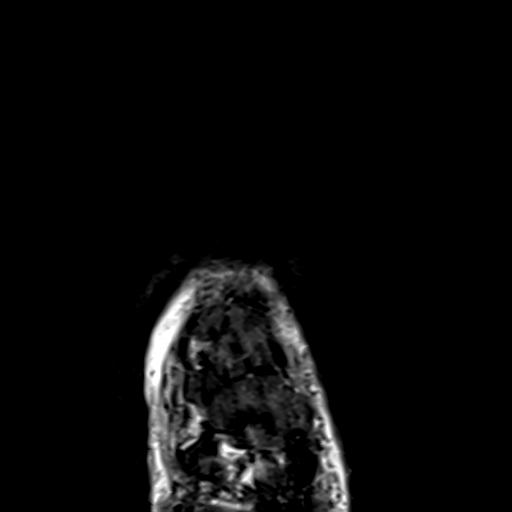
[im 29/29]
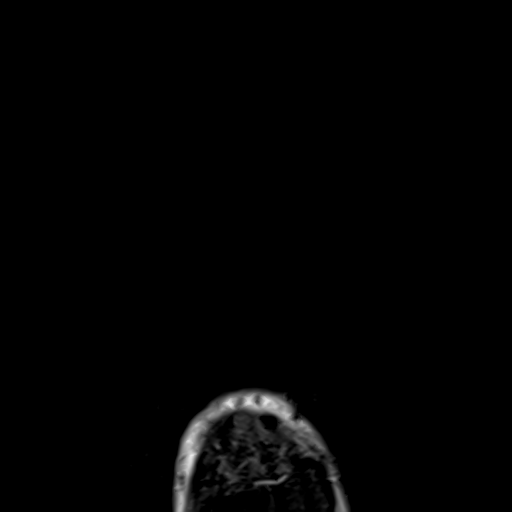

[Series 104: T2 · coronal · 3.0mm · 0.39mm/px · 3 of 41 slices shown]
[im 1/41]
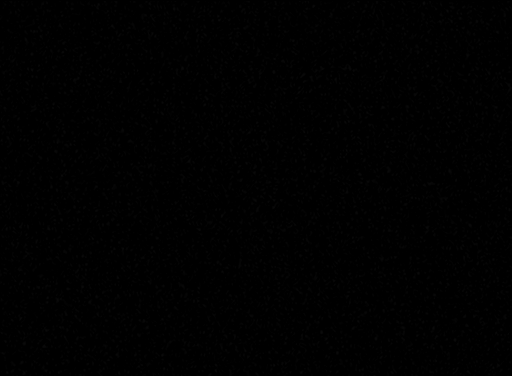
[im 11/41]
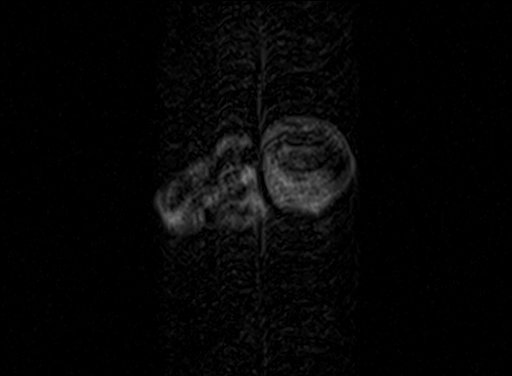
[im 21/41]
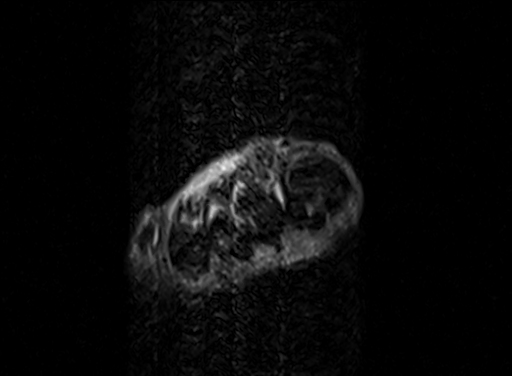

[Series 105: T1 · axial · 3.0mm · 0.82mm/px · z∈[-184,-107]mm · 4 of 29 slices shown]
[im 1/29]
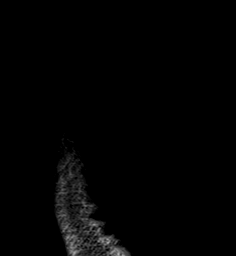
[im 10/29]
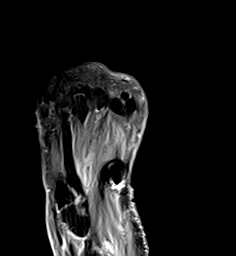
[im 19/29]
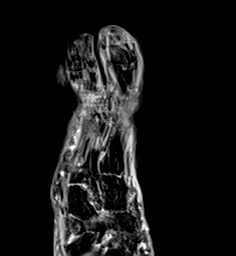
[im 29/29]
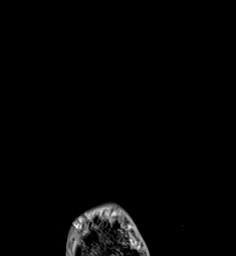

[38 of 40 positions shown; findings below may reference images not displayed]

FINDINGS: Despite efforts by the technologist and patient, significant motion
artifact is present on today's exam and could not be eliminated.
This reduces exam sensitivity and specificity.

There is soft tissue T2 hyperintensity throughout the forefoot,
greatest medially. Post-contrast images demonstrate diffuse soft
tissue enhancement throughout the great toe. Plantar to the distal
phalanx, there is a nonenhancing 10 x 21 x 23 mm focus of T2
hyperintensity which probably reflects a superficial abscess. No
other focal fluid collections are identified.

There is susceptibility artifact in the plantar aspect of the
midfoot near the base of the first metatarsal, likely secondary to
an incidental metallic foreign body, not imaged on recent
radiographs. Patient did have a tiny metallic foreign body more
laterally in the midfoot on 4997 radiographs.

There is nonspecific T2 hyperintensity and enhancement within the
distal phalanx of the great toe which may be related to the tuftal
fracture seen on today's radiographs. No cortical destruction or
more proximal marrow changes are identified to strongly suggest
osteomyelitis. The proximal phalanx and first metatarsal appear
normal. The additional toes and metatarsals appear normal.
IMPRESSION: 1. Study is significantly motion degraded, limiting sensitivity and
specificity.
2. Inflammatory changes in the great toe with probable small
superficial abscess plantar to the distal phalanx.
3. Nonspecific bone marrow edema and enhancement within the distal
phalanx of the great toe. These marrow changes could be related to
the known fracture and do not necessarily imply superimposed
osteomyelitis. No cortical destruction identified.
4. Suspected metallic foreign body within the plantar aspect of the
midfoot near the base of the first metatarsal with resulting
susceptibility artifact. In discussion with Dr. Adjei, the patient
does not have any inflammatory change in this area to suggest this
represents soft tissue emphysema.

## 2017-01-01 IMAGING — CR DG CHEST 2V
1 series · 2 of 2 positions shown · non-contrast
Comparison: Prior chest x-ray 04/10/2015

CLINICAL DATA: 59-year-old male with shortness of breath on
exertion

EXAM:
CHEST  2 VIEW

[Series 5: x chest ap · 0.14mm/px · 2 of 2 slices shown]
[im 1/2]
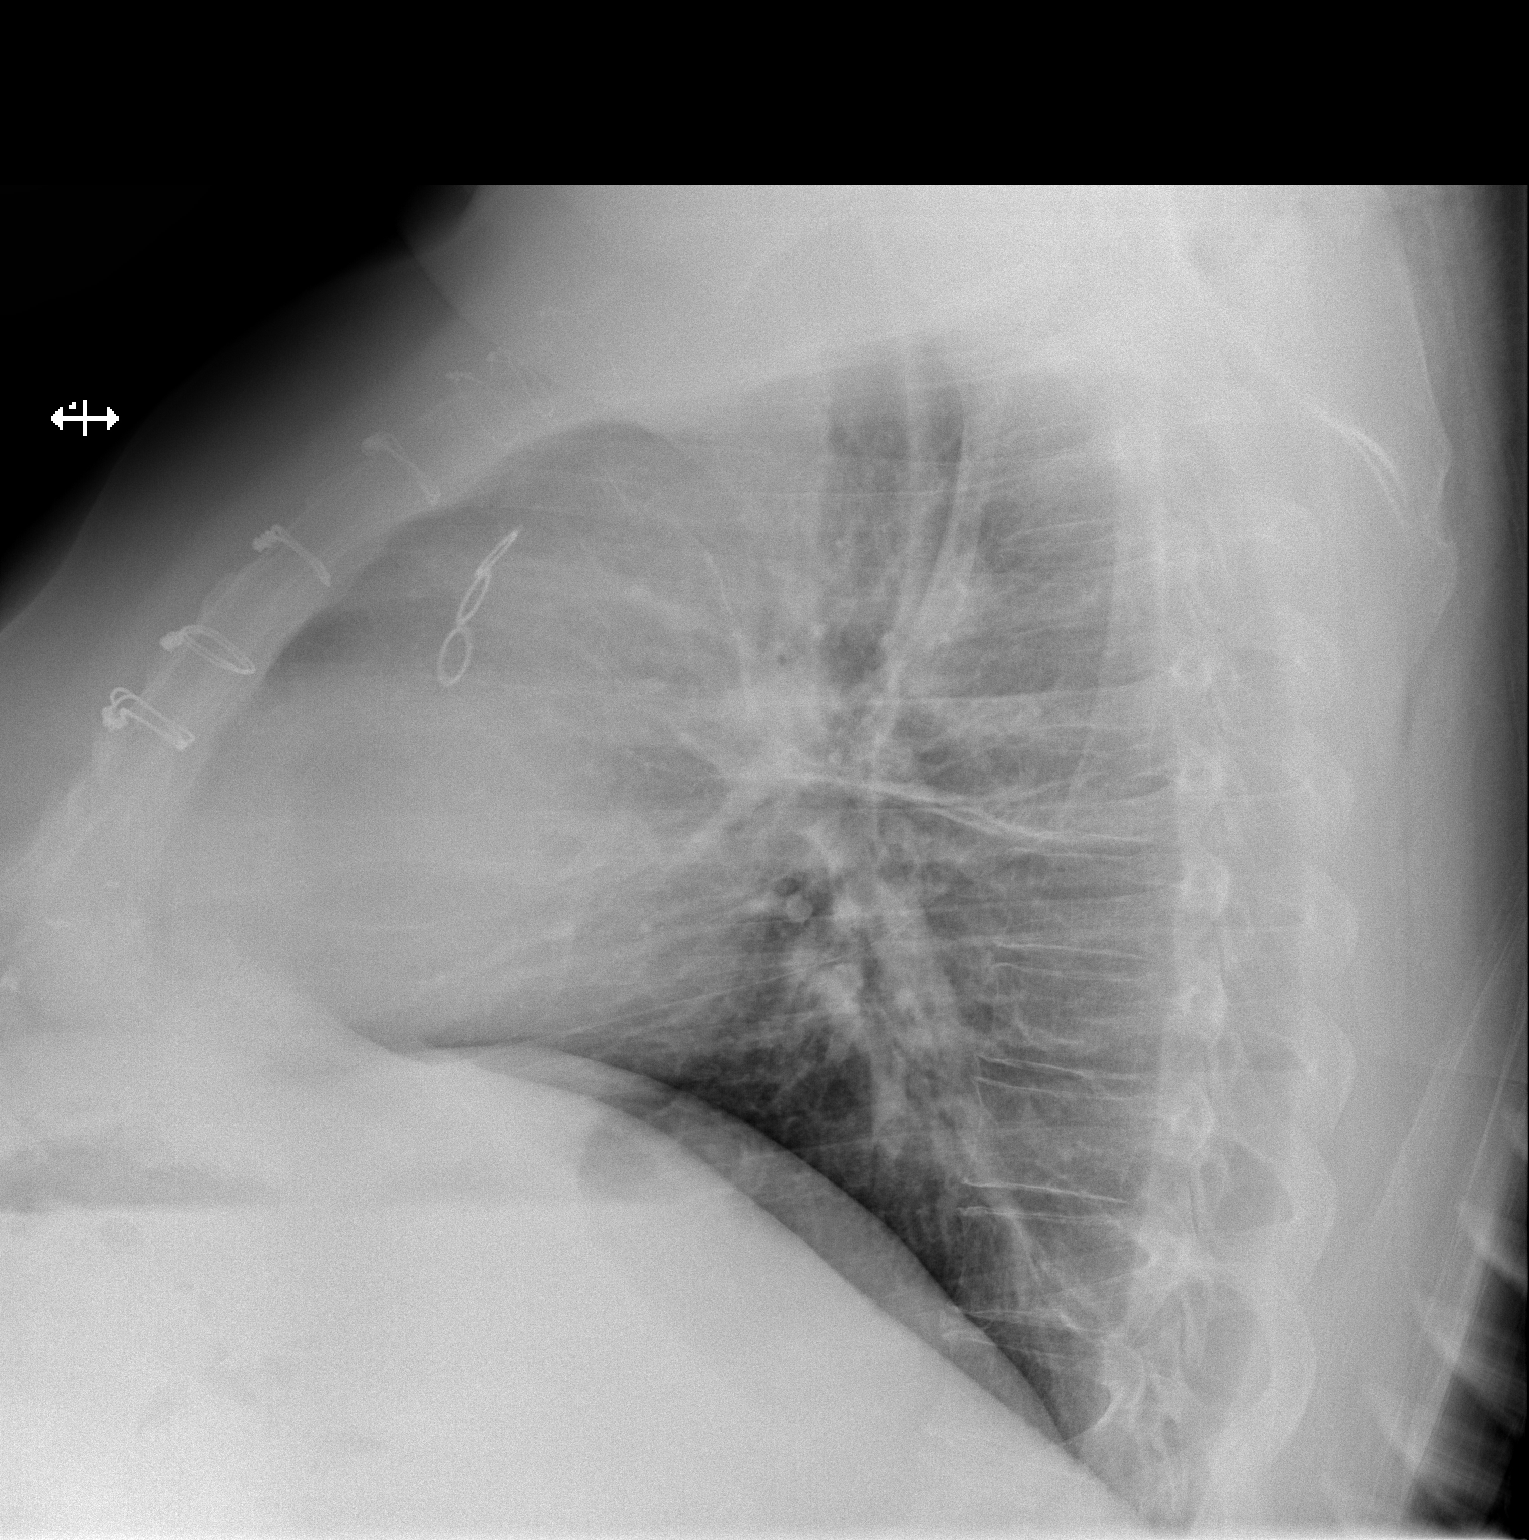
[im 2/2]
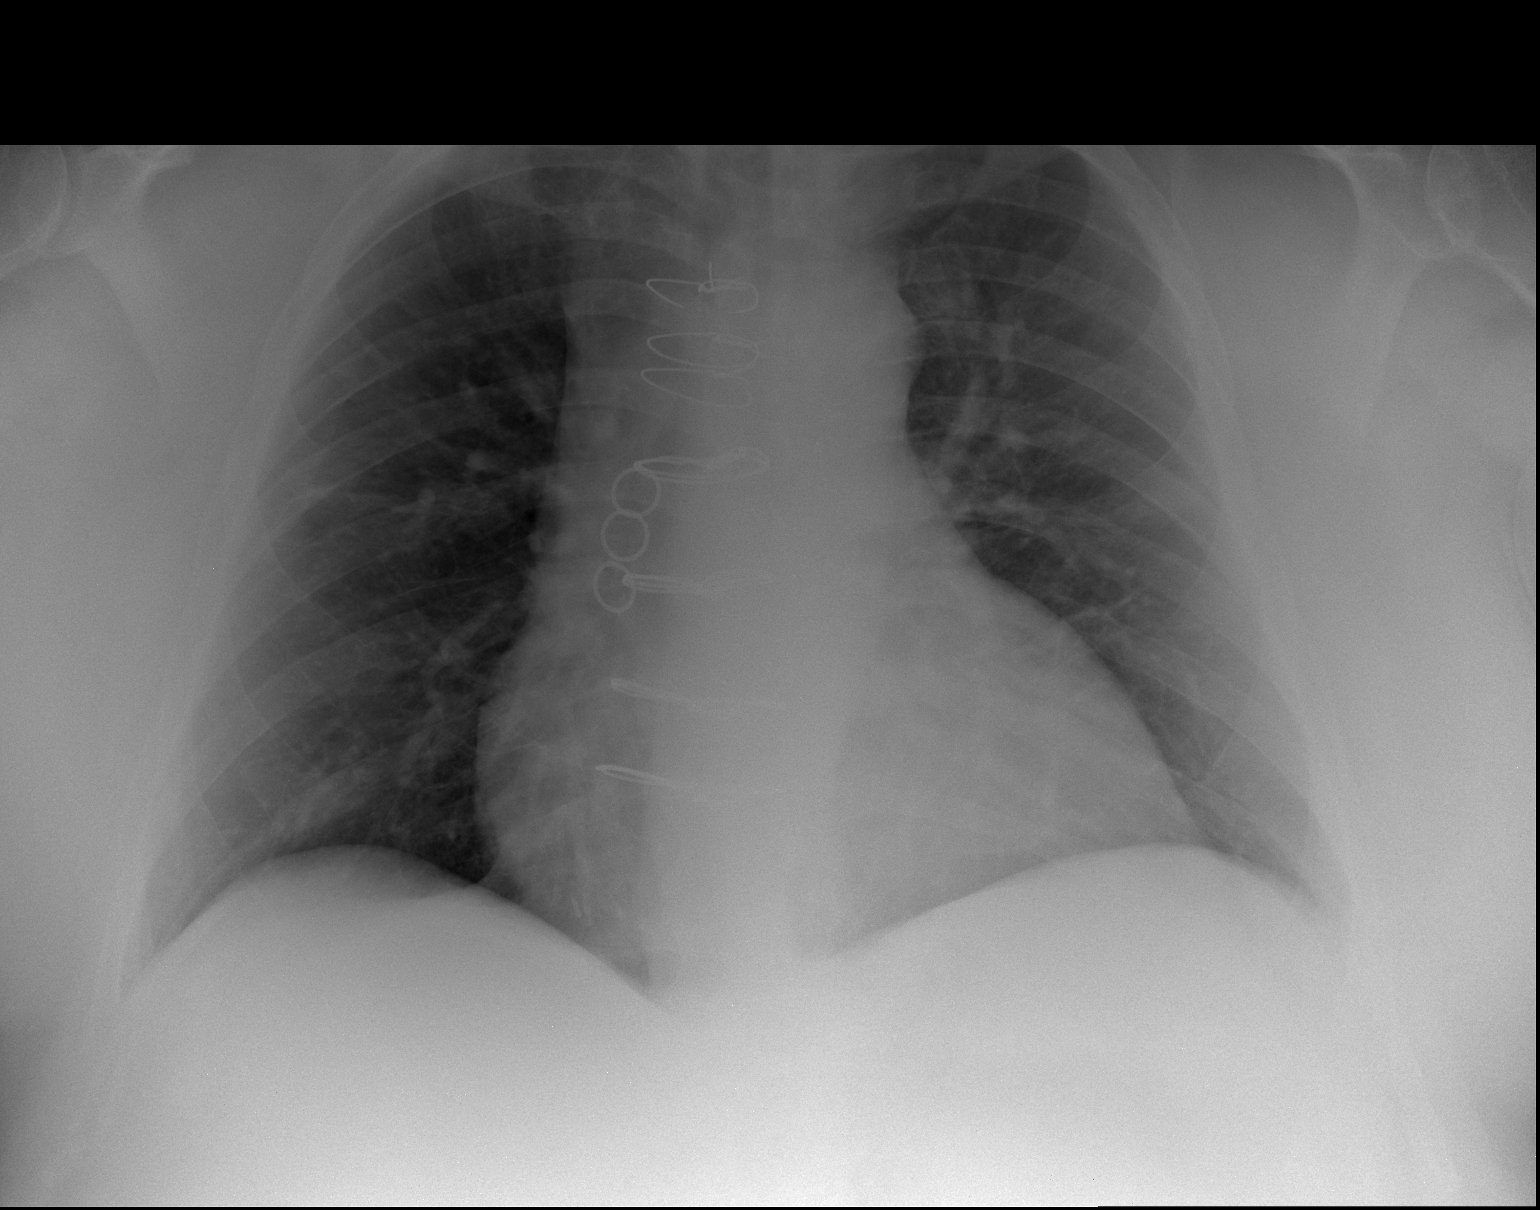

[2 of 2 positions shown; findings below may reference images not displayed]

FINDINGS: Stable cardiomegaly. Mediastinal contours remain unchanged. Patient
is status post median sternotomy with evidence of multivessel CABG.
Mild vascular congestion without overt pulmonary edema. No focal
airspace consolidation, pleural effusion or pneumothorax. No
pulmonary mass or nodule. No acute osseous abnormality.
IMPRESSION: Stable cardiomegaly and pulmonary vascular congestion without overt
edema.

## 2017-01-10 IMAGING — CR DG CHEST 2V
1 series · 2 of 2 positions shown · non-contrast
Comparison: May 07, 2015

CLINICAL DATA: Shortness of breath today.

EXAM:
CHEST  2 VIEW

[Series 1: dg chest 2 view · 0.14mm/px · 2 of 2 slices shown]
[im 1/2]
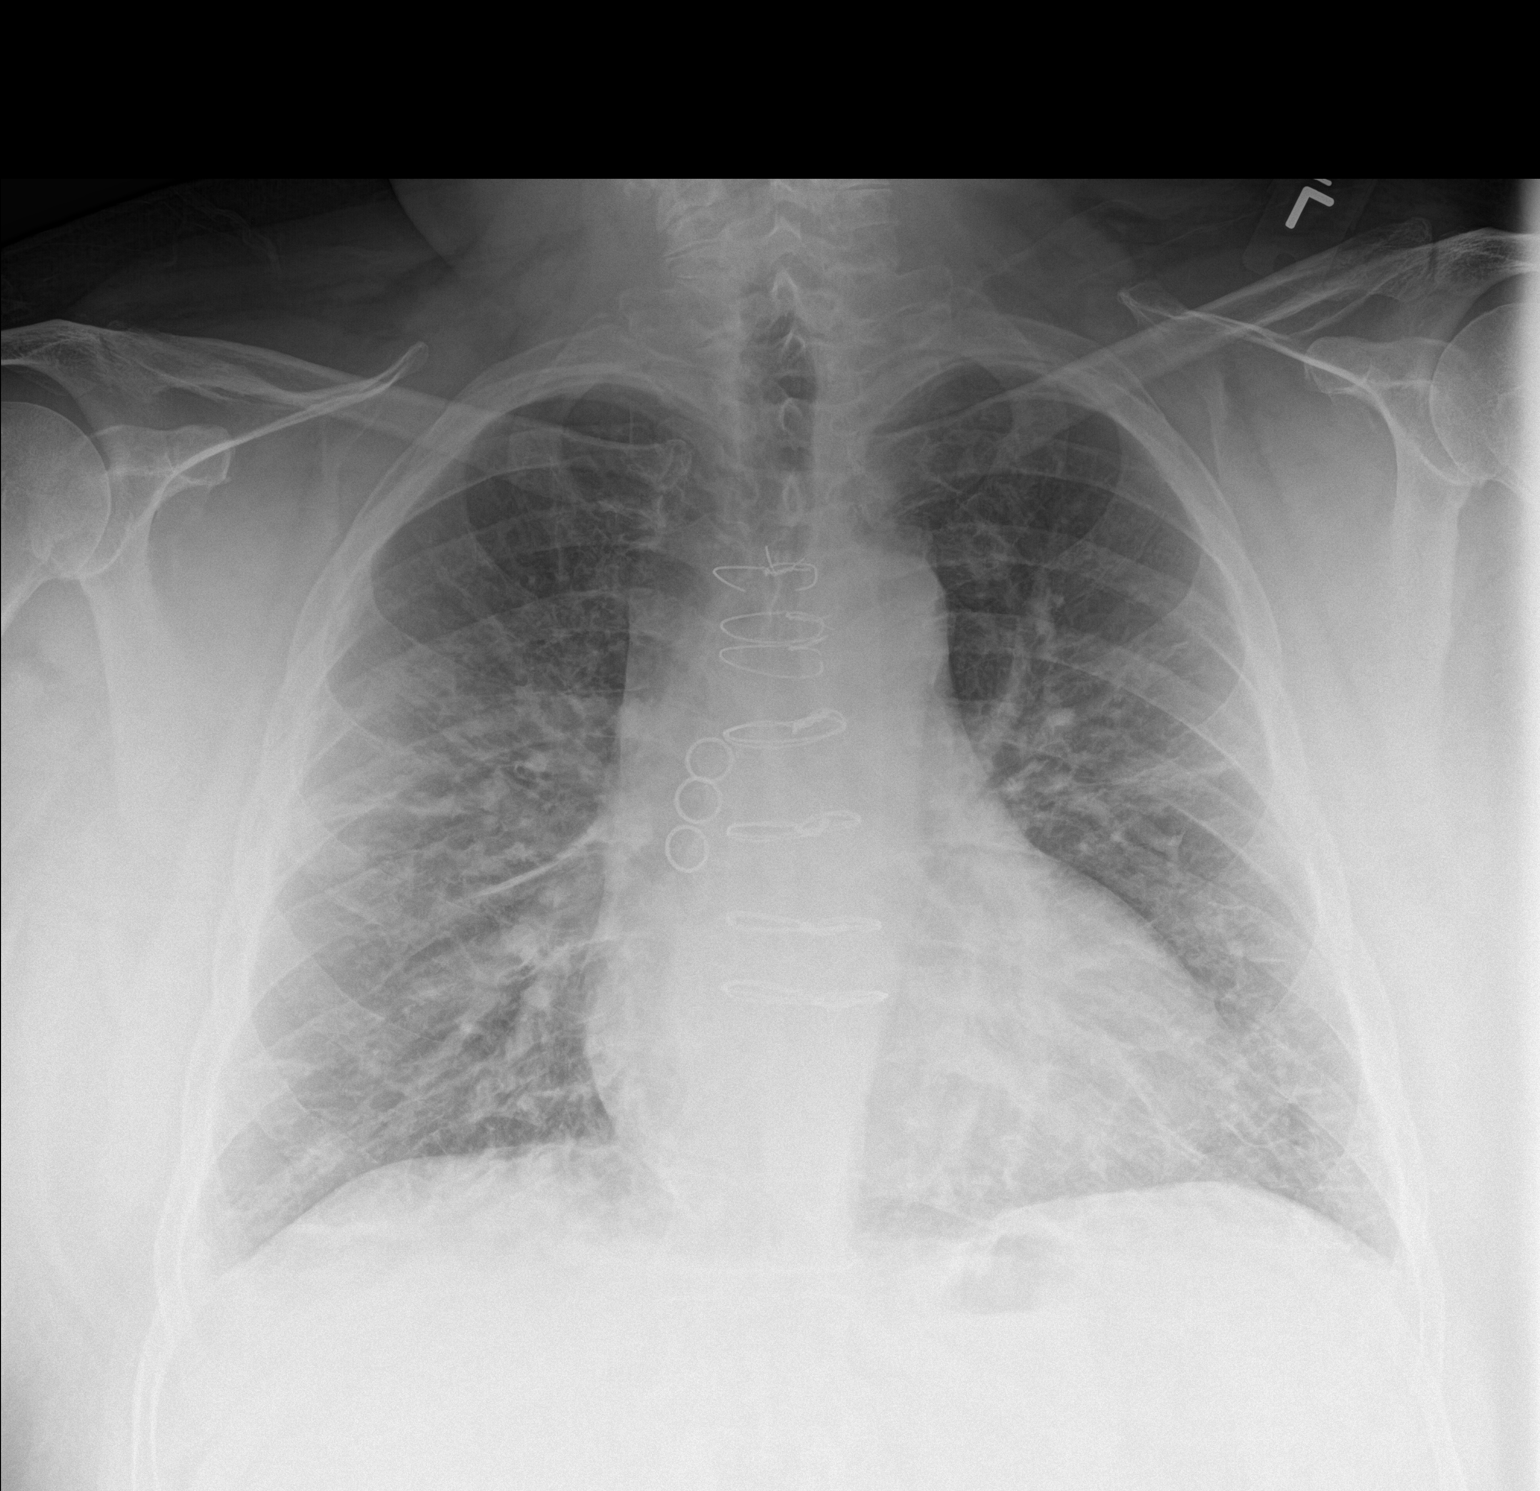
[im 2/2]
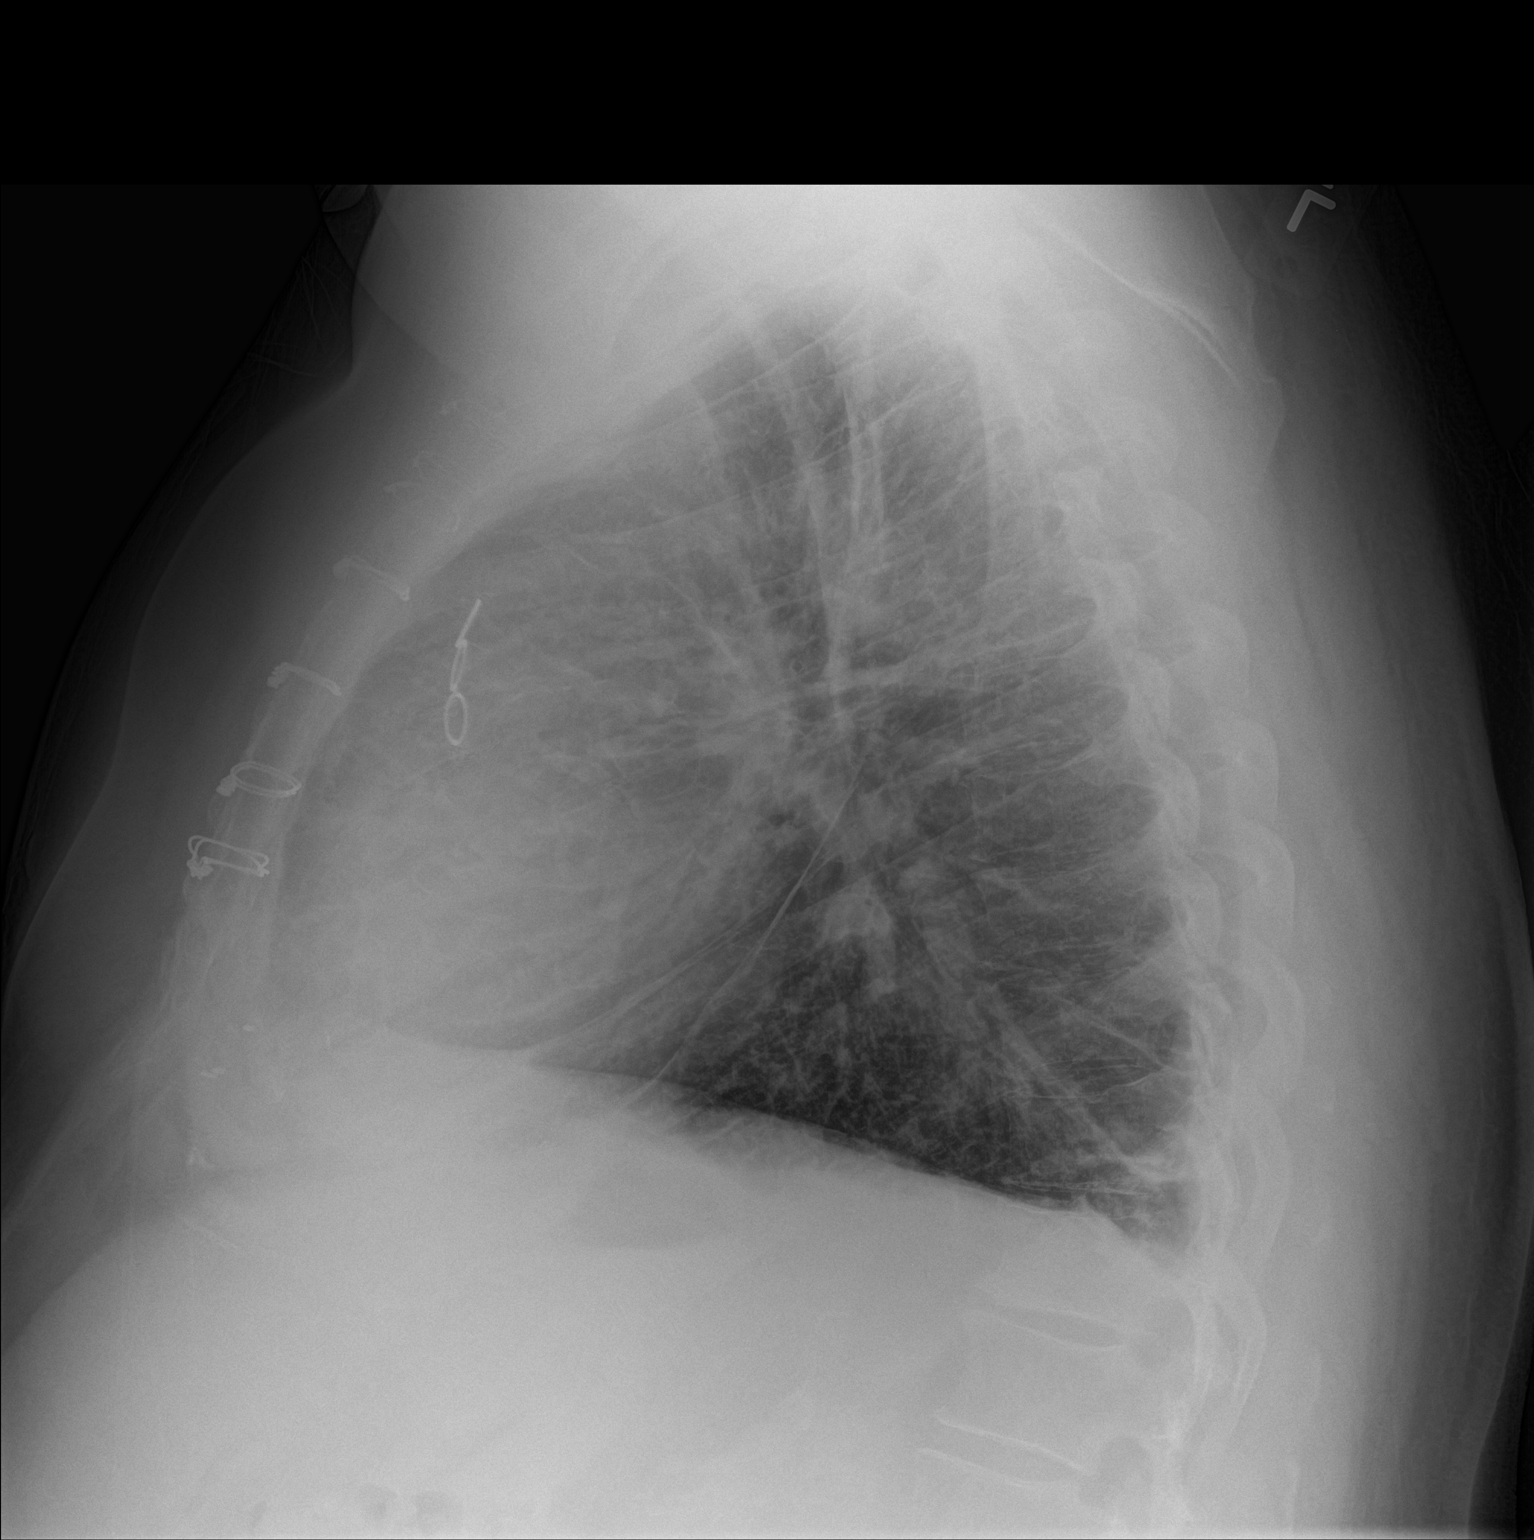

[2 of 2 positions shown; findings below may reference images not displayed]

FINDINGS: The heart size and mediastinal contours are stable. The heart size
is mildly enlarged. There is increased interstitium bilaterally.
There is no focal pneumonia or pleural effusion. The visualized
skeletal structures are unremarkable.
IMPRESSION: Mild interstitial edema.  Cardiomegaly.

## 2017-01-13 DIAGNOSIS — E113293 Type 2 diabetes mellitus with mild nonproliferative diabetic retinopathy without macular edema, bilateral: Secondary | ICD-10-CM | POA: Diagnosis not present

## 2017-01-13 DIAGNOSIS — E1142 Type 2 diabetes mellitus with diabetic polyneuropathy: Secondary | ICD-10-CM | POA: Diagnosis not present

## 2017-01-13 DIAGNOSIS — Z794 Long term (current) use of insulin: Secondary | ICD-10-CM | POA: Diagnosis not present

## 2017-01-13 DIAGNOSIS — R001 Bradycardia, unspecified: Secondary | ICD-10-CM | POA: Diagnosis not present

## 2017-01-13 DIAGNOSIS — I952 Hypotension due to drugs: Secondary | ICD-10-CM | POA: Diagnosis not present

## 2017-01-13 DIAGNOSIS — E11621 Type 2 diabetes mellitus with foot ulcer: Secondary | ICD-10-CM | POA: Diagnosis not present

## 2017-01-13 DIAGNOSIS — L97411 Non-pressure chronic ulcer of right heel and midfoot limited to breakdown of skin: Secondary | ICD-10-CM | POA: Diagnosis not present

## 2017-01-13 DIAGNOSIS — E1165 Type 2 diabetes mellitus with hyperglycemia: Secondary | ICD-10-CM | POA: Diagnosis not present

## 2017-01-14 DIAGNOSIS — Z794 Long term (current) use of insulin: Secondary | ICD-10-CM | POA: Diagnosis not present

## 2017-01-14 DIAGNOSIS — L97511 Non-pressure chronic ulcer of other part of right foot limited to breakdown of skin: Secondary | ICD-10-CM | POA: Diagnosis not present

## 2017-01-14 DIAGNOSIS — E1140 Type 2 diabetes mellitus with diabetic neuropathy, unspecified: Secondary | ICD-10-CM | POA: Diagnosis not present

## 2017-01-14 DIAGNOSIS — L851 Acquired keratosis [keratoderma] palmaris et plantaris: Secondary | ICD-10-CM | POA: Diagnosis not present

## 2017-01-14 DIAGNOSIS — B351 Tinea unguium: Secondary | ICD-10-CM | POA: Diagnosis not present

## 2017-01-14 IMAGING — US US ABDOMEN COMPLETE
1 series · 14 of 25 positions shown · non-contrast
Comparison: 09/21/2015.

CLINICAL DATA: Abdominal pain.

EXAM:
ABDOMEN ULTRASOUND COMPLETE

[Series 1: us abdomen complete · 0.25mm/px · 14 of 101 slices shown]
[im 1/101]
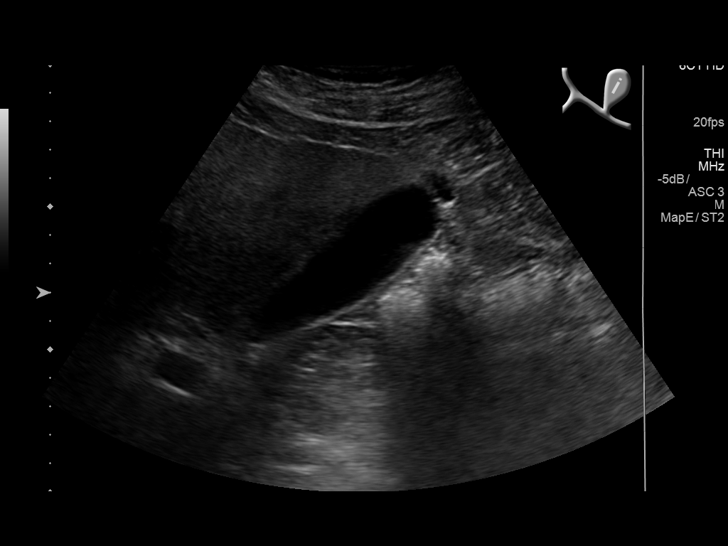
[im 9/101]
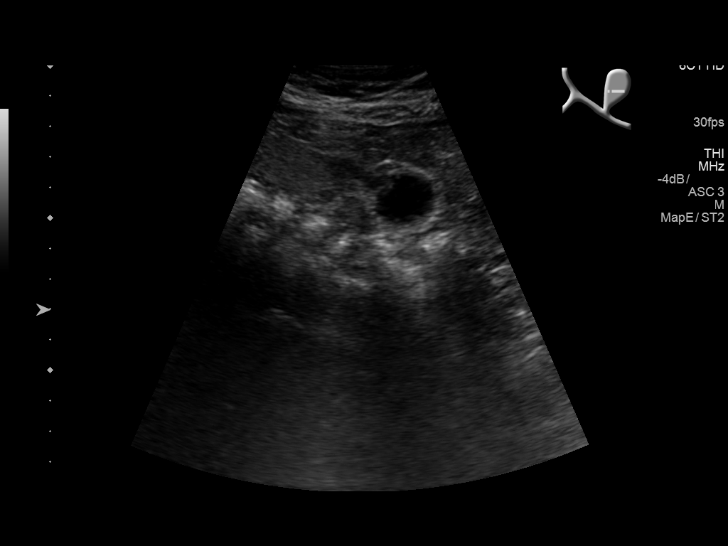
[im 17/101]
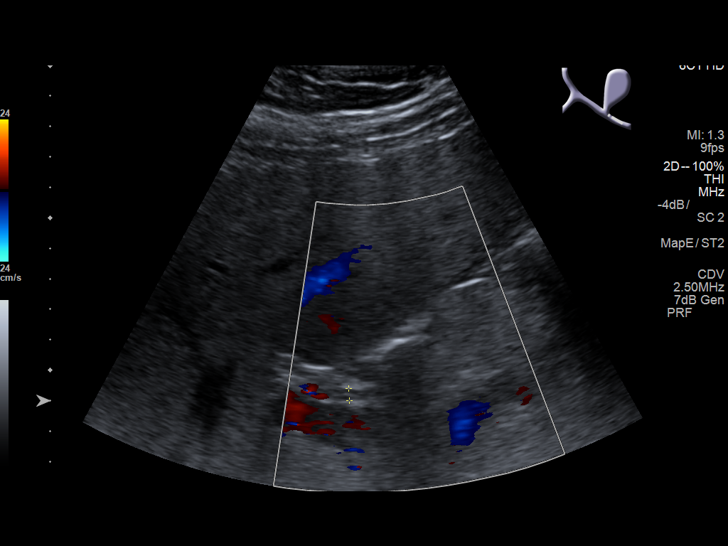
[im 26/101]
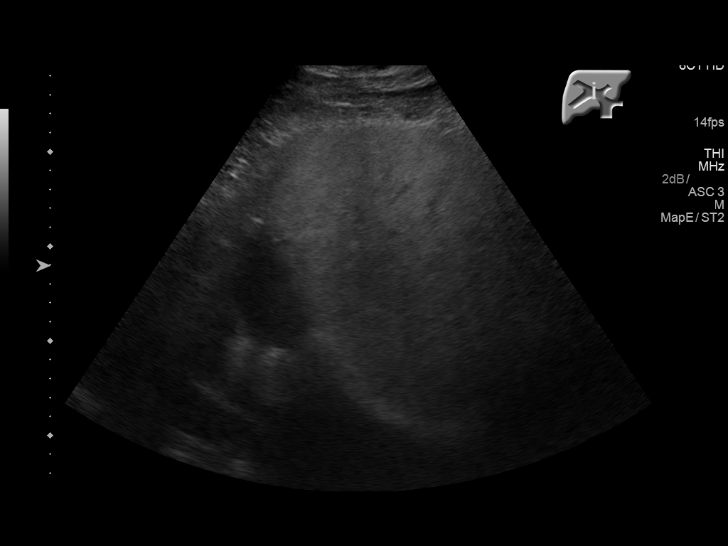
[im 34/101]
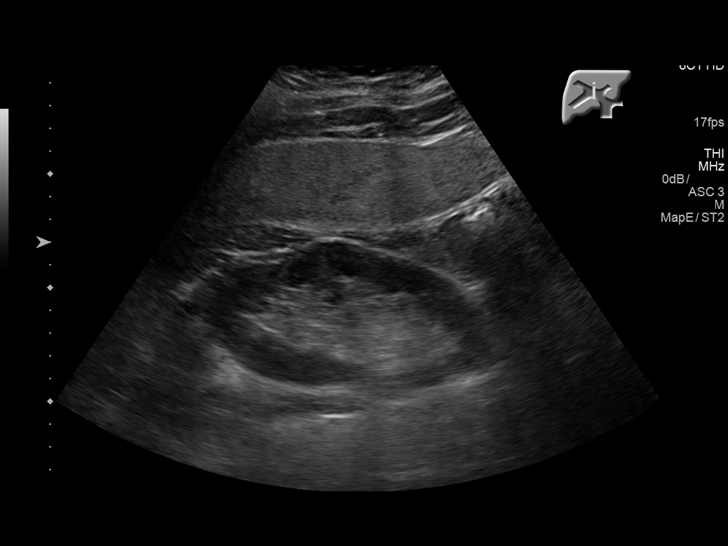
[im 38/101]
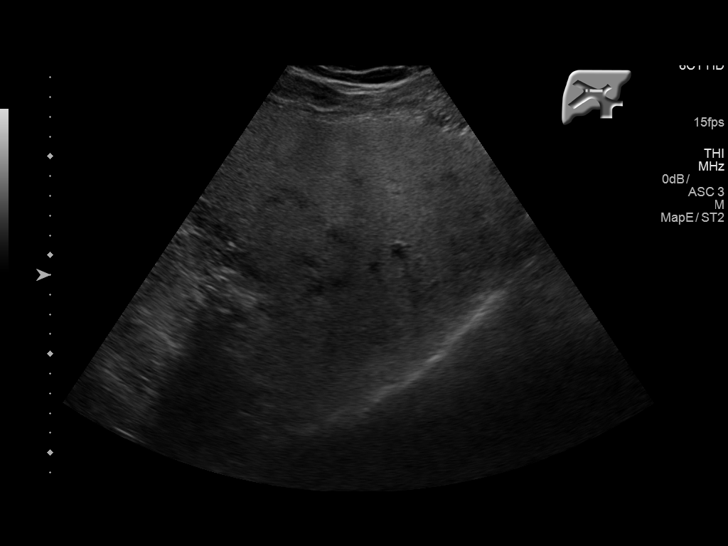
[im 46/101]
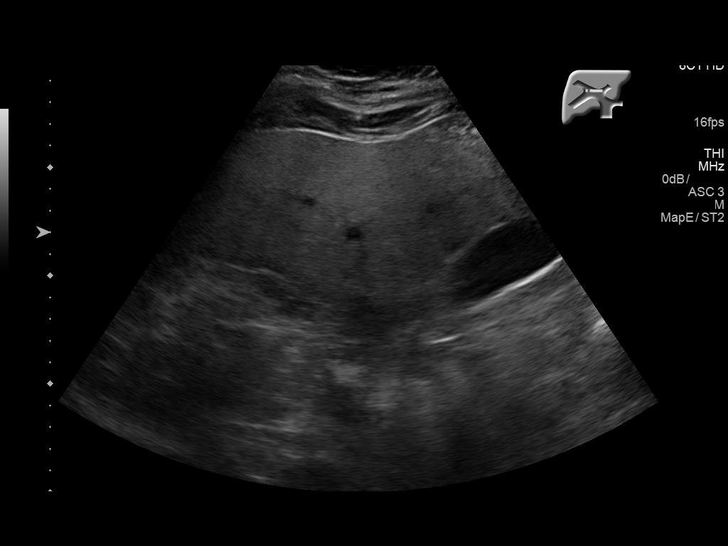
[im 55/101]
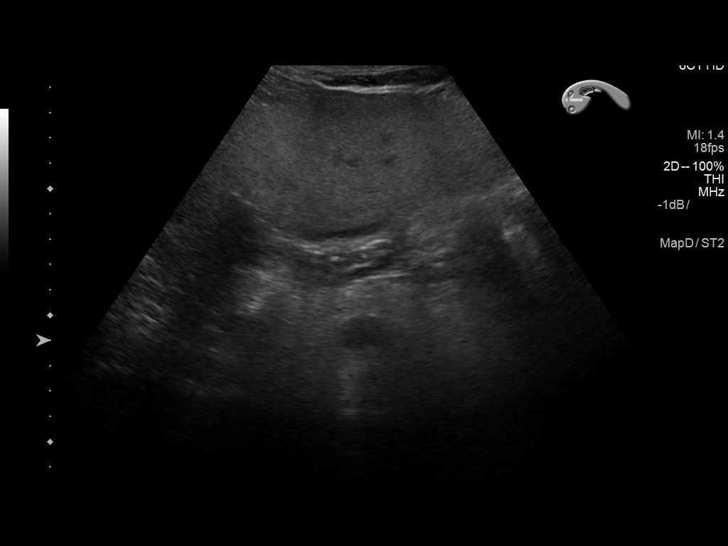
[im 63/101]
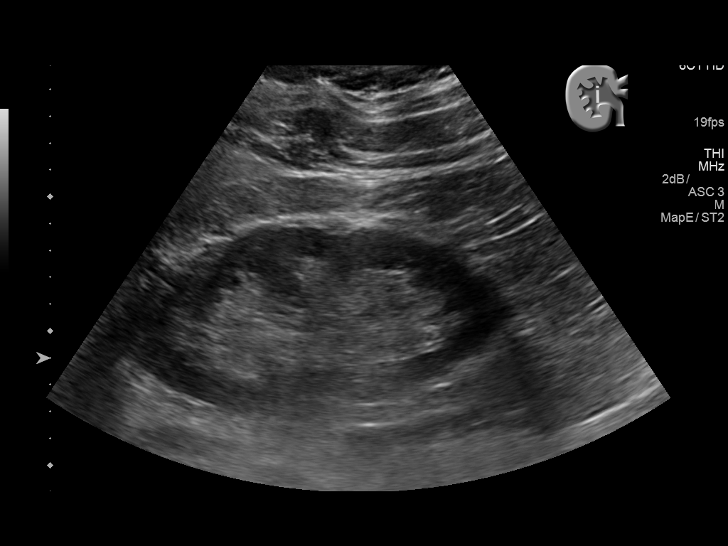
[im 67/101]
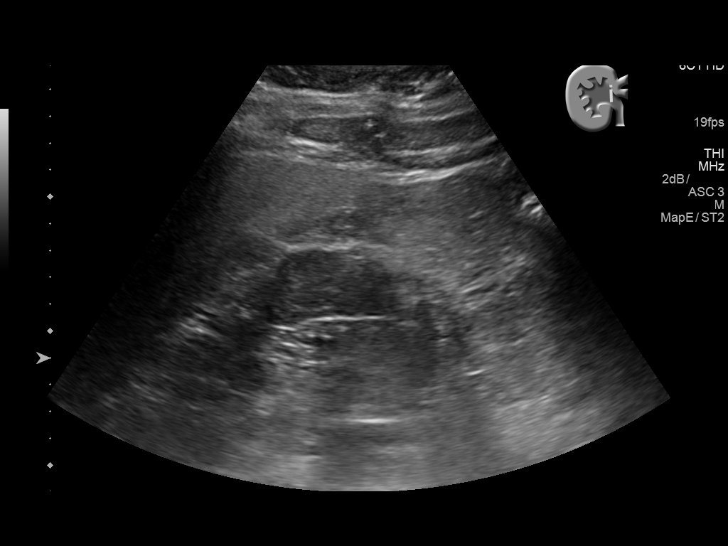
[im 76/101]
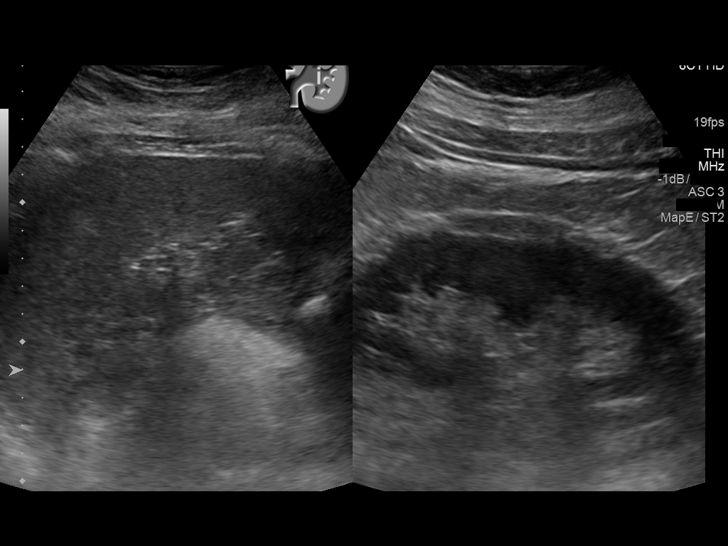
[im 84/101]
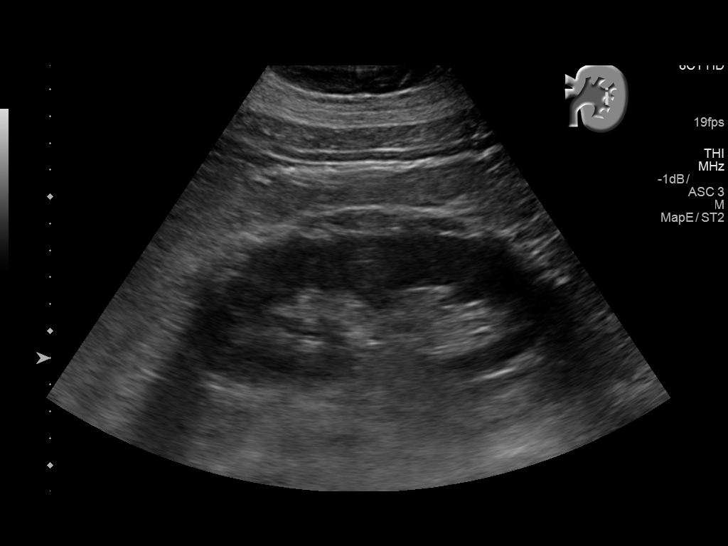
[im 92/101]
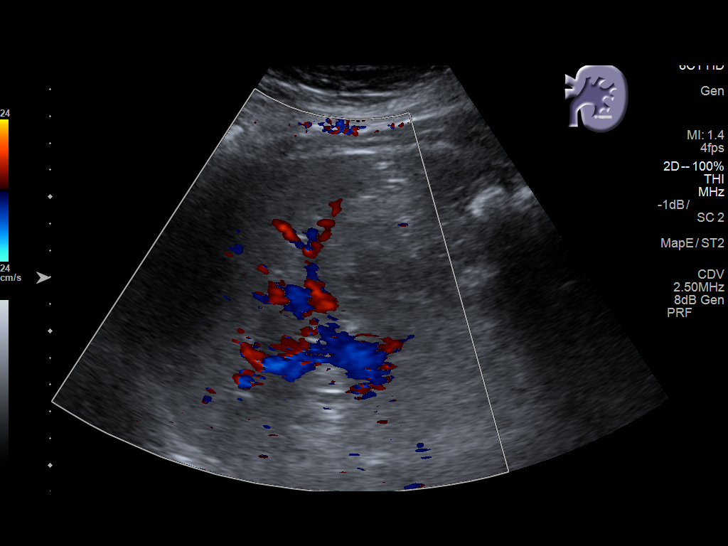
[im 101/101]
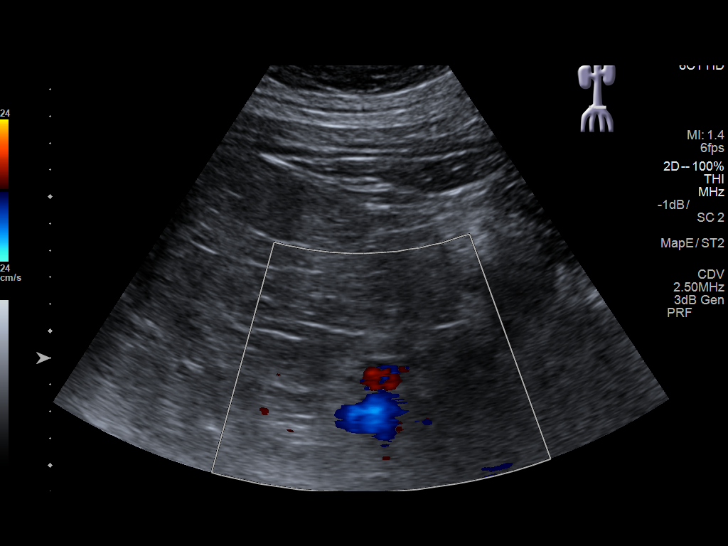

[14 of 25 positions shown; findings below may reference images not displayed]

FINDINGS: Gallbladder: No gallstones or wall thickening visualized. No
sonographic Murphy sign noted by sonographer.

Common bile duct: Diameter: 4.0 mm

Liver: Liver is echogenic consistent fatty infiltration and/or
hepatocellular disease.

IVC: No abnormality visualized.

Pancreas: Visualized portion unremarkable.

Spleen: Size and appearance within normal limits.

Right Kidney: Length: 14.6 cm. Echogenicity within normal limits. No
mass or hydronephrosis visualized.

Left Kidney: Length: 14.1 cm. Echogenicity within normal limits. No
mass or hydronephrosis visualized.

Abdominal aorta: No aneurysm visualized.

Other findings: None.
IMPRESSION: No acute or focal abnormality identified. No gallstones or biliary
distention. Liver is echogenic consistent with fatty infiltration
and/or hepatocellular disease.

## 2017-01-15 DIAGNOSIS — I10 Essential (primary) hypertension: Secondary | ICD-10-CM | POA: Diagnosis not present

## 2017-01-15 DIAGNOSIS — I480 Paroxysmal atrial fibrillation: Secondary | ICD-10-CM | POA: Diagnosis not present

## 2017-01-15 DIAGNOSIS — M5440 Lumbago with sciatica, unspecified side: Secondary | ICD-10-CM | POA: Diagnosis not present

## 2017-01-15 DIAGNOSIS — I350 Nonrheumatic aortic (valve) stenosis: Secondary | ICD-10-CM | POA: Diagnosis not present

## 2017-01-15 DIAGNOSIS — I25810 Atherosclerosis of coronary artery bypass graft(s) without angina pectoris: Secondary | ICD-10-CM | POA: Diagnosis not present

## 2017-01-15 DIAGNOSIS — E7800 Pure hypercholesterolemia, unspecified: Secondary | ICD-10-CM | POA: Diagnosis not present

## 2017-01-16 IMAGING — CT CT ABDOMEN W/ CM
2 of 5 series · 15 of 46 positions shown, 17 images · IV contrast (omnipaque)
Comparison: CT abdomen pelvis dated 03/11/2014

CLINICAL DATA: Left upper quadrant abdominal pain

EXAM:
CT ABDOMEN WITH CONTRAST
TECHNIQUE: Multidetector CT imaging of the abdomen was performed using the
standard protocol following bolus administration of intravenous
contrast.
CONTRAST:  100mL OMNIPAQUE IOHEXOL 350 MG/ML SOLN

[Series 2: routine with · axial · 0.98mm/px · z∈[-942,-696]mm · 12 of 59 slices shown, 14 images]
[im 5/59  soft-tissue]
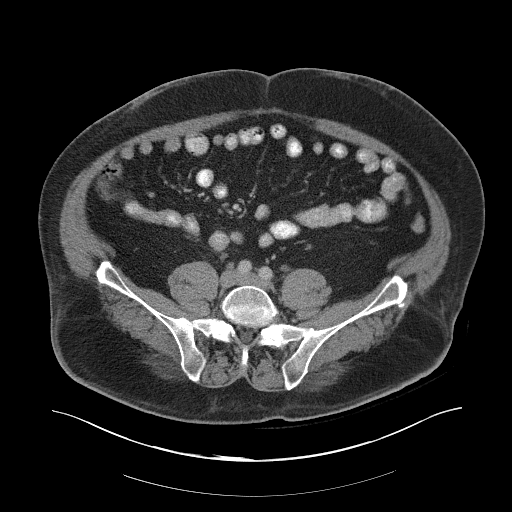
[im 5/59  bone]
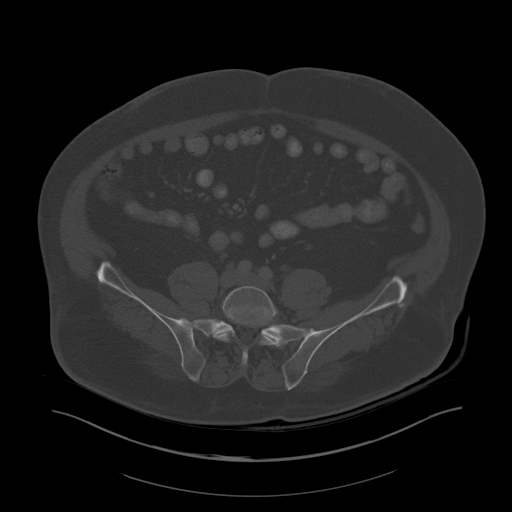
[im 9/59  soft-tissue]
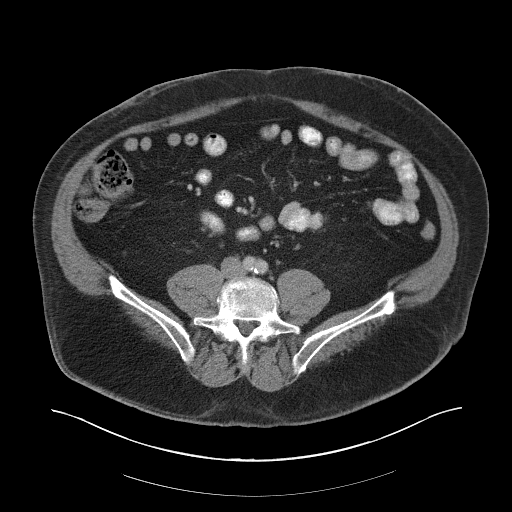
[im 13/59  soft-tissue]
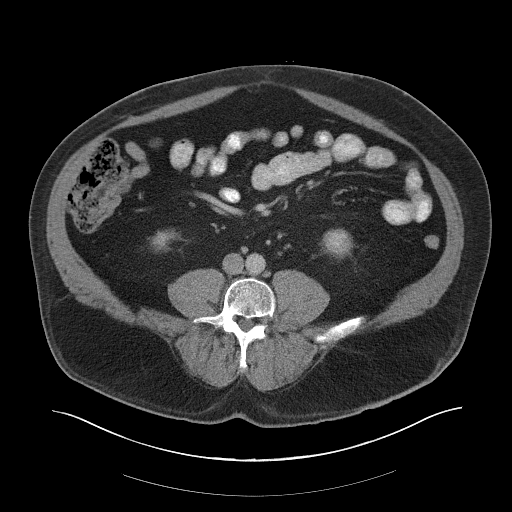
[im 17/59  soft-tissue]
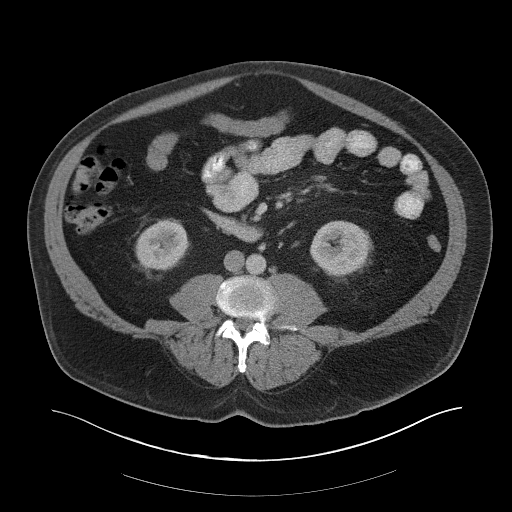
[im 21/59  soft-tissue]
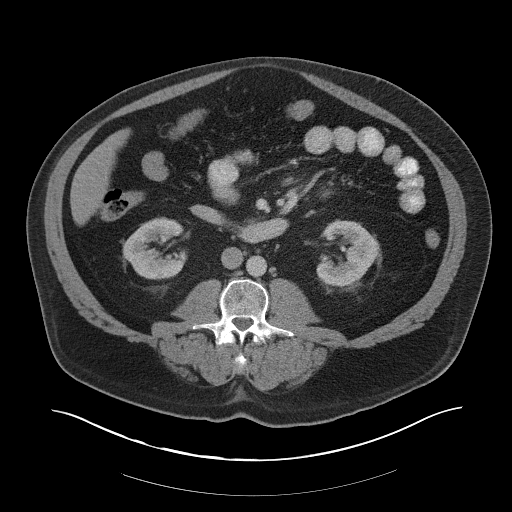
[im 25/59  soft-tissue]
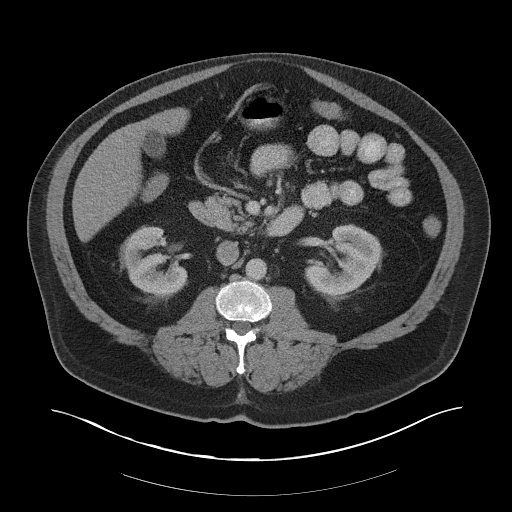
[im 34/59  soft-tissue]
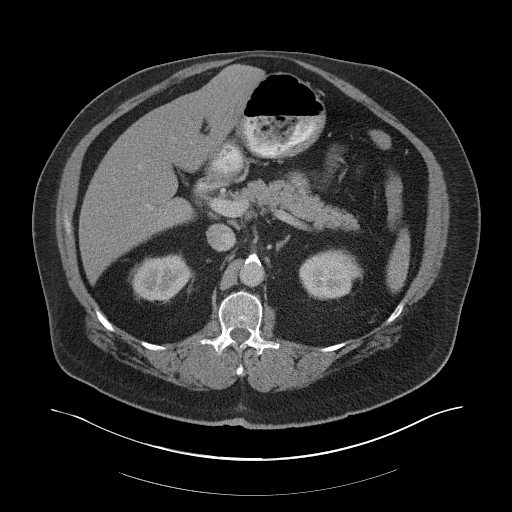
[im 38/59  soft-tissue]
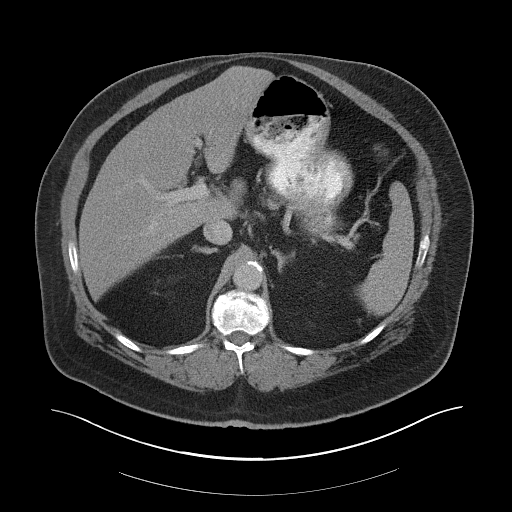
[im 42/59  soft-tissue]
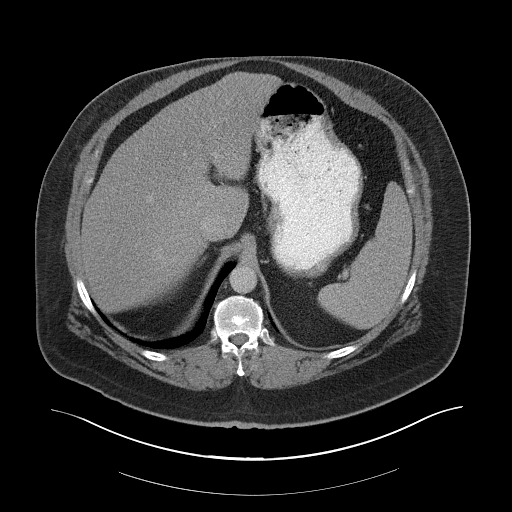
[im 42/59  bone]
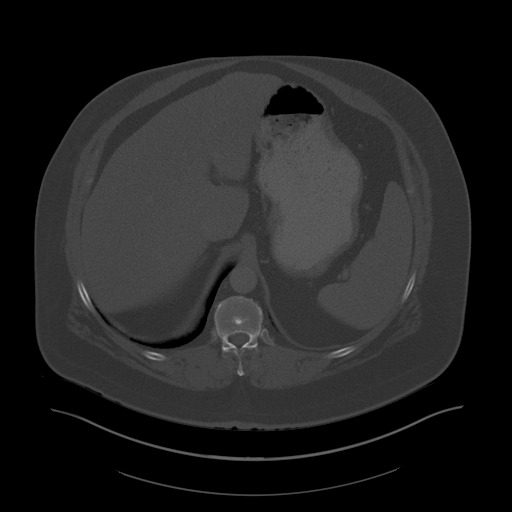
[im 46/59  soft-tissue]
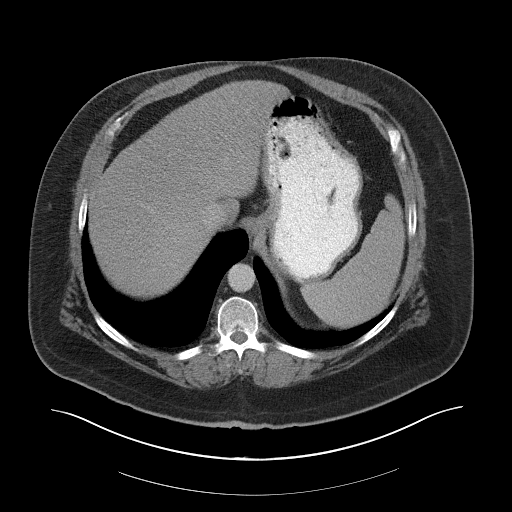
[im 50/59  soft-tissue]
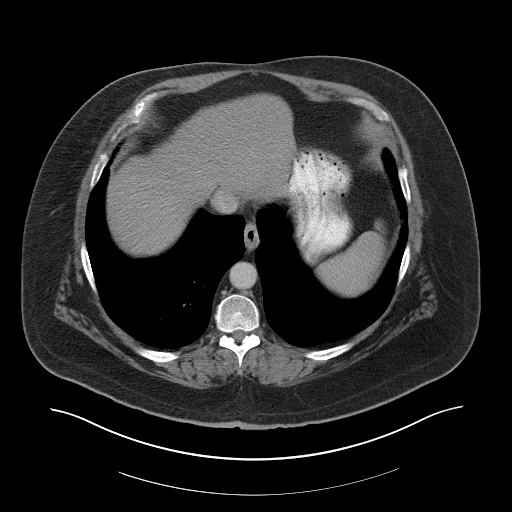
[im 54/59  soft-tissue]
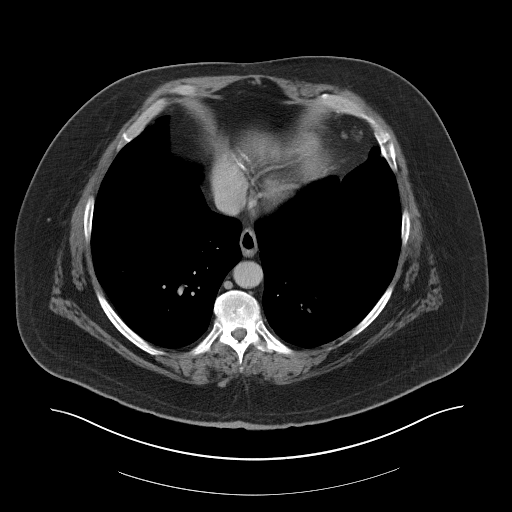

[Series 5: cor routine with · coronal · 0.60mm/px · 3 of 177 slices shown]
[im 59/177  soft-tissue]
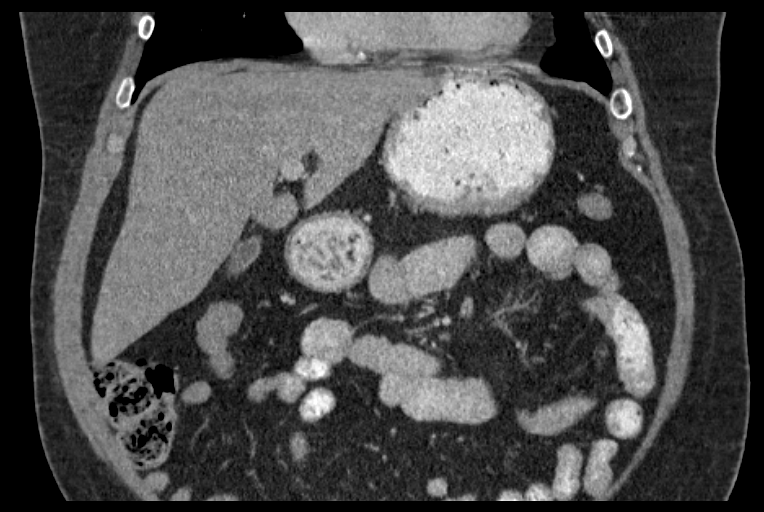
[im 79/177  soft-tissue]
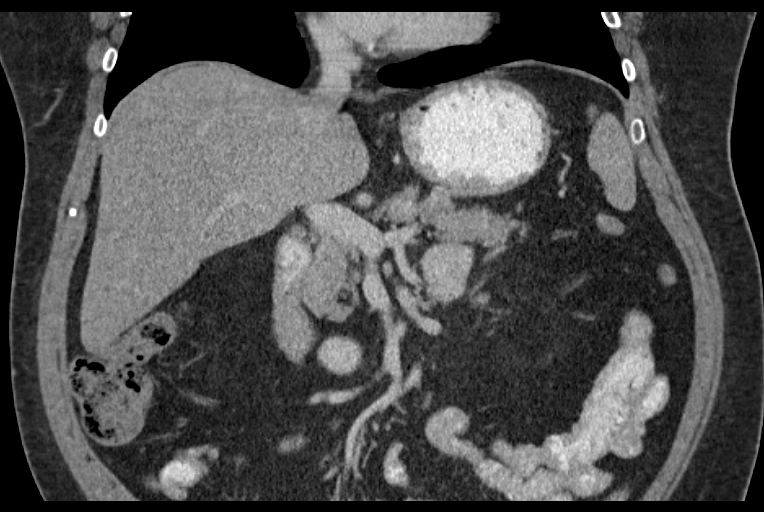
[im 98/177  soft-tissue]
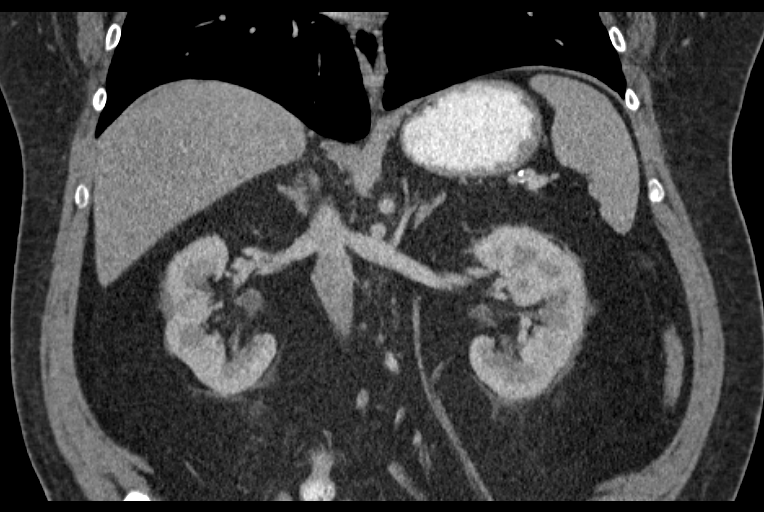

[15 of 46 positions shown; findings below may reference images not displayed]

FINDINGS: Lower chest:  Lung bases are clear.

Hepatobiliary: Liver is within normal limits.

Gallbladder is underdistended but unremarkable. No intrahepatic or
extrahepatic ductal dilatation.

Pancreas: Within normal limits.

Spleen: Within normal limits.

Adrenals/Urinary Tract: Adrenal glands are within normal limits.

Kidneys within normal limits.  No hydronephrosis.

Stomach/Bowel: Stomach is within normal limits.

Visualized bowel is unremarkable.

Vascular/Lymphatic: No evidence of abdominal aortic aneurysm. Mild
atherosclerotic calcifications of the abdominal aorta.

No suspicious abdominal lymphadenopathy. Small subcentimeter lymph
nodes with mild mesenteric stranding along the jejunal mesentery,
nonspecific.

Other: No abdominal ascites.

A cutaneous marker was placed along the left anterior abdominal wall
overlying the area of clinical concern (series 2/image 28). Mild
asymmetric subcutaneous fat is present in this location, chronic
(similar to 8418). No definite mass, fluid collection, or CT
abnormality.

Musculoskeletal: Degenerative changes of the visualized
thoracolumbar spine.
IMPRESSION: Unremarkable CT abdomen.

Specifically, no subcutaneous mass, fluid collection, or CT
abnormality along the left anterior abdominal wall in the area of
clinical concern.

Please note that the pelvis was not imaged.

## 2017-01-22 DIAGNOSIS — M5489 Other dorsalgia: Secondary | ICD-10-CM | POA: Diagnosis not present

## 2017-01-22 DIAGNOSIS — M5136 Other intervertebral disc degeneration, lumbar region: Secondary | ICD-10-CM | POA: Diagnosis not present

## 2017-01-23 ENCOUNTER — Other Ambulatory Visit (HOSPITAL_COMMUNITY): Payer: Self-pay | Admitting: Student

## 2017-01-23 ENCOUNTER — Other Ambulatory Visit: Payer: Self-pay | Admitting: Student

## 2017-01-23 DIAGNOSIS — M5489 Other dorsalgia: Secondary | ICD-10-CM

## 2017-01-23 NOTE — Telephone Encounter (Signed)
Patients torsemide has been filled previously by Barrington Ellison, PA at the [facility]. The rx request was sent to his office from the pharmacy but they routed it here. Patient has seen Dr Burt Knack and also follows with Dr Ubaldo Glassing cardiologist at [facility]. Okay to refill? Please advise. Thanks, MI

## 2017-01-27 ENCOUNTER — Ambulatory Visit: Admission: RE | Admit: 2017-01-27 | Payer: Medicare HMO | Source: Ambulatory Visit

## 2017-02-05 DIAGNOSIS — E1143 Type 2 diabetes mellitus with diabetic autonomic (poly)neuropathy: Secondary | ICD-10-CM | POA: Diagnosis not present

## 2017-02-05 DIAGNOSIS — Z0000 Encounter for general adult medical examination without abnormal findings: Secondary | ICD-10-CM | POA: Diagnosis not present

## 2017-02-05 DIAGNOSIS — E7800 Pure hypercholesterolemia, unspecified: Secondary | ICD-10-CM | POA: Diagnosis not present

## 2017-02-05 DIAGNOSIS — I480 Paroxysmal atrial fibrillation: Secondary | ICD-10-CM | POA: Diagnosis not present

## 2017-02-05 DIAGNOSIS — M2550 Pain in unspecified joint: Secondary | ICD-10-CM | POA: Diagnosis not present

## 2017-02-05 DIAGNOSIS — I10 Essential (primary) hypertension: Secondary | ICD-10-CM | POA: Diagnosis not present

## 2017-02-05 DIAGNOSIS — Z79899 Other long term (current) drug therapy: Secondary | ICD-10-CM | POA: Diagnosis not present

## 2017-02-05 DIAGNOSIS — Z125 Encounter for screening for malignant neoplasm of prostate: Secondary | ICD-10-CM | POA: Diagnosis not present

## 2017-02-05 DIAGNOSIS — I25810 Atherosclerosis of coronary artery bypass graft(s) without angina pectoris: Secondary | ICD-10-CM | POA: Diagnosis not present

## 2017-02-12 ENCOUNTER — Ambulatory Visit
Admission: RE | Admit: 2017-02-12 | Discharge: 2017-02-12 | Disposition: A | Discharge status: Home / Self Care | Payer: Medicare HMO | Source: Ambulatory Visit | Attending: Student | Admitting: Student

## 2017-02-12 DIAGNOSIS — M5489 Other dorsalgia: Secondary | ICD-10-CM | POA: Diagnosis present

## 2017-02-12 DIAGNOSIS — M545 Low back pain: Secondary | ICD-10-CM | POA: Diagnosis not present

## 2017-02-12 DIAGNOSIS — M5136 Other intervertebral disc degeneration, lumbar region: Secondary | ICD-10-CM | POA: Diagnosis not present

## 2017-02-12 DIAGNOSIS — M48061 Spinal stenosis, lumbar region without neurogenic claudication: Secondary | ICD-10-CM | POA: Insufficient documentation

## 2017-02-18 DIAGNOSIS — L97512 Non-pressure chronic ulcer of other part of right foot with fat layer exposed: Secondary | ICD-10-CM | POA: Diagnosis not present

## 2017-02-21 ENCOUNTER — Emergency Department
Admission: EM | Admit: 2017-02-21 | Discharge: 2017-02-21 | Disposition: A | Discharge status: Home / Self Care | Payer: Medicare HMO | Attending: Emergency Medicine | Admitting: Emergency Medicine

## 2017-02-21 ENCOUNTER — Emergency Department: Payer: Medicare HMO

## 2017-02-21 DIAGNOSIS — Z79899 Other long term (current) drug therapy: Secondary | ICD-10-CM | POA: Insufficient documentation

## 2017-02-21 DIAGNOSIS — J449 Chronic obstructive pulmonary disease, unspecified: Secondary | ICD-10-CM | POA: Diagnosis not present

## 2017-02-21 DIAGNOSIS — I4891 Unspecified atrial fibrillation: Secondary | ICD-10-CM | POA: Diagnosis not present

## 2017-02-21 DIAGNOSIS — I5042 Chronic combined systolic (congestive) and diastolic (congestive) heart failure: Secondary | ICD-10-CM | POA: Insufficient documentation

## 2017-02-21 DIAGNOSIS — Z7982 Long term (current) use of aspirin: Secondary | ICD-10-CM | POA: Diagnosis not present

## 2017-02-21 DIAGNOSIS — I110 Hypertensive heart disease with heart failure: Secondary | ICD-10-CM | POA: Diagnosis not present

## 2017-02-21 DIAGNOSIS — E119 Type 2 diabetes mellitus without complications: Secondary | ICD-10-CM | POA: Insufficient documentation

## 2017-02-21 DIAGNOSIS — Z794 Long term (current) use of insulin: Secondary | ICD-10-CM | POA: Diagnosis not present

## 2017-02-21 DIAGNOSIS — Z951 Presence of aortocoronary bypass graft: Secondary | ICD-10-CM | POA: Insufficient documentation

## 2017-02-21 DIAGNOSIS — I2510 Atherosclerotic heart disease of native coronary artery without angina pectoris: Secondary | ICD-10-CM | POA: Diagnosis not present

## 2017-02-21 DIAGNOSIS — R079 Chest pain, unspecified: Secondary | ICD-10-CM

## 2017-02-21 LAB — CBC
HEMATOCRIT: 39.6 % — AB (ref 40.0–52.0)
Hemoglobin: 14.1 g/dL (ref 13.0–18.0)
MCH: 31.7 pg (ref 26.0–34.0)
MCHC: 35.6 g/dL (ref 32.0–36.0)
MCV: 89 fL (ref 80.0–100.0)
Platelets: 169 10*3/uL (ref 150–440)
RBC: 4.45 MIL/uL (ref 4.40–5.90)
RDW: 13.7 % (ref 11.5–14.5)
WBC: 7.4 10*3/uL (ref 3.8–10.6)

## 2017-02-21 LAB — BASIC METABOLIC PANEL
ANION GAP: 10 (ref 5–15)
BUN: 27 mg/dL — AB (ref 6–20)
CHLORIDE: 106 mmol/L (ref 101–111)
CO2: 23 mmol/L (ref 22–32)
Calcium: 9.1 mg/dL (ref 8.9–10.3)
Creatinine, Ser: 1.28 mg/dL — ABNORMAL HIGH (ref 0.61–1.24)
GFR calc Af Amer: >60 mL/min (ref >60)
GFR calc non Af Amer: 59 mL/min — ABNORMAL LOW (ref >60)
Glucose, Bld: 219 mg/dL — ABNORMAL HIGH (ref 65–99)
POTASSIUM: 4.1 mmol/L (ref 3.5–5.1)
SODIUM: 139 mmol/L (ref 135–145)

## 2017-02-21 LAB — TROPONIN I: Troponin I: <0.03 ng/mL (ref <0.03)

## 2017-02-21 MED ORDER — MORPHINE SULFATE (PF) 4 MG/ML IV SOLN
4.0000 mg | Freq: Once | INTRAVENOUS | Status: AC
  Administered 2017-02-21: 4 mg via INTRAVENOUS
  Filled 2017-02-21: qty 1

## 2017-02-21 MED ORDER — NITROGLYCERIN 0.4 MG SL SUBL
0.4000 mg | SUBLINGUAL_TABLET | SUBLINGUAL | Status: DC | PRN
  Administered 2017-02-21 (×3): 0.4 mg via SUBLINGUAL
  Filled 2017-02-21: qty 1

## 2017-02-21 NOTE — ED Provider Notes (Signed)
-----------------------------------------   3:43 PM on 02/21/2017 -----------------------------------------   Blood pressure 128/66, pulse (!) 53, temperature 98 F (36.7 C), resp. rate 15, height 5\' 11"  (1.803 m), weight 128.4 kg (283 lb), SpO2 94 %.  Assuming care from Dr. Cherylann Banas of [facility] is a 61 y.o. male with a chief complaint of Chest Pain .    Please refer to H&P by previous MD for further details.  The current plan of care is to f/u 2nd troponin.  _________________________ 5:13 PM on 02/21/2017 -----------------------------------------  Patient remains well appearing. Troponin x 2 negative. EKG x 2 negative. patient instructed to follow-up with his cardiologist in the next 2 days or return to the emergency room if he develops new or worsening chest pain, dizziness, or shortness of breath. Patient is comfortable with this plan.Alfred Levins, [place], MD 02/21/17 [phone]

## 2017-02-21 NOTE — ED Provider Notes (Signed)
[facility] Emergency Department Provider Note ____________________________________________   First MD Initiated Contact with Patient 02/21/17 1323     (approximate)  I have reviewed the triage vital signs and the nursing notes.   HISTORY  Chief Complaint Chest Pain    HPI LEWIN PELLOW is a 61 y.o. male wth history of CAD status post CABG, A. fib, diabetes, hypertension who presents with chest pain.  Patient states he awoke with the pain this morning, acute onset, bilateral across his chest, described as a squeezing or pressure, associated with shortness of breath and it is exertional. Patient also states that both of his arms feel heavy and that he has tingling in the left arm. Patient denies nausea or vomiting, cough or fever, or abdominal pain. No other weakness or numbness in the extremities and no headache.  No acute leg pain or swelling.   Past Medical History:  Diagnosis Date  . A-fib ([place])   . Aortic stenosis, moderate 11/2015  . Arthritis   . CAD (coronary artery disease)   . Chronic systolic CHF (congestive heart failure) ([place])   . Diabetes mellitus without complication ([place])    INSULIN DEPENDENT  . GERD (gastroesophageal reflux disease)   . History of cardioversion   . Hypertension   . MI (myocardial infarction) ([place])   . OSA on CPAP   . Sepsis ([place])   . Shortness of breath dyspnea   . SVT (supraventricular tachycardia) [facility])     Patient Active Problem List   Diagnosis Date Noted  . Aortic stenosis 11/21/2015  . Acute on chronic diastolic CHF (congestive heart failure), NYHA class 3 ([place]) 11/21/2015  . Aortic valve, bicuspid 09/24/2015  . Cellulitis 05/04/2015  . Acute respiratory failure with hypoxia ([place]) 03/26/2015  . CHF (congestive heart failure) ([place]) 01/15/2015  . HTN (hypertension) 01/14/2015  . Type II diabetes mellitus ([place]) 01/14/2015  . COPD (chronic obstructive pulmonary disease) ([place]) 01/14/2015  . OSA on CPAP  01/14/2015  . CAD (coronary artery disease) 01/14/2015  . A-fib ([place]) 01/14/2015  . Pain of left calf 01/14/2015  . Acute on chronic systolic CHF (congestive heart failure) ([place]) 01/14/2015  . COPD exacerbation ([place]) 12/22/2014  . Diabetes ([place]) 10/14/2014    Past Surgical History:  Procedure Laterality Date  . 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 BUNIONECTOMY    . CARDIAC CATHETERIZATION N/A 10/02/2015   Procedure: Coronary/[place] Angiography;  Surgeon: Teodoro Spray, MD;  Location: [place] [facility];  Service: Cardiovascular;  Laterality: N/A;  . CARDIAC CATHETERIZATION N/A 11/21/2015   Procedure: Right and Left Heart Cath;  Surgeon: Sherren Mocha, MD;  Location: [place] [facility];  Service: Cardiovascular;  Laterality: N/A;  . CORONARY ARTERY BYPASS GRAFT    . KNEE SURGERY    . quadruple bypass    . TEE WITHOUT CARDIOVERSION  11/21/2015  . TEE WITHOUT CARDIOVERSION N/A 11/21/2015   Procedure: TRANSESOPHAGEAL ECHOCARDIOGRAM (TEE);  Surgeon: Larey Dresser, MD;  Location: [place];  Service: Cardiovascular;  Laterality: N/A;    Prior to Admission medications   Medication Sig Start Date End Date Taking? Authorizing Provider  albuterol (PROVENTIL HFA;VENTOLIN HFA) 108 (90 Base) MCG/ACT inhaler Inhale 2 puffs into the lungs every 6 (six) hours as needed for wheezing or shortness of breath. 07/11/15   Flora Lipps, MD  aspirin 81 MG EC tablet Take 1  tablet (81 mg total) by mouth daily. 03/10/16   Sherren Mocha, MD  diltiazem (CARDIZEM CD) 300 MG 24 hr capsule Take 1 capsule (300 mg total) by mouth daily. 10/15/14   Idelle Crouch, MD  docusate sodium (COLACE) 100 MG capsule Take 100 mg by mouth 2 (two) times daily.    [provider]  ferrous sulfate 325 (65 FE) MG tablet Take 1 tablet (325 mg total) by mouth daily with breakfast. 05/16/16   Clegg, Amy D, NP    gabapentin (NEURONTIN) 300 MG capsule Take 300 mg by mouth 2 (two) times daily.    [provider]  insulin NPH Human (HUMULIN N,NOVOLIN N) 100 UNIT/ML injection Inject 60 Units into the skin 2 (two) times daily.     [provider]  lisinopril (PRINIVIL,ZESTRIL) 20 MG tablet Take 20 mg by mouth daily.    [provider]  meloxicam (MOBIC) 15 MG tablet Take 15 mg by mouth daily.    [provider]  metFORMIN (GLUCOPHAGE) 1000 MG tablet Take 1,000 mg by mouth 2 (two) times daily with a meal.    [provider]  metoprolol (LOPRESSOR) 100 MG tablet Take 100 mg by mouth 2 (two) times daily. 10/15/15   [provider]  nitroGLYCERIN (NITROSTAT) 0.4 MG SL tablet Place 0.4 mg under the tongue every 5 (five) minutes as needed for chest pain.    [provider]  pantoprazole (PROTONIX) 40 MG tablet Take 40 mg by mouth daily.    [provider]  potassium chloride 20 MEQ TBCR Take 20 mEq by mouth daily. 11/23/15   Shirley Friar, PA-C  simvastatin (ZOCOR) 40 MG tablet Take 40 mg by mouth at bedtime.     [provider]  torsemide (DEMADEX) 20 MG tablet TAKE 3 TABLETS (60 MG TOTAL) BY MOUTH DAILY. 07/16/16   Shirley Friar, PA-C  traZODone (DESYREL) 100 MG tablet Take 100 mg by mouth at bedtime.    [provider]  umeclidinium-vilanterol (ANORO ELLIPTA) 62.5-25 MCG/INH AEPB Inhale 1 puff into the lungs daily. 10/02/16   Flora Lipps, MD    Allergies Patient has no known allergies.  Family History  Problem Relation Age of Onset  . Stroke Mother   . Heart attack Mother   . Hypertension Mother   . Heart attack Father   . Hypertension Father   . Heart attack Brother        #1  . Diabetes Brother        #1  . Heart disease Brother        #2  . Lung disease Brother        #2  . Hypertension Brother        #2  . Diabetes Brother        #2    Social History Social History  Substance Use  Topics  . Smoking status: Never Smoker  . Smokeless tobacco: Never Used  . Alcohol use No    Review of Systems  Constitutional: No fever/chills Eyes: No visual changes. ENT: No sore throat. Cardiovascular: positive for chest pain. Respiratory: positive for shortness of breath Gastrointestinal: No nausea, no vomiting.  No diarrhea.  Genitourinary: Negative for dysuria.  Musculoskeletal: Negative for back pain. Skin: Negative for rash. Neurological: Negative for headaches or focal weakness, positive for left arm tingling   ____________________________________________   PHYSICAL EXAM:  VITAL SIGNS: ED Triage Vitals  Enc Vitals Group     BP 02/21/17 1248 [place] Kitchen)  133/50     Pulse Rate 02/21/17 1248 (!) 55     Resp 02/21/17 1248 16     Temp 02/21/17 1248 98 F (36.7 C)     Temp src --      SpO2 02/21/17 1248 97 %     Weight 02/21/17 1249 283 lb (128.4 kg)     Height 02/21/17 1249 5\' 11"  (1.803 m)     Head Circumference --      Peak Flow --      Pain Score --      Pain Loc --      Pain Edu? --      Excl. in [place]? --     Constitutional: Alert and oriented. Uncomfortable appearing, in no acute distress. Eyes: Conjunctivae are normal.  Head: Atraumatic. Nose: No congestion/rhinnorhea. Mouth/Throat: Mucous membranes are moist.   Neck: Normal range of motion.  Cardiovascular: Normal rate, regular rhythm. Grossly normal heart sounds.  Good peripheral circulation. Respiratory: Normal respiratory effort.  No retractions. Lungs CTAB. Gastrointestinal: Soft and nontender. No distention.  Genitourinary: No CVA tenderness. Musculoskeletal: Trace pitting edema to bilat LEs.  Extremities warm and well perfused.  Neurologic:  Normal speech and language. No gross focal neurologic deficits are appreciated. Motor intact in all extremities, no focal sensory deficits in extremities, cranial nerves III through XII intact, normal coordination and finger-to-nose. Skin:  Skin is warm and dry. No  rash noted. Psychiatric: Mood and affect are normal. Speech and behavior are normal.  ____________________________________________   LABS (all labs ordered are listed, but only abnormal results are displayed)  Labs Reviewed  BASIC METABOLIC PANEL - Abnormal; Notable for the following:       Result Value   Glucose, Bld 219 (*)    BUN 27 (*)    Creatinine, Ser 1.28 (*)    GFR calc non Af Amer 59 (*)    All other components within normal limits  CBC - Abnormal; Notable for the following:    HCT 39.6 (*)    All other components within normal limits  TROPONIN I  TROPONIN I   ____________________________________________  EKG  ED ECG REPORT I, Arta Silence, the attending physician, personally viewed and interpreted this ECG.  Date: 02/21/2017 EKG Time: 1241 Rate: 56 Rhythm: normal sinus rhythm QRS Axis: normal Intervals: incomplete right bundle-branch block ST/T Wave abnormalities: LVH, with repolarization abnormality Narrative Interpretation: no evidence of acute ischemia   ED ECG REPORT I, Arta Silence, the attending physician, personally viewed and interpreted this ECG.  Date: 02/21/2017 EKG Time: 15:43 Rate: 53 Rhythm: normal sinus rhythm QRS Axis: normal Intervals: incomplete right bundle-branch block ST/T Wave abnormalities: LVH, with repolarization abnormality Narrative Interpretation: no evidence of acute ischemia  ____________________________________________  RADIOLOGY  Chest x-ray negative for active cardiopulmonary disease  ____________________________________________   PROCEDURES  Procedure(s) performed: No    Critical Care performed: No ____________________________________________   INITIAL IMPRESSION / ASSESSMENT AND PLAN / ED COURSE  Pertinent labs & imaging results that were available during my care of the patient were reviewed by me and considered in my medical decision making (see chart for details).  61 year old male  history CAD status post CABG presents with chest pain since this morning which is exertional and associated with shortness of breath.  Also with bilateral arm heaviness and left arm tingling.  vital signs are normal, patient is uncomfortable but not acutely ill appearing, his exam is as described and the EKG is nonischemic.  Overall presentation is most  likely exacerbation of his chronic recurrent chest pain for which he has presented multiple times previously, but he is at inc risk for ACS given his CAD history and the exertional and pressure-like nature of the pain is somewhat concerning. Also consider musculoskeletal pain given that it is somewhat reproducible, or GERD.  Patient has no PE risk factors and no signs or symptoms to suggest DVT. His vital signs do not support diagnosis of PE. I am also very low suspicion for any aortic cause given patient's prior history of episodes similar to this, no radiation to the back, and normal vital signs.  Plan: basic labs, cardiac enzymes, symptomatic treatment with nitroglycerin and morphine, and I will discuss patient's case with his cardiologist. Per our records, patient last had a stress test and echo in 2017 - will plan with his cardiologist whether admission is indicated based on pt's presentation and workup.     ----------------------------------------- 3:22 PM on 02/21/2017 -----------------------------------------  Patient's pain was unchanged with nitroglycerin, but then improved after morphine. Patient states that he is now much more comfortable. He still is anxious, and is expressing concern that he would like to be admitted because of stressors at home - states he would like to take a break.  Initial troponin is negative and labs are otherwise unremarkable.  I discussed the case with patient's cardiologist Dr. Ubaldo Glassing, who recommended that given patient's chronic recurrent chest pain and repeated negative workups that he did not recommend admitting the  patient for emergent testing unless there were objective findings on EKG or cardiac enzymes.  Plan for repeat 3 hour troponin and EKG. I discussed this with the patient who states he feels well to go home if these end up being negative.  Dr. Ubaldo Glassing recommended that patient follow up with him next week.   ----------------------------------------- 4:13 PM on 02/21/2017 -----------------------------------------  Patient signed out to Dr. Alfred Levins who will follow up on the troponin and reassess.    ____________________________________________   FINAL CLINICAL IMPRESSION(S) / ED DIAGNOSES  Final diagnoses:  Nonspecific chest pain      NEW MEDICATIONS STARTED DURING THIS VISIT:  New Prescriptions   No medications on file     Note:  This document was prepared using Dragon voice recognition software and may include unintentional dictation errors.    Arta Silence, MD 02/21/17 [phone]

## 2017-02-21 NOTE — Discharge Instructions (Signed)
Return to the ER for any new or worsening chest pain, worsening shortness of breath, cough or fever, weakness, or any other new or worsening symptoms, or change in your chest pain pattern that concerns you. Call Dr. Ubaldo Glassing to arrange followup within the next week

## 2017-02-21 NOTE — ED Triage Notes (Signed)
Pt came to ED via pov c/o bilateral arm numbness w/chest pain starting this morning. History of MI. Vs stable. Denies n/v.

## 2017-02-22 ENCOUNTER — Other Ambulatory Visit (HOSPITAL_COMMUNITY): Payer: Self-pay | Admitting: Adult Health

## 2017-02-26 DIAGNOSIS — Z9989 Dependence on other enabling machines and devices: Secondary | ICD-10-CM | POA: Diagnosis not present

## 2017-02-26 DIAGNOSIS — E7800 Pure hypercholesterolemia, unspecified: Secondary | ICD-10-CM | POA: Diagnosis not present

## 2017-02-26 DIAGNOSIS — I10 Essential (primary) hypertension: Secondary | ICD-10-CM | POA: Diagnosis not present

## 2017-02-26 DIAGNOSIS — I5033 Acute on chronic diastolic (congestive) heart failure: Secondary | ICD-10-CM | POA: Diagnosis not present

## 2017-02-26 DIAGNOSIS — G4733 Obstructive sleep apnea (adult) (pediatric): Secondary | ICD-10-CM | POA: Diagnosis not present

## 2017-02-26 DIAGNOSIS — I350 Nonrheumatic aortic (valve) stenosis: Secondary | ICD-10-CM | POA: Diagnosis not present

## 2017-02-26 DIAGNOSIS — I480 Paroxysmal atrial fibrillation: Secondary | ICD-10-CM | POA: Diagnosis not present

## 2017-02-26 DIAGNOSIS — I25810 Atherosclerosis of coronary artery bypass graft(s) without angina pectoris: Secondary | ICD-10-CM | POA: Diagnosis not present

## 2017-02-28 ENCOUNTER — Emergency Department: Payer: Medicare HMO

## 2017-02-28 ENCOUNTER — Encounter: Payer: Self-pay | Admitting: Emergency Medicine

## 2017-02-28 ENCOUNTER — Observation Stay
Admission: EM | Admit: 2017-02-28 | Discharge: 2017-03-01 | Disposition: A | Discharge status: Home / Self Care | Payer: Medicare HMO | Attending: Internal Medicine | Admitting: Internal Medicine

## 2017-02-28 DIAGNOSIS — R51 Headache: Secondary | ICD-10-CM

## 2017-02-28 DIAGNOSIS — I5043 Acute on chronic combined systolic (congestive) and diastolic (congestive) heart failure: Secondary | ICD-10-CM | POA: Insufficient documentation

## 2017-02-28 DIAGNOSIS — R29818 Other symptoms and signs involving the nervous system: Secondary | ICD-10-CM | POA: Diagnosis not present

## 2017-02-28 DIAGNOSIS — Z7901 Long term (current) use of anticoagulants: Secondary | ICD-10-CM | POA: Diagnosis not present

## 2017-02-28 DIAGNOSIS — G459 Transient cerebral ischemic attack, unspecified: Principal | ICD-10-CM | POA: Diagnosis present

## 2017-02-28 DIAGNOSIS — K219 Gastro-esophageal reflux disease without esophagitis: Secondary | ICD-10-CM | POA: Diagnosis not present

## 2017-02-28 DIAGNOSIS — J449 Chronic obstructive pulmonary disease, unspecified: Secondary | ICD-10-CM | POA: Insufficient documentation

## 2017-02-28 DIAGNOSIS — Z794 Long term (current) use of insulin: Secondary | ICD-10-CM | POA: Diagnosis not present

## 2017-02-28 DIAGNOSIS — R059 Cough, unspecified: Secondary | ICD-10-CM

## 2017-02-28 DIAGNOSIS — Z951 Presence of aortocoronary bypass graft: Secondary | ICD-10-CM | POA: Diagnosis not present

## 2017-02-28 DIAGNOSIS — R42 Dizziness and giddiness: Secondary | ICD-10-CM | POA: Diagnosis not present

## 2017-02-28 DIAGNOSIS — I110 Hypertensive heart disease with heart failure: Secondary | ICD-10-CM | POA: Insufficient documentation

## 2017-02-28 DIAGNOSIS — R404 Transient alteration of awareness: Secondary | ICD-10-CM | POA: Diagnosis not present

## 2017-02-28 DIAGNOSIS — Z79899 Other long term (current) drug therapy: Secondary | ICD-10-CM | POA: Insufficient documentation

## 2017-02-28 DIAGNOSIS — I480 Paroxysmal atrial fibrillation: Secondary | ICD-10-CM | POA: Insufficient documentation

## 2017-02-28 DIAGNOSIS — G4733 Obstructive sleep apnea (adult) (pediatric): Secondary | ICD-10-CM | POA: Insufficient documentation

## 2017-02-28 DIAGNOSIS — R519 Headache, unspecified: Secondary | ICD-10-CM

## 2017-02-28 DIAGNOSIS — I471 Supraventricular tachycardia: Secondary | ICD-10-CM | POA: Insufficient documentation

## 2017-02-28 DIAGNOSIS — E669 Obesity, unspecified: Secondary | ICD-10-CM | POA: Diagnosis not present

## 2017-02-28 DIAGNOSIS — I2510 Atherosclerotic heart disease of native coronary artery without angina pectoris: Secondary | ICD-10-CM | POA: Insufficient documentation

## 2017-02-28 DIAGNOSIS — I350 Nonrheumatic aortic (valve) stenosis: Secondary | ICD-10-CM | POA: Insufficient documentation

## 2017-02-28 DIAGNOSIS — Z6841 Body Mass Index (BMI) 40.0 and over, adult: Secondary | ICD-10-CM | POA: Diagnosis not present

## 2017-02-28 DIAGNOSIS — E119 Type 2 diabetes mellitus without complications: Secondary | ICD-10-CM | POA: Diagnosis not present

## 2017-02-28 DIAGNOSIS — R05 Cough: Secondary | ICD-10-CM

## 2017-02-28 DIAGNOSIS — I252 Old myocardial infarction: Secondary | ICD-10-CM | POA: Insufficient documentation

## 2017-02-28 DIAGNOSIS — Z7982 Long term (current) use of aspirin: Secondary | ICD-10-CM | POA: Diagnosis not present

## 2017-02-28 DIAGNOSIS — I6523 Occlusion and stenosis of bilateral carotid arteries: Secondary | ICD-10-CM | POA: Diagnosis not present

## 2017-02-28 LAB — CBC
HCT: 37.8 % — ABNORMAL LOW (ref 40.0–52.0)
Hemoglobin: 13.3 g/dL (ref 13.0–18.0)
MCH: 31.6 pg (ref 26.0–34.0)
MCHC: 35.3 g/dL (ref 32.0–36.0)
MCV: 89.7 fL (ref 80.0–100.0)
PLATELETS: 142 10*3/uL — AB (ref 150–440)
RBC: 4.21 MIL/uL — AB (ref 4.40–5.90)
RDW: 13.7 % (ref 11.5–14.5)
WBC: 7.7 10*3/uL (ref 3.8–10.6)

## 2017-02-28 LAB — DIFFERENTIAL
Basophils Absolute: 0.1 10*3/uL (ref 0–0.1)
Basophils Relative: 1 %
EOS ABS: 0.5 10*3/uL (ref 0–0.7)
EOS PCT: 7 %
LYMPHS ABS: 1.4 10*3/uL (ref 1.0–3.6)
LYMPHS PCT: 18 %
MONO ABS: 0.6 10*3/uL (ref 0.2–1.0)
Monocytes Relative: 8 %
Neutro Abs: 5 10*3/uL (ref 1.4–6.5)
Neutrophils Relative %: 66 %

## 2017-02-28 LAB — URINALYSIS, ROUTINE W REFLEX MICROSCOPIC
BILIRUBIN URINE: NEGATIVE
Bacteria, UA: NONE SEEN
Hgb urine dipstick: NEGATIVE
KETONES UR: NEGATIVE mg/dL
LEUKOCYTES UA: NEGATIVE
NITRITE: NEGATIVE
PH: 5 (ref 5.0–8.0)
Protein, ur: NEGATIVE mg/dL
RBC / HPF: NONE SEEN RBC/hpf (ref 0–5)
SPECIFIC GRAVITY, URINE: 1.018 (ref 1.005–1.030)

## 2017-02-28 LAB — BASIC METABOLIC PANEL
Anion gap: 9 (ref 5–15)
BUN: 28 mg/dL — AB (ref 6–20)
CALCIUM: 8.8 mg/dL — AB (ref 8.9–10.3)
CHLORIDE: 110 mmol/L (ref 101–111)
CO2: 21 mmol/L — AB (ref 22–32)
CREATININE: 1.17 mg/dL (ref 0.61–1.24)
GFR calc Af Amer: >60 mL/min (ref >60)
GFR calc non Af Amer: >60 mL/min (ref >60)
Glucose, Bld: 165 mg/dL — ABNORMAL HIGH (ref 65–99)
Potassium: 4.2 mmol/L (ref 3.5–5.1)
SODIUM: 140 mmol/L (ref 135–145)

## 2017-02-28 LAB — PROTIME-INR
INR: 0.96
PROTHROMBIN TIME: 12.7 s (ref 11.4–15.2)

## 2017-02-28 LAB — TROPONIN I: Troponin I: <0.03 ng/mL (ref <0.03)

## 2017-02-28 LAB — ETHANOL: Alcohol, Ethyl (B): <5 mg/dL (ref <5)

## 2017-02-28 LAB — URINE DRUG SCREEN, QUALITATIVE (ARMC ONLY)
Amphetamines, Ur Screen: NOT DETECTED
BARBITURATES, UR SCREEN: NOT DETECTED
Benzodiazepine, Ur Scrn: NOT DETECTED
Cannabinoid 50 Ng, Ur ~~LOC~~: NOT DETECTED
Cocaine Metabolite,Ur ~~LOC~~: NOT DETECTED
MDMA (Ecstasy)Ur Screen: NOT DETECTED
METHADONE SCREEN, URINE: NOT DETECTED
OPIATE, UR SCREEN: POSITIVE — AB
Phencyclidine (PCP) Ur S: NOT DETECTED
TRICYCLIC, UR SCREEN: NOT DETECTED

## 2017-02-28 LAB — APTT: APTT: 27 s (ref 24–36)

## 2017-02-28 MED ORDER — ONDANSETRON HCL 4 MG/2ML IJ SOLN
4.0000 mg | Freq: Once | INTRAMUSCULAR | Status: AC
  Administered 2017-02-28: 4 mg via INTRAVENOUS
  Filled 2017-02-28: qty 2

## 2017-02-28 MED ORDER — MORPHINE SULFATE (PF) 4 MG/ML IV SOLN
4.0000 mg | Freq: Once | INTRAVENOUS | Status: AC
  Administered 2017-02-28: 4 mg via INTRAVENOUS
  Filled 2017-02-28: qty 1

## 2017-02-28 MED ORDER — ASPIRIN 81 MG PO CHEW
324.0000 mg | CHEWABLE_TABLET | Freq: Once | ORAL | Status: AC
  Administered 2017-02-28: 324 mg via ORAL
  Filled 2017-02-28: qty 4

## 2017-02-28 MED ORDER — IOPAMIDOL (ISOVUE-370) INJECTION 76%
75.0000 mL | Freq: Once | INTRAVENOUS | Status: AC | PRN
  Administered 2017-02-28: 75 mL via INTRAVENOUS

## 2017-02-28 NOTE — ED Triage Notes (Signed)
Pt in via [facility]; states he went to the [employer] for a fish fry, reports sudden onset dizziness approximately an hour ago, complaints of headache w/ nausea/vomiting.  Pt A/Ox4.  MD at bedside upon arrival.  Pt transported to [place] per primary RN, Terrence Dupont at this time.

## 2017-02-28 NOTE — ED Notes (Signed)
SOC set up at bedside. 

## 2017-02-28 NOTE — ED Notes (Signed)
Pt updated on treatment plan. Pt visiting with friends in no acute distress, speech clear, moving all extremities without difficulty.

## 2017-02-28 NOTE — ED Provider Notes (Signed)
[facility] Emergency Department Provider Note  ____________________________________________   First MD Initiated Contact with Patient 02/28/17 1839     (approximate)  I have reviewed the triage vital signs and the nursing notes.   HISTORY  Chief Complaint Code Stroke  HPI Marcus Williams is a 61 y.o. male here for evaluation of a headache and dizziness  Patient reports about 30-45 minutes ago, he was eating at the local fire [place]. While he was seated he started to experience a fairly significant headache, reports it is severe and throbbing. He then began to vomit and started feeling extremely dizzy like he was spinning around.  He's noticed that he keeps his eyes closed the dizziness seems to get better, but as soon as he opens them he feels dizzy once again. He reports he occasionally gets headaches, and was recently evaluated for chest pain, but has never had a headache with severe dizziness like this before. It was abrupt in onset  denies trouble speaking. No numbness or tingling except he reports he has chronic tingling and slight weakness in his left lower leg for over a year and a half. Denies history of previous stroke. He does have a history of heart disease.  Vomited once   Past Medical History:  Diagnosis Date  . A-fib ([place])   . Aortic stenosis, moderate 11/2015  . Arthritis   . CAD (coronary artery disease)   . Chronic systolic CHF (congestive heart failure) ([place])   . Diabetes mellitus without complication ([place])    INSULIN DEPENDENT  . GERD (gastroesophageal reflux disease)   . History of cardioversion   . Hypertension   . MI (myocardial infarction) ([place])   . OSA on CPAP   . Sepsis ([place])   . Shortness of breath dyspnea   . SVT (supraventricular tachycardia) [facility])     Patient Active Problem List   Diagnosis Date Noted  . Aortic stenosis 11/21/2015  . Acute on chronic diastolic CHF (congestive heart failure), NYHA class 3 ([place])  11/21/2015  . Aortic valve, bicuspid 09/24/2015  . Cellulitis 05/04/2015  . Acute respiratory failure with hypoxia ([place]) 03/26/2015  . CHF (congestive heart failure) ([place]) 01/15/2015  . HTN (hypertension) 01/14/2015  . Type II diabetes mellitus ([place]) 01/14/2015  . COPD (chronic obstructive pulmonary disease) ([place]) 01/14/2015  . OSA on CPAP 01/14/2015  . CAD (coronary artery disease) 01/14/2015  . A-fib ([place]) 01/14/2015  . Pain of left calf 01/14/2015  . Acute on chronic systolic CHF (congestive heart failure) ([place]) 01/14/2015  . COPD exacerbation ([employer]) 12/22/2014  . Diabetes ([place]) 10/14/2014    Past Surgical History:  Procedure Laterality Date  . 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 BUNIONECTOMY    . CARDIAC CATHETERIZATION N/A 10/02/2015   Procedure: Coronary/Grafts Angiography;  Surgeon: Teodoro Spray, MD;  Location: [place] CV [facility];  Service: Cardiovascular;  Laterality: N/A;  . CARDIAC CATHETERIZATION N/A 11/21/2015   Procedure: Right and Left Heart Cath;  Surgeon: Sherren Mocha, MD;  Location: [place] [facility];  Service: Cardiovascular;  Laterality: N/A;  . CORONARY ARTERY BYPASS GRAFT    . KNEE SURGERY    . quadruple bypass    . TEE WITHOUT CARDIOVERSION  11/21/2015  . TEE WITHOUT CARDIOVERSION N/A 11/21/2015   Procedure: TRANSESOPHAGEAL ECHOCARDIOGRAM (TEE);  Surgeon: Larey Dresser, MD;  Location: [place];  Service: Cardiovascular;  Laterality: N/A;    Prior to Admission medications   Medication Sig Start Date End Date Taking? Authorizing Provider  albuterol (PROVENTIL HFA;VENTOLIN HFA) 108 (90 Base) MCG/ACT inhaler Inhale 2 puffs into the lungs every 6 (six) hours as needed for wheezing or shortness of breath. 07/11/15  Yes Kasa, Maretta Bees, MD  aspirin 81 MG EC tablet Take 1 tablet (81 mg total) by mouth daily. 03/10/16  Yes Sherren Mocha, MD    diltiazem (CARDIZEM CD) 300 MG 24 hr capsule Take 1 capsule (300 mg total) by mouth daily. 10/15/14  Yes Idelle Crouch, MD  docusate sodium (COLACE) 100 MG capsule Take 100 mg by mouth 2 (two) times daily.   Yes [provider]  ferrous sulfate 325 (65 FE) MG tablet Take 1 tablet (325 mg total) by mouth daily with breakfast. 05/16/16  Yes Clegg, Amy D, NP  gabapentin (NEURONTIN) 300 MG capsule Take 300 mg by mouth 2 (two) times daily.   Yes [provider]  insulin NPH Human (HUMULIN N,NOVOLIN N) 100 UNIT/ML injection Inject 100 Units into the skin 2 (two) times daily.    Yes [provider]  lisinopril (PRINIVIL,ZESTRIL) 20 MG tablet Take 20 mg by mouth daily.   Yes [provider]  metFORMIN (GLUCOPHAGE) 1000 MG tablet Take 1,000 mg by mouth 2 (two) times daily with a meal.   Yes [provider]  metoprolol (LOPRESSOR) 100 MG tablet Take 100 mg by mouth 2 (two) times daily. 10/15/15  Yes [provider]  nitroGLYCERIN (NITROSTAT) 0.4 MG SL tablet Place 0.4 mg under the tongue every 5 (five) minutes as needed for chest pain.   Yes [provider]  pantoprazole (PROTONIX) 40 MG tablet Take 40 mg by mouth daily.   Yes [provider]  potassium chloride 20 MEQ TBCR Take 20 mEq by mouth daily. 11/23/15  Yes Shirley Friar, PA-C  simvastatin (ZOCOR) 40 MG tablet Take 40 mg by mouth at bedtime.    Yes [provider]  torsemide (DEMADEX) 20 MG tablet TAKE 3 TABLETS (60 MG TOTAL) BY MOUTH DAILY. 07/16/16  Yes Shirley Friar, PA-C  traZODone (DESYREL) 100 MG tablet Take 100 mg by mouth at bedtime.   Yes [provider]  umeclidinium-vilanterol (ANORO ELLIPTA) 62.5-25 MCG/INH AEPB Inhale 1 puff into the lungs daily. 10/02/16  Yes Flora Lipps, MD    Allergies Patient has no known allergies.  Family History  Problem Relation Age of Onset  . Stroke Mother   . Heart attack Mother   . Hypertension  Mother   . Heart attack Father   . Hypertension Father   . Heart attack Brother        #1  . Diabetes Brother        #1  . Heart disease Brother        #2  . Lung disease Brother        #2  . Hypertension Brother        #2  . Diabetes Brother        #2    Social History Social History  Substance Use Topics  . Smoking status: Never Smoker  . Smokeless tobacco: Never Used  . Alcohol use No    Review of Systems Constitutional: No fever/chills Eyes: No visual changes. ENT: No sore throat. Cardiovascular: Denies chest pain. Respiratory: Denies shortness of breath. Gastrointestinal: No abdominal pain.   No diarrhea.  No constipation. Genitourinary: Negative for dysuria. Musculoskeletal: Negative for  back pain. Skin: Negative for rash. Neurological: Negative for focal weakness or numbness. Reports unremitting dizziness    ____________________________________________   PHYSICAL EXAM:  VITAL SIGNS: ED Triage Vitals  Enc Vitals Group     BP      Pulse      Resp      Temp      Temp src      SpO2      Weight      Height      Head Circumference      Peak Flow      Pain Score      Pain Loc      Pain Edu?      Excl. in [place]?     Constitutional: Alert and oriented. Well appearing and in no acute distress.very pleasant Eyes: Conjunctivae are normal. Head: Atraumatic. Nose: No congestion/rhinnorhea. Mouth/Throat: Mucous membranes are moist. Neck: No stridor.   Cardiovascular: Normal rate, regular rhythm. Grossly normal heart sounds.  Good peripheral circulation. Respiratory: Normal respiratory effort.  No retractions. Lungs CTAB. Gastrointestinal: Soft and nontender. No distention.morbidly obese. Musculoskeletal: No lower extremity tenderness nor edema. Neurologic:  Normal speech and language. No gross focal neurologic deficits are appreciated.   NIH score equals 2, performed by me at bedside. The patient has no pronator drift. The patient has normal cranial  nerve exam. Extraocular movements are normal. Visual fields are normal. Patient has 5 out of 5 strength in all extremitiesexcept for 4+ strength in the left lower extremity with flexion at the hip, but the patient again reports this is chronic for over a year. There is no numbness or gross, acute sensory abnormality in the extremities bilaterallyexcept for some numbness in the left lower leg, but it should be noted the patient reports this is chronic for over a year. No speech disturbance. No dysarthria. No aphasia. No ataxia. Normal finger nose finger bilat. Patient speaking in full and clear sentences.  NIH equals 2, but it should be noted that both points are warded for symptoms the patient reports have been chronic and have not occurred abruptly in the last day.   Skin:  Skin is warm, dry and intact. No rash noted. Psychiatric: Mood and affect are normal. Speech and behavior are normal.  ____________________________________________   LABS (all labs ordered are listed, but only abnormal results are displayed)  Labs Reviewed  CBC - Abnormal; Notable for the following:       Result Value   RBC 4.21 (*)    HCT 37.8 (*)    Platelets 142 (*)    All other components within normal limits  BASIC METABOLIC PANEL - Abnormal; Notable for the following:    CO2 21 (*)    Glucose, Bld 165 (*)    BUN 28 (*)    Calcium 8.8 (*)    All other components within normal limits  ETHANOL  PROTIME-INR  APTT  DIFFERENTIAL  URINE DRUG SCREEN, QUALITATIVE ([facility] ONLY)  URINALYSIS, ROUTINE W REFLEX MICROSCOPIC  TROPONIN I   ____________________________________________  EKG  reviewed and interpreted by me at [facility] Ventricular rate 55 QTc 440 QRS 110 normal sinus rhythm, possible right bundle blanch block with RSR prime observed in V1, possible LVH ____________________________________________  RADIOLOGY  Ct Head Code Stroke Wo Contrast  Result Date: 02/28/2017 CLINICAL DATA:  Code  stroke.  Dizziness EXAM: CT HEAD WITHOUT CONTRAST TECHNIQUE: Contiguous axial images were obtained from the base of the skull through the vertex without intravenous contrast. COMPARISON:  Head CT 12/21/2014 FINDINGS: Brain: No mass lesion or acute hemorrhage. No focal hypoattenuation of the basal ganglia or cortex to indicate infarcted tissue. No hydrocephalus or age advanced atrophy. Mild bilateral basal ganglia mineralization. Vascular: No hyperdense vessel. No advanced atherosclerotic calcification of the arteries at the skull base. Skull: Normal visualized skull base, calvarium and extracranial soft tissues. Sinuses/Orbits: No sinus fluid levels or advanced mucosal thickening. No mastoid effusion. Normal orbits. ASPECTS [facility] Stroke Program Early CT Score) - Ganglionic level infarction (caudate, lentiform nuclei, internal capsule, insula, M1-M3 cortex): 7 - Supraganglionic infarction (M4-M6 cortex): 3 Total score (0-10 with 10 being normal): 10 IMPRESSION: 1. No acute hemorrhage or mass lesion. 2. ASPECTS is 10. These results were called by telephone at the time of interpretation on 02/28/2017 at 6:59 pm to Dr. Delman Kitten , who verbally acknowledged these results. Electronically Signed   By: Ulyses Jarred M.D.   On: 02/28/2017 18:59    ____________________________________________   PROCEDURES  Procedure(s) performed: None  Procedures  Critical Care performed: Yes, see critical care note(s)  CRITICAL CARE Performed by: Delman Kitten   Total critical care time: 40 minutes  Critical care time was exclusive of separately billable procedures and treating other patients.  Critical care was necessary to treat or prevent imminent or life-threatening deterioration.  Critical care was time spent personally by me on the following activities: development of treatment plan with patient and/or surrogate as well as nursing, discussions with consultants, evaluation of patient's response to treatment,  examination of patient, obtaining history from patient or surrogate, ordering and performing treatments and interventions, ordering and review of laboratory studies, ordering and review of radiographic studies, pulse oximetry and re-evaluation of patient's condition.  ____________________________________________   INITIAL IMPRESSION / ASSESSMENT AND PLAN / ED COURSE  Pertinent labs & imaging results that were available during my care of the patient were reviewed by me and considered in my medical decision making (see chart for details).    Patient reports for abrupt onset of headache with associated dizziness. His stroke exam does not demonstrate any acute deficit, except he reports chronic weakness and numbness in the left lower extremity. He is concerned about the possibility of aneurysm, and he is been ordered for a code stroke with emergent CT head imaging and patella neurology consultation.  No associated cardiac or pulmonary symptoms.  Clinical Course as of Feb 28 2125  Sat Feb 28, 2017  3354 Discussed with Dr. Nicole Kindred, advises suspects peripheral vertigo. He recommends obtaining an MRI without contrast.   [MQ]  1931 spoke with Dr. Nicole Kindred. He advises the patient is not a TPA canditate, and I would agree. He recommends obtaining an MRI of the brain without contrast and CT angios negative. CT angiogram pending at this time  [MQ]    Clinical Course User Index [MQ] Delman Kitten, MD    ----------------------------------------- 9:23 PM on 02/28/2017 -----------------------------------------  Patient reports nausea and spinning sensation improved. Continues to have a mild frontal headache. Overall reports improving. Agreeable and understanding of plan to obtain CT scan with contrast. Also thereafter likely an MRI. Patient denies any metal implants except for previous sternotomy, no pacemaker  Ongoing care assigned to Dr. Lenise Arena with plan to follow-up on stroke work-up  including CT angios head neck, we'll consider admission for MRI versus MRI performed in the ED thereafter if CT angiography not revealing of lesion or concern. ____________________________________________   FINAL CLINICAL IMPRESSION(S) / ED DIAGNOSES  Final diagnoses:  Vertigo  Headache  in front of head      NEW MEDICATIONS STARTED DURING THIS VISIT:  New Prescriptions   No medications on file     Note:  This document was prepared using Dragon voice recognition software and may include unintentional dictation errors.     Delman Kitten, MD 02/28/17 2126

## 2017-02-28 NOTE — ED Notes (Signed)
Assisted neurologist with tele exam

## 2017-02-28 NOTE — ED Notes (Signed)
Neuro status unchanged. Pt states he will not be able to tolerate an MRI if it is ordered. Pt denies needs at this time. Pt is laughing and visiting with friends and family in room.

## 2017-03-01 ENCOUNTER — Observation Stay
Admit: 2017-03-01 | Discharge: 2017-03-01 | Disposition: A | Discharge status: Home / Self Care | Payer: Medicare HMO | Attending: Family Medicine | Admitting: Family Medicine

## 2017-03-01 ENCOUNTER — Observation Stay: Payer: Medicare HMO

## 2017-03-01 DIAGNOSIS — R42 Dizziness and giddiness: Secondary | ICD-10-CM | POA: Diagnosis not present

## 2017-03-01 DIAGNOSIS — G459 Transient cerebral ischemic attack, unspecified: Secondary | ICD-10-CM | POA: Diagnosis not present

## 2017-03-01 DIAGNOSIS — R51 Headache: Secondary | ICD-10-CM | POA: Diagnosis not present

## 2017-03-01 DIAGNOSIS — I639 Cerebral infarction, unspecified: Secondary | ICD-10-CM | POA: Diagnosis not present

## 2017-03-01 DIAGNOSIS — I4891 Unspecified atrial fibrillation: Secondary | ICD-10-CM | POA: Diagnosis not present

## 2017-03-01 DIAGNOSIS — I482 Chronic atrial fibrillation: Secondary | ICD-10-CM | POA: Diagnosis not present

## 2017-03-01 DIAGNOSIS — R05 Cough: Secondary | ICD-10-CM | POA: Diagnosis not present

## 2017-03-01 LAB — ECHOCARDIOGRAM COMPLETE
AO mean calculated velocity dopler: 267 cm/s
AOPV: 0.29 m/s
AOVTI: 89.7 cm
AV Area VTI index: 0.52 cm2/m2
AV Area mean vel: 1.28 cm2
AV VEL mean LVOT/AV: 0.28
AV area mean vel ind: 0.5 cm2/m2
AV peak Index: 0.51
AV pk vel: 377 cm/s
AVAREAVTI: 1.31 cm2
AVG: 33 mmHg
AVPG: 57 mmHg
AVPHT: 414 ms
CHL CUP AV VALUE AREA INDEX: 0.52
CHL CUP AV VEL: 1.33
CHL CUP DOP CALC LVOT VTI: 26.3 cm
CHL CUP MV DEC (S): 310
E decel time: 310 msec
E/e' ratio: 8.49
FS: 46 % — AB (ref 28–44)
HEIGHTINCHES: 68 in
IVS/LV PW RATIO, ED: 1.08
LA vol A4C: 64.3 ml
LA vol index: 24.6 mL/m2
LADIAMINDEX: 1.91 cm/m2
LASIZE: 49 mm
LAVOL: 63 mL
LEFT ATRIUM END SYS DIAM: 49 mm
LV PW d: 14.8 mm — AB (ref 0.6–1.1)
LV e' LATERAL: 10.4 cm/s
LVEEAVG: 8.49
LVEEMED: 8.49
LVOT area: 4.52 cm2
LVOT diameter: 24 mm
LVOT peak VTI: 0.29 cm
LVOT peak vel: 109 cm/s
LVOTSV: 119 mL
Lateral S' vel: 9.73 cm/s
MV Peak grad: 3 mmHg
MV pk A vel: 81.5 m/s
MV pk E vel: 88.3 m/s
RV TAPSE: 25 mm
TDI e' lateral: 10.4
TDI e' medial: 6.05
Valve area: 1.33 cm2
WEIGHTICAEL: 4587.2 [oz_av]

## 2017-03-01 LAB — GLUCOSE, CAPILLARY
GLUCOSE-CAPILLARY: 176 mg/dL — AB (ref 65–99)
Glucose-Capillary: 159 mg/dL — ABNORMAL HIGH (ref 65–99)
Glucose-Capillary: 171 mg/dL — ABNORMAL HIGH (ref 65–99)

## 2017-03-01 LAB — TROPONIN I
Troponin I: <0.03 ng/mL (ref <0.03)
Troponin I: 0.03 ng/mL (ref <0.03)

## 2017-03-01 LAB — HEMOGLOBIN A1C
HEMOGLOBIN A1C: 8 % — AB (ref 4.8–5.6)
MEAN PLASMA GLUCOSE: 182.9 mg/dL

## 2017-03-01 LAB — LIPID PANEL
Cholesterol: 142 mg/dL (ref 0–200)
HDL: 31 mg/dL — AB (ref >40)
LDL Cholesterol: 70 mg/dL (ref 0–99)
Total CHOL/HDL Ratio: 4.6 RATIO
Triglycerides: 206 mg/dL — ABNORMAL HIGH (ref <150)
VLDL: 41 mg/dL — ABNORMAL HIGH (ref 0–40)

## 2017-03-01 MED ORDER — PANTOPRAZOLE SODIUM 40 MG PO TBEC
40.0000 mg | DELAYED_RELEASE_TABLET | Freq: Every day | ORAL | Status: DC
  Administered 2017-03-01: 08:00:00 40 mg via ORAL
  Filled 2017-03-01: qty 1

## 2017-03-01 MED ORDER — DOCUSATE SODIUM 100 MG PO CAPS
100.0000 mg | ORAL_CAPSULE | Freq: Two times a day (BID) | ORAL | Status: DC
  Administered 2017-03-01: 08:00:00 100 mg via ORAL
  Filled 2017-03-01: qty 1

## 2017-03-01 MED ORDER — ACETAMINOPHEN 650 MG RE SUPP: 650.0000 mg | RECTAL | Status: DC | PRN

## 2017-03-01 MED ORDER — NITROGLYCERIN 0.4 MG SL SUBL: 0.4000 mg | SUBLINGUAL_TABLET | SUBLINGUAL | Status: DC | PRN

## 2017-03-01 MED ORDER — UMECLIDINIUM-VILANTEROL 62.5-25 MCG/INH IN AEPB
1.0000 | INHALATION_SPRAY | Freq: Every day | RESPIRATORY_TRACT | Status: DC
  Administered 2017-03-01: 08:00:00 1 via RESPIRATORY_TRACT
  Filled 2017-03-01: qty 14

## 2017-03-01 MED ORDER — INSULIN ASPART 100 UNIT/ML ~~LOC~~ SOLN
0.0000 [IU] | Freq: Three times a day (TID) | SUBCUTANEOUS | Status: DC
Start: 2017-03-01 — End: 2017-03-01
  Administered 2017-03-01 (×2): 4 [IU] via SUBCUTANEOUS
  Filled 2017-03-01 (×2): qty 1

## 2017-03-01 MED ORDER — TORSEMIDE 20 MG PO TABS
60.0000 mg | ORAL_TABLET | Freq: Every day | ORAL | Status: DC
  Administered 2017-03-01: 08:00:00 60 mg via ORAL
  Filled 2017-03-01: qty 3

## 2017-03-01 MED ORDER — TRAZODONE HCL 50 MG PO TABS: 100.0000 mg | ORAL_TABLET | Freq: Every day | ORAL | Status: DC

## 2017-03-01 MED ORDER — OXYCODONE-ACETAMINOPHEN 5-325 MG PO TABS
1.0000 | ORAL_TABLET | Freq: Four times a day (QID) | ORAL | Status: DC | PRN
  Administered 2017-03-01 (×2): 1 via ORAL
  Filled 2017-03-01 (×2): qty 1

## 2017-03-01 MED ORDER — POTASSIUM CHLORIDE CRYS ER 20 MEQ PO TBCR
20.0000 meq | EXTENDED_RELEASE_TABLET | Freq: Every day | ORAL | Status: DC
Start: 2017-03-01 — End: 2017-03-01
  Administered 2017-03-01: 20 meq via ORAL
  Filled 2017-03-01: qty 1

## 2017-03-01 MED ORDER — ENOXAPARIN SODIUM 40 MG/0.4ML ~~LOC~~ SOLN
40.0000 mg | SUBCUTANEOUS | Status: DC
  Administered 2017-03-01: 40 mg via SUBCUTANEOUS
  Filled 2017-03-01: qty 0.4

## 2017-03-01 MED ORDER — SIMVASTATIN 20 MG PO TABS: 40.0000 mg | ORAL_TABLET | Freq: Every day | ORAL | Status: DC

## 2017-03-01 MED ORDER — IBUPROFEN 400 MG PO TABS
400.0000 mg | ORAL_TABLET | Freq: Four times a day (QID) | ORAL | Status: DC | PRN
Start: 2017-03-01 — End: 2017-03-01
  Administered 2017-03-01: 13:00:00 400 mg via ORAL
  Filled 2017-03-01: qty 1

## 2017-03-01 MED ORDER — INFLUENZA VAC SPLIT QUAD 0.5 ML IM SUSY: 0.5000 mL | PREFILLED_SYRINGE | INTRAMUSCULAR | Status: DC

## 2017-03-01 MED ORDER — ACETAMINOPHEN 160 MG/5ML PO SOLN
650.0000 mg | ORAL | Status: DC | PRN
  Filled 2017-03-01: qty 20.3

## 2017-03-01 MED ORDER — INSULIN ASPART 100 UNIT/ML ~~LOC~~ SOLN: 0.0000 [IU] | Freq: Every day | SUBCUTANEOUS | Status: DC

## 2017-03-01 MED ORDER — INSULIN NPH (HUMAN) (ISOPHANE) 100 UNIT/ML ~~LOC~~ SUSP
100.0000 [IU] | Freq: Two times a day (BID) | SUBCUTANEOUS | Status: DC
  Filled 2017-03-01: qty 10

## 2017-03-01 MED ORDER — INSULIN NPH (HUMAN) (ISOPHANE) 100 UNIT/ML ~~LOC~~ SUSP
100.0000 [IU] | Freq: Two times a day (BID) | SUBCUTANEOUS | Status: DC
  Administered 2017-03-01: 08:00:00 100 [IU] via SUBCUTANEOUS
  Filled 2017-03-01: qty 10

## 2017-03-01 MED ORDER — ASPIRIN EC 81 MG PO TBEC
81.0000 mg | DELAYED_RELEASE_TABLET | Freq: Every day | ORAL | Status: DC
  Administered 2017-03-01: 08:00:00 81 mg via ORAL
  Filled 2017-03-01: qty 1

## 2017-03-01 MED ORDER — SODIUM CHLORIDE 0.9 % IV SOLN
INTRAVENOUS | Status: DC
  Administered 2017-03-01: 03:00:00 via INTRAVENOUS

## 2017-03-01 MED ORDER — STROKE: EARLY STAGES OF RECOVERY BOOK
Freq: Once | Status: AC
  Administered 2017-03-01: 03:00:00

## 2017-03-01 MED ORDER — ACETAMINOPHEN 325 MG PO TABS: 650.0000 mg | ORAL_TABLET | ORAL | Status: DC | PRN

## 2017-03-01 MED ORDER — SENNOSIDES-DOCUSATE SODIUM 8.6-50 MG PO TABS: 1.0000 | ORAL_TABLET | Freq: Every evening | ORAL | Status: DC | PRN

## 2017-03-01 MED ORDER — FERROUS SULFATE 325 (65 FE) MG PO TABS
325.0000 mg | ORAL_TABLET | Freq: Every day | ORAL | Status: DC
  Administered 2017-03-01: 08:00:00 325 mg via ORAL
  Filled 2017-03-01: qty 1

## 2017-03-01 MED ORDER — IPRATROPIUM-ALBUTEROL 0.5-2.5 (3) MG/3ML IN SOLN
3.0000 mL | Freq: Four times a day (QID) | RESPIRATORY_TRACT | Status: DC | PRN
  Administered 2017-03-01: 3 mL via RESPIRATORY_TRACT
  Filled 2017-03-01: qty 3

## 2017-03-01 MED ORDER — INSULIN NPH (HUMAN) (ISOPHANE) 100 UNIT/ML ~~LOC~~ SUSP
100.0000 [IU] | Freq: Two times a day (BID) | SUBCUTANEOUS | Status: DC
Start: 2017-03-01 — End: 2017-03-01

## 2017-03-01 MED ORDER — MECLIZINE HCL 12.5 MG PO TABS: 12.5000 mg | ORAL_TABLET | Freq: Three times a day (TID) | ORAL | 0 refills | Status: DC | PRN

## 2017-03-01 MED ORDER — ALBUTEROL SULFATE (2.5 MG/3ML) 0.083% IN NEBU: 2.5000 mg | INHALATION_SOLUTION | Freq: Four times a day (QID) | RESPIRATORY_TRACT | Status: DC | PRN

## 2017-03-01 MED ORDER — GABAPENTIN 300 MG PO CAPS
300.0000 mg | ORAL_CAPSULE | Freq: Two times a day (BID) | ORAL | Status: DC
  Administered 2017-03-01: 300 mg via ORAL
  Filled 2017-03-01: qty 1

## 2017-03-01 NOTE — ED Notes (Signed)
Pt assisted up to commode to void.  

## 2017-03-01 NOTE — Plan of Care (Signed)
Problem: Education: Goal: Knowledge of [facility] Education information/materials will improve Outcome: Progressing Pt likes to be called Marcus Williams    PAST MEDICAL HISTORY:     Past Medical History:  Diagnosis Date  . A-fib ([place])   . Aortic stenosis, moderate 11/2015  . Arthritis   . CAD (coronary artery disease)   . Chronic systolic CHF (congestive heart failure) ([place])   . Diabetes mellitus without complication ([place])    INSULIN DEPENDENT  . GERD (gastroesophageal reflux disease)   . History of cardioversion   . Hypertension   . MI (myocardial infarction) ([place])   . OSA on CPAP   . Sepsis ([place])   . Shortness of breath dyspnea   . SVT (supraventricular tachycardia) ([facility])    Pt is well controlled with home medications

## 2017-03-01 NOTE — Progress Notes (Signed)
Physical Therapy Evaluation Patient Details Name: Marcus Williams MRN: [id number] DOB: 01-Feb-1956 Today's Date: 03/01/2017   History of Present Illness  Marcus Williams is a 61 y.o. male with a known history of afib, AS, CAD, diastolilc/systolic CHF, DM, GERD, HTN, SVT, COPD  was in a usual state of health until 30 minutes prxperienced the onset of dizziness and diaphoresis associated with  Severe throbbing vomiting and nausea. He denied any numbness, tingling or weakness other than chronic tingling and weakness of the left lower extremity for the past year. Symptoms have all resolved except for headache. He also complains of some cough over the past week, weakness and mild dyspnea on exertion.  Clinical Impression  Patient reports feeling fine today and ready to go home. He is independent with bed mobility, transfers and ambulation without AD. He has no balance deficits in sitting or standing. He has no skilled PT needs at this time and does not need any follow up PT after DC.    Follow Up Recommendations No PT follow up    Equipment Recommendations       Recommendations for Other Services       Precautions / Restrictions Precautions Precautions: None      Mobility  Bed Mobility Overal bed mobility: Independent                Transfers Overall transfer level: Independent Equipment used: None                Ambulation/Gait Ambulation/Gait assistance: Independent Ambulation Distance (Feet): 200 Feet Assistive device: None Gait Pattern/deviations: WFL(Within Functional Limits) Gait velocity: 1.0 m/sec      Stairs            Wheelchair Mobility    Modified Rankin (Stroke Patients Only)       Balance Overall balance assessment: Independent                                           Pertinent Vitals/Pain Pain Assessment: No/denies pain    Home Living Family/patient expects to be discharged to:: Private residence Living Arrangements:  Spouse/significant other Available Help at Discharge: Family;Friend(s) Type of Home: House Home Access: Ramped entrance     Home Layout: One level Home Equipment: None      Prior Function Level of Independence: Independent               Hand Dominance        Extremity/Trunk Assessment   Upper Extremity Assessment Upper Extremity Assessment: Overall WFL for tasks assessed    Lower Extremity Assessment Lower Extremity Assessment: Overall WFL for tasks assessed    Cervical / Trunk Assessment Cervical / Trunk Assessment: Normal  Communication   Communication: No difficulties  Cognition Arousal/Alertness: Awake/alert Behavior During Therapy: WFL for tasks assessed/performed Overall Cognitive Status: Within Functional Limits for tasks assessed                                        General Comments      Exercises     Assessment/Plan    PT Assessment Patent does not need any further PT services  PT Problem List         PT Treatment Interventions      PT Goals (Current goals can be found  in the Care Plan section)  Acute Rehab PT Goals Patient Stated Goal: to go home PT Goal Formulation: With patient Time For Goal Achievement: 03/15/17 Potential to Achieve Goals: Good    Frequency     Barriers to discharge        Co-evaluation               AM-PAC PT "6 Clicks" Daily Activity  Outcome Measure Difficulty turning over in bed (including adjusting bedclothes, sheets and blankets)?: None Difficulty moving from lying on back to sitting on the side of the bed? : None Difficulty sitting down on and standing up from a chair with arms (e.g., wheelchair, bedside commode, etc,.)?: None Help needed moving to and from a bed to chair (including a wheelchair)?: None Help needed walking in hospital room?: None Help needed climbing 3-5 steps with a railing? : None 6 Click Score: 24    End of Session   Activity Tolerance: Patient  tolerated treatment well Patient left: Other (comment) (standing due to needing to use thw urinal)   PT Visit Diagnosis: Difficulty in walking, not elsewhere classified (R26.2)    Time: 0250-0305 PT Time Calculation (min) (ACUTE ONLY): 15 min   Charges:   PT Evaluation $PT Eval Low Complexity: 1 Low     PT G Codes:   PT G-Codes **NOT FOR INPATIENT CLASS** Functional Assessment Tool Used: AM-PAC 6 Clicks Basic Mobility;Clinical judgement Functional Limitation: Mobility: Walking and moving around Mobility: Walking and Moving Around Current Status (U1103): 0 percent impaired, limited or restricted Mobility: Walking and Moving Around Goal Status (P5945): 0 percent impaired, limited or restricted Mobility: Walking and Moving Around Discharge Status (O5929): 0 percent impaired, limited or restricted    Alanson Puls, PT, DPT  [place], [place]ette [place] S 03/01/2017, 3:14 PM

## 2017-03-01 NOTE — ED Notes (Signed)
Transport pt to floor room 113.AS

## 2017-03-01 NOTE — H&P (Signed)
[place] @ [facility] Admission History and Physical Harvie Bridge, D.O.  ---------------------------------------------------------------------------------------------------------------------   PATIENT NAME: Marcus Williams MR#: [id number] DATE OF BIRTH: [birth year] DATE OF ADMISSION: 02/28/2017 PRIMARY CARE PHYSICIAN: Idelle Crouch, MD  REQUESTING/REFERRING PHYSICIAN: ED Dr. Jimmye Norman  CHIEF COMPLAINT: Chief Complaint  Patient presents with  . Code Stroke    HISTORY OF PRESENT ILLNESS: Marcus Williams is a 61 y.o. male with a known history of afib, AS, CAD, diastolilc/systolic CHF, DM, GERD, HTN, SVT, COPD  was in a usual state of health until 30 minutes prxperienced the onset of dizziness and diaphoresis associated with  Severe throbbing vomiting and nausea. He denied any numbness, tingling or weakness other than chronic tingling and weakness of the left lower extremity for the past year. Symptoms have all resolved except for headache. He also complains of some cough over the past week, weakness and mild dyspnea on exertion.  Of note patient is scheduled for outpatient stress test next week with Dr. Ubaldo Glassing. Otherwise there has been no change in status. Patient has been taking medication as prescribed and there has been no recent change in medication or diet.  There has been no recent illness, travel or sick contacts.    Patient denies fevers/chills, weakness, speech disturbance, gait ataxia.chest pain, shortness of breath, constipation, diarrhea, abdominal pain, dysuria/frequency, changes in mental status.   EMS/ED COURSE:  Patient received aspirin, morphine, Zofran. 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 Medical admission was requested for further management of stroke rule out.    PAST MEDICAL HISTORY: Past Medical History:  Diagnosis Date  . A-fib ([place])   . Aortic stenosis, moderate 11/2015  . Arthritis   . CAD  (coronary artery disease)   . Chronic systolic CHF (congestive heart failure) ([place])   . Diabetes mellitus without complication ([place])    INSULIN DEPENDENT  . GERD (gastroesophageal reflux disease)   . History of cardioversion   . Hypertension   . MI (myocardial infarction) ([place])   . OSA on CPAP   . Sepsis ([place])   . Shortness of breath dyspnea   . SVT (supraventricular tachycardia) ([place])       PAST SURGICAL HISTORY: Past Surgical History:  Procedure Laterality Date  . 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 BUNIONECTOMY    . CARDIAC CATHETERIZATION N/A 10/02/2015   Procedure: Coronary/Grafts Angiography;  Surgeon: Teodoro Spray, MD;  Location: [place] [facility];  Service: Cardiovascular;  Laterality: N/A;  . CARDIAC CATHETERIZATION N/A 11/21/2015   Procedure: Right and Left Heart Cath;  Surgeon: Sherren Mocha, MD;  Location: [place] [facility];  Service: Cardiovascular;  Laterality: N/A;  . CORONARY ARTERY BYPASS GRAFT    . KNEE SURGERY    . quadruple bypass    . TEE WITHOUT CARDIOVERSION  11/21/2015  . TEE WITHOUT CARDIOVERSION N/A 11/21/2015   Procedure: TRANSESOPHAGEAL ECHOCARDIOGRAM (TEE);  Surgeon: Larey Dresser, MD;  Location: [facility];  Service: Cardiovascular;  Laterality: N/A;      SOCIAL HISTORY: Social History  Substance Use Topics  . Smoking status: Never Smoker  . Smokeless tobacco: Never Used  . Alcohol use No      FAMILY HISTORY: Family History  Problem Relation Age of Onset  . Stroke Mother   . Heart attack Mother   . Hypertension Mother   .  Heart attack Father   . Hypertension Father   . Heart attack Brother        #1  . Diabetes Brother        #1  . Heart disease Brother        #2  . Lung disease Brother        #2  . Hypertension Brother        #2  . Diabetes Brother        #2     MEDICATIONS AT HOME: Prior to Admission  medications   Medication Sig Start Date End Date Taking? Authorizing Provider  albuterol (PROVENTIL HFA;VENTOLIN HFA) 108 (90 Base) MCG/ACT inhaler Inhale 2 puffs into the lungs every 6 (six) hours as needed for wheezing or shortness of breath. 07/11/15  Yes Kasa, Maretta Bees, MD  aspirin 81 MG EC tablet Take 1 tablet (81 mg total) by mouth daily. 03/10/16  Yes Sherren Mocha, MD  diltiazem (CARDIZEM CD) 300 MG 24 hr capsule Take 1 capsule (300 mg total) by mouth daily. 10/15/14  Yes Idelle Crouch, MD  docusate sodium (COLACE) 100 MG capsule Take 100 mg by mouth 2 (two) times daily.   Yes [provider]  ferrous sulfate 325 (65 FE) MG tablet Take 1 tablet (325 mg total) by mouth daily with breakfast. 05/16/16  Yes Clegg, Amy D, NP  gabapentin (NEURONTIN) 300 MG capsule Take 300 mg by mouth 2 (two) times daily.   Yes [provider]  insulin NPH Human (HUMULIN N,NOVOLIN N) 100 UNIT/ML injection Inject 100 Units into the skin 2 (two) times daily.    Yes [provider]  lisinopril (PRINIVIL,ZESTRIL) 20 MG tablet Take 20 mg by mouth daily.   Yes [provider]  metFORMIN (GLUCOPHAGE) 1000 MG tablet Take 1,000 mg by mouth 2 (two) times daily with a meal.   Yes [provider]  metoprolol (LOPRESSOR) 100 MG tablet Take 100 mg by mouth 2 (two) times daily. 10/15/15  Yes [provider]  nitroGLYCERIN (NITROSTAT) 0.4 MG SL tablet Place 0.4 mg under the tongue every 5 (five) minutes as needed for chest pain.   Yes [provider]  pantoprazole (PROTONIX) 40 MG tablet Take 40 mg by mouth daily.   Yes [provider]  potassium chloride 20 MEQ TBCR Take 20 mEq by mouth daily. 11/23/15  Yes Shirley Friar, PA-C  simvastatin (ZOCOR) 40 MG tablet Take 40 mg by mouth at bedtime.    Yes [provider]  torsemide (DEMADEX) 20 MG tablet TAKE 3 TABLETS (60 MG TOTAL) BY MOUTH DAILY. 07/16/16  Yes Shirley Friar, PA-C  traZODone  (DESYREL) 100 MG tablet Take 100 mg by mouth at bedtime.   Yes [provider]  umeclidinium-vilanterol (ANORO ELLIPTA) 62.5-25 MCG/INH AEPB Inhale 1 puff into the lungs daily. 10/02/16  Yes Flora Lipps, MD      DRUG ALLERGIES: No Known Allergies   REVIEW OF SYSTEMS: CONSTITUTIONAL: No fever/chills, fatigue, weakness, weight gain/loss. EYES: No blurry or double vision. ENT: No tinnitus, postnasal drip, redness or soreness of the oropharynx. RESPIRATORY: No cough, wheeze, hemoptysis, dyspnea. CARDIOVASCULAR: No chest pain, orthopnea, palpitations, syncope. GASTROINTESTINAL: positive nausea, vomiting, negative constipation, diarrhea, abdominal pain, hematemesis, melena or hematochezia. GENITOURINARY: No dysuria or hematuria. ENDOCRINE: No polyuria or nocturia. No heat or cold intolerance. HEMATOLOGY: No anemia, bruising, bleeding. INTEGUMENTARY: No rashes, ulcers, lesions. MUSCULOSKELETAL: No arthritis, swelling, gout. NEUROLOGIC: positive dizziness, headache.No numbness, tingling, weakness or ataxia. No seizure-type activity.  PSYCHIATRIC: No anxiety, depression, insomnia.  PHYSICAL EXAMINATION: VITAL SIGNS: Blood pressure (!) 156/73, pulse 64, temperature 98.4 F (36.9 C), temperature source Oral, resp. rate 15, height 5\' 9"  (1.753 m), weight 129.3 kg (285 lb), SpO2 96 %.  GENERAL: 61 y.o.-year-old male patient, well-developed, well-nourished lying in the bed in no acute distress.  Pleasant and cooperative.   HEENT: Head atraumatic, normocephalic. Pupils equal, round, reactive to light and accommodation. No scleral icterus. Extraocular muscles intact. Nares are patent. Oropharynx is clear. Mucus membranes moist. NECK: Supple, full range of motion. Loud bilateral bruit. No thyroid enlargement, no tenderness, no cervical lymphadenopathy. CHEST: Normal breath sounds bilaterally. No wheezing, rales, rhonchi or crackles. No use of accessory muscles of respiration.  No reproducible  chest wall tenderness.  CARDIOVASCULAR: S1, S2 normal. Heart systolic ejection murmur that radiates to carotids. Cap refill <2 seconds. ABDOMEN: Soft, nontender, nondistended. No rebound, guarding, rigidity. Normoactive bowel sounds present in all four quadrants. No organomegaly or mass. EXTREMITIES: Full range of motion. No pedal edema, cyanosis, or clubbing. NEUROLOGIC: Cranial nerves II through XII are grossly intact with no focal sensorimotor deficit. Muscle strength 5/5 in all extremities. Sensation intact. Gait not checked. PSYCHIATRIC: The patient is alert and oriented x 3. Normal affect, mood, thought content. SKIN: Warm, dry, and intact without obvious rash, lesion, or ulcer.  LABORATORY PANEL:  CBC  Recent Labs Lab 02/28/17 1906  WBC 7.7  HGB 13.3  HCT 37.8*  PLT 142*   ----------------------------------------------------------------------------------------------------------------- Chemistries  Recent Labs Lab 02/28/17 1905  NA 140  K 4.2  CL 110  CO2 21*  GLUCOSE 165*  BUN 28*  CREATININE 1.17  CALCIUM 8.8*   ------------------------------------------------------------------------------------------------------------------ Cardiac Enzymes  Recent Labs Lab 02/28/17 1905  TROPONINI <0.03   ------------------------------------------------------------------------------------------------------------------  RADIOLOGY: Ct Angio Head W Or Wo Contrast  Result Date: 02/28/2017 CLINICAL DATA:  Acute onset dizziness, headache, nausea and vomiting. Follow-up code stroke. History of hypertension, diabetes. EXAM: CT ANGIOGRAPHY HEAD AND NECK TECHNIQUE: Multidetector CT imaging of the head and neck was performed using the standard protocol during bolus administration of intravenous contrast. Multiplanar CT image reconstructions and MIPs were obtained to evaluate the vascular anatomy. Carotid stenosis measurements (when applicable) are obtained utilizing NASCET criteria,  using the distal internal carotid diameter as the denominator. CONTRAST:  75 cc Isovue 370 COMPARISON:  CT HEAD February 28, 2017 at 1844 hours and MRI of the neck February 24, 2012 FINDINGS: CTA NECK AORTIC ARCH: Normal appearance of the thoracic arch, 2 vessel arch is a normal variant. Mild calcific atherosclerosis arch vessel origins. The origins of the innominate, left Common carotid artery and subclavian artery are widely patent. RIGHT CAROTID SYSTEM: Common carotid artery is widely patent, coursing in a straight line fashion. Moderate calcific atherosclerosis carotid bifurcation without hemodynamically significant stenosis by NASCET criteria. Internal carotid artery is patent. Intimal thickening calcific atherosclerosis distal RIGHT cervical internal carotid artery resulting in mild stenosis. LEFT CAROTID SYSTEM: Common carotid artery is widely patent, coursing in a straight line fashion. Moderate calcific atherosclerosis carotid bifurcation without hemodynamically significant stenosis by NASCET criteria. Normal appearance of the included internal carotid artery. VERTEBRAL ARTERIES:Shoulder attenuation limits assessment of arch vessel origins. However, no contrast opacification of RIGHT vertebral artery origin, with focal calcific atherosclerosis. RIGHT vertebral artery was patent and 2013. Thready reconstitution of RIGHT vertebral artery intraforaminal segments, with poor contrast opacification RIGHT V3 segment. SKELETON: No acute osseous process though bone windows have not been submitted. Status post median sternotomy. 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 Severe C6-7 and C7-T1 degenerative discs. OTHER NECK: Soft tissues of the neck are nonacute though, not tailored for evaluation. 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 UPPER CHEST: Included lung apices are clear. Subcentimeter mediastinal lymph nodes may be reactive. CTA HEAD ANTERIOR CIRCULATION: Patent cervical internal carotid  arteries, petrous, cavernous and supra clinoid internal carotid arteries. Calcific atherosclerosis resulting in moderate stenosis bilateral para clinoid internal carotid artery's. Patent anterior and middle cerebral arteries. No large vessel occlusion, significant stenosis, contrast extravasation or aneurysm. POSTERIOR CIRCULATION: Reconstituted RIGHT intradural vertebral artery. Bilateral posterior inferior cerebellar arteries are patent. Patent vertebrobasilar junction and basilar artery, as well as main branch vessels. Patent posterior cerebral arteries. Robust bilateral posterior communicating artery's. No large vessel occlusion, significant stenosis, contrast extravasation or aneurysm. VENOUS SINUSES: Major dural venous sinuses are patent though not tailored for evaluation on this angiographic examination. ANATOMIC VARIANTS: None. DELAYED PHASE: No abnormal intracranial enhancement. MIP images reviewed. IMPRESSION: CTA NECK: 1. 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 2. Calcific atherosclerosis without hemodynamically significant stenosis of the internal carotid artery's. Mild stenosis distal RIGHT cervical internal carotid artery. CTA HEAD: 1. No emergent large vessel occlusion. 2. Moderate stenosis bilateral internal carotid arteries due to atherosclerosis. Electronically Signed   By: Elon Alas M.D.   On: 02/28/2017 22:26   Ct Angio Neck W And/or Wo Contrast  Result Date: 02/28/2017 CLINICAL DATA:  Acute onset dizziness, headache, nausea and vomiting. Follow-up code stroke. History of hypertension, diabetes. EXAM: CT ANGIOGRAPHY HEAD AND NECK TECHNIQUE: Multidetector CT imaging of the head and neck was performed using the standard protocol during bolus administration of intravenous contrast. Multiplanar CT image reconstructions and MIPs were obtained to evaluate the vascular anatomy. Carotid stenosis  measurements (when applicable) are obtained utilizing NASCET criteria, using the distal internal carotid diameter as the denominator. CONTRAST:  75 cc Isovue 370 COMPARISON:  CT HEAD February 28, 2017 at 1844 hours and MRI of the neck February 24, 2012 FINDINGS: CTA NECK AORTIC ARCH: Normal appearance of the thoracic arch, 2 vessel arch is a normal variant. Mild calcific atherosclerosis arch vessel origins. The origins of the innominate, left Common carotid artery and subclavian artery are widely patent. RIGHT CAROTID SYSTEM: Common carotid artery is widely patent, coursing in a straight line fashion. Moderate calcific atherosclerosis carotid bifurcation without hemodynamically significant stenosis by NASCET criteria. Internal carotid artery is patent. Intimal thickening calcific atherosclerosis distal RIGHT cervical internal carotid artery resulting in mild stenosis. LEFT CAROTID SYSTEM: Common carotid artery is widely patent, coursing in a straight line fashion. Moderate calcific atherosclerosis carotid bifurcation without hemodynamically significant stenosis by NASCET criteria. Normal appearance of the included internal carotid artery. VERTEBRAL ARTERIES:Shoulder attenuation limits assessment of arch vessel origins. However, no contrast opacification of RIGHT vertebral artery origin, with focal calcific atherosclerosis. RIGHT vertebral artery was patent and 2013. Thready reconstitution of RIGHT vertebral artery intraforaminal segments, with poor contrast opacification RIGHT V3 segment. SKELETON: No acute osseous process though bone windows have not been submitted. Status post median sternotomy. 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 Severe C6-7 and C7-T1 degenerative discs. OTHER NECK: Soft tissues of the neck are nonacute though, not tailored for evaluation. 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 UPPER CHEST: Included lung apices are clear. Subcentimeter mediastinal lymph nodes may be  reactive. CTA HEAD ANTERIOR CIRCULATION: Patent cervical internal carotid arteries, petrous, cavernous and supra clinoid internal carotid arteries. Calcific atherosclerosis resulting in moderate stenosis  bilateral para clinoid internal carotid artery's. Patent anterior and middle cerebral arteries. No large vessel occlusion, significant stenosis, contrast extravasation or aneurysm. POSTERIOR CIRCULATION: Reconstituted RIGHT intradural vertebral artery. Bilateral posterior inferior cerebellar arteries are patent. Patent vertebrobasilar junction and basilar artery, as well as main branch vessels. Patent posterior cerebral arteries. Robust bilateral posterior communicating artery's. No large vessel occlusion, significant stenosis, contrast extravasation or aneurysm. VENOUS SINUSES: Major dural venous sinuses are patent though not tailored for evaluation on this angiographic examination. ANATOMIC VARIANTS: None. DELAYED PHASE: No abnormal intracranial enhancement. MIP images reviewed. IMPRESSION: CTA NECK: 1. 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 2. Calcific atherosclerosis without hemodynamically significant stenosis of the internal carotid artery's. Mild stenosis distal RIGHT cervical internal carotid artery. CTA HEAD: 1. No emergent large vessel occlusion. 2. Moderate stenosis bilateral internal carotid arteries due to atherosclerosis. Electronically Signed   By: Elon Alas M.D.   On: 02/28/2017 22:26   Ct Head Code Stroke Wo Contrast  Result Date: 02/28/2017 CLINICAL DATA:  Code stroke.  Dizziness EXAM: CT HEAD WITHOUT CONTRAST TECHNIQUE: Contiguous axial images were obtained from the base of the skull through the vertex without intravenous contrast. COMPARISON:  Head CT 12/21/2014 FINDINGS: Brain: No mass lesion or acute hemorrhage. No focal hypoattenuation of the basal ganglia or cortex to indicate infarcted  tissue. No hydrocephalus or age advanced atrophy. Mild bilateral basal ganglia mineralization. Vascular: No hyperdense vessel. No advanced atherosclerotic calcification of the arteries at the skull base. Skull: Normal visualized skull base, calvarium and extracranial soft tissues. Sinuses/Orbits: No sinus fluid levels or advanced mucosal thickening. No mastoid effusion. Normal orbits. ASPECTS [facility] Stroke Program Early CT Score) - Ganglionic level infarction (caudate, lentiform nuclei, internal capsule, insula, M1-M3 cortex): 7 - Supraganglionic infarction (M4-M6 cortex): 3 Total score (0-10 with 10 being normal): 10 IMPRESSION: 1. No acute hemorrhage or mass lesion. 2. ASPECTS is 10. These results were called by telephone at the time of interpretation on 02/28/2017 at 6:59 pm to Dr. Delman Kitten , who verbally acknowledged these results. Electronically Signed   By: Ulyses Jarred M.D.   On: 02/28/2017 18:59    EKG: normal sinus rhythm at 55 bpm, normal axis, right bundle branch block, nonspecific ST-T wave changes  IMPRESSION AND PLAN:  This is a 61 y.o. male with a history of afib s/p cardioversion, AS, CAD, diastolilc/systolic CHF, DM, GERD, HTN, SVT, COPD  now being admitted with:  #. Headache, dizziness rule out CVA - Admit telemetry observation for neuro workup including: - Studies: Echo, Carotids, MRI/MRA, check CXR - Labs: CBC, BMP, Lipids, TFTs, A1C - Nursing: Neurochecks, O2, NPO pending dysphagia screen, permissive hypertension.  - Consults: Neurology, Cardiology, PT/OT, S/S consults.  - Meds: Daily aspirin 81mg .   - Fluids: IVNS@75cc /hr.   - Routine DVT Px: with Lovenox, SCDs, early ambulation  #. History of hypertension Hold lisinopril, Lopressor, Cardizem  #. History of neuropathy Continue Neurontin  #. History of COPD Continue Anoro  #. History of GERD Continue Protonix  #. History of hyperlipidemia Continue Zocor  #. History of DM - Accuchecks achs  Code Status:  Full  All the records are reviewed and case discussed with ED provider. Management plans discussed with the patient and/or family who express understanding and agree with plan of care.   TOTAL TIME TAKING CARE OF THIS PATIENT: 60 minutes.   Annison Birchard D.O. on 03/01/2017 at 12:04 AM Between 7am to 6pm - Pager -  [phone] After 6pm go to [email or web address] - Proofreader [employer] [facility] Office [phone] CC: Primary care physician; Idelle Crouch, MD     Note: This dictation was prepared with Dragon dictation along with smaller phrase technology. Any transcriptional errors that result from this process are unintentional.

## 2017-03-01 NOTE — Consult Note (Signed)
Reason for Consult:Headache Referring Physician: Dr. Darvin Neighbours  CC: headache, dizziness   HPI: Marcus Williams is an 61 y.o. male with hx of afib not on anticoagulation, AS, CAD, diastolilc/systolic CHF, DM, GERD, HTN, SVT, COPD presents with dizziness and diaphoresis associated with throb bing diffuse headache, vomiting and nausea. He denied any numbness, tingling or weakness other than chronic tingling and weakness of the left lower extremity for the past year. Symptoms have all resolved except for headache. CTH no acute abnormalities  Past Medical History:  Diagnosis Date  . A-fib ([place])   . Aortic stenosis, moderate 11/2015  . Arthritis   . CAD (coronary artery disease)   . Chronic systolic CHF (congestive heart failure) ([place])   . Diabetes mellitus without complication ([place])    INSULIN DEPENDENT  . GERD (gastroesophageal reflux disease)   . History of cardioversion   . Hypertension   . MI (myocardial infarction) ([place])   . OSA on CPAP   . Sepsis ([place])   . Shortness of breath dyspnea   . SVT (supraventricular tachycardia) ([facility])     Past Surgical History:  Procedure Laterality Date  . 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 BUNIONECTOMY    . CARDIAC CATHETERIZATION N/A 10/02/2015   Procedure: Coronary/Grafts Angiography;  Surgeon: Teodoro Spray, MD;  Location: [place] [facility];  Service: Cardiovascular;  Laterality: N/A;  . CARDIAC CATHETERIZATION N/A 11/21/2015   Procedure: Right and Left Heart Cath;  Surgeon: Sherren Mocha, MD;  Location: [place] [facility];  Service: Cardiovascular;  Laterality: N/A;  . CORONARY ARTERY BYPASS GRAFT    . KNEE SURGERY    . quadruple bypass    . TEE WITHOUT CARDIOVERSION  11/21/2015  . TEE WITHOUT CARDIOVERSION N/A 11/21/2015   Procedure: TRANSESOPHAGEAL ECHOCARDIOGRAM (TEE);  Surgeon: Larey Dresser, MD;  Location: [facility] ENDOSCOPY;  Service:  Cardiovascular;  Laterality: N/A;    Family History  Problem Relation Age of Onset  . Stroke Mother   . Heart attack Mother   . Hypertension Mother   . Heart attack Father   . Hypertension Father   . Heart attack Brother        #1  . Diabetes Brother        #1  . Heart disease Brother        #2  . Lung disease Brother        #2  . Hypertension Brother        #2  . Diabetes Brother        #2    Social History:  reports that he has never smoked. He has never used smokeless tobacco. He reports that he does not drink alcohol or use drugs.  No Known Allergies  Medications: I have reviewed the patient's current medications.  ROS: History obtained from the patient  General ROS: negative for - chills, fatigue, fever, night sweats, weight gain or weight loss Psychological ROS: negative for - behavioral disorder, hallucinations, memory difficulties, mood swings or suicidal ideation Ophthalmic ROS: negative for - blurry vision, double vision, eye pain or loss of vision ENT ROS: negative for - epistaxis, nasal discharge, oral lesions, sore throat, tinnitus or vertigo Allergy and Immunology ROS: negative for - hives or itchy/watery eyes Hematological and Lymphatic ROS: negative for - bleeding problems, bruising or swollen lymph nodes Endocrine ROS: negative for - galactorrhea, hair pattern changes,  polydipsia/polyuria or temperature intolerance Respiratory ROS: negative for - cough, hemoptysis, shortness of breath or wheezing Cardiovascular ROS: negative for - chest pain, dyspnea on exertion, edema or irregular heartbeat Gastrointestinal ROS: negative for - abdominal pain, diarrhea, hematemesis, nausea/vomiting or stool incontinence Genito-Urinary ROS: negative for - dysuria, hematuria, incontinence or urinary frequency/urgency Musculoskeletal ROS: negative for - joint swelling or muscular weakness Neurological ROS: as noted in HPI Dermatological ROS: negative for rash and skin  lesion changes  Physical Examination: Blood pressure 103/84, pulse 74, temperature 97.9 F (36.6 C), temperature source Oral, resp. rate 20, height 5\' 8"  (1.727 m), weight 130 kg (286 lb 11.2 oz), SpO2 99 %.    Neurological Examination   Mental Status: Alert, oriented, thought content appropriate.  Speech fluent without evidence of aphasia.  Able to follow 3 step commands without difficulty. Cranial Nerves: II: Discs flat bilaterally; Visual fields grossly normal, pupils equal, round, reactive to light and accommodation III,IV, VI: ptosis not present, extra-ocular motions intact bilaterally V,VII: smile symmetric, facial light touch sensation normal bilaterally VIII: hearing normal bilaterally IX,X: gag reflex present XI: bilateral shoulder shrug XII: midline tongue extension Motor: Right : Upper extremity   5/5    Left:     Upper extremity   5/5  Lower extremity   5/5     Lower extremity   5/5 Tone and bulk:normal tone throughout; no atrophy noted Sensory: Pinprick and light touch intact throughout, bilaterally Deep Tendon Reflexes: 2+ and symmetric throughout Plantars: Right: downgoing   Left: downgoing Cerebellar: normal finger-to-nose, normal rapid alternating movements and normal heel-to-shin test Gait: not tested       Laboratory Studies:   Basic Metabolic Panel:  Recent Labs Lab 02/28/17 1905  NA 140  K 4.2  CL 110  CO2 21*  GLUCOSE 165*  BUN 28*  CREATININE 1.17  CALCIUM 8.8*    Liver Function Tests: No results for input(s): AST, ALT, ALKPHOS, BILITOT, PROT, ALBUMIN in the last 168 hours. No results for input(s): LIPASE, AMYLASE in the last 168 hours. No results for input(s): AMMONIA in the last 168 hours.  CBC:  Recent Labs Lab 02/28/17 1906  WBC 7.7  NEUTROABS 5.0  HGB 13.3  HCT 37.8*  MCV 89.7  PLT 142*    Cardiac Enzymes:  Recent Labs Lab 02/28/17 1905 03/01/17 0254 03/01/17 0752  TROPONINI <0.03 <0.03 <0.03    BNP: Invalid  input(s): POCBNP  CBG:  Recent Labs Lab 03/01/17 0229 03/01/17 0745 03/01/17 1127  GLUCAP 159* 171* 176*    Microbiology: Results for orders placed or performed during the hospital encounter of 05/04/15  Wound culture     Status: None   Collection Time: 05/05/15  2:52 PM  Result Value Ref Range Status   Specimen Description ABSCESS  Final   Special Requests NONE  Final   Gram Stain   Final    FEW WBC SEEN FEW GRAM POSITIVE COCCI FEW GRAM NEGATIVE RODS    Culture   Final    LIGHT GROWTH STAPHYLOCOCCUS AUREUS NO ANAEROBIC CULTURE ORDERED    Report Status 1Jul 20, 202016 FINAL  Final   Organism ID, Bacteria STAPHYLOCOCCUS AUREUS  Final      Susceptibility   Staphylococcus aureus - MIC*    CIPROFLOXACIN <=0.5 SENSITIVE Sensitive     GENTAMICIN <=0.5 SENSITIVE Sensitive     OXACILLIN 0.5 SENSITIVE Sensitive     TRIMETH/SULFA <=10 SENSITIVE Sensitive     CEFOXITIN SCREEN NEGATIVE Sensitive     Inducible Clindamycin NEGATIVE  Sensitive     TETRACYCLINE Value in next row Sensitive      SENSITIVE<=1    * LIGHT GROWTH STAPHYLOCOCCUS AUREUS  Surgical pcr screen     Status: Abnormal   Collection Time: 05/05/15  3:07 PM  Result Value Ref Range Status   MRSA, PCR NEGATIVE NEGATIVE Final   Staphylococcus aureus POSITIVE (A) NEGATIVE Final    Comment:        The Xpert SA Assay (FDA approved for NASAL specimens in patients over 70 years of age), is one component of a comprehensive surveillance program.  Test performance has been validated by [facility] for patients greater than or equal to 57 year old. It is not intended to diagnose infection nor to guide or monitor treatment.   Culture, blood (routine x 2)     Status: None   Collection Time: 05/05/15 10:22 PM  Result Value Ref Range Status   Specimen Description BLOOD LEFT ASSIST CONTROL  Final   Special Requests BOTTLES DRAWN AEROBIC AND ANAEROBIC 4CC  Final   Culture NO GROWTH 5 DAYS  Final   Report Status 05/10/2015  FINAL  Final  Culture, blood (routine x 2)     Status: None   Collection Time: 05/05/15 10:31 PM  Result Value Ref Range Status   Specimen Description BLOOD RIGHT ASSIST CONTROL  Final   Special Requests BOTTLES DRAWN AEROBIC AND ANAEROBIC 3CC  Final   Culture NO GROWTH 5 DAYS  Final   Report Status 05/10/2015 FINAL  Final    Coagulation Studies:  Recent Labs  02/28/17 1905  LABPROT 12.7  INR 0.96    Urinalysis:  Recent Labs Lab 02/28/17 2304  COLORURINE STRAW*  LABSPEC 1.018  PHURINE 5.0  GLUCOSEU >=500*  HGBUR NEGATIVE  BILIRUBINUR NEGATIVE  KETONESUR NEGATIVE  PROTEINUR NEGATIVE  NITRITE NEGATIVE  LEUKOCYTESUR NEGATIVE    Lipid Panel:     Component Value Date/Time   CHOL 142 03/01/2017 0752   CHOL 104 07/20/2014 0453   TRIG 206 (H) 03/01/2017 0752   TRIG 131 07/20/2014 0453   HDL 31 (L) 03/01/2017 0752   HDL 43 07/20/2014 0453   CHOLHDL 4.6 03/01/2017 0752   VLDL 41 (H) 03/01/2017 0752   VLDL 26 07/20/2014 0453   LDLCALC 70 03/01/2017 0752   LDLCALC 35 07/20/2014 0453    HgbA1C:  Lab Results  Component Value Date   HGBA1C 8.6 (H) 01/14/2015    Urine Drug Screen:     Component Value Date/Time   LABOPIA POSITIVE (A) 02/28/2017 2304   COCAINSCRNUR NONE DETECTED 02/28/2017 2304   LABBENZ NONE DETECTED 02/28/2017 2304   AMPHETMU NONE DETECTED 02/28/2017 2304   THCU NONE DETECTED 02/28/2017 2304   LABBARB NONE DETECTED 02/28/2017 2304    Alcohol Level:  Recent Labs Lab 02/28/17 1905  ETH <5    Other results: A-fib rate controlled  Imaging: Ct Angio Head W Or Wo Contrast  Result Date: 02/28/2017 CLINICAL DATA:  Acute onset dizziness, headache, nausea and vomiting. Follow-up code stroke. History of hypertension, diabetes. EXAM: CT ANGIOGRAPHY HEAD AND NECK TECHNIQUE: Multidetector CT imaging of the head and neck was performed using the standard protocol during bolus administration of intravenous contrast. Multiplanar CT image reconstructions  and MIPs were obtained to evaluate the vascular anatomy. Carotid stenosis measurements (when applicable) are obtained utilizing NASCET criteria, using the distal internal carotid diameter as the denominator. CONTRAST:  75 cc Isovue 370 COMPARISON:  CT HEAD February 28, 2017 at 1844 hours and  MRI of the neck February 24, 2012 FINDINGS: CTA NECK AORTIC ARCH: Normal appearance of the thoracic arch, 2 vessel arch is a normal variant. Mild calcific atherosclerosis arch vessel origins. The origins of the innominate, left Common carotid artery and subclavian artery are widely patent. RIGHT CAROTID SYSTEM: Common carotid artery is widely patent, coursing in a straight line fashion. Moderate calcific atherosclerosis carotid bifurcation without hemodynamically significant stenosis by NASCET criteria. Internal carotid artery is patent. Intimal thickening calcific atherosclerosis distal RIGHT cervical internal carotid artery resulting in mild stenosis. LEFT CAROTID SYSTEM: Common carotid artery is widely patent, coursing in a straight line fashion. Moderate calcific atherosclerosis carotid bifurcation without hemodynamically significant stenosis by NASCET criteria. Normal appearance of the included internal carotid artery. VERTEBRAL ARTERIES:Shoulder attenuation limits assessment of arch vessel origins. However, no contrast opacification of RIGHT vertebral artery origin, with focal calcific atherosclerosis. RIGHT vertebral artery was patent and 2013. Thready reconstitution of RIGHT vertebral artery intraforaminal segments, with poor contrast opacification RIGHT V3 segment. SKELETON: No acute osseous process though bone windows have not been submitted. Status post median sternotomy. 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 Severe C6-7 and C7-T1 degenerative discs. OTHER NECK: Soft tissues of the neck are nonacute though, not tailored for evaluation. 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 UPPER CHEST:  Included lung apices are clear. Subcentimeter mediastinal lymph nodes may be reactive. CTA HEAD ANTERIOR CIRCULATION: Patent cervical internal carotid arteries, petrous, cavernous and supra clinoid internal carotid arteries. Calcific atherosclerosis resulting in moderate stenosis bilateral para clinoid internal carotid artery's. Patent anterior and middle cerebral arteries. No large vessel occlusion, significant stenosis, contrast extravasation or aneurysm. POSTERIOR CIRCULATION: Reconstituted RIGHT intradural vertebral artery. Bilateral posterior inferior cerebellar arteries are patent. Patent vertebrobasilar junction and basilar artery, as well as main branch vessels. Patent posterior cerebral arteries. Robust bilateral posterior communicating artery's. No large vessel occlusion, significant stenosis, contrast extravasation or aneurysm. VENOUS SINUSES: Major dural venous sinuses are patent though not tailored for evaluation on this angiographic examination. ANATOMIC VARIANTS: None. DELAYED PHASE: No abnormal intracranial enhancement. MIP images reviewed. IMPRESSION: CTA NECK: 1. 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 2. Calcific atherosclerosis without hemodynamically significant stenosis of the internal carotid artery's. Mild stenosis distal RIGHT cervical internal carotid artery. CTA HEAD: 1. No emergent large vessel occlusion. 2. Moderate stenosis bilateral internal carotid arteries due to atherosclerosis. Electronically Signed   By: Elon Alas M.D.   On: 02/28/2017 22:26   Dg Chest 1 View  Result Date: 03/01/2017 CLINICAL DATA:  Cough.  Dizziness. EXAM: CHEST 1 VIEW COMPARISON:  Radiographs 918 FINDINGS: Post median sternotomy and CABG. Unchanged heart size and mediastinal contours allowing for differences in technique. No consolidation, pulmonary edema, pleural fluid or pneumothorax. Stable osseous structures.  IMPRESSION: No acute abnormality. Electronically Signed   By: Jeb Levering M.D.   On: 03/01/2017 01:39   Ct Angio Neck W And/or Wo Contrast  Result Date: 02/28/2017 CLINICAL DATA:  Acute onset dizziness, headache, nausea and vomiting. Follow-up code stroke. History of hypertension, diabetes. EXAM: CT ANGIOGRAPHY HEAD AND NECK TECHNIQUE: Multidetector CT imaging of the head and neck was performed using the standard protocol during bolus administration of intravenous contrast. Multiplanar CT image reconstructions and MIPs were obtained to evaluate the vascular anatomy. Carotid stenosis measurements (when applicable) are obtained utilizing NASCET criteria, using the distal internal carotid diameter as the denominator. CONTRAST:  75 cc Isovue 370 COMPARISON:  CT HEAD February 28, 2017 at 1844  hours and MRI of the neck February 24, 2012 FINDINGS: CTA NECK AORTIC ARCH: Normal appearance of the thoracic arch, 2 vessel arch is a normal variant. Mild calcific atherosclerosis arch vessel origins. The origins of the innominate, left Common carotid artery and subclavian artery are widely patent. RIGHT CAROTID SYSTEM: Common carotid artery is widely patent, coursing in a straight line fashion. Moderate calcific atherosclerosis carotid bifurcation without hemodynamically significant stenosis by NASCET criteria. Internal carotid artery is patent. Intimal thickening calcific atherosclerosis distal RIGHT cervical internal carotid artery resulting in mild stenosis. LEFT CAROTID SYSTEM: Common carotid artery is widely patent, coursing in a straight line fashion. Moderate calcific atherosclerosis carotid bifurcation without hemodynamically significant stenosis by NASCET criteria. Normal appearance of the included internal carotid artery. VERTEBRAL ARTERIES:Shoulder attenuation limits assessment of arch vessel origins. However, no contrast opacification of RIGHT vertebral artery origin, with focal calcific atherosclerosis.  RIGHT vertebral artery was patent and 2013. Thready reconstitution of RIGHT vertebral artery intraforaminal segments, with poor contrast opacification RIGHT V3 segment. SKELETON: No acute osseous process though bone windows have not been submitted. Status post median sternotomy. 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 Severe C6-7 and C7-T1 degenerative discs. OTHER NECK: Soft tissues of the neck are nonacute though, not tailored for evaluation. 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 UPPER CHEST: Included lung apices are clear. Subcentimeter mediastinal lymph nodes may be reactive. CTA HEAD ANTERIOR CIRCULATION: Patent cervical internal carotid arteries, petrous, cavernous and supra clinoid internal carotid arteries. Calcific atherosclerosis resulting in moderate stenosis bilateral para clinoid internal carotid artery's. Patent anterior and middle cerebral arteries. No large vessel occlusion, significant stenosis, contrast extravasation or aneurysm. POSTERIOR CIRCULATION: Reconstituted RIGHT intradural vertebral artery. Bilateral posterior inferior cerebellar arteries are patent. Patent vertebrobasilar junction and basilar artery, as well as main branch vessels. Patent posterior cerebral arteries. Robust bilateral posterior communicating artery's. No large vessel occlusion, significant stenosis, contrast extravasation or aneurysm. VENOUS SINUSES: Major dural venous sinuses are patent though not tailored for evaluation on this angiographic examination. ANATOMIC VARIANTS: None. DELAYED PHASE: No abnormal intracranial enhancement. MIP images reviewed. IMPRESSION: CTA NECK: 1. 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 2. Calcific atherosclerosis without hemodynamically significant stenosis of the internal carotid artery's. Mild stenosis distal RIGHT cervical internal carotid artery. CTA HEAD: 1. No emergent large  vessel occlusion. 2. Moderate stenosis bilateral internal carotid arteries due to atherosclerosis. Electronically Signed   By: Elon Alas M.D.   On: 02/28/2017 22:26   Ct Head Code Stroke Wo Contrast  Result Date: 02/28/2017 CLINICAL DATA:  Code stroke.  Dizziness EXAM: CT HEAD WITHOUT CONTRAST TECHNIQUE: Contiguous axial images were obtained from the base of the skull through the vertex without intravenous contrast. COMPARISON:  Head CT 12/21/2014 FINDINGS: Brain: No mass lesion or acute hemorrhage. No focal hypoattenuation of the basal ganglia or cortex to indicate infarcted tissue. No hydrocephalus or age advanced atrophy. Mild bilateral basal ganglia mineralization. Vascular: No hyperdense vessel. No advanced atherosclerotic calcification of the arteries at the skull base. Skull: Normal visualized skull base, calvarium and extracranial soft tissues. Sinuses/Orbits: No sinus fluid levels or advanced mucosal thickening. No mastoid effusion. Normal orbits. ASPECTS [facility] Stroke Program Early CT Score) - Ganglionic level infarction (caudate, lentiform nuclei, internal capsule, insula, M1-M3 cortex): 7 - Supraganglionic infarction (M4-M6 cortex): 3 Total score (0-10 with 10 being normal): 10 IMPRESSION: 1. No acute hemorrhage or mass lesion. 2. ASPECTS is 10. These results were called by telephone at the  time of interpretation on 02/28/2017 at 6:59 pm to Dr. Delman Kitten , who verbally acknowledged these results. Electronically Signed   By: Ulyses Jarred M.D.   On: 02/28/2017 18:59     Assessment/Plan:  61 y/o male with hx of afib, AS, CAD, diastolilc/systolic CHF, DM, GERD, HTN, SVT, COPD  was in a usual state of health until 30 minutes prxperienced the onset of dizziness and diaphoresis associated with  Severe throbbing vomiting and nausea. He denied any numbness, tingling or weakness other than chronic tingling and weakness of the left lower extremity for the past year. Symptoms have all resolved  except for headache. He also complains of some cough over the past week, weakness and mild dyspnea on exertion.  - Pt is awaiting MRI brain - If no acute abnormality on MRI pt can be d/c on ASA 325 - discussed in detail the need for anticoagulation given long hx of a-fib and the increased risk of stroke - spoke about NOAC and that we didn't need to check blood every 2 weeks for INR - cardiology/neurology follow up as out pt.   Leotis Pain  03/01/2017, 1:30 PM

## 2017-03-01 NOTE — Progress Notes (Signed)
TC with Dr.Sudini discussing MRI unable to be performed to pt body size and claustrophobia; he was also given just called critical troponin level of 0.03.  Plans to discharge pt home.

## 2017-03-01 NOTE — Discharge Instructions (Addendum)
Resume diet and activity as before ° ° °

## 2017-03-01 NOTE — ED Notes (Signed)
April RN, aware of bed assigned   

## 2017-03-01 NOTE — Progress Notes (Signed)
NIH- 0 with neuro checks WNL's. Pt plans to schedule outpatient MRI and get flu vaccine with his PCP. Oral and written stroke education done with booklet review done.  VSS. Oral and written AVS instructions given with no questions per pt. Inhaler sent home with pt. Pt transported in transport chair to private vehicle with pt discharged home to self/family care.

## 2017-03-02 ENCOUNTER — Other Ambulatory Visit: Payer: Self-pay | Admitting: Cardiology

## 2017-03-02 ENCOUNTER — Other Ambulatory Visit: Payer: Self-pay | Admitting: Internal Medicine

## 2017-03-02 DIAGNOSIS — G459 Transient cerebral ischemic attack, unspecified: Secondary | ICD-10-CM

## 2017-03-02 DIAGNOSIS — G4485 Primary stabbing headache: Secondary | ICD-10-CM

## 2017-03-02 NOTE — Consult Note (Signed)
Reason for Consult: Possible TIA CVA history of A. fib known coronary disease Referring Physician: Dr Darvin Neighbours hospitalist Cardiologist Dr. Nigel Berthold Marcus Williams is an 61 y.o. male.  HPI: Patient has a history of multiple medical problems moderate aortic stenosis known coronary disease history of coronary bypass surgery diastolic/systolic heart failure obesity hypertension diabetes paroxysmal atrial fibrillation who presents with profound headache throbbing nausea vomiting and vertigo. Patient was admitted with suspicion for possible TIA CVA and neurology was consulted. Patient's had history of A. fib including cardioversion in the past 2 he is on an aspirin on a long-term anticoagulation is worried about the expense. Patient states to have reasonable diabetes and hypertension control has obstructive sleep apnea and doesn't smoke sees Dr. Ubaldo Glassing  Past Medical History:  Diagnosis Date  . A-fib ([place])   . Aortic stenosis, moderate 11/2015  . Arthritis   . CAD (coronary artery disease)   . Chronic systolic CHF (congestive heart failure) ([place])   . Diabetes mellitus without complication ([place])    INSULIN DEPENDENT  . GERD (gastroesophageal reflux disease)   . History of cardioversion   . Hypertension   . MI (myocardial infarction) ([place])   . OSA on CPAP   . Sepsis ([place])   . Shortness of breath dyspnea   . SVT (supraventricular tachycardia) ([facility])     Past Surgical History:  Procedure Laterality Date  . 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 BUNIONECTOMY    . CARDIAC CATHETERIZATION N/A 10/02/2015   Procedure: Coronary/Grafts Angiography;  Surgeon: Teodoro Spray, MD;  Location: [place] [facility];  Service: Cardiovascular;  Laterality: N/A;  . CARDIAC CATHETERIZATION N/A 11/21/2015   Procedure: Right and Left Heart Cath;  Surgeon: Sherren Mocha, MD;  Location: [place] [facility];   Service: Cardiovascular;  Laterality: N/A;  . CORONARY ARTERY BYPASS GRAFT    . KNEE SURGERY    . quadruple bypass    . TEE WITHOUT CARDIOVERSION  11/21/2015  . TEE WITHOUT CARDIOVERSION N/A 11/21/2015   Procedure: TRANSESOPHAGEAL ECHOCARDIOGRAM (TEE);  Surgeon: Larey Dresser, MD;  Location: [facility] ENDOSCOPY;  Service: Cardiovascular;  Laterality: N/A;    Family History  Problem Relation Age of Onset  . Stroke Mother   . Heart attack Mother   . Hypertension Mother   . Heart attack Father   . Hypertension Father   . Heart attack Brother        #1  . Diabetes Brother        #1  . Heart disease Brother        #2  . Lung disease Brother        #2  . Hypertension Brother        #2  . Diabetes Brother        #2    Social History:  reports that he has never smoked. He has never used smokeless tobacco. He reports that he does not drink alcohol or use drugs.  Allergies: No Known Allergies  Medications: I have reviewed the patient's current medications.  Results for orders placed or performed during the hospital encounter of 02/28/17 (from the past 48 hour(s))  Ethanol     Status: None   Collection Time: 02/28/17  7:05 PM  Result Value Ref Range   Alcohol, Ethyl (B) <5 <5 mg/dL    Comment:  LOWEST DETECTABLE LIMIT FOR SERUM ALCOHOL IS 5 mg/dL FOR MEDICAL PURPOSES ONLY   Protime-INR     Status: None   Collection Time: 02/28/17  7:05 PM  Result Value Ref Range   Prothrombin Time 12.7 11.4 - 15.2 seconds   INR 0.96   APTT     Status: None   Collection Time: 02/28/17  7:05 PM  Result Value Ref Range   aPTT 27 24 - 36 seconds  Basic metabolic panel     Status: Abnormal   Collection Time: 02/28/17  7:05 PM  Result Value Ref Range   Sodium 140 135 - 145 mmol/L   Potassium 4.2 3.5 - 5.1 mmol/L   Chloride 110 101 - 111 mmol/L   CO2 21 (L) 22 - 32 mmol/L   Glucose, Bld 165 (H) 65 - 99 mg/dL   BUN 28 (H) 6 - 20 mg/dL   Creatinine, Ser 1.17 0.61 - 1.24 mg/dL   Calcium 8.8  (L) 8.9 - 10.3 mg/dL   GFR calc non Af Amer >60 >60 mL/min   GFR calc Af Amer >60 >60 mL/min    Comment: (NOTE) The eGFR has been calculated using the CKD EPI equation. This calculation has not been validated in all clinical situations. eGFR's persistently <60 mL/min signify possible Chronic Kidney Disease.    Anion gap 9 5 - 15  Troponin I     Status: None   Collection Time: 02/28/17  7:05 PM  Result Value Ref Range   Troponin I <0.03 <0.03 ng/mL  CBC     Status: Abnormal   Collection Time: 02/28/17  7:06 PM  Result Value Ref Range   WBC 7.7 3.8 - 10.6 K/uL   RBC 4.21 (L) 4.40 - 5.90 MIL/uL   Hemoglobin 13.3 13.0 - 18.0 g/dL   HCT 37.8 (L) 40.0 - 52.0 %   MCV 89.7 80.0 - 100.0 fL   MCH 31.6 26.0 - 34.0 pg   MCHC 35.3 32.0 - 36.0 g/dL   RDW 13.7 11.5 - 14.5 %   Platelets 142 (L) 150 - 440 K/uL  Differential     Status: None   Collection Time: 02/28/17  7:06 PM  Result Value Ref Range   Neutrophils Relative % 66 %   Neutro Abs 5.0 1.4 - 6.5 K/uL   Lymphocytes Relative 18 %   Lymphs Abs 1.4 1.0 - 3.6 K/uL   Monocytes Relative 8 %   Monocytes Absolute 0.6 0.2 - 1.0 K/uL   Eosinophils Relative 7 %   Eosinophils Absolute 0.5 0 - 0.7 K/uL   Basophils Relative 1 %   Basophils Absolute 0.1 0 - 0.1 K/uL  Urine Drug Screen, Qualitative     Status: Abnormal   Collection Time: 02/28/17 11:04 PM  Result Value Ref Range   Tricyclic, Ur Screen NONE DETECTED NONE DETECTED   Amphetamines, Ur Screen NONE DETECTED NONE DETECTED   MDMA (Ecstasy)Ur Screen NONE DETECTED NONE DETECTED   Cocaine Metabolite,Ur [place] NONE DETECTED NONE DETECTED   Opiate, Ur Screen POSITIVE (A) NONE DETECTED   Phencyclidine (PCP) Ur S NONE DETECTED NONE DETECTED   Cannabinoid 50 Ng, Ur [place] NONE DETECTED NONE DETECTED   Barbiturates, Ur Screen NONE DETECTED NONE DETECTED   Benzodiazepine, Ur Scrn NONE DETECTED NONE DETECTED   Methadone Scn, Ur NONE DETECTED NONE DETECTED    Comment: (NOTE) 993  Tricyclics, urine                Cutoff 1000 ng/mL 200  Amphetamines, urine             Cutoff 1000 ng/mL 300  MDMA (Ecstasy), urine           Cutoff 500 ng/mL 400  Cocaine Metabolite, urine       Cutoff 300 ng/mL 500  Opiate, urine                   Cutoff 300 ng/mL 600  Phencyclidine (PCP), urine      Cutoff 25 ng/mL 700  Cannabinoid, urine              Cutoff 50 ng/mL 800  Barbiturates, urine             Cutoff 200 ng/mL 900  Benzodiazepine, urine           Cutoff 200 ng/mL 1000 Methadone, urine                Cutoff 300 ng/mL 1100 1200 The urine drug screen provides only a preliminary, unconfirmed 1300 analytical test result and should not be used for non-medical 1400 purposes. Clinical consideration and professional judgment should 1500 be applied to any positive drug screen result due to possible 1600 interfering substances. A more specific alternate chemical method 1700 must be used in order to obtain a confirmed analytical result.  1800 Gas chromato graphy / mass spectrometry (GC/MS) is the preferred 1900 confirmatory method.   Urinalysis, Routine w reflex microscopic     Status: Abnormal   Collection Time: 02/28/17 11:04 PM  Result Value Ref Range   Color, Urine STRAW (A) YELLOW   APPearance CLEAR (A) CLEAR   Specific Gravity, Urine 1.018 1.005 - 1.030   pH 5.0 5.0 - 8.0   Glucose, UA >=500 (A) NEGATIVE mg/dL   Hgb urine dipstick NEGATIVE NEGATIVE   Bilirubin Urine NEGATIVE NEGATIVE   Ketones, ur NEGATIVE NEGATIVE mg/dL   Protein, ur NEGATIVE NEGATIVE mg/dL   Nitrite NEGATIVE NEGATIVE   Leukocytes, UA NEGATIVE NEGATIVE   RBC / HPF NONE SEEN 0 - 5 RBC/hpf   WBC, UA 0-5 0 - 5 WBC/hpf   Bacteria, UA NONE SEEN NONE SEEN   Squamous Epithelial / LPF 0-5 (A) NONE SEEN   Hyaline Casts, UA PRESENT   Glucose, capillary     Status: Abnormal   Collection Time: 03/01/17  2:29 AM  Result Value Ref Range   Glucose-Capillary 159 (H) 65 - 99 mg/dL  Troponin I     Status: None   Collection Time:  03/01/17  2:54 AM  Result Value Ref Range   Troponin I <0.03 <0.03 ng/mL  Glucose, capillary     Status: Abnormal   Collection Time: 03/01/17  7:45 AM  Result Value Ref Range   Glucose-Capillary 171 (H) 65 - 99 mg/dL  Troponin I     Status: None   Collection Time: 03/01/17  7:52 AM  Result Value Ref Range   Troponin I <0.03 <0.03 ng/mL  Hemoglobin A1c     Status: Abnormal   Collection Time: 03/01/17  7:52 AM  Result Value Ref Range   Hgb A1c MFr Bld 8.0 (H) 4.8 - 5.6 %    Comment: (NOTE) Pre diabetes:          5.7%-6.4% Diabetes:              >6.4% Glycemic control for   <7.0% adults with diabetes    Mean Plasma Glucose 182.9 mg/dL    Comment: Performed at [facility]  Lab, [address], [place], [place] [address]  Lipid panel     Status: Abnormal   Collection Time: 03/01/17  7:52 AM  Result Value Ref Range   Cholesterol 142 0 - 200 mg/dL   Triglycerides 206 (H) <150 mg/dL   HDL 31 (L) >40 mg/dL   Total CHOL/HDL Ratio 4.6 RATIO   VLDL 41 (H) 0 - 40 mg/dL   LDL Cholesterol 70 0 - 99 mg/dL    Comment:        Total Cholesterol/HDL:CHD Risk Coronary Heart Disease Risk Table                     Men   Women  1/2 Average Risk   3.4   3.3  Average Risk       5.0   4.4  2 X Average Risk   9.6   7.1  3 X Average Risk  23.4   11.0        Use the calculated Patient Ratio above and the CHD Risk Table to determine the patient's CHD Risk.        ATP III CLASSIFICATION (LDL):  <100     mg/dL   Optimal  100-129  mg/dL   Near or Above                    Optimal  130-159  mg/dL   Borderline  160-189  mg/dL   High  >190     mg/dL   Very High   Glucose, capillary     Status: Abnormal   Collection Time: 03/01/17 11:27 AM  Result Value Ref Range   Glucose-Capillary 176 (H) 65 - 99 mg/dL  Troponin I     Status: Abnormal   Collection Time: 03/01/17  1:45 PM  Result Value Ref Range   Troponin I 0.03 (HH) <0.03 ng/mL    Comment: CRITICAL RESULT CALLED TO, READ BACK BY AND  VERIFIED WITH JANE HODGE @ 1439 03/01/17 BY [facility]     Ct Angio Head W Or Wo Contrast  Result Date: 02/28/2017 CLINICAL DATA:  Acute onset dizziness, headache, nausea and vomiting. Follow-up code stroke. History of hypertension, diabetes. EXAM: CT ANGIOGRAPHY HEAD AND NECK TECHNIQUE: Multidetector CT imaging of the head and neck was performed using the standard protocol during bolus administration of intravenous contrast. Multiplanar CT image reconstructions and MIPs were obtained to evaluate the vascular anatomy. Carotid stenosis measurements (when applicable) are obtained utilizing NASCET criteria, using the distal internal carotid diameter as the denominator. CONTRAST:  75 cc Isovue 370 COMPARISON:  CT HEAD February 28, 2017 at 1844 hours and MRI of the neck February 24, 2012 FINDINGS: CTA NECK AORTIC ARCH: Normal appearance of the thoracic arch, 2 vessel arch is a normal variant. Mild calcific atherosclerosis arch vessel origins. The origins of the innominate, left Common carotid artery and subclavian artery are widely patent. RIGHT CAROTID SYSTEM: Common carotid artery is widely patent, coursing in a straight line fashion. Moderate calcific atherosclerosis carotid bifurcation without hemodynamically significant stenosis by NASCET criteria. Internal carotid artery is patent. Intimal thickening calcific atherosclerosis distal RIGHT cervical internal carotid artery resulting in mild stenosis. LEFT CAROTID SYSTEM: Common carotid artery is widely patent, coursing in a straight line fashion. Moderate calcific atherosclerosis carotid bifurcation without hemodynamically significant stenosis by NASCET criteria. Normal appearance of the included internal carotid artery. VERTEBRAL ARTERIES:Shoulder attenuation limits assessment of arch vessel origins. However, no contrast opacification of RIGHT vertebral artery origin, with  focal calcific atherosclerosis. RIGHT vertebral artery was patent and 2013. Thready  reconstitution of RIGHT vertebral artery intraforaminal segments, with poor contrast opacification RIGHT V3 segment. SKELETON: No acute osseous process though bone windows have not been submitted. Status post median sternotomy. 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 Severe C6-7 and C7-T1 degenerative discs. OTHER NECK: Soft tissues of the neck are nonacute though, not tailored for evaluation. 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 UPPER CHEST: Included lung apices are clear. Subcentimeter mediastinal lymph nodes may be reactive. CTA HEAD ANTERIOR CIRCULATION: Patent cervical internal carotid arteries, petrous, cavernous and supra clinoid internal carotid arteries. Calcific atherosclerosis resulting in moderate stenosis bilateral para clinoid internal carotid artery's. Patent anterior and middle cerebral arteries. No large vessel occlusion, significant stenosis, contrast extravasation or aneurysm. POSTERIOR CIRCULATION: Reconstituted RIGHT intradural vertebral artery. Bilateral posterior inferior cerebellar arteries are patent. Patent vertebrobasilar junction and basilar artery, as well as main branch vessels. Patent posterior cerebral arteries. Robust bilateral posterior communicating artery's. No large vessel occlusion, significant stenosis, contrast extravasation or aneurysm. VENOUS SINUSES: Major dural venous sinuses are patent though not tailored for evaluation on this angiographic examination. ANATOMIC VARIANTS: None. DELAYED PHASE: No abnormal intracranial enhancement. MIP images reviewed. IMPRESSION: CTA NECK: 1. 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 2. Calcific atherosclerosis without hemodynamically significant stenosis of the internal carotid artery's. Mild stenosis distal RIGHT cervical internal carotid artery. CTA HEAD: 1. No emergent large vessel occlusion. 2. Moderate stenosis bilateral  internal carotid arteries due to atherosclerosis. Electronically Signed   By: Elon Alas M.D.   On: 02/28/2017 22:26   Dg Chest 1 View  Result Date: 03/01/2017 CLINICAL DATA:  Cough.  Dizziness. EXAM: CHEST 1 VIEW COMPARISON:  Radiographs 918 FINDINGS: Post median sternotomy and CABG. Unchanged heart size and mediastinal contours allowing for differences in technique. No consolidation, pulmonary edema, pleural fluid or pneumothorax. Stable osseous structures. IMPRESSION: No acute abnormality. Electronically Signed   By: Jeb Levering M.D.   On: 03/01/2017 01:39   Ct Angio Neck W And/or Wo Contrast  Result Date: 02/28/2017 CLINICAL DATA:  Acute onset dizziness, headache, nausea and vomiting. Follow-up code stroke. History of hypertension, diabetes. EXAM: CT ANGIOGRAPHY HEAD AND NECK TECHNIQUE: Multidetector CT imaging of the head and neck was performed using the standard protocol during bolus administration of intravenous contrast. Multiplanar CT image reconstructions and MIPs were obtained to evaluate the vascular anatomy. Carotid stenosis measurements (when applicable) are obtained utilizing NASCET criteria, using the distal internal carotid diameter as the denominator. CONTRAST:  75 cc Isovue 370 COMPARISON:  CT HEAD February 28, 2017 at 1844 hours and MRI of the neck February 24, 2012 FINDINGS: CTA NECK AORTIC ARCH: Normal appearance of the thoracic arch, 2 vessel arch is a normal variant. Mild calcific atherosclerosis arch vessel origins. The origins of the innominate, left Common carotid artery and subclavian artery are widely patent. RIGHT CAROTID SYSTEM: Common carotid artery is widely patent, coursing in a straight line fashion. Moderate calcific atherosclerosis carotid bifurcation without hemodynamically significant stenosis by NASCET criteria. Internal carotid artery is patent. Intimal thickening calcific atherosclerosis distal RIGHT cervical internal carotid artery resulting in mild  stenosis. LEFT CAROTID SYSTEM: Common carotid artery is widely patent, coursing in a straight line fashion. Moderate calcific atherosclerosis carotid bifurcation without hemodynamically significant stenosis by NASCET criteria. Normal appearance of the included internal carotid artery. VERTEBRAL ARTERIES:Shoulder attenuation limits assessment of arch vessel origins. However, no contrast opacification of RIGHT vertebral  artery origin, with focal calcific atherosclerosis. RIGHT vertebral artery was patent and 2013. Thready reconstitution of RIGHT vertebral artery intraforaminal segments, with poor contrast opacification RIGHT V3 segment. SKELETON: No acute osseous process though bone windows have not been submitted. Status post median sternotomy. 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 Severe C6-7 and C7-T1 degenerative discs. OTHER NECK: Soft tissues of the neck are nonacute though, not tailored for evaluation. 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 UPPER CHEST: Included lung apices are clear. Subcentimeter mediastinal lymph nodes may be reactive. CTA HEAD ANTERIOR CIRCULATION: Patent cervical internal carotid arteries, petrous, cavernous and supra clinoid internal carotid arteries. Calcific atherosclerosis resulting in moderate stenosis bilateral para clinoid internal carotid artery's. Patent anterior and middle cerebral arteries. No large vessel occlusion, significant stenosis, contrast extravasation or aneurysm. POSTERIOR CIRCULATION: Reconstituted RIGHT intradural vertebral artery. Bilateral posterior inferior cerebellar arteries are patent. Patent vertebrobasilar junction and basilar artery, as well as main branch vessels. Patent posterior cerebral arteries. Robust bilateral posterior communicating artery's. No large vessel occlusion, significant stenosis, contrast extravasation or aneurysm. VENOUS SINUSES: Major dural venous sinuses are patent though not tailored for evaluation on this  angiographic examination. ANATOMIC VARIANTS: None. DELAYED PHASE: No abnormal intracranial enhancement. MIP images reviewed. IMPRESSION: CTA NECK: 1. 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 2. Calcific atherosclerosis without hemodynamically significant stenosis of the internal carotid artery's. Mild stenosis distal RIGHT cervical internal carotid artery. CTA HEAD: 1. No emergent large vessel occlusion. 2. Moderate stenosis bilateral internal carotid arteries due to atherosclerosis. Electronically Signed   By: Elon Alas M.D.   On: 02/28/2017 22:26   Ct Head Code Stroke Wo Contrast  Result Date: 02/28/2017 CLINICAL DATA:  Code stroke.  Dizziness EXAM: CT HEAD WITHOUT CONTRAST TECHNIQUE: Contiguous axial images were obtained from the base of the skull through the vertex without intravenous contrast. COMPARISON:  Head CT 12/21/2014 FINDINGS: Brain: No mass lesion or acute hemorrhage. No focal hypoattenuation of the basal ganglia or cortex to indicate infarcted tissue. No hydrocephalus or age advanced atrophy. Mild bilateral basal ganglia mineralization. Vascular: No hyperdense vessel. No advanced atherosclerotic calcification of the arteries at the skull base. Skull: Normal visualized skull base, calvarium and extracranial soft tissues. Sinuses/Orbits: No sinus fluid levels or advanced mucosal thickening. No mastoid effusion. Normal orbits. ASPECTS [facility] Stroke Program Early CT Score) - Ganglionic level infarction (caudate, lentiform nuclei, internal capsule, insula, M1-M3 cortex): 7 - Supraganglionic infarction (M4-M6 cortex): 3 Total score (0-10 with 10 being normal): 10 IMPRESSION: 1. No acute hemorrhage or mass lesion. 2. ASPECTS is 10. These results were called by telephone at the time of interpretation on 02/28/2017 at 6:59 pm to Dr. Delman Kitten , who verbally acknowledged these results. Electronically Signed    By: Ulyses Jarred M.D.   On: 02/28/2017 18:59    Review of Systems  Constitutional: Positive for diaphoresis and malaise/fatigue.  HENT: Positive for congestion.   Eyes: Positive for blurred vision.  Respiratory: Negative.   Cardiovascular: Positive for palpitations.  Gastrointestinal: Positive for nausea and vomiting.  Skin: Negative.   Neurological: Positive for dizziness, weakness and headaches.  Endo/Heme/Allergies: Negative.   Psychiatric/Behavioral: Negative.    Blood pressure 117/74, pulse 77, temperature 98 F (36.7 C), temperature source Oral, resp. rate 20, height _0  (1.727 m), weight 286 lb 11.2 oz (130 kg), SpO2 98 %. Physical Exam  Nursing note and vitals reviewed. Constitutional: He is oriented to person, place, and time. He appears  well-developed and well-nourished.  HENT:  Head: Normocephalic and atraumatic.  Eyes: Pupils are equal, round, and reactive to light. Conjunctivae and EOM are normal.  Neck: Normal range of motion. Neck supple.  Cardiovascular: Normal rate, S1 normal, S2 normal and normal pulses.  An irregularly irregular rhythm present.  Murmur heard.  Systolic murmur is present with a grade of 2/6  Respiratory: Effort normal and breath sounds normal.  GI: Soft. Bowel sounds are normal.  Musculoskeletal: Normal range of motion.  Neurological: He is alert and oriented to person, place, and time. He has normal reflexes.  Skin: Skin is warm and dry.  Psychiatric: He has a normal mood and affect.    Assessment/Plan: Possible TIA/CVA Atrial fibrillation Aortic stenosis Coronary artery disease History of coronary bypass Diabetes Obesity GERD Obstructive sleep apnea Shortness of breath . Plan Agree with admission follow-up with neurology Aspirin therapy for anticoagulation Maintain diabetes management and control Agree with hypertension control with metoprolol and Cardizem GERD management with Protonix Agree with neurology input  evaluation If deemed to be TIA or CVA with consider long-term anticoagulation  Darnell Jeschke D Javonte Elenes 03/02/2017, 8:36 AM

## 2017-03-03 LAB — HIV ANTIBODY (ROUTINE TESTING W REFLEX): HIV SCREEN 4TH GENERATION: NONREACTIVE

## 2017-03-04 DIAGNOSIS — I25810 Atherosclerosis of coronary artery bypass graft(s) without angina pectoris: Secondary | ICD-10-CM | POA: Diagnosis not present

## 2017-03-05 ENCOUNTER — Ambulatory Visit
Admission: RE | Admit: 2017-03-05 | Discharge: 2017-03-05 | Disposition: A | Discharge status: Home / Self Care | Payer: Medicare HMO | Source: Ambulatory Visit | Attending: Cardiology | Admitting: Cardiology

## 2017-03-05 ENCOUNTER — Ambulatory Visit
Admission: RE | Admit: 2017-03-05 | Discharge: 2017-03-05 | Disposition: A | Discharge status: Home / Self Care | Payer: Medicare HMO | Source: Ambulatory Visit | Attending: Internal Medicine | Admitting: Internal Medicine

## 2017-03-05 DIAGNOSIS — G459 Transient cerebral ischemic attack, unspecified: Secondary | ICD-10-CM

## 2017-03-05 DIAGNOSIS — I639 Cerebral infarction, unspecified: Secondary | ICD-10-CM | POA: Insufficient documentation

## 2017-03-05 DIAGNOSIS — R42 Dizziness and giddiness: Secondary | ICD-10-CM | POA: Diagnosis not present

## 2017-03-05 NOTE — Discharge Summary (Signed)
[facility] [place] NAME: Marcus Williams    MR#:  [id number]  DATE OF BIRTH:  [birth year]  DATE OF ADMISSION:  02/28/2017 ADMITTING PHYSICIAN: Harvie Bridge, DO  DATE OF DISCHARGE: 03/01/2017  4:10 PM  PRIMARY CARE PHYSICIAN: Idelle Crouch, MD   ADMISSION DIAGNOSIS:  Cough [R05] Vertigo [R42] Headache in front of head [R51]  DISCHARGE DIAGNOSIS:  Active Problems:   TIA (transient ischemic attack)   SECONDARY DIAGNOSIS:   Past Medical History:  Diagnosis Date  . A-fib ([place])   . Aortic stenosis, moderate 11/2015  . Arthritis   . CAD (coronary artery disease)   . Chronic systolic CHF (congestive heart failure) ([place])   . Diabetes mellitus without complication ([place])    INSULIN DEPENDENT  . GERD (gastroesophageal reflux disease)   . History of cardioversion   . Hypertension   . MI (myocardial infarction) ([place])   . OSA on CPAP   . Sepsis ([place])   . Shortness of breath dyspnea   . SVT (supraventricular tachycardia) ([facility])      ADMITTING HISTORY   HISTORY OF PRESENT ILLNESS: Marcus Williams is a 61 y.o. male with a known history of afib, AS, CAD, diastolilc/systolic CHF, DM, GERD, HTN, SVT, COPD  was in a usual state of health until 30 minutes prxperienced the onset of dizziness and diaphoresis associated with  Severe throbbing vomiting and nausea. He denied any numbness, tingling or weakness other than chronic tingling and weakness of the left lower extremity for the past year. Symptoms have all resolved except for headache. He also complains of some cough over the past week, weakness and mild dyspnea on exertion.  HOSPITAL COURSE:   * Vertigo vs Migraine CT head in ED was normal. Tele neurology was consulted in ED who suggested admission for stroke w/u. Patients symptoms resolved quickly. MRI was attempted but patient was big for the machine and also got claustrophobic. Seems unlikely to be CVA Started on meclizine Discussed  with PCP who will co ordinate Open MRI as OP  CHF, COPD, HTN, DM remained stable during hospital stay  CONSULTS OBTAINED:  Treatment Team:  Leotis Pain, MD Yolonda Kida, MD  DRUG ALLERGIES:  No Known Allergies  DISCHARGE MEDICATIONS:   Discharge Medication List as of 03/01/2017  3:47 PM    START taking these medications   Details  meclizine (ANTIVERT) 12.5 MG tablet Take 1 tablet (12.5 mg total) by mouth 3 (three) times daily as needed for dizziness., Starting Sun 03/01/2017, Normal      CONTINUE these medications which have NOT CHANGED   Details  albuterol (PROVENTIL HFA;VENTOLIN HFA) 108 (90 Base) MCG/ACT inhaler Inhale 2 puffs into the lungs every 6 (six) hours as needed for wheezing or shortness of breath., Starting 07/11/2015, Until Discontinued, Normal    aspirin 81 MG EC tablet Take 1 tablet (81 mg total) by mouth daily., Starting Mon 03/10/2016, OTC    diltiazem (CARDIZEM CD) 300 MG 24 hr capsule Take 1 capsule (300 mg total) by mouth daily., Starting 10/15/2014, Until Discontinued, Normal    docusate sodium (COLACE) 100 MG capsule Take 100 mg by mouth 2 (two) times daily., Historical Med    ferrous sulfate 325 (65 FE) MG tablet Take 1 tablet (325 mg total) by mouth daily with breakfast., Starting Fri 05/16/2016, Normal    gabapentin (NEURONTIN) 300 MG capsule Take 300 mg by mouth 2 (two) times daily., Historical Med    insulin NPH Human (  HUMULIN N,NOVOLIN N) 100 UNIT/ML injection Inject 100 Units into the skin 2 (two) times daily. , Historical Med    lisinopril (PRINIVIL,ZESTRIL) 20 MG tablet Take 20 mg by mouth daily., Historical Med    metFORMIN (GLUCOPHAGE) 1000 MG tablet Take 1,000 mg by mouth 2 (two) times daily with a meal., Until Discontinued, Historical Med    metoprolol (LOPRESSOR) 100 MG tablet Take 100 mg by mouth 2 (two) times daily., Starting Mon 10/15/2015, Historical Med    nitroGLYCERIN (NITROSTAT) 0.4 MG SL tablet Place 0.4 mg under the tongue  every 5 (five) minutes as needed for chest pain., Until Discontinued, Historical Med    pantoprazole (PROTONIX) 40 MG tablet Take 40 mg by mouth daily., Until Discontinued, Historical Med    potassium chloride 20 MEQ TBCR Take 20 mEq by mouth daily., Starting Fri 11/23/2015, Normal    simvastatin (ZOCOR) 40 MG tablet Take 40 mg by mouth at bedtime. , Until Discontinued, Historical Med    torsemide (DEMADEX) 20 MG tablet TAKE 3 TABLETS (60 MG TOTAL) BY MOUTH DAILY., Starting Wed 07/16/2016, Normal    traZODone (DESYREL) 100 MG tablet Take 100 mg by mouth at bedtime., Until Discontinued, Historical Med    umeclidinium-vilanterol (ANORO ELLIPTA) 62.5-25 MCG/INH AEPB Inhale 1 puff into the lungs daily., Starting Thu 10/02/2016, Normal        Today   VITAL SIGNS:  Blood pressure 117/74, pulse 77, temperature 98 F (36.7 C), temperature source Oral, resp. rate 20, height 5\' 8"  (1.727 m), weight 130 kg (286 lb 11.2 oz), SpO2 98 %.  I/O:  No intake or output data in the 24 hours ending 03/05/17 1006  PHYSICAL EXAMINATION:  Physical Exam  GENERAL:  61 y.o.-year-old patient lying in the bed with no acute distress.  LUNGS: Normal breath sounds bilaterally, no wheezing, rales,rhonchi or crepitation. No use of accessory muscles of respiration.  CARDIOVASCULAR: S1, S2 normal. No murmurs, rubs, or gallops.  ABDOMEN: Soft, non-tender, non-distended. Bowel sounds present. No organomegaly or mass.  NEUROLOGIC: Moves all 4 extremities. PSYCHIATRIC: The patient is alert and oriented x 3.  SKIN: No obvious rash, lesion, or ulcer.   DATA REVIEW:   CBC  Recent Labs Lab 02/28/17 1906  WBC 7.7  HGB 13.3  HCT 37.8*  PLT 142*    Chemistries   Recent Labs Lab 02/28/17 1905  NA 140  K 4.2  CL 110  CO2 21*  GLUCOSE 165*  BUN 28*  CREATININE 1.17  CALCIUM 8.8*    Cardiac Enzymes  Recent Labs Lab 03/01/17 1345  TROPONINI 0.03*    Microbiology Results  Results for orders placed  or performed during the hospital encounter of 05/04/15  Wound culture     Status: None   Collection Time: 05/05/15  2:52 PM  Result Value Ref Range Status   Specimen Description ABSCESS  Final   Special Requests NONE  Final   Gram Stain   Final    FEW WBC SEEN FEW GRAM POSITIVE COCCI FEW GRAM NEGATIVE RODS    Culture   Final    LIGHT GROWTH STAPHYLOCOCCUS AUREUS NO ANAEROBIC CULTURE ORDERED    Report Status [id number] FINAL  Final   Organism ID, Bacteria STAPHYLOCOCCUS AUREUS  Final      Susceptibility   Staphylococcus aureus - MIC*    CIPROFLOXACIN <=0.5 SENSITIVE Sensitive     GENTAMICIN <=0.5 SENSITIVE Sensitive     OXACILLIN 0.5 SENSITIVE Sensitive     TRIMETH/SULFA <=10 SENSITIVE Sensitive  CEFOXITIN SCREEN NEGATIVE Sensitive     Inducible Clindamycin NEGATIVE Sensitive     TETRACYCLINE Value in next row Sensitive      SENSITIVE<=1    * LIGHT GROWTH STAPHYLOCOCCUS AUREUS  Surgical pcr screen     Status: Abnormal   Collection Time: 05/05/15  3:07 PM  Result Value Ref Range Status   MRSA, PCR NEGATIVE NEGATIVE Final   Staphylococcus aureus POSITIVE (A) NEGATIVE Final    Comment:        The Xpert SA Assay (FDA approved for NASAL specimens in patients over 71 years of age), is one component of a comprehensive surveillance program.  Test performance has been validated by [facility] for patients greater than or equal to 67 year old. It is not intended to diagnose infection nor to guide or monitor treatment.   Culture, blood (routine x 2)     Status: None   Collection Time: 05/05/15 10:22 PM  Result Value Ref Range Status   Specimen Description BLOOD LEFT ASSIST CONTROL  Final   Special Requests BOTTLES DRAWN AEROBIC AND ANAEROBIC 4CC  Final   Culture NO GROWTH 5 DAYS  Final   Report Status 05/10/2015 FINAL  Final  Culture, blood (routine x 2)     Status: None   Collection Time: 05/05/15 10:31 PM  Result Value Ref Range Status   Specimen Description BLOOD  RIGHT ASSIST CONTROL  Final   Special Requests BOTTLES DRAWN AEROBIC AND ANAEROBIC 3CC  Final   Culture NO GROWTH 5 DAYS  Final   Report Status 05/10/2015 FINAL  Final    RADIOLOGY:  No results found.  Follow up with PCP in 1 week.  Management plans discussed with the patient, family and they are in agreement.  CODE STATUS:  Code Status History    Date Active Date Inactive Code Status Order ID Comments User Context   03/01/2017  2:03 AM 03/01/2017  8:04 PM Full Code [id number]  Hugelmeyer, Ubaldo Glassing, DO Inpatient   11/21/2015  4:20 PM 11/23/2015  5:10 PM Full Code [id number]  Sherren Mocha, MD Inpatient   05/06/2015  3:45 PM [id number]  4:09 PM Full Code [id number]  Sharlotte Alamo, MD Inpatient   05/04/2015  7:51 PM 05/06/2015  3:45 PM Full Code [id number]  Vaughan Basta, MD ED   01/14/2015  2:16 PM 01/15/2015  3:14 PM Full Code [id number]  Lance Coon, MD Inpatient   12/22/2014  6:25 PM 12/23/2014  4:12 PM Full Code [id number]  Epifanio Lesches, MD ED   10/14/2014 12:14 PM 10/15/2014  1:50 PM Full Code [id number]  Alfonso Patten, RN Inpatient    Advance Directive Documentation     Most Recent Value  Type of Advance Directive  Living will  Pre-existing out of facility DNR order (yellow form or pink MOST form)  -  "MOST" Form in Place?  -      TOTAL TIME TAKING CARE OF THIS PATIENT ON DAY OF DISCHARGE: more than 30 minutes.   Hillary Bow R M.D on 03/05/2017 at 10:06 AM  Between 7am to 6pm - Pager - [phone]  After 6pm go to [email or web address] - password [email or web address] [facility]  Office  [phone]  CC: Primary care physician; Idelle Crouch, MD  Note: This dictation was prepared with Dragon dictation along with smaller phrase technology. Any transcriptional errors that result from this process are unintentional.

## 2017-03-11 DIAGNOSIS — L97512 Non-pressure chronic ulcer of other part of right foot with fat layer exposed: Secondary | ICD-10-CM | POA: Diagnosis not present

## 2017-03-16 DIAGNOSIS — G4733 Obstructive sleep apnea (adult) (pediatric): Secondary | ICD-10-CM | POA: Diagnosis not present

## 2017-03-16 DIAGNOSIS — E1142 Type 2 diabetes mellitus with diabetic polyneuropathy: Secondary | ICD-10-CM | POA: Diagnosis not present

## 2017-03-16 DIAGNOSIS — I480 Paroxysmal atrial fibrillation: Secondary | ICD-10-CM | POA: Diagnosis not present

## 2017-03-16 DIAGNOSIS — I639 Cerebral infarction, unspecified: Secondary | ICD-10-CM | POA: Diagnosis not present

## 2017-03-16 DIAGNOSIS — Z9989 Dependence on other enabling machines and devices: Secondary | ICD-10-CM | POA: Diagnosis not present

## 2017-03-17 DIAGNOSIS — R0602 Shortness of breath: Secondary | ICD-10-CM | POA: Diagnosis not present

## 2017-03-17 DIAGNOSIS — Z23 Encounter for immunization: Secondary | ICD-10-CM | POA: Diagnosis not present

## 2017-03-17 DIAGNOSIS — I5033 Acute on chronic diastolic (congestive) heart failure: Secondary | ICD-10-CM | POA: Diagnosis not present

## 2017-03-17 DIAGNOSIS — G4733 Obstructive sleep apnea (adult) (pediatric): Secondary | ICD-10-CM | POA: Diagnosis not present

## 2017-03-17 DIAGNOSIS — R0789 Other chest pain: Secondary | ICD-10-CM | POA: Diagnosis not present

## 2017-03-17 DIAGNOSIS — I63541 Cerebral infarction due to unspecified occlusion or stenosis of right cerebellar artery: Secondary | ICD-10-CM | POA: Diagnosis not present

## 2017-03-17 DIAGNOSIS — I480 Paroxysmal atrial fibrillation: Secondary | ICD-10-CM | POA: Diagnosis not present

## 2017-03-17 DIAGNOSIS — Z9989 Dependence on other enabling machines and devices: Secondary | ICD-10-CM | POA: Diagnosis not present

## 2017-03-18 ENCOUNTER — Other Ambulatory Visit: Payer: Self-pay | Admitting: Internal Medicine

## 2017-03-18 DIAGNOSIS — R0789 Other chest pain: Secondary | ICD-10-CM

## 2017-03-18 DIAGNOSIS — R0602 Shortness of breath: Secondary | ICD-10-CM

## 2017-03-24 ENCOUNTER — Emergency Department: Payer: Medicare HMO

## 2017-03-24 ENCOUNTER — Emergency Department
Admission: EM | Admit: 2017-03-24 | Discharge: 2017-03-24 | Disposition: A | Discharge status: Home / Self Care | Payer: Medicare HMO | Attending: Emergency Medicine | Admitting: Emergency Medicine

## 2017-03-24 ENCOUNTER — Other Ambulatory Visit: Payer: Self-pay

## 2017-03-24 DIAGNOSIS — Z7901 Long term (current) use of anticoagulants: Secondary | ICD-10-CM | POA: Insufficient documentation

## 2017-03-24 DIAGNOSIS — R1012 Left upper quadrant pain: Secondary | ICD-10-CM | POA: Insufficient documentation

## 2017-03-24 DIAGNOSIS — Z7982 Long term (current) use of aspirin: Secondary | ICD-10-CM | POA: Insufficient documentation

## 2017-03-24 DIAGNOSIS — Z794 Long term (current) use of insulin: Secondary | ICD-10-CM | POA: Diagnosis not present

## 2017-03-24 DIAGNOSIS — E119 Type 2 diabetes mellitus without complications: Secondary | ICD-10-CM | POA: Diagnosis not present

## 2017-03-24 DIAGNOSIS — I509 Heart failure, unspecified: Secondary | ICD-10-CM | POA: Insufficient documentation

## 2017-03-24 DIAGNOSIS — I2510 Atherosclerotic heart disease of native coronary artery without angina pectoris: Secondary | ICD-10-CM | POA: Diagnosis not present

## 2017-03-24 DIAGNOSIS — R0602 Shortness of breath: Secondary | ICD-10-CM | POA: Insufficient documentation

## 2017-03-24 DIAGNOSIS — R001 Bradycardia, unspecified: Secondary | ICD-10-CM

## 2017-03-24 DIAGNOSIS — R109 Unspecified abdominal pain: Secondary | ICD-10-CM | POA: Diagnosis not present

## 2017-03-24 DIAGNOSIS — I110 Hypertensive heart disease with heart failure: Secondary | ICD-10-CM | POA: Insufficient documentation

## 2017-03-24 DIAGNOSIS — I252 Old myocardial infarction: Secondary | ICD-10-CM | POA: Diagnosis not present

## 2017-03-24 DIAGNOSIS — Z79899 Other long term (current) drug therapy: Secondary | ICD-10-CM | POA: Diagnosis not present

## 2017-03-24 DIAGNOSIS — J449 Chronic obstructive pulmonary disease, unspecified: Secondary | ICD-10-CM | POA: Diagnosis not present

## 2017-03-24 DIAGNOSIS — Z8673 Personal history of transient ischemic attack (TIA), and cerebral infarction without residual deficits: Secondary | ICD-10-CM | POA: Insufficient documentation

## 2017-03-24 HISTORY — DX: Cerebral infarction, unspecified: I63.9

## 2017-03-24 LAB — TROPONIN I: Troponin I: 0.03 ng/mL (ref <0.03)

## 2017-03-24 LAB — CBC
HEMATOCRIT: 36.6 % — AB (ref 40.0–52.0)
Hemoglobin: 12.6 g/dL — ABNORMAL LOW (ref 13.0–18.0)
MCH: 31 pg (ref 26.0–34.0)
MCHC: 34.5 g/dL (ref 32.0–36.0)
MCV: 89.6 fL (ref 80.0–100.0)
PLATELETS: 140 10*3/uL — AB (ref 150–440)
RBC: 4.08 MIL/uL — ABNORMAL LOW (ref 4.40–5.90)
RDW: 13.8 % (ref 11.5–14.5)
WBC: 6.5 10*3/uL (ref 3.8–10.6)

## 2017-03-24 LAB — COMPREHENSIVE METABOLIC PANEL
ALBUMIN: 4 g/dL (ref 3.5–5.0)
ALK PHOS: 49 U/L (ref 38–126)
ALT: 39 U/L (ref 17–63)
AST: 42 U/L — AB (ref 15–41)
Anion gap: 9 (ref 5–15)
BILIRUBIN TOTAL: 0.5 mg/dL (ref 0.3–1.2)
BUN: 31 mg/dL — AB (ref 6–20)
CO2: 23 mmol/L (ref 22–32)
CREATININE: 1.41 mg/dL — AB (ref 0.61–1.24)
Calcium: 8.8 mg/dL — ABNORMAL LOW (ref 8.9–10.3)
Chloride: 105 mmol/L (ref 101–111)
GFR calc Af Amer: >60 mL/min (ref >60)
GFR, EST NON AFRICAN AMERICAN: 52 mL/min — AB (ref >60)
GLUCOSE: 160 mg/dL — AB (ref 65–99)
POTASSIUM: 4.7 mmol/L (ref 3.5–5.1)
Sodium: 137 mmol/L (ref 135–145)
TOTAL PROTEIN: 7 g/dL (ref 6.5–8.1)

## 2017-03-24 LAB — LIPASE, BLOOD: Lipase: 31 U/L (ref 11–51)

## 2017-03-24 LAB — BRAIN NATRIURETIC PEPTIDE: B Natriuretic Peptide: 234 pg/mL — ABNORMAL HIGH (ref 0.0–100.0)

## 2017-03-24 MED ORDER — FUROSEMIDE 10 MG/ML IJ SOLN
80.0000 mg | Freq: Once | INTRAMUSCULAR | Status: AC
  Administered 2017-03-24: 80 mg via INTRAVENOUS
  Filled 2017-03-24 (×2): qty 8

## 2017-03-24 NOTE — ED Notes (Signed)
Pt ambulated from room 33 to room 18 and back. Spo2 was 98% RA afterwards. Pt still talking in full sentences.

## 2017-03-24 NOTE — Discharge Instructions (Signed)
we believe your shortness of breath is most likely caused by the gradual development of some fluid in your lungs ( congestive heart failure) in spite of the water pill that you take.  We discussed it and you would like to follow up with the heart failure clinic for a second opinion and we think that is very appropriate.  The clinic should recheck to you to schedule a follow-up appointment, but if you have not heard from them in the next couple of days, please call them at the number provided and explain you would like to follow-up from your emergency department visit.  please continue using all of your regular medications and avoid salty foods.    Return to the emergency department if you develop new or worsening symptoms that concern you.

## 2017-03-24 NOTE — ED Triage Notes (Signed)
Pt c/o SOB with walking and left sided abdominal pain X 2-3days.

## 2017-03-24 NOTE — ED Notes (Signed)
Date and time results received: 03/24/17 1719 (use smartphrase ".now" to insert current time)  Test: troponin Critical Value: 0.03  Name of Provider Notified: forbach

## 2017-03-24 NOTE — ED Notes (Signed)
Patient transported to CT 

## 2017-03-24 NOTE — ED Provider Notes (Signed)
[facility] Department Provider Note  ____________________________________________   First MD Initiated Contact with Patient 03/24/17 1633     (approximate)  I have reviewed the triage vital signs and the nursing notes.   HISTORY  Chief Complaint Shortness of Breath and Abdominal Pain    HPI Marcus Williams is a 61 y.o. male With an extensive past medical history including severe cardiac disease, CHF, and a recent CVA now on Eliquis who presents for evaluation of gradually worsening shortness of breath over the last 2-3 days.  He is also having left sided abdominal pain, but in his words, he does not care about that, but his breathing worries him.  His breathing gets much worse with even a small amount of exertion such as getting up and walking across a small room. he uses a CPAP at night but even that has not been helping and he definitely cannot lie down flat.  He takes torsemide 60 mg every morning and has been compliant with his medications.  He has not noticed any significant swelling in his legs.  He denies chest pain, fever/chills, nausea, vomiting, and dysuria.  He describes the pain in the left side of his abdomen as sharp and stabbing "like a knife".  It has been present for several days as well and moving around and touching the side of his abdomen makes it worse.  His cardiologist is Dr. Ubaldo Glassing and his primary care doctor is Dr. Doy Hutching.   Past Medical History:  Diagnosis Date  . A-fib ([place])   . Aortic stenosis, moderate 11/2015  . Arthritis   . CAD (coronary artery disease)   . Chronic systolic CHF (congestive heart failure) ([place])   . Diabetes mellitus without complication ([place])    INSULIN DEPENDENT  . GERD (gastroesophageal reflux disease)   . History of cardioversion   . Hypertension   . MI (myocardial infarction) ([place])   . OSA on CPAP   . Sepsis ([place])   . Shortness of breath dyspnea   . Stroke ([place])   . SVT (supraventricular  tachycardia) [facility])     Patient Active Problem List   Diagnosis Date Noted  . TIA (transient ischemic attack) 03/01/2017  . Aortic stenosis 11/21/2015  . Acute on chronic diastolic CHF (congestive heart failure), NYHA class 3 ([place]) 11/21/2015  . Aortic valve, bicuspid 09/24/2015  . Cellulitis 05/04/2015  . Acute respiratory failure with hypoxia ([place]) 03/26/2015  . CHF (congestive heart failure) ([place]) 01/15/2015  . HTN (hypertension) 01/14/2015  . Type II diabetes mellitus ([place]) 01/14/2015  . COPD (chronic obstructive pulmonary disease) ([place]) 01/14/2015  . OSA on CPAP 01/14/2015  . CAD (coronary artery disease) 01/14/2015  . A-fib ([place]) 01/14/2015  . Pain of left calf 01/14/2015  . Acute on chronic systolic CHF (congestive heart failure) ([place]) 01/14/2015  . COPD exacerbation ([place]) 12/22/2014  . Diabetes ([place]) 10/14/2014    Past Surgical History:  Procedure Laterality Date  . 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 BUNIONECTOMY    . CARDIAC CATHETERIZATION N/A 10/02/2015   Procedure: Coronary/Grafts Angiography;  Surgeon: Teodoro Spray, MD;  Location: [facility];  Service: Cardiovascular;  Laterality: N/A;  . CARDIAC CATHETERIZATION N/A 11/21/2015   Procedure: Right and Left Heart Cath;  Surgeon: Sherren Mocha, MD;  Location: [facility];  Service: Cardiovascular;  Laterality: N/A;  .  CORONARY ARTERY BYPASS GRAFT    . KNEE SURGERY    . quadruple bypass    . TEE WITHOUT CARDIOVERSION  11/21/2015  . TEE WITHOUT CARDIOVERSION N/A 11/21/2015   Procedure: TRANSESOPHAGEAL ECHOCARDIOGRAM (TEE);  Surgeon: Larey Dresser, MD;  Location: [facility];  Service: Cardiovascular;  Laterality: N/A;    Prior to Admission medications   Medication Sig Start Date End Date Taking? Authorizing Provider  albuterol (PROVENTIL HFA;VENTOLIN HFA) 108 (90 Base) MCG/ACT inhaler  Inhale 2 puffs into the lungs every 6 (six) hours as needed for wheezing or shortness of breath. 07/11/15   Flora Lipps, MD  aspirin 81 MG EC tablet Take 1 tablet (81 mg total) by mouth daily. 03/10/16   Sherren Mocha, MD  diltiazem (CARDIZEM CD) 300 MG 24 hr capsule Take 1 capsule (300 mg total) by mouth daily. 10/15/14   Idelle Crouch, MD  docusate sodium (COLACE) 100 MG capsule Take 100 mg by mouth 2 (two) times daily.    [provider]  ferrous sulfate 325 (65 FE) MG tablet Take 1 tablet (325 mg total) by mouth daily with breakfast. 05/16/16   Clegg, Amy D, NP  gabapentin (NEURONTIN) 300 MG capsule Take 300 mg by mouth 2 (two) times daily.    [provider]  insulin NPH Human (HUMULIN N,NOVOLIN N) 100 UNIT/ML injection Inject 100 Units into the skin 2 (two) times daily.     [provider]  lisinopril (PRINIVIL,ZESTRIL) 20 MG tablet Take 20 mg by mouth daily.    [provider]  meclizine (ANTIVERT) 12.5 MG tablet Take 1 tablet (12.5 mg total) by mouth 3 (three) times daily as needed for dizziness. 03/01/17   Hillary Bow, MD  metFORMIN (GLUCOPHAGE) 1000 MG tablet Take 1,000 mg by mouth 2 (two) times daily with a meal.    [provider]  metoprolol (LOPRESSOR) 100 MG tablet Take 100 mg by mouth 2 (two) times daily. 10/15/15   [provider]  nitroGLYCERIN (NITROSTAT) 0.4 MG SL tablet Place 0.4 mg under the tongue every 5 (five) minutes as needed for chest pain.    [provider]  pantoprazole (PROTONIX) 40 MG tablet Take 40 mg by mouth daily.    [provider]  potassium chloride 20 MEQ TBCR Take 20 mEq by mouth daily. 11/23/15   Shirley Friar, PA-C  simvastatin (ZOCOR) 40 MG tablet Take 40 mg by mouth at bedtime.     [provider]  torsemide (DEMADEX) 20 MG tablet TAKE 3 TABLETS (60 MG TOTAL) BY MOUTH DAILY. 07/16/16   Shirley Friar, PA-C  traZODone (DESYREL) 100 MG tablet Take 100 mg by  mouth at bedtime.    [provider]  umeclidinium-vilanterol (ANORO ELLIPTA) 62.5-25 MCG/INH AEPB Inhale 1 puff into the lungs daily. 10/02/16   Flora Lipps, MD    Allergies Patient has no known allergies.  Family History  Problem Relation Age of Onset  . Stroke Mother   . Heart attack Mother   . Hypertension Mother   . Heart attack Father   . Hypertension Father   . Heart attack Brother        #1  . Diabetes Brother        #1  . Heart disease Brother        #2  . Lung disease Brother        #2  . Hypertension Brother        #2  . Diabetes Brother        #  2    Social History Social History  Substance Use Topics  . Smoking status: Never Smoker  . Smokeless tobacco: Never Used  . Alcohol use No    Review of Systems Constitutional: No fever/chills Eyes: No visual changes. ENT: No sore throat. Cardiovascular: Denies chest pain  Respiratory: gradually worsening shortness of breath the last several days, now severe Gastrointestinal: sharp left-sided abdominal pain.  No nausea, no vomiting.  No diarrhea.  No constipation. Genitourinary: Negative for dysuria. Musculoskeletal: Negative for neck pain.  Negative for back pain. Integumentary: Negative for rash. Neurological: Negative for headaches, focal weakness or numbness.   ____________________________________________   PHYSICAL EXAM:  VITAL SIGNS: ED Triage Vitals  Enc Vitals Group     BP 03/24/17 1524 (!) 126/48     Pulse Rate 03/24/17 1524 (!) 49     Resp 03/24/17 1524 18     Temp 03/24/17 1524 98 F (36.7 C)     Temp Source 03/24/17 1524 Oral     SpO2 03/24/17 1524 96 %     Weight 03/24/17 1525 129.3 kg (285 lb)     Height 03/24/17 1525 1.727 m (5\' 8" )     Head Circumference --      Peak Flow --      Pain Score 03/24/17 1524 10     Pain Loc --      Pain Edu? --      Excl. in [place]? --     Constitutional: Alert and oriented. mild respiratory distress Eyes: Conjunctivae are normal.  Head:  Atraumatic. Nose: No congestion/rhinnorhea. Mouth/Throat: Mucous membranes are moist. Neck: No stridor.  No meningeal signs.   Cardiovascular: bradycardia, regular rhythm. Good peripheral circulation. Grossly normal heart sounds. Respiratory: increased respiratory effort with some intercostal muscle retractions.  Audible wheezing without stethoscope, but auscultated lung sounds are relatively normal but limited by body habitus.  No end-expiratory wheezing. of note, the patient becomes severely short of breath with even a small amount of ambulation Gastrointestinal: morbid obesity. Soft with tenderness to palpation throughout the left side of his abdomen but no rebound and no guarding Musculoskeletal: trace pitting edema in bilateral lower extremities with some chronic skin changes but no evidence of acute infection. No gross deformities of extremities. Neurologic:  Normal speech and language. No gross focal neurologic deficits are appreciated.  Skin:  Skin is warm, dry and intact. No rash noted. Psychiatric: Mood and affect are normal. Speech and behavior are normal.  ____________________________________________   LABS (all labs ordered are listed, but only abnormal results are displayed)  Labs Reviewed  COMPREHENSIVE METABOLIC PANEL - Abnormal; Notable for the following:       Result Value   Glucose, Bld 160 (*)    BUN 31 (*)    Creatinine, Ser 1.41 (*)    Calcium 8.8 (*)    AST 42 (*)    GFR calc non Af Amer 52 (*)    All other components within normal limits  CBC - Abnormal; Notable for the following:    RBC 4.08 (*)    Hemoglobin 12.6 (*)    HCT 36.6 (*)    Platelets 140 (*)    All other components within normal limits  BRAIN NATRIURETIC PEPTIDE - Abnormal; Notable for the following:    B Natriuretic Peptide 234.0 (*)    All other components within normal limits  TROPONIN I - Abnormal; Notable for the following:    Troponin I 0.03 (*)    All other components  within normal  limits  LIPASE, BLOOD  URINALYSIS, COMPLETE (UACMP) WITH MICROSCOPIC   ____________________________________________  EKG  ED ECG REPORT #1 I, Magnolia Mattila, the attending physician, personally viewed and interpreted this ECG.  Date: 03/24/2017 EKG Time: 15:29 Rate: 47 Rhythm: bradycardia with first-degree AV block and incomplete right bundle branch block and LVH QRS Axis: normal Intervals: normal ST/T Wave abnormalities: Non-specific ST segment / T-wave changes, but no evidence of acute ischemia. Narrative Interpretation: no evidence of acute ischemia   ED ECG REPORT #2 I, Kleo Dungee, the attending physician, personally viewed and interpreted this ECG.  Date: 03/24/2017 EKG Time: 17:35 Rate: 47 Rhythm: Sinus bradycardia with first-degree AV block and incomplete right bundle branch block and LVH QRS Axis: normal Intervals: normal ST/T Wave abnormalities: Non-specific ST segment / T-wave changes, but no evidence of acute ischemia. Narrative Interpretation: no evidence of acute ischemia    ____________________________________________  RADIOLOGY   Ct Abdomen Pelvis Wo Contrast  Result Date: 03/24/2017 CLINICAL DATA:  Shortness of breath, acute left-sided abdominal pain. EXAM: CT CHEST, ABDOMEN AND PELVIS WITHOUT CONTRAST TECHNIQUE: Multidetector CT imaging of the chest, abdomen and pelvis was performed following the standard protocol without IV contrast. COMPARISON:  CT scan of July 19, 2016. FINDINGS: CT CHEST FINDINGS Cardiovascular: Atherosclerosis of thoracic aorta is noted without aneurysm formation. Status post coronary artery bypass graft. No pericardial effusion is noted. Mediastinum/Nodes: No enlarged mediastinal, hilar, or axillary lymph nodes. Thyroid gland, trachea, and esophagus demonstrate no significant findings. Lungs/Pleura: No pneumothorax or pleural effusion is noted. Stable mild bilateral lung scarring is noted. Musculoskeletal: No chest wall mass or  suspicious bone lesions identified. CT ABDOMEN PELVIS FINDINGS Hepatobiliary: No focal liver abnormality is seen. No gallstones, gallbladder wall thickening, or biliary dilatation. Pancreas: Unremarkable. No pancreatic ductal dilatation or surrounding inflammatory changes. Spleen: Normal in size without focal abnormality. Adrenals/Urinary Tract: Adrenal glands are unremarkable. Kidneys are normal, without renal calculi, focal lesion, or hydronephrosis. Bladder is unremarkable. Stomach/Bowel: Stomach is within normal limits. Appendix appears normal. No evidence of bowel wall thickening, distention, or inflammatory changes. Vascular/Lymphatic: Aortic atherosclerosis. No enlarged abdominal or pelvic lymph nodes. Reproductive: Prostate is unremarkable. Other: Mild bilateral inguinal adenopathy is noted which most likely is reactive in etiology. No abnormal fluid collection is noted. Musculoskeletal: No acute or significant osseous findings. IMPRESSION: Aortic atherosclerosis. No significant abnormality seen in the chest, abdomen or pelvis. Electronically Signed   By: Marijo Conception, M.D.   On: 03/24/2017 17:29   Dg Chest 2 View  Result Date: 03/24/2017 CLINICAL DATA:  Shortness of breath. EXAM: CHEST  2 VIEW COMPARISON:  Chest x-ray dated March 01, 2017. FINDINGS: Postsurgical changes related to prior CABG. Fracture of the superior most first through third sternal wires again noted. The cardiomediastinal silhouette is normal in size. Mild pulmonary vascular congestion. Scattered areas of linear atelectasis/scarring are similar to prior study. No focal consolidation, pleural effusion, or pneumothorax. No acute osseous abnormality. IMPRESSION: Prior CABG.  Mild pulmonary vascular congestion without overt edema. Electronically Signed   By: Titus Dubin M.D.   On: 03/24/2017 16:02   Ct Chest Wo Contrast  Result Date: 03/24/2017 CLINICAL DATA:  Shortness of breath, acute left-sided abdominal pain. EXAM: CT  CHEST, ABDOMEN AND PELVIS WITHOUT CONTRAST TECHNIQUE: Multidetector CT imaging of the chest, abdomen and pelvis was performed following the standard protocol without IV contrast. COMPARISON:  CT scan of July 19, 2016. FINDINGS: CT CHEST FINDINGS Cardiovascular: Atherosclerosis of thoracic aorta is noted without aneurysm  formation. Status post coronary artery bypass graft. No pericardial effusion is noted. Mediastinum/Nodes: No enlarged mediastinal, hilar, or axillary lymph nodes. Thyroid gland, trachea, and esophagus demonstrate no significant findings. Lungs/Pleura: No pneumothorax or pleural effusion is noted. Stable mild bilateral lung scarring is noted. Musculoskeletal: No chest wall mass or suspicious bone lesions identified. CT ABDOMEN PELVIS FINDINGS Hepatobiliary: No focal liver abnormality is seen. No gallstones, gallbladder wall thickening, or biliary dilatation. Pancreas: Unremarkable. No pancreatic ductal dilatation or surrounding inflammatory changes. Spleen: Normal in size without focal abnormality. Adrenals/Urinary Tract: Adrenal glands are unremarkable. Kidneys are normal, without renal calculi, focal lesion, or hydronephrosis. Bladder is unremarkable. Stomach/Bowel: Stomach is within normal limits. Appendix appears normal. No evidence of bowel wall thickening, distention, or inflammatory changes. Vascular/Lymphatic: Aortic atherosclerosis. No enlarged abdominal or pelvic lymph nodes. Reproductive: Prostate is unremarkable. Other: Mild bilateral inguinal adenopathy is noted which most likely is reactive in etiology. No abnormal fluid collection is noted. Musculoskeletal: No acute or significant osseous findings. IMPRESSION: Aortic atherosclerosis. No significant abnormality seen in the chest, abdomen or pelvis. Electronically Signed   By: Marijo Conception, M.D.   On: 03/24/2017 17:29    ____________________________________________   PROCEDURES  Critical Care performed: No   Procedure(s)  performed:   Procedures   ____________________________________________   INITIAL IMPRESSION / ASSESSMENT AND PLAN / ED COURSE  As part of my medical decision making, I reviewed the following data within the [facility] notes reviewed and incorporated, Labs reviewed , EKG interpreted , Old EKG reviewed and Old chart reviewed    the patient has had A. fib in the past but now is in sinus bradycardia with first-degree AV block.  He has pulmonary vascular congestion on his chest x-ray but he is presenting like one would expect for someone with overt pulmonary edema.  He is having no chest pain and there is no evidence of ischemia on his EKG.  I have added on a BNP and a troponin to his lab work. I reviewed the lab work that has been completed so far and his CBC is unremarkable, his metabolic panel is notable for an elevated BUN of 31 and an elevated creatinine of 1.4 over his baseline which seems to be about 1.1 and 1.2.  I discussed with him the fact that I think he is having a CHF exacerbation in spite of his torsemide, but given the tenderness to palpation of his abdomen, I will obtain a CT scan of his chest, abdomen, and pelvis with no contrast to look for any gross/acute abnormalities.  I anticipate he may require admission for CHF treatment given how dyspneic he is with any amount of exertion.  He agrees with plan.  Differential includes, but is not limited to, viral syndrome, bronchitis including COPD exacerbation, pneumonia, reactive airway disease including asthma, CHF including exacerbation with or without pulmonary/interstitial edema, pneumothorax, ACS, thoracic trauma, and pulmonary embolism, but I think CHF is by far the most likely diagnosis at this point.  Clinical Course as of Mar 24 2028  Tue Mar 24, 2017  1822 the patient continues to have no chest pain.  We ambulated him around the emergency department with a pulse oximeter on his finger and he did not drop  below 98% and never became short of breath to the point he could not speak in complete sentences.  His BNP is slightly elevated. his troponin is 0.03, but it was also 0.03 about 3 weeks ago on the last time  he had his blood work checked and it is consistent with a mild CHF exacerbation.  He has bradycardia but it is consistent and asymptomatic given that he never becomes lightheaded or dizzy with ambulation.  In fact he is walking back and forth to the bathroom without any difficulty at this point.  His creatinine and BUN are slightly up.  I discussed admission with him versus going home but at this point I think that close outpatient follow-up would be appropriate and he strongly agrees; he informs me that his wife is bound to a wheelchair and that he needs to take care of her and less he absolutely must stay in the hospital.  Under the circumstances and given the relatively reassuring workup he has had today, I think close outpatient follow-up is appropriate.  I gave him the option of following up with Dr. Ubaldo Glassing were going to the heart failure clinic, and he would like to get the second opinion of the heart failure clinic so I sent a discharge order for follow-up with [facility]. I gave him an extra dose of Lasix 80 mg IV given that he is on torsemide 60 mg by mouth daily (it is not equivalent, but it should be a start towards extra diuresis).  I stressed to him it is very important he follow-up within a day or 2 if possible to repeat lab work as well as to reevaluate him clinically.  I gave my usual and customary return precautions.  He understands and agrees with the plan.  [CF]  [id number] no acute findings were present on the CT of the chest nor abdomen/pelvis  [CF]    Clinical Course User Index [CF] Hinda Kehr, MD    ____________________________________________  FINAL CLINICAL IMPRESSION(S) / ED DIAGNOSES  Final diagnoses:  Shortness of breath  Acute on chronic congestive heart failure, unspecified  heart failure type ([place])  Left upper quadrant pain  Bradycardia     MEDICATIONS GIVEN DURING THIS VISIT:  Medications  furosemide (LASIX) injection 80 mg (80 mg Intravenous Given 03/24/17 1822)     NEW OUTPATIENT MEDICATIONS STARTED DURING THIS VISIT:  Discharge Medication List as of 03/24/2017  6:21 PM      Discharge Medication List as of 03/24/2017  6:21 PM      Discharge Medication List as of 03/24/2017  6:21 PM       Note:  This document was prepared using Dragon voice recognition software and may include unintentional dictation errors.    Hinda Kehr, MD 03/24/17 2029

## 2017-03-30 ENCOUNTER — Telehealth: Payer: Self-pay

## 2017-03-30 NOTE — Telephone Encounter (Signed)
-----   Message from Alisa Graff,  sent at 03/26/2017  2:15 PM EDT ----- Regarding: Please call Contact: [phone] Was in the ED 03/24/17

## 2017-03-30 NOTE — Telephone Encounter (Signed)
Attempted to make appointment after Ed visit. No answer message left.

## 2017-03-31 NOTE — Telephone Encounter (Signed)
Pt left voicemail on office machine after hours. Attempted to reach patient again today but was unsuccessful. Message left on voicemail.

## 2017-04-01 NOTE — Telephone Encounter (Signed)
Third attempt to reach patient. Left voicemail to contact office.

## 2017-04-06 DIAGNOSIS — R109 Unspecified abdominal pain: Secondary | ICD-10-CM | POA: Diagnosis not present

## 2017-04-06 DIAGNOSIS — Z86010 Personal history of colonic polyps: Secondary | ICD-10-CM | POA: Diagnosis not present

## 2017-04-10 DIAGNOSIS — L97512 Non-pressure chronic ulcer of other part of right foot with fat layer exposed: Secondary | ICD-10-CM | POA: Diagnosis not present

## 2017-04-15 DIAGNOSIS — I480 Paroxysmal atrial fibrillation: Secondary | ICD-10-CM | POA: Diagnosis not present

## 2017-04-15 DIAGNOSIS — G4733 Obstructive sleep apnea (adult) (pediatric): Secondary | ICD-10-CM | POA: Diagnosis not present

## 2017-04-15 DIAGNOSIS — Z9989 Dependence on other enabling machines and devices: Secondary | ICD-10-CM | POA: Diagnosis not present

## 2017-04-15 DIAGNOSIS — R51 Headache: Secondary | ICD-10-CM | POA: Diagnosis not present

## 2017-04-15 DIAGNOSIS — I63541 Cerebral infarction due to unspecified occlusion or stenosis of right cerebellar artery: Secondary | ICD-10-CM | POA: Diagnosis not present

## 2017-05-06 DIAGNOSIS — L97511 Non-pressure chronic ulcer of other part of right foot limited to breakdown of skin: Secondary | ICD-10-CM | POA: Diagnosis not present

## 2017-05-06 DIAGNOSIS — Z794 Long term (current) use of insulin: Secondary | ICD-10-CM | POA: Diagnosis not present

## 2017-05-06 DIAGNOSIS — E1140 Type 2 diabetes mellitus with diabetic neuropathy, unspecified: Secondary | ICD-10-CM | POA: Diagnosis not present

## 2017-05-06 DIAGNOSIS — B351 Tinea unguium: Secondary | ICD-10-CM | POA: Diagnosis not present

## 2017-05-16 IMAGING — CT CT CHEST W/O CM
1 series · 15 of 34 positions shown, 19 images · non-contrast
Comparison: CT angio chest of 05/17/2015

CLINICAL DATA: Atypical chest pain, shortness of breath, left arm
numbness for 3 weeks

EXAM:
CT CHEST WITHOUT CONTRAST
TECHNIQUE: Multidetector CT imaging of the chest was performed following the
standard protocol without IV contrast.

[Series 2: routine chest wo · axial · 0.79mm/px · z∈[-567,-295]mm · 15 of 160 slices shown, 19 images]
[im 12/160  mediastinal]
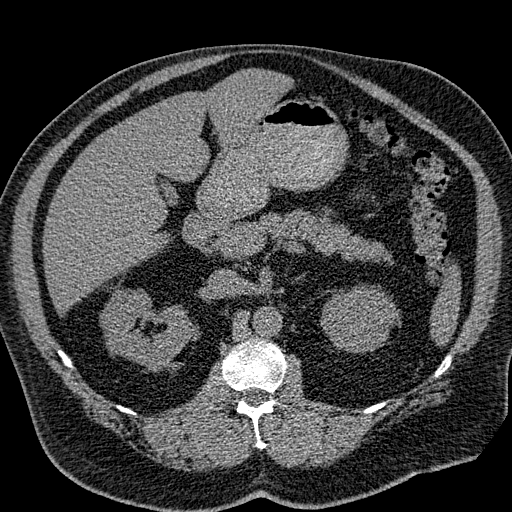
[im 12/160  lung]
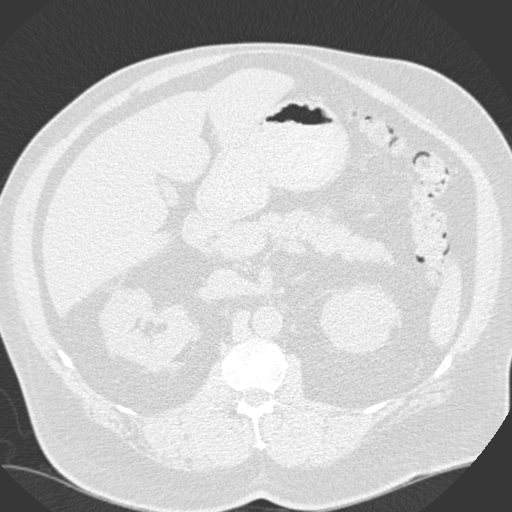
[im 24/160  lung]
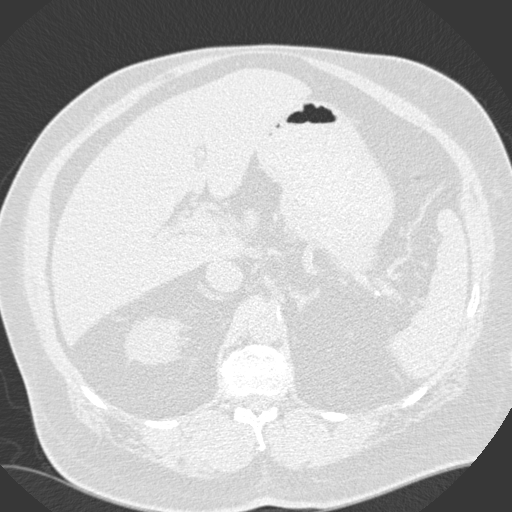
[im 32/160  lung]
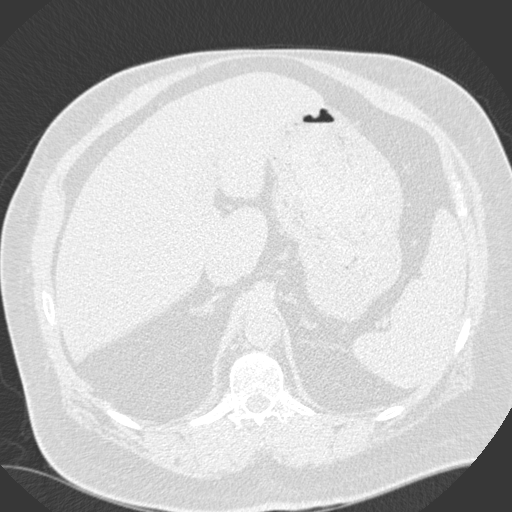
[im 42/160  lung]
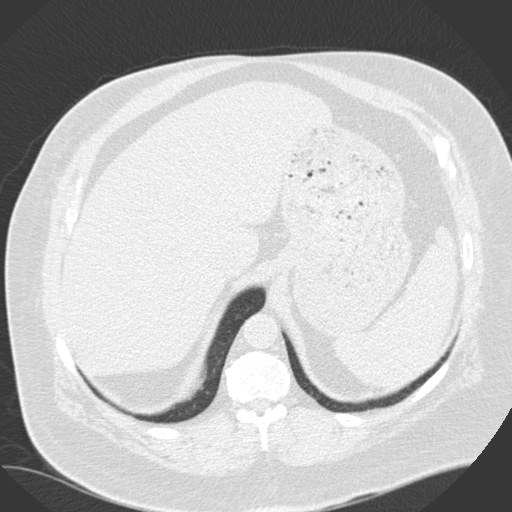
[im 54/160  mediastinal]
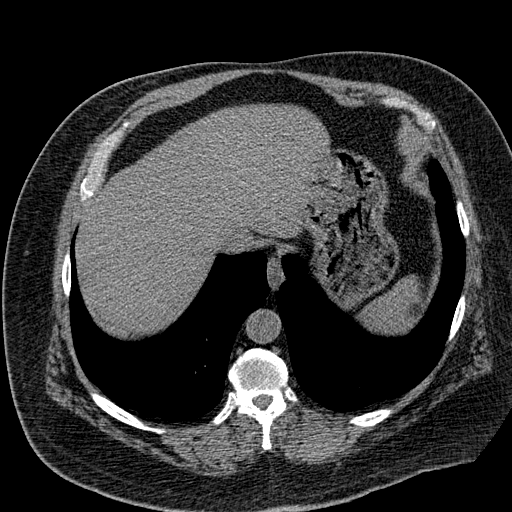
[im 54/160  lung]
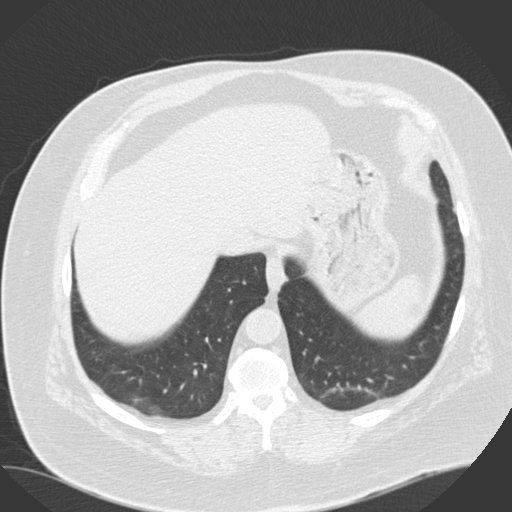
[im 64/160  lung]
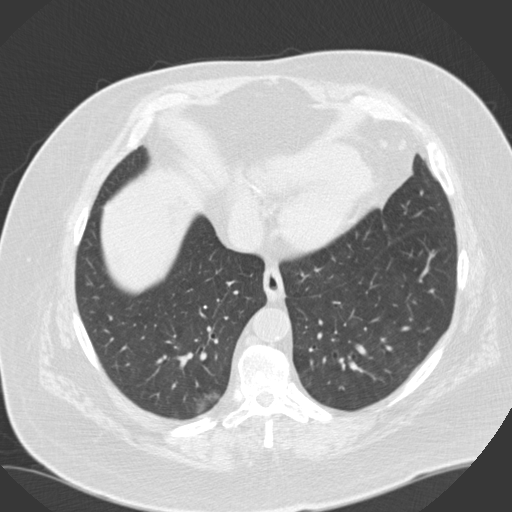
[im 71/160  lung]
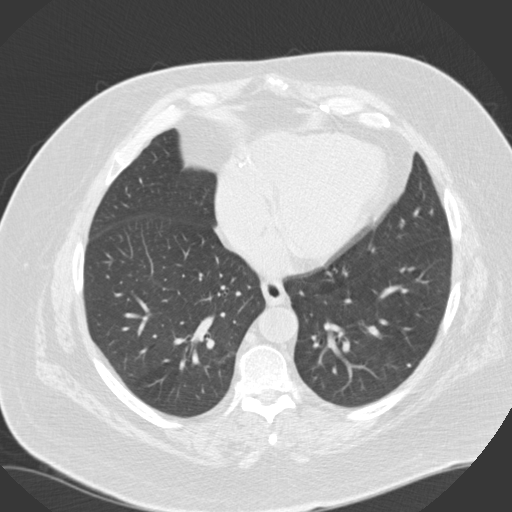
[im 83/160  lung]
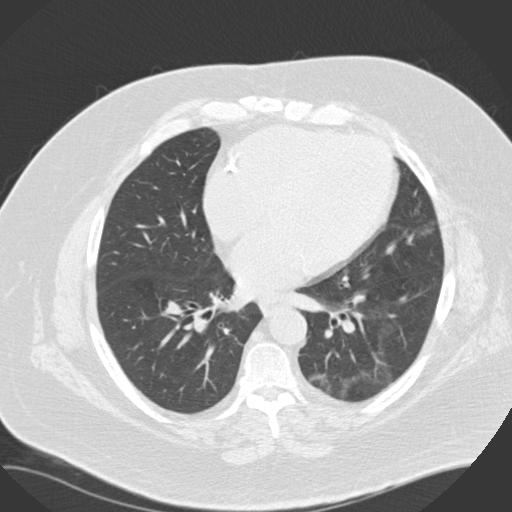
[im 89/160  mediastinal]
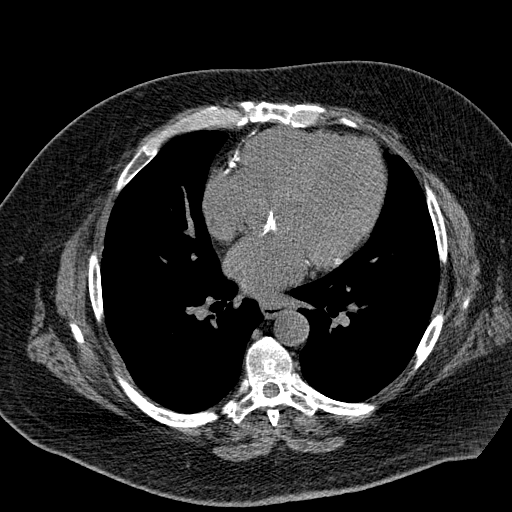
[im 89/160  lung]
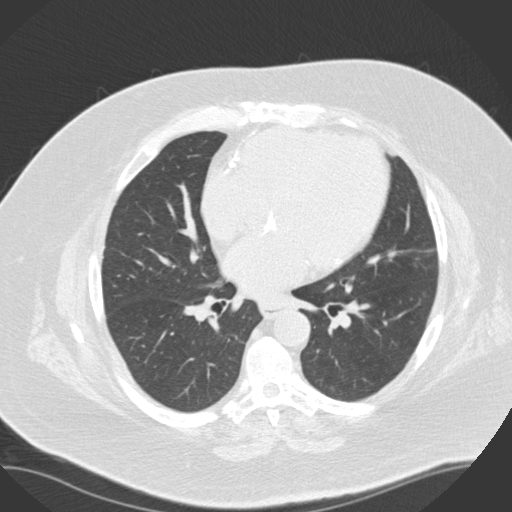
[im 96/160  lung]
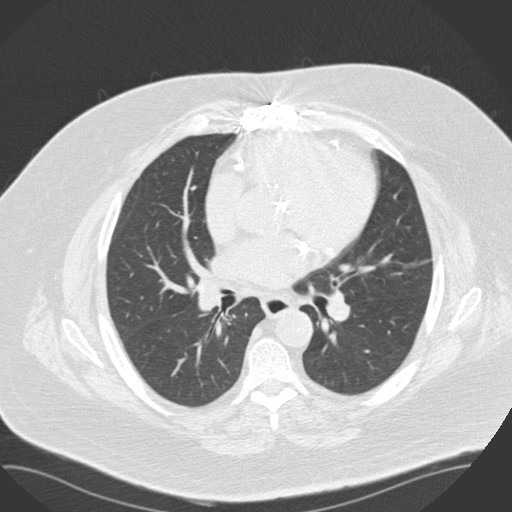
[im 107/160  lung]
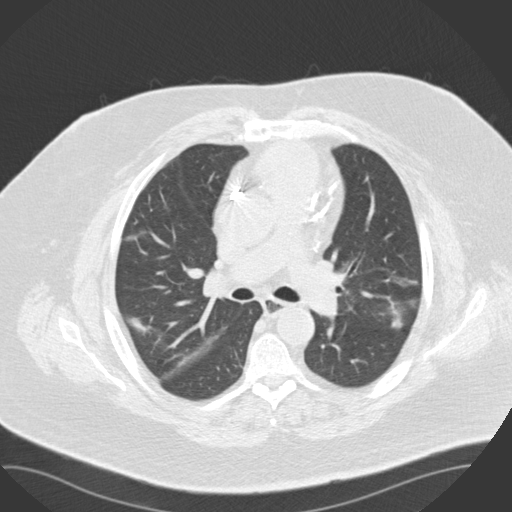
[im 118/160  lung]
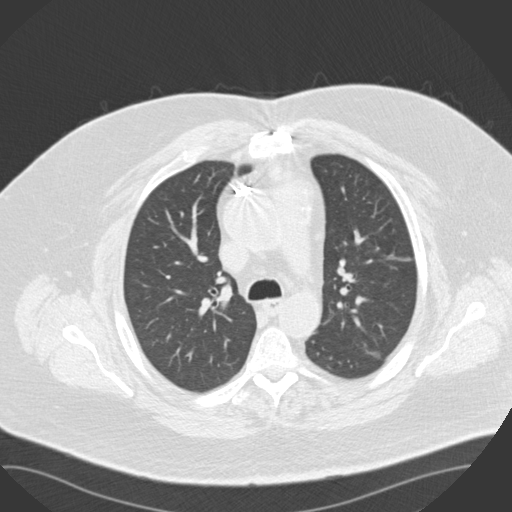
[im 128/160  mediastinal]
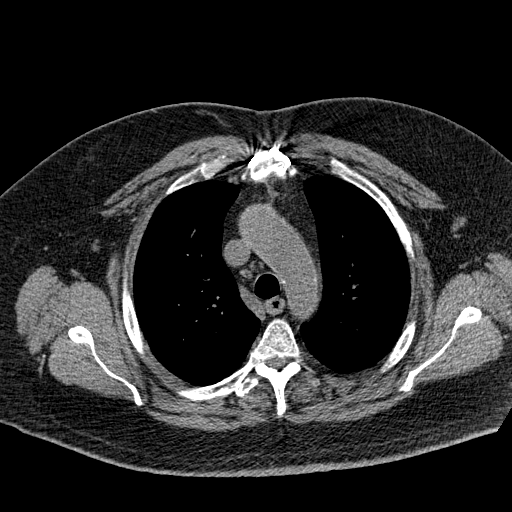
[im 128/160  lung]
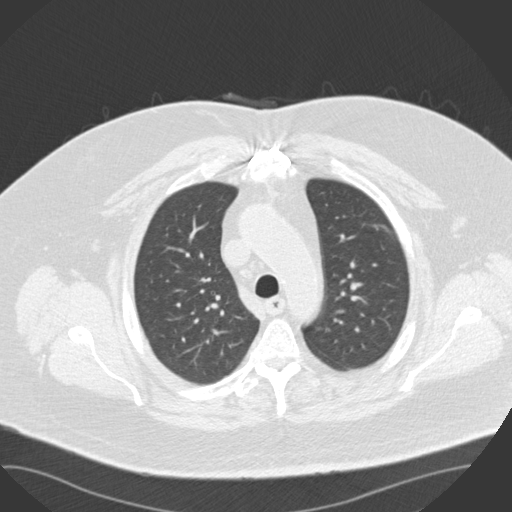
[im 136/160  lung]
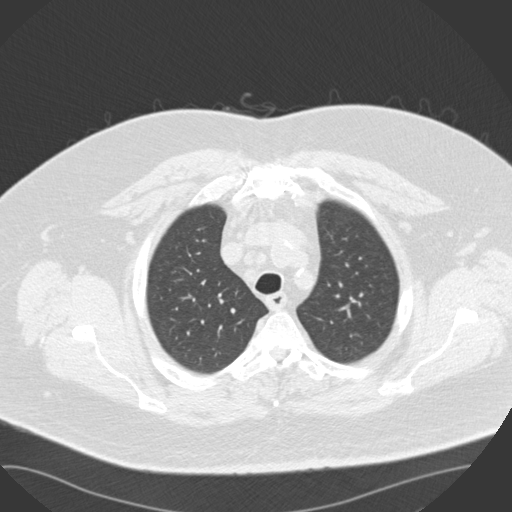
[im 148/160  lung]
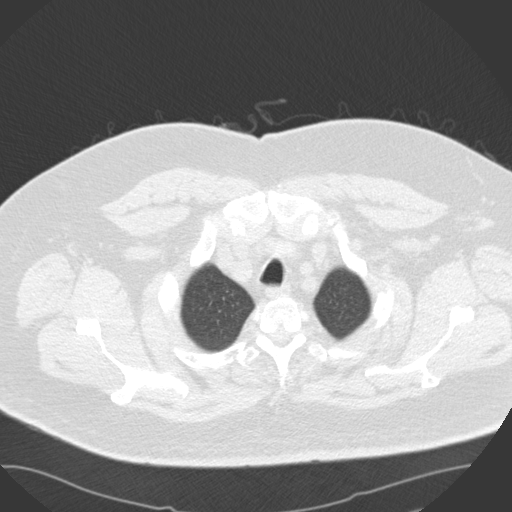

[15 of 34 positions shown; findings below may reference images not displayed]

FINDINGS: Linear scarring along the minor and major fissure on the right and
along the major fissure on the left extending into the left upper
lobe is noted. No focal infiltrate or effusion is seen. No
suspicious lung nodule is noted. The central airway is patent.
Median sternotomy sutures are noted from prior CABG. A calcified
granuloma is present within the left lower lobe, with another
calcified granuloma in the posterior medial right lower lobe. Slight
irregularity of the anterolateral right seventh rib may be due to
subacute or old fracture.

On soft tissue window images, the thyroid gland is somewhat
inhomogeneous but unremarkable. On this unenhanced study, there are
a few small mediastinal lymph nodes which are stable and not
pathologically enlarged. The mid ascending thoracic aorta measures
4.0 cm on image 48. Recommend annual imaging followup by CTA or MRA.
This recommendation follows 5848
ACCF/AHA/AATS/ACR/ASA/SCA/FERNANDO EZEQUIEL/NIELSEN/DEEQA RAYAAN/KUMBONG Guidelines for the
Diagnosis and Management of Patients with Thoracic Aortic Disease.
Circulation. 5848; 121: [id number]. The portion of the upper abdomen
that is visualized is unremarkable. The thoracic vertebrae are in
normal alignment with no acute abnormality.
IMPRESSION: 1. There is linear scarring bilaterally as noted above. No active
infiltrate or effusion is seen and no suspicious lung nodule is
noted.
2. Calcified granulomas bilaterally appear
3. 4.0 cm mid ascending thoracic aorta. Recommend annual imaging
followup by CTA or MRA. This recommendation follows 5848
ACCF/AHA/AATS/ACR/ASA/SCA/FERNANDO EZEQUIEL/NIELSEN/DEEQA RAYAAN/KUMBONG Guidelines for the
Diagnosis and Management of Patients with Thoracic Aortic Disease.
Circulation. 5848; 121: [id number] .
4. Slight cortical regularity of the anterolateral right seventh
rib. Possible subacute or old fracture.

## 2017-05-29 ENCOUNTER — Other Ambulatory Visit: Payer: Self-pay

## 2017-05-29 ENCOUNTER — Emergency Department: Payer: Medicare HMO

## 2017-05-29 ENCOUNTER — Emergency Department
Admission: EM | Admit: 2017-05-29 | Discharge: 2017-05-29 | Disposition: A | Discharge status: Home / Self Care | Payer: Medicare HMO | Attending: Student in an Organized Health Care Education/Training Program | Admitting: Student in an Organized Health Care Education/Training Program

## 2017-05-29 DIAGNOSIS — Z79899 Other long term (current) drug therapy: Secondary | ICD-10-CM | POA: Diagnosis not present

## 2017-05-29 DIAGNOSIS — I252 Old myocardial infarction: Secondary | ICD-10-CM | POA: Insufficient documentation

## 2017-05-29 DIAGNOSIS — Z951 Presence of aortocoronary bypass graft: Secondary | ICD-10-CM | POA: Diagnosis not present

## 2017-05-29 DIAGNOSIS — M5431 Sciatica, right side: Secondary | ICD-10-CM | POA: Insufficient documentation

## 2017-05-29 DIAGNOSIS — Z7982 Long term (current) use of aspirin: Secondary | ICD-10-CM | POA: Diagnosis not present

## 2017-05-29 DIAGNOSIS — Z8673 Personal history of transient ischemic attack (TIA), and cerebral infarction without residual deficits: Secondary | ICD-10-CM | POA: Insufficient documentation

## 2017-05-29 DIAGNOSIS — I2510 Atherosclerotic heart disease of native coronary artery without angina pectoris: Secondary | ICD-10-CM | POA: Insufficient documentation

## 2017-05-29 DIAGNOSIS — M25551 Pain in right hip: Secondary | ICD-10-CM | POA: Diagnosis not present

## 2017-05-29 DIAGNOSIS — I5033 Acute on chronic diastolic (congestive) heart failure: Secondary | ICD-10-CM | POA: Diagnosis not present

## 2017-05-29 DIAGNOSIS — Z794 Long term (current) use of insulin: Secondary | ICD-10-CM | POA: Diagnosis not present

## 2017-05-29 DIAGNOSIS — I110 Hypertensive heart disease with heart failure: Secondary | ICD-10-CM | POA: Diagnosis not present

## 2017-05-29 DIAGNOSIS — J449 Chronic obstructive pulmonary disease, unspecified: Secondary | ICD-10-CM | POA: Insufficient documentation

## 2017-05-29 DIAGNOSIS — E119 Type 2 diabetes mellitus without complications: Secondary | ICD-10-CM | POA: Insufficient documentation

## 2017-05-29 MED ORDER — TRAMADOL HCL 50 MG PO TABS: 50.0000 mg | ORAL_TABLET | Freq: Four times a day (QID) | ORAL | 0 refills | Status: DC | PRN

## 2017-05-29 MED ORDER — KETOROLAC TROMETHAMINE 60 MG/2ML IM SOLN
60.0000 mg | Freq: Once | INTRAMUSCULAR | Status: AC
  Administered 2017-05-29: 60 mg via INTRAMUSCULAR
  Filled 2017-05-29: qty 2

## 2017-05-29 MED ORDER — CYCLOBENZAPRINE HCL 10 MG PO TABS: 10.0000 mg | ORAL_TABLET | Freq: Three times a day (TID) | ORAL | 0 refills | Status: DC | PRN

## 2017-05-29 MED ORDER — KETOROLAC TROMETHAMINE 10 MG PO TABS: 10.0000 mg | ORAL_TABLET | Freq: Four times a day (QID) | ORAL | 0 refills | Status: DC | PRN

## 2017-05-29 NOTE — ED Triage Notes (Signed)
Pt states R hip pain x 1 week. Denies trauma or injury to hip. Pt states walking and standing hurts. Pt is alert, oriented, ambulatory.

## 2017-05-29 NOTE — ED Provider Notes (Signed)
[facility] Emergency Department Provider Note  ____________________________________________   First MD Initiated Contact with Patient 05/29/17 1302     (approximate)  I have reviewed the triage vital signs and the nursing notes.   HISTORY  Chief Complaint Hip Pain    HPI Marcus Williams is a 61 y.o. male patient complaining of posterior right hip pain radiates to the back of his upper leg. Patient denies provocative incident for his complaint. Patient denies bladder or bowel dysfunction. Patient stated pain increases with walking prolonged standing. No palliative measures for complaint.Patient rates the pain as a 10 over 10. Patient described the pain as "sharp".   Past Medical History:  Diagnosis Date  . A-fib ([place])   . Aortic stenosis, moderate 11/2015  . Arthritis   . CAD (coronary artery disease)   . Chronic systolic CHF (congestive heart failure) ([place])   . Diabetes mellitus without complication ([place])    INSULIN DEPENDENT  . GERD (gastroesophageal reflux disease)   . History of cardioversion   . Hypertension   . MI (myocardial infarction) ([place])   . OSA on CPAP   . Sepsis ([place])   . Shortness of breath dyspnea   . Stroke ([place])   . SVT (supraventricular tachycardia) [facility])     Patient Active Problem List   Diagnosis Date Noted  . TIA (transient ischemic attack) 03/01/2017  . Aortic stenosis 11/21/2015  . Acute on chronic diastolic CHF (congestive heart failure), NYHA class 3 ([place]) 11/21/2015  . Aortic valve, bicuspid 09/24/2015  . Cellulitis 05/04/2015  . Acute respiratory failure with hypoxia ([place]) 03/26/2015  . CHF (congestive heart failure) ([place]) 01/15/2015  . HTN (hypertension) 01/14/2015  . Type II diabetes mellitus ([place]) 01/14/2015  . COPD (chronic obstructive pulmonary disease) ([place]) 01/14/2015  . OSA on CPAP 01/14/2015  . CAD (coronary artery disease) 01/14/2015  . A-fib ([place]) 01/14/2015  . Pain of left calf 01/14/2015  . Acute  on chronic systolic CHF (congestive heart failure) ([place]) 01/14/2015  . COPD exacerbation ([place]) 12/22/2014  . Diabetes ([place]) 10/14/2014    Past Surgical History:  Procedure Laterality Date  . 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 BUNIONECTOMY    . CARDIAC CATHETERIZATION N/A 10/02/2015   Procedure: Coronary/Grafts Angiography;  Surgeon: Teodoro Spray, MD;  Location: [place] CV LAB;  Service: Cardiovascular;  Laterality: N/A;  . CARDIAC CATHETERIZATION N/A 11/21/2015   Procedure: Right and Left Heart Cath;  Surgeon: Sherren Mocha, MD;  Location: [place] [facility];  Service: Cardiovascular;  Laterality: N/A;  . CORONARY ARTERY BYPASS GRAFT    . KNEE SURGERY    . quadruple bypass    . TEE WITHOUT CARDIOVERSION  11/21/2015  . TEE WITHOUT CARDIOVERSION N/A 11/21/2015   Procedure: TRANSESOPHAGEAL ECHOCARDIOGRAM (TEE);  Surgeon: Larey Dresser, MD;  Location: [place];  Service: Cardiovascular;  Laterality: N/A;    Prior to Admission medications   Medication Sig Start Date End Date Taking? Authorizing Provider  albuterol (PROVENTIL HFA;VENTOLIN HFA) 108 (90 Base) MCG/ACT inhaler Inhale 2 puffs into the lungs every 6 (six) hours as needed for wheezing or shortness of breath. 07/11/15   Flora Lipps, MD  aspirin 81 MG EC tablet Take 1 tablet (81 mg total) by mouth daily. 03/10/16   Sherren Mocha, MD  cyclobenzaprine (FLEXERIL) 10 MG tablet Take 1 tablet (10 mg total) by mouth  3 (three) times daily as needed. 05/29/17   Sable Feil, PA-C  diltiazem (CARDIZEM CD) 300 MG 24 hr capsule Take 1 capsule (300 mg total) by mouth daily. 10/15/14   Idelle Crouch, MD  docusate sodium (COLACE) 100 MG capsule Take 100 mg by mouth 2 (two) times daily.    [provider]  ferrous sulfate 325 (65 FE) MG tablet Take 1 tablet (325 mg total) by mouth daily with breakfast. 05/16/16    Clegg, Amy D, NP  gabapentin (NEURONTIN) 300 MG capsule Take 300 mg by mouth 2 (two) times daily.    [provider]  insulin NPH Human (HUMULIN N,NOVOLIN N) 100 UNIT/ML injection Inject 100 Units into the skin 2 (two) times daily.     [provider]  ketorolac (TORADOL) 10 MG tablet Take 1 tablet (10 mg total) by mouth every 6 (six) hours as needed. 05/29/17   Sable Feil, PA-C  lisinopril (PRINIVIL,ZESTRIL) 20 MG tablet Take 20 mg by mouth daily.    [provider]  meclizine (ANTIVERT) 12.5 MG tablet Take 1 tablet (12.5 mg total) by mouth 3 (three) times daily as needed for dizziness. 03/01/17   Hillary Bow, MD  metFORMIN (GLUCOPHAGE) 1000 MG tablet Take 1,000 mg by mouth 2 (two) times daily with a meal.    [provider]  metoprolol (LOPRESSOR) 100 MG tablet Take 100 mg by mouth 2 (two) times daily. 10/15/15   [provider]  nitroGLYCERIN (NITROSTAT) 0.4 MG SL tablet Place 0.4 mg under the tongue every 5 (five) minutes as needed for chest pain.    [provider]  pantoprazole (PROTONIX) 40 MG tablet Take 40 mg by mouth daily.    [provider]  potassium chloride 20 MEQ TBCR Take 20 mEq by mouth daily. 11/23/15   Shirley Friar, PA-C  simvastatin (ZOCOR) 40 MG tablet Take 40 mg by mouth at bedtime.     [provider]  torsemide (DEMADEX) 20 MG tablet TAKE 3 TABLETS (60 MG TOTAL) BY MOUTH DAILY. 07/16/16   Shirley Friar, PA-C  traMADol (ULTRAM) 50 MG tablet Take 1 tablet (50 mg total) by mouth every 6 (six) hours as needed for moderate pain. 05/29/17   Sable Feil, PA-C  traZODone (DESYREL) 100 MG tablet Take 100 mg by mouth at bedtime.    [provider]  umeclidinium-vilanterol (ANORO ELLIPTA) 62.5-25 MCG/INH AEPB Inhale 1 puff into the lungs daily. 10/02/16   Flora Lipps, MD    Allergies Patient has no known allergies.  Family History  Problem Relation Age of Onset  . Stroke  Mother   . Heart attack Mother   . Hypertension Mother   . Heart attack Father   . Hypertension Father   . Heart attack Brother        #1  . Diabetes Brother        #1  . Heart disease Brother        #2  . Lung disease Brother        #2  . Hypertension Brother        #2  . Diabetes Brother        #2    Social History Social History   Tobacco Use  . Smoking status: Never Smoker  . Smokeless tobacco: Never Used  Substance Use Topics  . Alcohol use: No  . Drug use: No    Review of Systems Constitutional: No fever/chills Eyes: No visual changes.  ENT: No sore throat. Cardiovascular: Denies chest pain. Respiratory: Denies shortness of breath. Gastrointestinal: No abdominal pain.  No nausea, no vomiting.  No diarrhea.  No constipation. Genitourinary: Negative for dysuria. Musculoskeletal: Right hip  Skin: Negative for rash. Neurological: Negative for headaches, focal weakness or numbness. Endocrine:Diabetes and hyperlipidemia ____________________________________________   PHYSICAL EXAM:  VITAL SIGNS: ED Triage Vitals [05/29/17 1238]  Enc Vitals Group     BP (!) 144/62     Pulse Rate (!) 50     Resp 16     Temp 97.9 F (36.6 C)     Temp Source Oral     SpO2 97 %     Weight 280 lb (127 kg)     Height 5\' 8"  (1.727 m)     Head Circumference      Peak Flow      Pain Score 10     Pain Loc      Pain Edu?      Excl. in [place]?    Constitutional: Alert and oriented. Well appearing and in no acute distress. Cardiovascular: Asymptomatic bradycardia. Grossly normal heart sounds.  Good peripheral circulation. Respiratory: Normal respiratory effort.  No retractions. Lungs CTAB. Gastrointestinal: Soft and nontender. No distention. No abdominal bruits. No CVA tenderness. Musculoskeletal: No obvious lumbar spinal deformity. No leg length discrepancy. Patient has guarding right iliac crest. Patient walks with normal gait. Neurologic:  Normal speech and language. No gross  focal neurologic deficits are appreciated. No gait instability. Skin:  Skin is warm, dry and intact. No rash noted. Psychiatric: Mood and affect are normal. Speech and behavior are normal.  ____________________________________________   LABS (all labs ordered are listed, but only abnormal results are displayed)  Labs Reviewed - No data to display ____________________________________________  EKG   ____________________________________________  RADIOLOGY  Dg Hip Unilat W Or Wo Pelvis 2-3 Views Right  Result Date: 05/29/2017 CLINICAL DATA:  Right hip pain, no known injury, initial encounter EXAM: DG HIP (WITH OR WITHOUT PELVIS) 2-3V RIGHT COMPARISON:  None. FINDINGS: Pelvic ring is intact. No acute fracture or dislocation is noted. No soft tissue changes are seen. Vascular calcifications noted. IMPRESSION: No acute abnormality noted Electronically Signed   By: Inez Catalina M.D.   On: 05/29/2017 13:37    ____________________________________________   PROCEDURES  Procedure(s) performed: None  Procedures  Critical Care performed: No  ____________________________________________   INITIAL IMPRESSION / ASSESSMENT AND PLAN / ED COURSE  As part of my medical decision making, I reviewed the following data within the electronic MEDICAL RECORD NUMBER    Right hip pain with radicular component to the right lower extremity. Discussed negative x-ray finding with patient. Patient given discharge Instructions. Patient advised follow-up PCP in 3-5 days no improvement. Patient advised take medication as directed.      ____________________________________________   FINAL CLINICAL IMPRESSION(S) / ED DIAGNOSES  Final diagnoses:  Right hip pain  Sciatica of right side     ED Discharge Orders        Ordered    ketorolac (TORADOL) 10 MG tablet  Every 6 hours PRN     05/29/17 1349    cyclobenzaprine (FLEXERIL) 10 MG tablet  3 times daily PRN     05/29/17 1349    traMADol (ULTRAM)  50 MG tablet  Every 6 hours PRN     05/29/17 1349       Note:  This document was prepared using Dragon voice recognition software and may include unintentional dictation errors.  Sable Feil, PA-C 05/29/17 1356    Merlyn Lot, MD 05/29/17 [phone]

## 2017-05-29 NOTE — ED Notes (Signed)
Pt presents today with  Right hip pain that has been going on for 1 week. Pt states today is worse. Pt denies falling or trauma. Pt is NAD awaiting EDP.

## 2017-05-29 NOTE — ED Triage Notes (Signed)
First Nurse Note:  Arrives with c/o right lower back / right hip pain x 2 weeks.  Denies injury but states he does help care for his wife who is wheelchair bound.  Ambulates with easy / steady gait. NAD

## 2017-06-01 DIAGNOSIS — E1143 Type 2 diabetes mellitus with diabetic autonomic (poly)neuropathy: Secondary | ICD-10-CM | POA: Diagnosis not present

## 2017-06-01 DIAGNOSIS — M5441 Lumbago with sciatica, right side: Secondary | ICD-10-CM | POA: Diagnosis not present

## 2017-06-05 DIAGNOSIS — L97511 Non-pressure chronic ulcer of other part of right foot limited to breakdown of skin: Secondary | ICD-10-CM | POA: Diagnosis not present

## 2017-06-10 IMAGING — DX DG CHEST 1V PORT
1 series · 1 of 1 positions shown · non-contrast
Comparison: CT chest 09/19/2015

CLINICAL DATA: Left-sided chest pain and shortness of breath

EXAM:
PORTABLE CHEST 1 VIEW

[chest ap]
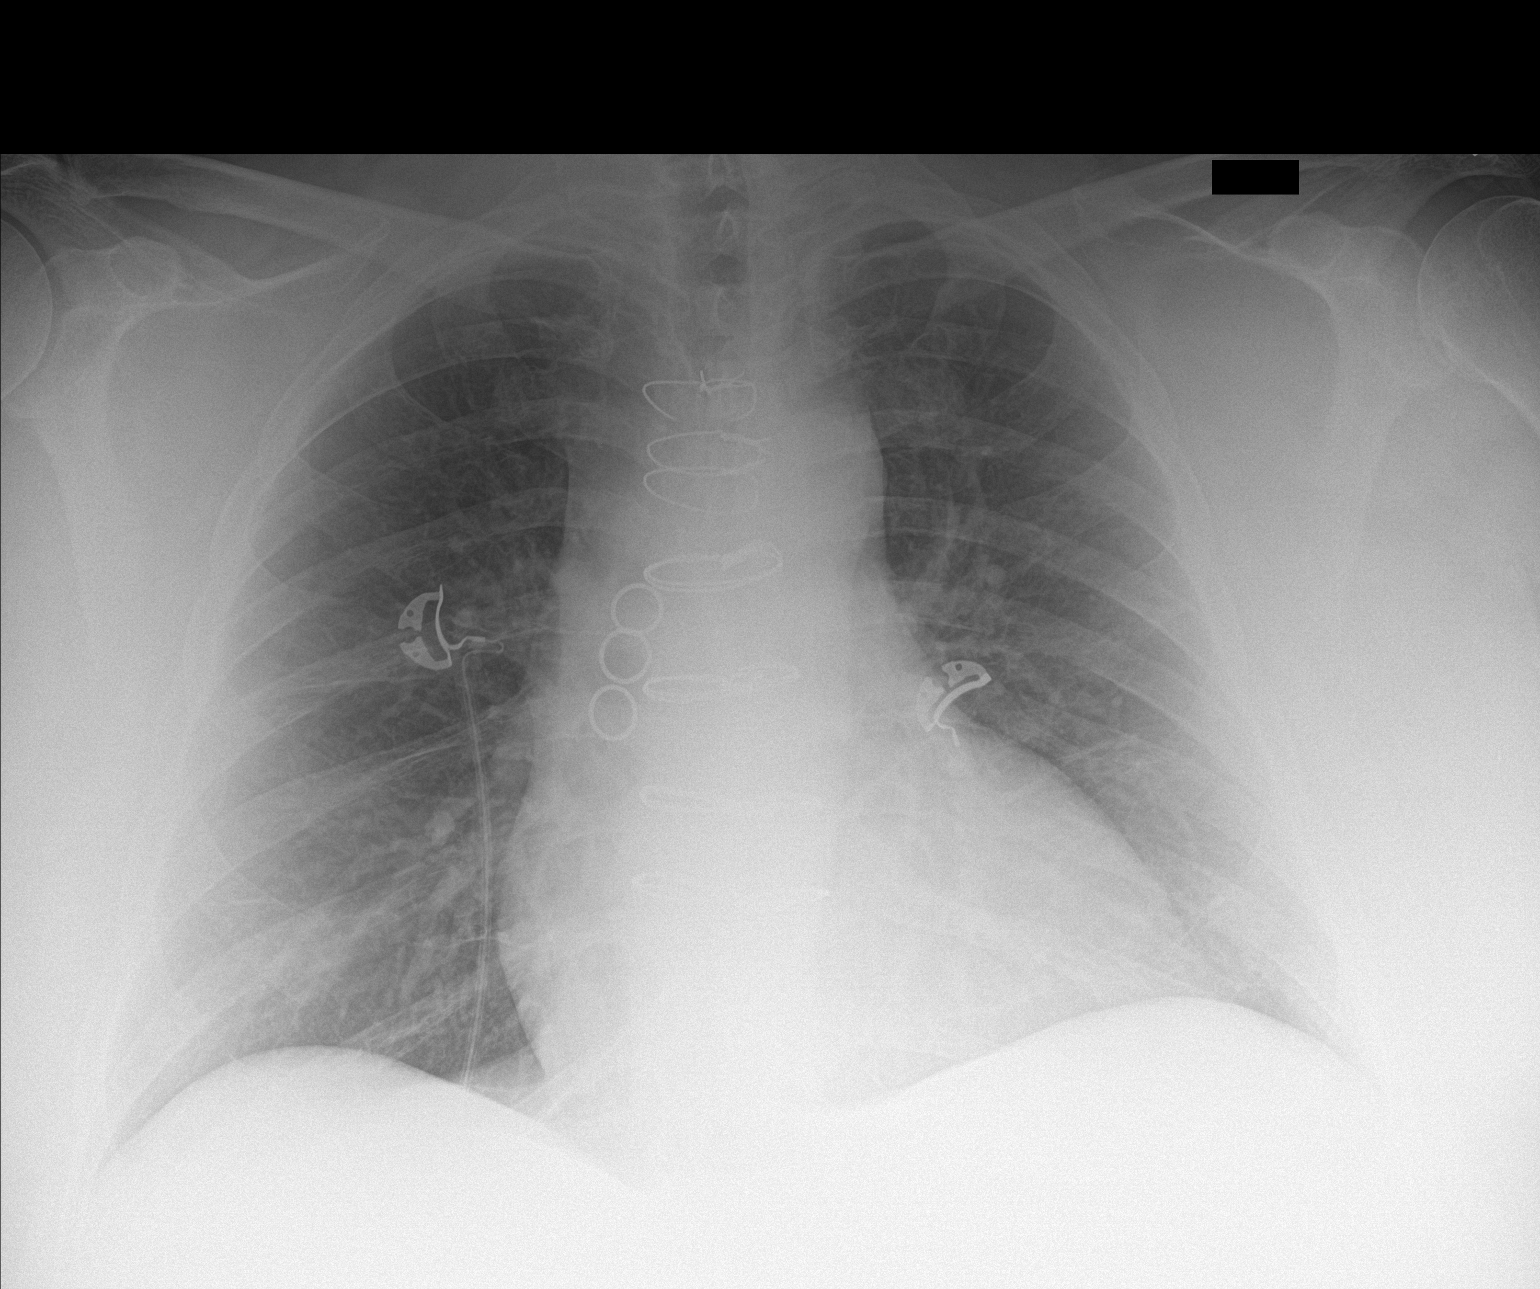

[1 of 1 positions shown; findings below may reference images not displayed]

FINDINGS: There is no focal parenchymal opacity. There is no pleural effusion
or pneumothorax. The heart and mediastinal contours are
unremarkable. There is evidence of prior CABG.

The osseous structures are unremarkable.
IMPRESSION: No active disease.

## 2017-06-12 DIAGNOSIS — M5416 Radiculopathy, lumbar region: Secondary | ICD-10-CM | POA: Diagnosis not present

## 2017-06-17 DIAGNOSIS — L97511 Non-pressure chronic ulcer of other part of right foot limited to breakdown of skin: Secondary | ICD-10-CM | POA: Diagnosis not present

## 2017-06-19 DIAGNOSIS — M5136 Other intervertebral disc degeneration, lumbar region: Secondary | ICD-10-CM | POA: Diagnosis not present

## 2017-06-19 DIAGNOSIS — M5416 Radiculopathy, lumbar region: Secondary | ICD-10-CM | POA: Diagnosis not present

## 2017-06-22 ENCOUNTER — Emergency Department
Admission: EM | Admit: 2017-06-22 | Discharge: 2017-06-23 | Disposition: A | Discharge status: Home / Self Care | Payer: Medicare HMO | Attending: Emergency Medicine | Admitting: Emergency Medicine

## 2017-06-22 ENCOUNTER — Encounter: Payer: Self-pay | Admitting: Emergency Medicine

## 2017-06-22 DIAGNOSIS — Z794 Long term (current) use of insulin: Secondary | ICD-10-CM | POA: Diagnosis not present

## 2017-06-22 DIAGNOSIS — I5041 Acute combined systolic (congestive) and diastolic (congestive) heart failure: Secondary | ICD-10-CM | POA: Diagnosis not present

## 2017-06-22 DIAGNOSIS — E119 Type 2 diabetes mellitus without complications: Secondary | ICD-10-CM | POA: Insufficient documentation

## 2017-06-22 DIAGNOSIS — J449 Chronic obstructive pulmonary disease, unspecified: Secondary | ICD-10-CM | POA: Insufficient documentation

## 2017-06-22 DIAGNOSIS — M545 Low back pain: Secondary | ICD-10-CM | POA: Diagnosis present

## 2017-06-22 DIAGNOSIS — I252 Old myocardial infarction: Secondary | ICD-10-CM | POA: Diagnosis not present

## 2017-06-22 DIAGNOSIS — Z79899 Other long term (current) drug therapy: Secondary | ICD-10-CM | POA: Diagnosis not present

## 2017-06-22 DIAGNOSIS — I110 Hypertensive heart disease with heart failure: Secondary | ICD-10-CM | POA: Insufficient documentation

## 2017-06-22 DIAGNOSIS — M5417 Radiculopathy, lumbosacral region: Secondary | ICD-10-CM | POA: Insufficient documentation

## 2017-06-22 DIAGNOSIS — Z7982 Long term (current) use of aspirin: Secondary | ICD-10-CM | POA: Diagnosis not present

## 2017-06-22 DIAGNOSIS — Z8673 Personal history of transient ischemic attack (TIA), and cerebral infarction without residual deficits: Secondary | ICD-10-CM | POA: Diagnosis not present

## 2017-06-22 DIAGNOSIS — M5416 Radiculopathy, lumbar region: Secondary | ICD-10-CM | POA: Diagnosis not present

## 2017-06-22 DIAGNOSIS — I2510 Atherosclerotic heart disease of native coronary artery without angina pectoris: Secondary | ICD-10-CM | POA: Diagnosis not present

## 2017-06-22 DIAGNOSIS — M5442 Lumbago with sciatica, left side: Secondary | ICD-10-CM | POA: Insufficient documentation

## 2017-06-22 NOTE — ED Notes (Addendum)
Pt to the er for pain on the left side of the back. Pt says pain is sharp. Pain is worse with movement. Pt given epidural last week for sciatica that affects the right side. CT scan in Oct shows no kidney stones. Pt states he takes diuretics and has no difficulty urinating. When asked if pain is the same, pt is unable to state yes or no.

## 2017-06-22 NOTE — ED Triage Notes (Signed)
Pt reports left lower back pain x2 days, unrelieved by OTC medications. Pt seen on Friday for right side pain. Pt denies urinary symptoms.

## 2017-06-23 ENCOUNTER — Other Ambulatory Visit: Payer: Self-pay

## 2017-06-23 ENCOUNTER — Emergency Department: Payer: Medicare HMO

## 2017-06-23 DIAGNOSIS — M545 Low back pain: Secondary | ICD-10-CM | POA: Diagnosis not present

## 2017-06-23 LAB — URINALYSIS, COMPLETE (UACMP) WITH MICROSCOPIC
BACTERIA UA: NONE SEEN
BILIRUBIN URINE: NEGATIVE
Glucose, UA: >=500 mg/dL — AB
Ketones, ur: NEGATIVE mg/dL
Leukocytes, UA: NEGATIVE
Nitrite: NEGATIVE
Protein, ur: NEGATIVE mg/dL
SPECIFIC GRAVITY, URINE: 1.018 (ref 1.005–1.030)
WBC, UA: NONE SEEN WBC/hpf (ref 0–5)
pH: 5 (ref 5.0–8.0)

## 2017-06-23 LAB — GLUCOSE, CAPILLARY: GLUCOSE-CAPILLARY: 266 mg/dL — AB (ref 65–99)

## 2017-06-23 MED ORDER — PREDNISONE 10 MG (21) PO TBPK: ORAL_TABLET | ORAL | 0 refills | Status: DC

## 2017-06-23 MED ORDER — LORAZEPAM 2 MG/ML IJ SOLN
2.0000 mg | Freq: Once | INTRAMUSCULAR | Status: AC
  Administered 2017-06-23: 2 mg via INTRAMUSCULAR

## 2017-06-23 MED ORDER — PREDNISONE 20 MG PO TABS
60.0000 mg | ORAL_TABLET | Freq: Once | ORAL | Status: AC
  Administered 2017-06-23: 60 mg via ORAL
  Filled 2017-06-23: qty 3

## 2017-06-23 MED ORDER — KETOROLAC TROMETHAMINE 60 MG/2ML IM SOLN
60.0000 mg | Freq: Once | INTRAMUSCULAR | Status: AC
  Administered 2017-06-23: 60 mg via INTRAMUSCULAR
  Filled 2017-06-23: qty 2

## 2017-06-23 MED ORDER — LIDOCAINE 5 % EX PTCH
1.0000 | MEDICATED_PATCH | CUTANEOUS | Status: DC
  Administered 2017-06-23: 1 via TRANSDERMAL
  Filled 2017-06-23: qty 1

## 2017-06-23 MED ORDER — LORAZEPAM 2 MG/ML IJ SOLN
INTRAMUSCULAR | Status: AC
  Filled 2017-06-23: qty 1

## 2017-06-23 MED ORDER — LIDOCAINE 5 % EX PTCH: 1.0000 | MEDICATED_PATCH | Freq: Two times a day (BID) | CUTANEOUS | 0 refills | Status: DC

## 2017-06-23 NOTE — Discharge Instructions (Signed)
Please follow up with your primary care physician as well as orthopedic surgery

## 2017-06-23 NOTE — ED Provider Notes (Signed)
[facility] Emergency Department Provider Note   ____________________________________________   First MD Initiated Contact with Patient 06/23/17 0002     (approximate)  I have reviewed the triage vital signs and the nursing notes.   HISTORY  Chief Complaint Back Pain    HPI Marcus Williams is a 62 y.o. male who comes into the hospital today with some back pain.  The patient is having pain on the left side of his back.  The patient states that he was seen here on Friday with some hip pain and right-sided back pain.  He reports that he was told he had a pinched nerve and 2 bulging disks.  He received 2 shots and he was doing fine until this pain started today.  He reports that the pain that was on the left side and became worse around 4:00.  He is unsure if it was the same thing.  He took some pain medicine at home but he reports it did not help.  He is never had pain like this on the left side.  He reports that it is worse with movement and rates the pain a 50 out of 10 in intensity.  He denies any pain with urination or problems with urination or bowel movements.  He reports that the pain does not move down his leg but he has had some left leg numbness and pain for the last 6 months.  The patient is here today for evaluation.   Past Medical History:  Diagnosis Date  . A-fib ([place])   . Aortic stenosis, moderate 11/2015  . Arthritis   . CAD (coronary artery disease)   . Chronic systolic CHF (congestive heart failure) ([place])   . Diabetes mellitus without complication ([place])    INSULIN DEPENDENT  . GERD (gastroesophageal reflux disease)   . History of cardioversion   . Hypertension   . MI (myocardial infarction) ([place])   . OSA on CPAP   . Sepsis ([place])   . Shortness of breath dyspnea   . Stroke ([place])   . SVT (supraventricular tachycardia) [facility])     Patient Active Problem List   Diagnosis Date Noted  . TIA (transient ischemic attack) 03/01/2017  . Aortic  stenosis 11/21/2015  . Acute on chronic diastolic CHF (congestive heart failure), NYHA class 3 ([place]) 11/21/2015  . Aortic valve, bicuspid 09/24/2015  . Cellulitis 05/04/2015  . Acute respiratory failure with hypoxia ([place]) 03/26/2015  . CHF (congestive heart failure) ([place]) 01/15/2015  . HTN (hypertension) 01/14/2015  . Type II diabetes mellitus ([place]) 01/14/2015  . COPD (chronic obstructive pulmonary disease) ([place]) 01/14/2015  . OSA on CPAP 01/14/2015  . CAD (coronary artery disease) 01/14/2015  . A-fib ([place]) 01/14/2015  . Pain of left calf 01/14/2015  . Acute on chronic systolic CHF (congestive heart failure) ([place]) 01/14/2015  . COPD exacerbation ([place]) 12/22/2014  . Diabetes ([place]) 10/14/2014    Past Surgical History:  Procedure Laterality Date  . 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 BUNIONECTOMY    . CARDIAC CATHETERIZATION N/A 10/02/2015   Procedure: Coronary/Grafts Angiography;  Surgeon: Teodoro Spray, MD;  Location: [place] CV LAB;  Service: Cardiovascular;  Laterality: N/A;  . CARDIAC CATHETERIZATION N/A 11/21/2015   Procedure: Right and Left Heart Cath;  Surgeon: Sherren Mocha, MD;  Location: [place] CV LAB;  Service: Cardiovascular;  Laterality: N/A;  .  CORONARY ARTERY BYPASS GRAFT    . KNEE SURGERY    . quadruple bypass    . TEE WITHOUT CARDIOVERSION  11/21/2015  . TEE WITHOUT CARDIOVERSION N/A 11/21/2015   Procedure: TRANSESOPHAGEAL ECHOCARDIOGRAM (TEE);  Surgeon: Larey Dresser, MD;  Location: [place];  Service: Cardiovascular;  Laterality: N/A;    Prior to Admission medications   Medication Sig Start Date End Date Taking? Authorizing Provider  albuterol (PROVENTIL HFA;VENTOLIN HFA) 108 (90 Base) MCG/ACT inhaler Inhale 2 puffs into the lungs every 6 (six) hours as needed for wheezing or shortness of breath. 07/11/15   Flora Lipps, MD  aspirin 81  MG EC tablet Take 1 tablet (81 mg total) by mouth daily. 03/10/16   Sherren Mocha, MD  cyclobenzaprine (FLEXERIL) 10 MG tablet Take 1 tablet (10 mg total) by mouth 3 (three) times daily as needed. 05/29/17   Sable Feil, PA-C  diltiazem (CARDIZEM CD) 300 MG 24 hr capsule Take 1 capsule (300 mg total) by mouth daily. 10/15/14   Idelle Crouch, MD  docusate sodium (COLACE) 100 MG capsule Take 100 mg by mouth 2 (two) times daily.    [provider]  ferrous sulfate 325 (65 FE) MG tablet Take 1 tablet (325 mg total) by mouth daily with breakfast. 05/16/16   Clegg, Amy D, NP  gabapentin (NEURONTIN) 300 MG capsule Take 300 mg by mouth 2 (two) times daily.    [provider]  insulin NPH Human (HUMULIN N,NOVOLIN N) 100 UNIT/ML injection Inject 100 Units into the skin 2 (two) times daily.     [provider]  ketorolac (TORADOL) 10 MG tablet Take 1 tablet (10 mg total) by mouth every 6 (six) hours as needed. 05/29/17   Sable Feil, PA-C  lidocaine (LIDODERM) 5 % Place 1 patch onto the skin every 12 (twelve) hours. Remove & Discard patch within 12 hours or as directed by MD 06/23/17 06/23/18  Loney Hering, MD  lisinopril (PRINIVIL,ZESTRIL) 20 MG tablet Take 20 mg by mouth daily.    [provider]  meclizine (ANTIVERT) 12.5 MG tablet Take 1 tablet (12.5 mg total) by mouth 3 (three) times daily as needed for dizziness. 03/01/17   Hillary Bow, MD  metFORMIN (GLUCOPHAGE) 1000 MG tablet Take 1,000 mg by mouth 2 (two) times daily with a meal.    [provider]  metoprolol (LOPRESSOR) 100 MG tablet Take 100 mg by mouth 2 (two) times daily. 10/15/15   [provider]  nitroGLYCERIN (NITROSTAT) 0.4 MG SL tablet Place 0.4 mg under the tongue every 5 (five) minutes as needed for chest pain.    [provider]  pantoprazole (PROTONIX) 40 MG tablet Take 40 mg by mouth daily.    [provider]  potassium chloride 20 MEQ TBCR Take 20 mEq by  mouth daily. 11/23/15   Shirley Friar, PA-C  predniSONE (STERAPRED UNI-PAK 21 TAB) 10 MG (21) TBPK tablet Take 6 tabs on day 1 Take 5 tabs on day 2 Take 4 tabs on day 3 Take 3 tabs on day 4 Take 2 tabs on day 5 Take 1 tab on day 6 06/23/17   Loney Hering, MD  simvastatin (ZOCOR) 40 MG tablet Take 40 mg by mouth at bedtime.     [provider]  torsemide (DEMADEX) 20 MG tablet TAKE 3 TABLETS (60 MG TOTAL) BY MOUTH DAILY. 07/16/16   Shirley Friar, PA-C  traMADol (ULTRAM) 50 MG tablet Take 1 tablet (50  mg total) by mouth every 6 (six) hours as needed for moderate pain. 05/29/17   Sable Feil, PA-C  traZODone (DESYREL) 100 MG tablet Take 100 mg by mouth at bedtime.    [provider]  umeclidinium-vilanterol (ANORO ELLIPTA) 62.5-25 MCG/INH AEPB Inhale 1 puff into the lungs daily. 10/02/16   Flora Lipps, MD    Allergies Patient has no known allergies.  Family History  Problem Relation Age of Onset  . Stroke Mother   . Heart attack Mother   . Hypertension Mother   . Heart attack Father   . Hypertension Father   . Heart attack Brother        #1  . Diabetes Brother        #1  . Heart disease Brother        #2  . Lung disease Brother        #2  . Hypertension Brother        #2  . Diabetes Brother        #2    Social History Social History   Tobacco Use  . Smoking status: Never Smoker  . Smokeless tobacco: Never Used  Substance Use Topics  . Alcohol use: No  . Drug use: No    Review of Systems  Constitutional: No fever/chills Eyes: No visual changes. ENT: No sore throat. Cardiovascular: Denies chest pain. Respiratory: Denies shortness of breath. Gastrointestinal: No abdominal pain.  No nausea, no vomiting.  No diarrhea.  No constipation. Genitourinary: Negative for dysuria. Musculoskeletal: back pain, leg pain Skin: Negative for rash. Neurological: Negative for headaches, focal weakness or  numbness.   ____________________________________________   PHYSICAL EXAM:  VITAL SIGNS: ED Triage Vitals  Enc Vitals Group     BP 06/22/17 2236 (!) 170/71     Pulse Rate 06/22/17 2236 68     Resp --      Temp 06/22/17 2236 98 F (36.7 C)     Temp Source 06/22/17 2236 Oral     SpO2 06/22/17 2236 99 %     Weight 06/22/17 2235 280 lb (127 kg)     Height --      Head Circumference --      Peak Flow --      Pain Score --      Pain Loc --      Pain Edu? --      Excl. in [place]? --     Constitutional: Alert and oriented. Well appearing and in moderate distress. Eyes: Conjunctivae are normal. PERRL. EOMI. Head: Atraumatic. Nose: No congestion/rhinnorhea. Mouth/Throat: Mucous membranes are moist.  Oropharynx non-erythematous. Cardiovascular: Normal rate, regular rhythm. Systolic murmur.  Good peripheral circulation. Respiratory: Normal respiratory effort.  No retractions. Lungs CTAB. Gastrointestinal: Soft and nontender. No distention.  Positive bowel sounds Musculoskeletal: Left back and mid back tenderness to palpation with positive straight leg raise on the left. Neurologic:  Normal speech and language.  Skin:  Skin is warm, dry and intact.  Psychiatric: Mood and affect are normal.   ____________________________________________   LABS (all labs ordered are listed, but only abnormal results are displayed)  Labs Reviewed  URINALYSIS, COMPLETE (UACMP) WITH MICROSCOPIC - Abnormal; Notable for the following components:      Result Value   Color, Urine STRAW (*)    APPearance CLEAR (*)    Glucose, UA >=500 (*)    Hgb urine dipstick SMALL (*)    Squamous Epithelial / LPF 0-5 (*)    All other  components within normal limits  CBG MONITORING, ED   ____________________________________________  EKG  none ____________________________________________  RADIOLOGY  No results found.  ____________________________________________   PROCEDURES  Procedure(s) performed:  None  Procedures  Critical Care performed: No  ____________________________________________   INITIAL IMPRESSION / ASSESSMENT AND PLAN / ED COURSE  As part of my medical decision making, I reviewed the following data within the electronic MEDICAL RECORD NUMBER Notes from prior ED visits and [place] Controlled Substance Database   This is a 62 year old male who comes into the hospital today with some left back pain.  He reports that he had pain on the right before but not on the left.  I did give the patient a shot of Toradol and some prednisone.  He also received a Lidoderm patch.  I will send the patient for an MRI as he is also complaining of some left leg numbness that is been going on for multiple months.  He will be reassessed once I receive the results of his urinalysis and his MRI.  My differential diagnosis includes bulging disc, pinched nerve, radiculopathy  Clinical Course as of Jun 23 313  Tue Jun 23, 2017  0303 IMPRESSION: Mild edema associated with the left L3-4 and L4-5 facet joints, likely degenerative.  This may represent the source of pain.   Stable lumbar spondylosis greatest at the L3-4 and L4-5 levels.   L3-4 mild left foraminal and mild canal stenosis.  MR LUMBAR SPINE WO CONTRAST [AW]    Clinical Course User Index [AW] Loney Hering, MD   MRI shows some edema associated with the left L3-4 and L4-5 facet joints.  He has some stable spondylosis at the same levels.  I will give him a prescription for some steroids as well as a Lidoderm patch and Flexeril.  He will be discharged home to follow-up with orthopedic surgery.  ____________________________________________   FINAL CLINICAL IMPRESSION(S) / ED DIAGNOSES  Final diagnoses:  Acute midline low back pain with left-sided sciatica  Lumbosacral radiculopathy     ED Discharge Orders        Ordered    predniSONE (STERAPRED UNI-PAK 21 TAB) 10 MG (21) TBPK tablet     06/23/17 0315    lidocaine (LIDODERM) 5 %   Every 12 hours     06/23/17 0315       Note:  This document was prepared using Dragon voice recognition software and may include unintentional dictation errors.    Loney Hering, MD 06/23/17 [id number]

## 2017-06-23 NOTE — ED Notes (Signed)
Pt sleeping. No signs of pain noted.

## 2017-06-23 NOTE — ED Notes (Signed)
Unable to obtain electronic signature d/t signature pad not working in room; paper copy signed by pt.

## 2017-06-23 NOTE — ED Notes (Signed)
Asked pt to provide urine specimen.

## 2017-06-29 DIAGNOSIS — G4733 Obstructive sleep apnea (adult) (pediatric): Secondary | ICD-10-CM | POA: Diagnosis not present

## 2017-06-29 DIAGNOSIS — M5416 Radiculopathy, lumbar region: Secondary | ICD-10-CM | POA: Diagnosis not present

## 2017-06-29 DIAGNOSIS — E7800 Pure hypercholesterolemia, unspecified: Secondary | ICD-10-CM | POA: Diagnosis not present

## 2017-06-29 DIAGNOSIS — E1143 Type 2 diabetes mellitus with diabetic autonomic (poly)neuropathy: Secondary | ICD-10-CM | POA: Diagnosis not present

## 2017-06-29 DIAGNOSIS — Z79899 Other long term (current) drug therapy: Secondary | ICD-10-CM | POA: Diagnosis not present

## 2017-06-29 DIAGNOSIS — J4540 Moderate persistent asthma, uncomplicated: Secondary | ICD-10-CM | POA: Diagnosis not present

## 2017-06-29 DIAGNOSIS — I25810 Atherosclerosis of coronary artery bypass graft(s) without angina pectoris: Secondary | ICD-10-CM | POA: Diagnosis not present

## 2017-06-29 DIAGNOSIS — I10 Essential (primary) hypertension: Secondary | ICD-10-CM | POA: Diagnosis not present

## 2017-07-06 DIAGNOSIS — I10 Essential (primary) hypertension: Secondary | ICD-10-CM | POA: Diagnosis not present

## 2017-07-06 DIAGNOSIS — Z794 Long term (current) use of insulin: Secondary | ICD-10-CM | POA: Diagnosis not present

## 2017-07-06 DIAGNOSIS — E7800 Pure hypercholesterolemia, unspecified: Secondary | ICD-10-CM | POA: Diagnosis not present

## 2017-07-06 DIAGNOSIS — Z79899 Other long term (current) drug therapy: Secondary | ICD-10-CM | POA: Diagnosis not present

## 2017-07-06 DIAGNOSIS — L97511 Non-pressure chronic ulcer of other part of right foot limited to breakdown of skin: Secondary | ICD-10-CM | POA: Diagnosis not present

## 2017-07-06 DIAGNOSIS — E1143 Type 2 diabetes mellitus with diabetic autonomic (poly)neuropathy: Secondary | ICD-10-CM | POA: Diagnosis not present

## 2017-07-06 DIAGNOSIS — E1140 Type 2 diabetes mellitus with diabetic neuropathy, unspecified: Secondary | ICD-10-CM | POA: Diagnosis not present

## 2017-07-10 DIAGNOSIS — M5136 Other intervertebral disc degeneration, lumbar region: Secondary | ICD-10-CM | POA: Diagnosis not present

## 2017-07-10 DIAGNOSIS — M5416 Radiculopathy, lumbar region: Secondary | ICD-10-CM | POA: Diagnosis not present

## 2017-07-16 DIAGNOSIS — I480 Paroxysmal atrial fibrillation: Secondary | ICD-10-CM | POA: Diagnosis not present

## 2017-07-16 DIAGNOSIS — G4733 Obstructive sleep apnea (adult) (pediatric): Secondary | ICD-10-CM | POA: Diagnosis not present

## 2017-07-16 DIAGNOSIS — M5416 Radiculopathy, lumbar region: Secondary | ICD-10-CM | POA: Diagnosis not present

## 2017-07-16 DIAGNOSIS — R51 Headache: Secondary | ICD-10-CM | POA: Diagnosis not present

## 2017-07-16 DIAGNOSIS — I63541 Cerebral infarction due to unspecified occlusion or stenosis of right cerebellar artery: Secondary | ICD-10-CM | POA: Diagnosis not present

## 2017-07-16 DIAGNOSIS — Z9989 Dependence on other enabling machines and devices: Secondary | ICD-10-CM | POA: Diagnosis not present

## 2017-07-21 DIAGNOSIS — I480 Paroxysmal atrial fibrillation: Secondary | ICD-10-CM | POA: Diagnosis not present

## 2017-07-21 DIAGNOSIS — I350 Nonrheumatic aortic (valve) stenosis: Secondary | ICD-10-CM | POA: Diagnosis not present

## 2017-07-21 DIAGNOSIS — I5033 Acute on chronic diastolic (congestive) heart failure: Secondary | ICD-10-CM | POA: Diagnosis not present

## 2017-07-21 DIAGNOSIS — I25810 Atherosclerosis of coronary artery bypass graft(s) without angina pectoris: Secondary | ICD-10-CM | POA: Diagnosis not present

## 2017-07-21 DIAGNOSIS — I10 Essential (primary) hypertension: Secondary | ICD-10-CM | POA: Diagnosis not present

## 2017-07-21 DIAGNOSIS — E7800 Pure hypercholesterolemia, unspecified: Secondary | ICD-10-CM | POA: Diagnosis not present

## 2017-07-21 DIAGNOSIS — R251 Tremor, unspecified: Secondary | ICD-10-CM | POA: Diagnosis not present

## 2017-07-28 ENCOUNTER — Inpatient Hospital Stay
Admission: EM | Admit: 2017-07-28 | Discharge: 2017-07-31 | DRG: 247 | Disposition: A | Discharge status: Home / Self Care | Payer: Medicare HMO | Attending: Internal Medicine | Admitting: Internal Medicine

## 2017-07-28 ENCOUNTER — Encounter: Payer: Self-pay | Admitting: Emergency Medicine

## 2017-07-28 ENCOUNTER — Emergency Department: Payer: Medicare HMO

## 2017-07-28 ENCOUNTER — Other Ambulatory Visit: Payer: Self-pay

## 2017-07-28 DIAGNOSIS — I350 Nonrheumatic aortic (valve) stenosis: Secondary | ICD-10-CM | POA: Diagnosis not present

## 2017-07-28 DIAGNOSIS — I214 Non-ST elevation (NSTEMI) myocardial infarction: Secondary | ICD-10-CM | POA: Diagnosis not present

## 2017-07-28 DIAGNOSIS — K219 Gastro-esophageal reflux disease without esophagitis: Secondary | ICD-10-CM | POA: Diagnosis not present

## 2017-07-28 DIAGNOSIS — Z951 Presence of aortocoronary bypass graft: Secondary | ICD-10-CM | POA: Diagnosis not present

## 2017-07-28 DIAGNOSIS — I2510 Atherosclerotic heart disease of native coronary artery without angina pectoris: Secondary | ICD-10-CM | POA: Diagnosis present

## 2017-07-28 DIAGNOSIS — R0600 Dyspnea, unspecified: Secondary | ICD-10-CM | POA: Diagnosis not present

## 2017-07-28 DIAGNOSIS — Z955 Presence of coronary angioplasty implant and graft: Secondary | ICD-10-CM | POA: Diagnosis not present

## 2017-07-28 DIAGNOSIS — E1142 Type 2 diabetes mellitus with diabetic polyneuropathy: Secondary | ICD-10-CM | POA: Diagnosis not present

## 2017-07-28 DIAGNOSIS — Z7982 Long term (current) use of aspirin: Secondary | ICD-10-CM

## 2017-07-28 DIAGNOSIS — Z8249 Family history of ischemic heart disease and other diseases of the circulatory system: Secondary | ICD-10-CM | POA: Diagnosis not present

## 2017-07-28 DIAGNOSIS — I110 Hypertensive heart disease with heart failure: Secondary | ICD-10-CM | POA: Diagnosis present

## 2017-07-28 DIAGNOSIS — Z8673 Personal history of transient ischemic attack (TIA), and cerebral infarction without residual deficits: Secondary | ICD-10-CM

## 2017-07-28 DIAGNOSIS — R51 Headache: Secondary | ICD-10-CM

## 2017-07-28 DIAGNOSIS — I10 Essential (primary) hypertension: Secondary | ICD-10-CM | POA: Diagnosis not present

## 2017-07-28 DIAGNOSIS — G4733 Obstructive sleep apnea (adult) (pediatric): Secondary | ICD-10-CM | POA: Diagnosis present

## 2017-07-28 DIAGNOSIS — I272 Pulmonary hypertension, unspecified: Secondary | ICD-10-CM | POA: Diagnosis present

## 2017-07-28 DIAGNOSIS — R0789 Other chest pain: Secondary | ICD-10-CM | POA: Diagnosis not present

## 2017-07-28 DIAGNOSIS — Z79899 Other long term (current) drug therapy: Secondary | ICD-10-CM

## 2017-07-28 DIAGNOSIS — I480 Paroxysmal atrial fibrillation: Secondary | ICD-10-CM | POA: Diagnosis present

## 2017-07-28 DIAGNOSIS — Z89421 Acquired absence of other right toe(s): Secondary | ICD-10-CM | POA: Diagnosis not present

## 2017-07-28 DIAGNOSIS — T380X5A Adverse effect of glucocorticoids and synthetic analogues, initial encounter: Secondary | ICD-10-CM | POA: Diagnosis not present

## 2017-07-28 DIAGNOSIS — I252 Old myocardial infarction: Secondary | ICD-10-CM

## 2017-07-28 DIAGNOSIS — Z6841 Body Mass Index (BMI) 40.0 and over, adult: Secondary | ICD-10-CM | POA: Diagnosis not present

## 2017-07-28 DIAGNOSIS — E1140 Type 2 diabetes mellitus with diabetic neuropathy, unspecified: Secondary | ICD-10-CM | POA: Diagnosis not present

## 2017-07-28 DIAGNOSIS — J441 Chronic obstructive pulmonary disease with (acute) exacerbation: Secondary | ICD-10-CM | POA: Diagnosis not present

## 2017-07-28 DIAGNOSIS — Z833 Family history of diabetes mellitus: Secondary | ICD-10-CM | POA: Diagnosis not present

## 2017-07-28 DIAGNOSIS — R519 Headache, unspecified: Secondary | ICD-10-CM

## 2017-07-28 DIAGNOSIS — Z7901 Long term (current) use of anticoagulants: Secondary | ICD-10-CM

## 2017-07-28 DIAGNOSIS — Z794 Long term (current) use of insulin: Secondary | ICD-10-CM

## 2017-07-28 DIAGNOSIS — Z9989 Dependence on other enabling machines and devices: Secondary | ICD-10-CM | POA: Diagnosis not present

## 2017-07-28 DIAGNOSIS — I482 Chronic atrial fibrillation: Secondary | ICD-10-CM | POA: Diagnosis not present

## 2017-07-28 DIAGNOSIS — I471 Supraventricular tachycardia: Secondary | ICD-10-CM | POA: Diagnosis not present

## 2017-07-28 DIAGNOSIS — I5022 Chronic systolic (congestive) heart failure: Secondary | ICD-10-CM | POA: Diagnosis present

## 2017-07-28 DIAGNOSIS — E119 Type 2 diabetes mellitus without complications: Secondary | ICD-10-CM | POA: Diagnosis not present

## 2017-07-28 DIAGNOSIS — J9601 Acute respiratory failure with hypoxia: Secondary | ICD-10-CM | POA: Diagnosis not present

## 2017-07-28 DIAGNOSIS — R0989 Other specified symptoms and signs involving the circulatory and respiratory systems: Secondary | ICD-10-CM | POA: Diagnosis not present

## 2017-07-28 DIAGNOSIS — R0602 Shortness of breath: Secondary | ICD-10-CM | POA: Diagnosis not present

## 2017-07-28 DIAGNOSIS — E1165 Type 2 diabetes mellitus with hyperglycemia: Secondary | ICD-10-CM | POA: Diagnosis not present

## 2017-07-28 DIAGNOSIS — R079 Chest pain, unspecified: Secondary | ICD-10-CM | POA: Diagnosis not present

## 2017-07-28 DIAGNOSIS — G2581 Restless legs syndrome: Secondary | ICD-10-CM | POA: Diagnosis not present

## 2017-07-28 LAB — CBC
HEMATOCRIT: 40.9 % (ref 40.0–52.0)
HEMOGLOBIN: 13.9 g/dL (ref 13.0–18.0)
MCH: 30 pg (ref 26.0–34.0)
MCHC: 34 g/dL (ref 32.0–36.0)
MCV: 88.4 fL (ref 80.0–100.0)
Platelets: 151 10*3/uL (ref 150–440)
RBC: 4.63 MIL/uL (ref 4.40–5.90)
RDW: 14 % (ref 11.5–14.5)
WBC: 6.9 10*3/uL (ref 3.8–10.6)

## 2017-07-28 LAB — BASIC METABOLIC PANEL
Anion gap: 11 (ref 5–15)
BUN: 16 mg/dL (ref 6–20)
CO2: 21 mmol/L — ABNORMAL LOW (ref 22–32)
Calcium: 8.5 mg/dL — ABNORMAL LOW (ref 8.9–10.3)
Chloride: 107 mmol/L (ref 101–111)
Creatinine, Ser: 1.25 mg/dL — ABNORMAL HIGH (ref 0.61–1.24)
GFR calc Af Amer: >60 mL/min (ref >60)
GFR calc non Af Amer: >60 mL/min (ref >60)
Glucose, Bld: 301 mg/dL — ABNORMAL HIGH (ref 65–99)
Potassium: 3.6 mmol/L (ref 3.5–5.1)
Sodium: 139 mmol/L (ref 135–145)

## 2017-07-28 LAB — GLUCOSE, CAPILLARY: Glucose-Capillary: 204 mg/dL — ABNORMAL HIGH (ref 65–99)

## 2017-07-28 LAB — PROTIME-INR
INR: 0.89
Prothrombin Time: 12 seconds (ref 11.4–15.2)

## 2017-07-28 LAB — TROPONIN I: Troponin I: 0.23 ng/mL (ref <0.03)

## 2017-07-28 LAB — HEPARIN LEVEL (UNFRACTIONATED): Heparin Unfractionated: 0.15 IU/mL — ABNORMAL LOW (ref 0.30–0.70)

## 2017-07-28 LAB — APTT: aPTT: 32 seconds (ref 24–36)

## 2017-07-28 MED ORDER — ACETAMINOPHEN 650 MG RE SUPP: 650.0000 mg | Freq: Four times a day (QID) | RECTAL | Status: DC | PRN

## 2017-07-28 MED ORDER — SODIUM CHLORIDE 0.9 % IV SOLN: 250.0000 mL | INTRAVENOUS | Status: DC | PRN

## 2017-07-28 MED ORDER — MORPHINE SULFATE (PF) 2 MG/ML IV SOLN
2.0000 mg | INTRAVENOUS | Status: DC | PRN
  Administered 2017-07-28 – 2017-07-31 (×4): 2 mg via INTRAVENOUS
  Filled 2017-07-28 (×3): qty 1

## 2017-07-28 MED ORDER — LISINOPRIL 20 MG PO TABS
20.0000 mg | ORAL_TABLET | Freq: Every day | ORAL | Status: DC
Start: 2017-07-29 — End: 2017-07-31
  Administered 2017-07-29 – 2017-07-31 (×2): 20 mg via ORAL
  Filled 2017-07-28 (×3): qty 1

## 2017-07-28 MED ORDER — METOPROLOL TARTRATE 50 MG PO TABS
100.0000 mg | ORAL_TABLET | Freq: Two times a day (BID) | ORAL | Status: DC
  Administered 2017-07-28 – 2017-07-31 (×5): 100 mg via ORAL
  Filled 2017-07-28 (×6): qty 2

## 2017-07-28 MED ORDER — SODIUM CHLORIDE 0.9 % IV BOLUS (SEPSIS)
1000.0000 mL | Freq: Once | INTRAVENOUS | Status: AC
  Administered 2017-07-28: 1000 mL via INTRAVENOUS

## 2017-07-28 MED ORDER — POTASSIUM CHLORIDE CRYS ER 20 MEQ PO TBCR
20.0000 meq | EXTENDED_RELEASE_TABLET | Freq: Every day | ORAL | Status: DC
  Administered 2017-07-29 – 2017-07-31 (×3): 20 meq via ORAL
  Filled 2017-07-28 (×3): qty 1

## 2017-07-28 MED ORDER — ASPIRIN 81 MG PO CHEW
324.0000 mg | CHEWABLE_TABLET | Freq: Once | ORAL | Status: AC
  Administered 2017-07-28: 324 mg via ORAL
  Filled 2017-07-28: qty 4

## 2017-07-28 MED ORDER — HEPARIN BOLUS VIA INFUSION
4000.0000 [IU] | Freq: Once | INTRAVENOUS | Status: AC
  Administered 2017-07-28: 4000 [IU] via INTRAVENOUS
  Filled 2017-07-28: qty 4000

## 2017-07-28 MED ORDER — INSULIN ASPART 100 UNIT/ML ~~LOC~~ SOLN
0.0000 [IU] | Freq: Three times a day (TID) | SUBCUTANEOUS | Status: DC
  Administered 2017-07-29 (×4): 2 [IU] via SUBCUTANEOUS
  Administered 2017-07-30 (×2): 1 [IU] via SUBCUTANEOUS
  Administered 2017-07-30 – 2017-07-31 (×2): 2 [IU] via SUBCUTANEOUS
  Filled 2017-07-28 (×8): qty 1

## 2017-07-28 MED ORDER — PRAMIPEXOLE DIHYDROCHLORIDE 0.25 MG PO TABS
0.5000 mg | ORAL_TABLET | Freq: Three times a day (TID) | ORAL | Status: DC
  Administered 2017-07-28 – 2017-07-31 (×7): 0.5 mg via ORAL
  Filled 2017-07-28 (×7): qty 2

## 2017-07-28 MED ORDER — CYCLOBENZAPRINE HCL 10 MG PO TABS: 10.0000 mg | ORAL_TABLET | Freq: Three times a day (TID) | ORAL | Status: DC | PRN

## 2017-07-28 MED ORDER — SIMVASTATIN 20 MG PO TABS
40.0000 mg | ORAL_TABLET | Freq: Every day | ORAL | Status: DC
  Administered 2017-07-28 – 2017-07-30 (×3): 40 mg via ORAL
  Filled 2017-07-28 (×3): qty 2

## 2017-07-28 MED ORDER — TRAZODONE HCL 100 MG PO TABS
100.0000 mg | ORAL_TABLET | Freq: Every day | ORAL | Status: DC
  Administered 2017-07-28 – 2017-07-30 (×3): 100 mg via ORAL
  Filled 2017-07-28 (×3): qty 1

## 2017-07-28 MED ORDER — NITROGLYCERIN 2 % TD OINT
0.5000 [in_us] | TOPICAL_OINTMENT | Freq: Four times a day (QID) | TRANSDERMAL | Status: DC
  Administered 2017-07-28 – 2017-07-31 (×9): 0.5 [in_us] via TOPICAL
  Filled 2017-07-28 (×10): qty 1

## 2017-07-28 MED ORDER — OXYCODONE HCL 5 MG PO TABS
5.0000 mg | ORAL_TABLET | ORAL | Status: DC | PRN
  Administered 2017-07-28 – 2017-07-30 (×4): 5 mg via ORAL
  Filled 2017-07-28 (×4): qty 1

## 2017-07-28 MED ORDER — PANTOPRAZOLE SODIUM 40 MG PO TBEC
40.0000 mg | DELAYED_RELEASE_TABLET | Freq: Every day | ORAL | Status: DC
  Administered 2017-07-29 – 2017-07-31 (×3): 40 mg via ORAL
  Filled 2017-07-28 (×3): qty 1

## 2017-07-28 MED ORDER — NITROGLYCERIN 0.4 MG SL SUBL: 0.4000 mg | SUBLINGUAL_TABLET | SUBLINGUAL | Status: DC | PRN

## 2017-07-28 MED ORDER — ALBUTEROL SULFATE (2.5 MG/3ML) 0.083% IN NEBU
2.5000 mg | INHALATION_SOLUTION | Freq: Four times a day (QID) | RESPIRATORY_TRACT | Status: DC | PRN
  Administered 2017-07-29 – 2017-07-31 (×3): 2.5 mg via RESPIRATORY_TRACT
  Filled 2017-07-28 (×4): qty 3

## 2017-07-28 MED ORDER — ONDANSETRON HCL 4 MG/2ML IJ SOLN: 4.0000 mg | Freq: Four times a day (QID) | INTRAMUSCULAR | Status: DC | PRN

## 2017-07-28 MED ORDER — HEPARIN (PORCINE) IN NACL 100-0.45 UNIT/ML-% IJ SOLN
1500.0000 [IU]/h | INTRAMUSCULAR | Status: DC
  Administered 2017-07-28: 1200 [IU]/h via INTRAVENOUS
  Administered 2017-07-29: 1500 [IU]/h via INTRAVENOUS
  Filled 2017-07-28 (×2): qty 250

## 2017-07-28 MED ORDER — GABAPENTIN 300 MG PO CAPS
300.0000 mg | ORAL_CAPSULE | Freq: Two times a day (BID) | ORAL | Status: DC
  Administered 2017-07-28 – 2017-07-31 (×6): 300 mg via ORAL
  Filled 2017-07-28 (×6): qty 1

## 2017-07-28 MED ORDER — FERROUS SULFATE 325 (65 FE) MG PO TABS
325.0000 mg | ORAL_TABLET | Freq: Every day | ORAL | Status: DC
  Administered 2017-07-29 – 2017-07-31 (×3): 325 mg via ORAL
  Filled 2017-07-28 (×3): qty 1

## 2017-07-28 MED ORDER — HEPARIN (PORCINE) IN NACL 100-0.45 UNIT/ML-% IJ SOLN
1200.0000 [IU]/h | Freq: Once | INTRAMUSCULAR | Status: AC
  Administered 2017-07-28: 1200 [IU]/h via INTRAVENOUS
  Filled 2017-07-28: qty 250

## 2017-07-28 MED ORDER — ACETAMINOPHEN 325 MG PO TABS
650.0000 mg | ORAL_TABLET | Freq: Four times a day (QID) | ORAL | Status: DC | PRN
  Administered 2017-07-31: 650 mg via ORAL
  Filled 2017-07-28 (×2): qty 2

## 2017-07-28 MED ORDER — INSULIN NPH (HUMAN) (ISOPHANE) 100 UNIT/ML ~~LOC~~ SUSP
50.0000 [IU] | Freq: Two times a day (BID) | SUBCUTANEOUS | Status: DC
  Administered 2017-07-28 – 2017-07-29 (×2): 50 [IU] via SUBCUTANEOUS
  Filled 2017-07-28 (×2): qty 10

## 2017-07-28 MED ORDER — HEPARIN SODIUM (PORCINE) 5000 UNIT/ML IJ SOLN: 4000.0000 [IU] | Freq: Once | INTRAMUSCULAR | Status: DC

## 2017-07-28 MED ORDER — ASPIRIN EC 81 MG PO TBEC
81.0000 mg | DELAYED_RELEASE_TABLET | Freq: Every day | ORAL | Status: DC
  Administered 2017-07-29: 81 mg via ORAL
  Filled 2017-07-28: qty 1

## 2017-07-28 MED ORDER — TORSEMIDE 20 MG PO TABS
60.0000 mg | ORAL_TABLET | Freq: Every day | ORAL | Status: DC
  Administered 2017-07-29 – 2017-07-31 (×2): 60 mg via ORAL
  Filled 2017-07-28 (×2): qty 3

## 2017-07-28 MED ORDER — ONDANSETRON HCL 4 MG PO TABS: 4.0000 mg | ORAL_TABLET | Freq: Four times a day (QID) | ORAL | Status: DC | PRN

## 2017-07-28 MED ORDER — BACLOFEN 10 MG PO TABS
10.0000 mg | ORAL_TABLET | Freq: Two times a day (BID) | ORAL | Status: DC
  Administered 2017-07-28 – 2017-07-31 (×6): 10 mg via ORAL
  Filled 2017-07-28 (×6): qty 1

## 2017-07-28 MED ORDER — SODIUM CHLORIDE 0.9% FLUSH
3.0000 mL | Freq: Two times a day (BID) | INTRAVENOUS | Status: DC
  Administered 2017-07-29: 3 mL via INTRAVENOUS

## 2017-07-28 MED ORDER — INSULIN NPH (HUMAN) (ISOPHANE) 100 UNIT/ML ~~LOC~~ SUSP
50.0000 [IU] | Freq: Two times a day (BID) | SUBCUTANEOUS | Status: DC
  Filled 2017-07-28: qty 10

## 2017-07-28 MED ORDER — NITROGLYCERIN 2 % TD OINT
TOPICAL_OINTMENT | TRANSDERMAL | Status: AC
  Administered 2017-07-28: 1.5 [in_us] via TOPICAL
  Filled 2017-07-28: qty 1

## 2017-07-28 MED ORDER — SODIUM CHLORIDE 0.9% FLUSH: 3.0000 mL | INTRAVENOUS | Status: DC | PRN

## 2017-07-28 MED ORDER — UMECLIDINIUM-VILANTEROL 62.5-25 MCG/INH IN AEPB
1.0000 | INHALATION_SPRAY | Freq: Every day | RESPIRATORY_TRACT | Status: DC
  Administered 2017-07-29 – 2017-07-31 (×3): 1 via RESPIRATORY_TRACT
  Filled 2017-07-28: qty 14

## 2017-07-28 MED ORDER — DILTIAZEM HCL ER COATED BEADS 180 MG PO CP24
300.0000 mg | ORAL_CAPSULE | Freq: Every day | ORAL | Status: DC
  Administered 2017-07-29 – 2017-07-31 (×3): 300 mg via ORAL
  Filled 2017-07-28 (×3): qty 1

## 2017-07-28 MED ORDER — MECLIZINE HCL 12.5 MG PO TABS
12.5000 mg | ORAL_TABLET | Freq: Three times a day (TID) | ORAL | Status: DC | PRN
  Filled 2017-07-28: qty 1

## 2017-07-28 MED ORDER — MORPHINE SULFATE (PF) 2 MG/ML IV SOLN
INTRAVENOUS | Status: AC
  Administered 2017-07-28: 2 mg via INTRAVENOUS
  Filled 2017-07-28: qty 1

## 2017-07-28 MED ORDER — NITROGLYCERIN 2 % TD OINT
1.5000 [in_us] | TOPICAL_OINTMENT | Freq: Once | TRANSDERMAL | Status: AC
  Administered 2017-07-28: 1.5 [in_us] via TOPICAL

## 2017-07-28 MED ORDER — TRAMADOL HCL 50 MG PO TABS: 50.0000 mg | ORAL_TABLET | Freq: Four times a day (QID) | ORAL | Status: DC | PRN

## 2017-07-28 NOTE — ED Notes (Signed)
MD at bedside. RN attempted to call pts wife per request from wife and husband but was unable to reach her. Per husband she is staying at the [facility] in room 102.

## 2017-07-28 NOTE — ED Notes (Signed)
Pt to room 25 via w/c with no distress noted; placed in hosp gown & on card monitor for further evaluation; report given to care nurse Larene Beach, RN and Dr Cinda Quest notified of troponin 0.23

## 2017-07-28 NOTE — H&P (Signed)
[facility] NAME: Marcus Williams    MR#:  [id number]  DATE OF BIRTH:  08/07/1955  DATE OF ADMISSION:  07/28/2017  PRIMARY CARE PHYSICIAN: Idelle Crouch, MD   REQUESTING/REFERRING PHYSICIAN: Conni Slipper MD  CHIEF COMPLAINT:   Chief Complaint  Patient presents with  . Chest Pain  . Headache    HISTORY OF PRESENT ILLNESS: Marcus Williams  is a 62 y.o. male with a known history of coronary artery disease status post CABG in 2007, atrial fibrillation, aortic stenosis, diabetes insulin-dependent, GERD and sleep apnea who presents with complaint of chest pain since earlier today.  He states that it feels like someone is sitting on his chest.  Also has associated shortness of breath no radiation of the pain to his neck or jaw.  Patient denies any tingling or numbness.  Does complaint of frontal headache.  PAST MEDICAL HISTORY:   Past Medical History:  Diagnosis Date  . A-fib ([place])   . Aortic stenosis, moderate 11/2015  . Arthritis   . CAD (coronary artery disease)   . Chronic systolic CHF (congestive heart failure) ([place])   . Diabetes mellitus without complication ([place])    INSULIN DEPENDENT  . GERD (gastroesophageal reflux disease)   . History of cardioversion   . Hypertension   . MI (myocardial infarction) ([place])   . OSA on CPAP   . Sepsis ([place])   . Shortness of breath dyspnea   . Stroke ([place])   . SVT (supraventricular tachycardia) ([place])     PAST SURGICAL HISTORY:  Past Surgical History:  Procedure Laterality Date  . 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 BUNIONECTOMY    . CARDIAC CATHETERIZATION N/A 10/02/2015   Procedure: Coronary/Grafts Angiography;  Surgeon: Teodoro Spray, MD;  Location: [place] [facility];  Service: Cardiovascular;  Laterality: N/A;  . CARDIAC CATHETERIZATION N/A 11/21/2015   Procedure: Right and  Left Heart Cath;  Surgeon: Sherren Mocha, MD;  Location: [place] [facility];  Service: Cardiovascular;  Laterality: N/A;  . CORONARY ARTERY BYPASS GRAFT    . KNEE SURGERY    . quadruple bypass    . TEE WITHOUT CARDIOVERSION  11/21/2015  . TEE WITHOUT CARDIOVERSION N/A 11/21/2015   Procedure: TRANSESOPHAGEAL ECHOCARDIOGRAM (TEE);  Surgeon: Larey Dresser, MD;  Location: [facility] ENDOSCOPY;  Service: Cardiovascular;  Laterality: N/A;    SOCIAL HISTORY:  Social History   Tobacco Use  . Smoking status: Never Smoker  . Smokeless tobacco: Never Used  Substance Use Topics  . Alcohol use: No    FAMILY HISTORY:  Family History  Problem Relation Age of Onset  . Stroke Mother   . Heart attack Mother   . Hypertension Mother   . Heart attack Father   . Hypertension Father   . Heart attack Brother        #1  . Diabetes Brother        #1  . Heart disease Brother        #2  . Lung disease Brother        #2  . Hypertension Brother        #2  . Diabetes Brother        #2    DRUG ALLERGIES: No Known Allergies  REVIEW OF SYSTEMS:   CONSTITUTIONAL: No fever, fatigue or weakness.  EYES: No  blurred or double vision.  EARS, NOSE, AND THROAT: No tinnitus or ear pain.  RESPIRATORY: No cough,  postive shortness of breath, wheezing or hemoptysis.  CARDIOVASCULAR: Positive chest pain, orthopnea, edema.  GASTROINTESTINAL: No nausea, vomiting, diarrhea or abdominal pain.  GENITOURINARY: No dysuria, hematuria.  ENDOCRINE: No polyuria, nocturia,  HEMATOLOGY: No anemia, easy bruising or bleeding SKIN: No rash or lesion. MUSCULOSKELETAL: No joint pain or arthritis.   NEUROLOGIC: No tingling, numbness, weakness.  PSYCHIATRY: No anxiety or depression.   MEDICATIONS AT HOME:  Prior to Admission medications   Medication Sig Start Date End Date Taking? Authorizing Provider  baclofen (LIORESAL) 10 MG tablet Take 1 tablet by mouth 2 (two) times daily. 06/20/17  Yes [provider]   cyclobenzaprine (FLEXERIL) 10 MG tablet Take 1 tablet (10 mg total) by mouth 3 (three) times daily as needed. Patient taking differently: Take 10 mg by mouth 2 (two) times daily.  05/29/17  Yes Sable Feil, PA-C  diltiazem (CARDIZEM CD) 300 MG 24 hr capsule Take 1 capsule (300 mg total) by mouth daily. 10/15/14  Yes Sparks, Leonie Douglas, MD  ELIQUIS 5 MG TABS tablet Take 1 tablet by mouth 2 (two) times daily. 07/20/17  Yes [provider]  gabapentin (NEURONTIN) 300 MG capsule Take 300 mg by mouth 2 (two) times daily.   Yes [provider]  insulin NPH Human (HUMULIN N,NOVOLIN N) 100 UNIT/ML injection Inject 100 Units into the skin 2 (two) times daily.    Yes [provider]  lisinopril (PRINIVIL,ZESTRIL) 20 MG tablet Take 20 mg by mouth daily.   Yes [provider]  metFORMIN (GLUCOPHAGE) 1000 MG tablet Take 1,000 mg by mouth 2 (two) times daily with a meal.   Yes [provider]  metoprolol (LOPRESSOR) 100 MG tablet Take 100 mg by mouth 2 (two) times daily. 10/15/15  Yes [provider]  pantoprazole (PROTONIX) 40 MG tablet Take 40 mg by mouth daily.   Yes [provider]  potassium chloride 20 MEQ TBCR Take 20 mEq by mouth daily. 11/23/15  Yes Shirley Friar, PA-C  pramipexole (MIRAPEX) 0.5 MG tablet Take 1 tablet by mouth 3 (three) times daily. 07/06/17  Yes [provider]  simvastatin (ZOCOR) 40 MG tablet Take 40 mg by mouth at bedtime.    Yes [provider]  torsemide (DEMADEX) 20 MG tablet TAKE 3 TABLETS (60 MG TOTAL) BY MOUTH DAILY. 07/16/16  Yes Shirley Friar, PA-C  traZODone (DESYREL) 100 MG tablet Take 100 mg by mouth at bedtime.   Yes [provider]  albuterol (PROVENTIL HFA;VENTOLIN HFA) 108 (90 Base) MCG/ACT inhaler Inhale 2 puffs into the lungs every 6 (six) hours as needed for wheezing or shortness of breath. 07/11/15   Flora Lipps, MD  aspirin 81 MG EC tablet Take 1 tablet (81 mg  total) by mouth daily. Patient not taking: Reported on 07/28/2017 03/10/16   Sherren Mocha, MD  ferrous sulfate 325 (65 FE) MG tablet Take 1 tablet (325 mg total) by mouth daily with breakfast. Patient not taking: Reported on 07/28/2017 05/16/16   Darrick Grinder D, NP  ketorolac (TORADOL) 10 MG tablet Take 1 tablet (10 mg total) by mouth every 6 (six) hours as needed. 05/29/17   Sable Feil, PA-C  lidocaine (LIDODERM) 5 % Place 1 patch onto the skin every 12 (twelve) hours. Remove & Discard patch within 12 hours or as directed by MD Patient not taking: Reported on 07/28/2017 06/23/17 06/23/18  Loney Hering, MD  meclizine (ANTIVERT) 12.5 MG tablet Take 1 tablet (12.5 mg total) by mouth 3 (three) times daily as needed for dizziness. 03/01/17   Hillary Bow, MD  nitroGLYCERIN (NITROSTAT) 0.4 MG SL tablet Place 0.4 mg under the tongue every 5 (five) minutes as needed for chest pain.    [provider]  predniSONE (STERAPRED UNI-PAK 21 TAB) 10 MG (21) TBPK tablet Take 6 tabs on day 1 Take 5 tabs on day 2 Take 4 tabs on day 3 Take 3 tabs on day 4 Take 2 tabs on day 5 Take 1 tab on day 6 Patient not taking: Reported on 07/28/2017 06/23/17   Loney Hering, MD  traMADol (ULTRAM) 50 MG tablet Take 1 tablet (50 mg total) by mouth every 6 (six) hours as needed for moderate pain. 05/29/17   Sable Feil, PA-C  umeclidinium-vilanterol (ANORO ELLIPTA) 62.5-25 MCG/INH AEPB Inhale 1 puff into the lungs daily. 10/02/16   Flora Lipps, MD      PHYSICAL EXAMINATION:   VITAL SIGNS: Blood pressure (!) 173/82, pulse 83, temperature 97.9 F (36.6 C), temperature source Oral, resp. rate 20, height 5\' 9"  (1.753 m), weight 280 lb (127 kg), SpO2 94 %.  GENERAL:  62 y.o.-year-old patient lying in the bed with no acute distress.  EYES: Pupils equal, round, reactive to light and accommodation. No scleral icterus. Extraocular muscles intact.  HEENT: Head atraumatic, normocephalic. Oropharynx and nasopharynx  clear.  NECK:  Supple, no jugular venous distention. No thyroid enlargement, no tenderness.  LUNGS: Normal breath sounds bilaterally, no wheezing, rales,rhonchi or crepitation. No use of accessory muscles of respiration.  CARDIOVASCULAR: S1, S2 normal. No murmurs, rubs, or gallops.  ABDOMEN: Soft, nontender, nondistended. Bowel sounds present. No organomegaly or mass.  EXTREMITIES: No pedal edema, cyanosis, or clubbing.  NEUROLOGIC: Cranial nerves II through XII are intact. Muscle strength 5/5 in all extremities. Sensation intact. Gait not checked.  PSYCHIATRIC: The patient is alert and oriented x 3.  SKIN: No obvious rash, lesion, or ulcer.   LABORATORY PANEL:   CBC Recent Labs  Lab 07/28/17 1918  WBC 6.9  HGB 13.9  HCT 40.9  PLT 151  MCV 88.4  MCH 30.0  MCHC 34.0  RDW 14.0   ------------------------------------------------------------------------------------------------------------------  Chemistries  Recent Labs  Lab 07/28/17 1918  NA 139  K 3.6  CL 107  CO2 21*  GLUCOSE 301*  BUN 16  CREATININE 1.25*  CALCIUM 8.5*   ------------------------------------------------------------------------------------------------------------------ estimated creatinine clearance is 81.8 mL/min (A) (by C-G formula based on SCr of 1.25 mg/dL (H)). ------------------------------------------------------------------------------------------------------------------ No results for input(s): TSH, T4TOTAL, T3FREE, THYROIDAB in the last 72 hours.  Invalid input(s): FREET3   Coagulation profile Recent Labs  Lab 07/28/17 2027  INR 0.89   ------------------------------------------------------------------------------------------------------------------- No results for input(s): DDIMER in the last 72 hours. -------------------------------------------------------------------------------------------------------------------  Cardiac Enzymes Recent Labs  Lab 07/28/17 1918  TROPONINI 0.23*    ------------------------------------------------------------------------------------------------------------------ Invalid input(s): POCBNP  ---------------------------------------------------------------------------------------------------------------  Urinalysis    Component Value Date/Time   COLORURINE STRAW (A) 06/23/2017 0140   APPEARANCEUR CLEAR (A) 06/23/2017 0140   APPEARANCEUR Clear 07/21/2014 1803   LABSPEC 1.018 06/23/2017 0140   LABSPEC 1.011 07/21/2014 1803   PHURINE 5.0 06/23/2017 0140   GLUCOSEU >=500 (A) 06/23/2017 0140   GLUCOSEU >=500 07/21/2014 1803   HGBUR SMALL (A) 06/23/2017 0140   BILIRUBINUR NEGATIVE 06/23/2017 0140   BILIRUBINUR Negative 07/21/2014 1803   KETONESUR NEGATIVE 06/23/2017 0140   PROTEINUR NEGATIVE 06/23/2017 0140  UROBILINOGEN 0.2 07/28/2014 1449   NITRITE NEGATIVE 06/23/2017 0140   LEUKOCYTESUR NEGATIVE 06/23/2017 0140   LEUKOCYTESUR Negative 07/21/2014 1803     RADIOLOGY: Dg Chest 2 View  Result Date: 07/28/2017 CLINICAL DATA:  62 year old male with headache and left side chest pain onset at 1700 hr today. EXAM: CHEST  2 VIEW COMPARISON:  Chest CT 03/24/2017 and earlier. FINDINGS: Stable lung volumes. Mediastinal contours remain normal. Prior CABG. Pulmonary vascular congestion appears stable to 03/24/2017 (increased from 07/19/2016) with no pleural fluid identified. Otherwise chronic increased pulmonary interstitial markings and bilateral perihilar curvilinear scarring are unchanged. No pneumothorax. No acute osseous abnormality identified. Negative visible bowel gas pattern. IMPRESSION: 1. Pulmonary vascular congestion without overt edema similar to October radiographs. 2. No pleural effusion or other acute cardiopulmonary abnormality. Electronically Signed   By: Genevie Ann M.D.   On: 07/28/2017 19:39    EKG: Orders placed or performed during the hospital encounter of 07/28/17  . EKG 12-Lead  . EKG 12-Lead  . ED EKG within 10 minutes   . ED EKG within 10 minutes    IMPRESSION AND PLAN: Patient is a 62 year old white male with history of coronary artery disease presenting with chest pressure  1.  Acute non-ST MI I have discussed the case with Dr. Norman Herrlich, who will see the patient in the morning Continue therapy with aspirin We will give him heparin Due to him being on Eliquis cardiologist states that unless this in urgent cath they will not be done tomorrow Use morphine as needed Continue Nitropatch  2.  Diabetes type 2 I will continue his home insulin hold metformin  3.  History of atrial fibrillation continue Cardizem hold Eliquis  4.  Sleep apnea use CPAP at nighttime  5.  Neuropathy continue gabapentin  6.  Tolerated hypertension continue lisinopril will use IV hydralazine as needed  All the records are reviewed and case discussed with ED provider. Management plans discussed with the patient, family and they are in agreement.  CODE STATUS: Code Status History    Date Active Date Inactive Code Status Order ID Comments User Context   03/01/2017 02:03 03/01/2017 20:04 Full Code [id number]  Harvie Bridge, DO Inpatient   11/21/2015 16:20 11/23/2015 17:10 Full Code [id number]  Sherren Mocha, MD Inpatient   05/06/2015 15:45 1September 29, 202016 16:09 Full Code [id number]  Sharlotte Alamo, MD Inpatient   05/04/2015 19:51 05/06/2015 15:45 Full Code [id number]  Vaughan Basta, MD ED   01/14/2015 14:16 01/15/2015 15:14 Full Code [id number]  Lance Coon, MD Inpatient   12/22/2014 18:25 12/23/2014 16:12 Full Code [id number]  Epifanio Lesches, MD ED   10/14/2014 12:14 10/15/2014 13:50 Full Code [id number]  Alfonso Patten, RN Inpatient       TOTAL TIME TAKING CARE OF THIS PATIENT:65minutes.    Dustin Flock M.D on 07/28/2017 at 9:03 PM  Between 7am to 6pm - Pager - [phone]  After 6pm go to [email or web address] - password [email or web address] [facility]  Office  [phone]  CC: Primary care physician; Idelle Crouch, MD

## 2017-07-28 NOTE — ED Provider Notes (Signed)
[facility] Emergency Department Provider Note   ____________________________________________   First MD Initiated Contact with Patient 07/28/17 2011     (approximate)  I have reviewed the triage vital signs and the nursing notes.   HISTORY  Chief Complaint Chest Pain and Headache    HPI Marcus Williams is a 62 y.o. male Patient says he went to eat dinner about 5:00 about an hour later he began having chest pain tightness and heaviness. He also developed a headache that started very mild and got worse. He says he's been wheezing as well. He somewhat short of breath. He told the nurse that this does not feel like when he had his CABG.   Past Medical History:  Diagnosis Date  . A-fib ([place])   . Aortic stenosis, moderate 11/2015  . Arthritis   . CAD (coronary artery disease)   . Chronic systolic CHF (congestive heart failure) ([place])   . Diabetes mellitus without complication ([place])    INSULIN DEPENDENT  . GERD (gastroesophageal reflux disease)   . History of cardioversion   . Hypertension   . MI (myocardial infarction) ([place])   . OSA on CPAP   . Sepsis ([place])   . Shortness of breath dyspnea   . Stroke ([place])   . SVT (supraventricular tachycardia) [facility])     Patient Active Problem List   Diagnosis Date Noted  . TIA (transient ischemic attack) 03/01/2017  . Aortic stenosis 11/21/2015  . Acute on chronic diastolic CHF (congestive heart failure), NYHA class 3 ([place]) 11/21/2015  . Aortic valve, bicuspid 09/24/2015  . Cellulitis 05/04/2015  . Acute respiratory failure with hypoxia ([place]) 03/26/2015  . CHF (congestive heart failure) ([place]) 01/15/2015  . HTN (hypertension) 01/14/2015  . Type II diabetes mellitus ([place]) 01/14/2015  . COPD (chronic obstructive pulmonary disease) ([place]) 01/14/2015  . OSA on CPAP 01/14/2015  . CAD (coronary artery disease) 01/14/2015  . A-fib ([place]) 01/14/2015  . Pain of left calf 01/14/2015  . Acute on chronic systolic CHF  (congestive heart failure) ([place]) 01/14/2015  . COPD exacerbation ([place]) 12/22/2014  . Diabetes ([place]) 10/14/2014    Past Surgical History:  Procedure Laterality Date  . 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 BUNIONECTOMY    . CARDIAC CATHETERIZATION N/A 10/02/2015   Procedure: Coronary/[place]s Angiography;  Surgeon: Teodoro Spray, MD;  Location: [place] CV LAB;  Service: Cardiovascular;  Laterality: N/A;  . CARDIAC CATHETERIZATION N/A 11/21/2015   Procedure: Right and Left Heart Cath;  Surgeon: Sherren Mocha, MD;  Location: [place] [facility];  Service: Cardiovascular;  Laterality: N/A;  . CORONARY ARTERY BYPASS GRAFT    . KNEE SURGERY    . quadruple bypass    . TEE WITHOUT CARDIOVERSION  11/21/2015  . TEE WITHOUT CARDIOVERSION N/A 11/21/2015   Procedure: TRANSESOPHAGEAL ECHOCARDIOGRAM (TEE);  Surgeon: Larey Dresser, MD;  Location: [place];  Service: Cardiovascular;  Laterality: N/A;    Prior to Admission medications   Medication Sig Start Date End Date Taking? Authorizing Provider  baclofen (LIORESAL) 10 MG tablet Take 1 tablet by mouth 2 (two) times daily. 06/20/17  Yes [provider]  cyclobenzaprine (FLEXERIL) 10 MG tablet Take 1 tablet (10 mg total) by mouth 3 (three) times daily as needed. Patient taking differently: Take 10 mg by mouth 2 (two) times daily.  05/29/17  Yes Sable Feil, PA-C  diltiazem (CARDIZEM CD) 300 MG 24 hr capsule Take 1 capsule (300 mg total) by mouth daily. 10/15/14  Yes Sparks, Leonie Douglas, MD  ELIQUIS 5 MG TABS tablet Take 1 tablet by mouth 2 (two) times daily. 07/20/17  Yes [provider]  gabapentin (NEURONTIN) 300 MG capsule Take 300 mg by mouth 2 (two) times daily.   Yes [provider]  insulin NPH Human (HUMULIN N,NOVOLIN N) 100 UNIT/ML injection Inject 100 Units into the skin 2 (two) times daily.    Yes  [provider]  lisinopril (PRINIVIL,ZESTRIL) 20 MG tablet Take 20 mg by mouth daily.   Yes [provider]  metFORMIN (GLUCOPHAGE) 1000 MG tablet Take 1,000 mg by mouth 2 (two) times daily with a meal.   Yes [provider]  metoprolol (LOPRESSOR) 100 MG tablet Take 100 mg by mouth 2 (two) times daily. 10/15/15  Yes [provider]  pantoprazole (PROTONIX) 40 MG tablet Take 40 mg by mouth daily.   Yes [provider]  potassium chloride 20 MEQ TBCR Take 20 mEq by mouth daily. 11/23/15  Yes Shirley Friar, PA-C  pramipexole (MIRAPEX) 0.5 MG tablet Take 1 tablet by mouth 3 (three) times daily. 07/06/17  Yes [provider]  simvastatin (ZOCOR) 40 MG tablet Take 40 mg by mouth at bedtime.    Yes [provider]  torsemide (DEMADEX) 20 MG tablet TAKE 3 TABLETS (60 MG TOTAL) BY MOUTH DAILY. 07/16/16  Yes Shirley Friar, PA-C  traZODone (DESYREL) 100 MG tablet Take 100 mg by mouth at bedtime.   Yes [provider]  albuterol (PROVENTIL HFA;VENTOLIN HFA) 108 (90 Base) MCG/ACT inhaler Inhale 2 puffs into the lungs every 6 (six) hours as needed for wheezing or shortness of breath. 07/11/15   Flora Lipps, MD  aspirin 81 MG EC tablet Take 1 tablet (81 mg total) by mouth daily. Patient not taking: Reported on 07/28/2017 03/10/16   Sherren Mocha, MD  ferrous sulfate 325 (65 FE) MG tablet Take 1 tablet (325 mg total) by mouth daily with breakfast. Patient not taking: Reported on 07/28/2017 05/16/16   Darrick Grinder D, NP  ketorolac (TORADOL) 10 MG tablet Take 1 tablet (10 mg total) by mouth every 6 (six) hours as needed. 05/29/17   Sable Feil, PA-C  lidocaine (LIDODERM) 5 % Place 1 patch onto the skin every 12 (twelve) hours. Remove & Discard patch within 12 hours or as directed by MD Patient not taking: Reported on 07/28/2017 06/23/17 06/23/18  Loney Hering, MD  meclizine (ANTIVERT) 12.5 MG tablet Take 1 tablet (12.5 mg total)  by mouth 3 (three) times daily as needed for dizziness. 03/01/17   Hillary Bow, MD  nitroGLYCERIN (NITROSTAT) 0.4 MG SL tablet Place 0.4 mg under the tongue every 5 (five) minutes as needed for chest pain.    [provider]  predniSONE (STERAPRED UNI-PAK 21 TAB) 10 MG (21) TBPK tablet Take 6 tabs on day 1 Take 5 tabs on day 2 Take 4 tabs on day 3 Take 3 tabs on day 4 Take 2 tabs on day 5 Take 1 tab on day 6 Patient not taking: Reported on 07/28/2017 06/23/17   Loney Hering, MD  traMADol (ULTRAM) 50 MG tablet Take 1 tablet (50 mg total) by mouth every 6 (six) hours as needed for moderate pain. 05/29/17   Sable Feil, PA-C  umeclidinium-vilanterol (ANORO ELLIPTA) 62.5-25 MCG/INH AEPB Inhale 1 puff into the lungs daily. 10/02/16  Flora Lipps, MD    Allergies Patient has no known allergies.  Family History  Problem Relation Age of Onset  . Stroke Mother   . Heart attack Mother   . Hypertension Mother   . Heart attack Father   . Hypertension Father   . Heart attack Brother        #1  . Diabetes Brother        #1  . Heart disease Brother        #2  . Lung disease Brother        #2  . Hypertension Brother        #2  . Diabetes Brother        #2    Social History Social History   Tobacco Use  . Smoking status: Never Smoker  . Smokeless tobacco: Never Used  Substance Use Topics  . Alcohol use: No  . Drug use: No    Review of Systems  Constitutional: No fever/chills Eyes: No visual changes. ENT: No sore throat. Cardiovascular:chest pain. Respiratory:  shortness of breath. Gastrointestinal: No abdominal pain.  No nausea, no vomiting.  No diarrhea.  No constipation. Genitourinary: Negative for dysuria. Musculoskeletal: Negative for back pain. Skin: Negative for rash. Neurological: Negative for focal weakness    ____________________________________________   PHYSICAL EXAM:  VITAL SIGNS: ED Triage Vitals  Enc Vitals Group     BP 07/28/17  1920 (!) 165/84     Pulse Rate 07/28/17 1920 85     Resp 07/28/17 1920 20     Temp 07/28/17 1920 97.9 F (36.6 C)     Temp Source 07/28/17 1920 Oral     SpO2 07/28/17 1920 95 %     Weight 07/28/17 1921 280 lb (127 kg)     Height 07/28/17 1921 5\' 9"  (1.753 m)     Head Circumference --      Peak Flow --      Pain Score 07/28/17 1921 8     Pain Loc --      Pain Edu? --      Excl. in [place]? --     Constitutional: Alert and oriented. Well appearing and in no acute distress. Eyes: Conjunctivae are normal. \ Head: Atraumatic. Nose: No congestion/rhinnorhea. Mouth/Throat: Mucous membranes are moist.  Oropharynx non-erythematous. Neck: No stridor.  Cardiovascular: Normal rate, regular rhythm. Grossly normal heart sounds.  Good peripheral circulation. Respiratory: Normal respiratory effort.  No retractions. Lungs CTAB. Gastrointestinal: Soft and nontender. No distention. No abdominal bruits. No CVA tenderness. Musculoskeletal: No lower extremity tenderness nor edema.  No joint effusions. Neurologic:  Normal speech and language. No gross focal neurologic deficits are appreciated. No gait instability. Skin:  Skin is warm, dry and intact. No rash noted.   ____________________________________________   LABS (all labs ordered are listed, but only abnormal results are displayed)  Labs Reviewed  BASIC METABOLIC PANEL - Abnormal; Notable for the following components:      Result Value   CO2 21 (*)    Glucose, Bld 301 (*)    Creatinine, Ser 1.25 (*)    Calcium 8.5 (*)    All other components within normal limits  TROPONIN I - Abnormal; Notable for the following components:   Troponin I 0.23 (*)    All other components within normal limits  CBC  APTT  PROTIME-INR   ____________________________________________  EKG  EKG read and inserted by me shows normal sinus rhythm rate of 83 left axis patient has ST segment  depression in lead 1 and slightly in lead L which is  new. ____________________________________________  RADIOLOGY  ED MD interpretation: chest x-ray looks like there is possibly some congestive heart failure.  Official radiology report(s): Dg Chest 2 View  Result Date: 07/28/2017 CLINICAL DATA:  62 year old male with headache and left side chest pain onset at 1700 hr today. EXAM: CHEST  2 VIEW COMPARISON:  Chest CT 03/24/2017 and earlier. FINDINGS: Stable lung volumes. Mediastinal contours remain normal. Prior CABG. Pulmonary vascular congestion appears stable to 03/24/2017 (increased from 07/19/2016) with no pleural fluid identified. Otherwise chronic increased pulmonary interstitial markings and bilateral perihilar curvilinear scarring are unchanged. No pneumothorax. No acute osseous abnormality identified. Negative visible bowel gas pattern. IMPRESSION: 1. Pulmonary vascular congestion without overt edema similar to October radiographs. 2. No pleural effusion or other acute cardiopulmonary abnormality. Electronically Signed   By: Genevie Ann M.D.   On: 07/28/2017 19:39    ____________________________________________   PROCEDURES  Procedure(s) performed:  Procedures  Critical Care performed:  ____________________________________________   INITIAL IMPRESSION / ASSESSMENT AND PLAN / ED COURSE  patient is having crushing chest pain with some shortness of breath. Troponin is elevated EKG shows acute changes. We will give him aspirin and heparin. He takes L Oquist of the last dose was this morning. I discussed this with Dr. Tomasita Crumble shows who all day FOR now and used heparin.  patient has no obvious neurological changes       ____________________________________________   FINAL CLINICAL IMPRESSION(S) / ED DIAGNOSES  Final diagnoses:  NSTEMI (non-ST elevated myocardial infarction) ([place])  Bad headache     ED Discharge Orders    None       Note:  This document was prepared using Dragon voice recognition software and may  include unintentional dictation errors.    Nena Polio, MD 07/28/17 2030

## 2017-07-28 NOTE — Progress Notes (Signed)
ANTICOAGULATION CONSULT NOTE - Initial Consult  Pharmacy Consult for heparin Indication: chest pain/ACS  No Known Allergies  Patient Measurements: Height: 5\' 9"  (175.3 cm) Weight: 280 lb (127 kg) IBW/kg (Calculated) : 70.7 Heparin Dosing Weight: 100 kg  Vital Signs: Temp: 97.9 F (36.6 C) (02/12 1920) Temp Source: Oral (02/12 1920) BP: 165/84 (02/12 1920) Pulse Rate: 85 (02/12 1920)  Labs: Recent Labs    07/28/17 1918  HGB 13.9  HCT 40.9  PLT 151  CREATININE 1.25*  TROPONINI 0.23*    Estimated Creatinine Clearance: 81.8 mL/min (A) (by C-G formula based on SCr of 1.25 mg/dL (H)).   Medical History: Past Medical History:  Diagnosis Date  . A-fib ([place])   . Aortic stenosis, moderate 11/2015  . Arthritis   . CAD (coronary artery disease)   . Chronic systolic CHF (congestive heart failure) ([place])   . Diabetes mellitus without complication ([place])    INSULIN DEPENDENT  . GERD (gastroesophageal reflux disease)   . History of cardioversion   . Hypertension   . MI (myocardial infarction) ([place])   . OSA on CPAP   . Sepsis ([place])   . Shortness of breath dyspnea   . Stroke ([place])   . SVT (supraventricular tachycardia) ([place])     Assessment: Pharmacy consulted to dose and monitor heparin in this 62 year old male for ACS. Patient takes apixaban prior to admission with last reported dose at 0730 this morning.   Baseline labs have been ordered. If baseline HL is elevated, will need to dose adjust based on APTT.  Goal of Therapy:  Heparin level 0.3-0.7 units/ml aPTT 66-102 seconds Monitor platelets by anticoagulation protocol: Yes   Plan:  Give 4000 units bolus x 1 Start heparin infusion at 1200 units/hr Check anti-Xa level in 6 hours and daily while on heparin Continue to monitor H&H and platelets  Lenis Noon, PharmD, BCPS Clinical Pharmacist 07/28/2017,8:30 PM

## 2017-07-28 NOTE — ED Triage Notes (Addendum)
Pt presents to ED with c/o headache and left sided chest pain that started around 1700 this evening. Onset right after eating dinner. Used inhaler with no relief. Pt reports similar symptoms previously but cant remember dx or what medications he was given. MI about 2-3 years ago; pt states this does not feel the same. States he was wheezing "about all day" with occasional cough. Talkative with no increased work of breathing or acute distress noted at this time.

## 2017-07-29 ENCOUNTER — Inpatient Hospital Stay
Admit: 2017-07-29 | Discharge: 2017-07-29 | Disposition: A | Discharge status: Home / Self Care | Payer: Medicare HMO | Attending: Internal Medicine | Admitting: Internal Medicine

## 2017-07-29 DIAGNOSIS — R079 Chest pain, unspecified: Secondary | ICD-10-CM

## 2017-07-29 LAB — CBC
HCT: 40.5 % (ref 40.0–52.0)
HEMOGLOBIN: 13.3 g/dL (ref 13.0–18.0)
MCH: 29.6 pg (ref 26.0–34.0)
MCHC: 32.9 g/dL (ref 32.0–36.0)
MCV: 89.8 fL (ref 80.0–100.0)
Platelets: 133 10*3/uL — ABNORMAL LOW (ref 150–440)
RBC: 4.51 MIL/uL (ref 4.40–5.90)
RDW: 14.3 % (ref 11.5–14.5)
WBC: 6.8 10*3/uL (ref 3.8–10.6)

## 2017-07-29 LAB — TSH: TSH: 1.899 u[IU]/mL (ref 0.350–4.500)

## 2017-07-29 LAB — ECHOCARDIOGRAM COMPLETE
HEIGHTINCHES: 69 in
WEIGHTICAEL: 4611.2 [oz_av]

## 2017-07-29 LAB — GLUCOSE, CAPILLARY
GLUCOSE-CAPILLARY: 193 mg/dL — AB (ref 65–99)
Glucose-Capillary: 162 mg/dL — ABNORMAL HIGH (ref 65–99)
Glucose-Capillary: 171 mg/dL — ABNORMAL HIGH (ref 65–99)
Glucose-Capillary: 161 mg/dL — ABNORMAL HIGH (ref 65–99)

## 2017-07-29 LAB — HEPARIN LEVEL (UNFRACTIONATED)
HEPARIN UNFRACTIONATED: 0.27 [IU]/mL — AB (ref 0.30–0.70)
Heparin Unfractionated: 0.39 IU/mL (ref 0.30–0.70)
Heparin Unfractionated: 0.15 IU/mL — ABNORMAL LOW (ref 0.30–0.70)

## 2017-07-29 LAB — BASIC METABOLIC PANEL
Anion gap: 7 (ref 5–15)
BUN: 14 mg/dL (ref 6–20)
CHLORIDE: 110 mmol/L (ref 101–111)
CO2: 23 mmol/L (ref 22–32)
CREATININE: 0.78 mg/dL (ref 0.61–1.24)
Calcium: 8.5 mg/dL — ABNORMAL LOW (ref 8.9–10.3)
Glucose, Bld: 166 mg/dL — ABNORMAL HIGH (ref 65–99)
Potassium: 3.9 mmol/L (ref 3.5–5.1)
SODIUM: 140 mmol/L (ref 135–145)

## 2017-07-29 LAB — TROPONIN I
TROPONIN I: 15.24 ng/mL — AB (ref <0.03)
Troponin I: 8.37 ng/mL (ref <0.03)

## 2017-07-29 LAB — APTT: aPTT: 63 seconds — ABNORMAL HIGH (ref 24–36)

## 2017-07-29 MED ORDER — SODIUM CHLORIDE 0.9 % IV SOLN: 250.0000 mL | INTRAVENOUS | Status: DC | PRN

## 2017-07-29 MED ORDER — HEPARIN BOLUS VIA INFUSION
3000.0000 [IU] | Freq: Once | INTRAVENOUS | Status: AC
  Administered 2017-07-29: 3000 [IU] via INTRAVENOUS
  Filled 2017-07-29: qty 3000

## 2017-07-29 MED ORDER — SODIUM CHLORIDE 0.9 % IV SOLN
INTRAVENOUS | Status: DC
  Administered 2017-07-30: 11:00:00 via INTRAVENOUS

## 2017-07-29 MED ORDER — SODIUM CHLORIDE 0.9% FLUSH
3.0000 mL | Freq: Two times a day (BID) | INTRAVENOUS | Status: DC
  Administered 2017-07-29 – 2017-07-30 (×2): 3 mL via INTRAVENOUS

## 2017-07-29 MED ORDER — PERFLUTREN LIPID MICROSPHERE
1.0000 mL | INTRAVENOUS | Status: AC | PRN
  Administered 2017-07-29: 3 mL via INTRAVENOUS
  Filled 2017-07-29: qty 10

## 2017-07-29 MED ORDER — HEPARIN BOLUS VIA INFUSION
1500.0000 [IU] | Freq: Once | INTRAVENOUS | Status: AC
  Administered 2017-07-29: 1500 [IU] via INTRAVENOUS
  Filled 2017-07-29: qty 1500

## 2017-07-29 MED ORDER — HEPARIN (PORCINE) IN NACL 100-0.45 UNIT/ML-% IJ SOLN
1900.0000 [IU]/h | INTRAMUSCULAR | Status: DC
  Administered 2017-07-30: 1700 [IU]/h via INTRAVENOUS
  Filled 2017-07-29: qty 250

## 2017-07-29 MED ORDER — INSULIN DETEMIR 100 UNIT/ML ~~LOC~~ SOLN
50.0000 [IU] | Freq: Two times a day (BID) | SUBCUTANEOUS | Status: DC
  Administered 2017-07-29 – 2017-07-31 (×3): 50 [IU] via SUBCUTANEOUS
  Filled 2017-07-29 (×5): qty 0.5

## 2017-07-29 MED ORDER — ASPIRIN 81 MG PO CHEW
81.0000 mg | CHEWABLE_TABLET | ORAL | Status: AC
  Administered 2017-07-30: 81 mg via ORAL
  Filled 2017-07-29: qty 1

## 2017-07-29 MED ORDER — SODIUM CHLORIDE 0.9% FLUSH: 3.0000 mL | INTRAVENOUS | Status: DC | PRN

## 2017-07-29 NOTE — Progress Notes (Signed)
*  PRELIMINARY RESULTS* Echocardiogram 2D Echocardiogram has been performed.  Marcus Williams 07/29/2017, 11:12 AM

## 2017-07-29 NOTE — Progress Notes (Signed)
ANTICOAGULATION CONSULT NOTE - Initial Consult  Pharmacy Consult for heparin Indication: chest pain/ACS  No Known Allergies  Patient Measurements: Height: 5\' 9"  (175.3 cm) Weight: 288 lb 3.2 oz (130.7 kg) IBW/kg (Calculated) : 70.7 Heparin Dosing Weight: 100 kg  Vital Signs: Temp: 98.1 F (36.7 C) (02/13 0936) Temp Source: Oral (02/13 0936) BP: 112/66 (02/13 0928) Pulse Rate: 85 (02/13 0936)  Labs: Recent Labs    07/28/17 1918 07/28/17 2027 07/29/17 0200 07/29/17 0753  HGB 13.9  --   --  13.3  HCT 40.9  --   --  40.5  PLT 151  --   --  133*  APTT  --  32 63*  --   LABPROT  --  12.0  --   --   INR  --  0.89  --   --   HEPARINUNFRC  --  0.15* 0.39 0.15*  CREATININE 1.25*  --   --  0.78  TROPONINI 0.23*  --  8.37* 15.24*    Estimated Creatinine Clearance: 129.9 mL/min (by C-G formula based on SCr of 0.78 mg/dL).   Medical History: Past Medical History:  Diagnosis Date  . A-fib ([place])   . Aortic stenosis, moderate 11/2015  . Arthritis   . CAD (coronary artery disease)   . Chronic systolic CHF (congestive heart failure) ([place])   . Diabetes mellitus without complication ([place])    INSULIN DEPENDENT  . GERD (gastroesophageal reflux disease)   . History of cardioversion   . Hypertension   . MI (myocardial infarction) ([place])   . OSA on CPAP   . Sepsis ([place])   . Shortness of breath dyspnea   . Stroke ([place])   . SVT (supraventricular tachycardia) ([place])     Assessment: Pharmacy consulted to dose and monitor heparin in this 62 year old male for ACS. Patient takes apixaban prior to admission with last reported dose at 0730 this morning.   Baseline labs have been ordered. If baseline HL is elevated, will need to dose adjust based on APTT.  Goal of Therapy:  Heparin level 0.3-0.7 units/ml aPTT 66-102 seconds Monitor platelets by anticoagulation protocol: Yes   Plan:  Give 4000 units bolus x 1 Start heparin infusion at 1200 units/hr Check anti-Xa level in 6 hours  and daily while on heparin Continue to monitor H&H and platelets   02/13 @ 0200 HL 0.39, aPTT 63 sec. Since both levels are correlating, will dose off of HL. HL therapeutic, will continue current rate and will recheck HL @ 0800.   2/13@0930  HL 0.15, will give bolus of heparin 3000 units x 1, followed by rate increase to heparin iv 1500units/hr. Recheck HL in 6 hours and adjust as needed.   Thomasenia Sales, PharmD, [place] Clinical PharmacistS 07/29/2017,9:45 AM

## 2017-07-29 NOTE — Progress Notes (Signed)
CRITICAL VALUE ALERT  Critical Value:  Troponin 15.24   Date & Time Notied:  08:49 07/29/2017  Provider Notified: Dr. Manuella Ghazi  Orders Received/Actions taken: pt will receive cardiac cath tomorrow, heparin gtt continued

## 2017-07-29 NOTE — Consult Note (Signed)
[facility] Cardiology  CARDIOLOGY CONSULT NOTE  Patient ID: Marcus Williams MRN: [id number] DOB/AGE: 08/19/1955 62 y.o.  Admit date: 07/28/2017 Referring Physician Posey Pronto Primary Physician [facility] Primary Cardiologist Fath Reason for Consultation non-ST elevation myocardial infarction  HPI: 62 year old gentleman referred for evaluation of non-ST elevation myocardial infarction.  Patient has known coronary disease, status post CABG times 09/2005 at [facility].  He underwent cardiac catheterization 10/01/2016 which revealed occluded proximal LAD, occluded proximal left circumflex, occluded distal RCA with patent LIMA to LAD, patent SVG to OM1 with patent stent, patent SVG to D1 and patent SVG to RCA.  The patient was a you state of health until the past 2-3 days is noted worsening exertional dyspnea.  Last evening, he developed chest pain, described as someone sitting on his chest.  EKG revealed sinus rhythm with ST depression in leads I, aVL and V6 consistent with anterolateral wall ischemia.  Admission labs revealed troponin of 0.23 and 8.37.  Patient currently denies chest pain.  Review of systems complete and found to be negative unless listed above     Past Medical History:  Diagnosis Date  . A-fib ([place])   . Aortic stenosis, moderate 11/2015  . Arthritis   . CAD (coronary artery disease)   . Chronic systolic CHF (congestive heart failure) ([place])   . Diabetes mellitus without complication ([place])    INSULIN DEPENDENT  . GERD (gastroesophageal reflux disease)   . History of cardioversion   . Hypertension   . MI (myocardial infarction) ([place])   . OSA on CPAP   . Sepsis ([place])   . Shortness of breath dyspnea   . Stroke ([place])   . SVT (supraventricular tachycardia) (H[facility])     Past Surgical History:  Procedure Laterality Date  . 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 BUNIONECTOMY    . CARDIAC  CATHETERIZATION N/A 10/02/2015   Procedure: Coronary/Grafts Angiography;  Surgeon: Teodoro Spray, MD;  Location: [place] CV LAB;  Service: Cardiovascular;  Laterality: N/A;  . CARDIAC CATHETERIZATION N/A 11/21/2015   Procedure: Right and Left Heart Cath;  Surgeon: Sherren Mocha, MD;  Location: [place] [facility];  Service: Cardiovascular;  Laterality: N/A;  . CORONARY ARTERY BYPASS GRAFT    . KNEE SURGERY    . quadruple bypass    . TEE WITHOUT CARDIOVERSION  11/21/2015  . TEE WITHOUT CARDIOVERSION N/A 11/21/2015   Procedure: TRANSESOPHAGEAL ECHOCARDIOGRAM (TEE);  Surgeon: Larey Dresser, MD;  Location: [place];  Service: Cardiovascular;  Laterality: N/A;    Medications Prior to Admission  Medication Sig Dispense Refill Last Dose  . baclofen (LIORESAL) 10 MG tablet Take 1 tablet by mouth 2 (two) times daily.   07/28/2017 at Unknown time  . cyclobenzaprine (FLEXERIL) 10 MG tablet Take 1 tablet (10 mg total) by mouth 3 (three) times daily as needed. (Patient taking differently: Take 10 mg by mouth 2 (two) times daily. ) 15 tablet 0 07/28/2017 at Unknown time  . diltiazem (CARDIZEM CD) 300 MG 24 hr capsule Take 1 capsule (300 mg total) by mouth daily. 30 capsule 5 07/28/2017 at Unknown time  . ELIQUIS 5 MG TABS tablet Take 1 tablet by mouth 2 (two) times daily.   07/28/2017 at Unknown time  . gabapentin (NEURONTIN) 300 MG capsule Take 300 mg by mouth 2 (two) times daily.   07/28/2017 at Unknown time  . insulin NPH Human (  HUMULIN N,NOVOLIN N) 100 UNIT/ML injection Inject 100 Units into the skin 2 (two) times daily.    07/28/2017 at Unknown time  . lisinopril (PRINIVIL,ZESTRIL) 20 MG tablet Take 20 mg by mouth daily.   07/28/2017 at Unknown time  . metFORMIN (GLUCOPHAGE) 1000 MG tablet Take 1,000 mg by mouth 2 (two) times daily with a meal.   07/28/2017 at Unknown time  . metoprolol (LOPRESSOR) 100 MG tablet Take 100 mg by mouth 2 (two) times daily.   07/28/2017 at Unknown time  . pantoprazole  (PROTONIX) 40 MG tablet Take 40 mg by mouth daily.   07/28/2017 at Unknown time  . potassium chloride 20 MEQ TBCR Take 20 mEq by mouth daily. 34 tablet 6 07/28/2017 at Unknown time  . pramipexole (MIRAPEX) 0.5 MG tablet Take 1 tablet by mouth 3 (three) times daily.   07/28/2017 at Unknown time  . simvastatin (ZOCOR) 40 MG tablet Take 40 mg by mouth at bedtime.    07/27/2017 at Unknown time  . torsemide (DEMADEX) 20 MG tablet TAKE 3 TABLETS (60 MG TOTAL) BY MOUTH DAILY. 180 tablet 2 07/28/2017 at Unknown time  . traZODone (DESYREL) 100 MG tablet Take 100 mg by mouth at bedtime.   07/27/2017 at Unknown time  . albuterol (PROVENTIL HFA;VENTOLIN HFA) 108 (90 Base) MCG/ACT inhaler Inhale 2 puffs into the lungs every 6 (six) hours as needed for wheezing or shortness of breath. 1 Inhaler 10 PRN at PRN  . aspirin 81 MG EC tablet Take 1 tablet (81 mg total) by mouth daily. (Patient not taking: Reported on 07/28/2017)   Not Taking at Unknown time  . ferrous sulfate 325 (65 FE) MG tablet Take 1 tablet (325 mg total) by mouth daily with breakfast. (Patient not taking: Reported on 07/28/2017) 90 tablet 2 Not Taking at Unknown time  . ketorolac (TORADOL) 10 MG tablet Take 1 tablet (10 mg total) by mouth every 6 (six) hours as needed. 20 tablet 0 PRN at PRN  . lidocaine (LIDODERM) 5 % Place 1 patch onto the skin every 12 (twelve) hours. Remove & Discard patch within 12 hours or as directed by MD (Patient not taking: Reported on 07/28/2017) 10 patch 0 Not Taking at Unknown time  . meclizine (ANTIVERT) 12.5 MG tablet Take 1 tablet (12.5 mg total) by mouth 3 (three) times daily as needed for dizziness. 30 tablet 0 PRN at PRN  . nitroGLYCERIN (NITROSTAT) 0.4 MG SL tablet Place 0.4 mg under the tongue every 5 (five) minutes as needed for chest pain.   PRN at PRN  . predniSONE (STERAPRED UNI-PAK 21 TAB) 10 MG (21) TBPK tablet Take 6 tabs on day 1 Take 5 tabs on day 2 Take 4 tabs on day 3 Take 3 tabs on day 4 Take 2 tabs on day  5 Take 1 tab on day 6 (Patient not taking: Reported on 07/28/2017) 21 tablet 0 Not Taking at Unknown time  . traMADol (ULTRAM) 50 MG tablet Take 1 tablet (50 mg total) by mouth every 6 (six) hours as needed for moderate pain. 12 tablet 0 PRN at PRN  . umeclidinium-vilanterol (ANORO ELLIPTA) 62.5-25 MCG/INH AEPB Inhale 1 puff into the lungs daily. 60 each 4 PRN at PRN   Social History   Socioeconomic History  . Marital status: Married    Spouse name: Not on file  . Number of children: Not on file  . Years of education: Not on file  . Highest education level: Not on file  Social  Needs  . Financial resource strain: Not on file  . Food insecurity - worry: Not on file  . Food insecurity - inability: Not on file  . Transportation needs - medical: Not on file  . Transportation needs - non-medical: Not on file  Occupational History  . Not on file  Tobacco Use  . Smoking status: Never Smoker  . Smokeless tobacco: Never Used  Substance and Sexual Activity  . Alcohol use: No  . Drug use: No  . Sexual activity: Yes  Other Topics Concern  . Not on file  Social History Narrative  . Not on file    Family History  Problem Relation Age of Onset  . Stroke Mother   . Heart attack Mother   . Hypertension Mother   . Heart attack Father   . Hypertension Father   . Heart attack Brother        #1  . Diabetes Brother        #1  . Heart disease Brother        #2  . Lung disease Brother        #2  . Hypertension Brother        #2  . Diabetes Brother        #2      Review of systems complete and found to be negative unless listed above      PHYSICAL EXAM  General: Well developed, well nourished, in no acute distress HEENT:  Normocephalic and atramatic Neck:  No JVD.  Lungs: Clear bilaterally to auscultation and percussion. Heart: HRRR . Normal S1 and S2 without gallops or murmurs.  Abdomen: Bowel sounds are positive, abdomen soft and non-tender  Msk:  Back normal, normal gait.  Normal strength and tone for age. Extremities: No clubbing, cyanosis or edema.   Neuro: Alert and oriented X 3. Psych:  Good affect, responds appropriately  Labs:   Lab Results  Component Value Date   WBC 6.9 07/28/2017   HGB 13.9 07/28/2017   HCT 40.9 07/28/2017   MCV 88.4 07/28/2017   PLT 151 07/28/2017    Recent Labs  Lab 07/28/17 1918  NA 139  K 3.6  CL 107  CO2 21*  BUN 16  CREATININE 1.25*  CALCIUM 8.5*  GLUCOSE 301*   Lab Results  Component Value Date   CKTOTAL 160 08/23/2012   CKMB 5.2 (H) 10/14/2014   TROPONINI 8.37 (HH) 07/29/2017    Lab Results  Component Value Date   CHOL 142 03/01/2017   CHOL 104 07/20/2014   CHOL 152 07/01/2014   Lab Results  Component Value Date   HDL 31 (L) 03/01/2017   HDL 43 07/20/2014   HDL 32 (L) 07/01/2014   Lab Results  Component Value Date   LDLCALC 70 03/01/2017   LDLCALC 35 07/20/2014   LDLCALC 84 07/01/2014   Lab Results  Component Value Date   TRIG 206 (H) 03/01/2017   TRIG 131 07/20/2014   TRIG 178 07/01/2014   Lab Results  Component Value Date   CHOLHDL 4.6 03/01/2017   No results found for: LDLDIRECT    Radiology: Dg Chest 2 View  Result Date: 07/28/2017 CLINICAL DATA:  62 year old male with headache and left side chest pain onset at 1700 hr today. EXAM: CHEST  2 VIEW COMPARISON:  Chest CT 03/24/2017 and earlier. FINDINGS: Stable lung volumes. Mediastinal contours remain normal. Prior CABG. Pulmonary vascular congestion appears stable to 03/24/2017 (increased from 07/19/2016) with no pleural fluid identified.  Otherwise chronic increased pulmonary interstitial markings and bilateral perihilar curvilinear scarring are unchanged. No pneumothorax. No acute osseous abnormality identified. Negative visible bowel gas pattern. IMPRESSION: 1. Pulmonary vascular congestion without overt edema similar to October radiographs. 2. No pleural effusion or other acute cardiopulmonary abnormality. Electronically Signed    By: Genevie Ann M.D.   On: 07/28/2017 19:39    EKG: Sinus rhythm, ST depression in leads I, aVL and V6  ASSESSMENT AND PLAN:   1.  Non-ST elevation myocardial infarction, EKG suggestive of anterolateral wall ischemia consistent with sensing bypass graft to D1, currently chest pain-free 2.  Paroxysmal atrial fibrillation, on Eliquis for stroke prevention, last dose 07/28/2017  Recommendations  1.  Agree with current therapy 2.  Continue heparin drip 3.  Plan for cardiac catheterization 07/30/2017 after holding Eliquis x2 doses  Signed: Isaias Cowman MD,PhD, [facility] 07/29/2017, 8:24 AM

## 2017-07-29 NOTE — Progress Notes (Signed)
Per RN, patient has refused cpap for the night

## 2017-07-29 NOTE — Progress Notes (Signed)
CRITICAL VALUE: troponin jumped from 0.23 to 8.37  MD NOTIFIED: Dr. Marcille Blanco notified  RESPONSE: pt already on a heparin gtt & cardiology has been consulted, Dr. Marcille Blanco gave orders to make pt NPO in case cardiology wants to proceed with a procedure today.  Pt with no active chest pain right now.

## 2017-07-29 NOTE — Progress Notes (Signed)
[place] [facility] [place] NAME: Marcus Williams    MR#:  [id number]  DATE OF BIRTH:  12/17/1955  SUBJECTIVE:  CHIEF COMPLAINT:   Chief Complaint  Patient presents with  . Chest Pain  . Headache  Afraid of cardiac catheterization (tells me story of bad outcome in one of the family member).  No symptoms, son at bedside REVIEW OF SYSTEMS:  Review of Systems  Constitutional: Negative for chills, fever and weight loss.  HENT: Negative for nosebleeds and sore throat.   Eyes: Negative for blurred vision.  Respiratory: Negative for cough, shortness of breath and wheezing.   Cardiovascular: Negative for chest pain, orthopnea, leg swelling and PND.  Gastrointestinal: Negative for abdominal pain, constipation, diarrhea, heartburn, nausea and vomiting.  Genitourinary: Negative for dysuria and urgency.  Musculoskeletal: Negative for back pain.  Skin: Negative for rash.  Neurological: Negative for dizziness, speech change, focal weakness and headaches.  Endo/Heme/Allergies: Does not bruise/bleed easily.  Psychiatric/Behavioral: Negative for depression.    DRUG ALLERGIES:  No Known Allergies VITALS:  Blood pressure (!) 128/51, pulse 71, temperature 98.4 F (36.9 C), temperature source Oral, resp. rate 18, height 5\' 9"  (1.753 m), weight 130.7 kg (288 lb 3.2 oz), SpO2 97 %. PHYSICAL EXAMINATION:  Physical Exam  Constitutional: He is oriented to person, place, and time and well-developed, well-nourished, and in no distress.  HENT:  Head: Normocephalic and atraumatic.  Eyes: Conjunctivae and EOM are normal. Pupils are equal, round, and reactive to light.  Neck: Normal range of motion. Neck supple. No tracheal deviation present. No thyromegaly present.  Cardiovascular: Normal rate, regular rhythm and normal heart sounds.  Pulmonary/Chest: Effort normal and breath sounds normal. No respiratory distress. He has no wheezes. He exhibits no tenderness.    Abdominal: Soft. Bowel sounds are normal. He exhibits no distension. There is no tenderness.  Musculoskeletal: Normal range of motion.  Neurological: He is alert and oriented to person, place, and time. No cranial nerve deficit.  Skin: Skin is warm and dry. No rash noted.  Psychiatric: Mood and affect normal.   LABORATORY PANEL:  Male CBC Recent Labs  Lab 07/29/17 0753  WBC 6.8  HGB 13.3  HCT 40.5  PLT 133*   ------------------------------------------------------------------------------------------------------------------ Chemistries  Recent Labs  Lab 07/29/17 0753  NA 140  K 3.9  CL 110  CO2 23  GLUCOSE 166*  BUN 14  CREATININE 0.78  CALCIUM 8.5*   RADIOLOGY:  Dg Chest 2 View  Result Date: 07/28/2017 CLINICAL DATA:  62 year old male with headache and left side chest pain onset at 1700 hr today. EXAM: CHEST  2 VIEW COMPARISON:  Chest CT 03/24/2017 and earlier. FINDINGS: Stable lung volumes. Mediastinal contours remain normal. Prior CABG. Pulmonary vascular congestion appears stable to 03/24/2017 (increased from 07/19/2016) with no pleural fluid identified. Otherwise chronic increased pulmonary interstitial markings and bilateral perihilar curvilinear scarring are unchanged. No pneumothorax. No acute osseous abnormality identified. Negative visible bowel gas pattern. IMPRESSION: 1. Pulmonary vascular congestion without overt edema similar to October radiographs. 2. No pleural effusion or other acute cardiopulmonary abnormality. Electronically Signed   By: Genevie Ann M.D.   On: 07/28/2017 19:39   ASSESSMENT AND PLAN:  Patient is a 62 year old white male with history of coronary artery disease presenting with chest pressure  1.  Acute non-STEMI - Dr. Stephani Police planning for cardiac catheterization tomorrow morning - Continue therapy with aspirin & heparin - Continue Nitropatch  2.  Diabetes type 2 - continue his home insulin, hold metformin -Add sliding scale  insulin  3.  History of atrial fibrillation continue Cardizem hold Eliquis  4.  Sleep apnea use CPAP at nighttime  5.  Neuropathy continue gabapentin  6.  hypertension continue lisinopril  - IV hydralazine as needed       All the records are reviewed and case discussed with Care Management/Social Worker. Management plans discussed with the patient, family and they are in agreement.  CODE STATUS: Full Code  TOTAL TIME TAKING CARE OF THIS PATIENT: 35 minutes.   More than 50% of the time was spent in counseling/coordination of care: YES  POSSIBLE D/C IN 1-2 DAYS, DEPENDING ON CLINICAL CONDITION.  And cardiac evaluation   Max Sane M.D on 07/29/2017 at 4:47 PM  Between 7am to 6pm - Pager - [phone]  After 6pm go to [email or web address] - Proofreader  [facility]  Office  [phone]  CC: Primary care physician; Idelle Crouch, MD  Note: This dictation was prepared with Dragon dictation along with smaller phrase technology. Any transcriptional errors that result from this process are unintentional.

## 2017-07-29 NOTE — Progress Notes (Signed)
ANTICOAGULATION CONSULT NOTE - Initial Consult  Pharmacy Consult for heparin Indication: chest pain/ACS  No Known Allergies  Patient Measurements: Height: 5\' 9"  (175.3 cm) Weight: 288 lb 3.2 oz (130.7 kg) IBW/kg (Calculated) : 70.7 Heparin Dosing Weight: 100 kg  Vital Signs: Temp: 98.4 F (36.9 C) (02/13 1645) Temp Source: Oral (02/13 1645) BP: 128/51 (02/13 1645) Pulse Rate: 71 (02/13 1645)  Labs: Recent Labs    07/28/17 1918  07/28/17 2027 07/29/17 0200 07/29/17 0753 07/29/17 1556  HGB 13.9  --   --   --  13.3  --   HCT 40.9  --   --   --  40.5  --   PLT 151  --   --   --  133*  --   APTT  --   --  32 63*  --   --   LABPROT  --   --  12.0  --   --   --   INR  --   --  0.89  --   --   --   HEPARINUNFRC  --    < > 0.15* 0.39 0.15* 0.27*  CREATININE 1.25*  --   --   --  0.78  --   TROPONINI 0.23*  --   --  8.37* 15.24*  --    < > = values in this interval not displayed.    Estimated Creatinine Clearance: 129.9 mL/min (by C-G formula based on SCr of 0.78 mg/dL).   Medical History: Past Medical History:  Diagnosis Date  . A-fib ([place])   . Aortic stenosis, moderate 11/2015  . Arthritis   . CAD (coronary artery disease)   . Chronic systolic CHF (congestive heart failure) ([place])   . Diabetes mellitus without complication ([place])    INSULIN DEPENDENT  . GERD (gastroesophageal reflux disease)   . History of cardioversion   . Hypertension   . MI (myocardial infarction) ([place])   . OSA on CPAP   . Sepsis ([place])   . Shortness of breath dyspnea   . Stroke ([place])   . SVT (supraventricular tachycardia) ([place])     Assessment: Pharmacy consulted to dose and monitor heparin in this 62 year old male for ACS. Patient takes apixaban prior to admission with last reported dose at 0730 on 2/12.   Baseline HL and APTT were both subtherapeutic, therefore, adjusting/monitoring using HL.  Goal of Therapy:  Heparin level 0.3-0.7 units/ml aPTT 66-102 seconds Monitor platelets by  anticoagulation protocol: Yes   Plan:  HL = 0.27 is slightly subtherapeutic on infusion of 1500 units/hr. Will give 1500 unit bolus and increase heparin infusion to 1700 units/hr. HL to be checked in 6 hours. CBC ordered with AM labs tomorrow.  Lenis Noon, PharmD, BCPS Clinical Pharmacist 07/29/2017,5:52 PM

## 2017-07-29 NOTE — Plan of Care (Signed)
Pt admitted last night with chest pain, headache and shortness of breath, troponins were positive. Pt on heparin gtt. Complained of pain once, treated with oxycodone, and pt currently has nitro paste on. Pt given one PRN breathing treatment due to having some wheezing. Tried to where CPAP provided but was unable.

## 2017-07-29 NOTE — Progress Notes (Signed)
ANTICOAGULATION CONSULT NOTE - Initial Consult  Pharmacy Consult for heparin Indication: chest pain/ACS  No Known Allergies  Patient Measurements: Height: 5\' 9"  (175.3 cm) Weight: 288 lb 3.2 oz (130.7 kg) IBW/kg (Calculated) : 70.7 Heparin Dosing Weight: 100 kg  Vital Signs: Temp: 97.8 F (36.6 C) (02/12 2229) Temp Source: Oral (02/12 2229) BP: 151/60 (02/12 2229) Pulse Rate: 92 (02/12 2229)  Labs: Recent Labs    07/28/17 1918 07/28/17 2027 07/29/17 0200  HGB 13.9  --   --   HCT 40.9  --   --   PLT 151  --   --   APTT  --  32 63*  LABPROT  --  12.0  --   INR  --  0.89  --   HEPARINUNFRC  --  0.15* 0.39  CREATININE 1.25*  --   --   TROPONINI 0.23*  --  8.37*    Estimated Creatinine Clearance: 83.1 mL/min (A) (by C-G formula based on SCr of 1.25 mg/dL (H)).   Medical History: Past Medical History:  Diagnosis Date  . A-fib ([place])   . Aortic stenosis, moderate 11/2015  . Arthritis   . CAD (coronary artery disease)   . Chronic systolic CHF (congestive heart failure) ([place])   . Diabetes mellitus without complication ([place])    INSULIN DEPENDENT  . GERD (gastroesophageal reflux disease)   . History of cardioversion   . Hypertension   . MI (myocardial infarction) ([place])   . OSA on CPAP   . Sepsis ([place])   . Shortness of breath dyspnea   . Stroke ([place])   . SVT (supraventricular tachycardia) ([place])     Assessment: Pharmacy consulted to dose and monitor heparin in this 62 year old male for ACS. Patient takes apixaban prior to admission with last reported dose at 0730 this morning.   Baseline labs have been ordered. If baseline HL is elevated, will need to dose adjust based on APTT.  Goal of Therapy:  Heparin level 0.3-0.7 units/ml aPTT 66-102 seconds Monitor platelets by anticoagulation protocol: Yes   Plan:  Give 4000 units bolus x 1 Start heparin infusion at 1200 units/hr Check anti-Xa level in 6 hours and daily while on heparin Continue to monitor H&H and  platelets   02/13 @ 0200 HL 0.39, aPTT 63 sec. Since both levels are correlating, will dose off of HL. HL therapeutic, will continue current rate and will recheck HL @ 0800.  Tobie Lords, PharmD, BCPS Clinical Pharmacist 07/29/2017,3:22 AM

## 2017-07-30 ENCOUNTER — Encounter
Admission: EM | Disposition: A | Discharge status: Home / Self Care | Payer: Self-pay | Source: Home / Self Care | Attending: Internal Medicine

## 2017-07-30 DIAGNOSIS — Z955 Presence of coronary angioplasty implant and graft: Secondary | ICD-10-CM | POA: Diagnosis not present

## 2017-07-30 HISTORY — PX: CORONARY STENT INTERVENTION: CATH118234

## 2017-07-30 HISTORY — PX: RIGHT/LEFT HEART CATH AND CORONARY/GRAFT ANGIOGRAPHY: CATH118267

## 2017-07-30 LAB — BASIC METABOLIC PANEL
ANION GAP: 9 (ref 5–15)
BUN: 19 mg/dL (ref 6–20)
CHLORIDE: 106 mmol/L (ref 101–111)
CO2: 25 mmol/L (ref 22–32)
Calcium: 8.3 mg/dL — ABNORMAL LOW (ref 8.9–10.3)
Creatinine, Ser: 1.07 mg/dL (ref 0.61–1.24)
GFR calc Af Amer: >60 mL/min (ref >60)
GFR calc non Af Amer: >60 mL/min (ref >60)
Glucose, Bld: 167 mg/dL — ABNORMAL HIGH (ref 65–99)
POTASSIUM: 3.7 mmol/L (ref 3.5–5.1)
SODIUM: 140 mmol/L (ref 135–145)

## 2017-07-30 LAB — HEPARIN LEVEL (UNFRACTIONATED)
HEPARIN UNFRACTIONATED: 0.28 [IU]/mL — AB (ref 0.30–0.70)
Heparin Unfractionated: 0.57 IU/mL (ref 0.30–0.70)
Heparin Unfractionated: <0.1 IU/mL — ABNORMAL LOW (ref 0.30–0.70)

## 2017-07-30 LAB — GLUCOSE, CAPILLARY
GLUCOSE-CAPILLARY: 97 mg/dL (ref 65–99)
Glucose-Capillary: 184 mg/dL — ABNORMAL HIGH (ref 65–99)
Glucose-Capillary: 140 mg/dL — ABNORMAL HIGH (ref 65–99)
Glucose-Capillary: 138 mg/dL — ABNORMAL HIGH (ref 65–99)

## 2017-07-30 LAB — CBC
HCT: 38.2 % — ABNORMAL LOW (ref 40.0–52.0)
HEMOGLOBIN: 12.8 g/dL — AB (ref 13.0–18.0)
MCH: 29.7 pg (ref 26.0–34.0)
MCHC: 33.5 g/dL (ref 32.0–36.0)
MCV: 88.6 fL (ref 80.0–100.0)
Platelets: 153 10*3/uL (ref 150–440)
RBC: 4.31 MIL/uL — AB (ref 4.40–5.90)
RDW: 13.9 % (ref 11.5–14.5)
WBC: 7.9 10*3/uL (ref 3.8–10.6)

## 2017-07-30 LAB — POCT ACTIVATED CLOTTING TIME: Activated Clotting Time: 351 seconds

## 2017-07-30 SURGERY — RIGHT/LEFT HEART CATH AND CORONARY/GRAFT ANGIOGRAPHY
Anesthesia: Moderate Sedation

## 2017-07-30 MED ORDER — LABETALOL HCL 5 MG/ML IV SOLN: 10.0000 mg | INTRAVENOUS | Status: AC | PRN

## 2017-07-30 MED ORDER — MIDAZOLAM HCL 2 MG/2ML IJ SOLN
INTRAMUSCULAR | Status: AC
  Filled 2017-07-30: qty 2

## 2017-07-30 MED ORDER — CLOPIDOGREL BISULFATE 75 MG PO TABS
75.0000 mg | ORAL_TABLET | Freq: Every day | ORAL | Status: DC
  Administered 2017-07-31: 75 mg via ORAL
  Filled 2017-07-30: qty 1

## 2017-07-30 MED ORDER — ASPIRIN 81 MG PO CHEW
CHEWABLE_TABLET | ORAL | Status: AC
  Filled 2017-07-30: qty 3

## 2017-07-30 MED ORDER — NITROGLYCERIN 5 MG/ML IV SOLN
INTRAVENOUS | Status: AC
  Filled 2017-07-30: qty 10

## 2017-07-30 MED ORDER — BIVALIRUDIN TRIFLUOROACETATE 250 MG IV SOLR
INTRAVENOUS | Status: AC | PRN
  Administered 2017-07-30: 1.75 mg/kg/h via INTRAVENOUS

## 2017-07-30 MED ORDER — ONDANSETRON HCL 4 MG/2ML IJ SOLN: 4.0000 mg | Freq: Four times a day (QID) | INTRAMUSCULAR | Status: DC | PRN

## 2017-07-30 MED ORDER — SODIUM CHLORIDE 0.9% FLUSH: 3.0000 mL | INTRAVENOUS | Status: DC | PRN

## 2017-07-30 MED ORDER — CLOPIDOGREL BISULFATE 75 MG PO TABS
ORAL_TABLET | ORAL | Status: AC
  Filled 2017-07-30: qty 8

## 2017-07-30 MED ORDER — FENTANYL CITRATE (PF) 100 MCG/2ML IJ SOLN
INTRAMUSCULAR | Status: AC
  Filled 2017-07-30: qty 2

## 2017-07-30 MED ORDER — TIROFIBAN HCL IN NACL 5-0.9 MG/100ML-% IV SOLN
0.1500 ug/kg/min | INTRAVENOUS | Status: AC
  Administered 2017-07-30: 0.15 ug/kg/min via INTRAVENOUS
  Filled 2017-07-30 (×5): qty 100

## 2017-07-30 MED ORDER — SODIUM CHLORIDE 0.9 % IV SOLN: 250.0000 mL | INTRAVENOUS | Status: DC | PRN

## 2017-07-30 MED ORDER — ASPIRIN 81 MG PO CHEW
81.0000 mg | CHEWABLE_TABLET | Freq: Every day | ORAL | Status: DC
  Administered 2017-07-31: 81 mg via ORAL
  Filled 2017-07-30: qty 1

## 2017-07-30 MED ORDER — NITROGLYCERIN 1 MG/10 ML FOR IR/CATH LAB
INTRA_ARTERIAL | Status: DC | PRN
  Administered 2017-07-30: 300 ug via INTRACORONARY

## 2017-07-30 MED ORDER — TIROFIBAN HCL IV 12.5 MG/250 ML
INTRAVENOUS | Status: AC | PRN
  Administered 2017-07-30: 0.15 ug/kg/min via INTRAVENOUS

## 2017-07-30 MED ORDER — BIVALIRUDIN TRIFLUOROACETATE 250 MG IV SOLR
INTRAVENOUS | Status: AC
  Filled 2017-07-30: qty 250

## 2017-07-30 MED ORDER — BIVALIRUDIN BOLUS VIA INFUSION - CUPID
INTRAVENOUS | Status: DC | PRN
  Administered 2017-07-30: 97.05 mg via INTRAVENOUS

## 2017-07-30 MED ORDER — SODIUM CHLORIDE 0.9 % WEIGHT BASED INFUSION
1.0000 mL/kg/h | INTRAVENOUS | Status: AC
  Administered 2017-07-30: 1 mL/kg/h via INTRAVENOUS

## 2017-07-30 MED ORDER — BIVALIRUDIN TRIFLUOROACETATE 250 MG IV SOLR
INTRAVENOUS | Status: AC
Start: 2017-07-30 — End: 2017-07-30
  Filled 2017-07-30: qty 250

## 2017-07-30 MED ORDER — MIDAZOLAM HCL 2 MG/2ML IJ SOLN
INTRAMUSCULAR | Status: DC | PRN
  Administered 2017-07-30: 1 mg via INTRAVENOUS

## 2017-07-30 MED ORDER — HYDRALAZINE HCL 20 MG/ML IJ SOLN: 5.0000 mg | INTRAMUSCULAR | Status: AC | PRN

## 2017-07-30 MED ORDER — IOPAMIDOL (ISOVUE-300) INJECTION 61%
INTRAVENOUS | Status: DC | PRN
  Administered 2017-07-30: 195 mL via INTRAVENOUS

## 2017-07-30 MED ORDER — TIROFIBAN HCL IV 12.5 MG/250 ML
INTRAVENOUS | Status: AC
Start: 2017-07-30 — End: 2017-07-30
  Filled 2017-07-30: qty 250

## 2017-07-30 MED ORDER — HEPARIN BOLUS VIA INFUSION
1500.0000 [IU] | Freq: Once | INTRAVENOUS | Status: AC
  Administered 2017-07-30: 1500 [IU] via INTRAVENOUS
  Filled 2017-07-30: qty 1500

## 2017-07-30 MED ORDER — SODIUM CHLORIDE 0.9% FLUSH
3.0000 mL | Freq: Two times a day (BID) | INTRAVENOUS | Status: DC
  Administered 2017-07-31: 3 mL via INTRAVENOUS

## 2017-07-30 MED ORDER — ACETAMINOPHEN 325 MG PO TABS: 650.0000 mg | ORAL_TABLET | ORAL | Status: DC | PRN

## 2017-07-30 MED ORDER — TIROFIBAN (AGGRASTAT) BOLUS VIA INFUSION
INTRAVENOUS | Status: DC | PRN
  Administered 2017-07-30: 3235 ug via INTRAVENOUS

## 2017-07-30 MED ORDER — ALPRAZOLAM 0.5 MG PO TABS
0.5000 mg | ORAL_TABLET | Freq: Two times a day (BID) | ORAL | Status: DC | PRN
  Administered 2017-07-30: 0.5 mg via ORAL
  Filled 2017-07-30 (×2): qty 1

## 2017-07-30 MED ORDER — ASPIRIN 81 MG PO CHEW
CHEWABLE_TABLET | ORAL | Status: DC | PRN
  Administered 2017-07-30: 324 mg via ORAL

## 2017-07-30 MED ORDER — HEPARIN (PORCINE) IN NACL 2-0.9 UNIT/ML-% IJ SOLN
INTRAMUSCULAR | Status: AC
  Filled 2017-07-30: qty 500

## 2017-07-30 MED ORDER — FENTANYL CITRATE (PF) 100 MCG/2ML IJ SOLN
INTRAMUSCULAR | Status: DC | PRN
  Administered 2017-07-30: 25 ug via INTRAVENOUS

## 2017-07-30 MED ORDER — CLOPIDOGREL BISULFATE 75 MG PO TABS
ORAL_TABLET | ORAL | Status: DC | PRN
Start: 2017-07-30 — End: 2017-07-30
  Administered 2017-07-30: 600 mg via ORAL

## 2017-07-30 SURGICAL SUPPLY — 24 items
BALLN TREK RX 2.25X12 (BALLOONS) ×4
BALLOON TREK RX 2.25X12 (BALLOONS) ×2 IMPLANT
CATH 5FR JL4 DIAGNOSTIC (CATHETERS) ×4 IMPLANT
CATH INFINITI 5 FR IM (CATHETERS) ×4 IMPLANT
CATH INFINITI 5FR ANG PIGTAIL (CATHETERS) IMPLANT
CATH INFINITI 5FR JL5 (CATHETERS) ×4 IMPLANT
CATH INFINITI JR4 5F (CATHETERS) ×4 IMPLANT
CATH LANGSTON DUAL LUM PIG 6FR (CATHETERS) ×4 IMPLANT
CATH SWANZ 7F THERMO (CATHETERS) ×4 IMPLANT
CATH VISTA GUIDE 6FR MPA1 (CATHETERS) ×4 IMPLANT
DEVICE CLOSURE MYNXGRIP 6/7F (Vascular Products) ×8 IMPLANT
DEVICE INFLAT 30 PLUS (MISCELLANEOUS) ×4 IMPLANT
KIT MANI 3VAL PERCEP (MISCELLANEOUS) ×4 IMPLANT
KIT RIGHT HEART (MISCELLANEOUS) ×4 IMPLANT
NEEDLE PERC 18GX7CM (NEEDLE) ×4 IMPLANT
PACK CARDIAC CATH (CUSTOM PROCEDURE TRAY) ×4 IMPLANT
SHEATH AVANTI 5FR X 11CM (SHEATH) ×4 IMPLANT
SHEATH AVANTI 6FR X 11CM (SHEATH) ×4 IMPLANT
SHEATH PINNACLE 5F 10CM (SHEATH) ×4 IMPLANT
SHEATH PINNACLE 7F 10CM (SHEATH) ×4 IMPLANT
STENT SIERRA 2.75 X 15 MM (Permanent Stent) ×4 IMPLANT
WIRE ASAHI PROWATER 180CM (WIRE) ×4 IMPLANT
WIRE EMERALD 3MM-J .035X260CM (WIRE) ×4 IMPLANT
WIRE EMERALD ST .035X150CM (WIRE) ×4 IMPLANT

## 2017-07-30 NOTE — Progress Notes (Signed)
ANTICOAGULATION CONSULT NOTE - Initial Consult  Pharmacy Consult for heparin Indication: chest pain/ACS  No Known Allergies  Patient Measurements: Height: 5\' 9"  (175.3 cm) Weight: 285 lb 3.2 oz (129.4 kg) IBW/kg (Calculated) : 70.7 Heparin Dosing Weight: 100 kg  Vital Signs: Temp: 97.8 F (36.6 C) (02/14 0338) Temp Source: Oral (02/14 0338) BP: 123/55 (02/14 0338) Pulse Rate: 50 (02/14 0338)  Labs: Recent Labs    07/28/17 1918  07/28/17 2027 07/29/17 0200 07/29/17 0753 07/29/17 1556 07/30/17 0015 07/30/17 0529  HGB 13.9  --   --   --  13.3  --  12.8*  --   HCT 40.9  --   --   --  40.5  --  38.2*  --   PLT 151  --   --   --  133*  --  153  --   APTT  --   --  32 63*  --   --   --   --   LABPROT  --   --  12.0  --   --   --   --   --   INR  --   --  0.89  --   --   --   --   --   HEPARINUNFRC  --    < > 0.15* 0.39 0.15* 0.27*  --  0.28*  CREATININE 1.25*  --   --   --  0.78  --  1.07  --   TROPONINI 0.23*  --   --  8.37* 15.24*  --   --   --    < > = values in this interval not displayed.    Estimated Creatinine Clearance: 96.6 mL/min (by C-G formula based on SCr of 1.07 mg/dL).   Medical History: Past Medical History:  Diagnosis Date  . A-fib ([place])   . Aortic stenosis, moderate 11/2015  . Arthritis   . CAD (coronary artery disease)   . Chronic systolic CHF (congestive heart failure) ([place])   . Diabetes mellitus without complication ([place])    INSULIN DEPENDENT  . GERD (gastroesophageal reflux disease)   . History of cardioversion   . Hypertension   . MI (myocardial infarction) ([place])   . OSA on CPAP   . Sepsis ([place])   . Shortness of breath dyspnea   . Stroke ([place])   . SVT (supraventricular tachycardia) ([place])     Assessment: Pharmacy consulted to dose and monitor heparin in this 62 year old male for ACS. Patient takes apixaban prior to admission with last reported dose at 0730 on 2/12.   Baseline HL and APTT were both subtherapeutic, therefore,  adjusting/monitoring using HL.  Goal of Therapy:  Heparin level 0.3-0.7 units/ml aPTT 66-102 seconds Monitor platelets by anticoagulation protocol: Yes   Plan:  HL = 0.27 is slightly subtherapeutic on infusion of 1500 units/hr. Will give 1500 unit bolus and increase heparin infusion to 1700 units/hr. HL to be checked in 6 hours. CBC ordered with AM labs tomorrow.  02/14 @ 0530 HL 0.28 subtherapeutic. HL delayed d/t lab equipment issue. Will rebolus w/ heparin 1500 units IV x 1 and increase rate to 1900 units/hr and will recheck HL @ 1200. CBC steadily trending down, but stable.  Tobie Lords, PharmD, BCPS Clinical Pharmacist 07/30/2017,6:32 AM

## 2017-07-30 NOTE — Progress Notes (Signed)
ANTICOAGULATION CONSULT NOTE - Initial Consult  Pharmacy Consult for heparin Indication: chest pain/ACS  No Known Allergies  Patient Measurements: Height: 5\' 9"  (175.3 cm) Weight: 285 lb 3.2 oz (129.4 kg) IBW/kg (Calculated) : 70.7 Heparin Dosing Weight: 100 kg  Vital Signs: Temp: 97.8 F (36.6 C) (02/14 1308) Temp Source: Oral (02/14 1308) BP: 146/68 (02/14 1308) Pulse Rate: 65 (02/14 1308)  Labs: Recent Labs    07/28/17 1918  07/28/17 2027 07/29/17 0200 07/29/17 0753 07/29/17 1556 07/30/17 0015 07/30/17 0529 07/30/17 1145  HGB 13.9  --   --   --  13.3  --  12.8*  --   --   HCT 40.9  --   --   --  40.5  --  38.2*  --   --   PLT 151  --   --   --  133*  --  153  --   --   APTT  --   --  32 63*  --   --   --   --   --   LABPROT  --   --  12.0  --   --   --   --   --   --   INR  --   --  0.89  --   --   --   --   --   --   HEPARINUNFRC  --    < > 0.15* 0.39 0.15* 0.27*  --  0.28* 0.57  CREATININE 1.25*  --   --   --  0.78  --  1.07  --   --   TROPONINI 0.23*  --   --  8.37* 15.24*  --   --   --   --    < > = values in this interval not displayed.    Estimated Creatinine Clearance: 96.6 mL/min (by C-G formula based on SCr of 1.07 mg/dL).   Medical History: Past Medical History:  Diagnosis Date  . A-fib ([place])   . Aortic stenosis, moderate 11/2015  . Arthritis   . CAD (coronary artery disease)   . Chronic systolic CHF (congestive heart failure) ([place])   . Diabetes mellitus without complication ([place])    INSULIN DEPENDENT  . GERD (gastroesophageal reflux disease)   . History of cardioversion   . Hypertension   . MI (myocardial infarction) ([place])   . OSA on CPAP   . Sepsis ([place])   . Shortness of breath dyspnea   . Stroke ([place])   . SVT (supraventricular tachycardia) ([place])     Assessment: Pharmacy consulted to dose and monitor heparin in this 62 year old male for ACS. Patient takes apixaban prior to admission with last reported dose at 0730 on 2/12.    Baseline HL and APTT were both subtherapeutic, therefore, adjusting/monitoring using HL.  Goal of Therapy:  Heparin level 0.3-0.7 units/ml aPTT 66-102 seconds Monitor platelets by anticoagulation protocol: Yes   Plan:  HL = 0.27 is slightly subtherapeutic on infusion of 1500 units/hr. Will give 1500 unit bolus and increase heparin infusion to 1700 units/hr. HL to be checked in 6 hours. CBC ordered with AM labs tomorrow.  02/14 @ 0530 HL 0.28 subtherapeutic. HL delayed d/t lab equipment issue. Will rebolus w/ heparin 1500 units IV x 1 and increase rate to 1900 units/hr and will recheck HL @ 1200. CBC steadily trending down, but stable.  2/14@ 1350 HL 0.57, will continue heparin iv 1900 units/hr and recheck in 6 hours.  Donna Christen Ellison Leisure, PharmD, BCPS Clinical Pharmacist 07/30/2017,2:49 PM

## 2017-07-30 NOTE — Progress Notes (Signed)
Pt refuses to wear Cpap. Cpap is no longer in the room. Pt aware if needed a hospital unit will be provided.

## 2017-07-30 NOTE — Plan of Care (Signed)
  Progressing Clinical Measurements: Ability to maintain clinical measurements within normal limits will improve 07/30/2017 2333 - Progressing by Liliane Channel, RN Pain Managment: General experience of comfort will improve 07/30/2017 2333 - Progressing by Liliane Channel, RN Safety: Ability to remain free from injury will improve 07/30/2017 2333 - Progressing by Liliane Channel, RN Cardiac: Ability to achieve and maintain adequate cardiovascular perfusion will improve 07/30/2017 2333 - Progressing by Liliane Channel, RN

## 2017-07-30 NOTE — Progress Notes (Signed)
Pt complaining of acid reflux. Page prime awaiting order. Will continue to monitor.

## 2017-07-30 NOTE — Progress Notes (Signed)
[facility] NAME: Marcus Williams    MR#:  [id number]  DATE OF BIRTH:  September 08, 1955  SUBJECTIVE:  CHIEF COMPLAINT:   Chief Complaint  Patient presents with  . Chest Pain  . Headache  no complaints, waiting for cath REVIEW OF SYSTEMS:  Review of Systems  Constitutional: Negative for chills, fever and weight loss.  HENT: Negative for nosebleeds and sore throat.   Eyes: Negative for blurred vision.  Respiratory: Negative for cough, shortness of breath and wheezing.   Cardiovascular: Negative for chest pain, orthopnea, leg swelling and PND.  Gastrointestinal: Negative for abdominal pain, constipation, diarrhea, heartburn, nausea and vomiting.  Genitourinary: Negative for dysuria and urgency.  Musculoskeletal: Negative for back pain.  Skin: Negative for rash.  Neurological: Negative for dizziness, speech change, focal weakness and headaches.  Endo/Heme/Allergies: Does not bruise/bleed easily.  Psychiatric/Behavioral: Negative for depression.    DRUG ALLERGIES:  No Known Allergies VITALS:  Blood pressure (!) 143/69, pulse (!) 58, temperature 97.8 F (36.6 C), temperature source Oral, resp. rate (!) 21, height 5\' 9"  (1.753 m), weight 129.4 kg (285 lb 3.2 oz), SpO2 96 %. PHYSICAL EXAMINATION:  Physical Exam  Constitutional: He is oriented to person, place, and time and well-developed, well-nourished, and in no distress.  HENT:  Head: Normocephalic and atraumatic.  Eyes: Conjunctivae and EOM are normal. Pupils are equal, round, and reactive to light.  Neck: Normal range of motion. Neck supple. No tracheal deviation present. No thyromegaly present.  Cardiovascular: Normal rate, regular rhythm and normal heart sounds.  Pulmonary/Chest: Effort normal and breath sounds normal. No respiratory distress. He has no wheezes. He exhibits no tenderness.  Abdominal: Soft. Bowel sounds are normal. He exhibits no distension. There is no  tenderness.  Musculoskeletal: Normal range of motion.  Neurological: He is alert and oriented to person, place, and time. No cranial nerve deficit.  Skin: Skin is warm and dry. No rash noted.  Psychiatric: Mood and affect normal.   LABORATORY PANEL:  Male CBC Recent Labs  Lab 07/30/17 0015  WBC 7.9  HGB 12.8*  HCT 38.2*  PLT 153   ------------------------------------------------------------------------------------------------------------------ Chemistries  Recent Labs  Lab 07/30/17 0015  NA 140  K 3.7  CL 106  CO2 25  GLUCOSE 167*  BUN 19  CREATININE 1.07  CALCIUM 8.3*   RADIOLOGY:  No results found. ASSESSMENT AND PLAN:  Patient is a 62 year old white male with history of coronary artery disease presenting with chest pressure  1.  Acute non-STEMI - Dr. Stephani Police planning for cardiac catheterization today - Continue therapy with aspirin & heparin - Continue Nitropatch  2.  Diabetes type 2 - continue his home insulin, hold metformin - sliding scale insulin  3.  History of atrial fibrillation continue Cardizem hold Eliquis  4.  Sleep apnea use CPAP at nighttime  5.  Neuropathy continue gabapentin  6.  hypertension continue lisinopril  - IV hydralazine as needed       All the records are reviewed and case discussed with Care Management/Social Worker. Management plans discussed with the patient, nursing and they are in agreement.  CODE STATUS: Full Code  TOTAL TIME TAKING CARE OF THIS PATIENT: 35 minutes.   More than 50% of the time was spent in counseling/coordination of care: YES  POSSIBLE D/C IN 1 DAYS, DEPENDING ON CLINICAL CONDITION.  And cardiac evaluation   Max Sane M.D on 07/30/2017 at 4:55  PM  Between 7am to 6pm - Pager - [phone]  After 6pm go to [email or web address] - Proofreader  [facility]  Office  [phone]  CC: Primary care physician; Idelle Crouch, MD  Note: This dictation was  prepared with Dragon dictation along with smaller phrase technology. Any transcriptional errors that result from this process are unintentional.

## 2017-07-30 NOTE — Progress Notes (Signed)
ANTICOAGULATION CONSULT NOTE - Initial Consult  Pharmacy Consult for tirofiban Indication: 18 hours post-PCI  No Known Allergies  Patient Measurements: Height: 5\' 9"  (175.3 cm) Weight: 285 lb 3.2 oz (129.4 kg) IBW/kg (Calculated) : 70.7  Vital Signs: Temp: 97.8 F (36.6 C) (02/14 1308) Temp Source: Oral (02/14 1308) BP: 149/71 (02/14 1522) Pulse Rate: 62 (02/14 1522)  Labs: Recent Labs    07/28/17 1918  07/28/17 2027 07/29/17 0200 07/29/17 0753 07/29/17 1556 07/30/17 0015 07/30/17 0529 07/30/17 1145  HGB 13.9  --   --   --  13.3  --  12.8*  --   --   HCT 40.9  --   --   --  40.5  --  38.2*  --   --   PLT 151  --   --   --  133*  --  153  --   --   APTT  --   --  32 63*  --   --   --   --   --   LABPROT  --   --  12.0  --   --   --   --   --   --   INR  --   --  0.89  --   --   --   --   --   --   HEPARINUNFRC  --    < > 0.15* 0.39 0.15* 0.27*  --  0.28* 0.57  CREATININE 1.25*  --   --   --  0.78  --  1.07  --   --   TROPONINI 0.23*  --   --  8.37* 15.24*  --   --   --   --    < > = values in this interval not displayed.    Estimated Creatinine Clearance: 96.6 mL/min (by C-G formula based on SCr of 1.07 mg/dL).   Medical History: Past Medical History:  Diagnosis Date  . A-fib ([place])   . Aortic stenosis, moderate 11/2015  . Arthritis   . CAD (coronary artery disease)   . Chronic systolic CHF (congestive heart failure) ([place])   . Diabetes mellitus without complication ([place])    INSULIN DEPENDENT  . GERD (gastroesophageal reflux disease)   . History of cardioversion   . Hypertension   . MI (myocardial infarction) ([place])   . OSA on CPAP   . Sepsis ([place])   . Shortness of breath dyspnea   . Stroke ([place])   . SVT (supraventricular tachycardia) ([facility])    Assessment: Per MAR, tirofiban bolus given at 1438 today in cath lab.  Plan:  Entered order for tirofiban 0.15 mcg/kg/min for 18 hours  Lenis Noon, PharmD, BCPS Clinical Pharmacist 07/30/2017,3:31 PM

## 2017-07-31 ENCOUNTER — Encounter: Payer: Self-pay | Admitting: Cardiology

## 2017-07-31 ENCOUNTER — Other Ambulatory Visit: Payer: Self-pay

## 2017-07-31 ENCOUNTER — Emergency Department: Payer: Medicare HMO

## 2017-07-31 ENCOUNTER — Observation Stay
Admission: EM | Admit: 2017-07-31 | Discharge: 2017-08-02 | Disposition: A | Discharge status: Home / Self Care | Payer: Medicare HMO | Attending: Internal Medicine | Admitting: Internal Medicine

## 2017-07-31 DIAGNOSIS — T380X5A Adverse effect of glucocorticoids and synthetic analogues, initial encounter: Secondary | ICD-10-CM | POA: Diagnosis not present

## 2017-07-31 DIAGNOSIS — K219 Gastro-esophageal reflux disease without esophagitis: Secondary | ICD-10-CM | POA: Insufficient documentation

## 2017-07-31 DIAGNOSIS — R0600 Dyspnea, unspecified: Secondary | ICD-10-CM

## 2017-07-31 DIAGNOSIS — E1165 Type 2 diabetes mellitus with hyperglycemia: Secondary | ICD-10-CM | POA: Diagnosis not present

## 2017-07-31 DIAGNOSIS — Z7901 Long term (current) use of anticoagulants: Secondary | ICD-10-CM | POA: Insufficient documentation

## 2017-07-31 DIAGNOSIS — Z955 Presence of coronary angioplasty implant and graft: Secondary | ICD-10-CM | POA: Insufficient documentation

## 2017-07-31 DIAGNOSIS — I482 Chronic atrial fibrillation: Secondary | ICD-10-CM | POA: Diagnosis not present

## 2017-07-31 DIAGNOSIS — I5022 Chronic systolic (congestive) heart failure: Secondary | ICD-10-CM | POA: Diagnosis not present

## 2017-07-31 DIAGNOSIS — G2581 Restless legs syndrome: Secondary | ICD-10-CM | POA: Insufficient documentation

## 2017-07-31 DIAGNOSIS — Z79899 Other long term (current) drug therapy: Secondary | ICD-10-CM | POA: Insufficient documentation

## 2017-07-31 DIAGNOSIS — J441 Chronic obstructive pulmonary disease with (acute) exacerbation: Secondary | ICD-10-CM | POA: Diagnosis not present

## 2017-07-31 DIAGNOSIS — G4733 Obstructive sleep apnea (adult) (pediatric): Secondary | ICD-10-CM | POA: Diagnosis not present

## 2017-07-31 DIAGNOSIS — J9601 Acute respiratory failure with hypoxia: Secondary | ICD-10-CM | POA: Diagnosis not present

## 2017-07-31 DIAGNOSIS — I110 Hypertensive heart disease with heart failure: Secondary | ICD-10-CM | POA: Diagnosis not present

## 2017-07-31 DIAGNOSIS — I2510 Atherosclerotic heart disease of native coronary artery without angina pectoris: Secondary | ICD-10-CM | POA: Diagnosis not present

## 2017-07-31 DIAGNOSIS — Z951 Presence of aortocoronary bypass graft: Secondary | ICD-10-CM | POA: Insufficient documentation

## 2017-07-31 DIAGNOSIS — I350 Nonrheumatic aortic (valve) stenosis: Secondary | ICD-10-CM | POA: Insufficient documentation

## 2017-07-31 DIAGNOSIS — Z89421 Acquired absence of other right toe(s): Secondary | ICD-10-CM | POA: Insufficient documentation

## 2017-07-31 DIAGNOSIS — Z8249 Family history of ischemic heart disease and other diseases of the circulatory system: Secondary | ICD-10-CM | POA: Insufficient documentation

## 2017-07-31 DIAGNOSIS — I252 Old myocardial infarction: Secondary | ICD-10-CM | POA: Insufficient documentation

## 2017-07-31 DIAGNOSIS — Z7902 Long term (current) use of antithrombotics/antiplatelets: Secondary | ICD-10-CM | POA: Insufficient documentation

## 2017-07-31 DIAGNOSIS — Z794 Long term (current) use of insulin: Secondary | ICD-10-CM | POA: Insufficient documentation

## 2017-07-31 DIAGNOSIS — R0602 Shortness of breath: Secondary | ICD-10-CM | POA: Diagnosis not present

## 2017-07-31 DIAGNOSIS — Z9989 Dependence on other enabling machines and devices: Secondary | ICD-10-CM | POA: Insufficient documentation

## 2017-07-31 DIAGNOSIS — Z8673 Personal history of transient ischemic attack (TIA), and cerebral infarction without residual deficits: Secondary | ICD-10-CM | POA: Insufficient documentation

## 2017-07-31 DIAGNOSIS — E1140 Type 2 diabetes mellitus with diabetic neuropathy, unspecified: Secondary | ICD-10-CM | POA: Insufficient documentation

## 2017-07-31 DIAGNOSIS — Z6841 Body Mass Index (BMI) 40.0 and over, adult: Secondary | ICD-10-CM | POA: Insufficient documentation

## 2017-07-31 DIAGNOSIS — I471 Supraventricular tachycardia: Secondary | ICD-10-CM | POA: Insufficient documentation

## 2017-07-31 LAB — BASIC METABOLIC PANEL
Anion gap: 10 (ref 5–15)
Anion gap: 10 (ref 5–15)
BUN: 14 mg/dL (ref 6–20)
BUN: 21 mg/dL — AB (ref 6–20)
CALCIUM: 8.7 mg/dL — AB (ref 8.9–10.3)
CALCIUM: 8.7 mg/dL — AB (ref 8.9–10.3)
CHLORIDE: 105 mmol/L (ref 101–111)
CO2: 21 mmol/L — ABNORMAL LOW (ref 22–32)
CO2: 22 mmol/L (ref 22–32)
CREATININE: 0.96 mg/dL (ref 0.61–1.24)
CREATININE: 1.1 mg/dL (ref 0.61–1.24)
Chloride: 107 mmol/L (ref 101–111)
GFR calc Af Amer: >60 mL/min (ref >60)
GFR calc non Af Amer: >60 mL/min (ref >60)
GFR calc non Af Amer: >60 mL/min (ref >60)
GLUCOSE: 208 mg/dL — AB (ref 65–99)
Glucose, Bld: 212 mg/dL — ABNORMAL HIGH (ref 65–99)
Potassium: 4.5 mmol/L (ref 3.5–5.1)
Potassium: 4.2 mmol/L (ref 3.5–5.1)
Sodium: 138 mmol/L (ref 135–145)
Sodium: 137 mmol/L (ref 135–145)

## 2017-07-31 LAB — CBC
HCT: 39.3 % — ABNORMAL LOW (ref 40.0–52.0)
Hemoglobin: 13 g/dL (ref 13.0–18.0)
MCH: 29.7 pg (ref 26.0–34.0)
MCHC: 33.2 g/dL (ref 32.0–36.0)
MCV: 89.5 fL (ref 80.0–100.0)
PLATELETS: 148 10*3/uL — AB (ref 150–440)
RBC: 4.39 MIL/uL — ABNORMAL LOW (ref 4.40–5.90)
RDW: 14.4 % (ref 11.5–14.5)
WBC: 7.7 10*3/uL (ref 3.8–10.6)

## 2017-07-31 LAB — GLUCOSE, CAPILLARY
GLUCOSE-CAPILLARY: 218 mg/dL — AB (ref 65–99)
GLUCOSE-CAPILLARY: 346 mg/dL — AB (ref 65–99)
Glucose-Capillary: 185 mg/dL — ABNORMAL HIGH (ref 65–99)
Glucose-Capillary: 231 mg/dL — ABNORMAL HIGH (ref 65–99)

## 2017-07-31 LAB — TROPONIN I: TROPONIN I: 3.44 ng/mL — AB (ref <0.03)

## 2017-07-31 LAB — CBC WITH DIFFERENTIAL/PLATELET
BASOS ABS: 0.1 10*3/uL (ref 0–0.1)
BASOS PCT: 1 %
EOS ABS: 0.1 10*3/uL (ref 0–0.7)
Eosinophils Relative: 2 %
HEMATOCRIT: 38.9 % — AB (ref 40.0–52.0)
HEMOGLOBIN: 13.2 g/dL (ref 13.0–18.0)
Lymphocytes Relative: 12 %
Lymphs Abs: 1 10*3/uL (ref 1.0–3.6)
MCH: 30 pg (ref 26.0–34.0)
MCHC: 33.9 g/dL (ref 32.0–36.0)
MCV: 88.6 fL (ref 80.0–100.0)
Monocytes Absolute: 0.7 10*3/uL (ref 0.2–1.0)
Monocytes Relative: 9 %
NEUTROS ABS: 6.3 10*3/uL (ref 1.4–6.5)
NEUTROS PCT: 76 %
Platelets: 148 10*3/uL — ABNORMAL LOW (ref 150–440)
RBC: 4.39 MIL/uL — ABNORMAL LOW (ref 4.40–5.90)
RDW: 14.6 % — AB (ref 11.5–14.5)
WBC: 8.2 10*3/uL (ref 3.8–10.6)

## 2017-07-31 LAB — PROTIME-INR
INR: 0.91
PROTHROMBIN TIME: 12.2 s (ref 11.4–15.2)

## 2017-07-31 LAB — BRAIN NATRIURETIC PEPTIDE: B Natriuretic Peptide: 663 pg/mL — ABNORMAL HIGH (ref 0.0–100.0)

## 2017-07-31 LAB — INFLUENZA PANEL BY PCR (TYPE A & B)
INFLAPCR: NEGATIVE
Influenza B By PCR: NEGATIVE

## 2017-07-31 LAB — APTT: aPTT: 35 seconds (ref 24–36)

## 2017-07-31 MED ORDER — DILTIAZEM HCL ER COATED BEADS 180 MG PO CP24
300.0000 mg | ORAL_CAPSULE | Freq: Every day | ORAL | Status: DC
  Administered 2017-07-31 – 2017-08-02 (×3): 300 mg via ORAL
  Filled 2017-07-31 (×3): qty 1

## 2017-07-31 MED ORDER — UMECLIDINIUM-VILANTEROL 62.5-25 MCG/INH IN AEPB
1.0000 | INHALATION_SPRAY | Freq: Every day | RESPIRATORY_TRACT | Status: DC
  Administered 2017-08-01 – 2017-08-02 (×2): 1 via RESPIRATORY_TRACT
  Filled 2017-07-31: qty 14

## 2017-07-31 MED ORDER — METHYLPREDNISOLONE SODIUM SUCC 125 MG IJ SOLR
125.0000 mg | Freq: Once | INTRAMUSCULAR | Status: AC
  Administered 2017-07-31: 125 mg via INTRAVENOUS
  Filled 2017-07-31: qty 2

## 2017-07-31 MED ORDER — METOPROLOL TARTRATE 50 MG PO TABS
100.0000 mg | ORAL_TABLET | Freq: Two times a day (BID) | ORAL | Status: DC
Start: 2017-07-31 — End: 2017-08-02
  Administered 2017-07-31 – 2017-08-02 (×4): 100 mg via ORAL
  Filled 2017-07-31 (×4): qty 2

## 2017-07-31 MED ORDER — TORSEMIDE 20 MG PO TABS
60.0000 mg | ORAL_TABLET | Freq: Every day | ORAL | Status: DC
  Administered 2017-08-01 – 2017-08-02 (×2): 60 mg via ORAL
  Filled 2017-07-31: qty 3

## 2017-07-31 MED ORDER — FUROSEMIDE 10 MG/ML IJ SOLN
60.0000 mg | Freq: Once | INTRAMUSCULAR | Status: AC
  Administered 2017-07-31: 60 mg via INTRAVENOUS

## 2017-07-31 MED ORDER — METFORMIN HCL 500 MG PO TABS
1000.0000 mg | ORAL_TABLET | Freq: Two times a day (BID) | ORAL | Status: DC
  Administered 2017-08-02: 1000 mg via ORAL
  Filled 2017-07-31: qty 2

## 2017-07-31 MED ORDER — ALBUTEROL SULFATE (2.5 MG/3ML) 0.083% IN NEBU
5.0000 mg | INHALATION_SOLUTION | Freq: Once | RESPIRATORY_TRACT | Status: AC
  Administered 2017-07-31: 5 mg via RESPIRATORY_TRACT
  Filled 2017-07-31: qty 6

## 2017-07-31 MED ORDER — GUAIFENESIN-DM 100-10 MG/5ML PO SYRP
5.0000 mL | ORAL_SOLUTION | ORAL | Status: DC | PRN
  Administered 2017-07-31 – 2017-08-01 (×2): 5 mL via ORAL
  Filled 2017-07-31 (×2): qty 5

## 2017-07-31 MED ORDER — PANTOPRAZOLE SODIUM 40 MG PO TBEC
40.0000 mg | DELAYED_RELEASE_TABLET | Freq: Every day | ORAL | Status: DC
  Administered 2017-07-31 – 2017-08-02 (×3): 40 mg via ORAL
  Filled 2017-07-31 (×3): qty 1

## 2017-07-31 MED ORDER — IPRATROPIUM-ALBUTEROL 0.5-2.5 (3) MG/3ML IN SOLN
3.0000 mL | Freq: Four times a day (QID) | RESPIRATORY_TRACT | Status: DC
  Administered 2017-07-31 – 2017-08-02 (×5): 3 mL via RESPIRATORY_TRACT
  Filled 2017-07-31 (×5): qty 3

## 2017-07-31 MED ORDER — ACETAMINOPHEN 325 MG PO TABS
650.0000 mg | ORAL_TABLET | Freq: Four times a day (QID) | ORAL | Status: DC | PRN
  Administered 2017-08-01 – 2017-08-02 (×2): 650 mg via ORAL
  Filled 2017-07-31 (×2): qty 2

## 2017-07-31 MED ORDER — INSULIN NPH (HUMAN) (ISOPHANE) 100 UNIT/ML ~~LOC~~ SUSP: 100.0000 [IU] | Freq: Two times a day (BID) | SUBCUTANEOUS | Status: DC

## 2017-07-31 MED ORDER — CLOPIDOGREL BISULFATE 75 MG PO TABS: 75.0000 mg | ORAL_TABLET | Freq: Every day | ORAL | 0 refills | Status: AC

## 2017-07-31 MED ORDER — GABAPENTIN 300 MG PO CAPS
300.0000 mg | ORAL_CAPSULE | Freq: Two times a day (BID) | ORAL | Status: DC
  Administered 2017-07-31 – 2017-08-02 (×4): 300 mg via ORAL
  Filled 2017-07-31 (×4): qty 1

## 2017-07-31 MED ORDER — APIXABAN 5 MG PO TABS
5.0000 mg | ORAL_TABLET | Freq: Two times a day (BID) | ORAL | Status: DC
  Administered 2017-07-31 – 2017-08-02 (×4): 5 mg via ORAL
  Filled 2017-07-31 (×4): qty 1

## 2017-07-31 MED ORDER — BACLOFEN 10 MG PO TABS
10.0000 mg | ORAL_TABLET | Freq: Two times a day (BID) | ORAL | Status: DC
  Administered 2017-07-31 – 2017-08-02 (×4): 10 mg via ORAL
  Filled 2017-07-31 (×5): qty 1

## 2017-07-31 MED ORDER — METHYLPREDNISOLONE SODIUM SUCC 40 MG IJ SOLR
40.0000 mg | Freq: Two times a day (BID) | INTRAMUSCULAR | Status: DC
  Administered 2017-08-01 – 2017-08-02 (×3): 40 mg via INTRAVENOUS
  Filled 2017-07-31 (×3): qty 1

## 2017-07-31 MED ORDER — ACETAMINOPHEN 650 MG RE SUPP: 650.0000 mg | Freq: Four times a day (QID) | RECTAL | Status: DC | PRN

## 2017-07-31 MED ORDER — BUDESONIDE 0.5 MG/2ML IN SUSP
0.5000 mg | Freq: Two times a day (BID) | RESPIRATORY_TRACT | Status: DC
  Administered 2017-07-31 – 2017-08-02 (×4): 0.5 mg via RESPIRATORY_TRACT
  Filled 2017-07-31 (×4): qty 2

## 2017-07-31 MED ORDER — MECLIZINE HCL 12.5 MG PO TABS
12.5000 mg | ORAL_TABLET | Freq: Three times a day (TID) | ORAL | Status: DC | PRN
  Filled 2017-07-31: qty 1

## 2017-07-31 MED ORDER — PRAMIPEXOLE DIHYDROCHLORIDE 0.25 MG PO TABS
0.5000 mg | ORAL_TABLET | Freq: Three times a day (TID) | ORAL | Status: DC
  Administered 2017-07-31 – 2017-08-02 (×5): 0.5 mg via ORAL
  Filled 2017-07-31 (×5): qty 2

## 2017-07-31 MED ORDER — ONDANSETRON HCL 4 MG PO TABS: 4.0000 mg | ORAL_TABLET | Freq: Four times a day (QID) | ORAL | Status: DC | PRN

## 2017-07-31 MED ORDER — ALBUTEROL SULFATE (2.5 MG/3ML) 0.083% IN NEBU: 3.0000 mL | INHALATION_SOLUTION | Freq: Four times a day (QID) | RESPIRATORY_TRACT | Status: DC | PRN

## 2017-07-31 MED ORDER — ONDANSETRON HCL 4 MG/2ML IJ SOLN: 4.0000 mg | Freq: Four times a day (QID) | INTRAMUSCULAR | Status: DC | PRN

## 2017-07-31 MED ORDER — POTASSIUM CHLORIDE CRYS ER 20 MEQ PO TBCR
20.0000 meq | EXTENDED_RELEASE_TABLET | Freq: Every day | ORAL | Status: DC
  Administered 2017-08-01 – 2017-08-02 (×2): 20 meq via ORAL
  Filled 2017-07-31 (×2): qty 1

## 2017-07-31 MED ORDER — LISINOPRIL 20 MG PO TABS
20.0000 mg | ORAL_TABLET | Freq: Every day | ORAL | Status: DC
  Administered 2017-08-01 – 2017-08-02 (×2): 20 mg via ORAL
  Filled 2017-07-31 (×3): qty 1

## 2017-07-31 MED ORDER — INSULIN DETEMIR 100 UNIT/ML ~~LOC~~ SOLN
100.0000 [IU] | Freq: Two times a day (BID) | SUBCUTANEOUS | Status: DC
  Administered 2017-07-31 – 2017-08-02 (×4): 100 [IU] via SUBCUTANEOUS
  Filled 2017-07-31 (×5): qty 1

## 2017-07-31 MED ORDER — SIMVASTATIN 20 MG PO TABS
40.0000 mg | ORAL_TABLET | Freq: Every day | ORAL | Status: DC
  Administered 2017-07-31 – 2017-08-01 (×2): 40 mg via ORAL
  Filled 2017-07-31 (×3): qty 2

## 2017-07-31 MED ORDER — NITROGLYCERIN 0.4 MG SL SUBL: 0.4000 mg | SUBLINGUAL_TABLET | SUBLINGUAL | Status: DC | PRN

## 2017-07-31 MED ORDER — TRAZODONE HCL 100 MG PO TABS
100.0000 mg | ORAL_TABLET | Freq: Every day | ORAL | Status: DC
  Administered 2017-07-31 – 2017-08-01 (×2): 100 mg via ORAL
  Filled 2017-07-31 (×2): qty 1

## 2017-07-31 MED ORDER — CLOPIDOGREL BISULFATE 75 MG PO TABS
75.0000 mg | ORAL_TABLET | Freq: Every day | ORAL | Status: DC
  Administered 2017-08-01 – 2017-08-02 (×2): 75 mg via ORAL
  Filled 2017-07-31 (×2): qty 1

## 2017-07-31 NOTE — Discharge Instructions (Signed)
Coronary Angiogram With Stent, Care After °This sheet gives you information about how to care for yourself after your procedure. Your health care provider may also give you more specific instructions. If you have problems or questions, contact your health care provider. °What can I expect after the procedure? °After your procedure, it is common to have: °· Bruising in the area where a small, thin tube (catheter) was inserted. This usually fades within 1-2 weeks. °· Blood collecting in the tissue (hematoma) that may be painful to the touch. It should usually decrease in size and tenderness within 1-2 weeks. ° °Follow these instructions at home: °Insertion area care °· Do not take baths, swim, or use a hot tub until your health care provider approves. °· You may shower 24-48 hours after the procedure or as directed by your health care provider. °· Follow instructions from your health care provider about how to take care of your incision. Make sure you: °? Wash your hands with soap and water before you change your bandage (dressing). If soap and water are not available, use hand sanitizer. °? Change your dressing as told by your health care provider. °? Leave stitches (sutures), skin glue, or adhesive strips in place. These skin closures may need to stay in place for 2 weeks or longer. If adhesive strip edges start to loosen and curl up, you may trim the loose edges. Do not remove adhesive strips completely unless your health care provider tells you to do that. °· Remove the bandage (dressing) and gently wash the catheter insertion site with plain soap and water. °· Pat the area dry with a clean towel. Do not rub the area, because that may cause bleeding. °· Do not apply powder or lotion to the incision area. °· Check your incision area every day for signs of infection. Check for: °? More redness, swelling, or pain. °? More fluid or blood. °? Warmth. °? Pus or a bad smell. °Activity °· Do not drive for 24 hours if you  were given a medicine to help you relax (sedative). °· Do not lift anything that is heavier than 10 lb (4.5 kg) for 5 days after your procedure or as directed by your health care provider. °· Ask your health care provider when it is okay for you: °? To return to work or school. °? To resume usual physical activities or sports. °? To resume sexual activity. °Eating and drinking °· Eat a heart-healthy diet. This should include plenty of fresh fruits and vegetables. °· Avoid the following types of food: °? Food that is high in salt. °? Canned or highly processed food. °? Food that is high in saturated fat or sugar. °? Fried food. °· Limit alcohol intake to no more than 1 drink a day for non-pregnant women and 2 drinks a day for men. One drink equals 12 oz of beer, 5 oz of wine, or 1½ oz of hard liquor. °Lifestyle °· Do not use any products that contain nicotine or tobacco, such as cigarettes and e-cigarettes. If you need help quitting, ask your health care provider. °· Take steps to manage and control your weight. °· Get regular exercise. °· Manage your blood pressure. °· Manage other health problems, such as diabetes. °General instructions °· Take over-the-counter and prescription medicines only as told by your health care provider. Blood thinners may be prescribed after your procedure to improve blood flow through the stent. °· If you need an MRI after your heart stent has been placed, be   sure to tell the health care provider who orders the MRI that you have a heart stent. °· Keep all follow-up visits as directed by your health care provider. This is important. °Contact a health care provider if: °· You have a fever. °· You have chills. °· You have increased bleeding from the catheter insertion area. Hold pressure on the area. °Get help right away if: °· You develop chest pain or shortness of breath. °· You feel faint or you pass out. °· You have unusual pain at the catheter insertion area. °· You have redness,  warmth, or swelling at the catheter insertion area. °· You have drainage (other than a small amount of blood on the dressing) from the catheter insertion area. °· The catheter insertion area is bleeding, and the bleeding does not stop after 30 minutes of holding steady pressure on the area. °· You develop bleeding from any other place, such as from your rectum. There may be bright red blood in your urine or stool, or it may appear as black, tarry stool. °This information is not intended to replace advice given to you by your health care provider. Make sure you discuss any questions you have with your health care provider. °Document Released: 12/20/2004 Document Revised: 02/28/2016 Document Reviewed: 02/28/2016 °Elsevier Interactive Patient Education © 2018 [employer] ° °

## 2017-07-31 NOTE — Care Management Important Message (Signed)
Important Message  Patient Details  Name: Marcus Williams MRN: [id number] Date of Birth: 01/09/1956   Medicare Important Message Given:  Yes    Katrina Stack, RN 07/31/2017, 4:58 PM

## 2017-07-31 NOTE — ED Notes (Signed)
Date and time results received: 07/31/17 1734   Test: Troponin Critical Value: 3.44  Name of Provider Notified: Dr. Burlene Arnt

## 2017-07-31 NOTE — Progress Notes (Signed)
Pt refused bed alarm, but refusing bed alarm. Will continue to monitor.

## 2017-07-31 NOTE — ED Triage Notes (Signed)
Pt presents w/ c/o shortness of breath. Pt discharged today from hospital. Pt s/p cardiac stents x 3 on this past Tuesday. Pt has cough and is audibly wheezing. Pt c/o headache and hip pain.  Pt did not use any meds for his wheezing today.

## 2017-07-31 NOTE — Progress Notes (Signed)
62 year old male with hx of CAD who presented with chest pressure.  Patient acute NSTEMI this admission. Patient has hx of DM II, afib - hx of cardioversion, Sleep Apnea - uses CPAP at night, neuorpathy, CAD, aortic stenosis, moderate; chronic systolic CHF, GERD, HTN, MI, SOB, Sepsis, Stroke, and SVT, CABG, TEE with cardioversion.    Per Dr. Josefa Half' note:  Echo LVEF 35-40%, moderate aortic insufficiency, moderate aortic stenosis, calculated aortic valve area 1.07 cm square.  Cardiac Cath was performed by Dr. Saralyn Pilar.   Procedures - 07/30/2017  CORONARY STENT INTERVENTION  RIGHT/LEFT HEART CATH AND CORONARY/GRAFT ANGIOGRAPHY  Conclusion     Ost Cx to Prox Cx lesion is 100% stenosed.  Ost LAD to Prox LAD lesion is 100% stenosed.  Prox RCA lesion is 70% stenosed.  Mid RCA-1 lesion is 60% stenosed.  Mid RCA-2 lesion is 60% stenosed.  Prox Graft lesion is 99% stenosed.  Mid Graft lesion is 20% stenosed.  A drug-eluting stent was successfully placed using a STENT SIERRA 2.75 X 15 MM.  Post intervention, there is a 0% residual stenosis.  There is moderate aortic valve stenosis.  Hemodynamic findings consistent with mild pulmonary hypertension.   1.  Severe three-vessel coronary artery disease with occluded proximal LAD, subtotaled proximal left circumflex, total distal RCA 2.  Patent LIMA to LAD, patent SVG to D1, patent SVG to OM1, patent SVG to distal RCA with high-grade 99% stenosis in the proximal segment of the saphenous vein graft 3.  Mild reduced left ventricular function, with posterior wall akinesis, with LVEF of 44% 4.  Successful PCI, with DES proximal segment of saphenous vein bypass graft to distal RCA    Rounded on patient.  Discussed with patient about his heart disease, N-STEMI, and stents. Reviewed the location CAD and where stent was placed. Explained to patient about the stent card. Explained the purpose of the stent card. Instructed patient to keep stent  card in his wallet.   Discussed modifiable risk factors including controlling blood pressure, cholesterol, and blood sugar; following heart healthy diet; maintaining healthy weight; exercise; and smoking cessation, if applicable. ?Note: Patient is a never smoker.    Discussed cardiac medications including rationale for taking, mechanisms of action, and side effects. Stressed the importance of taking medications as prescribed.  Discussed emergency plan for heart attack symptoms. Patient verbalized understanding of need to call 911 and not to drive herself to ER if having cardiac symptoms / chest pain.   Heart healthy diet of low sodium, low fat, low cholesterol heart healthy diet discussed.  Patient reported to this RN that he and his wife are going to Diabetic Education Classes.    Smoking Cessation - Patient is a NEVER smoker.?? ? Exercise - Patient does not currently exercise.  Benefits of exercise discussed. ?Explained to patient he had been referred to the Cardiac Rehab program here at [facility] given his NSTEMI and s/p coronary stent.   Patient reported that he participated in Cardiac Rehab at [facility] approximately 11 years ago when he had his CABG.  Patient informed this RN that he will think about participating but he would like for [place] to wait 3 weeks before he calling him, as he wants to have time to get over this MI.  In the meantime, walking was encouraged. Instructed patient to slowly increase and build up to 30 minutes of walking per day for a total of 150 minutes per week.   Discussed with patient about a previous appointment he  had in the [facility] last October 2018.  Marland Kitchen  Patient reported that he missed that appointment due to his wife having procedures at [facility].  Patient also shared that his wife is wheelchair bound and he is her caregiver and it is difficult for him to get her to appointments and for him to go to his appointments at times.  Patient does not want to be followed in  the [facility] at this time.    Patient thanked me for the above information.   ? Roanna Epley, RN, BSN, [facility] Cardiovascular and Pulmonary Nurse Navigator

## 2017-07-31 NOTE — Progress Notes (Signed)
IV and tele removed from patient. Discharge instructions given to patient. Verbalized understanding. No distress at this time. Family to transport patient home.  

## 2017-07-31 NOTE — Progress Notes (Signed)
Pt temp was at 100. Page doctor Pyreddy and ordered to Give PRN tylenol. Will cotninue to monitor.

## 2017-07-31 NOTE — ED Notes (Signed)
Report was called to [facility] on 2A. The RN is accepting. VSS.

## 2017-07-31 NOTE — ED Provider Notes (Signed)
[facility] Emergency Department Provider Note  ____________________________________________   I have reviewed the triage vital signs and the nursing notes. Where available I have reviewed prior notes and, if possible and indicated, outside hospital notes.    HISTORY  Chief Complaint Shortness of Breath    HPI Marcus Williams is a 62 y.o. male who presents today after being discharged this morning.  Patient does have a history of CHF, CAD, reflux disease, cardioversion MI, recently had 2 stents during this last hospitalization, went home with some wheeze.  According to hospitalist, he was encouraged to stay in the hospital but he is adamant that he would go home.  He got home and was coughing and wheezing more.  He denies any chest pain or leg swelling.  EMS found him to be wheezing.  He has not had any vomiting or nausea.  He was able to ambulate prior to discharge which again happened at his insistence according to hospitalist   Past Medical History:  Diagnosis Date  . A-fib ([place])   . Aortic stenosis, moderate 11/2015  . Arthritis   . CAD (coronary artery disease)   . Chronic systolic CHF (congestive heart failure) ([place])   . Diabetes mellitus without complication ([place])    INSULIN DEPENDENT  . GERD (gastroesophageal reflux disease)   . History of cardioversion   . Hypertension   . MI (myocardial infarction) ([place])   . OSA on CPAP   . Sepsis ([place])   . Shortness of breath dyspnea   . Stroke ([place])   . SVT (supraventricular tachycardia) [facility])     Patient Active Problem List   Diagnosis Date Noted  . NSTEMI (non-ST elevated myocardial infarction) ([place]) 07/28/2017  . TIA (transient ischemic attack) 03/01/2017  . Aortic stenosis 11/21/2015  . Acute on chronic diastolic CHF (congestive heart failure), NYHA class 3 ([place]) 11/21/2015  . Aortic valve, bicuspid 09/24/2015  . Cellulitis 05/04/2015  . Acute respiratory failure with hypoxia ([place]) 03/26/2015  .  CHF (congestive heart failure) ([place]) 01/15/2015  . HTN (hypertension) 01/14/2015  . Type II diabetes mellitus ([place]) 01/14/2015  . COPD (chronic obstructive pulmonary disease) ([place]) 01/14/2015  . OSA on CPAP 01/14/2015  . CAD (coronary artery disease) 01/14/2015  . A-fib ([place]) 01/14/2015  . Pain of left calf 01/14/2015  . Acute on chronic systolic CHF (congestive heart failure) ([place]) 01/14/2015  . COPD exacerbation ([place]) 12/22/2014  . Diabetes ([place]) 10/14/2014    Past Surgical History:  Procedure Laterality Date  . 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 BUNIONECTOMY    . CARDIAC CATHETERIZATION N/A 10/02/2015   Procedure: Coronary/Grafts Angiography;  Surgeon: Teodoro Spray, MD;  Location: [place] CV LAB;  Service: Cardiovascular;  Laterality: N/A;  . CARDIAC CATHETERIZATION N/A 11/21/2015   Procedure: Right and Left Heart Cath;  Surgeon: Sherren Mocha, MD;  Location: [place] CV LAB;  Service: Cardiovascular;  Laterality: N/A;  . CORONARY ARTERY BYPASS GRAFT    . CORONARY STENT INTERVENTION N/A 07/30/2017   Procedure: CORONARY STENT INTERVENTION;  Surgeon: Isaias Cowman, MD;  Location: [place] CV LAB;  Service: Cardiovascular;  Laterality: N/A;  . KNEE SURGERY    . quadruple bypass    . RIGHT/LEFT HEART CATH AND CORONARY/GRAFT ANGIOGRAPHY N/A 07/30/2017   Procedure: RIGHT/LEFT HEART CATH AND CORONARY/GRAFT ANGIOGRAPHY;  Surgeon: Isaias Cowman, MD;  Location: [place] CV  LAB;  Service: Cardiovascular;  Laterality: N/A;  . TEE WITHOUT CARDIOVERSION  11/21/2015  . TEE WITHOUT CARDIOVERSION N/A 11/21/2015   Procedure: TRANSESOPHAGEAL ECHOCARDIOGRAM (TEE);  Surgeon: Larey Dresser, MD;  Location: [place];  Service: Cardiovascular;  Laterality: N/A;    Prior to Admission medications   Medication Sig Start Date End Date Taking? Authorizing  Provider  albuterol (PROVENTIL HFA;VENTOLIN HFA) 108 (90 Base) MCG/ACT inhaler Inhale 2 puffs into the lungs every 6 (six) hours as needed for wheezing or shortness of breath. 07/11/15   Flora Lipps, MD  baclofen (LIORESAL) 10 MG tablet Take 1 tablet by mouth 2 (two) times daily. 06/20/17   [provider]  clopidogrel (PLAVIX) 75 MG tablet Take 1 tablet (75 mg total) by mouth daily with breakfast. 08/01/17   Max Sane, MD  cyclobenzaprine (FLEXERIL) 10 MG tablet Take 1 tablet (10 mg total) by mouth 3 (three) times daily as needed. Patient taking differently: Take 10 mg by mouth 2 (two) times daily.  05/29/17   Sable Feil, PA-C  diltiazem (CARDIZEM CD) 300 MG 24 hr capsule Take 1 capsule (300 mg total) by mouth daily. 10/15/14   Idelle Crouch, MD  ELIQUIS 5 MG TABS tablet Take 1 tablet by mouth 2 (two) times daily. 07/20/17   [provider]  gabapentin (NEURONTIN) 300 MG capsule Take 300 mg by mouth 2 (two) times daily.    [provider]  insulin NPH Human (HUMULIN N,NOVOLIN N) 100 UNIT/ML injection Inject 100 Units into the skin 2 (two) times daily.     [provider]  lisinopril (PRINIVIL,ZESTRIL) 20 MG tablet Take 20 mg by mouth daily.    [provider]  meclizine (ANTIVERT) 12.5 MG tablet Take 1 tablet (12.5 mg total) by mouth 3 (three) times daily as needed for dizziness. 03/01/17   Hillary Bow, MD  metFORMIN (GLUCOPHAGE) 1000 MG tablet Take 1,000 mg by mouth 2 (two) times daily with a meal.    [provider]  metoprolol (LOPRESSOR) 100 MG tablet Take 100 mg by mouth 2 (two) times daily. 10/15/15   [provider]  nitroGLYCERIN (NITROSTAT) 0.4 MG SL tablet Place 0.4 mg under the tongue every 5 (five) minutes as needed for chest pain.    [provider]  pantoprazole (PROTONIX) 40 MG tablet Take 40 mg by mouth daily.    [provider]  potassium chloride 20 MEQ TBCR Take 20 mEq by mouth daily. 11/23/15    Shirley Friar, PA-C  pramipexole (MIRAPEX) 0.5 MG tablet Take 1 tablet by mouth 3 (three) times daily. 07/06/17   [provider]  simvastatin (ZOCOR) 40 MG tablet Take 40 mg by mouth at bedtime.     [provider]  torsemide (DEMADEX) 20 MG tablet TAKE 3 TABLETS (60 MG TOTAL) BY MOUTH DAILY. 07/16/16   Shirley Friar, PA-C  traZODone (DESYREL) 100 MG tablet Take 100 mg by mouth at bedtime.    [provider]  umeclidinium-vilanterol (ANORO ELLIPTA) 62.5-25 MCG/INH AEPB Inhale 1 puff into the lungs daily. 10/02/16   Flora Lipps, MD    Allergies Patient has no known allergies.  Family History  Problem Relation Age of Onset  . Stroke Mother   . Heart attack Mother   . Hypertension Mother   . Heart attack Father   . Hypertension Father   . Heart attack Brother        #1  . Diabetes Brother        #  1  . Heart disease Brother        #2  . Lung disease Brother        #2  . Hypertension Brother        #2  . Diabetes Brother        #2    Social History Social History   Tobacco Use  . Smoking status: Never Smoker  . Smokeless tobacco: Never Used  Substance Use Topics  . Alcohol use: No  . Drug use: No    Review of Systems Constitutional: No fever/chills Eyes: No visual changes. ENT: No sore throat. No stiff neck no neck pain Cardiovascular: + chest pain. Respiratory: Denies shortness of breath. Gastrointestinal:   no vomiting.  No diarrhea.  No constipation. Genitourinary: Negative for dysuria. Musculoskeletal: Negative lower extremity swelling Skin: Negative for rash. Neurological: Negative for severe headaches, focal weakness or numbness.   ____________________________________________   PHYSICAL EXAM:  VITAL SIGNS: ED Triage Vitals  Enc Vitals Group     BP 07/31/17 1624 (!) 152/71     Pulse Rate 07/31/17 1624 76     Resp 07/31/17 1624 (!) 24     Temp 07/31/17 1624 98.9 F (37.2 C)     Temp Source 07/31/17 1624  Oral     SpO2 07/31/17 1624 93 %     Weight 07/31/17 1625 285 lb (129.3 kg)     Height 07/31/17 1625 5\' 9"  (1.753 m)     Head Circumference --      Peak Flow --      Pain Score 07/31/17 1625 10     Pain Loc --      Pain Edu? --      Excl. in [place]? --     Constitutional: Alert and oriented. Well appearing and in no acute distress. Eyes: Conjunctivae are normal Head: Atraumatic HEENT: No congestion/rhinnorhea. Mucous membranes are moist.  Oropharynx non-erythematous Neck:   Nontender with no meningismus, no masses, no stridor Cardiovascular: Normal rate, regular rhythm. Grossly normal heart sounds.  Good peripheral circulation. Respiratory: Normal respiratory effort.  No retractions. Lungs use mild wheeze auscultated clearing rapidly with breathing treatment Abdominal: Soft and nontender. No distention. No guarding no rebound Back:  There is no focal tenderness or step off.  there is no midline tenderness there are no lesions noted. there is no CVA tenderness Musculoskeletal: No lower extremity tenderness, no upper extremity tenderness. No joint effusions, no DVT signs strong distal pulses tonic appearing mild bilateral pitting edema Neurologic:  Normal speech and language. No gross focal neurologic deficits are appreciated.  Skin:  Skin is warm, dry and intact. No rash noted. Psychiatric: Mood and affect are normal. Speech and behavior are normal.  ____________________________________________   LABS (all labs ordered are listed, but only abnormal results are displayed)  Labs Reviewed  CBC WITH DIFFERENTIAL/PLATELET - Abnormal; Notable for the following components:      Result Value   RBC 4.39 (*)    HCT 38.9 (*)    RDW 14.6 (*)    Platelets 148 (*)    All other components within normal limits  PROTIME-INR  APTT  TROPONIN I  BASIC METABOLIC PANEL  BRAIN NATRIURETIC PEPTIDE  INFLUENZA PANEL BY PCR (TYPE A & B)    Pertinent labs  results that were available during my care of  the patient were reviewed by me and considered in my medical decision making (see chart for details). ____________________________________________  EKG  I personally interpreted any EKGs  ordered by me or triage EKG shows sinus rhythm at 75 bpm, there is isolated ST elevation in aVR which is consistent with prior, flipped T waves in lead I which are consistent with prior, no acute ischemic changes compared to prior EKG. ____________________________________________  RADIOLOGY  Pertinent labs & imaging results that were available during my care of the patient were reviewed by me and considered in my medical decision making (see chart for details). If possible, patient and/or family made aware of any abnormal findings.  No results found. ____________________________________________    PROCEDURES  Procedure(s) performed: None  Procedures  Critical Care performed: None  ____________________________________________   INITIAL IMPRESSION / ASSESSMENT AND PLAN / ED COURSE  Pertinent labs & imaging results that were available during my care of the patient were reviewed by me and considered in my medical decision making (see chart for details).  And discharge this morning after stents placed in the hospital went home and was acutely short of breath, sounds as if he was short of breath before he went home as well but insisted on departure.  Unclear etiology BNP is pending, patient does not require intubation at this time.  Troponin is pending, could be a cardiogenic wheeze I suppose, we will check a BNP chest x-ray, after discussion with the hospitalist service, we are giving the patient Solu-Medrol for his wheeze, his site looks good from his cath.  He has no chest pain.  Gust with cardiology, Dr. coloscopy, agrees patient likely will require readmission.    ____________________________________________   FINAL CLINICAL IMPRESSION(S) / ED DIAGNOSES  Final diagnoses:  None      This  chart was dictated using voice recognition software.  Despite best efforts to proofread,  errors can occur which can change meaning.      Schuyler Amor, MD 07/31/17 [phone]

## 2017-07-31 NOTE — H&P (Signed)
[facility] NAME: Marcus Williams    MR#:  [id number]  DATE OF BIRTH:  1956-01-05  DATE OF ADMISSION:  07/31/2017  PRIMARY CARE PHYSICIAN: Idelle Crouch, MD   REQUESTING/REFERRING PHYSICIAN: Dr. Charlotte Crumb  CHIEF COMPLAINT:   Chief Complaint  Patient presents with  . Shortness of Breath    HISTORY OF PRESENT ILLNESS:  Marcus Williams  is a 62 y.o. male with a known history of atrial fibrillation, aortic stenosis, coronary artery disease status post bypass, chronic systolic CHF, diabetes, GERD, morbid obesity, obstructive sleep apnea, history of previous CVA, history of previous SVT who was just discharged from the hospital today after being admitted for a non-ST elevation MI and status post PCI and drug-eluting stent to one of his bypass grafts and now returns to the hospital due to wheezing and shortness of breath. Patient says that he got home and started to develop significant wheezing and shortness of breath and therefore came right back to the ER for further evaluation. In the ER patient underwent a chest x-ray which showed no acute changes without other than chronic pulmonary vascular congestion, he was given some Solu-Medrol, breathing treatments and continued to have wheezing and shortness of breath and therefore hospitalist services were contacted further treatment and evaluation. Has any chest pains, nausea, vomiting, abdominal pain, fever, chills. He admits to a cough which is nonproductive and some wheezing.  PAST MEDICAL HISTORY:   Past Medical History:  Diagnosis Date  . A-fib ([place])   . Aortic stenosis, moderate 11/2015  . Arthritis   . CAD (coronary artery disease)   . Chronic systolic CHF (congestive heart failure) ([place])   . Diabetes mellitus without complication ([place])    INSULIN DEPENDENT  . GERD (gastroesophageal reflux disease)   . History of cardioversion   . Hypertension   . MI (myocardial infarction) ([place])   .  OSA on CPAP   . Sepsis ([place])   . Shortness of breath dyspnea   . Stroke ([place])   . SVT (supraventricular tachycardia) ([place])     PAST SURGICAL HISTORY:   Past Surgical History:  Procedure Laterality Date  . 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 BUNIONECTOMY    . CARDIAC CATHETERIZATION N/A 10/02/2015   Procedure: Coronary/Grafts Angiography;  Surgeon: Teodoro Spray, MD;  Location: [place] CV LAB;  Service: Cardiovascular;  Laterality: N/A;  . CARDIAC CATHETERIZATION N/A 11/21/2015   Procedure: Right and Left Heart Cath;  Surgeon: Sherren Mocha, MD;  Location: [place] CV LAB;  Service: Cardiovascular;  Laterality: N/A;  . CORONARY ARTERY BYPASS GRAFT    . CORONARY STENT INTERVENTION N/A 07/30/2017   Procedure: CORONARY STENT INTERVENTION;  Surgeon: Isaias Cowman, MD;  Location: [place] CV LAB;  Service: Cardiovascular;  Laterality: N/A;  . KNEE SURGERY    . quadruple bypass    . RIGHT/LEFT HEART CATH AND CORONARY/GRAFT ANGIOGRAPHY N/A 07/30/2017   Procedure: RIGHT/LEFT HEART CATH AND CORONARY/GRAFT ANGIOGRAPHY;  Surgeon: Isaias Cowman, MD;  Location: [place] CV LAB;  Service: Cardiovascular;  Laterality: N/A;  . TEE WITHOUT CARDIOVERSION  11/21/2015  . TEE WITHOUT CARDIOVERSION N/A 11/21/2015   Procedure: TRANSESOPHAGEAL ECHOCARDIOGRAM (TEE);  Surgeon: Larey Dresser, MD;  Location: [facility] ENDOSCOPY;  Service: Cardiovascular;  Laterality: N/A;    SOCIAL HISTORY:   Social History   Tobacco Use  .  Smoking status: Never Smoker  . Smokeless tobacco: Never Used  Substance Use Topics  . Alcohol use: No    FAMILY HISTORY:   Family History  Problem Relation Age of Onset  . Stroke Mother   . Heart attack Mother   . Hypertension Mother   . Heart attack Father   . Hypertension Father   . Heart attack Brother        #1  . Diabetes Brother         #1  . Heart disease Brother        #2  . Lung disease Brother        #2  . Hypertension Brother        #2  . Diabetes Brother        #2    DRUG ALLERGIES:  No Known Allergies  REVIEW OF SYSTEMS:   Review of Systems  Constitutional: Negative for fever and weight loss.  HENT: Negative for congestion, nosebleeds and tinnitus.   Eyes: Negative for blurred vision, double vision and redness.  Respiratory: Positive for cough, shortness of breath and wheezing. Negative for hemoptysis.   Cardiovascular: Negative for chest pain, orthopnea, leg swelling and PND.  Gastrointestinal: Negative for abdominal pain, diarrhea, melena, nausea and vomiting.  Genitourinary: Negative for dysuria, hematuria and urgency.  Musculoskeletal: Negative for falls and joint pain.  Neurological: Negative for dizziness, tingling, sensory change, focal weakness, seizures, weakness and headaches.  Endo/Heme/Allergies: Negative for polydipsia. Does not bruise/bleed easily.  Psychiatric/Behavioral: Negative for depression and memory loss. The patient is not nervous/anxious.     MEDICATIONS AT HOME:   Prior to Admission medications   Medication Sig Start Date End Date Taking? Authorizing Provider  albuterol (PROVENTIL HFA;VENTOLIN HFA) 108 (90 Base) MCG/ACT inhaler Inhale 2 puffs into the lungs every 6 (six) hours as needed for wheezing or shortness of breath. 07/11/15  Yes Kasa, Maretta Bees, MD  apixaban (ELIQUIS) 5 MG TABS tablet Take 5 mg by mouth 2 (two) times daily.   Yes [provider]  baclofen (LIORESAL) 10 MG tablet Take 10 mg by mouth 2 (two) times daily.    Yes [provider]  cyclobenzaprine (FLEXERIL) 10 MG tablet Take 1 tablet (10 mg total) by mouth 3 (three) times daily as needed. Patient taking differently: Take 10 mg by mouth 2 (two) times daily.  05/29/17  Yes Sable Feil, PA-C  diltiazem (CARDIZEM CD) 300 MG 24 hr capsule Take 1 capsule (300 mg total) by mouth daily.  10/15/14  Yes Idelle Crouch, MD  gabapentin (NEURONTIN) 300 MG capsule Take 300 mg by mouth 2 (two) times daily.   Yes [provider]  insulin NPH Human (HUMULIN N,NOVOLIN N) 100 UNIT/ML injection Inject 100 Units into the skin 2 (two) times daily.    Yes [provider]  lisinopril (PRINIVIL,ZESTRIL) 20 MG tablet Take 20 mg by mouth daily.   Yes [provider]  meclizine (ANTIVERT) 12.5 MG tablet Take 1 tablet (12.5 mg total) by mouth 3 (three) times daily as needed for dizziness. 03/01/17  Yes Sudini, Alveta Heimlich, MD  metFORMIN (GLUCOPHAGE) 1000 MG tablet Take 1,000 mg by mouth 2 (two) times daily with a meal.   Yes [provider]  metoprolol (LOPRESSOR) 100 MG tablet Take 100 mg by mouth 2 (two) times daily. 10/15/15  Yes [provider]  nitroGLYCERIN (NITROSTAT) 0.4 MG SL tablet Place 0.4 mg under the tongue every 5 (five) minutes as needed for chest pain.  Yes [provider]  pantoprazole (PROTONIX) 40 MG tablet Take 40 mg by mouth daily.   Yes [provider]  potassium chloride 20 MEQ TBCR Take 20 mEq by mouth daily. 11/23/15  Yes Shirley Friar, PA-C  pramipexole (MIRAPEX) 0.5 MG tablet Take 0.5 mg by mouth 3 (three) times daily.    Yes [provider]  simvastatin (ZOCOR) 40 MG tablet Take 40 mg by mouth at bedtime.    Yes [provider]  torsemide (DEMADEX) 20 MG tablet TAKE 3 TABLETS (60 MG TOTAL) BY MOUTH DAILY. 07/16/16  Yes Shirley Friar, PA-C  traZODone (DESYREL) 100 MG tablet Take 100 mg by mouth at bedtime.   Yes [provider]  umeclidinium-vilanterol (ANORO ELLIPTA) 62.5-25 MCG/INH AEPB Inhale 1 puff into the lungs daily. 10/02/16  Yes Flora Lipps, MD  clopidogrel (PLAVIX) 75 MG tablet Take 1 tablet (75 mg total) by mouth daily with breakfast. Patient not taking: Reported on 07/31/2017 08/01/17   Max Sane, MD      VITAL SIGNS:  Blood pressure (!) 152/71, pulse 76,  temperature 98.9 F (37.2 C), temperature source Oral, resp. rate (!) 24, height 5\' 9"  (1.753 m), weight 129.3 kg (285 lb), SpO2 100 %.  PHYSICAL EXAMINATION:  Physical Exam  GENERAL:  62 y.o.-year-old obese patient lying in the bed in mild Resp. Distress.   EYES: Pupils equal, round, reactive to light and accommodation. No scleral icterus. Extraocular muscles intact.  HEENT: Head atraumatic, normocephalic. Oropharynx and nasopharynx clear. No oropharyngeal erythema, moist oral mucosa  NECK:  Supple, no jugular venous distention. No thyroid enlargement, no tenderness.  LUNGS: Good A/e b/l, minimal end expiratory wheezing bilaterally, no rales, rhonchi. No use of accessory muscles of respiration.  CARDIOVASCULAR: S1, S2 RRR. No murmurs, rubs, gallops, clicks.  ABDOMEN: Soft, nontender, nondistended. Bowel sounds present. No organomegaly or mass.  EXTREMITIES: +1-2 edema b/l, No cyanosis, or clubbing. + 2 pedal & radial pulses b/l.   NEUROLOGIC: Cranial nerves II through XII are intact. No focal Motor or sensory deficits appreciated b/l. Globally weak.  PSYCHIATRIC: The patient is alert and oriented x 3. Good affect.  SKIN: No obvious rash, lesion, or ulcer.   LABORATORY PANEL:   CBC Recent Labs  Lab 07/31/17 1639  WBC 8.2  HGB 13.2  HCT 38.9*  PLT 148*   ------------------------------------------------------------------------------------------------------------------  Chemistries  Recent Labs  Lab 07/31/17 1639  NA 137  K 4.2  CL 105  CO2 22  GLUCOSE 212*  BUN 21*  CREATININE 1.10  CALCIUM 8.7*   ------------------------------------------------------------------------------------------------------------------  Cardiac Enzymes Recent Labs  Lab 07/31/17 1639  TROPONINI 3.44*   ------------------------------------------------------------------------------------------------------------------  RADIOLOGY:  Dg Chest 2 View  Result Date: 07/31/2017 CLINICAL DATA:   Shortness of breath EXAM: CHEST  2 VIEW COMPARISON:  07/28/2017 FINDINGS: The cardiopericardial silhouette is within normal limits for size. The patient is status post CABG. There is pulmonary vascular congestion without overt pulmonary edema. Atelectasis bilaterally is similar to prior. The visualized bony structures of the thorax are intact. IMPRESSION: Similar to prior study with vascular congestion but no overt pulmonary edema. Atelectasis bilaterally. Electronically Signed   By: Misty Stanley M.D.   On: 07/31/2017 17:34     IMPRESSION AND PLAN:   62 year old male with past medical history of obesity, obstructive sleep apnea, history of coronary artery disease status post bypass, chronic systolic CHF, aortic stenosis, chronic atrial fibrillation, GERD, recent admission for a non-ST elevation MI status post cardiac catheterization and  drug-eluting stent to one of the bypass grafts and just discharged earlier today now returns back for shortness of breath, wheezing.  1. Acute respiratory failure with hypoxia-I suspect this is secondary to mild COPD exacerbation. -Continue O2 supplementation, we'll treat the patient with some IV steroids, scheduled DuoNeb's, Pulmicort nebs. -Assess the patient for home oxygen prior to discharge tomorrow.  2. COPD exacerbation-this is the cause of patient's worsening respiratory failure and wheezing. -patient's rapid flu/PCR is negative. Patient with IV steroids, scheduled DuoNeb's, Pulmicort nebs. -Assess the patient for home oxygen prior to discharge.  3. Chronic systolic CHF-clinically patient was not in congestive heart failure. -Continue torsemide, metoprolol, lisinopril.  4. History of chronic atrial fibrillation-rate controlled. Continue Cardizem, metoprolol. Continue Eliquis.  5. History of coronary artery disease status post bypass and recent stent to the saphenous vein bypass graft to the distal RCA-patient currently not having any acute chest  pain. -Continue Plavix, Eliquis, metoprolol, statin  6. Diabetes type 2 without complication-continue NPH, metformin. Follow blood sugars.  7. Diabetic neuropathy-continue gabapentin.  8. GERD-continue Protonix.  9. Restless leg syndrome-continue Mirapex.    All the records are reviewed and case discussed with ED provider. Management plans discussed with the patient, family and they are in agreement.  CODE STATUS: Full code  TOTAL TIME TAKING CARE OF THIS PATIENT: 45 minutes.    Henreitta Leber M.D on 07/31/2017 at 7:19 PM  Between 7am to 6pm - Pager - [phone]  After 6pm go to [email or web address] - password [email or web address] [facility]  Office  [phone]  CC: Primary care physician; Idelle Crouch, MD

## 2017-07-31 NOTE — Progress Notes (Signed)
[facility] Cardiology  SUBJECTIVE: Patient sitting in chair, denies chest pain or shortness of breath   Vitals:   07/31/17 0000 07/31/17 0540 07/31/17 0705 07/31/17 0754  BP: (!) 155/70 122/66    Pulse:  66    Resp:  18    Temp:  100 F (37.8 C) 98.7 F (37.1 C)   TempSrc:  Oral Oral   SpO2:  90%  98%  Weight:      Height:         Intake/Output Summary (Last 24 hours) at 07/31/2017 9449 Last data filed at 07/31/2017 0600 Gross per 24 hour  Intake 1094.8 ml  Output 1000 ml  Net 94.8 ml      PHYSICAL EXAM  General: Well developed, well nourished, in no acute distress HEENT:  Normocephalic and atramatic Neck:  No JVD.  Lungs: Clear bilaterally to auscultation and percussion. Heart: HRRR . Normal S1 and S2 without gallops or murmurs.  Abdomen: Bowel sounds are positive, abdomen soft and non-tender  Msk:  Back normal, normal gait. Normal strength and tone for age. Extremities: No clubbing, cyanosis or edema.   Neuro: Alert and oriented X 3. Psych:  Good affect, responds appropriately   LABS: Basic Metabolic Panel: Recent Labs    07/29/17 0753 07/30/17 0015  NA 140 140  K 3.9 3.7  CL 110 106  CO2 23 25  GLUCOSE 166* 167*  BUN 14 19  CREATININE 0.78 1.07  CALCIUM 8.5* 8.3*   Liver Function Tests: No results for input(s): AST, ALT, ALKPHOS, BILITOT, PROT, ALBUMIN in the last 72 hours. No results for input(s): LIPASE, AMYLASE in the last 72 hours. CBC: Recent Labs    07/29/17 0753 07/30/17 0015  WBC 6.8 7.9  HGB 13.3 12.8*  HCT 40.5 38.2*  MCV 89.8 88.6  PLT 133* 153   Cardiac Enzymes: Recent Labs    07/28/17 1918 07/29/17 0200 07/29/17 0753  TROPONINI 0.23* 8.37* 15.24*   BNP: Invalid input(s): POCBNP D-Dimer: No results for input(s): DDIMER in the last 72 hours. Hemoglobin A1C: No results for input(s): HGBA1C in the last 72 hours. Fasting Lipid Panel: No results for input(s): CHOL, HDL, LDLCALC, TRIG, CHOLHDL, LDLDIRECT in the last 72  hours. Thyroid Function Tests: Recent Labs    07/29/17 0200  TSH 1.899   Anemia Panel: No results for input(s): VITAMINB12, FOLATE, FERRITIN, TIBC, IRON, RETICCTPCT in the last 72 hours.  No results found.   Echo LVEF 35-40%, moderate aortic insufficiency, moderate aortic stenosis, calculated aortic valve area 1.07 cm square  TELEMETRY: Sinus rhythm:  ASSESSMENT AND PLAN:  Active Problems:   NSTEMI (non-ST elevated myocardial infarction) ([place])    1.  Non-ST elevation myocardial infarction, DES SVG to distal RCA, chest pain-free 2.  Paroxysmal atrial fibrillation, Eliquis on hold 3.  Moderate aortic stenosis by 2D echocardiogram and cardiac catheterization  Recommendations  1.  Agree with current therapy 2.  Continue dual antiplatelet therapy 3.  Ambulate after completion of Aggrastat infusion, discharge home later today 4.  Follow-up with Dr. Ubaldo Glassing in 1 week, resume Eliquis as outpatient   Isaias Cowman, MD, PhD, [facility] 07/31/2017 8:08 AM

## 2017-08-01 DIAGNOSIS — J9601 Acute respiratory failure with hypoxia: Secondary | ICD-10-CM | POA: Diagnosis not present

## 2017-08-01 DIAGNOSIS — J441 Chronic obstructive pulmonary disease with (acute) exacerbation: Secondary | ICD-10-CM | POA: Diagnosis not present

## 2017-08-01 DIAGNOSIS — I482 Chronic atrial fibrillation: Secondary | ICD-10-CM | POA: Diagnosis not present

## 2017-08-01 DIAGNOSIS — G4733 Obstructive sleep apnea (adult) (pediatric): Secondary | ICD-10-CM | POA: Diagnosis not present

## 2017-08-01 LAB — BASIC METABOLIC PANEL
Anion gap: 12 (ref 5–15)
BUN: 27 mg/dL — ABNORMAL HIGH (ref 6–20)
CALCIUM: 8.6 mg/dL — AB (ref 8.9–10.3)
CO2: 19 mmol/L — AB (ref 22–32)
CREATININE: 1.13 mg/dL (ref 0.61–1.24)
Chloride: 106 mmol/L (ref 101–111)
Glucose, Bld: 401 mg/dL — ABNORMAL HIGH (ref 65–99)
Potassium: 4.5 mmol/L (ref 3.5–5.1)
SODIUM: 137 mmol/L (ref 135–145)

## 2017-08-01 LAB — CBC
HCT: 36.8 % — ABNORMAL LOW (ref 40.0–52.0)
HEMOGLOBIN: 12.5 g/dL — AB (ref 13.0–18.0)
MCH: 30.2 pg (ref 26.0–34.0)
MCHC: 33.9 g/dL (ref 32.0–36.0)
MCV: 89 fL (ref 80.0–100.0)
PLATELETS: 131 10*3/uL — AB (ref 150–440)
RBC: 4.13 MIL/uL — ABNORMAL LOW (ref 4.40–5.90)
RDW: 14.5 % (ref 11.5–14.5)
WBC: 7.6 10*3/uL (ref 3.8–10.6)

## 2017-08-01 LAB — GLUCOSE, CAPILLARY
GLUCOSE-CAPILLARY: 378 mg/dL — AB (ref 65–99)
GLUCOSE-CAPILLARY: 416 mg/dL — AB (ref 65–99)
GLUCOSE-CAPILLARY: 381 mg/dL — AB (ref 65–99)
Glucose-Capillary: 415 mg/dL — ABNORMAL HIGH (ref 65–99)

## 2017-08-01 MED ORDER — INSULIN ASPART 100 UNIT/ML ~~LOC~~ SOLN: 0.0000 [IU] | Freq: Three times a day (TID) | SUBCUTANEOUS | Status: DC

## 2017-08-01 MED ORDER — HYDROCOD POLST-CPM POLST ER 10-8 MG/5ML PO SUER
5.0000 mL | Freq: Two times a day (BID) | ORAL | Status: DC
  Administered 2017-08-01 – 2017-08-02 (×3): 5 mL via ORAL
  Filled 2017-08-01 (×3): qty 5

## 2017-08-01 MED ORDER — INSULIN ASPART 100 UNIT/ML ~~LOC~~ SOLN
22.0000 [IU] | Freq: Once | SUBCUTANEOUS | Status: AC
  Administered 2017-08-01: 22 [IU] via SUBCUTANEOUS
  Filled 2017-08-01: qty 1

## 2017-08-01 MED ORDER — INSULIN ASPART 100 UNIT/ML ~~LOC~~ SOLN
0.0000 [IU] | Freq: Three times a day (TID) | SUBCUTANEOUS | Status: DC
  Administered 2017-08-01 – 2017-08-02 (×2): 20 [IU] via SUBCUTANEOUS
  Filled 2017-08-01 (×2): qty 1

## 2017-08-01 NOTE — Progress Notes (Signed)
Pts CBG is 415. MD notified. Orders to give 22 units of novolog. I will continue to assess.

## 2017-08-01 NOTE — Progress Notes (Signed)
[place] at [place] NAME: Marcus Williams    MR#:  [id number]  DATE OF BIRTH:  [birth year]  SUBJECTIVE: Seen at bedside, admitted for COPD exacerbation, he feels little better today blood sugar is more than 400.  CHIEF COMPLAINT:   Chief Complaint  Patient presents with  . Shortness of Breath    REVIEW OF SYSTEMS:   ROS CONSTITUTIONAL: No fever, fatigue or weakness.  EYES: No blurred or double vision.  EARS, NOSE, AND THROAT: No tinnitus or ear pain.  RESPIRATORY: No cough, shortness of breath, wheezing or hemoptysis.  CARDIOVASCULAR: No chest pain, orthopnea, edema.  GASTROINTESTINAL: No nausea, vomiting, diarrhea or abdominal pain.  GENITOURINARY: No dysuria, hematuria.  ENDOCRINE: No polyuria, nocturia,  HEMATOLOGY: No anemia, easy bruising or bleeding SKIN: No rash or lesion. MUSCULOSKELETAL: No joint pain or arthritis.   NEUROLOGIC: No tingling, numbness, weakness.  PSYCHIATRY: No anxiety or depression.   DRUG ALLERGIES:  No Known Allergies  VITALS:  Blood pressure (!) 148/66, pulse 70, temperature 97.6 F (36.4 C), temperature source Oral, resp. rate 18, height 5\' 9"  (1.753 m), weight 128.8 kg (284 lb), SpO2 95 %.  PHYSICAL EXAMINATION:  GENERAL:  62 y.o.-year-old patient lying in the bed with no acute distress.  EYES: Pupils equal, round, reactive to light and accommodation. No scleral icterus. Extraocular muscles intact.  HEENT: Head atraumatic, normocephalic. Oropharynx and nasopharynx clear.  NECK:  Supple, no jugular venous distention. No thyroid enlargement, no tenderness.  LUNGS:  faint expiratory wheeze bilaterally.Marland Kitchen  CARDIOVASCULAR: S1, S2 normal. No murmurs, rubs, or gallops.  ABDOMEN: Soft, nontender, nondistended. Bowel sounds present. No organomegaly or mass.  EXTREMITIES: No pedal edema, cyanosis, or clubbing.  NEUROLOGIC: Cranial nerves II through XII are intact. Muscle strength 5/5 in all extremities.  Sensation intact. Gait not checked.  PSYCHIATRIC: The patient is alert and oriented x 3.  SKIN: No obvious rash, lesion, or ulcer.    LABORATORY PANEL:   CBC Recent Labs  Lab 08/01/17 0541  WBC 7.6  HGB 12.5*  HCT 36.8*  PLT 131*   ------------------------------------------------------------------------------------------------------------------  Chemistries  Recent Labs  Lab 08/01/17 0541  NA 137  K 4.5  CL 106  CO2 19*  GLUCOSE 401*  BUN 27*  CREATININE 1.13  CALCIUM 8.6*   ------------------------------------------------------------------------------------------------------------------  Cardiac Enzymes Recent Labs  Lab 07/31/17 1639  TROPONINI 3.44*   ------------------------------------------------------------------------------------------------------------------  RADIOLOGY:  Dg Chest 2 View  Result Date: 07/31/2017 CLINICAL DATA:  Shortness of breath EXAM: CHEST  2 VIEW COMPARISON:  07/28/2017 FINDINGS: The cardiopericardial silhouette is within normal limits for size. The patient is status post CABG. There is pulmonary vascular congestion without overt pulmonary edema. Atelectasis bilaterally is similar to prior. The visualized bony structures of the thorax are intact. IMPRESSION: Similar to prior study with vascular congestion but no overt pulmonary edema. Atelectasis bilaterally. Electronically Signed   By: Misty Stanley M.D.   On: 07/31/2017 17:34    EKG:   Orders placed or performed during the hospital encounter of 07/31/17  . ED EKG  . ED EKG  . EKG 12-Lead  . EKG 12-Lead    ASSESSMENT AND PLAN:  #1 COPD exacerbation: Clinically improving, likely discharge tomorrow home continue IV steroids, bronchodilators, patient is having cough, asking for some Tussionex. 2.  Diabetes mellitus type 2: Uncontrolled secondary to steroids, use higher sliding scale coverage for obesity and steroid use.  Continue Lantus, metformin. 3.  Atrial fibrillation, chronic  systolic  heart failure, CAD: Cardizem CD 300 mg daily, apixaban 5 mg p.o. twice daily, metoprolol 100 mg p.o. twice daily, simvastatin 40 mg p.o. daily, torsemide 20 mg p.o. daily, KCl 20  Meq p.o. daily, Plavix 75 mg p.o. daily    All the records are reviewed and case discussed with Care Management/Social Workerr. Management plans discussed with the patient, family and they are in agreement.  CODE STATUS: full  TOTAL TIME TAKING CARE OF THIS PATIENT: 35 minutes.   POSSIBLE D/C IN 1-2DAYS, DEPENDING ON CLINICAL CONDITION. Marcus Williams M.D on 08/01/2017 at 2:13 PM  Between 7am to 6pm - Pager - [phone]  After 6pm go to [email or web address] - password [email or web address] [facility]  Office  [phone]  CC: Primary care physician; Idelle Crouch, MD   Note: This dictation was prepared with Dragon dictation along with smaller phrase technology. Any transcriptional errors that result from this process are unintentional.

## 2017-08-01 NOTE — Progress Notes (Signed)
Pts CBG is 416. MD notified. Orders to SSI at 20 units. I will continue to assess.

## 2017-08-02 DIAGNOSIS — J9601 Acute respiratory failure with hypoxia: Secondary | ICD-10-CM | POA: Diagnosis not present

## 2017-08-02 DIAGNOSIS — J441 Chronic obstructive pulmonary disease with (acute) exacerbation: Secondary | ICD-10-CM | POA: Diagnosis not present

## 2017-08-02 DIAGNOSIS — I482 Chronic atrial fibrillation: Secondary | ICD-10-CM | POA: Diagnosis not present

## 2017-08-02 DIAGNOSIS — G4733 Obstructive sleep apnea (adult) (pediatric): Secondary | ICD-10-CM | POA: Diagnosis not present

## 2017-08-02 LAB — GLUCOSE, CAPILLARY
GLUCOSE-CAPILLARY: 353 mg/dL — AB (ref 65–99)
Glucose-Capillary: 369 mg/dL — ABNORMAL HIGH (ref 65–99)

## 2017-08-02 MED ORDER — IPRATROPIUM-ALBUTEROL 0.5-2.5 (3) MG/3ML IN SOLN: 3.0000 mL | Freq: Four times a day (QID) | RESPIRATORY_TRACT | 0 refills | Status: DC

## 2017-08-02 MED ORDER — PREDNISONE 10 MG (21) PO TBPK: ORAL_TABLET | ORAL | 0 refills | Status: DC

## 2017-08-02 MED ORDER — HYDROCOD POLST-CPM POLST ER 10-8 MG/5ML PO SUER: 5.0000 mL | Freq: Two times a day (BID) | ORAL | 0 refills | Status: DC

## 2017-08-02 NOTE — Discharge Summary (Signed)
[name] at [place] NAME: Marcus Williams    MR#:  [id number]  DATE OF BIRTH:  Oct 24, 1955  DATE OF ADMISSION:  07/28/2017   ADMITTING PHYSICIAN: Dustin Flock, MD  DATE OF DISCHARGE: 07/31/2017 11:22 AM  PRIMARY CARE PHYSICIAN: Idelle Crouch, MD   ADMISSION DIAGNOSIS:  Bad headache [R51] NSTEMI (non-ST elevated myocardial infarction) ([place]) [I21.4] DISCHARGE DIAGNOSIS:  Active Problems:   NSTEMI (non-ST elevated myocardial infarction) ([place])  SECONDARY DIAGNOSIS:   Past Medical History:  Diagnosis Date  . A-fib ([place])   . Aortic stenosis, moderate 11/2015  . Arthritis   . CAD (coronary artery disease)   . Chronic systolic CHF (congestive heart failure) ([place])   . Diabetes mellitus without complication ([place])    INSULIN DEPENDENT  . GERD (gastroesophageal reflux disease)   . History of cardioversion   . Hypertension   . MI (myocardial infarction) ([place])   . OSA on CPAP   . Sepsis ([place])   . Shortness of breath dyspnea   . Stroke ([place])   . SVT (supraventricular tachycardia) [facility])    HOSPITAL COURSE:  Patient is a 62 year old white male with history of coronary artery disease presenting with chest pressure  1.Acute  Non-ST elevation myocardial infarction, DES SVG to distal RCA, chest pain-free - Continue dual antiplatelet therapy - Ambulated well after completion of Aggrastat infusion, discharge home later today per Cardio. Patient was also very anxious to leave (had mild wheezing but he didn't bother and wanted to leave) - was recommended to Follow-up with Dr. Ubaldo Glassing in 1 week, resume Eliquis as outpatient  2.Diabetes type 2 - continue his home insulin, hold metformin for 1 more day  3.History of atrial fibrillation continue Cardizem. Resume Eliquis at D/C  4.Sleep apnea use CPAP at nighttime  5.Neuropathy continue gabapentin  6.hypertension continue lisinopril  DISCHARGE CONDITIONS:  stable CONSULTS  OBTAINED:  Treatment Team:  Isaias Cowman, MD DRUG ALLERGIES:  No Known Allergies DISCHARGE MEDICATIONS:   Allergies as of 07/31/2017   No Known Allergies     Medication List    STOP taking these medications   ketorolac 10 MG tablet Commonly known as:  TORADOL   lidocaine 5 % Commonly known as:  LIDODERM   predniSONE 10 MG (21) Tbpk tablet Commonly known as:  STERAPRED UNI-PAK 21 TAB   traMADol 50 MG tablet Commonly known as:  ULTRAM     TAKE these medications   albuterol 108 (90 Base) MCG/ACT inhaler Commonly known as:  PROVENTIL HFA;VENTOLIN HFA Inhale 2 puffs into the lungs every 6 (six) hours as needed for wheezing or shortness of breath.   baclofen 10 MG tablet Commonly known as:  LIORESAL Take 10 mg by mouth 2 (two) times daily.   clopidogrel 75 MG tablet Commonly known as:  PLAVIX Take 1 tablet (75 mg total) by mouth daily with breakfast.   cyclobenzaprine 10 MG tablet Commonly known as:  FLEXERIL Take 1 tablet (10 mg total) by mouth 3 (three) times daily as needed. What changed:  when to take this   diltiazem 300 MG 24 hr capsule Commonly known as:  CARDIZEM CD Take 1 capsule (300 mg total) by mouth daily.   gabapentin 300 MG capsule Commonly known as:  NEURONTIN Take 300 mg by mouth 2 (two) times daily.   insulin NPH Human 100 UNIT/ML injection Commonly known as:  HUMULIN N,NOVOLIN N Inject 100 Units into the skin 2 (two) times daily.   lisinopril  20 MG tablet Commonly known as:  PRINIVIL,ZESTRIL Take 20 mg by mouth daily.   meclizine 12.5 MG tablet Commonly known as:  ANTIVERT Take 1 tablet (12.5 mg total) by mouth 3 (three) times daily as needed for dizziness.   metFORMIN 1000 MG tablet Commonly known as:  GLUCOPHAGE Take 1,000 mg by mouth 2 (two) times daily with a meal.   metoprolol tartrate 100 MG tablet Commonly known as:  LOPRESSOR Take 100 mg by mouth 2 (two) times daily.   nitroGLYCERIN 0.4 MG SL tablet Commonly known  as:  NITROSTAT Place 0.4 mg under the tongue every 5 (five) minutes as needed for chest pain.   pantoprazole 40 MG tablet Commonly known as:  PROTONIX Take 40 mg by mouth daily.   Potassium Chloride ER 20 MEQ Tbcr Take 20 mEq by mouth daily.   pramipexole 0.5 MG tablet Commonly known as:  MIRAPEX Take 0.5 mg by mouth 3 (three) times daily.   simvastatin 40 MG tablet Commonly known as:  ZOCOR Take 40 mg by mouth at bedtime.   torsemide 20 MG tablet Commonly known as:  DEMADEX TAKE 3 TABLETS (60 MG TOTAL) BY MOUTH DAILY.   traZODone 100 MG tablet Commonly known as:  DESYREL Take 100 mg by mouth at bedtime.   umeclidinium-vilanterol 62.5-25 MCG/INH Aepb Commonly known as:  ANORO ELLIPTA Inhale 1 puff into the lungs daily.        DISCHARGE INSTRUCTIONS:   DIET:  Cardiac diet DISCHARGE CONDITION:  Stable ACTIVITY:  Activity as tolerated OXYGEN:  Home Oxygen: No.  Oxygen Delivery: room air DISCHARGE LOCATION:  home   If you experience worsening of your admission symptoms, develop shortness of breath, life threatening emergency, suicidal or homicidal thoughts you must seek medical attention immediately by calling 911 or calling your MD immediately  if symptoms less severe.  You Must read complete instructions/literature along with all the possible adverse reactions/side effects for all the Medicines you take and that have been prescribed to you. Take any new Medicines after you have completely understood and accpet all the possible adverse reactions/side effects.   Please note  You were cared for by a hospitalist during your hospital stay. If you have any questions about your discharge medications or the care you received while you were in the hospital after you are discharged, you can call the unit and asked to speak with the hospitalist on call if the hospitalist that took care of you is not available. Once you are discharged, your primary care physician will handle  any further medical issues. Please note that NO REFILLS for any discharge medications will be authorized once you are discharged, as it is imperative that you return to your primary care physician (or establish a relationship with a primary care physician if you do not have one) for your aftercare needs so that they can reassess your need for medications and monitor your lab values.    On the day of Discharge:  VITAL SIGNS:  Blood pressure (!) 142/96, pulse 65, temperature 97.8 F (36.6 C), temperature source Oral, resp. rate 18, height 5\' 9"  (1.753 m), weight 129.4 kg (285 lb 3.2 oz), SpO2 96 %. PHYSICAL EXAMINATION:  GENERAL:  62 y.o.-year-old patient lying in the bed with no acute distress.  EYES: Pupils equal, round, reactive to light and accommodation. No scleral icterus. Extraocular muscles intact.  HEENT: Head atraumatic, normocephalic. Oropharynx and nasopharynx clear.  NECK:  Supple, no jugular venous distention. No thyroid enlargement, no tenderness.  LUNGS: Normal breath sounds bilaterally, no wheezing, rales,rhonchi or crepitation. No use of accessory muscles of respiration.  CARDIOVASCULAR: S1, S2 normal. No murmurs, rubs, or gallops.  ABDOMEN: Soft, non-tender, non-distended. Bowel sounds present. No organomegaly or mass.  EXTREMITIES: No pedal edema, cyanosis, or clubbing.  NEUROLOGIC: Cranial nerves II through XII are intact. Muscle strength 5/5 in all extremities. Sensation intact. Gait not checked.  PSYCHIATRIC: The patient is alert and oriented x 3.  SKIN: No obvious rash, lesion, or ulcer.  DATA REVIEW:   CBC Recent Labs  Lab 08/01/17 0541  WBC 7.6  HGB 12.5*  HCT 36.8*  PLT 131*    Chemistries  Recent Labs  Lab 08/01/17 0541  NA 137  K 4.5  CL 106  CO2 19*  GLUCOSE 401*  BUN 27*  CREATININE 1.13  CALCIUM 8.6*     Follow-up Information    Idelle Crouch, MD. Go in 2 week(s).   Specialty:  Internal Medicine Why:  Follow up with Dr Doy Hutching on  Friday 08-14-17 @ 3:45pm Contact information: [place] [address] [phone]        Teodoro Spray, MD. Go on 08/12/2017.   Specialty:  Cardiology Why:  at 3:30pm.  Contact information: [place] [address] [phone]           Management plans discussed with the patient, family and they are in agreement.  CODE STATUS: Full Code   TOTAL TIME TAKING CARE OF THIS PATIENT: 45 minutes.    Max Sane M.D on 08/02/2017 at 2:36 PM  Between 7am to 6pm - Pager - [phone]  After 6pm go to [email or web address] - Proofreader  [facility]  Office  [phone]  CC: Primary care physician; Idelle Crouch, MD   Note: This dictation was prepared with Dragon dictation along with smaller phrase technology. Any transcriptional errors that result from this process are unintentional.

## 2017-08-07 ENCOUNTER — Other Ambulatory Visit: Payer: Self-pay | Admitting: *Deleted

## 2017-08-07 DIAGNOSIS — E7800 Pure hypercholesterolemia, unspecified: Secondary | ICD-10-CM | POA: Diagnosis not present

## 2017-08-07 DIAGNOSIS — I5033 Acute on chronic diastolic (congestive) heart failure: Secondary | ICD-10-CM | POA: Diagnosis not present

## 2017-08-07 DIAGNOSIS — I63541 Cerebral infarction due to unspecified occlusion or stenosis of right cerebellar artery: Secondary | ICD-10-CM | POA: Diagnosis not present

## 2017-08-07 DIAGNOSIS — I25810 Atherosclerosis of coronary artery bypass graft(s) without angina pectoris: Secondary | ICD-10-CM | POA: Diagnosis not present

## 2017-08-07 DIAGNOSIS — I10 Essential (primary) hypertension: Secondary | ICD-10-CM | POA: Diagnosis not present

## 2017-08-07 DIAGNOSIS — L97511 Non-pressure chronic ulcer of other part of right foot limited to breakdown of skin: Secondary | ICD-10-CM | POA: Diagnosis not present

## 2017-08-07 DIAGNOSIS — I480 Paroxysmal atrial fibrillation: Secondary | ICD-10-CM | POA: Diagnosis not present

## 2017-08-07 DIAGNOSIS — I350 Nonrheumatic aortic (valve) stenosis: Secondary | ICD-10-CM | POA: Diagnosis not present

## 2017-08-07 NOTE — Patient Outreach (Signed)
[facility]) Care Management  08/07/2017  Marcus Williams [birth year] [id number]  Referral via [name]; member discharged from inpatient admission from [facility] 2./15/2019  Per chart review patient had admission from 2/12-2/15/2019 -dx NSTEMI. Patient went back to emergency room on day of d/c 2/15 with difficulty breathing and was observed in hospital 2 days with discharge 08/02/2017.  Telephone call attempt x 1; phone rings-no answer; unable to leave message.  Plan: Will follow up.  [name], RN BSN [place] Management Coordinator [facility] Care Management  [phone]

## 2017-08-10 ENCOUNTER — Inpatient Hospital Stay
Admission: EM | Admit: 2017-08-10 | Discharge: 2017-08-14 | DRG: 871 | Disposition: A | Discharge status: Home Health | Payer: Medicare HMO | Attending: Internal Medicine | Admitting: Internal Medicine

## 2017-08-10 ENCOUNTER — Emergency Department: Payer: Medicare HMO

## 2017-08-10 ENCOUNTER — Other Ambulatory Visit: Payer: Self-pay | Admitting: *Deleted

## 2017-08-10 ENCOUNTER — Other Ambulatory Visit: Payer: Self-pay

## 2017-08-10 DIAGNOSIS — M1990 Unspecified osteoarthritis, unspecified site: Secondary | ICD-10-CM | POA: Diagnosis present

## 2017-08-10 DIAGNOSIS — N179 Acute kidney failure, unspecified: Secondary | ICD-10-CM | POA: Diagnosis not present

## 2017-08-10 DIAGNOSIS — Y95 Nosocomial condition: Secondary | ICD-10-CM | POA: Diagnosis present

## 2017-08-10 DIAGNOSIS — J181 Lobar pneumonia, unspecified organism: Secondary | ICD-10-CM | POA: Diagnosis present

## 2017-08-10 DIAGNOSIS — E11649 Type 2 diabetes mellitus with hypoglycemia without coma: Secondary | ICD-10-CM | POA: Diagnosis not present

## 2017-08-10 DIAGNOSIS — Z8673 Personal history of transient ischemic attack (TIA), and cerebral infarction without residual deficits: Secondary | ICD-10-CM | POA: Diagnosis not present

## 2017-08-10 DIAGNOSIS — N170 Acute kidney failure with tubular necrosis: Secondary | ICD-10-CM | POA: Diagnosis not present

## 2017-08-10 DIAGNOSIS — I110 Hypertensive heart disease with heart failure: Secondary | ICD-10-CM | POA: Diagnosis not present

## 2017-08-10 DIAGNOSIS — I252 Old myocardial infarction: Secondary | ICD-10-CM | POA: Diagnosis not present

## 2017-08-10 DIAGNOSIS — Z79899 Other long term (current) drug therapy: Secondary | ICD-10-CM

## 2017-08-10 DIAGNOSIS — I350 Nonrheumatic aortic (valve) stenosis: Secondary | ICD-10-CM | POA: Diagnosis present

## 2017-08-10 DIAGNOSIS — J440 Chronic obstructive pulmonary disease with acute lower respiratory infection: Secondary | ICD-10-CM | POA: Diagnosis present

## 2017-08-10 DIAGNOSIS — Z7902 Long term (current) use of antithrombotics/antiplatelets: Secondary | ICD-10-CM

## 2017-08-10 DIAGNOSIS — A419 Sepsis, unspecified organism: Principal | ICD-10-CM

## 2017-08-10 DIAGNOSIS — G253 Myoclonus: Secondary | ICD-10-CM | POA: Diagnosis not present

## 2017-08-10 DIAGNOSIS — J449 Chronic obstructive pulmonary disease, unspecified: Secondary | ICD-10-CM | POA: Diagnosis not present

## 2017-08-10 DIAGNOSIS — Z955 Presence of coronary angioplasty implant and graft: Secondary | ICD-10-CM

## 2017-08-10 DIAGNOSIS — I482 Chronic atrial fibrillation: Secondary | ICD-10-CM | POA: Diagnosis not present

## 2017-08-10 DIAGNOSIS — Z794 Long term (current) use of insulin: Secondary | ICD-10-CM | POA: Diagnosis not present

## 2017-08-10 DIAGNOSIS — Z7901 Long term (current) use of anticoagulants: Secondary | ICD-10-CM | POA: Diagnosis not present

## 2017-08-10 DIAGNOSIS — I10 Essential (primary) hypertension: Secondary | ICD-10-CM | POA: Diagnosis not present

## 2017-08-10 DIAGNOSIS — J189 Pneumonia, unspecified organism: Secondary | ICD-10-CM | POA: Diagnosis not present

## 2017-08-10 DIAGNOSIS — I5022 Chronic systolic (congestive) heart failure: Secondary | ICD-10-CM | POA: Diagnosis not present

## 2017-08-10 DIAGNOSIS — Z89421 Acquired absence of other right toe(s): Secondary | ICD-10-CM | POA: Diagnosis not present

## 2017-08-10 DIAGNOSIS — I4891 Unspecified atrial fibrillation: Secondary | ICD-10-CM | POA: Diagnosis not present

## 2017-08-10 DIAGNOSIS — R05 Cough: Secondary | ICD-10-CM | POA: Diagnosis not present

## 2017-08-10 DIAGNOSIS — K219 Gastro-esophageal reflux disease without esophagitis: Secondary | ICD-10-CM | POA: Diagnosis present

## 2017-08-10 DIAGNOSIS — Z8679 Personal history of other diseases of the circulatory system: Secondary | ICD-10-CM

## 2017-08-10 DIAGNOSIS — Z951 Presence of aortocoronary bypass graft: Secondary | ICD-10-CM | POA: Diagnosis not present

## 2017-08-10 DIAGNOSIS — G4733 Obstructive sleep apnea (adult) (pediatric): Secondary | ICD-10-CM | POA: Diagnosis present

## 2017-08-10 DIAGNOSIS — I2510 Atherosclerotic heart disease of native coronary artery without angina pectoris: Secondary | ICD-10-CM | POA: Diagnosis present

## 2017-08-10 DIAGNOSIS — Z9989 Dependence on other enabling machines and devices: Secondary | ICD-10-CM

## 2017-08-10 DIAGNOSIS — E119 Type 2 diabetes mellitus without complications: Secondary | ICD-10-CM | POA: Diagnosis not present

## 2017-08-10 LAB — URINALYSIS, ROUTINE W REFLEX MICROSCOPIC
Bacteria, UA: NONE SEEN
Bilirubin Urine: NEGATIVE
Glucose, UA: 150 mg/dL — AB
Ketones, ur: NEGATIVE mg/dL
LEUKOCYTES UA: NEGATIVE
NITRITE: NEGATIVE
Protein, ur: 100 mg/dL — AB
Specific Gravity, Urine: 1.013 (ref 1.005–1.030)
pH: 5 (ref 5.0–8.0)

## 2017-08-10 LAB — BASIC METABOLIC PANEL
ANION GAP: 12 (ref 5–15)
BUN: 23 mg/dL — ABNORMAL HIGH (ref 6–20)
CHLORIDE: 94 mmol/L — AB (ref 101–111)
CO2: 24 mmol/L (ref 22–32)
Calcium: 8.2 mg/dL — ABNORMAL LOW (ref 8.9–10.3)
Creatinine, Ser: 1.36 mg/dL — ABNORMAL HIGH (ref 0.61–1.24)
GFR, EST NON AFRICAN AMERICAN: 55 mL/min — AB (ref >60)
Glucose, Bld: 284 mg/dL — ABNORMAL HIGH (ref 65–99)
POTASSIUM: 3.8 mmol/L (ref 3.5–5.1)
SODIUM: 130 mmol/L — AB (ref 135–145)

## 2017-08-10 LAB — CBC
HCT: 45 % (ref 40.0–52.0)
HEMOGLOBIN: 14.9 g/dL (ref 13.0–18.0)
MCH: 29.2 pg (ref 26.0–34.0)
MCHC: 33.2 g/dL (ref 32.0–36.0)
MCV: 88.1 fL (ref 80.0–100.0)
PLATELETS: 233 10*3/uL (ref 150–440)
RBC: 5.11 MIL/uL (ref 4.40–5.90)
RDW: 14.5 % (ref 11.5–14.5)
WBC: 15.5 10*3/uL — AB (ref 3.8–10.6)

## 2017-08-10 LAB — INFLUENZA PANEL BY PCR (TYPE A & B)
INFLBPCR: NEGATIVE
Influenza A By PCR: NEGATIVE

## 2017-08-10 LAB — TROPONIN I: TROPONIN I: 0.18 ng/mL — AB (ref <0.03)

## 2017-08-10 LAB — GLUCOSE, CAPILLARY: Glucose-Capillary: 252 mg/dL — ABNORMAL HIGH (ref 65–99)

## 2017-08-10 LAB — PROCALCITONIN: Procalcitonin: 0.27 ng/mL

## 2017-08-10 LAB — LACTIC ACID, PLASMA
LACTIC ACID, VENOUS: 1.4 mmol/L (ref 0.5–1.9)
Lactic Acid, Venous: 1.3 mmol/L (ref 0.5–1.9)

## 2017-08-10 MED ORDER — PANTOPRAZOLE SODIUM 40 MG PO TBEC
40.0000 mg | DELAYED_RELEASE_TABLET | Freq: Every day | ORAL | Status: DC
  Administered 2017-08-11 – 2017-08-14 (×4): 40 mg via ORAL
  Filled 2017-08-10 (×4): qty 1

## 2017-08-10 MED ORDER — PRAMIPEXOLE DIHYDROCHLORIDE 0.25 MG PO TABS
0.5000 mg | ORAL_TABLET | Freq: Three times a day (TID) | ORAL | Status: DC
  Administered 2017-08-10 – 2017-08-14 (×11): 0.5 mg via ORAL
  Filled 2017-08-10 (×11): qty 2

## 2017-08-10 MED ORDER — HYDROCOD POLST-CPM POLST ER 10-8 MG/5ML PO SUER
5.0000 mL | Freq: Two times a day (BID) | ORAL | Status: DC
  Administered 2017-08-10 – 2017-08-14 (×8): 5 mL via ORAL
  Filled 2017-08-10 (×8): qty 5

## 2017-08-10 MED ORDER — INSULIN DETEMIR 100 UNIT/ML ~~LOC~~ SOLN
70.0000 [IU] | Freq: Two times a day (BID) | SUBCUTANEOUS | Status: DC
  Administered 2017-08-11 – 2017-08-13 (×5): 70 [IU] via SUBCUTANEOUS
  Filled 2017-08-10 (×8): qty 0.7

## 2017-08-10 MED ORDER — UMECLIDINIUM-VILANTEROL 62.5-25 MCG/INH IN AEPB
1.0000 | INHALATION_SPRAY | Freq: Every day | RESPIRATORY_TRACT | Status: DC
  Administered 2017-08-11 – 2017-08-14 (×4): 1 via RESPIRATORY_TRACT
  Filled 2017-08-10: qty 14

## 2017-08-10 MED ORDER — SODIUM CHLORIDE 0.9 % IV SOLN
2.0000 g | Freq: Three times a day (TID) | INTRAVENOUS | Status: DC
  Administered 2017-08-11 – 2017-08-12 (×4): 2 g via INTRAVENOUS
  Filled 2017-08-10 (×6): qty 2

## 2017-08-10 MED ORDER — APIXABAN 5 MG PO TABS
5.0000 mg | ORAL_TABLET | Freq: Two times a day (BID) | ORAL | Status: DC
  Administered 2017-08-10 – 2017-08-14 (×8): 5 mg via ORAL
  Filled 2017-08-10 (×8): qty 1

## 2017-08-10 MED ORDER — BUTALBITAL-APAP-CAFFEINE 50-325-40 MG PO TABS
1.0000 | ORAL_TABLET | Freq: Four times a day (QID) | ORAL | Status: DC | PRN
  Administered 2017-08-11: 1 via ORAL
  Filled 2017-08-10 (×2): qty 1

## 2017-08-10 MED ORDER — ONDANSETRON HCL 4 MG/2ML IJ SOLN
4.0000 mg | Freq: Once | INTRAMUSCULAR | Status: AC
  Administered 2017-08-10: 4 mg via INTRAVENOUS

## 2017-08-10 MED ORDER — DILTIAZEM HCL ER COATED BEADS 180 MG PO CP24
300.0000 mg | ORAL_CAPSULE | Freq: Every day | ORAL | Status: DC
  Administered 2017-08-11 – 2017-08-14 (×3): 300 mg via ORAL
  Filled 2017-08-10 (×3): qty 1

## 2017-08-10 MED ORDER — LISINOPRIL 20 MG PO TABS
20.0000 mg | ORAL_TABLET | Freq: Every day | ORAL | Status: DC
  Administered 2017-08-11: 20 mg via ORAL
  Filled 2017-08-10: qty 1

## 2017-08-10 MED ORDER — SODIUM CHLORIDE 0.9 % IV BOLUS (SEPSIS)
500.0000 mL | Freq: Once | INTRAVENOUS | Status: AC
  Administered 2017-08-10: 500 mL via INTRAVENOUS

## 2017-08-10 MED ORDER — ALBUTEROL SULFATE (2.5 MG/3ML) 0.083% IN NEBU
2.5000 mg | INHALATION_SOLUTION | Freq: Once | RESPIRATORY_TRACT | Status: AC
  Administered 2017-08-10: 2.5 mg via RESPIRATORY_TRACT
  Filled 2017-08-10: qty 3

## 2017-08-10 MED ORDER — MECLIZINE HCL 12.5 MG PO TABS
12.5000 mg | ORAL_TABLET | Freq: Three times a day (TID) | ORAL | Status: DC | PRN
  Filled 2017-08-10: qty 1

## 2017-08-10 MED ORDER — SODIUM CHLORIDE 0.9 % IV SOLN
2.0000 g | Freq: Once | INTRAVENOUS | Status: AC
  Administered 2017-08-10: 2 g via INTRAVENOUS
  Filled 2017-08-10: qty 2

## 2017-08-10 MED ORDER — BACLOFEN 10 MG PO TABS
10.0000 mg | ORAL_TABLET | Freq: Two times a day (BID) | ORAL | Status: DC
  Administered 2017-08-10 – 2017-08-14 (×8): 10 mg via ORAL
  Filled 2017-08-10 (×8): qty 1

## 2017-08-10 MED ORDER — POTASSIUM CHLORIDE CRYS ER 20 MEQ PO TBCR
20.0000 meq | EXTENDED_RELEASE_TABLET | Freq: Every day | ORAL | Status: DC
  Administered 2017-08-11 – 2017-08-14 (×4): 20 meq via ORAL
  Filled 2017-08-10 (×4): qty 1

## 2017-08-10 MED ORDER — METFORMIN HCL 500 MG PO TABS
1000.0000 mg | ORAL_TABLET | Freq: Two times a day (BID) | ORAL | Status: DC
  Administered 2017-08-11 – 2017-08-12 (×3): 1000 mg via ORAL
  Filled 2017-08-10 (×3): qty 2

## 2017-08-10 MED ORDER — DOCUSATE SODIUM 100 MG PO CAPS: 100.0000 mg | ORAL_CAPSULE | Freq: Two times a day (BID) | ORAL | Status: DC | PRN

## 2017-08-10 MED ORDER — CYCLOBENZAPRINE HCL 10 MG PO TABS
10.0000 mg | ORAL_TABLET | Freq: Three times a day (TID) | ORAL | Status: DC | PRN
  Administered 2017-08-11 – 2017-08-14 (×3): 10 mg via ORAL
  Filled 2017-08-10 (×3): qty 1

## 2017-08-10 MED ORDER — IPRATROPIUM-ALBUTEROL 0.5-2.5 (3) MG/3ML IN SOLN: 3.0000 mL | Freq: Four times a day (QID) | RESPIRATORY_TRACT | Status: DC

## 2017-08-10 MED ORDER — VANCOMYCIN HCL 10 G IV SOLR
1250.0000 mg | Freq: Two times a day (BID) | INTRAVENOUS | Status: DC
  Administered 2017-08-11: 1250 mg via INTRAVENOUS
  Filled 2017-08-10 (×3): qty 1250

## 2017-08-10 MED ORDER — VANCOMYCIN HCL IN DEXTROSE 1-5 GM/200ML-% IV SOLN
1000.0000 mg | Freq: Once | INTRAVENOUS | Status: AC
  Administered 2017-08-10: 1000 mg via INTRAVENOUS
  Filled 2017-08-10: qty 200

## 2017-08-10 MED ORDER — METOPROLOL TARTRATE 50 MG PO TABS
100.0000 mg | ORAL_TABLET | Freq: Two times a day (BID) | ORAL | Status: DC
  Administered 2017-08-10 – 2017-08-14 (×5): 100 mg via ORAL
  Filled 2017-08-10 (×5): qty 2

## 2017-08-10 MED ORDER — INSULIN NPH (HUMAN) (ISOPHANE) 100 UNIT/ML ~~LOC~~ SUSP: 70.0000 [IU] | Freq: Two times a day (BID) | SUBCUTANEOUS | Status: DC

## 2017-08-10 MED ORDER — NITROGLYCERIN 0.4 MG SL SUBL: 0.4000 mg | SUBLINGUAL_TABLET | SUBLINGUAL | Status: DC | PRN

## 2017-08-10 MED ORDER — TORSEMIDE 20 MG PO TABS
60.0000 mg | ORAL_TABLET | Freq: Every day | ORAL | Status: DC
  Administered 2017-08-11: 60 mg via ORAL
  Filled 2017-08-10: qty 3

## 2017-08-10 MED ORDER — IPRATROPIUM-ALBUTEROL 0.5-2.5 (3) MG/3ML IN SOLN
3.0000 mL | Freq: Four times a day (QID) | RESPIRATORY_TRACT | Status: DC
  Administered 2017-08-11 – 2017-08-14 (×12): 3 mL via RESPIRATORY_TRACT
  Filled 2017-08-10 (×13): qty 3

## 2017-08-10 MED ORDER — CLOPIDOGREL BISULFATE 75 MG PO TABS
75.0000 mg | ORAL_TABLET | Freq: Every day | ORAL | Status: DC
  Administered 2017-08-11 – 2017-08-14 (×4): 75 mg via ORAL
  Filled 2017-08-10 (×4): qty 1

## 2017-08-10 MED ORDER — ONDANSETRON HCL 4 MG/2ML IJ SOLN
INTRAMUSCULAR | Status: AC
  Filled 2017-08-10: qty 2

## 2017-08-10 MED ORDER — INSULIN ASPART 100 UNIT/ML ~~LOC~~ SOLN
0.0000 [IU] | Freq: Three times a day (TID) | SUBCUTANEOUS | Status: DC
  Administered 2017-08-11: 1 [IU] via SUBCUTANEOUS
  Administered 2017-08-11: 2 [IU] via SUBCUTANEOUS
  Administered 2017-08-12 (×2): 1 [IU] via SUBCUTANEOUS
  Administered 2017-08-12: 3 [IU] via SUBCUTANEOUS
  Administered 2017-08-13: 1 [IU] via SUBCUTANEOUS
  Filled 2017-08-10 (×6): qty 1

## 2017-08-10 MED ORDER — SIMVASTATIN 20 MG PO TABS
40.0000 mg | ORAL_TABLET | Freq: Every day | ORAL | Status: DC
  Administered 2017-08-10 – 2017-08-13 (×4): 40 mg via ORAL
  Filled 2017-08-10 (×4): qty 2

## 2017-08-10 MED ORDER — GABAPENTIN 300 MG PO CAPS
300.0000 mg | ORAL_CAPSULE | Freq: Two times a day (BID) | ORAL | Status: DC
  Administered 2017-08-10 – 2017-08-11 (×3): 300 mg via ORAL
  Filled 2017-08-10 (×3): qty 1

## 2017-08-10 MED ORDER — TRAZODONE HCL 100 MG PO TABS
100.0000 mg | ORAL_TABLET | Freq: Every day | ORAL | Status: DC
  Administered 2017-08-10 – 2017-08-13 (×3): 100 mg via ORAL
  Filled 2017-08-10 (×3): qty 1

## 2017-08-10 NOTE — ED Notes (Signed)
Report off to lorrie rn

## 2017-08-10 NOTE — Progress Notes (Signed)
CODE SEPSIS - PHARMACY COMMUNICATION  **Broad Spectrum Antibiotics should be administered within 1 hour of Sepsis diagnosis**  Time Code Sepsis Called/Page Received: 1858   Antibiotics Ordered: vancomycin + cefepime  Time of 1st antibiotic administration: [place] ,PharmD Clinical Pharmacist  08/10/2017  7:52 PM

## 2017-08-10 NOTE — ED Provider Notes (Addendum)
[facility] Emergency Department Provider Note  ___________________________________________   First MD Initiated Contact with Patient 08/10/17 1831     (approximate)  I have reviewed the triage vital signs and the nursing notes.   HISTORY  Chief Complaint Nausea and Cough   HPI Marcus Williams is a 62 y.o. male with a history of atrial fibrillation on Plavix as well as COPD who is presenting with 3 days of nonproductive cough as well as chills and weakness.  He also has reports that he is having lower abdominal pain with coughing.  Recent stenting several weeks ago but denying any chest pain.  Past Medical History:  Diagnosis Date  . A-fib ([place])   . Aortic stenosis, moderate 11/2015  . Arthritis   . CAD (coronary artery disease)   . Chronic systolic CHF (congestive heart failure) ([place])   . Diabetes mellitus without complication ([place])    INSULIN DEPENDENT  . GERD (gastroesophageal reflux disease)   . History of cardioversion   . Hypertension   . MI (myocardial infarction) ([place])   . OSA on CPAP   . Sepsis ([place])   . Shortness of breath dyspnea   . Stroke ([place])   . SVT (supraventricular tachycardia) [facility])     Patient Active Problem List   Diagnosis Date Noted  . NSTEMI (non-ST elevated myocardial infarction) ([place]) 07/28/2017  . TIA (transient ischemic attack) 03/01/2017  . Aortic stenosis 11/21/2015  . Acute on chronic diastolic CHF (congestive heart failure), NYHA class 3 ([place]) 11/21/2015  . Aortic valve, bicuspid 09/24/2015  . Cellulitis 05/04/2015  . Acute respiratory failure with hypoxia ([place]) 03/26/2015  . CHF (congestive heart failure) ([place]) 01/15/2015  . HTN (hypertension) 01/14/2015  . Type II diabetes mellitus ([place]) 01/14/2015  . COPD (chronic obstructive pulmonary disease) ([place]) 01/14/2015  . OSA on CPAP 01/14/2015  . CAD (coronary artery disease) 01/14/2015  . A-fib ([place]) 01/14/2015  . Pain of left calf 01/14/2015  . Acute on  chronic systolic CHF (congestive heart failure) ([place]) 01/14/2015  . COPD exacerbation ([place]) 12/22/2014  . Diabetes ([place]) 10/14/2014    Past Surgical History:  Procedure Laterality Date  . 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 BUNIONECTOMY    . CARDIAC CATHETERIZATION N/A 10/02/2015   Procedure: Coronary/[place] Angiography;  Surgeon: Teodoro Spray, MD;  Location: [place] CV LAB;  Service: Cardiovascular;  Laterality: N/A;  . CARDIAC CATHETERIZATION N/A 11/21/2015   Procedure: Right and Left Heart Cath;  Surgeon: Sherren Mocha, MD;  Location: [place] CV LAB;  Service: Cardiovascular;  Laterality: N/A;  . CORONARY ARTERY BYPASS GRAFT    . CORONARY STENT INTERVENTION N/A 07/30/2017   Procedure: CORONARY STENT INTERVENTION;  Surgeon: Isaias Cowman, MD;  Location: [place] [facility];  Service: Cardiovascular;  Laterality: N/A;  . KNEE SURGERY    . quadruple bypass    . RIGHT/LEFT HEART CATH AND CORONARY/GRAFT ANGIOGRAPHY N/A 07/30/2017   Procedure: RIGHT/LEFT HEART CATH AND CORONARY/GRAFT ANGIOGRAPHY;  Surgeon: Isaias Cowman, MD;  Location: [place] [facility];  Service: Cardiovascular;  Laterality: N/A;  . TEE WITHOUT CARDIOVERSION  11/21/2015  . TEE WITHOUT CARDIOVERSION N/A 11/21/2015   Procedure: TRANSESOPHAGEAL ECHOCARDIOGRAM (TEE);  Surgeon: Larey Dresser, MD;  Location: [place];  Service: Cardiovascular;  Laterality: N/A;    Prior to Admission medications   Medication Sig Start Date End Date Taking?  Authorizing Provider  albuterol (PROVENTIL HFA;VENTOLIN HFA) 108 (90 Base) MCG/ACT inhaler Inhale 2 puffs into the lungs every 6 (six) hours as needed for wheezing or shortness of breath. 07/11/15   Flora Lipps, MD  apixaban (ELIQUIS) 5 MG TABS tablet Take 5 mg by mouth 2 (two) times daily.    [provider]  baclofen (LIORESAL) 10 MG tablet  Take 10 mg by mouth 2 (two) times daily.     [provider]  chlorpheniramine-HYDROcodone (TUSSIONEX) 10-8 MG/5ML SUER Take 5 mLs by mouth every 12 (twelve) hours. 08/02/17   Epifanio Lesches, MD  clopidogrel (PLAVIX) 75 MG tablet Take 1 tablet (75 mg total) by mouth daily with breakfast. Patient not taking: Reported on 07/31/2017 08/01/17   Max Sane, MD  cyclobenzaprine (FLEXERIL) 10 MG tablet Take 1 tablet (10 mg total) by mouth 3 (three) times daily as needed. Patient taking differently: Take 10 mg by mouth 2 (two) times daily.  05/29/17   Sable Feil, PA-C  diltiazem (CARDIZEM CD) 300 MG 24 hr capsule Take 1 capsule (300 mg total) by mouth daily. 10/15/14   Idelle Crouch, MD  gabapentin (NEURONTIN) 300 MG capsule Take 300 mg by mouth 2 (two) times daily.    [provider]  insulin NPH Human (HUMULIN N,NOVOLIN N) 100 UNIT/ML injection Inject 100 Units into the skin 2 (two) times daily.     [provider]  ipratropium-albuterol (DUONEB) 0.5-2.5 (3) MG/3ML SOLN Take 3 mLs by nebulization every 6 (six) hours. 08/02/17   Epifanio Lesches, MD  lisinopril (PRINIVIL,ZESTRIL) 20 MG tablet Take 20 mg by mouth daily.    [provider]  meclizine (ANTIVERT) 12.5 MG tablet Take 1 tablet (12.5 mg total) by mouth 3 (three) times daily as needed for dizziness. 03/01/17   Hillary Bow, MD  metFORMIN (GLUCOPHAGE) 1000 MG tablet Take 1,000 mg by mouth 2 (two) times daily with a meal.    [provider]  metoprolol (LOPRESSOR) 100 MG tablet Take 100 mg by mouth 2 (two) times daily. 10/15/15   [provider]  nitroGLYCERIN (NITROSTAT) 0.4 MG SL tablet Place 0.4 mg under the tongue every 5 (five) minutes as needed for chest pain.    [provider]  pantoprazole (PROTONIX) 40 MG tablet Take 40 mg by mouth daily.    [provider]  potassium chloride 20 MEQ TBCR Take 20 mEq by mouth daily. 11/23/15   Shirley Friar, PA-C    pramipexole (MIRAPEX) 0.5 MG tablet Take 0.5 mg by mouth 3 (three) times daily.     [provider]  predniSONE (STERAPRED UNI-PAK 21 TAB) 10 MG (21) TBPK tablet Taper by 10 mg daily 08/02/17   Epifanio Lesches, MD  simvastatin (ZOCOR) 40 MG tablet Take 40 mg by mouth at bedtime.     [provider]  torsemide (DEMADEX) 20 MG tablet TAKE 3 TABLETS (60 MG TOTAL) BY MOUTH DAILY. 07/16/16   Shirley Friar, PA-C  traZODone (DESYREL) 100 MG tablet Take 100 mg by mouth at bedtime.    [provider]  umeclidinium-vilanterol (ANORO ELLIPTA) 62.5-25 MCG/INH AEPB Inhale 1 puff into the lungs daily. 10/02/16   Flora Lipps, MD    Allergies Patient has no known allergies.  Family History  Problem Relation Age of Onset  . Stroke Mother   . Heart attack Mother   . Hypertension Mother   . Heart attack Father   . Hypertension Father   . Heart attack Brother        #  1  . Diabetes Brother        #1  . Heart disease Brother        #2  . Lung disease Brother        #2  . Hypertension Brother        #2  . Diabetes Brother        #2    Social History Social History   Tobacco Use  . Smoking status: Never Smoker  . Smokeless tobacco: Never Used  Substance Use Topics  . Alcohol use: No  . Drug use: No    Review of Systems  Constitutional: No fever/chills Eyes: No visual changes. ENT: No sore throat. Cardiovascular: Denies chest pain. Respiratory: As above Gastrointestinal: no vomiting.  No diarrhea.  No constipation. Genitourinary: Negative for dysuria. Musculoskeletal: Negative for back pain. Skin: Negative for rash. Neurological: Negative for headaches, focal weakness or numbness.   ____________________________________________   PHYSICAL EXAM:  VITAL SIGNS: ED Triage Vitals [08/10/17 1540]  Enc Vitals Group     BP 140/87     Pulse Rate (!) 107     Resp 16     Temp 100.2 F (37.9 C)     Temp Source Oral     SpO2 95 %     Weight  284 lb (128.8 kg)     Height _0  (1.753 m)     Head Circumference      Peak Flow      Pain Score 9     Pain Loc      Pain Edu?      Excl. in [place]?     Constitutional: Alert and oriented. Well appearing and in no acute distress. Eyes: Conjunctivae are normal.  Head: Atraumatic. Nose: No congestion/rhinnorhea. Mouth/Throat: Mucous membranes are moist.  Neck: No stridor.   Cardiovascular: Tachycardic, regular rhythm. Grossly normal heart sounds.  Respiratory: Mild tachypnea with prolonged expiratory phase with decreased lung sounds to the left middle and lower fields.   Gastrointestinal: Soft and nontender. No distention.  Musculoskeletal: No lower extremity tenderness nor edema.  No joint effusions. Neurologic:  Normal speech and language. No gross focal neurologic deficits are appreciated. Skin:  Skin is warm, dry and intact. No rash noted. Psychiatric: Mood and affect are normal. Speech and behavior are normal.  ____________________________________________   LABS (all labs ordered are listed, but only abnormal results are displayed)  Labs Reviewed  CBC - Abnormal; Notable for the following components:      Result Value   WBC 15.5 (*)    All other components within normal limits  BASIC METABOLIC PANEL - Abnormal; Notable for the following components:   Sodium 130 (*)    Chloride 94 (*)    Glucose, Bld 284 (*)    BUN 23 (*)    Creatinine, Ser 1.36 (*)    Calcium 8.2 (*)    GFR calc non Af Amer 55 (*)    All other components within normal limits  CULTURE, BLOOD (ROUTINE X 2)  CULTURE, BLOOD (ROUTINE X 2)  INFLUENZA PANEL BY PCR (TYPE A & B)  URINALYSIS, ROUTINE W REFLEX MICROSCOPIC  LACTIC ACID, PLASMA  LACTIC ACID, PLASMA   ____________________________________________  EKG  ED ECG REPORT I, Doran Stabler, the attending physician, personally viewed and interpreted this ECG.   Date: 08/10/2017  EKG Time: 2054  Rate: 106  Rhythm: sinus tachycardia  Axis:  Normal  Intervals:none  ST&T Change: Minimal inferior ST elevation versus baseline wander.  Minimal depression in 1 as well as aVL with T wave inversions in 1 and aVL.  No significant change from previous of July 28, 2017.  ____________________________________________  RADIOLOGY  Left lower lobe pneumonia ____________________________________________   PROCEDURES  Procedure(s) performed:   .Critical Care Performed by: Orbie Pyo, MD Authorized by: Orbie Pyo, MD   Critical care provider statement:    Critical care time (minutes):  35   Critical care was necessary to treat or prevent imminent or life-threatening deterioration of the following conditions:  Respiratory failure   Critical care was time spent personally by me on the following activities:  Examination of patient, ordering and performing treatments and interventions, ordering and review of laboratory studies, ordering and review of radiographic studies and pulse oximetry    Critical Care performed:   ____________________________________________   INITIAL IMPRESSION / ASSESSMENT AND PLAN / ED COURSE  Pertinent labs & imaging results that were available during my care of the patient were reviewed by me and considered in my medical decision making (see chart for details).  Differential includes, but is not limited to, viral syndrome, bronchitis including COPD exacerbation, pneumonia, reactive airway disease including asthma, CHF including exacerbation with or without pulmonary/interstitial edema, pneumothorax, ACS, thoracic trauma, and pulmonary embolism. As part of my medical decision making, I reviewed the following data within the electronic MEDICAL RECORD NUMBER Notes from prior ED visits    Clinical Course as of Aug 11 1923  Mon Aug 10, 2017  [place] DG Chest 2 View [DS]    Clinical Course User Index [DS] Orbie Pyo, MD   ----------------------------------------- 7:28  PM on 08/10/2017 -----------------------------------------  Patient with sepsis criteria met.  Patient will be admitted to the hospital.  Signed out to Dr. Anselm Jungling.  Patient aware of need for admission to the hospital for pneumonia and willing to comply.  He will be given the hospital-acquired antibiotic protocol secondary to being admitted recently.  ____________________________________________   FINAL CLINICAL IMPRESSION(S) / ED DIAGNOSES  hcap   NEW MEDICATIONS STARTED DURING THIS VISIT:  New Prescriptions   No medications on file     Note:  This document was prepared using Dragon voice recognition software and may include unintentional dictation errors.     Orbie Pyo, MD 08/10/17 1928  Elevated troponin which is likely on the downward trend from the previous elevated troponin when the patient had stents placed several weeks ago.  Symptoms today likely due to infectious cause rather than cardiac ischemia.    Orbie Pyo, MD 08/10/17 8830    Clearnce Hasten Randall An, MD 08/20/17 2252

## 2017-08-10 NOTE — H&P (Signed)
[facility] [place] NAME: Marcus Williams    MR#:  [id number]  DATE OF BIRTH:  24-Jun-1955  DATE OF ADMISSION:  08/10/2017  PRIMARY CARE PHYSICIAN: Idelle Crouch, MD   REQUESTING/REFERRING PHYSICIAN: [place]  CHIEF COMPLAINT:   Chief Complaint  Patient presents with  . Nausea  . Cough    HISTORY OF PRESENT ILLNESS: Marcus Williams  is a 62 y.o. male with a known history of A. fib, aortic stenosis, coronary artery disease with recent stent 2 weeks ago, congestive heart failure, diabetes, hypertension, stroke, supraventricular tachycardia, obstructive sleep apnea on CPAP- admitted twice in last 2 weeks, first for non-ST elevation MI and then came with COPD exacerbation. For last 2-3 days patient is feeling excessive shortness of breath and has worsening of cough with some low-grade fever so decided to come to emergency room today. He is feeling short of breath with minimal exertion and denies any symptoms of orthopnea or swelling on the legs. In ER noted to have new finding of pneumonia on chest x-ray and so starting on Rocephin antibiotic because of his history of being in hospital twice in last 2 weeks and given to hospitalist team for further management.  PAST MEDICAL HISTORY:   Past Medical History:  Diagnosis Date  . A-fib ([place])   . Aortic stenosis, moderate 11/2015  . Arthritis   . CAD (coronary artery disease)   . Chronic systolic CHF (congestive heart failure) ([place])   . Diabetes mellitus without complication ([place])    INSULIN DEPENDENT  . GERD (gastroesophageal reflux disease)   . History of cardioversion   . Hypertension   . MI (myocardial infarction) ([place])   . OSA on CPAP   . Sepsis ([place])   . Shortness of breath dyspnea   . Stroke ([place])   . SVT (supraventricular tachycardia) ([place])     PAST SURGICAL HISTORY:  Past Surgical History:  Procedure Laterality Date  . 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 BUNIONECTOMY    . CARDIAC CATHETERIZATION N/A 10/02/2015   Procedure: Coronary/Grafts Angiography;  Surgeon: Teodoro Spray, MD;  Location: [place] [facility] LAB;  Service: Cardiovascular;  Laterality: N/A;  . CARDIAC CATHETERIZATION N/A 11/21/2015   Procedure: Right and Left Heart Cath;  Surgeon: Sherren Mocha, MD;  Location: [facility] LAB;  Service: Cardiovascular;  Laterality: N/A;  . CORONARY ARTERY BYPASS GRAFT    . CORONARY STENT INTERVENTION N/A 07/30/2017   Procedure: CORONARY STENT INTERVENTION;  Surgeon: Isaias Cowman, MD;  Location: [place] [facility] LAB;  Service: Cardiovascular;  Laterality: N/A;  . KNEE SURGERY    . quadruple bypass    . RIGHT/LEFT HEART CATH AND CORONARY/GRAFT ANGIOGRAPHY N/A 07/30/2017   Procedure: RIGHT/LEFT HEART CATH AND CORONARY/GRAFT ANGIOGRAPHY;  Surgeon: Isaias Cowman, MD;  Location: [employer] CV LAB;  Service: Cardiovascular;  Laterality: N/A;  . TEE WITHOUT CARDIOVERSION  11/21/2015  . TEE WITHOUT CARDIOVERSION N/A 11/21/2015   Procedure: TRANSESOPHAGEAL ECHOCARDIOGRAM (TEE);  Surgeon: Larey Dresser, MD;  Location: [facility] END[facility];  Service: Cardiovascular;  Laterality: N/A;    SOCIAL HISTORY:  Social History   Tobacco Use  . Smoking status: Never Smoker  . Smokeless tobacco: Never Used  Substance Use Topics  . Alcohol use: No    FAMILY HISTORY:  Family History  Problem Relation Age of Onset  . Stroke  Mother   . Heart attack Mother   . Hypertension Mother   . Heart attack Father   . Hypertension Father   . Heart attack Brother        #1  . Diabetes Brother        #1  . Heart disease Brother        #2  . Lung disease Brother        #2  . Hypertension Brother        #2  . Diabetes Brother        #2    DRUG ALLERGIES: No Known Allergies  REVIEW OF SYSTEMS:   CONSTITUTIONAL: No fever, fatigue or weakness.  EYES: No blurred  or double vision.  EARS, NOSE, AND THROAT: No tinnitus or ear pain.  RESPIRATORY: positive for cough, shortness of breath, wheezing ,no hemoptysis.  CARDIOVASCULAR: No chest pain, orthopnea, edema.  GASTROINTESTINAL: No nausea, vomiting, diarrhea or abdominal pain.  GENITOURINARY: No dysuria, hematuria.  ENDOCRINE: No polyuria, nocturia,  HEMATOLOGY: No anemia, easy bruising or bleeding SKIN: No rash or lesion. MUSCULOSKELETAL: No joint pain or arthritis.   NEUROLOGIC: No tingling, numbness, weakness.  PSYCHIATRY: No anxiety or depression.   MEDICATIONS AT HOME:  Prior to Admission medications   Medication Sig Start Date End Date Taking? Authorizing Provider  albuterol (PROVENTIL HFA;VENTOLIN HFA) 108 (90 Base) MCG/ACT inhaler Inhale 2 puffs into the lungs every 6 (six) hours as needed for wheezing or shortness of breath. 07/11/15   Flora Lipps, MD  apixaban (ELIQUIS) 5 MG TABS tablet Take 5 mg by mouth 2 (two) times daily.    [provider]  baclofen (LIORESAL) 10 MG tablet Take 10 mg by mouth 2 (two) times daily.     [provider]  chlorpheniramine-HYDROcodone (TUSSIONEX) 10-8 MG/5ML SUER Take 5 mLs by mouth every 12 (twelve) hours. 08/02/17   Epifanio Lesches, MD  clopidogrel (PLAVIX) 75 MG tablet Take 1 tablet (75 mg total) by mouth daily with breakfast. Patient not taking: Reported on 07/31/2017 08/01/17   Max Sane, MD  cyclobenzaprine (FLEXERIL) 10 MG tablet Take 1 tablet (10 mg total) by mouth 3 (three) times daily as needed. Patient taking differently: Take 10 mg by mouth 2 (two) times daily.  05/29/17   Sable Feil, PA-C  diltiazem (CARDIZEM CD) 300 MG 24 hr capsule Take 1 capsule (300 mg total) by mouth daily. 10/15/14   Idelle Crouch, MD  gabapentin (NEURONTIN) 300 MG capsule Take 300 mg by mouth 2 (two) times daily.    [provider]  insulin NPH Human (HUMULIN N,NOVOLIN N) 100 UNIT/ML injection Inject 100 Units into the skin 2 (two)  times daily.     [provider]  ipratropium-albuterol (DUONEB) 0.5-2.5 (3) MG/3ML SOLN Take 3 mLs by nebulization every 6 (six) hours. 08/02/17   Epifanio Lesches, MD  lisinopril (PRINIVIL,ZESTRIL) 20 MG tablet Take 20 mg by mouth daily.    [provider]  meclizine (ANTIVERT) 12.5 MG tablet Take 1 tablet (12.5 mg total) by mouth 3 (three) times daily as needed for dizziness. 03/01/17   Hillary Bow, MD  metFORMIN (GLUCOPHAGE) 1000 MG tablet Take 1,000 mg by mouth 2 (two) times daily with a meal.    [provider]  metoprolol (LOPRESSOR) 100 MG tablet Take 100 mg by mouth 2 (two) times daily. 10/15/15   [provider]  nitroGLYCERIN (NITROSTAT) 0.4 MG SL tablet Place 0.4 mg under the tongue every 5 (five) minutes as  needed for chest pain.    [provider]  pantoprazole (PROTONIX) 40 MG tablet Take 40 mg by mouth daily.    [provider]  potassium chloride 20 MEQ TBCR Take 20 mEq by mouth daily. 11/23/15   Shirley Friar, PA-C  pramipexole (MIRAPEX) 0.5 MG tablet Take 0.5 mg by mouth 3 (three) times daily.     [provider]  predniSONE (STERAPRED UNI-PAK 21 TAB) 10 MG (21) TBPK tablet Taper by 10 mg daily 08/02/17   Epifanio Lesches, MD  simvastatin (ZOCOR) 40 MG tablet Take 40 mg by mouth at bedtime.     [provider]  torsemide (DEMADEX) 20 MG tablet TAKE 3 TABLETS (60 MG TOTAL) BY MOUTH DAILY. 07/16/16   Shirley Friar, PA-C  traZODone (DESYREL) 100 MG tablet Take 100 mg by mouth at bedtime.    [provider]  umeclidinium-vilanterol (ANORO ELLIPTA) 62.5-25 MCG/INH AEPB Inhale 1 puff into the lungs daily. 10/02/16   Flora Lipps, MD      PHYSICAL EXAMINATION:   VITAL SIGNS: Blood pressure 135/90, pulse (!) 113, temperature 100.2 F (37.9 C), temperature source Oral, resp. rate 17, height 5\' 9"  (1.753 m), weight 128.8 kg (284 lb), SpO2 97 %.  GENERAL:  62 y.o.-year-old patient  lying in the bed with no acute distress.  EYES: Pupils equal, round, reactive to light and accommodation. No scleral icterus. Extraocular muscles intact.  HEENT: Head atraumatic, normocephalic. Oropharynx and nasopharynx clear.  NECK:  Supple, no jugular venous distention. No thyroid enlargement, no tenderness.  LUNGS: Normal breath sounds bilaterally, no wheezing, bilateral crepitation. No use of accessory muscles of respiration.  CARDIOVASCULAR: S1, S2 normal. No murmurs, rubs, or gallops.  ABDOMEN: Soft, nontender, nondistended. Bowel sounds present. No organomegaly or mass.  EXTREMITIES: No pedal edema, cyanosis, or clubbing.  NEUROLOGIC: Cranial nerves II through XII are intact. Muscle strength 5/5 in all extremities. Sensation intact. Gait not checked.  PSYCHIATRIC: The patient is alert and oriented x 3.  SKIN: No obvious rash, lesion, or ulcer.   LABORATORY PANEL:   CBC Recent Labs  Lab 08/10/17 1538  WBC 15.5*  HGB 14.9  HCT 45.0  PLT 233  MCV 88.1  MCH 29.2  MCHC 33.2  RDW 14.5   ------------------------------------------------------------------------------------------------------------------  Chemistries  Recent Labs  Lab 08/10/17 1538  NA 130*  K 3.8  CL 94*  CO2 24  GLUCOSE 284*  BUN 23*  CREATININE 1.36*  CALCIUM 8.2*   ------------------------------------------------------------------------------------------------------------------ estimated creatinine clearance is 75.8 mL/min (A) (by C-G formula based on SCr of 1.36 mg/dL (H)). ------------------------------------------------------------------------------------------------------------------ No results for input(s): TSH, T4TOTAL, T3FREE, THYROIDAB in the last 72 hours.  Invalid input(s): FREET3   Coagulation profile No results for input(s): INR, PROTIME in the last 168 hours. ------------------------------------------------------------------------------------------------------------------- No results  for input(s): DDIMER in the last 72 hours. -------------------------------------------------------------------------------------------------------------------  Cardiac Enzymes No results for input(s): CKMB, TROPONINI, MYOGLOBIN in the last 168 hours.  Invalid input(s): CK ------------------------------------------------------------------------------------------------------------------ Invalid input(s): POCBNP  ---------------------------------------------------------------------------------------------------------------  Urinalysis    Component Value Date/Time   COLORURINE STRAW (A) 06/23/2017 0140   APPEARANCEUR CLEAR (A) 06/23/2017 0140   APPEARANCEUR Clear 07/21/2014 1803   LABSPEC 1.018 06/23/2017 0140   LABSPEC 1.011 07/21/2014 1803   PHURINE 5.0 06/23/2017 0140   GLUCOSEU >=500 (A) 06/23/2017 0140   GLUCOSEU >=500 07/21/2014 1803   HGBUR SMALL (A) 06/23/2017 0140   BILIRUBINUR NEGATIVE 06/23/2017 0140   BILIRUBINUR Negative 07/21/2014 1803   KETONESUR NEGATIVE 06/23/2017 0140  PROTEINUR NEGATIVE 06/23/2017 0140   UROBILINOGEN 0.2 07/28/2014 1449   NITRITE NEGATIVE 06/23/2017 0140   LEUKOCYTESUR NEGATIVE 06/23/2017 0140   LEUKOCYTESUR Negative 07/21/2014 1803     RADIOLOGY: Dg Chest 2 View  Result Date: 08/10/2017 CLINICAL DATA:  Body aches, weakness, cough EXAM: CHEST  2 VIEW COMPARISON:  07/31/2017 FINDINGS: There is airspace disease in the superior segment of the left lower lobe. There is no other focal parenchymal opacity. There is no pleural effusion or pneumothorax. The heart and mediastinal contours are unremarkable. There is evidence of prior CABG. The osseous structures are unremarkable. IMPRESSION: Left lower lobe pneumonia. Followup PA and lateral chest X-ray is recommended in 3-4 weeks following trial of antibiotic therapy to ensure resolution and exclude underlying malignancy. Electronically Signed   By: Kathreen Devoid   On: 08/10/2017 16:18    EKG: Orders  placed or performed during the hospital encounter of 08/10/17  . ED EKG  . ED EKG    IMPRESSION AND PLAN:  * Healthcare associated pneumonia   Cefepime and vancomycin, MRSA screening, sputum culture.   Incentive spirometry.  * COPD   Currently no active wheezing, continue inhalers and nebulizers.  * Recent non-ST elevation MI   Continue Plavix, statin, beta blocker, Eliquis, Lisinopril.  * Chronic systolic CHF   Currently no exacerbation symptoms,   Continue diuretics, lisinopril  * Diabetes   Will continue his basal insulin at slightly reduced dose and keep on sliding scale coverage.  * A. fib    Continue Cardizem, metoprolol, and anticoagulation.  All the records are reviewed and case discussed with ED provider. Management plans discussed with the patient, family and they are in agreement.  CODE STATUS: Full code Code Status History    Date Active Date Inactive Code Status Order ID Comments User Context   07/31/2017 20:45 08/02/2017 16:48 Full Code [id number]  Henreitta Leber, MD Inpatient   07/28/2017 22:30 07/31/2017 14:28 Full Code [id number]  Dustin Flock, MD Inpatient   03/01/2017 02:03 03/01/2017 20:04 Full Code [id number]  Harvie Bridge, DO Inpatient   11/21/2015 16:20 11/23/2015 17:10 Full Code [id number]  Sherren Mocha, MD Inpatient   05/06/2015 15:45 1Mar 10, 202016 16:09 Full Code [id number]  Sharlotte Alamo, MD Inpatient   05/04/2015 19:51 05/06/2015 15:45 Full Code [id number]  Vaughan Basta, MD ED   01/14/2015 14:16 01/15/2015 15:14 Full Code [id number]  Lance Coon, MD Inpatient   12/22/2014 18:25 12/23/2014 16:12 Full Code [id number]  Epifanio Lesches, MD ED   10/14/2014 12:14 10/15/2014 13:50 Full Code [id number]  Alfonso Patten, RN Inpatient       TOTAL TIME TAKING CARE OF THIS PATIENT: 50 minutes.    Vaughan Basta M.D on 08/10/2017   Between 7am to 6pm - Pager - [phone]  After 6pm go to [email or web address] - password [email or web address] [place]  [facility]  Office  [phone]  CC: Primary care physician; Idelle Crouch, MD   Note: This dictation was prepared with Dragon dictation along with smaller phrase technology. Any transcriptional errors that result from this process are unintentional.

## 2017-08-10 NOTE — ED Notes (Signed)
Date and time results received: 08/10/17 (use smartphrase ".now" to insert current time)  Test: troponin Critical Value:0.18  Name of Provider Notified: [email or web address]  Orders Received? Or Actions Taken?: MD notified

## 2017-08-10 NOTE — ED Triage Notes (Signed)
Pt c/o bodyaches with generalized weakness and cough for the past couple of days,. Pt had recent cardiac stent placement x3 about 2 weeks ago.

## 2017-08-10 NOTE — Progress Notes (Signed)
Pharmacy Antibiotic Note  Marcus Williams is a 62 y.o. male admitted on 08/10/2017 with pneumonia.  Pharmacy has been consulted for vancomycin and cefepime dosing.  Plan: Ke= 0.067 h-1 Vd=  65.8 L  Height: 5\' 9"  (175.3 cm) Weight: 284 lb (128.8 kg) IBW/kg (Calculated) : 70.7  Temp (24hrs), Avg:100.2 F (37.9 C), Min:100.2 F (37.9 C), Max:100.2 F (37.9 C)  Recent Labs  Lab 08/10/17 1538 08/10/17 1851  WBC 15.5*  --   CREATININE 1.36*  --   LATICACIDVEN  --  1.3    Estimated Creatinine Clearance: 75.8 mL/min (A) (by C-G formula based on SCr of 1.36 mg/dL (H)).    No Known Allergies  Antimicrobials this admission: cefepime 2/25 >>  vancomycin 2/25 >> \  Dose adjustments this admission:   Microbiology results: BCx: sent Sputum: sent  MRSA PCR: sent  Thank you for allowing pharmacy to be a part of this patient's care.  Marcus Williams 08/10/2017 10:02 PM

## 2017-08-10 NOTE — Patient Outreach (Signed)
[facility]) Care Management  08/10/2017  CHIKE FARRINGTON 1956/06/13 [id number]  Per ADT notification-patient is in ED currently.  Plan: Follow up tomorrow.  [name], RN BSN [place] Management Coordinator [facility] Care Management  [phone]

## 2017-08-10 NOTE — ED Notes (Signed)
Pt has a cough and nausea for 3 days.  Pt reports recent cardiac stent placement and has not done well since.  No chest pain or sob.  nonproductive cough.  No fever.  Nonsmoker.  Pt alert  Speech clear.  Family with pt.  Sinus tach on monitor.

## 2017-08-11 ENCOUNTER — Other Ambulatory Visit: Payer: Self-pay | Admitting: *Deleted

## 2017-08-11 ENCOUNTER — Other Ambulatory Visit: Payer: Self-pay

## 2017-08-11 LAB — GLUCOSE, CAPILLARY
GLUCOSE-CAPILLARY: 188 mg/dL — AB (ref 65–99)
Glucose-Capillary: 149 mg/dL — ABNORMAL HIGH (ref 65–99)
Glucose-Capillary: 116 mg/dL — ABNORMAL HIGH (ref 65–99)
Glucose-Capillary: 116 mg/dL — ABNORMAL HIGH (ref 65–99)

## 2017-08-11 LAB — BASIC METABOLIC PANEL
ANION GAP: 9 (ref 5–15)
BUN: 23 mg/dL — ABNORMAL HIGH (ref 6–20)
CALCIUM: 7.9 mg/dL — AB (ref 8.9–10.3)
CHLORIDE: 100 mmol/L — AB (ref 101–111)
CO2: 26 mmol/L (ref 22–32)
Creatinine, Ser: 1.12 mg/dL (ref 0.61–1.24)
GFR calc non Af Amer: >60 mL/min (ref >60)
Glucose, Bld: 245 mg/dL — ABNORMAL HIGH (ref 65–99)
Potassium: 3.4 mmol/L — ABNORMAL LOW (ref 3.5–5.1)
Sodium: 135 mmol/L (ref 135–145)

## 2017-08-11 LAB — CBC
HCT: 39.2 % — ABNORMAL LOW (ref 40.0–52.0)
Hemoglobin: 13.3 g/dL (ref 13.0–18.0)
MCH: 29.8 pg (ref 26.0–34.0)
MCHC: 33.9 g/dL (ref 32.0–36.0)
MCV: 87.9 fL (ref 80.0–100.0)
Platelets: 175 10*3/uL (ref 150–440)
RBC: 4.45 MIL/uL (ref 4.40–5.90)
RDW: 14.6 % — ABNORMAL HIGH (ref 11.5–14.5)
WBC: 12.4 10*3/uL — ABNORMAL HIGH (ref 3.8–10.6)

## 2017-08-11 LAB — PROCALCITONIN: Procalcitonin: 0.23 ng/mL

## 2017-08-11 LAB — MRSA PCR SCREENING: MRSA by PCR: NEGATIVE

## 2017-08-11 MED ORDER — SODIUM CHLORIDE 0.9 % IV BOLUS (SEPSIS): 500.0000 mL | Freq: Once | INTRAVENOUS | Status: DC

## 2017-08-11 MED ORDER — BENZONATATE 100 MG PO CAPS
200.0000 mg | ORAL_CAPSULE | Freq: Three times a day (TID) | ORAL | Status: DC
  Administered 2017-08-11 – 2017-08-14 (×10): 200 mg via ORAL
  Filled 2017-08-11 (×11): qty 2

## 2017-08-11 NOTE — Care Management (Signed)
Third admission since 2/12. Was admitted with nstemi with PCI and discharged- then readmitted for shortness of breath in less than 12 hours from discharge thought to be mild COPD exacerbation.  Present back this time with shortness of breath due to pneumonia.  Currently on room air but will ask that home 02 assessment be performed prior to discharge home.  Independent in his adls and denies issues accessing medical care, obtaining meds or with transportation

## 2017-08-11 NOTE — Consult Note (Signed)
   [facility] CM Inpatient Consult   08/11/2017  Marcus Williams 1956/04/07 [id number]   Made aware of hospitalization by [facility] Management Telephonic RNCM. Marcus Williams was followed by [facility] for a brief period. Please see chart review tab then encounters for patient outreach details.   Spoke with Marcus Williams about [place] Management program. Marcus Williams was brief as he sounded sleepy during conversation. Discussed [facility] Care Management follow up. Marcus Williams is agreeable to [place] Management program.   Will refer to [place] due to multiple hospitalizations. History of atrial fibrillation, aortic stenosis, coronary artery disease status post bypass, chronic systolic CHF, diabetes, GERD, morbid obesity, obstructive sleep apnea, history of previous CVA, history of previous SVT, DM.  Made inpatient RNCM aware [facility] Care Management will follow.    Marthenia Rolling, MSN-Ed, RN,BSN [facility] Liaison [phone]

## 2017-08-11 NOTE — Patient Outreach (Signed)
[place] [facility]) Care Management  08/11/2017  Marcus Williams [birth year] [id number]  Per documentation; patient is currently inpatient status with admission date 08/11/2017.   Plan: [facility].   [name], RN BSN [place] Management Coordinator [facility] Care Management  [phone]

## 2017-08-11 NOTE — Progress Notes (Addendum)
MD paged to notify of patients low pressure readings: 98/62, 102/58, 99/53. Verbal orders to give 582ml bolus.  Will continue to monitor patient.   Update: 1900- patient has a hx of CHF, MD paged again to re clarify order. Will wait for MD to call back for clarification.   Update at 1920: MD called back, verbalized patient's hx of CHF, per verbal orders DO NOT give the 552ml bolus and just monitor patients blood pressure. This information was relayed to the oncoming nurse. Nursing will continue to monitor.

## 2017-08-11 NOTE — Plan of Care (Signed)
  Progressing Education: Knowledge of General Education information will improve 08/11/2017 1955 - Progressing by Marylouise Stacks, RN Health Behavior/Discharge Planning: Ability to manage health-related needs will improve 08/11/2017 1955 - Progressing by Marylouise Stacks, RN Clinical Measurements: Diagnostic test results will improve 08/11/2017 1955 - Progressing by Marylouise Stacks, RN Respiratory complications will improve 08/11/2017 1955 - Progressing by Marylouise Stacks, RN Cardiovascular complication will be avoided 08/11/2017 1955 - Progressing by Marylouise Stacks, RN Pain Managment: General experience of comfort will improve 08/11/2017 1955 - Progressing by Marylouise Stacks, RN

## 2017-08-11 NOTE — Progress Notes (Signed)
[facility] at [place] NAME: Marcus Williams    MR#:  [id number]  DATE OF BIRTH:  1955/09/20  SUBJECTIVE:  CHIEF COMPLAINT:   Chief Complaint  Patient presents with  . Nausea  . Cough  \ -Recent non-STEMI and stent placed in RCA graft 10 days ago, known history of COPD presents with worsening shortness of breath and noted to have left lower lobe pneumonia.  REVIEW OF SYSTEMS:  Review of Systems  Constitutional: Positive for fever and malaise/fatigue. Negative for chills.  HENT: Negative for congestion, ear discharge, hearing loss and nosebleeds.   Eyes: Negative for blurred vision and double vision.  Respiratory: Positive for cough, shortness of breath and wheezing.   Cardiovascular: Negative for chest pain and palpitations.  Gastrointestinal: Negative for abdominal pain, constipation, diarrhea, nausea and vomiting.  Genitourinary: Negative for dysuria and hematuria.  Musculoskeletal: Positive for myalgias.  Neurological: Negative for dizziness, speech change, focal weakness, seizures and headaches.  Psychiatric/Behavioral: Negative for depression.    DRUG ALLERGIES:  No Known Allergies  VITALS:  Blood pressure 136/68, pulse 89, temperature 97.7 F (36.5 C), temperature source Oral, resp. rate 19, height 5\' 9"  (1.753 m), weight 128.8 kg (284 lb), SpO2 96 %.  PHYSICAL EXAMINATION:  Physical Exam  GENERAL:  62 y.o.-year-old obese patient lying in the bed with no acute distress.  EYES: Pupils equal, round, reactive to light and accommodation. No scleral icterus. Extraocular muscles intact.  HEENT: Head atraumatic, normocephalic. Oropharynx and nasopharynx clear.  NECK:  Supple, no jugular venous distention. No thyroid enlargement, no tenderness.  LUNGS: Normal breath sounds bilaterally, no wheezing, rales or crepitation. No use of accessory muscles of respiration. Bibasilar rhonchi heard CARDIOVASCULAR: S1, S2 normal. No murmurs, rubs,  or gallops.  ABDOMEN: Soft, nontender, nondistended. Bowel sounds present. No organomegaly or mass.  EXTREMITIES: No pedal edema, cyanosis, or clubbing.  NEUROLOGIC: Cranial nerves II through XII are intact. Muscle strength 5/5 in all extremities. Sensation intact. Gait not checked. Global weakness noted PSYCHIATRIC: The patient is alert and oriented x 3.  SKIN: No obvious rash, lesion, or ulcer.    LABORATORY PANEL:   CBC Recent Labs  Lab 08/11/17 0520  WBC 12.4*  HGB 13.3  HCT 39.2*  PLT 175   ------------------------------------------------------------------------------------------------------------------  Chemistries  Recent Labs  Lab 08/11/17 0520  NA 135  K 3.4*  CL 100*  CO2 26  GLUCOSE 245*  BUN 23*  CREATININE 1.12  CALCIUM 7.9*   ------------------------------------------------------------------------------------------------------------------  Cardiac Enzymes Recent Labs  Lab 08/10/17 1538  TROPONINI 0.18*   ------------------------------------------------------------------------------------------------------------------  RADIOLOGY:  Dg Chest 2 View  Result Date: 08/10/2017 CLINICAL DATA:  Body aches, weakness, cough EXAM: CHEST  2 VIEW COMPARISON:  07/31/2017 FINDINGS: There is airspace disease in the superior segment of the left lower lobe. There is no other focal parenchymal opacity. There is no pleural effusion or pneumothorax. The heart and mediastinal contours are unremarkable. There is evidence of prior CABG. The osseous structures are unremarkable. IMPRESSION: Left lower lobe pneumonia. Followup PA and lateral chest X-ray is recommended in 3-4 weeks following trial of antibiotic therapy to ensure resolution and exclude underlying malignancy. Electronically Signed   By: Kathreen Devoid   On: 08/10/2017 16:18    EKG:   Orders placed or performed during the hospital encounter of 08/10/17  . ED EKG  . ED EKG  . EKG 12-Lead  . EKG 12-Lead     ASSESSMENT AND PLAN:  62 year old male with past medical history significant for A. fib, CAD with recent admission 10 days ago and had a cardiac stent, aortic stenosis, congestive heart failure, sleep apnea, diabetes, hypertension, stroke presents to hospital secondary to worsening shortness of breath, fever and cough.  1. Healthcare acquired pneumonia-recent hospital visits in the last 2 weeks. -Discontinue vancomycin as MRSA PCR is negative. Blood cultures are pending -Chest x-ray with left lower lobe infiltrate. Continue cefepime -Cough medications added -Also on incentive spirometry  2. CAD with recent non-STEMI- prior history of bypass surgery, recent RCA graft stent placement -Continue Plavix. On eliquis already. Continue this and approved, beta blocker and statin  3. COPD-stable. No indication for steroids at this time continue inhalers and nebulizers as needed  4. Chronic systolic CHF-well compensated, no exacerbation symptoms. -Continue cardiac medications  5. Diabetes mellitus-on sliding scale insulin. Also on Levemir 3 times a day -Patient also takes metformin  6. DVT prophylaxis-already on eliquis.  7. Atrial fibrillation-rate controlled. On metoprolol, Cardizem. -On eliquis for anticoagulation.     All the records are reviewed and case discussed with Care Management/Social Workerr. Management plans discussed with the patient, family and they are in agreement.  CODE STATUS: Full code  TOTAL TIME TAKING CARE OF THIS PATIENT: 39 minutes.   POSSIBLE D/C IN 1-2 DAYS, DEPENDING ON CLINICAL CONDITION.   Hanifah Royse M.D on 08/11/2017 at 1:28 PM  Between 7am to 6pm - Pager - [phone]  After 6pm go to [email or web address] - password [email or web address] [facility]  Office  [phone]  CC: Primary care physician; Idelle Crouch, MD

## 2017-08-12 LAB — BASIC METABOLIC PANEL
ANION GAP: 12 (ref 5–15)
BUN: 43 mg/dL — ABNORMAL HIGH (ref 6–20)
CO2: 22 mmol/L (ref 22–32)
Calcium: 7.8 mg/dL — ABNORMAL LOW (ref 8.9–10.3)
Chloride: 101 mmol/L (ref 101–111)
Creatinine, Ser: 2.31 mg/dL — ABNORMAL HIGH (ref 0.61–1.24)
GFR calc Af Amer: 33 mL/min — ABNORMAL LOW (ref >60)
GFR calc non Af Amer: 29 mL/min — ABNORMAL LOW (ref >60)
GLUCOSE: 112 mg/dL — AB (ref 65–99)
Potassium: 4.1 mmol/L (ref 3.5–5.1)
Sodium: 135 mmol/L (ref 135–145)

## 2017-08-12 LAB — PROCALCITONIN: Procalcitonin: 0.55 ng/mL

## 2017-08-12 LAB — GLUCOSE, CAPILLARY
GLUCOSE-CAPILLARY: 126 mg/dL — AB (ref 65–99)
GLUCOSE-CAPILLARY: 227 mg/dL — AB (ref 65–99)
Glucose-Capillary: 134 mg/dL — ABNORMAL HIGH (ref 65–99)
Glucose-Capillary: 114 mg/dL — ABNORMAL HIGH (ref 65–99)

## 2017-08-12 MED ORDER — SODIUM CHLORIDE 0.9 % IV SOLN
2.0000 g | Freq: Two times a day (BID) | INTRAVENOUS | Status: DC
  Administered 2017-08-12 – 2017-08-13 (×3): 2 g via INTRAVENOUS
  Filled 2017-08-12 (×5): qty 2

## 2017-08-12 MED ORDER — AZITHROMYCIN 250 MG PO TABS
500.0000 mg | ORAL_TABLET | Freq: Every day | ORAL | Status: DC
  Administered 2017-08-12 – 2017-08-13 (×2): 500 mg via ORAL
  Filled 2017-08-12 (×3): qty 2

## 2017-08-12 MED ORDER — ASPIRIN EC 81 MG PO TBEC
81.0000 mg | DELAYED_RELEASE_TABLET | Freq: Every day | ORAL | Status: DC
  Administered 2017-08-12 – 2017-08-14 (×3): 81 mg via ORAL
  Filled 2017-08-12 (×4): qty 1

## 2017-08-12 MED ORDER — SODIUM CHLORIDE 0.9 % IV SOLN
INTRAVENOUS | Status: DC
  Administered 2017-08-12 – 2017-08-13 (×2): via INTRAVENOUS

## 2017-08-12 MED ORDER — SODIUM CHLORIDE 0.9% FLUSH
3.0000 mL | Freq: Two times a day (BID) | INTRAVENOUS | Status: DC
  Administered 2017-08-12 – 2017-08-13 (×4): 3 mL via INTRAVENOUS

## 2017-08-12 NOTE — Consult Note (Signed)
Pharmacy Antibiotic Note  Marcus Williams is a 62 y.o. male admitted on 08/10/2017 with pneumonia.  Pharmacy has been consulted for cefepime dosing.  Plan: Decrease cefepime to 2g q 12 hr due to crcl 30-82ml/min  Height: 5\' 9"  (175.3 cm) Weight: 284 lb (128.8 kg) IBW/kg (Calculated) : 70.7  Temp (24hrs), Avg:98.4 F (36.9 C), Min:98 F (36.7 C), Max:98.8 F (37.1 C)  Recent Labs  Lab 08/10/17 1538 08/10/17 1851 08/10/17 2158 08/11/17 0520 08/12/17 0436  WBC 15.5*  --   --  12.4*  --   CREATININE 1.36*  --   --  1.12 2.31*  LATICACIDVEN  --  1.3 1.4  --   --     Estimated Creatinine Clearance: 44.6 mL/min (A) (by C-G formula based on SCr of 2.31 mg/dL (H)).    No Known Allergies  Antimicrobials this admission: vancomycin 2/25 >> 2/26 cefepime 2/25 >>   Dose adjustments this admission: Cefepime decreased from 2g q 8hr  Microbiology results: 2/25 BCx: NG   2/5 MRSA PCR: neg  Thank you for allowing pharmacy to be a part of this patient's care.  Ramond Dial, Pharm.D, BCPS Clinical Pharmacist  08/12/2017 11:49 AM

## 2017-08-12 NOTE — Care Management (Signed)
Patient is very much in agreement with home health nurse at discharge.  His wife is currently receiving home health through [facility] and would like same agency.  Referral for nursing called and accepted by [facility]

## 2017-08-12 NOTE — Progress Notes (Signed)
Patient ambulated a full circle of the unit.  He was followed closely with a wheelchair incase he got dizzy.

## 2017-08-12 NOTE — Progress Notes (Signed)
[facility] at [place] NAME: Marcus Williams    MR#:  [id number]  DATE OF BIRTH:  07/17/1955  SUBJECTIVE:  CHIEF COMPLAINT:   Chief Complaint  Patient presents with  . Nausea  . Cough  \ -Recent non-STEMI and stent placed in RCA graft, admitted with pneumonia. Feels worse. Still has dry cough and weakness. Renal function slightly worse.  REVIEW OF SYSTEMS:  Review of Systems  Constitutional: Positive for fever and malaise/fatigue. Negative for chills.  HENT: Negative for congestion, ear discharge, hearing loss and nosebleeds.   Eyes: Negative for blurred vision and double vision.  Respiratory: Positive for cough, shortness of breath and wheezing.   Cardiovascular: Negative for chest pain and palpitations.  Gastrointestinal: Negative for abdominal pain, constipation, diarrhea, nausea and vomiting.  Genitourinary: Negative for dysuria and hematuria.  Musculoskeletal: Positive for myalgias.  Neurological: Negative for dizziness, speech change, focal weakness, seizures and headaches.  Psychiatric/Behavioral: Negative for depression.    DRUG ALLERGIES:  No Known Allergies  VITALS:  Blood pressure (!) 89/51, pulse 97, temperature 98 F (36.7 C), temperature source Oral, resp. rate 18, height 5\' 9"  (1.753 m), weight 128.8 kg (284 lb), SpO2 95 %.  PHYSICAL EXAMINATION:  Physical Exam  GENERAL:  62 y.o.-year-old obese patient lying in the bed with no acute distress.  EYES: Pupils equal, round, reactive to light and accommodation. No scleral icterus. Extraocular muscles intact.  HEENT: Head atraumatic, normocephalic. Oropharynx and nasopharynx clear.  NECK:  Supple, no jugular venous distention. No thyroid enlargement, no tenderness.  LUNGS: Normal breath sounds bilaterally, minimally diminished at the bases with some rhonchi, no wheezing, rales or crepitation. No use of accessory muscles of respiration.  CARDIOVASCULAR: S1, S2 normal. No rubs,  or gallops. 3/6 systolic murmur present ABDOMEN: Soft, nontender, nondistended. Bowel sounds present. No organomegaly or mass.  EXTREMITIES: No pedal edema, cyanosis, or clubbing.  NEUROLOGIC: Cranial nerves II through XII are intact. Muscle strength 5/5 in all extremities. Sensation intact. Gait not checked. Global weakness noted PSYCHIATRIC: The patient is alert and oriented x 3.  SKIN: No obvious rash, lesion, or ulcer.    LABORATORY PANEL:   CBC Recent Labs  Lab 08/11/17 0520  WBC 12.4*  HGB 13.3  HCT 39.2*  PLT 175   ------------------------------------------------------------------------------------------------------------------  Chemistries  Recent Labs  Lab 08/12/17 0436  NA 135  K 4.1  CL 101  CO2 22  GLUCOSE 112*  BUN 43*  CREATININE 2.31*  CALCIUM 7.8*   ------------------------------------------------------------------------------------------------------------------  Cardiac Enzymes Recent Labs  Lab 08/10/17 1538  TROPONINI 0.18*   ------------------------------------------------------------------------------------------------------------------  RADIOLOGY:  Dg Chest 2 View  Result Date: 08/10/2017 CLINICAL DATA:  Body aches, weakness, cough EXAM: CHEST  2 VIEW COMPARISON:  07/31/2017 FINDINGS: There is airspace disease in the superior segment of the left lower lobe. There is no other focal parenchymal opacity. There is no pleural effusion or pneumothorax. The heart and mediastinal contours are unremarkable. There is evidence of prior CABG. The osseous structures are unremarkable. IMPRESSION: Left lower lobe pneumonia. Followup PA and lateral chest X-ray is recommended in 3-4 weeks following trial of antibiotic therapy to ensure resolution and exclude underlying malignancy. Electronically Signed   By: Kathreen Devoid   On: 08/10/2017 16:18    EKG:   Orders placed or performed during the hospital encounter of 08/10/17  . ED EKG  . ED EKG  . EKG 12-Lead    . EKG 12-Lead    ASSESSMENT  AND PLAN:   62 year old male with past medical history significant for A. fib, CAD with recent admission 10 days ago and had a cardiac stent, aortic stenosis, congestive heart failure, sleep apnea, diabetes, hypertension, stroke presents to hospital secondary to worsening shortness of breath, fever and cough.  1. Healthcare acquired pneumonia-recent hospital visit in the last 2 weeks. -Discontinued vancomycin as MRSA PCR is negative. Blood cultures are pending -Chest x-ray with left lower lobe infiltrate. On cefepime, and azithromycin has been added for atypical coverage today -Cough medications added -Also on incentive spirometry  2. CAD with recent non-STEMI- prior history of bypass surgery, recent RCA graft stent placement -Continue Plavix. On eliquis already.   3. COPD-stable. No indication for steroids at this time continue inhalers and nebulizers as needed  4. Chronic systolic CHF-well compensated, no exacerbation symptoms. -: Torsemide due to worsening renal functionContinue cardiac medications  5. Acute renal failure-could be ATN. Blood pressures have been low from his infection. Hold his blood pressure medication. -Gentle hydration today. Hold Lasix.  6. DVT prophylaxis-already on eliquis.  7. Atrial fibrillation-rate controlled. However held his metoprolol, Cardizem today for hypotension. -On eliquis for anticoagulation.   Encourage ambulation   All the records are reviewed and case discussed with Care Management/Social Workerr. Management plans discussed with the patient, family and they are in agreement.  CODE STATUS: Full code  TOTAL TIME TAKING CARE OF THIS PATIENT: 39 minutes.   POSSIBLE D/C IN 1-2 DAYS, DEPENDING ON CLINICAL CONDITION.   Gladstone Lighter M.D on 08/12/2017 at 2:50 PM  Between 7am to 6pm - Pager - [phone]  After 6pm go to [email or web address] - password [email or web address] [facility]  Office   [phone]  CC: Primary care physician; Idelle Crouch, MD

## 2017-08-13 ENCOUNTER — Inpatient Hospital Stay: Payer: Medicare HMO

## 2017-08-13 LAB — GLUCOSE, CAPILLARY
GLUCOSE-CAPILLARY: 108 mg/dL — AB (ref 65–99)
GLUCOSE-CAPILLARY: 76 mg/dL (ref 65–99)
GLUCOSE-CAPILLARY: 105 mg/dL — AB (ref 65–99)
Glucose-Capillary: 138 mg/dL — ABNORMAL HIGH (ref 65–99)

## 2017-08-13 LAB — BASIC METABOLIC PANEL
ANION GAP: 11 (ref 5–15)
BUN: 51 mg/dL — ABNORMAL HIGH (ref 6–20)
CHLORIDE: 101 mmol/L (ref 101–111)
CO2: 22 mmol/L (ref 22–32)
Calcium: 7.9 mg/dL — ABNORMAL LOW (ref 8.9–10.3)
Creatinine, Ser: 2.09 mg/dL — ABNORMAL HIGH (ref 0.61–1.24)
GFR calc Af Amer: 38 mL/min — ABNORMAL LOW (ref >60)
GFR, EST NON AFRICAN AMERICAN: 33 mL/min — AB (ref >60)
GLUCOSE: 101 mg/dL — AB (ref 65–99)
POTASSIUM: 4.3 mmol/L (ref 3.5–5.1)
SODIUM: 134 mmol/L — AB (ref 135–145)

## 2017-08-13 NOTE — Care Management (Signed)
Discussed need to complete home 02 assessment prior to discharge during progression

## 2017-08-13 NOTE — Plan of Care (Signed)
  Progressing Education: Knowledge of General Education information will improve 08/13/2017 0228 - Progressing by Marylouise Stacks, RN Health Behavior/Discharge Planning: Ability to manage health-related needs will improve 08/13/2017 0228 - Progressing by Marylouise Stacks, RN Clinical Measurements: Ability to maintain clinical measurements within normal limits will improve 08/13/2017 0228 - Progressing by Marylouise Stacks, RN Will remain free from infection 08/13/2017 0228 - Progressing by Marylouise Stacks, RN Diagnostic test results will improve 08/13/2017 0228 - Progressing by Marylouise Stacks, RN Respiratory complications will improve 08/13/2017 0228 - Progressing by Marylouise Stacks, RN Cardiovascular complication will be avoided 08/13/2017 0228 - Progressing by Marylouise Stacks, RN Activity: Risk for activity intolerance will decrease 08/13/2017 0228 - Progressing by Marylouise Stacks, RN Nutrition: Adequate nutrition will be maintained 08/13/2017 0228 - Progressing by Marylouise Stacks, RN Coping: Level of anxiety will decrease 08/13/2017 0228 - Progressing by Marylouise Stacks, RN Elimination: Will not experience complications related to bowel motility 08/13/2017 0228 - Progressing by Marylouise Stacks, RN Will not experience complications related to urinary retention 08/13/2017 0228 - Progressing by Marylouise Stacks, RN Pain Managment: General experience of comfort will improve 08/13/2017 0228 - Progressing by Marylouise Stacks, RN Safety: Ability to remain free from injury will improve 08/13/2017 0228 - Progressing by Marylouise Stacks, RN Skin Integrity: Risk for impaired skin integrity will decrease 08/13/2017 0228 - Progressing by Marylouise Stacks, RN

## 2017-08-13 NOTE — Progress Notes (Signed)
[facility] at [place] NAME: Marcus Williams    MR#:  [id number]  DATE OF BIRTH:  Nov 02, 1955  SUBJECTIVE:  CHIEF COMPLAINT:   Chief Complaint  Patient presents with  . Nausea  . Cough   - feels some better today , no fevers - BP is improved, still has myoclonic jerks.  REVIEW OF SYSTEMS:  Review of Systems  Constitutional: Positive for malaise/fatigue. Negative for chills and fever.  HENT: Negative for congestion, ear discharge, hearing loss and nosebleeds.   Eyes: Negative for blurred vision and double vision.  Respiratory: Positive for cough, shortness of breath and wheezing.   Cardiovascular: Negative for chest pain and palpitations.  Gastrointestinal: Negative for abdominal pain, constipation, diarrhea, nausea and vomiting.  Genitourinary: Negative for dysuria and hematuria.  Musculoskeletal: Positive for myalgias.  Neurological: Positive for tremors. Negative for dizziness, speech change, focal weakness, seizures and headaches.  Psychiatric/Behavioral: Negative for depression.    DRUG ALLERGIES:  No Known Allergies  VITALS:  Blood pressure 116/74, pulse (!) 49, temperature 98.4 F (36.9 C), temperature source Oral, resp. rate 18, height 5\' 9"  (1.753 m), weight 122.3 kg (269 lb 9.6 oz), SpO2 92 %.  PHYSICAL EXAMINATION:  Physical Exam  GENERAL:  62 y.o.-year-old obese patient lying in the bed with no acute distress.  EYES: Pupils equal, round, reactive to light and accommodation. No scleral icterus. Extraocular muscles intact.  HEENT: Head atraumatic, normocephalic. Oropharynx and nasopharynx clear.  NECK:  Supple, no jugular venous distention. No thyroid enlargement, no tenderness.  LUNGS: Normal breath sounds bilaterally, minimally diminished at the bases with some rhonchi, no wheezing, rales or crepitation. No use of accessory muscles of respiration.  CARDIOVASCULAR: S1, S2 normal. No rubs, or gallops. 3/6 systolic murmur  present ABDOMEN: Soft, nontender, nondistended. Bowel sounds present. No organomegaly or mass.  EXTREMITIES: No pedal edema, cyanosis, or clubbing. Some tremors of upper extremity seen- better than yesterday NEUROLOGIC: Cranial nerves II through XII are intact. Muscle strength 5/5 in all extremities. Sensation intact. Gait not checked. Global weakness noted PSYCHIATRIC: The patient is alert and oriented x 3.  SKIN: No obvious rash, lesion, or ulcer.    LABORATORY PANEL:   CBC Recent Labs  Lab 08/11/17 0520  WBC 12.4*  HGB 13.3  HCT 39.2*  PLT 175   ------------------------------------------------------------------------------------------------------------------  Chemistries  Recent Labs  Lab 08/13/17 0621  NA 134*  K 4.3  CL 101  CO2 22  GLUCOSE 101*  BUN 51*  CREATININE 2.09*  CALCIUM 7.9*   ------------------------------------------------------------------------------------------------------------------  Cardiac Enzymes Recent Labs  Lab 08/10/17 1538  TROPONINI 0.18*   ------------------------------------------------------------------------------------------------------------------  RADIOLOGY:  US Renal  Result Date: 08/13/2017 CLINICAL DATA:  62 year old male with acute renal failure EXAM: RENAL / URINARY TRACT ULTRASOUND COMPLETE COMPARISON:  03/24/2017 CT and 04/04/2016 ultrasound FINDINGS: Right Kidney: Length: 14.1 cm with mild cortical thinning. Echogenicity within normal limits. No mass or hydronephrosis visualized. Left Kidney: Length: 14.3 cm with mild cortical thinning. Echogenicity within normal limits. No mass or hydronephrosis visualized. Bladder: Appears normal for degree of bladder distention. IMPRESSION: Mild bilateral renal cortical thinning without other significant abnormality. Electronically Signed   By: Margarette Canada M.D.   On: 08/13/2017 08:53    EKG:   Orders placed or performed during the hospital encounter of 08/10/17  . ED EKG  . ED EKG    . EKG 12-Lead  . EKG 12-Lead    ASSESSMENT AND PLAN:   62 year old male  with past medical history significant for A. fib, CAD with recent admission 10 days ago and had a cardiac stent, aortic stenosis, congestive heart failure, sleep apnea, diabetes, hypertension, stroke presents to hospital secondary to worsening shortness of breath, fever and cough.  1. Healthcare acquired pneumonia-recent hospital visit in the last 2 weeks. -Discontinued vancomycin as MRSA PCR is negative. Blood cultures are negative -Chest x-ray with left lower lobe infiltrate. On cefepime, and azithromycin has been added for atypical coverage -Cough medications added, on room air -Also on incentive spirometry  2. CAD with recent non-STEMI- prior history of bypass surgery, recent RCA graft stent placement -Continue asa, Plavix. On eliquis already.   3. COPD-stable. No indication for steroids at this time continue inhalers and nebulizers as needed  4. Chronic systolic CHF-well compensated, no exacerbation symptoms. - held Torsemide due to worsening renal function - also lisinopril and metoprolol held as well due to hypotension  5. Acute renal failure-could be ATN. Blood pressures have been low from his infection. Hold his blood pressure medications- lisinopril, metoprolol and Cardizem. -Gentle hydration for 1 more day. Hold torsemide -Renal ultrasound with only minimal renal cortical thinning, no acute abnormality seen. Some improvement noted today. Continue to monitor.  6. DVT prophylaxis-already on eliquis.  7. Atrial fibrillation-rate controlled. However held his metoprolol, Cardizem for hypotension.and restart if blood pressure seems to be improving -On eliquis for anticoagulation.   Encourage ambulation,   All the records are reviewed and case discussed with Care Management/Social Workerr. Management plans discussed with the patient, family and they are in agreement.  CODE STATUS: Full code  TOTAL  TIME TAKING CARE OF THIS PATIENT: 41 minutes.    POSSIBLE D/C IN 1-2 DAYS, DEPENDING ON CLINICAL CONDITION.   Gladstone Lighter M.D on 08/13/2017 at 9:07 AM  Between 7am to 6pm - Pager - [phone]  After 6pm go to [email or web address] - password [email or web address] [facility]  Office  [phone]  CC: Primary care physician; Idelle Crouch, MD

## 2017-08-13 NOTE — Progress Notes (Signed)
SATURATION QUALIFICATIONS: (This note is used to comply with regulatory documentation for home oxygen)  Patient Saturations on Room Air at Rest = 92%  Patient Saturations on Room Air while Ambulating = 94 - 98%

## 2017-08-14 LAB — GLUCOSE, CAPILLARY
GLUCOSE-CAPILLARY: 92 mg/dL (ref 65–99)
GLUCOSE-CAPILLARY: 159 mg/dL — AB (ref 65–99)
Glucose-Capillary: 50 mg/dL — ABNORMAL LOW (ref 65–99)

## 2017-08-14 LAB — BASIC METABOLIC PANEL
ANION GAP: 9 (ref 5–15)
BUN: 34 mg/dL — ABNORMAL HIGH (ref 6–20)
CALCIUM: 8.5 mg/dL — AB (ref 8.9–10.3)
CHLORIDE: 107 mmol/L (ref 101–111)
CO2: 20 mmol/L — AB (ref 22–32)
Creatinine, Ser: 1.13 mg/dL (ref 0.61–1.24)
GFR calc Af Amer: >60 mL/min (ref >60)
GFR calc non Af Amer: >60 mL/min (ref >60)
GLUCOSE: 44 mg/dL — AB (ref 65–99)
POTASSIUM: 4.3 mmol/L (ref 3.5–5.1)
Sodium: 136 mmol/L (ref 135–145)

## 2017-08-14 MED ORDER — HYDROCOD POLST-CPM POLST ER 10-8 MG/5ML PO SUER: 5.0000 mL | Freq: Two times a day (BID) | ORAL | 0 refills | Status: DC

## 2017-08-14 MED ORDER — ASPIRIN 81 MG PO TBEC: 81.0000 mg | DELAYED_RELEASE_TABLET | Freq: Every day | ORAL | 0 refills | Status: DC

## 2017-08-14 MED ORDER — LISINOPRIL 10 MG PO TABS: 10.0000 mg | ORAL_TABLET | Freq: Every day | ORAL | 2 refills | Status: DC

## 2017-08-14 MED ORDER — AMOXICILLIN-POT CLAVULANATE 875-125 MG PO TABS: 1.0000 | ORAL_TABLET | Freq: Two times a day (BID) | ORAL | 0 refills | Status: AC

## 2017-08-14 MED ORDER — TORSEMIDE 20 MG PO TABS: 40.0000 mg | ORAL_TABLET | Freq: Every day | ORAL | 2 refills | Status: DC

## 2017-08-14 MED ORDER — AZITHROMYCIN 250 MG PO TABS: 250.0000 mg | ORAL_TABLET | Freq: Every day | ORAL | 0 refills | Status: DC

## 2017-08-14 MED ORDER — INSULIN DETEMIR 100 UNIT/ML ~~LOC~~ SOLN
50.0000 [IU] | Freq: Two times a day (BID) | SUBCUTANEOUS | Status: DC
  Administered 2017-08-14: 50 [IU] via SUBCUTANEOUS
  Filled 2017-08-14 (×2): qty 0.5

## 2017-08-14 MED ORDER — CYCLOBENZAPRINE HCL 10 MG PO TABS: 10.0000 mg | ORAL_TABLET | Freq: Two times a day (BID) | ORAL | 0 refills | Status: DC

## 2017-08-14 NOTE — Care Management (Signed)
Patient for discharge home today.  Requested the order for home health nurse.Marland Kitchen Notified [facility]

## 2017-08-14 NOTE — Progress Notes (Signed)
Discharged to home with his son.  Home care services are being set up by case management.

## 2017-08-14 NOTE — Discharge Instructions (Signed)

## 2017-08-14 NOTE — Discharge Summary (Signed)
[facility] [place] NAME: Marcus Williams    MR#:  [id number]  DATE OF BIRTH:  Sep 20, 1955  DATE OF ADMISSION:  08/10/2017   ADMITTING PHYSICIAN: Vaughan Basta, MD  DATE OF DISCHARGE: 08/14/2017 11:40 AM  PRIMARY CARE PHYSICIAN: Idelle Crouch, MD   ADMISSION DIAGNOSIS:   HCAP (healthcare-associated pneumonia) [J18.9] Sepsis, due to unspecified organism ([place]) [A41.9]  DISCHARGE DIAGNOSIS:   Principal Problem:   Pneumonia   SECONDARY DIAGNOSIS:   Past Medical History:  Diagnosis Date  . A-fib ([place])   . Aortic stenosis, moderate 11/2015  . Arthritis   . CAD (coronary artery disease)   . Chronic systolic CHF (congestive heart failure) ([place])   . Diabetes mellitus without complication ([place])    INSULIN DEPENDENT  . GERD (gastroesophageal reflux disease)   . History of cardioversion   . Hypertension   . MI (myocardial infarction) ([place])   . OSA on CPAP   . Sepsis ([place])   . Shortness of breath dyspnea   . Stroke ([place])   . SVT (supraventricular tachycardia) [facility])     HOSPITAL COURSE:   62 year old male with past medical history significant for A. fib, CAD with recent admission 10 days ago and had a cardiac stent, aortic stenosis, congestive heart failure, sleep apnea, diabetes, hypertension, stroke presents to hospital secondary to worsening shortness of breath, fever and cough.  1. Healthcare acquired pneumonia-recent hospital visit in the last 2 weeks. -Discontinued vancomycin as MRSA PCR is negative. Blood cultures are negative. Received cefepime and azithromycin in the hospital. -Chest x-ray with left lower lobe infiltrate.  -Discharged on Augmentin and azithromycin -Cough medications added, on room air -Also on incentive spirometry  2. CAD with recent non-STEMI- prior history of bypass surgery, recent RCA graft stent placement 2 weeks ago -Continue asa, Plavix. On eliquis already.   3. COPD-stable. No  indication for systemic steroids at this time continue inhalers and nebulizers as needed  4. Chronic systolic CHF-well compensated, no exacerbation symptoms. - Initially held Torsemide due to worsening renal function-restarted at a lower dose - also lisinopril (dose reduced) and metoprolol   5. Acute renal failure-could be ATN. Blood pressures have been low from his infection.  -His blood pressure medications were held, gentle hydration was given and that improved his renal function back to normal prior to discharge. -Medication doses have been adjusted at discharge -Renal ultrasound with only minimal renal cortical thinning, no acute abnormality seen.   6. Atrial fibrillation-rate controlled. On metoprolol and Cardizem -On eliquis for anticoagulation  7. Diabetes mellitus-with episode of hypoglycemia in the hospital. Due to poor oral intake according to patient. He says he checks his glucose level at home, we will resume his home doses of NPH insulin at discharge. Also on metformin. -Close outpatient follow-up recommended  Discharge with home health today    DISCHARGE CONDITIONS:   Guarded  CONSULTS OBTAINED:   None  DRUG ALLERGIES:   No Known Allergies DISCHARGE MEDICATIONS:   Allergies as of 08/14/2017   No Known Allergies     Medication List    STOP taking these medications   predniSONE 10 MG (21) Tbpk tablet Commonly known as:  STERAPRED UNI-PAK 21 TAB     TAKE these medications   albuterol 108 (90 Base) MCG/ACT inhaler Commonly known as:  PROVENTIL HFA;VENTOLIN HFA Inhale 2 puffs into the lungs every 6 (six) hours as needed for wheezing or shortness of breath.   amoxicillin-clavulanate 875-125  MG tablet Commonly known as:  AUGMENTIN Take 1 tablet by mouth 2 (two) times daily for 7 days.   apixaban 5 MG Tabs tablet Commonly known as:  ELIQUIS Take 5 mg by mouth 2 (two) times daily.   aspirin 81 MG EC tablet Take 1 tablet (81 mg total) by mouth daily.    azithromycin 250 MG tablet Commonly known as:  ZITHROMAX Take 1 tablet (250 mg total) by mouth daily. X 5 more days   baclofen 10 MG tablet Commonly known as:  LIORESAL Take 10 mg by mouth 2 (two) times daily.   chlorpheniramine-HYDROcodone 10-8 MG/5ML Suer Commonly known as:  TUSSIONEX Take 5 mLs by mouth every 12 (twelve) hours.   clopidogrel 75 MG tablet Commonly known as:  PLAVIX Take 1 tablet (75 mg total) by mouth daily with breakfast.   cyclobenzaprine 10 MG tablet Commonly known as:  FLEXERIL Take 1 tablet (10 mg total) by mouth 2 (two) times daily.   diltiazem 300 MG 24 hr capsule Commonly known as:  CARDIZEM CD Take 1 capsule (300 mg total) by mouth daily.   gabapentin 300 MG capsule Commonly known as:  NEURONTIN Take 300 mg by mouth 2 (two) times daily.   insulin NPH Human 100 UNIT/ML injection Commonly known as:  HUMULIN N,NOVOLIN N Inject 100 Units into the skin 2 (two) times daily.   ipratropium-albuterol 0.5-2.5 (3) MG/3ML Soln Commonly known as:  DUONEB Take 3 mLs by nebulization every 6 (six) hours.   lisinopril 10 MG tablet Commonly known as:  PRINIVIL,ZESTRIL Take 1 tablet (10 mg total) by mouth daily. What changed:    medication strength  how much to take   meclizine 12.5 MG tablet Commonly known as:  ANTIVERT Take 1 tablet (12.5 mg total) by mouth 3 (three) times daily as needed for dizziness.   metFORMIN 1000 MG tablet Commonly known as:  GLUCOPHAGE Take 1,000 mg by mouth 2 (two) times daily with a meal.   metoprolol tartrate 100 MG tablet Commonly known as:  LOPRESSOR Take 100 mg by mouth 2 (two) times daily.   nitroGLYCERIN 0.4 MG SL tablet Commonly known as:  NITROSTAT Place 0.4 mg under the tongue every 5 (five) minutes as needed for chest pain.   pantoprazole 40 MG tablet Commonly known as:  PROTONIX Take 40 mg by mouth daily.   Potassium Chloride ER 20 MEQ Tbcr Take 20 mEq by mouth daily.   pramipexole 0.5 MG  tablet Commonly known as:  MIRAPEX Take 0.5 mg by mouth 3 (three) times daily.   simvastatin 40 MG tablet Commonly known as:  ZOCOR Take 40 mg by mouth at bedtime.   torsemide 20 MG tablet Commonly known as:  DEMADEX Take 2 tablets (40 mg total) by mouth daily. What changed:  how much to take   traZODone 100 MG tablet Commonly known as:  DESYREL Take 100 mg by mouth at bedtime.   umeclidinium-vilanterol 62.5-25 MCG/INH Aepb Commonly known as:  ANORO ELLIPTA Inhale 1 puff into the lungs daily.        DISCHARGE INSTRUCTIONS:   1. PCP f/u in 1-2 weeks 2. Cardiology f/u  In 2 weeks  DIET:   Cardiac diet and Diabetic diet  ACTIVITY:   Activity as tolerated  OXYGEN:   Home Oxygen: No.  Oxygen Delivery: room air  DISCHARGE LOCATION:   home   If you experience worsening of your admission symptoms, develop shortness of breath, life threatening emergency, suicidal or homicidal thoughts you must  seek medical attention immediately by calling 911 or calling your MD immediately  if symptoms less severe.  You Must read complete instructions/literature along with all the possible adverse reactions/side effects for all the Medicines you take and that have been prescribed to you. Take any new Medicines after you have completely understood and accpet all the possible adverse reactions/side effects.   Please note  You were cared for by a hospitalist during your hospital stay. If you have any questions about your discharge medications or the care you received while you were in the hospital after you are discharged, you can call the unit and asked to speak with the hospitalist on call if the hospitalist that took care of you is not available. Once you are discharged, your primary care physician will handle any further medical issues. Please note that NO REFILLS for any discharge medications will be authorized once you are discharged, as it is imperative that you return to your primary  care physician (or establish a relationship with a primary care physician if you do not have one) for your aftercare needs so that they can reassess your need for medications and monitor your lab values.    On the day of Discharge:  VITAL SIGNS:   Blood pressure (!) 145/56, pulse 74, temperature 97.6 F (36.4 C), temperature source Oral, resp. rate 18, height 5\' 9"  (1.753 m), weight 123.4 kg (272 lb), SpO2 99 %.  PHYSICAL EXAMINATION:    GENERAL:  62 y.o.-year-old obese patient lying in the bed with no acute distress.  EYES: Pupils equal, round, reactive to light and accommodation. No scleral icterus. Extraocular muscles intact.  HEENT: Head atraumatic, normocephalic. Oropharynx and nasopharynx clear.  NECK:  Supple, no jugular venous distention. No thyroid enlargement, no tenderness.  LUNGS: Normal breath sounds bilaterally, minimally diminished at the bases with some rhonchi, no wheezing, rales or crepitation. No use of accessory muscles of respiration.  CARDIOVASCULAR: S1, S2 normal. No rubs, or gallops. 3/6 systolic murmur present ABDOMEN: Soft, nontender, nondistended. Bowel sounds present. No organomegaly or mass.  EXTREMITIES: No pedal edema, cyanosis, or clubbing. Some tremors of upper extremity seen- better than yesterday NEUROLOGIC: Cranial nerves II through XII are intact. Muscle strength 5/5 in all extremities. Sensation intact. Gait not checked. Global weakness noted PSYCHIATRIC: The patient is alert and oriented x 3.  SKIN: No obvious rash, lesion, or ulcer.    DATA REVIEW:   CBC Recent Labs  Lab 08/11/17 0520  WBC 12.4*  HGB 13.3  HCT 39.2*  PLT 175    Chemistries  Recent Labs  Lab 08/14/17 0441  NA 136  K 4.3  CL 107  CO2 20*  GLUCOSE 44*  BUN 34*  CREATININE 1.13  CALCIUM 8.5*     Microbiology Results  Results for orders placed or performed during the hospital encounter of 08/10/17  Blood Culture (routine x 2)     Status: None (Preliminary  result)   Collection Time: 08/10/17  7:25 PM  Result Value Ref Range Status   Specimen Description BLOOD LEFT HAND  Final   Special Requests   Final    BOTTLES DRAWN AEROBIC AND ANAEROBIC Blood Culture adequate volume   Culture   Final    NO GROWTH 4 DAYS Performed at [facility], [address], [place], [place] [address]    Report Status PENDING  Incomplete  Blood Culture (routine x 2)     Status: None (Preliminary result)   Collection Time: 08/10/17  7:33  PM  Result Value Ref Range Status   Specimen Description BLOOD LEFT ANTECUBITAL  Final   Special Requests   Final    BOTTLES DRAWN AEROBIC AND ANAEROBIC Blood Culture adequate volume   Culture   Final    NO GROWTH 4 DAYS Performed at [facility], [address], [place], [place] [address]    Report Status PENDING  Incomplete  MRSA PCR Screening     Status: None   Collection Time: 08/11/17  8:43 AM  Result Value Ref Range Status   MRSA by PCR NEGATIVE NEGATIVE Final    Comment:        The GeneXpert MRSA Assay (FDA approved for NASAL specimens only), is one component of a comprehensive MRSA colonization surveillance program. It is not intended to diagnose MRSA infection nor to guide or monitor treatment for MRSA infections. Performed at [facility], [address], [place], [place] [address]     RADIOLOGY:  No results found.   Management plans discussed with the patient, family and they are in agreement.  CODE STATUS:     Code Status Orders  (From admission, onward)        Start     Ordered   08/10/17 2206  Full code  Continuous     08/10/17 2205    Code Status History    Date Active Date Inactive Code Status Order ID Comments User Context   07/31/2017 20:45 08/02/2017 16:48 Full Code [id number]  Henreitta Leber, MD Inpatient   07/28/2017 22:30 07/31/2017 14:28 Full Code [id number]  Dustin Flock, MD Inpatient   03/01/2017 02:03 03/01/2017 20:04 Full Code [id number]   Harvie Bridge, DO Inpatient   11/21/2015 16:20 11/23/2015 17:10 Full Code [id number]  Sherren Mocha, MD Inpatient   05/06/2015 15:45 [id number] 16:09 Full Code [id number]  Sharlotte Alamo, MD Inpatient   05/04/2015 19:51 05/06/2015 15:45 Full Code [id number]  Vaughan Basta, MD ED   01/14/2015 14:16 01/15/2015 15:14 Full Code [id number]  Lance Coon, MD Inpatient   12/22/2014 18:25 12/23/2014 16:12 Full Code [id number]  Epifanio Lesches, MD ED   10/14/2014 12:14 10/15/2014 13:50 Full Code [id number]  Alfonso Patten, RN Inpatient      TOTAL TIME TAKING CARE OF THIS PATIENT: 38 minutes.    Gladstone Lighter M.D on 08/14/2017 at 2:45 PM  Between 7am to 6pm - Pager - [phone]  After 6pm go to [email or web address] - Proofreader  [facility]  Office  [phone]  CC: Primary care physician; Idelle Crouch, MD   Note: This dictation was prepared with Dragon dictation along with smaller phrase technology. Any transcriptional errors that result from this process are unintentional.

## 2017-08-14 NOTE — Plan of Care (Signed)
Feeling much better today.  Discharge with home health services

## 2017-08-14 NOTE — Progress Notes (Signed)
Inpatient Diabetes Program Recommendations  AACE/ADA: New Consensus Statement on Inpatient Glycemic Control (2015)  Target Ranges:  Prepandial:   less than 140 mg/dL      Peak postprandial:   less than 180 mg/dL (1-2 hours)      Critically ill patients:  140 - 180 mg/dL   Lab Results  Component Value Date   GLUCAP 159 (H) 08/14/2017   HGBA1C 8.0 (H) 03/01/2017    Review of Glycemic Control  Results for HARMAN, LANGHANS (MRN [id number]) as of 08/14/2017 11:19  Ref. Range 08/12/2017 21:29 08/13/2017 08:04 08/13/2017 12:06 08/13/2017 17:25 08/13/2017 21:19 08/14/2017 05:40 08/14/2017 06:47 08/14/2017 08:25  Glucose-Capillary Latest Ref Range: 65 - 99 mg/dL 114 (H) 108 (H) 138 (H) 76 105 (H) 50 (L) 92 159 (H)    Diabetes history: Type 2 Outpatient Diabetes medications: NPH 100 units bid, Glucophage 1000 mg bid  Current orders for Inpatient glycemic control:Levemir 50 units bid, Novolog 0-9 units tid  Inpatient Diabetes Program Recommendations: Agree with decrease in Levemir to 50 units bid that was changed to today.   Gentry Fitz, RN, BA, MHA, CDE Diabetes Coordinator Inpatient Diabetes Program  [phone] (Team Pager) [phone] ([place]) 08/14/2017 11:27 AM

## 2017-08-14 NOTE — Progress Notes (Signed)
Lab called critical glucose value of 44 mg/dL at 0545.  Confirmed low value with CBG, which was 50 mg/dL.  Gave patient 120 mL of orange juice, 2 packs of graham crackers, 2 peanut butters, and will recheck CBG in 15 minutes.

## 2017-08-15 LAB — CULTURE, BLOOD (ROUTINE X 2)
CULTURE: NO GROWTH
CULTURE: NO GROWTH
SPECIAL REQUESTS: ADEQUATE
Special Requests: ADEQUATE

## 2017-08-15 NOTE — Discharge Summary (Signed)
Marcus Williams, is a 62 y.o. male  DOB 12-15-1955  MRN [id number].  Admission date:  07/31/2017  Admitting Physician  Henreitta Leber, MD  Discharge Date:  08/02/2017   Primary MD  Idelle Crouch, MD  Recommendations for primary care physician for things to follow:      Admission Diagnosis  Dyspnea, unspecified type [R06.00]   Discharge Diagnosis  Dyspnea, unspecified type [R06.00]    Active Problems:   Acute respiratory failure with hypoxia [facility])      Past Medical History:  Diagnosis Date  . A-fib ([place])   . Aortic stenosis, moderate 11/2015  . Arthritis   . CAD (coronary artery disease)   . Chronic systolic CHF (congestive heart failure) ([place])   . Diabetes mellitus without complication ([place])    INSULIN DEPENDENT  . GERD (gastroesophageal reflux disease)   . History of cardioversion   . Hypertension   . MI (myocardial infarction) ([place])   . OSA on CPAP   . Sepsis ([place])   . Shortness of breath dyspnea   . Stroke ([place])   . SVT (supraventricular tachycardia) ([facility])     Past Surgical History:  Procedure Laterality Date  . 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 BUNIONECTOMY    . CARDIAC CATHETERIZATION N/A 10/02/2015   Procedure: Coronary/Grafts Angiography;  Surgeon: Teodoro Spray, MD;  Location: [place] CV LAB;  Service: Cardiovascular;  Laterality: N/A;  . CARDIAC CATHETERIZATION N/A 11/21/2015   Procedure: Right and Left Heart Cath;  Surgeon: Sherren Mocha, MD;  Location: [place] CV LAB;  Service: Cardiovascular;  Laterality: N/A;  . CORONARY ARTERY BYPASS GRAFT    . CORONARY STENT INTERVENTION N/A 07/30/2017   Procedure: CORONARY STENT INTERVENTION;  Surgeon: Isaias Cowman, MD;   Location: [place] CV LAB;  Service: Cardiovascular;  Laterality: N/A;  . KNEE SURGERY    . quadruple bypass    . RIGHT/LEFT HEART CATH AND CORONARY/GRAFT ANGIOGRAPHY N/A 07/30/2017   Procedure: RIGHT/LEFT HEART CATH AND CORONARY/GRAFT ANGIOGRAPHY;  Surgeon: Isaias Cowman, MD;  Location: [place] CV LAB;  Service: Cardiovascular;  Laterality: N/A;  . TEE WITHOUT CARDIOVERSION  11/21/2015  . TEE WITHOUT CARDIOVERSION N/A 11/21/2015   Procedure: TRANSESOPHAGEAL ECHOCARDIOGRAM (TEE);  Surgeon: Larey Dresser, MD;  Location: [place];  Service: Cardiovascular;  Laterality: N/A;       History of present illness and  Hospital Course:     Kindly see H&P for history of present illness and admission details, please review complete Labs, Consult reports and Test reports for all details in brief  HPI  from the history and physical done on the day of admission 62 year old male patient admitted for shortness of breath, cough.  Chest x-ray did not show pneumonia.  Admitted for COPD exacerbation.   Hospital Course  1 COPD exacerbation .  Clinically improved, discharged home with tapering course of steroids. 2.  Diabetes mellitus type 2: Uncontrolled due to steroids: Patient can continue Lantus, metformin at discharge, patient required higher dose of insulin during the hospital stay. 3.  Chronic atrial fibrillation: Rate controlled, continue the Cardizem 300 mg daily, Eliquis 5 mg p.o. twice daily, metoprolol 100 mg p.o. twice daily. 4.  Chronic systolic heart failure: Stable, the patient was euvolemic during hospital stay, continued on torsemide 20 mg daily, KCl 20 McCue p.o. daily.  #5 .history of CAD, recent  stent placement, continued on Plavix.  Advised to follow weight restrictions as recommended by cardiologist.  Patient wife is needing help at home as per daughter.  So I told her that patient cannot lift more than 5 pounds of weight until cleared by cardiology.   Discharge  Condition:    Follow UP  Follow-up Information    Idelle Crouch, MD Follow up in 1 week(s).   Specialty:  Internal Medicine Contact information: [place] [address] [phone]             Discharge Instructions  and  Discharge Medications      Allergies as of 08/02/2017   No Known Allergies     Medication List    TAKE these medications   albuterol 108 (90 Base) MCG/ACT inhaler Commonly known as:  PROVENTIL HFA;VENTOLIN HFA Inhale 2 puffs into the lungs every 6 (six) hours as needed for wheezing or shortness of breath.   apixaban 5 MG Tabs tablet Commonly known as:  ELIQUIS Take 5 mg by mouth 2 (two) times daily.   baclofen 10 MG tablet Commonly known as:  LIORESAL Take 10 mg by mouth 2 (two) times daily.   clopidogrel 75 MG tablet Commonly known as:  PLAVIX Take 1 tablet (75 mg total) by mouth daily with breakfast.   diltiazem 300 MG 24 hr capsule Commonly known as:  CARDIZEM CD Take 1 capsule (300 mg total) by mouth daily.   gabapentin 300 MG capsule Commonly known as:  NEURONTIN Take 300 mg by mouth 2 (two) times daily.   insulin NPH Human 100 UNIT/ML injection Commonly known as:  HUMULIN N,NOVOLIN N Inject 100 Units into the skin 2 (two) times daily.   ipratropium-albuterol 0.5-2.5 (3) MG/3ML Soln Commonly known as:  DUONEB Take 3 mLs by nebulization every 6 (six) hours.   meclizine 12.5 MG tablet Commonly known as:  ANTIVERT Take 1 tablet (12.5 mg total) by mouth 3 (three) times daily as needed for dizziness.   metFORMIN 1000 MG tablet Commonly known as:  GLUCOPHAGE Take 1,000 mg by mouth 2 (two) times daily with a meal.   metoprolol tartrate 100 MG tablet Commonly known as:  LOPRESSOR Take 100 mg by mouth 2 (two) times daily.   nitroGLYCERIN 0.4 MG SL tablet Commonly known as:  NITROSTAT Place 0.4 mg under the tongue every 5 (five) minutes as needed for chest pain.   pantoprazole 40 MG tablet Commonly  known as:  PROTONIX Take 40 mg by mouth daily.   Potassium Chloride ER 20 MEQ Tbcr Take 20 mEq by mouth daily.   pramipexole 0.5 MG tablet Commonly known as:  MIRAPEX Take 0.5 mg by mouth 3 (three) times daily.   simvastatin 40 MG tablet Commonly known as:  ZOCOR Take 40 mg by mouth at bedtime.   traZODone 100 MG tablet Commonly known as:  DESYREL Take 100 mg by mouth at bedtime.   umeclidinium-vilanterol 62.5-25 MCG/INH Aepb Commonly known as:  ANORO ELLIPTA Inhale 1 puff into the lungs daily.         Diet and Activity recommendation: See Discharge Instructions above   Consults obtained -none   Major procedures and Radiology Reports - PLEASE review detailed and final reports for all details, in brief -      Dg Chest 2 View  Result Date: 08/10/2017 CLINICAL DATA:  Body aches, weakness, cough EXAM: CHEST  2 VIEW COMPARISON:  07/31/2017 FINDINGS: There is airspace disease in the superior segment of  the left lower lobe. There is no other focal parenchymal opacity. There is no pleural effusion or pneumothorax. The heart and mediastinal contours are unremarkable. There is evidence of prior CABG. The osseous structures are unremarkable. IMPRESSION: Left lower lobe pneumonia. Followup PA and lateral chest X-ray is recommended in 3-4 weeks following trial of antibiotic therapy to ensure resolution and exclude underlying malignancy. Electronically Signed   By: Kathreen Devoid   On: 08/10/2017 16:18   Dg Chest 2 View  Result Date: 07/31/2017 CLINICAL DATA:  Shortness of breath EXAM: CHEST  2 VIEW COMPARISON:  07/28/2017 FINDINGS: The cardiopericardial silhouette is within normal limits for size. The patient is status post CABG. There is pulmonary vascular congestion without overt pulmonary edema. Atelectasis bilaterally is similar to prior. The visualized bony structures of the thorax are intact. IMPRESSION: Similar to prior study with vascular congestion but no overt pulmonary edema.  Atelectasis bilaterally. Electronically Signed   By: Misty Stanley M.D.   On: 07/31/2017 17:34   Dg Chest 2 View  Result Date: 07/28/2017 CLINICAL DATA:  62 year old male with headache and left side chest pain onset at 1700 hr today. EXAM: CHEST  2 VIEW COMPARISON:  Chest CT 03/24/2017 and earlier. FINDINGS: Stable lung volumes. Mediastinal contours remain normal. Prior CABG. Pulmonary vascular congestion appears stable to 03/24/2017 (increased from 07/19/2016) with no pleural fluid identified. Otherwise chronic increased pulmonary interstitial markings and bilateral perihilar curvilinear scarring are unchanged. No pneumothorax. No acute osseous abnormality identified. Negative visible bowel gas pattern. IMPRESSION: 1. Pulmonary vascular congestion without overt edema similar to October radiographs. 2. No pleural effusion or other acute cardiopulmonary abnormality. Electronically Signed   By: Genevie Ann M.D.   On: 07/28/2017 19:39   US Renal  Result Date: 08/13/2017 CLINICAL DATA:  62 year old male with acute renal failure EXAM: RENAL / URINARY TRACT ULTRASOUND COMPLETE COMPARISON:  03/24/2017 CT and 04/04/2016 ultrasound FINDINGS: Right Kidney: Length: 14.1 cm with mild cortical thinning. Echogenicity within normal limits. No mass or hydronephrosis visualized. Left Kidney: Length: 14.3 cm with mild cortical thinning. Echogenicity within normal limits. No mass or hydronephrosis visualized. Bladder: Appears normal for degree of bladder distention. IMPRESSION: Mild bilateral renal cortical thinning without other significant abnormality. Electronically Signed   By: Margarette Canada M.D.   On: 08/13/2017 08:53    Micro Results    Recent Results (from the past 240 hour(s))  Blood Culture (routine x 2)     Status: None   Collection Time: 08/10/17  7:25 PM  Result Value Ref Range Status   Specimen Description BLOOD LEFT HAND  Final   Special Requests   Final    BOTTLES DRAWN AEROBIC AND ANAEROBIC Blood Culture  adequate volume   Culture   Final    NO GROWTH 5 DAYS Performed at [facility], [address], [place], [place] [address]    Report Status 08/15/2017 FINAL  Final  Blood Culture (routine x 2)     Status: None   Collection Time: 08/10/17  7:33 PM  Result Value Ref Range Status   Specimen Description BLOOD LEFT ANTECUBITAL  Final   Special Requests   Final    BOTTLES DRAWN AEROBIC AND ANAEROBIC Blood Culture adequate volume   Culture   Final    NO GROWTH 5 DAYS Performed at [facility], [address], [place], [place] [address]    Report Status 08/15/2017 FINAL  Final  MRSA PCR Screening     Status: None   Collection  Time: 08/11/17  8:43 AM  Result Value Ref Range Status   MRSA by PCR NEGATIVE NEGATIVE Final    Comment:        The GeneXpert MRSA Assay (FDA approved for NASAL specimens only), is one component of a comprehensive MRSA colonization surveillance program. It is not intended to diagnose MRSA infection nor to guide or monitor treatment for MRSA infections. Performed at [facility], [address] [place], [place] [address]        Today   Subjective:   Vinicius Brockman today has no headache,no chest abdominal pain,no new weakness tingling or numbness, feels much better wants to go home today.   Objective:   Blood pressure 128/77, pulse 61, temperature 97.6 F (36.4 C), temperature source Oral, resp. rate 17, height 5\' 9"  (1.753 m), weight 128.8 kg (284 lb), SpO2 97 %.  No intake or output data in the 24 hours ending 08/15/17 1136  Exam Awake Alert, Oriented x 3, No new F.N deficits, Normal affect [place].AT,PERRAL Supple Neck,No JVD, No cervical lymphadenopathy appriciated.  Symmetrical Chest wall movement, Good air movement bilaterally, CTAB RRR,No Gallops,Rubs or new Murmurs, No Parasternal Heave +ve B.Sounds, Abd Soft, Non tender, No organomegaly appriciated, No rebound -guarding or rigidity. No Cyanosis, Clubbing or edema,  No new Rash or bruise  Data Review   CBC w Diff:  Lab Results  Component Value Date   WBC 12.4 (H) 08/11/2017   HGB 13.3 08/11/2017   HGB 11.7 (L) 10/14/2014   HCT 39.2 (L) 08/11/2017   HCT 32.9 (L) 11/22/2015   PLT 175 08/11/2017   PLT 136 (L) 10/14/2014   LYMPHOPCT 12 07/31/2017   LYMPHOPCT 8.1 07/24/2014   MONOPCT 9 07/31/2017   MONOPCT 5.2 07/24/2014   EOSPCT 2 07/31/2017   EOSPCT 0.0 07/24/2014   BASOPCT 1 07/31/2017   BASOPCT 0.2 07/24/2014    CMP:  Lab Results  Component Value Date   NA 136 08/14/2017   NA 140 10/14/2014   K 4.3 08/14/2017   K 4.0 10/14/2014   CL 107 08/14/2017   CL 110 10/14/2014   CO2 20 (L) 08/14/2017   CO2 25 10/14/2014   BUN 34 (H) 08/14/2017   BUN 18 10/14/2014   CREATININE 1.13 08/14/2017   CREATININE 1.08 11/16/2015   PROT 7.0 03/24/2017   PROT 7.1 07/23/2014   ALBUMIN 4.0 03/24/2017   ALBUMIN 2.7 (L) 07/23/2014   BILITOT 0.5 03/24/2017   BILITOT 0.9 07/23/2014   ALKPHOS 49 03/24/2017   ALKPHOS 63 07/23/2014   AST 42 (H) 03/24/2017   AST 33 07/23/2014   ALT 39 03/24/2017   ALT 44 07/23/2014  .   Total Time in preparing paper work, data evaluation and todays exam - 35 minutes  Epifanio Lesches M.D on 08/02/2017 at 11:36 AM    Note: This dictation was prepared with Dragon dictation along with smaller phrase technology. Any transcriptional errors that result from this process are unintentional.

## 2017-08-16 DIAGNOSIS — J440 Chronic obstructive pulmonary disease with acute lower respiratory infection: Secondary | ICD-10-CM | POA: Diagnosis not present

## 2017-08-16 DIAGNOSIS — I5022 Chronic systolic (congestive) heart failure: Secondary | ICD-10-CM | POA: Diagnosis not present

## 2017-08-16 DIAGNOSIS — I2510 Atherosclerotic heart disease of native coronary artery without angina pectoris: Secondary | ICD-10-CM | POA: Diagnosis not present

## 2017-08-16 DIAGNOSIS — J441 Chronic obstructive pulmonary disease with (acute) exacerbation: Secondary | ICD-10-CM | POA: Diagnosis not present

## 2017-08-16 DIAGNOSIS — I214 Non-ST elevation (NSTEMI) myocardial infarction: Secondary | ICD-10-CM | POA: Diagnosis not present

## 2017-08-16 DIAGNOSIS — I350 Nonrheumatic aortic (valve) stenosis: Secondary | ICD-10-CM | POA: Diagnosis not present

## 2017-08-16 DIAGNOSIS — E119 Type 2 diabetes mellitus without complications: Secondary | ICD-10-CM | POA: Diagnosis not present

## 2017-08-16 DIAGNOSIS — J189 Pneumonia, unspecified organism: Secondary | ICD-10-CM | POA: Diagnosis not present

## 2017-08-16 DIAGNOSIS — I110 Hypertensive heart disease with heart failure: Secondary | ICD-10-CM | POA: Diagnosis not present

## 2017-08-17 ENCOUNTER — Other Ambulatory Visit: Payer: Self-pay | Admitting: *Deleted

## 2017-08-17 ENCOUNTER — Encounter: Payer: Self-pay | Admitting: *Deleted

## 2017-08-17 NOTE — Patient Outreach (Signed)
[place] [facility]) Care Management [facility] Community CM Telephone Outreach, Transition of Care Unsuccessful call attempt #1  08/17/2017  Marcus Williams 08/03/1955 [id number]  Unsuccessful telephone outreach x 2 to [name], 62 y/o male referred to [place] for transition of care after multiple recent hospitalizations; patient had most recent hospital admission February 25- August 14, 2017 for sepsis secondary to HCAP, acute renal failure.  Patient was discharged home to self-care with home health services through [facility] in place.  Patient has history including, but not limited to, Type II DM; COPD with OSA, CPAP; HTN; CAD with recent RCA stent and previous CABG; A-Fib with previous TIA; CHF; and morbid obesity.  Prior to most recent hospitalization, patient has has additional recent hospital admissions as follows:  February 12-15, 2019: NSTEMI with stent placement February 15-17, 2019: patient returned to hospital within hours of discharge with dyspnea/ COPD exacerbation  12:40 pm:  Attempted to call patient on his stated preferred number in EMR (listed as cell number); received automated outgoing msg stating that "the person you are trying to reach has a voicemail box that has not been set up yet." unable to leave patient HIPAA compliant voice message requesting call-back  12:45 pm:  Attempted to call patient on secondary number in EMR, listed as home number; received automated outgoing VM msg stating that this phone number has been changed/ disconnected/ is no longer in service; unable to leave patient VM msg req'ing CB  Plan:  Will re-attempt telephone outreach for transition of care again tomorrow  Oneta Rack, RN, BSN, [place] Care Management  [phone]

## 2017-08-18 ENCOUNTER — Other Ambulatory Visit: Payer: Self-pay | Admitting: *Deleted

## 2017-08-18 ENCOUNTER — Encounter: Payer: Self-pay | Admitting: *Deleted

## 2017-08-18 NOTE — Patient Outreach (Signed)
[place] [facility]) Care Management [facility] Community CM Telephone Outreach Second Unsuccessful attempt/ Transition of Care  08/18/2017  KAYLIN SCHELLENBERG 1955/07/24 [id number]  Unsuccessful telephone outreach to [name], 62 y/o male referred to [place] for transition of care after multiple recent hospitalizations; patient had most recent hospital admission February 25- August 14, 2017 for sepsis secondary to HCAP, acute renal failure.  Patient was discharged home to self-care with home health services through [facility] in place.  Patient has history including, but not limited to, Type II DM; COPD with OSA, CPAP; HTN; CAD with recent RCA stent and previous CABG; A-Fib with previous TIA; CHF; and morbid obesity.  Prior to most recent hospitalization, patient has has additional recent hospital admissions as follows:  February 12-15, 2019: NSTEMI with stent placement February 15-17, 2019: patient returned to hospital within hours of discharge with dyspnea/ COPD exacerbation  10:40 pm:  Attempted to call patient on his stated preferred number in EMR (listed as cell number); received automated outgoing msg stating that "the person you are trying to reach has a voicemail box that has not been set up yet." unable to leave patient HIPAA compliant voice message requesting call-back  Plan:  If I do not hear back from patient later today, will place case closure letter in mail to patient, requesting call back by mail  Will re-attempt call to patient in 10 business days if no response back from patient prior to then; if unsuccessful third attempt is made, will proceed to case closure   Oneta Rack, RN, BSN, [place] Coordinator [facility] Care Management  [phone]

## 2017-08-21 DIAGNOSIS — I5022 Chronic systolic (congestive) heart failure: Secondary | ICD-10-CM | POA: Diagnosis not present

## 2017-08-21 DIAGNOSIS — E119 Type 2 diabetes mellitus without complications: Secondary | ICD-10-CM | POA: Diagnosis not present

## 2017-08-21 DIAGNOSIS — I214 Non-ST elevation (NSTEMI) myocardial infarction: Secondary | ICD-10-CM | POA: Diagnosis not present

## 2017-08-21 DIAGNOSIS — J440 Chronic obstructive pulmonary disease with acute lower respiratory infection: Secondary | ICD-10-CM | POA: Diagnosis not present

## 2017-08-21 DIAGNOSIS — I350 Nonrheumatic aortic (valve) stenosis: Secondary | ICD-10-CM | POA: Diagnosis not present

## 2017-08-21 DIAGNOSIS — I110 Hypertensive heart disease with heart failure: Secondary | ICD-10-CM | POA: Diagnosis not present

## 2017-08-21 DIAGNOSIS — I2510 Atherosclerotic heart disease of native coronary artery without angina pectoris: Secondary | ICD-10-CM | POA: Diagnosis not present

## 2017-08-21 DIAGNOSIS — J189 Pneumonia, unspecified organism: Secondary | ICD-10-CM | POA: Diagnosis not present

## 2017-08-21 DIAGNOSIS — J441 Chronic obstructive pulmonary disease with (acute) exacerbation: Secondary | ICD-10-CM | POA: Diagnosis not present

## 2017-08-28 DIAGNOSIS — J189 Pneumonia, unspecified organism: Secondary | ICD-10-CM | POA: Diagnosis not present

## 2017-08-28 DIAGNOSIS — I214 Non-ST elevation (NSTEMI) myocardial infarction: Secondary | ICD-10-CM | POA: Diagnosis not present

## 2017-08-28 DIAGNOSIS — I5022 Chronic systolic (congestive) heart failure: Secondary | ICD-10-CM | POA: Diagnosis not present

## 2017-08-28 DIAGNOSIS — I350 Nonrheumatic aortic (valve) stenosis: Secondary | ICD-10-CM | POA: Diagnosis not present

## 2017-08-28 DIAGNOSIS — J441 Chronic obstructive pulmonary disease with (acute) exacerbation: Secondary | ICD-10-CM | POA: Diagnosis not present

## 2017-08-28 DIAGNOSIS — I110 Hypertensive heart disease with heart failure: Secondary | ICD-10-CM | POA: Diagnosis not present

## 2017-08-28 DIAGNOSIS — I2510 Atherosclerotic heart disease of native coronary artery without angina pectoris: Secondary | ICD-10-CM | POA: Diagnosis not present

## 2017-08-28 DIAGNOSIS — E119 Type 2 diabetes mellitus without complications: Secondary | ICD-10-CM | POA: Diagnosis not present

## 2017-08-28 DIAGNOSIS — J440 Chronic obstructive pulmonary disease with acute lower respiratory infection: Secondary | ICD-10-CM | POA: Diagnosis not present

## 2017-08-31 ENCOUNTER — Other Ambulatory Visit: Payer: Self-pay | Admitting: *Deleted

## 2017-08-31 ENCOUNTER — Encounter: Payer: Self-pay | Admitting: *Deleted

## 2017-08-31 NOTE — Patient Outreach (Signed)
[place] [facility]) Care Management [place] Telephone Outreach Unsuccessful Transition of care call attempt #3  08/31/2017  HUNG RHINESMITH [birth year] [id number]  Unsuccessful telephone Marcus Williams, 62 y/o male referred to [place] for transition of care after multiple recent hospitalizations; patient had most recent hospital admission February 25- August 14, 2017 for sepsis secondary to HCAP, acute renal failure. Patient was discharged home to self-care with home health services through [facility] in place. Patient has history including, but not limited to, Type II DM; COPD with OSA, CPAP; HTN; CAD with recent RCA stent and previous CABG; A-Fib with previous TIA; CHF; and morbid obesity.  Prior to most recent hospitalization, patient has has additional recent hospital admissions as follows:  February 12-15, 2019: NSTEMI with stent placement February 15-17, 2019: patient returned to hospital within hours of discharge with dyspnea/ COPD exacerbation  2:45 pm:  Attempted to call patient on his stated preferred number in EMR (listed as cell number); received automated outgoing msg stating that "the person you are trying to reach has a voicemail box that has not been set up yet." unable to leave patient HIPAA compliant voice message requesting call-back  2:50 pm:  Attempted to call patient on secondary number in EMR, listed as home number; received automated outgoing VM msg stating that this phone number has been changed/ disconnected/ is no longer in service; unable to leave patient VM msg req'ing CB  Plan:  Will close [place] program, as I have not received call back from patient after 3 unsuccessful attempts and closure letter was sent to patient 10 business days go; will make [facility] CMA and patient's PCP aware of case closure due to inability to establish contact with patient  Oneta Rack, RN, BSN, [place]  Coordinator [facility] Care Management  [phone]

## 2017-09-03 DIAGNOSIS — I5022 Chronic systolic (congestive) heart failure: Secondary | ICD-10-CM | POA: Diagnosis not present

## 2017-09-03 DIAGNOSIS — I214 Non-ST elevation (NSTEMI) myocardial infarction: Secondary | ICD-10-CM | POA: Diagnosis not present

## 2017-09-03 DIAGNOSIS — I350 Nonrheumatic aortic (valve) stenosis: Secondary | ICD-10-CM | POA: Diagnosis not present

## 2017-09-03 DIAGNOSIS — J441 Chronic obstructive pulmonary disease with (acute) exacerbation: Secondary | ICD-10-CM | POA: Diagnosis not present

## 2017-09-03 DIAGNOSIS — I110 Hypertensive heart disease with heart failure: Secondary | ICD-10-CM | POA: Diagnosis not present

## 2017-09-03 DIAGNOSIS — J440 Chronic obstructive pulmonary disease with acute lower respiratory infection: Secondary | ICD-10-CM | POA: Diagnosis not present

## 2017-09-03 DIAGNOSIS — E119 Type 2 diabetes mellitus without complications: Secondary | ICD-10-CM | POA: Diagnosis not present

## 2017-09-03 DIAGNOSIS — J189 Pneumonia, unspecified organism: Secondary | ICD-10-CM | POA: Diagnosis not present

## 2017-09-03 DIAGNOSIS — I2510 Atherosclerotic heart disease of native coronary artery without angina pectoris: Secondary | ICD-10-CM | POA: Diagnosis not present

## 2017-09-04 DIAGNOSIS — J440 Chronic obstructive pulmonary disease with acute lower respiratory infection: Secondary | ICD-10-CM | POA: Diagnosis not present

## 2017-09-04 DIAGNOSIS — I214 Non-ST elevation (NSTEMI) myocardial infarction: Secondary | ICD-10-CM | POA: Diagnosis not present

## 2017-09-04 DIAGNOSIS — J441 Chronic obstructive pulmonary disease with (acute) exacerbation: Secondary | ICD-10-CM | POA: Diagnosis not present

## 2017-09-04 DIAGNOSIS — J189 Pneumonia, unspecified organism: Secondary | ICD-10-CM | POA: Diagnosis not present

## 2017-09-07 DIAGNOSIS — I25810 Atherosclerosis of coronary artery bypass graft(s) without angina pectoris: Secondary | ICD-10-CM | POA: Diagnosis not present

## 2017-09-07 DIAGNOSIS — G4733 Obstructive sleep apnea (adult) (pediatric): Secondary | ICD-10-CM | POA: Diagnosis not present

## 2017-09-07 DIAGNOSIS — Z0000 Encounter for general adult medical examination without abnormal findings: Secondary | ICD-10-CM | POA: Diagnosis not present

## 2017-09-07 DIAGNOSIS — Z79899 Other long term (current) drug therapy: Secondary | ICD-10-CM | POA: Diagnosis not present

## 2017-09-07 DIAGNOSIS — J4540 Moderate persistent asthma, uncomplicated: Secondary | ICD-10-CM | POA: Diagnosis not present

## 2017-09-07 DIAGNOSIS — I480 Paroxysmal atrial fibrillation: Secondary | ICD-10-CM | POA: Diagnosis not present

## 2017-09-07 DIAGNOSIS — E1143 Type 2 diabetes mellitus with diabetic autonomic (poly)neuropathy: Secondary | ICD-10-CM | POA: Diagnosis not present

## 2017-09-07 DIAGNOSIS — J189 Pneumonia, unspecified organism: Secondary | ICD-10-CM | POA: Diagnosis not present

## 2017-09-07 DIAGNOSIS — L97511 Non-pressure chronic ulcer of other part of right foot limited to breakdown of skin: Secondary | ICD-10-CM | POA: Diagnosis not present

## 2017-09-07 DIAGNOSIS — I5033 Acute on chronic diastolic (congestive) heart failure: Secondary | ICD-10-CM | POA: Diagnosis not present

## 2017-09-07 DIAGNOSIS — E1142 Type 2 diabetes mellitus with diabetic polyneuropathy: Secondary | ICD-10-CM | POA: Diagnosis not present

## 2017-09-07 DIAGNOSIS — E7800 Pure hypercholesterolemia, unspecified: Secondary | ICD-10-CM | POA: Diagnosis not present

## 2017-09-11 DIAGNOSIS — I350 Nonrheumatic aortic (valve) stenosis: Secondary | ICD-10-CM | POA: Diagnosis not present

## 2017-09-11 DIAGNOSIS — I2510 Atherosclerotic heart disease of native coronary artery without angina pectoris: Secondary | ICD-10-CM | POA: Diagnosis not present

## 2017-09-11 DIAGNOSIS — I5022 Chronic systolic (congestive) heart failure: Secondary | ICD-10-CM | POA: Diagnosis not present

## 2017-09-11 DIAGNOSIS — I214 Non-ST elevation (NSTEMI) myocardial infarction: Secondary | ICD-10-CM | POA: Diagnosis not present

## 2017-09-11 DIAGNOSIS — J189 Pneumonia, unspecified organism: Secondary | ICD-10-CM | POA: Diagnosis not present

## 2017-09-11 DIAGNOSIS — J441 Chronic obstructive pulmonary disease with (acute) exacerbation: Secondary | ICD-10-CM | POA: Diagnosis not present

## 2017-09-11 DIAGNOSIS — E119 Type 2 diabetes mellitus without complications: Secondary | ICD-10-CM | POA: Diagnosis not present

## 2017-09-11 DIAGNOSIS — J440 Chronic obstructive pulmonary disease with acute lower respiratory infection: Secondary | ICD-10-CM | POA: Diagnosis not present

## 2017-09-11 DIAGNOSIS — I110 Hypertensive heart disease with heart failure: Secondary | ICD-10-CM | POA: Diagnosis not present

## 2017-09-15 DIAGNOSIS — I350 Nonrheumatic aortic (valve) stenosis: Secondary | ICD-10-CM | POA: Diagnosis not present

## 2017-09-15 DIAGNOSIS — I10 Essential (primary) hypertension: Secondary | ICD-10-CM | POA: Diagnosis not present

## 2017-09-15 DIAGNOSIS — I480 Paroxysmal atrial fibrillation: Secondary | ICD-10-CM | POA: Diagnosis not present

## 2017-09-15 DIAGNOSIS — E7800 Pure hypercholesterolemia, unspecified: Secondary | ICD-10-CM | POA: Diagnosis not present

## 2017-09-15 DIAGNOSIS — I63541 Cerebral infarction due to unspecified occlusion or stenosis of right cerebellar artery: Secondary | ICD-10-CM | POA: Diagnosis not present

## 2017-09-15 DIAGNOSIS — I25810 Atherosclerosis of coronary artery bypass graft(s) without angina pectoris: Secondary | ICD-10-CM | POA: Diagnosis not present

## 2017-09-29 ENCOUNTER — Other Ambulatory Visit: Payer: Self-pay | Admitting: Internal Medicine

## 2017-09-29 DIAGNOSIS — E7800 Pure hypercholesterolemia, unspecified: Secondary | ICD-10-CM | POA: Diagnosis not present

## 2017-09-29 DIAGNOSIS — R197 Diarrhea, unspecified: Secondary | ICD-10-CM | POA: Diagnosis not present

## 2017-09-29 DIAGNOSIS — E1143 Type 2 diabetes mellitus with diabetic autonomic (poly)neuropathy: Secondary | ICD-10-CM | POA: Diagnosis not present

## 2017-09-29 DIAGNOSIS — I25810 Atherosclerosis of coronary artery bypass graft(s) without angina pectoris: Secondary | ICD-10-CM | POA: Diagnosis not present

## 2017-09-29 DIAGNOSIS — I10 Essential (primary) hypertension: Secondary | ICD-10-CM | POA: Diagnosis not present

## 2017-09-29 DIAGNOSIS — Z79899 Other long term (current) drug therapy: Secondary | ICD-10-CM | POA: Diagnosis not present

## 2017-09-29 DIAGNOSIS — Z9989 Dependence on other enabling machines and devices: Secondary | ICD-10-CM | POA: Diagnosis not present

## 2017-09-29 DIAGNOSIS — G4733 Obstructive sleep apnea (adult) (pediatric): Secondary | ICD-10-CM | POA: Diagnosis not present

## 2017-10-07 ENCOUNTER — Ambulatory Visit
Admission: RE | Admit: 2017-10-07 | Discharge: 2017-10-07 | Disposition: A | Discharge status: Home / Self Care | Payer: Medicare HMO | Source: Ambulatory Visit | Attending: Internal Medicine | Admitting: Internal Medicine

## 2017-10-07 ENCOUNTER — Ambulatory Visit: Payer: Medicare HMO

## 2017-10-07 DIAGNOSIS — R197 Diarrhea, unspecified: Secondary | ICD-10-CM | POA: Insufficient documentation

## 2017-10-08 DIAGNOSIS — L97511 Non-pressure chronic ulcer of other part of right foot limited to breakdown of skin: Secondary | ICD-10-CM | POA: Diagnosis not present

## 2017-10-08 DIAGNOSIS — E1143 Type 2 diabetes mellitus with diabetic autonomic (poly)neuropathy: Secondary | ICD-10-CM | POA: Diagnosis not present

## 2017-10-08 DIAGNOSIS — L851 Acquired keratosis [keratoderma] palmaris et plantaris: Secondary | ICD-10-CM | POA: Diagnosis not present

## 2017-10-08 DIAGNOSIS — Z79899 Other long term (current) drug therapy: Secondary | ICD-10-CM | POA: Diagnosis not present

## 2017-10-08 DIAGNOSIS — E1159 Type 2 diabetes mellitus with other circulatory complications: Secondary | ICD-10-CM | POA: Diagnosis not present

## 2017-10-08 DIAGNOSIS — E113293 Type 2 diabetes mellitus with mild nonproliferative diabetic retinopathy without macular edema, bilateral: Secondary | ICD-10-CM | POA: Diagnosis not present

## 2017-10-08 DIAGNOSIS — B351 Tinea unguium: Secondary | ICD-10-CM | POA: Diagnosis not present

## 2017-10-08 DIAGNOSIS — E1142 Type 2 diabetes mellitus with diabetic polyneuropathy: Secondary | ICD-10-CM | POA: Diagnosis not present

## 2017-10-08 DIAGNOSIS — E1165 Type 2 diabetes mellitus with hyperglycemia: Secondary | ICD-10-CM | POA: Diagnosis not present

## 2017-10-08 DIAGNOSIS — Z89421 Acquired absence of other right toe(s): Secondary | ICD-10-CM | POA: Diagnosis not present

## 2017-10-08 DIAGNOSIS — Z794 Long term (current) use of insulin: Secondary | ICD-10-CM | POA: Diagnosis not present

## 2017-10-08 DIAGNOSIS — E1140 Type 2 diabetes mellitus with diabetic neuropathy, unspecified: Secondary | ICD-10-CM | POA: Diagnosis not present

## 2017-10-08 DIAGNOSIS — I10 Essential (primary) hypertension: Secondary | ICD-10-CM | POA: Diagnosis not present

## 2017-10-21 ENCOUNTER — Other Ambulatory Visit: Payer: Self-pay

## 2017-10-21 ENCOUNTER — Encounter: Payer: Self-pay | Admitting: Emergency Medicine

## 2017-10-21 ENCOUNTER — Emergency Department: Payer: Medicare HMO

## 2017-10-21 ENCOUNTER — Inpatient Hospital Stay
Admission: EM | Admit: 2017-10-21 | Discharge: 2017-10-24 | DRG: 247 | Disposition: A | Discharge status: Home / Self Care | Payer: Medicare HMO | Attending: Internal Medicine | Admitting: Internal Medicine

## 2017-10-21 DIAGNOSIS — G8929 Other chronic pain: Secondary | ICD-10-CM | POA: Diagnosis present

## 2017-10-21 DIAGNOSIS — Z8673 Personal history of transient ischemic attack (TIA), and cerebral infarction without residual deficits: Secondary | ICD-10-CM

## 2017-10-21 DIAGNOSIS — I214 Non-ST elevation (NSTEMI) myocardial infarction: Secondary | ICD-10-CM | POA: Diagnosis present

## 2017-10-21 DIAGNOSIS — M545 Low back pain: Secondary | ICD-10-CM | POA: Diagnosis present

## 2017-10-21 DIAGNOSIS — Z9989 Dependence on other enabling machines and devices: Secondary | ICD-10-CM | POA: Diagnosis not present

## 2017-10-21 DIAGNOSIS — G4733 Obstructive sleep apnea (adult) (pediatric): Secondary | ICD-10-CM | POA: Diagnosis present

## 2017-10-21 DIAGNOSIS — E1159 Type 2 diabetes mellitus with other circulatory complications: Secondary | ICD-10-CM | POA: Diagnosis not present

## 2017-10-21 DIAGNOSIS — K219 Gastro-esophageal reflux disease without esophagitis: Secondary | ICD-10-CM | POA: Diagnosis present

## 2017-10-21 DIAGNOSIS — I350 Nonrheumatic aortic (valve) stenosis: Secondary | ICD-10-CM | POA: Diagnosis present

## 2017-10-21 DIAGNOSIS — E1165 Type 2 diabetes mellitus with hyperglycemia: Secondary | ICD-10-CM | POA: Diagnosis present

## 2017-10-21 DIAGNOSIS — R748 Abnormal levels of other serum enzymes: Secondary | ICD-10-CM | POA: Diagnosis not present

## 2017-10-21 DIAGNOSIS — Z794 Long term (current) use of insulin: Secondary | ICD-10-CM

## 2017-10-21 DIAGNOSIS — Z6841 Body Mass Index (BMI) 40.0 and over, adult: Secondary | ICD-10-CM | POA: Diagnosis not present

## 2017-10-21 DIAGNOSIS — Z79899 Other long term (current) drug therapy: Secondary | ICD-10-CM | POA: Diagnosis not present

## 2017-10-21 DIAGNOSIS — Z951 Presence of aortocoronary bypass graft: Secondary | ICD-10-CM

## 2017-10-21 DIAGNOSIS — I200 Unstable angina: Secondary | ICD-10-CM | POA: Diagnosis not present

## 2017-10-21 DIAGNOSIS — I5022 Chronic systolic (congestive) heart failure: Secondary | ICD-10-CM | POA: Diagnosis present

## 2017-10-21 DIAGNOSIS — Z8249 Family history of ischemic heart disease and other diseases of the circulatory system: Secondary | ICD-10-CM

## 2017-10-21 DIAGNOSIS — I480 Paroxysmal atrial fibrillation: Secondary | ICD-10-CM | POA: Diagnosis present

## 2017-10-21 DIAGNOSIS — I25790 Atherosclerosis of other coronary artery bypass graft(s) with unstable angina pectoris: Secondary | ICD-10-CM | POA: Diagnosis not present

## 2017-10-21 DIAGNOSIS — I25119 Atherosclerotic heart disease of native coronary artery with unspecified angina pectoris: Secondary | ICD-10-CM | POA: Diagnosis present

## 2017-10-21 DIAGNOSIS — J449 Chronic obstructive pulmonary disease, unspecified: Secondary | ICD-10-CM | POA: Diagnosis present

## 2017-10-21 DIAGNOSIS — I252 Old myocardial infarction: Secondary | ICD-10-CM

## 2017-10-21 DIAGNOSIS — D638 Anemia in other chronic diseases classified elsewhere: Secondary | ICD-10-CM | POA: Diagnosis present

## 2017-10-21 DIAGNOSIS — I110 Hypertensive heart disease with heart failure: Secondary | ICD-10-CM | POA: Diagnosis present

## 2017-10-21 DIAGNOSIS — Z89421 Acquired absence of other right toe(s): Secondary | ICD-10-CM

## 2017-10-21 DIAGNOSIS — D649 Anemia, unspecified: Secondary | ICD-10-CM | POA: Diagnosis not present

## 2017-10-21 DIAGNOSIS — Z955 Presence of coronary angioplasty implant and graft: Secondary | ICD-10-CM | POA: Diagnosis not present

## 2017-10-21 DIAGNOSIS — Z7901 Long term (current) use of anticoagulants: Secondary | ICD-10-CM

## 2017-10-21 DIAGNOSIS — R079 Chest pain, unspecified: Secondary | ICD-10-CM | POA: Diagnosis not present

## 2017-10-21 DIAGNOSIS — I2510 Atherosclerotic heart disease of native coronary artery without angina pectoris: Secondary | ICD-10-CM | POA: Diagnosis not present

## 2017-10-21 DIAGNOSIS — J40 Bronchitis, not specified as acute or chronic: Secondary | ICD-10-CM | POA: Diagnosis not present

## 2017-10-21 DIAGNOSIS — Z7902 Long term (current) use of antithrombotics/antiplatelets: Secondary | ICD-10-CM

## 2017-10-21 DIAGNOSIS — E782 Mixed hyperlipidemia: Secondary | ICD-10-CM | POA: Diagnosis present

## 2017-10-21 DIAGNOSIS — Z7952 Long term (current) use of systemic steroids: Secondary | ICD-10-CM

## 2017-10-21 MED ORDER — ONDANSETRON HCL 4 MG/2ML IJ SOLN
4.0000 mg | Freq: Once | INTRAMUSCULAR | Status: AC
  Administered 2017-10-22: 4 mg via INTRAVENOUS
  Filled 2017-10-21: qty 2

## 2017-10-21 MED ORDER — MORPHINE SULFATE (PF) 4 MG/ML IV SOLN
4.0000 mg | Freq: Once | INTRAVENOUS | Status: AC
  Administered 2017-10-22: 4 mg via INTRAVENOUS
  Filled 2017-10-21: qty 1

## 2017-10-21 MED ORDER — FAMOTIDINE IN NACL 20-0.9 MG/50ML-% IV SOLN
20.0000 mg | Freq: Once | INTRAVENOUS | Status: AC
  Administered 2017-10-22: 20 mg via INTRAVENOUS
  Filled 2017-10-21: qty 50

## 2017-10-21 MED ORDER — GI COCKTAIL ~~LOC~~
30.0000 mL | Freq: Once | ORAL | Status: AC
  Administered 2017-10-22: 30 mL via ORAL
  Filled 2017-10-21: qty 30

## 2017-10-21 NOTE — ED Triage Notes (Addendum)
Pt arrives POV to triage with c/o chest pain since around 1830. Pt states that he took nitro x 2 with no relief. Pt reports that he had three stents placed last month and is on eliquis at this time. Pt states pain is on the left side of chest and radiating down the left arm.

## 2017-10-21 NOTE — ED Provider Notes (Signed)
[facility] Emergency Department Provider Note  ____________________________________________   First MD Initiated Contact with Patient 10/21/17 2326     (approximate)  I have reviewed the triage vital signs and the nursing notes.   HISTORY  Chief Complaint Chest Pain   HPI Marcus Williams is a 62 y.o. male self presents to the emergency department with atypical chest pain.  His pain came on gradually and in his left upper lateral chest.  It is sharp.  Nonradiating.  Does not feel like his previous myocardial infarction.  No shortness of breath.  Does not seem to be positional.  He initially thought he might have "heartburn".  The pain is nonexertional.  He has no history of DVT or pulmonary embolism.  He has a complex past medical history including previous myocardial infarctions, stroke, atrial fibrillation, morbid obesity, hypertension, CHF, and diabetes mellitus.  Past Medical History:  Diagnosis Date  . A-fib ([place])   . Aortic stenosis, moderate 11/2015  . Arthritis   . CAD (coronary artery disease)   . Chronic systolic CHF (congestive heart failure) ([place])   . Diabetes mellitus without complication ([place])    INSULIN DEPENDENT  . GERD (gastroesophageal reflux disease)   . History of cardioversion   . Hypertension   . MI (myocardial infarction) ([place])   . OSA on CPAP   . Sepsis ([place])   . Shortness of breath dyspnea   . Stroke ([place])   . SVT (supraventricular tachycardia) [facility])     Patient Active Problem List   Diagnosis Date Noted  . Pneumonia 08/10/2017  . NSTEMI (non-ST elevated myocardial infarction) ([place]) 07/28/2017  . TIA (transient ischemic attack) 03/01/2017  . Aortic stenosis 11/21/2015  . Acute on chronic diastolic CHF (congestive heart failure), NYHA class 3 ([place]) 11/21/2015  . Aortic valve, bicuspid 09/24/2015  . Cellulitis 05/04/2015  . Acute respiratory failure with hypoxia ([place]) 03/26/2015  . CHF (congestive heart failure) ([place])  01/15/2015  . HTN (hypertension) 01/14/2015  . Type II diabetes mellitus ([place]) 01/14/2015  . COPD (chronic obstructive pulmonary disease) ([place]) 01/14/2015  . OSA on CPAP 01/14/2015  . CAD (coronary artery disease) 01/14/2015  . A-fib ([place]) 01/14/2015  . Pain of left calf 01/14/2015  . Acute on chronic systolic CHF (congestive heart failure) ([place]) 01/14/2015  . COPD exacerbation ([place]) 12/22/2014  . Diabetes ([place]) 10/14/2014    Past Surgical History:  Procedure Laterality Date  . 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 BUNIONECTOMY    . CARDIAC CATHETERIZATION N/A 10/02/2015   Procedure: Coronary/G[place]s Angiography;  Surgeon: Teodoro Spray, MD;  Location: [place] [facility];  Service: Cardiovascular;  Laterality: N/A;  . CARDIAC CATHETERIZATION N/A 11/21/2015   Procedure: Right and Left Heart Cath;  Surgeon: Sherren Mocha, MD;  Location: [place] [facility];  Service: Cardiovascular;  Laterality: N/A;  . CORONARY ARTERY BYPASS GRAFT    . CORONARY STENT INTERVENTION N/A 07/30/2017   Procedure: CORONARY STENT INTERVENTION;  Surgeon: Isaias Cowman, MD;  Location: [place] [facility];  Service: Cardiovascular;  Laterality: N/A;  . CORONARY STENT INTERVENTION N/A 10/23/2017   Procedure: CORONARY STENT INTERVENTION;  Surgeon: Yolonda Kida, MD;  Location: [place] [facility];  Service: Cardiovascular;  Laterality: N/A;  . KNEE SURGERY    . LEFT HEART CATH AND CORS/GRAFTS ANGIOGRAPHY N/A 10/22/2017   Procedure: LEFT HEART CATH AND  CORS/GRAFTS ANGIOGRAPHY;  Surgeon: Corey Skains, MD;  Location: [place] [facility];  Service: Cardiovascular;  Laterality: N/A;  . quadruple bypass    . RIGHT/LEFT HEART CATH AND CORONARY/GRAFT ANGIOGRAPHY N/A 07/30/2017   Procedure: RIGHT/LEFT HEART CATH AND CORONARY/GRAFT ANGIOGRAPHY;  Surgeon: Isaias Cowman, MD;  Location: [place] [facility];  Service: Cardiovascular;  Laterality: N/A;  . TEE WITHOUT CARDIOVERSION  11/21/2015  . TEE WITHOUT CARDIOVERSION N/A 11/21/2015   Procedure: TRANSESOPHAGEAL ECHOCARDIOGRAM (TEE);  Surgeon: Larey Dresser, MD;  Location: [place];  Service: Cardiovascular;  Laterality: N/A;    Prior to Admission medications   Medication Sig Start Date End Date Taking? Authorizing Provider  albuterol (PROVENTIL HFA;VENTOLIN HFA) 108 (90 Base) MCG/ACT inhaler Inhale 2 puffs into the lungs every 6 (six) hours as needed for wheezing or shortness of breath. 07/11/15  Yes Kasa, Maretta Bees, MD  amoxicillin (AMOXIL) 875 MG tablet Take 875 mg by mouth 2 (two) times daily. 10/22/17  Yes [provider]  apixaban (ELIQUIS) 5 MG TABS tablet Take 5 mg by mouth 2 (two) times daily.   Yes [provider]  baclofen (LIORESAL) 10 MG tablet Take 10 mg by mouth 2 (two) times daily as needed for muscle spasms.    Yes [provider]  clopidogrel (PLAVIX) 75 MG tablet Take 1 tablet (75 mg total) by mouth daily with breakfast. 08/01/17  Yes Max Sane, MD  diltiazem (CARDIZEM CD) 180 MG 24 hr capsule Take 180 mg by mouth 2 (two) times daily.   Yes [provider]  DULoxetine (CYMBALTA) 20 MG capsule Take 20 mg by mouth daily.   Yes [provider]  Fluticasone-Salmeterol (ADVAIR) 250-50 MCG/DOSE AEPB Inhale 1 puff into the lungs 2 (two) times daily.   Yes [provider]  insulin NPH-regular Human (NOVOLIN 70/30) (70-30) 100 UNIT/ML injection Inject 85-100 Units into the skin 2 (two) times daily with a meal. 85 units in the morning and 100 units in the evening   Yes [provider]  lisinopril (PRINIVIL,ZESTRIL) 10 MG tablet Take 1 tablet (10 mg total) by mouth daily. 08/14/17  Yes [place]stone Lighter, MD  metFORMIN (GLUCOPHAGE) 1000 MG tablet Take 1,000 mg by mouth 2 (two) times daily with a meal.   Yes [provider]  metoprolol (LOPRESSOR) 100 MG  tablet Take 100 mg by mouth 2 (two) times daily. 10/15/15  Yes [provider]  nitroGLYCERIN (NITROSTAT) 0.4 MG SL tablet Place 0.4 mg under the tongue every 5 (five) minutes as needed for chest pain.   Yes [provider]  pantoprazole (PROTONIX) 40 MG tablet Take 40 mg by mouth daily.   Yes [provider]  potassium chloride 20 MEQ TBCR Take 20 mEq by mouth daily. 11/23/15  Yes Shirley Friar, PA-C  pramipexole (MIRAPEX) 0.5 MG tablet Take 0.5 mg by mouth 3 (three) times daily as needed.    Yes [provider]  predniSONE (DELTASONE) 10 MG tablet Take 10-40 mg by mouth daily with breakfast. 40 mg x2 days, 30 mg x2 day, 20 mg x2 days, 10 mg x2 days 10/22/17  Yes [provider]  torsemide (DEMADEX) 20 MG tablet Take 2 tablets (40 mg total) by mouth daily. 08/14/17  Yes [place] Lighter, MD  acetaminophen (TYLENOL) 325 MG tablet Take 2 tablets (650 mg total) by mouth every 6 (six) hours as needed for mild pain or headache. 10/24/17   Gouru, Illene Silver, MD  atorvastatin (LIPITOR) 40 MG tablet Take 1  tablet (40 mg total) by mouth daily at 6 PM. 10/24/17   [name], MD    Allergies Patient has no known allergies.  Family History  Problem Relation Age of Onset  . Stroke Mother   . Heart attack Mother   . Hypertension Mother   . Heart attack Father   . Hypertension Father   . Heart attack Brother        #1  . Diabetes Brother        #1  . Heart disease Brother        #2  . Lung disease Brother        #2  . Hypertension Brother        #2  . Diabetes Brother        #2    Social History Social History   Tobacco Use  . Smoking status: Never Smoker  . Smokeless tobacco: Never Used  Substance Use Topics  . Alcohol use: No  . Drug use: No    Review of Systems Constitutional: No fever/chills Eyes: No visual changes. ENT: No sore throat. Cardiovascular: Positive for chest pain. Respiratory: Positive for shortness of  breath. Gastrointestinal: No abdominal pain.  No nausea, no vomiting.  No diarrhea.  No constipation. Genitourinary: Negative for dysuria. Musculoskeletal: Negative for back pain. Skin: Negative for rash. Neurological: Negative for headaches, focal weakness or numbness.   ____________________________________________   PHYSICAL EXAM:  VITAL SIGNS: ED Triage Vitals  Enc Vitals Group     BP 10/21/17 2322 (!) 160/72     Pulse Rate 10/21/17 2322 80     Resp 10/21/17 2322 18     Temp 10/21/17 2322 98.2 F (36.8 C)     Temp Source 10/21/17 2322 Oral     SpO2 10/21/17 2322 96 %     Weight 10/21/17 2325 282 lb (127.9 kg)     Height 10/21/17 2325 5\' 9"  (1.753 m)     Head Circumference --      Peak Flow --      Pain Score 10/21/17 2325 10     Pain Loc --      Pain Edu? --      Excl. in [place]? --     Constitutional: Alert and oriented x4 appears somewhat uncomfortable morbidly obese with elevated respiratory rate Eyes: PERRL EOMI. Head: Atraumatic. Nose: No congestion/rhinnorhea. Mouth/Throat: No trismus Neck: No stridor.  Unable to lie completely flat Cardiovascular: Normal rate, regular rhythm. Grossly normal heart sounds.  Good peripheral circulation. Respiratory: Normal respiratory effort.  No retractions. Lungs CTAB and moving good air Gastrointestinal: Obese soft nontender Musculoskeletal: Legs equal in size with bilateral pitting edema Neurologic:  Normal speech and language. No gross focal neurologic deficits are appreciated. Skin:  Skin is warm, dry and intact. No rash noted. Psychiatric: Mood and affect are normal. Speech and behavior are normal.    ____________________________________________   DIFFERENTIAL includes but not limited to  Acute coronary syndrome, CHF exacerbation, pulmonary embolism, pneumothorax ____________________________________________   LABS (all labs ordered are listed, but only abnormal results are displayed)  Labs Reviewed  BASIC  METABOLIC PANEL - Abnormal; Notable for the following components:      Result Value   Glucose, Bld 333 (*)    BUN 22 (*)    Calcium 8.5 (*)    All other components within normal limits  CBC - Abnormal; Notable for the following components:   RBC 4.06 (*)    Hemoglobin 12.3 (*)  HCT 35.8 (*)    RDW 14.9 (*)    Platelets 148 (*)    All other components within normal limits  TROPONIN I - Abnormal; Notable for the following components:   Troponin I 0.07 (*)    All other components within normal limits  BRAIN NATRIURETIC PEPTIDE - Abnormal; Notable for the following components:   B Natriuretic Peptide 278.0 (*)    All other components within normal limits  HEPATIC FUNCTION PANEL - Abnormal; Notable for the following components:   Bilirubin, Direct <0.1 (*)    All other components within normal limits  TROPONIN I - Abnormal; Notable for the following components:   Troponin I 0.47 (*)    All other components within normal limits  GLUCOSE, CAPILLARY - Abnormal; Notable for the following components:   Glucose-Capillary 230 (*)    All other components within normal limits  GLUCOSE, CAPILLARY - Abnormal; Notable for the following components:   Glucose-Capillary 191 (*)    All other components within normal limits  TROPONIN I - Abnormal; Notable for the following components:   Troponin I 1.63 (*)    All other components within normal limits  APTT - Abnormal; Notable for the following components:   aPTT 51 (*)    All other components within normal limits  CBC - Abnormal; Notable for the following components:   RBC 3.99 (*)    Hemoglobin 12.0 (*)    HCT 35.3 (*)    Platelets 139 (*)    All other components within normal limits  GLUCOSE, CAPILLARY - Abnormal; Notable for the following components:   Glucose-Capillary 262 (*)    All other components within normal limits  GLUCOSE, CAPILLARY - Abnormal; Notable for the following components:   Glucose-Capillary 237 (*)    All other  components within normal limits  GLUCOSE, CAPILLARY - Abnormal; Notable for the following components:   Glucose-Capillary 204 (*)    All other components within normal limits  GLUCOSE, CAPILLARY - Abnormal; Notable for the following components:   Glucose-Capillary 245 (*)    All other components within normal limits  GLUCOSE, CAPILLARY - Abnormal; Notable for the following components:   Glucose-Capillary 205 (*)    All other components within normal limits  GLUCOSE, CAPILLARY - Abnormal; Notable for the following components:   Glucose-Capillary 243 (*)    All other components within normal limits  GLUCOSE, CAPILLARY - Abnormal; Notable for the following components:   Glucose-Capillary 210 (*)    All other components within normal limits  HEPARIN LEVEL (UNFRACTIONATED)  POCT ACTIVATED CLOTTING TIME    Lab work reviewed by me with rising troponin concerning for acute coronary syndrome __________________________________________  EKG  ED ECG REPORT I, Darel Hong, the attending physician, personally viewed and interpreted this ECG.  Date: 10/21/2017 EKG Time:  Rate: 82 Rhythm: normal sinus rhythm QRS Axis: normal Intervals: normal ST/T Wave abnormalities: ST elevation in aVR along with depression and T wave inversion in high lateral leads and diffusely is concerning for global process Narrative Interpretation: Concerning for acute ischemia although the EKG pattern is similar to previous EKG July 31, 2017   ____________________________________________  RADIOLOGY  Chest x-ray reviewed by me with no acute disease ____________________________________________   PROCEDURES  Procedure(s) performed: no  .Critical Care Performed by: Darel Hong, MD Authorized by: Darel Hong, MD   Critical care provider statement:    Critical care time (minutes):  30   Critical care time was exclusive of:  Separately billable procedures  and treating other patients   Critical  care was necessary to treat or prevent imminent or life-threatening deterioration of the following conditions:  Cardiac failure   Critical care was time spent personally by me on the following activities:  Development of treatment plan with patient or surrogate, discussions with consultants, evaluation of patient's response to treatment, examination of patient, obtaining history from patient or surrogate, ordering and performing treatments and interventions, ordering and review of laboratory studies, ordering and review of radiographic studies, pulse oximetry, re-evaluation of patient's condition and review of old charts    Critical Care performed: yes  Observation: no ____________________________________________   INITIAL IMPRESSION / ASSESSMENT AND PLAN / ED COURSE  Pertinent labs & imaging results that were available during my care of the patient were reviewed by me and considered in my medical decision making (see chart for details).  The patient's EKG is abnormal although unchanged from previous EKGs.  His pain does not feel like when he had a heart attack and it sounds somewhat atypical.  Regardless he has an extensive cardiac history and he will require at least 2 troponins.  First troponin is back slightly elevated although with roughly normal renal function.  This could be secondary to stretch so we will give him an extra dose of Lasix now while second troponin is pending.  The patient's second troponin has climbed significantly and at this point I am uncomfortable letting him go home as this very well could be a non-STEMI.  He declines aspirin as he is already anticoagulated.  I discussed with the hospitalist who has graciously agreed to admit the patient to his service.  We would give him heparin however he is already taking Eliquis.      ____________________________________________   FINAL CLINICAL IMPRESSION(S) / ED DIAGNOSES  Final diagnoses:  NSTEMI (non-ST elevated  myocardial infarction) ([place])      NEW MEDICATIONS STARTED DURING THIS VISIT:  Discharge Medication List as of 10/24/2017  1:48 PM    START taking these medications   Details  acetaminophen (TYLENOL) 325 MG tablet Take 2 tablets (650 mg total) by mouth every 6 (six) hours as needed for mild pain or headache., Starting Sat 10/24/2017, OTC    atorvastatin (LIPITOR) 40 MG tablet Take 1 tablet (40 mg total) by mouth daily at 6 PM., Starting Sat 10/24/2017, Print         Note:  This document was prepared using Dragon voice recognition software and may include unintentional dictation errors.     Darel Hong, MD 10/24/17 2251

## 2017-10-21 NOTE — ED Notes (Addendum)
MD at the bedside for pt evaluation  

## 2017-10-22 ENCOUNTER — Encounter: Payer: Self-pay | Admitting: Internal Medicine

## 2017-10-22 ENCOUNTER — Encounter
Admission: EM | Disposition: A | Discharge status: Home / Self Care | Payer: Self-pay | Source: Home / Self Care | Attending: Internal Medicine

## 2017-10-22 DIAGNOSIS — Z8249 Family history of ischemic heart disease and other diseases of the circulatory system: Secondary | ICD-10-CM | POA: Diagnosis not present

## 2017-10-22 DIAGNOSIS — Z955 Presence of coronary angioplasty implant and graft: Secondary | ICD-10-CM | POA: Diagnosis not present

## 2017-10-22 DIAGNOSIS — K219 Gastro-esophageal reflux disease without esophagitis: Secondary | ICD-10-CM | POA: Diagnosis present

## 2017-10-22 DIAGNOSIS — I350 Nonrheumatic aortic (valve) stenosis: Secondary | ICD-10-CM | POA: Diagnosis present

## 2017-10-22 DIAGNOSIS — E782 Mixed hyperlipidemia: Secondary | ICD-10-CM | POA: Diagnosis present

## 2017-10-22 DIAGNOSIS — J449 Chronic obstructive pulmonary disease, unspecified: Secondary | ICD-10-CM | POA: Diagnosis present

## 2017-10-22 DIAGNOSIS — D638 Anemia in other chronic diseases classified elsewhere: Secondary | ICD-10-CM | POA: Diagnosis present

## 2017-10-22 DIAGNOSIS — G4733 Obstructive sleep apnea (adult) (pediatric): Secondary | ICD-10-CM | POA: Diagnosis present

## 2017-10-22 DIAGNOSIS — I110 Hypertensive heart disease with heart failure: Secondary | ICD-10-CM | POA: Diagnosis present

## 2017-10-22 DIAGNOSIS — I480 Paroxysmal atrial fibrillation: Secondary | ICD-10-CM | POA: Diagnosis present

## 2017-10-22 DIAGNOSIS — Z6841 Body Mass Index (BMI) 40.0 and over, adult: Secondary | ICD-10-CM | POA: Diagnosis not present

## 2017-10-22 DIAGNOSIS — I5022 Chronic systolic (congestive) heart failure: Secondary | ICD-10-CM | POA: Diagnosis present

## 2017-10-22 DIAGNOSIS — Z951 Presence of aortocoronary bypass graft: Secondary | ICD-10-CM | POA: Diagnosis not present

## 2017-10-22 DIAGNOSIS — I25119 Atherosclerotic heart disease of native coronary artery with unspecified angina pectoris: Secondary | ICD-10-CM | POA: Diagnosis present

## 2017-10-22 DIAGNOSIS — I214 Non-ST elevation (NSTEMI) myocardial infarction: Secondary | ICD-10-CM | POA: Diagnosis present

## 2017-10-22 DIAGNOSIS — Z79899 Other long term (current) drug therapy: Secondary | ICD-10-CM | POA: Diagnosis not present

## 2017-10-22 DIAGNOSIS — Z89421 Acquired absence of other right toe(s): Secondary | ICD-10-CM | POA: Diagnosis not present

## 2017-10-22 DIAGNOSIS — G8929 Other chronic pain: Secondary | ICD-10-CM | POA: Diagnosis present

## 2017-10-22 DIAGNOSIS — Z7901 Long term (current) use of anticoagulants: Secondary | ICD-10-CM | POA: Diagnosis not present

## 2017-10-22 DIAGNOSIS — E1165 Type 2 diabetes mellitus with hyperglycemia: Secondary | ICD-10-CM | POA: Diagnosis present

## 2017-10-22 DIAGNOSIS — I252 Old myocardial infarction: Secondary | ICD-10-CM | POA: Diagnosis not present

## 2017-10-22 DIAGNOSIS — Z8673 Personal history of transient ischemic attack (TIA), and cerebral infarction without residual deficits: Secondary | ICD-10-CM | POA: Diagnosis not present

## 2017-10-22 DIAGNOSIS — M545 Low back pain: Secondary | ICD-10-CM | POA: Diagnosis present

## 2017-10-22 HISTORY — PX: LEFT HEART CATH AND CORS/GRAFTS ANGIOGRAPHY: CATH118250

## 2017-10-22 LAB — HEPATIC FUNCTION PANEL
ALBUMIN: 3.7 g/dL (ref 3.5–5.0)
ALK PHOS: 56 U/L (ref 38–126)
ALT: 27 U/L (ref 17–63)
AST: 26 U/L (ref 15–41)
BILIRUBIN TOTAL: 0.6 mg/dL (ref 0.3–1.2)
Bilirubin, Direct: <0.1 mg/dL — ABNORMAL LOW (ref 0.1–0.5)
TOTAL PROTEIN: 7.1 g/dL (ref 6.5–8.1)

## 2017-10-22 LAB — CBC
HEMATOCRIT: 35.8 % — AB (ref 40.0–52.0)
HEMOGLOBIN: 12.3 g/dL — AB (ref 13.0–18.0)
MCH: 30.4 pg (ref 26.0–34.0)
MCHC: 34.5 g/dL (ref 32.0–36.0)
MCV: 88.1 fL (ref 80.0–100.0)
Platelets: 148 10*3/uL — ABNORMAL LOW (ref 150–440)
RBC: 4.06 MIL/uL — AB (ref 4.40–5.90)
RDW: 14.9 % — ABNORMAL HIGH (ref 11.5–14.5)
WBC: 6.5 10*3/uL (ref 3.8–10.6)

## 2017-10-22 LAB — BASIC METABOLIC PANEL
ANION GAP: 8 (ref 5–15)
BUN: 22 mg/dL — ABNORMAL HIGH (ref 6–20)
CALCIUM: 8.5 mg/dL — AB (ref 8.9–10.3)
CO2: 25 mmol/L (ref 22–32)
Chloride: 104 mmol/L (ref 101–111)
Creatinine, Ser: 1.15 mg/dL (ref 0.61–1.24)
GFR calc Af Amer: >60 mL/min (ref >60)
GFR calc non Af Amer: >60 mL/min (ref >60)
GLUCOSE: 333 mg/dL — AB (ref 65–99)
POTASSIUM: 3.6 mmol/L (ref 3.5–5.1)
Sodium: 137 mmol/L (ref 135–145)

## 2017-10-22 LAB — GLUCOSE, CAPILLARY
Glucose-Capillary: 230 mg/dL — ABNORMAL HIGH (ref 65–99)
Glucose-Capillary: 191 mg/dL — ABNORMAL HIGH (ref 65–99)
Glucose-Capillary: 262 mg/dL — ABNORMAL HIGH (ref 65–99)

## 2017-10-22 LAB — TROPONIN I
TROPONIN I: 0.07 ng/mL — AB (ref <0.03)
TROPONIN I: 0.47 ng/mL — AB (ref <0.03)
Troponin I: 1.63 ng/mL (ref <0.03)

## 2017-10-22 LAB — BRAIN NATRIURETIC PEPTIDE: B NATRIURETIC PEPTIDE 5: 278 pg/mL — AB (ref 0.0–100.0)

## 2017-10-22 SURGERY — LEFT HEART CATH AND CORS/GRAFTS ANGIOGRAPHY
Anesthesia: Moderate Sedation

## 2017-10-22 MED ORDER — SODIUM CHLORIDE 0.9 % WEIGHT BASED INFUSION
1.0000 mL/kg/h | INTRAVENOUS | Status: DC
  Administered 2017-10-22: 1 mL/kg/h via INTRAVENOUS

## 2017-10-22 MED ORDER — SODIUM CHLORIDE 0.9 % WEIGHT BASED INFUSION
1.0000 mL/kg/h | INTRAVENOUS | Status: AC
  Administered 2017-10-22: 1 mL/kg/h via INTRAVENOUS

## 2017-10-22 MED ORDER — SODIUM CHLORIDE 0.9% FLUSH: 3.0000 mL | Freq: Two times a day (BID) | INTRAVENOUS | Status: DC

## 2017-10-22 MED ORDER — INSULIN ASPART PROT & ASPART (70-30 MIX) 100 UNIT/ML ~~LOC~~ SUSP: 100.0000 [IU] | Freq: Every day | SUBCUTANEOUS | Status: DC

## 2017-10-22 MED ORDER — POTASSIUM CHLORIDE CRYS ER 20 MEQ PO TBCR
20.0000 meq | EXTENDED_RELEASE_TABLET | Freq: Every day | ORAL | Status: DC
  Administered 2017-10-22 – 2017-10-24 (×3): 20 meq via ORAL
  Filled 2017-10-22 (×3): qty 1

## 2017-10-22 MED ORDER — ACETAMINOPHEN 325 MG PO TABS: 650.0000 mg | ORAL_TABLET | ORAL | Status: DC | PRN

## 2017-10-22 MED ORDER — MIDAZOLAM HCL 2 MG/2ML IJ SOLN
INTRAMUSCULAR | Status: DC | PRN
  Administered 2017-10-22: 1 mg via INTRAVENOUS

## 2017-10-22 MED ORDER — ONDANSETRON HCL 4 MG/2ML IJ SOLN: 4.0000 mg | Freq: Four times a day (QID) | INTRAMUSCULAR | Status: DC | PRN

## 2017-10-22 MED ORDER — MORPHINE SULFATE (PF) 2 MG/ML IV SOLN
2.0000 mg | INTRAVENOUS | Status: DC | PRN
  Administered 2017-10-23: 2 mg via INTRAVENOUS
  Filled 2017-10-22: qty 1

## 2017-10-22 MED ORDER — HEPARIN (PORCINE) IN NACL 100-0.45 UNIT/ML-% IJ SOLN
1250.0000 [IU]/h | INTRAMUSCULAR | Status: DC
  Administered 2017-10-22: 1150 [IU]/h via INTRAVENOUS
  Filled 2017-10-22: qty 250

## 2017-10-22 MED ORDER — UMECLIDINIUM-VILANTEROL 62.5-25 MCG/INH IN AEPB
1.0000 | INHALATION_SPRAY | Freq: Every day | RESPIRATORY_TRACT | Status: DC
  Administered 2017-10-22 – 2017-10-24 (×3): 1 via RESPIRATORY_TRACT
  Filled 2017-10-22: qty 14

## 2017-10-22 MED ORDER — HEPARIN BOLUS VIA INFUSION
4000.0000 [IU] | Freq: Once | INTRAVENOUS | Status: AC
  Administered 2017-10-22: 4000 [IU] via INTRAVENOUS
  Filled 2017-10-22: qty 4000

## 2017-10-22 MED ORDER — KETOROLAC TROMETHAMINE 30 MG/ML IJ SOLN
15.0000 mg | Freq: Once | INTRAMUSCULAR | Status: AC
Start: 2017-10-22 — End: 2017-10-22
  Administered 2017-10-22: 15 mg via INTRAVENOUS
  Filled 2017-10-22: qty 1

## 2017-10-22 MED ORDER — SODIUM CHLORIDE 0.9 % IV SOLN: 250.0000 mL | INTRAVENOUS | Status: DC | PRN

## 2017-10-22 MED ORDER — METOPROLOL TARTRATE 50 MG PO TABS
100.0000 mg | ORAL_TABLET | Freq: Two times a day (BID) | ORAL | Status: DC
  Administered 2017-10-22 – 2017-10-24 (×4): 100 mg via ORAL
  Filled 2017-10-22 (×5): qty 2

## 2017-10-22 MED ORDER — ASPIRIN 81 MG PO CHEW
324.0000 mg | CHEWABLE_TABLET | Freq: Once | ORAL | Status: DC
  Filled 2017-10-22: qty 4

## 2017-10-22 MED ORDER — HEPARIN (PORCINE) IN NACL 1000-0.9 UT/500ML-% IV SOLN
INTRAVENOUS | Status: AC
  Filled 2017-10-22: qty 1000

## 2017-10-22 MED ORDER — BIVALIRUDIN TRIFLUOROACETATE 250 MG IV SOLR
INTRAVENOUS | Status: AC
  Filled 2017-10-22: qty 250

## 2017-10-22 MED ORDER — INSULIN ASPART PROT & ASPART (70-30 MIX) 100 UNIT/ML ~~LOC~~ SUSP: 85.0000 [IU] | Freq: Every day | SUBCUTANEOUS | Status: DC

## 2017-10-22 MED ORDER — TORSEMIDE 20 MG PO TABS
40.0000 mg | ORAL_TABLET | Freq: Every day | ORAL | Status: DC
  Administered 2017-10-22 – 2017-10-24 (×3): 40 mg via ORAL
  Filled 2017-10-22 (×3): qty 2

## 2017-10-22 MED ORDER — CYCLOBENZAPRINE HCL 10 MG PO TABS: 10.0000 mg | ORAL_TABLET | Freq: Two times a day (BID) | ORAL | Status: DC

## 2017-10-22 MED ORDER — CLOPIDOGREL BISULFATE 75 MG PO TABS
75.0000 mg | ORAL_TABLET | Freq: Every day | ORAL | Status: DC
  Administered 2017-10-22 – 2017-10-24 (×2): 75 mg via ORAL
  Filled 2017-10-22 (×2): qty 1

## 2017-10-22 MED ORDER — APIXABAN 5 MG PO TABS: 5.0000 mg | ORAL_TABLET | Freq: Two times a day (BID) | ORAL | Status: DC

## 2017-10-22 MED ORDER — MORPHINE SULFATE (PF) 4 MG/ML IV SOLN: 4.0000 mg | INTRAVENOUS | Status: DC | PRN

## 2017-10-22 MED ORDER — ASPIRIN 81 MG PO CHEW: 81.0000 mg | CHEWABLE_TABLET | ORAL | Status: DC

## 2017-10-22 MED ORDER — INSULIN ASPART 100 UNIT/ML ~~LOC~~ SOLN
0.0000 [IU] | Freq: Three times a day (TID) | SUBCUTANEOUS | Status: DC
  Administered 2017-10-22: 3 [IU] via SUBCUTANEOUS
  Administered 2017-10-22: 8 [IU] via SUBCUTANEOUS
  Administered 2017-10-23 – 2017-10-24 (×3): 5 [IU] via SUBCUTANEOUS
  Filled 2017-10-22 (×5): qty 1

## 2017-10-22 MED ORDER — ASPIRIN EC 81 MG PO TBEC
81.0000 mg | DELAYED_RELEASE_TABLET | Freq: Every day | ORAL | Status: DC
  Filled 2017-10-22: qty 1

## 2017-10-22 MED ORDER — TRAZODONE HCL 100 MG PO TABS
100.0000 mg | ORAL_TABLET | Freq: Every day | ORAL | Status: DC
  Administered 2017-10-22 – 2017-10-23 (×2): 100 mg via ORAL
  Filled 2017-10-22 (×2): qty 1

## 2017-10-22 MED ORDER — PANTOPRAZOLE SODIUM 40 MG PO TBEC
40.0000 mg | DELAYED_RELEASE_TABLET | Freq: Every day | ORAL | Status: DC
  Administered 2017-10-23 – 2017-10-24 (×2): 40 mg via ORAL
  Filled 2017-10-22 (×2): qty 1

## 2017-10-22 MED ORDER — MECLIZINE HCL 12.5 MG PO TABS
12.5000 mg | ORAL_TABLET | Freq: Three times a day (TID) | ORAL | Status: DC | PRN
  Filled 2017-10-22: qty 0.5

## 2017-10-22 MED ORDER — SODIUM CHLORIDE 0.9 % WEIGHT BASED INFUSION: 1.0000 mL/kg/h | INTRAVENOUS | Status: DC

## 2017-10-22 MED ORDER — FENTANYL CITRATE (PF) 100 MCG/2ML IJ SOLN
INTRAMUSCULAR | Status: DC | PRN
  Administered 2017-10-22: 50 ug via INTRAVENOUS

## 2017-10-22 MED ORDER — GABAPENTIN 300 MG PO CAPS: 300.0000 mg | ORAL_CAPSULE | Freq: Two times a day (BID) | ORAL | Status: DC

## 2017-10-22 MED ORDER — DILTIAZEM HCL ER 60 MG PO CP12
180.0000 mg | ORAL_CAPSULE | Freq: Two times a day (BID) | ORAL | Status: DC
  Administered 2017-10-22 – 2017-10-24 (×4): 180 mg via ORAL
  Filled 2017-10-22 (×6): qty 3

## 2017-10-22 MED ORDER — LIDOCAINE HCL (PF) 1 % IJ SOLN
INTRAMUSCULAR | Status: DC | PRN
  Administered 2017-10-22: 18 mL via INTRADERMAL

## 2017-10-22 MED ORDER — FUROSEMIDE 10 MG/ML IJ SOLN
40.0000 mg | Freq: Once | INTRAMUSCULAR | Status: AC
  Administered 2017-10-22: 40 mg via INTRAVENOUS
  Filled 2017-10-22: qty 4

## 2017-10-22 MED ORDER — NITROGLYCERIN 0.4 MG SL SUBL: 0.4000 mg | SUBLINGUAL_TABLET | SUBLINGUAL | Status: DC | PRN

## 2017-10-22 MED ORDER — LISINOPRIL 10 MG PO TABS
10.0000 mg | ORAL_TABLET | Freq: Every day | ORAL | Status: DC
  Administered 2017-10-22 – 2017-10-24 (×3): 10 mg via ORAL
  Filled 2017-10-22 (×3): qty 1

## 2017-10-22 MED ORDER — PRAMIPEXOLE DIHYDROCHLORIDE 0.25 MG PO TABS
0.5000 mg | ORAL_TABLET | Freq: Three times a day (TID) | ORAL | Status: DC
  Administered 2017-10-22 – 2017-10-24 (×5): 0.5 mg via ORAL
  Filled 2017-10-22 (×5): qty 2

## 2017-10-22 MED ORDER — SODIUM CHLORIDE 0.9 % WEIGHT BASED INFUSION: 3.0000 mL/kg/h | INTRAVENOUS | Status: DC

## 2017-10-22 MED ORDER — SODIUM CHLORIDE 0.9% FLUSH: 3.0000 mL | INTRAVENOUS | Status: DC | PRN

## 2017-10-22 MED ORDER — SENNOSIDES-DOCUSATE SODIUM 8.6-50 MG PO TABS: 1.0000 | ORAL_TABLET | Freq: Every evening | ORAL | Status: DC | PRN

## 2017-10-22 MED ORDER — DILTIAZEM HCL ER COATED BEADS 300 MG PO CP24: 300.0000 mg | ORAL_CAPSULE | Freq: Every day | ORAL | Status: DC

## 2017-10-22 MED ORDER — BACLOFEN 10 MG PO TABS
10.0000 mg | ORAL_TABLET | Freq: Two times a day (BID) | ORAL | Status: DC
  Administered 2017-10-22 – 2017-10-24 (×5): 10 mg via ORAL
  Filled 2017-10-22 (×6): qty 1

## 2017-10-22 MED ORDER — IPRATROPIUM-ALBUTEROL 0.5-2.5 (3) MG/3ML IN SOLN
3.0000 mL | Freq: Four times a day (QID) | RESPIRATORY_TRACT | Status: DC | PRN
  Administered 2017-10-23: 3 mL via RESPIRATORY_TRACT
  Filled 2017-10-22: qty 3

## 2017-10-22 MED ORDER — IOPAMIDOL (ISOVUE-300) INJECTION 61%
INTRAVENOUS | Status: DC | PRN
  Administered 2017-10-22: 135 mL via INTRA_ARTERIAL

## 2017-10-22 MED ORDER — LIDOCAINE HCL (PF) 1 % IJ SOLN
INTRAMUSCULAR | Status: AC
  Filled 2017-10-22: qty 30

## 2017-10-22 MED ORDER — MIDAZOLAM HCL 2 MG/2ML IJ SOLN
INTRAMUSCULAR | Status: AC
  Filled 2017-10-22: qty 2

## 2017-10-22 MED ORDER — DULOXETINE HCL 20 MG PO CPEP
20.0000 mg | ORAL_CAPSULE | Freq: Every day | ORAL | Status: DC
  Administered 2017-10-23 – 2017-10-24 (×2): 20 mg via ORAL
  Filled 2017-10-22 (×3): qty 1

## 2017-10-22 MED ORDER — BISACODYL 5 MG PO TBEC: 5.0000 mg | DELAYED_RELEASE_TABLET | Freq: Every day | ORAL | Status: DC | PRN

## 2017-10-22 MED ORDER — SIMVASTATIN 20 MG PO TABS
40.0000 mg | ORAL_TABLET | Freq: Every day | ORAL | Status: DC
Start: 2017-10-22 — End: 2017-10-23
  Administered 2017-10-22: 40 mg via ORAL
  Filled 2017-10-22: qty 2
  Filled 2017-10-22: qty 4

## 2017-10-22 MED ORDER — INSULIN NPH (HUMAN) (ISOPHANE) 100 UNIT/ML ~~LOC~~ SUSP: 100.0000 [IU] | Freq: Two times a day (BID) | SUBCUTANEOUS | Status: DC

## 2017-10-22 MED ORDER — FENTANYL CITRATE (PF) 100 MCG/2ML IJ SOLN
INTRAMUSCULAR | Status: AC
  Filled 2017-10-22: qty 2

## 2017-10-22 SURGICAL SUPPLY — 10 items
CATH INFINITI 5 FR RCB (CATHETERS) ×3 IMPLANT
CATH INFINITI 5FR ANG PIGTAIL (CATHETERS) ×3 IMPLANT
CATH INFINITI 5FR JL4 (CATHETERS) ×3 IMPLANT
CATH INFINITI JR4 5F (CATHETERS) ×3 IMPLANT
DEVICE CLOSURE MYNXGRIP 5F (Vascular Products) ×3 IMPLANT
KIT MANI 3VAL PERCEP (MISCELLANEOUS) ×3 IMPLANT
NEEDLE PERC 18GX7CM (NEEDLE) ×3 IMPLANT
PACK CARDIAC CATH (CUSTOM PROCEDURE TRAY) ×3 IMPLANT
SHEATH PINNACLE 5F 10CM (SHEATH) ×3 IMPLANT
WIRE GUIDERIGHT .035X150 (WIRE) ×3 IMPLANT

## 2017-10-22 NOTE — ED Notes (Signed)
Dr. Mable Paris at the bedside to discuss labs and plan of care.

## 2017-10-22 NOTE — Progress Notes (Addendum)
Patient still NPO for possible catheterization.  Dr. Nehemiah Massed paged and he gave verbal orders to go ahead and give patient food today.    RN questioned him about whether to restart eliquis or whether patient needed to be on a heparin gtt.  Nehemiah Massed said to hold the eliquis and DO NOT start heparin at this time.  Cath still being considered.  He wants to ambulate patient to see if they are having any chest pain with exertion.  Continue to monitor patient on telemetry.

## 2017-10-22 NOTE — ED Notes (Signed)
Pt up to bedside toilet. Ambulatory with steady gait.

## 2017-10-22 NOTE — H&P (Signed)
[facility] NAME: Marcus Williams    MR#:  [id number]  DATE OF BIRTH:  [birth year]  DATE OF ADMISSION:  10/21/2017  PRIMARY CARE PHYSICIAN: Idelle Crouch, MD   REQUESTING/REFERRING PHYSICIAN: Darel Hong, MD  CHIEF COMPLAINT:   Chief Complaint  Patient presents with  . Chest Pain    HISTORY OF PRESENT ILLNESS:  Marcus Williams  is a 62 y.o. male with an extensive past medical history, including but not limited to morbid obesity, T2IDDM (w/ retinopathy + neuropathy), HTN, HLD, CAD/MI (s/p CABG x4, multiple stents, last Cath 02/7352), chronic systolic CHF/ICM (EF 29-92% as of 07/29/2017 Echo), Hx SVT/AFib (Eliquis), CVA, COPD, OSA (CPAP qHS), GERD, DJD/OA who p/w 1d Hx CP. Pt has a storied cardiac history, including CABG and multiple stents. His Cardiologist is Dr. Ubaldo Glassing. He endorses medication adherence, but states he has been off Eliquis since Tuesday because he is scheduled to have back injections for chronic lower back pain.  Pt states that he felt well prior to Wednesday (10/21/2017). He states that on Wednesday evening, @~1700PM, he began to experience CP while sitting on the sofa watching television. He characterizes the chest pain as being mild in severity initially, L-sided (above the L breast), sharp/stabbing, exertional, non-positional, non-pleuritic, non-reproducible w/ palpation, (+) radiation to LUE (L arm numbness w/o pain), (-) associated N/V/palpitations/diaphoresis/LH/LOC. As the pain was initially minimal, he believed it was heartburn. He states he was getting ready to go to church when the pain started, but he went to church anyway. He states that when he got out of the car to walk into the church, his CP got worse, and improved when he sat down and rested. He states that he went home after church. His wife is in a wheelchair, and he states he pushed his wife up the wheelchair ramp into the house. While doing this, he states his  chest pain got significantly worse. He took NTG x2, w/ no improvement in symptoms. He called his nephew, who drove him to the ED. He states that his CP resolved while in the ED, and he is CP-free, well-appearing and in no acute distress at the time of my Hx/examination. He states the CP he experienced on Wednesday was different from the CP he has experienced in the past in the setting of CAD/MI, though he was still worried given his Hx, prompting him to seek medical attention. He does make mention of some mild SOB that he stated occurred on waking up from sleep on Wednesday morning (relieved w/ a breathing treatment), but he denies SOB associated w/ his CP. He denies F/C, diarrhea/AP, cough, hemoptysis, wheezing, night sweats, rigors, weight loss, HA, vertigo, blurred vision, urinary symptoms.  PAST MEDICAL HISTORY:   Past Medical History:  Diagnosis Date  . A-fib ([place])   . Aortic stenosis, moderate 11/2015  . Arthritis   . CAD (coronary artery disease)   . Chronic systolic CHF (congestive heart failure) ([place])   . Diabetes mellitus without complication ([place])    INSULIN DEPENDENT  . GERD (gastroesophageal reflux disease)   . History of cardioversion   . Hypertension   . MI (myocardial infarction) ([place])   . OSA on CPAP   . Sepsis ([place])   . Shortness of breath dyspnea   . Stroke ([place])   . SVT (supraventricular tachycardia) ([place])     PAST SURGICAL HISTORY:   Past Surgical History:  Procedure Laterality Date  . AMPUTATION TOE  Right 05/06/2015   Procedure: AMPUTATION TOE;  Surgeon: Sharlotte Alamo, MD;  Location: [facility] ORS;  Service: Podiatry;  Laterality: Right;  . BUNIONECTOMY    . CARDIAC CATHETERIZATION N/A 10/02/2015   Procedure: Coronary/Grafts Angiography;  Surgeon: Teodoro Spray, MD;  Location: [facility];  Service: Cardiovascular;  Laterality: N/A;  . CARDIAC CATHETERIZATION N/A 11/21/2015   Procedure: Right and Left Heart Cath;  Surgeon: Sherren Mocha, MD;  Location: [facility];  Service: Cardiovascular;  Laterality: N/A;  . CORONARY ARTERY BYPASS GRAFT    . CORONARY STENT INTERVENTION N/A 07/30/2017   Procedure: CORONARY STENT INTERVENTION;  Surgeon: Isaias Cowman, MD;  Location: [facility];  Service: Cardiovascular;  Laterality: N/A;  . KNEE SURGERY    . quadruple bypass    . RIGHT/LEFT HEART CATH AND CORONARY/GRAFT ANGIOGRAPHY N/A 07/30/2017   Procedure: RIGHT/LEFT HEART CATH AND CORONARY/GRAFT ANGIOGRAPHY;  Surgeon: Isaias Cowman, MD;  Location: [facility];  Service: Cardiovascular;  Laterality: N/A;  . TEE WITHOUT CARDIOVERSION  11/21/2015  . TEE WITHOUT CARDIOVERSION N/A 11/21/2015   Procedure: TRANSESOPHAGEAL ECHOCARDIOGRAM (TEE);  Surgeon: Larey Dresser, MD;  Location: [facility]OSCOPY;  Service: Cardiovascular;  Laterality: N/A;    SOCIAL HISTORY:   Social History   Tobacco Use  . Smoking status: Never Smoker  . Smokeless tobacco: Never Used  Substance Use Topics  . Alcohol use: No    FAMILY HISTORY:   Family History  Problem Relation Age of Onset  . Stroke Mother   . Heart attack Mother   . Hypertension Mother   . Heart attack Father   . Hypertension Father   . Heart attack Brother        #1  . Diabetes Brother        #1  . Heart disease Brother        #2  . Lung disease Brother        #2  . Hypertension Brother        #2  . Diabetes Brother        #2    DRUG ALLERGIES:  No Known Allergies  REVIEW OF SYSTEMS:   Review of Systems  Constitutional: Negative for chills, diaphoresis, fever, malaise/fatigue and weight loss.  HENT: Negative for congestion, ear pain, hearing loss, nosebleeds, sinus pain, sore throat and tinnitus.   Eyes: Negative for blurred vision, double vision and photophobia.  Respiratory: Negative for cough, hemoptysis, sputum production, shortness of breath and wheezing.   Cardiovascular: Positive for chest pain and leg swelling (+) chronic leg edema. Negative for  palpitations, orthopnea, claudication and PND.  Gastrointestinal: Negative for abdominal pain, blood in stool, constipation, diarrhea, heartburn, melena, nausea and vomiting.  Genitourinary: Negative for dysuria, frequency, hematuria and urgency.  Musculoskeletal: Positive for back pain (+) chronic back pain at baseline. Negative for joint pain, myalgias and neck pain.  Skin: Negative for itching and rash.  Neurological: Negative for dizziness, tingling, tremors, sensory change, speech change, focal weakness, seizures, loss of consciousness, weakness and headaches.   MEDICATIONS AT HOME:   Prior to Admission medications   Medication Sig Start Date End Date Taking? Authorizing Provider  albuterol (PROVENTIL HFA;VENTOLIN HFA) 108 (90 Base) MCG/ACT inhaler Inhale 2 puffs into the lungs every 6 (six) hours as needed for wheezing or shortness of breath. 07/11/15  Yes Kasa, Maretta Bees, MD  amoxicillin (AMOXIL) 875 MG tablet Take 875 mg by mouth 2 (two) times daily. 10/22/17  Yes [provider]  apixaban (ELIQUIS) 5 MG TABS tablet Take 5 mg by mouth 2 (two) times daily.   Yes [provider]  baclofen (LIORESAL) 10 MG tablet Take 10 mg by mouth 2 (two) times daily as needed for muscle spasms.    Yes [provider]  clopidogrel (PLAVIX) 75 MG tablet Take 1 tablet (75 mg total) by mouth daily with breakfast. 08/01/17  Yes Max Sane, MD  diltiazem (CARDIZEM CD) 180 MG 24 hr capsule Take 180 mg by mouth 2 (two) times daily.   Yes [provider]  DULoxetine (CYMBALTA) 20 MG capsule Take 20 mg by mouth daily.   Yes [provider]  Fluticasone-Salmeterol (ADVAIR) 250-50 MCG/DOSE AEPB Inhale 1 puff into the lungs 2 (two) times daily.   Yes [provider]  insulin NPH-regular Human (NOVOLIN 70/30) (70-30) 100 UNIT/ML injection Inject 85-100 Units into the skin 2 (two) times daily with a meal. 85 units in the morning and 100 units in the evening   Yes [provider]  lisinopril (PRINIVIL,ZESTRIL) 10 MG tablet Take 1 tablet (10 mg total) by mouth daily. 08/14/17  Yes Gladstone Lighter, MD  metFORMIN (GLUCOPHAGE) 1000 MG tablet Take 1,000 mg by mouth 2 (two) times daily with a meal.   Yes [provider]  metoprolol (LOPRESSOR) 100 MG tablet Take 100 mg by mouth 2 (two) times daily. 10/15/15  Yes [provider]  nitroGLYCERIN (NITROSTAT) 0.4 MG SL tablet Place 0.4 mg under the tongue every 5 (five) minutes as needed for chest pain.   Yes [provider]  pantoprazole (PROTONIX) 40 MG tablet Take 40 mg by mouth daily.   Yes [provider]  potassium chloride 20 MEQ TBCR Take 20 mEq by mouth daily. 11/23/15  Yes Shirley Friar, PA-C  pramipexole (MIRAPEX) 0.5 MG tablet Take 0.5 mg by mouth 3 (three) times daily as needed.    Yes [provider]  predniSONE (DELTASONE) 10 MG tablet Take 10-40 mg by mouth daily with breakfast. 40 mg x2 days, 30 mg x2 day, 20 mg x2 days, 10 mg x2 days 10/22/17  Yes [provider]  simvastatin (ZOCOR) 40 MG tablet Take 40 mg by mouth at bedtime.    Yes [provider]  torsemide (DEMADEX) 20 MG tablet Take 2 tablets (40 mg total) by mouth daily. 08/14/17  Yes Gladstone Lighter, MD      VITAL SIGNS:  Blood pressure (!) 141/81, pulse 61, temperature 98.2 F (36.8 C), temperature source Oral, resp. rate 20, height 5\' 9"  (1.753 m), weight 127.9 kg (282 lb), SpO2 96 %.  PHYSICAL EXAMINATION:  Physical Exam  Constitutional: He is oriented to person, place, and time. He appears well-developed and well-nourished. He is active and cooperative.  Non-toxic appearance. He does not have a sickly appearance. He does not appear ill. No distress.  HENT:  Head: Normocephalic and atraumatic.  Mouth/Throat: No oropharyngeal exudate.  Eyes: Conjunctivae and EOM are normal. No scleral icterus.  Neck: Neck supple. No JVD present. No thyromegaly present.    Cardiovascular: Normal rate, regular rhythm, S1 normal and S2 normal.  No extrasystoles are present. Exam reveals no gallop, no S3, no S4, no distant heart sounds and no friction rub.  Murmur heard.  Systolic murmur is present with a grade of 3/6.  No diastolic murmur is present. Pulmonary/Chest: Effort normal and breath sounds normal. No accessory muscle usage or stridor. No apnea, no tachypnea and no bradypnea. No respiratory distress. He  has no decreased breath sounds. He has no wheezes. He has no rhonchi. He has no rales.  Abdominal: Soft. Bowel sounds are normal. He exhibits no distension. There is no tenderness. There is no rebound and no guarding.  Musculoskeletal:       Right lower leg: Normal. Right lower leg edema: (+) chronic trace B/L LE edema w/ stasis dermatitis skin changes.       Left lower leg: Normal. Left lower leg edema: (+) chronic trace B/L LE edema w/ stasis dermatitis skin changes.  Lymphadenopathy:    He has no cervical adenopathy.  Neurological: He is alert and oriented to person, place, and time. He is not disoriented.  Skin: Skin is warm and dry. No rash noted. He is not diaphoretic. No erythema.  (+) chronic trace B/L LE edema w/ stasis dermatitis skin changes  Psychiatric: He has a normal mood and affect. His speech is normal and behavior is normal. Judgment and thought content normal. His mood appears not anxious. He is not agitated. Cognition and memory are normal.   LABORATORY PANEL:   CBC Recent Labs  Lab 10/21/17 2327  WBC 6.5  HGB 12.3*  HCT 35.8*  PLT 148*   ------------------------------------------------------------------------------------------------------------------  Chemistries  Recent Labs  Lab 10/21/17 2327  NA 137  K 3.6  CL 104  CO2 25  GLUCOSE 333*  BUN 22*  CREATININE 1.15  CALCIUM 8.5*  AST 26  ALT 27  ALKPHOS 56  BILITOT 0.6    ------------------------------------------------------------------------------------------------------------------  Cardiac Enzymes Recent Labs  Lab 10/22/17 0233  TROPONINI 0.47*   ------------------------------------------------------------------------------------------------------------------  RADIOLOGY:  Dg Chest 2 View  Result Date: 10/22/2017 CLINICAL DATA:  62 year old male with chest pain. EXAM: CHEST - 2 VIEW COMPARISON:  Chest radiograph dated 08/10/2017 FINDINGS: There has been interval resolution of the previously seen left sided pneumonia. There is no focal consolidation, pleural effusion, or pneumothorax. The cardiac silhouette is within normal limits. No acute osseous pathology. Median sternotomy wires and CABG vascular clips. IMPRESSION: No active cardiopulmonary disease. Electronically Signed   By: Anner Crete M.D.   On: 10/22/2017 00:49   IMPRESSION AND PLAN:   A/P: 25M NSTEMI:  1.) CAD/NSTEMI: Pt w/ known Hx severe multivessel CAD (s/p CABG + stents, last cath 07/2017, Cardiolgist Dr. Ubaldo Glassing), p/w 1d Hx L-sided exertional CP improved w/ rest. Though CP different from that which pt has experienced in past, exertional character still concerning for stable angina. His EKG demonstrates ST elevations in III, aVR, V1 and ST depressions in I, II, aVL, V4-V6. Though alarming at first glance, these changes are chronic when comparing to EKG from 08/10/2017. Trop-I 0.07, 0.47, rpt pending. Pt on systemic anticoagulation (Eliquis), stopped recently in anticipation of back injections. I have restarted this medication, in addition to pt's ASA + Plavix. I have not heparinized the pt. Also resumed statin, beta blocker, ACE-I. Morphine, NTG, O2. Tele, cardiac monitoring. Cardiology consultation, pt may need repeat cardiac catheterization.  2.) T2IDDM/hyperglycemia: Pt states he did not take his Wednesday evening dose of insulin. Glucose 333. Resume home NPH, MSSI. Hold Metformin.  3.)  Normocytic anemia: Hgb 12.3, MCV 88.1. Suspect anemia of chronic disease. Low suspicion for active/acute bleeding. Monitor.  4.) HTN: c/w Lisinopril, Lopressor, Torsemide.  5.) HLD/CAD/MI/CHF/ICM/SVT/AFib/CVA: c/w ASA, Plavix, Eliquis, Statin, beta blocker, ACE-I, diuretic.  6.) COPD: DuoNebs PRN.  7.) OSA: CPAP qHS (EPAP unknown).  8.) GERD: c/w Protonix.  9.) FEN/GI: Cardiac diabetic diet, Protonix.  10.) DVT PPx: On full-dose therapeutic anticoagulation (  up until Tuesday, when Eliquis was stopped in anticipation of back injections). Eliquis resumed.  11.) Code status: Full code.  12.) Disposition: Admission, pt expected to stay > 2 midnights.   All the records are reviewed and case discussed with ED provider. Management plans discussed with the patient, family and they are in agreement.  CODE STATUS: Full code.  TOTAL TIME TAKING CARE OF THIS PATIENT: 90 minutes.    Arta Silence M.D on 10/22/2017 at 4:53 AM  Between 7am to 6pm - Pager - [phone]  After 6pm go to [email or web address] - Proofreader  [facility]  Office  [phone]  CC: Primary care physician; Idelle Crouch, MD   Note: This dictation was prepared with Dragon dictation along with smaller phrase technology. Any transcriptional errors that result from this process are unintentional.

## 2017-10-22 NOTE — Progress Notes (Addendum)
ANTICOAGULATION CONSULT NOTE - Initial Consult  Pharmacy Consult for Heparin Infusion Indication: chest pain/ACS  No Known Allergies  Patient Measurements: Height: 5\' 8"  (172.7 cm) Weight: 281 lb (127.5 kg) IBW/kg (Calculated) : 68.4 Heparin Dosing Weight: 98.1 kg  Vital Signs: Temp: 97.8 F (36.6 C) (05/09 1805) Temp Source: Oral (05/09 1805) BP: 135/72 (05/09 1805) Pulse Rate: 60 (05/09 1805)  Labs: Recent Labs    10/21/17 2327 10/22/17 0233 10/22/17 1313  HGB 12.3*  --   --   HCT 35.8*  --   --   PLT 148*  --   --   CREATININE 1.15  --   --   TROPONINI 0.07* 0.47* 1.63*    Estimated Creatinine Clearance: 86.7 mL/min (by C-G formula based on SCr of 1.15 mg/dL).   Medical History: Past Medical History:  Diagnosis Date  . A-fib ([place])   . Aortic stenosis, moderate 11/2015  . Arthritis   . CAD (coronary artery disease)   . Chronic systolic CHF (congestive heart failure) ([place])   . Diabetes mellitus without complication ([place])    INSULIN DEPENDENT  . GERD (gastroesophageal reflux disease)   . History of cardioversion   . Hypertension   . MI (myocardial infarction) ([place])   . OSA on CPAP   . Sepsis ([place])   . Shortness of breath dyspnea   . Stroke ([place])   . SVT (supraventricular tachycardia) ([facility])      Assessment: Patient is a 62yo male admitted for NSTEMI, s/p cardiac cath procedure today. Pharmacy consulted to begin Heparin infusion 2 hours post sheath removal. Patient was taking apixaban prior to admission, unclear when last dose was taken.  Goal of Therapy:  Heparin Level 0.3 - 0.7 aptt 68 - 109 Monitor platelets by anticoagulation protocol: Yes   Plan:  Will order Heparin 4000 units IV bolus followed by infusion rate of 1150 units/hr. Will check aptt and heparin level in 6hrs. Daily CBC.  Paulina Fusi, PharmD, BCPS 10/22/2017 7:50 PM

## 2017-10-22 NOTE — Progress Notes (Signed)
[facility] Encounter Note  Patient: Marcus Williams / Admit Date: 10/21/2017 / Date of Encounter: 10/22/2017, 6:09 PM   Subjective: Patient has had resolution of chest discomfort.  Troponin went to 1.5 sent with non-ST elevation myocardial infarction.  Patient on appropriate medication management for myocardial infarction Cardiac catheterization shows normal LV systolic function ejection fraction of 55% Occluded native arteries Patent graft to diagonal obtuse marginal and left anterior descending artery Significant distal stenosis of graft to right coronary artery of 80% as the culprit for non-ST elevation myocardial infarction  Review of Systems: Positive for: Redness of breath chest pain Negative for: Vision change, hearing change, syncope, dizziness, nausea, vomiting,diarrhea, bloody stool, stomach pain, cough, congestion, diaphoresis, urinary frequency, urinary pain,skin lesions, skin rashes Others previously listed  Objective: Telemetry: Normal sinus rhythm Physical Exam: Blood pressure 135/72, pulse 60, temperature 97.8 F (36.6 C), temperature source Oral, resp. rate 18, height 5\' 8"  (1.727 m), weight 281 lb (127.5 kg), SpO2 97 %. Body mass index is 42.73 kg/m. General: Well developed, well nourished, in no acute distress. Head: Normocephalic, atraumatic, sclera non-icteric, no xanthomas, nares are without discharge. Neck: No apparent masses Lungs: Normal respirations with no wheezes, no rhonchi, no rales , no crackles   Heart: Regular rate and rhythm, normal S1 S2, no murmur, no rub, no gallop, PMI is normal size and placement, carotid upstroke normal without bruit, jugular venous pressure normal Abdomen: Soft, non-tender, non-distended with normoactive bowel sounds. No hepatosplenomegaly. Abdominal aorta is normal size without bruit Extremities: No edema, no clubbing, no cyanosis, no ulcers,  Peripheral: 2+ radial, 2+ femoral, 2+ dorsal pedal pulses Neuro:  Alert and oriented. Moves all extremities spontaneously. Psych:  Responds to questions appropriately with a normal affect.   Intake/Output Summary (Last 24 hours) at 10/22/2017 1809 Last data filed at 10/22/2017 1808 Gross per 24 hour  Intake 139.39 ml  Output 2000 ml  Net -1860.61 ml    Inpatient Medications:  . aspirin  324 mg Oral Once  . aspirin EC  81 mg Oral Daily  . baclofen  10 mg Oral BID  . clopidogrel  75 mg Oral Q breakfast  . diltiazem  180 mg Oral Q12H  . DULoxetine  20 mg Oral Daily  . insulin aspart  0-15 Units Subcutaneous TID WC  . insulin aspart protamine- aspart  85 Units Subcutaneous Q breakfast   And  . insulin aspart protamine- aspart  100 Units Subcutaneous Q supper  . lisinopril  10 mg Oral Daily  . metoprolol tartrate  100 mg Oral BID  . pantoprazole  40 mg Oral Daily  . potassium chloride SA  20 mEq Oral Daily  . pramipexole  0.5 mg Oral TID  . simvastatin  40 mg Oral QHS  . torsemide  40 mg Oral Daily  . traZODone  100 mg Oral QHS  . umeclidinium-vilanterol  1 puff Inhalation Daily   Infusions:  . sodium chloride 1 mL/kg/hr (10/22/17 1757)    Labs: Recent Labs    10/21/17 2327  NA 137  K 3.6  CL 104  CO2 25  GLUCOSE 333*  BUN 22*  CREATININE 1.15  CALCIUM 8.5*   Recent Labs    10/21/17 2327  AST 26  ALT 27  ALKPHOS 56  BILITOT 0.6  PROT 7.1  ALBUMIN 3.7   Recent Labs    10/21/17 2327  WBC 6.5  HGB 12.3*  HCT 35.8*  MCV 88.1  PLT 148*   Recent Labs  10/21/17 2327 10/22/17 0233 10/22/17 1313  TROPONINI 0.07* 0.47* 1.63*   Invalid input(s): POCBNP No results for input(s): HGBA1C in the last 72 hours.   Weights: Filed Weights   10/21/17 2325 10/22/17 0557 10/22/17 1556  Weight: 282 lb (127.9 kg) 281 lb 9.6 oz (127.7 kg) 281 lb (127.5 kg)     Radiology/Studies:  Dg Chest 2 View  Result Date: 10/22/2017 CLINICAL DATA:  62 year old male with chest pain. EXAM: CHEST - 2 VIEW COMPARISON:  Chest radiograph dated  08/10/2017 FINDINGS: There has been interval resolution of the previously seen left sided pneumonia. There is no focal consolidation, pleural effusion, or pneumothorax. The cardiac silhouette is within normal limits. No acute osseous pathology. Median sternotomy wires and CABG vascular clips. IMPRESSION: No active cardiopulmonary disease. Electronically Signed   By: Anner Crete M.D.   On: 10/22/2017 00:49   US Abdomen Complete  Result Date: 10/07/2017 CLINICAL DATA:  Diarrhea for 2 weeks. EXAM: ABDOMEN ULTRASOUND COMPLETE COMPARISON:  CT scan of March 24, 2017. FINDINGS: Gallbladder: No gallstones or wall thickening visualized. No sonographic Murphy sign noted by sonographer. Common bile duct: Diameter: 2.9 mm which is within normal limits. Liver: No focal lesion identified. Within normal limits in parenchymal echogenicity. Portal vein is patent on color Doppler imaging with normal direction of blood flow towards the liver. IVC: No abnormality visualized. Pancreas: Visualized portion unremarkable. Spleen: Size and appearance within normal limits. Right Kidney: Length: 14 cm. Echogenicity within normal limits. No mass or hydronephrosis visualized. Left Kidney: Length: 14.2 cm. Echogenicity within normal limits. No mass or hydronephrosis visualized. Abdominal aorta: No aneurysm visualized. Other findings: None. IMPRESSION: No definite abnormality seen in the abdomen. Electronically Signed   By: Marijo Conception, M.D.   On: 10/07/2017 14:35     Assessment and Recommendation  62 y.o. male with known coronary artery disease status post coronary artery bypass graft paroxysmal nonvalvular atrial fibrillation hyperlipidemia hypertension with a non-ST elevation myocardial infarction with a cardiac catheterization showing a significant 80% stenosis of the distal graft to the right coronary artery 1.  Continue heparin and dual antiplatelet therapy for distal stenosis of graft to right coronary artery 2.  PCI and  stent placement of distal graft to right coronary artery in a.m. 3.  No use of Eliquis at this time due to concerns of bleeding risk 4.  Continue metoprolol and lisinopril for hypertension control as well as for maintenance of normal sinus rhythm 5.  High intensity cholesterol therapy 6.  Cardiac rehabilitation after above  Signed, Serafina Royals M.D. FACC

## 2017-10-22 NOTE — Progress Notes (Signed)
CRITICAL VALUE ALERT  Critical Value:  Troponin 1.63  Date & Time Notied:  10/22/2017. 2:00 PM    Provider Notified:Dr. Nehemiah Massed   Orders Received/Actions taken: Dr. Nehemiah Massed considering ordering cath for patient.  Will call back when he makes decision.

## 2017-10-22 NOTE — ED Notes (Signed)
Admitting MD at the bedside for pt evaluation.  

## 2017-10-22 NOTE — Progress Notes (Signed)
[facility] NAME: Marcus Williams    MR#:  [id number]  DATE OF BIRTH:  [birth year]  SUBJECTIVE:  CHIEF COMPLAINT: Patient denies any chest pain during my examination but troponin is trending up  REVIEW OF SYSTEMS:  CONSTITUTIONAL: No fever, fatigue or weakness.  EYES: No blurred or double vision.  EARS, NOSE, AND THROAT: No tinnitus or ear pain.  RESPIRATORY: No cough, shortness of breath, wheezing or hemoptysis.  CARDIOVASCULAR: No chest pain, orthopnea, edema.  GASTROINTESTINAL: No nausea, vomiting, diarrhea or abdominal pain.  GENITOURINARY: No dysuria, hematuria.  ENDOCRINE: No polyuria, nocturia,  HEMATOLOGY: No anemia, easy bruising or bleeding SKIN: No rash or lesion. MUSCULOSKELETAL: No joint pain or arthritis.   NEUROLOGIC: No tingling, numbness, weakness.  PSYCHIATRY: No anxiety or depression.   DRUG ALLERGIES:  No Known Allergies  VITALS:  Blood pressure (!) 159/72, pulse 61, temperature 98.2 F (36.8 C), temperature source Oral, resp. rate 15, height 5\' 8"  (1.727 m), weight 127.5 kg (281 lb), SpO2 97 %.  PHYSICAL EXAMINATION:  GENERAL:  62 y.o.-year-old patient lying in the bed with no acute distress.  EYES: Pupils equal, round, reactive to light and accommodation. No scleral icterus. Extraocular muscles intact.  HEENT: Head atraumatic, normocephalic. Oropharynx and nasopharynx clear.  NECK:  Supple, no jugular venous distention. No thyroid enlargement, no tenderness.  LUNGS: Normal breath sounds bilaterally, no wheezing, rales,rhonchi or crepitation. No use of accessory muscles of respiration.  CARDIOVASCULAR: S1, S2 normal. No murmurs, rubs, or gallops.  ABDOMEN: Soft, nontender, nondistended. Bowel sounds present. No organomegaly or mass.  EXTREMITIES: No pedal edema, cyanosis, or clubbing.  NEUROLOGIC: Cranial nerves II through XII are intact. Muscle strength 5/5 in all extremities. Sensation intact. Gait  not checked.  PSYCHIATRIC: The patient is alert and oriented x 3.  SKIN: No obvious rash, lesion, or ulcer.    LABORATORY PANEL:   CBC Recent Labs  Lab 10/21/17 2327  WBC 6.5  HGB 12.3*  HCT 35.8*  PLT 148*   ------------------------------------------------------------------------------------------------------------------  Chemistries  Recent Labs  Lab 10/21/17 2327  NA 137  K 3.6  CL 104  CO2 25  GLUCOSE 333*  BUN 22*  CREATININE 1.15  CALCIUM 8.5*  AST 26  ALT 27  ALKPHOS 56  BILITOT 0.6   ------------------------------------------------------------------------------------------------------------------  Cardiac Enzymes Recent Labs  Lab 10/22/17 1313  TROPONINI 1.63*   ------------------------------------------------------------------------------------------------------------------  RADIOLOGY:  Dg Chest 2 View  Result Date: 10/22/2017 CLINICAL DATA:  62 year old male with chest pain. EXAM: CHEST - 2 VIEW COMPARISON:  Chest radiograph dated 08/10/2017 FINDINGS: There has been interval resolution of the previously seen left sided pneumonia. There is no focal consolidation, pleural effusion, or pneumothorax. The cardiac silhouette is within normal limits. No acute osseous pathology. Median sternotomy wires and CABG vascular clips. IMPRESSION: No active cardiopulmonary disease. Electronically Signed   By: Anner Crete M.D.   On: 10/22/2017 00:49    EKG:   Orders placed or performed during the hospital encounter of 10/21/17  . EKG 12-Lead  . EKG 12-Lead  . ED EKG within 10 minutes  . ED EKG within 10 minutes  . EKG 12-Lead  . EKG 12-Lead    ASSESSMENT AND PLAN:     .) CAD/NSTEMI: Pt w/ known Hx severe multivessel CAD (s/p CABG + stents, last cath 07/2017, Cardiolgist Dr. Ubaldo Glassing), p/w 1d Hx L-sided exertional CP improved w/ rest, concerning for stable angina.  His EKG demonstrates ST elevations in  III, aVR, V1 and ST depressions in I, II, aVL, V4-V6  these changes are chronic when comparing to EKG from 08/10/2017. T trop-I 0.07, 0.47, rpt 1.6 on  statin, beta blocker, ACE-I. Morphine, NTG, O2.  Tele, cardiac monitoring.  repeat cardiac catheterization today by Dr. Nehemiah Massed  2.) T2IDDM/hyperglycemia: Pt states he did not take his Wednesday evening dose of insulin. Glucose 333. Resume home NPH, SSI. Hold Metformin.  3.) Normocytic anemia: Hgb 12.3, MCV 88.1. Suspect anemia of chronic disease. Low suspicion for active/acute bleeding. Monitor.  4.) HTN: c/w Lisinopril, Lopressor, Torsemide.  5.) HLD/CAD/MI/CHF/ICM/SVT/AFib/CVA: c/w ASA, Plavix, Eliquis, Statin, beta blocker, ACE-I, diuretic.  6.) COPD: DuoNebs PRN.  7.) OSA: CPAP qHS (EPAP unknown).  8.) GERD: c/w Protonix.  9.) FEN/GI: Cardiac diabetic diet, Protonix.  10.) DVT PPx: . Eliquis on hold as patient scheduled for cardiac cath today SCDs meanwhile  11.) Code status: Full code.     All the records are reviewed and case discussed with Care Management/Social Workerr. Management plans discussed with the patient, family and they are in agreement.  CODE STATUS: fc  TOTAL TIME TAKING CARE OF THIS PATIENT: 22minutes.   POSSIBLE D/C IN 1-2DAYS, DEPENDING ON CLINICAL CONDITION.  Note: This dictation was prepared with Dragon dictation along with smaller phrase technology. Any transcriptional errors that result from this process are unintentional.   [name] M.D on 10/22/2017 at 4:28 PM  Between 7am to 6pm - Pager - [phone] After 6pm go to [email or web address] - password [email or web address] [facility]  Office  [phone]  CC: Primary care physician; Idelle Crouch, MD

## 2017-10-22 NOTE — Progress Notes (Signed)
Patient post heart cath per Dr Nehemiah Massed, vitals stable. Right groin without bleeding nor hematoma, sinus rhythm per monitor. Denies complaints. Dr Nehemiah Massed out to speak with patient with questions answered. Patient to start heparin gtt later this pm  Post heart cath, along with iv hydration overnight, and return in am for cath with PCI, report called to Amy RN on telemetry with plan reviewed.

## 2017-10-22 NOTE — Progress Notes (Addendum)
Inpatient Diabetes Program Recommendations  AACE/ADA: New Consensus Statement on Inpatient Glycemic Control (2015)  Target Ranges:  Prepandial:   less than 140 mg/dL      Peak postprandial:   less than 180 mg/dL (1-2 hours)      Critically ill patients:  140 - 180 mg/dL   Lab Results  Component Value Date   GLUCAP 230 (H) 10/22/2017   HGBA1C 8.0 (H) 03/01/2017    Review of Glycemic Control  Diabetes history: DM 2 Outpatient Diabetes medications: 70/30 85 units qam, 100 units qpm (basal insulin equivalent 129.5 units, 55.5 units of short acting insulin), Metformin 1000 mg BID Current orders for Inpatient glycemic control: 70/30 85 units qam, 100 units qpm, Novolog Moderate Correction 0-15 units tid  Inpatient Diabetes Program Recommendations:    70/30 insulin is given with meals.  While patient is NPO please consider basal bolus with Lantus and Novolog Correction scale. Lantus 25 units daily (0.2units/kg).  Thanks,  Tama Headings RN, MSN, BC-ADM, [facility] Inpatient Diabetes Coordinator Team Pager [phone] (8a-5p)

## 2017-10-22 NOTE — Consult Note (Signed)
[facility] Consultation Note  Patient ID: Marcus Williams, MRN: [id number], DOB/AGE: 62-Jul-1957 62 y.o. Admit date: 10/21/2017   Date of Consult: 10/22/2017 Primary Physician: Idelle Crouch, MD Primary Cardiologist: Ubaldo Glassing  Chief Complaint:  Chief Complaint  Patient presents with  . Chest Pain   Reason for Consult: Chest pain  HPI: 62 y.o. male with known poor paroxysmal nonvalvular atrial fibrillation moderate aortic valve stenosis coronary artery disease status post coronary artery bypass graft hyperlipidemia hypertension with recent PCI and stent placement of distal anastomosis of graft to right coronary artery with multiple stents after non-ST elevation myocardial infarction.  The patient has been on appropriate medication management for atrial fibrillation for heart rate control and maintains normal sinus rhythm as well as anticoagulation with stroke risk.  There is been no bleeding complications.  The patient also has had aspirin and Plavix which is triple therapy for his recent stent placement and myocardial infarction.  We would consider the possibility of discontinuation of aspirin.  In addition to that the patient has had new onset of chest discomfort left-sided chest discomfort as well as increasing physical activity chest discomfort and shortness of breath over the last several weeks.  When arriving to the emergency room the patient did not have any relief with this chest discomfort with nitroglycerin but it did slowly improve with rest.  EKG had shown normal sinus rhythm with T wave and ST depression and preventricular contractions.  The patient also had a troponin level of 0.47 possibly consistent with unstable angina or non-ST elevation myocardial infarction.  Currently the patient has felt much better at this time with no further symptoms.  He has been on appropriate ACE inhibitor beta-blocker and simvastatin for his appropriate treatment of cardiovascular disease.  After  review of his cardiac catheterization he does have severe diffuse disease with patent grafts and stent that was placed and there is some concerns that the patient just may have small vessel disease and not any major new restenosis.  Therefore it may be considered to medically manage and further evaluate by functional study  Past Medical History:  Diagnosis Date  . A-fib ([place])   . Aortic stenosis, moderate 11/2015  . Arthritis   . CAD (coronary artery disease)   . Chronic systolic CHF (congestive heart failure) ([place])   . Diabetes mellitus without complication ([place])    INSULIN DEPENDENT  . GERD (gastroesophageal reflux disease)   . History of cardioversion   . Hypertension   . MI (myocardial infarction) ([place])   . OSA on CPAP   . Sepsis ([place])   . Shortness of breath dyspnea   . Stroke ([place])   . SVT (supraventricular tachycardia) [facility])       Surgical History:  Past Surgical History:  Procedure Laterality Date  . 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 BUNIONECTOMY    . CARDIAC CATHETERIZATION N/A 10/02/2015   Procedure: Coronary/Grafts Angiography;  Surgeon: Teodoro Spray, MD;  Location: [place] CV LAB;  Service: Cardiovascular;  Laterality: N/A;  . CARDIAC CATHETERIZATION N/A 11/21/2015   Procedure: Right and Left Heart Cath;  Surgeon: Sherren Mocha, MD;  Location: [place] CV LAB;  Service: Cardiovascular;  Laterality: N/A;  . CORONARY ARTERY BYPASS GRAFT    . CORONARY STENT INTERVENTION N/A 07/30/2017   Procedure: CORONARY STENT INTERVENTION;  Surgeon: Isaias Cowman, MD;  Location: [facility]  INVASIVE CV LAB;  Service: Cardiovascular;  Laterality: N/A;  . KNEE SURGERY    . quadruple bypass    . RIGHT/LEFT HEART CATH AND CORONARY/GRAFT ANGIOGRAPHY N/A 07/30/2017   Procedure: RIGHT/LEFT HEART CATH AND CORONARY/GRAFT ANGIOGRAPHY;  Surgeon: Isaias Cowman, MD;   Location: [place] CV LAB;  Service: Cardiovascular;  Laterality: N/A;  . TEE WITHOUT CARDIOVERSION  11/21/2015  . TEE WITHOUT CARDIOVERSION N/A 11/21/2015   Procedure: TRANSESOPHAGEAL ECHOCARDIOGRAM (TEE);  Surgeon: Larey Dresser, MD;  Location: [place];  Service: Cardiovascular;  Laterality: N/A;     Home Meds: Prior to Admission medications   Medication Sig Start Date End Date Taking? Authorizing Provider  albuterol (PROVENTIL HFA;VENTOLIN HFA) 108 (90 Base) MCG/ACT inhaler Inhale 2 puffs into the lungs every 6 (six) hours as needed for wheezing or shortness of breath. 07/11/15  Yes Kasa, Maretta Bees, MD  amoxicillin (AMOXIL) 875 MG tablet Take 875 mg by mouth 2 (two) times daily. 10/22/17  Yes [provider]  apixaban (ELIQUIS) 5 MG TABS tablet Take 5 mg by mouth 2 (two) times daily.   Yes [provider]  baclofen (LIORESAL) 10 MG tablet Take 10 mg by mouth 2 (two) times daily as needed for muscle spasms.    Yes [provider]  clopidogrel (PLAVIX) 75 MG tablet Take 1 tablet (75 mg total) by mouth daily with breakfast. 08/01/17  Yes Max Sane, MD  diltiazem (CARDIZEM CD) 180 MG 24 hr capsule Take 180 mg by mouth 2 (two) times daily.   Yes [provider]  DULoxetine (CYMBALTA) 20 MG capsule Take 20 mg by mouth daily.   Yes [provider]  Fluticasone-Salmeterol (ADVAIR) 250-50 MCG/DOSE AEPB Inhale 1 puff into the lungs 2 (two) times daily.   Yes [provider]  insulin NPH-regular Human (NOVOLIN 70/30) (70-30) 100 UNIT/ML injection Inject 85-100 Units into the skin 2 (two) times daily with a meal. 85 units in the morning and 100 units in the evening   Yes [provider]  lisinopril (PRINIVIL,ZESTRIL) 10 MG tablet Take 1 tablet (10 mg total) by mouth daily. 08/14/17  Yes Gladstone Lighter, MD  metFORMIN (GLUCOPHAGE) 1000 MG tablet Take 1,000 mg by mouth 2 (two) times daily with a meal.   Yes [provider]   metoprolol (LOPRESSOR) 100 MG tablet Take 100 mg by mouth 2 (two) times daily. 10/15/15  Yes [provider]  nitroGLYCERIN (NITROSTAT) 0.4 MG SL tablet Place 0.4 mg under the tongue every 5 (five) minutes as needed for chest pain.   Yes [provider]  pantoprazole (PROTONIX) 40 MG tablet Take 40 mg by mouth daily.   Yes [provider]  potassium chloride 20 MEQ TBCR Take 20 mEq by mouth daily. 11/23/15  Yes Shirley Friar, PA-C  pramipexole (MIRAPEX) 0.5 MG tablet Take 0.5 mg by mouth 3 (three) times daily as needed.    Yes [provider]  predniSONE (DELTASONE) 10 MG tablet Take 10-40 mg by mouth daily with breakfast. 40 mg x2 days, 30 mg x2 day, 20 mg x2 days, 10 mg x2 days 10/22/17  Yes [provider]  simvastatin (ZOCOR) 40 MG tablet Take 40 mg by mouth at bedtime.    Yes [provider]  torsemide (DEMADEX) 20 MG tablet Take 2 tablets (40 mg total) by mouth daily. 08/14/17  Yes Gladstone Lighter, MD    Inpatient Medications:  . apixaban  5 mg Oral BID  . aspirin  324 mg Oral Once  .  aspirin EC  81 mg Oral Daily  . baclofen  10 mg Oral BID  . clopidogrel  75 mg Oral Q breakfast  . diltiazem  180 mg Oral Q12H  . insulin aspart  0-15 Units Subcutaneous TID WC  . insulin aspart protamine- aspart  85 Units Subcutaneous Q breakfast   And  . insulin aspart protamine- aspart  100 Units Subcutaneous Q supper  . lisinopril  10 mg Oral Daily  . metoprolol tartrate  100 mg Oral BID  . pantoprazole  40 mg Oral Daily  . potassium chloride SA  20 mEq Oral Daily  . pramipexole  0.5 mg Oral TID  . simvastatin  40 mg Oral QHS  . torsemide  40 mg Oral Daily  . traZODone  100 mg Oral QHS  . umeclidinium-vilanterol  1 puff Inhalation Daily     Allergies: No Known Allergies  Social History   Socioeconomic History  . Marital status: Married    Spouse name: Not on file  . Number of children: Not on file  . Years of education: Not  on file  . Highest education level: Not on file  Occupational History  . Not on file  Social Needs  . Financial resource strain: Not on file  . Food insecurity:    Worry: Not on file    Inability: Not on file  . Transportation needs:    Medical: Not on file    Non-medical: Not on file  Tobacco Use  . Smoking status: Never Smoker  . Smokeless tobacco: Never Used  Substance and Sexual Activity  . Alcohol use: No  . Drug use: No  . Sexual activity: Yes  Lifestyle  . Physical activity:    Days per week: Not on file    Minutes per session: Not on file  . Stress: Not on file  Relationships  . Social connections:    Talks on phone: Not on file    Gets together: Not on file    Attends religious service: Not on file    Active member of club or organization: Not on file    Attends meetings of clubs or organizations: Not on file    Relationship status: Not on file  . Intimate partner violence:    Fear of current or ex partner: Not on file    Emotionally abused: Not on file    Physically abused: Not on file    Forced sexual activity: Not on file  Other Topics Concern  . Not on file  Social History Narrative  . Not on file     Family History  Problem Relation Age of Onset  . Stroke Mother   . Heart attack Mother   . Hypertension Mother   . Heart attack Father   . Hypertension Father   . Heart attack Brother        #1  . Diabetes Brother        #1  . Heart disease Brother        #2  . Lung disease Brother        #2  . Hypertension Brother        #2  . Diabetes Brother        #2     Review of Systems Positive for chest pain shortness of breath Negative for: General:  chills, fever, night sweats or weight changes.  Cardiovascular: PND orthopnea syncope dizziness  Dermatological skin lesions rashes Respiratory: Cough congestion Urologic: Frequent urination urination at night  and hematuria Abdominal: negative for nausea, vomiting, diarrhea, bright red blood per  rectum, melena, or hematemesis Neurologic: negative for visual changes, and/or hearing changes  All other systems reviewed and are otherwise negative except as noted above.  Labs: Recent Labs    10/21/17 2327 10/22/17 0233  TROPONINI 0.07* 0.47*   Lab Results  Component Value Date   WBC 6.5 10/21/2017   HGB 12.3 (L) 10/21/2017   HCT 35.8 (L) 10/21/2017   MCV 88.1 10/21/2017   PLT 148 (L) 10/21/2017    Recent Labs  Lab 10/21/17 2327  NA 137  K 3.6  CL 104  CO2 25  BUN 22*  CREATININE 1.15  CALCIUM 8.5*  PROT 7.1  BILITOT 0.6  ALKPHOS 56  ALT 27  AST 26  GLUCOSE 333*   Lab Results  Component Value Date   CHOL 142 03/01/2017   HDL 31 (L) 03/01/2017   LDLCALC 70 03/01/2017   TRIG 206 (H) 03/01/2017   No results found for: DDIMER  Radiology/Studies:  Dg Chest 2 View  Result Date: 10/22/2017 CLINICAL DATA:  62 year old male with chest pain. EXAM: CHEST - 2 VIEW COMPARISON:  Chest radiograph dated 08/10/2017 FINDINGS: There has been interval resolution of the previously seen left sided pneumonia. There is no focal consolidation, pleural effusion, or pneumothorax. The cardiac silhouette is within normal limits. No acute osseous pathology. Median sternotomy wires and CABG vascular clips. IMPRESSION: No active cardiopulmonary disease. Electronically Signed   By: Anner Crete M.D.   On: 10/22/2017 00:49   US Abdomen Complete  Result Date: 10/07/2017 CLINICAL DATA:  Diarrhea for 2 weeks. EXAM: ABDOMEN ULTRASOUND COMPLETE COMPARISON:  CT scan of March 24, 2017. FINDINGS: Gallbladder: No gallstones or wall thickening visualized. No sonographic Murphy sign noted by sonographer. Common bile duct: Diameter: 2.9 mm which is within normal limits. Liver: No focal lesion identified. Within normal limits in parenchymal echogenicity. Portal vein is patent on color Doppler imaging with normal direction of blood flow towards the liver. IVC: No abnormality visualized. Pancreas:  Visualized portion unremarkable. Spleen: Size and appearance within normal limits. Right Kidney: Length: 14 cm. Echogenicity within normal limits. No mass or hydronephrosis visualized. Left Kidney: Length: 14.2 cm. Echogenicity within normal limits. No mass or hydronephrosis visualized. Abdominal aorta: No aneurysm visualized. Other findings: None. IMPRESSION: No definite abnormality seen in the abdomen. Electronically Signed   By: Marijo Conception, M.D.   On: 10/07/2017 14:35    EKG: Normal sinus rhythm with ST depression in the lateral leads and preventricular contractions  Weights: Filed Weights   10/21/17 2325 10/22/17 0557  Weight: 282 lb (127.9 kg) 281 lb 9.6 oz (127.7 kg)     Physical Exam: Blood pressure (!) 116/56, pulse (!) 55, temperature (!) 97.5 F (36.4 C), temperature source Oral, resp. rate 16, height 5\' 8"  (1.727 m), weight 281 lb 9.6 oz (127.7 kg), SpO2 94 %. Body mass index is 42.82 kg/m. General: Well developed, well nourished, in no acute distress. Head eyes ears nose throat: Normocephalic, atraumatic, sclera non-icteric, no xanthomas, nares are without discharge. No apparent thyromegaly and/or mass  Lungs: Normal respiratory effort.  Few wheezes, no rales, no rhonchi.  Heart: RRR with normal S1 S2.  3+ aortic murmur gallop, no rub, PMI is normal size and placement, carotid upstroke normal without bruit, jugular venous pressure is normal Abdomen: Soft, non-tender, non-distended with normoactive bowel sounds. No hepatomegaly. No rebound/guarding. No obvious abdominal masses. Abdominal aorta is normal size without bruit Extremities:  Trace edema. no cyanosis, no clubbing, no ulcers  Peripheral : 2+ bilateral upper extremity pulses, 2+ bilateral femoral pulses, 2+ bilateral dorsal pedal pulse Neuro: Alert and oriented. No facial asymmetry. No focal deficit. Moves all extremities spontaneously. Musculoskeletal: Normal muscle tone without kyphosis Psych:  Responds to questions  appropriately with a normal affect.    Assessment: 62 year old male with known coronary artery disease status post coronary bypass graft essential hypertension mixed hyperlipidemia moderate aortic stenosis paroxysmal nonvalvular atrial fibrillation with chest pain and elevated troponin needing further treatment options  Plan: 1.  Continue metoprolol lisinopril for further treatment of hypertension control and previous myocardial infarction 2.  Diltiazem for heart rate control and maintenance of normal rhythm 3.  Hold Eliquis until further evaluation and potential treatment options including invasive procedure including cardiac catheterization. 4.  Possible cardiac catheterization to assess coronary anatomy and further treatment thereof is necessary.  Patient understands risk and benefits of cardiac catheterization.  This includes a possibility of death stroke heart attack infection bleeding or blood clot.  He is at low risk for conscious sedation 5.  High intensity cholesterol therapy 6.  Further evaluation of EKG and serial troponin levels as well as consideration of functional study prior to further invasive study is necessary  Signed, Corey Skains M.D. [facility] 10/22/2017, 11:09 AM

## 2017-10-23 ENCOUNTER — Encounter: Payer: Self-pay | Admitting: Internal Medicine

## 2017-10-23 ENCOUNTER — Encounter
Admission: EM | Disposition: A | Discharge status: Home / Self Care | Payer: Self-pay | Source: Home / Self Care | Attending: Internal Medicine

## 2017-10-23 DIAGNOSIS — Z955 Presence of coronary angioplasty implant and graft: Secondary | ICD-10-CM

## 2017-10-23 HISTORY — PX: CORONARY STENT INTERVENTION: CATH118234

## 2017-10-23 LAB — GLUCOSE, CAPILLARY
GLUCOSE-CAPILLARY: 237 mg/dL — AB (ref 65–99)
GLUCOSE-CAPILLARY: 245 mg/dL — AB (ref 65–99)
Glucose-Capillary: 204 mg/dL — ABNORMAL HIGH (ref 65–99)
Glucose-Capillary: 205 mg/dL — ABNORMAL HIGH (ref 65–99)

## 2017-10-23 LAB — CBC
HEMATOCRIT: 35.3 % — AB (ref 40.0–52.0)
Hemoglobin: 12 g/dL — ABNORMAL LOW (ref 13.0–18.0)
MCH: 30.1 pg (ref 26.0–34.0)
MCHC: 34 g/dL (ref 32.0–36.0)
MCV: 88.6 fL (ref 80.0–100.0)
Platelets: 139 10*3/uL — ABNORMAL LOW (ref 150–440)
RBC: 3.99 MIL/uL — ABNORMAL LOW (ref 4.40–5.90)
RDW: 14.4 % (ref 11.5–14.5)
WBC: 6.5 10*3/uL (ref 3.8–10.6)

## 2017-10-23 LAB — POCT ACTIVATED CLOTTING TIME: Activated Clotting Time: 356 seconds

## 2017-10-23 LAB — HEPARIN LEVEL (UNFRACTIONATED): Heparin Unfractionated: 0.61 IU/mL (ref 0.30–0.70)

## 2017-10-23 LAB — APTT: APTT: 51 s — AB (ref 24–36)

## 2017-10-23 SURGERY — CORONARY STENT INTERVENTION
Anesthesia: Moderate Sedation

## 2017-10-23 MED ORDER — SODIUM CHLORIDE 0.9% FLUSH
3.0000 mL | Freq: Two times a day (BID) | INTRAVENOUS | Status: DC
  Administered 2017-10-23 – 2017-10-24 (×2): 3 mL via INTRAVENOUS

## 2017-10-23 MED ORDER — GUAIFENESIN-DM 100-10 MG/5ML PO SYRP: 5.0000 mL | ORAL_SOLUTION | ORAL | Status: DC | PRN

## 2017-10-23 MED ORDER — ONDANSETRON HCL 4 MG/2ML IJ SOLN: 4.0000 mg | Freq: Four times a day (QID) | INTRAMUSCULAR | Status: DC | PRN

## 2017-10-23 MED ORDER — LIDOCAINE HCL (PF) 1 % IJ SOLN
INTRAMUSCULAR | Status: AC
  Filled 2017-10-23: qty 30

## 2017-10-23 MED ORDER — SODIUM CHLORIDE 0.9% FLUSH
3.0000 mL | Freq: Two times a day (BID) | INTRAVENOUS | Status: DC
  Administered 2017-10-23: 3 mL via INTRAVENOUS

## 2017-10-23 MED ORDER — INSULIN ASPART 100 UNIT/ML ~~LOC~~ SOLN
0.0000 [IU] | Freq: Every day | SUBCUTANEOUS | Status: DC
Start: 2017-10-23 — End: 2017-10-24
  Administered 2017-10-23: 2 [IU] via SUBCUTANEOUS
  Filled 2017-10-23: qty 1

## 2017-10-23 MED ORDER — BIVALIRUDIN BOLUS VIA INFUSION - CUPID
INTRAVENOUS | Status: DC | PRN
  Administered 2017-10-23: 95.625 mg via INTRAVENOUS

## 2017-10-23 MED ORDER — SODIUM CHLORIDE 0.9 % WEIGHT BASED INFUSION
3.0000 mL/kg/h | INTRAVENOUS | Status: AC
  Administered 2017-10-23: 3 mL/kg/h via INTRAVENOUS

## 2017-10-23 MED ORDER — MIDAZOLAM HCL 2 MG/2ML IJ SOLN
INTRAMUSCULAR | Status: DC | PRN
  Administered 2017-10-23: 1 mg via INTRAVENOUS

## 2017-10-23 MED ORDER — INSULIN ASPART 100 UNIT/ML ~~LOC~~ SOLN
4.0000 [IU] | Freq: Three times a day (TID) | SUBCUTANEOUS | Status: DC
  Administered 2017-10-23 – 2017-10-24 (×3): 4 [IU] via SUBCUTANEOUS
  Filled 2017-10-23 (×3): qty 1

## 2017-10-23 MED ORDER — BIVALIRUDIN TRIFLUOROACETATE 250 MG IV SOLR
INTRAVENOUS | Status: AC
  Filled 2017-10-23: qty 250

## 2017-10-23 MED ORDER — ATORVASTATIN CALCIUM 20 MG PO TABS
40.0000 mg | ORAL_TABLET | Freq: Every day | ORAL | Status: DC
  Administered 2017-10-23: 40 mg via ORAL
  Filled 2017-10-23: qty 2

## 2017-10-23 MED ORDER — SODIUM CHLORIDE 0.9 % IV SOLN: 250.0000 mL | INTRAVENOUS | Status: DC | PRN

## 2017-10-23 MED ORDER — SODIUM CHLORIDE 0.9% FLUSH: 3.0000 mL | INTRAVENOUS | Status: DC | PRN

## 2017-10-23 MED ORDER — IOPAMIDOL (ISOVUE-300) INJECTION 61%
INTRAVENOUS | Status: DC | PRN
  Administered 2017-10-23: 150 mL via INTRA_ARTERIAL

## 2017-10-23 MED ORDER — MIDAZOLAM HCL 2 MG/2ML IJ SOLN
INTRAMUSCULAR | Status: AC
  Filled 2017-10-23: qty 2

## 2017-10-23 MED ORDER — ASPIRIN EC 325 MG PO TBEC
DELAYED_RELEASE_TABLET | ORAL | Status: AC
  Filled 2017-10-23: qty 1

## 2017-10-23 MED ORDER — CLOPIDOGREL BISULFATE 75 MG PO TABS
300.0000 mg | ORAL_TABLET | Freq: Every day | ORAL | Status: DC
  Administered 2017-10-23: 300 mg via ORAL

## 2017-10-23 MED ORDER — HYDRALAZINE HCL 20 MG/ML IJ SOLN: 5.0000 mg | INTRAMUSCULAR | Status: AC | PRN

## 2017-10-23 MED ORDER — SODIUM CHLORIDE 0.9 % WEIGHT BASED INFUSION
1.0000 mL/kg/h | INTRAVENOUS | Status: DC
  Administered 2017-10-23: 1 mL/kg/h via INTRAVENOUS

## 2017-10-23 MED ORDER — LABETALOL HCL 5 MG/ML IV SOLN: 10.0000 mg | INTRAVENOUS | Status: AC | PRN

## 2017-10-23 MED ORDER — ASPIRIN 81 MG PO CHEW
81.0000 mg | CHEWABLE_TABLET | Freq: Every day | ORAL | Status: DC
  Filled 2017-10-23: qty 1

## 2017-10-23 MED ORDER — ASPIRIN 81 MG PO CHEW
324.0000 mg | CHEWABLE_TABLET | Freq: Once | ORAL | Status: AC
  Administered 2017-10-23: 324 mg via ORAL

## 2017-10-23 MED ORDER — ACETAMINOPHEN 325 MG PO TABS
650.0000 mg | ORAL_TABLET | ORAL | Status: DC | PRN
  Administered 2017-10-23: 650 mg via ORAL
  Filled 2017-10-23: qty 2

## 2017-10-23 MED ORDER — INSULIN DETEMIR 100 UNIT/ML ~~LOC~~ SOLN
15.0000 [IU] | Freq: Two times a day (BID) | SUBCUTANEOUS | Status: DC
  Administered 2017-10-23 – 2017-10-24 (×2): 15 [IU] via SUBCUTANEOUS
  Filled 2017-10-23 (×5): qty 0.15

## 2017-10-23 MED ORDER — ASPIRIN 81 MG PO CHEW
CHEWABLE_TABLET | ORAL | Status: AC
  Filled 2017-10-23: qty 1

## 2017-10-23 MED ORDER — CLOPIDOGREL BISULFATE 75 MG PO TABS
ORAL_TABLET | ORAL | Status: AC
  Filled 2017-10-23: qty 4

## 2017-10-23 MED ORDER — ASPIRIN 81 MG PO CHEW: 81.0000 mg | CHEWABLE_TABLET | ORAL | Status: DC

## 2017-10-23 MED ORDER — FENTANYL CITRATE (PF) 100 MCG/2ML IJ SOLN
INTRAMUSCULAR | Status: AC
  Filled 2017-10-23: qty 2

## 2017-10-23 MED ORDER — SODIUM CHLORIDE 0.9 % WEIGHT BASED INFUSION
1.0000 mL/kg/h | INTRAVENOUS | Status: AC
  Administered 2017-10-23: 1 mL/kg/h via INTRAVENOUS

## 2017-10-23 MED ORDER — CLOPIDOGREL BISULFATE 75 MG PO TABS: 75.0000 mg | ORAL_TABLET | Freq: Every day | ORAL | Status: DC

## 2017-10-23 MED ORDER — SODIUM CHLORIDE 0.9 % IV SOLN
INTRAVENOUS | Status: AC | PRN
  Administered 2017-10-23: 0.25 mg/kg/h via INTRAVENOUS
  Administered 2017-10-23: 1.75 mg/kg/h via INTRAVENOUS

## 2017-10-23 MED ORDER — NITROGLYCERIN 5 MG/ML IV SOLN
INTRAVENOUS | Status: AC
  Filled 2017-10-23: qty 10

## 2017-10-23 MED ORDER — FENTANYL CITRATE (PF) 100 MCG/2ML IJ SOLN
INTRAMUSCULAR | Status: DC | PRN
  Administered 2017-10-23: 50 ug via INTRAVENOUS

## 2017-10-23 MED ORDER — ASPIRIN 81 MG PO CHEW
CHEWABLE_TABLET | ORAL | Status: AC
  Filled 2017-10-23: qty 4

## 2017-10-23 SURGICAL SUPPLY — 12 items
BALLN TREK RX 2.5X12 (BALLOONS) ×3
BALLOON TREK RX 2.5X12 (BALLOONS) ×1 IMPLANT
CATH G 6X70X100 MPA SH (CATHETERS) ×3 IMPLANT
DEVICE CLOSURE MYNXGRIP 6/7F (Vascular Products) ×3 IMPLANT
DEVICE INFLAT 30 PLUS (MISCELLANEOUS) ×3 IMPLANT
KIT MANI 3VAL PERCEP (MISCELLANEOUS) ×3 IMPLANT
NEEDLE PERC 18GX7CM (NEEDLE) ×3 IMPLANT
PACK CARDIAC CATH (CUSTOM PROCEDURE TRAY) ×3 IMPLANT
SHEATH AVANTI 6FR X 11CM (SHEATH) ×3 IMPLANT
STENT SIERRA 2.75 X 15 MM (Permanent Stent) ×3 IMPLANT
WIRE G HI TQ BMW 190 (WIRE) ×3 IMPLANT
WIRE GUIDERIGHT .035X150 (WIRE) ×3 IMPLANT

## 2017-10-23 NOTE — Progress Notes (Signed)
Patient has refused cpap. He states he is going home tomorrow

## 2017-10-23 NOTE — Progress Notes (Signed)
[facility] NAME: Marcus Williams    MR#:  [id number]  DATE OF BIRTH:  July 06, 1955  SUBJECTIVE:  CHIEF COMPLAINT: Patient had cardiac cath today  REVIEW OF SYSTEMS:  CONSTITUTIONAL: No fever, fatigue or weakness.  EYES: No blurred or double vision.  EARS, NOSE, AND THROAT: No tinnitus or ear pain.  RESPIRATORY: No cough, shortness of breath, wheezing or hemoptysis.  CARDIOVASCULAR: No chest pain, orthopnea, edema.  GASTROINTESTINAL: No nausea, vomiting, diarrhea or abdominal pain.  GENITOURINARY: No dysuria, hematuria.  ENDOCRINE: No polyuria, nocturia,  HEMATOLOGY: No anemia, easy bruising or bleeding SKIN: No rash or lesion. MUSCULOSKELETAL: rt groin pain No joint pain or arthritis.   NEUROLOGIC: No tingling, numbness, weakness.  PSYCHIATRY: No anxiety or depression.   DRUG ALLERGIES:  No Known Allergies  VITALS:  Blood pressure 132/67, pulse 74, temperature 97.9 F (36.6 C), temperature source Oral, resp. rate 18, height 5\' 8"  (1.727 m), weight 127.5 kg (281 lb), SpO2 94 %.  PHYSICAL EXAMINATION:  GENERAL:  62 y.o.-year-old patient lying in the bed with no acute distress.  EYES: Pupils equal, round, reactive to light and accommodation. No scleral icterus. Extraocular muscles intact.  HEENT: Head atraumatic, normocephalic. Oropharynx and nasopharynx clear.  NECK:  Supple, no jugular venous distention. No thyroid enlargement, no tenderness.  LUNGS: Normal breath sounds bilaterally, no wheezing, rales,rhonchi or crepitation. No use of accessory muscles of respiration.  CARDIOVASCULAR: S1, S2 normal. No murmurs, rubs, or gallops.  ABDOMEN: Soft, nontender, nondistended. Bowel sounds present. No organomegaly or mass.  EXTREMITIES: Right groin pressure dressing no pedal edema, cyanosis, or clubbing.  NEUROLOGIC: Cranial nerves II through XII are intact. Muscle strength 5/5 in all extremities. Sensation intact. Gait not  checked.  PSYCHIATRIC: The patient is alert and oriented x 3.  SKIN: No obvious rash, lesion, or ulcer.    LABORATORY PANEL:   CBC Recent Labs  Lab 10/23/17 0159  WBC 6.5  HGB 12.0*  HCT 35.3*  PLT 139*   ------------------------------------------------------------------------------------------------------------------  Chemistries  Recent Labs  Lab 10/21/17 2327  NA 137  K 3.6  CL 104  CO2 25  GLUCOSE 333*  BUN 22*  CREATININE 1.15  CALCIUM 8.5*  AST 26  ALT 27  ALKPHOS 56  BILITOT 0.6   ------------------------------------------------------------------------------------------------------------------  Cardiac Enzymes Recent Labs  Lab 10/22/17 1313  TROPONINI 1.63*   ------------------------------------------------------------------------------------------------------------------  RADIOLOGY:  Dg Chest 2 View  Result Date: 10/22/2017 CLINICAL DATA:  62 year old male with chest pain. EXAM: CHEST - 2 VIEW COMPARISON:  Chest radiograph dated 08/10/2017 FINDINGS: There has been interval resolution of the previously seen left sided pneumonia. There is no focal consolidation, pleural effusion, or pneumothorax. The cardiac silhouette is within normal limits. No acute osseous pathology. Median sternotomy wires and CABG vascular clips. IMPRESSION: No active cardiopulmonary disease. Electronically Signed   By: Anner Crete M.D.   On: 10/22/2017 00:49    EKG:   Orders placed or performed during the hospital encounter of 10/21/17  . EKG 12-Lead  . EKG 12-Lead  . ED EKG within 10 minutes  . ED EKG within 10 minutes  . EKG 12-Lead  . EKG 12-Lead  . EKG 12-Lead  . EKG 12-Lead immediately post procedure  . EKG 12-Lead  . EKG 12-Lead immediately post procedure  . EKG 12-Lead  . EKG 12-Lead    ASSESSMENT AND PLAN:     1.) CAD/NSTEMI: Pt w/ known Hx severe multivessel CAD (s/p CABG +  stents, last cath 07/2017, Cardiolgist Dr. Ubaldo Glassing), p/w 1d Hx L-sided exertional  CP improved w/ rest, concerning for stable angina.  S/p cardiac cath, s/p stent  on  beta blocker metoprolol, ACE-I. Morphine, NTG, O2.  Tele, cardiac monitoring. Dual antiplatelet therapy with  Diona Fanti and Plavix and high intensity statin   holding  Eliquis  outpatient cardiac rehab after discharge    2.) T2IDDM/hyperglycemia: Pt states he did not take his Wednesday evening dose of insulin. Glucose 333. Resume home NPH, SSI. Hold Metformin.  3.) Normocytic anemia: Hgb 12.3, MCV 88.1. Suspect anemia of chronic disease. Low suspicion for active/acute bleeding. Monitor.  4.) HTN: c/w Lisinopril, Lopressor, Torsemide.  5.) HLD/CAD/MI/CHF/ICM/SVT/AFib/CVA: c/w ASA, Plavix, Eliquis, Statin, beta blocker, ACE-I, diuretic.  6.) COPD: DuoNebs PRN.  7.) OSA: CPAP qHS (EPAP unknown).  8.) GERD: c/w Protonix.  9.) FEN/GI: Cardiac diabetic diet, Protonix.  10.) DVT PPx: . Eliquis on hold as patient scheduled for cardiac cath today SCDs meanwhile  11.) Code status: Full code.     All the records are reviewed and case discussed with Care Management/Social Workerr. Management plans discussed with the patient, family and they are in agreement.  CODE STATUS: fc  TOTAL TIME TAKING CARE OF THIS PATIENT: 14minutes.   POSSIBLE D/C IN 1-2DAYS, DEPENDING ON CLINICAL CONDITION.  Note: This dictation was prepared with Dragon dictation along with smaller phrase technology. Any transcriptional errors that result from this process are unintentional.   Marcus Williams M.D on 10/23/2017 at 2:39 PM  Between 7am to 6pm - Pager - [phone] After 6pm go to [email or web address] - password [email or web address] [facility]  Office  [phone]  CC: Primary care physician; Idelle Crouch, MD

## 2017-10-23 NOTE — Progress Notes (Signed)
Pt does not want to wear Cpap.

## 2017-10-23 NOTE — Progress Notes (Signed)
[facility] Card[facility] Encounter Note  Patient: Marcus Williams / Admit Date: 10/21/2017 / Date of Encounter: 10/23/2017, 8:16 AM   Subjective: Patient has had resolution of chest discomfort.  Troponin went to 1.5 sent with non-ST elevation myocardial infarction.  Patient on appropriate medication management for myocardial infarction Cardiac catheterization shows normal LV systolic function ejection fraction of 55% Occluded native arteries Patent graft to diagonal obtuse marginal and left anterior descending artery Significant distal stenosis of graft to right coronary artery of 80% as the culprit for non-ST elevation myocardial infarction  Review of Systems: Positive for: None Negative for: Vision change, hearing change, syncope, dizziness, nausea, vomiting,diarrhea, bloody stool, stomach pain, cough, congestion, diaphoresis, urinary frequency, urinary pain,skin lesions, skin rashes Others previously listed  Objective: Telemetry: Normal sinus rhythm Physical Exam: Blood pressure 133/65, pulse (!) 59, temperature 98.1 F (36.7 C), temperature source Oral, resp. rate 20, height 5\' 8"  (1.727 m), weight 281 lb (127.5 kg), SpO2 94 %. Body mass index is 42.73 kg/m. General: Well developed, well nourished, in no acute distress. Head: Normocephalic, atraumatic, sclera non-icteric, no xanthomas, nares are without discharge. Neck: No apparent masses Lungs: Normal respirations with no wheezes, no rhonchi, no rales , no crackles   Heart: Regular rate and rhythm, normal S1 S2, no murmur, no rub, no gallop, PMI is normal size and placement, carotid upstroke normal without bruit, jugular venous pressure normal Abdomen: Soft, non-tender, non-distended with normoactive bowel sounds. No hepatosplenomegaly. Abdominal aorta is normal size without bruit Extremities: No edema, no clubbing, no cyanosis, no ulcers,  Peripheral: 2+ radial, 2+ femoral, 2+ dorsal pedal pulses Neuro: Alert and oriented.  Moves all extremities spontaneously. Psych:  Responds to questions appropriately with a normal affect.   Intake/Output Summary (Last 24 hours) at 10/23/2017 0816 Last data filed at 10/23/2017 0401 Gross per 24 hour  Intake 89.39 ml  Output 3750 ml  Net -3660.61 ml    Inpatient Medications:  . aspirin      . [MAR Hold] aspirin  324 mg Oral Once  . aspirin  324 mg Oral Once  . [START ON 10/24/2017] aspirin  81 mg Oral Pre-Cath  . [MAR Hold] aspirin EC  81 mg Oral Daily  . [MAR Hold] baclofen  10 mg Oral BID  . clopidogrel      . clopidogrel  300 mg Oral Daily  . [MAR Hold] clopidogrel  75 mg Oral Q breakfast  . [MAR Hold] diltiazem  180 mg Oral Q12H  . [MAR Hold] DULoxetine  20 mg Oral Daily  . [MAR Hold] insulin aspart  0-15 Units Subcutaneous TID WC  . [MAR Hold] insulin aspart protamine- aspart  85 Units Subcutaneous Q breakfast   And  . [MAR Hold] insulin aspart protamine- aspart  100 Units Subcutaneous Q supper  . [MAR Hold] lisinopril  10 mg Oral Daily  . [MAR Hold] metoprolol tartrate  100 mg Oral BID  . [MAR Hold] pantoprazole  40 mg Oral Daily  . [MAR Hold] potassium chloride SA  20 mEq Oral Daily  . [MAR Hold] pramipexole  0.5 mg Oral TID  . [MAR Hold] simvastatin  40 mg Oral QHS  . sodium chloride flush  3 mL Intravenous Q12H  . [MAR Hold] torsemide  40 mg Oral Daily  . [MAR Hold] traZODone  100 mg Oral QHS  . [MAR Hold] umeclidinium-vilanterol  1 puff Inhalation Daily   Infusions:  . sodium chloride    . sodium chloride 1 mL/kg/hr (10/23/17 0754)  .  heparin 1,150 Units/hr (10/22/17 2013)    Labs: Recent Labs    10/21/17 2327  NA 137  K 3.6  CL 104  CO2 25  GLUCOSE 333*  BUN 22*  CREATININE 1.15  CALCIUM 8.5*   Recent Labs    10/21/17 2327  AST 26  ALT 27  ALKPHOS 56  BILITOT 0.6  PROT 7.1  ALBUMIN 3.7   Recent Labs    10/21/17 2327 10/23/17 0159  WBC 6.5 6.5  HGB 12.3* 12.0*  HCT 35.8* 35.3*  MCV 88.1 88.6  PLT 148* 139*   Recent  Labs    10/21/17 2327 10/22/17 0233 10/22/17 1313  TROPONINI 0.07* 0.47* 1.63*   Invalid input(s): POCBNP No results for input(s): HGBA1C in the last 72 hours.   Weights: Filed Weights   10/21/17 2325 10/22/17 0557 10/22/17 1556  Weight: 282 lb (127.9 kg) 281 lb 9.6 oz (127.7 kg) 281 lb (127.5 kg)     Radiology/Studies:  Dg Chest 2 View  Result Date: 10/22/2017 CLINICAL DATA:  62 year old male with chest pain. EXAM: CHEST - 2 VIEW COMPARISON:  Chest radiograph dated 08/10/2017 FINDINGS: There has been interval resolution of the previously seen left sided pneumonia. There is no focal consolidation, pleural effusion, or pneumothorax. The cardiac silhouette is within normal limits. No acute osseous pathology. Median sternotomy wires and CABG vascular clips. IMPRESSION: No active cardiopulmonary disease. Electronically Signed   By: Anner Crete M.D.   On: 10/22/2017 00:49   US Abdomen Complete  Result Date: 10/07/2017 CLINICAL DATA:  Diarrhea for 2 weeks. EXAM: ABDOMEN ULTRASOUND COMPLETE COMPARISON:  CT scan of March 24, 2017. FINDINGS: Gallbladder: No gallstones or wall thickening visualized. No sonographic Murphy sign noted by sonographer. Common bile duct: Diameter: 2.9 mm which is within normal limits. Liver: No focal lesion identified. Within normal limits in parenchymal echogenicity. Portal vein is patent on color Doppler imaging with normal direction of blood flow towards the liver. IVC: No abnormality visualized. Pancreas: Visualized portion unremarkable. Spleen: Size and appearance within normal limits. Right Kidney: Length: 14 cm. Echogenicity within normal limits. No mass or hydronephrosis visualized. Left Kidney: Length: 14.2 cm. Echogenicity within normal limits. No mass or hydronephrosis visualized. Abdominal aorta: No aneurysm visualized. Other findings: None. IMPRESSION: No definite abnormality seen in the abdomen. Electronically Signed   By: Marijo Conception, M.D.   On:  10/07/2017 14:35     Assessment and Recommendation  62 y.o. male with known coronary artery disease status post coronary artery bypass graft paroxysmal nonvalvular atrial fibrillation hyperlipidemia hypertension with a non-ST elevation myocardial infarction with a cardiac catheterization showing a significant 80% stenosis of the distal graft to the right coronary artery 1.  Continue heparin and dual antiplatelet therapy for distal stenosis of graft to right coronary artery 2.  PCI and stent placement of distal graft to right coronary artery today 3.  No use of Eliquis at this time due to concerns of bleeding risk until after PCI and stent placement and recovery.  At that time would use Plavix and anticoagulation without the use of aspirin 4.  Continue metoprolol and lisinopril for hypertension control as well as for maintenance of normal sinus rhythm 5.  High intensity cholesterol therapy 6.  Cardiac rehabilitation after above  Signed, Serafina Royals M.D. FACC

## 2017-10-23 NOTE — Progress Notes (Signed)
Cardiovascular and Pulmonary Nurse Navigator Note:    62 year old male with hx of CABG, paroxysmal a-fib, HTN, HLD.  Patient admitted with dx of NSTEMI.  Cardiac Cath revealed normal LV systolic function ejection fraction of 55%; occulded native arteries; patent graft to diagonal obtuse marginal and left anterior descending artery; significant distal stenosis of graft to right coronary artery of 80% as the culprit for NSTEMI.    CAD is not a new diagnosis for this patient.    Discussed modifiable risk factors including controlling blood pressure, cholesterol, and blood sugar; following heart healthy diet; maintaining healthy weight; exercise; and smoking cessation, if applicable. ?Note: Patient is a never smoker.   Discussed cardiac medications including rationale for taking, mechanisms of action, and side effects. Stressed the importance of taking medications as prescribed.   Discussed emergency plan for heart attack symptoms. Patient verbalized understanding of need to call 911 and not to drive herself to ER if having cardiac symptoms / chest pain.   Smoking Cessation - Patient is a never smoker.  Exercise - Discussed Cardiac Rehab with patient.  Patient previously was unable to participate in Cardiac Rehab due to being the primary caregiver for his wife, which is still the case. Patient declines to participate in Cardiac Rehab at this time.  Patient reports he stays active at home and does not sit around, as he is always doing something outside.    Patient thanked me for providing the above information.   Roanna Epley, RN, BSN, [facility] Cardiovascular and Pulmonary Nurse Navigator

## 2017-10-23 NOTE — Progress Notes (Addendum)
Inpatient Diabetes Program Recommendations  AACE/ADA: New Consensus Statement on Inpatient Glycemic Control (2015)  Target Ranges:  Prepandial:   less than 140 mg/dL      Peak postprandial:   less than 180 mg/dL (1-2 hours)      Critically ill patients:  140 - 180 mg/dL   Results for Marcus Williams, Marcus Williams (MRN [id number]) as of 10/23/2017 11:15  Ref. Range 10/22/2017 07:38 10/22/2017 11:47 10/22/2017 21:05 10/23/2017 07:41  Glucose-Capillary Latest Ref Range: 65 - 99 mg/dL 230 (H)  Novolog 8 units 191 (H)  Novolog 3 units 262 (H) 237 (H)   Review of Glycemic Control  Diabetes history: DM2 Outpatient Diabetes medications: 70/30 85 units QAM, 70/30 100 mg QPM, Metformin 1000 mg BID Current orders for Inpatient glycemic control:  70/30 85 units QAM, 70/30 100 mg QPM, Novolog 0-15 units TID with meals  Inpatient Diabetes Program Recommendations: Insulin - Basal: NO 70/30 was given on 10/22/17 and fasting glucose 237 mg/dl today. Please discontinue 70/30 insulin while inpatient and order Levemir 15 units BID. Insulin - Meal Coverage: If 70/30 is discontinued as recommended, please consider ordering Novolog 4 units TID with meals if patient eats at least 50% of meals. Insulin-Correction: Please consider ordering Novolog 0-5 units QHS for bedtime correction.  Thanks, Barnie Alderman, RN, MSN, CDE Diabetes Coordinator Inpatient Diabetes Program [phone] (Team Pager from 8am to 5pm)

## 2017-10-24 LAB — GLUCOSE, CAPILLARY
GLUCOSE-CAPILLARY: 210 mg/dL — AB (ref 65–99)
Glucose-Capillary: 243 mg/dL — ABNORMAL HIGH (ref 65–99)

## 2017-10-24 MED ORDER — ATORVASTATIN CALCIUM 40 MG PO TABS: 40.0000 mg | ORAL_TABLET | Freq: Every day | ORAL | 0 refills | Status: AC

## 2017-10-24 MED ORDER — ACETAMINOPHEN 325 MG PO TABS: 650.0000 mg | ORAL_TABLET | Freq: Four times a day (QID) | ORAL | Status: DC | PRN

## 2017-10-24 NOTE — Discharge Summary (Signed)
[facility] NAME: Marcus Williams    MR#:  [id number]  DATE OF BIRTH:  [birth year]  DATE OF ADMISSION:  10/21/2017 ADMITTING PHYSICIAN: Arta Silence, MD  DATE OF DISCHARGE: 10/24/17  PRIMARY CARE PHYSICIAN: Idelle Crouch, MD    ADMISSION DIAGNOSIS:  NSTEMI (non-ST elevated myocardial infarction) ([place]) [I21.4]  DISCHARGE DIAGNOSIS:  Active Problems:   NSTEMI (non-ST elevated myocardial infarction) ([place])   SECONDARY DIAGNOSIS:   Past Medical History:  Diagnosis Date  . A-fib ([place])   . Aortic stenosis, moderate 11/2015  . Arthritis   . CAD (coronary artery disease)   . Chronic systolic CHF (congestive heart failure) ([place])   . Diabetes mellitus without complication ([place])    INSULIN DEPENDENT  . GERD (gastroesophageal reflux disease)   . History of cardioversion   . Hypertension   . MI (myocardial infarction) ([place])   . OSA on CPAP   . Sepsis ([place])   . Shortness of breath dyspnea   . Stroke ([place])   . SVT (supraventricular tachycardia) [facility])     HOSPITAL COURSE:  Octavian Godek  is a 62 y.o. male with an extensive past medical history, including but not limited to morbid obesity, T2IDDM (w/ retinopathy + neuropathy), HTN, HLD, CAD/MI (s/p CABG x4, multiple stents, last Cath 02/3234), chronic systolic CHF/ICM (EF 57-32% as of 07/29/2017 Echo), Hx SVT/AFib (Eliquis), CVA, COPD, OSA (CPAP qHS), GERD, DJD/OA who p/w 1d Hx CP. Pt has a storied cardiac history, including CABG and multiple stents. His Cardiologist is Dr. Ubaldo Glassing. He endorses medication adherence, but states he has been off Eliquis since Tuesday because he is scheduled to have back injections for chronic lower back pain.  Pt states that he felt well prior to Wednesday (10/21/2017). He states that on Wednesday evening, @~1700PM, he began to experience CP while sitting on the sofa watching television. He characterizes the chest pain as being mild in severity  initially, L-sided (above the L breast), sharp/stabbing, exertional, non-positional, non-pleuritic, non-reproducible w/ palpation, (+) radiation to LUE (L arm numbness w/o pain), (-) associated N/V/palpitations/diaphoresis/LH/LOC. As the pain was initially minimal, he believed it was heartburn. He states he was getting ready to go to church when the pain started, but he went to church anyway. He states that when he got out of the car to walk into the church, his CP got worse, and improved when he sat down and rested. He states that he went home after church. His wife is in a wheelchair, and he states he pushed his wife up the wheelchair ramp into the house. While doing this, he states his chest pain got significantly worse. He took NTG x2, w/ no improvement in symptoms. He called his nephew, who drove him to the ED. He states that his CP resolved while in the ED, and he is CP-free, well-appearing and in no acute distress at the time of my Hx/examination. He states the CP he experienced on Wednesday was different from the CP he has experienced in the past in the setting of CAD/MI, though he was still worried given his Hx, prompting him to seek medical attention. He does make mention of some mild SOB that he stated occurred on waking up from sleep on Wednesday morning (relieved w/ a breathing treatment), but he denies SOB associated w/ his CP. He denies F/C, diarrhea/AP, cough, hemoptysis, wheezing, night sweats, rigors, weight loss, HA, vertigo, blurred vision, urinary symptoms.     1.) CAD/NSTEMI:  Pt w/ known Hx severe multivessel CAD (s/p CABG + stents, last cath 07/2017, Cardiolgist Dr. Ubaldo Glassing), p/w 1d Hx L-sided exertional CP improved w/ rest, concerning for stable angina.  S/p cardiac cath, s/p stent  on  beta blocker metoprolol, ACE-I. Morphine, NTG, weaned off O2.  Tele, cardiac monitoring. Dual antiplatelet therapy with  eliquis and Plavix and high intensity statin   outpatient cardiac rehab after  discharge Okay to discharge patient from cardiology standpoint    2.) T2IDDM/hyperglycemia: Pt states he did not take his Wednesday evening dose of insulin. Glucose 333. Resume home NPH, SSI. Hold Metformin.  3.) Normocytic anemia: Hgb 12.3, MCV 88.1. Suspect anemia of chronic disease. Low suspicion for active/acute bleeding. Monitor.  4.) HTN: c/w Lisinopril, Lopressor, Torsemide.  5.) HLD/CAD/MI/CHF/ICM/SVT/AFib/CVA: c/w ASA, Plavix, Eliquis, Statin, beta blocker, ACE-I, diuretic.  6.) COPD: DuoNebs PRN.  7.) OSA: CPAP qHS (EPAP unknown).  8.) GERD: c/w Protonix.  9.) FEN/GI: Cardiac diabetic diet, Protonix.  10.) DVT PPx: . Eliquis on hold as patient scheduled for cardiac cath today SCDs meanwhile  11.) Code status: Full code.    DISCHARGE CONDITIONS:   Stable   CONSULTS OBTAINED:  Treatment Team:  Arta Silence, MD Corey Skains, MD   PROCEDURES left heart cardiac cath and stent placement  DRUG ALLERGIES:  No Known Allergies  DISCHARGE MEDICATIONS:   Allergies as of 10/24/2017   No Known Allergies     Medication List    STOP taking these medications   simvastatin 40 MG tablet Commonly known as:  ZOCOR     TAKE these medications   acetaminophen 325 MG tablet Commonly known as:  TYLENOL Take 2 tablets (650 mg total) by mouth every 6 (six) hours as needed for mild pain or headache.   albuterol 108 (90 Base) MCG/ACT inhaler Commonly known as:  PROVENTIL HFA;VENTOLIN HFA Inhale 2 puffs into the lungs every 6 (six) hours as needed for wheezing or shortness of breath. Notes to patient:  NONE TODAY   amoxicillin 875 MG tablet Commonly known as:  AMOXIL Take 875 mg by mouth 2 (two) times daily.   apixaban 5 MG Tabs tablet Commonly known as:  ELIQUIS Take 5 mg by mouth 2 (two) times daily.   atorvastatin 40 MG tablet Commonly known as:  LIPITOR Take 1 tablet (40 mg total) by mouth daily at 6 PM.   baclofen 10 MG  tablet Commonly known as:  LIORESAL Take 10 mg by mouth 2 (two) times daily as needed for muscle spasms. Notes to patient:  YOU HAD A DOSE THIS MORNING.   clopidogrel 75 MG tablet Commonly known as:  PLAVIX Take 1 tablet (75 mg total) by mouth daily with breakfast.   diltiazem 180 MG 24 hr capsule Commonly known as:  CARDIZEM CD Take 180 mg by mouth 2 (two) times daily.   DULoxetine 20 MG capsule Commonly known as:  CYMBALTA Take 20 mg by mouth daily.   Fluticasone-Salmeterol 250-50 MCG/DOSE Aepb Commonly known as:  ADVAIR Inhale 1 puff into the lungs 2 (two) times daily.   insulin NPH-regular Human (70-30) 100 UNIT/ML injection Commonly known as:  NOVOLIN 70/30 Inject 85-100 Units into the skin 2 (two) times daily with a meal. 85 units in the morning and 100 units in the evening   lisinopril 10 MG tablet Commonly known as:  PRINIVIL,ZESTRIL Take 1 tablet (10 mg total) by mouth daily.   metFORMIN 1000 MG tablet Commonly known as:  GLUCOPHAGE Take 1,000 mg by  mouth 2 (two) times daily with a meal.   metoprolol tartrate 100 MG tablet Commonly known as:  LOPRESSOR Take 100 mg by mouth 2 (two) times daily.   nitroGLYCERIN 0.4 MG SL tablet Commonly known as:  NITROSTAT Place 0.4 mg under the tongue every 5 (five) minutes as needed for chest pain.   pantoprazole 40 MG tablet Commonly known as:  PROTONIX Take 40 mg by mouth daily.   Potassium Chloride ER 20 MEQ Tbcr Take 20 mEq by mouth daily.   pramipexole 0.5 MG tablet Commonly known as:  MIRAPEX Take 0.5 mg by mouth 3 (three) times daily as needed.   predniSONE 10 MG tablet Commonly known as:  DELTASONE Take 10-40 mg by mouth daily with breakfast. 40 mg x2 days, 30 mg x2 day, 20 mg x2 days, 10 mg x2 days   torsemide 20 MG tablet Commonly known as:  DEMADEX Take 2 tablets (40 mg total) by mouth daily.        DISCHARGE INSTRUCTIONS:   Follow-up with primary care physician in a week Follow-up with  cardiology Dr. Nehemiah Massed in a week Follow-up with outpatient cardiac rehab in 2 to 3 days  DIET:  Cardiac diet , diabetic   DISCHARGE CONDITION:  Stable  ACTIVITY:  Activity as tolerated  OXYGEN:  Home Oxygen: No.   Oxygen Delivery: room air  DISCHARGE LOCATION:  home   If you experience worsening of your admission symptoms, develop shortness of breath, life threatening emergency, suicidal or homicidal thoughts you must seek medical attention immediately by calling 911 or calling your MD immediately  if symptoms less severe.  You Must read complete instructions/literature along with all the possible adverse reactions/side effects for all the Medicines you take and that have been prescribed to you. Take any new Medicines after you have completely understood and accpet all the possible adverse reactions/side effects.   Please note  You were cared for by a hospitalist during your hospital stay. If you have any questions about your discharge medications or the care you received while you were in the hospital after you are discharged, you can call the unit and asked to speak with the hospitalist on call if the hospitalist that took care of you is not available. Once you are discharged, your primary care physician will handle any further medical issues. Please note that NO REFILLS for any discharge medications will be authorized once you are discharged, as it is imperative that you return to your primary care physician (or establish a relationship with a primary care physician if you do not have one) for your aftercare needs so that they can reassess your need for medications and monitor your lab values.     Today  Chief Complaint  Patient presents with  . Chest Pain    Patient is doing okay.  Okay to discharge patient from cardiology standpoint ROS:  CONSTITUTIONAL: Denies fevers, chills. Denies any fatigue, weakness.  EYES: Denies blurry vision, double vision, eye pain. EARS,  NOSE, THROAT: Denies tinnitus, ear pain, hearing loss. RESPIRATORY: Denies cough, wheeze, shortness of breath.  CARDIOVASCULAR: Denies chest pain, palpitations, edema.  GASTROINTESTINAL: Denies nausea, vomiting, diarrhea, abdominal pain. Denies bright red blood per rectum. GENITOURINARY: Denies dysuria, hematuria. ENDOCRINE: Denies nocturia or thyroid problems. HEMATOLOGIC AND LYMPHATIC: Denies easy bruising or bleeding. SKIN: Denies rash or lesion. MUSCULOSKELETAL: Groin area healing well denies pain in neck, back, shoulder, knees, hips or arthritic symptoms.  NEUROLOGIC: Denies paralysis, paresthesias.  PSYCHIATRIC: Denies anxiety or depressive  symptoms.   VITAL SIGNS:  Blood pressure (!) 149/73, pulse 65, temperature 98.4 F (36.9 C), temperature source Oral, resp. rate 18, height 5\' 8"  (1.727 m), weight 127.5 kg (281 lb), SpO2 97 %.  I/O:    Intake/Output Summary (Last 24 hours) at 10/24/2017 1329 Last data filed at 10/24/2017 0836 Gross per 24 hour  Intake 400 ml  Output 2700 ml  Net -2300 ml    PHYSICAL EXAMINATION:  GENERAL:  62 y.o.-year-old patient lying in the bed with no acute distress.  EYES: Pupils equal, round, reactive to light and accommodation. No scleral icterus. Extraocular muscles intact.  HEENT: Head atraumatic, normocephalic. Oropharynx and nasopharynx clear.  NECK:  Supple, no jugular venous distention. No thyroid enlargement, no tenderness.  LUNGS: Normal breath sounds bilaterally, no wheezing, rales,rhonchi or crepitation. No use of accessory muscles of respiration.  CARDIOVASCULAR: S1, S2 normal. No murmurs, rubs, or gallops.  ABDOMEN: Soft, non-tender, non-distended. Bowel sounds present. No organomegaly or mass.  EXTREMITIES: Groin site in the cardiac cath area looks clean no pedal edema, cyanosis, or clubbing.  NEUROLOGIC: Cranial nerves II through XII are intact. Muscle strength 5/5 in all extremities. Sensation intact. Gait not checked.  PSYCHIATRIC:  The patient is alert and oriented x 3.  SKIN: No obvious rash, lesion, or ulcer.   DATA REVIEW:   CBC Recent Labs  Lab 10/23/17 0159  WBC 6.5  HGB 12.0*  HCT 35.3*  PLT 139*    Chemistries  Recent Labs  Lab 10/21/17 2327  NA 137  K 3.6  CL 104  CO2 25  GLUCOSE 333*  BUN 22*  CREATININE 1.15  CALCIUM 8.5*  AST 26  ALT 27  ALKPHOS 56  BILITOT 0.6    Cardiac Enzymes Recent Labs  Lab 10/22/17 1313  TROPONINI 1.63*    Microbiology Results  Results for orders placed or performed during the hospital encounter of 08/10/17  Blood Culture (routine x 2)     Status: None   Collection Time: 08/10/17  7:25 PM  Result Value Ref Range Status   Specimen Description BLOOD LEFT HAND  Final   Special Requests   Final    BOTTLES DRAWN AEROBIC AND ANAEROBIC Blood Culture adequate volume   Culture   Final    NO GROWTH 5 DAYS Performed at [facility], [address] [place], [place] [address]    Report Status 08/15/2017 FINAL  Final  Blood Culture (routine x 2)     Status: None   Collection Time: 08/10/17  7:33 PM  Result Value Ref Range Status   Specimen Description BLOOD LEFT ANTECUBITAL  Final   Special Requests   Final    BOTTLES DRAWN AEROBIC AND ANAEROBIC Blood Culture adequate volume   Culture   Final    NO GROWTH 5 DAYS Performed at [facility], [address] [place], [place] [address]    Report Status 08/15/2017 FINAL  Final  MRSA PCR Screening     Status: None   Collection Time: 08/11/17  8:43 AM  Result Value Ref Range Status   MRSA by PCR NEGATIVE NEGATIVE Final    Comment:        The GeneXpert MRSA Assay (FDA approved for NASAL specimens only), is one component of a comprehensive MRSA colonization surveillance program. It is not intended to diagnose MRSA infection nor to guide or monitor treatment for MRSA infections. Performed at [facility], [address] [place], [place] [address]  RADIOLOGY:  Dg Chest  2 View  Result Date: 10/22/2017 CLINICAL DATA:  62 year old male with chest pain. EXAM: CHEST - 2 VIEW COMPARISON:  Chest radiograph dated 08/10/2017 FINDINGS: There has been interval resolution of the previously seen left sided pneumonia. There is no focal consolidation, pleural effusion, or pneumothorax. The cardiac silhouette is within normal limits. No acute osseous pathology. Median sternotomy wires and CABG vascular clips. IMPRESSION: No active cardiopulmonary disease. Electronically Signed   By: Anner Crete M.D.   On: 10/22/2017 00:49    EKG:   Orders placed or performed during the hospital encounter of 10/21/17  . EKG 12-Lead  . EKG 12-Lead  . ED EKG within 10 minutes  . ED EKG within 10 minutes  . EKG 12-Lead  . EKG 12-Lead  . EKG 12-Lead  . EKG 12-Lead immediately post procedure  . EKG 12-Lead  . EKG 12-Lead immediately post procedure  . EKG 12-Lead  . EKG 12-Lead  . EKG 12-Lead      Management plans discussed with the patient, family and they are in agreement.  CODE STATUS:     Code Status Orders  (From admission, onward)        Start     Ordered   10/22/17 0552  Full code  Continuous     10/22/17 0551    Code Status History    Date Active Date Inactive Code Status Order ID Comments User Context   08/10/2017 2205 08/14/2017 1512 Full Code [id number]  Vaughan Basta, MD Inpatient   07/31/2017 2045 08/02/2017 1648 Full Code [id number]  Henreitta Leber, MD Inpatient   07/28/2017 2230 07/31/2017 1428 Full Code [id number]  Dustin Flock, MD Inpatient   03/01/2017 0203 03/01/2017 2004 Full Code [id number]  HugelmeyerUbaldo Glassing, DO Inpatient   11/21/2015 1620 11/23/2015 1710 Full Code [id number]  Sherren Mocha, MD Inpatient   05/06/2015 1545 104/11/2014 1609 Full Code [id number]  Sharlotte Alamo, MD Inpatient   05/04/2015 1951 05/06/2015 1545 Full Code [id number]  Vaughan Basta, MD ED   01/14/2015 1416 01/15/2015 1514 Full Code [id number]  Lance Coon, MD Inpatient    12/22/2014 1825 12/23/2014 1612 Full Code [id number]  Epifanio Lesches, MD ED   10/14/2014 1214 10/15/2014 1350 Full Code [id number]  Alfonso Patten, RN Inpatient    Advance Directive Documentation     Most Recent Value  Type of Advance Directive  Healthcare Power of Attorney, Living will  Pre-existing out of facility DNR order (yellow form or pink MOST form)  -  "MOST" Form in Place?  -      TOTAL TIME TAKING CARE OF THIS PATIENT: 43  minutes.   Note: This dictation was prepared with Dragon dictation along with smaller phrase technology. Any transcriptional errors that result from this process are unintentional.   @[facility] @  on 10/24/2017 at 1:29 PM  Between 7am to 6pm - Pager - [phone]  After 6pm go to [email or web address] - password [email or web address] [facility]  Office  [phone]  CC: Primary care physician; Idelle Crouch, MD

## 2017-10-24 NOTE — Progress Notes (Signed)
[facility] Cardiology [facility] Encounter Note  Patient: Marcus Williams / Admit Date: 10/21/2017 / Date of Encounter: 10/24/2017, 7:11 AM   Subjective: Patient has had resolution of chest discomfort.  Troponin went to 1.5 sent with non-ST elevation myocardial infarction.  Patient on appropriate medication management for myocardial infarction and tolerating well.  Some shortness of breath with physical activity possibly from volume overload from cardiac catheterization slightly improved this morning Cardiac catheterization shows normal LV systolic function ejection fraction of 55% Occluded native arteries Patent graft to diagonal obtuse marginal and left anterior descending artery Significant distal stenosis of graft to right coronary artery of 80% as the culprit for non-ST elevation myocardial infarction  Review of Systems: Positive for: Shortness of breath Negative for: Vision change, hearing change, syncope, dizziness, nausea, vomiting,diarrhea, bloody stool, stomach pain, cough, congestion, diaphoresis, urinary frequency, urinary pain,skin lesions, skin rashes Others previously listed  Objective: Telemetry: Normal sinus rhythm Physical Exam: Blood pressure 140/67, pulse (!) 57, temperature 98.6 F (37 C), temperature source Oral, resp. rate (!) 24, height 5\' 8"  (1.727 m), weight 281 lb (127.5 kg), SpO2 93 %. Body mass index is 42.73 kg/m. General: Well developed, well nourished, in no acute distress. Head: Normocephalic, atraumatic, sclera non-icteric, no xanthomas, nares are without discharge. Neck: No apparent masses Lungs: Normal respirations with no wheezes, no rhonchi, no rales , no crackles   Heart: Regular rate and rhythm, normal S1 S2, no murmur, no rub, no gallop, PMI is normal size and placement, carotid upstroke normal without bruit, jugular venous pressure normal Abdomen: Soft, non-tender, non-distended with normoactive bowel sounds. No hepatosplenomegaly. Abdominal aorta is  normal size without bruit Extremities: No edema, no clubbing, no cyanosis, no ulcers,  Peripheral: 2+ radial, 2+ femoral, 2+ dorsal pedal pulses Neuro: Alert and oriented. Moves all extremities spontaneously. Psych:  Responds to questions appropriately with a normal affect.   Intake/Output Summary (Last 24 hours) at 10/24/2017 0711 Last data filed at 10/24/2017 0602 Gross per 24 hour  Intake 400 ml  Output 3100 ml  Net -2700 ml    Inpatient Medications:  . aspirin  324 mg Oral Once  . aspirin  81 mg Oral Daily  . atorvastatin  40 mg Oral q1800  . baclofen  10 mg Oral BID  . clopidogrel  300 mg Oral Daily  . clopidogrel  75 mg Oral Q breakfast  . diltiazem  180 mg Oral Q12H  . DULoxetine  20 mg Oral Daily  . insulin aspart  0-15 Units Subcutaneous TID WC  . insulin aspart  0-5 Units Subcutaneous QHS  . insulin aspart  4 Units Subcutaneous TID WC  . insulin detemir  15 Units Subcutaneous BID  . lisinopril  10 mg Oral Daily  . metoprolol tartrate  100 mg Oral BID  . pantoprazole  40 mg Oral Daily  . potassium chloride SA  20 mEq Oral Daily  . pramipexole  0.5 mg Oral TID  . sodium chloride flush  3 mL Intravenous Q12H  . torsemide  40 mg Oral Daily  . traZODone  100 mg Oral QHS  . umeclidinium-vilanterol  1 puff Inhalation Daily   Infusions:  . sodium chloride      Labs: Recent Labs    10/21/17 2327  NA 137  K 3.6  CL 104  CO2 25  GLUCOSE 333*  BUN 22*  CREATININE 1.15  CALCIUM 8.5*   Recent Labs    10/21/17 2327  AST 26  ALT 27  ALKPHOS  56  BILITOT 0.6  PROT 7.1  ALBUMIN 3.7   Recent Labs    10/21/17 2327 10/23/17 0159  WBC 6.5 6.5  HGB 12.3* 12.0*  HCT 35.8* 35.3*  MCV 88.1 88.6  PLT 148* 139*   Recent Labs    10/21/17 2327 10/22/17 0233 10/22/17 1313  TROPONINI 0.07* 0.47* 1.63*   Invalid input(s): POCBNP No results for input(s): HGBA1C in the last 72 hours.   Weights: Filed Weights   10/21/17 2325 10/22/17 0557 10/22/17 1556   Weight: 282 lb (127.9 kg) 281 lb 9.6 oz (127.7 kg) 281 lb (127.5 kg)     Radiology/Studies:  Dg Chest 2 View  Result Date: 10/22/2017 CLINICAL DATA:  62 year old male with chest pain. EXAM: CHEST - 2 VIEW COMPARISON:  Chest radiograph dated 08/10/2017 FINDINGS: There has been interval resolution of the previously seen left sided pneumonia. There is no focal consolidation, pleural effusion, or pneumothorax. The cardiac silhouette is within normal limits. No acute osseous pathology. Median sternotomy wires and CABG vascular clips. IMPRESSION: No active cardiopulmonary disease. Electronically Signed   By: Anner Crete M.D.   On: 10/22/2017 00:49   US Abdomen Complete  Result Date: 10/07/2017 CLINICAL DATA:  Diarrhea for 2 weeks. EXAM: ABDOMEN ULTRASOUND COMPLETE COMPARISON:  CT scan of March 24, 2017. FINDINGS: Gallbladder: No gallstones or wall thickening visualized. No sonographic Murphy sign noted by sonographer. Common bile duct: Diameter: 2.9 mm which is within normal limits. Liver: No focal lesion identified. Within normal limits in parenchymal echogenicity. Portal vein is patent on color Doppler imaging with normal direction of blood flow towards the liver. IVC: No abnormality visualized. Pancreas: Visualized portion unremarkable. Spleen: Size and appearance within normal limits. Right Kidney: Length: 14 cm. Echogenicity within normal limits. No mass or hydronephrosis visualized. Left Kidney: Length: 14.2 cm. Echogenicity within normal limits. No mass or hydronephrosis visualized. Abdominal aorta: No aneurysm visualized. Other findings: None. IMPRESSION: No definite abnormality seen in the abdomen. Electronically Signed   By: Marijo Conception, M.D.   On: 10/07/2017 14:35     Assessment and Recommendation  62 y.o. male with known coronary artery disease status post coronary artery bypass graft paroxysmal nonvalvular atrial fibrillation hyperlipidemia hypertension with a non-ST elevation  myocardial infarction with a cardiac catheterization showing a significant 80% stenosis of the distal graft to the right coronary artery now significantly improved after PCI and stent placement without complication 1.  Continue  dual antiplatelet therapy for distal stenosis of graft to right coronary artery 2.  No further cardiac intervention at this time 3.  Okay for reinstatement of Eliquis on discharge for further risk reduction in stroke with atrial fibrillation with discontinuation of aspirin to reduce possibility of concerns or significant bleeding complications 4.  Continue metoprolol and lisinopril for hypertension control as well as for maintenance of normal sinus rhythm 5.  High intensity cholesterol therapy 6.  Cardiac rehabilitation 7.  Okay for discharge home from cardiac standpoint if ambulating well with follow-up next week for adjustments of medication  Signed, Serafina Royals M.D. FACC

## 2017-10-24 NOTE — Discharge Instructions (Signed)
Follow-up with primary care physician in a week Follow-up with cardiology Dr. Nehemiah Massed in a week Follow-up with outpatient cardiac rehab in 2 to 3 days

## 2017-10-28 ENCOUNTER — Emergency Department: Payer: Medicare HMO

## 2017-10-28 ENCOUNTER — Encounter
Admission: EM | Disposition: A | Discharge status: Home / Self Care | Payer: Self-pay | Source: Home / Self Care | Attending: Internal Medicine

## 2017-10-28 ENCOUNTER — Inpatient Hospital Stay
Admission: EM | Admit: 2017-10-28 | Discharge: 2017-11-01 | DRG: 280 | Disposition: A | Discharge status: Home / Self Care | Payer: Medicare HMO | Attending: Internal Medicine | Admitting: Internal Medicine

## 2017-10-28 ENCOUNTER — Other Ambulatory Visit: Payer: Self-pay

## 2017-10-28 DIAGNOSIS — I214 Non-ST elevation (NSTEMI) myocardial infarction: Principal | ICD-10-CM | POA: Diagnosis present

## 2017-10-28 DIAGNOSIS — R000 Tachycardia, unspecified: Secondary | ICD-10-CM | POA: Diagnosis not present

## 2017-10-28 DIAGNOSIS — G4733 Obstructive sleep apnea (adult) (pediatric): Secondary | ICD-10-CM | POA: Diagnosis present

## 2017-10-28 DIAGNOSIS — Z955 Presence of coronary angioplasty implant and graft: Secondary | ICD-10-CM | POA: Diagnosis not present

## 2017-10-28 DIAGNOSIS — E119 Type 2 diabetes mellitus without complications: Secondary | ICD-10-CM | POA: Diagnosis present

## 2017-10-28 DIAGNOSIS — Z8679 Personal history of other diseases of the circulatory system: Secondary | ICD-10-CM

## 2017-10-28 DIAGNOSIS — R21 Rash and other nonspecific skin eruption: Secondary | ICD-10-CM | POA: Diagnosis not present

## 2017-10-28 DIAGNOSIS — I350 Nonrheumatic aortic (valve) stenosis: Secondary | ICD-10-CM | POA: Diagnosis present

## 2017-10-28 DIAGNOSIS — I252 Old myocardial infarction: Secondary | ICD-10-CM

## 2017-10-28 DIAGNOSIS — J449 Chronic obstructive pulmonary disease, unspecified: Secondary | ICD-10-CM | POA: Diagnosis present

## 2017-10-28 DIAGNOSIS — Y840 Cardiac catheterization as the cause of abnormal reaction of the patient, or of later complication, without mention of misadventure at the time of the procedure: Secondary | ICD-10-CM | POA: Diagnosis not present

## 2017-10-28 DIAGNOSIS — T508X5A Adverse effect of diagnostic agents, initial encounter: Secondary | ICD-10-CM | POA: Diagnosis present

## 2017-10-28 DIAGNOSIS — E1165 Type 2 diabetes mellitus with hyperglycemia: Secondary | ICD-10-CM | POA: Diagnosis not present

## 2017-10-28 DIAGNOSIS — M1990 Unspecified osteoarthritis, unspecified site: Secondary | ICD-10-CM | POA: Diagnosis present

## 2017-10-28 DIAGNOSIS — Z89421 Acquired absence of other right toe(s): Secondary | ICD-10-CM

## 2017-10-28 DIAGNOSIS — R402414 Glasgow coma scale score 13-15, 24 hours or more after hospital admission: Secondary | ICD-10-CM | POA: Diagnosis not present

## 2017-10-28 DIAGNOSIS — Z951 Presence of aortocoronary bypass graft: Secondary | ICD-10-CM | POA: Diagnosis not present

## 2017-10-28 DIAGNOSIS — Y92238 Other place in hospital as the place of occurrence of the external cause: Secondary | ICD-10-CM | POA: Diagnosis present

## 2017-10-28 DIAGNOSIS — Z8673 Personal history of transient ischemic attack (TIA), and cerebral infarction without residual deficits: Secondary | ICD-10-CM

## 2017-10-28 DIAGNOSIS — I213 ST elevation (STEMI) myocardial infarction of unspecified site: Secondary | ICD-10-CM

## 2017-10-28 DIAGNOSIS — I25119 Atherosclerotic heart disease of native coronary artery with unspecified angina pectoris: Secondary | ICD-10-CM | POA: Diagnosis present

## 2017-10-28 DIAGNOSIS — I959 Hypotension, unspecified: Secondary | ICD-10-CM | POA: Diagnosis not present

## 2017-10-28 DIAGNOSIS — T888XXA Other specified complications of surgical and medical care, not elsewhere classified, initial encounter: Secondary | ICD-10-CM | POA: Diagnosis present

## 2017-10-28 DIAGNOSIS — R0600 Dyspnea, unspecified: Secondary | ICD-10-CM

## 2017-10-28 DIAGNOSIS — Z9989 Dependence on other enabling machines and devices: Secondary | ICD-10-CM

## 2017-10-28 DIAGNOSIS — Z6841 Body Mass Index (BMI) 40.0 and over, adult: Secondary | ICD-10-CM

## 2017-10-28 DIAGNOSIS — Z91041 Radiographic dye allergy status: Secondary | ICD-10-CM

## 2017-10-28 DIAGNOSIS — Z7902 Long term (current) use of antithrombotics/antiplatelets: Secondary | ICD-10-CM

## 2017-10-28 DIAGNOSIS — Z888 Allergy status to other drugs, medicaments and biological substances status: Secondary | ICD-10-CM

## 2017-10-28 DIAGNOSIS — D649 Anemia, unspecified: Secondary | ICD-10-CM | POA: Diagnosis not present

## 2017-10-28 DIAGNOSIS — Z79899 Other long term (current) drug therapy: Secondary | ICD-10-CM

## 2017-10-28 DIAGNOSIS — R0602 Shortness of breath: Secondary | ICD-10-CM | POA: Diagnosis not present

## 2017-10-28 DIAGNOSIS — Z794 Long term (current) use of insulin: Secondary | ICD-10-CM

## 2017-10-28 DIAGNOSIS — J9600 Acute respiratory failure, unspecified whether with hypoxia or hypercapnia: Secondary | ICD-10-CM | POA: Diagnosis not present

## 2017-10-28 DIAGNOSIS — T380X5A Adverse effect of glucocorticoids and synthetic analogues, initial encounter: Secondary | ICD-10-CM | POA: Diagnosis not present

## 2017-10-28 DIAGNOSIS — I5043 Acute on chronic combined systolic (congestive) and diastolic (congestive) heart failure: Secondary | ICD-10-CM | POA: Diagnosis present

## 2017-10-28 DIAGNOSIS — I480 Paroxysmal atrial fibrillation: Secondary | ICD-10-CM | POA: Diagnosis present

## 2017-10-28 DIAGNOSIS — Y92230 Patient room in hospital as the place of occurrence of the external cause: Secondary | ICD-10-CM | POA: Diagnosis not present

## 2017-10-28 DIAGNOSIS — R079 Chest pain, unspecified: Secondary | ICD-10-CM | POA: Diagnosis not present

## 2017-10-28 DIAGNOSIS — I110 Hypertensive heart disease with heart failure: Secondary | ICD-10-CM | POA: Diagnosis present

## 2017-10-28 DIAGNOSIS — Z7901 Long term (current) use of anticoagulants: Secondary | ICD-10-CM

## 2017-10-28 DIAGNOSIS — K219 Gastro-esophageal reflux disease without esophagitis: Secondary | ICD-10-CM | POA: Diagnosis present

## 2017-10-28 DIAGNOSIS — J9601 Acute respiratory failure with hypoxia: Secondary | ICD-10-CM | POA: Diagnosis not present

## 2017-10-28 DIAGNOSIS — I9581 Postprocedural hypotension: Secondary | ICD-10-CM | POA: Diagnosis not present

## 2017-10-28 LAB — COMPREHENSIVE METABOLIC PANEL
ALK PHOS: 66 U/L (ref 38–126)
ALT: 39 U/L (ref 17–63)
AST: 46 U/L — AB (ref 15–41)
Albumin: 3.7 g/dL (ref 3.5–5.0)
Anion gap: 12 (ref 5–15)
BUN: 24 mg/dL — AB (ref 6–20)
CALCIUM: 9.3 mg/dL (ref 8.9–10.3)
CO2: 20 mmol/L — ABNORMAL LOW (ref 22–32)
CREATININE: 1.04 mg/dL (ref 0.61–1.24)
Chloride: 105 mmol/L (ref 101–111)
Glucose, Bld: 140 mg/dL — ABNORMAL HIGH (ref 65–99)
Potassium: 3.8 mmol/L (ref 3.5–5.1)
Sodium: 137 mmol/L (ref 135–145)
Total Bilirubin: 0.7 mg/dL (ref 0.3–1.2)
Total Protein: 7.5 g/dL (ref 6.5–8.1)

## 2017-10-28 LAB — LIPID PANEL
CHOLESTEROL: 140 mg/dL (ref 0–200)
HDL: 34 mg/dL — ABNORMAL LOW (ref >40)
LDL CALC: 77 mg/dL (ref 0–99)
TRIGLYCERIDES: 146 mg/dL (ref <150)
Total CHOL/HDL Ratio: 4.1 RATIO
VLDL: 29 mg/dL (ref 0–40)

## 2017-10-28 LAB — TROPONIN I
Troponin I: 0.51 ng/mL (ref <0.03)
Troponin I: 2.69 ng/mL (ref <0.03)

## 2017-10-28 LAB — CBC WITH DIFFERENTIAL/PLATELET
Basophils Absolute: 0.1 10*3/uL (ref 0–0.1)
Basophils Relative: 1 %
EOS ABS: 0 10*3/uL (ref 0–0.7)
EOS PCT: 0 %
HCT: 34.7 % — ABNORMAL LOW (ref 40.0–52.0)
Hemoglobin: 11.9 g/dL — ABNORMAL LOW (ref 13.0–18.0)
LYMPHS ABS: 0.6 10*3/uL — AB (ref 1.0–3.6)
LYMPHS PCT: 6 %
MCH: 30.3 pg (ref 26.0–34.0)
MCHC: 34.4 g/dL (ref 32.0–36.0)
MCV: 87.9 fL (ref 80.0–100.0)
MONO ABS: 0.4 10*3/uL (ref 0.2–1.0)
MONOS PCT: 4 %
Neutro Abs: 9.5 10*3/uL — ABNORMAL HIGH (ref 1.4–6.5)
Neutrophils Relative %: 89 %
PLATELETS: 220 10*3/uL (ref 150–440)
RBC: 3.94 MIL/uL — AB (ref 4.40–5.90)
RDW: 15 % — AB (ref 11.5–14.5)
WBC: 10.6 10*3/uL (ref 3.8–10.6)

## 2017-10-28 LAB — APTT
aPTT: 34 seconds (ref 24–36)
aPTT: 41 seconds — ABNORMAL HIGH (ref 24–36)

## 2017-10-28 LAB — GLUCOSE, CAPILLARY
Glucose-Capillary: 147 mg/dL — ABNORMAL HIGH (ref 65–99)
Glucose-Capillary: 134 mg/dL — ABNORMAL HIGH (ref 65–99)

## 2017-10-28 LAB — PROTIME-INR
INR: 1.03
Prothrombin Time: 13.4 seconds (ref 11.4–15.2)

## 2017-10-28 LAB — HEPARIN LEVEL (UNFRACTIONATED): HEPARIN UNFRACTIONATED: 0.82 [IU]/mL — AB (ref 0.30–0.70)

## 2017-10-28 SURGERY — CORONARY/GRAFT ACUTE MI REVASCULARIZATION
Anesthesia: Moderate Sedation

## 2017-10-28 MED ORDER — BACLOFEN 10 MG PO TABS
10.0000 mg | ORAL_TABLET | Freq: Two times a day (BID) | ORAL | Status: DC | PRN
Start: 2017-10-28 — End: 2017-11-01
  Filled 2017-10-28: qty 1

## 2017-10-28 MED ORDER — ONDANSETRON HCL 4 MG/2ML IJ SOLN: 4.0000 mg | Freq: Four times a day (QID) | INTRAMUSCULAR | Status: DC | PRN

## 2017-10-28 MED ORDER — PRAMIPEXOLE DIHYDROCHLORIDE 0.25 MG PO TABS
0.5000 mg | ORAL_TABLET | Freq: Three times a day (TID) | ORAL | Status: DC
  Administered 2017-10-28 – 2017-10-29 (×2): 0.5 mg via ORAL
  Filled 2017-10-28 (×2): qty 2

## 2017-10-28 MED ORDER — TORSEMIDE 20 MG PO TABS
40.0000 mg | ORAL_TABLET | Freq: Every day | ORAL | Status: DC
  Administered 2017-10-29 – 2017-11-01 (×4): 40 mg via ORAL
  Filled 2017-10-28 (×3): qty 2

## 2017-10-28 MED ORDER — FUROSEMIDE 10 MG/ML IJ SOLN
80.0000 mg | Freq: Two times a day (BID) | INTRAMUSCULAR | Status: DC
  Administered 2017-10-28 – 2017-11-01 (×8): 80 mg via INTRAVENOUS
  Filled 2017-10-28 (×8): qty 8

## 2017-10-28 MED ORDER — NITROGLYCERIN IN D5W 200-5 MCG/ML-% IV SOLN
0.0000 ug/min | Freq: Once | INTRAVENOUS | Status: AC
  Administered 2017-10-28: 10 ug/min via INTRAVENOUS

## 2017-10-28 MED ORDER — ASPIRIN 81 MG PO CHEW: 324.0000 mg | CHEWABLE_TABLET | Freq: Once | ORAL | Status: DC

## 2017-10-28 MED ORDER — INSULIN ASPART 100 UNIT/ML ~~LOC~~ SOLN: 0.0000 [IU] | Freq: Every day | SUBCUTANEOUS | Status: DC

## 2017-10-28 MED ORDER — INSULIN ASPART PROT & ASPART (70-30 MIX) 100 UNIT/ML ~~LOC~~ SUSP
60.0000 [IU] | Freq: Two times a day (BID) | SUBCUTANEOUS | Status: DC
  Administered 2017-10-29 – 2017-11-01 (×6): 60 [IU] via SUBCUTANEOUS
  Filled 2017-10-28 (×6): qty 1

## 2017-10-28 MED ORDER — LIDOCAINE HCL (PF) 1 % IJ SOLN
INTRAMUSCULAR | Status: AC
  Filled 2017-10-28: qty 30

## 2017-10-28 MED ORDER — SODIUM CHLORIDE 0.9 % IV SOLN
INTRAVENOUS | Status: DC
Start: 2017-10-28 — End: 2017-11-01
  Administered 2017-10-28: 15:00:00 via INTRAVENOUS

## 2017-10-28 MED ORDER — HEPARIN SODIUM (PORCINE) 5000 UNIT/ML IJ SOLN
4000.0000 [IU] | Freq: Once | INTRAMUSCULAR | Status: AC
  Administered 2017-10-28: 4000 [IU] via INTRAVENOUS

## 2017-10-28 MED ORDER — LISINOPRIL 10 MG PO TABS
10.0000 mg | ORAL_TABLET | Freq: Every day | ORAL | Status: DC
  Administered 2017-10-29 – 2017-11-01 (×4): 10 mg via ORAL
  Filled 2017-10-28 (×2): qty 1
  Filled 2017-10-28: qty 2
  Filled 2017-10-28: qty 1

## 2017-10-28 MED ORDER — NITROGLYCERIN IN D5W 200-5 MCG/ML-% IV SOLN
0.0000 ug/min | INTRAVENOUS | Status: DC
  Administered 2017-10-28: 55 ug/min via INTRAVENOUS
  Administered 2017-10-28: 50 ug/min via INTRAVENOUS
  Filled 2017-10-28: qty 250

## 2017-10-28 MED ORDER — CLOPIDOGREL BISULFATE 75 MG PO TABS
75.0000 mg | ORAL_TABLET | Freq: Every day | ORAL | Status: DC
  Administered 2017-10-29 – 2017-11-01 (×4): 75 mg via ORAL
  Filled 2017-10-28 (×4): qty 1

## 2017-10-28 MED ORDER — METOPROLOL TARTRATE 5 MG/5ML IV SOLN
5.0000 mg | Freq: Once | INTRAVENOUS | Status: AC
  Administered 2017-10-28: 5 mg via INTRAVENOUS
  Filled 2017-10-28: qty 5

## 2017-10-28 MED ORDER — NITROGLYCERIN IN D5W 200-5 MCG/ML-% IV SOLN
INTRAVENOUS | Status: AC
  Administered 2017-10-28: 10 ug/min via INTRAVENOUS
  Filled 2017-10-28: qty 250

## 2017-10-28 MED ORDER — DULOXETINE HCL 20 MG PO CPEP
20.0000 mg | ORAL_CAPSULE | Freq: Every day | ORAL | Status: DC
  Administered 2017-10-29 – 2017-11-01 (×4): 20 mg via ORAL
  Filled 2017-10-28 (×5): qty 1

## 2017-10-28 MED ORDER — METOPROLOL TARTRATE 50 MG PO TABS
100.0000 mg | ORAL_TABLET | Freq: Two times a day (BID) | ORAL | Status: DC
  Administered 2017-10-28 – 2017-11-01 (×7): 100 mg via ORAL
  Filled 2017-10-28 (×8): qty 2

## 2017-10-28 MED ORDER — ATORVASTATIN CALCIUM 20 MG PO TABS: 40.0000 mg | ORAL_TABLET | Freq: Every day | ORAL | Status: DC

## 2017-10-28 MED ORDER — PANTOPRAZOLE SODIUM 40 MG PO TBEC
40.0000 mg | DELAYED_RELEASE_TABLET | Freq: Every day | ORAL | Status: DC
  Administered 2017-10-29 – 2017-10-30 (×2): 40 mg via ORAL
  Filled 2017-10-28 (×2): qty 1

## 2017-10-28 MED ORDER — MORPHINE SULFATE (PF) 4 MG/ML IV SOLN
4.0000 mg | Freq: Once | INTRAVENOUS | Status: AC
  Administered 2017-10-28: 4 mg via INTRAVENOUS
  Filled 2017-10-28: qty 1

## 2017-10-28 MED ORDER — NITROGLYCERIN 2 % TD OINT
1.0000 [in_us] | TOPICAL_OINTMENT | Freq: Once | TRANSDERMAL | Status: AC
  Administered 2017-10-28: 1 [in_us] via TOPICAL
  Filled 2017-10-28: qty 1

## 2017-10-28 MED ORDER — INSULIN ASPART 100 UNIT/ML ~~LOC~~ SOLN
0.0000 [IU] | Freq: Three times a day (TID) | SUBCUTANEOUS | Status: DC
  Administered 2017-10-29: 3 [IU] via SUBCUTANEOUS
  Administered 2017-10-30: 4 [IU] via SUBCUTANEOUS
  Administered 2017-10-31: 15 [IU] via SUBCUTANEOUS
  Administered 2017-10-31: 20 [IU] via SUBCUTANEOUS
  Administered 2017-10-31 – 2017-11-01 (×2): 15 [IU] via SUBCUTANEOUS
  Administered 2017-11-01: 11 [IU] via SUBCUTANEOUS
  Filled 2017-10-28 (×7): qty 1

## 2017-10-28 MED ORDER — ACETAMINOPHEN 325 MG PO TABS
650.0000 mg | ORAL_TABLET | Freq: Four times a day (QID) | ORAL | Status: DC | PRN
  Filled 2017-10-28: qty 2

## 2017-10-28 MED ORDER — HEPARIN (PORCINE) IN NACL 100-0.45 UNIT/ML-% IJ SOLN
1600.0000 [IU]/h | INTRAMUSCULAR | Status: DC
  Administered 2017-10-28: 1200 [IU]/h via INTRAVENOUS
  Administered 2017-10-29: 1400 [IU]/h via INTRAVENOUS
  Administered 2017-10-30: 1600 [IU]/h via INTRAVENOUS
  Filled 2017-10-28 (×4): qty 250

## 2017-10-28 MED ORDER — ASPIRIN 81 MG PO CHEW
324.0000 mg | CHEWABLE_TABLET | Freq: Once | ORAL | Status: AC
  Administered 2017-10-28: 324 mg via ORAL

## 2017-10-28 MED ORDER — MOMETASONE FURO-FORMOTEROL FUM 200-5 MCG/ACT IN AERO
2.0000 | INHALATION_SPRAY | Freq: Two times a day (BID) | RESPIRATORY_TRACT | Status: DC
  Administered 2017-10-29 – 2017-11-01 (×7): 2 via RESPIRATORY_TRACT
  Filled 2017-10-28: qty 8.8

## 2017-10-28 MED ORDER — ALPRAZOLAM 0.25 MG PO TABS
0.2500 mg | ORAL_TABLET | Freq: Two times a day (BID) | ORAL | Status: DC | PRN
  Administered 2017-10-28 – 2017-10-30 (×2): 0.25 mg via ORAL
  Filled 2017-10-28 (×2): qty 1

## 2017-10-28 MED ORDER — DILTIAZEM HCL ER COATED BEADS 180 MG PO CP24
180.0000 mg | ORAL_CAPSULE | Freq: Two times a day (BID) | ORAL | Status: DC
  Administered 2017-10-28 – 2017-11-01 (×8): 180 mg via ORAL
  Filled 2017-10-28 (×9): qty 1

## 2017-10-28 MED ORDER — POTASSIUM CHLORIDE CRYS ER 20 MEQ PO TBCR
20.0000 meq | EXTENDED_RELEASE_TABLET | Freq: Every day | ORAL | Status: DC
  Administered 2017-10-29 – 2017-11-01 (×4): 20 meq via ORAL
  Filled 2017-10-28 (×4): qty 1

## 2017-10-28 MED ORDER — GI COCKTAIL ~~LOC~~
30.0000 mL | Freq: Four times a day (QID) | ORAL | Status: DC | PRN
  Filled 2017-10-28: qty 30

## 2017-10-28 MED ORDER — ACETAMINOPHEN 325 MG PO TABS
650.0000 mg | ORAL_TABLET | ORAL | Status: DC | PRN
  Administered 2017-10-28 – 2017-11-01 (×4): 650 mg via ORAL
  Filled 2017-10-28 (×4): qty 2

## 2017-10-28 MED ORDER — ATORVASTATIN CALCIUM 20 MG PO TABS
80.0000 mg | ORAL_TABLET | Freq: Every day | ORAL | Status: DC
  Administered 2017-10-28 – 2017-10-31 (×4): 80 mg via ORAL
  Filled 2017-10-28 (×4): qty 4

## 2017-10-28 MED ORDER — ALBUTEROL SULFATE (2.5 MG/3ML) 0.083% IN NEBU
3.0000 mL | INHALATION_SOLUTION | Freq: Four times a day (QID) | RESPIRATORY_TRACT | Status: DC | PRN
  Administered 2017-10-29: 3 mL via RESPIRATORY_TRACT
  Filled 2017-10-28: qty 3

## 2017-10-28 MED ORDER — MORPHINE SULFATE (PF) 2 MG/ML IV SOLN: 2.0000 mg | INTRAVENOUS | Status: DC | PRN

## 2017-10-28 MED ORDER — ONDANSETRON HCL 4 MG/2ML IJ SOLN
4.0000 mg | Freq: Once | INTRAMUSCULAR | Status: AC
  Administered 2017-10-28: 4 mg via INTRAVENOUS
  Filled 2017-10-28: qty 2

## 2017-10-28 NOTE — Progress Notes (Signed)
Family Meeting Note  Advance Directive:yes  Today a meeting took place with the Patient  Patient is able to participate  The following clinical team members were present during this meeting:MD  The following were discussed:Patient's diagnosis: Non-STEMI, obesity, coronary artery disease, congestive heart failure, diabetes, obstructive sleep apnea, Patient's progosis: Unable to determine and Goals for treatment: Full Code  Additional follow-up to be provided: prn  Time spent during discussion:20 minutes  Gorden Harms, MD

## 2017-10-28 NOTE — ED Notes (Signed)
Informed by Nolon Rod that House supervisor is aware and 2A stated that they are waiting on another nurse to come in at 7pm shift. Pt is to stay in ED until then.

## 2017-10-28 NOTE — ED Notes (Signed)
Pt states he is having little trouble breathing. Pt pulled up in bed and repositioned. Pt stating he still feels SOB. Nasal cannula applied with 2L.

## 2017-10-28 NOTE — ED Triage Notes (Signed)
CP to center and left side of chest that started today approx 12PM. Took 3 nitro PTA with no relief. Recent MI and stent placed Friday of this last week.

## 2017-10-28 NOTE — Progress Notes (Addendum)
ANTICOAGULATION CONSULT NOTE - Initial Consult  Pharmacy Consult for heparin Indication: chest pain/ACS  No Known Allergies  Patient Measurements:   Heparin Dosing Weight: 98 kg  Vital Signs: Temp: 98 F (36.7 C) (05/15 1436) Temp Source: Oral (05/15 1436) BP: 134/72 (05/15 1515) Pulse Rate: 101 (05/15 1515)  Labs: Recent Labs    10/28/17 1442  HGB 11.9*  HCT 34.7*  PLT 220  APTT 34  LABPROT 13.4  INR 1.03    Estimated Creatinine Clearance: 86.7 mL/min (by C-G formula based on SCr of 1.15 mg/dL).   Medical History: Past Medical History:  Diagnosis Date  . A-fib ([place])   . Aortic stenosis, moderate 11/2015  . Arthritis   . CAD (coronary artery disease)   . Chronic systolic CHF (congestive heart failure) ([place])   . Diabetes mellitus without complication ([place])    INSULIN DEPENDENT  . GERD (gastroesophageal reflux disease)   . History of cardioversion   . Hypertension   . MI (myocardial infarction) ([place])   . OSA on CPAP   . Sepsis ([place])   . Shortness of breath dyspnea   . Stroke ([place])   . SVT (supraventricular tachycardia) ([facility])     Medications:  Infusions:  . sodium chloride 20 mL/hr at 10/28/17 1452  . heparin      Assessment: 83 yom with CP. Took nitroglycerin x 3 with no relief. Had MI with stent stent placed 5 days PTA. EKG with sinus tachycardia with Premature atrial complexes; Marked ST abnormality, possible inferior subendocardial injury; Marked ST abnormality, possible anterolateral subendocardial injury. No troponin available at this time. Pharmacy consulted to dose heparin for ACS. PTA list includes Eliquis but needs to be verified. Will add-on Heparin level to baseline labs and dose based on aPTT if elevated.  Goal of Therapy:  Heparin level 0.3-0.7 units/ml aPTT 68 to 109 seconds Monitor platelets by anticoagulation protocol: Yes   Plan:  Give 4000 units bolus x 1 Start heparin infusion at 1200 units/hr Check aPTT level in 6 hours and  daily while on heparin. Check HL daily until aPTT and HL correlate, per protocol. Continue to monitor H&H and platelets  10/28/17 15:58 baseline HL elevated at 0.82. Continue current rate, will adjust dose based on aPTT until aPTT and HL correlate.  Laural Benes, Pharm.D., BCPS Clinical Pharmacist 10/28/2017,3:22 PM

## 2017-10-28 NOTE — H&P (Addendum)
[facility] NAME: Marcus Williams    MR#:  [id number]  DATE OF BIRTH:  08-30-1955  DATE OF ADMISSION:  10/28/2017  PRIMARY CARE PHYSICIAN: Idelle Crouch, MD   REQUESTING/REFERRING PHYSICIAN:   CHIEF COMPLAINT:   Chief Complaint  Patient presents with  . Chest Pain    HISTORY OF PRESENT ILLNESS: Marcus Williams  is a 62 y.o. male with a known history per below which also includes recent non-STEMI-discharged home 5 days ago status post stent placement, heart catheterization done May 10 noted for diffuse coronary artery disease, presenting with acute intermittent chest pain since yesterday, patient states that it is sharp, lasted few seconds, nonradiating, dyspnea on exertion, associated with shortness of breath, no better with nitroglycerin taken 3 times earlier today, in the emergency room EKG noted for sinus tachycardia with heart rate of 101/diffuse ST segment depression laterally/poor R wave progression, chest x-ray noted for cardiomegaly/mild bronchitis changes, hemoglobin 11.9, ED attending stated that patient probably has STEMI, case discussed between ED attending and Dr. Caldwood/cardiologist whom thinks patient has non-STEMI, had recommended heparin drip as well as medical therapy for now, cardiology to reassess in our, patient evaluated in the emergency room, family at the bedside, patient in no apparent distress, resting comfortably in bed, patient now be admitted for acute non-STEMI, troponin pending.  PAST MEDICAL HISTORY:   Past Medical History:  Diagnosis Date  . A-fib ([place])   . Aortic stenosis, moderate 11/2015  . Arthritis   . CAD (coronary artery disease)   . Chronic systolic CHF (congestive heart failure) ([place])   . Diabetes mellitus without complication ([place])    INSULIN DEPENDENT  . GERD (gastroesophageal reflux disease)   . History of cardioversion   . Hypertension   . MI (myocardial infarction) ([place])   . OSA on CPAP   .  Sepsis ([place])   . Shortness of breath dyspnea   . Stroke ([place])   . SVT (supraventricular tachycardia) ([place])     PAST SURGICAL HISTORY:  Past Surgical History:  Procedure Laterality Date  . 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 BUNIONECTOMY    . CARDIAC CATHETERIZATION N/A 10/02/2015   Procedure: Coronary/Grafts Angiography;  Surgeon: Teodoro Spray, MD;  Location: [place] [facility];  Service: Cardiovascular;  Laterality: N/A;  . CARDIAC CATHETERIZATION N/A 11/21/2015   Procedure: Right and Left Heart Cath;  Surgeon: Sherren Mocha, MD;  Location: [place] [facility];  Service: Cardiovascular;  Laterality: N/A;  . CORONARY ARTERY BYPASS GRAFT    . CORONARY STENT INTERVENTION N/A 07/30/2017   Procedure: CORONARY STENT INTERVENTION;  Surgeon: Isaias Cowman, MD;  Location: [place] [facility];  Service: Cardiovascular;  Laterality: N/A;  . CORONARY STENT INTERVENTION N/A 10/23/2017   Procedure: CORONARY STENT INTERVENTION;  Surgeon: Yolonda Kida, MD;  Location: [place] [facility];  Service: Cardiovascular;  Laterality: N/A;  . KNEE SURGERY    . LEFT HEART CATH AND CORS/GRAFTS ANGIOGRAPHY N/A 10/22/2017   Procedure: LEFT HEART CATH AND CORS/GRAFTS ANGIOGRAPHY;  Surgeon: Corey Skains, MD;  Location: [place] [facility];  Service: Cardiovascular;  Laterality: N/A;  . quadruple bypass    . RIGHT/LEFT HEART CATH AND CORONARY/GRAFT ANGIOGRAPHY N/A 07/30/2017   Procedure: RIGHT/LEFT HEART CATH AND CORONARY/GRAFT ANGIOGRAPHY;  Surgeon: Isaias Cowman, MD;  Location: [place] [facility];  Service: Cardiovascular;  Laterality: N/A;  . TEE WITHOUT CARDIOVERSION  11/21/2015  . TEE WITHOUT CARDIOVERSION N/A 11/21/2015   Procedure: TRANSESOPHAGEAL ECHOCARDIOGRAM (TEE);  Surgeon: Larey Dresser, MD;  Location: [facility] ENDOSCOPY;  Service: Cardiovascular;  Laterality: N/A;     SOCIAL HISTORY:  Social History   Tobacco Use  . Smoking status: Never Smoker  . Smokeless tobacco: Never Used  Substance Use Topics  . Alcohol use: No    FAMILY HISTORY:  Family History  Problem Relation Age of Onset  . Stroke Mother   . Heart attack Mother   . Hypertension Mother   . Heart attack Father   . Hypertension Father   . Heart attack Brother        #1  . Diabetes Brother        #1  . Heart disease Brother        #2  . Lung disease Brother        #2  . Hypertension Brother        #2  . Diabetes Brother        #2    DRUG ALLERGIES: No Known Allergies  REVIEW OF SYSTEMS:   CONSTITUTIONAL: No fever, + fatigue, weakness.  EYES: No blurred or double vision.  EARS, NOSE, AND THROAT: No tinnitus or ear pain.  RESPIRATORY: + cough, shortness of breath, no wheezing or hemoptysis.  CARDIOVASCULAR: + chest pain, no orthopnea, edema.  GASTROINTESTINAL: No nausea, vomiting, diarrhea or abdominal pain.  GENITOURINARY: No dysuria, hematuria.  ENDOCRINE: No polyuria, nocturia,  HEMATOLOGY: No anemia, easy bruising or bleeding SKIN: No rash or lesion. MUSCULOSKELETAL: No joint pain or arthritis.   NEUROLOGIC: No tingling, numbness, weakness.  PSYCHIATRY: No anxiety or depression.   MEDICATIONS AT HOME:  Prior to Admission medications   Medication Sig Start Date End Date Taking? Authorizing Provider  acetaminophen (TYLENOL) 325 MG tablet Take 2 tablets (650 mg total) by mouth every 6 (six) hours as needed for mild pain or headache. 10/24/17   Gouru, Illene Silver, MD  albuterol (PROVENTIL HFA;VENTOLIN HFA) 108 (90 Base) MCG/ACT inhaler Inhale 2 puffs into the lungs every 6 (six) hours as needed for wheezing or shortness of breath. 07/11/15   Flora Lipps, MD  amoxicillin (AMOXIL) 875 MG tablet Take 875 mg by mouth 2 (two) times daily. 10/22/17   [provider]  apixaban (ELIQUIS) 5 MG TABS tablet Take 5 mg by mouth 2 (two) times daily.    [provider]   atorvastatin (LIPITOR) 40 MG tablet Take 1 tablet (40 mg total) by mouth daily at 6 PM. 10/24/17   Gouru, Aruna, MD  baclofen (LIORESAL) 10 MG tablet Take 10 mg by mouth 2 (two) times daily as needed for muscle spasms.     [provider]  clopidogrel (PLAVIX) 75 MG tablet Take 1 tablet (75 mg total) by mouth daily with breakfast. 08/01/17   Max Sane, MD  diltiazem (CARDIZEM CD) 180 MG 24 hr capsule Take 180 mg by mouth 2 (two) times daily.    [provider]  DULoxetine (CYMBALTA) 20 MG capsule Take 20 mg by mouth daily.    [provider]  Fluticasone-Salmeterol (ADVAIR) 250-50 MCG/DOSE AEPB Inhale 1 puff into the lungs 2 (two) times daily.    [provider]  insulin NPH-regular Human (NOVOLIN 70/30) (70-30) 100 UNIT/ML injection Inject 85-100 Units into the skin 2 (two) times daily with a meal. 85 units in the morning and 100 units in the evening  [provider]  lisinopril (PRINIVIL,ZESTRIL) 10 MG tablet Take 1 tablet (10 mg total) by mouth daily. 08/14/17   Gladstone Lighter, MD  metFORMIN (GLUCOPHAGE) 1000 MG tablet Take 1,000 mg by mouth 2 (two) times daily with a meal.    [provider]  metoprolol (LOPRESSOR) 100 MG tablet Take 100 mg by mouth 2 (two) times daily. 10/15/15   [provider]  nitroGLYCERIN (NITROSTAT) 0.4 MG SL tablet Place 0.4 mg under the tongue every 5 (five) minutes as needed for chest pain.    [provider]  pantoprazole (PROTONIX) 40 MG tablet Take 40 mg by mouth daily.    [provider]  potassium chloride 20 MEQ TBCR Take 20 mEq by mouth daily. 11/23/15   Shirley Friar, PA-C  pramipexole (MIRAPEX) 0.5 MG tablet Take 0.5 mg by mouth 3 (three) times daily as needed.     [provider]  predniSONE (DELTASONE) 10 MG tablet Take 10-40 mg by mouth daily with breakfast. 40 mg x2 days, 30 mg x2 day, 20 mg x2 days, 10 mg x2 days 10/22/17   [provider]  torsemide  (DEMADEX) 20 MG tablet Take 2 tablets (40 mg total) by mouth daily. 08/14/17   Gladstone Lighter, MD      PHYSICAL EXAMINATION:   VITAL SIGNS: Blood pressure 137/68, pulse 96, temperature 98 F (36.7 C), temperature source Oral, resp. rate 17, SpO2 92 %.  GENERAL:  62 y.o.-year-old patient lying in the bed with no acute distress.  Extreme morbid obesity EYES: Pupils equal, round, reactive to light and accommodation. No scleral icterus. Extraocular muscles intact.  HEENT: Head atraumatic, normocephalic. Oropharynx and nasopharynx clear.  NECK:  Supple, no jugular venous distention. No thyroid enlargement, no tenderness.  LUNGS: Normal breath sounds bilaterally, no wheezing, rales,rhonchi or crepitation. No use of accessory muscles of respiration.  CARDIOVASCULAR: S1, S2 normal. No murmurs, rubs, or gallops.  ABDOMEN: Soft, nontender, nondistended. Bowel sounds present. No organomegaly or mass.  EXTREMITIES: Bilateral lower extremity pitting edema,no cyanosis, or clubbing.  NEUROLOGIC: Cranial nerves II through XII are intact. Muscle strength 5/5 in all extremities. Sensation intact. Gait not checked.  PSYCHIATRIC: The patient is alert and oriented x 3.  SKIN: No obvious rash, lesion, or ulcer.   LABORATORY PANEL:   CBC Recent Labs  Lab 10/21/17 2327 10/23/17 0159 10/28/17 1442  WBC 6.5 6.5 10.6  HGB 12.3* 12.0* 11.9*  HCT 35.8* 35.3* 34.7*  PLT 148* 139* 220  MCV 88.1 88.6 87.9  MCH 30.4 30.1 30.3  MCHC 34.5 34.0 34.4  RDW 14.9* 14.4 15.0*  LYMPHSABS  --   --  0.6*  MONOABS  --   --  0.4  EOSABS  --   --  0.0  BASOSABS  --   --  0.1   ------------------------------------------------------------------------------------------------------------------  Chemistries  Recent Labs  Lab 10/21/17 2327 10/28/17 1442  NA 137 137  K 3.6 3.8  CL 104 105  CO2 25 20*  GLUCOSE 333* 140*  BUN 22* 24*  CREATININE 1.15 1.04  CALCIUM 8.5* 9.3  AST 26 46*  ALT 27 39  ALKPHOS 56 66   BILITOT 0.6 0.7   ------------------------------------------------------------------------------------------------------------------ estimated creatinine clearance is 95.8 mL/min (by C-G formula based on SCr of 1.04 mg/dL). ------------------------------------------------------------------------------------------------------------------ No results for input(s): TSH, T4TOTAL, T3FREE, THYROIDAB in the last 72 hours.  Invalid input(s): FREET3   Coagulation profile Recent Labs  Lab 10/28/17 1442  INR 1.03   ------------------------------------------------------------------------------------------------------------------- No results for  input(s): DDIMER in the last 72 hours. -------------------------------------------------------------------------------------------------------------------  Cardiac Enzymes Recent Labs  Lab 10/21/17 2327 10/22/17 0233 10/22/17 1313  TROPONINI 0.07* 0.47* 1.63*   ------------------------------------------------------------------------------------------------------------------ Invalid input(s): POCBNP  ---------------------------------------------------------------------------------------------------------------  Urinalysis    Component Value Date/Time   COLORURINE YELLOW (A) 08/10/2017 1930   APPEARANCEUR HAZY (A) 08/10/2017 1930   APPEARANCEUR Clear 07/21/2014 1803   LABSPEC 1.013 08/10/2017 1930   LABSPEC 1.011 07/21/2014 1803   PHURINE 5.0 08/10/2017 1930   GLUCOSEU 150 (A) 08/10/2017 1930   GLUCOSEU >=500 07/21/2014 1803   HGBUR MODERATE (A) 08/10/2017 1930   BILIRUBINUR NEGATIVE 08/10/2017 1930   BILIRUBINUR Negative 07/21/2014 1803   KETONESUR NEGATIVE 08/10/2017 1930   PROTEINUR 100 (A) 08/10/2017 1930   UROBILINOGEN 0.2 07/28/2014 1449   NITRITE NEGATIVE 08/10/2017 1930   LEUKOCYTESUR NEGATIVE 08/10/2017 1930   LEUKOCYTESUR Negative 07/21/2014 1803     RADIOLOGY: Dg Chest Portable 1 View  Result Date: 10/28/2017 CLINICAL  DATA:  Central and LEFT chest pain beginning this afternoon. History of myocardial infarction and recent cardiac stent placement. EXAM: PORTABLE CHEST 1 VIEW COMPARISON:  Chest radiograph July 24, 2017 FINDINGS: Cardiac silhouette is mildly enlarged and unchanged. Status post median sternotomy for CABG, chronically fractured proximal wires. Mild bronchitic changes without pleural effusion or focal consolidation. No pneumothorax. Soft tissue planes included osseous structures are nonsuspicious. IMPRESSION: Stable cardiomegaly.  Mild bronchitic changes. Electronically Signed   By: Elon Alas M.D.   On: 10/28/2017 15:20    EKG: Orders placed or performed during the hospital encounter of 10/28/17  . ED EKG within 10 minutes  . ED EKG within 10 minutes  . EKG 12-Lead  . EKG 12-Lead    IMPRESSION AND PLAN: *Acute recurrent chest pain with coronary artery disease status post CABG and coronary artery stenting Most likely secondary to acute non-STEMI, hospitalization last week for non-STEMI status post heart catheterization noted for diffuse coronary artery disease, status post stenting at that time Admit to telemetry bed on our ACS protocol, cycle cardiac enzymes, statin therapy, check lipids in the morning, Plavix, Eliquis currently on hold given recent spinal injection on yesterday, lisinopril, Lopressor, heparin drip will be continued, nitroglycerin drip, cardiology to see, adult pain protocol with morphine as needed breakthrough pain, supplemental oxygen as needed, and continue close medical monitoring  *Acute on chronic systolic/diastolic congestive heart failure exacerbation Congestive heart failure protocol, IV Lasix, lisinopril, Lopressor, Plavix, statin therapy, strict I&O monitoring, daily weights  *History of paroxysmal A. Fib Currently in sinus rhythm Eliquis currently on hold for spinal injection on yesterday-consider restarting on tomorrow, continue Cardizem,  metoprolol  *Chronic diabetes mellitus type 2 Stable Hold metformin, continue NovoLog at reduced dose of 60 units twice daily, sliding scale insulin with Accu-Cheks per routine  *Chronic obstructive sleep apnea Stable May use home device at bedtime/as needed  *COPD without exacerbation Stable Breathing treatments as needed  *Extreme morbid obesity Stable lifestyle modification recommended   All the records are reviewed and case discussed with ED provider. Management plans discussed with the patient, family and they are in agreement.  CODE STATUS:full Code Status History    Date Active Date Inactive Code Status Order ID Comments User Context   10/22/2017 0551 10/24/2017 1801 Full Code [id number]  Arta Silence, MD ED   08/10/2017 2205 08/14/2017 1512 Full Code [id number]  Vaughan Basta, MD Inpatient   07/31/2017 2045 08/02/2017 1648 Full Code [id number]  Henreitta Leber, MD Inpatient   07/28/2017 2230 07/31/2017 1428 Full Code  [id number]  Dustin Flock, MD Inpatient   03/01/2017 0203 03/01/2017 2004 Full Code [id number]  Harvie Bridge, DO Inpatient   11/21/2015 1620 11/23/2015 1710 Full Code [id number]  Sherren Mocha, MD Inpatient   05/06/2015 1545 [id number] Full Code [id number]  Sharlotte Alamo, MD Inpatient   05/04/2015 1951 05/06/2015 1545 Full Code [id number]  Vaughan Basta, MD ED   01/14/2015 1416 01/15/2015 1514 Full Code [id number]  Lance Coon, MD Inpatient   12/22/2014 1825 12/23/2014 1612 Full Code [id number]  Epifanio Lesches, MD ED   10/14/2014 1214 10/15/2014 1350 Full Code [id number]  Alfonso Patten, RN Inpatient    Advance Directive Documentation     Most Recent Value  Type of Advance Directive  Healthcare Power of Attorney  Pre-existing out of facility DNR order (yellow form or pink MOST form)  -  "MOST" Form in Place?  -       TOTAL TIME TAKING CARE OF THIS PATIENT: 45 minutes.    Avel Peace Jamille Yoshino M.D on 10/28/2017   Between 7am to 6pm - Pager -  [phone]  After 6pm go to [email or web address] - password [email or web address] [facility]  Office  [phone]  CC: Primary care physician; Idelle Crouch, MD   Note: This dictation was prepared with Dragon dictation along with smaller phrase technology. Any transcriptional errors that result from this process are unintentional.

## 2017-10-28 NOTE — ED Provider Notes (Signed)
[facility] Emergency Department Provider Note  ____________________________________________  Time seen: Approximately 2:56 PM  I have reviewed the triage vital signs and the nursing notes.   HISTORY  Chief Complaint Chest Pain   HPI Marcus Williams is a 62 y.o. male with a history of CAD status post CABG and stents, recent NSTEMI 5 days ago on Plavix, CHF, diabetes, hypertension who presents for evaluation of chest pain.  Patient reports that he woke up this morning with severe left-sided chest pressure associated with the shortness of breath.  Patient denies nausea, vomiting, dizziness.  He reports that he feels similar to his prior heart attacks.  Patient denies cough, fever, leg pain or swelling, personal or family history of PE or DVT.  He reports that he took several nitros at home with no relief of his pain.  At this time he  reports pain level of 25  Past Medical History:  Diagnosis Date  . A-fib ([place])   . Aortic stenosis, moderate 11/2015  . Arthritis   . CAD (coronary artery disease)   . Chronic systolic CHF (congestive heart failure) ([place])   . Diabetes mellitus without complication ([place])    INSULIN DEPENDENT  . GERD (gastroesophageal reflux disease)   . History of cardioversion   . Hypertension   . MI (myocardial infarction) ([place])   . OSA on CPAP   . Sepsis ([place])   . Shortness of breath dyspnea   . Stroke ([place])   . SVT (supraventricular tachycardia) [facility])     Patient Active Problem List   Diagnosis Date Noted  . Pneumonia 08/10/2017  . NSTEMI (non-ST elevated myocardial infarction) ([place]) 07/28/2017  . TIA (transient ischemic attack) 03/01/2017  . Aortic stenosis 11/21/2015  . Acute on chronic diastolic CHF (congestive heart failure), NYHA class 3 ([place]) 11/21/2015  . Aortic valve, bicuspid 09/24/2015  . Cellulitis 05/04/2015  . Acute respiratory failure with hypoxia ([place]) 03/26/2015  . CHF (congestive heart failure) ([place]) 01/15/2015  .  HTN (hypertension) 01/14/2015  . Type II diabetes mellitus ([place]) 01/14/2015  . COPD (chronic obstructive pulmonary disease) ([place]) 01/14/2015  . OSA on CPAP 01/14/2015  . CAD (coronary artery disease) 01/14/2015  . A-fib ([place]) 01/14/2015  . Pain of left calf 01/14/2015  . Acute on chronic systolic CHF (congestive heart failure) ([place]) 01/14/2015  . COPD exacerbation ([place]) 12/22/2014  . Diabetes ([place]) 10/14/2014    Past Surgical History:  Procedure Laterality Date  . 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 BUNIONECTOMY    . CARDIAC CATHETERIZATION N/A 10/02/2015   Procedure: Coronary/Grafts Angiography;  Surgeon: Teodoro Spray, MD;  Location: [place] CV LAB;  Service: Cardiovascular;  Laterality: N/A;  . CARDIAC CATHETERIZATION N/A 11/21/2015   Procedure: Right and Left Heart Cath;  Surgeon: Sherren Mocha, MD;  Location: [place] CV LAB;  Service: Cardiovascular;  Laterality: N/A;  . CORONARY ARTERY BYPASS GRAFT    . CORONARY STENT INTERVENTION N/A 07/30/2017   Procedure: CORONARY STENT INTERVENTION;  Surgeon: Isaias Cowman, MD;  Location: [place] CV LAB;  Service: Cardiovascular;  Laterality: N/A;  . CORONARY STENT INTERVENTION N/A 10/23/2017   Procedure: CORONARY STENT INTERVENTION;  Surgeon: Yolonda Kida, MD;  Location: [place] CV LAB;  Service: Cardiovascular;  Laterality: N/A;  . KNEE SURGERY    . LEFT HEART CATH AND CORS/GRAFTS ANGIOGRAPHY N/A 10/22/2017  Procedure: LEFT HEART CATH AND CORS/GRAFTS ANGIOGRAPHY;  Surgeon: Corey Skains, MD;  Location: [place] CV LAB;  Service: Cardiovascular;  Laterality: N/A;  . quadruple bypass    . RIGHT/LEFT HEART CATH AND CORONARY/GRAFT ANGIOGRAPHY N/A 07/30/2017   Procedure: RIGHT/LEFT HEART CATH AND CORONARY/GRAFT ANGIOGRAPHY;  Surgeon: Isaias Cowman, MD;  Location: [place] CV LAB;   Service: Cardiovascular;  Laterality: N/A;  . TEE WITHOUT CARDIOVERSION  11/21/2015  . TEE WITHOUT CARDIOVERSION N/A 11/21/2015   Procedure: TRANSESOPHAGEAL ECHOCARDIOGRAM (TEE);  Surgeon: Larey Dresser, MD;  Location: [place];  Service: Cardiovascular;  Laterality: N/A;    Prior to Admission medications   Medication Sig Start Date End Date Taking? Authorizing Provider  acetaminophen (TYLENOL) 325 MG tablet Take 2 tablets (650 mg total) by mouth every 6 (six) hours as needed for mild pain or headache. 10/24/17   Gouru, Illene Silver, MD  albuterol (PROVENTIL HFA;VENTOLIN HFA) 108 (90 Base) MCG/ACT inhaler Inhale 2 puffs into the lungs every 6 (six) hours as needed for wheezing or shortness of breath. 07/11/15   Flora Lipps, MD  amoxicillin (AMOXIL) 875 MG tablet Take 875 mg by mouth 2 (two) times daily. 10/22/17   [provider]  apixaban (ELIQUIS) 5 MG TABS tablet Take 5 mg by mouth 2 (two) times daily.    [provider]  atorvastatin (LIPITOR) 40 MG tablet Take 1 tablet (40 mg total) by mouth daily at 6 PM. 10/24/17   Gouru, Aruna, MD  baclofen (LIORESAL) 10 MG tablet Take 10 mg by mouth 2 (two) times daily as needed for muscle spasms.     [provider]  clopidogrel (PLAVIX) 75 MG tablet Take 1 tablet (75 mg total) by mouth daily with breakfast. 08/01/17   Max Sane, MD  diltiazem (CARDIZEM CD) 180 MG 24 hr capsule Take 180 mg by mouth 2 (two) times daily.    [provider]  DULoxetine (CYMBALTA) 20 MG capsule Take 20 mg by mouth daily.    [provider]  Fluticasone-Salmeterol (ADVAIR) 250-50 MCG/DOSE AEPB Inhale 1 puff into the lungs 2 (two) times daily.    [provider]  insulin NPH-regular Human (NOVOLIN 70/30) (70-30) 100 UNIT/ML injection Inject 85-100 Units into the skin 2 (two) times daily with a meal. 85 units in the morning and 100 units in the evening    [provider]  lisinopril (PRINIVIL,ZESTRIL) 10 MG tablet Take 1  tablet (10 mg total) by mouth daily. 08/14/17   Gladstone Lighter, MD  metFORMIN (GLUCOPHAGE) 1000 MG tablet Take 1,000 mg by mouth 2 (two) times daily with a meal.    [provider]  metoprolol (LOPRESSOR) 100 MG tablet Take 100 mg by mouth 2 (two) times daily. 10/15/15   [provider]  nitroGLYCERIN (NITROSTAT) 0.4 MG SL tablet Place 0.4 mg under the tongue every 5 (five) minutes as needed for chest pain.    [provider]  pantoprazole (PROTONIX) 40 MG tablet Take 40 mg by mouth daily.    [provider]  potassium chloride 20 MEQ TBCR Take 20 mEq by mouth daily. 11/23/15   Shirley Friar, PA-C  pramipexole (MIRAPEX) 0.5 MG tablet Take 0.5 mg by mouth 3 (three) times daily as needed.     [provider]  predniSONE (DELTASONE) 10 MG tablet Take 10-40 mg by mouth daily with breakfast. 40 mg x2 days, 30 mg x2 day, 20 mg x2 days, 10 mg x2 days 10/22/17   [provider]  torsemide (DEMADEX) 20 MG tablet Take 2 tablets (40 mg total) by mouth daily. 08/14/17   Gladstone Lighter, MD    Allergies Patient has no known allergies.  Family History  Problem Relation Age of Onset  . Stroke Mother   . Heart attack Mother   . Hypertension Mother   . Heart attack Father   . Hypertension Father   . Heart attack Brother        #1  . Diabetes Brother        #1  . Heart disease Brother        #2  . Lung disease Brother        #2  . Hypertension Brother        #2  . Diabetes Brother        #2    Social History Social History   Tobacco Use  . Smoking status: Never Smoker  . Smokeless tobacco: Never Used  Substance Use Topics  . Alcohol use: No  . Drug use: No    Review of Systems  Constitutional: Negative for fever. Eyes: Negative for visual changes. ENT: Negative for sore throat. Neck: No neck pain  Cardiovascular: + chest pain. Respiratory: + shortness of breath. Gastrointestinal: Negative for abdominal pain, vomiting or  diarrhea. Genitourinary: Negative for dysuria. Musculoskeletal: Negative for back pain. Skin: Negative for rash. Neurological: Negative for headaches, weakness or numbness. Psych: No SI or HI  ____________________________________________   PHYSICAL EXAM:  VITAL SIGNS: ED Triage Vitals  Enc Vitals Group     BP 10/28/17 1445 138/71     Pulse Rate 10/28/17 1436 100     Resp 10/28/17 1436 18     Temp 10/28/17 1436 98 F (36.7 C)     Temp Source 10/28/17 1436 Oral     SpO2 10/28/17 1436 100 %     Weight --      Height --      Head Circumference --      Peak Flow --      Pain Score 10/28/17 1436 10     Pain Loc --      Pain Edu? --      Excl. in [place]? --     Constitutional: Alert and oriented, diaphoretic.  HEENT:      Head: Normocephalic and atraumatic.         Eyes: Conjunctivae are normal. Sclera is non-icteric.       Mouth/Throat: Mucous membranes are moist.       Neck: Supple with no signs of meningismus. Cardiovascular: Regular rate and rhythm. No murmurs, gallops, or rubs. 2+ symmetrical distal pulses are present in all extremities. No JVD. Respiratory: Normal respiratory effort. Lungs are clear to auscultation bilaterally. No wheezes, crackles, or rhonchi.  Gastrointestinal: Soft, non tender, and non distended with positive bowel sounds. No rebound or guarding. Musculoskeletal: Nontender with normal range of motion in all extremities. No edema, cyanosis, or erythema of extremities. Neurologic: Normal speech and language. Face is symmetric. Moving all extremities. No gross focal neurologic deficits are appreciated. Skin: Skin is warm, dry and intact. No rash noted. Psychiatric: Mood and affect are normal. Speech and behavior are normal.  ____________________________________________   LABS (all labs ordered are listed, but only abnormal results are displayed)  Labs Reviewed  CBC WITH DIFFERENTIAL/PLATELET - Abnormal; Notable for the following components:      Result  Value   RBC 3.94 (*)    Hemoglobin 11.9 (*)    HCT 34.7 (*)  RDW 15.0 (*)    Neutro Abs 9.5 (*)    Lymphs Abs 0.6 (*)    All other components within normal limits  GLUCOSE, CAPILLARY - Abnormal; Notable for the following components:   Glucose-Capillary 147 (*)    All other components within normal limits  PROTIME-INR  APTT  TROPONIN I  COMPREHENSIVE METABOLIC PANEL  LIPID PANEL  HEPARIN LEVEL (UNFRACTIONATED)  APTT   ____________________________________________  EKG  ED ECG REPORT I, Rudene Re, the attending physician, personally viewed and interpreted this ECG.   Sinus tachycardia with rate of 101, diffuse ST depression in inferior, anterior, and lateral leads with significant ST elevation in aVR. These findings are significant worse when compared to prior from last week  ____________________________________________  RADIOLOGY  I have personally reviewed the images performed during this visit and I agree with the Radiologist's read.   Interpretation by Radiologist:  Dg Chest Portable 1 View  Result Date: 10/28/2017 CLINICAL DATA:  Central and LEFT chest pain beginning this afternoon. History of myocardial infarction and recent cardiac stent placement. EXAM: PORTABLE CHEST 1 VIEW COMPARISON:  Chest radiograph July 24, 2017 FINDINGS: Cardiac silhouette is mildly enlarged and unchanged. Status post median sternotomy for CABG, chronically fractured proximal wires. Mild bronchitic changes without pleural effusion or focal consolidation. No pneumothorax. Soft tissue planes included osseous structures are nonsuspicious. IMPRESSION: Stable cardiomegaly.  Mild bronchitic changes. Electronically Signed   By: Elon Alas M.D.   On: 10/28/2017 15:20      ____________________________________________   PROCEDURES  Procedure(s) performed: None Procedures Critical Care performed: yes  CRITICAL CARE Performed by: Rudene Re  ?  Total critical care  time: 30 min  Critical care time was exclusive of separately billable procedures and treating other patients.  Critical care was necessary to treat or prevent imminent or life-threatening deterioration.  Critical care was time spent personally by me on the following activities: development of treatment plan with patient and/or surrogate as well as nursing, discussions with consultants, evaluation of patient's response to treatment, examination of patient, obtaining history from patient or surrogate, ordering and performing treatments and interventions, ordering and review of laboratory studies, ordering and review of radiographic studies, pulse oximetry and re-evaluation of patient's condition.  ____________________________________________   INITIAL IMPRESSION / ASSESSMENT AND PLAN / ED COURSE  62 y.o. male with a history of CAD status post CABG and stents, recent NSTEMI 5 days ago on Plavix, CHF, diabetes, hypertension who presents for evaluation of chest pain. EKG concerning for STEMI. Code STEMI called. Spoke with Dr. Clayborn Bigness who is coming to evaluate patient. Patient given 324mg  ASA, 4000H heparin, and started on nitro gtt for pain. Labs pending    _________________________ 3:30 PM on 10/28/2017 -----------------------------------------  Patient evaluated by Dr. Clayborn Bigness who recommended maximizing pain control, heparin gtt and close monitoring. He does not believe patient needs to go to the cath lab immediately but if pain not well controlled in an hour he will reassess.  At this time patient is at 120 mcg of nitro and has received morphine. Dr. Clayborn Bigness also recommended 5mg  of IV metoprolol which has been ordered. First troponin is pending. Dr. Jerelyn Charles, hospitalist has been consulted for admission.    As part of my medical decision making, I reviewed the following data within the [facility] notes reviewed and incorporated, Labs reviewed , EKG interpreted , Old EKG  reviewed, Old chart reviewed, Radiograph reviewed , Discussed with admitting physician , A consult was requested and  obtained from this/these consultant(s) Cardiology, Notes from prior ED visits and [place] Controlled Substance Database    Pertinent labs & imaging results that were available during my care of the patient were reviewed by me and considered in my medical decision making (see chart for details).    ____________________________________________   FINAL CLINICAL IMPRESSION(S) / ED DIAGNOSES  Final diagnoses:  ST elevation myocardial infarction (STEMI), unspecified artery ([place])      NEW MEDICATIONS STARTED DURING THIS VISIT:  ED Discharge Orders    None       Note:  This document was prepared using Dragon voice recognition software and may include unintentional dictation errors.    Rudene Re, MD 10/28/17 [phone]

## 2017-10-28 NOTE — Consult Note (Signed)
Cardiology Consultation Note    Patient ID: Marcus Williams, MRN: [id number], DOB/AGE: [birth year] 62 y.o. Admit date: 10/28/2017   Date of Consult: 10/28/2017 Primary Physician: Idelle Crouch, MD Primary Cardiologist: Dr. Ubaldo Glassing  Chief Complaint: chest pain Reason for Consultation: chest pain Requesting MD: Dr. Jerelyn Charles  HPI: Marcus Williams is a 62 y.o. male with history of urinary artery disease as well as mild to moderate aortic stenosis with a recent PCI of the saphenous vein graft to RCA with placement of drug-eluting stent on Oct 23, 2017.  He was discharged on Oct 24, 2017.  He now returns emergency room with complaints of chest pain.  He states he has been compliant with medications including aspirin and Plavix and states he took his Eliquis today.  Electrocardiogram shows ST depression in the lateral leads no ST elevation.  Serum troponin is elevated at 0.51 however is less than a value of 1.636 days ago and likely is trending down from his non-ST elevation myocardial infarction.  He has improved with oxygen and heparin.  He does not appear to have an ST elevation myocardial infarction.  He is currently pain-free and hemodynamically stable.  He has been compliant with his medications.  Past Medical History:  Diagnosis Date  . A-fib ([place])   . Aortic stenosis, moderate 11/2015  . Arthritis   . CAD (coronary artery disease)   . Chronic systolic CHF (congestive heart failure) ([place])   . Diabetes mellitus without complication ([place])    INSULIN DEPENDENT  . GERD (gastroesophageal reflux disease)   . History of cardioversion   . Hypertension   . MI (myocardial infarction) ([place])   . OSA on CPAP   . Sepsis ([place])   . Shortness of breath dyspnea   . Stroke ([place])   . SVT (supraventricular tachycardia) [facility])       Surgical History:  Past Surgical History:  Procedure Laterality Date  . 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 BUNIONECTOMY    . CARDIAC CATHETERIZATION N/A 10/02/2015   Procedure: Coronary/Grafts Angiography;  Surgeon: Teodoro Spray, MD;  Location: [place] [facility];  Service: Cardiovascular;  Laterality: N/A;  . CARDIAC CATHETERIZATION N/A 11/21/2015   Procedure: Right and Left Heart Cath;  Surgeon: Sherren Mocha, MD;  Location: [place] [facility];  Service: Cardiovascular;  Laterality: N/A;  . CORONARY ARTERY BYPASS GRAFT    . CORONARY STENT INTERVENTION N/A 07/30/2017   Procedure: CORONARY STENT INTERVENTION;  Surgeon: Isaias Cowman, MD;  Location: [place] [facility];  Service: Cardiovascular;  Laterality: N/A;  . CORONARY STENT INTERVENTION N/A 10/23/2017   Procedure: CORONARY STENT INTERVENTION;  Surgeon: Yolonda Kida, MD;  Location: [place] [facility];  Service: Cardiovascular;  Laterality: N/A;  . KNEE SURGERY    . LEFT HEART CATH AND CORS/GRAFTS ANGIOGRAPHY N/A 10/22/2017   Procedure: LEFT HEART CATH AND CORS/GRAFTS ANGIOGRAPHY;  Surgeon: Corey Skains, MD;  Location: [place] [facility];  Service: Cardiovascular;  Laterality: N/A;  . quadruple bypass    . RIGHT/LEFT HEART CATH AND CORONARY/GRAFT ANGIOGRAPHY N/A 07/30/2017   Procedure: RIGHT/LEFT HEART CATH AND CORONARY/GRAFT ANGIOGRAPHY;  Surgeon: Isaias Cowman, MD;  Location: [place] [facility];  Service: Cardiovascular;  Laterality: N/A;  . TEE WITHOUT CARDIOVERSION  11/21/2015  . TEE WITHOUT CARDIOVERSION N/A 11/21/2015   Procedure: TRANSESOPHAGEAL ECHOCARDIOGRAM (TEE);  Surgeon: Kirk Ruths  Claris Gladden, MD;  Location: [facility] ENDOSCOPY;  Service: Cardiovascular;  Laterality: N/A;     Home Meds: Prior to Admission medications   Medication Sig Start Date End Date Taking? Authorizing Provider  acetaminophen (TYLENOL) 325 MG tablet Take 2 tablets (650 mg total) by mouth every 6 (six) hours as needed for mild pain or headache. 10/24/17  Yes Gouru, Illene Silver, MD  albuterol (PROVENTIL  HFA;VENTOLIN HFA) 108 (90 Base) MCG/ACT inhaler Inhale 2 puffs into the lungs every 6 (six) hours as needed for wheezing or shortness of breath. 07/11/15  Yes Kasa, Maretta Bees, MD  amoxicillin (AMOXIL) 875 MG tablet Take 875 mg by mouth 2 (two) times daily. 10/22/17  Yes [provider]  apixaban (ELIQUIS) 5 MG TABS tablet Take 5 mg by mouth 2 (two) times daily.   Yes [provider]  atorvastatin (LIPITOR) 40 MG tablet Take 1 tablet (40 mg total) by mouth daily at 6 PM. 10/24/17  Yes Gouru, Aruna, MD  baclofen (LIORESAL) 10 MG tablet Take 10 mg by mouth 2 (two) times daily as needed for muscle spasms.    Yes [provider]  clopidogrel (PLAVIX) 75 MG tablet Take 1 tablet (75 mg total) by mouth daily with breakfast. 08/01/17  Yes Max Sane, MD  diltiazem (CARDIZEM CD) 180 MG 24 hr capsule Take 180 mg by mouth 2 (two) times daily.   Yes [provider]  DULoxetine (CYMBALTA) 20 MG capsule Take 20 mg by mouth daily.   Yes [provider]  Fluticasone-Salmeterol (ADVAIR) 250-50 MCG/DOSE AEPB Inhale 1 puff into the lungs 2 (two) times daily.   Yes [provider]  insulin NPH-regular Human (NOVOLIN 70/30) (70-30) 100 UNIT/ML injection Inject 85-100 Units into the skin 2 (two) times daily with a meal. 85 units in the morning and 100 units in the evening   Yes [provider]  lisinopril (PRINIVIL,ZESTRIL) 10 MG tablet Take 1 tablet (10 mg total) by mouth daily. 08/14/17  Yes Gladstone Lighter, MD  metFORMIN (GLUCOPHAGE) 1000 MG tablet Take 1,000 mg by mouth 2 (two) times daily with a meal.   Yes [provider]  metoprolol (LOPRESSOR) 100 MG tablet Take 100 mg by mouth 2 (two) times daily. 10/15/15  Yes [provider]  nitroGLYCERIN (NITROSTAT) 0.4 MG SL tablet Place 0.4 mg under the tongue every 5 (five) minutes as needed for chest pain.   Yes [provider]  pantoprazole (PROTONIX) 40 MG tablet Take 40 mg by mouth daily.    Yes [provider]  potassium chloride 20 MEQ TBCR Take 20 mEq by mouth daily. 11/23/15  Yes Shirley Friar, PA-C  pramipexole (MIRAPEX) 0.5 MG tablet Take 0.5 mg by mouth 3 (three) times daily as needed.    Yes [provider]  predniSONE (DELTASONE) 10 MG tablet Take 10-40 mg by mouth daily with breakfast. 40 mg x2 days, 30 mg x2 day, 20 mg x2 days, 10 mg x2 days 10/22/17  Yes [provider]  torsemide (DEMADEX) 20 MG tablet Take 2 tablets (40 mg total) by mouth daily. 08/14/17  Yes Gladstone Lighter, MD    Inpatient Medications:  . atorvastatin  80 mg Oral QHS  . furosemide  80 mg Intravenous BID  . metoprolol tartrate  5 mg Intravenous Once   . sodium chloride 20 mL/hr at 10/28/17 1452  . heparin 1,200 Units/hr (10/28/17 1556)  . nitroGLYCERIN      Allergies: No Known Allergies  Social History   Socioeconomic History  .  Marital status: Married    Spouse name: Not on file  . Number of children: Not on file  . Years of education: Not on file  . Highest education level: Not on file  Occupational History  . Not on file  Social Needs  . Financial resource strain: Not on file  . Food insecurity:    Worry: Not on file    Inability: Not on file  . Transportation needs:    Medical: Not on file    Non-medical: Not on file  Tobacco Use  . Smoking status: Never Smoker  . Smokeless tobacco: Never Used  Substance and Sexual Activity  . Alcohol use: No  . Drug use: No  . Sexual activity: Yes  Lifestyle  . Physical activity:    Days per week: Not on file    Minutes per session: Not on file  . Stress: Not on file  Relationships  . Social connections:    Talks on phone: Not on file    Gets together: Not on file    Attends religious service: Not on file    Active member of club or organization: Not on file    Attends meetings of clubs or organizations: Not on file    Relationship status: Not on file  . Intimate partner violence:    Fear of  current or ex partner: Not on file    Emotionally abused: Not on file    Physically abused: Not on file    Forced sexual activity: Not on file  Other Topics Concern  . Not on file  Social History Narrative  . Not on file     Family History  Problem Relation Age of Onset  . Stroke Mother   . Heart attack Mother   . Hypertension Mother   . Heart attack Father   . Hypertension Father   . Heart attack Brother        #1  . Diabetes Brother        #1  . Heart disease Brother        #2  . Lung disease Brother        #2  . Hypertension Brother        #2  . Diabetes Brother        #2     Review of Systems: A 12-system review of systems was performed and is negative except as noted in the HPI.  Labs: Recent Labs    10/28/17 1442  TROPONINI 0.51*   Lab Results  Component Value Date   WBC 10.6 10/28/2017   HGB 11.9 (L) 10/28/2017   HCT 34.7 (L) 10/28/2017   MCV 87.9 10/28/2017   PLT 220 10/28/2017    Recent Labs  Lab 10/28/17 1442  NA 137  K 3.8  CL 105  CO2 20*  BUN 24*  CREATININE 1.04  CALCIUM 9.3  PROT 7.5  BILITOT 0.7  ALKPHOS 66  ALT 39  AST 46*  GLUCOSE 140*   Lab Results  Component Value Date   CHOL 140 10/28/2017   HDL 34 (L) 10/28/2017   LDLCALC 77 10/28/2017   TRIG 146 10/28/2017   No results found for: DDIMER  Radiology/Studies:  Dg Chest 2 View  Result Date: 10/22/2017 CLINICAL DATA:  62 year old male with chest pain. EXAM: CHEST - 2 VIEW COMPARISON:  Chest radiograph dated 08/10/2017 FINDINGS: There has been interval resolution of the previously seen left sided pneumonia. There is no focal consolidation, pleural effusion, or pneumothorax.  The cardiac silhouette is within normal limits. No acute osseous pathology. Median sternotomy wires and CABG vascular clips. IMPRESSION: No active cardiopulmonary disease. Electronically Signed   By: Anner Crete M.D.   On: 10/22/2017 00:49   US Abdomen Complete  Result Date: 10/07/2017 CLINICAL  DATA:  Diarrhea for 2 weeks. EXAM: ABDOMEN ULTRASOUND COMPLETE COMPARISON:  CT scan of March 24, 2017. FINDINGS: Gallbladder: No gallstones or wall thickening visualized. No sonographic Murphy sign noted by sonographer. Common bile duct: Diameter: 2.9 mm which is within normal limits. Liver: No focal lesion identified. Within normal limits in parenchymal echogenicity. Portal vein is patent on color Doppler imaging with normal direction of blood flow towards the liver. IVC: No abnormality visualized. Pancreas: Visualized portion unremarkable. Spleen: Size and appearance within normal limits. Right Kidney: Length: 14 cm. Echogenicity within normal limits. No mass or hydronephrosis visualized. Left Kidney: Length: 14.2 cm. Echogenicity within normal limits. No mass or hydronephrosis visualized. Abdominal aorta: No aneurysm visualized. Other findings: None. IMPRESSION: No definite abnormality seen in the abdomen. Electronically Signed   By: Marijo Conception, M.D.   On: 10/07/2017 14:35   Dg Chest Portable 1 View  Result Date: 10/28/2017 CLINICAL DATA:  Central and LEFT chest pain beginning this afternoon. History of myocardial infarction and recent cardiac stent placement. EXAM: PORTABLE CHEST 1 VIEW COMPARISON:  Chest radiograph July 24, 2017 FINDINGS: Cardiac silhouette is mildly enlarged and unchanged. Status post median sternotomy for CABG, chronically fractured proximal wires. Mild bronchitic changes without pleural effusion or focal consolidation. No pneumothorax. Soft tissue planes included osseous structures are nonsuspicious. IMPRESSION: Stable cardiomegaly.  Mild bronchitic changes. Electronically Signed   By: Elon Alas M.D.   On: 10/28/2017 15:20    Wt Readings from Last 3 Encounters:  10/22/17 127.5 kg (281 lb)  08/14/17 123.4 kg (272 lb)  07/31/17 128.8 kg (284 lb)    EKG: Sinus rhythm with ST depression in the inferolateral leads. Physical Exam:  Blood pressure 127/66, pulse 97,  temperature 98 F (36.7 C), temperature source Oral, resp. rate (!) 22, SpO2 92 %. There is no height or weight on file to calculate BMI. General: Well developed, well nourished, in no acute distress. Head: Normocephalic, atraumatic, sclera non-icteric, no xanthomas, nares are without discharge.  Neck: Negative for carotid bruits. JVD not elevated. Lungs: Clear bilaterally to auscultation without wheezes, rales, or rhonchi. Breathing is unlabored. Heart: RRR with S1 S2. No murmurs, rubs, or gallops appreciated. Abdomen: Soft, non-tender, non-distended with normoactive bowel sounds. No hepatomegaly. No rebound/guarding. No obvious abdominal masses. Msk:  Strength and tone appear normal for age. Extremities: No clubbing or cyanosis. No edema.  Distal pedal pulses are 2+ and equal bilaterally. Neuro: Alert and oriented X 3. No facial asymmetry. No focal deficit. Moves all extremities spontaneously. Psych:  Responds to questions appropriately with a normal affect.     Assessment and Plan  62 year old male with history of coronary artery disease with history of coronary bypass grafting including a occluded LAD, occluded proximal circumflex, 70% proximal RCA 60% mid RCA, he had a patent LIMA to the LAD, patent saphenous vein graft to diagonal patent saphenous vein graft to obtuse marginal 1, saphenous vein graft to proximal right coronary artery with a patent proximal stent and a significant lesion in the distal stent treated cutaneously last week.  He was discharged on dual antiplatelet therapy as well as Eliquis due to history of atrial fibrillation.  He had moderate aortic valvular stenosis.  This was  felt to be a candidate for medical management.  We will continue to treat with his antianginal as well as dual antiplatelet therapy.  Will discontinue Eliquis and consider left heart cath after 48 hours off of this drug.  Continue with IV heparin.  Further recommendations pending  course.  Signed, Teodoro Spray MD 10/28/2017, 4:36 PM Pager: [phone]

## 2017-10-28 NOTE — ED Notes (Signed)
ED MD ordered for drip to be lowered to 49mcg hr and paste put on

## 2017-10-28 NOTE — Progress Notes (Signed)
Chaplain responded to a code stemi. Pt was alert but in considerable pain. Chaplain maintained space and prayed for team and Pt. Pt was humorous and but concerned. His sibling has not been long died.He gave a family history about others who has suffered heart attacks. Chaplain maintained a pastoral presence. Chaplain let pt know that chaplain service are available 24/7.   10/28/17 1453  Clinical Encounter Type  Visited With Patient  Visit Type Initial;Code  Referral From Nurse  Spiritual Encounters  Spiritual Needs Prayer  Advance Directives (For Healthcare)  Does Patient Have a Medical Advance Directive? Yes  Type of Academic librarian  Mental Health Advance Directives  Does Patient Have a Mental Health Advance Directive? No

## 2017-10-28 NOTE — ED Notes (Signed)
Per [employer] waiting on RN for pt on floor and call from house supervisor. They will call me when they are ready for report.

## 2017-10-28 NOTE — ED Notes (Signed)
Date and time results received: 10/28/17 1550 (use smartphrase ".now" to insert current time)  Test: troponin Critical Value: 0.51  Name of Provider Notified: Quentin Cornwall  Orders Received? Or Actions Taken?: Orders Received - See Orders for details

## 2017-10-28 NOTE — ED Notes (Signed)
Report given to Rebecca RN.

## 2017-10-29 LAB — CBC
HEMATOCRIT: 31.9 % — AB (ref 40.0–52.0)
Hemoglobin: 10.9 g/dL — ABNORMAL LOW (ref 13.0–18.0)
MCH: 30.3 pg (ref 26.0–34.0)
MCHC: 34.2 g/dL (ref 32.0–36.0)
MCV: 88.5 fL (ref 80.0–100.0)
Platelets: 213 10*3/uL (ref 150–440)
RBC: 3.6 MIL/uL — ABNORMAL LOW (ref 4.40–5.90)
RDW: 15.1 % — AB (ref 11.5–14.5)
WBC: 10.5 10*3/uL (ref 3.8–10.6)

## 2017-10-29 LAB — BASIC METABOLIC PANEL
ANION GAP: 8 (ref 5–15)
BUN: 22 mg/dL — ABNORMAL HIGH (ref 6–20)
CO2: 24 mmol/L (ref 22–32)
Calcium: 8.9 mg/dL (ref 8.9–10.3)
Chloride: 105 mmol/L (ref 101–111)
Creatinine, Ser: 0.93 mg/dL (ref 0.61–1.24)
GFR calc Af Amer: >60 mL/min (ref >60)
GFR calc non Af Amer: >60 mL/min (ref >60)
GLUCOSE: 121 mg/dL — AB (ref 65–99)
POTASSIUM: 3.9 mmol/L (ref 3.5–5.1)
Sodium: 137 mmol/L (ref 135–145)

## 2017-10-29 LAB — APTT
aPTT: 47 seconds — ABNORMAL HIGH (ref 24–36)
aPTT: 56 seconds — ABNORMAL HIGH (ref 24–36)

## 2017-10-29 LAB — GLUCOSE, CAPILLARY
GLUCOSE-CAPILLARY: 130 mg/dL — AB (ref 65–99)
GLUCOSE-CAPILLARY: 112 mg/dL — AB (ref 65–99)
Glucose-Capillary: 102 mg/dL — ABNORMAL HIGH (ref 65–99)
Glucose-Capillary: 85 mg/dL (ref 65–99)

## 2017-10-29 LAB — LIPID PANEL
Cholesterol: 136 mg/dL (ref 0–200)
HDL: 34 mg/dL — ABNORMAL LOW (ref >40)
LDL Cholesterol: 81 mg/dL (ref 0–99)
Total CHOL/HDL Ratio: 4 RATIO
Triglycerides: 104 mg/dL (ref <150)
VLDL: 21 mg/dL (ref 0–40)

## 2017-10-29 LAB — TROPONIN I
TROPONIN I: 5.91 ng/mL — AB (ref <0.03)
TROPONIN I: 10.02 ng/mL — AB (ref <0.03)
TROPONIN I: 8.81 ng/mL — AB (ref <0.03)
Troponin I: 12.84 ng/mL (ref <0.03)
Troponin I: 11.83 ng/mL (ref <0.03)

## 2017-10-29 LAB — HEPARIN LEVEL (UNFRACTIONATED): HEPARIN UNFRACTIONATED: 0.5 [IU]/mL (ref 0.30–0.70)

## 2017-10-29 MED ORDER — SODIUM CHLORIDE 0.9% FLUSH
3.0000 mL | Freq: Two times a day (BID) | INTRAVENOUS | Status: DC
  Administered 2017-10-29 – 2017-10-30 (×2): 3 mL via INTRAVENOUS

## 2017-10-29 MED ORDER — SODIUM CHLORIDE 0.9 % WEIGHT BASED INFUSION
3.0000 mL/kg/h | INTRAVENOUS | Status: AC
  Administered 2017-10-30: 3 mL/kg/h via INTRAVENOUS

## 2017-10-29 MED ORDER — NITROGLYCERIN 0.4 MG SL SUBL: 0.4000 mg | SUBLINGUAL_TABLET | SUBLINGUAL | Status: DC | PRN

## 2017-10-29 MED ORDER — NITROGLYCERIN 2 % TD OINT
2.0000 [in_us] | TOPICAL_OINTMENT | Freq: Four times a day (QID) | TRANSDERMAL | Status: DC
  Administered 2017-10-29 – 2017-11-01 (×6): 2 [in_us] via TOPICAL
  Filled 2017-10-29 (×2): qty 1
  Filled 2017-10-29: qty 2
  Filled 2017-10-29 (×2): qty 1
  Filled 2017-10-29: qty 2
  Filled 2017-10-29 (×2): qty 1
  Filled 2017-10-29: qty 2

## 2017-10-29 MED ORDER — SODIUM CHLORIDE 0.9 % WEIGHT BASED INFUSION: 1.0000 mL/kg/h | INTRAVENOUS | Status: DC

## 2017-10-29 MED ORDER — PRAMIPEXOLE DIHYDROCHLORIDE 0.25 MG PO TABS
0.5000 mg | ORAL_TABLET | Freq: Three times a day (TID) | ORAL | Status: DC | PRN
  Administered 2017-10-31: 0.5 mg via ORAL
  Filled 2017-10-29: qty 2

## 2017-10-29 MED ORDER — GUAIFENESIN-DM 100-10 MG/5ML PO SYRP
5.0000 mL | ORAL_SOLUTION | ORAL | Status: DC | PRN
  Administered 2017-10-29 (×2): 5 mL via ORAL
  Filled 2017-10-29 (×3): qty 5

## 2017-10-29 MED ORDER — ZOLPIDEM TARTRATE 5 MG PO TABS
5.0000 mg | ORAL_TABLET | Freq: Every evening | ORAL | Status: DC | PRN
  Administered 2017-10-29 – 2017-10-31 (×3): 5 mg via ORAL
  Filled 2017-10-29 (×3): qty 1

## 2017-10-29 MED ORDER — SODIUM CHLORIDE 0.9% FLUSH: 3.0000 mL | INTRAVENOUS | Status: DC | PRN

## 2017-10-29 MED ORDER — ASPIRIN 81 MG PO CHEW
81.0000 mg | CHEWABLE_TABLET | ORAL | Status: AC
  Administered 2017-10-30: 81 mg via ORAL
  Filled 2017-10-29: qty 1

## 2017-10-29 MED ORDER — HYDROCOD POLST-CPM POLST ER 10-8 MG/5ML PO SUER
5.0000 mL | Freq: Two times a day (BID) | ORAL | Status: DC | PRN
  Administered 2017-10-29 – 2017-10-31 (×5): 5 mL via ORAL
  Filled 2017-10-29 (×5): qty 5

## 2017-10-29 NOTE — Progress Notes (Signed)
ANTICOAGULATION CONSULT NOTE - Initial Consult  Pharmacy Consult for heparin Indication: chest pain/ACS  No Known Allergies  Patient Measurements: Height: 5\' 7"  (170.2 cm) Weight: 285 lb 11.2 oz (129.6 kg) IBW/kg (Calculated) : 66.1 Heparin Dosing Weight: 98 kg  Vital Signs: Temp: 97.7 F (36.5 C) (05/15 2304) Temp Source: Oral (05/15 2304) BP: 101/55 (05/16 0109) Pulse Rate: 75 (05/16 0109)  Labs: Recent Labs    10/28/17 1442 10/28/17 1519 10/28/17 2011 10/28/17 2314  HGB 11.9*  --   --   --   HCT 34.7*  --   --   --   PLT 220  --   --   --   APTT 34  --   --  41*  LABPROT 13.4  --   --   --   INR 1.03  --   --   --   HEPARINUNFRC  --  0.82*  --   --   CREATININE 1.04  --   --   --   TROPONINI 0.51*  --  2.69* 5.91*    Estimated Creatinine Clearance: 95.3 mL/min (by C-G formula based on SCr of 1.04 mg/dL).   Medical History: Past Medical History:  Diagnosis Date  . A-fib ([place])   . Aortic stenosis, moderate 11/2015  . Arthritis   . CAD (coronary artery disease)   . Chronic systolic CHF (congestive heart failure) ([place])   . Diabetes mellitus without complication ([place])    INSULIN DEPENDENT  . GERD (gastroesophageal reflux disease)   . History of cardioversion   . Hypertension   . MI (myocardial infarction) ([place])   . OSA on CPAP   . Sepsis ([place])   . Shortness of breath dyspnea   . Stroke ([place])   . SVT (supraventricular tachycardia) ([facility])     Medications:  Infusions:  . sodium chloride 20 mL/hr at 10/28/17 1452  . heparin 1,200 Units/hr (10/28/17 1556)  . nitroGLYCERIN 55 mcg/min (10/28/17 2345)    Assessment: 73 yom with CP. Took nitroglycerin x 3 with no relief. Had MI with stent stent placed 5 days PTA. EKG with sinus tachycardia with Premature atrial complexes; Marked ST abnormality, possible inferior subendocardial injury; Marked ST abnormality, possible anterolateral subendocardial injury. No troponin available at this time. Pharmacy consulted to  dose heparin for ACS. PTA list includes Eliquis but needs to be verified. Will add-on Heparin level to baseline labs and dose based on aPTT if elevated.  Goal of Therapy:  Heparin level 0.3-0.7 units/ml aPTT 68 to 109 seconds Monitor platelets by anticoagulation protocol: Yes   Plan:  Give 4000 units bolus x 1 Start heparin infusion at 1200 units/hr Check aPTT level in 6 hours and daily while on heparin. Check HL daily until aPTT and HL correlate, per protocol. Continue to monitor H&H and platelets  10/28/17 15:58 baseline HL elevated at 0.82. Continue current rate, will adjust dose based on aPTT until aPTT and HL correlate.  0515 2300 aPTT 41. Increase rate to 1300 units/hr. Recheck aPTT/CBC/HL in 6 hours.  Deven Audi S, Pharm.D., BCPS Clinical Pharmacist 10/29/2017,1:16 AM

## 2017-10-29 NOTE — Progress Notes (Signed)
Patient Name: Marcus Williams Date of Encounter: 10/29/2017  Hospital Problem List     Active Problems:   NSTEMI (non-ST elevated myocardial infarction) [facility])    Patient Profile     62 year old male with history of mild to moderate aortic stenosis with coronary artery disease status post coronary artery bypass grafting admitted with recurrent chest pain.  Patient had a PCI of the saphenous vein graft to RCA 1 to 2 weeks ago.  Ruled in for non-ST elevation myocardial infarction.  Currently stable.  Was on Eliquis.  We will plan to proceed with a left heart cath tomorrow mid morning to evaluate coronary anatomy.  Subjective   Currently pain-free  Inpatient Medications    . atorvastatin  80 mg Oral QHS  . clopidogrel  75 mg Oral Q breakfast  . diltiazem  180 mg Oral BID  . DULoxetine  20 mg Oral Daily  . furosemide  80 mg Intravenous BID  . insulin aspart  0-20 Units Subcutaneous TID WC  . insulin aspart  0-5 Units Subcutaneous QHS  . insulin aspart protamine- aspart  60 Units Subcutaneous BID WC  . lisinopril  10 mg Oral Daily  . metoprolol tartrate  100 mg Oral BID  . mometasone-formoterol  2 puff Inhalation BID  . pantoprazole  40 mg Oral Daily  . potassium chloride SA  20 mEq Oral Daily  . pramipexole  0.5 mg Oral TID  . torsemide  40 mg Oral Daily    Vital Signs    Vitals:   10/29/17 0109 10/29/17 0353 10/29/17 0747 10/29/17 1153  BP: (!) 101/55 119/74 124/72 103/61  Pulse: 75 63 75 62  Resp:  17    Temp:  98.1 F (36.7 C) 98.3 F (36.8 C) 98.4 F (36.9 C)  TempSrc:   Oral Oral  SpO2:  98% 96% 96%  Weight:  128.5 kg (283 lb 3.2 oz)    Height:        Intake/Output Summary (Last 24 hours) at 10/29/2017 1340 Last data filed at 10/29/2017 1310 Gross per 24 hour  Intake 725.95 ml  Output 4050 ml  Net -3324.05 ml   Filed Weights   10/28/17 2304 10/29/17 0353  Weight: 129.6 kg (285 lb 11.2 oz) 128.5 kg (283 lb 3.2 oz)    Physical Exam    GEN: Well  nourished, well developed, in no acute distress.  HEENT: normal.  Neck: Supple, no JVD, carotid bruits, or masses. Cardiac: RRR, no murmurs, rubs, or gallops. No clubbing, cyanosis, edema.  Radials/DP/PT 2+ and equal bilaterally.  Respiratory:  Respirations regular and unlabored, clear to auscultation bilaterally. GI: Soft, nontender, nondistended, BS + x 4. MS: no deformity or atrophy. Skin: warm and dry, no rash. Neuro:  Strength and sensation are intact. Psych: Normal affect.  Labs    CBC Recent Labs    10/28/17 1442 10/29/17 0826  WBC 10.6 10.5  NEUTROABS 9.5*  --   HGB 11.9* 10.9*  HCT 34.7* 31.9*  MCV 87.9 88.5  PLT 220 294   Basic Metabolic Panel Recent Labs    10/28/17 1442 10/29/17 0245  NA 137 137  K 3.8 3.9  CL 105 105  CO2 20* 24  GLUCOSE 140* 121*  BUN 24* 22*  CREATININE 1.04 0.93  CALCIUM 9.3 8.9   Liver Function Tests Recent Labs    10/28/17 1442  AST 46*  ALT 39  ALKPHOS 66  BILITOT 0.7  PROT 7.5  ALBUMIN 3.7   No  results for input(s): LIPASE, AMYLASE in the last 72 hours. Cardiac Enzymes Recent Labs    10/28/17 2314 10/29/17 0245 10/29/17 0826  TROPONINI 5.91* 10.02* 12.84*   BNP No results for input(s): BNP in the last 72 hours. D-Dimer No results for input(s): DDIMER in the last 72 hours. Hemoglobin A1C No results for input(s): HGBA1C in the last 72 hours. Fasting Lipid Panel Recent Labs    10/29/17 0245  CHOL 136  HDL 34*  LDLCALC 81  TRIG 104  CHOLHDL 4.0   Thyroid Function Tests No results for input(s): TSH, T4TOTAL, T3FREE, THYROIDAB in the last 72 hours.  Invalid input(s): FREET3  Telemetry    Normal sinus rhythm with no ischemia  ECG    Sinus rhythm with ST depression inferior lateral leads.  Radiology    Dg Chest 2 View  Result Date: 10/22/2017 CLINICAL DATA:  62 year old male with chest pain. EXAM: CHEST - 2 VIEW COMPARISON:  Chest radiograph dated 08/10/2017 FINDINGS: There has been interval  resolution of the previously seen left sided pneumonia. There is no focal consolidation, pleural effusion, or pneumothorax. The cardiac silhouette is within normal limits. No acute osseous pathology. Median sternotomy wires and CABG vascular clips. IMPRESSION: No active cardiopulmonary disease. Electronically Signed   By: Anner Crete M.D.   On: 10/22/2017 00:49   US Abdomen Complete  Result Date: 10/07/2017 CLINICAL DATA:  Diarrhea for 2 weeks. EXAM: ABDOMEN ULTRASOUND COMPLETE COMPARISON:  CT scan of March 24, 2017. FINDINGS: Gallbladder: No gallstones or wall thickening visualized. No sonographic Murphy sign noted by sonographer. Common bile duct: Diameter: 2.9 mm which is within normal limits. Liver: No focal lesion identified. Within normal limits in parenchymal echogenicity. Portal vein is patent on color Doppler imaging with normal direction of blood flow towards the liver. IVC: No abnormality visualized. Pancreas: Visualized portion unremarkable. Spleen: Size and appearance within normal limits. Right Kidney: Length: 14 cm. Echogenicity within normal limits. No mass or hydronephrosis visualized. Left Kidney: Length: 14.2 cm. Echogenicity within normal limits. No mass or hydronephrosis visualized. Abdominal aorta: No aneurysm visualized. Other findings: None. IMPRESSION: No definite abnormality seen in the abdomen. Electronically Signed   By: Marijo Conception, M.D.   On: 10/07/2017 14:35   Dg Chest Portable 1 View  Result Date: 10/28/2017 CLINICAL DATA:  Central and LEFT chest pain beginning this afternoon. History of myocardial infarction and recent cardiac stent placement. EXAM: PORTABLE CHEST 1 VIEW COMPARISON:  Chest radiograph July 24, 2017 FINDINGS: Cardiac silhouette is mildly enlarged and unchanged. Status post median sternotomy for CABG, chronically fractured proximal wires. Mild bronchitic changes without pleural effusion or focal consolidation. No pneumothorax. Soft tissue planes  included osseous structures are nonsuspicious. IMPRESSION: Stable cardiomegaly.  Mild bronchitic changes. Electronically Signed   By: Elon Alas M.D.   On: 10/28/2017 15:20    Assessment & Plan    Coronary artery disease-ruled in for non-ST elevation myocardial infarction.  Will need to evaluate with cardiac catheterization to determine if there is recurrent progressive coronary disease.  Patient is status post PCI of saphenous vein to RCA done on Oct 23, 2017.  He was discharged approximately 4 to 5 days ago.  He has been compliant with aspirin and Plavix.  Patient's last Eliquis was yesterday morning.  Will midmorning tomorrow to evaluate left heart cath to determine patency of stent progression of further coronary disease.  Will attempt to wean off of IV nitro to Nitropaste.  We will continue with heparin until cath  in the morning.  Signed, Javier Docker Kendra Woolford MD 10/29/2017, 1:40 PM  Pager: [phone]

## 2017-10-29 NOTE — Progress Notes (Signed)
ANTICOAGULATION CONSULT NOTE - Initial Consult  Pharmacy Consult for heparin Indication: chest pain/ACS  No Known Allergies  Patient Measurements: Height: 5\' 7"  (170.2 cm) Weight: 283 lb 3.2 oz (128.5 kg) IBW/kg (Calculated) : 66.1 Heparin Dosing Weight: 98 kg  Vital Signs: Temp: 98.3 F (36.8 C) (05/16 0747) Temp Source: Oral (05/16 0747) BP: 124/72 (05/16 0747) Pulse Rate: 75 (05/16 0747)  Labs: Recent Labs    10/28/17 1442 10/28/17 1519 10/28/17 2011 10/28/17 2314 10/29/17 0245 10/29/17 0826  HGB 11.9*  --   --   --   --  10.9*  HCT 34.7*  --   --   --   --  31.9*  PLT 220  --   --   --   --  213  APTT 34  --   --  41*  --  47*  LABPROT 13.4  --   --   --   --   --   INR 1.03  --   --   --   --   --   HEPARINUNFRC  --  0.82*  --   --   --  0.50  CREATININE 1.04  --   --   --  0.93  --   TROPONINI 0.51*  --  2.69* 5.91* 10.02*  --     Estimated Creatinine Clearance: 106.1 mL/min (by C-G formula based on SCr of 0.93 mg/dL).   Medical History: Past Medical History:  Diagnosis Date  . A-fib ([place])   . Aortic stenosis, moderate 11/2015  . Arthritis   . CAD (coronary artery disease)   . Chronic systolic CHF (congestive heart failure) ([place])   . Diabetes mellitus without complication ([place])    INSULIN DEPENDENT  . GERD (gastroesophageal reflux disease)   . History of cardioversion   . Hypertension   . MI (myocardial infarction) ([place])   . OSA on CPAP   . Sepsis ([place])   . Shortness of breath dyspnea   . Stroke ([place])   . SVT (supraventricular tachycardia) ([facility])     Medications:  Infusions:  . sodium chloride 20 mL/hr at 10/28/17 1452  . heparin 1,300 Units/hr (10/29/17 0159)  . nitroGLYCERIN 55 mcg/min (10/28/17 2345)    Assessment: 70 yom with CP. Took nitroglycerin x 3 with no relief. Had MI with stent stent placed 5 days PTA. EKG with sinus tachycardia with Premature atrial complexes; Marked ST abnormality, possible inferior subendocardial injury;  Marked ST abnormality, possible anterolateral subendocardial injury. No troponin available at this time. Pharmacy consulted to dose heparin for ACS. PTA list includes Eliquis but needs to be verified. Will add-on Heparin level to baseline labs and dose based on aPTT if elevated.  Goal of Therapy:  Heparin level 0.3-0.7 units/ml aPTT 68 to 109 seconds Monitor platelets by anticoagulation protocol: Yes   Plan:  Give 4000 units bolus x 1 Start heparin infusion at 1200 units/hr Check aPTT level in 6 hours and daily while on heparin. Check HL daily until aPTT and HL correlate, per protocol. Continue to monitor H&H and platelets  10/28/17 15:58 baseline HL elevated at 0.82. Continue current rate, will adjust dose based on aPTT until aPTT and HL correlate.  0515 2300 aPTT 41. Increase rate to 1300 units/hr. Recheck aPTT/CBC/HL in 6 hours.  10/29/17 08:26 aPTT 47, HL 0.5. Levels do not correlate. Dose based on aPTT. APTT subtherapeutic. Increase to 1400 units/hr. Recheck aPTT in 6 hours. Continue to check HL daily until levels correlate.  Ovid Curd  A Matisha Termine, Pharm.D., BCPS Clinical Pharmacist 10/29/2017,8:56 AM

## 2017-10-29 NOTE — Progress Notes (Signed)
ANTICOAGULATION CONSULT NOTE - Initial Consult  Pharmacy Consult for heparin Indication: chest pain/ACS  No Known Allergies  Patient Measurements: Height: 5\' 7"  (170.2 cm) Weight: 283 lb 3.2 oz (128.5 kg) IBW/kg (Calculated) : 66.1 Heparin Dosing Weight: 98 kg  Vital Signs: Temp: 98.4 F (36.9 C) (05/16 1630) Temp Source: Oral (05/16 1630) BP: 110/67 (05/16 1630) Pulse Rate: 63 (05/16 1630)  Labs: Recent Labs    10/28/17 1442 10/28/17 1519  10/28/17 2314 10/29/17 0245 10/29/17 0826 10/29/17 1436 10/29/17 1904  HGB 11.9*  --   --   --   --  10.9*  --   --   HCT 34.7*  --   --   --   --  31.9*  --   --   PLT 220  --   --   --   --  213  --   --   APTT 34  --   --  41*  --  47*  --  56*  LABPROT 13.4  --   --   --   --   --   --   --   INR 1.03  --   --   --   --   --   --   --   HEPARINUNFRC  --  0.82*  --   --   --  0.50  --   --   CREATININE 1.04  --   --   --  0.93  --   --   --   TROPONINI 0.51*  --    < > 5.91* 10.02* 12.84* 11.83*  --    < > = values in this interval not displayed.    Estimated Creatinine Clearance: 106.1 mL/min (by C-G formula based on SCr of 0.93 mg/dL).   Medical History: Past Medical History:  Diagnosis Date  . A-fib ([place])   . Aortic stenosis, moderate 11/2015  . Arthritis   . CAD (coronary artery disease)   . Chronic systolic CHF (congestive heart failure) ([place])   . Diabetes mellitus without complication ([place])    INSULIN DEPENDENT  . GERD (gastroesophageal reflux disease)   . History of cardioversion   . Hypertension   . MI (myocardial infarction) ([place])   . OSA on CPAP   . Sepsis ([place])   . Shortness of breath dyspnea   . Stroke ([place])   . SVT (supraventricular tachycardia) ([facility])     Medications:  Infusions:  . sodium chloride 20 mL/hr at 10/28/17 1452  . heparin 1,400 Units/hr (10/29/17 1227)    Assessment: 10 yom with CP. Took nitroglycerin x 3 with no relief. Had MI with stent stent placed 5 days PTA. EKG with sinus  tachycardia with Premature atrial complexes; Marked ST abnormality, possible inferior subendocardial injury; Marked ST abnormality, possible anterolateral subendocardial injury. No troponin available at this time. Pharmacy consulted to dose heparin for ACS. PTA list includes Eliquis but needs to be verified. Will add-on Heparin level to baseline labs and dose based on aPTT if elevated.  Goal of Therapy:  Heparin level 0.3-0.7 units/ml aPTT 68 to 109 seconds Monitor platelets by anticoagulation protocol: Yes   Plan:  Will increase heparin infusion to 1600 units/hr. Next HL/aPTT in 6 hours.   Ulice Dash, PharmD Clinical Pharmacist  10/29/2017,7:29 PM

## 2017-10-29 NOTE — Progress Notes (Addendum)
Try to start weaning nitro gtt per Dr. Posey Pronto. PRN SL nitro.  Order for 38mL tussionex BID PRN per Dr. Posey Pronto.

## 2017-10-29 NOTE — Progress Notes (Signed)
CBG 102 per Keda NT. Glucometer not synched yet.

## 2017-10-29 NOTE — Progress Notes (Signed)
Confirmed with Hank, Pharmacist - still give plavix while on heparin gtt.

## 2017-10-29 NOTE — Progress Notes (Addendum)
Per Dr. Ubaldo Glassing, continue to wean nitro drip every 20-30 minutes and place 2 inches nitro paste on patient after drip has stopped.

## 2017-10-29 NOTE — Progress Notes (Signed)
[facility] [place] NAME: Marcus Williams    MR#:  [id number]  DATE OF BIRTH:  [birth year]  SUBJECTIVE:  C/o cough No cp on nitroglycerin drip trying to wean it off.  REVIEW OF SYSTEMS:   Review of Systems  Constitutional: Negative for chills, fever and weight loss.  HENT: Negative for ear discharge, ear pain and nosebleeds.   Eyes: Negative for blurred vision, pain and discharge.  Respiratory: Positive for cough and shortness of breath. Negative for sputum production, wheezing and stridor.   Cardiovascular: Negative for chest pain, palpitations, orthopnea and PND.  Gastrointestinal: Negative for abdominal pain, diarrhea, nausea and vomiting.  Genitourinary: Negative for frequency and urgency.  Musculoskeletal: Negative for back pain and joint pain.  Neurological: Negative for sensory change, speech change, focal weakness and weakness.  Psychiatric/Behavioral: Negative for depression and hallucinations. The patient is not nervous/anxious.    Tolerating Diet:yes Tolerating PT: ambulatory  DRUG ALLERGIES:  No Known Allergies  VITALS:  Blood pressure 103/61, pulse 62, temperature 98.4 F (36.9 C), temperature source Oral, resp. rate 17, height 5\' 7"  (1.702 m), weight 128.5 kg (283 lb 3.2 oz), SpO2 96 %.  PHYSICAL EXAMINATION:   Physical Exam  GENERAL:  62 y.o.-year-old patient lying in the bed with no acute distress. obese EYES: Pupils equal, round, reactive to light and accommodation. No scleral icterus. Extraocular muscles intact.  HEENT: Head atraumatic, normocephalic. Oropharynx and nasopharynx clear.  NECK:  Supple, no jugular venous distention. No thyroid enlargement, no tenderness.  LUNGS: Normal breath sounds bilaterally, no wheezing, rales, rhonchi. No use of accessory muscles of respiration.  CARDIOVASCULAR: S1, S2 normal. No murmurs, rubs, or gallops.  ABDOMEN: Soft, nontender, nondistended. Bowel sounds present. No  organomegaly or mass. Abdominal obesity EXTREMITIES: No cyanosis, clubbing or edema b/l.    NEUROLOGIC: Cranial nerves II through XII are intact. No focal Motor or sensory deficits b/l.   PSYCHIATRIC:  patient is alert and oriented x 3.  SKIN: No obvious rash, lesion, or ulcer.   LABORATORY PANEL:  CBC Recent Labs  Lab 10/29/17 0826  WBC 10.5  HGB 10.9*  HCT 31.9*  PLT 213    Chemistries  Recent Labs  Lab 10/28/17 1442 10/29/17 0245  NA 137 137  K 3.8 3.9  CL 105 105  CO2 20* 24  GLUCOSE 140* 121*  BUN 24* 22*  CREATININE 1.04 0.93  CALCIUM 9.3 8.9  AST 46*  --   ALT 39  --   ALKPHOS 66  --   BILITOT 0.7  --    Cardiac Enzymes Recent Labs  Lab 10/29/17 1436  TROPONINI 11.83*   RADIOLOGY:  Dg Chest Portable 1 View  Result Date: 10/28/2017 CLINICAL DATA:  Central and LEFT chest pain beginning this afternoon. History of myocardial infarction and recent cardiac stent placement. EXAM: PORTABLE CHEST 1 VIEW COMPARISON:  Chest radiograph July 24, 2017 FINDINGS: Cardiac silhouette is mildly enlarged and unchanged. Status post median sternotomy for CABG, chronically fractured proximal wires. Mild bronchitic changes without pleural effusion or focal consolidation. No pneumothorax. Soft tissue planes included osseous structures are nonsuspicious. IMPRESSION: Stable cardiomegaly.  Mild bronchitic changes. Electronically Signed   By: Elon Alas M.D.   On: 10/28/2017 15:20   ASSESSMENT AND PLAN:   Marcus Williams  is a 62 y.o. male with a known history per below which also includes recent non-STEMI-discharged home 5 days ago status post stent placement, heart catheterization done May 10 noted  for diffuse coronary artery disease, presenting with acute intermittent chest pain since yesterday, patient states that it is sharp, lasted few seconds, nonradiating, dyspnea on exertion, associated with shortness of breath, no better with nitroglycerin taken 3 times earlier  today  *Acute recurrent chest pain with coronary artery disease status post CABG and coronary artery stenting -Most likely secondary to acute non-STEMI, hospitalization last week for non-STEMI status post heart catheterization noted for diffuse coronary artery disease, status post stenting at that time -cont asa, statin therapy, Plavix - Eliquis currently on hold given recent spinal injection on yesterday -cont lisinopril, Lopressor, -IV heparin drip will be continued,  Wean nitroglycerin drip, -appreciate cardiology input by Dr. Ubaldo Glassing -pt will get his cardiac cath on Friday   \*Acute on chronic systolic/diastolic congestive heart failure exacerbation Congestive heart failure protocol, IV Lasix, lisinopril, Lopressor, Plavix, statin therapy, strict I&O monitoring, daily weights  *History of paroxysmal A. Fib Currently in sinus rhythm Eliquis currently on hold for cardiac cath plan on Friday  continue Cardizem, metoprolol  *Chronic diabetes mellitus type 2 Stable Hold metformin, continue NovoLog at reduced dose of 60 units twice daily, sliding scale insulin with Accu-Cheks per routine  *Chronic obstructive sleep apnea May use home device at bedtime/as needed  *COPD without exacerbation Breathing treatments as needed  *Extreme morbid obesity Stable lifestyle modification recommended    Case discussed with Care Management/Social Worker. Management plans discussed with the patient, family and they are in agreement.  CODE STATUS: full  DVT Prophylaxis: heparin  TOTAL TIME TAKING CARE OF THIS PATIENT: *30* minutes.  >50% time spent on counselling and coordination of care  POSSIBLE D/C IN *2 to 3* DAYS, DEPENDING ON CLINICAL CONDITION.  Note: This dictation was prepared with Dragon dictation along with smaller phrase technology. Any transcriptional errors that result from this process are unintentional.  Fritzi Mandes M.D on 10/29/2017 at 3:16 PM  Between 7am to 6pm -  Pager - [phone]  After 6pm go to [email or web address] - password [email or web address] [facility]  Office  [phone]  CC: Primary care physician; Idelle Crouch, MDPatient ID: Marcus Williams, male   DOB: 07/11/1955, 62 y.o.   MRN: [id number]

## 2017-10-30 ENCOUNTER — Inpatient Hospital Stay: Payer: Medicare HMO

## 2017-10-30 ENCOUNTER — Encounter
Admission: EM | Disposition: A | Discharge status: Home / Self Care | Payer: Self-pay | Source: Home / Self Care | Attending: Internal Medicine

## 2017-10-30 ENCOUNTER — Encounter: Payer: Self-pay | Admitting: *Deleted

## 2017-10-30 DIAGNOSIS — J9600 Acute respiratory failure, unspecified whether with hypoxia or hypercapnia: Secondary | ICD-10-CM

## 2017-10-30 HISTORY — PX: LEFT HEART CATH AND CORONARY ANGIOGRAPHY: CATH118249

## 2017-10-30 LAB — GLUCOSE, CAPILLARY
GLUCOSE-CAPILLARY: 97 mg/dL (ref 65–99)
GLUCOSE-CAPILLARY: 172 mg/dL — AB (ref 65–99)
GLUCOSE-CAPILLARY: 131 mg/dL — AB (ref 65–99)
Glucose-Capillary: 113 mg/dL — ABNORMAL HIGH (ref 65–99)
Glucose-Capillary: 180 mg/dL — ABNORMAL HIGH (ref 65–99)
Glucose-Capillary: 170 mg/dL — ABNORMAL HIGH (ref 65–99)

## 2017-10-30 LAB — MRSA PCR SCREENING: MRSA BY PCR: NEGATIVE

## 2017-10-30 LAB — CBC WITH DIFFERENTIAL/PLATELET
BASOS ABS: 0.1 10*3/uL (ref 0–0.1)
BASOS PCT: 0 %
Eosinophils Absolute: 0.2 10*3/uL (ref 0–0.7)
Eosinophils Relative: 1 %
HCT: 41.9 % (ref 40.0–52.0)
HEMOGLOBIN: 14.1 g/dL (ref 13.0–18.0)
Lymphocytes Relative: 4 %
Lymphs Abs: 0.7 10*3/uL — ABNORMAL LOW (ref 1.0–3.6)
MCH: 29.7 pg (ref 26.0–34.0)
MCHC: 33.5 g/dL (ref 32.0–36.0)
MCV: 88.7 fL (ref 80.0–100.0)
Monocytes Absolute: 1.3 10*3/uL — ABNORMAL HIGH (ref 0.2–1.0)
Monocytes Relative: 8 %
NEUTROS ABS: 14.1 10*3/uL — AB (ref 1.4–6.5)
NEUTROS PCT: 87 %
Platelets: 274 10*3/uL (ref 150–440)
RBC: 4.73 MIL/uL (ref 4.40–5.90)
RDW: 15.5 % — ABNORMAL HIGH (ref 11.5–14.5)
WBC: 16.3 10*3/uL — AB (ref 3.8–10.6)

## 2017-10-30 LAB — CBC
HEMATOCRIT: 32.6 % — AB (ref 40.0–52.0)
HEMOGLOBIN: 11.4 g/dL — AB (ref 13.0–18.0)
MCH: 30.7 pg (ref 26.0–34.0)
MCHC: 35 g/dL (ref 32.0–36.0)
MCV: 87.7 fL (ref 80.0–100.0)
Platelets: 226 10*3/uL (ref 150–440)
RBC: 3.71 MIL/uL — ABNORMAL LOW (ref 4.40–5.90)
RDW: 15.5 % — ABNORMAL HIGH (ref 11.5–14.5)
WBC: 8.6 10*3/uL (ref 3.8–10.6)

## 2017-10-30 LAB — COMPREHENSIVE METABOLIC PANEL
ALBUMIN: 3.7 g/dL (ref 3.5–5.0)
ALK PHOS: 65 U/L (ref 38–126)
ALT: 37 U/L (ref 17–63)
AST: 42 U/L — AB (ref 15–41)
Anion gap: 12 (ref 5–15)
BILIRUBIN TOTAL: 1.4 mg/dL — AB (ref 0.3–1.2)
BUN: 29 mg/dL — AB (ref 6–20)
CO2: 23 mmol/L (ref 22–32)
Calcium: 8.5 mg/dL — ABNORMAL LOW (ref 8.9–10.3)
Chloride: 102 mmol/L (ref 101–111)
Creatinine, Ser: 1.21 mg/dL (ref 0.61–1.24)
GFR calc Af Amer: >60 mL/min (ref >60)
GFR calc non Af Amer: >60 mL/min (ref >60)
GLUCOSE: 174 mg/dL — AB (ref 65–99)
POTASSIUM: 4.5 mmol/L (ref 3.5–5.1)
SODIUM: 137 mmol/L (ref 135–145)
TOTAL PROTEIN: 7.3 g/dL (ref 6.5–8.1)

## 2017-10-30 LAB — APTT: APTT: 70 s — AB (ref 24–36)

## 2017-10-30 LAB — HEPARIN LEVEL (UNFRACTIONATED): Heparin Unfractionated: 0.52 IU/mL (ref 0.30–0.70)

## 2017-10-30 SURGERY — LEFT HEART CATH AND CORONARY ANGIOGRAPHY
Anesthesia: Moderate Sedation

## 2017-10-30 MED ORDER — DOPAMINE-DEXTROSE 3.2-5 MG/ML-% IV SOLN: 0.0000 ug/kg/min | INTRAVENOUS | Status: DC

## 2017-10-30 MED ORDER — ASPIRIN 81 MG PO CHEW: 81.0000 mg | CHEWABLE_TABLET | ORAL | Status: DC

## 2017-10-30 MED ORDER — IOPAMIDOL (ISOVUE-300) INJECTION 61%
INTRAVENOUS | Status: DC | PRN
  Administered 2017-10-30: 105 mL via INTRA_ARTERIAL

## 2017-10-30 MED ORDER — SODIUM CHLORIDE 0.9 % IV SOLN
INTRAVENOUS | Status: AC | PRN
  Administered 2017-10-30: 250 mL via INTRAVENOUS

## 2017-10-30 MED ORDER — SODIUM CHLORIDE 0.9 % WEIGHT BASED INFUSION: 3.0000 mL/kg/h | INTRAVENOUS | Status: DC

## 2017-10-30 MED ORDER — METHYLPREDNISOLONE SODIUM SUCC 125 MG IJ SOLR
INTRAMUSCULAR | Status: AC
  Filled 2017-10-30: qty 2

## 2017-10-30 MED ORDER — METHYLPREDNISOLONE SODIUM SUCC 125 MG IJ SOLR
INTRAMUSCULAR | Status: DC | PRN
  Administered 2017-10-30: 125 mg via INTRAVENOUS

## 2017-10-30 MED ORDER — DIPHENHYDRAMINE HCL 50 MG/ML IJ SOLN
INTRAMUSCULAR | Status: AC
  Filled 2017-10-30: qty 1

## 2017-10-30 MED ORDER — FAMOTIDINE IN NACL 20-0.9 MG/50ML-% IV SOLN
20.0000 mg | Freq: Two times a day (BID) | INTRAVENOUS | Status: DC
  Administered 2017-10-30 – 2017-10-31 (×2): 20 mg via INTRAVENOUS
  Filled 2017-10-30 (×2): qty 50

## 2017-10-30 MED ORDER — ACETAMINOPHEN 325 MG PO TABS
650.0000 mg | ORAL_TABLET | ORAL | Status: DC | PRN
  Administered 2017-10-31: 650 mg via ORAL

## 2017-10-30 MED ORDER — FAMOTIDINE 20 MG PO TABS
ORAL_TABLET | ORAL | Status: AC
  Filled 2017-10-30: qty 1

## 2017-10-30 MED ORDER — DOPAMINE-DEXTROSE 3.2-5 MG/ML-% IV SOLN
INTRAVENOUS | Status: AC | PRN
  Administered 2017-10-30: 10 ug/kg/min via INTRAVENOUS

## 2017-10-30 MED ORDER — HEPARIN (PORCINE) IN NACL 1000-0.9 UT/500ML-% IV SOLN
INTRAVENOUS | Status: AC
  Filled 2017-10-30: qty 1000

## 2017-10-30 MED ORDER — ONDANSETRON HCL 4 MG/2ML IJ SOLN
INTRAMUSCULAR | Status: DC | PRN
  Administered 2017-10-30: 4 mg via INTRAVENOUS

## 2017-10-30 MED ORDER — FAMOTIDINE IN NACL 20-0.9 MG/50ML-% IV SOLN
20.0000 mg | Freq: Once | INTRAVENOUS | Status: AC
  Administered 2017-10-30: 20 mg via INTRAVENOUS
  Filled 2017-10-30 (×2): qty 50

## 2017-10-30 MED ORDER — SODIUM CHLORIDE 0.9% FLUSH: 3.0000 mL | INTRAVENOUS | Status: DC | PRN

## 2017-10-30 MED ORDER — DIPHENHYDRAMINE HCL 50 MG/ML IJ SOLN
INTRAMUSCULAR | Status: DC | PRN
  Administered 2017-10-30: 50 mg via INTRAVENOUS

## 2017-10-30 MED ORDER — DOPAMINE-DEXTROSE 3.2-5 MG/ML-% IV SOLN
INTRAVENOUS | Status: AC
  Filled 2017-10-30: qty 250

## 2017-10-30 MED ORDER — SODIUM CHLORIDE 0.9 % WEIGHT BASED INFUSION
1.0000 mL/kg/h | INTRAVENOUS | Status: AC
  Administered 2017-10-30: 1 mL/kg/h via INTRAVENOUS

## 2017-10-30 MED ORDER — SODIUM CHLORIDE 0.9 % WEIGHT BASED INFUSION: 1.0000 mL/kg/h | INTRAVENOUS | Status: DC

## 2017-10-30 MED ORDER — SODIUM CHLORIDE 0.9 % IV SOLN: 250.0000 mL | INTRAVENOUS | Status: DC | PRN

## 2017-10-30 MED ORDER — ONDANSETRON HCL 4 MG/2ML IJ SOLN
INTRAMUSCULAR | Status: AC
  Filled 2017-10-30: qty 2

## 2017-10-30 MED ORDER — APIXABAN 5 MG PO TABS
5.0000 mg | ORAL_TABLET | Freq: Two times a day (BID) | ORAL | Status: DC
  Administered 2017-10-31 – 2017-11-01 (×3): 5 mg via ORAL
  Filled 2017-10-30 (×3): qty 1

## 2017-10-30 MED ORDER — ONDANSETRON HCL 4 MG/2ML IJ SOLN: 4.0000 mg | Freq: Four times a day (QID) | INTRAMUSCULAR | Status: DC | PRN

## 2017-10-30 MED ORDER — SODIUM CHLORIDE 0.9% FLUSH
3.0000 mL | Freq: Two times a day (BID) | INTRAVENOUS | Status: DC
  Administered 2017-10-31 – 2017-11-01 (×4): 3 mL via INTRAVENOUS

## 2017-10-30 MED ORDER — LIDOCAINE HCL (PF) 1 % IJ SOLN
INTRAMUSCULAR | Status: AC
  Filled 2017-10-30: qty 30

## 2017-10-30 MED ORDER — METHYLPREDNISOLONE SODIUM SUCC 125 MG IJ SOLR
60.0000 mg | Freq: Four times a day (QID) | INTRAMUSCULAR | Status: DC
  Administered 2017-10-30 – 2017-10-31 (×3): 60 mg via INTRAVENOUS
  Filled 2017-10-30 (×3): qty 2

## 2017-10-30 MED ORDER — MIDAZOLAM HCL 2 MG/2ML IJ SOLN
INTRAMUSCULAR | Status: DC | PRN
  Administered 2017-10-30: 1 mg via INTRAVENOUS

## 2017-10-30 MED ORDER — MIDAZOLAM HCL 2 MG/2ML IJ SOLN
INTRAMUSCULAR | Status: AC
  Filled 2017-10-30: qty 2

## 2017-10-30 SURGICAL SUPPLY — 11 items
CATH INFINITI 5 FR MPA2 (CATHETERS) ×3 IMPLANT
CATH INFINITI 5FR ANG PIGTAIL (CATHETERS) ×3 IMPLANT
CATH INFINITI 5FR JL4 (CATHETERS) ×3 IMPLANT
CATH INFINITI 5FR JL5 (CATHETERS) ×3 IMPLANT
CATH INFINITI JR4 5F (CATHETERS) ×3 IMPLANT
DEVICE CLOSURE MYNXGRIP 5F (Vascular Products) ×3 IMPLANT
KIT MANI 3VAL PERCEP (MISCELLANEOUS) ×3 IMPLANT
NEEDLE PERC 18GX7CM (NEEDLE) ×3 IMPLANT
PACK CARDIAC CATH (CUSTOM PROCEDURE TRAY) ×3 IMPLANT
SHEATH AVANTI 5FR X 11CM (SHEATH) ×3 IMPLANT
WIRE GUIDERIGHT .035X150 (WIRE) ×3 IMPLANT

## 2017-10-30 NOTE — Progress Notes (Signed)
Patient Name: Marcus Williams Date of Encounter: 10/30/2017  Hospital Problem List     Active Problems:   NSTEMI (non-ST elevated myocardial infarction) [facility])    Patient Profile     62 year old male with history of coronary disease status post PCI of the saphenous vein graft to the PDA who was admitted with non-ST elevation myocardial infarction.  Cardiac catheterization done today to evaluate coronary anatomy.  Access was unremarkable.  After the first puff of IV contrast patient began to have difficulty breathing with resultant hypoxia with pulse oximetry dropping to 73 on CPAP.  He also had rapid drop in his systolic blood pressure with the systolic pressures in the 60s.  He was placed on BiPAP and given fluid bolus along with placed on dopamine.  Blood pressure improved as did his pulse oximetry.  He currently is on BiPAP with pulse ox at greater than 93 with a systolic blood pressure in 110 on 10 mics of IV dopamine.  He is being transferred to the ICU for further treatment.  Patient's native coronaries were occluded from previous class last week.  Stent placed in the saphenous vein graft to the RCA was patent.  Still anatomy was slightly different in that it appears that he lost the distal runoff of his PDA.  This was a small area of distribution.  The saphenous vein graft to the OM, diagonal and LIMA were all patent.  Etiology event appears to have been contrast mediated however he to be concerned about retroperitoneal bleed.  Runoff studies showed no evidence of retroperitoneal bleeding.  She does have a rash on his lower abdomen.  Subjective   Short of breath  Inpatient Medications    . [MAR Hold] atorvastatin  80 mg Oral QHS  . [MAR Hold] clopidogrel  75 mg Oral Q breakfast  . [MAR Hold] diltiazem  180 mg Oral BID  . [MAR Hold] DULoxetine  20 mg Oral Daily  . [MAR Hold] furosemide  80 mg Intravenous BID  . [MAR Hold] insulin aspart  0-20 Units Subcutaneous TID WC  . [MAR Hold]  insulin aspart  0-5 Units Subcutaneous QHS  . [MAR Hold] insulin aspart protamine- aspart  60 Units Subcutaneous BID WC  . [MAR Hold] lisinopril  10 mg Oral Daily  . [MAR Hold] metoprolol tartrate  100 mg Oral BID  . [MAR Hold] mometasone-formoterol  2 puff Inhalation BID  . [MAR Hold] nitroGLYCERIN  2 inch Topical Q6H  . [MAR Hold] pantoprazole  40 mg Oral Daily  . [MAR Hold] potassium chloride SA  20 mEq Oral Daily  . sodium chloride flush  3 mL Intravenous Q12H  . sodium chloride flush  3 mL Intravenous Q12H  . [MAR Hold] torsemide  40 mg Oral Daily    Vital Signs    Vitals:   10/30/17 1033 10/30/17 1047 10/30/17 1224 10/30/17 1251  BP: 120/77 127/75    Pulse: 64 64    Resp: 18 (!) 22    Temp:  98.5 F (36.9 C)    TempSrc:  Oral    SpO2: 96% 95% 99% (!) 78%  Weight:      Height:        Intake/Output Summary (Last 24 hours) at 10/30/2017 1325 Last data filed at 10/30/2017 1130 Gross per 24 hour  Intake 140.23 ml  Output 2975 ml  Net -2834.77 ml   Filed Weights   10/28/17 2304 10/29/17 0353 10/30/17 0344  Weight: 129.6 kg (285 lb 11.2 oz)  128.5 kg (283 lb 3.2 oz) 127.7 kg (281 lb 9.6 oz)    Physical Exam    GEN: Well nourished, well developed, in no acute distress.  HEENT: normal.  Neck: Supple, no JVD, carotid bruits, or masses. Cardiac: RRR, no murmurs, rubs, or gallops. No clubbing, cyanosis, edema.  Radials/DP/PT 2+ and equal bilaterally.  Respiratory:  Respirations regular and unlabored, clear to auscultation bilaterally. GI: Soft, nontender, nondistended, BS + x 4. MS: no deformity or atrophy. Skin: warm and dry, no rash. Neuro:  Strength and sensation are intact. Psych: Normal affect.  Labs    CBC Recent Labs    10/28/17 1442 10/29/17 0826 10/30/17 0139  WBC 10.6 10.5 8.6  NEUTROABS 9.5*  --   --   HGB 11.9* 10.9* 11.4*  HCT 34.7* 31.9* 32.6*  MCV 87.9 88.5 87.7  PLT 220 213 270   Basic Metabolic Panel Recent Labs    10/28/17 1442  10/29/17 0245  NA 137 137  K 3.8 3.9  CL 105 105  CO2 20* 24  GLUCOSE 140* 121*  BUN 24* 22*  CREATININE 1.04 0.93  CALCIUM 9.3 8.9   Liver Function Tests Recent Labs    10/28/17 1442  AST 46*  ALT 39  ALKPHOS 66  BILITOT 0.7  PROT 7.5  ALBUMIN 3.7   No results for input(s): LIPASE, AMYLASE in the last 72 hours. Cardiac Enzymes Recent Labs    10/29/17 0826 10/29/17 1436 10/29/17 2102  TROPONINI 12.84* 11.83* 8.81*   BNP No results for input(s): BNP in the last 72 hours. D-Dimer No results for input(s): DDIMER in the last 72 hours. Hemoglobin A1C No results for input(s): HGBA1C in the last 72 hours. Fasting Lipid Panel Recent Labs    10/29/17 0245  CHOL 136  HDL 34*  LDLCALC 81  TRIG 104  CHOLHDL 4.0   Thyroid Function Tests No results for input(s): TSH, T4TOTAL, T3FREE, THYROIDAB in the last 72 hours.  Invalid input(s): FREET3  Telemetry    Sinus rhythm  ECG    Sinus rhythm with no change from baseline  Radiology    Dg Chest 2 View  Result Date: 10/22/2017 CLINICAL DATA:  62 year old male with chest pain. EXAM: CHEST - 2 VIEW COMPARISON:  Chest radiograph dated 08/10/2017 FINDINGS: There has been interval resolution of the previously seen left sided pneumonia. There is no focal consolidation, pleural effusion, or pneumothorax. The cardiac silhouette is within normal limits. No acute osseous pathology. Median sternotomy wires and CABG vascular clips. IMPRESSION: No active cardiopulmonary disease. Electronically Signed   By: Anner Crete M.D.   On: 10/22/2017 00:49   US Abdomen Complete  Result Date: 10/07/2017 CLINICAL DATA:  Diarrhea for 2 weeks. EXAM: ABDOMEN ULTRASOUND COMPLETE COMPARISON:  CT scan of March 24, 2017. FINDINGS: Gallbladder: No gallstones or wall thickening visualized. No sonographic Murphy sign noted by sonographer. Common bile duct: Diameter: 2.9 mm which is within normal limits. Liver: No focal lesion identified. Within  normal limits in parenchymal echogenicity. Portal vein is patent on color Doppler imaging with normal direction of blood flow towards the liver. IVC: No abnormality visualized. Pancreas: Visualized portion unremarkable. Spleen: Size and appearance within normal limits. Right Kidney: Length: 14 cm. Echogenicity within normal limits. No mass or hydronephrosis visualized. Left Kidney: Length: 14.2 cm. Echogenicity within normal limits. No mass or hydronephrosis visualized. Abdominal aorta: No aneurysm visualized. Other findings: None. IMPRESSION: No definite abnormality seen in the abdomen. Electronically Signed   By: Sabino Dick  Brooke Bonito, M.D.   On: 10/07/2017 14:35   Dg Chest Portable 1 View  Result Date: 10/28/2017 CLINICAL DATA:  Central and LEFT chest pain beginning this afternoon. History of myocardial infarction and recent cardiac stent placement. EXAM: PORTABLE CHEST 1 VIEW COMPARISON:  Chest radiograph July 24, 2017 FINDINGS: Cardiac silhouette is mildly enlarged and unchanged. Status post median sternotomy for CABG, chronically fractured proximal wires. Mild bronchitic changes without pleural effusion or focal consolidation. No pneumothorax. Soft tissue planes included osseous structures are nonsuspicious. IMPRESSION: Stable cardiomegaly.  Mild bronchitic changes. Electronically Signed   By: Elon Alas M.D.   On: 10/28/2017 15:20    Assessment & Plan    62 year old male with coronary artery disease and mild aortic stenosis who was admitted with a non-ST elevation myocardial infarction.  Cardiac catheterization did not appear to show any proximal vessel change.  He does have evidence of distal PDA occlusion.  This is a small distribution and likely the cause of his non-STEMI.  Not amenable to PCI.  Cath complicated by hypotension and hypoxia.  Concern over possible contrast mediated allergy with anaphylaxis.  Improved with dopamine and BiPAP.  Patient received Solu-Medrol, Benadryl and Pepcid.   We will transfer the ICU for further supportive care.  Signed, Javier Docker Tia Hieronymus MD 10/30/2017, 1:25 PM  Pager: [phone]

## 2017-10-30 NOTE — Progress Notes (Deleted)
RN went in to draw AM labs. Upon assessment, this RN noticed that patient had de-accesed himself. Intact needle found on floor. Port site clean and intact. Will continue to monitor.   Iran Sizer M

## 2017-10-30 NOTE — Consult Note (Addendum)
Reason for Consult: Respiratory distress and hypotension Referring Physician: Dr. Dayton Scrape is an 62 y.o. male.  HPI: [name] is a 62 year old gentleman with a past medical history remarkable for hypertension, diabetes, CVA, obstructive sleep apnea on CPAP, significant history for coronary artery disease, status post prior bypass, stent, history of systolic heart failure, aortic stenosis, was recently discharged for a non-STEMI, cardiac catheterization done on May 10 revealed diffuse coronary disease presented to the emergency room with acute intermittent chest pain EKG revealed tachycardia with diffuse ST segment depression laterally with poor R wave progression, chest x-ray revealed cardiomegaly, patient subsequent was seen by cardiologist, was heparinized and subsequently had cardiac catheterization performed today.  Per Dr. Ubaldo Glassing he did not even engage the coronaries, had injected some dye and benzodiazepine and patient became hypotensive and went into respiratory distress.  He was started on BiPAP, given fluids, started on dopamine with improvement.  Dr. Ubaldo Glassing was able to complete the case please see his procedure note.  Presumptive diagnosis is allergic reaction to medication.  Also pending stat echocardiogram to make sure no cardiac issue, will send troponin and hemoglobin if hemoglobin low will need CT abdomen to rule out retroperitoneal bleed.  Presently patient is awake, alert and communicating in the intensive care unit on BiPAP, is on 8 mcg of dopamine with systolic blood pressure 564.  Past Medical History:  Diagnosis Date  . A-fib ([place])   . Aortic stenosis, moderate 11/2015  . Arthritis   . CAD (coronary artery disease)   . Chronic systolic CHF (congestive heart failure) ([place])   . Diabetes mellitus without complication ([place])    INSULIN DEPENDENT  . GERD (gastroesophageal reflux disease)   . History of cardioversion   . Hypertension   . MI (myocardial infarction) ([place])    . OSA on CPAP   . Sepsis ([place])   . Shortness of breath dyspnea   . Stroke ([place])   . SVT (supraventricular tachycardia) ([facility])     Past Surgical History:  Procedure Laterality Date  . 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 BUNIONECTOMY    . CARDIAC CATHETERIZATION N/A 10/02/2015   Procedure: Coronary/Grafts Angiography;  Surgeon: Teodoro Spray, MD;  Location: [place] [facility];  Service: Cardiovascular;  Laterality: N/A;  . CARDIAC CATHETERIZATION N/A 11/21/2015   Procedure: Right and Left Heart Cath;  Surgeon: Sherren Mocha, MD;  Location: [place] [facility];  Service: Cardiovascular;  Laterality: N/A;  . CORONARY ARTERY BYPASS GRAFT    . CORONARY STENT INTERVENTION N/A 07/30/2017   Procedure: CORONARY STENT INTERVENTION;  Surgeon: Isaias Cowman, MD;  Location: [place] [facility];  Service: Cardiovascular;  Laterality: N/A;  . CORONARY STENT INTERVENTION N/A 10/23/2017   Procedure: CORONARY STENT INTERVENTION;  Surgeon: Yolonda Kida, MD;  Location: [place] [facility];  Service: Cardiovascular;  Laterality: N/A;  . KNEE SURGERY    . LEFT HEART CATH AND CORS/GRAFTS ANGIOGRAPHY N/A 10/22/2017   Procedure: LEFT HEART CATH AND CORS/GRAFTS ANGIOGRAPHY;  Surgeon: Corey Skains, MD;  Location: [place] [facility];  Service: Cardiovascular;  Laterality: N/A;  . quadruple bypass    . RIGHT/LEFT HEART CATH AND CORONARY/GRAFT ANGIOGRAPHY N/A 07/30/2017   Procedure: RIGHT/LEFT HEART CATH AND CORONARY/GRAFT ANGIOGRAPHY;  Surgeon: Isaias Cowman, MD;  Location: [place] [facility];  Service: Cardiovascular;  Laterality: N/A;  . TEE  WITHOUT CARDIOVERSION  11/21/2015  . TEE WITHOUT CARDIOVERSION N/A 11/21/2015   Procedure: TRANSESOPHAGEAL ECHOCARDIOGRAM (TEE);  Surgeon: Larey Dresser, MD;  Location: [facility] ENDOSCOPY;  Service: Cardiovascular;  Laterality: N/A;     Family History  Problem Relation Age of Onset  . Stroke Mother   . Heart attack Mother   . Hypertension Mother   . Heart attack Father   . Hypertension Father   . Heart attack Brother        #1  . Diabetes Brother        #1  . Heart disease Brother        #2  . Lung disease Brother        #2  . Hypertension Brother        #2  . Diabetes Brother        #2    Social History:  reports that he has never smoked. He has never used smokeless tobacco. He reports that he does not drink alcohol or use drugs.  Allergies:  Allergies  Allergen Reactions  . Contrast Media [Iodinated Diagnostic Agents] Shortness Of Breath    Hypoxia/hypotension  . Versed [Midazolam] Other (See Comments)    Hypotension/hypoxia    Medications: I have reviewed the patient's current medications.  Results for orders placed or performed during the hospital encounter of 10/28/17 (from the past 48 hour(s))  Heparin level (unfractionated)     Status: Abnormal   Collection Time: 10/28/17  3:19 PM  Result Value Ref Range   Heparin Unfractionated 0.82 (H) 0.30 - 0.70 IU/mL    Comment: (NOTE) If heparin results are below expected values, and patient dosage has  been confirmed, suggest follow up testing of antithrombin III levels. Performed at [facility], [address] [place], [place] [address]   Troponin I-serum (0, 3, 6 hours)     Status: Abnormal   Collection Time: 10/28/17  8:11 PM  Result Value Ref Range   Troponin I 2.69 (HH) <0.03 ng/mL    Comment: CRITICAL VALUE NOTED. VALUE IS CONSISTENT WITH PREVIOUSLY REPORTED/CALLED VALUE AKT Performed at [facility], [address] [place], [place] [address]   Glucose, capillary     Status: Abnormal   Collection Time: 10/28/17 11:12 PM  Result Value Ref Range   Glucose-Capillary 134 (H) 65 - 99 mg/dL  APTT     Status: Abnormal   Collection Time: 10/28/17 11:14 PM  Result Value Ref Range   aPTT 41 (H) 24 - 36 seconds     Comment:        IF BASELINE aPTT IS ELEVATED, SUGGEST PATIENT RISK ASSESSMENT BE USED TO DETERMINE APPROPRIATE ANTICOAGULANT THERAPY. Performed at [facility], [address] [place], [place] [address]   Troponin I-serum (0, 3, 6 hours)     Status: Abnormal   Collection Time: 10/28/17 11:14 PM  Result Value Ref Range   Troponin I 5.91 (HH) <0.03 ng/mL    Comment: CRITICAL VALUE NOTED. VALUE IS CONSISTENT WITH PREVIOUSLY REPORTED/CALLED VALUE. JAG Performed at [facility], [address] [place], [place] [address]   Lipid panel     Status: Abnormal   Collection Time: 10/29/17  2:45 AM  Result Value Ref Range   Cholesterol 136 0 - 200 mg/dL   Triglycerides 104 <150 mg/dL   HDL 34 (L) >40 mg/dL   Total CHOL/HDL Ratio 4.0 RATIO   VLDL 21 0 - 40 mg/dL   LDL Cholesterol 81 0 - 99 mg/dL  Comment:        Total Cholesterol/HDL:CHD Risk Coronary Heart Disease Risk Table                     Men   Women  1/2 Average Risk   3.4   3.3  Average Risk       5.0   4.4  2 X Average Risk   9.6   7.1  3 X Average Risk  23.4   11.0        Use the calculated Patient Ratio above and the CHD Risk Table to determine the patient's CHD Risk.        ATP III CLASSIFICATION (LDL):  <100     mg/dL   Optimal  100-129  mg/dL   Near or Above                    Optimal  130-159  mg/dL   Borderline  160-189  mg/dL   High  >190     mg/dL   Very High Performed at [facility], [address] [place], [place] [address]   Basic metabolic panel     Status: Abnormal   Collection Time: 10/29/17  2:45 AM  Result Value Ref Range   Sodium 137 135 - 145 mmol/L   Potassium 3.9 3.5 - 5.1 mmol/L   Chloride 105 101 - 111 mmol/L   CO2 24 22 - 32 mmol/L   Glucose, Bld 121 (H) 65 - 99 mg/dL   BUN 22 (H) 6 - 20 mg/dL   Creatinine, Ser 0.93 0.61 - 1.24 mg/dL   Calcium 8.9 8.9 - 10.3 mg/dL   GFR calc non Af Amer >60 >60 mL/min   GFR calc Af Amer >60 >60 mL/min    Comment:  (NOTE) The eGFR has been calculated using the CKD EPI equation. This calculation has not been validated in all clinical situations. eGFR's persistently <60 mL/min signify possible Chronic Kidney Disease.    Anion gap 8 5 - 15    Comment: Performed at [facility], [place], [place] [address]  Troponin I-serum (0, 3, 6 hours)     Status: Abnormal   Collection Time: 10/29/17  2:45 AM  Result Value Ref Range   Troponin I 10.02 (HH) <0.03 ng/mL    Comment: CRITICAL VALUE NOTED. VALUE IS CONSISTENT WITH PREVIOUSLY REPORTED/CALLED VALUE.JAG Performed at [facility], [address] [place], [place] [address]   Glucose, capillary     Status: Abnormal   Collection Time: 10/29/17  7:48 AM  Result Value Ref Range   Glucose-Capillary 102 (H) 65 - 99 mg/dL  APTT     Status: Abnormal   Collection Time: 10/29/17  8:26 AM  Result Value Ref Range   aPTT 47 (H) 24 - 36 seconds    Comment:        IF BASELINE aPTT IS ELEVATED, SUGGEST PATIENT RISK ASSESSMENT BE USED TO DETERMINE APPROPRIATE ANTICOAGULANT THERAPY. Performed at [facility], [place], [place] [address]   Heparin level (unfractionated)     Status: None   Collection Time: 10/29/17  8:26 AM  Result Value Ref Range   Heparin Unfractionated 0.50 0.30 - 0.70 IU/mL    Comment: (NOTE) If heparin results are below expected values, and patient dosage has  been confirmed, suggest follow up testing of antithrombin III levels. Performed at [facility], [address] [place],  [address]  CBC     Status: Abnormal   Collection Time: 10/29/17  8:26 AM  Result Value Ref Range   WBC 10.5 3.8 - 10.6 K/uL   RBC 3.60 (L) 4.40 - 5.90 MIL/uL   Hemoglobin 10.9 (L) 13.0 - 18.0 g/dL   HCT 31.9 (L) 40.0 - 52.0 %   MCV 88.5 80.0 - 100.0 fL   MCH 30.3 26.0 - 34.0 pg   MCHC 34.2 32.0 - 36.0 g/dL   RDW 15.1 (H) 11.5 - 14.5 %   Platelets 213 150 - 440 K/uL    Comment:  Performed at [facility], [address] [place], [place] [address]  Troponin I (q 6hr x 3)     Status: Abnormal   Collection Time: 10/29/17  8:26 AM  Result Value Ref Range   Troponin I 12.84 (HH) <0.03 ng/mL    Comment: CRITICAL VALUE NOTED. VALUE IS CONSISTENT WITH PREVIOUSLY REPORTED/CALLED VALUE...[facility] Performed at [facility], [address] [place], [place] [address]   Glucose, capillary     Status: None   Collection Time: 10/29/17 11:51 AM  Result Value Ref Range   Glucose-Capillary 85 65 - 99 mg/dL  Troponin I (q 6hr x 3)     Status: Abnormal   Collection Time: 10/29/17  2:36 PM  Result Value Ref Range   Troponin I 11.83 (HH) <0.03 ng/mL    Comment: CRITICAL VALUE NOTED. VALUE IS CONSISTENT WITH PREVIOUSLY REPORTED/CALLED VALUE KLW Performed at [facility], [address] [place], [place] [address]   Glucose, capillary     Status: Abnormal   Collection Time: 10/29/17  4:25 PM  Result Value Ref Range   Glucose-Capillary 130 (H) 65 - 99 mg/dL  APTT     Status: Abnormal   Collection Time: 10/29/17  7:04 PM  Result Value Ref Range   aPTT 56 (H) 24 - 36 seconds    Comment:        IF BASELINE aPTT IS ELEVATED, SUGGEST PATIENT RISK ASSESSMENT BE USED TO DETERMINE APPROPRIATE ANTICOAGULANT THERAPY. Performed at [facility], [address] [place], [place] [address]   Glucose, capillary     Status: Abnormal   Collection Time: 10/29/17  8:56 PM  Result Value Ref Range   Glucose-Capillary 112 (H) 65 - 99 mg/dL  Troponin I     Status: Abnormal   Collection Time: 10/29/17  9:02 PM  Result Value Ref Range   Troponin I 8.81 (HH) <0.03 ng/mL    Comment: CRITICAL VALUE NOTED. VALUE IS CONSISTENT WITH PREVIOUSLY REPORTED/CALLED VALUE. JAG Performed at [facility], [address] [place], [place] [address]   CBC     Status: Abnormal   Collection Time: 10/30/17  1:39 AM  Result Value Ref Range   WBC 8.6 3.8 - 10.6 K/uL    RBC 3.71 (L) 4.40 - 5.90 MIL/uL   Hemoglobin 11.4 (L) 13.0 - 18.0 g/dL   HCT 32.6 (L) 40.0 - 52.0 %   MCV 87.7 80.0 - 100.0 fL   MCH 30.7 26.0 - 34.0 pg   MCHC 35.0 32.0 - 36.0 g/dL   RDW 15.5 (H) 11.5 - 14.5 %   Platelets 226 150 - 440 K/uL    Comment: Performed at [facility], [address] [place], [place] [address]  APTT     Status: Abnormal   Collection Time: 10/30/17  1:39 AM  Result Value Ref Range   aPTT 70 (H) 24 - 36 seconds  Comment:        IF BASELINE aPTT IS ELEVATED, SUGGEST PATIENT RISK ASSESSMENT BE USED TO DETERMINE APPROPRIATE ANTICOAGULANT THERAPY. Performed at [facility], [place], [place] [address]   Heparin level (unfractionated)     Status: None   Collection Time: 10/30/17  1:39 AM  Result Value Ref Range   Heparin Unfractionated 0.52 0.30 - 0.70 IU/mL    Comment: (NOTE) If heparin results are below expected values, and patient dosage has  been confirmed, suggest follow up testing of antithrombin III levels. Performed at [facility], [address] [place], [place] [address]   Glucose, capillary     Status: None   Collection Time: 10/30/17  7:51 AM  Result Value Ref Range   Glucose-Capillary 97 65 - 99 mg/dL  Glucose, capillary     Status: Abnormal   Collection Time: 10/30/17 10:56 AM  Result Value Ref Range   Glucose-Capillary 113 (H) 65 - 99 mg/dL  Glucose, capillary     Status: Abnormal   Collection Time: 10/30/17  1:40 PM  Result Value Ref Range   Glucose-Capillary 172 (H) 65 - 99 mg/dL  Glucose, capillary     Status: Abnormal   Collection Time: 10/30/17  2:33 PM  Result Value Ref Range   Glucose-Capillary 131 (H) 65 - 99 mg/dL    Dg Chest Portable 1 View  Result Date: 10/28/2017 CLINICAL DATA:  Central and LEFT chest pain beginning this afternoon. History of myocardial infarction and recent cardiac stent placement. EXAM: PORTABLE CHEST 1 VIEW COMPARISON:  Chest radiograph July 24, 2017 FINDINGS: Cardiac silhouette is mildly enlarged and unchanged. Status post median sternotomy for CABG, chronically fractured proximal wires. Mild bronchitic changes without pleural effusion or focal consolidation. No pneumothorax. Soft tissue planes included osseous structures are nonsuspicious. IMPRESSION: Stable cardiomegaly.  Mild bronchitic changes. Electronically Signed   By: Elon Alas M.D.   On: 10/28/2017 15:20    ROS Blood pressure 113/63, pulse 73, temperature 98.1 F (36.7 C), temperature source Axillary, resp. rate 20, height '5\' 7"'$  (1.702 m), weight 281 lb 9.6 oz (127.7 kg), SpO2 99 %. Physical Exam Patient is awake, alert, oriented, he is communicating, presently on dopamine and BiPAP but appears comfortable HEENT: Limited exam patient is on BiPAP, body habitus makes jugular venous distention difficult to assess, trachea is midline, is breathing comfortably at this point Cardiovascular: Controlled ventricular response on monitor, regular at this time on auscultation.  Pulmonary: Clear to auscultation Abdominal: Positive bowel sounds, no clear rash, some ecchymosis at prior injection sites but no large flank hematoma Extremities: No clubbing, cyanosis, pulses palpable Neurologic: No focal deficits appreciated  Assessment/Plan:  Unclear precipitating event of hypotension and respiratory failure.  Will obtain chest x-ray, EKG, cardiac enzymes, CBC, BMP, echocardiogram.  We will support hemodynamics with dopamine along with BiPAP for his respiratory distress.  Based on results of the studies will investigate further.  Empirically patient has been started on steroids, H2 blockers for possible allergic reaction Critical care time 35 minutes   Jeany [place]ville 10/30/2017, 2:51 PM

## 2017-10-30 NOTE — Progress Notes (Signed)
ANTICOAGULATION CONSULT NOTE - Initial Consult  Pharmacy Consult for heparin Indication: chest pain/ACS  No Known Allergies  Patient Measurements: Height: 5\' 7"  (170.2 cm) Weight: 283 lb 3.2 oz (128.5 kg) IBW/kg (Calculated) : 66.1 Heparin Dosing Weight: 98 kg  Vital Signs: Temp: 98.1 F (36.7 C) (05/16 2007) Temp Source: Oral (05/16 2007) BP: 114/53 (05/16 2325) Pulse Rate: 64 (05/16 2325)  Labs: Recent Labs    10/28/17 1442 10/28/17 1519  10/29/17 0245 10/29/17 0826 10/29/17 1436 10/29/17 1904 10/29/17 2102 10/30/17 0139  HGB 11.9*  --   --   --  10.9*  --   --   --  11.4*  HCT 34.7*  --   --   --  31.9*  --   --   --  32.6*  PLT 220  --   --   --  213  --   --   --  226  APTT 34  --    < >  --  47*  --  56*  --  70*  LABPROT 13.4  --   --   --   --   --   --   --   --   INR 1.03  --   --   --   --   --   --   --   --   HEPARINUNFRC  --  0.82*  --   --  0.50  --   --   --  0.52  CREATININE 1.04  --   --  0.93  --   --   --   --   --   TROPONINI 0.51*  --    < > 10.02* 12.84* 11.83*  --  8.81*  --    < > = values in this interval not displayed.    Estimated Creatinine Clearance: 106.1 mL/min (by C-G formula based on SCr of 0.93 mg/dL).   Medical History: Past Medical History:  Diagnosis Date  . A-fib ([place])   . Aortic stenosis, moderate 11/2015  . Arthritis   . CAD (coronary artery disease)   . Chronic systolic CHF (congestive heart failure) ([place])   . Diabetes mellitus without complication ([place])    INSULIN DEPENDENT  . GERD (gastroesophageal reflux disease)   . History of cardioversion   . Hypertension   . MI (myocardial infarction) ([place])   . OSA on CPAP   . Sepsis ([place])   . Shortness of breath dyspnea   . Stroke ([place])   . SVT (supraventricular tachycardia) ([facility])     Medications:  Infusions:  . sodium chloride 20 mL/hr at 10/28/17 1452  . sodium chloride    . heparin 1,600 Units/hr (10/29/17 2053)    Assessment: 43 yom with CP. Took  nitroglycerin x 3 with no relief. Had MI with stent stent placed 5 days PTA. EKG with sinus tachycardia with Premature atrial complexes; Marked ST abnormality, possible inferior subendocardial injury; Marked ST abnormality, possible anterolateral subendocardial injury. No troponin available at this time. Pharmacy consulted to dose heparin for ACS. PTA list includes Eliquis but needs to be verified. Will add-on Heparin level to baseline labs and dose based on aPTT if elevated.  Goal of Therapy:  Heparin level 0.3-0.7 units/ml aPTT 68 to 109 seconds Monitor platelets by anticoagulation protocol: Yes   Plan:  Will increase heparin infusion to 1600 units/hr. Next HL/aPTT in 6 hours.   05/17 0130 heparin level 0.52, aPTT 70. Now correlating in therapeutic range.  Will monitor by heparin level only. Continue current regimen. Recheck heparin level and CBC with tomorrow AM labs.   Sim Boast, PharmD, BCPS  10/30/17 3:46 AM

## 2017-10-30 NOTE — Progress Notes (Signed)
[facility] [place] NAME: Marcus Williams    MR#:  [id number]  DATE OF BIRTH:  1956/05/01  SUBJECTIVE:  patient seen in specials recovery. He had shortness of breath and became hypotensive during cardiac cath. Now on BiPAP. He is being transferred to the ICU. Wife in the room. REVIEW OF SYSTEMS:   Review of Systems  Constitutional: Negative for chills, fever and weight loss.  HENT: Negative for ear discharge, ear pain and nosebleeds.   Eyes: Negative for blurred vision, pain and discharge.  Respiratory: Positive for cough and shortness of breath. Negative for sputum production, wheezing and stridor.   Cardiovascular: Negative for chest pain, palpitations, orthopnea and PND.  Gastrointestinal: Negative for abdominal pain, diarrhea, nausea and vomiting.  Genitourinary: Negative for frequency and urgency.  Musculoskeletal: Negative for back pain and joint pain.  Neurological: Negative for sensory change, speech change, focal weakness and weakness.  Psychiatric/Behavioral: Negative for depression and hallucinations. The patient is not nervous/anxious.    Tolerating Diet:yes Tolerating PT: ambulatory  DRUG ALLERGIES:   Allergies  Allergen Reactions  . Contrast Media [Iodinated Diagnostic Agents] Shortness Of Breath    Hypoxia/hypotension  . Versed [Midazolam] Other (See Comments)    Hypotension/hypoxia    VITALS:  Blood pressure 113/63, pulse 73, temperature 98.1 F (36.7 C), temperature source Axillary, resp. rate 20, height 5\' 7"  (1.702 m), weight 127.7 kg (281 lb 9.6 oz), SpO2 99 %.  PHYSICAL EXAMINATION:   Physical Exam  GENERAL:  62 y.o.-year-old patient lying in the bed with no acute distress. Morbidly obese EYES: Pupils equal, round, reactive to light and accommodation. No scleral icterus. Extraocular muscles intact.  HEENT: Head atraumatic, normocephalic. Oropharynx and nasopharynx clear. BIPAP NECK:  Supple, no jugular  venous distention. No thyroid enlargement, no tenderness.  LUNGS: Normal breath sounds bilaterally, no wheezing, rales, rhonchi. No use of accessory muscles of respiration.  CARDIOVASCULAR: S1, S2 normal. No murmurs, rubs, or gallops.  ABDOMEN: Soft, nontender, nondistended. Bowel sounds present. No organomegaly or mass. Abdominal obesity EXTREMITIES: No cyanosis, clubbing or edema b/l.    NEUROLOGIC: Cranial nerves II through XII are intact. No focal Motor or sensory deficits b/l.   PSYCHIATRIC:  patient is alert and oriented x 3.  SKIN: No obvious rash, lesion, or ulcer.   LABORATORY PANEL:  CBC Recent Labs  Lab 10/30/17 0139  WBC 8.6  HGB 11.4*  HCT 32.6*  PLT 226    Chemistries  Recent Labs  Lab 10/28/17 1442 10/29/17 0245  NA 137 137  K 3.8 3.9  CL 105 105  CO2 20* 24  GLUCOSE 140* 121*  BUN 24* 22*  CREATININE 1.04 0.93  CALCIUM 9.3 8.9  AST 46*  --   ALT 39  --   ALKPHOS 66  --   BILITOT 0.7  --    Cardiac Enzymes Recent Labs  Lab 10/29/17 2102  TROPONINI 8.81*   RADIOLOGY:  No results found. ASSESSMENT AND PLAN:   Marcus Williams  is a 62 y.o. male with a known history per below which also includes recent non-STEMI-discharged home 5 days ago status post stent placement, heart catheterization done May 10 noted for diffuse coronary artery disease, presenting with acute intermittent chest pain since yesterday, patient states that it is sharp, lasted few seconds, nonradiating, dyspnea on exertion, associated with shortness of breath, no better with nitroglycerin taken 3 times earlier today  *Acute hypoxic respiratory failure and hypertension during cardiac cath procedure. -  Presumed I contrast allergy although patient has had contrast in the past. -Received IV Solu-Medrol -currently on BiPAP. Patient will be transferred to ICU -IV dopamine drip.  *Acute recurrent chest pain with coronary artery disease status post CABG and coronary artery stenting -Most likely  secondary to acute non-STEMI, hospitalization last week for non-STEMI status post heart catheterization noted for diffuse coronary artery disease, status post stenting at that time -cont asa, statin therapy, Plavix - Eliquis currently on hold given recent spinal injection on yesterday -cont lisinopril, Lopressor, -IV heparin drip will be continued,  Wean nitroglycerin drip, -appreciate cardiology input by Dr. Ubaldo Glassing -cardiac cath problem reported not show any acute blockage.   \*Acute on chronic systolic/diastolic congestive heart failure exacerbation Congestive heart failure protocol, IV Lasix, lisinopril, Lopressor, Plavix, statin therapy, strict I&O monitoring, daily weights  *History of paroxysmal A. Fib Currently in sinus rhythm Eliquis currently on hold for cardiac cath plan on Friday  continue Cardizem, metoprolol  *Chronic diabetes mellitus type 2 Stable Hold metformin, continue NovoLog at reduced dose of 60 units twice daily, sliding scale insulin with Accu-Cheks per routine  *Chronic obstructive sleep apnea May use home device at bedtime/as needed  *COPD without exacerbation Breathing treatments as needed  Case discussed with Care Management/Social Worker. Management plans discussed with the patient, family and they are in agreement.  CODE STATUS: full  DVT Prophylaxis: heparin  TOTAL TIME TAKING CARE OF THIS PATIENT: *30* minutes.  >50% time spent on counselling and coordination of care  POSSIBLE D/C IN *2 to 3* DAYS, DEPENDING ON CLINICAL CONDITION.  Note: This dictation was prepared with Dragon dictation along with smaller phrase technology. Any transcriptional errors that result from this process are unintentional.  Fritzi Mandes M.D on 10/30/2017 at 3:19 PM  Between 7am to 6pm - Pager - [phone]  After 6pm go to [email or web address] - password [email or web address] [facility]  Office  [phone]  CC: Primary care physician; Idelle Crouch,  MDPatient ID: Marcus Williams, male   DOB: [birth year], 62 y.o.   MRN: [id number]

## 2017-10-30 NOTE — Progress Notes (Signed)
Patient developed hypoxia and hypotension at the start of the case prior to coronary artery engagement.  Began after first puff of IV contrast.  No evidence of retroperitoneal bleed with hand injections.  Concerning for possible contrast mediated anaphylaxis.  Improving with dopamine and BiPAP.  Patient received high-dose steroids Benadryl and Pepcid.

## 2017-10-31 LAB — BASIC METABOLIC PANEL
ANION GAP: 10 (ref 5–15)
BUN: 33 mg/dL — ABNORMAL HIGH (ref 6–20)
CHLORIDE: 102 mmol/L (ref 101–111)
CO2: 24 mmol/L (ref 22–32)
Calcium: 8 mg/dL — ABNORMAL LOW (ref 8.9–10.3)
Creatinine, Ser: 1.17 mg/dL (ref 0.61–1.24)
GFR calc Af Amer: >60 mL/min (ref >60)
GFR calc non Af Amer: >60 mL/min (ref >60)
GLUCOSE: 282 mg/dL — AB (ref 65–99)
POTASSIUM: 4.5 mmol/L (ref 3.5–5.1)
Sodium: 136 mmol/L (ref 135–145)

## 2017-10-31 LAB — CBC
HCT: 33.6 % — ABNORMAL LOW (ref 40.0–52.0)
HEMOGLOBIN: 11.5 g/dL — AB (ref 13.0–18.0)
MCH: 30.4 pg (ref 26.0–34.0)
MCHC: 34.3 g/dL (ref 32.0–36.0)
MCV: 88.5 fL (ref 80.0–100.0)
Platelets: 201 10*3/uL (ref 150–440)
RBC: 3.79 MIL/uL — AB (ref 4.40–5.90)
RDW: 15 % — ABNORMAL HIGH (ref 11.5–14.5)
WBC: 10.5 10*3/uL (ref 3.8–10.6)

## 2017-10-31 LAB — GLUCOSE, CAPILLARY
GLUCOSE-CAPILLARY: 321 mg/dL — AB (ref 65–99)
GLUCOSE-CAPILLARY: 329 mg/dL — AB (ref 65–99)
Glucose-Capillary: 324 mg/dL — ABNORMAL HIGH (ref 65–99)
Glucose-Capillary: 388 mg/dL — ABNORMAL HIGH (ref 65–99)
Glucose-Capillary: 432 mg/dL — ABNORMAL HIGH (ref 65–99)

## 2017-10-31 MED ORDER — INSULIN ASPART 100 UNIT/ML ~~LOC~~ SOLN
20.0000 [IU] | Freq: Once | SUBCUTANEOUS | Status: AC
  Administered 2017-10-31: 20 [IU] via INTRAVENOUS
  Filled 2017-10-31: qty 1

## 2017-10-31 MED ORDER — INSULIN ASPART 100 UNIT/ML ~~LOC~~ SOLN
SUBCUTANEOUS | Status: AC
  Filled 2017-10-31: qty 1

## 2017-10-31 MED ORDER — FAMOTIDINE 20 MG PO TABS
20.0000 mg | ORAL_TABLET | Freq: Two times a day (BID) | ORAL | Status: DC
  Administered 2017-10-31 – 2017-11-01 (×2): 20 mg via ORAL
  Filled 2017-10-31 (×2): qty 1

## 2017-10-31 NOTE — Progress Notes (Signed)
Follow up - Critical Care Medicine Note  Patient Details:    Marcus Williams is an 62 y.o. male.with a past medical history remarkable for hypertension, diabetes, CVA, obstructive sleep apnea on CPAP, significant history for coronary artery disease, status post prior bypass, stent, history of systolic heart failure, aortic stenosis, was recently discharged for a non-STEMI, status postcardiac catheterization done on May 18 complicated with hypotension and respiratory distress.  Lines, Airways, Drains:    Anti-infectives:  Anti-infectives (From admission, onward)   None      Microbiology: Results for orders placed or performed during the hospital encounter of 10/28/17  MRSA PCR Screening     Status: None   Collection Time: 10/30/17  2:39 PM  Result Value Ref Range Status   MRSA by PCR NEGATIVE NEGATIVE Final    Comment:        The GeneXpert MRSA Assay (FDA approved for NASAL specimens only), is one component of a comprehensive MRSA colonization surveillance program. It is not intended to diagnose MRSA infection nor to guide or monitor treatment for MRSA infections. Performed at [facility], [address] [place], [place] [address]      Studies: Dg Chest 2 View  Result Date: 10/22/2017 CLINICAL DATA:  62 year old male with chest pain. EXAM: CHEST - 2 VIEW COMPARISON:  Chest radiograph dated 08/10/2017 FINDINGS: There has been interval resolution of the previously seen left sided pneumonia. There is no focal consolidation, pleural effusion, or pneumothorax. The cardiac silhouette is within normal limits. No acute osseous pathology. Median sternotomy wires and CABG vascular clips. IMPRESSION: No active cardiopulmonary disease. Electronically Signed   By: Anner Crete M.D.   On: 10/22/2017 00:49   US Abdomen Complete  Result Date: 10/07/2017 CLINICAL DATA:  Diarrhea for 2 weeks. EXAM: ABDOMEN ULTRASOUND COMPLETE COMPARISON:  CT scan of March 24, 2017. FINDINGS:  Gallbladder: No gallstones or wall thickening visualized. No sonographic Murphy sign noted by sonographer. Common bile duct: Diameter: 2.9 mm which is within normal limits. Liver: No focal lesion identified. Within normal limits in parenchymal echogenicity. Portal vein is patent on color Doppler imaging with normal direction of blood flow towards the liver. IVC: No abnormality visualized. Pancreas: Visualized portion unremarkable. Spleen: Size and appearance within normal limits. Right Kidney: Length: 14 cm. Echogenicity within normal limits. No mass or hydronephrosis visualized. Left Kidney: Length: 14.2 cm. Echogenicity within normal limits. No mass or hydronephrosis visualized. Abdominal aorta: No aneurysm visualized. Other findings: None. IMPRESSION: No definite abnormality seen in the abdomen. Electronically Signed   By: Marijo Conception, M.D.   On: 10/07/2017 14:35   Dg Chest Port 1 View  Result Date: 10/30/2017 CLINICAL DATA:  62 year old male with shortness of breath. Recent NSTEMI. EXAM: PORTABLE CHEST 1 VIEW COMPARISON:  10/28/2017 and earlier. FINDINGS: Portable AP upright view at 1517 hours. Lower lung volumes. Stable cardiomegaly and mediastinal contours. Prior CABG. No pneumothorax, pulmonary edema or definite pleural effusion. Chronic perihilar scarring. Mildly increased retrocardiac opacity along the left hemidiaphragm most resembles atelectasis. Paucity of bowel gas in the upper abdomen. IMPRESSION: Lower lung volumes with mild atelectasis. No other acute cardiopulmonary abnormality. Electronically Signed   By: Genevie Ann M.D.   On: 10/30/2017 15:33   Dg Chest Portable 1 View  Result Date: 10/28/2017 CLINICAL DATA:  Central and LEFT chest pain beginning this afternoon. History of myocardial infarction and recent cardiac stent placement. EXAM: PORTABLE CHEST 1 VIEW COMPARISON:  Chest radiograph July 24, 2017 FINDINGS: Cardiac silhouette is  mildly enlarged and unchanged. Status post median  sternotomy for CABG, chronically fractured proximal wires. Mild bronchitic changes without pleural effusion or focal consolidation. No pneumothorax. Soft tissue planes included osseous structures are nonsuspicious. IMPRESSION: Stable cardiomegaly.  Mild bronchitic changes. Electronically Signed   By: Elon Alas M.D.   On: 10/28/2017 15:20    Consults: Treatment Team:  Teodoro Spray, MD   Subjective:    Overnight Issues: he has done extremely well overnight. His blood pressure normalized, dopamine has been weaned off, he is presently on room air sitting up out of bed eating breakfast with normal hemodynamics without any complaints.  Objective:  Vital signs for last 24 hours: Temp:  [97.6 F (36.4 C)-98.5 F (36.9 C)] 98.1 F (36.7 C) (05/18 0731) Pulse Rate:  [58-78] 70 (05/18 0700) Resp:  [0-32] 18 (05/18 0700) BP: (94-130)/(48-84) 97/84 (05/18 0700) SpO2:  [78 %-100 %] 97 % (05/18 0700) Weight:  [279 lb 12.2 oz (126.9 kg)] 279 lb 12.2 oz (126.9 kg) (05/18 0500)  Hemodynamic parameters for last 24 hours:    Intake/Output from previous day: 05/17 0701 - 05/18 0700 In: 2410.3 [P.O.:240; I.V.:2020.3; IV Piggyback:150] Out: 3150 [Urine:3150]  Intake/Output this shift: Total I/O In: -  Out: 700 [Urine:700]  Vent settings for last 24 hours:    Physical Exam:  Patient is awake, alert in no acute distress Vital signs: Please see the above listed vital signs HEENT: No oral lesions appreciated, trachea is midline, no excess or muscle utilization, on room air Cardiovascular: Regular rate and rhythm Pulmonary: Clear to auscultation Abdominal: Positive bowel sounds, soft exam Extremities: No clubbing cyanosis or edema noted Neurologic: No focal deficits appreciated  Assessment/Plan:   Unclear precipitating event from yesterday. Patient was empirically treated for acute allergic reaction with Solu-Medrol, Pepcid, Benadryl and pressors. His EKG and cardiac enzymes were  unchanged, his CBC did not show any decrease in hemoglobin making retroperitoneal bleed much less likely. Patient has completely recovered this morning is doing well with stable hemodynamics on room air will transfer to cards tele.   Hyperglycemia on sliding scale most likely worsened by steroids will stop at this point  Mild anemia no evidence of active bleeding  Complete a cardiac history please see Dr. Leamon Arnt note  History of OSA slept with CPAP last night  Paroxysmal atrial fibrillation, currently in sinus mechanism  History of COPD, no evidence of bronchospasm   Doniel Maiello 10/31/2017  *Care during the described time interval was provided by me and/or other providers on the critical care team.  I have reviewed this patient's available data, including medical history, events of note, physical examination and test results as part of my evaluation.

## 2017-10-31 NOTE — Progress Notes (Signed)
Patient refusing bed alarm, educated. Patient stated he will call if he needs to go into the bathroom, he will only stand to use urinal. Will continue to monitor patient.

## 2017-10-31 NOTE — Progress Notes (Signed)
Talked to Dr. Jannifer Franklin about patient's Bradycardia, HR 56, patient has been bradycardic throughout the shift. Patient has scheduled Metoprolol 100 mg and Cardizem 180 mg, order to hold Metoprolol. No other concern at the moment. RN will continue to monitor.

## 2017-10-31 NOTE — Progress Notes (Signed)
Patient has been pain free. Held nitro patch since HR has been in the 50s on and off this shift. Wore cpap overnight Good urine output on IV Lasix. No other concerns at this time.

## 2017-10-31 NOTE — Progress Notes (Signed)
Marcus Williams    MR#:  [id number]  DATE OF BIRTH:  08-09-1955  SUBJECTIVE:  patient feels a whole lot better. He is off BiPAP on room air. Denies any chest pain or shortness of breath. ate good breakfast.  REVIEW OF SYSTEMS:   Review of Systems  Constitutional: Negative for chills, fever and weight loss.  HENT: Negative for ear discharge, ear pain and nosebleeds.   Eyes: Negative for blurred vision, pain and discharge.  Respiratory: Positive for cough and shortness of breath. Negative for sputum production, wheezing and stridor.   Cardiovascular: Negative for chest pain, palpitations, orthopnea and PND.  Gastrointestinal: Negative for abdominal pain, diarrhea, nausea and vomiting.  Genitourinary: Negative for frequency and urgency.  Musculoskeletal: Negative for back pain and joint pain.  Neurological: Negative for sensory change, speech change, focal weakness and weakness.  Psychiatric/Behavioral: Negative for depression and hallucinations. The patient is not nervous/anxious.    Tolerating Diet:yes Tolerating PT: ambulatory  DRUG ALLERGIES:   Allergies  Allergen Reactions  . Contrast Media [Iodinated Diagnostic Agents] Shortness Of Breath    Hypoxia/hypotension  . Versed [Midazolam] Other (See Comments)    Hypotension/hypoxia    VITALS:  Blood pressure 113/61, pulse (!) 57, temperature 97.9 F (36.6 C), temperature source Oral, resp. rate (!) 22, height 5\' 7"  (1.702 m), weight 126.9 kg (279 lb 12.2 oz), SpO2 94 %.  PHYSICAL EXAMINATION:   Physical Exam  GENERAL:  62 y.o.-year-old patient lying in the bed with no acute distress. Morbidly obese EYES: Pupils equal, round, reactive to light and accommodation. No scleral icterus. Extraocular muscles intact.  HEENT: Head atraumatic, normocephalic. Oropharynx and nasopharynx clear. NECK:  Supple, no jugular venous distention. No thyroid enlargement, no  tenderness.  LUNGS: Normal breath sounds bilaterally, no wheezing, rales, rhonchi. No use of accessory muscles of respiration.  CARDIOVASCULAR: S1, S2 normal. No murmurs, rubs, or gallops.  ABDOMEN: Soft, nontender, nondistended. Bowel sounds present. No organomegaly or mass. Abdominal obesity EXTREMITIES: No cyanosis, clubbing or edema b/l.    NEUROLOGIC: Cranial nerves II through XII are intact. No focal Motor or sensory deficits b/l.   PSYCHIATRIC:  patient is alert and oriented x 3.  SKIN: No obvious rash, lesion, or ulcer.   LABORATORY PANEL:  CBC Recent Labs  Lab 10/31/17 0347  WBC 10.5  HGB 11.5*  HCT 33.6*  PLT 201    Chemistries  Recent Labs  Lab 10/30/17 1535 10/31/17 0347  NA 137 136  K 4.5 4.5  CL 102 102  CO2 23 24  GLUCOSE 174* 282*  BUN 29* 33*  CREATININE 1.21 1.17  CALCIUM 8.5* 8.0*  AST 42*  --   ALT 37  --   ALKPHOS 65  --   BILITOT 1.4*  --    Cardiac Enzymes Recent Labs  Lab 10/29/17 2102  TROPONINI 8.81*   RADIOLOGY:  Dg Chest Port 1 View  Result Date: 10/30/2017 CLINICAL DATA:  62 year old male with shortness of breath. Recent NSTEMI. EXAM: PORTABLE CHEST 1 VIEW COMPARISON:  10/28/2017 and earlier. FINDINGS: Portable AP upright view at 1517 hours. Lower lung volumes. Stable cardiomegaly and mediastinal contours. Prior CABG. No pneumothorax, pulmonary edema or definite pleural effusion. Chronic perihilar scarring. Mildly increased retrocardiac opacity along the left hemidiaphragm most resembles atelectasis. Paucity of bowel gas in the upper abdomen. IMPRESSION: Lower lung volumes with mild atelectasis. No other acute cardiopulmonary abnormality. Electronically Signed   By: Lemmie Evens  [place] Crane M.D.   On: 10/30/2017 15:33   ASSESSMENT AND PLAN:   Marcus Williams  is a 62 y.o. male with a known history per below which also includes recent non-STEMI-discharged home 5 days ago status post stent placement, heart catheterization done May 10 noted for diffuse coronary  artery disease, presenting with acute intermittent chest pain since yesterday, patient states that it is sharp, lasted few seconds, nonradiating, dyspnea on exertion, associated with shortness of breath, no better with nitroglycerin taken 3 times earlier today  *Acute hypoxic respiratory failure and hypertension during cardiac cath procedure. -Presumed I contrast allergy although patient has had contrast in the past. -Received IV Solu-Medrol -currently off BiPAP. -IV dopamine drip weaned off  *Acute recurrent chest pain with coronary artery disease status post CABG and coronary artery stenting -Most likely secondary to acute non-STEMI, hospitalization last week for non-STEMI status post heart catheterization noted for diffuse coronary artery disease, status post stenting at that time -cont asa, statin therapy, Plavix - Eliquis currently on hold given recent spinal injection on yesterday -cont lisinopril, Lopressor -appreciate cardiology input by Dr. Ubaldo Glassing -cardiac cath problem reported not show any acute blockage.   \*Acute on chronic systolic/diastolic congestive heart failure exacerbation Congestive heart failure protocol, IV Lasix, lisinopril, Lopressor, Plavix, statin therapy, strict I&O monitoring, daily weights  *History of paroxysmal A. Fib Currently in sinus rhythm Eliquis currently on hold for cardiac cath plan on Friday  continue Cardizem, metoprolol  *Chronic diabetes mellitus type 2 Stable Hold metformin, continue NovoLog at reduced dose of 60 units twice daily, sliding scale insulin with Accu-Cheks per routine  *Chronic obstructive sleep apnea May use home device at bedtime/as needed  *COPD without exacerbation Breathing treatments as needed  Transfer to 2 A  Case discussed with Care Management/Social Worker. Management plans discussed with the patient, family and they are in agreement.  CODE STATUS: full  DVT Prophylaxis: heparin  TOTAL TIME TAKING CARE OF  THIS PATIENT: *30* minutes.  >50% time spent on counselling and coordination of care  POSSIBLE D/C IN *2 to 3* DAYS, DEPENDING ON CLINICAL CONDITION.  Note: This dictation was prepared with Dragon dictation along with smaller phrase technology. Any transcriptional errors that result from this process are unintentional.  Fritzi Mandes M.D on 10/31/2017 at 12:51 PM  Between 7am to 6pm - Pager - [phone]  After 6pm go to [email or web address] - password [email or web address] [facility]  Office  [phone]  CC: Primary care physician; Idelle Crouch, MDPatient ID: Marcus Williams, male   DOB: 08-20-1955, 62 y.o.   MRN: [id number]

## 2017-10-31 NOTE — Progress Notes (Signed)
Talked to Dr. Jannifer Franklin about patient's blood sugar of 432, patient has been on a higher side and patient had his last dose of solumedrol. Order to recheck after the insulin is given after an hour. RN will continue to monitor.

## 2017-11-01 LAB — GLUCOSE, CAPILLARY
GLUCOSE-CAPILLARY: 246 mg/dL — AB (ref 65–99)
Glucose-Capillary: 232 mg/dL — ABNORMAL HIGH (ref 65–99)
Glucose-Capillary: 320 mg/dL — ABNORMAL HIGH (ref 65–99)

## 2017-11-01 MED ORDER — ZOLPIDEM TARTRATE 5 MG PO TABS: 5.0000 mg | ORAL_TABLET | Freq: Every evening | ORAL | 0 refills | Status: AC | PRN

## 2017-11-01 MED ORDER — INSULIN REGULAR HUMAN 100 UNIT/ML IJ SOLN
20.0000 [IU] | Freq: Once | INTRAMUSCULAR | Status: AC
  Administered 2017-11-01: 20 [IU] via INTRAVENOUS
  Filled 2017-11-01: qty 0.2

## 2017-11-01 NOTE — Progress Notes (Signed)
[name] to be D/C'd Home per MD order.  Discussed prescriptions and follow up appointments with the patient. Prescriptions were eprescribed, medication list explained in detail. Pt verbalized understanding.  Allergies as of 11/01/2017      Reactions   Contrast Media [iodinated Diagnostic Agents] Shortness Of Breath   Hypoxia/hypotension   Versed [midazolam] Other (See Comments)   Hypotension/hypoxia      Medication List    STOP taking these medications   acetaminophen 325 MG tablet Commonly known as:  TYLENOL   amoxicillin 875 MG tablet Commonly known as:  AMOXIL   predniSONE 10 MG tablet Commonly known as:  DELTASONE     TAKE these medications   albuterol 108 (90 Base) MCG/ACT inhaler Commonly known as:  PROVENTIL HFA;VENTOLIN HFA Inhale 2 puffs into the lungs every 6 (six) hours as needed for wheezing or shortness of breath.   apixaban 5 MG Tabs tablet Commonly known as:  ELIQUIS Take 5 mg by mouth 2 (two) times daily.   atorvastatin 40 MG tablet Commonly known as:  LIPITOR Take 1 tablet (40 mg total) by mouth daily at 6 PM.   baclofen 10 MG tablet Commonly known as:  LIORESAL Take 10 mg by mouth 2 (two) times daily as needed for muscle spasms.   clopidogrel 75 MG tablet Commonly known as:  PLAVIX Take 1 tablet (75 mg total) by mouth daily with breakfast.   diltiazem 180 MG 24 hr capsule Commonly known as:  CARDIZEM CD Take 180 mg by mouth 2 (two) times daily.   DULoxetine 20 MG capsule Commonly known as:  CYMBALTA Take 20 mg by mouth daily.   Fluticasone-Salmeterol 250-50 MCG/DOSE Aepb Commonly known as:  ADVAIR Inhale 1 puff into the lungs 2 (two) times daily.   insulin NPH-regular Human (70-30) 100 UNIT/ML injection Commonly known as:  NOVOLIN 70/30 Inject 85-100 Units into the skin 2 (two) times daily with a meal. 85 units in the morning and 100 units in the evening   lisinopril 10 MG tablet Commonly known as:  PRINIVIL,ZESTRIL Take 1 tablet (10  mg total) by mouth daily.   metFORMIN 1000 MG tablet Commonly known as:  GLUCOPHAGE Take 1,000 mg by mouth 2 (two) times daily with a meal.   metoprolol tartrate 100 MG tablet Commonly known as:  LOPRESSOR Take 100 mg by mouth 2 (two) times daily.   nitroGLYCERIN 0.4 MG SL tablet Commonly known as:  NITROSTAT Place 0.4 mg under the tongue every 5 (five) minutes as needed for chest pain.   pantoprazole 40 MG tablet Commonly known as:  PROTONIX Take 40 mg by mouth daily.   Potassium Chloride ER 20 MEQ Tbcr Take 20 mEq by mouth daily.   pramipexole 0.5 MG tablet Commonly known as:  MIRAPEX Take 0.5 mg by mouth 3 (three) times daily as needed.   torsemide 20 MG tablet Commonly known as:  DEMADEX Take 2 tablets (40 mg total) by mouth daily.   zolpidem 5 MG tablet Commonly known as:  AMBIEN Take 1 tablet (5 mg total) by mouth at bedtime as needed for sleep.       Vitals:   11/01/17 0350 11/01/17 0754  BP: (!) 105/56 134/70  Pulse: (!) 58 71  Resp: 16   Temp: 97.8 F (36.6 C) 97.6 F (36.4 C)  SpO2: 100% 98%    Tele box removed and returned. Skin clean, dry and intact without evidence of skin break down, no evidence of skin tears noted. IV  catheter discontinued intact. Site without signs and symptoms of complications. Dressing and pressure applied. Pt denies pain at this time. No complaints noted.  An After Visit Summary was printed and given to the patient. Patient escorted via [place], and D/C home via private auto.  Rolley Sims

## 2017-11-01 NOTE — Discharge Summary (Signed)
[facility] [place] NAME: Jayceon Troy    MR#:  [id number]  DATE OF BIRTH:  1955-09-28  DATE OF ADMISSION:  10/28/2017 ADMITTING PHYSICIAN: Avel Peace Salary, MD  DATE OF DISCHARGE: 11/01/2017  PRIMARY CARE PHYSICIAN: Idelle Crouch, MD    ADMISSION DIAGNOSIS:  ST elevation myocardial infarction (STEMI), unspecified artery (HCC) [I21.3] NSTEMI (non-ST elevated myocardial infarction) ([place]) [I21.4]  DISCHARGE DIAGNOSIS:  Acute NSTEMI s/p cardiac cath with stable CAD acute hypoxic respiratory failure suspect allergy to contrast versus two were said now resolved CAD SECONDARY DIAGNOSIS:   Past Medical History:  Diagnosis Date  . A-fib ([place])   . Aortic stenosis, moderate 11/2015  . Arthritis   . CAD (coronary artery disease)   . Chronic systolic CHF (congestive heart failure) ([place])   . Diabetes mellitus without complication ([place])    INSULIN DEPENDENT  . GERD (gastroesophageal reflux disease)   . History of cardioversion   . Hypertension   . MI (myocardial infarction) ([place])   . OSA on CPAP   . Sepsis ([place])   . Shortness of breath dyspnea   . Stroke ([place])   . SVT (supraventricular tachycardia) [facility])     HOSPITAL COURSE:   AlanJonesis a62 y.o.malewith a known history per below which also includes recent non-STEMI-discharged home 5 days ago status post stent placement, heart catheterization done May 10 noted for diffuse coronary artery disease, presenting with acute intermittent chest pain since yesterday, patient states that it is sharp, lasted few seconds, nonradiating, dyspnea on exertion, associated with shortness of breath, no better with nitroglycerin taken 3 times earlier today  *Acute hypoxic respiratory failure and hypertension during cardiac cath procedure. -Presumed I contrast allergy although patient has had contrast in the past. -Received IV Solu-Medrol---now discontinued -currently off BiPAP.---Sets 98% on  room air -IV dopamine drip weaned off  *Acute recurrent chest pain with coronary artery disease status post CABG and coronary artery stenting -Most likely secondary to acute non-STEMI, hospitalization last week for non-STEMI status post heart catheterization noted for diffuse coronary artery disease, status post stenting at that time -cont asa, statin therapy, Plavix, eliquis -cont lisinopril, Lopressor -appreciate cardiology input by Dr. Ubaldo Glassing -cardiac cath problem reported not show any acute blockage.  \*Acute on chronic systolic/diastolic congestive heart failure exacerbation Congestive heart failure protocol, po Lasix, lisinopril, Lopressor, Plavix, statin therapy, strict I&O monitoring, daily weights  *History of paroxysmal A. Fib Currently in sinus rhythm Eliquis resumed at discharge  continue Cardizem, metoprolol  *Chronic diabetes mellitus type 2 Stable resumed metformin, continue NovoLog at reduced dose of 60 units twice daily, sliding scale insulin with Accu-Cheks per routine  *Chronic obstructive sleep apnea May use home device at bedtime/as needed  *COPD without exacerbation Breathing treatments as needed  Overall at baseline. Patient feels a lot better. Agreeable to go home. He'll follow-up with Dr. Doy Hutching and Dr. Ubaldo Glassing as outpatient CONSULTS OBTAINED:  Treatment Team:  Teodoro Spray, MD  DRUG ALLERGIES:   Allergies  Allergen Reactions  . Contrast Media [Iodinated Diagnostic Agents] Shortness Of Breath    Hypoxia/hypotension  . Versed [Midazolam] Other (See Comments)    Hypotension/hypoxia    DISCHARGE MEDICATIONS:   Allergies as of 11/01/2017      Reactions   Contrast Media [iodinated Diagnostic Agents] Shortness Of Breath   Hypoxia/hypotension   Versed [midazolam] Other (See Comments)   Hypotension/hypoxia      Medication List    STOP taking these medications  acetaminophen 325 MG tablet Commonly known as:  TYLENOL   amoxicillin 875 MG  tablet Commonly known as:  AMOXIL   predniSONE 10 MG tablet Commonly known as:  DELTASONE     TAKE these medications   albuterol 108 (90 Base) MCG/ACT inhaler Commonly known as:  PROVENTIL HFA;VENTOLIN HFA Inhale 2 puffs into the lungs every 6 (six) hours as needed for wheezing or shortness of breath.   apixaban 5 MG Tabs tablet Commonly known as:  ELIQUIS Take 5 mg by mouth 2 (two) times daily.   atorvastatin 40 MG tablet Commonly known as:  LIPITOR Take 1 tablet (40 mg total) by mouth daily at 6 PM.   baclofen 10 MG tablet Commonly known as:  LIORESAL Take 10 mg by mouth 2 (two) times daily as needed for muscle spasms.   clopidogrel 75 MG tablet Commonly known as:  PLAVIX Take 1 tablet (75 mg total) by mouth daily with breakfast.   diltiazem 180 MG 24 hr capsule Commonly known as:  CARDIZEM CD Take 180 mg by mouth 2 (two) times daily.   DULoxetine 20 MG capsule Commonly known as:  CYMBALTA Take 20 mg by mouth daily.   Fluticasone-Salmeterol 250-50 MCG/DOSE Aepb Commonly known as:  ADVAIR Inhale 1 puff into the lungs 2 (two) times daily.   insulin NPH-regular Human (70-30) 100 UNIT/ML injection Commonly known as:  NOVOLIN 70/30 Inject 85-100 Units into the skin 2 (two) times daily with a meal. 85 units in the morning and 100 units in the evening   lisinopril 10 MG tablet Commonly known as:  PRINIVIL,ZESTRIL Take 1 tablet (10 mg total) by mouth daily.   metFORMIN 1000 MG tablet Commonly known as:  GLUCOPHAGE Take 1,000 mg by mouth 2 (two) times daily with a meal.   metoprolol tartrate 100 MG tablet Commonly known as:  LOPRESSOR Take 100 mg by mouth 2 (two) times daily.   nitroGLYCERIN 0.4 MG SL tablet Commonly known as:  NITROSTAT Place 0.4 mg under the tongue every 5 (five) minutes as needed for chest pain.   pantoprazole 40 MG tablet Commonly known as:  PROTONIX Take 40 mg by mouth daily.   Potassium Chloride ER 20 MEQ Tbcr Take 20 mEq by mouth  daily.   pramipexole 0.5 MG tablet Commonly known as:  MIRAPEX Take 0.5 mg by mouth 3 (three) times daily as needed.   torsemide 20 MG tablet Commonly known as:  DEMADEX Take 2 tablets (40 mg total) by mouth daily.   zolpidem 5 MG tablet Commonly known as:  AMBIEN Take 1 tablet (5 mg total) by mouth at bedtime as needed for sleep.       If you experience worsening of your admission symptoms, develop shortness of breath, life threatening emergency, suicidal or homicidal thoughts you must seek medical attention immediately by calling 911 or calling your MD immediately  if symptoms less severe.  You Must read complete instructions/literature along with all the possible adverse reactions/side effects for all the Medicines you take and that have been prescribed to you. Take any new Medicines after you have completely understood and accept all the possible adverse reactions/side effects.   Please note  You were cared for by a hospitalist during your hospital stay. If you have any questions about your discharge medications or the care you received while you were in the hospital after you are discharged, you can call the unit and asked to speak with the hospitalist on call if the hospitalist that took  care of you is not available. Once you are discharged, your primary care physician will handle any further medical issues. Please note that NO REFILLS for any discharge medications will be authorized once you are discharged, as it is imperative that you return to your primary care physician (or establish a relationship with a primary care physician if you do not have one) for your aftercare needs so that they can reassess your need for medications and monitor your lab values. Today   SUBJECTIVE   I feel great I want to go home  VITAL SIGNS:  Blood pressure 134/70, pulse 71, temperature 97.6 F (36.4 C), temperature source Oral, resp. rate 16, height 5\' 7"  (1.702 m), weight 129.2 kg (284 lb 14.4  oz), SpO2 98 %.  I/O:    Intake/Output Summary (Last 24 hours) at 11/01/2017 1213 Last data filed at 11/01/2017 1036 Gross per 24 hour  Intake 510 ml  Output 3100 ml  Net -2590 ml    PHYSICAL EXAMINATION:  GENERAL:  62 y.o.-year-old patient lying in the bed with no acute distress. Morbid obesity EYES: Pupils equal, round, reactive to light and accommodation. No scleral icterus. Extraocular muscles intact.  HEENT: Head atraumatic, normocephalic. Oropharynx and nasopharynx clear.  NECK:  Supple, no jugular venous distention. No thyroid enlargement, no tenderness.  LUNGS: Normal breath sounds bilaterally, no wheezing, rales,rhonchi or crepitation. No use of accessory muscles of respiration.  CARDIOVASCULAR: S1, S2 normal. No murmurs, rubs, or gallops.  ABDOMEN: Soft, non-tender, non-distended. Bowel sounds present. No organomegaly or mass.  EXTREMITIES: No pedal edema, cyanosis, or clubbing.  NEUROLOGIC: Cranial nerves II through XII are intact. Muscle strength 5/5 in all extremities. Sensation intact. Gait not checked.  PSYCHIATRIC: The patient is alert and oriented x 3.  SKIN: No obvious rash, lesion, or ulcer.   DATA REVIEW:   CBC  Recent Labs  Lab 10/31/17 0347  WBC 10.5  HGB 11.5*  HCT 33.6*  PLT 201    Chemistries  Recent Labs  Lab 10/30/17 1535 10/31/17 0347  NA 137 136  K 4.5 4.5  CL 102 102  CO2 23 24  GLUCOSE 174* 282*  BUN 29* 33*  CREATININE 1.21 1.17  CALCIUM 8.5* 8.0*  AST 42*  --   ALT 37  --   ALKPHOS 65  --   BILITOT 1.4*  --     Microbiology Results   Recent Results (from the past 240 hour(s))  MRSA PCR Screening     Status: None   Collection Time: 10/30/17  2:39 PM  Result Value Ref Range Status   MRSA by PCR NEGATIVE NEGATIVE Final    Comment:        The GeneXpert MRSA Assay (FDA approved for NASAL specimens only), is one component of a comprehensive MRSA colonization surveillance program. It is not intended to diagnose  MRSA infection nor to guide or monitor treatment for MRSA infections. Performed at [facility], [address] [place],  [address]     RADIOLOGY:  Dg Chest Port 1 View  Result Date: 10/30/2017 CLINICAL DATA:  62 year old male with shortness of breath. Recent NSTEMI. EXAM: PORTABLE CHEST 1 VIEW COMPARISON:  10/28/2017 and earlier. FINDINGS: Portable AP upright view at 1517 hours. Lower lung volumes. Stable cardiomegaly and mediastinal contours. Prior CABG. No pneumothorax, pulmonary edema or definite pleural effusion. Chronic perihilar scarring. Mildly increased retrocardiac opacity along the left hemidiaphragm most resembles atelectasis. Paucity of bowel gas in the upper abdomen. IMPRESSION: Lower lung volumes  with mild atelectasis. No other acute cardiopulmonary abnormality. Electronically Signed   By: Genevie Ann M.D.   On: 10/30/2017 15:33     Management plans discussed with the patient, family and they are in agreement.  CODE STATUS:     Code Status Orders  (From admission, onward)        Start     Ordered   10/28/17 1958  Full code  Continuous     10/28/17 1958    Code Status History    Date Active Date Inactive Code Status Order ID Comments User Context   10/22/2017 0551 10/24/2017 1801 Full Code [id number]  Arta Silence, MD ED   08/10/2017 2205 08/14/2017 1512 Full Code [id number]  Vaughan Basta, MD Inpatient   07/31/2017 2045 08/02/2017 1648 Full Code [id number]  Henreitta Leber, MD Inpatient   07/28/2017 2230 07/31/2017 1428 Full Code [id number]  Dustin Flock, MD Inpatient   03/01/2017 0203 03/01/2017 2004 Full Code [id number]  Hugelmeyer, Alexis, DO Inpatient   11/21/2015 1620 11/23/2015 1710 Full Code [id number]  Sherren Mocha, MD Inpatient   05/06/2015 1545 1September 01, 202016 1609 Full Code [id number]  Sharlotte Alamo, MD Inpatient   05/04/2015 1951 05/06/2015 1545 Full Code [id number]  Vaughan Basta, MD ED   01/14/2015 1416 01/15/2015 1514 Full Code  [id number]  Lance Coon, MD Inpatient   12/22/2014 1825 12/23/2014 1612 Full Code [id number]  Epifanio Lesches, MD ED   10/14/2014 1214 10/15/2014 1350 Full Code [id number]  Alfonso Patten, RN Inpatient    Advance Directive Documentation     Most Recent Value  Type of Advance Directive  Healthcare Power of Attorney, Living will  Pre-existing out of facility DNR order (yellow form or pink MOST form)  -  "MOST" Form in Place?  -      TOTAL TIME TAKING CARE OF THIS PATIENT: 40 minutes.    Fritzi Mandes M.D on 11/01/2017 at 12:13 PM  Between 7am to 6pm - Pager - [phone] After 6pm go to [email or web address] - password [email or web address]  Office  [phone]  CC: Primary care physician; Idelle Crouch, MD

## 2017-11-02 ENCOUNTER — Encounter: Payer: Self-pay | Admitting: Cardiology

## 2017-11-04 DIAGNOSIS — I10 Essential (primary) hypertension: Secondary | ICD-10-CM | POA: Diagnosis not present

## 2017-11-04 DIAGNOSIS — E7800 Pure hypercholesterolemia, unspecified: Secondary | ICD-10-CM | POA: Diagnosis not present

## 2017-11-04 DIAGNOSIS — I480 Paroxysmal atrial fibrillation: Secondary | ICD-10-CM | POA: Diagnosis not present

## 2017-11-04 DIAGNOSIS — I350 Nonrheumatic aortic (valve) stenosis: Secondary | ICD-10-CM | POA: Diagnosis not present

## 2017-11-04 DIAGNOSIS — I63541 Cerebral infarction due to unspecified occlusion or stenosis of right cerebellar artery: Secondary | ICD-10-CM | POA: Diagnosis not present

## 2017-11-04 DIAGNOSIS — I25810 Atherosclerosis of coronary artery bypass graft(s) without angina pectoris: Secondary | ICD-10-CM | POA: Diagnosis not present

## 2017-11-05 DIAGNOSIS — L97511 Non-pressure chronic ulcer of other part of right foot limited to breakdown of skin: Secondary | ICD-10-CM | POA: Diagnosis not present

## 2017-11-12 DIAGNOSIS — E1142 Type 2 diabetes mellitus with diabetic polyneuropathy: Secondary | ICD-10-CM | POA: Diagnosis not present

## 2017-11-12 DIAGNOSIS — J4540 Moderate persistent asthma, uncomplicated: Secondary | ICD-10-CM | POA: Diagnosis not present

## 2017-11-12 DIAGNOSIS — I25810 Atherosclerosis of coronary artery bypass graft(s) without angina pectoris: Secondary | ICD-10-CM | POA: Diagnosis not present

## 2017-11-12 DIAGNOSIS — R1310 Dysphagia, unspecified: Secondary | ICD-10-CM | POA: Diagnosis not present

## 2017-11-12 DIAGNOSIS — I10 Essential (primary) hypertension: Secondary | ICD-10-CM | POA: Diagnosis not present

## 2017-11-12 DIAGNOSIS — E7800 Pure hypercholesterolemia, unspecified: Secondary | ICD-10-CM | POA: Diagnosis not present

## 2017-11-17 DIAGNOSIS — R197 Diarrhea, unspecified: Secondary | ICD-10-CM | POA: Diagnosis not present

## 2017-11-17 DIAGNOSIS — R110 Nausea: Secondary | ICD-10-CM | POA: Diagnosis not present

## 2017-11-17 DIAGNOSIS — R1084 Generalized abdominal pain: Secondary | ICD-10-CM | POA: Diagnosis not present

## 2017-11-17 DIAGNOSIS — R0989 Other specified symptoms and signs involving the circulatory and respiratory systems: Secondary | ICD-10-CM | POA: Diagnosis not present

## 2017-11-17 DIAGNOSIS — K551 Chronic vascular disorders of intestine: Secondary | ICD-10-CM | POA: Diagnosis not present

## 2017-11-29 ENCOUNTER — Emergency Department: Payer: Medicare HMO

## 2017-11-29 ENCOUNTER — Observation Stay
Admission: EM | Admit: 2017-11-29 | Discharge: 2017-11-30 | Disposition: A | Discharge status: Home / Self Care | Payer: Medicare HMO | Attending: Internal Medicine | Admitting: Internal Medicine

## 2017-11-29 ENCOUNTER — Other Ambulatory Visit: Payer: Self-pay

## 2017-11-29 DIAGNOSIS — Z794 Long term (current) use of insulin: Secondary | ICD-10-CM | POA: Insufficient documentation

## 2017-11-29 DIAGNOSIS — G4733 Obstructive sleep apnea (adult) (pediatric): Secondary | ICD-10-CM | POA: Insufficient documentation

## 2017-11-29 DIAGNOSIS — I471 Supraventricular tachycardia: Secondary | ICD-10-CM | POA: Diagnosis not present

## 2017-11-29 DIAGNOSIS — R7989 Other specified abnormal findings of blood chemistry: Secondary | ICD-10-CM | POA: Diagnosis present

## 2017-11-29 DIAGNOSIS — Z79899 Other long term (current) drug therapy: Secondary | ICD-10-CM | POA: Insufficient documentation

## 2017-11-29 DIAGNOSIS — Z7901 Long term (current) use of anticoagulants: Secondary | ICD-10-CM | POA: Diagnosis not present

## 2017-11-29 DIAGNOSIS — K219 Gastro-esophageal reflux disease without esophagitis: Secondary | ICD-10-CM | POA: Diagnosis not present

## 2017-11-29 DIAGNOSIS — Z951 Presence of aortocoronary bypass graft: Secondary | ICD-10-CM | POA: Diagnosis not present

## 2017-11-29 DIAGNOSIS — Z8249 Family history of ischemic heart disease and other diseases of the circulatory system: Secondary | ICD-10-CM | POA: Insufficient documentation

## 2017-11-29 DIAGNOSIS — R778 Other specified abnormalities of plasma proteins: Secondary | ICD-10-CM | POA: Diagnosis present

## 2017-11-29 DIAGNOSIS — R748 Abnormal levels of other serum enzymes: Secondary | ICD-10-CM | POA: Insufficient documentation

## 2017-11-29 DIAGNOSIS — Z955 Presence of coronary angioplasty implant and graft: Secondary | ICD-10-CM | POA: Diagnosis not present

## 2017-11-29 DIAGNOSIS — T17308A Unspecified foreign body in larynx causing other injury, initial encounter: Secondary | ICD-10-CM

## 2017-11-29 DIAGNOSIS — I252 Old myocardial infarction: Secondary | ICD-10-CM | POA: Diagnosis not present

## 2017-11-29 DIAGNOSIS — Z7951 Long term (current) use of inhaled steroids: Secondary | ICD-10-CM | POA: Diagnosis not present

## 2017-11-29 DIAGNOSIS — Z89421 Acquired absence of other right toe(s): Secondary | ICD-10-CM | POA: Diagnosis not present

## 2017-11-29 DIAGNOSIS — E119 Type 2 diabetes mellitus without complications: Secondary | ICD-10-CM | POA: Diagnosis not present

## 2017-11-29 DIAGNOSIS — I110 Hypertensive heart disease with heart failure: Secondary | ICD-10-CM | POA: Diagnosis not present

## 2017-11-29 DIAGNOSIS — E669 Obesity, unspecified: Secondary | ICD-10-CM | POA: Insufficient documentation

## 2017-11-29 DIAGNOSIS — I482 Chronic atrial fibrillation: Secondary | ICD-10-CM | POA: Insufficient documentation

## 2017-11-29 DIAGNOSIS — R0602 Shortness of breath: Secondary | ICD-10-CM | POA: Diagnosis not present

## 2017-11-29 DIAGNOSIS — I5022 Chronic systolic (congestive) heart failure: Secondary | ICD-10-CM | POA: Diagnosis not present

## 2017-11-29 DIAGNOSIS — J449 Chronic obstructive pulmonary disease, unspecified: Secondary | ICD-10-CM | POA: Diagnosis not present

## 2017-11-29 DIAGNOSIS — R0601 Orthopnea: Secondary | ICD-10-CM

## 2017-11-29 DIAGNOSIS — I2510 Atherosclerotic heart disease of native coronary artery without angina pectoris: Secondary | ICD-10-CM | POA: Insufficient documentation

## 2017-11-29 DIAGNOSIS — I214 Non-ST elevation (NSTEMI) myocardial infarction: Secondary | ICD-10-CM | POA: Diagnosis not present

## 2017-11-29 DIAGNOSIS — Z8673 Personal history of transient ischemic attack (TIA), and cerebral infarction without residual deficits: Secondary | ICD-10-CM | POA: Insufficient documentation

## 2017-11-29 DIAGNOSIS — R0989 Other specified symptoms and signs involving the circulatory and respiratory systems: Secondary | ICD-10-CM | POA: Insufficient documentation

## 2017-11-29 DIAGNOSIS — R51 Headache: Secondary | ICD-10-CM | POA: Diagnosis not present

## 2017-11-29 DIAGNOSIS — J984 Other disorders of lung: Secondary | ICD-10-CM | POA: Diagnosis not present

## 2017-11-29 DIAGNOSIS — Z7902 Long term (current) use of antithrombotics/antiplatelets: Secondary | ICD-10-CM | POA: Diagnosis not present

## 2017-11-29 DIAGNOSIS — Z6841 Body Mass Index (BMI) 40.0 and over, adult: Secondary | ICD-10-CM | POA: Insufficient documentation

## 2017-11-29 LAB — URINALYSIS, COMPLETE (UACMP) WITH MICROSCOPIC
BACTERIA UA: NONE SEEN
Bilirubin Urine: NEGATIVE
GLUCOSE, UA: 50 mg/dL — AB
Hgb urine dipstick: NEGATIVE
Ketones, ur: NEGATIVE mg/dL
Leukocytes, UA: NEGATIVE
NITRITE: NEGATIVE
PROTEIN: NEGATIVE mg/dL
Specific Gravity, Urine: 1.006 (ref 1.005–1.030)
Squamous Epithelial / LPF: NONE SEEN (ref 0–5)
WBC, UA: NONE SEEN WBC/hpf (ref 0–5)
pH: 5 (ref 5.0–8.0)

## 2017-11-29 LAB — BASIC METABOLIC PANEL
Anion gap: 10 (ref 5–15)
BUN: 24 mg/dL — ABNORMAL HIGH (ref 6–20)
CALCIUM: 7.8 mg/dL — AB (ref 8.9–10.3)
CO2: 23 mmol/L (ref 22–32)
CREATININE: 0.83 mg/dL (ref 0.61–1.24)
Chloride: 104 mmol/L (ref 101–111)
GFR calc non Af Amer: >60 mL/min (ref >60)
Glucose, Bld: 179 mg/dL — ABNORMAL HIGH (ref 65–99)
Potassium: 4 mmol/L (ref 3.5–5.1)
SODIUM: 137 mmol/L (ref 135–145)

## 2017-11-29 LAB — HEPATIC FUNCTION PANEL
ALT: 22 U/L (ref 17–63)
AST: 29 U/L (ref 15–41)
Albumin: 3.8 g/dL (ref 3.5–5.0)
Alkaline Phosphatase: 46 U/L (ref 38–126)
Total Bilirubin: 0.7 mg/dL (ref 0.3–1.2)
Total Protein: 7 g/dL (ref 6.5–8.1)

## 2017-11-29 LAB — CBC
HCT: 35.3 % — ABNORMAL LOW (ref 40.0–52.0)
Hemoglobin: 12.2 g/dL — ABNORMAL LOW (ref 13.0–18.0)
MCH: 30 pg (ref 26.0–34.0)
MCHC: 34.5 g/dL (ref 32.0–36.0)
MCV: 86.9 fL (ref 80.0–100.0)
PLATELETS: 145 10*3/uL — AB (ref 150–440)
RBC: 4.06 MIL/uL — ABNORMAL LOW (ref 4.40–5.90)
RDW: 15.1 % — AB (ref 11.5–14.5)
WBC: 6.5 10*3/uL (ref 3.8–10.6)

## 2017-11-29 LAB — TROPONIN I
TROPONIN I: 0.15 ng/mL — AB (ref <0.03)
TROPONIN I: 0.19 ng/mL — AB (ref <0.03)
TROPONIN I: 0.24 ng/mL — AB (ref <0.03)
TROPONIN I: 0.18 ng/mL — AB (ref <0.03)
Troponin I: 0.25 ng/mL (ref <0.03)

## 2017-11-29 LAB — BRAIN NATRIURETIC PEPTIDE: B Natriuretic Peptide: 365 pg/mL — ABNORMAL HIGH (ref 0.0–100.0)

## 2017-11-29 LAB — GLUCOSE, CAPILLARY
GLUCOSE-CAPILLARY: 179 mg/dL — AB (ref 65–99)
Glucose-Capillary: 96 mg/dL (ref 65–99)

## 2017-11-29 MED ORDER — POTASSIUM CHLORIDE CRYS ER 20 MEQ PO TBCR
20.0000 meq | EXTENDED_RELEASE_TABLET | Freq: Every day | ORAL | Status: DC
  Administered 2017-11-30: 20 meq via ORAL
  Filled 2017-11-29: qty 1

## 2017-11-29 MED ORDER — LISINOPRIL 10 MG PO TABS
10.0000 mg | ORAL_TABLET | Freq: Every day | ORAL | Status: DC
  Administered 2017-11-30: 10 mg via ORAL
  Filled 2017-11-29: qty 1

## 2017-11-29 MED ORDER — APIXABAN 5 MG PO TABS
5.0000 mg | ORAL_TABLET | Freq: Two times a day (BID) | ORAL | Status: DC
  Administered 2017-11-29 – 2017-11-30 (×2): 5 mg via ORAL
  Filled 2017-11-29 (×2): qty 1

## 2017-11-29 MED ORDER — NITROGLYCERIN 0.4 MG SL SUBL: 0.4000 mg | SUBLINGUAL_TABLET | SUBLINGUAL | Status: DC | PRN

## 2017-11-29 MED ORDER — TORSEMIDE 20 MG PO TABS
40.0000 mg | ORAL_TABLET | Freq: Every day | ORAL | Status: DC
  Administered 2017-11-30: 40 mg via ORAL
  Filled 2017-11-29: qty 2

## 2017-11-29 MED ORDER — CLOPIDOGREL BISULFATE 75 MG PO TABS
75.0000 mg | ORAL_TABLET | Freq: Every day | ORAL | Status: DC
  Administered 2017-11-30: 75 mg via ORAL
  Filled 2017-11-29: qty 1

## 2017-11-29 MED ORDER — BACLOFEN 10 MG PO TABS
10.0000 mg | ORAL_TABLET | Freq: Two times a day (BID) | ORAL | Status: DC | PRN
  Filled 2017-11-29: qty 1

## 2017-11-29 MED ORDER — ACETAMINOPHEN 325 MG PO TABS
650.0000 mg | ORAL_TABLET | ORAL | Status: DC | PRN
  Administered 2017-11-29: 650 mg via ORAL
  Filled 2017-11-29: qty 2

## 2017-11-29 MED ORDER — ALBUTEROL SULFATE (2.5 MG/3ML) 0.083% IN NEBU: 3.0000 mL | INHALATION_SOLUTION | Freq: Four times a day (QID) | RESPIRATORY_TRACT | Status: DC | PRN

## 2017-11-29 MED ORDER — ONDANSETRON HCL 4 MG/2ML IJ SOLN: 4.0000 mg | Freq: Four times a day (QID) | INTRAMUSCULAR | Status: DC | PRN

## 2017-11-29 MED ORDER — ZOLPIDEM TARTRATE 5 MG PO TABS: 5.0000 mg | ORAL_TABLET | Freq: Every evening | ORAL | Status: DC | PRN

## 2017-11-29 MED ORDER — ALBUTEROL SULFATE (2.5 MG/3ML) 0.083% IN NEBU: 5.0000 mg | INHALATION_SOLUTION | Freq: Once | RESPIRATORY_TRACT | Status: DC

## 2017-11-29 MED ORDER — ATORVASTATIN CALCIUM 20 MG PO TABS
40.0000 mg | ORAL_TABLET | Freq: Every day | ORAL | Status: DC
  Administered 2017-11-29: 40 mg via ORAL
  Filled 2017-11-29: qty 2

## 2017-11-29 MED ORDER — INSULIN ASPART PROT & ASPART (70-30 MIX) 100 UNIT/ML ~~LOC~~ SUSP
85.0000 [IU] | Freq: Two times a day (BID) | SUBCUTANEOUS | Status: DC
  Filled 2017-11-29: qty 1

## 2017-11-29 MED ORDER — INSULIN ASPART 100 UNIT/ML ~~LOC~~ SOLN
0.0000 [IU] | Freq: Three times a day (TID) | SUBCUTANEOUS | Status: DC
  Administered 2017-11-30 (×2): 2 [IU] via SUBCUTANEOUS
  Filled 2017-11-29 (×2): qty 1

## 2017-11-29 MED ORDER — DULOXETINE HCL 20 MG PO CPEP
20.0000 mg | ORAL_CAPSULE | Freq: Every day | ORAL | Status: DC
  Administered 2017-11-30: 20 mg via ORAL
  Filled 2017-11-29: qty 1

## 2017-11-29 MED ORDER — INSULIN ASPART 100 UNIT/ML ~~LOC~~ SOLN: 0.0000 [IU] | Freq: Every day | SUBCUTANEOUS | Status: DC

## 2017-11-29 MED ORDER — PANTOPRAZOLE SODIUM 40 MG PO TBEC
40.0000 mg | DELAYED_RELEASE_TABLET | Freq: Every day | ORAL | Status: DC
  Administered 2017-11-30: 40 mg via ORAL
  Filled 2017-11-29: qty 1

## 2017-11-29 MED ORDER — BUTALBITAL-APAP-CAFFEINE 50-325-40 MG PO TABS
1.0000 | ORAL_TABLET | ORAL | Status: DC | PRN
  Administered 2017-11-29 – 2017-11-30 (×2): 1 via ORAL
  Filled 2017-11-29 (×4): qty 1

## 2017-11-29 MED ORDER — PRAMIPEXOLE DIHYDROCHLORIDE 0.25 MG PO TABS: 0.5000 mg | ORAL_TABLET | Freq: Three times a day (TID) | ORAL | Status: DC | PRN

## 2017-11-29 MED ORDER — ACETAMINOPHEN 500 MG PO TABS
1000.0000 mg | ORAL_TABLET | Freq: Once | ORAL | Status: AC
  Administered 2017-11-29: 1000 mg via ORAL
  Filled 2017-11-29: qty 2

## 2017-11-29 MED ORDER — METOPROLOL TARTRATE 50 MG PO TABS
100.0000 mg | ORAL_TABLET | Freq: Two times a day (BID) | ORAL | Status: DC
  Administered 2017-11-29 – 2017-11-30 (×2): 100 mg via ORAL
  Filled 2017-11-29 (×2): qty 2

## 2017-11-29 MED ORDER — DILTIAZEM HCL ER COATED BEADS 180 MG PO CP24
180.0000 mg | ORAL_CAPSULE | Freq: Two times a day (BID) | ORAL | Status: DC
  Administered 2017-11-29 – 2017-11-30 (×2): 180 mg via ORAL
  Filled 2017-11-29 (×2): qty 1

## 2017-11-29 MED ORDER — MOMETASONE FURO-FORMOTEROL FUM 200-5 MCG/ACT IN AERO
2.0000 | INHALATION_SPRAY | Freq: Two times a day (BID) | RESPIRATORY_TRACT | Status: DC
Start: 2017-11-29 — End: 2017-11-30
  Administered 2017-11-29 – 2017-11-30 (×2): 2 via RESPIRATORY_TRACT
  Filled 2017-11-29: qty 8.8

## 2017-11-29 NOTE — ED Notes (Signed)
Pt ambulated around ed with pulse ox on. Pt maintained 02 sat of 97-100% while ambulating

## 2017-11-29 NOTE — ED Notes (Signed)
Pt remains in subwait at this time. No distress noted. Pt denies needs.

## 2017-11-29 NOTE — Progress Notes (Signed)
Family Meeting Note  Advance Directive:yes  Today a meeting took place with the Patient.   The following clinical team members were present during this meeting:MD  The following were discussed:Patient's diagnosis: Shortness of breath, elevated troponin with history of coronary artery disease, non-STEMI in May 2019, status post RCA stent placement headache and other comorbidities as documented below and treatment plan of care discussed in detail with the patient.  He verbalized understanding of the plan.,  . A-fib ([place])   . Aortic stenosis, moderate 11/2015  . Arthritis   . CAD (coronary artery disease)   . Chronic systolic CHF (congestive heart failure) ([place])   . Diabetes mellitus without complication ([place])    INSULIN DEPENDENT  . GERD (gastroesophageal reflux disease)   . History of cardioversion   . Hypertension   . MI (myocardial infarction) ([place])   . OSA on CPAP   . Sepsis ([place])   . Shortness of breath dyspnea   . Stroke ([place])   . SVT (supraventricular tachycardia) ([place])        Patient's progosis: Unable to determine and Goals for treatment: Full Code -Daughter Melissa healthcare power of attorney   Additional follow-up to be provided: Hospitalist, cardiology  Time spent during discussion:18 min  [name], MD

## 2017-11-29 NOTE — ED Provider Notes (Addendum)
[facility] Emergency Department Provider Note  ____________________________________________  Time seen: Approximately 12:30 PM  I have reviewed the triage vital signs and the nursing notes.   HISTORY  Chief Complaint Choking    HPI Marcus Williams is a 62 y.o. male with a history of A. fib on Eliquis, CAD with recent vent placement, presenting with a "choking sensation."  Of note, the patient is a poor historian and has multiple positive review of systems.  The patient reports that he feels like he is "choking" and this is worse when he lays flat; "it's almost like a sore throat but I don't have a sore throat."  Denies any dental issues, drooling, trismus or change in his voice.  He has also had some associated shortness of breath.  2 nights ago, the patient was lying down with his CPAP on and felt that he had to sit up and remove his CPAP to catch his breath.  Since then, he has been unable to lay flat.  He also reports a mild headache without any numbness tingling or weakness.  Tried Tylenol for this which did not help.  He denies any chest pain, pressure, palpitations, lightheadedness or syncope.  His symptoms are not worsened with exertion.  He has not had any lower extremity swelling above his baseline.  He has had a mild nonproductive cough without any ear pain, congestion or rhinorrhea.  Past Medical History:  Diagnosis Date  . A-fib ([place])   . Aortic stenosis, moderate 11/2015  . Arthritis   . CAD (coronary artery disease)   . Chronic systolic CHF (congestive heart failure) ([place])   . Diabetes mellitus without complication ([place])    INSULIN DEPENDENT  . GERD (gastroesophageal reflux disease)   . History of cardioversion   . Hypertension   . MI (myocardial infarction) ([place])   . OSA on CPAP   . Sepsis ([place])   . Shortness of breath dyspnea   . Stroke ([place])   . SVT (supraventricular tachycardia) [facility])     Patient Active Problem List   Diagnosis Date Noted   . Pneumonia 08/10/2017  . NSTEMI (non-ST elevated myocardial infarction) ([place]) 07/28/2017  . TIA (transient ischemic attack) 03/01/2017  . Aortic stenosis 11/21/2015  . Acute on chronic diastolic CHF (congestive heart failure), NYHA class 3 ([place]) 11/21/2015  . Aortic valve, bicuspid 09/24/2015  . Cellulitis 05/04/2015  . Acute respiratory failure with hypoxia ([place]) 03/26/2015  . CHF (congestive heart failure) ([place]) 01/15/2015  . HTN (hypertension) 01/14/2015  . Type II diabetes mellitus ([place]) 01/14/2015  . COPD (chronic obstructive pulmonary disease) ([place]) 01/14/2015  . OSA on CPAP 01/14/2015  . CAD (coronary artery disease) 01/14/2015  . A-fib ([place]) 01/14/2015  . Pain of left calf 01/14/2015  . Acute on chronic systolic CHF (congestive heart failure) ([place]) 01/14/2015  . COPD exacerbation ([place]) 12/22/2014  . Diabetes ([place]) 10/14/2014    Past Surgical History:  Procedure Laterality Date  . 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 BUNIONECTOMY    . CARDIAC CATHETERIZATION N/A 10/02/2015   Procedure: Coronary/Grafts Angiography;  Surgeon: Teodoro Spray, MD;  Location: [place] CV LAB;  Service: Cardiovascular;  Laterality: N/A;  . CARDIAC CATHETERIZATION N/A 11/21/2015   Procedure: Right and Left Heart Cath;  Surgeon: Sherren Mocha, MD;  Location: [place] CV LAB;  Service: Cardiovascular;  Laterality: N/A;  .  CORONARY ARTERY BYPASS GRAFT    . CORONARY STENT INTERVENTION N/A 07/30/2017   Procedure: CORONARY STENT INTERVENTION;  Surgeon: Isaias Cowman, MD;  Location: [place] CV LAB;  Service: Cardiovascular;  Laterality: N/A;  . CORONARY STENT INTERVENTION N/A 10/23/2017   Procedure: CORONARY STENT INTERVENTION;  Surgeon: Yolonda Kida, MD;  Location: [place] CV LAB;  Service: Cardiovascular;  Laterality: N/A;  . KNEE SURGERY    . LEFT HEART CATH  AND CORONARY ANGIOGRAPHY N/A 10/30/2017   Procedure: LEFT HEART CATH AND CORONARY ANGIOGRAPHY;  Surgeon: Teodoro Spray, MD;  Location: [place] CV LAB;  Service: Cardiovascular;  Laterality: N/A;  . LEFT HEART CATH AND CORS/GRAFTS ANGIOGRAPHY N/A 10/22/2017   Procedure: LEFT HEART CATH AND CORS/GRAFTS ANGIOGRAPHY;  Surgeon: Corey Skains, MD;  Location: [place] CV LAB;  Service: Cardiovascular;  Laterality: N/A;  . quadruple bypass    . RIGHT/LEFT HEART CATH AND CORONARY/GRAFT ANGIOGRAPHY N/A 07/30/2017   Procedure: RIGHT/LEFT HEART CATH AND CORONARY/GRAFT ANGIOGRAPHY;  Surgeon: Isaias Cowman, MD;  Location: [place] CV LAB;  Service: Cardiovascular;  Laterality: N/A;  . TEE WITHOUT CARDIOVERSION  11/21/2015  . TEE WITHOUT CARDIOVERSION N/A 11/21/2015   Procedure: TRANSESOPHAGEAL ECHOCARDIOGRAM (TEE);  Surgeon: Larey Dresser, MD;  Location: [place];  Service: Cardiovascular;  Laterality: N/A;    Current Outpatient Rx  . Order #: [id number] Class: Normal  . Order #: [id number] Class: Historical Med  . Order #: [id number] Class: Print  . Order #: [id number] Class: Historical Med  . Order #: [id number] Class: Normal  . Order #: [id number] Class: Historical Med  . Order #: [id number] Class: Historical Med  . Order #: [id number] Class: Historical Med  . Order #: [id number] Class: Historical Med  . Order #: [id number] Class: Normal  . Order #: [id number] Class: Historical Med  . Order #: [id number] Class: Historical Med  . Order #: [id number] Class: Historical Med  . Order #: [id number] Class: Historical Med  . Order #: [id number] Class: Normal  . Order #: [id number] Class: Historical Med  . Order #: [id number] Class: Normal  . Order #: [id number] Class: Normal    Allergies Contrast media [iodinated diagnostic agents] and Versed [midazolam]  Family History  Problem Relation Age of Onset  . Stroke Mother   . Heart attack Mother   . Hypertension Mother   . Heart attack Father   .  Hypertension Father   . Heart attack Brother        #1  . Diabetes Brother        #1  . Heart disease Brother        #2  . Lung disease Brother        #2  . Hypertension Brother        #2  . Diabetes Brother        #2    Social History Social History   Tobacco Use  . Smoking status: Never Smoker  . Smokeless tobacco: Never Used  Substance Use Topics  . Alcohol use: No  . Drug use: No    Review of Systems Constitutional: No fever/chills.  No lightheadedness or syncope. Eyes: No visual changes.  No eye discharge. ENT: No sore throat. No congestion or rhinorrhea.  Positive difficulty swelling.  No drooling, trismus or stridor. Cardiovascular: Denies chest pain. Denies palpitations. Respiratory: Positive shortness of breath.  No cough. Gastrointestinal: No abdominal pain.  No nausea, no vomiting.  No diarrhea.  No constipation. Genitourinary: Negative for dysuria. Musculoskeletal: Negative for back pain.  Unchanged lower extremity edema. Skin: Negative for rash. Neurological:  Positive for headache. No focal numbness, tingling or weakness.     ____________________________________________   PHYSICAL EXAM:  VITAL SIGNS: ED Triage Vitals  Enc Vitals Group     BP 11/29/17 1011 134/62     Pulse Rate 11/29/17 1009 60     Resp 11/29/17 1009 20     Temp 11/29/17 1009 98.2 F (36.8 C)     Temp Source 11/29/17 1009 Oral     SpO2 11/29/17 1009 99 %     Weight 11/29/17 1011 262 lb (118.8 kg)     Height 11/29/17 1011 5\' 7"  (1.702 m)     Head Circumference --      Peak Flow --      Pain Score 11/29/17 1011 7     Pain Loc --      Pain Edu? --      Excl. in [place]? --     Constitutional: Alert and oriented. Answers questions appropriately.  She is chronically ill appearing. Eyes: Conjunctivae are normal.  EOMI. No scleral icterus.  No eye discharge. Head: Atraumatic. Nose: No congestion/rhinnorhea. Mouth/Throat: Mucous membranes are moist.  No erythema of the posterior  pharynx, no tonsillar swelling or exudate.  The posterior palate is symmetric and the uvula is midline.  There is no drooling.  There are no masses or foreign bodies that are visible.  No trismus.  No hoarse voice. Neck: No stridor.  Supple.  No meningismus.  Full range of motion without pain. Cardiovascular: Normal rate, regular rhythm. No murmurs, rubs or gallops.  Respiratory: Highly prolonged expiratory phase.  No accessory muscle use or retractions. Lungs CTAB.  No wheezes, rales or ronchi. Gastrointestinal: Really obese.  Soft, nontender and nondistended.  No guarding or rebound.  No peritoneal signs. Musculoskeletal: Bilateral symmetric pitting lower extremity edema to the mid tibial shaft, mild.. No ttp in the calves or palpable cords.  Negative Homan's sign. Neurologic:  A&Ox3.  Speech is clear.  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  EOMI.  Moves all extremities well. Skin:  Skin is warm, dry; he has multiple old scabs on his legs. No rash noted. Psychiatric: Mood and affect are normal. Speech and behavior are normal.  Normal judgement. ____________________________________________   LABS (all labs ordered are listed, but only abnormal results are displayed)  Labs Reviewed  CBC - Abnormal; Notable for the following components:      Result Value   RBC 4.06 (*)    Hemoglobin 12.2 (*)    HCT 35.3 (*)    RDW 15.1 (*)    Platelets 145 (*)    All other components within normal limits  BASIC METABOLIC PANEL - Abnormal; Notable for the following components:   Glucose, Bld 179 (*)    BUN 24 (*)    Calcium 7.8 (*)    All other components within normal limits  TROPONIN I - Abnormal; Notable for the following components:   Troponin I 0.15 (*)    All other components within normal limits  TROPONIN I - Abnormal; Notable for the following components:   Troponin I 0.19 (*)    All other components within normal limits   BRAIN NATRIURETIC PEPTIDE - Abnormal; Notable for the following components:   B Natriuretic Peptide 365.0 (*)    All other components within normal limits  HEPATIC FUNCTION PANEL - Abnormal; Notable for the following components:   Bilirubin, Direct <0.1 (*)  All other components within normal limits  URINALYSIS, COMPLETE (UACMP) WITH MICROSCOPIC - Abnormal; Notable for the following components:   Color, Urine STRAW (*)    APPearance CLEAR (*)    Glucose, UA 50 (*)    All other components within normal limits   ____________________________________________  EKG  ED ECG REPORT I, Eula Listen, the attending physician, personally viewed and interpreted this ECG.   Date: 11/29/2017  EKG Time: 1013  Rate: 56  Rhythm: sinus bradycardia; pVC  Axis: leftward  Intervals:none  ST&T Change: no STEMI  Repeat EKG: ED ECG REPORT I, Eula Listen, the attending physician, personally viewed and interpreted this ECG.   Date: 11/29/2017  EKG Time: 1359  Rate: 53  Rhythm: sinus bradycardia  Axis: normal  Intervals:first-degree A-V block   ST&T Change: No STEMI; 78mm ST depression I  ____________________________________________  RADIOLOGY  Dg Chest 2 View  Result Date: 11/29/2017 CLINICAL DATA:  Non smoker, increasing SOB this morning and feels like he's choking. Hx of multiple heart issues including recent stent placement, CHF and surgery at [facility] 2012 EXAM: CHEST - 2 VIEW COMPARISON:  10/30/2017 FINDINGS: Hyperinflation. Prior median sternotomy. Midline trachea. Normal heart size and mediastinal contours. Atherosclerosis in the transverse aorta. No pleural effusion or pneumothorax. Mild lower lobe predominant interstitial thickening. Mild right minor fissure thickening. IMPRESSION: No acute cardiopulmonary disease. Hyperinflation and interstitial thickening, most consistent with COPD/chronic bronchitis. Electronically Signed   By: Abigail Miyamoto M.D.   On: 11/29/2017 10:41     ____________________________________________   PROCEDURES  Procedure(s) performed: None  Procedures  Critical Care performed: Yes, see critical care note(s) ____________________________________________   INITIAL IMPRESSION / ASSESSMENT AND PLAN / ED COURSE  Pertinent labs & imaging results that were available during my care of the patient were reviewed by me and considered in my medical decision making (see chart for details).  62 y.o. male with recent stent placement after STEMI presenting for choking sensation.  Overall, the patient is a difficult historian and has some difficulty describing exactly what his abnormal sensations are.  There is no clinical evidence for an obstructive cause for his choking sensation although a mass in the esophagus cannot be excluded as I am unable to view it.  However, the patient is able to swallow without any difficulty.  The patient's airway is not impaired in any way and his oxygen saturations are normal both at rest and with ambulation.  The patient has not been having any chest pain or chest discomfort, but given his history of CAD, I would be concerned about coronary artery disease, ACS or MI.  His EKG does not show a STEMI; his troponin is elevated today although it is significantly lower than his troponin during his last visit.  We will need to trend this for further evaluation.  We also rule out infection although the patient's symptoms are less likely due to this.  The patient is bradycardic and it may be that his symptoms are due to symptomatic bradycardia.  We will also evaluate him for congestive heart failure given that his symptoms are most prominent with lying flat.  The patient is compliant with his CPAP, but he may be having some choking sensation and shortness of breath due to his enlarged neck and large body habitus.  I do not suspect aortic pathology or PE.  The patient is on Eliquis so additional antic regulation is not indicated  per  ----------------------------------------- 2:08 PM on 11/29/2017 ----------------------------------------- The patient's repeat troponin is  increased from his first; initially had a troponin of 0.15 and it is now 0.19.  He does not have any ischemic changes on his repeat EKG.  He continues to be chest pain-free and has reassuring vital signs.  At this time, we will plan to admit the patient to the hospital for further evaluation.  CRITICAL CARE Performed by: Eula Listen   Total critical care time: 35 minutes  Critical care time was exclusive of separately billable procedures and treating other patients.  Critical care was necessary to treat or prevent imminent or life-threatening deterioration.  Critical care was time spent personally by me on the following activities: development of treatment plan with patient and/or surrogate as well as nursing, discussions with consultants, evaluation of patient's response to treatment, examination of patient, obtaining history from patient or surrogate, ordering and performing treatments and interventions, ordering and review of laboratory studies, ordering and review of radiographic studies, pulse oximetry and re-evaluation of patient's condition. ____________________________________________  FINAL CLINICAL IMPRESSION(S) / ED DIAGNOSES  Final diagnoses:  NSTEMI (non-ST elevated myocardial infarction) [facility])  Choking, initial encounter  Orthopnea    Clinical Course as of Nov 29 1408  Sun Nov 29, 2017  8676 Basic metabolic panel(!) [KN]    Clinical Course User Index [KN] Craig Staggers      NEW MEDICATIONS STARTED DURING THIS VISIT:  New Prescriptions   No medications on file      Eula Listen, MD 11/29/17 1410    Eula Listen, MD 11/29/17 1414

## 2017-11-29 NOTE — ED Triage Notes (Signed)
Pt states last night began having feeling like he is choking. States he has a bad tooth that he is unsure if playing a part in it. Denies facial swelling. States he starts coughing and gets SOB when walking far distances. Able to speak in clear complete sentences. Denies having food stuck in throat. States throat pain, like a squeezing. States hasn't eaten anything this AM, states drank some water and it went down. Feels like someone is choking him when he swallows.

## 2017-11-29 NOTE — ED Notes (Signed)
Pt denied feeling SOB at this time, DC breathing treatment. Ambulatory to subwait. Uses restroom on the way.   States MI 2 weeks ago with stent placement. Takes eliquis. Sent a blue top to lab as well.

## 2017-11-29 NOTE — ED Notes (Signed)
Lab had called with troponin 0.15---per charge rn .   Patient is currently in [place].  Will take to room 2 when available.

## 2017-11-29 NOTE — Progress Notes (Signed)
CRITICAL VALUE ALERT  Critical Value:  Troponin 0.25  Date & Time Notied:  11/29/17.1735  Provider Notified: txt page Dr. Clayborn Bigness  Orders Received/Actions taken: n/a

## 2017-11-29 NOTE — Progress Notes (Signed)
Pt transferred from ED via wheelchair, A&O4, complains of headache since last night, sob, elevated troponin, received Tylenol in ED which patient says didn't help. Dr. Margaretmary Eddy notified will put orders in. Will continue to monitor.

## 2017-11-29 NOTE — ED Triage Notes (Signed)
Says since last night he feels like he is being choked whenever he tries to drink somethning.  Does not feel like it is going down wrong tube.

## 2017-11-29 NOTE — H&P (Signed)
[facility] NAME: Marcus Williams    MR#:  [id number]  DATE OF BIRTH:  [birth year]  DATE OF ADMISSION:  11/29/2017  PRIMARY CARE PHYSICIAN: Idelle Crouch, MD   REQUESTING/REFERRING PHYSICIAN: Mariea Clonts  CHIEF COMPLAINT:   sob HISTORY OF PRESENT ILLNESS:  Marcus Williams  is a 62 y.o. male with a known history of CAD, non-STEMI in May 2019 Cardiac catheterization revealed 100% proximal left circumflex stenosis, 100% proximal LAD stenosis, 70% proximal 60% mid RCA stenosis. He had a patent left internal mammary to the LAD, patent saphenous vein graft to the first diagonal, patent saphenous vein graft to OM 1, patent saphenous vein graft to the distal RCA with a high-grade 99% stenosis in the graft. He underwent placement of a CRI 2.75 x 15 mm drug-eluting stent in the graft. His ejection fraction was 44%, asthma requiring diabetes metas, obstructive sleep apnea on CPAP, hypertension, COPD, chronic atrial for ablation on Eliquis, and Plavix is presenting to the ED with a chief complaint of shortness of breath and headache.  Patient reports headache started from yesterday.  Denies any chest pain during my examination but reporting some sort of shortness of breath.  Patient wears CPAP machine during nighttime but he woke up in the middle of the night with shortness of breath.  Reporting some headache and Tylenol was given by the ED physician and CT head was ordered which is pending at this time.  Initial troponin at 0.15 and repeat troponin 0 0.19.  Patient reported some choking spells but swallow Tylenol with some water without any difficulty      PAST MEDICAL HISTORY:   Past Medical History:  Diagnosis Date  . A-fib ([place])   . Aortic stenosis, moderate 11/2015  . Arthritis   . CAD (coronary artery disease)   . Chronic systolic CHF (congestive heart failure) ([place])   . Diabetes mellitus without complication ([place])    INSULIN DEPENDENT  . GERD  (gastroesophageal reflux disease)   . History of cardioversion   . Hypertension   . MI (myocardial infarction) ([place])   . OSA on CPAP   . Sepsis ([place])   . Shortness of breath dyspnea   . Stroke ([place])   . SVT (supraventricular tachycardia) ([place])     PAST SURGICAL HISTOIRY:   Past Surgical History:  Procedure Laterality Date  . 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 BUNIONECTOMY    . CARDIAC CATHETERIZATION N/A 10/02/2015   Procedure: Coronary/Grafts Angiography;  Surgeon: Teodoro Spray, MD;  Location: [place] [facility];  Service: Cardiovascular;  Laterality: N/A;  . CARDIAC CATHETERIZATION N/A 11/21/2015   Procedure: Right and Left Heart Cath;  Surgeon: Sherren Mocha, MD;  Location: [place] [facility];  Service: Cardiovascular;  Laterality: N/A;  . CORONARY ARTERY BYPASS GRAFT    . CORONARY STENT INTERVENTION N/A 07/30/2017   Procedure: CORONARY STENT INTERVENTION;  Surgeon: Isaias Cowman, MD;  Location: [place] [facility];  Service: Cardiovascular;  Laterality: N/A;  . CORONARY STENT INTERVENTION N/A 10/23/2017   Procedure: CORONARY STENT INTERVENTION;  Surgeon: Yolonda Kida, MD;  Location: [place] [facility];  Service: Cardiovascular;  Laterality: N/A;  . KNEE SURGERY    . LEFT HEART CATH AND CORONARY ANGIOGRAPHY N/A 10/30/2017   Procedure: LEFT HEART CATH AND CORONARY ANGIOGRAPHY;  Surgeon: Teodoro Spray,  MD;  Location: [place] [facility];  Service: Cardiovascular;  Laterality: N/A;  . LEFT HEART CATH AND CORS/GRAFTS ANGIOGRAPHY N/A 10/22/2017   Procedure: LEFT HEART CATH AND CORS/GRAFTS ANGIOGRAPHY;  Surgeon: Corey Skains, MD;  Location: [place] [facility];  Service: Cardiovascular;  Laterality: N/A;  . quadruple bypass    . RIGHT/LEFT HEART CATH AND CORONARY/GRAFT ANGIOGRAPHY N/A 07/30/2017   Procedure: RIGHT/LEFT HEART CATH AND CORONARY/GRAFT  ANGIOGRAPHY;  Surgeon: Isaias Cowman, MD;  Location: [place] [facility];  Service: Cardiovascular;  Laterality: N/A;  . TEE WITHOUT CARDIOVERSION  11/21/2015  . TEE WITHOUT CARDIOVERSION N/A 11/21/2015   Procedure: TRANSESOPHAGEAL ECHOCARDIOGRAM (TEE);  Surgeon: Larey Dresser, MD;  Location: [facility];  Service: Cardiovascular;  Laterality: N/A;    SOCIAL HISTORY:   Social History   Tobacco Use  . Smoking status: Never Smoker  . Smokeless tobacco: Never Used  Substance Use Topics  . Alcohol use: No    FAMILY HISTORY:   Family History  Problem Relation Age of Onset  . Stroke Mother   . Heart attack Mother   . Hypertension Mother   . Heart attack Father   . Hypertension Father   . Heart attack Brother        #1  . Diabetes Brother        #1  . Heart disease Brother        #2  . Lung disease Brother        #2  . Hypertension Brother        #2  . Diabetes Brother        #2    DRUG ALLERGIES:   Allergies  Allergen Reactions  . Contrast Media [Iodinated Diagnostic Agents] Shortness Of Breath    Hypoxia/hypotension  . Versed [Midazolam] Other (See Comments)    Hypotension/hypoxia    REVIEW OF SYSTEMS:  CONSTITUTIONAL: No fever, fatigue or weakness.  EYES: No blurred or double vision.  EARS, NOSE, AND THROAT: No tinnitus or ear pain.  RESPIRATORY: No cough, reporting some shortness of breath, denies wheezing or hemoptysis.  CARDIOVASCULAR: No chest pain, orthopnea, edema.  GASTROINTESTINAL: No nausea, vomiting, diarrhea or abdominal pain.  GENITOURINARY: No dysuria, hematuria.  ENDOCRINE: No polyuria, nocturia,  HEMATOLOGY: No anemia, easy bruising or bleeding SKIN: No rash or lesion. MUSCULOSKELETAL: No joint pain or arthritis.   NEUROLOGIC: No tingling, numbness, weakness.  Reporting headache PSYCHIATRY: No anxiety or depression.   MEDICATIONS AT HOME:   Prior to Admission medications   Medication Sig Start Date End Date Taking? Authorizing  Provider  albuterol (PROVENTIL HFA;VENTOLIN HFA) 108 (90 Base) MCG/ACT inhaler Inhale 2 puffs into the lungs every 6 (six) hours as needed for wheezing or shortness of breath. 07/11/15  Yes Kasa, Maretta Bees, MD  apixaban (ELIQUIS) 5 MG TABS tablet Take 5 mg by mouth 2 (two) times daily.   Yes [provider]  atorvastatin (LIPITOR) 40 MG tablet Take 1 tablet (40 mg total) by mouth daily at 6 PM. 10/24/17  Yes Jamerion Cabello, MD  baclofen (LIORESAL) 10 MG tablet Take 10 mg by mouth 2 (two) times daily as needed for muscle spasms.    Yes [provider]  clopidogrel (PLAVIX) 75 MG tablet Take 1 tablet (75 mg total) by mouth daily with breakfast. 08/01/17  Yes Max Sane, MD  diltiazem (CARDIZEM CD) 180 MG 24 hr capsule Take 180 mg by mouth 2 (two) times daily.   Yes [provider]  DULoxetine (CYMBALTA) 20  MG capsule Take 20 mg by mouth daily.   Yes [provider]  Fluticasone-Salmeterol (ADVAIR) 250-50 MCG/DOSE AEPB Inhale 1 puff into the lungs 2 (two) times daily.   Yes [provider]  insulin NPH-regular Human (NOVOLIN 70/30) (70-30) 100 UNIT/ML injection Inject 85-100 Units into the skin 2 (two) times daily with a meal. 85 units in the morning and 100 units in the evening   Yes [provider]  lisinopril (PRINIVIL,ZESTRIL) 10 MG tablet Take 1 tablet (10 mg total) by mouth daily. 08/14/17  Yes Gladstone Lighter, MD  metFORMIN (GLUCOPHAGE) 1000 MG tablet Take 1,000 mg by mouth 2 (two) times daily with a meal.   Yes [provider]  metoprolol (LOPRESSOR) 100 MG tablet Take 100 mg by mouth 2 (two) times daily. 10/15/15  Yes [provider]  nitroGLYCERIN (NITROSTAT) 0.4 MG SL tablet Place 0.4 mg under the tongue every 5 (five) minutes as needed for chest pain.   Yes [provider]  pantoprazole (PROTONIX) 40 MG tablet Take 40 mg by mouth daily.   Yes [provider]  potassium chloride 20 MEQ TBCR Take 20 mEq by mouth  daily. 11/23/15  Yes Shirley Friar, PA-C  pramipexole (MIRAPEX) 0.5 MG tablet Take 0.5 mg by mouth 3 (three) times daily as needed.    Yes [provider]  torsemide (DEMADEX) 20 MG tablet Take 2 tablets (40 mg total) by mouth daily. 08/14/17  Yes Gladstone Lighter, MD  zolpidem (AMBIEN) 5 MG tablet Take 1 tablet (5 mg total) by mouth at bedtime as needed for sleep. 11/01/17  Yes Fritzi Mandes, MD      VITAL SIGNS:  Blood pressure 120/70, pulse (!) 55, temperature 98.2 F (36.8 C), temperature source Oral, resp. rate 18, height 5\' 7"  (1.702 m), weight 118.8 kg (262 lb), SpO2 98 %.  PHYSICAL EXAMINATION:  GENERAL:  62 y.o.-year-old patient lying in the bed with no acute distress.  Morbidly obese EYES: Pupils equal, round, reactive to light and accommodation. No scleral icterus. Extraocular muscles intact.  HEENT: Head atraumatic, normocephalic. Oropharynx and nasopharynx clear.  NECK:  Supple, no jugular venous distention. No thyroid enlargement, no tenderness.  LUNGS: Normal breath sounds bilaterally, no wheezing, rales,rhonchi or crepitation. No use of accessory muscles of respiration.  CARDIOVASCULAR: S1, S2 normal. No murmurs, rubs, or gallops.  ABDOMEN: Soft, nontender, nondistended. Bowel sounds present.   EXTREMITIES: No pedal edema, cyanosis, or clubbing.  NEUROLOGIC: Cranial nerves II through XII are intact. Muscle strength 5/5 in all extremities. Sensation intact. Gait not checked.  PSYCHIATRIC: The patient is alert and oriented x 3.  SKIN: No obvious rash, lesion, or ulcer.   LABORATORY PANEL:   CBC Recent Labs  Lab 11/29/17 1021  WBC 6.5  HGB 12.2*  HCT 35.3*  PLT 145*   ------------------------------------------------------------------------------------------------------------------  Chemistries  Recent Labs  Lab 11/29/17 1021  NA 137  K 4.0  CL 104  CO2 23  GLUCOSE 179*  BUN 24*  CREATININE 0.83  CALCIUM 7.8*  AST 29  ALT 22  ALKPHOS 46   BILITOT 0.7   ------------------------------------------------------------------------------------------------------------------  Cardiac Enzymes Recent Labs  Lab 11/29/17 1228  TROPONINI 0.19*   ------------------------------------------------------------------------------------------------------------------  RADIOLOGY:  Dg Chest 2 View  Result Date: 11/29/2017 CLINICAL DATA:  Non smoker, increasing SOB this morning and feels like he's choking. Hx of multiple heart issues including recent stent placement, CHF and surgery at [facility] 2012 EXAM: CHEST - 2 VIEW COMPARISON:  10/30/2017 FINDINGS: Hyperinflation. Prior  median sternotomy. Midline trachea. Normal heart size and mediastinal contours. Atherosclerosis in the transverse aorta. No pleural effusion or pneumothorax. Mild lower lobe predominant interstitial thickening. Mild right minor fissure thickening. IMPRESSION: No acute cardiopulmonary disease. Hyperinflation and interstitial thickening, most consistent with COPD/chronic bronchitis. Electronically Signed   By: Abigail Miyamoto M.D.   On: 11/29/2017 10:41    EKG:   Orders placed or performed during the hospital encounter of 11/29/17  . ED EKG  . ED EKG  . ED EKG  . ED EKG  . EKG 12-Lead  . EKG 12-Lead    IMPRESSION AND PLAN:   Marcus Williams  is a 62 y.o. male with a known history of CAD, non-STEMI in May 2019 Cardiac catheterization revealed 100% proximal left circumflex stenosis, 100% proximal LAD stenosis, 70% proximal 60% mid RCA stenosis. He had a patent left internal mammary to the LAD, patent saphenous vein graft to the first diagonal, patent saphenous vein graft to OM 1, patent saphenous vein graft to the distal RCA with a high-grade 99% stenosis in the graft. He underwent placement of a CRI 2.75 x 15 mm drug-eluting stent in the graft. His ejection fraction was 44%, asthma requiring diabetes metas, obstructive sleep apnea on CPAP, hypertension, COPD, chronic atrial for ablation  on Eliquis, and Plavix is presenting to the ED with a chief complaint of shortness of breath and headache.  Patient reports headache started from yesterday.  Denies any chest pain   #Shortness of breath with elevated troponin Admit to telemetry Initial troponin is at 0.1 5 repeat 0.19 Patient denies any chest pain Continue home medication Eliquis, Plavix, metoprolol, lisinopril and Lipitor as recommended by on-call cardiology Dr. call wood.  Patient is sees Dr. Ubaldo Glassing as an outpatient  recent cardiac cath has revealed ejection fraction 44% Oxygen, nitroglycerin as needed, morphine as needed [facility] cardiology consulted  #Headache Tylenol was given in the ED. CT head is ordered by the ED physician which is pending at this time  #Obstructive sleep apnea continue CPAP nightly  #Essential hypertension continue home medication lisinopril, metoprolol   #chronic atrial fibrillation rate controlled continue Cardizem and Eliquis  #Insulin requiring diabetes mellitus continue Novolin 70/30 85 units twice a day and hold metformin Sliding scale insulin   All the records are reviewed and case discussed with ED provider. Management plans discussed with the patient, family and they are in agreement.  CODE STATUS: FC ,WIFE and daughter HCPOA  TOTAL TIME TAKING CARE OF THIS PATIENT: 42 minutes.   Note: This dictation was prepared with Dragon dictation along with smaller phrase technology. Any transcriptional errors that result from this process are unintentional.  [name] M.D on 11/29/2017 at 2:43 PM  Between 7am to 6pm - Pager - [phone]  After 6pm go to [email or web address] - password [email or web address] [facility]  Office  [phone]  CC: Primary care physician; Idelle Crouch, MD

## 2017-11-30 DIAGNOSIS — I214 Non-ST elevation (NSTEMI) myocardial infarction: Secondary | ICD-10-CM | POA: Diagnosis not present

## 2017-11-30 DIAGNOSIS — M1990 Unspecified osteoarthritis, unspecified site: Secondary | ICD-10-CM | POA: Diagnosis present

## 2017-11-30 DIAGNOSIS — I5023 Acute on chronic systolic (congestive) heart failure: Secondary | ICD-10-CM | POA: Diagnosis not present

## 2017-11-30 DIAGNOSIS — Z9989 Dependence on other enabling machines and devices: Secondary | ICD-10-CM | POA: Diagnosis not present

## 2017-11-30 DIAGNOSIS — I25798 Atherosclerosis of other coronary artery bypass graft(s) with other forms of angina pectoris: Secondary | ICD-10-CM | POA: Diagnosis not present

## 2017-11-30 DIAGNOSIS — R7989 Other specified abnormal findings of blood chemistry: Secondary | ICD-10-CM | POA: Diagnosis not present

## 2017-11-30 DIAGNOSIS — K219 Gastro-esophageal reflux disease without esophagitis: Secondary | ICD-10-CM | POA: Diagnosis present

## 2017-11-30 DIAGNOSIS — I25110 Atherosclerotic heart disease of native coronary artery with unstable angina pectoris: Secondary | ICD-10-CM | POA: Diagnosis not present

## 2017-11-30 DIAGNOSIS — Z955 Presence of coronary angioplasty implant and graft: Secondary | ICD-10-CM | POA: Diagnosis not present

## 2017-11-30 DIAGNOSIS — G4733 Obstructive sleep apnea (adult) (pediatric): Secondary | ICD-10-CM | POA: Diagnosis not present

## 2017-11-30 DIAGNOSIS — R0602 Shortness of breath: Secondary | ICD-10-CM | POA: Diagnosis not present

## 2017-11-30 DIAGNOSIS — I213 ST elevation (STEMI) myocardial infarction of unspecified site: Secondary | ICD-10-CM | POA: Diagnosis not present

## 2017-11-30 DIAGNOSIS — Z7901 Long term (current) use of anticoagulants: Secondary | ICD-10-CM | POA: Diagnosis not present

## 2017-11-30 DIAGNOSIS — I2510 Atherosclerotic heart disease of native coronary artery without angina pectoris: Secondary | ICD-10-CM | POA: Diagnosis not present

## 2017-11-30 DIAGNOSIS — I200 Unstable angina: Secondary | ICD-10-CM | POA: Diagnosis not present

## 2017-11-30 DIAGNOSIS — Z8673 Personal history of transient ischemic attack (TIA), and cerebral infarction without residual deficits: Secondary | ICD-10-CM | POA: Diagnosis not present

## 2017-11-30 DIAGNOSIS — Z951 Presence of aortocoronary bypass graft: Secondary | ICD-10-CM | POA: Diagnosis not present

## 2017-11-30 DIAGNOSIS — Z794 Long term (current) use of insulin: Secondary | ICD-10-CM | POA: Diagnosis not present

## 2017-11-30 DIAGNOSIS — I249 Acute ischemic heart disease, unspecified: Secondary | ICD-10-CM | POA: Diagnosis not present

## 2017-11-30 DIAGNOSIS — I480 Paroxysmal atrial fibrillation: Secondary | ICD-10-CM | POA: Diagnosis present

## 2017-11-30 DIAGNOSIS — I10 Essential (primary) hypertension: Secondary | ICD-10-CM | POA: Diagnosis not present

## 2017-11-30 DIAGNOSIS — R748 Abnormal levels of other serum enzymes: Secondary | ICD-10-CM | POA: Diagnosis not present

## 2017-11-30 DIAGNOSIS — Z89421 Acquired absence of other right toe(s): Secondary | ICD-10-CM | POA: Diagnosis not present

## 2017-11-30 DIAGNOSIS — I110 Hypertensive heart disease with heart failure: Secondary | ICD-10-CM | POA: Diagnosis not present

## 2017-11-30 DIAGNOSIS — I482 Chronic atrial fibrillation: Secondary | ICD-10-CM | POA: Diagnosis present

## 2017-11-30 DIAGNOSIS — Z7902 Long term (current) use of antithrombotics/antiplatelets: Secondary | ICD-10-CM | POA: Diagnosis not present

## 2017-11-30 DIAGNOSIS — J449 Chronic obstructive pulmonary disease, unspecified: Secondary | ICD-10-CM | POA: Diagnosis not present

## 2017-11-30 DIAGNOSIS — I252 Old myocardial infarction: Secondary | ICD-10-CM | POA: Diagnosis not present

## 2017-11-30 DIAGNOSIS — I350 Nonrheumatic aortic (valve) stenosis: Secondary | ICD-10-CM | POA: Diagnosis present

## 2017-11-30 DIAGNOSIS — Z79899 Other long term (current) drug therapy: Secondary | ICD-10-CM | POA: Diagnosis not present

## 2017-11-30 DIAGNOSIS — E119 Type 2 diabetes mellitus without complications: Secondary | ICD-10-CM | POA: Diagnosis not present

## 2017-11-30 LAB — GLUCOSE, CAPILLARY
Glucose-Capillary: 180 mg/dL — ABNORMAL HIGH (ref 65–99)
Glucose-Capillary: 159 mg/dL — ABNORMAL HIGH (ref 65–99)

## 2017-11-30 MED ORDER — FLUTICASONE-SALMETEROL 250-50 MCG/DOSE IN AEPB: 1.0000 | INHALATION_SPRAY | Freq: Two times a day (BID) | RESPIRATORY_TRACT | 1 refills | Status: AC

## 2017-11-30 MED ORDER — SODIUM CHLORIDE 0.9% FLUSH
3.0000 mL | Freq: Two times a day (BID) | INTRAVENOUS | Status: DC
  Administered 2017-11-30: 3 mL via INTRAVENOUS

## 2017-11-30 NOTE — Evaluation (Signed)
Clinical/Bedside Swallow Evaluation Patient Details  Name: COURAGE BIGLOW MRN: [id number] Date of Birth: 1956/02/16  Today's Date: 11/30/2017 Time: SLP Start Time (ACUTE ONLY): 1500 SLP Stop Time (ACUTE ONLY): 1535 SLP Time Calculation (min) (ACUTE ONLY): 35 min  Past Medical History:  Past Medical History:  Diagnosis Date  . A-fib ([place])   . Aortic stenosis, moderate 11/2015  . Arthritis   . CAD (coronary artery disease)   . Chronic systolic CHF (congestive heart failure) ([place])   . Diabetes mellitus without complication ([place])    INSULIN DEPENDENT  . GERD (gastroesophageal reflux disease)   . History of cardioversion   . Hypertension   . MI (myocardial infarction) ([place])   . OSA on CPAP   . Sepsis ([place])   . Shortness of breath dyspnea   . Stroke ([place])   . SVT (supraventricular tachycardia) ([facility])    Past Surgical History:  Past Surgical History:  Procedure Laterality Date  . 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 BUNIONECTOMY    . CARDIAC CATHETERIZATION N/A 10/02/2015   Procedure: Coronary/Grafts Angiography;  Surgeon: Teodoro Spray, MD;  Location: [place] [facility];  Service: Cardiovascular;  Laterality: N/A;  . CARDIAC CATHETERIZATION N/A 11/21/2015   Procedure: Right and Left Heart Cath;  Surgeon: Sherren Mocha, MD;  Location: [place] [facility];  Service: Cardiovascular;  Laterality: N/A;  . CORONARY ARTERY BYPASS GRAFT    . CORONARY STENT INTERVENTION N/A 07/30/2017   Procedure: CORONARY STENT INTERVENTION;  Surgeon: Isaias Cowman, MD;  Location: [place] [facility];  Service: Cardiovascular;  Laterality: N/A;  . CORONARY STENT INTERVENTION N/A 10/23/2017   Procedure: CORONARY STENT INTERVENTION;  Surgeon: Yolonda Kida, MD;  Location: [place] [facility];  Service: Cardiovascular;  Laterality: N/A;  . KNEE SURGERY    . LEFT HEART CATH AND CORONARY  ANGIOGRAPHY N/A 10/30/2017   Procedure: LEFT HEART CATH AND CORONARY ANGIOGRAPHY;  Surgeon: Teodoro Spray, MD;  Location: [place] [facility];  Service: Cardiovascular;  Laterality: N/A;  . LEFT HEART CATH AND CORS/GRAFTS ANGIOGRAPHY N/A 10/22/2017   Procedure: LEFT HEART CATH AND CORS/GRAFTS ANGIOGRAPHY;  Surgeon: Corey Skains, MD;  Location: [place] [facility];  Service: Cardiovascular;  Laterality: N/A;  . quadruple bypass    . RIGHT/LEFT HEART CATH AND CORONARY/GRAFT ANGIOGRAPHY N/A 07/30/2017   Procedure: RIGHT/LEFT HEART CATH AND CORONARY/GRAFT ANGIOGRAPHY;  Surgeon: Isaias Cowman, MD;  Location: [place] [facility];  Service: Cardiovascular;  Laterality: N/A;  . TEE WITHOUT CARDIOVERSION  11/21/2015  . TEE WITHOUT CARDIOVERSION N/A 11/21/2015   Procedure: TRANSESOPHAGEAL ECHOCARDIOGRAM (TEE);  Surgeon: Larey Dresser, MD;  Location: [facility];  Service: Cardiovascular;  Laterality: N/A;   HPI:  Pt is a 61 year old obese male with past medical history significant for GERD, DM, CHF, HTN, CAD with occlusive disease status post CABG, Obesity, COPD on CPAP oxygen, chronic A. fib on Eliquis, OSA presents to hospital secondary to shortness of breath. Pt reports feeling "fine" when upright and walking around but feels like something is "choking in his throat" when he lays flat; redundant tissues in his oropharynx area per MD note. Pt has reported NO trouble swallowing to both NSG and MD during this admission. He is on a Regular diet currently and just finished lunch.    Assessment / Plan / Recommendation Clinical Impression  Pt appears to present w/  adequate Oropharyngeal phase swallowing function w/ reduced risk for aspiration. However, pt does have REFLUX/GERD and a HIATAL HERNIA baseline and describes MULTIPLE signs and symptoms of RELUX "coming back up to choke me". He further describes it feels like "it" is putting pressure on his chest during the night when he is sleeping and it  causes him to start coughing "sometimes". He acknowledged when he Sleeps more UPRIGHT, it is "much better". Pt has been consuming full meals and swallowing pills w/ water w/ NSG during this admission w/ no overt s/s of aspiration noted. Pt and NSG (and MD) all deny any difficulty swallowing and eating at meals; he has not changed his eating habits or diet d/t this issue.  Thorough discussion was had w/ pt about Reflux precautions and the impact of GERD and Hiatal Hernia and Esophageal dysmotility on his breathing/Pulmonary system if Reflux is aspirated. Handouts given on Reflux and precautions; diet(foods) recommendations. Recommended pt f/u w/ GI for better management of his GERD and Esophageal dysmotility; further education on such as well.  SLP Visit Diagnosis: Dysphagia, unspecified (R13.10)(Esophageal phase dysmotility - GERD, hiatal hernia baseline)    Aspiration Risk  (reduced from an oropharyngeal phase standpoint)    Diet Recommendation  Regular diet w/ thin liquids; STRICT REFLUX PRECAUTIONS, general aspiration precautions  Medication Administration: Whole meds with liquid(as tolerates)    Other  Recommendations Recommended Consults: Consider GI evaluation;Consider esophageal assessment(management) Oral Care Recommendations: Oral care BID;Patient independent with oral care Other Recommendations: (n/a)   Follow up Recommendations None      Frequency and Duration (n/a)  (n/a)       Prognosis Prognosis for Safe Diet Advancement: Good Barriers to Reach Goals: (baseline Hiatal Hernia; GERD)      Swallow Study   General Date of Onset: 11/29/17 HPI: Pt is a 62 year old obese male with past medical history significant for GERD, DM, CHF, HTN, CAD with occlusive disease status post CABG, Obesity, COPD on CPAP oxygen, chronic A. fib on Eliquis, OSA presents to hospital secondary to shortness of breath. Pt reports feeling "fine" when upright and walking around but feels like something is  "choking in his throat" when he lays flat; redundant tissues in his oropharynx area per MD note. Pt has reported NO trouble swallowing to both NSG and MD during this admission. He is on a Regular diet currently and just finished lunch.  Type of Study: Bedside Swallow Evaluation Previous Swallow Assessment: none reported Diet Prior to this Study: Regular;Thin liquids(has been tolerating this well per NSG) Temperature Spikes Noted: No(wbc NOT elevated) Respiratory Status: Room air History of Recent Intubation: No Behavior/Cognition: Alert;Cooperative;Pleasant mood Oral Cavity Assessment: Within Functional Limits(appeared) Oral Care Completed by SLP: No Oral Cavity - Dentition: Adequate natural dentition Vision: Functional for self-feeding(per pt) Self-Feeding Abilities: Able to feed self Patient Positioning: Upright in chair Baseline Vocal Quality: Normal Volitional Cough: (n/a) Volitional Swallow: Able to elicit    Oral/Motor/Sensory Function Overall Oral Motor/Sensory Function: Within functional limits(appeared WFL w/ his speech)   Ice Chips Ice chips: Not tested   Thin Liquid Thin Liquid: Within functional limits Presentation: Self Fed;Straw(per NSG report during medications)    Nectar Thick Nectar Thick Liquid: Not tested   Honey Thick Honey Thick Liquid: Not tested   Puree Puree: Not tested   Solid   GO   Solid: Not tested Other Comments: pt and NSG denied any deficits w/ his lunch meal         Orinda Kenner, MS, CCC-SLP [employer]  11/30/2017,4:12 PM

## 2017-11-30 NOTE — Discharge Summary (Signed)
[facility] NAME: Marcus Williams    MR#:  [id number]  DATE OF BIRTH:  1955-09-06  DATE OF ADMISSION:  11/29/2017   ADMITTING PHYSICIAN: [name], MD  DATE OF DISCHARGE:  11/30/17  PRIMARY CARE PHYSICIAN: Idelle Crouch, MD   ADMISSION DIAGNOSIS:   Orthopnea [R06.01] NSTEMI (non-ST elevated myocardial infarction) ([facility]) [I21.4] Choking, initial encounter [T17.308A]  DISCHARGE DIAGNOSIS:   Active Problems:   Elevated troponin   SECONDARY DIAGNOSIS:   Past Medical History:  Diagnosis Date  . A-fib ([place])   . Aortic stenosis, moderate 11/2015  . Arthritis   . CAD (coronary artery disease)   . Chronic systolic CHF (congestive heart failure) ([place])   . Diabetes mellitus without complication ([place])    INSULIN DEPENDENT  . GERD (gastroesophageal reflux disease)   . History of cardioversion   . Hypertension   . MI (myocardial infarction) ([place])   . OSA on CPAP   . Sepsis ([place])   . Shortness of breath dyspnea   . Stroke ([place])   . SVT (supraventricular tachycardia) [facility])     HOSPITAL COURSE:   62 year old obese male with past medical history significant for CAD with occlusive disease status post CABG, OSA, COPD oxygen, chronic A. fib on Eliquis presents to hospital secondary to shortness of breath.  1.  Shortness of breath-likely could be secondary to anxiety.  Also not sure if patient is using his CPAP with compliance. -Feels like his breathing is fine when he is walking around, feels like something is choking in his throat when he lays flat.  Redundant tissues in his oropharynx area. -We will get ENT consult as outpatient.  No trouble swallowing  2. Elevated troponin- elevated, but stable now -Appreciate cardiology consult.  Patient denies any chest pain. -Recommend continue cardiac medications at home. -Patient on Plavix, Eliquis, metoprolol, lisinopril and statin -Recent cath with PCI.  3.  Chronic A. fib-rate  controlled.  On Cardizem. -Continue Eliquis  4.  Insulin-dependent diabetes mellitus-continue home medications -Done NPH insulin, metformin  5.  COPD and sleep apnea-counseled about using the CPAP consistently. - advair bid, advised to use his nebs prn for dyspnea and monitor  Ambulating well- will be discharged today  DISCHARGE CONDITIONS:   Guarded  CONSULTS OBTAINED:   Treatment Team:  Yolonda Kida, MD  DRUG ALLERGIES:   Allergies  Allergen Reactions  . Contrast Media [Iodinated Diagnostic Agents] Shortness Of Breath    Hypoxia/hypotension  . Versed [Midazolam] Other (See Comments)    Hypotension/hypoxia   DISCHARGE MEDICATIONS:   Allergies as of 11/30/2017      Reactions   Contrast Media [iodinated Diagnostic Agents] Shortness Of Breath   Hypoxia/hypotension   Versed [midazolam] Other (See Comments)   Hypotension/hypoxia      Medication List    TAKE these medications   albuterol 108 (90 Base) MCG/ACT inhaler Commonly known as:  PROVENTIL HFA;VENTOLIN HFA Inhale 2 puffs into the lungs every 6 (six) hours as needed for wheezing or shortness of breath.   apixaban 5 MG Tabs tablet Commonly known as:  ELIQUIS Take 5 mg by mouth 2 (two) times daily.   atorvastatin 40 MG tablet Commonly known as:  LIPITOR Take 1 tablet (40 mg total) by mouth daily at 6 PM.   baclofen 10 MG tablet Commonly known as:  LIORESAL Take 10 mg by mouth 2 (two) times daily as needed for muscle spasms.   clopidogrel 75 MG  tablet Commonly known as:  PLAVIX Take 1 tablet (75 mg total) by mouth daily with breakfast.   diltiazem 180 MG 24 hr capsule Commonly known as:  CARDIZEM CD Take 180 mg by mouth 2 (two) times daily.   DULoxetine 20 MG capsule Commonly known as:  CYMBALTA Take 20 mg by mouth daily.   Fluticasone-Salmeterol 250-50 MCG/DOSE Aepb Commonly known as:  ADVAIR Inhale 1 puff into the lungs 2 (two) times daily. What changed:  when to take this   insulin  NPH-regular Human (70-30) 100 UNIT/ML injection Commonly known as:  NOVOLIN 70/30 Inject 85-100 Units into the skin 2 (two) times daily with a meal. 85 units in the morning and 100 units in the evening   lisinopril 10 MG tablet Commonly known as:  PRINIVIL,ZESTRIL Take 1 tablet (10 mg total) by mouth daily.   metFORMIN 1000 MG tablet Commonly known as:  GLUCOPHAGE Take 1,000 mg by mouth 2 (two) times daily with a meal.   metoprolol tartrate 100 MG tablet Commonly known as:  LOPRESSOR Take 100 mg by mouth 2 (two) times daily.   nitroGLYCERIN 0.4 MG SL tablet Commonly known as:  NITROSTAT Place 0.4 mg under the tongue every 5 (five) minutes as needed for chest pain.   pantoprazole 40 MG tablet Commonly known as:  PROTONIX Take 40 mg by mouth daily.   Potassium Chloride ER 20 MEQ Tbcr Take 20 mEq by mouth daily.   pramipexole 0.5 MG tablet Commonly known as:  MIRAPEX Take 0.5 mg by mouth 3 (three) times daily as needed.   torsemide 20 MG tablet Commonly known as:  DEMADEX Take 2 tablets (40 mg total) by mouth daily.   zolpidem 5 MG tablet Commonly known as:  AMBIEN Take 1 tablet (5 mg total) by mouth at bedtime as needed for sleep.        DISCHARGE INSTRUCTIONS:   1. PCP f/u in 1-2 weeks 2. ENT f/u in 1 week  DIET:   Cardiac diet  ACTIVITY:   Activity as tolerated  OXYGEN:   Home Oxygen: No.  Oxygen Delivery: room air  DISCHARGE LOCATION:   home   If you experience worsening of your admission symptoms, develop shortness of breath, life threatening emergency, suicidal or homicidal thoughts you must seek medical attention immediately by calling 911 or calling your MD immediately  if symptoms less severe.  You Must read complete instructions/literature along with all the possible adverse reactions/side effects for all the Medicines you take and that have been prescribed to you. Take any new Medicines after you have completely understood and accpet all the  possible adverse reactions/side effects.   Please note  You were cared for by a hospitalist during your hospital stay. If you have any questions about your discharge medications or the care you received while you were in the hospital after you are discharged, you can call the unit and asked to speak with the hospitalist on call if the hospitalist that took care of you is not available. Once you are discharged, your primary care physician will handle any further medical issues. Please note that NO REFILLS for any discharge medications will be authorized once you are discharged, as it is imperative that you return to your primary care physician (or establish a relationship with a primary care physician if you do not have one) for your aftercare needs so that they can reassess your need for medications and monitor your lab values.    On the day of  Discharge:  VITAL SIGNS:   Blood pressure 136/67, pulse (!) 53, temperature 97.8 F (36.6 C), temperature source Oral, resp. rate 18, height 5\' 7"  (1.702 m), weight 124.3 kg (274 lb), SpO2 98 %.  PHYSICAL EXAMINATION:    GENERAL:  62 y.o.-year-old obese patient lying in the bed with no acute distress.  EYES: Pupils equal, round, reactive to light and accommodation. No scleral icterus. Extraocular muscles intact.  HEENT: Head atraumatic, normocephalic. Oropharynx and nasopharynx clear. Thick and redundant base of the tongue  NECK:  Supple, no jugular venous distention. No thyroid enlargement, no tenderness.  LUNGS: Normal breath sounds bilaterally, no wheezing, rales,rhonchi or crepitation. No use of accessory muscles of respiration.  CARDIOVASCULAR: S1, S2 normal. No murmurs, rubs, or gallops.  ABDOMEN: Soft, non-tender, non-distended. Bowel sounds present. No organomegaly or mass.  EXTREMITIES: No pedal edema, cyanosis, or clubbing.  NEUROLOGIC: Cranial nerves II through XII are intact. Muscle strength 5/5 in all extremities. Sensation intact. Gait  not checked.  PSYCHIATRIC: The patient is alert and oriented x 3.  SKIN: No obvious rash, lesion, or ulcer.   DATA REVIEW:   CBC Recent Labs  Lab 11/29/17 1021  WBC 6.5  HGB 12.2*  HCT 35.3*  PLT 145*    Chemistries  Recent Labs  Lab 11/29/17 1021  NA 137  K 4.0  CL 104  CO2 23  GLUCOSE 179*  BUN 24*  CREATININE 0.83  CALCIUM 7.8*  AST 29  ALT 22  ALKPHOS 54  BILITOT 0.7     Microbiology Results  Results for orders placed or performed during the hospital encounter of 10/28/17  MRSA PCR Screening     Status: None   Collection Time: 10/30/17  2:39 PM  Result Value Ref Range Status   MRSA by PCR NEGATIVE NEGATIVE Final    Comment:        The GeneXpert MRSA Assay (FDA approved for NASAL specimens only), is one component of a comprehensive MRSA colonization surveillance program. It is not intended to diagnose MRSA infection nor to guide or monitor treatment for MRSA infections. Performed at [facility], [address], [place], [place] [address]     RADIOLOGY:  No results found.   Management plans discussed with the patient, family and they are in agreement.  CODE STATUS:     Code Status Orders  (From admission, onward)        Start     Ordered   11/29/17 1421  Full code  Continuous     11/29/17 1422    Code Status History    Date Active Date Inactive Code Status Order ID Comments User Context   10/28/2017 1958 11/01/2017 1658 Full Code [id number]  Gorden Harms, MD ED   10/22/2017 0551 10/24/2017 1801 Full Code [id number]  Arta Silence, MD ED   08/10/2017 2205 08/14/2017 1512 Full Code [id number]  Vaughan Basta, MD Inpatient   07/31/2017 2045 08/02/2017 1648 Full Code [id number]  Henreitta Leber, MD Inpatient   07/28/2017 2230 07/31/2017 1428 Full Code [id number]  Dustin Flock, MD Inpatient   03/01/2017 0203 03/01/2017 2004 Full Code [id number]  [place], Time, DO Inpatient   11/21/2015 1620 11/23/2015 1710 Full Code  [id number]  Sherren Mocha, MD Inpatient   05/06/2015 1545 [id number] 1609 Full Code [id number]  Sharlotte Alamo, MD Inpatient   05/04/2015 1951 05/06/2015 1545 Full Code [id number]  Vaughan Basta, MD ED   01/14/2015 1416 01/15/2015 1514 Full Code [id number]  Lance Coon, MD Inpatient  12/22/2014 1825 12/23/2014 1612 Full Code [id number]  Epifanio Lesches, MD ED   10/14/2014 1214 10/15/2014 1350 Full Code [id number]  Alfonso Patten, RN Inpatient      TOTAL TIME TAKING CARE OF THIS PATIENT: 38  minutes.    Gladstone Lighter M.D on 11/30/2017 at 2:52 PM  Between 7am to 6pm - Pager - [phone]  After 6pm go to [email or web address] - Proofreader  [facility]  Office  [phone]  CC: Primary care physician; Idelle Crouch, MD   Note: This dictation was prepared with Dragon dictation along with smaller phrase technology. Any transcriptional errors that result from this process are unintentional.

## 2017-11-30 NOTE — Consult Note (Signed)
Reason for Consult: Congestion shortness of breath borderline troponin Referring Physician: Dr. Margaretmary Eddy hospitalist Georgie Chard primary Cardiologist Dr. Nigel Berthold Marcus Williams is an 62 y.o. male.  HPI: Patient 62 year old male known multivessel coronary disease previous coronary bypass surgery recent PCI and stent in May 2019 with DES.  Patient has had obstructive sleep apnea diabetes hypertension COPD atrial fibrillation with significant shortness of breath.  Patient complains of choking sensation has difficulty lying flat denies any swelling no significant chest pain.  Patient feels like there is someone choking him around his neck he has no trouble swallowing no wheezing no cough patient has no trouble with ambulation no dyspnea on exertion symptoms are worse just with lying down.  Patient was seen in emergency room and then admitted for further assessment evaluation  Past Medical History:  Diagnosis Date  . A-fib ([place])   . Aortic stenosis, moderate 11/2015  . Arthritis   . CAD (coronary artery disease)   . Chronic systolic CHF (congestive heart failure) ([place])   . Diabetes mellitus without complication ([place])    INSULIN DEPENDENT  . GERD (gastroesophageal reflux disease)   . History of cardioversion   . Hypertension   . MI (myocardial infarction) ([place])   . OSA on CPAP   . Sepsis ([place])   . Shortness of breath dyspnea   . Stroke ([place])   . SVT (supraventricular tachycardia) ([facility])     Past Surgical History:  Procedure Laterality Date  . 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 BUNIONECTOMY    . CARDIAC CATHETERIZATION N/A 10/02/2015   Procedure: Coronary/Grafts Angiography;  Surgeon: Teodoro Spray, MD;  Location: [facility];  Service: Cardiovascular;  Laterality: N/A;  . CARDIAC CATHETERIZATION N/A 11/21/2015   Procedure: Right and Left Heart Cath;  Surgeon: Sherren Mocha,  MD;  Location: [facility];  Service: Cardiovascular;  Laterality: N/A;  . CORONARY ARTERY BYPASS GRAFT    . CORONARY STENT INTERVENTION N/A 07/30/2017   Procedure: CORONARY STENT INTERVENTION;  Surgeon: Isaias Cowman, MD;  Location: [facility];  Service: Cardiovascular;  Laterality: N/A;  . CORONARY STENT INTERVENTION N/A 10/23/2017   Procedure: CORONARY STENT INTERVENTION;  Surgeon: Yolonda Kida, MD;  Location: [facility];  Service: Cardiovascular;  Laterality: N/A;  . KNEE SURGERY    . LEFT HEART CATH AND CORONARY ANGIOGRAPHY N/A 10/30/2017   Procedure: LEFT HEART CATH AND CORONARY ANGIOGRAPHY;  Surgeon: Teodoro Spray, MD;  Location: [facility];  Service: Cardiovascular;  Laterality: N/A;  . LEFT HEART CATH AND CORS/GRAFTS ANGIOGRAPHY N/A 10/22/2017   Procedure: LEFT HEART CATH AND CORS/GRAFTS ANGIOGRAPHY;  Surgeon: Corey Skains, MD;  Location: [facility];  Service: Cardiovascular;  Laterality: N/A;  . quadruple bypass    . RIGHT/LEFT HEART CATH AND CORONARY/GRAFT ANGIOGRAPHY N/A 07/30/2017   Procedure: RIGHT/LEFT HEART CATH AND CORONARY/GRAFT ANGIOGRAPHY;  Surgeon: Isaias Cowman, MD;  Location: [facility];  Service: Cardiovascular;  Laterality: N/A;  . TEE WITHOUT CARDIOVERSION  11/21/2015  . TEE WITHOUT CARDIOVERSION N/A 11/21/2015   Procedure: TRANSESOPHAGEAL ECHOCARDIOGRAM (TEE);  Surgeon: Larey Dresser, MD;  Location: [facility] ENDOSCOPY;  Service: Cardiovascular;  Laterality: N/A;    Family History  Problem Relation Age of Onset  . Stroke Mother   . Heart attack Mother   . Hypertension Mother   .  Heart attack Father   . Hypertension Father   . Heart attack Brother        #1  . Diabetes Brother        #1  . Heart disease Brother        #2  . Lung disease Brother        #2  . Hypertension Brother        #2  . Diabetes Brother        #2    Social History:  reports that he has never smoked. He has  never used smokeless tobacco. He reports that he does not drink alcohol or use drugs.  Allergies:  Allergies  Allergen Reactions  . Contrast Media [Iodinated Diagnostic Agents] Shortness Of Breath    Hypoxia/hypotension  . Versed [Midazolam] Other (See Comments)    Hypotension/hypoxia    Medications: I have reviewed the patient's current medications.  Results for orders placed or performed during the hospital encounter of 11/29/17 (from the past 48 hour(s))  CBC     Status: Abnormal   Collection Time: 11/29/17 10:21 AM  Result Value Ref Range   WBC 6.5 3.8 - 10.6 K/uL   RBC 4.06 (L) 4.40 - 5.90 MIL/uL   Hemoglobin 12.2 (L) 13.0 - 18.0 g/dL   HCT 35.3 (L) 40.0 - 52.0 %   MCV 86.9 80.0 - 100.0 fL   MCH 30.0 26.0 - 34.0 pg   MCHC 34.5 32.0 - 36.0 g/dL   RDW 15.1 (H) 11.5 - 14.5 %   Platelets 145 (L) 150 - 440 K/uL    Comment: Performed at [facility], [address] [place], [place] [address]  Basic metabolic panel     Status: Abnormal   Collection Time: 11/29/17 10:21 AM  Result Value Ref Range   Sodium 137 135 - 145 mmol/L   Potassium 4.0 3.5 - 5.1 mmol/L   Chloride 104 101 - 111 mmol/L   CO2 23 22 - 32 mmol/L   Glucose, Bld 179 (H) 65 - 99 mg/dL   BUN 24 (H) 6 - 20 mg/dL   Creatinine, Ser 0.83 0.61 - 1.24 mg/dL   Calcium 7.8 (L) 8.9 - 10.3 mg/dL   GFR calc non Af Amer >60 >60 mL/min   GFR calc Af Amer >60 >60 mL/min    Comment: (NOTE) The eGFR has been calculated using the CKD EPI equation. This calculation has not been validated in all clinical situations. eGFR's persistently <60 mL/min signify possible Chronic Kidney Disease.    Anion gap 10 5 - 15    Comment: Performed at [facility], [address] [place], [place] [address]  Troponin I     Status: Abnormal   Collection Time: 11/29/17 10:21 AM  Result Value Ref Range   Troponin I 0.15 (HH) <0.03 ng/mL    Comment: CRITICAL RESULT CALLED TO, READ BACK BY AND VERIFIED WITH [name]  11/29/17 @ 1102  [place] Performed at [facility], [address] [place], [place] [address]   Brain natriuretic peptide     Status: Abnormal   Collection Time: 11/29/17 10:21 AM  Result Value Ref Range   B Natriuretic Peptide 365.0 (H) 0.0 - 100.0 pg/mL    Comment: Performed at [facility], [address] [place], [place] [address]  Hepatic function panel     Status: Abnormal   Collection Time: 11/29/17 10:21 AM  Result Value Ref Range   Total Protein 7.0 6.5 - 8.1  g/dL   Albumin 3.8 3.5 - 5.0 g/dL   AST 29 15 - 41 U/L   ALT 22 17 - 63 U/L   Alkaline Phosphatase 46 38 - 126 U/L   Total Bilirubin 0.7 0.3 - 1.2 mg/dL   Bilirubin, Direct <0.1 (L) 0.1 - 0.5 mg/dL   Indirect Bilirubin NOT CALCULATED 0.3 - 0.9 mg/dL    Comment: Performed at [facility], [address] [place], [place] [address]  Troponin I     Status: Abnormal   Collection Time: 11/29/17 12:28 PM  Result Value Ref Range   Troponin I 0.19 (HH) <0.03 ng/mL    Comment: CRITICAL VALUE NOTED. VALUE IS CONSISTENT WITH PREVIOUSLY REPORTED/CALLED VALUE / [place] Performed at [facility], [address] [place], [place] [address]   Urinalysis, Complete w Microscopic     Status: Abnormal   Collection Time: 11/29/17 12:50 PM  Result Value Ref Range   Color, Urine STRAW (A) YELLOW   APPearance CLEAR (A) CLEAR   Specific Gravity, Urine 1.006 1.005 - 1.030   pH 5.0 5.0 - 8.0   Glucose, UA 50 (A) NEGATIVE mg/dL   Hgb urine dipstick NEGATIVE NEGATIVE   Bilirubin Urine NEGATIVE NEGATIVE   Ketones, ur NEGATIVE NEGATIVE mg/dL   Protein, ur NEGATIVE NEGATIVE mg/dL   Nitrite NEGATIVE NEGATIVE   Leukocytes, UA NEGATIVE NEGATIVE   RBC / HPF 0-5 0 - 5 RBC/hpf   WBC, UA NONE SEEN 0 - 5 WBC/hpf   Bacteria, UA NONE SEEN NONE SEEN   Squamous Epithelial / LPF NONE SEEN 0 - 5   Mucus PRESENT    Hyaline Casts, UA PRESENT     Comment: Performed at [facility], [address]  [place], [place] [address]  Glucose, capillary     Status: None   Collection Time: 11/29/17  3:57 PM  Result Value Ref Range   Glucose-Capillary 96 65 - 99 mg/dL  Troponin I-serum (0, 3, 6 hours)     Status: Abnormal   Collection Time: 11/29/17  4:19 PM  Result Value Ref Range   Troponin I 0.25 (HH) <0.03 ng/mL    Comment: CRITICAL VALUE NOTED. VALUE IS CONSISTENT WITH PREVIOUSLY REPORTED/CALLED VALUE / [place] Performed at [facility], [address] [place], [place] [address]   Troponin I-serum (0, 3, 6 hours)     Status: Abnormal   Collection Time: 11/29/17  5:37 PM  Result Value Ref Range   Troponin I 0.24 (HH) <0.03 ng/mL    Comment: CRITICAL VALUE NOTED. VALUE IS CONSISTENT WITH PREVIOUSLY REPORTED/CALLED VALUE / [place] Performed at [facility], [address] [place], [place] [address]   Glucose, capillary     Status: Abnormal   Collection Time: 11/29/17  8:45 PM  Result Value Ref Range   Glucose-Capillary 179 (H) 65 - 99 mg/dL   Comment 1 Notify RN    Comment 2 Document in Chart   Troponin I-serum (0, 3, 6 hours)     Status: Abnormal   Collection Time: 11/29/17  8:49 PM  Result Value Ref Range   Troponin I 0.18 (HH) <0.03 ng/mL    Comment: CRITICAL VALUE NOTED. VALUE IS CONSISTENT WITH PREVIOUSLY REPORTED/CALLED VALUE [facility] Performed at [facility], [address] [place],  [address]   Glucose, capillary     Status: Abnormal   Collection Time: 11/30/17  7:34 AM  Result Value Ref Range   Glucose-Capillary 180 (H) 65 - 99 mg/dL  Comment 1 Notify RN    Comment 2 Document in Chart     Dg Chest 2 View  Result Date: 11/29/2017 CLINICAL DATA:  Non smoker, increasing SOB this morning and feels like he's choking. Hx of multiple heart issues including recent stent placement, CHF and surgery at [facility] 2012 EXAM: CHEST - 2 VIEW COMPARISON:  10/30/2017 FINDINGS: Hyperinflation. Prior median sternotomy. Midline trachea. Normal heart size and mediastinal  contours. Atherosclerosis in the transverse aorta. No pleural effusion or pneumothorax. Mild lower lobe predominant interstitial thickening. Mild right minor fissure thickening. IMPRESSION: No acute cardiopulmonary disease. Hyperinflation and interstitial thickening, most consistent with COPD/chronic bronchitis. Electronically Signed   By: Abigail Miyamoto M.D.   On: 11/29/2017 10:41   Ct Head Wo Contrast  Result Date: 11/29/2017 CLINICAL DATA:  Headache since last night. EXAM: CT HEAD WITHOUT CONTRAST TECHNIQUE: Contiguous axial images were obtained from the base of the skull through the vertex without intravenous contrast. COMPARISON:  Head CT scan 02/28/2017.  Brain MRI 03/05/2017. FINDINGS: Brain: No evidence of acute infarction, hemorrhage, hydrocephalus, extra-axial collection or mass lesion/mass effect. Remote right cerebellar infarct is noted. Vascular: Atherosclerotic vascular disease identified. Skull: Intact.  No focal lesion. Sinuses/Orbits: Negative. Other: None. IMPRESSION: No acute abnormality. Remote right cerebellar infarct. Atherosclerosis. Electronically Signed   By: Inge Rise M.D.   On: 11/29/2017 14:53    Review of Systems  Constitutional: Positive for diaphoresis and malaise/fatigue.  HENT: Positive for congestion.   Eyes: Negative.   Respiratory: Positive for cough and shortness of breath.   Cardiovascular: Positive for palpitations, orthopnea and PND.  Gastrointestinal: Negative.   Genitourinary: Negative.   Musculoskeletal: Positive for myalgias.  Skin: Negative.   Neurological: Positive for weakness.  Endo/Heme/Allergies: Negative.   Psychiatric/Behavioral: Negative.    Blood pressure 136/67, pulse (!) 53, temperature 97.8 F (36.6 C), temperature source Oral, resp. rate 18, height _0  (1.702 m), weight 274 lb (124.3 kg), SpO2 98 %. Physical Exam  Nursing note and vitals reviewed. Constitutional: He is oriented to person, place, and time. He appears  well-developed and well-nourished.  HENT:  Head: Normocephalic and atraumatic.  Eyes: Pupils are equal, round, and reactive to light. Conjunctivae and EOM are normal.  Neck: Normal range of motion. Neck supple.  Cardiovascular: Normal rate and regular rhythm.  Murmur heard. Respiratory: Breath sounds normal. He is in respiratory distress.  GI: Soft. Bowel sounds are normal.  Musculoskeletal: Normal range of motion. He exhibits edema.  Neurological: He is alert and oriented to person, place, and time. He has normal reflexes.  Skin: Skin is warm and dry.  Psychiatric: He has a normal mood and affect.    Assessment/Plan: Choking Shortness of breath Congestion Atrial fibrillation Aortic stenosis moderate Coronary artery disease History of coronary bypass surgery Obesity Obstructive sleep apnea Diabetes Hypertension GERD Borderline troponins . Plan Agree with admit to rule out myocardial infarction Continue telemetry for further assessment Follow-up troponins Continue therapy for diabetes Maintain patient on CPAP for his obstructive sleep apnea Continue Eliquis for anticoagulation for now Hypertension management and control with current meds Continue GERD therapy prophylaxis Recommend weight loss exercise portion control Consider ENT for evaluation of choking sensation and evaluation of swallowing Consider GI evaluation as well Doubt significant failure Consider inhalers for COPD  Dwayne D Callwood 11/30/2017, 7:49 AM

## 2017-11-30 NOTE — Care Management Obs Status (Signed)
MEDICARE OBSERVATION STATUS NOTIFICATION   Patient Details  Name: MANNING LUNA MRN: [id number] Date of Birth: 02/08/1956   Medicare Observation Status Notification Given:  Yes    Katrina Stack, RN 11/30/2017, 11:07 AM

## 2017-12-03 ENCOUNTER — Emergency Department: Payer: Medicare HMO

## 2017-12-03 ENCOUNTER — Encounter: Payer: Self-pay | Admitting: Internal Medicine

## 2017-12-03 ENCOUNTER — Inpatient Hospital Stay
Admission: EM | Admit: 2017-12-03 | Discharge: 2017-12-05 | DRG: 280 | Disposition: A | Discharge status: Home / Self Care | Payer: Medicare HMO | Attending: Internal Medicine | Admitting: Internal Medicine

## 2017-12-03 ENCOUNTER — Other Ambulatory Visit: Payer: Self-pay

## 2017-12-03 ENCOUNTER — Encounter: Payer: Self-pay | Admitting: *Deleted

## 2017-12-03 DIAGNOSIS — I480 Paroxysmal atrial fibrillation: Secondary | ICD-10-CM | POA: Diagnosis present

## 2017-12-03 DIAGNOSIS — I25110 Atherosclerotic heart disease of native coronary artery with unstable angina pectoris: Secondary | ICD-10-CM | POA: Diagnosis present

## 2017-12-03 DIAGNOSIS — I214 Non-ST elevation (NSTEMI) myocardial infarction: Secondary | ICD-10-CM | POA: Diagnosis present

## 2017-12-03 DIAGNOSIS — Z955 Presence of coronary angioplasty implant and graft: Secondary | ICD-10-CM | POA: Diagnosis not present

## 2017-12-03 DIAGNOSIS — I350 Nonrheumatic aortic (valve) stenosis: Secondary | ICD-10-CM | POA: Diagnosis present

## 2017-12-03 DIAGNOSIS — I249 Acute ischemic heart disease, unspecified: Secondary | ICD-10-CM

## 2017-12-03 DIAGNOSIS — I110 Hypertensive heart disease with heart failure: Secondary | ICD-10-CM | POA: Diagnosis present

## 2017-12-03 DIAGNOSIS — K219 Gastro-esophageal reflux disease without esophagitis: Secondary | ICD-10-CM | POA: Diagnosis present

## 2017-12-03 DIAGNOSIS — Z794 Long term (current) use of insulin: Secondary | ICD-10-CM

## 2017-12-03 DIAGNOSIS — J449 Chronic obstructive pulmonary disease, unspecified: Secondary | ICD-10-CM | POA: Diagnosis present

## 2017-12-03 DIAGNOSIS — R7989 Other specified abnormal findings of blood chemistry: Secondary | ICD-10-CM | POA: Diagnosis present

## 2017-12-03 DIAGNOSIS — Z89421 Acquired absence of other right toe(s): Secondary | ICD-10-CM | POA: Diagnosis not present

## 2017-12-03 DIAGNOSIS — G4733 Obstructive sleep apnea (adult) (pediatric): Secondary | ICD-10-CM | POA: Diagnosis present

## 2017-12-03 DIAGNOSIS — Z91041 Radiographic dye allergy status: Secondary | ICD-10-CM

## 2017-12-03 DIAGNOSIS — M1990 Unspecified osteoarthritis, unspecified site: Secondary | ICD-10-CM | POA: Diagnosis present

## 2017-12-03 DIAGNOSIS — I200 Unstable angina: Secondary | ICD-10-CM | POA: Diagnosis present

## 2017-12-03 DIAGNOSIS — Z8673 Personal history of transient ischemic attack (TIA), and cerebral infarction without residual deficits: Secondary | ICD-10-CM | POA: Diagnosis not present

## 2017-12-03 DIAGNOSIS — Z7902 Long term (current) use of antithrombotics/antiplatelets: Secondary | ICD-10-CM | POA: Diagnosis not present

## 2017-12-03 DIAGNOSIS — Z7901 Long term (current) use of anticoagulants: Secondary | ICD-10-CM | POA: Diagnosis not present

## 2017-12-03 DIAGNOSIS — Z951 Presence of aortocoronary bypass graft: Secondary | ICD-10-CM

## 2017-12-03 DIAGNOSIS — I252 Old myocardial infarction: Secondary | ICD-10-CM | POA: Diagnosis not present

## 2017-12-03 DIAGNOSIS — E119 Type 2 diabetes mellitus without complications: Secondary | ICD-10-CM | POA: Diagnosis present

## 2017-12-03 DIAGNOSIS — I5023 Acute on chronic systolic (congestive) heart failure: Secondary | ICD-10-CM | POA: Diagnosis present

## 2017-12-03 DIAGNOSIS — Z888 Allergy status to other drugs, medicaments and biological substances status: Secondary | ICD-10-CM

## 2017-12-03 DIAGNOSIS — I482 Chronic atrial fibrillation: Secondary | ICD-10-CM | POA: Diagnosis present

## 2017-12-03 DIAGNOSIS — Z8679 Personal history of other diseases of the circulatory system: Secondary | ICD-10-CM

## 2017-12-03 DIAGNOSIS — Z79899 Other long term (current) drug therapy: Secondary | ICD-10-CM | POA: Diagnosis not present

## 2017-12-03 DIAGNOSIS — Z9989 Dependence on other enabling machines and devices: Secondary | ICD-10-CM | POA: Diagnosis not present

## 2017-12-03 LAB — CBC WITH DIFFERENTIAL/PLATELET
BASOS ABS: 0.1 10*3/uL (ref 0–0.1)
Basophils Relative: 1 %
Eosinophils Absolute: 0.3 10*3/uL (ref 0–0.7)
Eosinophils Relative: 4 %
HEMATOCRIT: 34.5 % — AB (ref 40.0–52.0)
HEMOGLOBIN: 12 g/dL — AB (ref 13.0–18.0)
LYMPHS PCT: 17 %
Lymphs Abs: 1.2 10*3/uL (ref 1.0–3.6)
MCH: 30.1 pg (ref 26.0–34.0)
MCHC: 34.6 g/dL (ref 32.0–36.0)
MCV: 86.9 fL (ref 80.0–100.0)
Monocytes Absolute: 0.5 10*3/uL (ref 0.2–1.0)
Monocytes Relative: 8 %
NEUTROS ABS: 4.8 10*3/uL (ref 1.4–6.5)
NEUTROS PCT: 70 %
Platelets: 155 10*3/uL (ref 150–440)
RBC: 3.97 MIL/uL — AB (ref 4.40–5.90)
RDW: 15.4 % — ABNORMAL HIGH (ref 11.5–14.5)
WBC: 6.8 10*3/uL (ref 3.8–10.6)

## 2017-12-03 LAB — COMPREHENSIVE METABOLIC PANEL
ALT: 31 U/L (ref 17–63)
AST: 33 U/L (ref 15–41)
Albumin: 3.9 g/dL (ref 3.5–5.0)
Alkaline Phosphatase: 54 U/L (ref 38–126)
Anion gap: 11 (ref 5–15)
BUN: 17 mg/dL (ref 6–20)
CHLORIDE: 106 mmol/L (ref 101–111)
CO2: 22 mmol/L (ref 22–32)
Calcium: 9 mg/dL (ref 8.9–10.3)
Creatinine, Ser: 1 mg/dL (ref 0.61–1.24)
GFR calc non Af Amer: >60 mL/min (ref >60)
Glucose, Bld: 283 mg/dL — ABNORMAL HIGH (ref 65–99)
POTASSIUM: 3.6 mmol/L (ref 3.5–5.1)
SODIUM: 139 mmol/L (ref 135–145)
Total Bilirubin: 0.5 mg/dL (ref 0.3–1.2)
Total Protein: 7 g/dL (ref 6.5–8.1)

## 2017-12-03 LAB — GLUCOSE, CAPILLARY
GLUCOSE-CAPILLARY: 197 mg/dL — AB (ref 65–99)
Glucose-Capillary: 148 mg/dL — ABNORMAL HIGH (ref 65–99)

## 2017-12-03 LAB — PROTIME-INR
INR: 0.98
PROTHROMBIN TIME: 12.9 s (ref 11.4–15.2)

## 2017-12-03 LAB — TROPONIN I: Troponin I: 0.05 ng/mL (ref <0.03)

## 2017-12-03 LAB — APTT: aPTT: 36 seconds (ref 24–36)

## 2017-12-03 LAB — BRAIN NATRIURETIC PEPTIDE: B NATRIURETIC PEPTIDE 5: 446 pg/mL — AB (ref 0.0–100.0)

## 2017-12-03 MED ORDER — HEPARIN BOLUS VIA INFUSION
4000.0000 [IU] | Freq: Once | INTRAVENOUS | Status: AC
  Administered 2017-12-03: 4000 [IU] via INTRAVENOUS
  Filled 2017-12-03: qty 4000

## 2017-12-03 MED ORDER — ACETAMINOPHEN 650 MG RE SUPP: 650.0000 mg | Freq: Four times a day (QID) | RECTAL | Status: DC | PRN

## 2017-12-03 MED ORDER — INSULIN ASPART 100 UNIT/ML ~~LOC~~ SOLN
0.0000 [IU] | Freq: Every day | SUBCUTANEOUS | Status: DC
  Administered 2017-12-04: 2 [IU] via SUBCUTANEOUS
  Filled 2017-12-03: qty 1

## 2017-12-03 MED ORDER — FUROSEMIDE 10 MG/ML IJ SOLN
60.0000 mg | Freq: Every day | INTRAMUSCULAR | Status: DC
Start: 2017-12-03 — End: 2017-12-05
  Administered 2017-12-03 – 2017-12-05 (×3): 60 mg via INTRAVENOUS
  Filled 2017-12-03 (×2): qty 6
  Filled 2017-12-03: qty 8

## 2017-12-03 MED ORDER — ALBUTEROL SULFATE (2.5 MG/3ML) 0.083% IN NEBU: 2.5000 mg | INHALATION_SOLUTION | RESPIRATORY_TRACT | Status: DC | PRN

## 2017-12-03 MED ORDER — PROCHLORPERAZINE EDISYLATE 10 MG/2ML IJ SOLN
INTRAMUSCULAR | Status: AC
  Administered 2017-12-03: 10 mg via INTRAVENOUS
  Filled 2017-12-03: qty 2

## 2017-12-03 MED ORDER — ONDANSETRON HCL 4 MG/2ML IJ SOLN: 4.0000 mg | Freq: Four times a day (QID) | INTRAMUSCULAR | Status: DC | PRN

## 2017-12-03 MED ORDER — ACETAMINOPHEN 325 MG PO TABS
650.0000 mg | ORAL_TABLET | Freq: Four times a day (QID) | ORAL | Status: DC | PRN
  Administered 2017-12-04: 650 mg via ORAL
  Filled 2017-12-03: qty 2

## 2017-12-03 MED ORDER — ONDANSETRON HCL 4 MG PO TABS: 4.0000 mg | ORAL_TABLET | Freq: Four times a day (QID) | ORAL | Status: DC | PRN

## 2017-12-03 MED ORDER — ASPIRIN 81 MG PO CHEW
324.0000 mg | CHEWABLE_TABLET | Freq: Once | ORAL | Status: AC
  Administered 2017-12-03: 324 mg via ORAL

## 2017-12-03 MED ORDER — SODIUM CHLORIDE 0.9% FLUSH
3.0000 mL | Freq: Two times a day (BID) | INTRAVENOUS | Status: DC
  Administered 2017-12-03 – 2017-12-05 (×4): 3 mL via INTRAVENOUS

## 2017-12-03 MED ORDER — INSULIN ASPART 100 UNIT/ML ~~LOC~~ SOLN
0.0000 [IU] | Freq: Three times a day (TID) | SUBCUTANEOUS | Status: DC
  Administered 2017-12-04 – 2017-12-05 (×3): 3 [IU] via SUBCUTANEOUS
  Filled 2017-12-03 (×3): qty 1

## 2017-12-03 MED ORDER — HEPARIN (PORCINE) IN NACL 100-0.45 UNIT/ML-% IJ SOLN
1350.0000 [IU]/h | INTRAMUSCULAR | Status: DC
  Administered 2017-12-03: 1000 [IU]/h via INTRAVENOUS
  Filled 2017-12-03 (×3): qty 250

## 2017-12-03 MED ORDER — BISACODYL 10 MG RE SUPP: 10.0000 mg | Freq: Every day | RECTAL | Status: DC | PRN

## 2017-12-03 MED ORDER — NITROGLYCERIN IN D5W 200-5 MCG/ML-% IV SOLN
0.0000 ug/min | Freq: Once | INTRAVENOUS | Status: AC
  Administered 2017-12-03: 5 ug/min via INTRAVENOUS
  Filled 2017-12-03: qty 250

## 2017-12-03 MED ORDER — PROCHLORPERAZINE EDISYLATE 10 MG/2ML IJ SOLN
10.0000 mg | Freq: Once | INTRAMUSCULAR | Status: AC
  Administered 2017-12-03: 10 mg via INTRAVENOUS

## 2017-12-03 NOTE — Progress Notes (Addendum)
ANTICOAGULATION CONSULT NOTE - Initial Consult  Pharmacy Consult for heparin gtt Indication: chest pain/ACS  Allergies  Allergen Reactions  . Contrast Media [Iodinated Diagnostic Agents] Shortness Of Breath    Hypoxia/hypotension  . Versed [Midazolam] Other (See Comments)    Hypotension/hypoxia    Patient Measurements: Height: 5\' 7"  (170.2 cm) Weight: 274 lb (124.3 kg) IBW/kg (Calculated) : 66.1 Heparin Dosing Weight: 95.1kg  Vital Signs: Pulse Rate: 67 (06/20 1957)  Labs: Recent Labs    12/03/17 2000  HGB 12.0*  HCT 34.5*  PLT 155    Estimated Creatinine Clearance: 116.7 mL/min (by C-G formula based on SCr of 0.83 mg/dL).   Medical History: Past Medical History:  Diagnosis Date  . A-fib ([place])   . Aortic stenosis, moderate 11/2015  . Arthritis   . CAD (coronary artery disease)   . Chronic systolic CHF (congestive heart failure) ([place])   . Diabetes mellitus without complication ([place])    INSULIN DEPENDENT  . GERD (gastroesophageal reflux disease)   . History of cardioversion   . Hypertension   . MI (myocardial infarction) ([place])   . OSA on CPAP   . Sepsis ([place])   . Shortness of breath dyspnea   . Stroke ([place])   . SVT (supraventricular tachycardia) ([facility])     Medications:   (Not in a hospital admission) Scheduled:  . heparin  4,000 Units Intravenous Once   Infusions:  . heparin     PRN:  Anti-infectives (From admission, onward)   None      Assessment: 62 year old male with ACS requiring anticoagulation with heparin gtt per pharmacy consult. Will need to monitor aPTT until correlates with HL because pt prior on eliquis.   Goal of Therapy:  Heparin level 0.3-0.7 units/ml Monitor platelets by anticoagulation protocol: Yes  [id number] goal   Plan:  Give 4000 units bolus x 1 Start heparin infusion at 1000 units/hr Check anti-Xa level in 6 hours and daily while on heparin Continue to monitor H&H and platelets  Donna Christen Khayree Delellis 12/03/2017,8:21  PM

## 2017-12-03 NOTE — ED Triage Notes (Signed)
PT to ED reporting sudden onset of CP at 1800. Stent placed 1 month ago after a MI. PT reports he took three Nitro without relief, no aspirin because pt is on eliquis.   Pain is in left side of chest. NO SOB, dizziness, nausea or back pain reported.

## 2017-12-03 NOTE — H&P (Signed)
[facility] [place] NAME: Marcus Williams    MR#:  [id number]  DATE OF BIRTH:  Jan 06, 1956  DATE OF ADMISSION:  12/03/2017  PRIMARY CARE PHYSICIAN: Idelle Crouch, MD   REQUESTING/REFERRING PHYSICIAN: Dr. Mable Paris  CHIEF COMPLAINT:  No chief complaint on file.  chest pain  HISTORY OF PRESENT ILLNESS:  Marcus Williams  is a 62 y.o. male with a known history of hypertension, diabetes, CAD with cardiac catheterization and PCI in May 2019, atrial fibrillation on anticoagulation presents to the emergency room complaining of acute onset chest pain. Patient has not had any chest pain since his procedure. Today his EKG shows ST depression. It troponin normal. Due to ongoing chest pain cardiology was consulted and suggested starting patient on nitro drip and heparin drip for cardiac catheterization in the morning. Patient's chest x-ray also shows pulmonary edema.  PAST MEDICAL HISTORY:   Past Medical History:  Diagnosis Date  . A-fib ([place])   . Aortic stenosis, moderate 11/2015  . Arthritis   . CAD (coronary artery disease)   . Chronic systolic CHF (congestive heart failure) ([place])   . Diabetes mellitus without complication ([place])    INSULIN DEPENDENT  . GERD (gastroesophageal reflux disease)   . History of cardioversion   . Hypertension   . MI (myocardial infarction) ([place])   . OSA on CPAP   . Sepsis ([place])   . Shortness of breath dyspnea   . Stroke ([place])   . SVT (supraventricular tachycardia) ([place])     PAST SURGICAL HISTORY:   Past Surgical History:  Procedure Laterality Date  . 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 BUNIONECTOMY    . CARDIAC CATHETERIZATION N/A 10/02/2015   Procedure: Coronary/Grafts Angiography;  Surgeon: Teodoro Spray, MD;  Location: [facility];  Service: Cardiovascular;  Laterality: N/A;  . CARDIAC  CATHETERIZATION N/A 11/21/2015   Procedure: Right and Left Heart Cath;  Surgeon: Sherren Mocha, MD;  Location: [facility];  Service: Cardiovascular;  Laterality: N/A;  . CORONARY ARTERY BYPASS GRAFT    . CORONARY STENT INTERVENTION N/A 07/30/2017   Procedure: CORONARY STENT INTERVENTION;  Surgeon: Isaias Cowman, MD;  Location: [facility];  Service: Cardiovascular;  Laterality: N/A;  . CORONARY STENT INTERVENTION N/A 10/23/2017   Procedure: CORONARY STENT INTERVENTION;  Surgeon: Yolonda Kida, MD;  Location: [facility];  Service: Cardiovascular;  Laterality: N/A;  . KNEE SURGERY    . LEFT HEART CATH AND CORONARY ANGIOGRAPHY N/A 10/30/2017   Procedure: LEFT HEART CATH AND CORONARY ANGIOGRAPHY;  Surgeon: Teodoro Spray, MD;  Location: [facility];  Service: Cardiovascular;  Laterality: N/A;  . LEFT HEART CATH AND CORS/GRAFTS ANGIOGRAPHY N/A 10/22/2017   Procedure: LEFT HEART CATH AND CORS/GRAFTS ANGIOGRAPHY;  Surgeon: Corey Skains, MD;  Location: [facility];  Service: Cardiovascular;  Laterality: N/A;  . quadruple bypass    . RIGHT/LEFT HEART CATH AND CORONARY/GRAFT ANGIOGRAPHY N/A 07/30/2017   Procedure: RIGHT/LEFT HEART CATH AND CORONARY/GRAFT ANGIOGRAPHY;  Surgeon: Isaias Cowman, MD;  Location: [facility];  Service: Cardiovascular;  Laterality: N/A;  . TEE WITHOUT CARDIOVERSION  11/21/2015  . TEE WITHOUT CARDIOVERSION N/A 11/21/2015   Procedure: TRANSESOPHAGEAL ECHOCARDIOGRAM (TEE);  Surgeon: Larey Dresser, MD;  Location: [facility];  Service: Cardiovascular;  Laterality: N/A;  SOCIAL HISTORY:   Social History   Tobacco Use  . Smoking status: Never Smoker  . Smokeless tobacco: Never Used  Substance Use Topics  . Alcohol use: No    FAMILY HISTORY:   Family History  Problem Relation Age of Onset  . Stroke Mother   . Heart attack Mother   . Hypertension Mother   . Heart attack Father   . Hypertension  Father   . Heart attack Brother        #1  . Diabetes Brother        #1  . Heart disease Brother        #2  . Lung disease Brother        #2  . Hypertension Brother        #2  . Diabetes Brother        #2    DRUG ALLERGIES:   Allergies  Allergen Reactions  . Contrast Media [Iodinated Diagnostic Agents] Shortness Of Breath    Hypoxia/hypotension  . Versed [Midazolam] Other (See Comments)    Hypotension/hypoxia    REVIEW OF SYSTEMS:   Review of Systems  Constitutional: Positive for malaise/fatigue. Negative for chills, fever and weight loss.  HENT: Negative for hearing loss and nosebleeds.   Eyes: Negative for blurred vision, double vision and pain.  Respiratory: Positive for shortness of breath. Negative for cough, hemoptysis, sputum production and wheezing.   Cardiovascular: Positive for chest pain. Negative for palpitations, orthopnea and leg swelling.  Gastrointestinal: Negative for abdominal pain, constipation, diarrhea, nausea and vomiting.  Genitourinary: Negative for dysuria and hematuria.  Musculoskeletal: Negative for back pain, falls and myalgias.  Skin: Negative for rash.  Neurological: Negative for dizziness, tremors, sensory change, speech change, focal weakness, seizures and headaches.  Endo/Heme/Allergies: Does not bruise/bleed easily.  Psychiatric/Behavioral: Negative for depression and memory loss. The patient is not nervous/anxious.     MEDICATIONS AT HOME:   Prior to Admission medications   Medication Sig Start Date End Date Taking? Authorizing Provider  apixaban (ELIQUIS) 5 MG TABS tablet Take 5 mg by mouth 2 (two) times daily.   Yes [provider]  atorvastatin (LIPITOR) 40 MG tablet Take 1 tablet (40 mg total) by mouth daily at 6 PM. 10/24/17  Yes Gouru, Aruna, MD  baclofen (LIORESAL) 10 MG tablet Take 10 mg by mouth 2 (two) times daily as needed for muscle spasms.    Yes [provider]  clopidogrel (PLAVIX) 75 MG tablet Take 1  tablet (75 mg total) by mouth daily with breakfast. 08/01/17  Yes Max Sane, MD  diltiazem (CARDIZEM CD) 180 MG 24 hr capsule Take 180 mg by mouth 2 (two) times daily.   Yes [provider]  DULoxetine (CYMBALTA) 20 MG capsule Take 20 mg by mouth daily.   Yes [provider]  Fluticasone-Salmeterol (ADVAIR) 250-50 MCG/DOSE AEPB Inhale 1 puff into the lungs 2 (two) times daily. 11/30/17  Yes Gladstone Lighter, MD  insulin NPH-regular Human (NOVOLIN 70/30) (70-30) 100 UNIT/ML injection Inject 85-100 Units into the skin 2 (two) times daily with a meal. 85 units in the morning and 100 units in the evening   Yes [provider]  lisinopril (PRINIVIL,ZESTRIL) 10 MG tablet Take 1 tablet (10 mg total) by mouth daily. 08/14/17  Yes Gladstone Lighter, MD  metFORMIN (GLUCOPHAGE) 1000 MG tablet Take 1,000 mg by mouth 2 (two) times daily with a meal.   Yes [provider]  metoprolol (LOPRESSOR) 100 MG tablet Take 100  mg by mouth 2 (two) times daily. 10/15/15  Yes [provider]  nitroGLYCERIN (NITROSTAT) 0.4 MG SL tablet Place 0.4 mg under the tongue every 5 (five) minutes as needed for chest pain.   Yes [provider]  pantoprazole (PROTONIX) 40 MG tablet Take 40 mg by mouth daily.   Yes [provider]  potassium chloride 20 MEQ TBCR Take 20 mEq by mouth daily. 11/23/15  Yes Shirley Friar, PA-C  potassium chloride SA (K-DUR,KLOR-CON) 20 MEQ tablet Take 1 tablet by mouth daily. 11/30/17  Yes [provider]  pramipexole (MIRAPEX) 0.5 MG tablet Take 0.5 mg by mouth 3 (three) times daily as needed.    Yes [provider]  torsemide (DEMADEX) 20 MG tablet Take 2 tablets (40 mg total) by mouth daily. 08/14/17  Yes Gladstone Lighter, MD  zolpidem (AMBIEN) 5 MG tablet Take 1 tablet (5 mg total) by mouth at bedtime as needed for sleep. 11/01/17  Yes Fritzi Mandes, MD  albuterol (PROVENTIL HFA;VENTOLIN HFA) 108 (90 Base) MCG/ACT inhaler  Inhale 2 puffs into the lungs every 6 (six) hours as needed for wheezing or shortness of breath. 07/11/15   Flora Lipps, MD     VITAL SIGNS:  Blood pressure 136/65, pulse 77, resp. rate 18, height 5\' 7"  (1.702 m), weight 124.3 kg (274 lb), SpO2 92 %.  PHYSICAL EXAMINATION:  Physical Exam  GENERAL:  62 y.o.-year-old patient lying in the bed with no acute distress. Obese EYES: Pupils equal, round, reactive to light and accommodation. No scleral icterus. Extraocular muscles intact.  HEENT: Head atraumatic, normocephalic. Oropharynx and nasopharynx clear. No oropharyngeal erythema, moist oral mucosa  NECK:  Supple, no jugular venous distention. No thyroid enlargement, no tenderness.  LUNGS: Normal breath sounds bilaterally, no wheezing, rales, rhonchi. No use of accessory muscles of respiration.  CARDIOVASCULAR: S1, S2 normal. No murmurs, rubs, or gallops.  ABDOMEN: Soft, nontender, nondistended. Bowel sounds present. No organomegaly or mass.  EXTREMITIES: No pedal edema, cyanosis, or clubbing. + 2 pedal & radial pulses b/l.   NEUROLOGIC: Cranial nerves II through XII are intact. No focal Motor or sensory deficits appreciated b/l PSYCHIATRIC: The patient is alert and oriented x 3. Good affect.  SKIN: No obvious rash, lesion, or ulcer.   LABORATORY PANEL:   CBC Recent Labs  Lab 12/03/17 2000  WBC 6.8  HGB 12.0*  HCT 34.5*  PLT 155   ------------------------------------------------------------------------------------------------------------------  Chemistries  Recent Labs  Lab 12/03/17 2000  NA 139  K 3.6  CL 106  CO2 22  GLUCOSE 283*  BUN 17  CREATININE 1.00  CALCIUM 9.0  AST 33  ALT 31  ALKPHOS 54  BILITOT 0.5   ------------------------------------------------------------------------------------------------------------------  Cardiac Enzymes Recent Labs  Lab 12/03/17 2000  TROPONINI 0.05*    ------------------------------------------------------------------------------------------------------------------  RADIOLOGY:  Dg Chest Port 1 View  Result Date: 12/03/2017 CLINICAL DATA:  STEMI EXAM: PORTABLE CHEST 1 VIEW COMPARISON:  11/29/2017, 10/30/2017 FINDINGS: Post sternotomy changes with sternal wire fractures as before. Cardiomegaly with vascular congestion. No acute airspace disease, pleural effusion or pneumothorax. IMPRESSION: No active disease.  Cardiomegaly with mild vascular congestion Electronically Signed   By: Donavan Foil M.D.   On: 12/03/2017 20:21     IMPRESSION AND PLAN:   *Chest pain concerning for unstable angina in a patient with recent PCI case has been discussed with cardiology. Start nitro drip and heparin drip. Initial troponin is normal and will be repeated. EKG shows some ST depression. Continue aspirin Plavix.  *  Acute on chronic systolic congestive heart failure. Give status of IV Lasix. Schedule IV Lasix from tomorrow. Potassium supplements. Moderate input and output.  *Paroxysmal atrial fibrillation. Continue rate controlled medications. Anticoagulation health in anticipation of cardiac catheterization in the morning.  *Diabetes mellitus. Sliding scale insulin. Continue insulin but at a lower dose of 50 units BID. Patient takes 85 units BID at home.  *Hypertension. Continual medications  All the records are reviewed and case discussed with ED provider. Management plans discussed with the patient, family and they are in agreement.  CODE STATUS: FULL CODE  TOTAL TIME TAKING CARE OF THIS PATIENT: 40 minutes.   Leia Alf Marcus Williams M.D on 12/03/2017 at 9:27 PM  Between 7am to 6pm - Pager - [phone]  After 6pm go to [email or web address] - password [email or web address] [facility]  Office  [phone]  CC: Primary care physician; Idelle Crouch, MD  Note: This dictation was prepared with Dragon dictation along with smaller phrase  technology. Any transcriptional errors that result from this process are unintentional.

## 2017-12-03 NOTE — ED Provider Notes (Signed)
[facility] Emergency Department Provider Note  ____________________________________________   First MD Initiated Contact with Patient 12/03/17 1953     (approximate)  I have reviewed the triage vital signs and the nursing notes.   HISTORY  Chief Complaint No chief complaint on file.   HPI Marcus Williams is a 62 y.o. male who self presents to the emergency department with roughly 2 hours of constant moderate to severe aching discomfort in his left chest.  Symptoms began gradually and are now constant.  Some nausea no vomiting.  Mild shortness of breath.  No diaphoresis.  Nonexertional.  He does have a long-standing history of coronary artery disease including drug-eluting stents placed about 3 weeks ago.  He also has congestive heart failure as well as diabetes hypertension and morbid obesity.  He takes Plavix as well as Eliquis and reports compliance with his medications.  His pain was not sudden onset.  Was not ripping or tearing and did not go straight to his back.  He also has mild throbbing bifrontal gradual onset headache similar to previous headaches.  Past Medical History:  Diagnosis Date  . A-fib ([place])   . Aortic stenosis, moderate 11/2015  . Arthritis   . CAD (coronary artery disease)   . Chronic systolic CHF (congestive heart failure) ([place])   . Diabetes mellitus without complication ([place])    INSULIN DEPENDENT  . GERD (gastroesophageal reflux disease)   . History of cardioversion   . Hypertension   . MI (myocardial infarction) ([place])   . OSA on CPAP   . Sepsis ([place])   . Shortness of breath dyspnea   . Stroke ([place])   . SVT (supraventricular tachycardia) [facility])     Patient Active Problem List   Diagnosis Date Noted  . Unstable angina ([place]) 12/03/2017  . Elevated troponin 11/29/2017  . Pneumonia 08/10/2017  . NSTEMI (non-ST elevated myocardial infarction) ([place]) 07/28/2017  . TIA (transient ischemic attack) 03/01/2017  . Aortic stenosis  11/21/2015  . Acute on chronic diastolic CHF (congestive heart failure), NYHA class 3 ([place]) 11/21/2015  . Aortic valve, bicuspid 09/24/2015  . Cellulitis 05/04/2015  . Acute respiratory failure with hypoxia ([place]) 03/26/2015  . CHF (congestive heart failure) ([place]) 01/15/2015  . HTN (hypertension) 01/14/2015  . Type II diabetes mellitus ([place]) 01/14/2015  . COPD (chronic obstructive pulmonary disease) ([place]) 01/14/2015  . OSA on CPAP 01/14/2015  . CAD (coronary artery disease) 01/14/2015  . A-fib ([place]) 01/14/2015  . Pain of left calf 01/14/2015  . Acute on chronic systolic CHF (congestive heart failure) ([place]) 01/14/2015  . COPD exacerbation ([place]) 12/22/2014  . Diabetes ([place]) 10/14/2014    Past Surgical History:  Procedure Laterality Date  . 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 BUNIONECTOMY    . CARDIAC CATHETERIZATION N/A 10/02/2015   Procedure: Coronary/G[place]s Angiography;  Surgeon: Teodoro Spray, MD;  Location: [place] CV LAB;  Service: Cardiovascular;  Laterality: N/A;  . CARDIAC CATHETERIZATION N/A 11/21/2015   Procedure: Right and Left Heart Cath;  Surgeon: Sherren Mocha, MD;  Location: [place] CV LAB;  Service: Cardiovascular;  Laterality: N/A;  . CORONARY ARTERY BYPASS GRAFT    . CORONARY STENT INTERVENTION N/A 07/30/2017   Procedure: CORONARY STENT INTERVENTION;  Surgeon: Isaias Cowman, MD;  Location: [place] CV LAB;  Service: Cardiovascular;  Laterality: N/A;  . CORONARY STENT INTERVENTION N/A 10/23/2017  Procedure: CORONARY STENT INTERVENTION;  Surgeon: Yolonda Kida, MD;  Location: [place] CV LAB;  Service: Cardiovascular;  Laterality: N/A;  . KNEE SURGERY    . LEFT HEART CATH AND CORONARY ANGIOGRAPHY N/A 10/30/2017   Procedure: LEFT HEART CATH AND CORONARY ANGIOGRAPHY;  Surgeon: Teodoro Spray, MD;  Location: [place] CV LAB;   Service: Cardiovascular;  Laterality: N/A;  . LEFT HEART CATH AND CORS/GRAFTS ANGIOGRAPHY N/A 10/22/2017   Procedure: LEFT HEART CATH AND CORS/GRAFTS ANGIOGRAPHY;  Surgeon: Corey Skains, MD;  Location: [place] CV LAB;  Service: Cardiovascular;  Laterality: N/A;  . quadruple bypass    . RIGHT/LEFT HEART CATH AND CORONARY/GRAFT ANGIOGRAPHY N/A 07/30/2017   Procedure: RIGHT/LEFT HEART CATH AND CORONARY/GRAFT ANGIOGRAPHY;  Surgeon: Isaias Cowman, MD;  Location: [place] CV LAB;  Service: Cardiovascular;  Laterality: N/A;  . TEE WITHOUT CARDIOVERSION  11/21/2015  . TEE WITHOUT CARDIOVERSION N/A 11/21/2015   Procedure: TRANSESOPHAGEAL ECHOCARDIOGRAM (TEE);  Surgeon: Larey Dresser, MD;  Location: [place];  Service: Cardiovascular;  Laterality: N/A;    Prior to Admission medications   Medication Sig Start Date End Date Taking? Authorizing Provider  apixaban (ELIQUIS) 5 MG TABS tablet Take 5 mg by mouth 2 (two) times daily.   Yes [provider]  atorvastatin (LIPITOR) 40 MG tablet Take 1 tablet (40 mg total) by mouth daily at 6 PM. 10/24/17  Yes Gouru, Aruna, MD  baclofen (LIORESAL) 10 MG tablet Take 10 mg by mouth 2 (two) times daily as needed for muscle spasms.    Yes [provider]  clopidogrel (PLAVIX) 75 MG tablet Take 1 tablet (75 mg total) by mouth daily with breakfast. 08/01/17  Yes Max Sane, MD  diltiazem (CARDIZEM CD) 180 MG 24 hr capsule Take 180 mg by mouth 2 (two) times daily.   Yes [provider]  DULoxetine (CYMBALTA) 20 MG capsule Take 20 mg by mouth daily.   Yes [provider]  Fluticasone-Salmeterol (ADVAIR) 250-50 MCG/DOSE AEPB Inhale 1 puff into the lungs 2 (two) times daily. 11/30/17  Yes Gladstone Lighter, MD  insulin NPH-regular Human (NOVOLIN 70/30) (70-30) 100 UNIT/ML injection Inject 85-100 Units into the skin 2 (two) times daily with a meal. 85 units in the morning and 100 units in the evening   Yes [provider]  lisinopril (PRINIVIL,ZESTRIL) 10 MG tablet Take 1 tablet (10 mg total) by mouth daily. 08/14/17  Yes Gladstone Lighter, MD  metFORMIN (GLUCOPHAGE) 1000 MG tablet Take 1,000 mg by mouth 2 (two) times daily with a meal.   Yes [provider]  metoprolol (LOPRESSOR) 100 MG tablet Take 100 mg by mouth 2 (two) times daily. 10/15/15  Yes [provider]  nitroGLYCERIN (NITROSTAT) 0.4 MG SL tablet Place 0.4 mg under the tongue every 5 (five) minutes as needed for chest pain.   Yes [provider]  pantoprazole (PROTONIX) 40 MG tablet Take 40 mg by mouth daily.   Yes [provider]  potassium chloride 20 MEQ TBCR Take 20 mEq by mouth daily. 11/23/15  Yes Shirley Friar, PA-C  potassium chloride SA (K-DUR,KLOR-CON) 20 MEQ tablet Take 1 tablet by mouth daily. 11/30/17  Yes [provider]  pramipexole (MIRAPEX) 0.5 MG tablet Take 0.5 mg by mouth 3 (three) times daily as needed.    Yes [provider]  torsemide (DEMADEX) 20 MG tablet Take 2 tablets (40 mg total) by mouth daily. 08/14/17  Yes Gladstone Lighter, MD  zolpidem Lorrin Mais) 5  MG tablet Take 1 tablet (5 mg total) by mouth at bedtime as needed for sleep. 11/01/17  Yes Fritzi Mandes, MD  albuterol (PROVENTIL HFA;VENTOLIN HFA) 108 (90 Base) MCG/ACT inhaler Inhale 2 puffs into the lungs every 6 (six) hours as needed for wheezing or shortness of breath. 07/11/15   Flora Lipps, MD    Allergies Contrast media [iodinated diagnostic agents] and Versed [midazolam]  Family History  Problem Relation Age of Onset  . Stroke Mother   . Heart attack Mother   . Hypertension Mother   . Heart attack Father   . Hypertension Father   . Heart attack Brother        #1  . Diabetes Brother        #1  . Heart disease Brother        #2  . Lung disease Brother        #2  . Hypertension Brother        #2  . Diabetes Brother        #2    Social History Social History   Tobacco Use  .  Smoking status: Never Smoker  . Smokeless tobacco: Never Used  Substance Use Topics  . Alcohol use: No  . Drug use: No    Review of Systems Constitutional: No fever/chills Eyes: No visual changes. ENT: No sore throat. Cardiovascular: Positive for chest pain. Respiratory: Positive for shortness of breath. Gastrointestinal: No abdominal pain.  Positive for nausea, no vomiting.  No diarrhea.  No constipation. Genitourinary: Negative for dysuria. Musculoskeletal: Negative for back pain. Skin: Negative for rash. Neurological: Positive for headache   ____________________________________________   PHYSICAL EXAM:  VITAL SIGNS: ED Triage Vitals  Enc Vitals Group     BP      Pulse      Resp      Temp      Temp src      SpO2      Weight      Height      Head Circumference      Peak Flow      Pain Score      Pain Loc      Pain Edu?      Excl. in [place]?     Constitutional: Alert and oriented x4 appears uncomfortable holding his left chest Eyes: PERRL EOMI. Head: Atraumatic. Nose: No congestion/rhinnorhea. Mouth/Throat: No trismus Neck: No stridor.   Cardiovascular: Normal rate, regular rhythm. Grossly normal heart sounds.  Good peripheral circulation.  Sternotomy scar well-healed Respiratory: Slightly increased respiratory effort.  No retractions. Lungs CTAB and moving good air Gastrointestinal: Obese soft nontender Musculoskeletal: No lower extremity edema   Neurologic:  Normal speech and language. No gross focal neurologic deficits are appreciated. Skin:  Skin is warm, dry and intact. No rash noted. Psychiatric: Mildly anxious appearing   ____________________________________________   DIFFERENTIAL includes but not limited to  STEMI, non-STEMI, aortic dissection, pulmonary embolism, in-stent rethrombosis ____________________________________________   LABS (all labs ordered are listed, but only abnormal results are displayed)  Labs Reviewed  COMPREHENSIVE  METABOLIC PANEL - Abnormal; Notable for the following components:      Result Value   Glucose, Bld 283 (*)    All other components within normal limits  TROPONIN I - Abnormal; Notable for the following components:   Troponin I 0.05 (*)    All other components within normal limits  BRAIN NATRIURETIC PEPTIDE - Abnormal; Notable for the following components:   B Natriuretic Peptide  446.0 (*)    All other components within normal limits  CBC WITH DIFFERENTIAL/PLATELET - Abnormal; Notable for the following components:   RBC 3.97 (*)    Hemoglobin 12.0 (*)    HCT 34.5 (*)    RDW 15.4 (*)    All other components within normal limits  APTT  PROTIME-INR  HEPARIN LEVEL (UNFRACTIONATED)  APTT  HEMOGLOBIN A1C  TROPONIN I  TROPONIN I  BASIC METABOLIC PANEL  CBC    Lab work reviewed by me with elevated troponin and BNP which are likely secondary to primary myocardial ischemia __________________________________________  EKG  ED ECG REPORT I, Darel Hong, the attending physician, personally viewed and interpreted this ECG.  Date: 12/03/2017 EKG Time: 1950 Rate: 76 Rhythm: normal sinus rhythm QRS Axis: normal Intervals: normal ST/T Wave abnormalities: ST elevation in aVR and concave up ST elevation in V1 with ST depression V2 V3 V4 V5 Narrative Interpretation: Does not meet STEMI criteria but is concerning for diffuse ischemia   ED ECG REPORT I, Darel Hong, the attending physician, personally viewed and interpreted this ECG.  Date: 12/03/2017 EKG Time: 2002 Rate: 80 Rhythm: normal sinus rhythm QRS Axis: normal Intervals: normal ST/T Wave abnormalities: Unchanged ST changes from before Narrative Interpretation: Remains consistent with diffuse ischemia although not STEMI  ____________________________________________  RADIOLOGY  Chest x-ray reviewed by me as no acute disease ____________________________________________   PROCEDURES  Procedure(s) performed:  no  .Critical Care Performed by: Darel Hong, MD Authorized by: Darel Hong, MD   Critical care provider statement:    Critical care time (minutes):  30   Critical care time was exclusive of:  Separately billable procedures and treating other patients   Critical care was necessary to treat or prevent imminent or life-threatening deterioration of the following conditions:  Cardiac failure   Critical care was time spent personally by me on the following activities:  Development of treatment plan with patient or surrogate, discussions with consultants, evaluation of patient's response to treatment, examination of patient, obtaining history from patient or surrogate, ordering and performing treatments and interventions, ordering and review of laboratory studies, ordering and review of radiographic studies, pulse oximetry, re-evaluation of patient's condition and review of old charts    Critical Care performed: Yes  Observation: no ____________________________________________   INITIAL IMPRESSION / ASSESSMENT AND PLAN / ED COURSE  Pertinent labs & imaging results that were available during my care of the patient were reviewed by me and considered in my medical decision making (see chart for details).  The patient arrives from triage uncomfortable appearing holding his left chest with a significant cardiac history.  First EKG was interpreted by nursing staff as a STEMI however while it is certainly different from his EKG performed several days ago the ST elevation is minimal and aVR and I do not believe this meet STEMI criteria.  I discussed the case with on-call cardiologist Dr. Josefa Half including reviewing the EKGs and he agrees with inpatient admission for rule out with a heparin drip and to defer activation of the Cath Lab at this time.  IV nitroglycerin given for the patient's chest pain as well as IV Compazine for his headache.  The patient verbalizes understanding and agreement with  the plan.  I recommended a single dose of aspirin to the patient is a has a mortality benefit however he declined.  I then discussed with the hospitalist who has graciously agreed to admit the patient to his service.      ____________________________________________  FINAL CLINICAL IMPRESSION(S) / ED DIAGNOSES  Final diagnoses:  ACS (acute coronary syndrome) (HCC)  NSTEMI (non-ST elevated myocardial infarction) ([place])      NEW MEDICATIONS STARTED DURING THIS VISIT:  New Prescriptions   No medications on file     Note:  This document was prepared using Dragon voice recognition software and may include unintentional dictation errors.     Darel Hong, MD 12/03/17 2153

## 2017-12-03 NOTE — ED Notes (Signed)
Admitting MD at bedside.

## 2017-12-03 NOTE — ED Notes (Signed)
Per Dr. Mable Paris pt has been taken off Nitro drip. Pt no longer has chest pain. Will continue to monitor pt. RN contacted admitting MD to update.

## 2017-12-04 ENCOUNTER — Other Ambulatory Visit: Payer: Self-pay

## 2017-12-04 LAB — CBC
HEMATOCRIT: 32.6 % — AB (ref 40.0–52.0)
HEMOGLOBIN: 11.4 g/dL — AB (ref 13.0–18.0)
MCH: 30.3 pg (ref 26.0–34.0)
MCHC: 34.9 g/dL (ref 32.0–36.0)
MCV: 87 fL (ref 80.0–100.0)
Platelets: 137 10*3/uL — ABNORMAL LOW (ref 150–440)
RBC: 3.74 MIL/uL — AB (ref 4.40–5.90)
RDW: 15.2 % — ABNORMAL HIGH (ref 11.5–14.5)
WBC: 4.9 10*3/uL (ref 3.8–10.6)

## 2017-12-04 LAB — APTT
APTT: 55 s — AB (ref 24–36)
APTT: 90 s — AB (ref 24–36)
aPTT: 69 seconds — ABNORMAL HIGH (ref 24–36)
aPTT: 65 seconds — ABNORMAL HIGH (ref 24–36)

## 2017-12-04 LAB — HEPARIN LEVEL (UNFRACTIONATED)
HEPARIN UNFRACTIONATED: 1.06 [IU]/mL — AB (ref 0.30–0.70)
Heparin Unfractionated: 1.28 IU/mL — ABNORMAL HIGH (ref 0.30–0.70)

## 2017-12-04 LAB — GLUCOSE, CAPILLARY
GLUCOSE-CAPILLARY: 150 mg/dL — AB (ref 65–99)
GLUCOSE-CAPILLARY: 168 mg/dL — AB (ref 65–99)
GLUCOSE-CAPILLARY: 216 mg/dL — AB (ref 65–99)
Glucose-Capillary: 101 mg/dL — ABNORMAL HIGH (ref 65–99)

## 2017-12-04 LAB — BASIC METABOLIC PANEL
ANION GAP: 9 (ref 5–15)
BUN: 17 mg/dL (ref 6–20)
CHLORIDE: 107 mmol/L (ref 101–111)
CO2: 26 mmol/L (ref 22–32)
Calcium: 8.8 mg/dL — ABNORMAL LOW (ref 8.9–10.3)
Creatinine, Ser: 0.77 mg/dL (ref 0.61–1.24)
GFR calc non Af Amer: >60 mL/min (ref >60)
Glucose, Bld: 84 mg/dL (ref 65–99)
POTASSIUM: 3.3 mmol/L — AB (ref 3.5–5.1)
Sodium: 142 mmol/L (ref 135–145)

## 2017-12-04 LAB — TROPONIN I
Troponin I: 0.18 ng/mL (ref <0.03)
Troponin I: 0.39 ng/mL (ref <0.03)

## 2017-12-04 LAB — HEMOGLOBIN A1C
Hgb A1c MFr Bld: 7.5 % — ABNORMAL HIGH (ref 4.8–5.6)
MEAN PLASMA GLUCOSE: 168.55 mg/dL

## 2017-12-04 MED ORDER — DILTIAZEM HCL ER COATED BEADS 180 MG PO CP24
180.0000 mg | ORAL_CAPSULE | Freq: Two times a day (BID) | ORAL | Status: DC
  Administered 2017-12-04 – 2017-12-05 (×4): 180 mg via ORAL
  Filled 2017-12-04 (×4): qty 1

## 2017-12-04 MED ORDER — ISOSORBIDE MONONITRATE ER 30 MG PO TB24
30.0000 mg | ORAL_TABLET | Freq: Every day | ORAL | Status: DC
  Administered 2017-12-04 – 2017-12-05 (×2): 30 mg via ORAL
  Filled 2017-12-04 (×2): qty 1

## 2017-12-04 MED ORDER — POTASSIUM CHLORIDE CRYS ER 20 MEQ PO TBCR
20.0000 meq | EXTENDED_RELEASE_TABLET | Freq: Every day | ORAL | Status: DC
Start: 2017-12-04 — End: 2017-12-05
  Administered 2017-12-04 – 2017-12-05 (×2): 20 meq via ORAL
  Filled 2017-12-04 (×2): qty 1

## 2017-12-04 MED ORDER — DULOXETINE HCL 20 MG PO CPEP
20.0000 mg | ORAL_CAPSULE | Freq: Every day | ORAL | Status: DC
Start: 2017-12-04 — End: 2017-12-05
  Administered 2017-12-04 – 2017-12-05 (×2): 20 mg via ORAL
  Filled 2017-12-04 (×2): qty 1

## 2017-12-04 MED ORDER — MOMETASONE FURO-FORMOTEROL FUM 200-5 MCG/ACT IN AERO
2.0000 | INHALATION_SPRAY | Freq: Two times a day (BID) | RESPIRATORY_TRACT | Status: DC
  Administered 2017-12-04 – 2017-12-05 (×3): 2 via RESPIRATORY_TRACT
  Filled 2017-12-04: qty 8.8

## 2017-12-04 MED ORDER — ZOLPIDEM TARTRATE 5 MG PO TABS
5.0000 mg | ORAL_TABLET | Freq: Every evening | ORAL | Status: DC | PRN
  Administered 2017-12-05: 5 mg via ORAL
  Filled 2017-12-04: qty 1

## 2017-12-04 MED ORDER — LISINOPRIL 10 MG PO TABS
10.0000 mg | ORAL_TABLET | Freq: Every day | ORAL | Status: DC
  Administered 2017-12-04 – 2017-12-05 (×2): 10 mg via ORAL
  Filled 2017-12-04 (×3): qty 1

## 2017-12-04 MED ORDER — INSULIN ASPART PROT & ASPART (70-30 MIX) 100 UNIT/ML ~~LOC~~ SUSP
50.0000 [IU] | Freq: Two times a day (BID) | SUBCUTANEOUS | Status: DC
  Administered 2017-12-04 – 2017-12-05 (×3): 50 [IU] via SUBCUTANEOUS
  Filled 2017-12-04 (×3): qty 1

## 2017-12-04 MED ORDER — ATORVASTATIN CALCIUM 20 MG PO TABS
40.0000 mg | ORAL_TABLET | Freq: Every day | ORAL | Status: DC
  Administered 2017-12-04: 40 mg via ORAL
  Filled 2017-12-04: qty 2

## 2017-12-04 MED ORDER — PANTOPRAZOLE SODIUM 40 MG PO TBEC
40.0000 mg | DELAYED_RELEASE_TABLET | Freq: Two times a day (BID) | ORAL | Status: DC
  Administered 2017-12-04 – 2017-12-05 (×2): 40 mg via ORAL
  Filled 2017-12-04 (×2): qty 1

## 2017-12-04 MED ORDER — CLOPIDOGREL BISULFATE 75 MG PO TABS
75.0000 mg | ORAL_TABLET | Freq: Every day | ORAL | Status: DC
  Administered 2017-12-04 – 2017-12-05 (×2): 75 mg via ORAL
  Filled 2017-12-04 (×2): qty 1

## 2017-12-04 MED ORDER — HEPARIN BOLUS VIA INFUSION
2850.0000 [IU] | Freq: Once | INTRAVENOUS | Status: AC
  Administered 2017-12-04: 2850 [IU] via INTRAVENOUS
  Filled 2017-12-04: qty 2850

## 2017-12-04 MED ORDER — METOPROLOL TARTRATE 50 MG PO TABS
100.0000 mg | ORAL_TABLET | Freq: Two times a day (BID) | ORAL | Status: DC
  Administered 2017-12-04 – 2017-12-05 (×2): 100 mg via ORAL
  Filled 2017-12-04 (×3): qty 2

## 2017-12-04 MED ORDER — BACLOFEN 10 MG PO TABS
10.0000 mg | ORAL_TABLET | Freq: Two times a day (BID) | ORAL | Status: DC | PRN
  Filled 2017-12-04: qty 1

## 2017-12-04 MED ORDER — PANTOPRAZOLE SODIUM 40 MG PO TBEC
40.0000 mg | DELAYED_RELEASE_TABLET | Freq: Every day | ORAL | Status: DC
  Administered 2017-12-04: 40 mg via ORAL
  Filled 2017-12-04: qty 1

## 2017-12-04 NOTE — Progress Notes (Addendum)
Lab called for a critical value of troponin 0.39. Page [facility]. Awaiting callback. Will continue to monitor.   Update 0727: Charge nurse made aware that [facility] has not call back yet. Will continue to monitor.

## 2017-12-04 NOTE — Plan of Care (Signed)
  Problem: Health Behavior/Discharge Planning: Goal: Ability to manage health-related needs will improve Outcome: Progressing   Problem: Pain Managment: Goal: General experience of comfort will improve Outcome: Progressing   Problem: Safety: Goal: Ability to remain free from injury will improve Outcome: Progressing   

## 2017-12-04 NOTE — Progress Notes (Signed)
[facility] [place] NAME: Marcus Williams    MR#:  [id number]  DATE OF BIRTH:  [birth year]  SUBJECTIVE:  CHIEF COMPLAINT:  No chief complaint on file.  -Is much better.  Troponin improving.  On heparin drip -No further chest pain  REVIEW OF SYSTEMS:  Review of Systems  Constitutional: Negative for chills and fever.  HENT: Negative for congestion, ear discharge, hearing loss and nosebleeds.   Eyes: Negative for blurred vision and double vision.  Respiratory: Negative for cough, shortness of breath and wheezing.   Cardiovascular: Positive for chest pain. Negative for palpitations.  Gastrointestinal: Negative for abdominal pain, constipation, diarrhea, nausea and vomiting.  Genitourinary: Negative for dysuria.  Musculoskeletal: Negative for myalgias.  Neurological: Negative for dizziness, focal weakness, seizures, weakness and headaches.  Psychiatric/Behavioral: Negative for depression.    DRUG ALLERGIES:   Allergies  Allergen Reactions  . Contrast Media [Iodinated Diagnostic Agents] Shortness Of Breath    Hypoxia/hypotension  . Versed [Midazolam] Other (See Comments)    Hypotension/hypoxia    VITALS:  Blood pressure 136/69, pulse 71, temperature 97.8 F (36.6 C), temperature source Oral, resp. rate 18, height 5\' 7"  (1.702 m), weight 125.2 kg (276 lb), SpO2 98 %.  PHYSICAL EXAMINATION:  Physical Exam  GENERAL:  62 y.o.-year-old obese patient lying in the bed with no acute distress.  EYES: Pupils equal, round, reactive to light and accommodation. No scleral icterus. Extraocular muscles intact.  HEENT: Head atraumatic, normocephalic. Oropharynx and nasopharynx clear.  NECK:  Supple, no jugular venous distention. No thyroid enlargement, no tenderness.  LUNGS: Normal breath sounds bilaterally, no wheezing, rales,rhonchi or crepitation. No use of accessory muscles of respiration.  CARDIOVASCULAR: S1, S2 normal. No murmurs, rubs, or  gallops.  ABDOMEN: Soft, nontender, nondistended. Bowel sounds present. No organomegaly or mass.  EXTREMITIES: No pedal edema, cyanosis, or clubbing.  NEUROLOGIC: Cranial nerves II through XII are intact. Muscle strength 5/5 in all extremities. Sensation intact. Gait not checked.  PSYCHIATRIC: The patient is alert and oriented x 3.  SKIN: No obvious rash, lesion, or ulcer.    LABORATORY PANEL:   CBC Recent Labs  Lab 12/04/17 0604  WBC 4.9  HGB 11.4*  HCT 32.6*  PLT 137*   ------------------------------------------------------------------------------------------------------------------  Chemistries  Recent Labs  Lab 12/03/17 2000 12/04/17 0604  NA 139 142  K 3.6 3.3*  CL 106 107  CO2 22 26  GLUCOSE 283* 84  BUN 17 17  CREATININE 1.00 0.77  CALCIUM 9.0 8.8*  AST 33  --   ALT 31  --   ALKPHOS 54  --   BILITOT 0.5  --    ------------------------------------------------------------------------------------------------------------------  Cardiac Enzymes Recent Labs  Lab 12/04/17 0604  TROPONINI 0.39*   ------------------------------------------------------------------------------------------------------------------  RADIOLOGY:  Dg Chest Port 1 View  Result Date: 12/03/2017 CLINICAL DATA:  STEMI EXAM: PORTABLE CHEST 1 VIEW COMPARISON:  11/29/2017, 10/30/2017 FINDINGS: Post sternotomy changes with sternal wire fractures as before. Cardiomegaly with vascular congestion. No acute airspace disease, pleural effusion or pneumothorax. IMPRESSION: No active disease.  Cardiomegaly with mild vascular congestion Electronically Signed   By: Donavan Foil M.D.   On: 12/03/2017 20:21    EKG:   Orders placed or performed during the hospital encounter of 12/03/17  . EKG 12-Lead  . EKG 12-Lead  . EKG 12-Lead  . EKG 12-Lead    ASSESSMENT AND PLAN:   62 year old obese male with past medical history significant for CAD with occlusive disease  status post CABG, OSA, COPD oxygen,  chronic A. fib on Eliquis presents to hospital secondary to shortness of breath.  1. NSTEMI-significant cardiac history.  Status post bypass surgery.  PCI in May 2019 and a repeat cath after that for chest pain.  Chronic blockages. -Came in with chest pain, troponin is elevated. -Eliquis has been held.   -Appreciate cardiology consult.  Started on heparin drip. -If recurring chest pain, possible cardiac cath on Monday.  Patient hoping to go home tomorrow. -Continue cardiac medications.  Patient on Plavix, lisinopril, metoprolol and statin. -We will add Imdur or Ranexa blood pressure permits  2.  Dyspnea episodes-recent admission for dyspnea, secondary to reflux. -PPI was increased to twice daily.  No further symptoms at this time..  3.  Chronic A. fib-rate controlled.  On Cardizem. -Eliquis is currently on hold  4.  Insulin-dependent diabetes mellitus-  -NPH insulin, SSI - metformin held for cardiac cath  5.  COPD and sleep apnea-counseled about using the CPAP consistently. - advair bid, advised to use his nebs prn for dyspnea and monitor  6. DVT Prophylaxis- on heparin drip     All the records are reviewed and case discussed with Care Management/Social Workerr. Management plans discussed with the patient, family and they are in agreement.  CODE STATUS: Full Code  TOTAL TIME TAKING CARE OF THIS PATIENT: 38 minutes.   POSSIBLE D/C IN 1-2 DAYS, DEPENDING ON CLINICAL CONDITION.   Gladstone Lighter M.D on 12/04/2017 at 12:03 PM  Between 7am to 6pm - Pager - [phone]  After 6pm go to [email or web address] - password [email or web address] [facility]  Office  [phone]  CC: Primary care physician; Idelle Crouch, MD

## 2017-12-04 NOTE — Progress Notes (Signed)
ANTICOAGULATION CONSULT NOTE - Follow up [facility] for heparin gtt Indication: chest pain/ACS  Allergies  Allergen Reactions  . Contrast Media [Iodinated Diagnostic Agents] Shortness Of Breath    Hypoxia/hypotension  . Versed [Midazolam] Other (See Comments)    Hypotension/hypoxia    Patient Measurements: Height: 5\' 7"  (170.2 cm) Weight: 276 lb (125.2 kg) IBW/kg (Calculated) : 66.1 Heparin Dosing Weight: 95.1kg  Vital Signs: Temp: 97.8 F (36.6 C) (06/21 0756) Temp Source: Oral (06/21 0756) BP: 136/69 (06/21 0756) Pulse Rate: 71 (06/21 0756)  Labs: Recent Labs    12/03/17 2000 12/03/17 2001 12/04/17 0022 12/04/17 0226 12/04/17 0604  HGB 12.0*  --   --   --  11.4*  HCT 34.5*  --   --   --  32.6*  PLT 155  --   --   --  137*  APTT  --  36  --  69* 65*  LABPROT  --  12.9  --   --   --   INR  --  0.98  --   --   --   HEPARINUNFRC  --   --   --  1.28* 1.06*  CREATININE 1.00  --   --   --  0.77  TROPONINI 0.05*  --  0.18*  --  0.39*    Estimated Creatinine Clearance: 121.5 mL/min (by C-G formula based on SCr of 0.77 mg/dL).    Assessment: 62 year old male with ACS requiring anticoagulation with heparin gtt per pharmacy consult. Will need to monitor aPTT until correlates with HL because pt prior on eliquis.   Goal of Therapy:  Heparin level 0.3-0.7 units/ml Monitor platelets by anticoagulation protocol: Yes  aPTT 66-109 seconds   Plan:  06/21 @ 0230 HL 1.28, aPTT 69 seconds. APTT therapeutic. Will continue current rate of 1000 units/hr and will recheck aPTT/HL @ 0800. CBC f/u w/ am labs.  6/21 at 0604 HL 1.06, aPTT 65 s. APTT subtherapeutic. No s/sx of bleeding or line issues per RN.  Will increase heparin drip to 1100 units/hr (=11 ml/hr). Recheck aPTT in 6h, CBC and HL in AM.    Rayna Sexton, PharmD, BCPS Clinical Pharmacist 12/04/2017 9:19 AM

## 2017-12-04 NOTE — Progress Notes (Signed)
ANTICOAGULATION CONSULT NOTE - Follow up [facility] for heparin gtt Indication: chest pain/ACS  Allergies  Allergen Reactions  . Contrast Media [Iodinated Diagnostic Agents] Shortness Of Breath    Hypoxia/hypotension  . Versed [Midazolam] Other (See Comments)    Hypotension/hypoxia    Patient Measurements: Height: 5\' 7"  (170.2 cm) Weight: 276 lb (125.2 kg) IBW/kg (Calculated) : 66.1 Heparin Dosing Weight: 95.1kg  Vital Signs: Temp: 98.3 F (36.8 C) (06/21 1938) Temp Source: Oral (06/21 1938) BP: 128/60 (06/21 1938) Pulse Rate: 56 (06/21 1938)  Labs: Recent Labs    12/03/17 2000  12/03/17 2001 12/04/17 0022 12/04/17 0226 12/04/17 0604 12/04/17 1600 12/04/17 2234  HGB 12.0*  --   --   --   --  11.4*  --   --   HCT 34.5*  --   --   --   --  32.6*  --   --   PLT 155  --   --   --   --  137*  --   --   APTT  --    < > 36  --  69* 65* 55* 90*  LABPROT  --   --  12.9  --   --   --   --   --   INR  --   --  0.98  --   --   --   --   --   HEPARINUNFRC  --   --   --   --  1.28* 1.06*  --   --   CREATININE 1.00  --   --   --   --  0.77  --   --   TROPONINI 0.05*  --   --  0.18*  --  0.39*  --   --    < > = values in this interval not displayed.    Estimated Creatinine Clearance: 121.5 mL/min (by C-G formula based on SCr of 0.77 mg/dL).    Assessment: 62 year old male with ACS requiring anticoagulation with heparin gtt per pharmacy consult. Will need to monitor aPTT until correlates with HL because pt prior on eliquis.   Goal of Therapy:  Heparin level 0.3-0.7 units/ml Monitor platelets by anticoagulation protocol: Yes  aPTT 66-109 seconds   Plan:  06/21 @ 2234 aPTT 90 seconds. Therapeutic. Will continue current rate and will recheck HL/aPTT w/ am labs.  Tobie Lords, PharmD, BCPS Clinical Pharmacist 12/04/2017

## 2017-12-04 NOTE — Care Management (Addendum)
Patient with discharge earlier this week from observation stay for chest pain.  Presents back with chest pain and was placed on nitro and heparin drip .  For cardiac cath.  made [facility] referral. Agreeable to home health. Agency preference [facility] as agency is following his wife. heads up referral for SN to follow.

## 2017-12-04 NOTE — Progress Notes (Signed)
ANTICOAGULATION CONSULT NOTE - Initial Consult  Pharmacy Consult for heparin gtt Indication: chest pain/ACS  Allergies  Allergen Reactions  . Contrast Media [Iodinated Diagnostic Agents] Shortness Of Breath    Hypoxia/hypotension  . Versed [Midazolam] Other (See Comments)    Hypotension/hypoxia    Patient Measurements: Height: 5\' 7"  (170.2 cm) Weight: 276 lb 8 oz (125.4 kg) IBW/kg (Calculated) : 66.1 Heparin Dosing Weight: 95.1kg  Vital Signs: Temp: 98 F (36.7 C) (06/21 0252) Temp Source: Oral (06/20 2357) BP: 148/63 (06/21 0252) Pulse Rate: 64 (06/21 0252)  Labs: Recent Labs    12/03/17 2000 12/03/17 2001 12/04/17 0022 12/04/17 0226  HGB 12.0*  --   --   --   HCT 34.5*  --   --   --   PLT 155  --   --   --   APTT  --  36  --  69*  LABPROT  --  12.9  --   --   INR  --  0.98  --   --   HEPARINUNFRC  --   --   --  1.28*  CREATININE 1.00  --   --   --   TROPONINI 0.05*  --  0.18*  --     Estimated Creatinine Clearance: 97.3 mL/min (by C-G formula based on SCr of 1 mg/dL).   Medical History: Past Medical History:  Diagnosis Date  . A-fib ([place])   . Aortic stenosis, moderate 11/2015  . Arthritis   . CAD (coronary artery disease)   . Chronic systolic CHF (congestive heart failure) ([place])   . Diabetes mellitus without complication ([place])    INSULIN DEPENDENT  . GERD (gastroesophageal reflux disease)   . History of cardioversion   . Hypertension   . MI (myocardial infarction) ([place])   . OSA on CPAP   . Sepsis ([place])   . Shortness of breath dyspnea   . Stroke ([place])   . SVT (supraventricular tachycardia) (HCC)     Medications:  Medications Prior to Admission  Medication Sig Dispense Refill Last Dose  . apixaban (ELIQUIS) 5 MG TABS tablet Take 5 mg by mouth 2 (two) times daily.   11/29/2017 at 0800  . atorvastatin (LIPITOR) 40 MG tablet Take 1 tablet (40 mg total) by mouth daily at 6 PM. 30 tablet 0 11/28/2017 at 1800  . baclofen (LIORESAL) 10 MG tablet Take 10  mg by mouth 2 (two) times daily as needed for muscle spasms.    PRN at PRN  . clopidogrel (PLAVIX) 75 MG tablet Take 1 tablet (75 mg total) by mouth daily with breakfast. 30 tablet 0 11/29/2017 at 0800  . diltiazem (CARDIZEM CD) 180 MG 24 hr capsule Take 180 mg by mouth 2 (two) times daily.   11/29/2017 at 0800  . DULoxetine (CYMBALTA) 20 MG capsule Take 20 mg by mouth daily.   11/29/2017 at 0800  . Fluticasone-Salmeterol (ADVAIR) 250-50 MCG/DOSE AEPB Inhale 1 puff into the lungs 2 (two) times daily. 60 each 1   . insulin NPH-regular Human (NOVOLIN 70/30) (70-30) 100 UNIT/ML injection Inject 85-100 Units into the skin 2 (two) times daily with a meal. 85 units in the morning and 100 units in the evening   11/28/2017 at Unknown time  . lisinopril (PRINIVIL,ZESTRIL) 10 MG tablet Take 1 tablet (10 mg total) by mouth daily. 30 tablet 2 11/29/2017 at 0800  . metFORMIN (GLUCOPHAGE) 1000 MG tablet Take 1,000 mg by mouth 2 (two) times daily with a meal.  11/29/2017 at 0800  . metoprolol (LOPRESSOR) 100 MG tablet Take 100 mg by mouth 2 (two) times daily.   11/29/2017 at 0800  . nitroGLYCERIN (NITROSTAT) 0.4 MG SL tablet Place 0.4 mg under the tongue every 5 (five) minutes as needed for chest pain.   PRN at PRN  . pantoprazole (PROTONIX) 40 MG tablet Take 40 mg by mouth daily.   11/29/2017 at 0800  . potassium chloride 20 MEQ TBCR Take 20 mEq by mouth daily. 34 tablet 6 11/29/2017 at 0800  . potassium chloride SA (K-DUR,KLOR-CON) 20 MEQ tablet Take 1 tablet by mouth daily.     . pramipexole (MIRAPEX) 0.5 MG tablet Take 0.5 mg by mouth 3 (three) times daily as needed.    PRN at PRN  . torsemide (DEMADEX) 20 MG tablet Take 2 tablets (40 mg total) by mouth daily. 60 tablet 2 11/29/2017 at 0800  . zolpidem (AMBIEN) 5 MG tablet Take 1 tablet (5 mg total) by mouth at bedtime as needed for sleep. 10 tablet 0 PRN at PRN  . albuterol (PROVENTIL HFA;VENTOLIN HFA) 108 (90 Base) MCG/ACT inhaler Inhale 2 puffs into the lungs every 6  (six) hours as needed for wheezing or shortness of breath. 1 Inhaler 10 PRN at PRN   Scheduled:  . atorvastatin  40 mg Oral q1800  . clopidogrel  75 mg Oral Q breakfast  . diltiazem  180 mg Oral BID  . DULoxetine  20 mg Oral Daily  . furosemide  60 mg Intravenous Daily  . insulin aspart  0-15 Units Subcutaneous TID WC  . insulin aspart  0-5 Units Subcutaneous QHS  . insulin aspart protamine- aspart  50 Units Subcutaneous BID WC  . lisinopril  10 mg Oral Daily  . metoprolol tartrate  100 mg Oral BID  . mometasone-formoterol  2 puff Inhalation BID  . pantoprazole  40 mg Oral Daily  . potassium chloride SA  20 mEq Oral Daily  . sodium chloride flush  3 mL Intravenous Q12H   Infusions:  . heparin 1,000 Units/hr (12/03/17 2131)   PRN:  Anti-infectives (From admission, onward)   None      Assessment: 62 year old male with ACS requiring anticoagulation with heparin gtt per pharmacy consult. Will need to monitor aPTT until correlates with HL because pt prior on eliquis.   Goal of Therapy:  Heparin level 0.3-0.7 units/ml Monitor platelets by anticoagulation protocol: Yes  aPTT 66-109 seconds   Plan:  06/21 @ 0230 HL 1.28, aPTT 69 seconds. APTT therapeutic. Will continue current rate of 1000 units/hr and will recheck aPTT/HL @ 0800. CBC f/u w/ am labs.  Tobie Lords, PharmD, BCPS Clinical Pharmacist 12/04/2017

## 2017-12-04 NOTE — Progress Notes (Signed)
ANTICOAGULATION CONSULT NOTE - Follow up Marcus Williams for heparin gtt Indication: chest pain/ACS  Allergies  Allergen Reactions  . Contrast Media [Iodinated Diagnostic Agents] Shortness Of Breath    Hypoxia/hypotension  . Versed [Midazolam] Other (See Comments)    Hypotension/hypoxia    Patient Measurements: Height: 5\' 7"  (170.2 cm) Weight: 276 lb (125.2 kg) IBW/kg (Calculated) : 66.1 Heparin Dosing Weight: 95.1kg  Vital Signs: Temp: 97.8 F (36.6 C) (06/21 0756) Temp Source: Oral (06/21 0756) BP: 136/69 (06/21 0756) Pulse Rate: 71 (06/21 0756)  Labs: Recent Labs    12/03/17 2000  12/03/17 2001 12/04/17 0022 12/04/17 0226 12/04/17 0604 12/04/17 1600  HGB 12.0*  --   --   --   --  11.4*  --   HCT 34.5*  --   --   --   --  32.6*  --   PLT 155  --   --   --   --  137*  --   APTT  --    < > 36  --  69* 65* 55*  LABPROT  --   --  12.9  --   --   --   --   INR  --   --  0.98  --   --   --   --   HEPARINUNFRC  --   --   --   --  1.28* 1.06*  --   CREATININE 1.00  --   --   --   --  0.77  --   TROPONINI 0.05*  --   --  0.18*  --  0.39*  --    < > = values in this interval not displayed.    Estimated Creatinine Clearance: 121.5 mL/min (by C-G formula based on SCr of 0.77 mg/dL).    Assessment: 62 year old male with ACS requiring anticoagulation with heparin gtt per pharmacy consult. Will need to monitor aPTT until correlates with HL because pt prior on eliquis.   Goal of Therapy:  Heparin level 0.3-0.7 units/ml Monitor platelets by anticoagulation protocol: Yes  aPTT 66-109 seconds   Plan:  06/21 1600 APTT low at 55 dispite a rate increase. Confirmed with RN that drip has been running and was not turned off. I will bolus 2850 units and increase the rate to 1450 units/hr. Check APTT in 6 hours. Will need to check another HL tomorrow  Marcus Williams, Pharm.D, BCPS Clinical Pharmacist  12/04/2017 4:37 PM

## 2017-12-04 NOTE — Consult Note (Signed)
[facility]  CARDIOLOGY CONSULT NOTE  Patient ID: Marcus Williams MRN: [id number] DOB/AGE: [birth year] 62 y.o.  Admit date: 12/03/2017 Referring Physician [place] Primary Physician [place] Primary Cardiologist Nehemiah Massed Reason for Consultation chest pain  HPI: 62 year old gentleman referred for evaluation of chest pain.  She has known coronary disease, status post CABG approximately 12 years ago at [facility].  He underwent cardiac catheterization 10/30/2017 which revealed three-vessel coronary artery disease, patent LIMA to LAD, patent SVG to D1, patent SVG to OM 2, and high-grade stenosis distal SVG to RCA.  She underwent DES distal SVG.  The patient experienced recurrent chest pain, underwent repeat cardiac catheterization 10/23/2017, which revealed patent bypass grafts, and patent stent distal SVG to RCA.  The patient presented to [facility] emergency room yesterday, with recurrent chest pain, which was more mild in severity compared to 10/30/2017.  ECG revealed T wave abnormalities leads I and aVL, but overall appeared improved compared to ECG from 10/30/2017.  Labs notable for mildly elevated troponin of 0.18 and 0.39.  Patient currently denies chest pain.  Patient has atrial fibrillation, on Eliquis which is now being held.  Review of systems complete and found to be negative unless listed above     Past Medical History:  Diagnosis Date  . A-fib ([place])   . Aortic stenosis, moderate 11/2015  . Arthritis   . CAD (coronary artery disease)   . Chronic systolic CHF (congestive heart failure) ([place])   . Diabetes mellitus without complication ([place])    INSULIN DEPENDENT  . GERD (gastroesophageal reflux disease)   . History of cardioversion   . Hypertension   . MI (myocardial infarction) ([place])   . OSA on CPAP   . Sepsis ([place])   . Shortness of breath dyspnea   . Stroke ([place])   . SVT (supraventricular tachycardia) (HCC)     Past Surgical History:  Procedure Laterality Date  . 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 BUNIONECTOMY    . CARDIAC CATHETERIZATION N/A 10/02/2015   Procedure: Coronary/Grafts Angiography;  Surgeon: Teodoro Spray, MD;  Location: [place] CV LAB;  Service: Cardiovascular;  Laterality: N/A;  . CARDIAC CATHETERIZATION N/A 11/21/2015   Procedure: Right and Left Heart Cath;  Surgeon: Sherren Mocha, MD;  Location: [place] CV LAB;  Service: Cardiovascular;  Laterality: N/A;  . CORONARY ARTERY BYPASS GRAFT    . CORONARY STENT INTERVENTION N/A 07/30/2017   Procedure: CORONARY STENT INTERVENTION;  Surgeon: Isaias Cowman, MD;  Location: [place] CV LAB;  Service: Cardiovascular;  Laterality: N/A;  . CORONARY STENT INTERVENTION N/A 10/23/2017   Procedure: CORONARY STENT INTERVENTION;  Surgeon: Yolonda Kida, MD;  Location: [place] CV LAB;  Service: Cardiovascular;  Laterality: N/A;  . KNEE SURGERY    . LEFT HEART CATH AND CORONARY ANGIOGRAPHY N/A 10/30/2017   Procedure: LEFT HEART CATH AND CORONARY ANGIOGRAPHY;  Surgeon: Teodoro Spray, MD;  Location: [place] CV LAB;  Service: Cardiovascular;  Laterality: N/A;  . LEFT HEART CATH AND CORS/GRAFTS ANGIOGRAPHY N/A 10/22/2017   Procedure: LEFT HEART CATH AND CORS/GRAFTS ANGIOGRAPHY;  Surgeon: Corey Skains, MD;  Location: [place] CV LAB;  Service: Cardiovascular;  Laterality: N/A;  . quadruple bypass    . RIGHT/LEFT HEART CATH AND CORONARY/GRAFT ANGIOGRAPHY N/A 07/30/2017   Procedure: RIGHT/LEFT HEART CATH AND CORONARY/GRAFT ANGIOGRAPHY;  Surgeon: Isaias Cowman, MD;  Location: [place] CV  LAB;  Service: Cardiovascular;  Laterality: N/A;  . TEE WITHOUT CARDIOVERSION  11/21/2015  . TEE WITHOUT CARDIOVERSION N/A 11/21/2015   Procedure: TRANSESOPHAGEAL ECHOCARDIOGRAM (TEE);  Surgeon: Larey Dresser, MD;  Location: [place];  Service: Cardiovascular;  Laterality: N/A;     Medications Prior to Admission  Medication Sig Dispense Refill Last Dose  . apixaban (ELIQUIS) 5 MG TABS tablet Take 5 mg by mouth 2 (two) times daily.   11/29/2017 at 0800  . atorvastatin (LIPITOR) 40 MG tablet Take 1 tablet (40 mg total) by mouth daily at 6 PM. 30 tablet 0 11/28/2017 at 1800  . baclofen (LIORESAL) 10 MG tablet Take 10 mg by mouth 2 (two) times daily as needed for muscle spasms.    PRN at PRN  . clopidogrel (PLAVIX) 75 MG tablet Take 1 tablet (75 mg total) by mouth daily with breakfast. 30 tablet 0 11/29/2017 at 0800  . diltiazem (CARDIZEM CD) 180 MG 24 hr capsule Take 180 mg by mouth 2 (two) times daily.   11/29/2017 at 0800  . DULoxetine (CYMBALTA) 20 MG capsule Take 20 mg by mouth daily.   11/29/2017 at 0800  . Fluticasone-Salmeterol (ADVAIR) 250-50 MCG/DOSE AEPB Inhale 1 puff into the lungs 2 (two) times daily. 60 each 1   . insulin NPH-regular Human (NOVOLIN 70/30) (70-30) 100 UNIT/ML injection Inject 85-100 Units into the skin 2 (two) times daily with a meal. 85 units in the morning and 100 units in the evening   11/28/2017 at Unknown time  . lisinopril (PRINIVIL,ZESTRIL) 10 MG tablet Take 1 tablet (10 mg total) by mouth daily. 30 tablet 2 11/29/2017 at 0800  . metFORMIN (GLUCOPHAGE) 1000 MG tablet Take 1,000 mg by mouth 2 (two) times daily with a meal.   11/29/2017 at 0800  . metoprolol (LOPRESSOR) 100 MG tablet Take 100 mg by mouth 2 (two) times daily.   11/29/2017 at 0800  . nitroGLYCERIN (NITROSTAT) 0.4 MG SL tablet Place 0.4 mg under the tongue every 5 (five) minutes as needed for chest pain.   PRN at PRN  . pantoprazole (PROTONIX) 40 MG tablet Take 40 mg by mouth daily.   11/29/2017 at 0800  . potassium chloride 20 MEQ TBCR Take 20 mEq by mouth daily. 34 tablet 6 11/29/2017 at 0800  . potassium chloride SA (K-DUR,KLOR-CON) 20 MEQ tablet Take 1 tablet by mouth daily.     . pramipexole (MIRAPEX) 0.5 MG tablet Take 0.5 mg by mouth 3 (three) times daily as needed.    PRN at PRN  .  torsemide (DEMADEX) 20 MG tablet Take 2 tablets (40 mg total) by mouth daily. 60 tablet 2 11/29/2017 at 0800  . zolpidem (AMBIEN) 5 MG tablet Take 1 tablet (5 mg total) by mouth at bedtime as needed for sleep. 10 tablet 0 PRN at PRN  . albuterol (PROVENTIL HFA;VENTOLIN HFA) 108 (90 Base) MCG/ACT inhaler Inhale 2 puffs into the lungs every 6 (six) hours as needed for wheezing or shortness of breath. 1 Inhaler 10 PRN at PRN   Social History   Socioeconomic History  . Marital status: Married    Spouse name: Not on file  . Number of children: Not on file  . Years of education: Not on file  . Highest education level: Not on file  Occupational History  . Not on file  Social Needs  . Financial resource strain: Not on file  . Food insecurity:    Worry: Not on file    Inability: Not on  file  . Transportation needs:    Medical: Not on file    Non-medical: Not on file  Tobacco Use  . Smoking status: Never Smoker  . Smokeless tobacco: Never Used  Substance and Sexual Activity  . Alcohol use: No  . Drug use: No  . Sexual activity: Yes  Lifestyle  . Physical activity:    Days per week: Not on file    Minutes per session: Not on file  . Stress: Not on file  Relationships  . Social connections:    Talks on phone: Not on file    Gets together: Not on file    Attends religious service: Not on file    Active member of club or organization: Not on file    Attends meetings of clubs or organizations: Not on file    Relationship status: Not on file  . Intimate partner violence:    Fear of current or ex partner: Not on file    Emotionally abused: Not on file    Physically abused: Not on file    Forced sexual activity: Not on file  Other Topics Concern  . Not on file  Social History Narrative  . Not on file    Family History  Problem Relation Age of Onset  . Stroke Mother   . Heart attack Mother   . Hypertension Mother   . Heart attack Father   . Hypertension Father   . Heart attack  Brother        #1  . Diabetes Brother        #1  . Heart disease Brother        #2  . Lung disease Brother        #2  . Hypertension Brother        #2  . Diabetes Brother        #2      Review of systems complete and found to be negative unless listed above      PHYSICAL EXAM  General: Well developed, well nourished, in no acute distress HEENT:  Normocephalic and atramatic Neck:  No JVD.  Lungs: Clear bilaterally to auscultation and percussion. Heart: HRRR . Normal S1 and S2 without gallops or murmurs.  Abdomen: Bowel sounds are positive, abdomen soft and non-tender  Msk:  Back normal, normal gait. Normal strength and tone for age. Extremities: No clubbing, cyanosis or edema.   Neuro: Alert and oriented X 3. Psych:  Good affect, responds appropriately  Labs:   Lab Results  Component Value Date   WBC 4.9 12/04/2017   HGB 11.4 (L) 12/04/2017   HCT 32.6 (L) 12/04/2017   MCV 87.0 12/04/2017   PLT 137 (L) 12/04/2017    Recent Labs  Lab 12/03/17 2000 12/04/17 0604  NA 139 142  K 3.6 3.3*  CL 106 107  CO2 22 26  BUN 17 17  CREATININE 1.00 0.77  CALCIUM 9.0 8.8*  PROT 7.0  --   BILITOT 0.5  --   ALKPHOS 54  --   ALT 31  --   AST 33  --   GLUCOSE 283* 84   Lab Results  Component Value Date   CKTOTAL 160 08/23/2012   CKMB 5.2 (H) 10/14/2014   TROPONINI 0.39 (HH) 12/04/2017    Lab Results  Component Value Date   CHOL 136 10/29/2017   CHOL 140 10/28/2017   CHOL 142 03/01/2017   Lab Results  Component Value Date   HDL 34 (L)  10/29/2017   HDL 34 (L) 10/28/2017   HDL 31 (L) 03/01/2017   Lab Results  Component Value Date   LDLCALC 81 10/29/2017   LDLCALC 77 10/28/2017   LDLCALC 70 03/01/2017   Lab Results  Component Value Date   TRIG 104 10/29/2017   TRIG 146 10/28/2017   TRIG 206 (H) 03/01/2017   Lab Results  Component Value Date   CHOLHDL 4.0 10/29/2017   CHOLHDL 4.1 10/28/2017   CHOLHDL 4.6 03/01/2017   No results found for:  LDLDIRECT    Radiology: Dg Chest 2 View  Result Date: 11/29/2017 CLINICAL DATA:  Non smoker, increasing SOB this morning and feels like he's choking. Hx of multiple heart issues including recent stent placement, CHF and surgery at [facility] 2012 EXAM: CHEST - 2 VIEW COMPARISON:  10/30/2017 FINDINGS: Hyperinflation. Prior median sternotomy. Midline trachea. Normal heart size and mediastinal contours. Atherosclerosis in the transverse aorta. No pleural effusion or pneumothorax. Mild lower lobe predominant interstitial thickening. Mild right minor fissure thickening. IMPRESSION: No acute cardiopulmonary disease. Hyperinflation and interstitial thickening, most consistent with COPD/chronic bronchitis. Electronically Signed   By: Abigail Miyamoto M.D.   On: 11/29/2017 10:41   Ct Head Wo Contrast  Result Date: 11/29/2017 CLINICAL DATA:  Headache since last night. EXAM: CT HEAD WITHOUT CONTRAST TECHNIQUE: Contiguous axial images were obtained from the base of the skull through the vertex without intravenous contrast. COMPARISON:  Head CT scan 02/28/2017.  Brain MRI 03/05/2017. FINDINGS: Brain: No evidence of acute infarction, hemorrhage, hydrocephalus, extra-axial collection or mass lesion/mass effect. Remote right cerebellar infarct is noted. Vascular: Atherosclerotic vascular disease identified. Skull: Intact.  No focal lesion. Sinuses/Orbits: Negative. Other: None. IMPRESSION: No acute abnormality. Remote right cerebellar infarct. Atherosclerosis. Electronically Signed   By: Inge Rise M.D.   On: 11/29/2017 14:53   Dg Chest Port 1 View  Result Date: 12/03/2017 CLINICAL DATA:  STEMI EXAM: PORTABLE CHEST 1 VIEW COMPARISON:  11/29/2017, 10/30/2017 FINDINGS: Post sternotomy changes with sternal wire fractures as before. Cardiomegaly with vascular congestion. No acute airspace disease, pleural effusion or pneumothorax. IMPRESSION: No active disease.  Cardiomegaly with mild vascular congestion Electronically Signed    By: Donavan Foil M.D.   On: 12/03/2017 20:21    EKG: Sinus rhythm, ST-T wave abnormalities leads I and aVL  ASSESSMENT AND PLAN:   1.  Chest pain, mildly elevated troponin, and patient who underwent recent stent SVG to RCA, with follow-up cardiac catheterization 1 week after procedure which revealed patent stent.  Patient currently denies chest pain. 2.  Paroxysmal atrial fibrillation, on Eliquis for stroke prevention, only 2 doses have been held, would be high risk for bleeding complication if proceeded with cardiac catheterization today.  Recommendations  1.  Agree with overall current therapy 2.  Continue heparin drip for now 3.  Continue to hold Eliquis 4.  For cardiac catheterization today 5.  If patient does well today, without recurrent chest pain, without significant increase in troponin, consider discharge tomorrow.  If patient has recurrent chest pain or has significant elevation in troponin, then will proceed with cardiac catheterization on 6/124/2019.  Signed: Isaias Cowman MD,PhD, [facility] 12/04/2017, 8:02 AM

## 2017-12-04 NOTE — Consult Note (Signed)
   [facility] CM Inpatient Consult   12/04/2017  Marcus Williams Nov 04, 1955 [id number]  Coverage for Marcus Williams and Marcus Williams, [place] Liaisons: [facility] Medicare   Primary Care Provider Fulton Reek, MD [facility] Referral received from inpatient [facility] for post hospital follow up. Spoke with inpatient RNCM regarding needs and patient contact.   Patient with multiple admissions 7 in the past 6 months.  Patient with an extreme high risk score for unplanned readmission at 35 % noted. Patient was assessed for [place] [facility] for community services. Patient was previously with out reach attempts with [place] [facility], unsuccessful calls.  Chart review reveals the patient has had a recent NSTEMI with S/P CABG and currently admitted with chest pain. Call placed to patient and HIPAA verified and spoke to regarding being restarted with [facility] services. Verbal consent obtained.  Patient states his best contact number is [phone] and mid morning for the best times to call.   Of note, [facility] Care Management services does not replace or interfere with any services that are arranged by inpatient case management or social work. For additional questions or referrals please contact:  Natividad Brood, RN BSN [facility] Liaison  [phone] business mobile phone Toll free office [phone]

## 2017-12-04 NOTE — Progress Notes (Addendum)
Pt states that he uses CPAP at bedtime at home. CPAP was offered but pt refused it. Will continue to monitor.

## 2017-12-05 LAB — BASIC METABOLIC PANEL
ANION GAP: 11 (ref 5–15)
BUN: 16 mg/dL (ref 6–20)
CALCIUM: 8.9 mg/dL (ref 8.9–10.3)
CO2: 26 mmol/L (ref 22–32)
Chloride: 102 mmol/L (ref 101–111)
Creatinine, Ser: 0.75 mg/dL (ref 0.61–1.24)
GLUCOSE: 167 mg/dL — AB (ref 65–99)
Potassium: 3.7 mmol/L (ref 3.5–5.1)
SODIUM: 139 mmol/L (ref 135–145)

## 2017-12-05 LAB — CBC
HCT: 34.6 % — ABNORMAL LOW (ref 40.0–52.0)
HEMOGLOBIN: 12 g/dL — AB (ref 13.0–18.0)
MCH: 30.3 pg (ref 26.0–34.0)
MCHC: 34.7 g/dL (ref 32.0–36.0)
MCV: 87.1 fL (ref 80.0–100.0)
PLATELETS: 148 10*3/uL — AB (ref 150–440)
RBC: 3.97 MIL/uL — AB (ref 4.40–5.90)
RDW: 15.5 % — ABNORMAL HIGH (ref 11.5–14.5)
WBC: 6.6 10*3/uL (ref 3.8–10.6)

## 2017-12-05 LAB — GLUCOSE, CAPILLARY: GLUCOSE-CAPILLARY: 173 mg/dL — AB (ref 65–99)

## 2017-12-05 LAB — APTT: APTT: 108 s — AB (ref 24–36)

## 2017-12-05 LAB — HEPARIN LEVEL (UNFRACTIONATED): HEPARIN UNFRACTIONATED: 0.77 [IU]/mL — AB (ref 0.30–0.70)

## 2017-12-05 LAB — TROPONIN I: TROPONIN I: 0.17 ng/mL — AB (ref <0.03)

## 2017-12-05 MED ORDER — ISOSORBIDE MONONITRATE ER 30 MG PO TB24: 30.0000 mg | ORAL_TABLET | Freq: Every day | ORAL | 2 refills | Status: DC

## 2017-12-05 MED ORDER — PANTOPRAZOLE SODIUM 40 MG PO TBEC: 40.0000 mg | DELAYED_RELEASE_TABLET | Freq: Two times a day (BID) | ORAL | 2 refills | Status: AC

## 2017-12-05 NOTE — Plan of Care (Signed)
  Problem: Cardiac: Goal: Ability to achieve and maintain adequate cardiovascular perfusion will improve Outcome: Progressing   

## 2017-12-05 NOTE — Progress Notes (Signed)
IV and tele removed from patient. Discharge instructions given to patient. Verbalized understanding. No distress at this time. Friend to transport patient home.

## 2017-12-05 NOTE — Progress Notes (Signed)
ANTICOAGULATION CONSULT NOTE - Follow up [place] for heparin gtt Indication: chest pain/ACS  Allergies  Allergen Reactions  . Contrast Media [Iodinated Diagnostic Agents] Shortness Of Breath    Hypoxia/hypotension  . Versed [Midazolam] Other (See Comments)    Hypotension/hypoxia    Patient Measurements: Height: 5\' 7"  (170.2 cm) Weight: 275 lb 3.2 oz (124.8 kg) IBW/kg (Calculated) : 66.1 Heparin Dosing Weight: 95.1kg  Vital Signs: Temp: 97.9 F (36.6 C) (06/22 0719) Temp Source: Oral (06/22 0719) BP: 145/66 (06/22 0719) Pulse Rate: 63 (06/22 0719)  Labs: Recent Labs    12/03/17 2000  12/03/17 2001 12/04/17 0022 12/04/17 0226 12/04/17 0604 12/04/17 1600 12/04/17 2234 12/05/17 0535  HGB 12.0*  --   --   --   --  11.4*  --   --  12.0*  HCT 34.5*  --   --   --   --  32.6*  --   --  34.6*  PLT 155  --   --   --   --  137*  --   --  148*  APTT  --    < > 36  --  69* 65* 55* 90* 108*  LABPROT  --   --  12.9  --   --   --   --   --   --   INR  --   --  0.98  --   --   --   --   --   --   HEPARINUNFRC  --   --   --   --  1.28* 1.06*  --   --  0.77*  CREATININE 1.00  --   --   --   --  0.77  --   --  0.75  TROPONINI 0.05*  --   --  0.18*  --  0.39*  --   --   --    < > = values in this interval not displayed.    Estimated Creatinine Clearance: 121.3 mL/min (by C-G formula based on SCr of 0.75 mg/dL).    Assessment: 62 year old male with ACS requiring anticoagulation with heparin gtt per pharmacy consult. Will need to monitor aPTT until correlates with HL because pt prior on eliquis.   Goal of Therapy:  Heparin level 0.3-0.7 units/ml Monitor platelets by anticoagulation protocol: Yes  aPTT 66-109 seconds   Plan:  06/21 @ 2234 aPTT 90 seconds. Therapeutic. Will continue current rate and will recheck HL/aPTT w/ am labs.  06/22 @ 0500 aPTT 108 seconds therapeutic, but large jump, will decrease to 1350 units/hr and recheck aPTT/HL @ 1100.  Tobie Lords, PharmD, BCPS Clinical Pharmacist 12/05/2017

## 2017-12-05 NOTE — Discharge Summary (Signed)
[facility] [place] NAME: Marcus Williams    MR#:  [id number]  DATE OF BIRTH:  1955-07-15  DATE OF ADMISSION:  12/03/2017   ADMITTING PHYSICIAN: Hillary Bow, MD  DATE OF DISCHARGE:  12/05/17  PRIMARY CARE PHYSICIAN: Idelle Crouch, MD   ADMISSION DIAGNOSIS:   ACS (acute coronary syndrome) ([place]) [I24.9] NSTEMI (non-ST elevated myocardial infarction) ([place]) [I21.4]  DISCHARGE DIAGNOSIS:   Active Problems:   Unstable angina ([place])   SECONDARY DIAGNOSIS:   Past Medical History:  Diagnosis Date  . A-fib ([place])   . Aortic stenosis, moderate 11/2015  . Arthritis   . CAD (coronary artery disease)   . Chronic systolic CHF (congestive heart failure) ([place])   . Diabetes mellitus without complication ([place])    INSULIN DEPENDENT  . GERD (gastroesophageal reflux disease)   . History of cardioversion   . Hypertension   . MI (myocardial infarction) ([place])   . OSA on CPAP   . Sepsis ([place])   . Shortness of breath dyspnea   . Stroke ([place])   . SVT (supraventricular tachycardia) [facility])     HOSPITAL COURSE:   62 year old obese male with past medical history significant for CAD with occlusive diseasestatuspost CABG, OSA, COPDoxygen, chronic A. fib on Eliquis presents to hospital secondary to shortness of breath.  1. NSTEMI-significant cardiac history.  Status post bypass surgery.  PCI in May 2019 and a repeat cath after that for chest pain.  Chronic blockages. -Came in with chest pain, troponin is elevated.  So Eliquis was held and patient was started on heparin drip. -However chest pain resolved completely and his troponin started trending down. -Appreciate cardiology consult.  -Cardiology recommended outpatient follow-up at this time.  No plans for any cardiac catheterization. -Continue cardiac medications.  Patient on Plavix, lisinopril, metoprolol and statin. -Imdur has been added to his medications.  2.  Dyspnea episodes-recent  admission for dyspnea, secondary to reflux. -PPI was increased to twice daily.  No further symptoms at this time..  3. Chronic A. fib-rate controlled. On Cardizem. -Eliquis for anticoagulation  4. Insulin-dependent diabetes mellitus-  -NPH insulin and metformin  5. COPD and sleep apnea-counseled about using the CPAP consistently. -advair bid, advised to use his nebs prn for dyspnea and monitor  Very anxious to be discharged.  Denies any complaints at this time.  Has been ambulatory without any recurrence of chest pain.   DISCHARGE CONDITIONS:   Guarded  CONSULTS OBTAINED:   Treatment Team:  Isaias Cowman, MD  DRUG ALLERGIES:   Allergies  Allergen Reactions  . Contrast Media [Iodinated Diagnostic Agents] Shortness Of Breath    Hypoxia/hypotension  . Versed [Midazolam] Other (See Comments)    Hypotension/hypoxia   DISCHARGE MEDICATIONS:   Allergies as of 12/05/2017      Reactions   Contrast Media [iodinated Diagnostic Agents] Shortness Of Breath   Hypoxia/hypotension   Versed [midazolam] Other (See Comments)   Hypotension/hypoxia      Medication List    STOP taking these medications   potassium chloride SA 20 MEQ tablet Commonly known as:  K-DUR,KLOR-CON     TAKE these medications   albuterol 108 (90 Base) MCG/ACT inhaler Commonly known as:  PROVENTIL HFA;VENTOLIN HFA Inhale 2 puffs into the lungs every 6 (six) hours as needed for wheezing or shortness of breath.   apixaban 5 MG Tabs tablet Commonly known as:  ELIQUIS Take 5 mg by mouth 2 (two) times daily.   atorvastatin 40  MG tablet Commonly known as:  LIPITOR Take 1 tablet (40 mg total) by mouth daily at 6 PM.   baclofen 10 MG tablet Commonly known as:  LIORESAL Take 10 mg by mouth 2 (two) times daily as needed for muscle spasms.   clopidogrel 75 MG tablet Commonly known as:  PLAVIX Take 1 tablet (75 mg total) by mouth daily with breakfast.   diltiazem 180 MG 24 hr  capsule Commonly known as:  CARDIZEM CD Take 180 mg by mouth 2 (two) times daily.   DULoxetine 20 MG capsule Commonly known as:  CYMBALTA Take 20 mg by mouth daily.   Fluticasone-Salmeterol 250-50 MCG/DOSE Aepb Commonly known as:  ADVAIR Inhale 1 puff into the lungs 2 (two) times daily.   insulin NPH-regular Human (70-30) 100 UNIT/ML injection Commonly known as:  NOVOLIN 70/30 Inject 85-100 Units into the skin 2 (two) times daily with a meal. 85 units in the morning and 100 units in the evening   isosorbide mononitrate 30 MG 24 hr tablet Commonly known as:  IMDUR Take 1 tablet (30 mg total) by mouth daily. Start taking on:  12/06/2017   lisinopril 10 MG tablet Commonly known as:  PRINIVIL,ZESTRIL Take 1 tablet (10 mg total) by mouth daily.   metFORMIN 1000 MG tablet Commonly known as:  GLUCOPHAGE Take 1,000 mg by mouth 2 (two) times daily with a meal.   metoprolol tartrate 100 MG tablet Commonly known as:  LOPRESSOR Take 100 mg by mouth 2 (two) times daily.   nitroGLYCERIN 0.4 MG SL tablet Commonly known as:  NITROSTAT Place 0.4 mg under the tongue every 5 (five) minutes as needed for chest pain.   pantoprazole 40 MG tablet Commonly known as:  PROTONIX Take 1 tablet (40 mg total) by mouth 2 (two) times daily. What changed:  when to take this   Potassium Chloride ER 20 MEQ Tbcr Take 20 mEq by mouth daily.   pramipexole 0.5 MG tablet Commonly known as:  MIRAPEX Take 0.5 mg by mouth 3 (three) times daily as needed.   torsemide 20 MG tablet Commonly known as:  DEMADEX Take 2 tablets (40 mg total) by mouth daily.   zolpidem 5 MG tablet Commonly known as:  AMBIEN Take 1 tablet (5 mg total) by mouth at bedtime as needed for sleep.        DISCHARGE INSTRUCTIONS:   1. PCP f/u in 1-2 weeks 2. Cardiology f/u in 1 week  DIET:   Cardiac diet  ACTIVITY:   Activity as tolerated  OXYGEN:   Home Oxygen: No.  Oxygen Delivery: room air  DISCHARGE LOCATION:    home   If you experience worsening of your admission symptoms, develop shortness of breath, life threatening emergency, suicidal or homicidal thoughts you must seek medical attention immediately by calling 911 or calling your MD immediately  if symptoms less severe.  You Must read complete instructions/literature along with all the possible adverse reactions/side effects for all the Medicines you take and that have been prescribed to you. Take any new Medicines after you have completely understood and accpet all the possible adverse reactions/side effects.   Please note  You were cared for by a hospitalist during your hospital stay. If you have any questions about your discharge medications or the care you received while you were in the hospital after you are discharged, you can call the unit and asked to speak with the hospitalist on call if the hospitalist that took care of you is not  available. Once you are discharged, your primary care physician will handle any further medical issues. Please note that NO REFILLS for any discharge medications will be authorized once you are discharged, as it is imperative that you return to your primary care physician (or establish a relationship with a primary care physician if you do not have one) for your aftercare needs so that they can reassess your need for medications and monitor your lab values.    On the day of Discharge:  VITAL SIGNS:   Blood pressure (!) 145/66, pulse 63, temperature 97.9 F (36.6 C), temperature source Oral, resp. rate 20, height 5\' 7"  (1.702 m), weight 124.8 kg (275 lb 3.2 oz), SpO2 99 %.  PHYSICAL EXAMINATION:    GENERAL:  62 y.o.-year-old obese patient lying in the bed with no acute distress.  EYES: Pupils equal, round, reactive to light and accommodation. No scleral icterus. Extraocular muscles intact.  HEENT: Head atraumatic, normocephalic. Oropharynx and nasopharynx clear.  NECK:  Supple, no jugular venous distention.  No thyroid enlargement, no tenderness.  LUNGS: Normal breath sounds bilaterally, no wheezing, rales,rhonchi or crepitation. No use of accessory muscles of respiration.  CARDIOVASCULAR: S1, S2 normal. No murmurs, rubs, or gallops.  ABDOMEN: Soft, nontender, nondistended. Bowel sounds present. No organomegaly or mass.  EXTREMITIES: No pedal edema, cyanosis, or clubbing.  NEUROLOGIC: Cranial nerves II through XII are intact. Muscle strength 5/5 in all extremities. Sensation intact. Gait not checked.  PSYCHIATRIC: The patient is alert and oriented x 3.  SKIN: No obvious rash, lesion, or ulcer.    DATA REVIEW:   CBC Recent Labs  Lab 12/05/17 0535  WBC 6.6  HGB 12.0*  HCT 34.6*  PLT 148*    Chemistries  Recent Labs  Lab 12/03/17 2000  12/05/17 0535  NA 139   < > 139  K 3.6   < > 3.7  CL 106   < > 102  CO2 22   < > 26  GLUCOSE 283*   < > 167*  BUN 17   < > 16  CREATININE 1.00   < > 0.75  CALCIUM 9.0   < > 8.9  AST 33  --   --   ALT 31  --   --   ALKPHOS 54  --   --   BILITOT 0.5  --   --    < > = values in this interval not displayed.     Microbiology Results  Results for orders placed or performed during the hospital encounter of 10/28/17  MRSA PCR Screening     Status: None   Collection Time: 10/30/17  2:39 PM  Result Value Ref Range Status   MRSA by PCR NEGATIVE NEGATIVE Final    Comment:        The GeneXpert MRSA Assay (FDA approved for NASAL specimens only), is one component of a comprehensive MRSA colonization surveillance program. It is not intended to diagnose MRSA infection nor to guide or monitor treatment for MRSA infections. Performed at [facility], [address] [place],  [address]     RADIOLOGY:  No results found.   Management plans discussed with the patient, family and they are in agreement.  CODE STATUS:     Code Status Orders  (From admission, onward)        Start     Ordered   12/03/17 2124  Full code   Continuous     12/03/17 2125    Code Status History  Date Active Date Inactive Code Status Order ID Comments User Context   11/29/2017 1422 11/30/2017 1853 Full Code [id number]  [name], MD ED   10/28/2017 1958 11/01/2017 1658 Full Code [id number]  Salary, Avel Peace, MD ED   10/22/2017 0551 10/24/2017 1801 Full Code [id number]  Arta Silence, MD ED   08/10/2017 2205 08/14/2017 1512 Full Code [id number]  Vaughan Basta, MD Inpatient   07/31/2017 2045 08/02/2017 1648 Full Code [id number]  Henreitta Leber, MD Inpatient   07/28/2017 2230 07/31/2017 1428 Full Code [id number]  Dustin Flock, MD Inpatient   03/01/2017 0203 03/01/2017 2004 Full Code [id number]  Hugelmeyer, Alexis, DO Inpatient   11/21/2015 1620 11/23/2015 1710 Full Code [id number]  Sherren Mocha, MD Inpatient   05/06/2015 1545 102/28/2016 1609 Full Code [id number]  Sharlotte Alamo, MD Inpatient   05/04/2015 1951 05/06/2015 1545 Full Code [id number]  Vaughan Basta, MD ED   01/14/2015 1416 01/15/2015 1514 Full Code [id number]  Lance Coon, MD Inpatient   12/22/2014 1825 12/23/2014 1612 Full Code [id number]  Epifanio Lesches, MD ED   10/14/2014 1214 10/15/2014 1350 Full Code [id number]  Alfonso Patten, RN Inpatient      TOTAL TIME TAKING CARE OF THIS PATIENT: 38 minutes.    Gladstone Lighter M.D on 12/05/2017 at 11:40 AM  Between 7am to 6pm - Pager - [phone]  After 6pm go to [email or web address] - Proofreader  [employer] [place] [facility]  Office  [phone]  CC: Primary care physician; Idelle Crouch, MD   Note: This dictation was prepared with Dragon dictation along with smaller phrase technology. Any transcriptional errors that result from this process are unintentional.

## 2017-12-07 ENCOUNTER — Encounter: Payer: Self-pay | Admitting: *Deleted

## 2017-12-07 ENCOUNTER — Other Ambulatory Visit: Payer: Self-pay | Admitting: *Deleted

## 2017-12-07 DIAGNOSIS — L97512 Non-pressure chronic ulcer of other part of right foot with fat layer exposed: Secondary | ICD-10-CM | POA: Diagnosis not present

## 2017-12-07 DIAGNOSIS — I25810 Atherosclerosis of coronary artery bypass graft(s) without angina pectoris: Secondary | ICD-10-CM | POA: Diagnosis not present

## 2017-12-07 DIAGNOSIS — E1159 Type 2 diabetes mellitus with other circulatory complications: Secondary | ICD-10-CM | POA: Diagnosis not present

## 2017-12-07 DIAGNOSIS — L97521 Non-pressure chronic ulcer of other part of left foot limited to breakdown of skin: Secondary | ICD-10-CM | POA: Diagnosis not present

## 2017-12-07 DIAGNOSIS — I10 Essential (primary) hypertension: Secondary | ICD-10-CM | POA: Diagnosis not present

## 2017-12-07 NOTE — Patient Outreach (Signed)
[facility] [place]) [place] Telephone Outreach PCP office completes Transition of Care follow up post-hospital discharge [facility] discharge day #2 Unsuccessful telephone Outreach attempt #1  12/07/2017  Marcus Williams 04-Jan-1956 [id number]  Unsuccessful telephone outreach attempt to [name], 62 y/o male referred to [place] for transition of care after multiple recent hospitalizations; patient had most recent hospital admission June 20-22, 2019 for unstable angina/ acute coronary syndrome.  Patient was discharged home to self-care with home health services through [place] in place.  Patient has history including, but not limited to, Type II DM; COPD with OSA, CPAP; HTN; CAD with recent RCA stent and previous CABG; A-Fib with previous TIA; CHF; and morbid obesity.  Patient has had 7 in-patient hospitals admissions in 2019.  HIPAA compliant voice mail message left for patient on his preferred number (mobile), requesting return call back.  Second call attempt to home number as listed in EMR immediately placed after first unsuccessful attempt to patient's mobile number-- received automated outgoing voice message stating that "this number has been changed/ disconnected/ or is no longer in service."  Plan:  Will place [facility] Community CM unsuccessful patient outreach letter in mail requesting call back in writing  Will re-attempt [place] telephone outreach later this week if I do not hear back from patient first.  Oneta Rack, RN, BSN, [place] Coordinator [facility] Care Management  [phone]

## 2017-12-08 DIAGNOSIS — T380X5A Adverse effect of glucocorticoids and synthetic analogues, initial encounter: Secondary | ICD-10-CM | POA: Diagnosis not present

## 2017-12-08 DIAGNOSIS — Z955 Presence of coronary angioplasty implant and graft: Secondary | ICD-10-CM | POA: Diagnosis not present

## 2017-12-08 DIAGNOSIS — D62 Acute posthemorrhagic anemia: Secondary | ICD-10-CM | POA: Diagnosis not present

## 2017-12-08 DIAGNOSIS — Z452 Encounter for adjustment and management of vascular access device: Secondary | ICD-10-CM | POA: Diagnosis not present

## 2017-12-08 DIAGNOSIS — R509 Fever, unspecified: Secondary | ICD-10-CM | POA: Diagnosis not present

## 2017-12-08 DIAGNOSIS — I2119 ST elevation (STEMI) myocardial infarction involving other coronary artery of inferior wall: Secondary | ICD-10-CM | POA: Diagnosis not present

## 2017-12-08 DIAGNOSIS — Z951 Presence of aortocoronary bypass graft: Secondary | ICD-10-CM | POA: Diagnosis not present

## 2017-12-08 DIAGNOSIS — I25810 Atherosclerosis of coronary artery bypass graft(s) without angina pectoris: Secondary | ICD-10-CM | POA: Diagnosis not present

## 2017-12-08 DIAGNOSIS — T508X5A Adverse effect of diagnostic agents, initial encounter: Secondary | ICD-10-CM | POA: Diagnosis not present

## 2017-12-08 DIAGNOSIS — T782XXA Anaphylactic shock, unspecified, initial encounter: Secondary | ICD-10-CM | POA: Diagnosis not present

## 2017-12-08 DIAGNOSIS — I63541 Cerebral infarction due to unspecified occlusion or stenosis of right cerebellar artery: Secondary | ICD-10-CM | POA: Diagnosis not present

## 2017-12-08 DIAGNOSIS — Z794 Long term (current) use of insulin: Secondary | ICD-10-CM | POA: Diagnosis not present

## 2017-12-08 DIAGNOSIS — B9620 Unspecified Escherichia coli [E. coli] as the cause of diseases classified elsewhere: Secondary | ICD-10-CM | POA: Diagnosis not present

## 2017-12-08 DIAGNOSIS — M545 Low back pain: Secondary | ICD-10-CM | POA: Diagnosis not present

## 2017-12-08 DIAGNOSIS — Y33XXXA Other specified events, undetermined intent, initial encounter: Secondary | ICD-10-CM | POA: Diagnosis not present

## 2017-12-08 DIAGNOSIS — J90 Pleural effusion, not elsewhere classified: Secondary | ICD-10-CM | POA: Diagnosis not present

## 2017-12-08 DIAGNOSIS — Z952 Presence of prosthetic heart valve: Secondary | ICD-10-CM | POA: Diagnosis not present

## 2017-12-08 DIAGNOSIS — E785 Hyperlipidemia, unspecified: Secondary | ICD-10-CM | POA: Diagnosis not present

## 2017-12-08 DIAGNOSIS — T886XXA Anaphylactic reaction due to adverse effect of correct drug or medicament properly administered, initial encounter: Secondary | ICD-10-CM | POA: Diagnosis not present

## 2017-12-08 DIAGNOSIS — J9601 Acute respiratory failure with hypoxia: Secondary | ICD-10-CM | POA: Diagnosis not present

## 2017-12-08 DIAGNOSIS — J9602 Acute respiratory failure with hypercapnia: Secondary | ICD-10-CM | POA: Diagnosis not present

## 2017-12-08 DIAGNOSIS — I2510 Atherosclerotic heart disease of native coronary artery without angina pectoris: Secondary | ICD-10-CM | POA: Diagnosis not present

## 2017-12-08 DIAGNOSIS — E1142 Type 2 diabetes mellitus with diabetic polyneuropathy: Secondary | ICD-10-CM | POA: Diagnosis not present

## 2017-12-08 DIAGNOSIS — I2111 ST elevation (STEMI) myocardial infarction involving right coronary artery: Secondary | ICD-10-CM | POA: Diagnosis not present

## 2017-12-08 DIAGNOSIS — J811 Chronic pulmonary edema: Secondary | ICD-10-CM | POA: Diagnosis not present

## 2017-12-08 DIAGNOSIS — I214 Non-ST elevation (NSTEMI) myocardial infarction: Secondary | ICD-10-CM | POA: Diagnosis not present

## 2017-12-08 DIAGNOSIS — R918 Other nonspecific abnormal finding of lung field: Secondary | ICD-10-CM | POA: Diagnosis not present

## 2017-12-08 DIAGNOSIS — R079 Chest pain, unspecified: Secondary | ICD-10-CM | POA: Diagnosis not present

## 2017-12-08 DIAGNOSIS — I5030 Unspecified diastolic (congestive) heart failure: Secondary | ICD-10-CM | POA: Diagnosis not present

## 2017-12-08 DIAGNOSIS — E8881 Metabolic syndrome: Secondary | ICD-10-CM | POA: Diagnosis not present

## 2017-12-08 DIAGNOSIS — J9622 Acute and chronic respiratory failure with hypercapnia: Secondary | ICD-10-CM | POA: Diagnosis not present

## 2017-12-08 DIAGNOSIS — E118 Type 2 diabetes mellitus with unspecified complications: Secondary | ICD-10-CM | POA: Diagnosis not present

## 2017-12-08 DIAGNOSIS — I480 Paroxysmal atrial fibrillation: Secondary | ICD-10-CM | POA: Diagnosis not present

## 2017-12-08 DIAGNOSIS — I213 ST elevation (STEMI) myocardial infarction of unspecified site: Secondary | ICD-10-CM | POA: Diagnosis not present

## 2017-12-08 DIAGNOSIS — I350 Nonrheumatic aortic (valve) stenosis: Secondary | ICD-10-CM | POA: Diagnosis not present

## 2017-12-08 DIAGNOSIS — J9811 Atelectasis: Secondary | ICD-10-CM | POA: Diagnosis not present

## 2017-12-08 DIAGNOSIS — E872 Acidosis: Secondary | ICD-10-CM | POA: Diagnosis not present

## 2017-12-08 DIAGNOSIS — Z7901 Long term (current) use of anticoagulants: Secondary | ICD-10-CM | POA: Diagnosis not present

## 2017-12-08 DIAGNOSIS — A4151 Sepsis due to Escherichia coli [E. coli]: Secondary | ICD-10-CM | POA: Diagnosis not present

## 2017-12-08 DIAGNOSIS — R7881 Bacteremia: Secondary | ICD-10-CM | POA: Diagnosis not present

## 2017-12-08 DIAGNOSIS — I517 Cardiomegaly: Secondary | ICD-10-CM | POA: Diagnosis not present

## 2017-12-08 DIAGNOSIS — J449 Chronic obstructive pulmonary disease, unspecified: Secondary | ICD-10-CM | POA: Diagnosis not present

## 2017-12-08 DIAGNOSIS — Z91041 Radiographic dye allergy status: Secondary | ICD-10-CM | POA: Diagnosis not present

## 2017-12-08 DIAGNOSIS — I5042 Chronic combined systolic (congestive) and diastolic (congestive) heart failure: Secondary | ICD-10-CM | POA: Diagnosis not present

## 2017-12-08 DIAGNOSIS — R739 Hyperglycemia, unspecified: Secondary | ICD-10-CM | POA: Diagnosis not present

## 2017-12-08 DIAGNOSIS — I10 Essential (primary) hypertension: Secondary | ICD-10-CM | POA: Diagnosis not present

## 2017-12-08 DIAGNOSIS — Z9911 Dependence on respirator [ventilator] status: Secondary | ICD-10-CM | POA: Diagnosis not present

## 2017-12-08 DIAGNOSIS — J9621 Acute and chronic respiratory failure with hypoxia: Secondary | ICD-10-CM | POA: Diagnosis not present

## 2017-12-08 DIAGNOSIS — S91301A Unspecified open wound, right foot, initial encounter: Secondary | ICD-10-CM | POA: Diagnosis not present

## 2017-12-08 DIAGNOSIS — K920 Hematemesis: Secondary | ICD-10-CM | POA: Diagnosis not present

## 2017-12-08 DIAGNOSIS — Q231 Congenital insufficiency of aortic valve: Secondary | ICD-10-CM | POA: Diagnosis not present

## 2017-12-08 DIAGNOSIS — I5022 Chronic systolic (congestive) heart failure: Secondary | ICD-10-CM | POA: Diagnosis not present

## 2017-12-08 DIAGNOSIS — Z4682 Encounter for fitting and adjustment of non-vascular catheter: Secondary | ICD-10-CM | POA: Diagnosis not present

## 2017-12-10 ENCOUNTER — Encounter: Payer: Self-pay | Admitting: *Deleted

## 2017-12-10 ENCOUNTER — Other Ambulatory Visit: Payer: Self-pay | Admitting: *Deleted

## 2017-12-10 NOTE — Patient Outreach (Signed)
[facility]) [place] Telephone Outreach PCP office completes Transition of Care follow up post-hospital discharge [facility] discharge day #5 Unsuccessful telephone Outreach attempt #2  12/10/2017  Marcus Williams 1955/07/29 [id number]  Unsuccessful telephone outreach attempt to Marcus Williams, 62 y/o male referred to [place] for transition of care after multiple recent hospitalizations; patient had most recent hospital admission June 20-22, 2019 for unstable angina/ acute coronary syndrome. Patient was discharged home to self-care with home health services through [place] in place. Patient has history including, but not limited to, Type II DM; COPD with OSA, CPAP; HTN; CAD with recent RCA stent and previous CABG; A-Fib with previous TIA; CHF; and morbid obesity.  Patient has had 7 in-patient hospitals admissions in 2019.  HIPAA compliant voice mail message left for patient on his preferred number (mobile), requesting return call back.  Plan:  Confirmed [facility] Community CM unsuccessful patient outreach letter mailed after unsuccessful initial attempt   Will re-attempt [place] telephone outreach again next week if I do not hear back from patient first.  Oneta Rack, RN, BSN, [place] Coordinator [facility] Care Management  [phone]

## 2017-12-14 ENCOUNTER — Encounter: Payer: Self-pay | Admitting: *Deleted

## 2017-12-14 ENCOUNTER — Other Ambulatory Visit: Payer: Self-pay | Admitting: *Deleted

## 2017-12-14 NOTE — Patient Outreach (Signed)
[facility] [facility]) [place] Telephone Outreach PCP office completes Transition of Care follow up post-hospital discharge [facility] discharge day#8 Unsuccessful telephone Outreach attempt # 3   12/14/2017  TYLAR AMBORN [birth year] [id number]  Unsuccessful telephone outreach; third unsuccessful attempt, Dossie Der, 62 y/o male referred to [place] for transition of care after multiple recent hospitalizations; patient had most recent hospital admissionJune20-22, 2019 for unstable angina/ acute coronary syndrome. Patient was discharged home to self-care with home health services through [place] in place. Patient has history including, but not limited to, Type II DM; COPD with OSA, CPAP; HTN; CAD with recent RCA stent and previous CABG; A-Fib with previous TIA; CHF; and morbid obesity.Patient has had 7 in-patient hospitals admissions in 2019.  HIPAA compliant voice mail message left for patienton his preferred number (mobile), requesting return call back.  Plan:  Confirmed [facility] Community CM unsuccessful patient outreach letter mailed after unsuccessful initial attempt   Will proceed to [place] case closure if I do not hear back from patient within 10 business days of sending unsuccessful outreach letter.  Oneta Rack, RN, BSN, [employer] [facility] Care Management  [phone]

## 2017-12-18 ENCOUNTER — Other Ambulatory Visit: Payer: Self-pay | Admitting: *Deleted

## 2017-12-18 NOTE — Patient Outreach (Signed)
[facility]) Care Management [facility] CM Case Closure-- Unable to contact 12/18/2017  Marcus Williams 20-Feb-1956 [id number]  [place] CM Case closure note re:   Marcus Williams, 62 y/o male referred to [place] for transition of care after multiple recent hospitalizations; patient had most recent hospital admissionJune20-22, 2019 for unstable angina/ acute coronary syndrome. Patient was discharged home to self-care with home health services through [place] in place. Patient has history including, but not limited to, Type II DM; COPD with OSA, CPAP; HTN; CAD with recent RCA stent and previous CABG; A-Fib with previous TIA; CHF; and morbid obesity.Patient has had 7 in-patient hospitals admissions in 2019.  Three unsuccessful telephone outreach attempts previously made to patient over last 10 business days; patient did not return voice mail messages x 3; did not respond to unsuccessful outreach patient letter requesting call-back in writing, within 10 business days of initial telephone attempt/ letter mailing.  Plan:  Will proceed to [place] case closure, unable to establish contact with patient; will make patient's PCP aware of [facility] Community CM case closure  Marcus Rack, RN, BSN, [place] Care Management  [phone]

## 2017-12-24 DIAGNOSIS — E1159 Type 2 diabetes mellitus with other circulatory complications: Secondary | ICD-10-CM | POA: Diagnosis not present

## 2017-12-24 DIAGNOSIS — Z79899 Other long term (current) drug therapy: Secondary | ICD-10-CM | POA: Diagnosis not present

## 2017-12-24 DIAGNOSIS — E1142 Type 2 diabetes mellitus with diabetic polyneuropathy: Secondary | ICD-10-CM | POA: Diagnosis not present

## 2017-12-24 DIAGNOSIS — E7800 Pure hypercholesterolemia, unspecified: Secondary | ICD-10-CM | POA: Diagnosis not present

## 2017-12-24 DIAGNOSIS — Z951 Presence of aortocoronary bypass graft: Secondary | ICD-10-CM | POA: Diagnosis not present

## 2017-12-24 DIAGNOSIS — Z9989 Dependence on other enabling machines and devices: Secondary | ICD-10-CM | POA: Diagnosis not present

## 2017-12-24 DIAGNOSIS — G4733 Obstructive sleep apnea (adult) (pediatric): Secondary | ICD-10-CM | POA: Diagnosis not present

## 2017-12-24 DIAGNOSIS — I2510 Atherosclerotic heart disease of native coronary artery without angina pectoris: Secondary | ICD-10-CM | POA: Diagnosis not present

## 2017-12-24 DIAGNOSIS — Q231 Congenital insufficiency of aortic valve: Secondary | ICD-10-CM | POA: Diagnosis not present

## 2017-12-24 DIAGNOSIS — I25810 Atherosclerosis of coronary artery bypass graft(s) without angina pectoris: Secondary | ICD-10-CM | POA: Diagnosis not present

## 2017-12-29 DIAGNOSIS — I480 Paroxysmal atrial fibrillation: Secondary | ICD-10-CM | POA: Diagnosis not present

## 2017-12-29 DIAGNOSIS — I10 Essential (primary) hypertension: Secondary | ICD-10-CM | POA: Diagnosis not present

## 2017-12-29 DIAGNOSIS — I25810 Atherosclerosis of coronary artery bypass graft(s) without angina pectoris: Secondary | ICD-10-CM | POA: Diagnosis not present

## 2017-12-29 DIAGNOSIS — Q231 Congenital insufficiency of aortic valve: Secondary | ICD-10-CM | POA: Diagnosis not present

## 2017-12-29 DIAGNOSIS — E7800 Pure hypercholesterolemia, unspecified: Secondary | ICD-10-CM | POA: Diagnosis not present

## 2018-01-05 DIAGNOSIS — I25709 Atherosclerosis of coronary artery bypass graft(s), unspecified, with unspecified angina pectoris: Secondary | ICD-10-CM | POA: Diagnosis not present

## 2018-01-05 DIAGNOSIS — Z01818 Encounter for other preprocedural examination: Secondary | ICD-10-CM | POA: Diagnosis not present

## 2018-01-05 DIAGNOSIS — I350 Nonrheumatic aortic (valve) stenosis: Secondary | ICD-10-CM | POA: Diagnosis not present

## 2018-01-05 DIAGNOSIS — I25119 Atherosclerotic heart disease of native coronary artery with unspecified angina pectoris: Secondary | ICD-10-CM | POA: Diagnosis not present

## 2018-01-24 DIAGNOSIS — M2041 Other hammer toe(s) (acquired), right foot: Secondary | ICD-10-CM | POA: Diagnosis not present

## 2018-01-24 DIAGNOSIS — J9690 Respiratory failure, unspecified, unspecified whether with hypoxia or hypercapnia: Secondary | ICD-10-CM | POA: Diagnosis not present

## 2018-01-24 DIAGNOSIS — I97710 Intraoperative cardiac arrest during cardiac surgery: Secondary | ICD-10-CM | POA: Diagnosis not present

## 2018-01-24 DIAGNOSIS — I509 Heart failure, unspecified: Secondary | ICD-10-CM | POA: Diagnosis not present

## 2018-01-24 DIAGNOSIS — E7800 Pure hypercholesterolemia, unspecified: Secondary | ICD-10-CM | POA: Diagnosis not present

## 2018-01-24 DIAGNOSIS — E1169 Type 2 diabetes mellitus with other specified complication: Secondary | ICD-10-CM | POA: Diagnosis not present

## 2018-01-24 DIAGNOSIS — Z4659 Encounter for fitting and adjustment of other gastrointestinal appliance and device: Secondary | ICD-10-CM | POA: Diagnosis not present

## 2018-01-24 DIAGNOSIS — J9602 Acute respiratory failure with hypercapnia: Secondary | ICD-10-CM | POA: Diagnosis not present

## 2018-01-24 DIAGNOSIS — F3289 Other specified depressive episodes: Secondary | ICD-10-CM | POA: Diagnosis not present

## 2018-01-24 DIAGNOSIS — Z789 Other specified health status: Secondary | ICD-10-CM | POA: Diagnosis not present

## 2018-01-24 DIAGNOSIS — Z452 Encounter for adjustment and management of vascular access device: Secondary | ICD-10-CM | POA: Diagnosis not present

## 2018-01-24 DIAGNOSIS — D126 Benign neoplasm of colon, unspecified: Secondary | ICD-10-CM | POA: Diagnosis not present

## 2018-01-24 DIAGNOSIS — I110 Hypertensive heart disease with heart failure: Secondary | ICD-10-CM | POA: Diagnosis not present

## 2018-01-24 DIAGNOSIS — R498 Other voice and resonance disorders: Secondary | ICD-10-CM | POA: Diagnosis not present

## 2018-01-24 DIAGNOSIS — E1100 Type 2 diabetes mellitus with hyperosmolarity without nonketotic hyperglycemic-hyperosmolar coma (NKHHC): Secondary | ICD-10-CM | POA: Diagnosis not present

## 2018-01-24 DIAGNOSIS — T8111XA Postprocedural  cardiogenic shock, initial encounter: Secondary | ICD-10-CM | POA: Diagnosis not present

## 2018-01-24 DIAGNOSIS — C189 Malignant neoplasm of colon, unspecified: Secondary | ICD-10-CM | POA: Diagnosis not present

## 2018-01-24 DIAGNOSIS — R491 Aphonia: Secondary | ICD-10-CM | POA: Diagnosis not present

## 2018-01-24 DIAGNOSIS — R109 Unspecified abdominal pain: Secondary | ICD-10-CM | POA: Diagnosis not present

## 2018-01-24 DIAGNOSIS — D649 Anemia, unspecified: Secondary | ICD-10-CM | POA: Diagnosis not present

## 2018-01-24 DIAGNOSIS — E08621 Diabetes mellitus due to underlying condition with foot ulcer: Secondary | ICD-10-CM | POA: Diagnosis not present

## 2018-01-24 DIAGNOSIS — D62 Acute posthemorrhagic anemia: Secondary | ICD-10-CM | POA: Diagnosis not present

## 2018-01-24 DIAGNOSIS — I4891 Unspecified atrial fibrillation: Secondary | ICD-10-CM | POA: Diagnosis not present

## 2018-01-24 DIAGNOSIS — Z951 Presence of aortocoronary bypass graft: Secondary | ICD-10-CM | POA: Diagnosis not present

## 2018-01-24 DIAGNOSIS — D72829 Elevated white blood cell count, unspecified: Secondary | ICD-10-CM | POA: Diagnosis not present

## 2018-01-24 DIAGNOSIS — J90 Pleural effusion, not elsewhere classified: Secondary | ICD-10-CM | POA: Diagnosis not present

## 2018-01-24 DIAGNOSIS — Z136 Encounter for screening for cardiovascular disorders: Secondary | ICD-10-CM | POA: Diagnosis not present

## 2018-01-24 DIAGNOSIS — I5021 Acute systolic (congestive) heart failure: Secondary | ICD-10-CM | POA: Diagnosis not present

## 2018-01-24 DIAGNOSIS — Q231 Congenital insufficiency of aortic valve: Secondary | ICD-10-CM | POA: Diagnosis not present

## 2018-01-24 DIAGNOSIS — F418 Other specified anxiety disorders: Secondary | ICD-10-CM | POA: Diagnosis not present

## 2018-01-24 DIAGNOSIS — F4323 Adjustment disorder with mixed anxiety and depressed mood: Secondary | ICD-10-CM | POA: Diagnosis not present

## 2018-01-24 DIAGNOSIS — R41841 Cognitive communication deficit: Secondary | ICD-10-CM | POA: Diagnosis not present

## 2018-01-24 DIAGNOSIS — E1142 Type 2 diabetes mellitus with diabetic polyneuropathy: Secondary | ICD-10-CM | POA: Diagnosis not present

## 2018-01-24 DIAGNOSIS — E119 Type 2 diabetes mellitus without complications: Secondary | ICD-10-CM | POA: Diagnosis not present

## 2018-01-24 DIAGNOSIS — I50810 Right heart failure, unspecified: Secondary | ICD-10-CM | POA: Diagnosis not present

## 2018-01-24 DIAGNOSIS — E11621 Type 2 diabetes mellitus with foot ulcer: Secondary | ICD-10-CM | POA: Diagnosis not present

## 2018-01-24 DIAGNOSIS — Z91041 Radiographic dye allergy status: Secondary | ICD-10-CM | POA: Diagnosis not present

## 2018-01-24 DIAGNOSIS — J9601 Acute respiratory failure with hypoxia: Secondary | ICD-10-CM | POA: Diagnosis not present

## 2018-01-24 DIAGNOSIS — E785 Hyperlipidemia, unspecified: Secondary | ICD-10-CM | POA: Diagnosis not present

## 2018-01-24 DIAGNOSIS — J811 Chronic pulmonary edema: Secondary | ICD-10-CM | POA: Diagnosis not present

## 2018-01-24 DIAGNOSIS — I472 Ventricular tachycardia: Secondary | ICD-10-CM | POA: Diagnosis not present

## 2018-01-24 DIAGNOSIS — R1312 Dysphagia, oropharyngeal phase: Secondary | ICD-10-CM | POA: Diagnosis not present

## 2018-01-24 DIAGNOSIS — Z79899 Other long term (current) drug therapy: Secondary | ICD-10-CM | POA: Diagnosis not present

## 2018-01-24 DIAGNOSIS — Z794 Long term (current) use of insulin: Secondary | ICD-10-CM | POA: Diagnosis not present

## 2018-01-24 DIAGNOSIS — I519 Heart disease, unspecified: Secondary | ICD-10-CM | POA: Diagnosis not present

## 2018-01-24 DIAGNOSIS — E1140 Type 2 diabetes mellitus with diabetic neuropathy, unspecified: Secondary | ICD-10-CM | POA: Diagnosis not present

## 2018-01-24 DIAGNOSIS — I480 Paroxysmal atrial fibrillation: Secondary | ICD-10-CM | POA: Diagnosis not present

## 2018-01-24 DIAGNOSIS — Z6841 Body Mass Index (BMI) 40.0 and over, adult: Secondary | ICD-10-CM | POA: Diagnosis not present

## 2018-01-24 DIAGNOSIS — E1165 Type 2 diabetes mellitus with hyperglycemia: Secondary | ICD-10-CM | POA: Diagnosis not present

## 2018-01-24 DIAGNOSIS — L97408 Non-pressure chronic ulcer of unspecified heel and midfoot with other specified severity: Secondary | ICD-10-CM | POA: Diagnosis not present

## 2018-01-24 DIAGNOSIS — A0472 Enterocolitis due to Clostridium difficile, not specified as recurrent: Secondary | ICD-10-CM | POA: Diagnosis not present

## 2018-01-24 DIAGNOSIS — I2510 Atherosclerotic heart disease of native coronary artery without angina pectoris: Secondary | ICD-10-CM | POA: Diagnosis not present

## 2018-01-24 DIAGNOSIS — J9811 Atelectasis: Secondary | ICD-10-CM | POA: Diagnosis not present

## 2018-01-24 DIAGNOSIS — I517 Cardiomegaly: Secondary | ICD-10-CM | POA: Diagnosis not present

## 2018-01-24 DIAGNOSIS — F4322 Adjustment disorder with anxiety: Secondary | ICD-10-CM | POA: Diagnosis not present

## 2018-01-24 DIAGNOSIS — I97711 Intraoperative cardiac arrest during other surgery: Secondary | ICD-10-CM | POA: Diagnosis not present

## 2018-01-24 DIAGNOSIS — N390 Urinary tract infection, site not specified: Secondary | ICD-10-CM | POA: Diagnosis not present

## 2018-01-24 DIAGNOSIS — E872 Acidosis: Secondary | ICD-10-CM | POA: Diagnosis not present

## 2018-01-24 DIAGNOSIS — G8918 Other acute postprocedural pain: Secondary | ICD-10-CM | POA: Diagnosis not present

## 2018-01-24 DIAGNOSIS — F05 Delirium due to known physiological condition: Secondary | ICD-10-CM | POA: Diagnosis not present

## 2018-01-24 DIAGNOSIS — Z9889 Other specified postprocedural states: Secondary | ICD-10-CM | POA: Diagnosis not present

## 2018-01-24 DIAGNOSIS — R509 Fever, unspecified: Secondary | ICD-10-CM | POA: Diagnosis not present

## 2018-01-24 DIAGNOSIS — Z7401 Bed confinement status: Secondary | ICD-10-CM | POA: Diagnosis not present

## 2018-01-24 DIAGNOSIS — R918 Other nonspecific abnormal finding of lung field: Secondary | ICD-10-CM | POA: Diagnosis not present

## 2018-01-24 DIAGNOSIS — J95821 Acute postprocedural respiratory failure: Secondary | ICD-10-CM | POA: Diagnosis not present

## 2018-01-24 DIAGNOSIS — E870 Hyperosmolality and hypernatremia: Secondary | ICD-10-CM | POA: Diagnosis not present

## 2018-01-24 DIAGNOSIS — I10 Essential (primary) hypertension: Secondary | ICD-10-CM | POA: Diagnosis not present

## 2018-01-24 DIAGNOSIS — R609 Edema, unspecified: Secondary | ICD-10-CM | POA: Diagnosis not present

## 2018-01-24 DIAGNOSIS — I499 Cardiac arrhythmia, unspecified: Secondary | ICD-10-CM | POA: Diagnosis not present

## 2018-01-24 DIAGNOSIS — L97411 Non-pressure chronic ulcer of right heel and midfoot limited to breakdown of skin: Secondary | ICD-10-CM | POA: Diagnosis not present

## 2018-01-24 DIAGNOSIS — E1143 Type 2 diabetes mellitus with diabetic autonomic (poly)neuropathy: Secondary | ICD-10-CM | POA: Diagnosis not present

## 2018-01-24 DIAGNOSIS — J449 Chronic obstructive pulmonary disease, unspecified: Secondary | ICD-10-CM | POA: Diagnosis not present

## 2018-01-24 DIAGNOSIS — I6523 Occlusion and stenosis of bilateral carotid arteries: Secondary | ICD-10-CM | POA: Diagnosis not present

## 2018-01-24 DIAGNOSIS — F5104 Psychophysiologic insomnia: Secondary | ICD-10-CM | POA: Diagnosis not present

## 2018-01-24 DIAGNOSIS — I5023 Acute on chronic systolic (congestive) heart failure: Secondary | ICD-10-CM | POA: Diagnosis not present

## 2018-01-24 DIAGNOSIS — Z431 Encounter for attention to gastrostomy: Secondary | ICD-10-CM | POA: Diagnosis not present

## 2018-01-24 DIAGNOSIS — A498 Other bacterial infections of unspecified site: Secondary | ICD-10-CM | POA: Diagnosis not present

## 2018-01-24 DIAGNOSIS — M6281 Muscle weakness (generalized): Secondary | ICD-10-CM | POA: Diagnosis not present

## 2018-01-24 DIAGNOSIS — Z9911 Dependence on respirator [ventilator] status: Secondary | ICD-10-CM | POA: Diagnosis not present

## 2018-01-24 DIAGNOSIS — I350 Nonrheumatic aortic (valve) stenosis: Secondary | ICD-10-CM | POA: Diagnosis not present

## 2018-03-09 DIAGNOSIS — I10 Essential (primary) hypertension: Secondary | ICD-10-CM | POA: Diagnosis not present

## 2018-03-09 DIAGNOSIS — I2510 Atherosclerotic heart disease of native coronary artery without angina pectoris: Secondary | ICD-10-CM | POA: Diagnosis not present

## 2018-03-09 DIAGNOSIS — E1100 Type 2 diabetes mellitus with hyperosmolarity without nonketotic hyperglycemic-hyperosmolar coma (NKHHC): Secondary | ICD-10-CM | POA: Diagnosis not present

## 2018-03-09 DIAGNOSIS — L89150 Pressure ulcer of sacral region, unstageable: Secondary | ICD-10-CM | POA: Diagnosis not present

## 2018-03-09 DIAGNOSIS — J811 Chronic pulmonary edema: Secondary | ICD-10-CM | POA: Diagnosis not present

## 2018-03-09 DIAGNOSIS — E1142 Type 2 diabetes mellitus with diabetic polyneuropathy: Secondary | ICD-10-CM | POA: Diagnosis not present

## 2018-03-09 DIAGNOSIS — I509 Heart failure, unspecified: Secondary | ICD-10-CM | POA: Diagnosis not present

## 2018-03-09 DIAGNOSIS — I480 Paroxysmal atrial fibrillation: Secondary | ICD-10-CM | POA: Diagnosis not present

## 2018-03-09 DIAGNOSIS — R498 Other voice and resonance disorders: Secondary | ICD-10-CM | POA: Diagnosis not present

## 2018-03-09 DIAGNOSIS — D649 Anemia, unspecified: Secondary | ICD-10-CM | POA: Diagnosis not present

## 2018-03-09 DIAGNOSIS — I350 Nonrheumatic aortic (valve) stenosis: Secondary | ICD-10-CM | POA: Diagnosis not present

## 2018-03-09 DIAGNOSIS — Z951 Presence of aortocoronary bypass graft: Secondary | ICD-10-CM | POA: Diagnosis not present

## 2018-03-09 DIAGNOSIS — I472 Ventricular tachycardia: Secondary | ICD-10-CM | POA: Diagnosis not present

## 2018-03-09 DIAGNOSIS — F418 Other specified anxiety disorders: Secondary | ICD-10-CM | POA: Diagnosis not present

## 2018-03-09 DIAGNOSIS — L97419 Non-pressure chronic ulcer of right heel and midfoot with unspecified severity: Secondary | ICD-10-CM | POA: Diagnosis not present

## 2018-03-09 DIAGNOSIS — Z9889 Other specified postprocedural states: Secondary | ICD-10-CM | POA: Diagnosis not present

## 2018-03-09 DIAGNOSIS — E119 Type 2 diabetes mellitus without complications: Secondary | ICD-10-CM | POA: Diagnosis not present

## 2018-03-09 DIAGNOSIS — C189 Malignant neoplasm of colon, unspecified: Secondary | ICD-10-CM | POA: Diagnosis not present

## 2018-03-09 DIAGNOSIS — F3289 Other specified depressive episodes: Secondary | ICD-10-CM | POA: Diagnosis not present

## 2018-03-09 DIAGNOSIS — R531 Weakness: Secondary | ICD-10-CM | POA: Diagnosis not present

## 2018-03-09 DIAGNOSIS — E1165 Type 2 diabetes mellitus with hyperglycemia: Secondary | ICD-10-CM | POA: Diagnosis not present

## 2018-03-09 DIAGNOSIS — M6281 Muscle weakness (generalized): Secondary | ICD-10-CM | POA: Diagnosis not present

## 2018-03-09 DIAGNOSIS — I252 Old myocardial infarction: Secondary | ICD-10-CM | POA: Diagnosis not present

## 2018-03-09 DIAGNOSIS — Z7401 Bed confinement status: Secondary | ICD-10-CM | POA: Diagnosis not present

## 2018-03-09 DIAGNOSIS — E1140 Type 2 diabetes mellitus with diabetic neuropathy, unspecified: Secondary | ICD-10-CM | POA: Diagnosis not present

## 2018-03-09 DIAGNOSIS — T8131XD Disruption of external operation (surgical) wound, not elsewhere classified, subsequent encounter: Secondary | ICD-10-CM | POA: Diagnosis not present

## 2018-03-09 DIAGNOSIS — R1312 Dysphagia, oropharyngeal phase: Secondary | ICD-10-CM | POA: Diagnosis not present

## 2018-03-09 DIAGNOSIS — I459 Conduction disorder, unspecified: Secondary | ICD-10-CM | POA: Diagnosis not present

## 2018-03-09 DIAGNOSIS — J449 Chronic obstructive pulmonary disease, unspecified: Secondary | ICD-10-CM | POA: Diagnosis not present

## 2018-03-09 DIAGNOSIS — R41841 Cognitive communication deficit: Secondary | ICD-10-CM | POA: Diagnosis not present

## 2018-03-09 DIAGNOSIS — R5381 Other malaise: Secondary | ICD-10-CM | POA: Diagnosis not present

## 2018-03-09 DIAGNOSIS — Z789 Other specified health status: Secondary | ICD-10-CM | POA: Diagnosis not present

## 2018-03-09 DIAGNOSIS — R9431 Abnormal electrocardiogram [ECG] [EKG]: Secondary | ICD-10-CM | POA: Diagnosis not present

## 2018-03-09 DIAGNOSIS — I4891 Unspecified atrial fibrillation: Secondary | ICD-10-CM | POA: Diagnosis not present

## 2018-03-12 DIAGNOSIS — L89150 Pressure ulcer of sacral region, unstageable: Secondary | ICD-10-CM | POA: Diagnosis not present

## 2018-03-12 DIAGNOSIS — L97419 Non-pressure chronic ulcer of right heel and midfoot with unspecified severity: Secondary | ICD-10-CM | POA: Diagnosis not present

## 2018-03-13 DIAGNOSIS — J449 Chronic obstructive pulmonary disease, unspecified: Secondary | ICD-10-CM | POA: Diagnosis not present

## 2018-03-13 DIAGNOSIS — R5381 Other malaise: Secondary | ICD-10-CM | POA: Diagnosis not present

## 2018-03-13 DIAGNOSIS — I2510 Atherosclerotic heart disease of native coronary artery without angina pectoris: Secondary | ICD-10-CM | POA: Diagnosis not present

## 2018-03-13 DIAGNOSIS — F418 Other specified anxiety disorders: Secondary | ICD-10-CM | POA: Diagnosis not present

## 2018-03-13 DIAGNOSIS — E119 Type 2 diabetes mellitus without complications: Secondary | ICD-10-CM | POA: Diagnosis not present

## 2018-03-13 DIAGNOSIS — I10 Essential (primary) hypertension: Secondary | ICD-10-CM | POA: Diagnosis not present

## 2018-03-13 DIAGNOSIS — I4891 Unspecified atrial fibrillation: Secondary | ICD-10-CM | POA: Diagnosis not present

## 2018-03-16 IMAGING — CR DG CHEST 2V
1 series · 2 of 2 positions shown · non-contrast
Comparison: Chest x-rays dated 10/14/2015 05/16/2015.

CLINICAL DATA: Choking, left-sided chest pain for 1 week.

EXAM:
CHEST  2 VIEW

[Series 1: dg chest 2 view · 0.14mm/px · 2 of 2 slices shown]
[im 1/2]
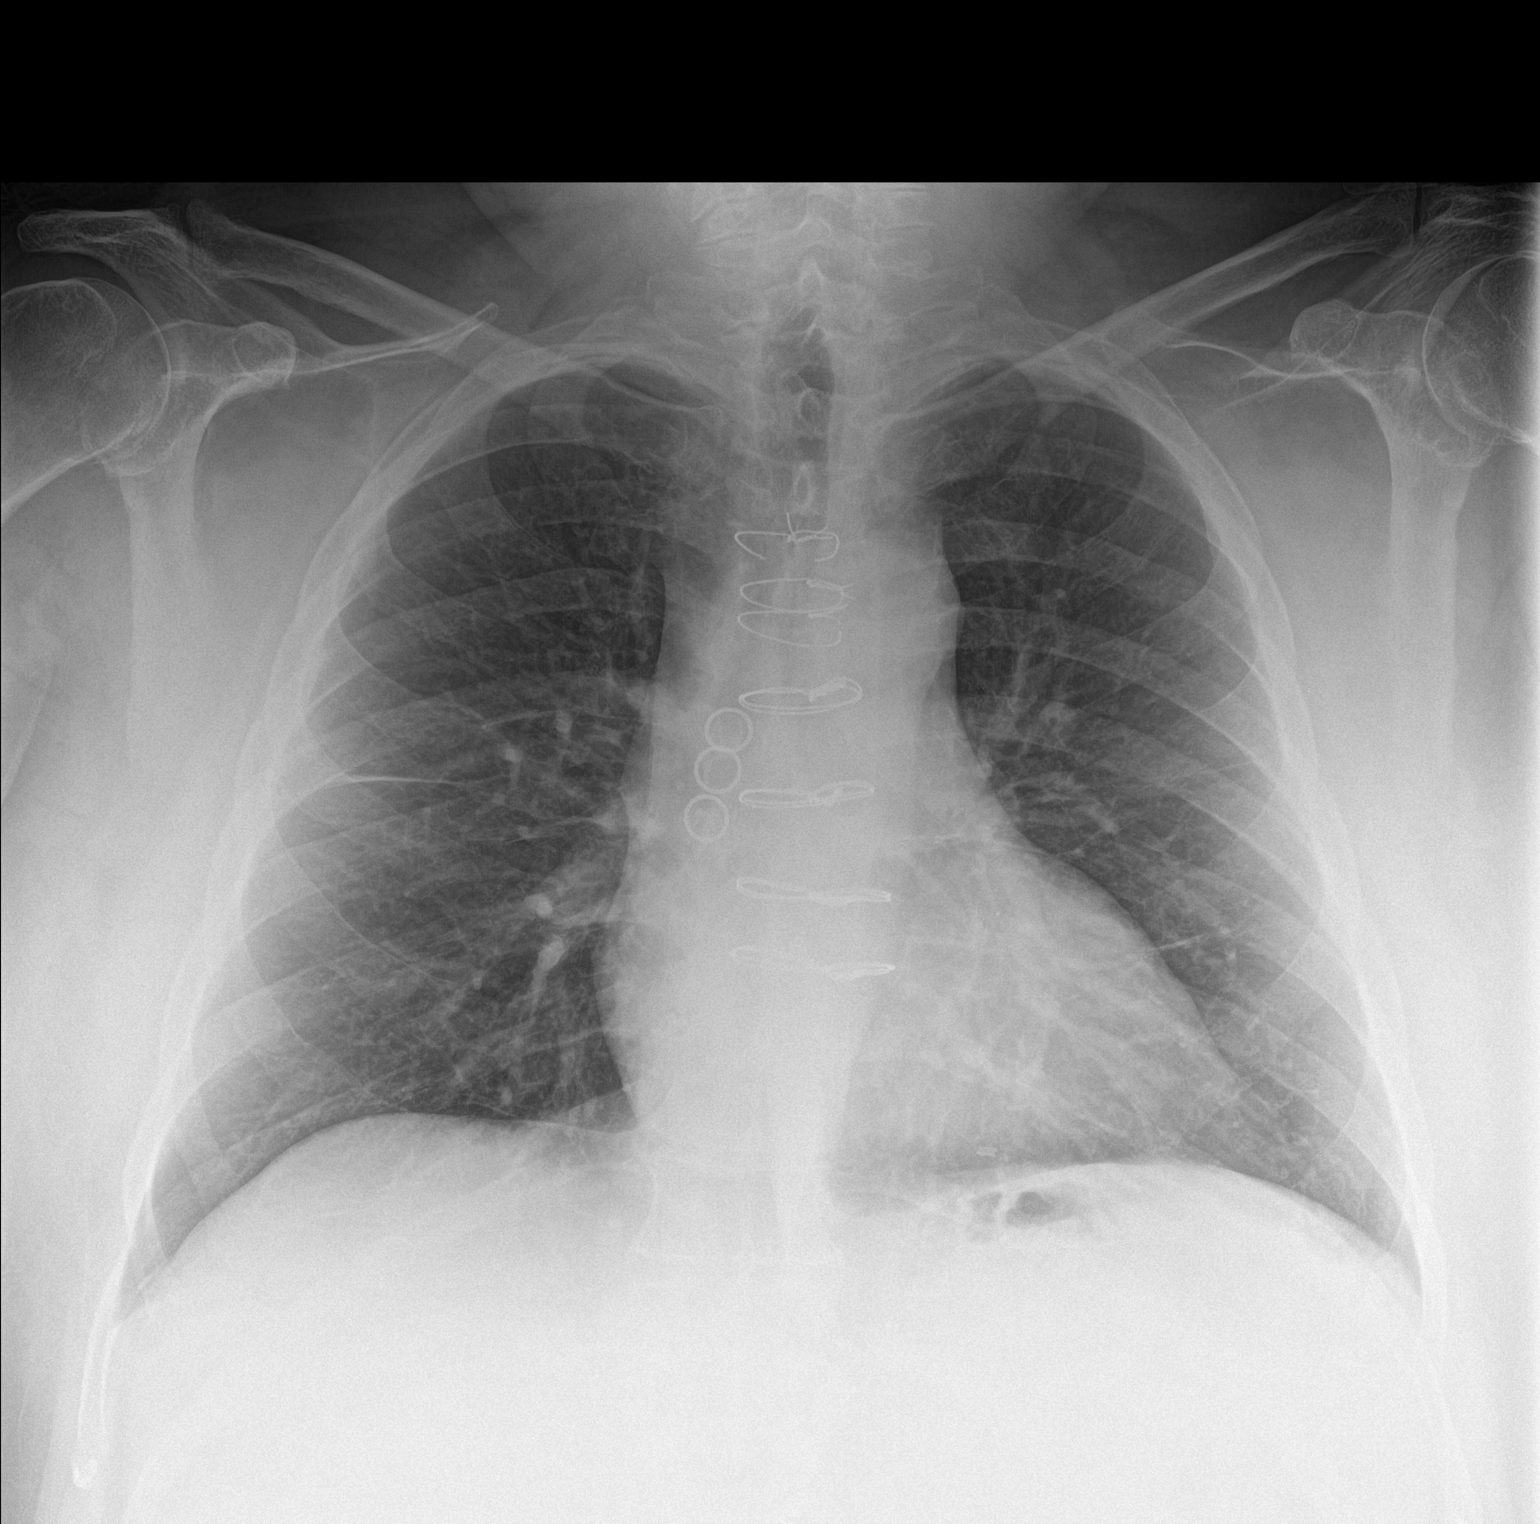
[im 2/2]
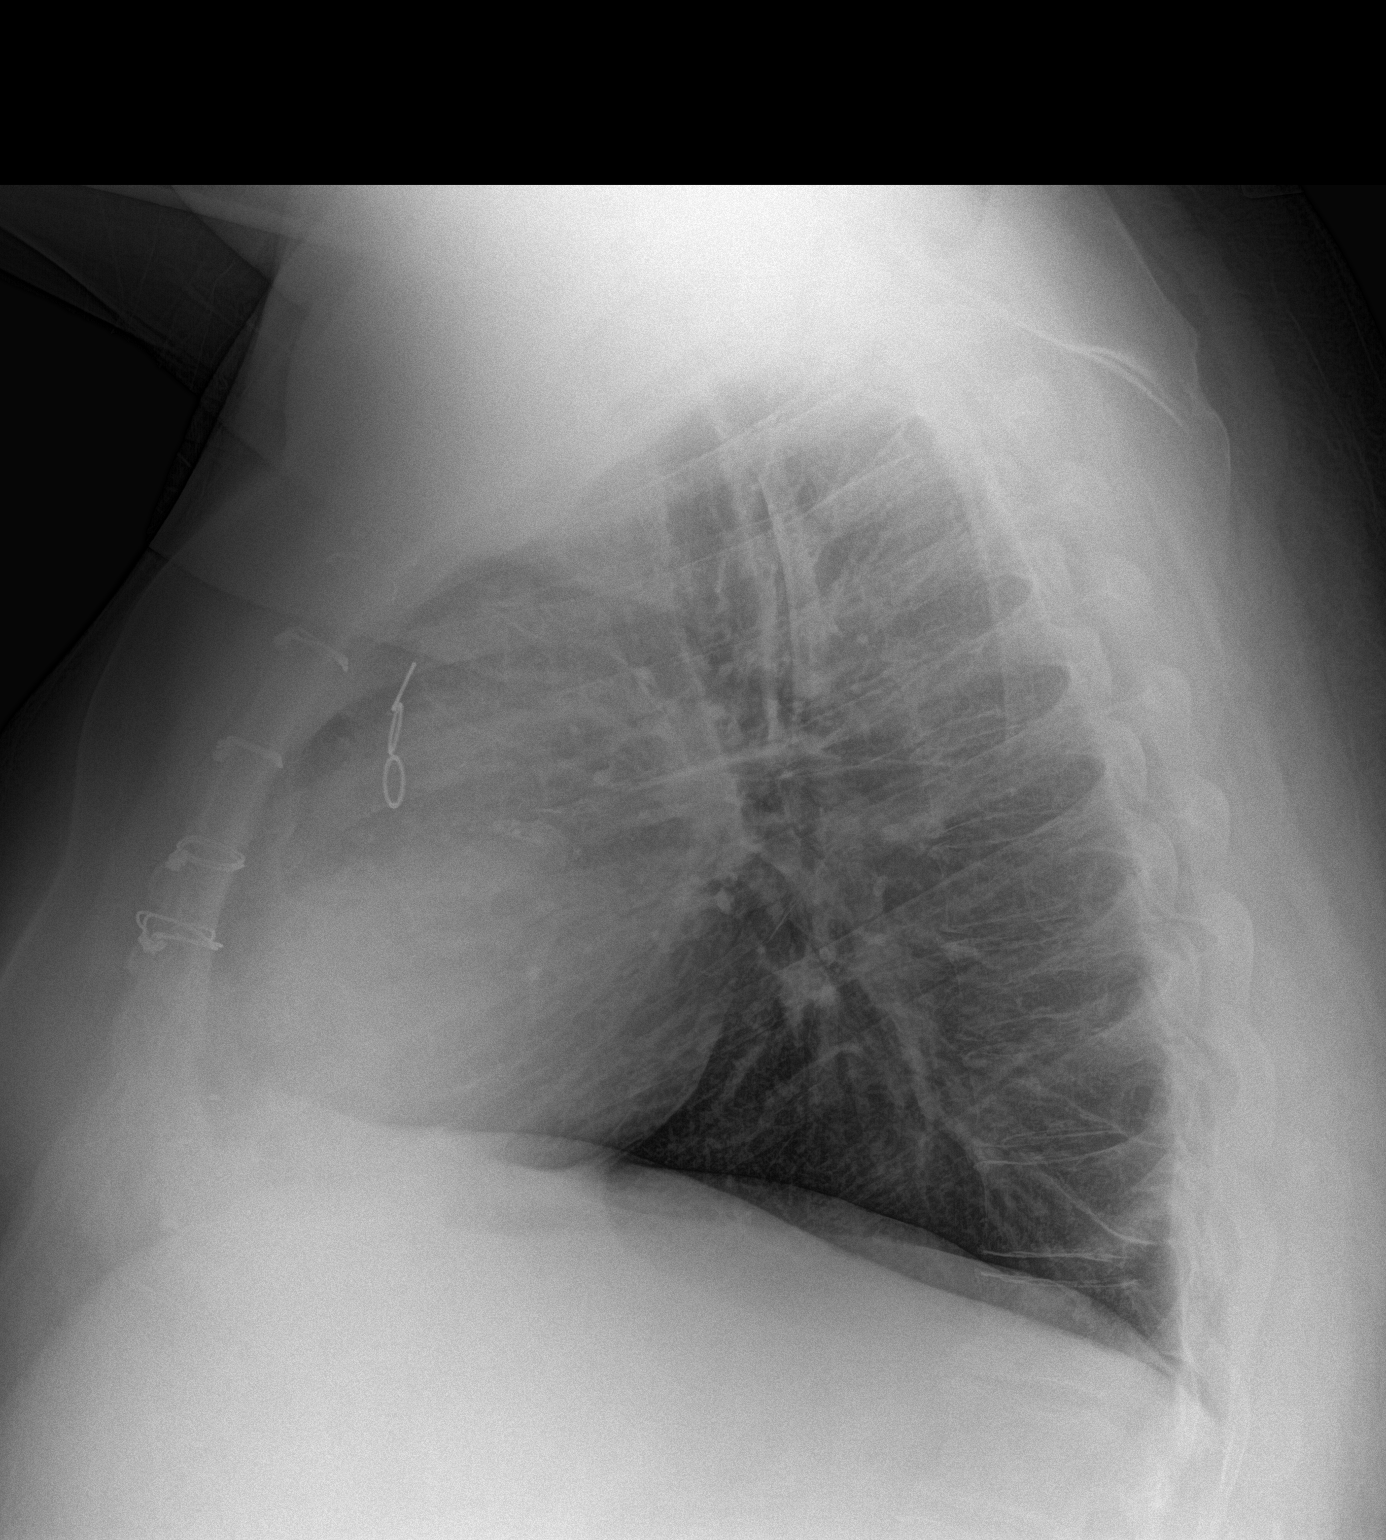

[2 of 2 positions shown; findings below may reference images not displayed]

FINDINGS: Heart size is upper normal, stable. Overall cardiomediastinal
silhouette is stable. Atherosclerotic changes again noted at the
aortic arch. Median sternotomy wires are stable in position, for
presumed CABG.

Lungs are clear. Lung volumes are normal. No pleural effusion or
pneumothorax seen. Tracheal air column in the mid line. No acute or
suspicious osseous finding.
IMPRESSION: No active cardiopulmonary disease. No evidence of pneumonia or
pulmonary edema.

## 2018-03-16 IMAGING — CT CT ANGIO CHEST
2 of 6 series · 18 of 46 positions shown · IV contrast (APPLIED)
Comparison: None.

CLINICAL DATA: Chest pain worsening cough with choking sensation

EXAM:
CT ANGIOGRAPHY CHEST WITH CONTRAST
TECHNIQUE: Multidetector CT imaging of the chest was performed using the
standard protocol during bolus administration of intravenous
contrast. Multiplanar CT image reconstructions and MIPs were
obtained to evaluate the vascular anatomy.
CONTRAST:  75 mL Isovue 370.

[Series 8: thins · axial · 0.79mm/px · z∈[-532,-280]mm · 16 of 278 slices shown]
[im 13/278  lung]
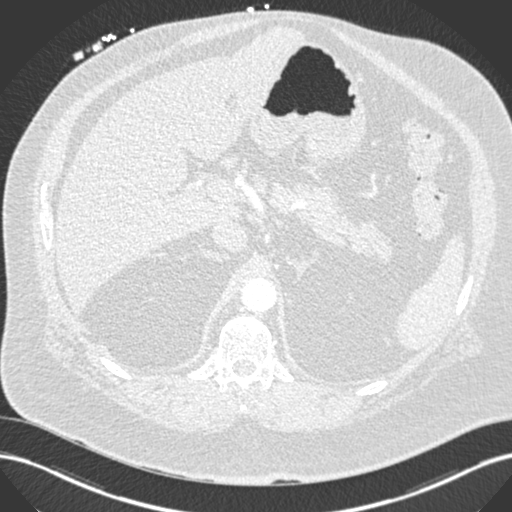
[im 37/278  soft-tissue]
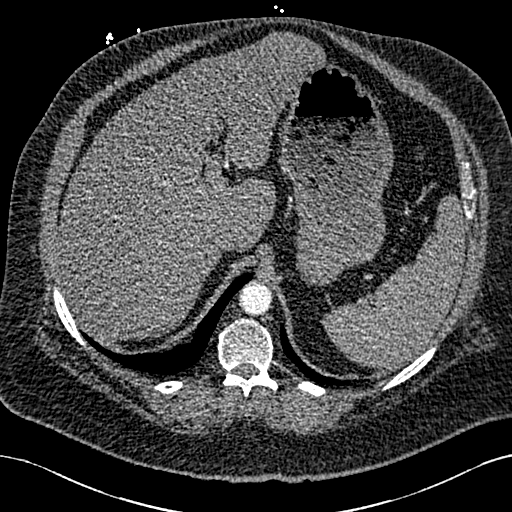
[im 49/278  lung]
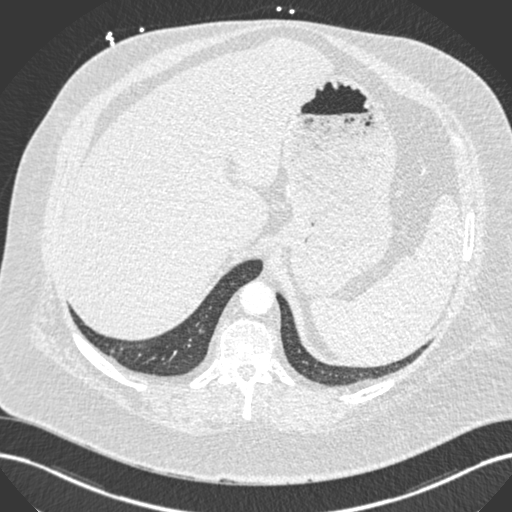
[im 61/278  soft-tissue]
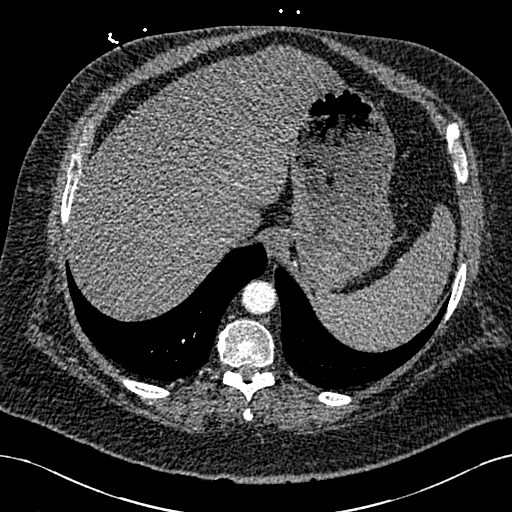
[im 85/278  lung]
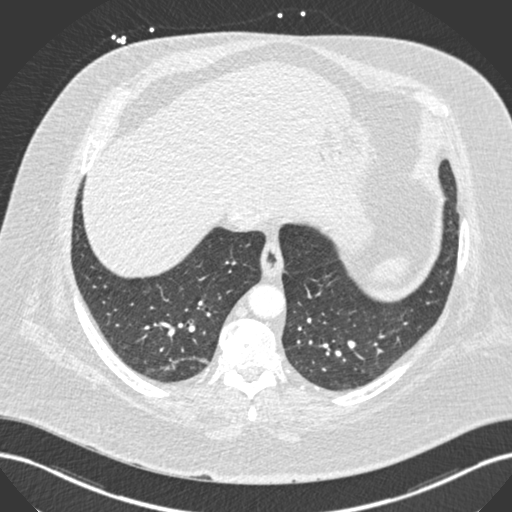
[im 97/278  soft-tissue]
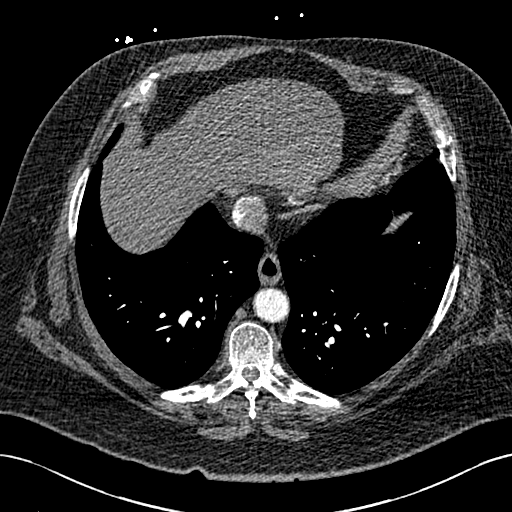
[im 109/278  lung]
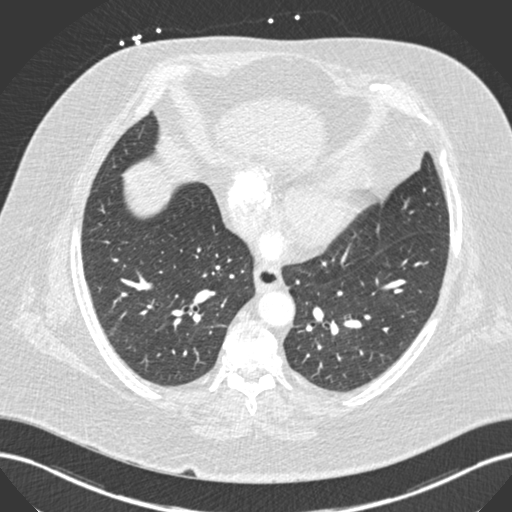
[im 133/278  soft-tissue]
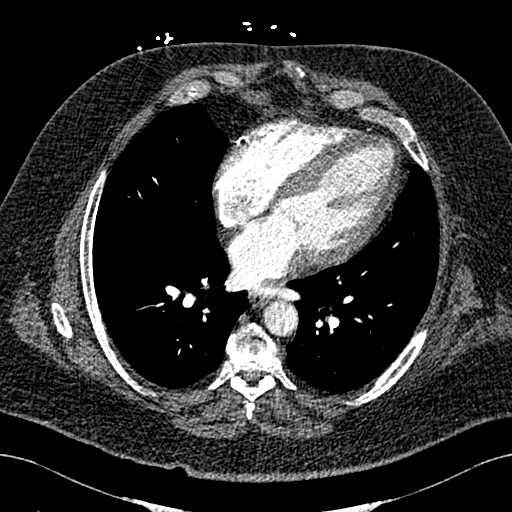
[im 145/278  lung]
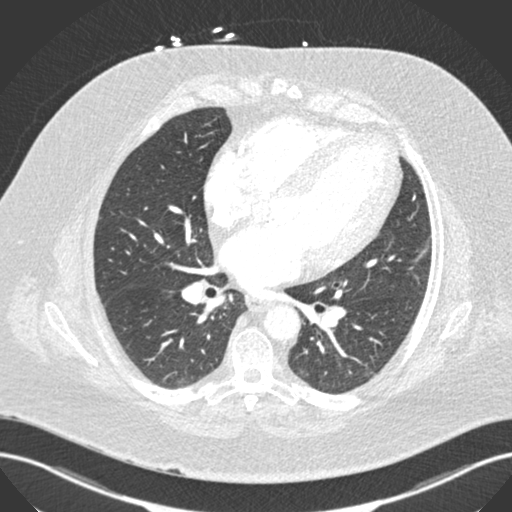
[im 169/278  soft-tissue]
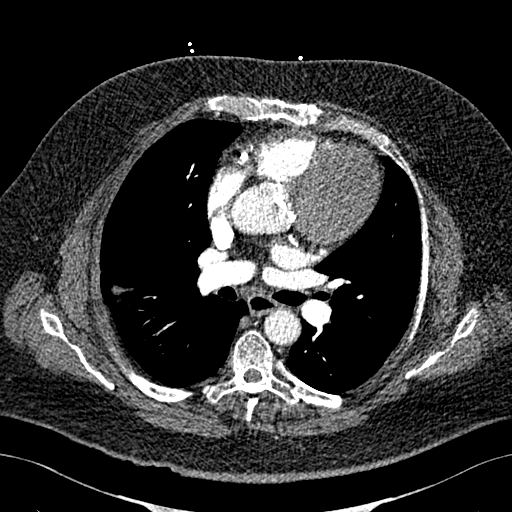
[im 181/278  lung]
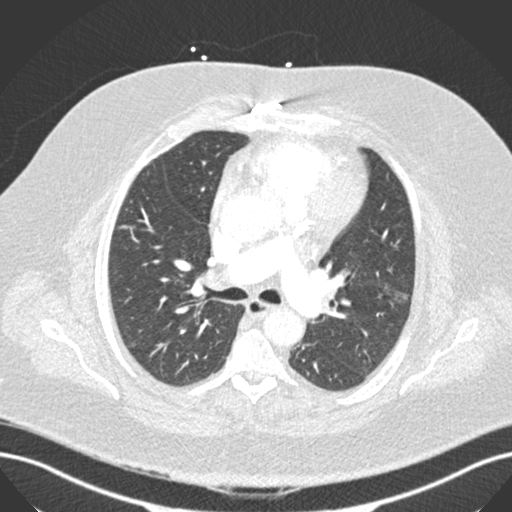
[im 193/278  soft-tissue]
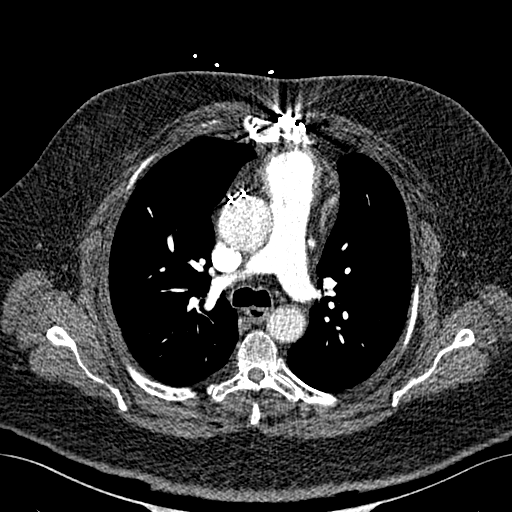
[im 217/278  lung]
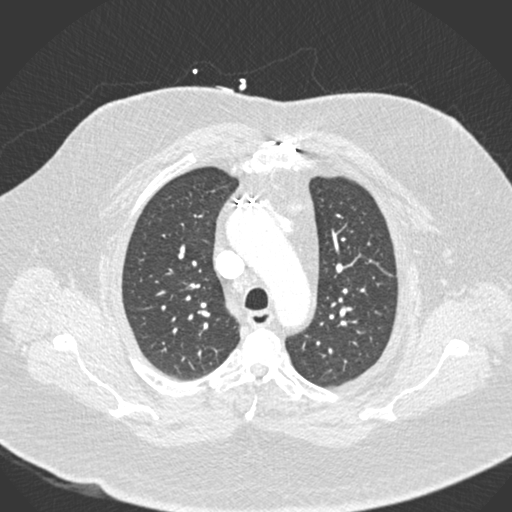
[im 229/278  soft-tissue]
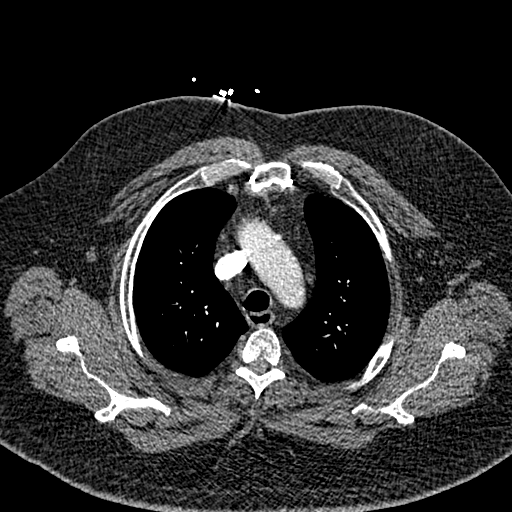
[im 241/278  lung]
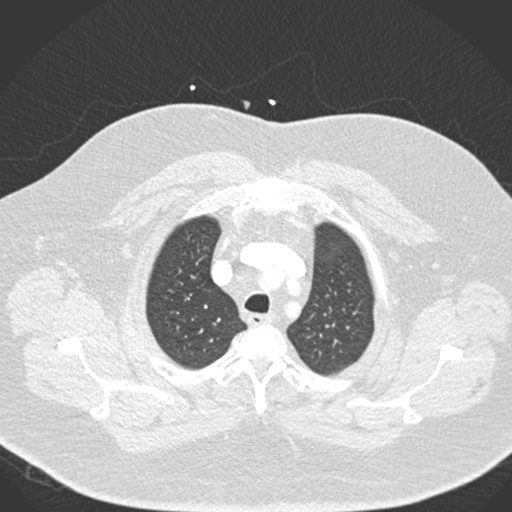
[im 265/278  soft-tissue]
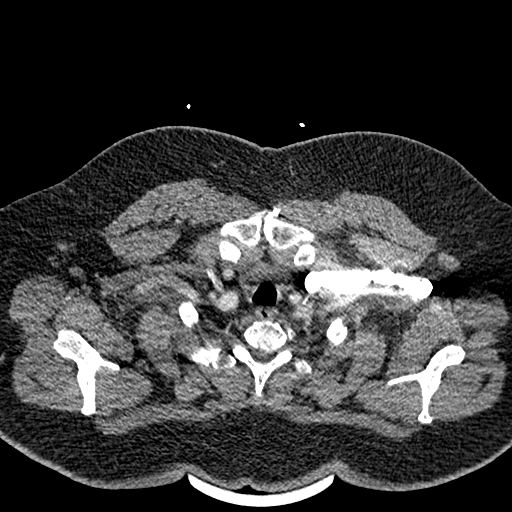

[Series 10: coronal mpr · coronal · 0.55mm/px · 2 of 119 slices shown]
[im 40/119  soft-tissue]
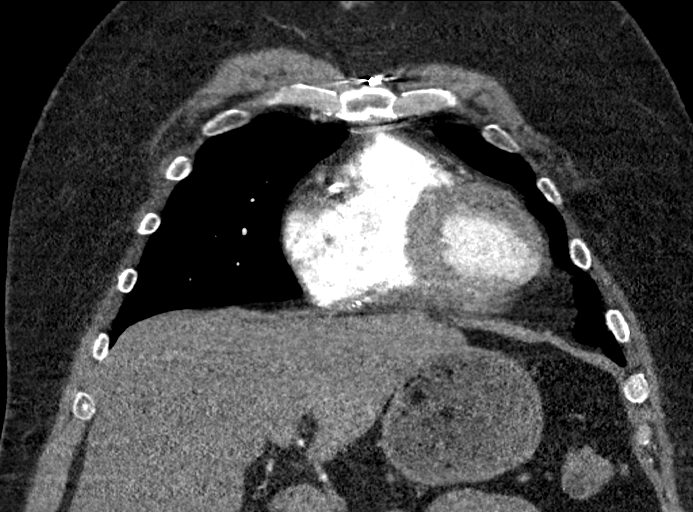
[im 79/119  soft-tissue]
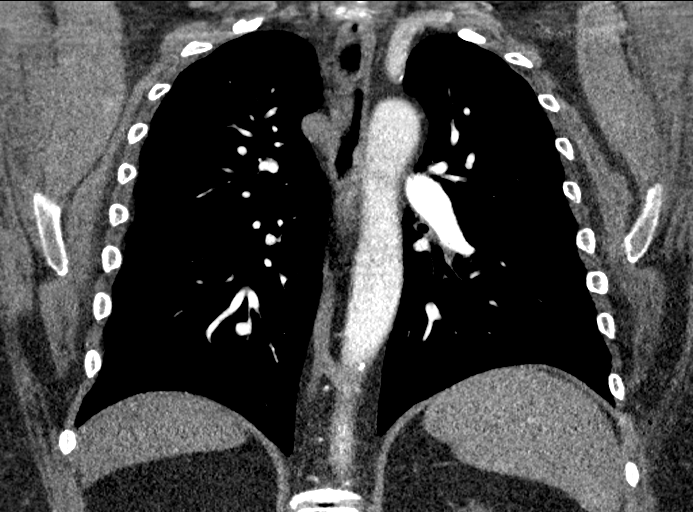

[18 of 46 positions shown; findings below may reference images not displayed]

FINDINGS: Cardiovascular: Thoracic aorta shows changes consistent with
coronary bypass grafting. Mild atherosclerotic calcifications are
seen. No aneurysmal dilatation or dissection is noted. Pulmonary
artery is well visualized with a normal branching pattern. No
filling defects are identified

Mediastinum/Nodes: The thoracic inlet is within normal limits. No
hilar or mediastinal adenopathy is identified.

Lungs/Pleura: Calcified granulomatous changes are noted. No acute
infiltrate or sizable effusion is noted. No sizable parenchymal
nodule is seen.

Upper Abdomen: Within normal limits.

Musculoskeletal: Mild degenerative change of the thoracic spine is
noted.

Review of the MIP images confirms the above findings.
IMPRESSION: No evidence of pulmonary emboli.

Prior granulomatous disease.

No acute abnormality noted.

## 2018-03-16 IMAGING — CT CT NECK W/ CM
4 of 5 series · 15 of 33 positions shown, 17 images · IV contrast (isovue)
Comparison: Neck MRI 02/24/2012

CLINICAL DATA: Choking sensation with trouble swallowing.

EXAM:
CT NECK WITH CONTRAST
TECHNIQUE: Multidetector CT imaging of the neck was performed using the
standard protocol following the bolus administration of intravenous
contrast.
CONTRAST:  Study 5 cc Isovue 370 intravenous

[Series 3: axial neck · axial · 0.56mm/px · z∈[-281,-155]mm · 4 of 105 slices shown, 5 images]
[im 21/105  soft-tissue]
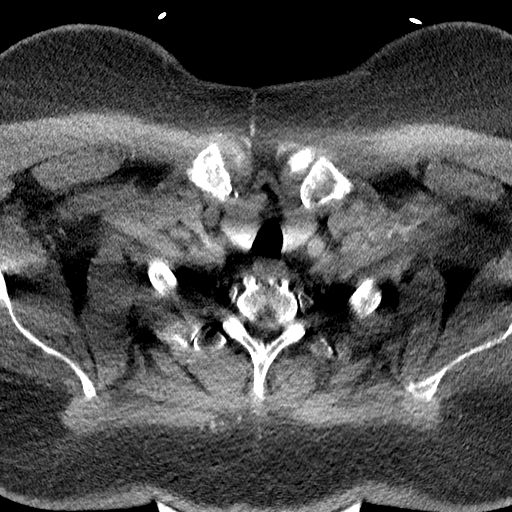
[im 21/105  bone]
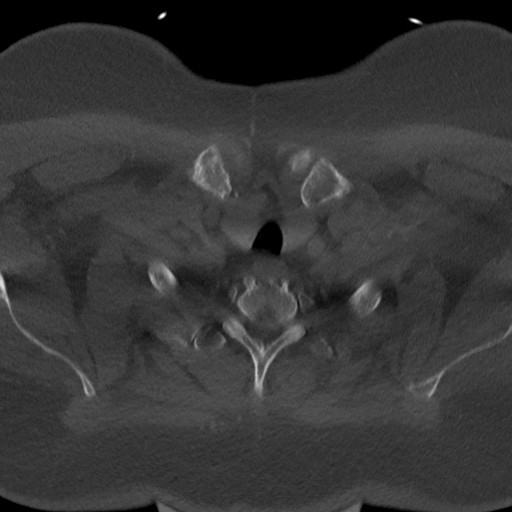
[im 42/105  bone]
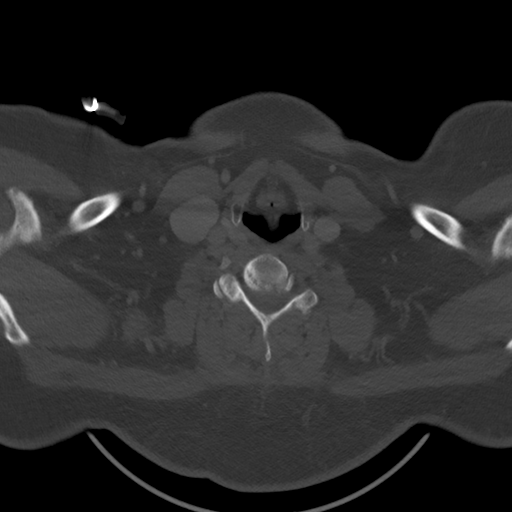
[im 63/105  bone]
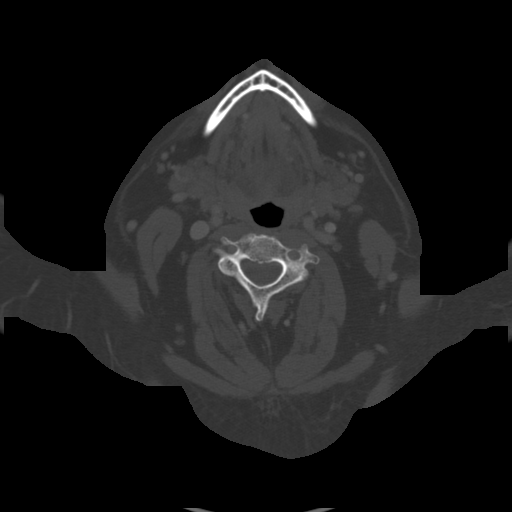
[im 84/105  bone]
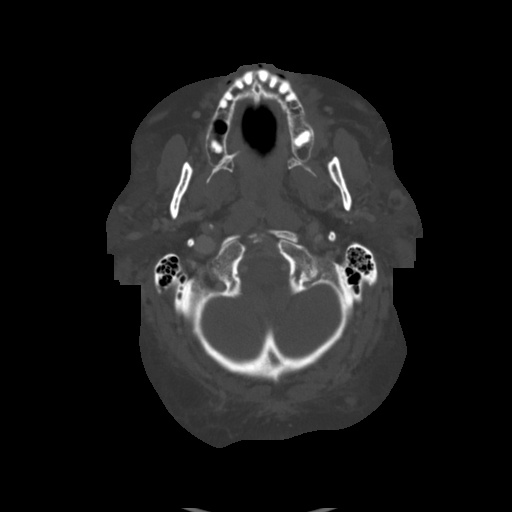

[Series 7: sag neck · sagittal · 0.44mm/px · 5 of 103 slices shown, 6 images]
[im 35/103  bone]
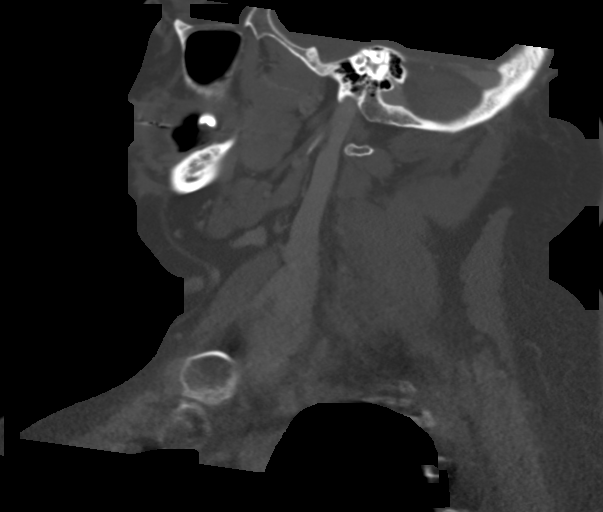
[im 43/103  bone]
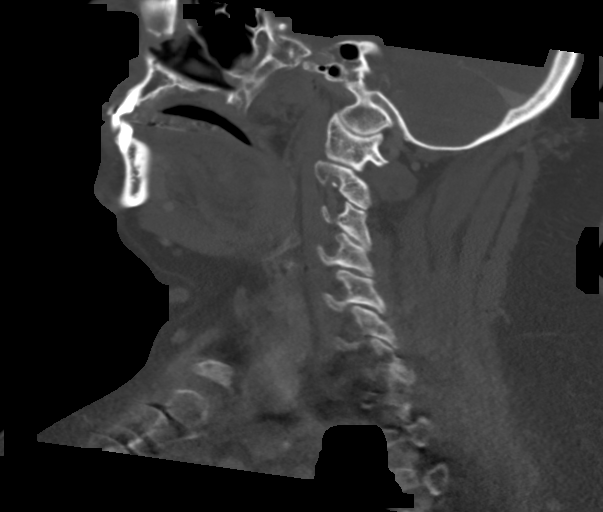
[im 52/103  soft-tissue]
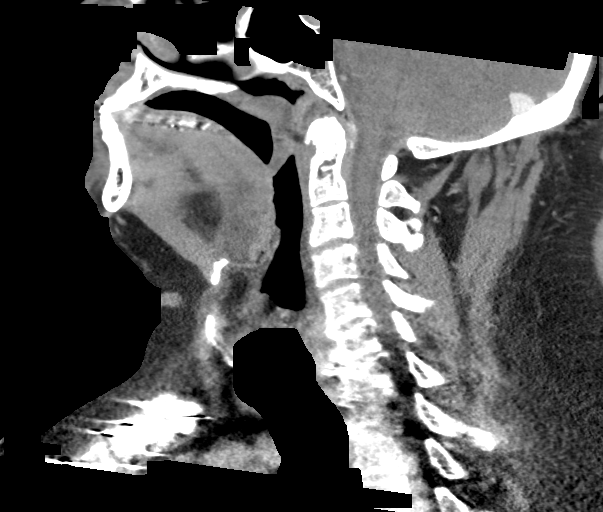
[im 52/103  bone]
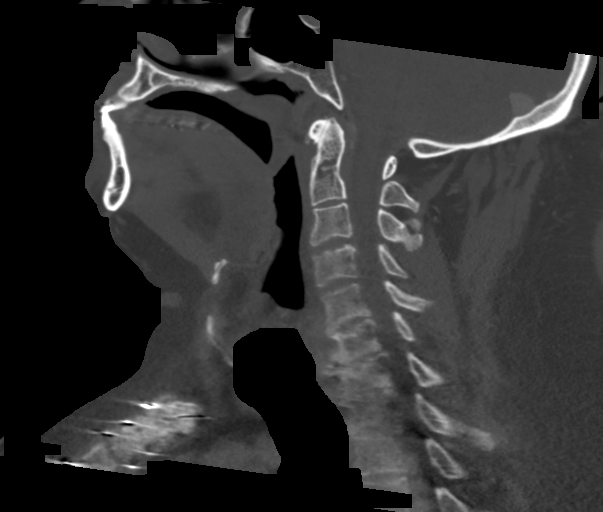
[im 60/103  bone]
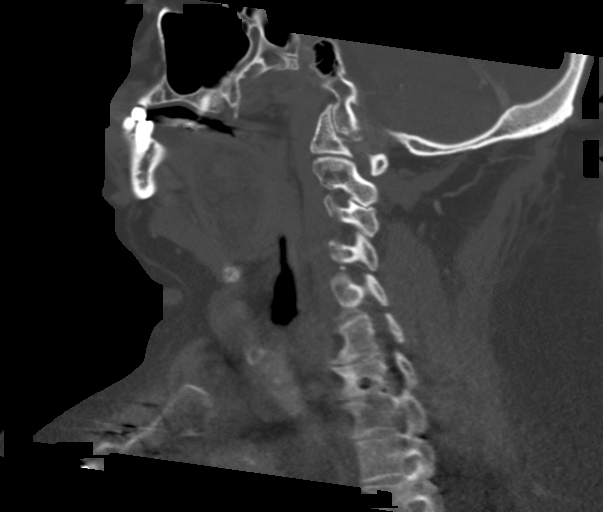
[im 69/103  bone]
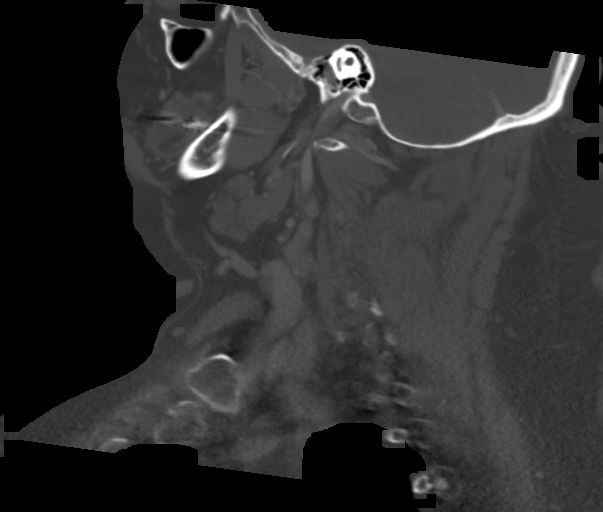

[Series 8: cor neck · coronal · 0.41mm/px · 3 of 116 slices shown]
[im 24/116  bone]
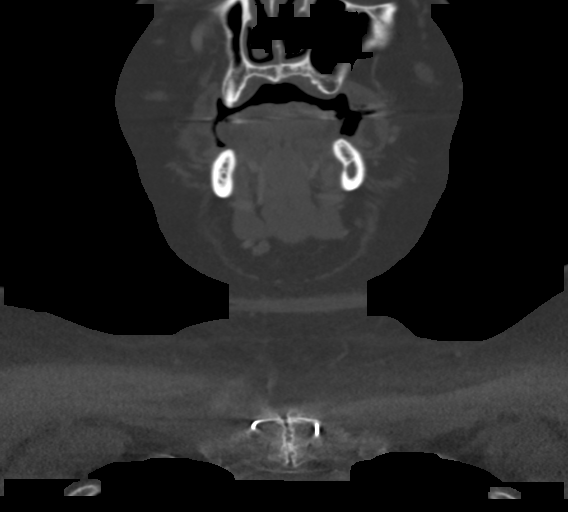
[im 47/116  bone]
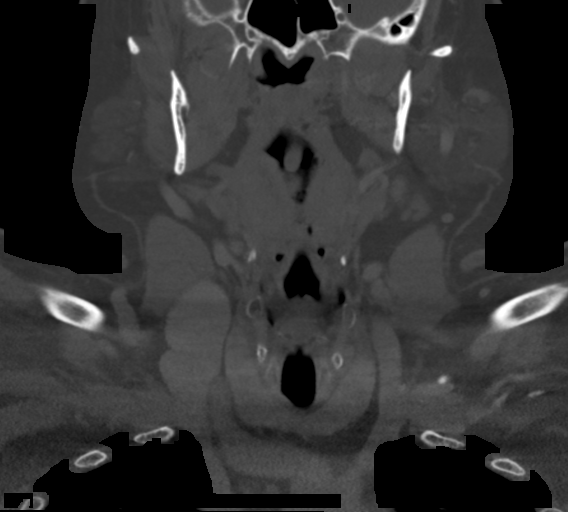
[im 70/116  bone]
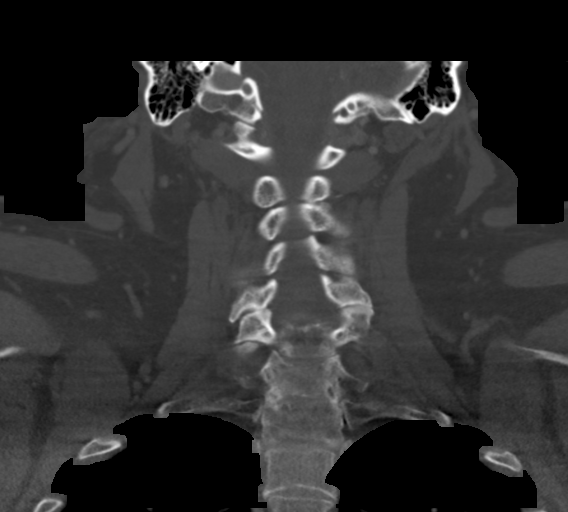

[Series 9: orthogonal ax · axial · 0.39mm/px · z∈[-295,-211]mm · 3 of 106 slices shown]
[im 22/106  bone]
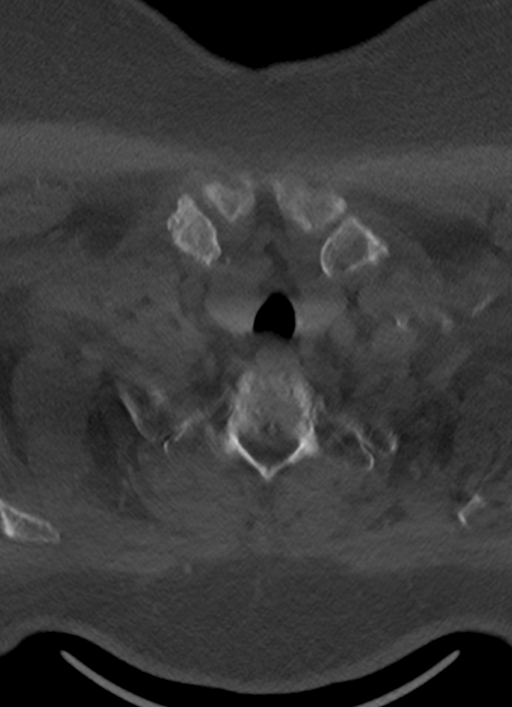
[im 43/106  bone]
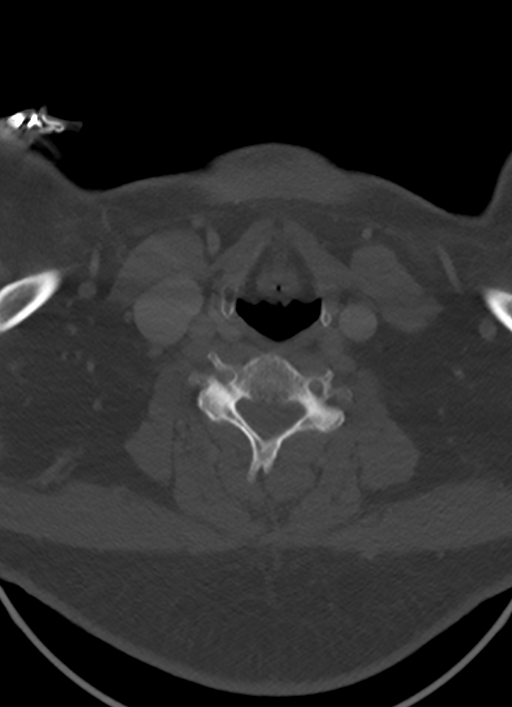
[im 64/106  bone]
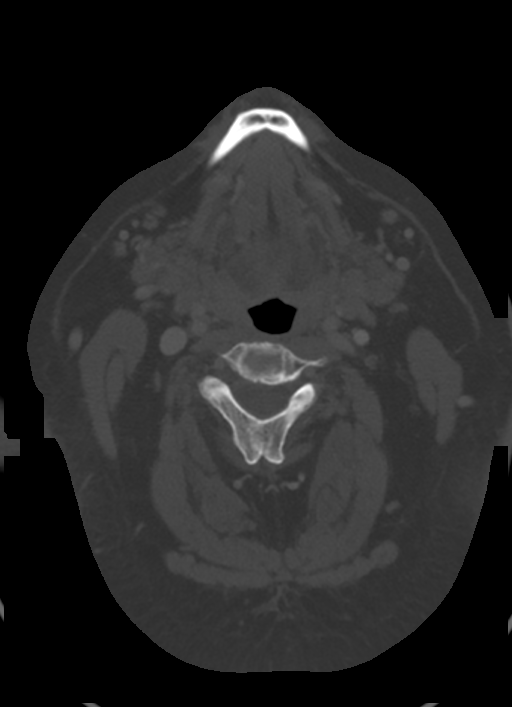

[15 of 33 positions shown; findings below may reference images not displayed]

FINDINGS: Pharynx and larynx: Negative.  No mass or swelling

Salivary glands: No inflammation, mass, or stone.

Thyroid: Normal.

Lymph nodes: None enlarged or abnormal density.

Vascular: Carotid bifurcation atherosclerosis.

Limited intracranial: Negative

Visualized orbits: Negative

Mastoids and visualized paranasal sinuses: Clear

Skeleton: Incomplete segmentation at C2-3. No acute or aggressive
finding.

Upper chest: Negative

Other: Superficial, simple lipoma in the low left posterior neck as
described on comparison MRI.
IMPRESSION: No acute finding or explanation for symptoms.

## 2018-03-17 DIAGNOSIS — I4891 Unspecified atrial fibrillation: Secondary | ICD-10-CM | POA: Diagnosis not present

## 2018-03-17 DIAGNOSIS — I10 Essential (primary) hypertension: Secondary | ICD-10-CM | POA: Diagnosis not present

## 2018-03-17 DIAGNOSIS — F418 Other specified anxiety disorders: Secondary | ICD-10-CM | POA: Diagnosis not present

## 2018-03-17 DIAGNOSIS — I2510 Atherosclerotic heart disease of native coronary artery without angina pectoris: Secondary | ICD-10-CM | POA: Diagnosis not present

## 2018-03-17 DIAGNOSIS — E119 Type 2 diabetes mellitus without complications: Secondary | ICD-10-CM | POA: Diagnosis not present

## 2018-03-17 DIAGNOSIS — J449 Chronic obstructive pulmonary disease, unspecified: Secondary | ICD-10-CM | POA: Diagnosis not present

## 2018-03-17 DIAGNOSIS — D649 Anemia, unspecified: Secondary | ICD-10-CM | POA: Diagnosis not present

## 2018-03-23 DIAGNOSIS — J811 Chronic pulmonary edema: Secondary | ICD-10-CM | POA: Diagnosis not present

## 2018-03-23 DIAGNOSIS — Z951 Presence of aortocoronary bypass graft: Secondary | ICD-10-CM | POA: Diagnosis not present

## 2018-03-23 DIAGNOSIS — I252 Old myocardial infarction: Secondary | ICD-10-CM | POA: Diagnosis not present

## 2018-03-23 DIAGNOSIS — Z9889 Other specified postprocedural states: Secondary | ICD-10-CM | POA: Diagnosis not present

## 2018-03-23 DIAGNOSIS — I459 Conduction disorder, unspecified: Secondary | ICD-10-CM | POA: Diagnosis not present

## 2018-03-23 DIAGNOSIS — I472 Ventricular tachycardia: Secondary | ICD-10-CM | POA: Diagnosis not present

## 2018-03-23 DIAGNOSIS — I4891 Unspecified atrial fibrillation: Secondary | ICD-10-CM | POA: Diagnosis not present

## 2018-03-23 DIAGNOSIS — T8131XD Disruption of external operation (surgical) wound, not elsewhere classified, subsequent encounter: Secondary | ICD-10-CM | POA: Diagnosis not present

## 2018-03-23 DIAGNOSIS — I350 Nonrheumatic aortic (valve) stenosis: Secondary | ICD-10-CM | POA: Diagnosis not present

## 2018-03-23 DIAGNOSIS — R9431 Abnormal electrocardiogram [ECG] [EKG]: Secondary | ICD-10-CM | POA: Diagnosis not present

## 2018-03-26 ENCOUNTER — Other Ambulatory Visit
Admission: RE | Admit: 2018-03-26 | Discharge: 2018-03-26 | Disposition: A | Discharge status: Home / Self Care | Payer: Medicare HMO | Source: Ambulatory Visit | Attending: Family Medicine | Admitting: Family Medicine

## 2018-03-26 DIAGNOSIS — E1142 Type 2 diabetes mellitus with diabetic polyneuropathy: Secondary | ICD-10-CM | POA: Diagnosis not present

## 2018-03-26 DIAGNOSIS — J449 Chronic obstructive pulmonary disease, unspecified: Secondary | ICD-10-CM | POA: Diagnosis not present

## 2018-03-26 DIAGNOSIS — T8131XD Disruption of external operation (surgical) wound, not elsewhere classified, subsequent encounter: Secondary | ICD-10-CM | POA: Diagnosis not present

## 2018-03-26 DIAGNOSIS — I2510 Atherosclerotic heart disease of native coronary artery without angina pectoris: Secondary | ICD-10-CM | POA: Diagnosis not present

## 2018-03-26 DIAGNOSIS — I4891 Unspecified atrial fibrillation: Secondary | ICD-10-CM | POA: Diagnosis not present

## 2018-03-26 DIAGNOSIS — I509 Heart failure, unspecified: Secondary | ICD-10-CM | POA: Insufficient documentation

## 2018-03-26 DIAGNOSIS — I10 Essential (primary) hypertension: Secondary | ICD-10-CM | POA: Diagnosis not present

## 2018-03-26 DIAGNOSIS — F418 Other specified anxiety disorders: Secondary | ICD-10-CM | POA: Diagnosis not present

## 2018-03-26 LAB — BRAIN NATRIURETIC PEPTIDE: B NATRIURETIC PEPTIDE 5: 1233 pg/mL — AB (ref 0.0–100.0)

## 2018-03-29 DIAGNOSIS — I2510 Atherosclerotic heart disease of native coronary artery without angina pectoris: Secondary | ICD-10-CM | POA: Diagnosis not present

## 2018-03-29 DIAGNOSIS — I480 Paroxysmal atrial fibrillation: Secondary | ICD-10-CM | POA: Diagnosis not present

## 2018-03-29 DIAGNOSIS — F319 Bipolar disorder, unspecified: Secondary | ICD-10-CM | POA: Diagnosis not present

## 2018-03-29 DIAGNOSIS — E119 Type 2 diabetes mellitus without complications: Secondary | ICD-10-CM | POA: Diagnosis not present

## 2018-03-29 DIAGNOSIS — I501 Left ventricular failure: Secondary | ICD-10-CM | POA: Diagnosis not present

## 2018-03-29 DIAGNOSIS — J9611 Chronic respiratory failure with hypoxia: Secondary | ICD-10-CM | POA: Diagnosis not present

## 2018-03-29 DIAGNOSIS — I110 Hypertensive heart disease with heart failure: Secondary | ICD-10-CM | POA: Diagnosis not present

## 2018-03-29 DIAGNOSIS — J449 Chronic obstructive pulmonary disease, unspecified: Secondary | ICD-10-CM | POA: Diagnosis not present

## 2018-03-29 DIAGNOSIS — F419 Anxiety disorder, unspecified: Secondary | ICD-10-CM | POA: Diagnosis not present

## 2018-03-30 DIAGNOSIS — I480 Paroxysmal atrial fibrillation: Secondary | ICD-10-CM | POA: Diagnosis not present

## 2018-03-30 DIAGNOSIS — I110 Hypertensive heart disease with heart failure: Secondary | ICD-10-CM | POA: Diagnosis not present

## 2018-03-30 DIAGNOSIS — J449 Chronic obstructive pulmonary disease, unspecified: Secondary | ICD-10-CM | POA: Diagnosis not present

## 2018-03-30 DIAGNOSIS — E119 Type 2 diabetes mellitus without complications: Secondary | ICD-10-CM | POA: Diagnosis not present

## 2018-03-30 DIAGNOSIS — F419 Anxiety disorder, unspecified: Secondary | ICD-10-CM | POA: Diagnosis not present

## 2018-03-30 DIAGNOSIS — I2510 Atherosclerotic heart disease of native coronary artery without angina pectoris: Secondary | ICD-10-CM | POA: Diagnosis not present

## 2018-03-30 DIAGNOSIS — I501 Left ventricular failure: Secondary | ICD-10-CM | POA: Diagnosis not present

## 2018-03-30 DIAGNOSIS — J9611 Chronic respiratory failure with hypoxia: Secondary | ICD-10-CM | POA: Diagnosis not present

## 2018-03-30 DIAGNOSIS — F319 Bipolar disorder, unspecified: Secondary | ICD-10-CM | POA: Diagnosis not present

## 2018-03-31 DIAGNOSIS — E119 Type 2 diabetes mellitus without complications: Secondary | ICD-10-CM | POA: Diagnosis not present

## 2018-03-31 DIAGNOSIS — I480 Paroxysmal atrial fibrillation: Secondary | ICD-10-CM | POA: Diagnosis not present

## 2018-03-31 DIAGNOSIS — J449 Chronic obstructive pulmonary disease, unspecified: Secondary | ICD-10-CM | POA: Diagnosis not present

## 2018-03-31 DIAGNOSIS — I110 Hypertensive heart disease with heart failure: Secondary | ICD-10-CM | POA: Diagnosis not present

## 2018-03-31 DIAGNOSIS — F319 Bipolar disorder, unspecified: Secondary | ICD-10-CM | POA: Diagnosis not present

## 2018-03-31 DIAGNOSIS — T8131XA Disruption of external operation (surgical) wound, not elsewhere classified, initial encounter: Secondary | ICD-10-CM | POA: Diagnosis not present

## 2018-03-31 DIAGNOSIS — F419 Anxiety disorder, unspecified: Secondary | ICD-10-CM | POA: Diagnosis not present

## 2018-03-31 DIAGNOSIS — I2510 Atherosclerotic heart disease of native coronary artery without angina pectoris: Secondary | ICD-10-CM | POA: Diagnosis not present

## 2018-03-31 DIAGNOSIS — J9611 Chronic respiratory failure with hypoxia: Secondary | ICD-10-CM | POA: Diagnosis not present

## 2018-03-31 DIAGNOSIS — I501 Left ventricular failure: Secondary | ICD-10-CM | POA: Diagnosis not present

## 2018-04-01 DIAGNOSIS — I4892 Unspecified atrial flutter: Secondary | ICD-10-CM | POA: Diagnosis not present

## 2018-04-01 DIAGNOSIS — J449 Chronic obstructive pulmonary disease, unspecified: Secondary | ICD-10-CM | POA: Diagnosis not present

## 2018-04-01 DIAGNOSIS — F319 Bipolar disorder, unspecified: Secondary | ICD-10-CM | POA: Diagnosis not present

## 2018-04-01 DIAGNOSIS — I2510 Atherosclerotic heart disease of native coronary artery without angina pectoris: Secondary | ICD-10-CM | POA: Diagnosis not present

## 2018-04-01 DIAGNOSIS — M6281 Muscle weakness (generalized): Secondary | ICD-10-CM | POA: Diagnosis not present

## 2018-04-01 DIAGNOSIS — F419 Anxiety disorder, unspecified: Secondary | ICD-10-CM | POA: Diagnosis not present

## 2018-04-01 DIAGNOSIS — I480 Paroxysmal atrial fibrillation: Secondary | ICD-10-CM | POA: Diagnosis not present

## 2018-04-01 DIAGNOSIS — I110 Hypertensive heart disease with heart failure: Secondary | ICD-10-CM | POA: Diagnosis not present

## 2018-04-01 DIAGNOSIS — J9611 Chronic respiratory failure with hypoxia: Secondary | ICD-10-CM | POA: Diagnosis not present

## 2018-04-01 DIAGNOSIS — I501 Left ventricular failure: Secondary | ICD-10-CM | POA: Diagnosis not present

## 2018-04-01 DIAGNOSIS — G4733 Obstructive sleep apnea (adult) (pediatric): Secondary | ICD-10-CM | POA: Diagnosis not present

## 2018-04-01 DIAGNOSIS — J158 Pneumonia due to other specified bacteria: Secondary | ICD-10-CM | POA: Diagnosis not present

## 2018-04-01 DIAGNOSIS — R0602 Shortness of breath: Secondary | ICD-10-CM | POA: Diagnosis not present

## 2018-04-01 DIAGNOSIS — E119 Type 2 diabetes mellitus without complications: Secondary | ICD-10-CM | POA: Diagnosis not present

## 2018-04-02 DIAGNOSIS — J9611 Chronic respiratory failure with hypoxia: Secondary | ICD-10-CM | POA: Diagnosis not present

## 2018-04-02 DIAGNOSIS — T8131XA Disruption of external operation (surgical) wound, not elsewhere classified, initial encounter: Secondary | ICD-10-CM | POA: Diagnosis not present

## 2018-04-02 DIAGNOSIS — I110 Hypertensive heart disease with heart failure: Secondary | ICD-10-CM | POA: Diagnosis not present

## 2018-04-02 DIAGNOSIS — I501 Left ventricular failure: Secondary | ICD-10-CM | POA: Diagnosis not present

## 2018-04-02 DIAGNOSIS — F319 Bipolar disorder, unspecified: Secondary | ICD-10-CM | POA: Diagnosis not present

## 2018-04-02 DIAGNOSIS — J449 Chronic obstructive pulmonary disease, unspecified: Secondary | ICD-10-CM | POA: Diagnosis not present

## 2018-04-02 DIAGNOSIS — I10 Essential (primary) hypertension: Secondary | ICD-10-CM | POA: Diagnosis not present

## 2018-04-02 DIAGNOSIS — I2510 Atherosclerotic heart disease of native coronary artery without angina pectoris: Secondary | ICD-10-CM | POA: Diagnosis not present

## 2018-04-02 DIAGNOSIS — Q231 Congenital insufficiency of aortic valve: Secondary | ICD-10-CM | POA: Diagnosis not present

## 2018-04-02 DIAGNOSIS — F419 Anxiety disorder, unspecified: Secondary | ICD-10-CM | POA: Diagnosis not present

## 2018-04-02 DIAGNOSIS — I480 Paroxysmal atrial fibrillation: Secondary | ICD-10-CM | POA: Diagnosis not present

## 2018-04-02 DIAGNOSIS — E119 Type 2 diabetes mellitus without complications: Secondary | ICD-10-CM | POA: Diagnosis not present

## 2018-04-02 DIAGNOSIS — I63541 Cerebral infarction due to unspecified occlusion or stenosis of right cerebellar artery: Secondary | ICD-10-CM | POA: Diagnosis not present

## 2018-04-02 DIAGNOSIS — I350 Nonrheumatic aortic (valve) stenosis: Secondary | ICD-10-CM | POA: Diagnosis not present

## 2018-04-05 DIAGNOSIS — I110 Hypertensive heart disease with heart failure: Secondary | ICD-10-CM | POA: Diagnosis not present

## 2018-04-05 DIAGNOSIS — F319 Bipolar disorder, unspecified: Secondary | ICD-10-CM | POA: Diagnosis not present

## 2018-04-05 DIAGNOSIS — J449 Chronic obstructive pulmonary disease, unspecified: Secondary | ICD-10-CM | POA: Diagnosis not present

## 2018-04-05 DIAGNOSIS — I480 Paroxysmal atrial fibrillation: Secondary | ICD-10-CM | POA: Diagnosis not present

## 2018-04-05 DIAGNOSIS — J9611 Chronic respiratory failure with hypoxia: Secondary | ICD-10-CM | POA: Diagnosis not present

## 2018-04-05 DIAGNOSIS — I2510 Atherosclerotic heart disease of native coronary artery without angina pectoris: Secondary | ICD-10-CM | POA: Diagnosis not present

## 2018-04-05 DIAGNOSIS — F419 Anxiety disorder, unspecified: Secondary | ICD-10-CM | POA: Diagnosis not present

## 2018-04-05 DIAGNOSIS — I501 Left ventricular failure: Secondary | ICD-10-CM | POA: Diagnosis not present

## 2018-04-05 DIAGNOSIS — E119 Type 2 diabetes mellitus without complications: Secondary | ICD-10-CM | POA: Diagnosis not present

## 2018-04-06 DIAGNOSIS — I480 Paroxysmal atrial fibrillation: Secondary | ICD-10-CM | POA: Diagnosis not present

## 2018-04-06 DIAGNOSIS — I110 Hypertensive heart disease with heart failure: Secondary | ICD-10-CM | POA: Diagnosis not present

## 2018-04-06 DIAGNOSIS — J9611 Chronic respiratory failure with hypoxia: Secondary | ICD-10-CM | POA: Diagnosis not present

## 2018-04-06 DIAGNOSIS — I501 Left ventricular failure: Secondary | ICD-10-CM | POA: Diagnosis not present

## 2018-04-06 DIAGNOSIS — J449 Chronic obstructive pulmonary disease, unspecified: Secondary | ICD-10-CM | POA: Diagnosis not present

## 2018-04-06 DIAGNOSIS — F419 Anxiety disorder, unspecified: Secondary | ICD-10-CM | POA: Diagnosis not present

## 2018-04-06 DIAGNOSIS — E119 Type 2 diabetes mellitus without complications: Secondary | ICD-10-CM | POA: Diagnosis not present

## 2018-04-06 DIAGNOSIS — I2510 Atherosclerotic heart disease of native coronary artery without angina pectoris: Secondary | ICD-10-CM | POA: Diagnosis not present

## 2018-04-06 DIAGNOSIS — F319 Bipolar disorder, unspecified: Secondary | ICD-10-CM | POA: Diagnosis not present

## 2018-04-07 DIAGNOSIS — F319 Bipolar disorder, unspecified: Secondary | ICD-10-CM | POA: Diagnosis not present

## 2018-04-07 DIAGNOSIS — I501 Left ventricular failure: Secondary | ICD-10-CM | POA: Diagnosis not present

## 2018-04-07 DIAGNOSIS — I110 Hypertensive heart disease with heart failure: Secondary | ICD-10-CM | POA: Diagnosis not present

## 2018-04-07 DIAGNOSIS — I480 Paroxysmal atrial fibrillation: Secondary | ICD-10-CM | POA: Diagnosis not present

## 2018-04-07 DIAGNOSIS — I2510 Atherosclerotic heart disease of native coronary artery without angina pectoris: Secondary | ICD-10-CM | POA: Diagnosis not present

## 2018-04-07 DIAGNOSIS — E119 Type 2 diabetes mellitus without complications: Secondary | ICD-10-CM | POA: Diagnosis not present

## 2018-04-07 DIAGNOSIS — F419 Anxiety disorder, unspecified: Secondary | ICD-10-CM | POA: Diagnosis not present

## 2018-04-07 DIAGNOSIS — J449 Chronic obstructive pulmonary disease, unspecified: Secondary | ICD-10-CM | POA: Diagnosis not present

## 2018-04-07 DIAGNOSIS — J9611 Chronic respiratory failure with hypoxia: Secondary | ICD-10-CM | POA: Diagnosis not present

## 2018-04-08 DIAGNOSIS — I480 Paroxysmal atrial fibrillation: Secondary | ICD-10-CM | POA: Diagnosis not present

## 2018-04-08 DIAGNOSIS — I501 Left ventricular failure: Secondary | ICD-10-CM | POA: Diagnosis not present

## 2018-04-08 DIAGNOSIS — I110 Hypertensive heart disease with heart failure: Secondary | ICD-10-CM | POA: Diagnosis not present

## 2018-04-08 DIAGNOSIS — J449 Chronic obstructive pulmonary disease, unspecified: Secondary | ICD-10-CM | POA: Diagnosis not present

## 2018-04-08 DIAGNOSIS — J9611 Chronic respiratory failure with hypoxia: Secondary | ICD-10-CM | POA: Diagnosis not present

## 2018-04-08 DIAGNOSIS — I2510 Atherosclerotic heart disease of native coronary artery without angina pectoris: Secondary | ICD-10-CM | POA: Diagnosis not present

## 2018-04-08 DIAGNOSIS — E119 Type 2 diabetes mellitus without complications: Secondary | ICD-10-CM | POA: Diagnosis not present

## 2018-04-08 DIAGNOSIS — F419 Anxiety disorder, unspecified: Secondary | ICD-10-CM | POA: Diagnosis not present

## 2018-04-08 DIAGNOSIS — F319 Bipolar disorder, unspecified: Secondary | ICD-10-CM | POA: Diagnosis not present

## 2018-04-12 DIAGNOSIS — Z125 Encounter for screening for malignant neoplasm of prostate: Secondary | ICD-10-CM | POA: Diagnosis not present

## 2018-04-12 DIAGNOSIS — Z79899 Other long term (current) drug therapy: Secondary | ICD-10-CM | POA: Diagnosis not present

## 2018-04-12 DIAGNOSIS — E7800 Pure hypercholesterolemia, unspecified: Secondary | ICD-10-CM | POA: Diagnosis not present

## 2018-04-12 DIAGNOSIS — I5033 Acute on chronic diastolic (congestive) heart failure: Secondary | ICD-10-CM | POA: Diagnosis not present

## 2018-04-12 DIAGNOSIS — I10 Essential (primary) hypertension: Secondary | ICD-10-CM | POA: Diagnosis not present

## 2018-04-12 DIAGNOSIS — E1159 Type 2 diabetes mellitus with other circulatory complications: Secondary | ICD-10-CM | POA: Diagnosis not present

## 2018-04-12 DIAGNOSIS — I509 Heart failure, unspecified: Secondary | ICD-10-CM | POA: Diagnosis not present

## 2018-04-12 DIAGNOSIS — G4733 Obstructive sleep apnea (adult) (pediatric): Secondary | ICD-10-CM | POA: Diagnosis not present

## 2018-04-12 DIAGNOSIS — T8130XA Disruption of wound, unspecified, initial encounter: Secondary | ICD-10-CM | POA: Diagnosis not present

## 2018-04-13 DIAGNOSIS — Z951 Presence of aortocoronary bypass graft: Secondary | ICD-10-CM | POA: Diagnosis not present

## 2018-04-13 DIAGNOSIS — J189 Pneumonia, unspecified organism: Secondary | ICD-10-CM | POA: Diagnosis not present

## 2018-04-13 DIAGNOSIS — J90 Pleural effusion, not elsewhere classified: Secondary | ICD-10-CM | POA: Diagnosis not present

## 2018-04-13 DIAGNOSIS — I4891 Unspecified atrial fibrillation: Secondary | ICD-10-CM | POA: Diagnosis not present

## 2018-04-13 DIAGNOSIS — Z952 Presence of prosthetic heart valve: Secondary | ICD-10-CM | POA: Diagnosis not present

## 2018-04-13 DIAGNOSIS — Z48812 Encounter for surgical aftercare following surgery on the circulatory system: Secondary | ICD-10-CM | POA: Diagnosis not present

## 2018-04-14 DIAGNOSIS — F319 Bipolar disorder, unspecified: Secondary | ICD-10-CM | POA: Diagnosis not present

## 2018-04-14 DIAGNOSIS — I2510 Atherosclerotic heart disease of native coronary artery without angina pectoris: Secondary | ICD-10-CM | POA: Diagnosis not present

## 2018-04-14 DIAGNOSIS — E119 Type 2 diabetes mellitus without complications: Secondary | ICD-10-CM | POA: Diagnosis not present

## 2018-04-14 DIAGNOSIS — I501 Left ventricular failure: Secondary | ICD-10-CM | POA: Diagnosis not present

## 2018-04-14 DIAGNOSIS — F419 Anxiety disorder, unspecified: Secondary | ICD-10-CM | POA: Diagnosis not present

## 2018-04-14 DIAGNOSIS — I480 Paroxysmal atrial fibrillation: Secondary | ICD-10-CM | POA: Diagnosis not present

## 2018-04-14 DIAGNOSIS — I110 Hypertensive heart disease with heart failure: Secondary | ICD-10-CM | POA: Diagnosis not present

## 2018-04-14 DIAGNOSIS — J9611 Chronic respiratory failure with hypoxia: Secondary | ICD-10-CM | POA: Diagnosis not present

## 2018-04-14 DIAGNOSIS — J449 Chronic obstructive pulmonary disease, unspecified: Secondary | ICD-10-CM | POA: Diagnosis not present

## 2018-04-15 DIAGNOSIS — I110 Hypertensive heart disease with heart failure: Secondary | ICD-10-CM | POA: Diagnosis not present

## 2018-04-15 DIAGNOSIS — I501 Left ventricular failure: Secondary | ICD-10-CM | POA: Diagnosis not present

## 2018-04-15 DIAGNOSIS — J449 Chronic obstructive pulmonary disease, unspecified: Secondary | ICD-10-CM | POA: Diagnosis not present

## 2018-04-15 DIAGNOSIS — E119 Type 2 diabetes mellitus without complications: Secondary | ICD-10-CM | POA: Diagnosis not present

## 2018-04-15 DIAGNOSIS — F319 Bipolar disorder, unspecified: Secondary | ICD-10-CM | POA: Diagnosis not present

## 2018-04-15 DIAGNOSIS — I2510 Atherosclerotic heart disease of native coronary artery without angina pectoris: Secondary | ICD-10-CM | POA: Diagnosis not present

## 2018-04-15 DIAGNOSIS — J9611 Chronic respiratory failure with hypoxia: Secondary | ICD-10-CM | POA: Diagnosis not present

## 2018-04-15 DIAGNOSIS — I480 Paroxysmal atrial fibrillation: Secondary | ICD-10-CM | POA: Diagnosis not present

## 2018-04-15 DIAGNOSIS — F419 Anxiety disorder, unspecified: Secondary | ICD-10-CM | POA: Diagnosis not present

## 2018-04-15 IMAGING — CR DG CHEST 2V
2 series · 2 of 2 positions shown · non-contrast
Comparison: 07/19/2016

CLINICAL DATA: Left-sided chest pain and arm pain since 2488 hours

EXAM:
CHEST  2 VIEW

[chest pa]
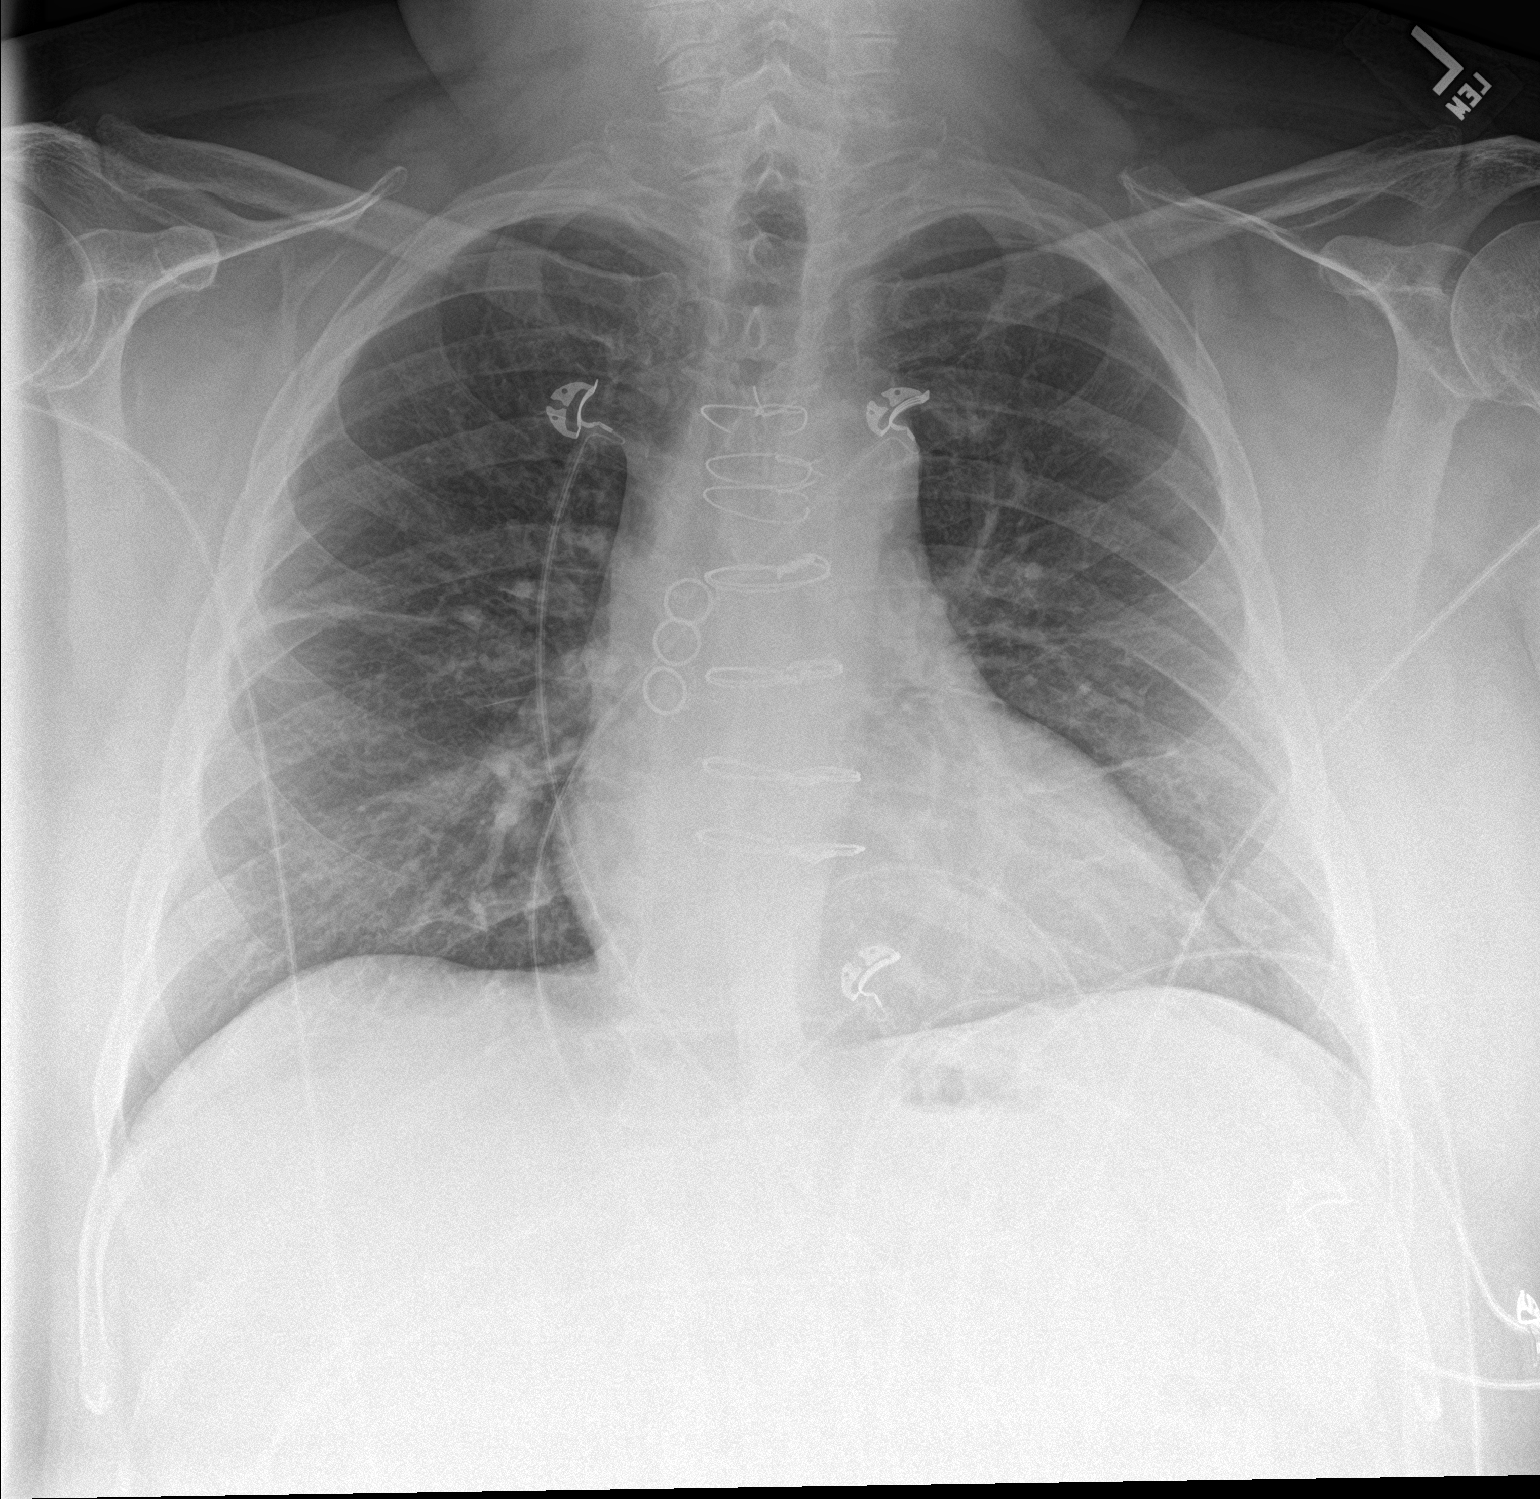

[chest lat]
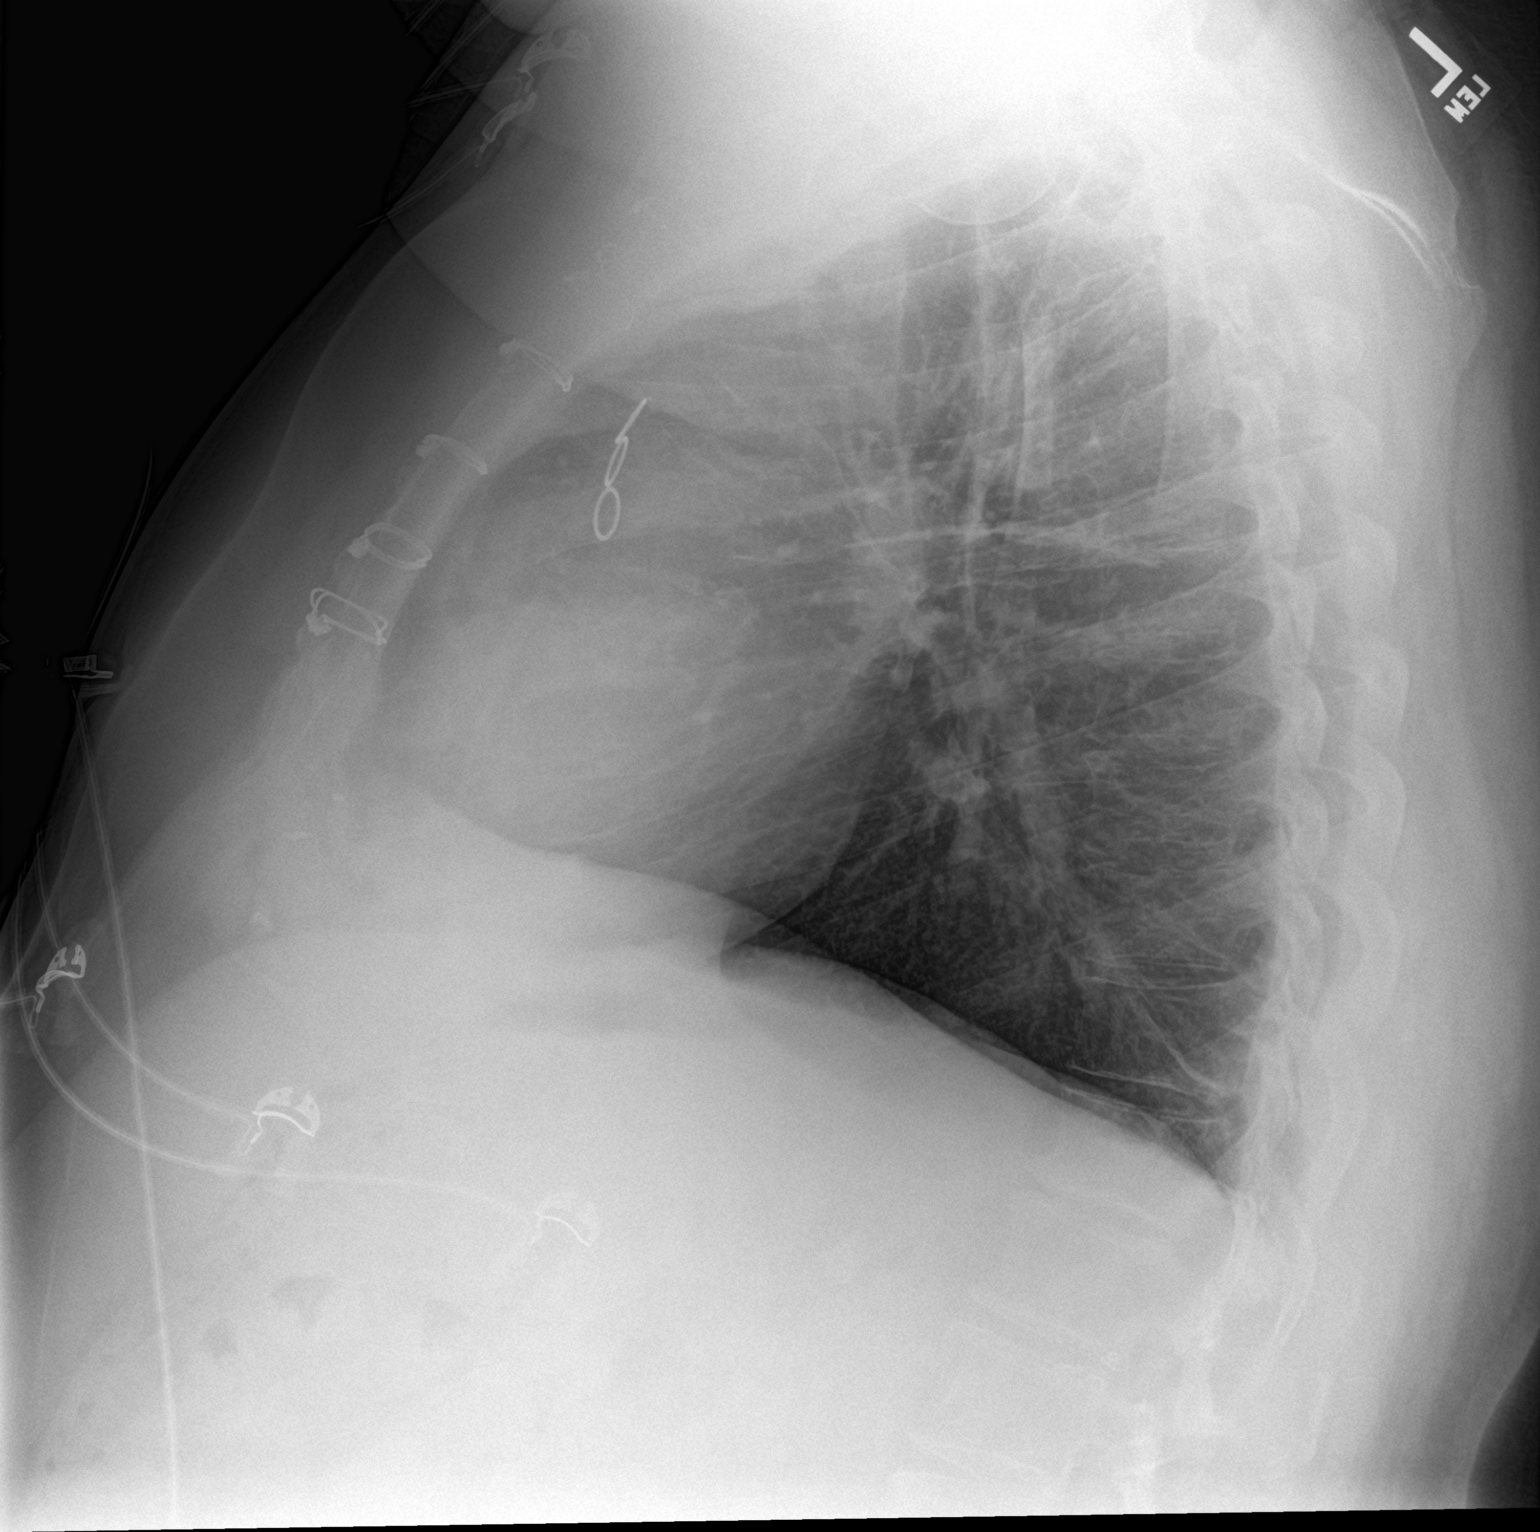

[2 of 2 positions shown; findings below may reference images not displayed]

FINDINGS: The heart size and mediastinal contours are within normal limits.
There is aortic atherosclerosis involving the arch. Median
sternotomy sutures are noted. Both lungs are clear. Trace edema
along the minor fissure. Chronic scarring or atelectasis at the left
lung base. The visualized skeletal structures are unremarkable.
IMPRESSION: No active cardiopulmonary disease.  No significant change.

## 2018-04-16 DIAGNOSIS — I2510 Atherosclerotic heart disease of native coronary artery without angina pectoris: Secondary | ICD-10-CM | POA: Diagnosis not present

## 2018-04-16 DIAGNOSIS — I480 Paroxysmal atrial fibrillation: Secondary | ICD-10-CM | POA: Diagnosis not present

## 2018-04-16 DIAGNOSIS — I110 Hypertensive heart disease with heart failure: Secondary | ICD-10-CM | POA: Diagnosis not present

## 2018-04-16 DIAGNOSIS — E119 Type 2 diabetes mellitus without complications: Secondary | ICD-10-CM | POA: Diagnosis not present

## 2018-04-16 DIAGNOSIS — I501 Left ventricular failure: Secondary | ICD-10-CM | POA: Diagnosis not present

## 2018-04-16 DIAGNOSIS — F319 Bipolar disorder, unspecified: Secondary | ICD-10-CM | POA: Diagnosis not present

## 2018-04-16 DIAGNOSIS — J9611 Chronic respiratory failure with hypoxia: Secondary | ICD-10-CM | POA: Diagnosis not present

## 2018-04-16 DIAGNOSIS — J449 Chronic obstructive pulmonary disease, unspecified: Secondary | ICD-10-CM | POA: Diagnosis not present

## 2018-04-16 DIAGNOSIS — F419 Anxiety disorder, unspecified: Secondary | ICD-10-CM | POA: Diagnosis not present

## 2018-04-20 DIAGNOSIS — J9611 Chronic respiratory failure with hypoxia: Secondary | ICD-10-CM | POA: Diagnosis not present

## 2018-04-20 DIAGNOSIS — I110 Hypertensive heart disease with heart failure: Secondary | ICD-10-CM | POA: Diagnosis not present

## 2018-04-20 DIAGNOSIS — I480 Paroxysmal atrial fibrillation: Secondary | ICD-10-CM | POA: Diagnosis not present

## 2018-04-20 DIAGNOSIS — J449 Chronic obstructive pulmonary disease, unspecified: Secondary | ICD-10-CM | POA: Diagnosis not present

## 2018-04-20 DIAGNOSIS — F419 Anxiety disorder, unspecified: Secondary | ICD-10-CM | POA: Diagnosis not present

## 2018-04-20 DIAGNOSIS — E119 Type 2 diabetes mellitus without complications: Secondary | ICD-10-CM | POA: Diagnosis not present

## 2018-04-20 DIAGNOSIS — I2510 Atherosclerotic heart disease of native coronary artery without angina pectoris: Secondary | ICD-10-CM | POA: Diagnosis not present

## 2018-04-20 DIAGNOSIS — I501 Left ventricular failure: Secondary | ICD-10-CM | POA: Diagnosis not present

## 2018-04-20 DIAGNOSIS — F319 Bipolar disorder, unspecified: Secondary | ICD-10-CM | POA: Diagnosis not present

## 2018-04-21 DIAGNOSIS — I110 Hypertensive heart disease with heart failure: Secondary | ICD-10-CM | POA: Diagnosis not present

## 2018-04-21 DIAGNOSIS — J449 Chronic obstructive pulmonary disease, unspecified: Secondary | ICD-10-CM | POA: Diagnosis not present

## 2018-04-21 DIAGNOSIS — J9611 Chronic respiratory failure with hypoxia: Secondary | ICD-10-CM | POA: Diagnosis not present

## 2018-04-21 DIAGNOSIS — I501 Left ventricular failure: Secondary | ICD-10-CM | POA: Diagnosis not present

## 2018-04-21 DIAGNOSIS — I2510 Atherosclerotic heart disease of native coronary artery without angina pectoris: Secondary | ICD-10-CM | POA: Diagnosis not present

## 2018-04-21 DIAGNOSIS — F419 Anxiety disorder, unspecified: Secondary | ICD-10-CM | POA: Diagnosis not present

## 2018-04-21 DIAGNOSIS — E119 Type 2 diabetes mellitus without complications: Secondary | ICD-10-CM | POA: Diagnosis not present

## 2018-04-21 DIAGNOSIS — I480 Paroxysmal atrial fibrillation: Secondary | ICD-10-CM | POA: Diagnosis not present

## 2018-04-21 DIAGNOSIS — F319 Bipolar disorder, unspecified: Secondary | ICD-10-CM | POA: Diagnosis not present

## 2018-04-22 DIAGNOSIS — J449 Chronic obstructive pulmonary disease, unspecified: Secondary | ICD-10-CM | POA: Diagnosis not present

## 2018-04-22 DIAGNOSIS — I501 Left ventricular failure: Secondary | ICD-10-CM | POA: Diagnosis not present

## 2018-04-22 DIAGNOSIS — I2510 Atherosclerotic heart disease of native coronary artery without angina pectoris: Secondary | ICD-10-CM | POA: Diagnosis not present

## 2018-04-22 DIAGNOSIS — F319 Bipolar disorder, unspecified: Secondary | ICD-10-CM | POA: Diagnosis not present

## 2018-04-22 DIAGNOSIS — F419 Anxiety disorder, unspecified: Secondary | ICD-10-CM | POA: Diagnosis not present

## 2018-04-22 DIAGNOSIS — I480 Paroxysmal atrial fibrillation: Secondary | ICD-10-CM | POA: Diagnosis not present

## 2018-04-22 DIAGNOSIS — E119 Type 2 diabetes mellitus without complications: Secondary | ICD-10-CM | POA: Diagnosis not present

## 2018-04-22 DIAGNOSIS — I110 Hypertensive heart disease with heart failure: Secondary | ICD-10-CM | POA: Diagnosis not present

## 2018-04-22 DIAGNOSIS — J9611 Chronic respiratory failure with hypoxia: Secondary | ICD-10-CM | POA: Diagnosis not present

## 2018-04-23 DIAGNOSIS — I501 Left ventricular failure: Secondary | ICD-10-CM | POA: Diagnosis not present

## 2018-04-23 DIAGNOSIS — I480 Paroxysmal atrial fibrillation: Secondary | ICD-10-CM | POA: Diagnosis not present

## 2018-04-23 DIAGNOSIS — E119 Type 2 diabetes mellitus without complications: Secondary | ICD-10-CM | POA: Diagnosis not present

## 2018-04-23 DIAGNOSIS — J9611 Chronic respiratory failure with hypoxia: Secondary | ICD-10-CM | POA: Diagnosis not present

## 2018-04-23 DIAGNOSIS — I110 Hypertensive heart disease with heart failure: Secondary | ICD-10-CM | POA: Diagnosis not present

## 2018-04-23 DIAGNOSIS — F419 Anxiety disorder, unspecified: Secondary | ICD-10-CM | POA: Diagnosis not present

## 2018-04-23 DIAGNOSIS — F319 Bipolar disorder, unspecified: Secondary | ICD-10-CM | POA: Diagnosis not present

## 2018-04-23 DIAGNOSIS — I2510 Atherosclerotic heart disease of native coronary artery without angina pectoris: Secondary | ICD-10-CM | POA: Diagnosis not present

## 2018-04-23 DIAGNOSIS — J449 Chronic obstructive pulmonary disease, unspecified: Secondary | ICD-10-CM | POA: Diagnosis not present

## 2018-04-26 DIAGNOSIS — J9611 Chronic respiratory failure with hypoxia: Secondary | ICD-10-CM | POA: Diagnosis not present

## 2018-04-26 DIAGNOSIS — I2510 Atherosclerotic heart disease of native coronary artery without angina pectoris: Secondary | ICD-10-CM | POA: Diagnosis not present

## 2018-04-26 DIAGNOSIS — L97512 Non-pressure chronic ulcer of other part of right foot with fat layer exposed: Secondary | ICD-10-CM | POA: Diagnosis not present

## 2018-04-26 DIAGNOSIS — E1140 Type 2 diabetes mellitus with diabetic neuropathy, unspecified: Secondary | ICD-10-CM | POA: Diagnosis not present

## 2018-04-26 DIAGNOSIS — I110 Hypertensive heart disease with heart failure: Secondary | ICD-10-CM | POA: Diagnosis not present

## 2018-04-26 DIAGNOSIS — Z794 Long term (current) use of insulin: Secondary | ICD-10-CM | POA: Diagnosis not present

## 2018-04-26 DIAGNOSIS — F419 Anxiety disorder, unspecified: Secondary | ICD-10-CM | POA: Diagnosis not present

## 2018-04-26 DIAGNOSIS — E119 Type 2 diabetes mellitus without complications: Secondary | ICD-10-CM | POA: Diagnosis not present

## 2018-04-26 DIAGNOSIS — I501 Left ventricular failure: Secondary | ICD-10-CM | POA: Diagnosis not present

## 2018-04-26 DIAGNOSIS — B351 Tinea unguium: Secondary | ICD-10-CM | POA: Diagnosis not present

## 2018-04-26 DIAGNOSIS — F319 Bipolar disorder, unspecified: Secondary | ICD-10-CM | POA: Diagnosis not present

## 2018-04-26 DIAGNOSIS — J449 Chronic obstructive pulmonary disease, unspecified: Secondary | ICD-10-CM | POA: Diagnosis not present

## 2018-04-26 DIAGNOSIS — L03032 Cellulitis of left toe: Secondary | ICD-10-CM | POA: Diagnosis not present

## 2018-04-26 DIAGNOSIS — I480 Paroxysmal atrial fibrillation: Secondary | ICD-10-CM | POA: Diagnosis not present

## 2018-04-28 ENCOUNTER — Encounter: Payer: Self-pay | Admitting: Emergency Medicine

## 2018-04-28 ENCOUNTER — Emergency Department: Payer: Medicare HMO

## 2018-04-28 ENCOUNTER — Emergency Department
Admission: EM | Admit: 2018-04-28 | Discharge: 2018-04-28 | Disposition: A | Discharge status: Home / Self Care | Payer: Medicare HMO | Attending: Emergency Medicine | Admitting: Emergency Medicine

## 2018-04-28 ENCOUNTER — Other Ambulatory Visit: Payer: Self-pay

## 2018-04-28 DIAGNOSIS — E119 Type 2 diabetes mellitus without complications: Secondary | ICD-10-CM | POA: Diagnosis not present

## 2018-04-28 DIAGNOSIS — S2232XA Fracture of one rib, left side, initial encounter for closed fracture: Secondary | ICD-10-CM | POA: Insufficient documentation

## 2018-04-28 DIAGNOSIS — Y929 Unspecified place or not applicable: Secondary | ICD-10-CM | POA: Diagnosis not present

## 2018-04-28 DIAGNOSIS — I110 Hypertensive heart disease with heart failure: Secondary | ICD-10-CM | POA: Insufficient documentation

## 2018-04-28 DIAGNOSIS — Y939 Activity, unspecified: Secondary | ICD-10-CM | POA: Insufficient documentation

## 2018-04-28 DIAGNOSIS — Z794 Long term (current) use of insulin: Secondary | ICD-10-CM | POA: Insufficient documentation

## 2018-04-28 DIAGNOSIS — Z79899 Other long term (current) drug therapy: Secondary | ICD-10-CM | POA: Insufficient documentation

## 2018-04-28 DIAGNOSIS — I509 Heart failure, unspecified: Secondary | ICD-10-CM

## 2018-04-28 DIAGNOSIS — E1159 Type 2 diabetes mellitus with other circulatory complications: Secondary | ICD-10-CM | POA: Diagnosis not present

## 2018-04-28 DIAGNOSIS — I480 Paroxysmal atrial fibrillation: Secondary | ICD-10-CM | POA: Diagnosis not present

## 2018-04-28 DIAGNOSIS — I2510 Atherosclerotic heart disease of native coronary artery without angina pectoris: Secondary | ICD-10-CM | POA: Diagnosis not present

## 2018-04-28 DIAGNOSIS — F419 Anxiety disorder, unspecified: Secondary | ICD-10-CM | POA: Diagnosis not present

## 2018-04-28 DIAGNOSIS — J9611 Chronic respiratory failure with hypoxia: Secondary | ICD-10-CM | POA: Diagnosis not present

## 2018-04-28 DIAGNOSIS — J90 Pleural effusion, not elsewhere classified: Secondary | ICD-10-CM | POA: Diagnosis not present

## 2018-04-28 DIAGNOSIS — J449 Chronic obstructive pulmonary disease, unspecified: Secondary | ICD-10-CM | POA: Insufficient documentation

## 2018-04-28 DIAGNOSIS — I501 Left ventricular failure: Secondary | ICD-10-CM | POA: Diagnosis not present

## 2018-04-28 DIAGNOSIS — W01198A Fall on same level from slipping, tripping and stumbling with subsequent striking against other object, initial encounter: Secondary | ICD-10-CM | POA: Insufficient documentation

## 2018-04-28 DIAGNOSIS — I5033 Acute on chronic diastolic (congestive) heart failure: Secondary | ICD-10-CM | POA: Diagnosis not present

## 2018-04-28 DIAGNOSIS — R0602 Shortness of breath: Secondary | ICD-10-CM | POA: Diagnosis present

## 2018-04-28 DIAGNOSIS — S2242XA Multiple fractures of ribs, left side, initial encounter for closed fracture: Secondary | ICD-10-CM | POA: Diagnosis not present

## 2018-04-28 DIAGNOSIS — R918 Other nonspecific abnormal finding of lung field: Secondary | ICD-10-CM | POA: Diagnosis not present

## 2018-04-28 DIAGNOSIS — W19XXXA Unspecified fall, initial encounter: Secondary | ICD-10-CM

## 2018-04-28 DIAGNOSIS — Z7901 Long term (current) use of anticoagulants: Secondary | ICD-10-CM | POA: Diagnosis not present

## 2018-04-28 DIAGNOSIS — F319 Bipolar disorder, unspecified: Secondary | ICD-10-CM | POA: Diagnosis not present

## 2018-04-28 DIAGNOSIS — Y999 Unspecified external cause status: Secondary | ICD-10-CM | POA: Diagnosis not present

## 2018-04-28 LAB — BASIC METABOLIC PANEL
Anion gap: 11 (ref 5–15)
BUN: 21 mg/dL (ref 8–23)
CHLORIDE: 103 mmol/L (ref 98–111)
CO2: 25 mmol/L (ref 22–32)
CREATININE: 0.89 mg/dL (ref 0.61–1.24)
Calcium: 8.4 mg/dL — ABNORMAL LOW (ref 8.9–10.3)
GFR calc non Af Amer: >60 mL/min (ref >60)
GLUCOSE: 128 mg/dL — AB (ref 70–99)
Potassium: 4.4 mmol/L (ref 3.5–5.1)
Sodium: 139 mmol/L (ref 135–145)

## 2018-04-28 LAB — CBC
HEMATOCRIT: 32.5 % — AB (ref 39.0–52.0)
Hemoglobin: 9.6 g/dL — ABNORMAL LOW (ref 13.0–17.0)
MCH: 23.8 pg — AB (ref 26.0–34.0)
MCHC: 29.5 g/dL — ABNORMAL LOW (ref 30.0–36.0)
MCV: 80.4 fL (ref 80.0–100.0)
NRBC: 0 % (ref 0.0–0.2)
Platelets: 235 10*3/uL (ref 150–400)
RBC: 4.04 MIL/uL — ABNORMAL LOW (ref 4.22–5.81)
RDW: 17.1 % — AB (ref 11.5–15.5)
WBC: 7 10*3/uL (ref 4.0–10.5)

## 2018-04-28 LAB — TROPONIN I
Troponin I: 0.05 ng/mL (ref <0.03)
Troponin I: 0.05 ng/mL (ref <0.03)

## 2018-04-28 LAB — BRAIN NATRIURETIC PEPTIDE: B NATRIURETIC PEPTIDE 5: 1700 pg/mL — AB (ref 0.0–100.0)

## 2018-04-28 MED ORDER — ACETAMINOPHEN 500 MG PO TABS
1000.0000 mg | ORAL_TABLET | Freq: Once | ORAL | Status: AC
  Administered 2018-04-28: 1000 mg via ORAL
  Filled 2018-04-28: qty 2

## 2018-04-28 MED ORDER — FUROSEMIDE 10 MG/ML IJ SOLN
40.0000 mg | Freq: Once | INTRAMUSCULAR | Status: AC
Start: 2018-04-28 — End: 2018-04-28
  Administered 2018-04-28: 40 mg via INTRAVENOUS
  Filled 2018-04-28: qty 4

## 2018-04-28 NOTE — ED Triage Notes (Signed)
Pt in via POV reports mechanical fall last night, landing of left side, complaints of increased shortness of breath since the fall.  Pt denies any chest pain at this time.  NAD noted.

## 2018-04-28 NOTE — ED Notes (Signed)
Pt ambulatory to toilet

## 2018-04-28 NOTE — ED Provider Notes (Signed)
[facility] Emergency Department Provider Note  ____________________________________________  Time seen: Approximately 1:37 PM  I have reviewed the triage vital signs and the nursing notes.   HISTORY  Chief Complaint Shortness of Breath   HPI Marcus Williams is a 62 y.o. male with history of A. fib on Eliquis, CAD, CHF, diabetes who presents for evaluation of shortness of breath. Patient reports thathe was in his usual state of health until yesterday afternoon when he tripped and fell from standing. He reports that he hit the left side of his chest onto the floor. Yesterday evening he started having shortness of breath. He reports sharp pain when he takes a deep breath that is located in the left side of his chest at the site of the collision with the floor. He denies having any pain before the fall. He reports that this morning the shortness of breath intensified. He denies any changes to his chronic cough, fever or chills, nausea or vomiting. He reports that his pain is very minimal unless he coughs or takes a very deep breath.he denies head trauma or LOC, neck pain or back pain, extremity pain or abdominal pain.  Past Medical History:  Diagnosis Date  . A-fib ([place])   . Aortic stenosis, moderate 11/2015  . Arthritis   . CAD (coronary artery disease)   . Chronic systolic CHF (congestive heart failure) ([place])   . Diabetes mellitus without complication ([place])    INSULIN DEPENDENT  . GERD (gastroesophageal reflux disease)   . History of cardioversion   . Hypertension   . MI (myocardial infarction) ([place])   . OSA on CPAP   . Sepsis ([place])   . Shortness of breath dyspnea   . Stroke ([place])   . SVT (supraventricular tachycardia) [facility])     Patient Active Problem List   Diagnosis Date Noted  . Unstable angina ([place]) 12/03/2017  . Elevated troponin 11/29/2017  . Pneumonia 08/10/2017  . NSTEMI (non-ST elevated myocardial infarction) ([place]) 07/28/2017  . TIA  (transient ischemic attack) 03/01/2017  . Aortic stenosis 11/21/2015  . Acute on chronic diastolic CHF (congestive heart failure), NYHA class 3 ([place]) 11/21/2015  . Aortic valve, bicuspid 09/24/2015  . Cellulitis 05/04/2015  . Acute respiratory failure with hypoxia ([place]) 03/26/2015  . CHF (congestive heart failure) ([place]) 01/15/2015  . HTN (hypertension) 01/14/2015  . Type II diabetes mellitus ([place]) 01/14/2015  . COPD (chronic obstructive pulmonary disease) ([place]) 01/14/2015  . OSA on CPAP 01/14/2015  . CAD (coronary artery disease) 01/14/2015  . A-fib ([place]) 01/14/2015  . Pain of left calf 01/14/2015  . Acute on chronic systolic CHF (congestive heart failure) ([place]) 01/14/2015  . COPD exacerbation ([place]) 12/22/2014  . Diabetes ([place]) 10/14/2014    Past Surgical History:  Procedure Laterality Date  . 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 BUNIONECTOMY    . CARDIAC CATHETERIZATION N/A 10/02/2015   Procedure: Coronary/Grafts Angiography;  Surgeon: Teodoro Spray, MD;  Location: [place] CV LAB;  Service: Cardiovascular;  Laterality: N/A;  . CARDIAC CATHETERIZATION N/A 11/21/2015   Procedure: Right and Left Heart Cath;  Surgeon: Sherren Mocha, MD;  Location: [place] [facility];  Service: Cardiovascular;  Laterality: N/A;  . CORONARY ARTERY BYPASS GRAFT    . CORONARY STENT INTERVENTION N/A 07/30/2017   Procedure: CORONARY STENT INTERVENTION;  Surgeon: Isaias Cowman, MD;  Location: [place] [facility]  [facility];  Service: Cardiovascular;  Laterality: N/A;  . CORONARY STENT INTERVENTION N/A 10/23/2017   Procedure: CORONARY STENT INTERVENTION;  Surgeon: Yolonda Kida, MD;  Location: [place] [facility];  Service: Cardiovascular;  Laterality: N/A;  . KNEE SURGERY    . LEFT HEART CATH AND CORONARY ANGIOGRAPHY N/A 10/30/2017   Procedure: LEFT HEART CATH AND CORONARY ANGIOGRAPHY;   Surgeon: Teodoro Spray, MD;  Location: [place] [facility];  Service: Cardiovascular;  Laterality: N/A;  . LEFT HEART CATH AND CORS/GRAFTS ANGIOGRAPHY N/A 10/22/2017   Procedure: LEFT HEART CATH AND CORS/GRAFTS ANGIOGRAPHY;  Surgeon: Corey Skains, MD;  Location: [place] [facility];  Service: Cardiovascular;  Laterality: N/A;  . quadruple bypass    . RIGHT/LEFT HEART CATH AND CORONARY/GRAFT ANGIOGRAPHY N/A 07/30/2017   Procedure: RIGHT/LEFT HEART CATH AND CORONARY/GRAFT ANGIOGRAPHY;  Surgeon: Isaias Cowman, MD;  Location: [place] [facility] LAB;  Service: Cardiovascular;  Laterality: N/A;  . TEE WITHOUT CARDIOVERSION  11/21/2015  . TEE WITHOUT CARDIOVERSION N/A 11/21/2015   Procedure: TRANSESOPHAGEAL ECHOCARDIOGRAM (TEE);  Surgeon: Larey Dresser, MD;  Location: [place];  Service: Cardiovascular;  Laterality: N/A;    Prior to Admission medications   Medication Sig Start Date End Date Taking? Authorizing Provider  albuterol (PROVENTIL HFA;VENTOLIN HFA) 108 (90 Base) MCG/ACT inhaler Inhale 2 puffs into the lungs every 6 (six) hours as needed for wheezing or shortness of breath. 07/11/15   Flora Lipps, MD  apixaban (ELIQUIS) 5 MG TABS tablet Take 5 mg by mouth 2 (two) times daily.    [provider]  atorvastatin (LIPITOR) 40 MG tablet Take 1 tablet (40 mg total) by mouth daily at 6 PM. 10/24/17   Gouru, Aruna, MD  baclofen (LIORESAL) 10 MG tablet Take 10 mg by mouth 2 (two) times daily as needed for muscle spasms.     [provider]  clopidogrel (PLAVIX) 75 MG tablet Take 1 tablet (75 mg total) by mouth daily with breakfast. 08/01/17   Max Sane, MD  diltiazem (CARDIZEM CD) 180 MG 24 hr capsule Take 180 mg by mouth 2 (two) times daily.    [provider]  DULoxetine (CYMBALTA) 20 MG capsule Take 20 mg by mouth daily.    [provider]  Fluticasone-Salmeterol (ADVAIR) 250-50 MCG/DOSE AEPB Inhale 1 puff into the lungs 2 (two) times daily. 11/30/17    Gladstone Lighter, MD  insulin NPH-regular Human (NOVOLIN 70/30) (70-30) 100 UNIT/ML injection Inject 85-100 Units into the skin 2 (two) times daily with a meal. 85 units in the morning and 100 units in the evening    [provider]  isosorbide mononitrate (IMDUR) 30 MG 24 hr tablet Take 1 tablet (30 mg total) by mouth daily. 12/06/17   Gladstone Lighter, MD  lisinopril (PRINIVIL,ZESTRIL) 10 MG tablet Take 1 tablet (10 mg total) by mouth daily. 08/14/17   Gladstone Lighter, MD  metFORMIN (GLUCOPHAGE) 1000 MG tablet Take 1,000 mg by mouth 2 (two) times daily with a meal.    [provider]  metoprolol (LOPRESSOR) 100 MG tablet Take 100 mg by mouth 2 (two) times daily. 10/15/15   [provider]  nitroGLYCERIN (NITROSTAT) 0.4 MG SL tablet Place 0.4 mg under the tongue every 5 (five) minutes as needed for chest pain.    [provider]  pantoprazole (PROTONIX) 40 MG tablet Take 1 tablet (40 mg total) by mouth 2 (two) times daily. 12/05/17   Gladstone Lighter, MD  potassium chloride 20 MEQ TBCR Take 20  mEq by mouth daily. 11/23/15   Shirley Friar, PA-C  pramipexole (MIRAPEX) 0.5 MG tablet Take 0.5 mg by mouth 3 (three) times daily as needed.     [provider]  torsemide (DEMADEX) 20 MG tablet Take 2 tablets (40 mg total) by mouth daily. 08/14/17   Gladstone Lighter, MD  zolpidem (AMBIEN) 5 MG tablet Take 1 tablet (5 mg total) by mouth at bedtime as needed for sleep. 11/01/17   Fritzi Mandes, MD    Allergies Contrast media [iodinated diagnostic agents] and Versed [midazolam]  Family History  Problem Relation Age of Onset  . Stroke Mother   . Heart attack Mother   . Hypertension Mother   . Heart attack Father   . Hypertension Father   . Heart attack Brother        #1  . Diabetes Brother        #1  . Heart disease Brother        #2  . Lung disease Brother        #2  . Hypertension Brother        #2  . Diabetes Brother        #2     Social History Social History   Tobacco Use  . Smoking status: Never Smoker  . Smokeless tobacco: Never Used  Substance Use Topics  . Alcohol use: No  . Drug use: No    Review of Systems  Constitutional: Negative for fever. Eyes: Negative for visual changes. ENT: Negative for sore throat. Neck: No neck pain  Cardiovascular: + L sided chest pain. Respiratory: + shortness of breath. Gastrointestinal: Negative for abdominal pain, vomiting or diarrhea. Genitourinary: Negative for dysuria. Musculoskeletal: Negative for back pain. Skin: Negative for rash. Neurological: Negative for headaches, weakness or numbness. Psych: No SI or HI  ____________________________________________   PHYSICAL EXAM:  VITAL SIGNS: ED Triage Vitals  Enc Vitals Group     BP 04/28/18 1219 121/72     Pulse Rate 04/28/18 1219 99     Resp 04/28/18 1219 17     Temp 04/28/18 1219 97.6 F (36.4 C)     Temp Source 04/28/18 1219 Oral     SpO2 04/28/18 1219 98 %     Weight 04/28/18 1214 252 lb (114.3 kg)     Height 04/28/18 1214 5\' 7"  (1.702 m)     Head Circumference --      Peak Flow --      Pain Score 04/28/18 1214 7     Pain Loc --      Pain Edu? --      Excl. in [place]? --     Constitutional: Alert and oriented, obese, no distress.  HEENT:      Head: Normocephalic and atraumatic.         Eyes: Conjunctivae are normal. Sclera is non-icteric.       Mouth/Throat: Mucous membranes are moist.       Neck: Supple with no signs of meningismus. Cardiovascular: Regular rate and rhythm. No murmurs, gallops, or rubs. 2+ symmetrical distal pulses are present in all extremities. No JVD. Respiratory: Normal respiratory effort, normal sats. Lungs are clear to auscultation bilaterally slightly reduced breath sounds at the L base. No wheezes, crackles, or rhonchi.  Gastrointestinal: Soft, non tender, and non distended with positive bowel sounds. No rebound or guarding. Musculoskeletal: Mild tenderness to  palpation over the anterior and lateral L lower ribs with no deformities or bruises. Nontender with normal range  of motion in all extremities. No edema, cyanosis, or erythema of extremities.no CT and L-spine tenderness Neurologic: Normal speech and language. Face is symmetric. Moving all extremities. No gross focal neurologic deficits are appreciated. Skin: Skin is warm, dry and intact. No rash noted. Psychiatric: Mood and affect are normal. Speech and behavior are normal.  ____________________________________________   LABS (all labs ordered are listed, but only abnormal results are displayed)  Labs Reviewed  CBC - Abnormal; Notable for the following components:      Result Value   RBC 4.04 (*)    Hemoglobin 9.6 (*)    HCT 32.5 (*)    MCH 23.8 (*)    MCHC 29.5 (*)    RDW 17.1 (*)    All other components within normal limits  TROPONIN I - Abnormal; Notable for the following components:   Troponin I 0.05 (*)    All other components within normal limits  BASIC METABOLIC PANEL - Abnormal; Notable for the following components:   Glucose, Bld 128 (*)    Calcium 8.4 (*)    All other components within normal limits  TROPONIN I - Abnormal; Notable for the following components:   Troponin I 0.05 (*)    All other components within normal limits  BRAIN NATRIURETIC PEPTIDE - Abnormal; Notable for the following components:   B Natriuretic Peptide 1,700.0 (*)    All other components within normal limits   ____________________________________________  EKG  ED ECG REPORT I, Rudene Re, the attending physician, personally viewed and interpreted this ECG.  Normal sinus rhythm, rate of 98, nonspecific intraventricular block, normal QTC, left axis deviation, T-wave inversions in lateral leads but no ST elevation. EKG is unchanged from prior.  ____________________________________________  RADIOLOGY  I have personally reviewed the images performed during this visit and I agree with the  Radiologist's read.   Interpretation by Radiologist:  Dg Chest 2 View  Result Date: 04/28/2018 CLINICAL DATA:  Shortness of breath. EXAM: CHEST - 2 VIEW COMPARISON:  Radiograph of December 03, 2017. FINDINGS: Stable cardiomegaly. Status post coronary artery bypass graft. No pneumothorax is noted. New left perihilar and basilar opacities are noted concerning for edema or infiltrate. Small left pleural effusion cannot be excluded. Right midlung subsegmental atelectasis or scarring is noted. Bony thorax is unremarkable. IMPRESSION: New left perihilar and basilar opacities are noted concerning for edema or infiltrate. Small left pleural effusion cannot be excluded. Electronically Signed   By: Marijo Conception, M.D.   On: 04/28/2018 13:13   Ct Chest Wo Contrast  Result Date: 04/28/2018 CLINICAL DATA:  Increasing shortness of breath since a fall left night onto his left side. Abnormal chest radiograph. EXAM: CT CHEST WITHOUT CONTRAST TECHNIQUE: Multidetector CT imaging of the chest was performed following the standard protocol without IV contrast. COMPARISON:  Chest x-rays dated 04/28/2018 and 12/03/2017 and chest CT 03/24/2017 FINDINGS: Cardiovascular: Cardiomegaly. Left ventricular apical-aortic conduit in place with the valve in the conduit. The conduit extends from the left ventricular apex to the descending thoracic aorta. Extensive coronary artery calcification. Extensive aortic valvular calcification. Aortic atherosclerosis. CABG. No pericardial effusion. Pulmonary vascularity appears normal. Mediastinum/Nodes: No enlarged mediastinal or axillary lymph nodes. Thyroid gland, trachea, and esophagus demonstrate no significant findings. Lungs/Pleura: There is a small amount of fluid loculated in the superior aspect of left major fissure. Chronic pleural thickening and slight atelectasis at the left lung base. No pneumothorax. Upper Abdomen: No acute abnormality. Musculoskeletal: There are multiple old left rib  fractures. There is  a subacute fracture of the posterolateral aspect of the left fifth rib. There are fractures of anterior costal cartilages of the left third, fourth, fifth and sixth ribs. There is extensive ill-defined soft tissue stranding in the subcutaneous fat of the lateral aspect of the left side of the chest. This may be due to the prior chest surgery but I cannot exclude cellulitis. No definable hematoma or abscess. This could also represent chest wall contusion from the patient's recent fall. IMPRESSION: 1. Soft tissue stranding in the subcutaneous fat of the left lateral chest wall which could represent soft tissue contusion or cellulitis or postsurgical changes from recent chest surgery. 2. Multiple old and subacute left rib fractures. 3. Fractures of the anterior costal cartilages of multiple left ribs. 4. Left ventricular apex to descending thoracic aorta conduit with valve. Electronically Signed   By: Lorriane Shire M.D.   On: 04/28/2018 14:40     ____________________________________________   PROCEDURES  Procedure(s) performed: None Procedures Critical Care performed:  None ____________________________________________   INITIAL IMPRESSION / ASSESSMENT AND PLAN / ED COURSE   62 y.o. male with history of A. fib on Eliquis, CAD, CHF, diabetes who presents for evaluation of shortness of breath and L sided chest pain that started after a fall from standing yesterday where patient hit his chest onto the floor. No obvious trauma on exam, patient does have diminished breath sounds on the left base. Chest x-ray showing possible bibasilar opacities. Clinically no signs of a pneumonia with no cough, no fever, and normal white count. Due to patient's body habitus chest x-ray is limited to eval for any evidence of rib fractures. therefore a CT of the chest has been ordered to evaluate for rib fractures versus pulmonary contusion versus pneumonia. EKG showing no acute ischemia. Labs showing  stable hemoglobin, normal white count, baseline troponin.  Clinical Course as of Apr 28 1602  Wed Apr 28, 2018  1600 CT showing new left the fifth rib fracture. Also showed a mild pulmonary edema especially on the left fissure. Patient was given a dose of IV Lasix for elevated BNP and findings of the CT. Patient reports that after he urinated several times and shortness of breath has resolved. He feels markedly improved. Patient has been using incentive spirometer. His pain is well controlled with Tylenol only. Recommended using the incentive spirometer several times a day and close follow-up with his primary care doctor in the next 2-3 days. Discussed return precautions for worsening shortness of breath or fever or chest pain. At this time patient is stable for discharge home.   [CV]    Clinical Course User Index [CV] Alfred Levins [place], MD     As part of my medical decision making, I reviewed the following data within the [facility] notes reviewed and incorporated, Labs reviewed , EKG interpreted , Old EKG reviewed, Old chart reviewed, Radiograph reviewed , Notes from prior ED visits and [place] Controlled Substance Database    Pertinent labs & imaging results that were available during my care of the patient were reviewed by me and considered in my medical decision making (see chart for details).    ____________________________________________   FINAL CLINICAL IMPRESSION(S) / ED DIAGNOSES  Final diagnoses:  Fall, initial encounter  Closed fracture of one rib of left side, initial encounter  Acute on chronic congestive heart failure, unspecified heart failure type ([place])      NEW MEDICATIONS STARTED DURING THIS VISIT:  ED Discharge Orders  None       Note:  This document was prepared using Dragon voice recognition software and may include unintentional dictation errors.    Rudene Re, MD 04/28/18 [phone]

## 2018-04-28 NOTE — Discharge Instructions (Addendum)
Take your medications as prescribed. use incentive spirometer several times a day. Take Tylenol 1000 mg 3 times a day for pain. Follow-up with your doctor or your cardiologist within the next 2-3 days. Return to the emergency room for new or worsening shortness of breath, chest pain, or fever.

## 2018-04-28 NOTE — ED Notes (Signed)
Sent rainbow to lab. Had to use chart labels printer is down. Called lab spoke to [place] about using chart labels.

## 2018-04-30 DIAGNOSIS — F319 Bipolar disorder, unspecified: Secondary | ICD-10-CM | POA: Diagnosis not present

## 2018-04-30 DIAGNOSIS — F419 Anxiety disorder, unspecified: Secondary | ICD-10-CM | POA: Diagnosis not present

## 2018-04-30 DIAGNOSIS — E119 Type 2 diabetes mellitus without complications: Secondary | ICD-10-CM | POA: Diagnosis not present

## 2018-04-30 DIAGNOSIS — I110 Hypertensive heart disease with heart failure: Secondary | ICD-10-CM | POA: Diagnosis not present

## 2018-04-30 DIAGNOSIS — J449 Chronic obstructive pulmonary disease, unspecified: Secondary | ICD-10-CM | POA: Diagnosis not present

## 2018-04-30 DIAGNOSIS — J9611 Chronic respiratory failure with hypoxia: Secondary | ICD-10-CM | POA: Diagnosis not present

## 2018-04-30 DIAGNOSIS — I480 Paroxysmal atrial fibrillation: Secondary | ICD-10-CM | POA: Diagnosis not present

## 2018-04-30 DIAGNOSIS — I2510 Atherosclerotic heart disease of native coronary artery without angina pectoris: Secondary | ICD-10-CM | POA: Diagnosis not present

## 2018-04-30 DIAGNOSIS — I501 Left ventricular failure: Secondary | ICD-10-CM | POA: Diagnosis not present

## 2018-05-01 DIAGNOSIS — T8131XA Disruption of external operation (surgical) wound, not elsewhere classified, initial encounter: Secondary | ICD-10-CM | POA: Diagnosis not present

## 2018-05-03 DIAGNOSIS — I2510 Atherosclerotic heart disease of native coronary artery without angina pectoris: Secondary | ICD-10-CM | POA: Diagnosis not present

## 2018-05-03 DIAGNOSIS — F419 Anxiety disorder, unspecified: Secondary | ICD-10-CM | POA: Diagnosis not present

## 2018-05-03 DIAGNOSIS — J9611 Chronic respiratory failure with hypoxia: Secondary | ICD-10-CM | POA: Diagnosis not present

## 2018-05-03 DIAGNOSIS — I480 Paroxysmal atrial fibrillation: Secondary | ICD-10-CM | POA: Diagnosis not present

## 2018-05-03 DIAGNOSIS — F319 Bipolar disorder, unspecified: Secondary | ICD-10-CM | POA: Diagnosis not present

## 2018-05-03 DIAGNOSIS — I501 Left ventricular failure: Secondary | ICD-10-CM | POA: Diagnosis not present

## 2018-05-03 DIAGNOSIS — E119 Type 2 diabetes mellitus without complications: Secondary | ICD-10-CM | POA: Diagnosis not present

## 2018-05-03 DIAGNOSIS — I110 Hypertensive heart disease with heart failure: Secondary | ICD-10-CM | POA: Diagnosis not present

## 2018-05-03 DIAGNOSIS — J449 Chronic obstructive pulmonary disease, unspecified: Secondary | ICD-10-CM | POA: Diagnosis not present

## 2018-05-04 DIAGNOSIS — I10 Essential (primary) hypertension: Secondary | ICD-10-CM | POA: Diagnosis not present

## 2018-05-04 DIAGNOSIS — I350 Nonrheumatic aortic (valve) stenosis: Secondary | ICD-10-CM | POA: Diagnosis not present

## 2018-05-04 DIAGNOSIS — I2510 Atherosclerotic heart disease of native coronary artery without angina pectoris: Secondary | ICD-10-CM | POA: Diagnosis not present

## 2018-05-05 DIAGNOSIS — I480 Paroxysmal atrial fibrillation: Secondary | ICD-10-CM | POA: Diagnosis not present

## 2018-05-05 DIAGNOSIS — J449 Chronic obstructive pulmonary disease, unspecified: Secondary | ICD-10-CM | POA: Diagnosis not present

## 2018-05-05 DIAGNOSIS — I110 Hypertensive heart disease with heart failure: Secondary | ICD-10-CM | POA: Diagnosis not present

## 2018-05-05 DIAGNOSIS — F419 Anxiety disorder, unspecified: Secondary | ICD-10-CM | POA: Diagnosis not present

## 2018-05-05 DIAGNOSIS — J9611 Chronic respiratory failure with hypoxia: Secondary | ICD-10-CM | POA: Diagnosis not present

## 2018-05-05 DIAGNOSIS — F319 Bipolar disorder, unspecified: Secondary | ICD-10-CM | POA: Diagnosis not present

## 2018-05-05 DIAGNOSIS — E119 Type 2 diabetes mellitus without complications: Secondary | ICD-10-CM | POA: Diagnosis not present

## 2018-05-05 DIAGNOSIS — I2510 Atherosclerotic heart disease of native coronary artery without angina pectoris: Secondary | ICD-10-CM | POA: Diagnosis not present

## 2018-05-05 DIAGNOSIS — I501 Left ventricular failure: Secondary | ICD-10-CM | POA: Diagnosis not present

## 2018-05-06 ENCOUNTER — Other Ambulatory Visit: Payer: Self-pay

## 2018-05-06 ENCOUNTER — Emergency Department: Payer: Medicare HMO

## 2018-05-06 ENCOUNTER — Emergency Department
Admission: EM | Admit: 2018-05-06 | Discharge: 2018-05-06 | Disposition: A | Discharge status: Home / Self Care | Payer: Medicare HMO | Attending: Emergency Medicine | Admitting: Emergency Medicine

## 2018-05-06 DIAGNOSIS — R52 Pain, unspecified: Secondary | ICD-10-CM | POA: Diagnosis not present

## 2018-05-06 DIAGNOSIS — I5022 Chronic systolic (congestive) heart failure: Secondary | ICD-10-CM | POA: Diagnosis not present

## 2018-05-06 DIAGNOSIS — J449 Chronic obstructive pulmonary disease, unspecified: Secondary | ICD-10-CM | POA: Insufficient documentation

## 2018-05-06 DIAGNOSIS — G4730 Sleep apnea, unspecified: Secondary | ICD-10-CM | POA: Diagnosis not present

## 2018-05-06 DIAGNOSIS — I2510 Atherosclerotic heart disease of native coronary artery without angina pectoris: Secondary | ICD-10-CM | POA: Insufficient documentation

## 2018-05-06 DIAGNOSIS — E119 Type 2 diabetes mellitus without complications: Secondary | ICD-10-CM | POA: Insufficient documentation

## 2018-05-06 DIAGNOSIS — R0600 Dyspnea, unspecified: Secondary | ICD-10-CM | POA: Diagnosis present

## 2018-05-06 DIAGNOSIS — Z794 Long term (current) use of insulin: Secondary | ICD-10-CM | POA: Diagnosis not present

## 2018-05-06 DIAGNOSIS — Z79899 Other long term (current) drug therapy: Secondary | ICD-10-CM | POA: Insufficient documentation

## 2018-05-06 DIAGNOSIS — Z7902 Long term (current) use of antithrombotics/antiplatelets: Secondary | ICD-10-CM | POA: Diagnosis not present

## 2018-05-06 DIAGNOSIS — Z7901 Long term (current) use of anticoagulants: Secondary | ICD-10-CM | POA: Insufficient documentation

## 2018-05-06 DIAGNOSIS — I4891 Unspecified atrial fibrillation: Secondary | ICD-10-CM | POA: Diagnosis not present

## 2018-05-06 DIAGNOSIS — I110 Hypertensive heart disease with heart failure: Secondary | ICD-10-CM | POA: Diagnosis not present

## 2018-05-06 DIAGNOSIS — I10 Essential (primary) hypertension: Secondary | ICD-10-CM | POA: Diagnosis not present

## 2018-05-06 DIAGNOSIS — J80 Acute respiratory distress syndrome: Secondary | ICD-10-CM | POA: Diagnosis not present

## 2018-05-06 DIAGNOSIS — R0902 Hypoxemia: Secondary | ICD-10-CM | POA: Diagnosis not present

## 2018-05-06 DIAGNOSIS — R0602 Shortness of breath: Secondary | ICD-10-CM | POA: Diagnosis not present

## 2018-05-06 MED ORDER — HYDROCOD POLST-CPM POLST ER 10-8 MG/5ML PO SUER
5.0000 mL | Freq: Once | ORAL | Status: AC
  Administered 2018-05-06: 5 mL via ORAL

## 2018-05-06 MED ORDER — HYDROCOD POLST-CPM POLST ER 10-8 MG/5ML PO SUER: 5.0000 mL | Freq: Two times a day (BID) | ORAL | 0 refills | Status: DC | PRN

## 2018-05-06 NOTE — ED Notes (Signed)
Pt dressed by family, stood with assistance and ambulated 3 steps to wheelchair. Pt taken to vehicle by this nurse and placed in seat with assistance.

## 2018-05-06 NOTE — ED Triage Notes (Signed)
Pt. Arrived via [facility].  Patient does not appear to be in respiratory distress.  Patient states he has sleep apnea and has panic attacks often.  Patient had a tracheostomy placed approximately 10 weeks ago at [facility].  Patient has not been able to use his CPAP due to the healing trach. Patient complains of minimal pain on inspiration.

## 2018-05-06 NOTE — ED Provider Notes (Signed)
[facility] Emergency Department Provider Note ___   First MD Initiated Contact with Patient 05/06/18 [id number]     (approximate)  I have reviewed the triage vital signs and the nursing notes.   HISTORY  Chief Complaint Respiratory Distress    HPI Marcus Williams is a 62 y.o. male with below list of chronic medical conditions including recent aortic valve replacement and recent fall with resultant rib fracture presents to the emergency department via EMS with no complaints at present.  Patient states however that he called EMS secondary to the fact that he awoke from sleep with difficulty breathing.  Patient states that he normally sleeps with a CPAP however since his tracheostomy 10 weeks ago at [facility] he has been on able to do so and has not been cleared by his ENT to resume CPAP use.  Patient states that as a result he has found that he awakes at night gasping for air.  Patient states that this causes him to have a panic attack which prompted his call tonight.  Patient denies any complaints before going to bed and no complaints at present other than a cough which patient states that he has had for approximately 1 week.   Past Medical History:  Diagnosis Date  . A-fib ([place])   . Aortic stenosis, moderate 11/2015  . Arthritis   . CAD (coronary artery disease)   . Chronic systolic CHF (congestive heart failure) ([place])   . Diabetes mellitus without complication ([place])    INSULIN DEPENDENT  . GERD (gastroesophageal reflux disease)   . History of cardioversion   . Hypertension   . MI (myocardial infarction) ([place])   . OSA on CPAP   . Sepsis ([place])   . Shortness of breath dyspnea   . Stroke ([place])   . SVT (supraventricular tachycardia) [facility])     Patient Active Problem List   Diagnosis Date Noted  . Unstable angina ([place]) 12/03/2017  . Elevated troponin 11/29/2017  . Pneumonia 08/10/2017  . NSTEMI (non-ST elevated myocardial infarction) ([place])  07/28/2017  . TIA (transient ischemic attack) 03/01/2017  . Aortic stenosis 11/21/2015  . Acute on chronic diastolic CHF (congestive heart failure), NYHA class 3 ([place]) 11/21/2015  . Aortic valve, bicuspid 09/24/2015  . Cellulitis 05/04/2015  . Acute respiratory failure with hypoxia ([place]) 03/26/2015  . CHF (congestive heart failure) ([place]) 01/15/2015  . HTN (hypertension) 01/14/2015  . Type II diabetes mellitus ([place]) 01/14/2015  . COPD (chronic obstructive pulmonary disease) ([place]) 01/14/2015  . OSA on CPAP 01/14/2015  . CAD (coronary artery disease) 01/14/2015  . A-fib ([place]) 01/14/2015  . Pain of left calf 01/14/2015  . Acute on chronic systolic CHF (congestive heart failure) ([place]) 01/14/2015  . COPD exacerbation ([place]) 12/22/2014  . Diabetes ([place]) 10/14/2014    Past Surgical History:  Procedure Laterality Date  . 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 BUNIONECTOMY    . CARDIAC CATHETERIZATION N/A 10/02/2015   Procedure: Coronary/Grafts Angiography;  Surgeon: Teodoro Spray, MD;  Location: [place] CV LAB;  Service: Cardiovascular;  Laterality: N/A;  . CARDIAC CATHETERIZATION N/A 11/21/2015   Procedure: Right and Left Heart Cath;  Surgeon: Sherren Mocha, MD;  Location: [place] [facility];  Service: Cardiovascular;  Laterality: N/A;  . CORONARY ARTERY BYPASS GRAFT    . CORONARY STENT INTERVENTION N/A 07/30/2017   Procedure: CORONARY  STENT INTERVENTION;  Surgeon: Isaias Cowman, MD;  Location: [place] [facility];  Service: Cardiovascular;  Laterality: N/A;  . CORONARY STENT INTERVENTION N/A 10/23/2017   Procedure: CORONARY STENT INTERVENTION;  Surgeon: Yolonda Kida, MD;  Location: [place] [facility];  Service: Cardiovascular;  Laterality: N/A;  . KNEE SURGERY    . LEFT HEART CATH AND CORONARY ANGIOGRAPHY N/A 10/30/2017   Procedure: LEFT HEART CATH AND CORONARY  ANGIOGRAPHY;  Surgeon: Teodoro Spray, MD;  Location: [place] [facility];  Service: Cardiovascular;  Laterality: N/A;  . LEFT HEART CATH AND CORS/GRAFTS ANGIOGRAPHY N/A 10/22/2017   Procedure: LEFT HEART CATH AND CORS/GRAFTS ANGIOGRAPHY;  Surgeon: Corey Skains, MD;  Location: [place] [facility];  Service: Cardiovascular;  Laterality: N/A;  . quadruple bypass    . RIGHT/LEFT HEART CATH AND CORONARY/GRAFT ANGIOGRAPHY N/A 07/30/2017   Procedure: RIGHT/LEFT HEART CATH AND CORONARY/GRAFT ANGIOGRAPHY;  Surgeon: Isaias Cowman, MD;  Location: [place] [facility];  Service: Cardiovascular;  Laterality: N/A;  . TEE WITHOUT CARDIOVERSION  11/21/2015  . TEE WITHOUT CARDIOVERSION N/A 11/21/2015   Procedure: TRANSESOPHAGEAL ECHOCARDIOGRAM (TEE);  Surgeon: Larey Dresser, MD;  Location: [place];  Service: Cardiovascular;  Laterality: N/A;  . TRACHEOSTOMY      Prior to Admission medications   Medication Sig Start Date End Date Taking? Authorizing Provider  albuterol (PROVENTIL HFA;VENTOLIN HFA) 108 (90 Base) MCG/ACT inhaler Inhale 2 puffs into the lungs every 6 (six) hours as needed for wheezing or shortness of breath. 07/11/15   Flora Lipps, MD  apixaban (ELIQUIS) 5 MG TABS tablet Take 5 mg by mouth 2 (two) times daily.    [provider]  atorvastatin (LIPITOR) 40 MG tablet Take 1 tablet (40 mg total) by mouth daily at 6 PM. 10/24/17   Gouru, Aruna, MD  baclofen (LIORESAL) 10 MG tablet Take 10 mg by mouth 2 (two) times daily as needed for muscle spasms.     [provider]  clopidogrel (PLAVIX) 75 MG tablet Take 1 tablet (75 mg total) by mouth daily with breakfast. 08/01/17   Max Sane, MD  diltiazem (CARDIZEM CD) 180 MG 24 hr capsule Take 180 mg by mouth 2 (two) times daily.    [provider]  DULoxetine (CYMBALTA) 20 MG capsule Take 20 mg by mouth daily.    [provider]  Fluticasone-Salmeterol (ADVAIR) 250-50 MCG/DOSE AEPB Inhale 1 puff into the  lungs 2 (two) times daily. 11/30/17   Gladstone Lighter, MD  insulin NPH-regular Human (NOVOLIN 70/30) (70-30) 100 UNIT/ML injection Inject 85-100 Units into the skin 2 (two) times daily with a meal. 85 units in the morning and 100 units in the evening    [provider]  isosorbide mononitrate (IMDUR) 30 MG 24 hr tablet Take 1 tablet (30 mg total) by mouth daily. 12/06/17   Gladstone Lighter, MD  lisinopril (PRINIVIL,ZESTRIL) 10 MG tablet Take 1 tablet (10 mg total) by mouth daily. 08/14/17   Gladstone Lighter, MD  metFORMIN (GLUCOPHAGE) 1000 MG tablet Take 1,000 mg by mouth 2 (two) times daily with a meal.    [provider]  metoprolol (LOPRESSOR) 100 MG tablet Take 100 mg by mouth 2 (two) times daily. 10/15/15   [provider]  nitroGLYCERIN (NITROSTAT) 0.4 MG SL tablet Place 0.4 mg under the tongue every 5 (five) minutes as needed for chest pain.    [provider]  pantoprazole (PROTONIX) 40 MG tablet Take 1 tablet (40 mg total) by mouth 2 (  two) times daily. 12/05/17   Gladstone Lighter, MD  potassium chloride 20 MEQ TBCR Take 20 mEq by mouth daily. 11/23/15   Shirley Friar, PA-C  pramipexole (MIRAPEX) 0.5 MG tablet Take 0.5 mg by mouth 3 (three) times daily as needed.     [provider]  torsemide (DEMADEX) 20 MG tablet Take 2 tablets (40 mg total) by mouth daily. 08/14/17   Gladstone Lighter, MD  zolpidem (AMBIEN) 5 MG tablet Take 1 tablet (5 mg total) by mouth at bedtime as needed for sleep. 11/01/17   Fritzi Mandes, MD    Allergies Contrast media [iodinated diagnostic agents] and Versed [midazolam]  Family History  Problem Relation Age of Onset  . Stroke Mother   . Heart attack Mother   . Hypertension Mother   . Heart attack Father   . Hypertension Father   . Heart attack Brother        #1  . Diabetes Brother        #1  . Heart disease Brother        #2  . Lung disease Brother        #2  . Hypertension Brother        #2  .  Diabetes Brother        #2    Social History Social History   Tobacco Use  . Smoking status: Never Smoker  . Smokeless tobacco: Never Used  Substance Use Topics  . Alcohol use: No  . Drug use: No    Review of Systems Constitutional: No fever/chills Eyes: No visual changes. ENT: No sore throat. Cardiovascular: Denies chest pain. Respiratory: Positive for dyspnea (now resolved)..  Positive for cough Gastrointestinal: No abdominal pain.  No nausea, no vomiting.  No diarrhea.  No constipation. Genitourinary: Negative for dysuria. Musculoskeletal: Negative for neck pain.  Negative for back pain. Integumentary: Negative for rash. Neurological: Negative for headaches, focal weakness or numbness.   ____________________________________________   PHYSICAL EXAM:  VITAL SIGNS: ED Triage Vitals  Enc Vitals Group     BP 05/06/18 0552 (!) 136/92     Pulse Rate 05/06/18 0552 79     Resp --      Temp 05/06/18 0552 98.4 F (36.9 C)     Temp Source 05/06/18 0552 Oral     SpO2 05/06/18 0551 98 %     Weight 05/06/18 0553 115.7 kg (255 lb)     Height 05/06/18 0553 1.702 m (5\' 7" )     Head Circumference --      Peak Flow --      Pain Score 05/06/18 0552 0     Pain Loc --      Pain Edu? --      Excl. in [place]? --     Constitutional: Alert and oriented. Well appearing and in no acute distress. Eyes: Conjunctivae are normal.  Mouth/Throat: Mucous membranes are moist.  Oropharynx non-erythematous. Neck: No stridor.  Tracheostomy site without signs of infection there is to be healing appropriately Cardiovascular: Normal rate, regular rhythm. Good peripheral circulation. Grossly normal heart sounds. Respiratory: Normal respiratory effort.  No retractions. Lungs CTAB. Gastrointestinal: Soft and nontender. No distention.  Musculoskeletal: No lower extremity tenderness nor edema. No gross deformities of extremities. Neurologic:  Normal speech and language. No gross focal neurologic deficits  are appreciated.  Skin:  Skin is warm, dry and intact. No rash noted. Psychiatric: Mood and affect are normal. Speech and behavior are normal.  __________________________________ RADIOLOGY I, [place]  [employer], personally viewed and evaluated these images (plain radiographs) as part of my medical decision making, as well as reviewing the written report by the radiologist.  ED MD interpretation: Cardiomegaly  Official radiology report(s): No results found.   Procedures   ____________________________________________   INITIAL IMPRESSION / ASSESSMENT AND PLAN / ED COURSE  As part of my medical decision making, I reviewed the following data within the electronic MEDICAL RECORD NUMBER  63 year old male presenting with above-stated history and physical exam secondary to dyspnea which resolved before arrival to the emergency department.  Based on the patient's history suspect symptoms to be secondary to sleep apnea and the patient's inability to use CPAP as prescribed due to recent tracheostomy.  Patient has an appointment scheduled with his ENT provider for medical clearance to return use of CPAP.  We will recommend that the patient follow-up with [place] ENT physician today. ____________________________________________  FINAL CLINICAL IMPRESSION(S) / ED DIAGNOSES  Final diagnoses:  Sleep apnea, unspecified type     MEDICATIONS GIVEN DURING THIS VISIT:  Medications  chlorpheniramine-HYDROcodone (TUSSIONEX) 10-8 MG/5ML suspension 5 mL (5 mLs Oral Given 05/06/18 0557)     ED Discharge Orders    None       Note:  This document was prepared using Dragon voice recognition software and may include unintentional dictation errors.    Gregor Hams, MD 05/06/18 [phone]

## 2018-05-07 DIAGNOSIS — I2510 Atherosclerotic heart disease of native coronary artery without angina pectoris: Secondary | ICD-10-CM | POA: Diagnosis not present

## 2018-05-07 DIAGNOSIS — I480 Paroxysmal atrial fibrillation: Secondary | ICD-10-CM | POA: Diagnosis not present

## 2018-05-07 DIAGNOSIS — I110 Hypertensive heart disease with heart failure: Secondary | ICD-10-CM | POA: Diagnosis not present

## 2018-05-07 DIAGNOSIS — I501 Left ventricular failure: Secondary | ICD-10-CM | POA: Diagnosis not present

## 2018-05-07 DIAGNOSIS — F419 Anxiety disorder, unspecified: Secondary | ICD-10-CM | POA: Diagnosis not present

## 2018-05-07 DIAGNOSIS — E119 Type 2 diabetes mellitus without complications: Secondary | ICD-10-CM | POA: Diagnosis not present

## 2018-05-07 DIAGNOSIS — J449 Chronic obstructive pulmonary disease, unspecified: Secondary | ICD-10-CM | POA: Diagnosis not present

## 2018-05-07 DIAGNOSIS — J9611 Chronic respiratory failure with hypoxia: Secondary | ICD-10-CM | POA: Diagnosis not present

## 2018-05-07 DIAGNOSIS — F319 Bipolar disorder, unspecified: Secondary | ICD-10-CM | POA: Diagnosis not present

## 2018-05-10 DIAGNOSIS — F419 Anxiety disorder, unspecified: Secondary | ICD-10-CM | POA: Diagnosis not present

## 2018-05-10 DIAGNOSIS — I501 Left ventricular failure: Secondary | ICD-10-CM | POA: Diagnosis not present

## 2018-05-10 DIAGNOSIS — E119 Type 2 diabetes mellitus without complications: Secondary | ICD-10-CM | POA: Diagnosis not present

## 2018-05-10 DIAGNOSIS — J449 Chronic obstructive pulmonary disease, unspecified: Secondary | ICD-10-CM | POA: Diagnosis not present

## 2018-05-10 DIAGNOSIS — I110 Hypertensive heart disease with heart failure: Secondary | ICD-10-CM | POA: Diagnosis not present

## 2018-05-10 DIAGNOSIS — I480 Paroxysmal atrial fibrillation: Secondary | ICD-10-CM | POA: Diagnosis not present

## 2018-05-10 DIAGNOSIS — I2510 Atherosclerotic heart disease of native coronary artery without angina pectoris: Secondary | ICD-10-CM | POA: Diagnosis not present

## 2018-05-10 DIAGNOSIS — F319 Bipolar disorder, unspecified: Secondary | ICD-10-CM | POA: Diagnosis not present

## 2018-05-10 DIAGNOSIS — J9611 Chronic respiratory failure with hypoxia: Secondary | ICD-10-CM | POA: Diagnosis not present

## 2018-05-11 DIAGNOSIS — T8141XD Infection following a procedure, superficial incisional surgical site, subsequent encounter: Secondary | ICD-10-CM | POA: Diagnosis not present

## 2018-05-11 DIAGNOSIS — Z7901 Long term (current) use of anticoagulants: Secondary | ICD-10-CM | POA: Diagnosis not present

## 2018-05-11 DIAGNOSIS — Z952 Presence of prosthetic heart valve: Secondary | ICD-10-CM | POA: Diagnosis not present

## 2018-05-12 DIAGNOSIS — E119 Type 2 diabetes mellitus without complications: Secondary | ICD-10-CM | POA: Diagnosis not present

## 2018-05-12 DIAGNOSIS — J449 Chronic obstructive pulmonary disease, unspecified: Secondary | ICD-10-CM | POA: Diagnosis not present

## 2018-05-12 DIAGNOSIS — J9611 Chronic respiratory failure with hypoxia: Secondary | ICD-10-CM | POA: Diagnosis not present

## 2018-05-12 DIAGNOSIS — I501 Left ventricular failure: Secondary | ICD-10-CM | POA: Diagnosis not present

## 2018-05-12 DIAGNOSIS — I2510 Atherosclerotic heart disease of native coronary artery without angina pectoris: Secondary | ICD-10-CM | POA: Diagnosis not present

## 2018-05-12 DIAGNOSIS — I110 Hypertensive heart disease with heart failure: Secondary | ICD-10-CM | POA: Diagnosis not present

## 2018-05-12 DIAGNOSIS — F419 Anxiety disorder, unspecified: Secondary | ICD-10-CM | POA: Diagnosis not present

## 2018-05-12 DIAGNOSIS — F319 Bipolar disorder, unspecified: Secondary | ICD-10-CM | POA: Diagnosis not present

## 2018-05-12 DIAGNOSIS — I480 Paroxysmal atrial fibrillation: Secondary | ICD-10-CM | POA: Diagnosis not present

## 2018-05-14 ENCOUNTER — Emergency Department
Admission: EM | Admit: 2018-05-14 | Discharge: 2018-05-14 | Disposition: A | Discharge status: Home / Self Care | Payer: Medicare HMO | Attending: Emergency Medicine | Admitting: Emergency Medicine

## 2018-05-14 DIAGNOSIS — R112 Nausea with vomiting, unspecified: Secondary | ICD-10-CM | POA: Insufficient documentation

## 2018-05-14 DIAGNOSIS — I252 Old myocardial infarction: Secondary | ICD-10-CM | POA: Diagnosis not present

## 2018-05-14 DIAGNOSIS — I5032 Chronic diastolic (congestive) heart failure: Secondary | ICD-10-CM | POA: Diagnosis not present

## 2018-05-14 DIAGNOSIS — Z79899 Other long term (current) drug therapy: Secondary | ICD-10-CM | POA: Insufficient documentation

## 2018-05-14 DIAGNOSIS — R1110 Vomiting, unspecified: Secondary | ICD-10-CM

## 2018-05-14 DIAGNOSIS — Z7902 Long term (current) use of antithrombotics/antiplatelets: Secondary | ICD-10-CM | POA: Insufficient documentation

## 2018-05-14 DIAGNOSIS — J449 Chronic obstructive pulmonary disease, unspecified: Secondary | ICD-10-CM | POA: Diagnosis not present

## 2018-05-14 DIAGNOSIS — I501 Left ventricular failure: Secondary | ICD-10-CM | POA: Diagnosis not present

## 2018-05-14 DIAGNOSIS — J9611 Chronic respiratory failure with hypoxia: Secondary | ICD-10-CM | POA: Diagnosis not present

## 2018-05-14 DIAGNOSIS — F319 Bipolar disorder, unspecified: Secondary | ICD-10-CM | POA: Diagnosis not present

## 2018-05-14 DIAGNOSIS — Z7901 Long term (current) use of anticoagulants: Secondary | ICD-10-CM | POA: Diagnosis not present

## 2018-05-14 DIAGNOSIS — F419 Anxiety disorder, unspecified: Secondary | ICD-10-CM | POA: Diagnosis not present

## 2018-05-14 DIAGNOSIS — I5022 Chronic systolic (congestive) heart failure: Secondary | ICD-10-CM | POA: Insufficient documentation

## 2018-05-14 DIAGNOSIS — I10 Essential (primary) hypertension: Secondary | ICD-10-CM | POA: Diagnosis not present

## 2018-05-14 DIAGNOSIS — I2510 Atherosclerotic heart disease of native coronary artery without angina pectoris: Secondary | ICD-10-CM | POA: Diagnosis not present

## 2018-05-14 DIAGNOSIS — I110 Hypertensive heart disease with heart failure: Secondary | ICD-10-CM | POA: Insufficient documentation

## 2018-05-14 DIAGNOSIS — E119 Type 2 diabetes mellitus without complications: Secondary | ICD-10-CM | POA: Diagnosis not present

## 2018-05-14 DIAGNOSIS — Z951 Presence of aortocoronary bypass graft: Secondary | ICD-10-CM | POA: Diagnosis not present

## 2018-05-14 DIAGNOSIS — Z794 Long term (current) use of insulin: Secondary | ICD-10-CM | POA: Diagnosis not present

## 2018-05-14 DIAGNOSIS — I480 Paroxysmal atrial fibrillation: Secondary | ICD-10-CM | POA: Diagnosis not present

## 2018-05-14 HISTORY — DX: Chronic obstructive pulmonary disease, unspecified: J44.9

## 2018-05-14 LAB — CBC WITH DIFFERENTIAL/PLATELET
Abs Immature Granulocytes: 0.06 10*3/uL (ref 0.00–0.07)
BASOS ABS: 0 10*3/uL (ref 0.0–0.1)
BASOS PCT: 0 %
EOS PCT: 4 %
Eosinophils Absolute: 0.5 10*3/uL (ref 0.0–0.5)
HEMATOCRIT: 34.7 % — AB (ref 39.0–52.0)
Hemoglobin: 10 g/dL — ABNORMAL LOW (ref 13.0–17.0)
IMMATURE GRANULOCYTES: 1 %
LYMPHS PCT: 5 %
Lymphs Abs: 0.6 10*3/uL — ABNORMAL LOW (ref 0.7–4.0)
MCH: 23.5 pg — ABNORMAL LOW (ref 26.0–34.0)
MCHC: 28.8 g/dL — AB (ref 30.0–36.0)
MCV: 81.5 fL (ref 80.0–100.0)
Monocytes Absolute: 0.7 10*3/uL (ref 0.1–1.0)
Monocytes Relative: 6 %
NEUTROS ABS: 10.1 10*3/uL — AB (ref 1.7–7.7)
NEUTROS PCT: 84 %
PLATELETS: 177 10*3/uL (ref 150–400)
RBC: 4.26 MIL/uL (ref 4.22–5.81)
RDW: 18.6 % — AB (ref 11.5–15.5)
WBC: 12 10*3/uL — ABNORMAL HIGH (ref 4.0–10.5)
nRBC: 0 % (ref 0.0–0.2)

## 2018-05-14 LAB — COMPREHENSIVE METABOLIC PANEL
ALBUMIN: 3.5 g/dL (ref 3.5–5.0)
ALT: 482 U/L — ABNORMAL HIGH (ref 0–44)
AST: 120 U/L — AB (ref 15–41)
Alkaline Phosphatase: 127 U/L — ABNORMAL HIGH (ref 38–126)
Anion gap: 8 (ref 5–15)
BUN: 15 mg/dL (ref 8–23)
CHLORIDE: 107 mmol/L (ref 98–111)
CO2: 24 mmol/L (ref 22–32)
Calcium: 8.4 mg/dL — ABNORMAL LOW (ref 8.9–10.3)
Creatinine, Ser: 0.65 mg/dL (ref 0.61–1.24)
GFR calc Af Amer: >60 mL/min (ref >60)
Glucose, Bld: 161 mg/dL — ABNORMAL HIGH (ref 70–99)
POTASSIUM: 4.3 mmol/L (ref 3.5–5.1)
SODIUM: 139 mmol/L (ref 135–145)
Total Bilirubin: 1.6 mg/dL — ABNORMAL HIGH (ref 0.3–1.2)
Total Protein: 7.7 g/dL (ref 6.5–8.1)

## 2018-05-14 LAB — TROPONIN I: TROPONIN I: 0.05 ng/mL — AB (ref <0.03)

## 2018-05-14 LAB — LIPASE, BLOOD: LIPASE: 56 U/L — AB (ref 11–51)

## 2018-05-14 MED ORDER — ONDANSETRON HCL 4 MG PO TABS: 4.0000 mg | ORAL_TABLET | Freq: Once | ORAL | Status: DC

## 2018-05-14 NOTE — ED Provider Notes (Signed)
[facility] Emergency Department Provider Note   ____________________________________________   I have reviewed the triage vital signs and the nursing notes.   HISTORY  Chief Complaint Emesis  History limited by: Not Limited   HPI Marcus Williams is a 62 y.o. male who presents to the emergency department today because of concern for nausea. He states that the nausea started this afternoon. He felt fine this morning. He did have some vomiting with the nausea. Denies any blood in it. Denies any pain associated with the vomiting. He denies any recent change in defecation. States that by the time of my examination he was feeling better. Denies any fevers.    Per medical record review patient has a history of COPD, CHF  Past Medical History:  Diagnosis Date  . A-fib ([place])   . Aortic stenosis, moderate 11/2015  . Arthritis   . CAD (coronary artery disease)   . Chronic systolic CHF (congestive heart failure) ([place])   . COPD (chronic obstructive pulmonary disease) ([place])   . Diabetes mellitus without complication ([place])    INSULIN DEPENDENT  . GERD (gastroesophageal reflux disease)   . History of cardioversion   . Hypertension   . MI (myocardial infarction) ([place])   . OSA on CPAP   . Sepsis ([place])   . Shortness of breath dyspnea   . Stroke ([place])   . SVT (supraventricular tachycardia) [facility])     Patient Active Problem List   Diagnosis Date Noted  . Unstable angina ([place]) 12/03/2017  . Elevated troponin 11/29/2017  . Pneumonia 08/10/2017  . NSTEMI (non-ST elevated myocardial infarction) ([place]) 07/28/2017  . TIA (transient ischemic attack) 03/01/2017  . Aortic stenosis 11/21/2015  . Acute on chronic diastolic CHF (congestive heart failure), NYHA class 3 ([place]) 11/21/2015  . Aortic valve, bicuspid 09/24/2015  . Cellulitis 05/04/2015  . Acute respiratory failure with hypoxia ([place]) 03/26/2015  . CHF (congestive heart failure) ([place]) 01/15/2015  . HTN (hypertension)  01/14/2015  . Type II diabetes mellitus ([place]) 01/14/2015  . COPD (chronic obstructive pulmonary disease) ([place]) 01/14/2015  . OSA on CPAP 01/14/2015  . CAD (coronary artery disease) 01/14/2015  . A-fib ([place]) 01/14/2015  . Pain of left calf 01/14/2015  . Acute on chronic systolic CHF (congestive heart failure) ([place]) 01/14/2015  . COPD exacerbation ([place]) 12/22/2014  . Diabetes ([place]) 10/14/2014    Past Surgical History:  Procedure Laterality Date  . 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 BUNIONECTOMY    . CARDIAC CATHETERIZATION N/A 10/02/2015   Procedure: Coronary/Grafts Angiography;  Surgeon: Teodoro Spray, MD;  Location: [place] [facility];  Service: Cardiovascular;  Laterality: N/A;  . CARDIAC CATHETERIZATION N/A 11/21/2015   Procedure: Right and Left Heart Cath;  Surgeon: Sherren Mocha, MD;  Location: [place] [facility];  Service: Cardiovascular;  Laterality: N/A;  . CORONARY ARTERY BYPASS GRAFT    . CORONARY STENT INTERVENTION N/A 07/30/2017   Procedure: CORONARY STENT INTERVENTION;  Surgeon: Isaias Cowman, MD;  Location: [place] [facility];  Service: Cardiovascular;  Laterality: N/A;  . CORONARY STENT INTERVENTION N/A 10/23/2017   Procedure: CORONARY STENT INTERVENTION;  Surgeon: Yolonda Kida, MD;  Location: [place] [facility];  Service: Cardiovascular;  Laterality: N/A;  . KNEE SURGERY    . LEFT HEART CATH AND CORONARY ANGIOGRAPHY N/A 10/30/2017   Procedure: LEFT HEART CATH AND CORONARY ANGIOGRAPHY;  Surgeon: Teodoro Spray, MD;  Location: [place] [facility];  Service: Cardiovascular;  Laterality: N/A;  . LEFT HEART CATH AND CORS/GRAFTS ANGIOGRAPHY N/A 10/22/2017   Procedure: LEFT HEART CATH AND CORS/GRAFTS ANGIOGRAPHY;  Surgeon: Corey Skains, MD;  Location: [place] [facility];  Service: Cardiovascular;  Laterality: N/A;  . quadruple bypass    .  RIGHT/LEFT HEART CATH AND CORONARY/GRAFT ANGIOGRAPHY N/A 07/30/2017   Procedure: RIGHT/LEFT HEART CATH AND CORONARY/GRAFT ANGIOGRAPHY;  Surgeon: Isaias Cowman, MD;  Location: [place] [facility];  Service: Cardiovascular;  Laterality: N/A;  . TEE WITHOUT CARDIOVERSION  11/21/2015  . TEE WITHOUT CARDIOVERSION N/A 11/21/2015   Procedure: TRANSESOPHAGEAL ECHOCARDIOGRAM (TEE);  Surgeon: Larey Dresser, MD;  Location: [place];  Service: Cardiovascular;  Laterality: N/A;  . TRACHEOSTOMY      Prior to Admission medications   Medication Sig Start Date End Date Taking? Authorizing Provider  albuterol (PROVENTIL HFA;VENTOLIN HFA) 108 (90 Base) MCG/ACT inhaler Inhale 2 puffs into the lungs every 6 (six) hours as needed for wheezing or shortness of breath. 07/11/15   Flora Lipps, MD  apixaban (ELIQUIS) 5 MG TABS tablet Take 5 mg by mouth 2 (two) times daily.    [provider]  atorvastatin (LIPITOR) 40 MG tablet Take 1 tablet (40 mg total) by mouth daily at 6 PM. 10/24/17   Gouru, Aruna, MD  baclofen (LIORESAL) 10 MG tablet Take 10 mg by mouth 2 (two) times daily as needed for muscle spasms.     [provider]  chlorpheniramine-HYDROcodone (TUSSIONEX PENNKINETIC ER) 10-8 MG/5ML SUER Take 5 mLs by mouth every 12 (twelve) hours as needed. 05/06/18   Gregor Hams, MD  clopidogrel (PLAVIX) 75 MG tablet Take 1 tablet (75 mg total) by mouth daily with breakfast. 08/01/17   Max Sane, MD  diltiazem (CARDIZEM CD) 180 MG 24 hr capsule Take 180 mg by mouth 2 (two) times daily.    [provider]  DULoxetine (CYMBALTA) 20 MG capsule Take 20 mg by mouth daily.    [provider]  Fluticasone-Salmeterol (ADVAIR) 250-50 MCG/DOSE AEPB Inhale 1 puff into the lungs 2 (two) times daily. 11/30/17   Gladstone Lighter, MD  insulin NPH-regular Human (NOVOLIN 70/30) (70-30) 100 UNIT/ML injection Inject 85-100 Units into the skin 2 (two) times daily with a meal. 85 units in the  morning and 100 units in the evening    [provider]  isosorbide mononitrate (IMDUR) 30 MG 24 hr tablet Take 1 tablet (30 mg total) by mouth daily. 12/06/17   Gladstone Lighter, MD  lisinopril (PRINIVIL,ZESTRIL) 10 MG tablet Take 1 tablet (10 mg total) by mouth daily. 08/14/17   Gladstone Lighter, MD  metFORMIN (GLUCOPHAGE) 1000 MG tablet Take 1,000 mg by mouth 2 (two) times daily with a meal.    [provider]  metoprolol (LOPRESSOR) 100 MG tablet Take 100 mg by mouth 2 (two) times daily. 10/15/15   [provider]  nitroGLYCERIN (NITROSTAT) 0.4 MG SL tablet Place 0.4 mg under the tongue every 5 (five) minutes as needed for chest pain.    [provider]  pantoprazole (PROTONIX) 40 MG tablet Take 1 tablet (40 mg total) by mouth 2 (two) times daily. 12/05/17   Gladstone Lighter, MD  potassium chloride 20 MEQ TBCR Take 20 mEq by mouth daily. 11/23/15   Shirley Friar, PA-C  pramipexole (MIRAPEX) 0.5 MG tablet Take 0.5 mg by mouth 3 (three) times daily as needed.     [provider]  torsemide (DEMADEX) 20 MG tablet Take 2 tablets (40 mg total) by mouth daily. 08/14/17   Gladstone Lighter, MD  zolpidem (AMBIEN) 5 MG tablet Take 1 tablet (5 mg total) by mouth at bedtime as needed for sleep. 11/01/17   Fritzi Mandes, MD    Allergies Contrast media [iodinated diagnostic agents] and Versed [midazolam]  Family History  Problem Relation Age of Onset  . Stroke Mother   . Heart attack Mother   . Hypertension Mother   . Heart attack Father   . Hypertension Father   . Heart attack Brother        #1  . Diabetes Brother        #1  . Heart disease Brother        #2  . Lung disease Brother        #2  . Hypertension Brother        #2  . Diabetes Brother        #2    Social History Social History   Tobacco Use  . Smoking status: Never Smoker  . Smokeless tobacco: Never Used  Substance Use Topics  . Alcohol use: No  . Drug use: No     Review of Systems Constitutional: No fever/chills Eyes: No visual changes. ENT: No sore throat. Cardiovascular: Denies chest pain. Respiratory: Positive for shortness of breath. Gastrointestinal: Positive for nausea and emesis.  Genitourinary: Negative for dysuria. Musculoskeletal: Negative for back pain. Skin: Negative for rash. Neurological: Negative for headaches, focal weakness or numbness.  ____________________________________________   PHYSICAL EXAM:  VITAL SIGNS: ED Triage Vitals  Enc Vitals Group     BP 05/14/18 1559 140/80     Pulse Rate 05/14/18 1559 96     Resp 05/14/18 1559 (!) 24     Temp 05/14/18 1559 98.1 F (36.7 C)     Temp Source 05/14/18 1559 Oral     SpO2 05/14/18 1559 99 %     Weight 05/14/18 1600 255 lb 11.7 oz (116 kg)     Height --      Head Circumference --      Peak Flow --      Pain Score 05/14/18 1559 9   Constitutional: Alert and oriented.  Eyes: Conjunctivae are normal.  ENT      Head: Normocephalic and atraumatic.      Nose: No congestion/rhinnorhea.      Mouth/Throat: Mucous membranes are moist.      Neck: No stridor. Hematological/Lymphatic/Immunilogical: No cervical lymphadenopathy. Cardiovascular: Normal rate, regular rhythm.  No murmurs, rubs, or gallops. Respiratory: Normal respiratory effort without tachypnea nor retractions. Breath sounds are clear and equal bilaterally. No wheezes/rales/rhonchi. Gastrointestinal: Soft and non tender. No rebound. No guarding.  Genitourinary: Deferred Musculoskeletal: Normal range of motion in all extremities. No lower extremity edema. Neurologic:  Normal speech and language. No gross focal neurologic deficits are appreciated.  Skin:  Skin is warm, dry and intact. No rash noted. Psychiatric: Mood and affect are normal. Speech and behavior are normal. Patient exhibits appropriate insight and judgment.  ____________________________________________    LABS (pertinent  positives/negatives)  Trop 0.05 CMP na 139, k 4.3, glu 161, cr 0.65, ast 120, alt 482, alk phos 127, t bili 1.6 Lipase 56 CBC wbc 12.0, hgb 10.0, plt 177  ____________________________________________   EKG  I, Nance Pear, attending physician, personally viewed and interpreted this EKG  EKG Time: 1554 Rate: 97 Rhythm: normal sinus rhythm Axis: left axis deviation Intervals: qtc  462 QRS: narrow, q waves III, aVF, V3 ST changes: no st elevation Impression: abnormal ekg   ____________________________________________    RADIOLOGY  None  ____________________________________________   PROCEDURES  Procedures  ____________________________________________   INITIAL IMPRESSION / ASSESSMENT AND PLAN / ED COURSE  Pertinent labs & imaging results that were available during my care of the patient were reviewed by me and considered in my medical decision making (see chart for details).   Presented to the emergency department today because of concerns for nausea and emesis.  At the time my exam his symptoms had improved.  Blood work shows some elevation of LFTs however patient without any tenderness to the central right upper quadrant.  I do wonder if it could be secondary to possible stomach virus and GI upset.  Patient did feel comfortable going home.  Encourage primary care follow-up.  ____________________________________________   FINAL CLINICAL IMPRESSION(S) / ED DIAGNOSES  Final diagnoses:  Vomiting, intractability of vomiting not specified, presence of nausea not specified, unspecified vomiting type     Note: This dictation was prepared with Dragon dictation. Any transcriptional errors that result from this process are unintentional     Nance Pear, MD 05/14/18 2014

## 2018-05-14 NOTE — ED Notes (Signed)
Patient verbalized understanding of discharge instructions, no questions. Patient out of ED via wheelchair in no distress.  

## 2018-05-14 NOTE — ED Triage Notes (Signed)
Patient c/o nausea, emesis, abdominal pain, and weakness. Patient reports pacemaker implanted 1 month ago.

## 2018-05-14 NOTE — Discharge Instructions (Addendum)
Please seek medical attention for any high fevers, chest pain, shortness of breath, change in behavior, persistent vomiting, bloody stool or any other new or concerning symptoms.  

## 2018-05-15 DIAGNOSIS — F319 Bipolar disorder, unspecified: Secondary | ICD-10-CM | POA: Diagnosis not present

## 2018-05-15 DIAGNOSIS — E119 Type 2 diabetes mellitus without complications: Secondary | ICD-10-CM | POA: Diagnosis not present

## 2018-05-15 DIAGNOSIS — J449 Chronic obstructive pulmonary disease, unspecified: Secondary | ICD-10-CM | POA: Diagnosis not present

## 2018-05-15 DIAGNOSIS — I501 Left ventricular failure: Secondary | ICD-10-CM | POA: Diagnosis not present

## 2018-05-15 DIAGNOSIS — J9611 Chronic respiratory failure with hypoxia: Secondary | ICD-10-CM | POA: Diagnosis not present

## 2018-05-15 DIAGNOSIS — I2510 Atherosclerotic heart disease of native coronary artery without angina pectoris: Secondary | ICD-10-CM | POA: Diagnosis not present

## 2018-05-15 DIAGNOSIS — F419 Anxiety disorder, unspecified: Secondary | ICD-10-CM | POA: Diagnosis not present

## 2018-05-15 DIAGNOSIS — I110 Hypertensive heart disease with heart failure: Secondary | ICD-10-CM | POA: Diagnosis not present

## 2018-05-15 DIAGNOSIS — I480 Paroxysmal atrial fibrillation: Secondary | ICD-10-CM | POA: Diagnosis not present

## 2018-05-16 DIAGNOSIS — E119 Type 2 diabetes mellitus without complications: Secondary | ICD-10-CM | POA: Diagnosis not present

## 2018-05-16 DIAGNOSIS — I501 Left ventricular failure: Secondary | ICD-10-CM | POA: Diagnosis not present

## 2018-05-16 DIAGNOSIS — J9611 Chronic respiratory failure with hypoxia: Secondary | ICD-10-CM | POA: Diagnosis not present

## 2018-05-16 DIAGNOSIS — I2510 Atherosclerotic heart disease of native coronary artery without angina pectoris: Secondary | ICD-10-CM | POA: Diagnosis not present

## 2018-05-16 DIAGNOSIS — J449 Chronic obstructive pulmonary disease, unspecified: Secondary | ICD-10-CM | POA: Diagnosis not present

## 2018-05-16 DIAGNOSIS — F419 Anxiety disorder, unspecified: Secondary | ICD-10-CM | POA: Diagnosis not present

## 2018-05-16 DIAGNOSIS — I110 Hypertensive heart disease with heart failure: Secondary | ICD-10-CM | POA: Diagnosis not present

## 2018-05-16 DIAGNOSIS — F319 Bipolar disorder, unspecified: Secondary | ICD-10-CM | POA: Diagnosis not present

## 2018-05-16 DIAGNOSIS — I480 Paroxysmal atrial fibrillation: Secondary | ICD-10-CM | POA: Diagnosis not present

## 2018-05-17 DIAGNOSIS — I480 Paroxysmal atrial fibrillation: Secondary | ICD-10-CM | POA: Diagnosis not present

## 2018-05-17 DIAGNOSIS — J9611 Chronic respiratory failure with hypoxia: Secondary | ICD-10-CM | POA: Diagnosis not present

## 2018-05-17 DIAGNOSIS — I501 Left ventricular failure: Secondary | ICD-10-CM | POA: Diagnosis not present

## 2018-05-17 DIAGNOSIS — J449 Chronic obstructive pulmonary disease, unspecified: Secondary | ICD-10-CM | POA: Diagnosis not present

## 2018-05-17 DIAGNOSIS — I2510 Atherosclerotic heart disease of native coronary artery without angina pectoris: Secondary | ICD-10-CM | POA: Diagnosis not present

## 2018-05-17 DIAGNOSIS — I110 Hypertensive heart disease with heart failure: Secondary | ICD-10-CM | POA: Diagnosis not present

## 2018-05-17 DIAGNOSIS — F419 Anxiety disorder, unspecified: Secondary | ICD-10-CM | POA: Diagnosis not present

## 2018-05-17 DIAGNOSIS — F319 Bipolar disorder, unspecified: Secondary | ICD-10-CM | POA: Diagnosis not present

## 2018-05-17 DIAGNOSIS — E119 Type 2 diabetes mellitus without complications: Secondary | ICD-10-CM | POA: Diagnosis not present

## 2018-05-18 DIAGNOSIS — I501 Left ventricular failure: Secondary | ICD-10-CM | POA: Diagnosis not present

## 2018-05-18 DIAGNOSIS — E119 Type 2 diabetes mellitus without complications: Secondary | ICD-10-CM | POA: Diagnosis not present

## 2018-05-18 DIAGNOSIS — I2510 Atherosclerotic heart disease of native coronary artery without angina pectoris: Secondary | ICD-10-CM | POA: Diagnosis not present

## 2018-05-18 DIAGNOSIS — J449 Chronic obstructive pulmonary disease, unspecified: Secondary | ICD-10-CM | POA: Diagnosis not present

## 2018-05-18 DIAGNOSIS — F319 Bipolar disorder, unspecified: Secondary | ICD-10-CM | POA: Diagnosis not present

## 2018-05-18 DIAGNOSIS — F419 Anxiety disorder, unspecified: Secondary | ICD-10-CM | POA: Diagnosis not present

## 2018-05-18 DIAGNOSIS — I480 Paroxysmal atrial fibrillation: Secondary | ICD-10-CM | POA: Diagnosis not present

## 2018-05-18 DIAGNOSIS — J9611 Chronic respiratory failure with hypoxia: Secondary | ICD-10-CM | POA: Diagnosis not present

## 2018-05-18 DIAGNOSIS — I110 Hypertensive heart disease with heart failure: Secondary | ICD-10-CM | POA: Diagnosis not present

## 2018-05-19 ENCOUNTER — Inpatient Hospital Stay
Admission: EM | Admit: 2018-05-19 | Discharge: 2018-06-01 | DRG: 291 | Disposition: A | Discharge status: Skilled Nursing Facility | Payer: Medicare HMO | Attending: Internal Medicine | Admitting: Internal Medicine

## 2018-05-19 ENCOUNTER — Other Ambulatory Visit: Payer: Self-pay

## 2018-05-19 ENCOUNTER — Inpatient Hospital Stay
Admit: 2018-05-19 | Discharge: 2018-05-19 | Disposition: A | Discharge status: Home / Self Care | Payer: Medicare HMO | Attending: Internal Medicine | Admitting: Internal Medicine

## 2018-05-19 ENCOUNTER — Emergency Department: Payer: Medicare HMO

## 2018-05-19 DIAGNOSIS — I480 Paroxysmal atrial fibrillation: Secondary | ICD-10-CM | POA: Diagnosis present

## 2018-05-19 DIAGNOSIS — Z930 Tracheostomy status: Secondary | ICD-10-CM | POA: Diagnosis not present

## 2018-05-19 DIAGNOSIS — Z888 Allergy status to other drugs, medicaments and biological substances status: Secondary | ICD-10-CM

## 2018-05-19 DIAGNOSIS — I509 Heart failure, unspecified: Secondary | ICD-10-CM | POA: Diagnosis not present

## 2018-05-19 DIAGNOSIS — R402134 Coma scale, eyes open, to sound, 24 hours or more after hospital admission: Secondary | ICD-10-CM | POA: Diagnosis not present

## 2018-05-19 DIAGNOSIS — Z8673 Personal history of transient ischemic attack (TIA), and cerebral infarction without residual deficits: Secondary | ICD-10-CM

## 2018-05-19 DIAGNOSIS — I4891 Unspecified atrial fibrillation: Secondary | ICD-10-CM | POA: Diagnosis not present

## 2018-05-19 DIAGNOSIS — I959 Hypotension, unspecified: Secondary | ICD-10-CM | POA: Diagnosis not present

## 2018-05-19 DIAGNOSIS — I459 Conduction disorder, unspecified: Secondary | ICD-10-CM | POA: Diagnosis present

## 2018-05-19 DIAGNOSIS — I10 Essential (primary) hypertension: Secondary | ICD-10-CM | POA: Diagnosis not present

## 2018-05-19 DIAGNOSIS — Z951 Presence of aortocoronary bypass graft: Secondary | ICD-10-CM | POA: Diagnosis not present

## 2018-05-19 DIAGNOSIS — Z9989 Dependence on other enabling machines and devices: Secondary | ICD-10-CM

## 2018-05-19 DIAGNOSIS — Z794 Long term (current) use of insulin: Secondary | ICD-10-CM

## 2018-05-19 DIAGNOSIS — I248 Other forms of acute ischemic heart disease: Secondary | ICD-10-CM | POA: Diagnosis not present

## 2018-05-19 DIAGNOSIS — G4733 Obstructive sleep apnea (adult) (pediatric): Secondary | ICD-10-CM | POA: Diagnosis not present

## 2018-05-19 DIAGNOSIS — J9611 Chronic respiratory failure with hypoxia: Secondary | ICD-10-CM | POA: Diagnosis not present

## 2018-05-19 DIAGNOSIS — D631 Anemia in chronic kidney disease: Secondary | ICD-10-CM | POA: Diagnosis present

## 2018-05-19 DIAGNOSIS — I130 Hypertensive heart and chronic kidney disease with heart failure and stage 1 through stage 4 chronic kidney disease, or unspecified chronic kidney disease: Secondary | ICD-10-CM | POA: Diagnosis not present

## 2018-05-19 DIAGNOSIS — T8131XA Disruption of external operation (surgical) wound, not elsewhere classified, initial encounter: Secondary | ICD-10-CM | POA: Diagnosis not present

## 2018-05-19 DIAGNOSIS — I252 Old myocardial infarction: Secondary | ICD-10-CM

## 2018-05-19 DIAGNOSIS — I5043 Acute on chronic combined systolic (congestive) and diastolic (congestive) heart failure: Secondary | ICD-10-CM | POA: Diagnosis not present

## 2018-05-19 DIAGNOSIS — I5033 Acute on chronic diastolic (congestive) heart failure: Secondary | ICD-10-CM | POA: Diagnosis not present

## 2018-05-19 DIAGNOSIS — I501 Left ventricular failure: Secondary | ICD-10-CM | POA: Diagnosis not present

## 2018-05-19 DIAGNOSIS — J9601 Acute respiratory failure with hypoxia: Secondary | ICD-10-CM

## 2018-05-19 DIAGNOSIS — E1122 Type 2 diabetes mellitus with diabetic chronic kidney disease: Secondary | ICD-10-CM | POA: Diagnosis present

## 2018-05-19 DIAGNOSIS — R402234 Coma scale, best verbal response, inappropriate words, 24 hours or more after hospital admission: Secondary | ICD-10-CM | POA: Diagnosis not present

## 2018-05-19 DIAGNOSIS — D649 Anemia, unspecified: Secondary | ICD-10-CM | POA: Diagnosis not present

## 2018-05-19 DIAGNOSIS — E875 Hyperkalemia: Secondary | ICD-10-CM | POA: Diagnosis not present

## 2018-05-19 DIAGNOSIS — S21102A Unspecified open wound of left front wall of thorax without penetration into thoracic cavity, initial encounter: Secondary | ICD-10-CM | POA: Diagnosis not present

## 2018-05-19 DIAGNOSIS — N179 Acute kidney failure, unspecified: Secondary | ICD-10-CM | POA: Diagnosis not present

## 2018-05-19 DIAGNOSIS — Z952 Presence of prosthetic heart valve: Secondary | ICD-10-CM

## 2018-05-19 DIAGNOSIS — D72829 Elevated white blood cell count, unspecified: Secondary | ICD-10-CM | POA: Diagnosis present

## 2018-05-19 DIAGNOSIS — F419 Anxiety disorder, unspecified: Secondary | ICD-10-CM | POA: Diagnosis not present

## 2018-05-19 DIAGNOSIS — Z89421 Acquired absence of other right toe(s): Secondary | ICD-10-CM

## 2018-05-19 DIAGNOSIS — I110 Hypertensive heart disease with heart failure: Secondary | ICD-10-CM | POA: Diagnosis not present

## 2018-05-19 DIAGNOSIS — Z6841 Body Mass Index (BMI) 40.0 and over, adult: Secondary | ICD-10-CM

## 2018-05-19 DIAGNOSIS — Z7901 Long term (current) use of anticoagulants: Secondary | ICD-10-CM | POA: Diagnosis not present

## 2018-05-19 DIAGNOSIS — E119 Type 2 diabetes mellitus without complications: Secondary | ICD-10-CM | POA: Diagnosis not present

## 2018-05-19 DIAGNOSIS — I5023 Acute on chronic systolic (congestive) heart failure: Secondary | ICD-10-CM | POA: Diagnosis not present

## 2018-05-19 DIAGNOSIS — I25798 Atherosclerosis of other coronary artery bypass graft(s) with other forms of angina pectoris: Secondary | ICD-10-CM | POA: Diagnosis not present

## 2018-05-19 DIAGNOSIS — K219 Gastro-esophageal reflux disease without esophagitis: Secondary | ICD-10-CM | POA: Diagnosis present

## 2018-05-19 DIAGNOSIS — R402344 Coma scale, best motor response, flexion withdrawal, 24 hours or more after hospital admission: Secondary | ICD-10-CM | POA: Diagnosis not present

## 2018-05-19 DIAGNOSIS — Y712 Prosthetic and other implants, materials and accessory cardiovascular devices associated with adverse incidents: Secondary | ICD-10-CM | POA: Diagnosis not present

## 2018-05-19 DIAGNOSIS — L89152 Pressure ulcer of sacral region, stage 2: Secondary | ICD-10-CM | POA: Diagnosis present

## 2018-05-19 DIAGNOSIS — L8990 Pressure ulcer of unspecified site, unspecified stage: Secondary | ICD-10-CM

## 2018-05-19 DIAGNOSIS — R000 Tachycardia, unspecified: Secondary | ICD-10-CM | POA: Diagnosis not present

## 2018-05-19 DIAGNOSIS — N189 Chronic kidney disease, unspecified: Secondary | ICD-10-CM | POA: Diagnosis present

## 2018-05-19 DIAGNOSIS — R0602 Shortness of breath: Secondary | ICD-10-CM | POA: Diagnosis not present

## 2018-05-19 DIAGNOSIS — Z7902 Long term (current) use of antithrombotics/antiplatelets: Secondary | ICD-10-CM

## 2018-05-19 DIAGNOSIS — J449 Chronic obstructive pulmonary disease, unspecified: Secondary | ICD-10-CM | POA: Diagnosis not present

## 2018-05-19 DIAGNOSIS — R001 Bradycardia, unspecified: Secondary | ICD-10-CM | POA: Diagnosis not present

## 2018-05-19 DIAGNOSIS — T82524A Displacement of infusion catheter, initial encounter: Secondary | ICD-10-CM | POA: Diagnosis not present

## 2018-05-19 DIAGNOSIS — Z9119 Patient's noncompliance with other medical treatment and regimen: Secondary | ICD-10-CM

## 2018-05-19 DIAGNOSIS — R0989 Other specified symptoms and signs involving the circulatory and respiratory systems: Secondary | ICD-10-CM | POA: Diagnosis not present

## 2018-05-19 DIAGNOSIS — Z955 Presence of coronary angioplasty implant and graft: Secondary | ICD-10-CM

## 2018-05-19 DIAGNOSIS — R062 Wheezing: Secondary | ICD-10-CM | POA: Diagnosis not present

## 2018-05-19 DIAGNOSIS — X58XXXA Exposure to other specified factors, initial encounter: Secondary | ICD-10-CM | POA: Diagnosis present

## 2018-05-19 DIAGNOSIS — M6281 Muscle weakness (generalized): Secondary | ICD-10-CM | POA: Diagnosis not present

## 2018-05-19 DIAGNOSIS — Z79899 Other long term (current) drug therapy: Secondary | ICD-10-CM

## 2018-05-19 DIAGNOSIS — E876 Hypokalemia: Secondary | ICD-10-CM | POA: Diagnosis not present

## 2018-05-19 DIAGNOSIS — I2510 Atherosclerotic heart disease of native coronary artery without angina pectoris: Secondary | ICD-10-CM | POA: Diagnosis present

## 2018-05-19 DIAGNOSIS — I350 Nonrheumatic aortic (valve) stenosis: Secondary | ICD-10-CM | POA: Diagnosis present

## 2018-05-19 DIAGNOSIS — Y828 Other medical devices associated with adverse incidents: Secondary | ICD-10-CM | POA: Diagnosis not present

## 2018-05-19 DIAGNOSIS — Z91041 Radiographic dye allergy status: Secondary | ICD-10-CM

## 2018-05-19 DIAGNOSIS — F319 Bipolar disorder, unspecified: Secondary | ICD-10-CM | POA: Diagnosis not present

## 2018-05-19 DIAGNOSIS — G4730 Sleep apnea, unspecified: Secondary | ICD-10-CM | POA: Diagnosis present

## 2018-05-19 DIAGNOSIS — E1165 Type 2 diabetes mellitus with hyperglycemia: Secondary | ICD-10-CM | POA: Diagnosis not present

## 2018-05-19 DIAGNOSIS — R7989 Other specified abnormal findings of blood chemistry: Secondary | ICD-10-CM | POA: Diagnosis not present

## 2018-05-19 DIAGNOSIS — R0902 Hypoxemia: Secondary | ICD-10-CM

## 2018-05-19 DIAGNOSIS — I5021 Acute systolic (congestive) heart failure: Secondary | ICD-10-CM | POA: Diagnosis not present

## 2018-05-19 DIAGNOSIS — Z7951 Long term (current) use of inhaled steroids: Secondary | ICD-10-CM

## 2018-05-19 DIAGNOSIS — Z9111 Patient's noncompliance with dietary regimen: Secondary | ICD-10-CM

## 2018-05-19 DIAGNOSIS — E1140 Type 2 diabetes mellitus with diabetic neuropathy, unspecified: Secondary | ICD-10-CM | POA: Diagnosis not present

## 2018-05-19 LAB — COMPREHENSIVE METABOLIC PANEL
ALT: 141 U/L — AB (ref 0–44)
AST: 42 U/L — AB (ref 15–41)
Albumin: 3.2 g/dL — ABNORMAL LOW (ref 3.5–5.0)
Alkaline Phosphatase: 105 U/L (ref 38–126)
Anion gap: 9 (ref 5–15)
BUN: 13 mg/dL (ref 8–23)
CALCIUM: 8.6 mg/dL — AB (ref 8.9–10.3)
CHLORIDE: 107 mmol/L (ref 98–111)
CO2: 25 mmol/L (ref 22–32)
CREATININE: 0.66 mg/dL (ref 0.61–1.24)
Glucose, Bld: 190 mg/dL — ABNORMAL HIGH (ref 70–99)
Potassium: 4.5 mmol/L (ref 3.5–5.1)
Sodium: 141 mmol/L (ref 135–145)
Total Bilirubin: 1.4 mg/dL — ABNORMAL HIGH (ref 0.3–1.2)
Total Protein: 7.3 g/dL (ref 6.5–8.1)

## 2018-05-19 LAB — BLOOD GAS, VENOUS
ACID-BASE EXCESS: 1 mmol/L (ref 0.0–2.0)
BICARBONATE: 26.6 mmol/L (ref 20.0–28.0)
FIO2: 0.21
O2 Saturation: 72.3 %
PCO2 VEN: 45 mmHg (ref 44.0–60.0)
PH VEN: 7.38 (ref 7.250–7.430)
PO2 VEN: 39 mmHg (ref 32.0–45.0)
Patient temperature: 37

## 2018-05-19 LAB — CBC WITH DIFFERENTIAL/PLATELET
ABS IMMATURE GRANULOCYTES: 0.03 10*3/uL (ref 0.00–0.07)
BASOS PCT: 1 %
Basophils Absolute: 0.1 10*3/uL (ref 0.0–0.1)
EOS ABS: 0.3 10*3/uL (ref 0.0–0.5)
Eosinophils Relative: 5 %
HEMATOCRIT: 32.3 % — AB (ref 39.0–52.0)
Hemoglobin: 9.3 g/dL — ABNORMAL LOW (ref 13.0–17.0)
IMMATURE GRANULOCYTES: 1 %
LYMPHS ABS: 1 10*3/uL (ref 0.7–4.0)
Lymphocytes Relative: 18 %
MCH: 23.1 pg — AB (ref 26.0–34.0)
MCHC: 28.8 g/dL — AB (ref 30.0–36.0)
MCV: 80.3 fL (ref 80.0–100.0)
MONO ABS: 0.6 10*3/uL (ref 0.1–1.0)
MONOS PCT: 10 %
NEUTROS ABS: 3.6 10*3/uL (ref 1.7–7.7)
NEUTROS PCT: 65 %
PLATELETS: 175 10*3/uL (ref 150–400)
RBC: 4.02 MIL/uL — ABNORMAL LOW (ref 4.22–5.81)
RDW: 18.5 % — ABNORMAL HIGH (ref 11.5–15.5)
WBC: 5.5 10*3/uL (ref 4.0–10.5)
nRBC: 0 % (ref 0.0–0.2)

## 2018-05-19 LAB — GLUCOSE, CAPILLARY: GLUCOSE-CAPILLARY: 227 mg/dL — AB (ref 70–99)

## 2018-05-19 LAB — TROPONIN I: TROPONIN I: 0.03 ng/mL — AB (ref <0.03)

## 2018-05-19 LAB — BRAIN NATRIURETIC PEPTIDE: B NATRIURETIC PEPTIDE 5: 1939 pg/mL — AB (ref 0.0–100.0)

## 2018-05-19 MED ORDER — ATORVASTATIN CALCIUM 20 MG PO TABS
40.0000 mg | ORAL_TABLET | Freq: Every day | ORAL | Status: DC
  Administered 2018-05-19 – 2018-05-31 (×12): 40 mg via ORAL
  Filled 2018-05-19 (×12): qty 2

## 2018-05-19 MED ORDER — IPRATROPIUM-ALBUTEROL 0.5-2.5 (3) MG/3ML IN SOLN
3.0000 mL | Freq: Once | RESPIRATORY_TRACT | Status: AC
  Administered 2018-05-19: 3 mL via RESPIRATORY_TRACT
  Filled 2018-05-19: qty 3

## 2018-05-19 MED ORDER — MAGNESIUM SULFATE 2 GM/50ML IV SOLN
2.0000 g | Freq: Once | INTRAVENOUS | Status: AC
  Administered 2018-05-19: 2 g via INTRAVENOUS
  Filled 2018-05-19: qty 50

## 2018-05-19 MED ORDER — APIXABAN 5 MG PO TABS
5.0000 mg | ORAL_TABLET | Freq: Two times a day (BID) | ORAL | Status: DC
  Administered 2018-05-19 – 2018-05-21 (×6): 5 mg via ORAL
  Filled 2018-05-19 (×6): qty 1

## 2018-05-19 MED ORDER — POTASSIUM CHLORIDE CRYS ER 20 MEQ PO TBCR
20.0000 meq | EXTENDED_RELEASE_TABLET | Freq: Every day | ORAL | Status: DC
  Administered 2018-05-19 – 2018-05-20 (×2): 20 meq via ORAL
  Filled 2018-05-19 (×3): qty 1

## 2018-05-19 MED ORDER — FUROSEMIDE 10 MG/ML IJ SOLN
40.0000 mg | Freq: Two times a day (BID) | INTRAMUSCULAR | Status: DC
  Administered 2018-05-19 – 2018-05-20 (×3): 40 mg via INTRAVENOUS
  Filled 2018-05-19 (×4): qty 4

## 2018-05-19 MED ORDER — PERFLUTREN LIPID MICROSPHERE
1.0000 mL | INTRAVENOUS | Status: AC | PRN
  Administered 2018-05-19: 2 mL via INTRAVENOUS
  Filled 2018-05-19: qty 10

## 2018-05-19 MED ORDER — NITROGLYCERIN 0.4 MG SL SUBL: 0.4000 mg | SUBLINGUAL_TABLET | SUBLINGUAL | Status: DC | PRN

## 2018-05-19 MED ORDER — ALBUTEROL SULFATE HFA 108 (90 BASE) MCG/ACT IN AERS: 2.0000 | INHALATION_SPRAY | Freq: Four times a day (QID) | RESPIRATORY_TRACT | Status: DC | PRN

## 2018-05-19 MED ORDER — METFORMIN HCL 500 MG PO TABS
1000.0000 mg | ORAL_TABLET | Freq: Two times a day (BID) | ORAL | Status: DC
  Administered 2018-05-19 – 2018-05-20 (×4): 1000 mg via ORAL
  Filled 2018-05-19 (×5): qty 2

## 2018-05-19 MED ORDER — FUROSEMIDE 10 MG/ML IJ SOLN
80.0000 mg | Freq: Once | INTRAMUSCULAR | Status: AC
  Administered 2018-05-19: 80 mg via INTRAVENOUS
  Filled 2018-05-19: qty 8

## 2018-05-19 MED ORDER — PANTOPRAZOLE SODIUM 40 MG PO TBEC
40.0000 mg | DELAYED_RELEASE_TABLET | Freq: Two times a day (BID) | ORAL | Status: DC
  Administered 2018-05-19 – 2018-06-01 (×26): 40 mg via ORAL
  Filled 2018-05-19 (×26): qty 1

## 2018-05-19 MED ORDER — ALBUTEROL SULFATE (2.5 MG/3ML) 0.083% IN NEBU
2.5000 mg | INHALATION_SOLUTION | Freq: Four times a day (QID) | RESPIRATORY_TRACT | Status: DC | PRN
  Administered 2018-05-25: 2.5 mg via RESPIRATORY_TRACT
  Filled 2018-05-19: qty 3

## 2018-05-19 MED ORDER — CLOPIDOGREL BISULFATE 75 MG PO TABS
75.0000 mg | ORAL_TABLET | Freq: Every day | ORAL | Status: DC
  Administered 2018-05-19 – 2018-06-01 (×14): 75 mg via ORAL
  Filled 2018-05-19 (×14): qty 1

## 2018-05-19 MED ORDER — ISOSORBIDE MONONITRATE ER 30 MG PO TB24
30.0000 mg | ORAL_TABLET | Freq: Every day | ORAL | Status: DC
  Administered 2018-05-19 – 2018-05-22 (×3): 30 mg via ORAL
  Filled 2018-05-19 (×4): qty 1

## 2018-05-19 MED ORDER — LISINOPRIL 10 MG PO TABS
10.0000 mg | ORAL_TABLET | Freq: Every day | ORAL | Status: DC
  Administered 2018-05-19: 10 mg via ORAL
  Filled 2018-05-19 (×3): qty 1

## 2018-05-19 MED ORDER — ASPIRIN 81 MG PO CHEW
324.0000 mg | CHEWABLE_TABLET | Freq: Once | ORAL | Status: AC
  Administered 2018-05-19: 324 mg via ORAL
  Filled 2018-05-19: qty 4

## 2018-05-19 MED ORDER — MOMETASONE FURO-FORMOTEROL FUM 200-5 MCG/ACT IN AERO
2.0000 | INHALATION_SPRAY | Freq: Two times a day (BID) | RESPIRATORY_TRACT | Status: DC
  Administered 2018-05-19 – 2018-05-22 (×6): 2 via RESPIRATORY_TRACT
  Filled 2018-05-19: qty 8.8

## 2018-05-19 MED ORDER — METOPROLOL TARTRATE 50 MG PO TABS
100.0000 mg | ORAL_TABLET | Freq: Two times a day (BID) | ORAL | Status: DC
  Administered 2018-05-19 – 2018-05-22 (×5): 100 mg via ORAL
  Filled 2018-05-19 (×7): qty 2

## 2018-05-19 MED ORDER — DULOXETINE HCL 20 MG PO CPEP
20.0000 mg | ORAL_CAPSULE | Freq: Every day | ORAL | Status: DC
  Administered 2018-05-19 – 2018-06-01 (×14): 20 mg via ORAL
  Filled 2018-05-19 (×14): qty 1

## 2018-05-19 MED ORDER — ZOLPIDEM TARTRATE 5 MG PO TABS
5.0000 mg | ORAL_TABLET | Freq: Every evening | ORAL | Status: DC | PRN
  Administered 2018-05-19 – 2018-05-30 (×5): 5 mg via ORAL
  Filled 2018-05-19 (×6): qty 1

## 2018-05-19 MED ORDER — DILTIAZEM HCL ER COATED BEADS 180 MG PO CP24
180.0000 mg | ORAL_CAPSULE | Freq: Two times a day (BID) | ORAL | Status: DC
  Administered 2018-05-19 – 2018-05-22 (×6): 180 mg via ORAL
  Filled 2018-05-19 (×9): qty 1

## 2018-05-19 NOTE — ED Notes (Signed)
Pt weaned from bipap to [facility]. Dr Manuella Ghazi at bedside. See flowsheet

## 2018-05-19 NOTE — ED Provider Notes (Signed)
Clinical Course as of May 19 722  Wed May 19, 2018  0716 Patient reassessed.  Breathing improved on BiPAP.  He denies any chest pain or pressure.  States his been compliant with his medications including Eliquis.  Doubt ACS.  More clinically consistent with volume overload.  Receiving Lasix at this moment.  Patient will require hospitalization.   [PR]    Clinical Course User Index [PR] Merlyn Lot, MD      Merlyn Lot, MD 05/19/18 [phone]

## 2018-05-19 NOTE — ED Notes (Signed)
Pt off bipap, on 5 LNC to eat breakfast. Tolerated ok while sitting still but became SOB, tachypnic with movement. Pt placed back on bipap after meal.

## 2018-05-19 NOTE — ED Provider Notes (Addendum)
[facility] Emergency Department Provider Note  ____________________________________________   First MD Initiated Contact with Patient 05/19/18 0615     (approximate)  I have reviewed the triage vital signs and the nursing notes.   HISTORY  Chief Complaint Shortness of Breath   HPI Marcus Williams is a 62 y.o. male who comes to the emergency department with acute onset shortness of breath that began about an hour prior to arrival.  The patient has a complex past medical history including COPD, congestive heart failure, and several months ago had a new aortic valve put in.  He has intermittent atrial fibrillation and reports compliance with his Eliquis.  EMS administered 1 DuoNeb, one albuterol breathing treatment, and 125 mg of Solu-Medrol with minimal improvement.  The patient says "I think this is my lungs not my heart this time".  The patient's wife recently came home from medical rehabilitation and he says that he has had to exert himself more than usual in an effort to help take care of her which she feels like has been exacerbating his symptoms.  His symptoms are worse with exertion and only minimally improved with rest.  Sleeps on 2 pillows.  Denies weight gain.  He says "I hope you keep me for a few days doc".    Past Medical History:  Diagnosis Date  . A-fib ([place])   . Aortic stenosis, moderate 11/2015  . Arthritis   . CAD (coronary artery disease)   . Chronic systolic CHF (congestive heart failure) ([place])   . COPD (chronic obstructive pulmonary disease) ([place])   . Diabetes mellitus without complication ([place])    INSULIN DEPENDENT  . GERD (gastroesophageal reflux disease)   . History of cardioversion   . Hypertension   . MI (myocardial infarction) ([place])   . OSA on CPAP   . Sepsis ([place])   . Shortness of breath dyspnea   . Stroke ([place])   . SVT (supraventricular tachycardia) [facility])     Patient Active Problem List   Diagnosis Date Noted  . Pressure  injury of skin 05/20/2018  . Acute respiratory failure with hypoxemia ([place]) 05/19/2018  . Unstable angina ([place]) 12/03/2017  . Elevated troponin 11/29/2017  . Pneumonia 08/10/2017  . NSTEMI (non-ST elevated myocardial infarction) ([place]) 07/28/2017  . TIA (transient ischemic attack) 03/01/2017  . Aortic stenosis 11/21/2015  . Acute on chronic diastolic CHF (congestive heart failure), NYHA class 3 ([place]) 11/21/2015  . Aortic valve, bicuspid 09/24/2015  . Cellulitis 05/04/2015  . Acute respiratory failure with hypoxia ([place]) 03/26/2015  . CHF (congestive heart failure) ([place]) 01/15/2015  . HTN (hypertension) 01/14/2015  . Type II diabetes mellitus ([place]) 01/14/2015  . COPD (chronic obstructive pulmonary disease) ([place]) 01/14/2015  . OSA on CPAP 01/14/2015  . CAD (coronary artery disease) 01/14/2015  . A-fib ([place]) 01/14/2015  . Pain of left calf 01/14/2015  . Acute on chronic systolic CHF (congestive heart failure) ([place]) 01/14/2015  . COPD exacerbation ([place]) 12/22/2014  . Diabetes ([place]) 10/14/2014    Past Surgical History:  Procedure Laterality Date  . 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 BUNIONECTOMY    . CARDIAC CATHETERIZATION N/A 10/02/2015   Procedure: Coronary/Grafts Angiography;  Surgeon: Teodoro Spray, MD;  Location: [place] CV LAB;  Service: Cardiovascular;  Laterality: N/A;  . CARDIAC CATHETERIZATION N/A 11/21/2015   Procedure: Right and Left Heart Cath;  Surgeon: Sherren Mocha, MD;  Location: [place] [facility];  Service: Cardiovascular;  Laterality: N/A;  . CORONARY ARTERY BYPASS GRAFT    . CORONARY STENT INTERVENTION N/A 07/30/2017   Procedure: CORONARY STENT INTERVENTION;  Surgeon: Isaias Cowman, MD;  Location: [place] [facility];  Service: Cardiovascular;  Laterality: N/A;  . CORONARY STENT INTERVENTION N/A 10/23/2017   Procedure: CORONARY STENT  INTERVENTION;  Surgeon: Yolonda Kida, MD;  Location: [place] [facility] LAB;  Service: Cardiovascular;  Laterality: N/A;  . KNEE SURGERY    . LEFT HEART CATH AND CORONARY ANGIOGRAPHY N/A 10/30/2017   Procedure: LEFT HEART CATH AND CORONARY ANGIOGRAPHY;  Surgeon: Teodoro Spray, MD;  Location: [place] [facility];  Service: Cardiovascular;  Laterality: N/A;  . LEFT HEART CATH AND CORS/GRAFTS ANGIOGRAPHY N/A 10/22/2017   Procedure: LEFT HEART CATH AND CORS/GRAFTS ANGIOGRAPHY;  Surgeon: Corey Skains, MD;  Location: [place] [facility];  Service: Cardiovascular;  Laterality: N/A;  . quadruple bypass    . RIGHT/LEFT HEART CATH AND CORONARY/GRAFT ANGIOGRAPHY N/A 07/30/2017   Procedure: RIGHT/LEFT HEART CATH AND CORONARY/GRAFT ANGIOGRAPHY;  Surgeon: Isaias Cowman, MD;  Location: [place] [facility];  Service: Cardiovascular;  Laterality: N/A;  . TEE WITHOUT CARDIOVERSION  11/21/2015  . TEE WITHOUT CARDIOVERSION N/A 11/21/2015   Procedure: TRANSESOPHAGEAL ECHOCARDIOGRAM (TEE);  Surgeon: Larey Dresser, MD;  Location: [place];  Service: Cardiovascular;  Laterality: N/A;  . TRACHEOSTOMY      Prior to Admission medications   Medication Sig Start Date End Date Taking? Authorizing Provider  albuterol (PROVENTIL HFA;VENTOLIN HFA) 108 (90 Base) MCG/ACT inhaler Inhale 2 puffs into the lungs every 6 (six) hours as needed for wheezing or shortness of breath. 07/11/15  Yes Kasa, Maretta Bees, MD  apixaban (ELIQUIS) 5 MG TABS tablet Take 5 mg by mouth 2 (two) times daily.   Yes [provider]  atorvastatin (LIPITOR) 40 MG tablet Take 1 tablet (40 mg total) by mouth daily at 6 PM. 10/24/17  Yes Gouru, Aruna, MD  DULoxetine (CYMBALTA) 20 MG capsule Take 20 mg by mouth daily.   Yes [provider]  Fluticasone-Salmeterol (ADVAIR) 250-50 MCG/DOSE AEPB Inhale 1 puff into the lungs 2 (two) times daily. 11/30/17  Yes Gladstone Lighter, MD  insulin NPH-regular Human (NOVOLIN 70/30) (70-30)  100 UNIT/ML injection Inject 85-100 Units into the skin 2 (two) times daily with a meal. 85 units in the morning and 100 units in the evening. Patient gets this but has not been taking for a couple months   Yes [provider]  isosorbide mononitrate (IMDUR) 30 MG 24 hr tablet Take 1 tablet (30 mg total) by mouth daily. 12/06/17  Yes Gladstone Lighter, MD  lisinopril (PRINIVIL,ZESTRIL) 10 MG tablet Take 1 tablet (10 mg total) by mouth daily. 08/14/17  Yes Gladstone Lighter, MD  metFORMIN (GLUCOPHAGE) 1000 MG tablet Take 1,000 mg by mouth 2 (two) times daily with a meal.   Yes [provider]  metoprolol (LOPRESSOR) 100 MG tablet Take 100 mg by mouth 2 (two) times daily. 10/15/15  Yes [provider]  pantoprazole (PROTONIX) 40 MG tablet Take 1 tablet (40 mg total) by mouth 2 (two) times daily. 12/05/17  Yes Gladstone Lighter, MD  potassium chloride 20 MEQ TBCR Take 20 mEq by mouth daily. 11/23/15  Yes Shirley Friar, PA-C  torsemide (DEMADEX) 20 MG tablet Take 2 tablets (40 mg total) by mouth daily. 08/14/17  Yes Gladstone Lighter, MD  zolpidem (AMBIEN) 5 MG tablet  Take 1 tablet (5 mg total) by mouth at bedtime as needed for sleep. 11/01/17  Yes Fritzi Mandes, MD  baclofen (LIORESAL) 10 MG tablet Take 10 mg by mouth 2 (two) times daily as needed for muscle spasms.     [provider]  chlorpheniramine-HYDROcodone (TUSSIONEX PENNKINETIC ER) 10-8 MG/5ML SUER Take 5 mLs by mouth every 12 (twelve) hours as needed. Patient not taking: Reported on 05/19/2018 05/06/18   Gregor Hams, MD  clopidogrel (PLAVIX) 75 MG tablet Take 1 tablet (75 mg total) by mouth daily with breakfast. Patient not taking: Reported on 05/19/2018 08/01/17   Max Sane, MD  diltiazem (CARDIZEM CD) 180 MG 24 hr capsule Take 180 mg by mouth 2 (two) times daily.    [provider]  nitroGLYCERIN (NITROSTAT) 0.4 MG SL tablet Place 0.4 mg under the tongue every 5 (five) minutes as needed for  chest pain.    [provider]  pramipexole (MIRAPEX) 0.5 MG tablet Take 0.5 mg by mouth 3 (three) times daily as needed.     [provider]    Allergies Contrast media [iodinated diagnostic agents] and Versed [midazolam]  Family History  Problem Relation Age of Onset  . Stroke Mother   . Heart attack Mother   . Hypertension Mother   . Heart attack Father   . Hypertension Father   . Heart attack Brother        #1  . Diabetes Brother        #1  . Heart disease Brother        #2  . Lung disease Brother        #2  . Hypertension Brother        #2  . Diabetes Brother        #2    Social History Social History   Tobacco Use  . Smoking status: Never Smoker  . Smokeless tobacco: Never Used  Substance Use Topics  . Alcohol use: No  . Drug use: No    Review of Systems Constitutional: No fever/chills Eyes: No visual changes. ENT: No sore throat. Cardiovascular: Denies chest pain. Respiratory: Positive for shortness of breath. Gastrointestinal: No abdominal pain.  No nausea, no vomiting.  No diarrhea.  No constipation. Genitourinary: Negative for dysuria. Musculoskeletal: Negative for back pain. Skin: Negative for rash. Neurological: Negative for headaches, focal weakness or numbness.   ____________________________________________   PHYSICAL EXAM:  VITAL SIGNS: ED Triage Vitals  Enc Vitals Group     BP      Pulse      Resp      Temp      Temp src      SpO2      Weight      Height      Head Circumference      Peak Flow      Pain Score      Pain Loc      Pain Edu?      Excl. in [place]?     Constitutional: Certain oriented x4 appears obviously short of breath and uncomfortable sitting up in bed Eyes: PERRL EOMI. Head: Atraumatic. Nose: No congestion/rhinnorhea. Mouth/Throat: No trismus Neck: No stridor.  Able to lie completely flat although with some discomfort.  JVD is difficult to assess secondary to body habitus Cardiovascular:  Tachycardic rate, regular rhythm. Grossly normal heart sounds.  Good peripheral circulation. Respiratory: Increased respiratory effort with crackles at bilateral bases although lung sounds are equal bilaterally.  I  do not appreciate any wheezing Gastrointestinal: Morbidly obese soft nontender Musculoskeletal: 2+ pitting edema to midshin bilaterally legs are equal in size Neurologic:  Normal speech and language. No gross focal neurologic deficits are appreciated. Skin: Left thoracotomy me incision appears healthy Psychiatric: Mood and affect are normal. Speech and behavior are normal.    ____________________________________________   DIFFERENTIAL includes but not limited to  Pulmonary edema, pulmonary embolism, hemothorax, COPD exacerbation, pneumonia ____________________________________________   LABS (all labs ordered are listed, but only abnormal results are displayed)  Labs Reviewed  COMPREHENSIVE METABOLIC PANEL - Abnormal; Notable for the following components:      Result Value   Glucose, Bld 190 (*)    Calcium 8.6 (*)    Albumin 3.2 (*)    AST 42 (*)    ALT 141 (*)    Total Bilirubin 1.4 (*)    All other components within normal limits  BRAIN NATRIURETIC PEPTIDE - Abnormal; Notable for the following components:   B Natriuretic Peptide 1,939.0 (*)    All other components within normal limits  TROPONIN I - Abnormal; Notable for the following components:   Troponin I 0.03 (*)    All other components within normal limits  CBC WITH DIFFERENTIAL/PLATELET - Abnormal; Notable for the following components:   RBC 4.02 (*)    Hemoglobin 9.3 (*)    HCT 32.3 (*)    MCH 23.1 (*)    MCHC 28.8 (*)    RDW 18.5 (*)    All other components within normal limits  GLUCOSE, CAPILLARY - Abnormal; Notable for the following components:   Glucose-Capillary 227 (*)    All other components within normal limits  BASIC METABOLIC PANEL - Abnormal; Notable for the following components:    Glucose, Bld 265 (*)    Calcium 8.4 (*)    All other components within normal limits  CBC - Abnormal; Notable for the following components:   RBC 3.84 (*)    Hemoglobin 8.8 (*)    HCT 30.9 (*)    MCH 22.9 (*)    MCHC 28.5 (*)    RDW 18.6 (*)    All other components within normal limits  GLUCOSE, CAPILLARY - Abnormal; Notable for the following components:   Glucose-Capillary 222 (*)    All other components within normal limits  HEMOGLOBIN A1C - Abnormal; Notable for the following components:   Hgb A1c MFr Bld 6.6 (*)    All other components within normal limits  GLUCOSE, CAPILLARY - Abnormal; Notable for the following components:   Glucose-Capillary 303 (*)    All other components within normal limits  BASIC METABOLIC PANEL - Abnormal; Notable for the following components:   Potassium 5.2 (*)    Glucose, Bld 129 (*)    BUN 33 (*)    Creatinine, Ser 1.43 (*)    Calcium 8.6 (*)    GFR calc non Af Amer 52 (*)    All other components within normal limits  CBC - Abnormal; Notable for the following components:   WBC 14.2 (*)    Hemoglobin 10.0 (*)    HCT 35.0 (*)    MCH 23.4 (*)    MCHC 28.6 (*)    RDW 18.7 (*)    All other components within normal limits  GLUCOSE, CAPILLARY - Abnormal; Notable for the following components:   Glucose-Capillary 172 (*)    All other components within normal limits  GLUCOSE, CAPILLARY - Abnormal; Notable for the following components:   Glucose-Capillary 143 (*)  All other components within normal limits  GLUCOSE, CAPILLARY - Abnormal; Notable for the following components:   Glucose-Capillary 127 (*)    All other components within normal limits  GLUCOSE, CAPILLARY - Abnormal; Notable for the following components:   Glucose-Capillary 126 (*)    All other components within normal limits  BASIC METABOLIC PANEL - Abnormal; Notable for the following components:   Potassium 6.1 (*)    Glucose, Bld 173 (*)    BUN 51 (*)    Creatinine, Ser 1.92 (*)     Calcium 8.7 (*)    GFR calc non Af Amer 36 (*)    GFR calc Af Amer 42 (*)    All other components within normal limits  BLOOD GAS, ARTERIAL - Abnormal; Notable for the following components:   pH, Arterial 7.33 (*)    Bicarbonate 17.9 (*)    Acid-base deficit 7.1 (*)    All other components within normal limits  GLUCOSE, CAPILLARY - Abnormal; Notable for the following components:   Glucose-Capillary 46 (*)    All other components within normal limits  GLUCOSE, CAPILLARY - Abnormal; Notable for the following components:   Glucose-Capillary 59 (*)    All other components within normal limits  GLUCOSE, CAPILLARY - Abnormal; Notable for the following components:   Glucose-Capillary 235 (*)    All other components within normal limits  GLUCOSE, CAPILLARY - Abnormal; Notable for the following components:   Glucose-Capillary 159 (*)    All other components within normal limits  GLUCOSE, CAPILLARY - Abnormal; Notable for the following components:   Glucose-Capillary 186 (*)    All other components within normal limits  GLUCOSE, CAPILLARY - Abnormal; Notable for the following components:   Glucose-Capillary 150 (*)    All other components within normal limits  BASIC METABOLIC PANEL - Abnormal; Notable for the following components:   Potassium 5.3 (*)    Glucose, Bld 140 (*)    BUN 64 (*)    Creatinine, Ser 2.09 (*)    Calcium 8.2 (*)    GFR calc non Af Amer 33 (*)    GFR calc Af Amer 38 (*)    All other components within normal limits  CBC - Abnormal; Notable for the following components:   WBC 11.1 (*)    RBC 3.98 (*)    Hemoglobin 9.2 (*)    HCT 31.0 (*)    MCV 77.9 (*)    MCH 23.1 (*)    MCHC 29.7 (*)    RDW 18.3 (*)    All other components within normal limits  COMPREHENSIVE METABOLIC PANEL - Abnormal; Notable for the following components:   Potassium 5.6 (*)    Glucose, Bld 245 (*)    BUN 55 (*)    Creatinine, Ser 2.01 (*)    Calcium 7.4 (*)    Total Protein 5.7 (*)     Albumin 2.8 (*)    ALT 68 (*)    GFR calc non Af Amer 35 (*)    GFR calc Af Amer 40 (*)    All other components within normal limits  CBC WITH DIFFERENTIAL/PLATELET - Abnormal; Notable for the following components:   RBC 3.84 (*)    Hemoglobin 8.9 (*)    HCT 30.3 (*)    MCV 78.9 (*)    MCH 23.2 (*)    MCHC 29.4 (*)    RDW 18.5 (*)    All other components within normal limits  TROPONIN I - Abnormal; Notable  for the following components:   Troponin I 0.04 (*)    All other components within normal limits  TROPONIN I - Abnormal; Notable for the following components:   Troponin I 0.04 (*)    All other components within normal limits  TROPONIN I - Abnormal; Notable for the following components:   Troponin I 0.05 (*)    All other components within normal limits  PROTIME-INR - Abnormal; Notable for the following components:   Prothrombin Time 31.0 (*)    All other components within normal limits  GLUCOSE, CAPILLARY - Abnormal; Notable for the following components:   Glucose-Capillary 230 (*)    All other components within normal limits  RENAL FUNCTION PANEL - Abnormal; Notable for the following components:   Potassium 5.5 (*)    Glucose, Bld 186 (*)    BUN 61 (*)    Creatinine, Ser 2.11 (*)    Calcium 8.5 (*)    Phosphorus 6.0 (*)    Albumin 3.2 (*)    GFR calc non Af Amer 33 (*)    GFR calc Af Amer 38 (*)    All other components within normal limits  PHOSPHORUS - Abnormal; Notable for the following components:   Phosphorus 6.1 (*)    All other components within normal limits  PROTIME-INR - Abnormal; Notable for the following components:   Prothrombin Time 26.9 (*)    All other components within normal limits  LACTIC ACID, PLASMA - Abnormal; Notable for the following components:   Lactic Acid, Venous 2.1 (*)    All other components within normal limits  TROPONIN I - Abnormal; Notable for the following components:   Troponin I 0.05 (*)    All other components within  normal limits  GLUCOSE, CAPILLARY - Abnormal; Notable for the following components:   Glucose-Capillary 167 (*)    All other components within normal limits  GLUCOSE, CAPILLARY - Abnormal; Notable for the following components:   Glucose-Capillary 156 (*)    All other components within normal limits  GLUCOSE, CAPILLARY - Abnormal; Notable for the following components:   Glucose-Capillary 135 (*)    All other components within normal limits  GLUCOSE, CAPILLARY - Abnormal; Notable for the following components:   Glucose-Capillary 114 (*)    All other components within normal limits  CULTURE, BLOOD (ROUTINE X 2)  MRSA PCR SCREENING  CULTURE, BLOOD (ROUTINE X 2)  BLOOD GAS, VENOUS  GLUCOSE, CAPILLARY  GLUCOSE, CAPILLARY  GLUCOSE, CAPILLARY  GLUCOSE, CAPILLARY  BLOOD GAS, ARTERIAL  MAGNESIUM  BLOOD GAS, ARTERIAL  LACTIC ACID, PLASMA    Lab work reviewed by me with slight drop in hemoglobin otherwise unremarkable __________________________________________  EKG  ED ECG REPORT I, Darel Hong, the attending physician, personally viewed and interpreted this ECG.  Date: 05/19/2018 EKG Time: 0622 Rate: 118 Rhythm: Sinus tachycardia QRS Axis: normal Intervals: normal ST/T Wave abnormalities: ST sagging in leads I and aVL along with T wave inversion in V5 and V6.  Slight ST elevation in aVR as well as lead III.  This is unchanged when compared to previous EKGs from earlier last month.  No signs of acute ischemia Narrative Interpretation: no evidence of acute ischemia  ____________________________________________  RADIOLOGY  Chest x-ray reviewed by me consistent with acute pulmonary edema ____________________________________________   PROCEDURES  Procedure(s) performed: no  .Critical Care Performed by: Darel Hong, MD Authorized by: Darel Hong, MD   Critical care provider statement:    Critical care time (minutes):  35  Critical care time was exclusive of:   Separately billable procedures and treating other patients   Critical care was necessary to treat or prevent imminent or life-threatening deterioration of the following conditions:  Respiratory failure   Critical care was time spent personally by me on the following activities:  Development of treatment plan with patient or surrogate, discussions with consultants, evaluation of patient's response to treatment, examination of patient, obtaining history from patient or surrogate, ordering and performing treatments and interventions, ordering and review of laboratory studies, ordering and review of radiographic studies, pulse oximetry, re-evaluation of patient's condition and review of old charts    Critical Care performed: Yes  ____________________________________________   INITIAL IMPRESSION / ASSESSMENT AND PLAN / ED COURSE  Pertinent labs & imaging results that were available during my care of the patient were reviewed by me and considered in my medical decision making (see chart for details).   As part of my medical decision making, I reviewed the following data within the [place] History obtained from family if available, nursing notes, old chart and ekg, as well as notes from prior ED visits.  Patient comes to the emergency department quite uncomfortable appearing with respiratory rate in the high 20s.  He has a complex past medical history including both CHF and COPD however today he appears to have more fluid overload than bronchospasm.  He is only speaking in 3-4 word gasps and I asked him if he would feel more comfortable on BiPAP and he nodded vigorously.  After putting him on BiPAP he feels almost immediately improved and is asking if the lights to be turned down and to rest.  His chest x-ray is consistent with acute pulmonary edema.  He takes torsemide at baseline we will give him 80 mg of Lasix now and he will surely require inpatient admission.  His EKG is abnormal  however not a STEMI and is abnormal at baseline with just today from previous.  Care signed over to Dr. Quentin Cornwall who will follow up on labs, however the patient will have to be admitted regardless. Clinical Course as of May 23 934  Wed May 19, 2018  0716 Patient reassessed.  Breathing improved on BiPAP.  He denies any chest pain or pressure.  States his been compliant with his medications including Eliquis.  Doubt ACS.  More clinically consistent with volume overload.  Receiving Lasix at this moment.  Patient will require hospitalization.   [PR]    Clinical Course User Index [PR] Merlyn Lot, MD     ____________________________________________   FINAL CLINICAL IMPRESSION(S) / ED DIAGNOSES  Final diagnoses:  Acute on chronic combined systolic and diastolic congestive heart failure ([place])      NEW MEDICATIONS STARTED DURING THIS VISIT:  Current Discharge Medication List       Note:  This document was prepared using Dragon voice recognition software and may include unintentional dictation errors.     Darel Hong, MD 05/19/18 [id number]    Darel Hong, MD 05/23/18 9675    Darel Hong, MD 05/23/18 [phone]

## 2018-05-19 NOTE — H&P (Signed)
[place] [facility] [place] NAME: Marcus Williams    MR#:  [id number]  DATE OF BIRTH:  1955-09-08  DATE OF ADMISSION:  05/19/2018  PRIMARY CARE PHYSICIAN: Idelle Crouch, MD   REQUESTING/REFERRING PHYSICIAN: Darel Hong, MD  CHIEF COMPLAINT:   Chief Complaint  Patient presents with  . Shortness of Breath   HISTORY OF PRESENT ILLNESS:  Marcus Williams  is a 62 y.o. male with a known history of CHF, recent aortic valve replacement at [facility] is being admitted for worsening SOB started few hours before coming to ED.  He has intermittent atrial fibrillation and reports compliance with his Eliquis.  His symptoms are worse with exertion and only minimally improved with rest.  Sleeps on 2 pillows. Initially needed BiPAP in ED and requested step-down bed but no beds available. Was diuresed in ED and now on [place] O2. PAST MEDICAL HISTORY:   Past Medical History:  Diagnosis Date  . A-fib ([place])   . Aortic stenosis, moderate 11/2015  . Arthritis   . CAD (coronary artery disease)   . Chronic systolic CHF (congestive heart failure) ([place])   . COPD (chronic obstructive pulmonary disease) ([place])   . Diabetes mellitus without complication ([place])    INSULIN DEPENDENT  . GERD (gastroesophageal reflux disease)   . History of cardioversion   . Hypertension   . MI (myocardial infarction) ([place])   . OSA on CPAP   . Sepsis ([place])   . Shortness of breath dyspnea   . Stroke ([place])   . SVT (supraventricular tachycardia) ([place])     PAST SURGICAL HISTORY:   Past Surgical History:  Procedure Laterality Date  . 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 BUNIONECTOMY    . CARDIAC CATHETERIZATION N/A 10/02/2015   Procedure: Coronary/Grafts Angiography;  Surgeon: Teodoro Spray, MD;  Location: [place] [facility];  Service: Cardiovascular;  Laterality: N/A;  . CARDIAC  CATHETERIZATION N/A 11/21/2015   Procedure: Right and Left Heart Cath;  Surgeon: Sherren Mocha, MD;  Location: [place] [facility];  Service: Cardiovascular;  Laterality: N/A;  . CORONARY ARTERY BYPASS GRAFT    . CORONARY STENT INTERVENTION N/A 07/30/2017   Procedure: CORONARY STENT INTERVENTION;  Surgeon: Isaias Cowman, MD;  Location: [place] [facility];  Service: Cardiovascular;  Laterality: N/A;  . CORONARY STENT INTERVENTION N/A 10/23/2017   Procedure: CORONARY STENT INTERVENTION;  Surgeon: Yolonda Kida, MD;  Location: [place] [facility];  Service: Cardiovascular;  Laterality: N/A;  . KNEE SURGERY    . LEFT HEART CATH AND CORONARY ANGIOGRAPHY N/A 10/30/2017   Procedure: LEFT HEART CATH AND CORONARY ANGIOGRAPHY;  Surgeon: Teodoro Spray, MD;  Location: [place] [facility];  Service: Cardiovascular;  Laterality: N/A;  . LEFT HEART CATH AND CORS/GRAFTS ANGIOGRAPHY N/A 10/22/2017   Procedure: LEFT HEART CATH AND CORS/GRAFTS ANGIOGRAPHY;  Surgeon: Corey Skains, MD;  Location: [place] [facility];  Service: Cardiovascular;  Laterality: N/A;  . quadruple bypass    . RIGHT/LEFT HEART CATH AND CORONARY/GRAFT ANGIOGRAPHY N/A 07/30/2017   Procedure: RIGHT/LEFT HEART CATH AND CORONARY/GRAFT ANGIOGRAPHY;  Surgeon: Isaias Cowman, MD;  Location: [place] [facility];  Service: Cardiovascular;  Laterality: N/A;  . TEE WITHOUT CARDIOVERSION  11/21/2015  . TEE WITHOUT CARDIOVERSION N/A 11/21/2015   Procedure: TRANSESOPHAGEAL ECHOCARDIOGRAM (TEE);  Surgeon: Larey Dresser, MD;  Location:  [facility];  Service: Cardiovascular;  Laterality: N/A;  . TRACHEOSTOMY      SOCIAL HISTORY:   Social History   Tobacco Use  . Smoking status: Never Smoker  . Smokeless tobacco: Never Used  Substance Use Topics  . Alcohol use: No    FAMILY HISTORY:   Family History  Problem Relation Age of Onset  . Stroke Mother   . Heart attack Mother   . Hypertension Mother   . Heart attack Father     . Hypertension Father   . Heart attack Brother        #1  . Diabetes Brother        #1  . Heart disease Brother        #2  . Lung disease Brother        #2  . Hypertension Brother        #2  . Diabetes Brother        #2    DRUG ALLERGIES:   Allergies  Allergen Reactions  . Contrast Media [Iodinated Diagnostic Agents] Shortness Of Breath    Hypoxia/hypotension  . Versed [Midazolam] Shortness Of Breath and Other (See Comments)    Hypotension    REVIEW OF SYSTEMS:   ROSunable to obtain as on BiPAP  MEDICATIONS AT HOME:   Prior to Admission medications   Medication Sig Start Date End Date Taking? Authorizing Provider  albuterol (PROVENTIL HFA;VENTOLIN HFA) 108 (90 Base) MCG/ACT inhaler Inhale 2 puffs into the lungs every 6 (six) hours as needed for wheezing or shortness of breath. 07/11/15   Flora Lipps, MD  apixaban (ELIQUIS) 5 MG TABS tablet Take 5 mg by mouth 2 (two) times daily.    [provider]  atorvastatin (LIPITOR) 40 MG tablet Take 1 tablet (40 mg total) by mouth daily at 6 PM. 10/24/17   Gouru, Aruna, MD  baclofen (LIORESAL) 10 MG tablet Take 10 mg by mouth 2 (two) times daily as needed for muscle spasms.     [provider]  chlorpheniramine-HYDROcodone (TUSSIONEX PENNKINETIC ER) 10-8 MG/5ML SUER Take 5 mLs by mouth every 12 (twelve) hours as needed. 05/06/18   Gregor Hams, MD  clopidogrel (PLAVIX) 75 MG tablet Take 1 tablet (75 mg total) by mouth daily with breakfast. 08/01/17   Max Sane, MD  diltiazem (CARDIZEM CD) 180 MG 24 hr capsule Take 180 mg by mouth 2 (two) times daily.    [provider]  DULoxetine (CYMBALTA) 20 MG capsule Take 20 mg by mouth daily.    [provider]  Fluticasone-Salmeterol (ADVAIR) 250-50 MCG/DOSE AEPB Inhale 1 puff into the lungs 2 (two) times daily. 11/30/17   Gladstone Lighter, MD  insulin NPH-regular Human (NOVOLIN 70/30) (70-30) 100 UNIT/ML injection Inject 85-100 Units into the skin 2  (two) times daily with a meal. 85 units in the morning and 100 units in the evening    [provider]  isosorbide mononitrate (IMDUR) 30 MG 24 hr tablet Take 1 tablet (30 mg total) by mouth daily. 12/06/17   Gladstone Lighter, MD  lisinopril (PRINIVIL,ZESTRIL) 10 MG tablet Take 1 tablet (10 mg total) by mouth daily. 08/14/17   Gladstone Lighter, MD  metFORMIN (GLUCOPHAGE) 1000 MG tablet Take 1,000 mg by mouth 2 (two) times daily with a meal.    [provider]  metoprolol (LOPRESSOR) 100 MG tablet Take 100 mg by mouth 2 (two) times daily. 10/15/15   [provider]  nitroGLYCERIN (NITROSTAT) 0.4 MG SL tablet Place  0.4 mg under the tongue every 5 (five) minutes as needed for chest pain.    [provider]  pantoprazole (PROTONIX) 40 MG tablet Take 1 tablet (40 mg total) by mouth 2 (two) times daily. 12/05/17   Gladstone Lighter, MD  potassium chloride 20 MEQ TBCR Take 20 mEq by mouth daily. 11/23/15   Shirley Friar, PA-C  pramipexole (MIRAPEX) 0.5 MG tablet Take 0.5 mg by mouth 3 (three) times daily as needed.     [provider]  torsemide (DEMADEX) 20 MG tablet Take 2 tablets (40 mg total) by mouth daily. 08/14/17   Gladstone Lighter, MD  zolpidem (AMBIEN) 5 MG tablet Take 1 tablet (5 mg total) by mouth at bedtime as needed for sleep. 11/01/17   Fritzi Mandes, MD      VITAL SIGNS:  Blood pressure 131/74, pulse (!) 111, temperature (!) 97.5 F (36.4 C), temperature source Oral, resp. rate (!) 36, height 5\' 7"  (1.702 m), weight 117.9 kg, SpO2 100 %.  PHYSICAL EXAMINATION:  Physical Exam  GENERAL:  62 y.o.-year-old patient lying in the bed with acute resp distress. BiPAP on EYES: Pupils equal, round, reactive to light and accommodation. No scleral icterus. Extraocular muscles intact.  HEENT: Head atraumatic, normocephalic. Oropharynx and nasopharynx clear.  NECK:  Supple, no jugular venous distention. No thyroid enlargement, no tenderness.  LUNGS:  Decreased breath sounds bilaterally, + wheezing, rales,No rhonchi or crepitation. Using accessory muscles of respiration.  CARDIOVASCULAR: S1, S2 normal. No murmurs, rubs, or gallops.  ABDOMEN: Soft, nontender, nondistended. Bowel sounds present. No organomegaly or mass.  EXTREMITIES: No pedal edema, cyanosis, or clubbing.  NEUROLOGIC: Cranial nerves II through XII are intact. Muscle strength 5/5 in all extremities. Sensation intact. Gait not checked.  PSYCHIATRIC: The patient is alert and oriented x 3.  SKIN: No obvious rash, lesion, or ulcer.  LABORATORY PANEL:   CBC Recent Labs  Lab 05/19/18 0625  WBC 5.5  HGB 9.3*  HCT 32.3*  PLT 175   ------------------------------------------------------------------------------------------------------------------  Chemistries  Recent Labs  Lab 05/19/18 0625  NA 141  K 4.5  CL 107  CO2 25  GLUCOSE 190*  BUN 13  CREATININE 0.66  CALCIUM 8.6*  AST 42*  ALT 141*  ALKPHOS 105  BILITOT 1.4*   ------------------------------------------------------------------------------------------------------------------  Cardiac Enzymes Recent Labs  Lab 05/19/18 0625  TROPONINI 0.03*   ------------------------------------------------------------------------------------------------------------------  RADIOLOGY:  Dg Chest Port 1 View  Result Date: 05/19/2018 CLINICAL DATA:  62 year old male with shortness of breath EXAM: PORTABLE CHEST 1 VIEW COMPARISON:  Head chest radiograph dated 05/06/2018 and CT dated 04/28/2018 FINDINGS: There is stable cardiomegaly. Mild bilateral perihilar vascular prominence, likely mild vascular congestion. Increased density at the left lung base likely combination of atelectasis and postsurgical changes related to left ventricular/aortic conduit seen on the CT. There is no pleural effusion or pneumothorax. Median sternotomy wires. No acute osseous pathology. IMPRESSION: Cardiomegaly with mild vascular congestion. Probable  atelectasis or postsurgical changes of the left lung base. Electronically Signed   By: Anner Crete M.D.   On: 05/19/2018 06:48   IMPRESSION AND PLAN:  32 y m with acute hypoxic resp failure  * Acute Hypoxic Resp failure - due to COPD/CHF exacerbation - off BiPAP now  * Acute on chronic systolic dysfunction congestive heart failure with pulmonary edema  - continue aggressive Diuresis - Cardio c/s, echo  * Elevated troponin - likely due to demand ischemia, monitor on tele  * Sinus tach: continue Metoprolol   All the  records are reviewed and case discussed with ED provider. Management plans discussed with the patient, nursing and they are in agreement.  CODE STATUS: FULL CODE  TOTAL TIME (Critical Care) TAKING CARE OF THIS PATIENT: 45 minutes.    Max Sane M.D on 05/19/2018 at 8:13 AM  Between 7am to 6pm - Pager - [phone]  After 6pm go to [email or web address] - Proofreader  [facility]  Office  [phone]  CC: Primary care physician; Idelle Crouch, MD   Note: This dictation was prepared with Dragon dictation along with smaller phrase technology. Any transcriptional errors that result from this process are unintentional.

## 2018-05-19 NOTE — Consult Note (Signed)
[facility] Consultation Note  Patient ID: Marcus Williams, MRN: [id number], DOB/AGE: [birth year] 62 y.o. Admit date: 05/19/2018   Date of Consult: 05/19/2018 Primary Physician: Idelle Crouch, MD Primary Cardiologist: Ubaldo Glassing  Chief Complaint:  Chief Complaint  Patient presents with  . Shortness of Breath   Reason for Consult: Heart failure  HPI: 62 y.o. male with known coronary disease test was coronary to bypass graft and stent to the right coronary artery graft and known LV systolic dysfunction with ejection fraction of 35%.  The patient has been on appropriate medication management for cardiovascular disease but recently has been taking care of his wife and has had some dietary indiscretion and some other work.  There is some concerns that the patient is not taking all of his pills.  He has had worsening shortness of breath lower extremity edema pulmonary edema and hypoxia.  When seen in the emergency room he had an EKG showing normal sinus rhythm with septal and inferior myocardial infarction with left intraventricular conduction defect.  The patient does have a has a chest x-ray showing [place] edema a BNP of 1939 and a troponin of 0.03 most consistent with demand ischemia rather than acute coronary syndrome.  The patient received intravenous Lasix with significant improvements of symptoms at this time and no chest pain.  He is hemodynamically stable at this time  Past Medical History:  Diagnosis Date  . A-fib ([place])   . Aortic stenosis, moderate 11/2015  . Arthritis   . CAD (coronary artery disease)   . Chronic systolic CHF (congestive heart failure) ([place])   . COPD (chronic obstructive pulmonary disease) ([place])   . Diabetes mellitus without complication ([place])    INSULIN DEPENDENT  . GERD (gastroesophageal reflux disease)   . History of cardioversion   . Hypertension   . MI (myocardial infarction) ([place])   . OSA on CPAP   . Sepsis ([place])   . Shortness of breath dyspnea    . Stroke ([place])   . SVT (supraventricular tachycardia) [facility])       Surgical History:  Past Surgical History:  Procedure Laterality Date  . 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 BUNIONECTOMY    . CARDIAC CATHETERIZATION N/A 10/02/2015   Procedure: Coronary/Grafts Angiography;  Surgeon: Teodoro Spray, MD;  Location: [place] [facility];  Service: Cardiovascular;  Laterality: N/A;  . CARDIAC CATHETERIZATION N/A 11/21/2015   Procedure: Right and Left Heart Cath;  Surgeon: Sherren Mocha, MD;  Location: [place] [facility];  Service: Cardiovascular;  Laterality: N/A;  . CORONARY ARTERY BYPASS GRAFT    . CORONARY STENT INTERVENTION N/A 07/30/2017   Procedure: CORONARY STENT INTERVENTION;  Surgeon: Isaias Cowman, MD;  Location: [place] [facility];  Service: Cardiovascular;  Laterality: N/A;  . CORONARY STENT INTERVENTION N/A 10/23/2017   Procedure: CORONARY STENT INTERVENTION;  Surgeon: Yolonda Kida, MD;  Location: [place] [facility];  Service: Cardiovascular;  Laterality: N/A;  . KNEE SURGERY    . LEFT HEART CATH AND CORONARY ANGIOGRAPHY N/A 10/30/2017   Procedure: LEFT HEART CATH AND CORONARY ANGIOGRAPHY;  Surgeon: Teodoro Spray, MD;  Location: [place] [facility];  Service: Cardiovascular;  Laterality: N/A;  . LEFT HEART CATH AND CORS/GRAFTS ANGIOGRAPHY N/A 10/22/2017   Procedure: LEFT HEART CATH AND CORS/GRAFTS ANGIOGRAPHY;  Surgeon: Corey Skains, MD;  Location: [place] [facility];  Service: Cardiovascular;  Laterality: N/A;  . quadruple bypass    . RIGHT/LEFT HEART CATH AND CORONARY/GRAFT ANGIOGRAPHY N/A 07/30/2017   Procedure: RIGHT/LEFT HEART CATH AND CORONARY/GRAFT ANGIOGRAPHY;  Surgeon: Isaias Cowman, MD;  Location: [place] [facility];  Service: Cardiovascular;  Laterality: N/A;  . TEE WITHOUT CARDIOVERSION  11/21/2015  . TEE WITHOUT CARDIOVERSION  N/A 11/21/2015   Procedure: TRANSESOPHAGEAL ECHOCARDIOGRAM (TEE);  Surgeon: Larey Dresser, MD;  Location: [place];  Service: Cardiovascular;  Laterality: N/A;  . TRACHEOSTOMY       Home Meds: Prior to Admission medications   Medication Sig Start Date End Date Taking? Authorizing Provider  albuterol (PROVENTIL HFA;VENTOLIN HFA) 108 (90 Base) MCG/ACT inhaler Inhale 2 puffs into the lungs every 6 (six) hours as needed for wheezing or shortness of breath. 07/11/15  Yes Kasa, Maretta Bees, MD  apixaban (ELIQUIS) 5 MG TABS tablet Take 5 mg by mouth 2 (two) times daily.   Yes [provider]  atorvastatin (LIPITOR) 40 MG tablet Take 1 tablet (40 mg total) by mouth daily at 6 PM. 10/24/17  Yes Gouru, Aruna, MD  DULoxetine (CYMBALTA) 20 MG capsule Take 20 mg by mouth daily.   Yes [provider]  Fluticasone-Salmeterol (ADVAIR) 250-50 MCG/DOSE AEPB Inhale 1 puff into the lungs 2 (two) times daily. 11/30/17  Yes Gladstone Lighter, MD  insulin NPH-regular Human (NOVOLIN 70/30) (70-30) 100 UNIT/ML injection Inject 85-100 Units into the skin 2 (two) times daily with a meal. 85 units in the morning and 100 units in the evening. Patient gets this but has not been taking for a couple months   Yes [provider]  isosorbide mononitrate (IMDUR) 30 MG 24 hr tablet Take 1 tablet (30 mg total) by mouth daily. 12/06/17  Yes Gladstone Lighter, MD  lisinopril (PRINIVIL,ZESTRIL) 10 MG tablet Take 1 tablet (10 mg total) by mouth daily. 08/14/17  Yes Gladstone Lighter, MD  metFORMIN (GLUCOPHAGE) 1000 MG tablet Take 1,000 mg by mouth 2 (two) times daily with a meal.   Yes [provider]  metoprolol (LOPRESSOR) 100 MG tablet Take 100 mg by mouth 2 (two) times daily. 10/15/15  Yes [provider]  pantoprazole (PROTONIX) 40 MG tablet Take 1 tablet (40 mg total) by mouth 2 (two) times daily. 12/05/17  Yes Gladstone Lighter, MD  potassium chloride 20 MEQ TBCR Take 20 mEq by mouth daily.  11/23/15  Yes Shirley Friar, PA-C  torsemide (DEMADEX) 20 MG tablet Take 2 tablets (40 mg total) by mouth daily. 08/14/17  Yes Gladstone Lighter, MD  zolpidem (AMBIEN) 5 MG tablet Take 1 tablet (5 mg total) by mouth at bedtime as needed for sleep. 11/01/17  Yes Fritzi Mandes, MD  baclofen (LIORESAL) 10 MG tablet Take 10 mg by mouth 2 (two) times daily as needed for muscle spasms.     [provider]  chlorpheniramine-HYDROcodone (TUSSIONEX PENNKINETIC ER) 10-8 MG/5ML SUER Take 5 mLs by mouth every 12 (twelve) hours as needed. Patient not taking: Reported on 05/19/2018 05/06/18   Gregor Hams, MD  clopidogrel (PLAVIX) 75 MG tablet Take 1 tablet (75 mg total) by mouth daily with breakfast. Patient not taking: Reported on 05/19/2018 08/01/17   Max Sane, MD  diltiazem (CARDIZEM CD) 180 MG 24 hr capsule Take 180 mg by mouth 2 (two) times daily.    [provider]  nitroGLYCERIN (NITROSTAT) 0.4 MG SL tablet Place 0.4 mg under the tongue every 5 (five) minutes as needed for chest pain.  [provider]  pramipexole (MIRAPEX) 0.5 MG tablet Take 0.5 mg by mouth 3 (three) times daily as needed.     [provider]    Inpatient Medications:  . apixaban  5 mg Oral BID  . atorvastatin  40 mg Oral q1800  . clopidogrel  75 mg Oral Q breakfast  . diltiazem  180 mg Oral BID  . DULoxetine  20 mg Oral Daily  . furosemide  40 mg Intravenous BID  . isosorbide mononitrate  30 mg Oral Daily  . lisinopril  10 mg Oral Daily  . metFORMIN  1,000 mg Oral BID WC  . metoprolol tartrate  100 mg Oral BID  . mometasone-formoterol  2 puff Inhalation BID  . pantoprazole  40 mg Oral BID  . potassium chloride SA  20 mEq Oral Daily     Allergies:  Allergies  Allergen Reactions  . Contrast Media [Iodinated Diagnostic Agents] Shortness Of Breath    Hypoxia/hypotension  . Versed [Midazolam] Shortness Of Breath and Other (See Comments)    Hypotension    Social History    Socioeconomic History  . Marital status: Married    Spouse name: Not on file  . Number of children: Not on file  . Years of education: Not on file  . Highest education level: Not on file  Occupational History  . Not on file  Social Needs  . Financial resource strain: Not on file  . Food insecurity:    Worry: Not on file    Inability: Not on file  . Transportation needs:    Medical: Not on file    Non-medical: Not on file  Tobacco Use  . Smoking status: Never Smoker  . Smokeless tobacco: Never Used  Substance and Sexual Activity  . Alcohol use: No  . Drug use: No  . Sexual activity: Yes  Lifestyle  . Physical activity:    Days per week: Not on file    Minutes per session: Not on file  . Stress: Not on file  Relationships  . Social connections:    Talks on phone: Not on file    Gets together: Not on file    Attends religious service: Not on file    Active member of club or organization: Not on file    Attends meetings of clubs or organizations: Not on file    Relationship status: Not on file  . Intimate partner violence:    Fear of current or ex partner: Not on file    Emotionally abused: Not on file    Physically abused: Not on file    Forced sexual activity: Not on file  Other Topics Concern  . Not on file  Social History Narrative  . Not on file     Family History  Problem Relation Age of Onset  . Stroke Mother   . Heart attack Mother   . Hypertension Mother   . Heart attack Father   . Hypertension Father   . Heart attack Brother        #1  . Diabetes Brother        #1  . Heart disease Brother        #2  . Lung disease Brother        #2  . Hypertension Brother        #2  . Diabetes Brother        #2     Review of Systems Positive for shortness of breath cough congestion  PND orthopnea Negative for: General:  chills, fever, night sweats or weight changes.  Cardiovascular: Positive for PND orthopnea negative for syncope dizziness   Dermatological skin lesions rashes Respiratory: Cough congestion Urologic: Frequent urination urination at night and hematuria Abdominal: negative for nausea, vomiting, diarrhea, bright red blood per rectum, melena, or hematemesis Neurologic: negative for visual changes, and/or hearing changes  All other systems reviewed and are otherwise negative except as noted above.  Labs: Recent Labs    05/19/18 0625  TROPONINI 0.03*   Lab Results  Component Value Date   WBC 5.5 05/19/2018   HGB 9.3 (L) 05/19/2018   HCT 32.3 (L) 05/19/2018   MCV 80.3 05/19/2018   PLT 175 05/19/2018    Recent Labs  Lab 05/19/18 0625  NA 141  K 4.5  CL 107  CO2 25  BUN 13  CREATININE 0.66  CALCIUM 8.6*  PROT 7.3  BILITOT 1.4*  ALKPHOS 105  ALT 141*  AST 42*  GLUCOSE 190*   Lab Results  Component Value Date   CHOL 136 10/29/2017   HDL 34 (L) 10/29/2017   LDLCALC 81 10/29/2017   TRIG 104 10/29/2017   No results found for: DDIMER  Radiology/Studies:  Dg Chest 2 View  Result Date: 05/06/2018 CLINICAL DATA:  Shortness of breath. EXAM: CHEST - 2 VIEW COMPARISON:  CT 04/28/2018. chest x-ray 04/28/2018, 12/03/2017. FINDINGS: Prior CABG. Stable fracture of upper median sternotomy wire. Cardiomegaly again noted. Aortic contour, this is best identified by prior CT. Moderate bilateral pulmonary infiltrates/edema. Atelectatic changes left lung base. No pneumothorax. Previously identified rib fractures best identified by prior CT. IMPRESSION: 1. Prior CABG. Aortic conduit, this is best identified by prior CT. Cardiomegaly again noted. Moderate bilateral pulmonary infiltrates/edema. A component of CHF may be present. 2.  Atelectatic changes left lung base. Electronically Signed   By: [place]   On: 05/06/2018 06:30   Dg Chest 2 View  Result Date: 04/28/2018 CLINICAL DATA:  Shortness of breath. EXAM: CHEST - 2 VIEW COMPARISON:  Radiograph of December 03, 2017. FINDINGS: Stable cardiomegaly. Status  post coronary artery bypass graft. No pneumothorax is noted. New left perihilar and basilar opacities are noted concerning for edema or infiltrate. Small left pleural effusion cannot be excluded. Right midlung subsegmental atelectasis or scarring is noted. Bony thorax is unremarkable. IMPRESSION: New left perihilar and basilar opacities are noted concerning for edema or infiltrate. Small left pleural effusion cannot be excluded. Electronically Signed   By: Marijo Conception, M.D.   On: 04/28/2018 13:13   Ct Chest Wo Contrast  Result Date: 04/28/2018 CLINICAL DATA:  Increasing shortness of breath since a fall left night onto his left side. Abnormal chest radiograph. EXAM: CT CHEST WITHOUT CONTRAST TECHNIQUE: Multidetector CT imaging of the chest was performed following the standard protocol without IV contrast. COMPARISON:  Chest x-rays dated 04/28/2018 and 12/03/2017 and chest CT 03/24/2017 FINDINGS: Cardiovascular: Cardiomegaly. Left ventricular apical-aortic conduit in place with the valve in the conduit. The conduit extends from the left ventricular apex to the descending thoracic aorta. Extensive coronary artery calcification. Extensive aortic valvular calcification. Aortic atherosclerosis. CABG. No pericardial effusion. Pulmonary vascularity appears normal. Mediastinum/Nodes: No enlarged mediastinal or axillary lymph nodes. Thyroid gland, trachea, and esophagus demonstrate no significant findings. Lungs/Pleura: There is a small amount of fluid loculated in the superior aspect of left major fissure. Chronic pleural thickening and slight atelectasis at the left lung base. No pneumothorax. Upper Abdomen: No acute abnormality. Musculoskeletal: There are multiple old  left rib fractures. There is a subacute fracture of the posterolateral aspect of the left fifth rib. There are fractures of anterior costal cartilages of the left third, fourth, fifth and sixth ribs. There is extensive ill-defined soft tissue  stranding in the subcutaneous fat of the lateral aspect of the left side of the chest. This may be due to the prior chest surgery but I cannot exclude cellulitis. No definable hematoma or abscess. This could also represent chest wall contusion from the patient's recent fall. IMPRESSION: 1. Soft tissue stranding in the subcutaneous fat of the left lateral chest wall which could represent soft tissue contusion or cellulitis or postsurgical changes from recent chest surgery. 2. Multiple old and subacute left rib fractures. 3. Fractures of the anterior costal cartilages of multiple left ribs. 4. Left ventricular apex to descending thoracic aorta conduit with valve. Electronically Signed   By: Lorriane Shire M.D.   On: 04/28/2018 14:40   Dg Chest Port 1 View  Result Date: 05/19/2018 CLINICAL DATA:  62 year old male with shortness of breath EXAM: PORTABLE CHEST 1 VIEW COMPARISON:  Head chest radiograph dated 05/06/2018 and CT dated 04/28/2018 FINDINGS: There is stable cardiomegaly. Mild bilateral perihilar vascular prominence, likely mild vascular congestion. Increased density at the left lung base likely combination of atelectasis and postsurgical changes related to left ventricular/aortic conduit seen on the CT. There is no pleural effusion or pneumothorax. Median sternotomy wires. No acute osseous pathology. IMPRESSION: Cardiomegaly with mild vascular congestion. Probable atelectasis or postsurgical changes of the left lung base. Electronically Signed   By: Anner Crete M.D.   On: 05/19/2018 06:48    EKG: Normal sinus rhythm with septal and inferior infarct age undetermined with left interventricular conduction defect  Weights: Filed Weights   05/19/18 0627  Weight: 117.9 kg     Physical Exam: Blood pressure 125/74, pulse 88, temperature (!) 97.5 F (36.4 C), temperature source Oral, resp. rate (!) 21, height 5\' 7"  (1.702 m), weight 117.9 kg, SpO2 100 %. Body mass index is 40.72 kg/m. General:  Well developed, well nourished, in no acute distress. Head eyes ears nose throat: Normocephalic, atraumatic, sclera non-icteric, no xanthomas, nares are without discharge. No apparent thyromegaly and/or mass  Lungs: Normal respiratory effort.  no wheezes, basilar rales, no rhonchi.  Heart: Irregular with normal S1 S2.  2+ upper sternal border murmur gallop, no rub, PMI is normal size and placement, carotid upstroke normal without bruit, jugular venous pressure is normal Abdomen: Soft, non-tender, non-distended with normoactive bowel sounds. No hepatomegaly. No rebound/guarding. No obvious abdominal masses. Abdominal aorta is normal size without bruit Extremities: Trace to 1+ edema. no cyanosis, no clubbing, no ulcers  Peripheral : 2+ bilateral upper extremity pulses, 2+ bilateral femoral pulses, 2+ bilateral dorsal pedal pulse Neuro: Alert and oriented. No facial asymmetry. No focal deficit. Moves all extremities spontaneously. Musculoskeletal: Normal muscle tone without kyphosis Psych:  Responds to questions appropriately with a normal affect.    Assessment: 62 year old male with acute on chronic systolic dysfunction congestive heart failure with pulmonary edema and no current evidence of myocardial infarction or angina likely secondary to dietary indiscretion and some noncompliance slightly improved at this time with medication management okay  Plan: 1.  Continue intravenous Lasix for pulmonary edema and acute on chronic systolic dysfunction heart failure 2.  No further cardiac diagnostics necessary at this time due to no evidence of myocardial infarction 3.  Reinstatement of all appropriate medication management for acute on chronic systolic dysfunction heart failure  including ACE inhibitor and beta-blocker 4.  High intensity cholesterol therapy for coronary artery disease 5.  Dietary consultation is for further assessment and treatment and reduction of readmissions  Signed, Corey Skains M.D. [facility] 05/19/2018, 2:27 PM

## 2018-05-19 NOTE — ED Notes (Signed)
Pt weaned from [facility] to 2 LNC.

## 2018-05-19 NOTE — ED Notes (Signed)
ED TO INPATIENT HANDOFF REPORT  Name/Age/Gender Marcus Williams 62 y.o. male  Code Status Code Status History    Date Active Date Inactive Code Status Order ID Comments User Context   12/03/2017 2125 12/05/2017 1529 Full Code [id number]  Hillary Bow, MD ED   11/29/2017 1422 11/30/2017 1853 Full Code [id number]  [name], MD ED   10/28/2017 1958 11/01/2017 1658 Full Code [id number]  Salary, Avel Peace, MD ED   10/22/2017 0551 10/24/2017 1801 Full Code [id number]  Arta Silence, MD ED   08/10/2017 2205 08/14/2017 1512 Full Code [id number]  Vaughan Basta, MD Inpatient   07/31/2017 2045 08/02/2017 1648 Full Code [id number]  Henreitta Leber, MD Inpatient   07/28/2017 2230 07/31/2017 1428 Full Code [id number]  Dustin Flock, MD Inpatient   03/01/2017 0203 03/01/2017 2004 Full Code [id number]  Hugelmeyer, Alexis, DO Inpatient   11/21/2015 1620 11/23/2015 1710 Full Code [id number]  Sherren Mocha, MD Inpatient   05/06/2015 1545 [id number] 1609 Full Code [id number]  Sharlotte Alamo, MD Inpatient   05/04/2015 1951 05/06/2015 1545 Full Code [id number]  Vaughan Basta, MD ED   01/14/2015 1416 01/15/2015 1514 Full Code [id number]  Lance Coon, MD Inpatient   12/22/2014 1825 12/23/2014 1612 Full Code [id number]  Epifanio Lesches, MD ED   10/14/2014 1214 10/15/2014 1350 Full Code [id number]  Alfonso Patten, RN Inpatient    Advance Directive Documentation     Most Recent Value  Type of Advance Directive  Healthcare Power of Attorney  Pre-existing out of facility DNR order (yellow form or pink MOST form)  -  "MOST" Form in Place?  -      Home/SNF/Other home  Chief Complaint [facility] - Shortness of breath  Level of Care/Admitting Diagnosis ED Disposition    ED Disposition Condition [place]: [place] [[id number]]  Level of Care: Telemetry [5]  Diagnosis: Acute respiratory failure with hypoxemia [facility]) [[id number]]  Admitting Physician: Max Sane [[id number]]  Attending  Physician: Max Sane [[id number]]  Estimated length of stay: past midnight tomorrow  Certification:: I certify this patient will need inpatient services for at least 2 midnights  PT Class (Do Not Modify): Inpatient [101]  PT Acc Code (Do Not Modify): Private [1]       Medical History Past Medical History:  Diagnosis Date  . A-fib ([place])   . Aortic stenosis, moderate 11/2015  . Arthritis   . CAD (coronary artery disease)   . Chronic systolic CHF (congestive heart failure) ([place])   . COPD (chronic obstructive pulmonary disease) ([place])   . Diabetes mellitus without complication ([place])    INSULIN DEPENDENT  . GERD (gastroesophageal reflux disease)   . History of cardioversion   . Hypertension   . MI (myocardial infarction) ([place])   . OSA on CPAP   . Sepsis ([place])   . Shortness of breath dyspnea   . Stroke ([place])   . SVT (supraventricular tachycardia) (H[facility])     Allergies Allergies  Allergen Reactions  . Contrast Media [Iodinated Diagnostic Agents] Shortness Of Breath    Hypoxia/hypotension  . Versed [Midazolam] Shortness Of Breath and Other (See Comments)    Hypotension    IV Location/Drains/Wounds Patient Lines/Drains/Airways Status   Active Line/Drains/Airways    Name:   Placement date:   Placement time:   Site:   Days:   Peripheral IV 05/19/18 Left Hand   05/19/18    -    Hand   less than 1   Wound /  Incision (Open or Dehisced) 12/04/17   12/04/17    0003    -   166          Labs/Imaging Results for orders placed or performed during the hospital encounter of 05/19/18 (from the past 48 hour(s))  Blood gas, venous     Status: None   Collection Time: 05/19/18  6:25 AM  Result Value Ref Range   FIO2 0.21    pH, Ven 7.38 7.250 - 7.430   pCO2, Ven 45 44.0 - 60.0 mmHg   pO2, Ven 39.0 32.0 - 45.0 mmHg   Bicarbonate 26.6 20.0 - 28.0 mmol/L   Acid-Base Excess 1.0 0.0 - 2.0 mmol/L   O2 Saturation 72.3 %   Patient temperature 37.0    Collection site VENOUS    Sample type  VENOUS     Comment: Performed at [facility], [address] [place], [place] [address]  Comprehensive metabolic panel     Status: Abnormal   Collection Time: 05/19/18  6:25 AM  Result Value Ref Range   Sodium 141 135 - 145 mmol/L   Potassium 4.5 3.5 - 5.1 mmol/L    Comment: HEMOLYSIS AT THIS LEVEL MAY AFFECT RESULT   Chloride 107 98 - 111 mmol/L   CO2 25 22 - 32 mmol/L   Glucose, Bld 190 (H) 70 - 99 mg/dL   BUN 13 8 - 23 mg/dL   Creatinine, Ser 0.66 0.61 - 1.24 mg/dL   Calcium 8.6 (L) 8.9 - 10.3 mg/dL   Total Protein 7.3 6.5 - 8.1 g/dL   Albumin 3.2 (L) 3.5 - 5.0 g/dL   AST 42 (H) 15 - 41 U/L   ALT 141 (H) 0 - 44 U/L   Alkaline Phosphatase 105 38 - 126 U/L   Total Bilirubin 1.4 (H) 0.3 - 1.2 mg/dL   GFR calc non Af Amer >60 >60 mL/min   GFR calc Af Amer >60 >60 mL/min   Anion gap 9 5 - 15    Comment: Performed at [facility], [address] [place], [place] [address]  Brain natriuretic peptide     Status: Abnormal   Collection Time: 05/19/18  6:25 AM  Result Value Ref Range   B Natriuretic Peptide 1,939.0 (H) 0.0 - 100.0 pg/mL    Comment: Performed at [facility], [address] [place], [place] [address]  Troponin I - Once     Status: Abnormal   Collection Time: 05/19/18  6:25 AM  Result Value Ref Range   Troponin I 0.03 (HH) <0.03 ng/mL    Comment: CRITICAL RESULT CALLED TO, READ BACK BY AND VERIFIED WITH DR Quentin Cornwall AT 0725 ON 05/19/18 JJB Performed at [facility], [address] [place], [place] [address]   CBC with Differential     Status: Abnormal   Collection Time: 05/19/18  6:25 AM  Result Value Ref Range   WBC 5.5 4.0 - 10.5 K/uL   RBC 4.02 (L) 4.22 - 5.81 MIL/uL   Hemoglobin 9.3 (L) 13.0 - 17.0 g/dL   HCT 32.3 (L) 39.0 - 52.0 %   MCV 80.3 80.0 - 100.0 fL   MCH 23.1 (L) 26.0 - 34.0 pg   MCHC 28.8 (L) 30.0 - 36.0 g/dL   RDW 18.5 (H) 11.5 - 15.5 %   Platelets 175 150 - 400 K/uL   nRBC 0.0 0.0 - 0.2 %    Neutrophils Relative % 65 %   Neutro Abs 3.6 1.7 - 7.7 K/uL  Lymphocytes Relative 18 %   Lymphs Abs 1.0 0.7 - 4.0 K/uL   Monocytes Relative 10 %   Monocytes Absolute 0.6 0.1 - 1.0 K/uL   Eosinophils Relative 5 %   Eosinophils Absolute 0.3 0.0 - 0.5 K/uL   Basophils Relative 1 %   Basophils Absolute 0.1 0.0 - 0.1 K/uL   Immature Granulocytes 1 %   Abs Immature Granulocytes 0.03 0.00 - 0.07 K/uL    Comment: Performed at [facility], [address] [place], [place] [address]  Glucose, capillary     Status: Abnormal   Collection Time: 05/19/18  9:37 AM  Result Value Ref Range   Glucose-Capillary 227 (H) 70 - 99 mg/dL   Dg Chest Port 1 View  Result Date: 05/19/2018 CLINICAL DATA:  62 year old male with shortness of breath EXAM: PORTABLE CHEST 1 VIEW COMPARISON:  Head chest radiograph dated 05/06/2018 and CT dated 04/28/2018 FINDINGS: There is stable cardiomegaly. Mild bilateral perihilar vascular prominence, likely mild vascular congestion. Increased density at the left lung base likely combination of atelectasis and postsurgical changes related to left ventricular/aortic conduit seen on the CT. There is no pleural effusion or pneumothorax. Median sternotomy wires. No acute osseous pathology. IMPRESSION: Cardiomegaly with mild vascular congestion. Probable atelectasis or postsurgical changes of the left lung base. Electronically Signed   By: Anner Crete M.D.   On: 05/19/2018 06:48    Pending Labs Unresulted Labs (From admission, onward)    Start     Ordered   05/20/18 6045  Basic metabolic panel  Daily,   STAT     05/19/18 0806   05/20/18 0500  CBC  Tomorrow morning,   STAT     05/19/18 1719          Vitals/Pain Today's Vitals   05/19/18 1604 05/19/18 1605 05/19/18 1606 05/19/18 1607  BP:   99/60   Pulse: 64 65 66 64  Resp: 18 (!) 23 (!) 22 (!) 22  Temp:      TempSrc:      SpO2: 100% 97% 100% 100%  Weight:      Height:      PainSc:        Isolation  Precautions No active isolations  Medications Medications  metFORMIN (GLUCOPHAGE) tablet 1,000 mg (1,000 mg Oral Given 05/19/18 1735)  nitroGLYCERIN (NITROSTAT) SL tablet 0.4 mg (has no administration in time range)  metoprolol tartrate (LOPRESSOR) tablet 100 mg (100 mg Oral Given 05/19/18 0953)  potassium chloride SA (K-DUR,KLOR-CON) CR tablet 20 mEq (20 mEq Oral Given 05/19/18 0953)  clopidogrel (PLAVIX) tablet 75 mg (75 mg Oral Given 05/19/18 0953)  apixaban (ELIQUIS) tablet 5 mg (5 mg Oral Given 05/19/18 0954)  lisinopril (PRINIVIL,ZESTRIL) tablet 10 mg (10 mg Oral Given 05/19/18 0951)  diltiazem (CARDIZEM CD) 24 hr capsule 180 mg (180 mg Oral Given 05/19/18 0957)  DULoxetine (CYMBALTA) DR capsule 20 mg (20 mg Oral Given 05/19/18 0957)  atorvastatin (LIPITOR) tablet 40 mg (40 mg Oral Given 05/19/18 1735)  zolpidem (AMBIEN) tablet 5 mg (has no administration in time range)  mometasone-formoterol (DULERA) 200-5 MCG/ACT inhaler 2 puff (2 puffs Inhalation Given 05/19/18 0958)  isosorbide mononitrate (IMDUR) 24 hr tablet 30 mg (30 mg Oral Given 05/19/18 0956)  pantoprazole (PROTONIX) EC tablet 40 mg (40 mg Oral Given 05/19/18 0954)  furosemide (LASIX) injection 40 mg (40 mg Intravenous Given 05/19/18 1736)  albuterol (PROVENTIL) (2.5 MG/3ML) 0.083% nebulizer solution 2.5 mg (has no administration in time range)  ipratropium-albuterol (DUONEB) 0.5-2.5 (  3) MG/3ML nebulizer solution 3 mL (3 mLs Nebulization Given 05/19/18 0633)  ipratropium-albuterol (DUONEB) 0.5-2.5 (3) MG/3ML nebulizer solution 3 mL (3 mLs Nebulization Given 05/19/18 0871)  aspirin chewable tablet 324 mg (324 mg Oral Given 05/19/18 0632)  magnesium sulfate IVPB 2 g 50 mL (0 g Intravenous Stopped 05/19/18 0657)  furosemide (LASIX) injection 80 mg (80 mg Intravenous Given 05/19/18 0713)    Mobility oob with 1 assist

## 2018-05-19 NOTE — ED Triage Notes (Signed)
Pt arrived via [facility] from home with c/o [facility]. EMS states pt has been taking  Care of wife at home who has been in rehab and has exerted himself for the last couple of days. EMS states pt has hx of COPD. EMS gave 125 solu medrol en route, 2 nebs.

## 2018-05-20 DIAGNOSIS — L8990 Pressure ulcer of unspecified site, unspecified stage: Secondary | ICD-10-CM

## 2018-05-20 LAB — CBC
HCT: 30.9 % — ABNORMAL LOW (ref 39.0–52.0)
Hemoglobin: 8.8 g/dL — ABNORMAL LOW (ref 13.0–17.0)
MCH: 22.9 pg — ABNORMAL LOW (ref 26.0–34.0)
MCHC: 28.5 g/dL — ABNORMAL LOW (ref 30.0–36.0)
MCV: 80.5 fL (ref 80.0–100.0)
Platelets: 208 10*3/uL (ref 150–400)
RBC: 3.84 MIL/uL — AB (ref 4.22–5.81)
RDW: 18.6 % — ABNORMAL HIGH (ref 11.5–15.5)
WBC: 9.2 10*3/uL (ref 4.0–10.5)
nRBC: 0 % (ref 0.0–0.2)

## 2018-05-20 LAB — HEMOGLOBIN A1C
HEMOGLOBIN A1C: 6.6 % — AB (ref 4.8–5.6)
Mean Plasma Glucose: 142.72 mg/dL

## 2018-05-20 LAB — ECHOCARDIOGRAM COMPLETE
Height: 67 in
Weight: 4240 oz

## 2018-05-20 LAB — GLUCOSE, CAPILLARY
Glucose-Capillary: 222 mg/dL — ABNORMAL HIGH (ref 70–99)
Glucose-Capillary: 303 mg/dL — ABNORMAL HIGH (ref 70–99)
Glucose-Capillary: 172 mg/dL — ABNORMAL HIGH (ref 70–99)
Glucose-Capillary: 143 mg/dL — ABNORMAL HIGH (ref 70–99)

## 2018-05-20 LAB — BASIC METABOLIC PANEL
Anion gap: 10 (ref 5–15)
BUN: 21 mg/dL (ref 8–23)
CO2: 25 mmol/L (ref 22–32)
Calcium: 8.4 mg/dL — ABNORMAL LOW (ref 8.9–10.3)
Chloride: 103 mmol/L (ref 98–111)
Creatinine, Ser: 0.92 mg/dL (ref 0.61–1.24)
GFR calc Af Amer: >60 mL/min (ref >60)
GFR calc non Af Amer: >60 mL/min (ref >60)
Glucose, Bld: 265 mg/dL — ABNORMAL HIGH (ref 70–99)
Potassium: 4.9 mmol/L (ref 3.5–5.1)
Sodium: 138 mmol/L (ref 135–145)

## 2018-05-20 MED ORDER — HYDROCOD POLST-CPM POLST ER 10-8 MG/5ML PO SUER
5.0000 mL | Freq: Four times a day (QID) | ORAL | Status: DC | PRN
  Administered 2018-05-20 – 2018-05-31 (×10): 5 mL via ORAL
  Filled 2018-05-20 (×10): qty 5

## 2018-05-20 MED ORDER — INSULIN DETEMIR 100 UNIT/ML ~~LOC~~ SOLN
6.0000 [IU] | Freq: Every day | SUBCUTANEOUS | Status: DC
  Administered 2018-05-20 – 2018-05-21 (×2): 6 [IU] via SUBCUTANEOUS
  Filled 2018-05-20 (×4): qty 0.06

## 2018-05-20 MED ORDER — SALINE SPRAY 0.65 % NA SOLN
1.0000 | NASAL | Status: DC | PRN
  Administered 2018-05-21: 1 via NASAL
  Filled 2018-05-20: qty 44

## 2018-05-20 MED ORDER — INSULIN ASPART 100 UNIT/ML ~~LOC~~ SOLN
0.0000 [IU] | Freq: Three times a day (TID) | SUBCUTANEOUS | Status: DC
  Administered 2018-05-20: 7 [IU] via SUBCUTANEOUS
  Administered 2018-05-20: 2 [IU] via SUBCUTANEOUS
  Administered 2018-05-21 – 2018-05-22 (×3): 1 [IU] via SUBCUTANEOUS
  Administered 2018-05-22 (×2): 2 [IU] via SUBCUTANEOUS
  Filled 2018-05-20 (×7): qty 1

## 2018-05-20 MED ORDER — INSULIN ASPART 100 UNIT/ML ~~LOC~~ SOLN: 0.0000 [IU] | Freq: Every day | SUBCUTANEOUS | Status: DC

## 2018-05-20 NOTE — Plan of Care (Signed)
  Problem: Skin Integrity: Goal: Risk for impaired skin integrity will decrease Outcome: Not Progressing  No sacral foam at this time

## 2018-05-20 NOTE — Progress Notes (Signed)
[place] [facility] [place] NAME: Marcus Williams    MR#:  [id number]  DATE OF BIRTH:  [birth year]  SUBJECTIVE:  CHIEF COMPLAINT:   Chief Complaint  Patient presents with  . Shortness of Breath  Still quite short of breath and lower extremity swelling REVIEW OF SYSTEMS:  Review of Systems  Constitutional: Negative for chills, fever and weight loss.  HENT: Negative for nosebleeds and sore throat.   Eyes: Negative for blurred vision.  Respiratory: Positive for shortness of breath. Negative for cough and wheezing.   Cardiovascular: Positive for leg swelling. Negative for chest pain, orthopnea and PND.  Gastrointestinal: Negative for abdominal pain, constipation, diarrhea, heartburn, nausea and vomiting.  Genitourinary: Negative for dysuria and urgency.  Musculoskeletal: Negative for back pain.  Skin: Negative for rash.  Neurological: Negative for dizziness, speech change, focal weakness and headaches.  Endo/Heme/Allergies: Does not bruise/bleed easily.  Psychiatric/Behavioral: Negative for depression.    DRUG ALLERGIES:   Allergies  Allergen Reactions  . Contrast Media [Iodinated Diagnostic Agents] Shortness Of Breath    Hypoxia/hypotension  . Versed [Midazolam] Shortness Of Breath and Other (See Comments)    Hypotension   VITALS:  Blood pressure 109/81, pulse 64, temperature (!) 97.4 F (36.3 C), temperature source Oral, resp. rate 18, height 5\' 7"  (1.702 m), weight 120.9 kg, SpO2 99 %. PHYSICAL EXAMINATION:  Physical Exam  Constitutional: He is oriented to person, place, and time.  HENT:  Head: Normocephalic and atraumatic.  Eyes: Pupils are equal, round, and reactive to light. Conjunctivae and EOM are normal.  Neck: Normal range of motion. Neck supple. No tracheal deviation present. No thyromegaly present.  Cardiovascular: Normal rate, regular rhythm and normal heart sounds.  Pulmonary/Chest: Effort normal. No respiratory distress. He  has decreased breath sounds. He has no wheezes. He exhibits no tenderness.  Abdominal: Soft. Bowel sounds are normal. He exhibits no distension. There is no tenderness.  Musculoskeletal: Normal range of motion.       Right lower leg: He exhibits edema.       Left lower leg: He exhibits edema.  Neurological: He is alert and oriented to person, place, and time. No cranial nerve deficit.  Skin: Skin is warm and dry. No rash noted.  Has pressure ulcer on sacrum with dressing in place   LABORATORY PANEL:  Male CBC Recent Labs  Lab 05/20/18 0420  WBC 9.2  HGB 8.8*  HCT 30.9*  PLT 208   ------------------------------------------------------------------------------------------------------------------ Chemistries  Recent Labs  Lab 05/19/18 0625 05/20/18 0420  NA 141 138  K 4.5 4.9  CL 107 103  CO2 25 25  GLUCOSE 190* 265*  BUN 13 21  CREATININE 0.66 0.92  CALCIUM 8.6* 8.4*  AST 42*  --   ALT 141*  --   ALKPHOS 105  --   BILITOT 1.4*  --    RADIOLOGY:  No results found. ASSESSMENT AND PLAN:  74 y m with acute hypoxic resp failure  * Acute Hypoxic Resp failure - due to COPD/CHF exacerbation  * Acute on chronic systolic dysfunction congestive heart failure with pulmonary edema  - continue aggressive Diuresis - Appreciate Cardio input, echo not read yet  *Diabetes mellitus -Add insulin Lantus, continue sliding scale -Diabetic nurse consult  * Elevated troponin - likely due to demand ischemia, monitor on tele  * Sinus tach: continue Metoprolol  *Pressure ulcer - on sacrum with stage 2 pressure injury Per wound care nurse which I agree with  Measurement: .2X.2X.1cm Wound bed: dark red Drainage (amount, consistency, odor) no odor or drainage Periwound: intact skin surrounding Wound care nurse recommends Foam dressing to protect from further injury.  - left chest wound - Chronic full thickness wound with packing; mod amt green-tinged drainage, some odor.     .3X.3X3cm Surrounded by red macerated skin in the skin fold where it is frequently moist and rubs together; linear partial thickness fissure; .2X3X.1cm, red and moist, small amt tan drainage, no odor  Wound care nurse recommends - narrow strip of Aquacel and packed with a swab to absorb drainage and provide antimicrobial benefits.  Pt can resume previous plan of care with home health nursing after discharge.    All the records are reviewed and case discussed with Care Management/Social Worker. Management plans discussed with the patient, Nursing and they are in agreement.  CODE STATUS: Prior  TOTAL TIME TAKING CARE OF THIS PATIENT: 35 minutes.   More than 50% of the time was spent in counseling/coordination of care: YES  POSSIBLE D/C IN 1-2 DAYS, DEPENDING ON CLINICAL CONDITION.   Max Sane M.D on 05/20/2018 at 12:58 PM  Between 7am to 6pm - Pager - [phone]  After 6pm go to [email or web address] - Proofreader  [facility]  Hospitalists  Office  [phone]  CC: Primary care physician; Idelle Crouch, MD  Note: This dictation was prepared with Dragon dictation along with smaller phrase technology. Any transcriptional errors that result from this process are unintentional.

## 2018-05-20 NOTE — Consult Note (Addendum)
[place] Nurse wound consult note  Wound type: Sacrum with stage 2 pressure injury Pressure Injury POA: Yes Measurement: .2X.2X.1cm Wound bed: dark red Drainage (amount, consistency, odor) no odor or drainage Periwound: intact skin surrounding Dressing procedure/placement/frequency: Foam dressing to protect from further injury.  Reason for Consult: Consult requested for left chest wound; pt states he previously had a Vac dressing which was discontinued when wound improved.  Chronic full thickness wound with packing; mod amt green-tinged drainage, some odor.   .3X.3X3cm Surrounded by red macerated skin in the skin fold where it is frequently moist and rubs together; linear partial thickness fissure; .2X3X.1cm, red and moist, small amt tan drainage, no odor Plan: Applied narrow strip of Aquacel and packed with a swab to absorb drainage and provide antimicrobial benefits.  Pt can resume previous plan of care with home health nursing after discharge. Please re-consult if further assistance is needed.  Thank-you,  Julien Girt MSN, [place], [place], [place], [place]

## 2018-05-20 NOTE — Progress Notes (Addendum)
Inpatient Diabetes Program Recommendations  AACE/ADA: New Consensus Statement on Inpatient Glycemic Control (2019)  Target Ranges:  Prepandial:   less than 140 mg/dL      Peak postprandial:   less than 180 mg/dL (1-2 hours)      Critically ill patients:  140 - 180 mg/dL   Results for Marcus Williams, Marcus Williams (MRN [id number]) as of 05/20/2018 08:46  Ref. Range 05/19/2018 09:37 05/20/2018 08:11  Glucose-Capillary Latest Ref Range: 70 - 99 mg/dL 227 (H) 222 (H)   Review of Glycemic Control  Diabetes history: DM2 Outpatient Diabetes medications: 70/30 85 units QAM, 70/30 100 units QPM (has not used 70/30 in 2-3 months), Metformin 1000 mg BID Current orders for Inpatient glycemic control: Novolog 0-9 units TID with meals, Novolog 0-5 units QHS, Metformin 1000 mg BID  Inpatient Diabetes Program Recommendations:  Insulin - Basal: Please consider ordering Levemir 6 units Q24H. Insulin-Correction: Noted Novolog correction scale ordered this morning. HgbA1C: Last A1C in Care Everywhere was 6% on 04/12/18 indicating an average glucose of 125 mg/dl over the past 2-3 months.  Addendum 05/20/18@11 :00-Spoke with patient regarding DM control and outpatient DM regimen. Patient states that he has 70/30 insulin at home along with Metformin and another type of insulin (pen) that he was given when he was discharged from [facility] a few months ago. Patient states that he checks his glucose at home once a day and that it has been running fairly good (in the 100's).  Patient reports that he has not taken any 70/30 insulin in 2-3 months. Patient states that he has 3 insulin pens he got when he was discharged from [facility] the last time (does not know the name of them). Patient states he was never shown how to use an insulin pen so he has not used them at all. Reviewed and demonstrated how to use an insulin pen and patient was able to successfully demonstrate how to use an insulin pen.  Discussed last A1C of 6% on 04/12/18 and patient states  that he was not told what his last A1C was at his last PCP visit but was told his DM was doing well. Patient reports that over the past few months he has changed his eating habits and feels he does a much better job at following carb modified diet which is helping to keep his DM better controlled. Patient verbalized understanding of information discussed and states that he does not have any questions at this time. Reviewed chart and not about to access admission information from [facility] in Aug 2019. However, office note on 04/13/18 by Dr. Doy Hutching, patient was prescribed Lantus 15 units QHS, Regular 0-20 units QID (based on correction scale), and Metformin XR 1000 mg BID for DM control.   Thanks, Barnie Alderman, RN, MSN, CDE Diabetes Coordinator Inpatient Diabetes Program [phone] (Team Pager from 8am to 5pm)

## 2018-05-20 NOTE — Care Management Note (Signed)
Case Management Note  Patient Details  Name: Marcus Williams MRN: [id number] Date of Birth: 22-Jul-1955  Subjective/Objective:       Patient is from home with wife.  He has been admitted with CHF exacerbation.  Open to [facility] for home health RNand PT.  Will notify [facility] when patient discharges.  Has a functioning scale at home.  Has only been weighing 1x per week.  Verbalizes understanding of daily weights.  Current with PCP, DR. Sparks.  Denies difficulties obtaining medications or with medical care.  Plans to drive himself home at discharge.  No oxygen currently or at home.  He had a fall 3 weeks ago and has been using a walker at home since the as he hurt his ribs.  He is ambulating in room without difficulty.  No further needs identified at this time.               Action/Plan:   Expected Discharge Date:                  Expected Discharge Plan:  [place]  In-House Referral:     Discharge planning Services  CM Consult  Post Acute Care Choice:  Resumption of Svcs/PTA Provider Choice offered to:     DME Arranged:    DME Agency:     HH Arranged:    [place] Agency:  [facility]  Status of Service:  Completed, signed off  If discussed at [place] of Stay Meetings, dates discussed:    Additional Comments:  Elza Rafter, RN 05/20/2018, 4:20 PM

## 2018-05-20 NOTE — Progress Notes (Signed)
[facility] Cardiology [facility] Encounter Note  Patient: Marcus Williams / Admit Date: 05/19/2018 / Date of Encounter: 05/20/2018, 1:18 PM   Subjective: Patient breathing much better at this time.  Less shortness of breath.  Able to lie flat without PND and orthopnea.  No evidence of myocardial infarction with no evidence of significant elevation of troponin.  The patient has had minimal ambulation with continued shortness of breath.  Review of Systems: Positive for: Redness of breath weakness Negative for: Vision change, hearing change, syncope, dizziness, nausea, vomiting,diarrhea, bloody stool, stomach pain, cough, congestion, diaphoresis, urinary frequency, urinary pain,skin lesions, skin rashes Others previously listed  Objective: Telemetry: Normal sinus rhythm Physical Exam: Blood pressure 109/81, pulse 64, temperature (!) 97.4 F (36.3 C), temperature source Oral, resp. rate 18, height 5\' 7"  (1.702 m), weight 120.9 kg, SpO2 99 %. Body mass index is 41.74 kg/m. General: Well developed, well nourished, in no acute distress. Head: Normocephalic, atraumatic, sclera non-icteric, no xanthomas, nares are without discharge. Neck: No apparent masses Lungs: Normal respirations with no wheezes, few rhonchi, no rales , basilar crackles   Heart: Regular rate and rhythm, normal S1 S2, no murmur, no rub, no gallop, PMI is normal size and placement, carotid upstroke normal without bruit, jugular venous pressure normal Abdomen: Soft, non-tender, non-distended with normoactive bowel sounds. No hepatosplenomegaly. Abdominal aorta is normal size without bruit Extremities: 1+ edema, no clubbing, no cyanosis, no ulcers,  Peripheral: 2+ radial, 2+ femoral, 2+ dorsal pedal pulses Neuro: Alert and oriented. Moves all extremities spontaneously. Psych:  Responds to questions appropriately with a normal affect.   Intake/Output Summary (Last 24 hours) at 05/20/2018 1318 Last data filed at 05/19/2018  1324 Gross per 24 hour  Intake -  Output 625 ml  Net -625 ml    Inpatient Medications:  . apixaban  5 mg Oral BID  . atorvastatin  40 mg Oral q1800  . clopidogrel  75 mg Oral Q breakfast  . diltiazem  180 mg Oral BID  . DULoxetine  20 mg Oral Daily  . furosemide  40 mg Intravenous BID  . insulin aspart  0-5 Units Subcutaneous QHS  . insulin aspart  0-9 Units Subcutaneous TID WC  . insulin detemir  6 Units Subcutaneous Daily  . isosorbide mononitrate  30 mg Oral Daily  . lisinopril  10 mg Oral Daily  . metFORMIN  1,000 mg Oral BID WC  . metoprolol tartrate  100 mg Oral BID  . mometasone-formoterol  2 puff Inhalation BID  . pantoprazole  40 mg Oral BID  . potassium chloride SA  20 mEq Oral Daily   Infusions:   Labs: Recent Labs    05/19/18 0625 05/20/18 0420  NA 141 138  K 4.5 4.9  CL 107 103  CO2 25 25  GLUCOSE 190* 265*  BUN 13 21  CREATININE 0.66 0.92  CALCIUM 8.6* 8.4*   Recent Labs    05/19/18 0625  AST 42*  ALT 141*  ALKPHOS 105  BILITOT 1.4*  PROT 7.3  ALBUMIN 3.2*   Recent Labs    05/19/18 0625 05/20/18 0420  WBC 5.5 9.2  NEUTROABS 3.6  --   HGB 9.3* 8.8*  HCT 32.3* 30.9*  MCV 80.3 80.5  PLT 175 208   Recent Labs    05/19/18 0625  TROPONINI 0.03*   Invalid input(s): POCBNP No results for input(s): HGBA1C in the last 72 hours.   Weights: Filed Weights   05/19/18 [phone] 05/19/18 1820 05/20/18 0435  Weight: 117.9 kg 120.2 kg 120.9 kg     Radiology/Studies:  Dg Chest 2 View  Result Date: 05/06/2018 CLINICAL DATA:  Shortness of breath. EXAM: CHEST - 2 VIEW COMPARISON:  CT 04/28/2018. chest x-ray 04/28/2018, 12/03/2017. FINDINGS: Prior CABG. Stable fracture of upper median sternotomy wire. Cardiomegaly again noted. Aortic contour, this is best identified by prior CT. Moderate bilateral pulmonary infiltrates/edema. Atelectatic changes left lung base. No pneumothorax. Previously identified rib fractures best identified by prior CT.  IMPRESSION: 1. Prior CABG. Aortic conduit, this is best identified by prior CT. Cardiomegaly again noted. Moderate bilateral pulmonary infiltrates/edema. A component of CHF may be present. 2.  Atelectatic changes left lung base. Electronically Signed   By: Lyons   On: 05/06/2018 06:30   Dg Chest 2 View  Result Date: 04/28/2018 CLINICAL DATA:  Shortness of breath. EXAM: CHEST - 2 VIEW COMPARISON:  Radiograph of December 03, 2017. FINDINGS: Stable cardiomegaly. Status post coronary artery bypass graft. No pneumothorax is noted. New left perihilar and basilar opacities are noted concerning for edema or infiltrate. Small left pleural effusion cannot be excluded. Right midlung subsegmental atelectasis or scarring is noted. Bony thorax is unremarkable. IMPRESSION: New left perihilar and basilar opacities are noted concerning for edema or infiltrate. Small left pleural effusion cannot be excluded. Electronically Signed   By: Marijo Conception, M.D.   On: 04/28/2018 13:13   Ct Chest Wo Contrast  Result Date: 04/28/2018 CLINICAL DATA:  Increasing shortness of breath since a fall left night onto his left side. Abnormal chest radiograph. EXAM: CT CHEST WITHOUT CONTRAST TECHNIQUE: Multidetector CT imaging of the chest was performed following the standard protocol without IV contrast. COMPARISON:  Chest x-rays dated 04/28/2018 and 12/03/2017 and chest CT 03/24/2017 FINDINGS: Cardiovascular: Cardiomegaly. Left ventricular apical-aortic conduit in place with the valve in the conduit. The conduit extends from the left ventricular apex to the descending thoracic aorta. Extensive coronary artery calcification. Extensive aortic valvular calcification. Aortic atherosclerosis. CABG. No pericardial effusion. Pulmonary vascularity appears normal. Mediastinum/Nodes: No enlarged mediastinal or axillary lymph nodes. Thyroid gland, trachea, and esophagus demonstrate no significant findings. Lungs/Pleura: There is a small amount  of fluid loculated in the superior aspect of left major fissure. Chronic pleural thickening and slight atelectasis at the left lung base. No pneumothorax. Upper Abdomen: No acute abnormality. Musculoskeletal: There are multiple old left rib fractures. There is a subacute fracture of the posterolateral aspect of the left fifth rib. There are fractures of anterior costal cartilages of the left third, fourth, fifth and sixth ribs. There is extensive ill-defined soft tissue stranding in the subcutaneous fat of the lateral aspect of the left side of the chest. This may be due to the prior chest surgery but I cannot exclude cellulitis. No definable hematoma or abscess. This could also represent chest wall contusion from the patient's recent fall. IMPRESSION: 1. Soft tissue stranding in the subcutaneous fat of the left lateral chest wall which could represent soft tissue contusion or cellulitis or postsurgical changes from recent chest surgery. 2. Multiple old and subacute left rib fractures. 3. Fractures of the anterior costal cartilages of multiple left ribs. 4. Left ventricular apex to descending thoracic aorta conduit with valve. Electronically Signed   By: Lorriane Shire M.D.   On: 04/28/2018 14:40   Dg Chest Port 1 View  Result Date: 05/19/2018 CLINICAL DATA:  62 year old male with shortness of breath EXAM: PORTABLE CHEST 1 VIEW COMPARISON:  Head chest radiograph dated 05/06/2018 and CT dated  04/28/2018 FINDINGS: There is stable cardiomegaly. Mild bilateral perihilar vascular prominence, likely mild vascular congestion. Increased density at the left lung base likely combination of atelectasis and postsurgical changes related to left ventricular/aortic conduit seen on the CT. There is no pleural effusion or pneumothorax. Median sternotomy wires. No acute osseous pathology. IMPRESSION: Cardiomegaly with mild vascular congestion. Probable atelectasis or postsurgical changes of the left lung base. Electronically  Signed   By: Anner Crete M.D.   On: 05/19/2018 06:48     Assessment and Recommendation  62 y.o. male with acute on chronic systolic dysfunction congestive heart failure multifactorial in nature including LV systolic dysfunction but mainly dietary indiscretion and likely inability to take medications correctly.  No current evidence of myocardial infarction and improved with medication management 1.  Continue furosemide for lower extremity edema pulmonary edema and acute on chronic systolic dysfunction heart failure 2.  No further cardiac diagnostics necessary at this time due to no murmur myocardial infarction and known ejection fraction of 35% 3.  Continue lisinopril metoprolol for further risk reduction cardiovascular event and congestive heart failure and would consider as an outpatient either Entresto or spironolactone 4.  Continue high intensity cholesterol therapy for cardiovascular disease 5.  Begin ambulation and follow for improvements of symptoms and possible discharged home when able and further adjustments of medication management next week as outpatient  Signed, Serafina Royals M.D. FACC

## 2018-05-20 NOTE — Plan of Care (Signed)
  Problem: Clinical Measurements: Goal: Ability to maintain clinical measurements within normal limits will improve Outcome: Progressing Goal: Respiratory complications will improve Outcome: Progressing   Problem: Activity: Goal: Risk for activity intolerance will decrease Outcome: Progressing  Pt on room air, able to tolerate ambulation to bathroom and bathing self.

## 2018-05-20 NOTE — Progress Notes (Signed)
Pt refuses bed alarm and chair alarm educated on safety precautions and encouraged to call for help for mobility/ambulation.

## 2018-05-21 LAB — BASIC METABOLIC PANEL
ANION GAP: 8 (ref 5–15)
BUN: 33 mg/dL — ABNORMAL HIGH (ref 8–23)
CO2: 25 mmol/L (ref 22–32)
Calcium: 8.6 mg/dL — ABNORMAL LOW (ref 8.9–10.3)
Chloride: 105 mmol/L (ref 98–111)
Creatinine, Ser: 1.43 mg/dL — ABNORMAL HIGH (ref 0.61–1.24)
GFR calc Af Amer: >60 mL/min (ref >60)
GFR, EST NON AFRICAN AMERICAN: 52 mL/min — AB (ref >60)
Glucose, Bld: 129 mg/dL — ABNORMAL HIGH (ref 70–99)
Potassium: 5.2 mmol/L — ABNORMAL HIGH (ref 3.5–5.1)
Sodium: 138 mmol/L (ref 135–145)

## 2018-05-21 LAB — GLUCOSE, CAPILLARY
GLUCOSE-CAPILLARY: 77 mg/dL (ref 70–99)
Glucose-Capillary: 127 mg/dL — ABNORMAL HIGH (ref 70–99)
Glucose-Capillary: 126 mg/dL — ABNORMAL HIGH (ref 70–99)
Glucose-Capillary: 94 mg/dL (ref 70–99)
Glucose-Capillary: 59 mg/dL — ABNORMAL LOW (ref 70–99)
Glucose-Capillary: 46 mg/dL — ABNORMAL LOW (ref 70–99)
Glucose-Capillary: 90 mg/dL (ref 70–99)
Glucose-Capillary: 85 mg/dL (ref 70–99)

## 2018-05-21 LAB — BLOOD GAS, ARTERIAL
Acid-base deficit: 7.1 mmol/L — ABNORMAL HIGH (ref 0.0–2.0)
Bicarbonate: 17.9 mmol/L — ABNORMAL LOW (ref 20.0–28.0)
FIO2: 0.32
O2 Saturation: 96.8 %
Patient temperature: 37
pCO2 arterial: 34 mmHg (ref 32.0–48.0)
pH, Arterial: 7.33 — ABNORMAL LOW (ref 7.350–7.450)
pO2, Arterial: 95 mmHg (ref 83.0–108.0)

## 2018-05-21 LAB — CBC
HCT: 35 % — ABNORMAL LOW (ref 39.0–52.0)
Hemoglobin: 10 g/dL — ABNORMAL LOW (ref 13.0–17.0)
MCH: 23.4 pg — ABNORMAL LOW (ref 26.0–34.0)
MCHC: 28.6 g/dL — ABNORMAL LOW (ref 30.0–36.0)
MCV: 81.8 fL (ref 80.0–100.0)
Platelets: 291 10*3/uL (ref 150–400)
RBC: 4.28 MIL/uL (ref 4.22–5.81)
RDW: 18.7 % — ABNORMAL HIGH (ref 11.5–15.5)
WBC: 14.2 10*3/uL — ABNORMAL HIGH (ref 4.0–10.5)
nRBC: 0 % (ref 0.0–0.2)

## 2018-05-21 MED ORDER — DEXTROSE 50 % IV SOLN
INTRAVENOUS | Status: AC
  Administered 2018-05-21: 25 mL
  Filled 2018-05-21: qty 50

## 2018-05-21 MED ORDER — DEXTROSE 50 % IV SOLN
1.0000 | Freq: Once | INTRAVENOUS | Status: AC
  Administered 2018-05-21: 50 mL via INTRAVENOUS
  Filled 2018-05-21: qty 50

## 2018-05-21 MED ORDER — ONDANSETRON HCL 4 MG/2ML IJ SOLN
4.0000 mg | Freq: Four times a day (QID) | INTRAMUSCULAR | Status: DC | PRN
  Administered 2018-05-21 – 2018-05-23 (×2): 4 mg via INTRAVENOUS
  Filled 2018-05-21 (×2): qty 2

## 2018-05-21 MED ORDER — METFORMIN HCL 500 MG PO TABS
500.0000 mg | ORAL_TABLET | Freq: Two times a day (BID) | ORAL | Status: DC
  Administered 2018-05-21: 500 mg via ORAL
  Filled 2018-05-21: qty 1

## 2018-05-21 MED ORDER — SODIUM POLYSTYRENE SULFONATE 15 GM/60ML PO SUSP: 15.0000 g | Freq: Once | ORAL | Status: DC

## 2018-05-21 MED ORDER — SODIUM CHLORIDE 0.9% FLUSH
3.0000 mL | Freq: Two times a day (BID) | INTRAVENOUS | Status: DC
  Administered 2018-05-21 – 2018-06-01 (×20): 3 mL via INTRAVENOUS

## 2018-05-21 NOTE — Care Management Important Message (Signed)
Copy of signed IM left with patient in room.  

## 2018-05-21 NOTE — Progress Notes (Addendum)
Inpatient Diabetes Program Recommendations  AACE/ADA: New Consensus Statement on Inpatient Glycemic Control (2019)  Target Ranges:  Prepandial:   less than 140 mg/dL      Peak postprandial:   less than 180 mg/dL (1-2 hours)      Critically ill patients:  140 - 180 mg/dL   Results for Marcus Williams, Marcus Williams (MRN [id number]) as of 05/21/2018 07:55  Ref. Range 05/20/2018 08:11 05/20/2018 12:37 05/20/2018 17:09 05/20/2018 21:20  Glucose-Capillary Latest Ref Range: 70 - 99 mg/dL 222 (H) 303 (H) 172 (H) 143 (H)   Review of Glycemic Control  Diabetes history: DM2 Outpatient Diabetes medications: 70/30 85 units QAM, 70/30 100 units QPM (has not used 70/30 in 2-3 months), Metformin 1000 mg BID Current orders for Inpatient glycemic control: Levemir 6 units daily, Novolog 0-9 units TID with meals, Novolog 0-5 units QHS, Metformin 1000 mg BID  Inpatient Diabetes Program Recommendations:   Outpatient DM regimen: Patient reports he has NOT taken 70/30 in several months and he reports that he has 70/30 insulin vials at home as well as insulin pens at home but he is not sure what kind of insulin the pens are. MD may want to consider discharging patient on 70/30 5 units BID and Metformin 1000 mg BID and have patient follow up with PCP.  NOTE: [facility] and [facility] to see if either pharmacy has filled an insulin pen. [place] states that the only insulin patient has filled there recently is Novolin 70/30 vials on 05/04/18 and patient is prescribed 70/30 5 units BID. Called [facility] and was told that patient has never filled insulin there but does have a prescription for 70/30 and Regular insulin on his profile. Spoke with patient again about where he got insulin pens from that he states he has at home. Patient states that he can not remember where he got them and he does not know the name of the insulin pens he has at home. Would encourage patient to take insulin pens with him to  his next PCP visit to discuss with PCP as to what he needs to be taking for DM control.    Thanks, Barnie Alderman, RN, MSN, CDE Diabetes Coordinator Inpatient Diabetes Program [phone] (Team Pager from 8am to 5pm)

## 2018-05-21 NOTE — Progress Notes (Signed)
[facility] [place] NAME: Antionne Enrique    MR#:  [id number]  DATE OF BIRTH:  Sep 11, 1955  SUBJECTIVE:  CHIEF COMPLAINT:   Chief Complaint  Patient presents with  . Shortness of Breath  Still quite short of breath and lower extremity swelling REVIEW OF SYSTEMS:  Review of Systems  Constitutional: Negative for chills, fever and weight loss.  HENT: Negative for nosebleeds and sore throat.   Eyes: Negative for blurred vision.  Respiratory: Positive for shortness of breath. Negative for cough and wheezing.   Cardiovascular: Positive for leg swelling. Negative for chest pain, orthopnea and PND.  Gastrointestinal: Negative for abdominal pain, constipation, diarrhea, heartburn, nausea and vomiting.  Genitourinary: Negative for dysuria and urgency.  Musculoskeletal: Negative for back pain.  Skin: Negative for rash.  Neurological: Negative for dizziness, speech change, focal weakness and headaches.  Endo/Heme/Allergies: Does not bruise/bleed easily.  Psychiatric/Behavioral: Negative for depression.    DRUG ALLERGIES:   Allergies  Allergen Reactions  . Contrast Media [Iodinated Diagnostic Agents] Shortness Of Breath    Hypoxia/hypotension  . Versed [Midazolam] Shortness Of Breath and Other (See Comments)    Hypotension   VITALS:  Blood pressure 128/77, pulse (!) 53, temperature 98.4 F (36.9 C), resp. rate 18, height 5\' 7"  (1.702 m), weight 120.6 kg, SpO2 100 %. PHYSICAL EXAMINATION:  Physical Exam  Constitutional: He is oriented to person, place, and time.  HENT:  Head: Normocephalic and atraumatic.  Eyes: Pupils are equal, round, and reactive to light. Conjunctivae and EOM are normal.  Neck: Normal range of motion. Neck supple. No tracheal deviation present. No thyromegaly present.  Cardiovascular: Normal rate, regular rhythm and normal heart sounds.  Pulmonary/Chest: Effort normal. No respiratory distress. He has decreased breath  sounds. He has no wheezes. He exhibits no tenderness.  Abdominal: Soft. Bowel sounds are normal. He exhibits no distension. There is no tenderness.  Musculoskeletal: Normal range of motion.       Right lower leg: He exhibits edema.       Left lower leg: He exhibits edema.  Neurological: He is alert and oriented to person, place, and time. No cranial nerve deficit.  Skin: Skin is warm and dry. No rash noted.  Has pressure ulcer on sacrum with dressing in place   LABORATORY PANEL:  Male CBC Recent Labs  Lab 05/21/18 0419  WBC 14.2*  HGB 10.0*  HCT 35.0*  PLT 291   ------------------------------------------------------------------------------------------------------------------ Chemistries  Recent Labs  Lab 05/19/18 0625  05/21/18 0419  NA 141   < > 138  K 4.5   < > 5.2*  CL 107   < > 105  CO2 25   < > 25  GLUCOSE 190*   < > 129*  BUN 13   < > 33*  CREATININE 0.66   < > 1.43*  CALCIUM 8.6*   < > 8.6*  AST 42*  --   --   ALT 141*  --   --   ALKPHOS 105  --   --   BILITOT 1.4*  --   --    < > = values in this interval not displayed.   RADIOLOGY:  No results found. ASSESSMENT AND PLAN:  34 y m with acute hypoxic resp failure  * Acute Hypoxic Resp failure - due to COPD/CHF exacerbation  * Acute on chronic systolic dysfunction congestive heart failure with pulmonary edema  - continue aggressive Diuresis - Appreciate Cardio input, echo -  EF 45-50% - renal func worse, stopped lasix today.  *Diabetes mellitus -Add insulin Lantus, continue sliding scale -Diabetic nurse consult - decreased metformin due to renal failure.  * Elevated troponin - likely due to demand ischemia, monitor on tele  * Sinus tach: continue Metoprolol  *Pressure ulcer - on sacrum with stage 2 pressure injury Per wound care nurse which I agree with   Measurement: .2X.2X.1cm Wound bed: dark red Drainage (amount, consistency, odor) no odor or drainage Periwound: intact skin  surrounding Wound care nurse recommends Foam dressing to protect from further injury.  - left chest wound - Chronic full thickness wound with packing; mod amt green-tinged drainage, some odor.   .3X.3X3cm Surrounded by red macerated skin in the skin fold where it is frequently moist and rubs together; linear partial thickness fissure; .2X3X.1cm, red and moist, small amt tan drainage, no odor  Wound care nurse recommends - narrow strip of Aquacel and packed with a swab to absorb drainage and provide antimicrobial benefits.  Pt can resume previous plan of care with home health nursing after discharge.  * Hypertension   Hold lisinopril due ot renal failure  * Hyperkalemia   Stop oral K replacement.  * Ac renal failure   Stop lasix   All the records are reviewed and case discussed with Care Management/Social Worker. Management plans discussed with the patient, Nursing and they are in agreement.  CODE STATUS: Prior  TOTAL TIME TAKING CARE OF THIS PATIENT: 35 minutes.   More than 50% of the time was spent in counseling/coordination of care: YES  POSSIBLE D/C IN 1-2 DAYS, DEPENDING ON CLINICAL CONDITION.   Vaughan Basta M.D on 05/21/2018 at 6:25 PM  Between 7am to 6pm - Pager - [phone]  After 6pm go to [email or web address] - Proofreader  [facility]  Office  [phone]  CC: Primary care physician; Idelle Crouch, MD  Note: This dictation was prepared with Dragon dictation along with smaller phrase technology. Any transcriptional errors that result from this process are unintentional.

## 2018-05-21 NOTE — Progress Notes (Signed)
PT Cancellation Note  Patient Details Name: Marcus Williams MRN: [id number] DOB: 03/07/1956   Cancelled Treatment:    Reason Eval/Treat Not Completed: Fatigue/lethargy limiting ability to participate.  Attempted to see pt but upon PT arrival pt sleeping in bed.  When woken up pt unable to keep eyes open due to lethargy even when specifically spoken to.  Pt unable to participate in exertional activity at this time.   Collie Siad PT, DPT 05/21/2018, 3:14 PM

## 2018-05-21 NOTE — Progress Notes (Signed)
Using C-Pap while he naps.

## 2018-05-21 NOTE — Care Management (Signed)
Placed PT evaluation today as RN states patient had a difficult time getting to bathroom.  When this RNCM was in room yesterday he was up to chair and stated he was ambulating fine.  Will continue to follow.  He is open to [facility] home health.  Plan to discharge tomorrow.

## 2018-05-22 ENCOUNTER — Inpatient Hospital Stay: Payer: Medicare HMO

## 2018-05-22 DIAGNOSIS — F419 Anxiety disorder, unspecified: Secondary | ICD-10-CM | POA: Diagnosis not present

## 2018-05-22 DIAGNOSIS — I501 Left ventricular failure: Secondary | ICD-10-CM | POA: Diagnosis not present

## 2018-05-22 DIAGNOSIS — R001 Bradycardia, unspecified: Secondary | ICD-10-CM

## 2018-05-22 DIAGNOSIS — F319 Bipolar disorder, unspecified: Secondary | ICD-10-CM | POA: Diagnosis not present

## 2018-05-22 DIAGNOSIS — I110 Hypertensive heart disease with heart failure: Secondary | ICD-10-CM | POA: Diagnosis not present

## 2018-05-22 DIAGNOSIS — J449 Chronic obstructive pulmonary disease, unspecified: Secondary | ICD-10-CM | POA: Diagnosis not present

## 2018-05-22 DIAGNOSIS — J9611 Chronic respiratory failure with hypoxia: Secondary | ICD-10-CM | POA: Diagnosis not present

## 2018-05-22 DIAGNOSIS — I480 Paroxysmal atrial fibrillation: Secondary | ICD-10-CM | POA: Diagnosis not present

## 2018-05-22 DIAGNOSIS — E119 Type 2 diabetes mellitus without complications: Secondary | ICD-10-CM | POA: Diagnosis not present

## 2018-05-22 DIAGNOSIS — I2510 Atherosclerotic heart disease of native coronary artery without angina pectoris: Secondary | ICD-10-CM | POA: Diagnosis not present

## 2018-05-22 LAB — CBC WITH DIFFERENTIAL/PLATELET
Abs Immature Granulocytes: 0.04 10*3/uL (ref 0.00–0.07)
Basophils Absolute: 0 10*3/uL (ref 0.0–0.1)
Basophils Relative: 0 %
EOS ABS: 0 10*3/uL (ref 0.0–0.5)
Eosinophils Relative: 0 %
HEMATOCRIT: 30.3 % — AB (ref 39.0–52.0)
Hemoglobin: 8.9 g/dL — ABNORMAL LOW (ref 13.0–17.0)
Immature Granulocytes: 0 %
LYMPHS PCT: 11 %
Lymphs Abs: 1 10*3/uL (ref 0.7–4.0)
MCH: 23.2 pg — ABNORMAL LOW (ref 26.0–34.0)
MCHC: 29.4 g/dL — ABNORMAL LOW (ref 30.0–36.0)
MCV: 78.9 fL — ABNORMAL LOW (ref 80.0–100.0)
Monocytes Absolute: 0.7 10*3/uL (ref 0.1–1.0)
Monocytes Relative: 8 %
NEUTROS ABS: 7.2 10*3/uL (ref 1.7–7.7)
Neutrophils Relative %: 81 %
PLATELETS: 288 10*3/uL (ref 150–400)
RBC: 3.84 MIL/uL — ABNORMAL LOW (ref 4.22–5.81)
RDW: 18.5 % — AB (ref 11.5–15.5)
WBC: 9 10*3/uL (ref 4.0–10.5)
nRBC: 0.2 % (ref 0.0–0.2)

## 2018-05-22 LAB — RENAL FUNCTION PANEL
Albumin: 3.2 g/dL — ABNORMAL LOW (ref 3.5–5.0)
Anion gap: 12 (ref 5–15)
BUN: 61 mg/dL — ABNORMAL HIGH (ref 8–23)
CO2: 26 mmol/L (ref 22–32)
Calcium: 8.5 mg/dL — ABNORMAL LOW (ref 8.9–10.3)
Chloride: 99 mmol/L (ref 98–111)
Creatinine, Ser: 2.11 mg/dL — ABNORMAL HIGH (ref 0.61–1.24)
GFR calc Af Amer: 38 mL/min — ABNORMAL LOW (ref >60)
GFR, EST NON AFRICAN AMERICAN: 33 mL/min — AB (ref >60)
GLUCOSE: 186 mg/dL — AB (ref 70–99)
Phosphorus: 6 mg/dL — ABNORMAL HIGH (ref 2.5–4.6)
Potassium: 5.5 mmol/L — ABNORMAL HIGH (ref 3.5–5.1)
Sodium: 137 mmol/L (ref 135–145)

## 2018-05-22 LAB — PROTIME-INR
INR: 3.04
Prothrombin Time: 31 seconds — ABNORMAL HIGH (ref 11.4–15.2)

## 2018-05-22 LAB — COMPREHENSIVE METABOLIC PANEL
ALT: 68 U/L — ABNORMAL HIGH (ref 0–44)
AST: 39 U/L (ref 15–41)
Albumin: 2.8 g/dL — ABNORMAL LOW (ref 3.5–5.0)
Alkaline Phosphatase: 76 U/L (ref 38–126)
Anion gap: 11 (ref 5–15)
BUN: 55 mg/dL — AB (ref 8–23)
CO2: 24 mmol/L (ref 22–32)
Calcium: 7.4 mg/dL — ABNORMAL LOW (ref 8.9–10.3)
Chloride: 103 mmol/L (ref 98–111)
Creatinine, Ser: 2.01 mg/dL — ABNORMAL HIGH (ref 0.61–1.24)
GFR calc Af Amer: 40 mL/min — ABNORMAL LOW (ref >60)
GFR calc non Af Amer: 35 mL/min — ABNORMAL LOW (ref >60)
Glucose, Bld: 245 mg/dL — ABNORMAL HIGH (ref 70–99)
POTASSIUM: 5.6 mmol/L — AB (ref 3.5–5.1)
Sodium: 138 mmol/L (ref 135–145)
Total Bilirubin: 1.1 mg/dL (ref 0.3–1.2)
Total Protein: 5.7 g/dL — ABNORMAL LOW (ref 6.5–8.1)

## 2018-05-22 LAB — BLOOD GAS, ARTERIAL
Acid-base deficit: 0.5 mmol/L (ref 0.0–2.0)
Acid-base deficit: 0.3 mmol/L (ref 0.0–2.0)
Bicarbonate: 24.2 mmol/L (ref 20.0–28.0)
Bicarbonate: 24.1 mmol/L (ref 20.0–28.0)
DELIVERY SYSTEMS: POSITIVE
Expiratory PAP: 5
FIO2: 0.32
FIO2: 1
INSPIRATORY PAP: 12
O2 SAT: 98.1 %
O2 Saturation: 98 %
Patient temperature: 37
Patient temperature: 37
pCO2 arterial: 39 mmHg (ref 32.0–48.0)
pCO2 arterial: 38 mmHg (ref 32.0–48.0)
pH, Arterial: 7.4 (ref 7.350–7.450)
pH, Arterial: 7.41 (ref 7.350–7.450)
pO2, Arterial: 106 mmHg (ref 83.0–108.0)
pO2, Arterial: 104 mmHg (ref 83.0–108.0)

## 2018-05-22 LAB — BASIC METABOLIC PANEL
Anion gap: 12 (ref 5–15)
BUN: 51 mg/dL — ABNORMAL HIGH (ref 8–23)
CHLORIDE: 103 mmol/L (ref 98–111)
CO2: 23 mmol/L (ref 22–32)
CREATININE: 1.92 mg/dL — AB (ref 0.61–1.24)
Calcium: 8.7 mg/dL — ABNORMAL LOW (ref 8.9–10.3)
GFR calc Af Amer: 42 mL/min — ABNORMAL LOW (ref >60)
GFR calc non Af Amer: 36 mL/min — ABNORMAL LOW (ref >60)
Glucose, Bld: 173 mg/dL — ABNORMAL HIGH (ref 70–99)
Potassium: 6.1 mmol/L — ABNORMAL HIGH (ref 3.5–5.1)
Sodium: 138 mmol/L (ref 135–145)

## 2018-05-22 LAB — GLUCOSE, CAPILLARY
GLUCOSE-CAPILLARY: 167 mg/dL — AB (ref 70–99)
Glucose-Capillary: 150 mg/dL — ABNORMAL HIGH (ref 70–99)
Glucose-Capillary: 156 mg/dL — ABNORMAL HIGH (ref 70–99)
Glucose-Capillary: 159 mg/dL — ABNORMAL HIGH (ref 70–99)
Glucose-Capillary: 186 mg/dL — ABNORMAL HIGH (ref 70–99)
Glucose-Capillary: 230 mg/dL — ABNORMAL HIGH (ref 70–99)
Glucose-Capillary: 235 mg/dL — ABNORMAL HIGH (ref 70–99)

## 2018-05-22 LAB — TROPONIN I
Troponin I: 0.04 ng/mL (ref <0.03)
Troponin I: 0.05 ng/mL (ref <0.03)

## 2018-05-22 LAB — PHOSPHORUS: Phosphorus: 6.1 mg/dL — ABNORMAL HIGH (ref 2.5–4.6)

## 2018-05-22 LAB — MRSA PCR SCREENING: MRSA by PCR: NEGATIVE

## 2018-05-22 LAB — MAGNESIUM: Magnesium: 1.8 mg/dL (ref 1.7–2.4)

## 2018-05-22 LAB — LACTIC ACID, PLASMA: Lactic Acid, Venous: 2.1 mmol/L (ref 0.5–1.9)

## 2018-05-22 MED ORDER — CALCIUM GLUCONATE-NACL 1-0.675 GM/50ML-% IV SOLN
1.0000 g | Freq: Once | INTRAVENOUS | Status: AC
  Administered 2018-05-22: 1000 mg via INTRAVENOUS
  Filled 2018-05-22: qty 50

## 2018-05-22 MED ORDER — ATROPINE SULFATE 1 MG/10ML IJ SOSY
PREFILLED_SYRINGE | INTRAMUSCULAR | Status: AC
  Administered 2018-05-22: 1 mg via INTRAVENOUS
  Filled 2018-05-22: qty 10

## 2018-05-22 MED ORDER — HEPARIN SODIUM (PORCINE) 5000 UNIT/ML IJ SOLN
5000.0000 [IU] | Freq: Three times a day (TID) | INTRAMUSCULAR | Status: DC
  Administered 2018-05-22 – 2018-05-26 (×11): 5000 [IU] via SUBCUTANEOUS
  Filled 2018-05-22 (×11): qty 1

## 2018-05-22 MED ORDER — DOPAMINE-DEXTROSE 3.2-5 MG/ML-% IV SOLN: 0.0000 ug/kg/min | INTRAVENOUS | Status: DC

## 2018-05-22 MED ORDER — APIXABAN 2.5 MG PO TABS: 2.5000 mg | ORAL_TABLET | Freq: Two times a day (BID) | ORAL | Status: DC

## 2018-05-22 MED ORDER — PRAMIPEXOLE DIHYDROCHLORIDE 0.25 MG PO TABS: 0.5000 mg | ORAL_TABLET | Freq: Three times a day (TID) | ORAL | Status: DC | PRN

## 2018-05-22 MED ORDER — BUDESONIDE 0.5 MG/2ML IN SUSP
0.5000 mg | Freq: Two times a day (BID) | RESPIRATORY_TRACT | Status: DC
  Administered 2018-05-23 – 2018-06-01 (×19): 0.5 mg via RESPIRATORY_TRACT
  Filled 2018-05-22 (×19): qty 2

## 2018-05-22 MED ORDER — IPRATROPIUM-ALBUTEROL 0.5-2.5 (3) MG/3ML IN SOLN
3.0000 mL | RESPIRATORY_TRACT | Status: DC
  Administered 2018-05-22: 3 mL via RESPIRATORY_TRACT
  Filled 2018-05-22 (×2): qty 3

## 2018-05-22 MED ORDER — ATROPINE SULFATE 1 MG/10ML IJ SOSY
1.0000 mg | PREFILLED_SYRINGE | Freq: Once | INTRAMUSCULAR | Status: AC
  Administered 2018-05-22: 1 mg via INTRAVENOUS

## 2018-05-22 MED ORDER — IPRATROPIUM-ALBUTEROL 0.5-2.5 (3) MG/3ML IN SOLN
3.0000 mL | Freq: Four times a day (QID) | RESPIRATORY_TRACT | Status: DC
  Administered 2018-05-22 – 2018-05-25 (×12): 3 mL via RESPIRATORY_TRACT
  Filled 2018-05-22 (×12): qty 3

## 2018-05-22 MED ORDER — INSULIN ASPART 100 UNIT/ML ~~LOC~~ SOLN
10.0000 [IU] | SUBCUTANEOUS | Status: AC
  Administered 2018-05-22: 10 [IU] via INTRAVENOUS
  Filled 2018-05-22: qty 0.1

## 2018-05-22 MED ORDER — SODIUM ZIRCONIUM CYCLOSILICATE 10 G PO PACK
10.0000 g | PACK | Freq: Two times a day (BID) | ORAL | Status: DC
  Administered 2018-05-22 – 2018-05-25 (×6): 10 g via ORAL
  Filled 2018-05-22 (×8): qty 1

## 2018-05-22 MED ORDER — DOPAMINE-DEXTROSE 3.2-5 MG/ML-% IV SOLN
INTRAVENOUS | Status: AC
  Filled 2018-05-22: qty 250

## 2018-05-22 MED ORDER — CALCIUM GLUCONATE 10 % IV SOLN
1.0000 g | Freq: Once | INTRAVENOUS | Status: DC
  Filled 2018-05-22: qty 10

## 2018-05-22 MED ORDER — FENTANYL CITRATE (PF) 100 MCG/2ML IJ SOLN
12.5000 ug | Freq: Once | INTRAMUSCULAR | Status: AC
  Administered 2018-05-22: 12.5 ug via INTRAVENOUS
  Filled 2018-05-22: qty 2

## 2018-05-22 MED ORDER — SODIUM BICARBONATE 8.4 % IV SOLN
100.0000 meq | Freq: Once | INTRAVENOUS | Status: AC
  Administered 2018-05-22: 100 meq via INTRAVENOUS

## 2018-05-22 MED ORDER — DOPAMINE-DEXTROSE 3.2-5 MG/ML-% IV SOLN
0.0000 ug/kg/min | INTRAVENOUS | Status: DC
  Administered 2018-05-22: 20 ug/kg/min via INTRAVENOUS

## 2018-05-22 MED ORDER — ATROPINE SULFATE 1 MG/ML IJ SOLN
0.5000 mg | Freq: Once | INTRAMUSCULAR | Status: DC
  Filled 2018-05-22: qty 0.5

## 2018-05-22 MED ORDER — INSULIN ASPART 100 UNIT/ML ~~LOC~~ SOLN
0.0000 [IU] | SUBCUTANEOUS | Status: DC
  Administered 2018-05-22: 3 [IU] via SUBCUTANEOUS
  Administered 2018-05-23: 5 [IU] via SUBCUTANEOUS
  Administered 2018-05-23 (×2): 3 [IU] via SUBCUTANEOUS
  Administered 2018-05-23: 5 [IU] via SUBCUTANEOUS
  Administered 2018-05-23 – 2018-05-25 (×4): 2 [IU] via SUBCUTANEOUS
  Administered 2018-05-25: 3 [IU] via SUBCUTANEOUS
  Administered 2018-05-25 – 2018-05-26 (×3): 2 [IU] via SUBCUTANEOUS
  Administered 2018-05-26: 5 [IU] via SUBCUTANEOUS
  Administered 2018-05-27: 2 [IU] via SUBCUTANEOUS
  Administered 2018-05-27: 5 [IU] via SUBCUTANEOUS
  Administered 2018-05-27 – 2018-05-28 (×2): 2 [IU] via SUBCUTANEOUS
  Administered 2018-05-28: 3 [IU] via SUBCUTANEOUS
  Administered 2018-05-28: 2 [IU] via SUBCUTANEOUS
  Administered 2018-05-28 – 2018-05-29 (×3): 3 [IU] via SUBCUTANEOUS
  Administered 2018-05-29: 2 [IU] via SUBCUTANEOUS
  Filled 2018-05-22 (×24): qty 1

## 2018-05-22 MED ORDER — FENTANYL CITRATE (PF) 100 MCG/2ML IJ SOLN
25.0000 ug | INTRAMUSCULAR | Status: DC
  Administered 2018-05-22: 25 ug via INTRAVENOUS
  Filled 2018-05-22 (×2): qty 2

## 2018-05-22 MED ORDER — DEXTROSE 50 % IV SOLN
1.0000 | Freq: Once | INTRAVENOUS | Status: AC
  Administered 2018-05-22: 50 mL via INTRAVENOUS
  Filled 2018-05-22: qty 50

## 2018-05-22 MED ORDER — OLANZAPINE 10 MG PO TBDP
10.0000 mg | ORAL_TABLET | Freq: Once | ORAL | Status: DC
  Filled 2018-05-22: qty 1

## 2018-05-22 NOTE — Consult Note (Signed)
Reason for Consult: Bradycardia and hypotension Referring Physician: Dr. Ashby Dawes is an 62 y.o. male.  HPI: [name] is a 62 year old gentleman with past medical history remarkable for obstructive sleep apnea, CVA, coronary artery disease, congestive heart failure with an ejection fraction of 35%, atrial fibrillation, status post aortic valve replacement at [facility], COPD, morbid obesity was admitted on 12/4 for increasing shortness of breath.  He has had 2 pillow orthopnea, exertional dyspnea, initially was started on BiPAP in the emergency department, was weaned to nasal cannula and admitted to the hospital service.  He was admitted for diuresis, he was seen by the cardiology service recommended medical management, was also seen in consultation with nephrology secondary to renal insufficiency with a BUN of 51/creatinine 1.92.  Potassium was 6.1.  Rapid response was called this evening secondary to bradycardia and hypotension.  Patient's pulse was in the 90Z and his systolic blood pressures in the 80s with altered level of consciousness.  He was subsequently transferred to the intensive care unit, given multiple doses of atropine, externally paced however incomplete capture.  Cardiology was called and is at the bedside placing central line  Past Medical History:  Diagnosis Date  . A-fib ([place])   . Aortic stenosis, moderate 11/2015  . Arthritis   . CAD (coronary artery disease)   . Chronic systolic CHF (congestive heart failure) ([place])   . COPD (chronic obstructive pulmonary disease) ([place])   . Diabetes mellitus without complication ([place])    INSULIN DEPENDENT  . GERD (gastroesophageal reflux disease)   . History of cardioversion   . Hypertension   . MI (myocardial infarction) ([place])   . OSA on CPAP   . Sepsis ([place])   . Shortness of breath dyspnea   . Stroke ([place])   . SVT (supraventricular tachycardia) ([facility])     Past Surgical History:  Procedure Laterality Date  .  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 BUNIONECTOMY    . CARDIAC CATHETERIZATION N/A 10/02/2015   Procedure: Coronary/Grafts Angiography;  Surgeon: Teodoro Spray, MD;  Location: [place] CV LAB;  Service: Cardiovascular;  Laterality: N/A;  . CARDIAC CATHETERIZATION N/A 11/21/2015   Procedure: Right and Left Heart Cath;  Surgeon: Sherren Mocha, MD;  Location: [place] CV LAB;  Service: Cardiovascular;  Laterality: N/A;  . CORONARY ARTERY BYPASS GRAFT    . CORONARY STENT INTERVENTION N/A 07/30/2017   Procedure: CORONARY STENT INTERVENTION;  Surgeon: Isaias Cowman, MD;  Location: [place] CV LAB;  Service: Cardiovascular;  Laterality: N/A;  . CORONARY STENT INTERVENTION N/A 10/23/2017   Procedure: CORONARY STENT INTERVENTION;  Surgeon: Yolonda Kida, MD;  Location: [place] CV LAB;  Service: Cardiovascular;  Laterality: N/A;  . KNEE SURGERY    . LEFT HEART CATH AND CORONARY ANGIOGRAPHY N/A 10/30/2017   Procedure: LEFT HEART CATH AND CORONARY ANGIOGRAPHY;  Surgeon: Teodoro Spray, MD;  Location: [place] [facility] LAB;  Service: Cardiovascular;  Laterality: N/A;  . LEFT HEART CATH AND CORS/GRAFTS ANGIOGRAPHY N/A 10/22/2017   Procedure: LEFT HEART CATH AND CORS/GRAFTS ANGIOGRAPHY;  Surgeon: Corey Skains, MD;  Location: [place] CV LAB;  Service: Cardiovascular;  Laterality: N/A;  . quadruple bypass    . RIGHT/LEFT HEART CATH AND CORONARY/GRAFT ANGIOGRAPHY N/A 07/30/2017   Procedure: RIGHT/LEFT HEART CATH AND CORONARY/GRAFT ANGIOGRAPHY;  Surgeon: Isaias Cowman, MD;  Location: [facility]  INVASIVE CV LAB;  Service: Cardiovascular;  Laterality: N/A;  . TEE WITHOUT CARDIOVERSION  11/21/2015  . TEE WITHOUT CARDIOVERSION N/A 11/21/2015   Procedure: TRANSESOPHAGEAL ECHOCARDIOGRAM (TEE);  Surgeon: Larey Dresser, MD;  Location: [facility] ENDOSCOPY;  Service: Cardiovascular;   Laterality: N/A;  . TRACHEOSTOMY      Family History  Problem Relation Age of Onset  . Stroke Mother   . Heart attack Mother   . Hypertension Mother   . Heart attack Father   . Hypertension Father   . Heart attack Brother        #1  . Diabetes Brother        #1  . Heart disease Brother        #2  . Lung disease Brother        #2  . Hypertension Brother        #2  . Diabetes Brother        #2    Social History:  reports that he has never smoked. He has never used smokeless tobacco. He reports that he does not drink alcohol or use drugs.  Allergies:  Allergies  Allergen Reactions  . Contrast Media [Iodinated Diagnostic Agents] Shortness Of Breath    Hypoxia/hypotension  . Versed [Midazolam] Shortness Of Breath and Other (See Comments)    Hypotension    Medications: I have reviewed the patient's current medications.  Results for orders placed or performed during the hospital encounter of 05/19/18 (from the past 48 hour(s))  Glucose, capillary     Status: Abnormal   Collection Time: 05/20/18  9:20 PM  Result Value Ref Range   Glucose-Capillary 143 (H) 70 - 99 mg/dL  Basic metabolic panel     Status: Abnormal   Collection Time: 05/21/18  4:19 AM  Result Value Ref Range   Sodium 138 135 - 145 mmol/L   Potassium 5.2 (H) 3.5 - 5.1 mmol/L   Chloride 105 98 - 111 mmol/L   CO2 25 22 - 32 mmol/L   Glucose, Bld 129 (H) 70 - 99 mg/dL   BUN 33 (H) 8 - 23 mg/dL   Creatinine, Ser 1.43 (H) 0.61 - 1.24 mg/dL   Calcium 8.6 (L) 8.9 - 10.3 mg/dL   GFR calc non Af Amer 52 (L) >60 mL/min   GFR calc Af Amer >60 >60 mL/min   Anion gap 8 5 - 15    Comment: Performed at [facility], [address] [place], [place] [address]  CBC     Status: Abnormal   Collection Time: 05/21/18  4:19 AM  Result Value Ref Range   WBC 14.2 (H) 4.0 - 10.5 K/uL   RBC 4.28 4.22 - 5.81 MIL/uL   Hemoglobin 10.0 (L) 13.0 - 17.0 g/dL   HCT 35.0 (L) 39.0 - 52.0 %   MCV 81.8 80.0 - 100.0 fL    MCH 23.4 (L) 26.0 - 34.0 pg   MCHC 28.6 (L) 30.0 - 36.0 g/dL   RDW 18.7 (H) 11.5 - 15.5 %   Platelets 291 150 - 400 K/uL   nRBC 0.0 0.0 - 0.2 %    Comment: Performed at [facility], [address] [place], [place] [address]  Glucose, capillary     Status: Abnormal   Collection Time: 05/21/18  8:17 AM  Result Value Ref Range   Glucose-Capillary 127 (H) 70 - 99 mg/dL  Glucose, capillary     Status: Abnormal   Collection Time: 05/21/18 12:02 PM  Result  Value Ref Range   Glucose-Capillary 126 (H) 70 - 99 mg/dL  Glucose, capillary     Status: None   Collection Time: 05/21/18  4:56 PM  Result Value Ref Range   Glucose-Capillary 94 70 - 99 mg/dL  Glucose, capillary     Status: Abnormal   Collection Time: 05/21/18  7:25 PM  Result Value Ref Range   Glucose-Capillary 59 (L) 70 - 99 mg/dL  Blood gas, arterial     Status: Abnormal   Collection Time: 05/21/18  7:36 PM  Result Value Ref Range   FIO2 0.32    Delivery systems NASAL CANNULA    pH, Arterial 7.33 (L) 7.350 - 7.450   pCO2 arterial 34 32.0 - 48.0 mmHg   pO2, Arterial 95 83.0 - 108.0 mmHg   Bicarbonate 17.9 (L) 20.0 - 28.0 mmol/L   Acid-base deficit 7.1 (H) 0.0 - 2.0 mmol/L   O2 Saturation 96.8 %   Patient temperature 37.0    Collection site RIGHT RADIAL    Sample type ARTERIAL DRAW    Allens test (pass/fail) PASS PASS    Comment: Performed at [facility], [address] [place], [place] [address]  Glucose, capillary     Status: Abnormal   Collection Time: 05/21/18  7:39 PM  Result Value Ref Range   Glucose-Capillary 46 (L) 70 - 99 mg/dL  Glucose, capillary     Status: None   Collection Time: 05/21/18  8:01 PM  Result Value Ref Range   Glucose-Capillary 77 70 - 99 mg/dL  Glucose, capillary     Status: None   Collection Time: 05/21/18  8:49 PM  Result Value Ref Range   Glucose-Capillary 90 70 - 99 mg/dL  Glucose, capillary     Status: None   Collection Time: 05/21/18  9:37 PM  Result Value Ref  Range   Glucose-Capillary 85 70 - 99 mg/dL  Glucose, capillary     Status: Abnormal   Collection Time: 05/22/18  3:35 AM  Result Value Ref Range   Glucose-Capillary 235 (H) 70 - 99 mg/dL  Basic metabolic panel     Status: Abnormal   Collection Time: 05/22/18  4:02 AM  Result Value Ref Range   Sodium 138 135 - 145 mmol/L   Potassium 6.1 (H) 3.5 - 5.1 mmol/L   Chloride 103 98 - 111 mmol/L   CO2 23 22 - 32 mmol/L   Glucose, Bld 173 (H) 70 - 99 mg/dL   BUN 51 (H) 8 - 23 mg/dL   Creatinine, Ser 1.92 (H) 0.61 - 1.24 mg/dL   Calcium 8.7 (L) 8.9 - 10.3 mg/dL   GFR calc non Af Amer 36 (L) >60 mL/min   GFR calc Af Amer 42 (L) >60 mL/min   Anion gap 12 5 - 15    Comment: Performed at [facility], [address] [place], [place] [address]  Glucose, capillary     Status: Abnormal   Collection Time: 05/22/18  7:37 AM  Result Value Ref Range   Glucose-Capillary 159 (H) 70 - 99 mg/dL  Glucose, capillary     Status: Abnormal   Collection Time: 05/22/18 11:42 AM  Result Value Ref Range   Glucose-Capillary 186 (H) 70 - 99 mg/dL  Blood gas, arterial     Status: None   Collection Time: 05/22/18 12:01 PM  Result Value Ref Range   FIO2 0.32    Delivery systems NASAL CANNULA    pH, Arterial 7.40 7.350 - 7.450  pCO2 arterial 39 32.0 - 48.0 mmHg   pO2, Arterial 106 83.0 - 108.0 mmHg   Bicarbonate 24.2 20.0 - 28.0 mmol/L   Acid-base deficit 0.5 0.0 - 2.0 mmol/L   O2 Saturation 98.1 %   Patient temperature 37.0    Collection site RIGHT BRACHIAL    Sample type ARTERIAL DRAW     Comment: Performed at [facility], [address] [place], [place] [address]  Glucose, capillary     Status: Abnormal   Collection Time: 05/22/18  4:57 PM  Result Value Ref Range   Glucose-Capillary 150 (H) 70 - 99 mg/dL    Dg Chest 2 View  Result Date: 05/22/2018 CLINICAL DATA:  Hypoxia. EXAM: CHEST - 2 VIEW COMPARISON:  Chest x-ray dated May 19, 2018. FINDINGS: Stable cardiomegaly status  post CABG. Mild pulmonary vascular congestion is unchanged. Unchanged left ventricular/aortic conduit in the left lower lobe. Unchanged left lower lobe atelectasis. No pleural effusion or pneumothorax. No acute osseous abnormality. IMPRESSION: 1. Stable cardiomegaly and pulmonary vascular congestion. 2. Unchanged postsurgical changes and atelectasis in the left lower lobe. Electronically Signed   By: Titus Dubin M.D.   On: 05/22/2018 11:08   US Renal  Result Date: 05/22/2018 CLINICAL DATA:  Acute renal failure. EXAM: RENAL / URINARY TRACT ULTRASOUND COMPLETE COMPARISON:  10/07/2017 FINDINGS: Right Kidney: Renal measurements: 12.7 x 6.6 x 6.3 cm = volume: 279.7 mL . Normal renal cortical thickness and echogenicity without focal lesions or hydronephrosis. Left Kidney: Renal measurements: 11.3 x 6.0 x 6.4 cm = volume: 220.2 mL. Normal renal cortical thickness and echogenicity without focal lesions or hydronephrosis. Bladder: Appears normal for degree of bladder distention. Other: Small amount of free fluid noted around the liver and spleen. IMPRESSION: 1. Normal sonographic appearance of both kidneys. No hydronephrosis. 2. Small amount of free fluid noted around the liver and spleen. Electronically Signed   By: Marijo Sanes M.D.   On: 05/22/2018 12:39    ROS  We will to obtain review of systems as patient is intermittently responsive Blood pressure (!) 92/57, pulse (!) 41, temperature 97.8 F (36.6 C), temperature source Axillary, resp. rate (!) 26, height 5\' 7"  (1.702 m), weight 110 kg, SpO2 100 %. Physical Exam Vital signs: Patient is bradycardic with ventricular response in the 30s sinus mechanism with occasional junctional escape.  Systolic blood pressure is ranging between 80 and 90.  Patient is intermittently responsive but somnolent HEENT: Trachea is midline, no thyromegaly appreciated, morbidly obese neck Cardiovascular: Bradycardia Pulmonary: Clear to auscultation.  He does have a bandage  and packing from a chronic left chest wound Abdominal: Morbid obesity Extremities: Chronic venous stasis and edema Neurologic: Intermittently responsive but easily arousable, moves all extremities  Assessment/Plan: Bradycardia/hypotension.  Patient did have an elevated potassium at 6.1 per report was treated this morning.  Will empirically give 2 A of bicarbonate, insulin after D50, calcium gluconate.  He is presently being seen by cardiology, he received multiple doses of atropine.  May require transvenous pacer if does not improve.  Congestive heart failure.  Per cardiology last EF 35%.  Status post aortic valve replacement  Acute renal failure.  Being seen by nephrology, diuretics on hold, following closely  Hyperkalemia.  Is being treated, will obtain stat blood work  Diabetes mellitus.  Care time 30 minutes Hermelinda Dellen, DO   Max[place]illion Banas 05/22/2018, 6:42 PM

## 2018-05-22 NOTE — Procedures (Signed)
Date of procedure: 05/22/2018  brief procedure note:    Preprocedural diagnosis, poor venous access with hyperkalemia, need for medication administration and frequent blood sampling Postoperative diagnosis: Same Procedure performed: Ultrasound-guided right common femoral artery triple-lumen catheter placement Surgeon: Dr. Shelia Media Anesthesia: Local only IV fluids: None Drains: None Specimens: None Blood loss: 3 cc  Specifics of procedure: Patient was supine in bed in ICU bed 14.  Consent had previously been obtained.  After obtaining timeout, prep and drape was performed in usual aseptic fashion of the right leg.  Protective gown, mask, gloves, were all utilized and eyewear.  ChloraPrep was used.  Skin and subcutaneous tissue was anesthetized generously with local and sterile cover on the ultrasound used to insonate the vein.  This was successfully cannulated with the finder needle, wire advanced without incident.  Skin was incised with 11 blade, dilatation performed with the provided dilator, and the triple-lumen catheter placed over the wire.  Good aspiration and flush of all 3 lm was noted and this was secured to the skin with the provided silk suture.  Sterile dressing was applied.  No immediate complications.  Zara Chess, MD Vascular service coverage

## 2018-05-22 NOTE — Plan of Care (Signed)
  Problem: Health Behavior/Discharge Planning: Goal: Ability to manage health-related needs will improve Outcome: Not Progressing Note:  Patient's kidney function worsening today. Nephrology has been consulted. No IV fluids as of yet. Poor appetite. Will continue to monitor renal function. Wenda Low [facility]

## 2018-05-22 NOTE — Significant Event (Signed)
Rapid Response Event Note  Overview: Time Called: 8022 Arrival Time: 1728 Event Type: Cardiac, Neurologic  Initial Focused Assessment: called for rapid response for Bradycardia in the 30's, pt laying in bed, responds to voice, no c/o pain.    Interventions:called Dr Saralyn Pilar and Dr Jefferson Fuel, transferred pt to ICU for external pacing/ dopamine and evaluation for temp pacer.   Plan of Care (if not transferred):  Event Summary: Name of Physician Notified: [place] at [facility]  Name of Consulting Physician Notified: [place] at 1730  Outcome: Transferred (Comment)(To ICU for external pacing/ evaluate for temp pacer)  Event End Time: [place]

## 2018-05-22 NOTE — Progress Notes (Signed)
   05/22/18 1900  Clinical Encounter Type  Visited With Patient  Visit Type Initial;Other (Comment) (Rapid Response)  Referral From Chaplain  Recommendations Follow up as needed  Spiritual Encounters  Spiritual Needs Prayer;Emotional  Stress Factors  Patient Stress Factors Health changes   Chaplain responded to a Rapid Response and helped the care team locate the patient's daughter and family. Chaplain also spoke with the patient's wife at [facility] to keep her informed. Chaplain provided pastoral presence, facilitated communications, and offered energetic prayer for the patient and care team.

## 2018-05-22 NOTE — Progress Notes (Signed)
PT Cancellation Note  Patient Details Name: Marcus Williams MRN: [id number] DOB: [birth year]   Cancelled Treatment:    Reason Eval/Treat Not Completed: Medical issues which prohibited therapy: Potassium is too high, creatinine levels also high 1.92 mg/dl.   [address], [place], PT DPT 05/22/2018, 11:22 AM

## 2018-05-22 NOTE — Progress Notes (Signed)
   The right groin was prepped and draped in usual sterile manner.  Anesthesia was obtained 1% lidocaine locally.  Access was obtained to the right femoral vein.  A triple-lumen filter was inserted to the right femoral vein.  There were no periprocedural complications.

## 2018-05-22 NOTE — Progress Notes (Signed)
   05/22/18 2100  Clinical Encounter Type  Visited With Patient;Family  Visit Type Follow-up;Spiritual support  Recommendations Follow-up as requested.  Spiritual Encounters  Spiritual Needs Emotional  Stress Factors  Patient Stress Factors Health changes   Chaplain returned to ICU and assisted the [place] family in their first visit. Patient was agitated but seemed to respond well to the presence of his family. Chaplain continued to provide emotional and spiritual support.

## 2018-05-22 NOTE — Progress Notes (Signed)
Patient ID: MENDEL BINSFELD, male   DOB: March 16, 1956, 62 y.o.   MRN: [id number] Pulmonary/critical care  Procedure note Right femoral central line Indication: Inadequate access with hypotension Patient is intermittently responsive and performed emergently.  Performed under ultrasound guidance Complete contact barrier precaution utilized Subdermal lidocaine/topical Hibiclens Under ultrasound guidance the right femoral vein was cannulated Guidewire was placed and needle removed The dilator unfortunately was unable to traverse the subcutaneous tissue and bent.  Procedure aborted Being performed by cardiology at this time  Hermelinda Dellen, DO

## 2018-05-22 NOTE — Consult Note (Signed)
[facility] CONSULT NOTE    Date: 05/22/2018                  Patient Name:  Marcus Williams  MRN: [id number]  DOB: 1956-02-11  Age / Sex: 62 y.o., male         PCP: Idelle Crouch, MD                 Service Requesting Consult: Hospitalist                 Reason for Consult: Acute renal failure            History of Present Illness: Patient is a 62 y.o. male with a PMHx of atrial fibrillation, aortic stenosis, coronary disease, chronic systolic heart failure ejection fraction 45 to 50%, COPD, diabetes mellitus type 2, GERD, hypertension, obstructive sleep apnea, history of CVA, history of SVT, who was admitted to [facility] on 05/19/2018 for evaluation of increasing shortness of breath.  Patient states that he has been having shortness of breath with minimal exertion.  He has 2 pillow orthopnea as well.  He was initially started on BiPAP.  He underwent some diuresis but developed worsening acute renal failure.  His baseline creatinine is 0.6.  He has been taken off of Lasix for now.   Medications: Outpatient medications: Medications Prior to Admission  Medication Sig Dispense Refill Last Dose  . albuterol (PROVENTIL HFA;VENTOLIN HFA) 108 (90 Base) MCG/ACT inhaler Inhale 2 puffs into the lungs every 6 (six) hours as needed for wheezing or shortness of breath. 1 Inhaler 10 prn at prn  . apixaban (ELIQUIS) 5 MG TABS tablet Take 5 mg by mouth 2 (two) times daily.   05/18/2018 at 2100  . atorvastatin (LIPITOR) 40 MG tablet Take 1 tablet (40 mg total) by mouth daily at 6 PM. 30 tablet 0 05/18/2018 at 1600  . DULoxetine (CYMBALTA) 20 MG capsule Take 20 mg by mouth daily.   05/18/2018 at 0800  . Fluticasone-Salmeterol (ADVAIR) 250-50 MCG/DOSE AEPB Inhale 1 puff into the lungs 2 (two) times daily. 60 each 1 05/18/2018 at 2100  . insulin NPH-regular Human (NOVOLIN 70/30) (70-30) 100 UNIT/ML injection Inject 85-100 Units into the skin 2 (two) times daily with a meal. 85 units in the  morning and 100 units in the evening. Patient gets this but has not been taking for a couple months     . isosorbide mononitrate (IMDUR) 30 MG 24 hr tablet Take 1 tablet (30 mg total) by mouth daily. 30 tablet 2 05/18/2018 at 0800  . lisinopril (PRINIVIL,ZESTRIL) 10 MG tablet Take 1 tablet (10 mg total) by mouth daily. 30 tablet 2 05/18/2018 at 0800  . metFORMIN (GLUCOPHAGE) 1000 MG tablet Take 1,000 mg by mouth 2 (two) times daily with a meal.   05/18/2018 at 2100  . metoprolol (LOPRESSOR) 100 MG tablet Take 100 mg by mouth 2 (two) times daily.   05/18/2018 at 2100  . pantoprazole (PROTONIX) 40 MG tablet Take 1 tablet (40 mg total) by mouth 2 (two) times daily. 60 tablet 2 05/18/2018 at 2100  . potassium chloride 20 MEQ TBCR Take 20 mEq by mouth daily. 34 tablet 6 05/18/2018 at 0800  . torsemide (DEMADEX) 20 MG tablet Take 2 tablets (40 mg total) by mouth daily. 60 tablet 2 05/18/2018 at 0800  . zolpidem (AMBIEN) 5 MG tablet Take 1 tablet (5 mg total) by mouth at bedtime as needed for sleep. 10 tablet 0  prn at prn  . baclofen (LIORESAL) 10 MG tablet Take 10 mg by mouth 2 (two) times daily as needed for muscle spasms.    Not Taking at Unknown time  . chlorpheniramine-HYDROcodone (TUSSIONEX PENNKINETIC ER) 10-8 MG/5ML SUER Take 5 mLs by mouth every 12 (twelve) hours as needed. (Patient not taking: Reported on 05/19/2018) 115 mL 0 Not Taking at Unknown time  . clopidogrel (PLAVIX) 75 MG tablet Take 1 tablet (75 mg total) by mouth daily with breakfast. (Patient not taking: Reported on 05/19/2018) 30 tablet 0 Not Taking  . diltiazem (CARDIZEM CD) 180 MG 24 hr capsule Take 180 mg by mouth 2 (two) times daily.   Not Taking  . nitroGLYCERIN (NITROSTAT) 0.4 MG SL tablet Place 0.4 mg under the tongue every 5 (five) minutes as needed for chest pain.   prn at prn  . pramipexole (MIRAPEX) 0.5 MG tablet Take 0.5 mg by mouth 3 (three) times daily as needed.    Not Taking at Unknown time    Current medications: Current  Facility-Administered Medications  Medication Dose Route Frequency Provider Last Rate Last Dose  . albuterol (PROVENTIL) (2.5 MG/3ML) 0.083% nebulizer solution 2.5 mg  2.5 mg Nebulization Q6H PRN Vaughan Basta, MD      . atorvastatin (LIPITOR) tablet 40 mg  40 mg Oral q1800 Max Sane, MD   40 mg at 05/21/18 1701  . chlorpheniramine-HYDROcodone (TUSSIONEX) 10-8 MG/5ML suspension 5 mL  5 mL Oral Q6H PRN Vaughan Basta, MD   5 mL at 05/21/18 2252  . clopidogrel (PLAVIX) tablet 75 mg  75 mg Oral Q breakfast Max Sane, MD   75 mg at 05/22/18 6789  . diltiazem (CARDIZEM CD) 24 hr capsule 180 mg  180 mg Oral BID Max Sane, MD   180 mg at 05/22/18 0930  . DULoxetine (CYMBALTA) DR capsule 20 mg  20 mg Oral Daily Manuella Ghazi, Vipul, MD   20 mg at 05/22/18 0930  . heparin injection 5,000 Units  5,000 Units Subcutaneous Q8H Vaughan Basta, MD   5,000 Units at 05/22/18 1303  . insulin aspart (novoLOG) injection 0-5 Units  0-5 Units Subcutaneous QHS Vaughan Basta, MD      . insulin aspart (novoLOG) injection 0-9 Units  0-9 Units Subcutaneous TID WC Vaughan Basta, MD   2 Units at 05/22/18 1223  . ipratropium-albuterol (DUONEB) 0.5-2.5 (3) MG/3ML nebulizer solution 3 mL  3 mL Nebulization Q4H Vaughan Basta, MD   3 mL at 05/22/18 1325  . isosorbide mononitrate (IMDUR) 24 hr tablet 30 mg  30 mg Oral Daily Manuella Ghazi, Vipul, MD   30 mg at 05/22/18 0930  . metoprolol tartrate (LOPRESSOR) tablet 100 mg  100 mg Oral BID Max Sane, MD   100 mg at 05/22/18 0930  . mometasone-formoterol (DULERA) 200-5 MCG/ACT inhaler 2 puff  2 puff Inhalation BID Max Sane, MD   2 puff at 05/22/18 0929  . nitroGLYCERIN (NITROSTAT) SL tablet 0.4 mg  0.4 mg Sublingual Q5 min PRN Max Sane, MD      . ondansetron [facility]) injection 4 mg  4 mg Intravenous Q6H PRN Gouru, Aruna, MD   4 mg at 05/21/18 2134  . pantoprazole (PROTONIX) EC tablet 40 mg  40 mg Oral BID Max Sane, MD   40 mg at  05/22/18 0930  . sodium chloride (OCEAN) 0.65 % nasal spray 1 spray  1 spray Each Nare PRN Vaughan Basta, MD   1 spray at 05/21/18 0848  . sodium chloride flush (NS) 0.9 %  injection 3 mL  3 mL Intravenous Q12H Vaughan Basta, MD   3 mL at 05/22/18 0929  . sodium polystyrene (KAYEXALATE) 15 GM/60ML suspension 15 g  15 g Oral Once Vaughan Basta, MD      . sodium zirconium cyclosilicate (LOKELMA) packet 10 g  10 g Oral BID Vaughan Basta, MD   10 g at 05/22/18 0930  . zolpidem (AMBIEN) tablet 5 mg  5 mg Oral QHS PRN Max Sane, MD   5 mg at 05/20/18 2201      Allergies: Allergies  Allergen Reactions  . Contrast Media [Iodinated Diagnostic Agents] Shortness Of Breath    Hypoxia/hypotension  . Versed [Midazolam] Shortness Of Breath and Other (See Comments)    Hypotension      Past Medical History: Past Medical History:  Diagnosis Date  . A-fib ([place])   . Aortic stenosis, moderate 11/2015  . Arthritis   . CAD (coronary artery disease)   . Chronic systolic CHF (congestive heart failure) ([place])   . COPD (chronic obstructive pulmonary disease) ([place])   . Diabetes mellitus without complication ([place])    INSULIN DEPENDENT  . GERD (gastroesophageal reflux disease)   . History of cardioversion   . Hypertension   . MI (myocardial infarction) ([place])   . OSA on CPAP   . Sepsis ([place])   . Shortness of breath dyspnea   . Stroke ([place])   . SVT (supraventricular tachycardia) ([facility])      Past Surgical History: Past Surgical History:  Procedure Laterality Date  . 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 BUNIONECTOMY    . CARDIAC CATHETERIZATION N/A 10/02/2015   Procedure: Coronary/Grafts Angiography;  Surgeon: Teodoro Spray, MD;  Location: [place] CV LAB;  Service: Cardiovascular;  Laterality: N/A;  . CARDIAC CATHETERIZATION N/A 11/21/2015    Procedure: Right and Left Heart Cath;  Surgeon: Sherren Mocha, MD;  Location: [place] CV LAB;  Service: Cardiovascular;  Laterality: N/A;  . CORONARY ARTERY BYPASS GRAFT    . CORONARY STENT INTERVENTION N/A 07/30/2017   Procedure: CORONARY STENT INTERVENTION;  Surgeon: Isaias Cowman, MD;  Location: [place] CV LAB;  Service: Cardiovascular;  Laterality: N/A;  . CORONARY STENT INTERVENTION N/A 10/23/2017   Procedure: CORONARY STENT INTERVENTION;  Surgeon: Yolonda Kida, MD;  Location: [place] CV LAB;  Service: Cardiovascular;  Laterality: N/A;  . KNEE SURGERY    . LEFT HEART CATH AND CORONARY ANGIOGRAPHY N/A 10/30/2017   Procedure: LEFT HEART CATH AND CORONARY ANGIOGRAPHY;  Surgeon: Teodoro Spray, MD;  Location: [place] CV LAB;  Service: Cardiovascular;  Laterality: N/A;  . LEFT HEART CATH AND CORS/GRAFTS ANGIOGRAPHY N/A 10/22/2017   Procedure: LEFT HEART CATH AND CORS/GRAFTS ANGIOGRAPHY;  Surgeon: Corey Skains, MD;  Location: [place] CV LAB;  Service: Cardiovascular;  Laterality: N/A;  . quadruple bypass    . RIGHT/LEFT HEART CATH AND CORONARY/GRAFT ANGIOGRAPHY N/A 07/30/2017   Procedure: RIGHT/LEFT HEART CATH AND CORONARY/GRAFT ANGIOGRAPHY;  Surgeon: Isaias Cowman, MD;  Location: [place] CV LAB;  Service: Cardiovascular;  Laterality: N/A;  . TEE WITHOUT CARDIOVERSION  11/21/2015  . TEE WITHOUT CARDIOVERSION N/A 11/21/2015   Procedure: TRANSESOPHAGEAL ECHOCARDIOGRAM (TEE);  Surgeon: Larey Dresser, MD;  Location: [facility] END[facility];  Service: Cardiovascular;  Laterality: N/A;  . TRACHEOSTOMY       Family History: Family History  Problem Relation Age of Onset  .  Stroke Mother   . Heart attack Mother   . Hypertension Mother   . Heart attack Father   . Hypertension Father   . Heart attack Brother        #1  . Diabetes Brother        #1  . Heart disease Brother        #2  . Lung disease Brother        #2  . Hypertension Brother         #2  . Diabetes Brother        #2     Social History: Social History   Socioeconomic History  . Marital status: Married    Spouse name: Not on file  . Number of children: Not on file  . Years of education: Not on file  . Highest education level: Not on file  Occupational History  . Not on file  Social Needs  . Financial resource strain: Not on file  . Food insecurity:    Worry: Not on file    Inability: Not on file  . Transportation needs:    Medical: Not on file    Non-medical: Not on file  Tobacco Use  . Smoking status: Never Smoker  . Smokeless tobacco: Never Used  Substance and Sexual Activity  . Alcohol use: No  . Drug use: No  . Sexual activity: Yes  Lifestyle  . Physical activity:    Days per week: Not on file    Minutes per session: Not on file  . Stress: Not on file  Relationships  . Social connections:    Talks on phone: Not on file    Gets together: Not on file    Attends religious service: Not on file    Active member of club or organization: Not on file    Attends meetings of clubs or organizations: Not on file    Relationship status: Not on file  . Intimate partner violence:    Fear of current or ex partner: Not on file    Emotionally abused: Not on file    Physically abused: Not on file    Forced sexual activity: Not on file  Other Topics Concern  . Not on file  Social History Narrative  . Not on file     Review of Systems: As per HPI  Vital Signs: Blood pressure 90/67, pulse 74, temperature 98.1 F (36.7 C), temperature source Oral, resp. rate 19, height 5\' 7"  (1.702 m), weight 110 kg, SpO2 96 %.  Weight trends: Filed Weights   05/20/18 0435 05/21/18 0513 05/22/18 0340  Weight: 120.9 kg 120.6 kg 110 kg    Physical Exam: General: NAD, resting in bed  Head: Normocephalic, atraumatic.  Eyes: Anicteric, EOMI  Nose: Mucous membranes moist, not inflammed, nonerythematous.  Throat: Oropharynx nonerythematous, no exudate appreciated.    Neck: Supple, trachea midline.  Lungs:  Bilateral rales and wheezing  Heart: RRR. S1 and S2 normal without gallop, murmur, or rubs.  Abdomen:  BS normoactive. Soft, Nondistended, non-tender.  No masses or organomegaly.  Extremities: No pretibial edema.  Neurologic: A&O X3, Motor strength is 5/5 in the all 4 extremities  Skin: No visible rashes, scars.    Lab results: Basic Metabolic Panel: Recent Labs  Lab 05/20/18 0420 05/21/18 0419 05/22/18 0402  NA 138 138 138  K 4.9 5.2* 6.1*  CL 103 105 103  CO2 25 25 23   GLUCOSE 265* 129* 173*  BUN 21 33* 51*  CREATININE 0.92 1.43* 1.92*  CALCIUM 8.4* 8.6* 8.7*    Liver Function Tests: Recent Labs  Lab 05/19/18 0625  AST 42*  ALT 141*  ALKPHOS 105  BILITOT 1.4*  PROT 7.3  ALBUMIN 3.2*   No results for input(s): LIPASE, AMYLASE in the last 168 hours. No results for input(s): AMMONIA in the last 168 hours.  CBC: Recent Labs  Lab 05/19/18 0625 05/20/18 0420 05/21/18 0419  WBC 5.5 9.2 14.2*  NEUTROABS 3.6  --   --   HGB 9.3* 8.8* 10.0*  HCT 32.3* 30.9* 35.0*  MCV 80.3 80.5 81.8  PLT 175 208 291    Cardiac Enzymes: Recent Labs  Lab 05/19/18 0625  TROPONINI 0.03*    BNP: Invalid input(s): POCBNP  CBG: Recent Labs  Lab 05/21/18 2049 05/21/18 2137 05/22/18 0335 05/22/18 0737 05/22/18 1142  GLUCAP 90 85 235* 159* 186*    Microbiology: Results for orders placed or performed during the hospital encounter of 10/28/17  MRSA PCR Screening     Status: None   Collection Time: 10/30/17  2:39 PM  Result Value Ref Range Status   MRSA by PCR NEGATIVE NEGATIVE Final    Comment:        The GeneXpert MRSA Assay (FDA approved for NASAL specimens only), is one component of a comprehensive MRSA colonization surveillance program. It is not intended to diagnose MRSA infection nor to guide or monitor treatment for MRSA infections. Performed at [facility], [address] [place], [place] [address]      Coagulation Studies: No results for input(s): LABPROT, INR in the last 72 hours.  Urinalysis: No results for input(s): COLORURINE, LABSPEC, PHURINE, GLUCOSEU, HGBUR, BILIRUBINUR, KETONESUR, PROTEINUR, UROBILINOGEN, NITRITE, LEUKOCYTESUR in the last 72 hours.  Invalid input(s): APPERANCEUR    Imaging: Dg Chest 2 View  Result Date: 05/22/2018 CLINICAL DATA:  Hypoxia. EXAM: CHEST - 2 VIEW COMPARISON:  Chest x-ray dated May 19, 2018. FINDINGS: Stable cardiomegaly status post CABG. Mild pulmonary vascular congestion is unchanged. Unchanged left ventricular/aortic conduit in the left lower lobe. Unchanged left lower lobe atelectasis. No pleural effusion or pneumothorax. No acute osseous abnormality. IMPRESSION: 1. Stable cardiomegaly and pulmonary vascular congestion. 2. Unchanged postsurgical changes and atelectasis in the left lower lobe. Electronically Signed   By: Titus Dubin M.D.   On: 05/22/2018 11:08   US Renal  Result Date: 05/22/2018 CLINICAL DATA:  Acute renal failure. EXAM: RENAL / URINARY TRACT ULTRASOUND COMPLETE COMPARISON:  10/07/2017 FINDINGS: Right Kidney: Renal measurements: 12.7 x 6.6 x 6.3 cm = volume: 279.7 mL . Normal renal cortical thickness and echogenicity without focal lesions or hydronephrosis. Left Kidney: Renal measurements: 11.3 x 6.0 x 6.4 cm = volume: 220.2 mL. Normal renal cortical thickness and echogenicity without focal lesions or hydronephrosis. Bladder: Appears normal for degree of bladder distention. Other: Small amount of free fluid noted around the liver and spleen. IMPRESSION: 1. Normal sonographic appearance of both kidneys. No hydronephrosis. 2. Small amount of free fluid noted around the liver and spleen. Electronically Signed   By: Marijo Sanes M.D.   On: 05/22/2018 12:39      Assessment & Plan: Pt is a 62 y.o. male  with a PMHx of atrial fibrillation, aortic stenosis, coronary disease, chronic systolic heart failure ejection fraction 45 to  50%, COPD, diabetes mellitus type 2, GERD, hypertension, obstructive sleep apnea, history of CVA, history of SVT, who was admitted to [facility] on 05/19/2018 for evaluation of increasing shortness of breath.  1.  Acute renal failure, normal baseline Cr 0.6 2.  Acute on chronic systolic heart failure exacerbation, EF 45-50% 3.  Hyperkalemia. 4.  Anemia unspecified.  Plan: We are asked to see the patient for evaluation management of acute renal failure.  Suspect this is now secondary to cardiorenal syndrome.  Agree with discontinuation of lisinopril as well as furosemide.  Hesitant to provide IV fluid hydration however.  Renal ultrasound was checked and was negative for hydronephrosis.  Hyperkalemia noted with a serum potassium of 6.1.  Continue sodium zirconium 10 g twice daily and follow-up serum potassium tomorrow.  Otherwise continue supportive care.  Thanks for consultation.

## 2018-05-22 NOTE — Progress Notes (Signed)
Removed old wound dressing at this time, repacked tunneled wound (as ordered), and replaced wound foam dressing with a new one 1 day early. Dated and timed dressing. Patient tolerated well. Will continue to monitor wound dressing status. [name]

## 2018-05-22 NOTE — Progress Notes (Addendum)
[place] [facility] [place] NAME: Marcus Williams    MR#:  [id number]  DATE OF BIRTH:  December 18, 1955  SUBJECTIVE:  CHIEF COMPLAINT:   Chief Complaint  Patient presents with  . Shortness of Breath  Still quite short of breath and lower extremity swelling. Has worsening renal function.  REVIEW OF SYSTEMS:  Review of Systems  Constitutional: Negative for chills, fever and weight loss.  HENT: Negative for nosebleeds and sore throat.   Eyes: Negative for blurred vision.  Respiratory: Positive for shortness of breath. Negative for cough and wheezing.   Cardiovascular: Positive for leg swelling. Negative for chest pain, orthopnea and PND.  Gastrointestinal: Negative for abdominal pain, constipation, diarrhea, heartburn, nausea and vomiting.  Genitourinary: Negative for dysuria and urgency.  Musculoskeletal: Negative for back pain.  Skin: Negative for rash.  Neurological: Negative for dizziness, speech change, focal weakness and headaches.  Endo/Heme/Allergies: Does not bruise/bleed easily.  Psychiatric/Behavioral: Negative for depression.    DRUG ALLERGIES:   Allergies  Allergen Reactions  . Contrast Media [Iodinated Diagnostic Agents] Shortness Of Breath    Hypoxia/hypotension  . Versed [Midazolam] Shortness Of Breath and Other (See Comments)    Hypotension   VITALS:  Blood pressure 90/67, pulse 74, temperature 98.1 F (36.7 C), temperature source Oral, resp. rate 19, height 5\' 7"  (1.702 m), weight 110 kg, SpO2 96 %. PHYSICAL EXAMINATION:  Physical Exam  Constitutional: He is oriented to person, place, and time.  HENT:  Head: Normocephalic and atraumatic.  Eyes: Pupils are equal, round, and reactive to light. Conjunctivae and EOM are normal.  Neck: Normal range of motion. Neck supple. No tracheal deviation present. No thyromegaly present.  Cardiovascular: Normal rate, regular rhythm and normal heart sounds.  Pulmonary/Chest: Effort normal. No  respiratory distress. He has decreased breath sounds. He has no wheezes. He exhibits no tenderness.  Abdominal: Soft. Bowel sounds are normal. He exhibits no distension. There is no tenderness.  Musculoskeletal: Normal range of motion.       Right lower leg: He exhibits edema.       Left lower leg: He exhibits edema.  Neurological: He is alert and oriented to person, place, and time. No cranial nerve deficit.  Skin: Skin is warm and dry. No rash noted.  Has pressure ulcer on sacrum with dressing in place Has skin breakdown on both legs with some transparent dressing present and some small area of yellowish collection underneath the dressing both side.   LABORATORY PANEL:  Male CBC Recent Labs  Lab 05/21/18 0419  WBC 14.2*  HGB 10.0*  HCT 35.0*  PLT 291   ------------------------------------------------------------------------------------------------------------------ Chemistries  Recent Labs  Lab 05/19/18 0625  05/22/18 0402  NA 141   < > 138  K 4.5   < > 6.1*  CL 107   < > 103  CO2 25   < > 23  GLUCOSE 190*   < > 173*  BUN 13   < > 51*  CREATININE 0.66   < > 1.92*  CALCIUM 8.6*   < > 8.7*  AST 42*  --   --   ALT 141*  --   --   ALKPHOS 105  --   --   BILITOT 1.4*  --   --    < > = values in this interval not displayed.   RADIOLOGY:  Dg Chest 2 View  Result Date: 05/22/2018 CLINICAL DATA:  Hypoxia. EXAM: CHEST - 2 VIEW COMPARISON:  Chest x-ray dated May 19, 2018. FINDINGS: Stable cardiomegaly status post CABG. Mild pulmonary vascular congestion is unchanged. Unchanged left ventricular/aortic conduit in the left lower lobe. Unchanged left lower lobe atelectasis. No pleural effusion or pneumothorax. No acute osseous abnormality. IMPRESSION: 1. Stable cardiomegaly and pulmonary vascular congestion. 2. Unchanged postsurgical changes and atelectasis in the left lower lobe. Electronically Signed   By: Titus Dubin M.D.   On: 05/22/2018 11:08   US Renal  Result Date:  05/22/2018 CLINICAL DATA:  Acute renal failure. EXAM: RENAL / URINARY TRACT ULTRASOUND COMPLETE COMPARISON:  10/07/2017 FINDINGS: Right Kidney: Renal measurements: 12.7 x 6.6 x 6.3 cm = volume: 279.7 mL . Normal renal cortical thickness and echogenicity without focal lesions or hydronephrosis. Left Kidney: Renal measurements: 11.3 x 6.0 x 6.4 cm = volume: 220.2 mL. Normal renal cortical thickness and echogenicity without focal lesions or hydronephrosis. Bladder: Appears normal for degree of bladder distention. Other: Small amount of free fluid noted around the liver and spleen. IMPRESSION: 1. Normal sonographic appearance of both kidneys. No hydronephrosis. 2. Small amount of free fluid noted around the liver and spleen. Electronically Signed   By: Marijo Sanes M.D.   On: 05/22/2018 12:39   ASSESSMENT AND PLAN:  56 y m with acute hypoxic resp failure  * Acute Hypoxic Resp failure - due to COPD/CHF exacerbation  * Acute on chronic systolic dysfunction congestive heart failure with pulmonary edema  - continue aggressive Diuresis - Appreciate Cardio input, echo - EF 45-50% - renal func worse, stopped lasix.  * Ac renal failure-cardiorenal syndrome   Stop lasix, lisinopril, ultrasound renal without obstruction. Nephrology consult for further help.  *Diabetes mellitus -Add insulin Lantus, continue sliding scale -Diabetic nurse consult -Stop metformin due to renal failure.  * Elevated troponin - likely due to demand ischemia, monitor on tele  * Sinus tach: continue Metoprolol  *Pressure ulcer - on sacrum with stage 2 pressure injury Per wound care nurse which I agree with   Measurement: .2X.2X.1cm Wound bed: dark red Drainage (amount, consistency, odor) no odor or drainage Periwound: intact skin surrounding Wound care nurse recommends Foam dressing to protect from further injury.  - left chest wound - Chronic full thickness wound with packing; mod amt green-tinged drainage, some  odor.   .3X.3X3cm Surrounded by red macerated skin in the skin fold where it is frequently moist and rubs together; linear partial thickness fissure; .2X3X.1cm, red and moist, small amt tan drainage, no odor  Wound care nurse recommends - narrow strip of Aquacel and packed with a swab to absorb drainage and provide antimicrobial benefits.  Pt can resume previous plan of care with home health nursing after discharge.  * Hypertension   Hold lisinopril due ot renal failure  * Hyperkalemia   Stop oral K replacement. Give Lokelma as potassium more than 6 today.   All the records are reviewed and case discussed with Care Management/Social Worker. Management plans discussed with the patient, Nursing and they are in agreement.  CODE STATUS: Prior  TOTAL TIME TAKING CARE OF THIS PATIENT: 35 minutes.  Called wife's cell phone and left a voice mail.  More than 50% of the time was spent in counseling/coordination of care: YES  POSSIBLE D/C IN 1-2 DAYS, DEPENDING ON CLINICAL CONDITION.   Vaughan Basta M.D on 05/22/2018 at 4:17 PM  Between 7am to 6pm - Pager - [phone]  After 6pm go to [email or web address] - Proofreader  [employer]  [phone]  CC: Primary care physician; Idelle Crouch, MD  Note: This dictation was prepared with Dragon dictation along with smaller phrase technology. Any transcriptional errors that result from this process are unintentional.

## 2018-05-22 NOTE — Consult Note (Signed)
Reason for Consult: Central venous access for frequent blood sampling, pharmacologic medication administration with recently dislodged central line Referring Physician: Dr. Jefferson Fuel  HPI: [name] is a 62 year old gentleman with past medical history remarkable for obstructive sleep apnea, CVA, coronary artery disease, congestive heart failure with an ejection fraction of 35%, atrial fibrillation, status post aortic valve replacement at [facility], COPD, morbid obesity was admitted on 12/4 for increasing shortness of breath.  He has had 2 pillow orthopnea, exertional dyspnea, initially was started on BiPAP in the emergency department, was weaned to nasal cannula and admitted to the hospital service.  He was admitted for diuresis, he was seen by the cardiology service recommended medical management, was also seen in consultation with nephrology secondary to renal insufficiency with a BUN of 51/creatinine 1.92.  Potassium was 6.1.  Rapid response was called this evening secondary to bradycardia and hypotension.  Patient's pulse was in the 46E and his systolic blood pressures in the 80s with altered level of consciousness.  He was subsequently transferred to the intensive care unit, given multiple doses of atropine, externally paced however incomplete capture.  Cardiology was called and placed a right femoral central venous line previously.  Plan is for insulin, glucose, hydration, electrolyte administration as needed to drive his potassium down.  Unfortunately, he dislodged his right central venous access and I was called to replace this to facilitate medical therapy.  Past Medical History:  Diagnosis Date  . A-fib ([place])   . Aortic stenosis, moderate 11/2015  . Arthritis   . CAD (coronary artery disease)   . Chronic systolic CHF (congestive heart failure) ([place])   . COPD (chronic obstructive pulmonary disease) ([place])   . Diabetes mellitus without complication ([place])    INSULIN DEPENDENT  . GERD  (gastroesophageal reflux disease)   . History of cardioversion   . Hypertension   . MI (myocardial infarction) ([place])   . OSA on CPAP   . Sepsis ([place])   . Shortness of breath dyspnea   . Stroke ([place])   . SVT (supraventricular tachycardia) ([facility])     Past Surgical History:  Procedure Laterality Date  . 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 BUNIONECTOMY    . CARDIAC CATHETERIZATION N/A 10/02/2015   Procedure: Coronary/Grafts Angiography;  Surgeon: Teodoro Spray, MD;  Location: [place] [facility] LAB;  Service: Cardiovascular;  Laterality: N/A;  . CARDIAC CATHETERIZATION N/A 11/21/2015   Procedure: Right and Left Heart Cath;  Surgeon: Sherren Mocha, MD;  Location: [place] CV LAB;  Service: Cardiovascular;  Laterality: N/A;  . CORONARY ARTERY BYPASS GRAFT    . CORONARY STENT INTERVENTION N/A 07/30/2017   Procedure: CORONARY STENT INTERVENTION;  Surgeon: Isaias Cowman, MD;  Location: [place] CV LAB;  Service: Cardiovascular;  Laterality: N/A;  . CORONARY STENT INTERVENTION N/A 10/23/2017   Procedure: CORONARY STENT INTERVENTION;  Surgeon: Yolonda Kida, MD;  Location: [place] CV LAB;  Service: Cardiovascular;  Laterality: N/A;  . KNEE SURGERY    . LEFT HEART CATH AND CORONARY ANGIOGRAPHY N/A 10/30/2017   Procedure: LEFT HEART CATH AND CORONARY ANGIOGRAPHY;  Surgeon: Teodoro Spray, MD;  Location: [place] [facility] LAB;  Service: Cardiovascular;  Laterality: N/A;  . LEFT HEART CATH AND CORS/GRAFTS ANGIOGRAPHY N/A 10/22/2017   Procedure: LEFT HEART CATH AND CORS/GRAFTS ANGIOGRAPHY;  Surgeon: Corey Skains, MD;  Location: [facility] INVASIVE CV  LAB;  Service: Cardiovascular;  Laterality: N/A;  . quadruple bypass    . RIGHT/LEFT HEART CATH AND CORONARY/GRAFT ANGIOGRAPHY N/A 07/30/2017   Procedure: RIGHT/LEFT HEART CATH AND CORONARY/GRAFT ANGIOGRAPHY;  Surgeon:  Isaias Cowman, MD;  Location: [place] CV LAB;  Service: Cardiovascular;  Laterality: N/A;  . TEE WITHOUT CARDIOVERSION  11/21/2015  . TEE WITHOUT CARDIOVERSION N/A 11/21/2015   Procedure: TRANSESOPHAGEAL ECHOCARDIOGRAM (TEE);  Surgeon: Larey Dresser, MD;  Location: [facility] ENDOSCOPY;  Service: Cardiovascular;  Laterality: N/A;  . TRACHEOSTOMY      Family History  Problem Relation Age of Onset  . Stroke Mother   . Heart attack Mother   . Hypertension Mother   . Heart attack Father   . Hypertension Father   . Heart attack Brother        #1  . Diabetes Brother        #1  . Heart disease Brother        #2  . Lung disease Brother        #2  . Hypertension Brother        #2  . Diabetes Brother        #2    Social History:  reports that he has never smoked. He has never used smokeless tobacco. He reports that he does not drink alcohol or use drugs.  Allergies:  Allergies  Allergen Reactions  . Contrast Media [Iodinated Diagnostic Agents] Shortness Of Breath    Hypoxia/hypotension  . Versed [Midazolam] Shortness Of Breath and Other (See Comments)    Hypotension    Medications: I have reviewed the patient's current medications.  Results for orders placed or performed during the hospital encounter of 05/19/18 (from the past 48 hour(s))  Glucose, capillary     Status: Abnormal   Collection Time: 05/20/18  9:20 PM  Result Value Ref Range   Glucose-Capillary 143 (H) 70 - 99 mg/dL  Basic metabolic panel     Status: Abnormal   Collection Time: 05/21/18  4:19 AM  Result Value Ref Range   Sodium 138 135 - 145 mmol/L   Potassium 5.2 (H) 3.5 - 5.1 mmol/L   Chloride 105 98 - 111 mmol/L   CO2 25 22 - 32 mmol/L   Glucose, Bld 129 (H) 70 - 99 mg/dL   BUN 33 (H) 8 - 23 mg/dL   Creatinine, Ser 1.43 (H) 0.61 - 1.24 mg/dL   Calcium 8.6 (L) 8.9 - 10.3 mg/dL   GFR calc non Af Amer 52 (L) >60 mL/min   GFR calc Af Amer >60 >60 mL/min   Anion gap 8 5 - 15    Comment: Performed at  [facility], [address] [place], [place] [address]  CBC     Status: Abnormal   Collection Time: 05/21/18  4:19 AM  Result Value Ref Range   WBC 14.2 (H) 4.0 - 10.5 K/uL   RBC 4.28 4.22 - 5.81 MIL/uL   Hemoglobin 10.0 (L) 13.0 - 17.0 g/dL   HCT 35.0 (L) 39.0 - 52.0 %   MCV 81.8 80.0 - 100.0 fL   MCH 23.4 (L) 26.0 - 34.0 pg   MCHC 28.6 (L) 30.0 - 36.0 g/dL   RDW 18.7 (H) 11.5 - 15.5 %   Platelets 291 150 - 400 K/uL   nRBC 0.0 0.0 - 0.2 %    Comment: Performed at [facility], [address] [place], [place] [address]  Glucose, capillary     Status: Abnormal  Collection Time: 05/21/18  8:17 AM  Result Value Ref Range   Glucose-Capillary 127 (H) 70 - 99 mg/dL  Glucose, capillary     Status: Abnormal   Collection Time: 05/21/18 12:02 PM  Result Value Ref Range   Glucose-Capillary 126 (H) 70 - 99 mg/dL  Glucose, capillary     Status: None   Collection Time: 05/21/18  4:56 PM  Result Value Ref Range   Glucose-Capillary 94 70 - 99 mg/dL  Glucose, capillary     Status: Abnormal   Collection Time: 05/21/18  7:25 PM  Result Value Ref Range   Glucose-Capillary 59 (L) 70 - 99 mg/dL  Blood gas, arterial     Status: Abnormal   Collection Time: 05/21/18  7:36 PM  Result Value Ref Range   FIO2 0.32    Delivery systems NASAL CANNULA    pH, Arterial 7.33 (L) 7.350 - 7.450   pCO2 arterial 34 32.0 - 48.0 mmHg   pO2, Arterial 95 83.0 - 108.0 mmHg   Bicarbonate 17.9 (L) 20.0 - 28.0 mmol/L   Acid-base deficit 7.1 (H) 0.0 - 2.0 mmol/L   O2 Saturation 96.8 %   Patient temperature 37.0    Collection site RIGHT RADIAL    Sample type ARTERIAL DRAW    Allens test (pass/fail) PASS PASS    Comment: Performed at [facility], [address] [place], [place] [address]  Glucose, capillary     Status: Abnormal   Collection Time: 05/21/18  7:39 PM  Result Value Ref Range   Glucose-Capillary 46 (L) 70 - 99 mg/dL  Glucose, capillary     Status: None   Collection  Time: 05/21/18  8:01 PM  Result Value Ref Range   Glucose-Capillary 77 70 - 99 mg/dL  Glucose, capillary     Status: None   Collection Time: 05/21/18  8:49 PM  Result Value Ref Range   Glucose-Capillary 90 70 - 99 mg/dL  Glucose, capillary     Status: None   Collection Time: 05/21/18  9:37 PM  Result Value Ref Range   Glucose-Capillary 85 70 - 99 mg/dL  Glucose, capillary     Status: Abnormal   Collection Time: 05/22/18  3:35 AM  Result Value Ref Range   Glucose-Capillary 235 (H) 70 - 99 mg/dL  Basic metabolic panel     Status: Abnormal   Collection Time: 05/22/18  4:02 AM  Result Value Ref Range   Sodium 138 135 - 145 mmol/L   Potassium 6.1 (H) 3.5 - 5.1 mmol/L   Chloride 103 98 - 111 mmol/L   CO2 23 22 - 32 mmol/L   Glucose, Bld 173 (H) 70 - 99 mg/dL   BUN 51 (H) 8 - 23 mg/dL   Creatinine, Ser 1.92 (H) 0.61 - 1.24 mg/dL   Calcium 8.7 (L) 8.9 - 10.3 mg/dL   GFR calc non Af Amer 36 (L) >60 mL/min   GFR calc Af Amer 42 (L) >60 mL/min   Anion gap 12 5 - 15    Comment: Performed at [facility], [address] [place], [place] [address]  Glucose, capillary     Status: Abnormal   Collection Time: 05/22/18  7:37 AM  Result Value Ref Range   Glucose-Capillary 159 (H) 70 - 99 mg/dL  Glucose, capillary     Status: Abnormal   Collection Time: 05/22/18 11:42 AM  Result Value Ref Range   Glucose-Capillary 186 (H) 70 - 99 mg/dL  Blood gas, arterial  Status: None   Collection Time: 05/22/18 12:01 PM  Result Value Ref Range   FIO2 0.32    Delivery systems NASAL CANNULA    pH, Arterial 7.40 7.350 - 7.450   pCO2 arterial 39 32.0 - 48.0 mmHg   pO2, Arterial 106 83.0 - 108.0 mmHg   Bicarbonate 24.2 20.0 - 28.0 mmol/L   Acid-base deficit 0.5 0.0 - 2.0 mmol/L   O2 Saturation 98.1 %   Patient temperature 37.0    Collection site RIGHT BRACHIAL    Sample type ARTERIAL DRAW     Comment: Performed at [facility], [address] [place], [place] [address]   Glucose, capillary     Status: Abnormal   Collection Time: 05/22/18  4:57 PM  Result Value Ref Range   Glucose-Capillary 150 (H) 70 - 99 mg/dL  Comprehensive metabolic panel     Status: Abnormal   Collection Time: 05/22/18  6:52 PM  Result Value Ref Range   Sodium 138 135 - 145 mmol/L   Potassium 5.6 (H) 3.5 - 5.1 mmol/L   Chloride 103 98 - 111 mmol/L   CO2 24 22 - 32 mmol/L   Glucose, Bld 245 (H) 70 - 99 mg/dL   BUN 55 (H) 8 - 23 mg/dL   Creatinine, Ser 2.01 (H) 0.61 - 1.24 mg/dL   Calcium 7.4 (L) 8.9 - 10.3 mg/dL   Total Protein 5.7 (L) 6.5 - 8.1 g/dL   Albumin 2.8 (L) 3.5 - 5.0 g/dL   AST 39 15 - 41 U/L   ALT 68 (H) 0 - 44 U/L   Alkaline Phosphatase 76 38 - 126 U/L   Total Bilirubin 1.1 0.3 - 1.2 mg/dL   GFR calc non Af Amer 35 (L) >60 mL/min   GFR calc Af Amer 40 (L) >60 mL/min   Anion gap 11 5 - 15    Comment: Performed at [facility], [address] [place], [place] [address]  CBC with Differential/Platelet     Status: Abnormal   Collection Time: 05/22/18  6:52 PM  Result Value Ref Range   WBC 9.0 4.0 - 10.5 K/uL   RBC 3.84 (L) 4.22 - 5.81 MIL/uL   Hemoglobin 8.9 (L) 13.0 - 17.0 g/dL   HCT 30.3 (L) 39.0 - 52.0 %   MCV 78.9 (L) 80.0 - 100.0 fL   MCH 23.2 (L) 26.0 - 34.0 pg   MCHC 29.4 (L) 30.0 - 36.0 g/dL   RDW 18.5 (H) 11.5 - 15.5 %   Platelets 288 150 - 400 K/uL   nRBC 0.2 0.0 - 0.2 %   Neutrophils Relative % 81 %   Neutro Abs 7.2 1.7 - 7.7 K/uL   Lymphocytes Relative 11 %   Lymphs Abs 1.0 0.7 - 4.0 K/uL   Monocytes Relative 8 %   Monocytes Absolute 0.7 0.1 - 1.0 K/uL   Eosinophils Relative 0 %   Eosinophils Absolute 0.0 0.0 - 0.5 K/uL   Basophils Relative 0 %   Basophils Absolute 0.0 0.0 - 0.1 K/uL   Immature Granulocytes 0 %   Abs Immature Granulocytes 0.04 0.00 - 0.07 K/uL    Comment: Performed at [facility], [address] [place], [place] [address]  Magnesium     Status: None   Collection Time: 05/22/18  6:52 PM  Result Value  Ref Range   Magnesium 1.8 1.7 - 2.4 mg/dL    Comment: Performed at [facility], [address] [place], [place] [address]  Troponin I - Now Then Q6H     Status: Abnormal   Collection Time: 05/22/18  6:52 PM  Result Value Ref Range   Troponin I 0.04 (HH) <0.03 ng/mL    Comment: CRITICAL RESULT CALLED TO, READ BACK BY AND VERIFIED WITH: TONY WALKER @1945  05/22/2018 TTG  Performed at [facility], [address] [place], [place] [address]   Protime-INR     Status: Abnormal   Collection Time: 05/22/18  6:52 PM  Result Value Ref Range   Prothrombin Time 31.0 (H) 11.4 - 15.2 seconds   INR 3.04     Comment: Performed at [facility], [address] [place], [place] [address]  Glucose, capillary     Status: Abnormal   Collection Time: 05/22/18  7:28 PM  Result Value Ref Range   Glucose-Capillary 230 (H) 70 - 99 mg/dL    Dg Chest 2 View  Result Date: 05/22/2018 CLINICAL DATA:  Hypoxia. EXAM: CHEST - 2 VIEW COMPARISON:  Chest x-ray dated May 19, 2018. FINDINGS: Stable cardiomegaly status post CABG. Mild pulmonary vascular congestion is unchanged. Unchanged left ventricular/aortic conduit in the left lower lobe. Unchanged left lower lobe atelectasis. No pleural effusion or pneumothorax. No acute osseous abnormality. IMPRESSION: 1. Stable cardiomegaly and pulmonary vascular congestion. 2. Unchanged postsurgical changes and atelectasis in the left lower lobe. Electronically Signed   By: Titus Dubin M.D.   On: 05/22/2018 11:08   US Renal  Result Date: 05/22/2018 CLINICAL DATA:  Acute renal failure. EXAM: RENAL / URINARY TRACT ULTRASOUND COMPLETE COMPARISON:  10/07/2017 FINDINGS: Right Kidney: Renal measurements: 12.7 x 6.6 x 6.3 cm = volume: 279.7 mL . Normal renal cortical thickness and echogenicity without focal lesions or hydronephrosis. Left Kidney: Renal measurements: 11.3 x 6.0 x 6.4 cm = volume: 220.2 mL. Normal renal cortical thickness and echogenicity  without focal lesions or hydronephrosis. Bladder: Appears normal for degree of bladder distention. Other: Small amount of free fluid noted around the liver and spleen. IMPRESSION: 1. Normal sonographic appearance of both kidneys. No hydronephrosis. 2. Small amount of free fluid noted around the liver and spleen. Electronically Signed   By: Marijo Sanes M.D.   On: 05/22/2018 12:39    ROS Blood pressure (!) 92/57, pulse (!) 41, temperature 97.7 F (36.5 C), temperature source Axillary, resp. rate (!) 26, height 5\' 7"  (1.702 m), weight 110 kg, SpO2 100 %. Physical Exam  Constitutional: Arousable, but somewhat agitated. HEENT: PERRLA, no scleral icterus Neck: Some accessory muscle use, JVD present Skin: Somewhat diaphoretic, no cyanosis. Abdomen: Obese, pendulous, no gross hernia, no peritonitis or irritation Lower extremities: Warm and well perfused, toe amputation on the right Circulatory: Bilateral femoral pulses 2+.  Assessment/Plan: At this point time we will plan for replacement of his central venous access with ultrasound guidance given his size, and the difficulty with previous access placement.  Hildagard Sobecki A Montell Leopard 05/22/2018, 8:01 PM

## 2018-05-22 NOTE — Progress Notes (Signed)
PT Cancellation Note  Patient Details Name: Marcus Williams MRN: [id number] DOB: 07/30/1955   Cancelled Treatment:    Reason Eval/Treat Not Completed: Medical issues which prohibited therapy(Per chart review, patient transferred to higher level of care due to presistent bradycardia and possible temp pacer wires.  Per guidelines, will require  new orders to resume care.  Please reconsult as appropriate.)   Marionna Gonia H. Owens Shark, PT, DPT, NCS 05/22/18, 11:12 PM [phone]

## 2018-05-23 LAB — BASIC METABOLIC PANEL
Anion gap: 10 (ref 5–15)
Anion gap: 9 (ref 5–15)
BUN: 64 mg/dL — ABNORMAL HIGH (ref 8–23)
BUN: 58 mg/dL — ABNORMAL HIGH (ref 8–23)
CO2: 27 mmol/L (ref 22–32)
CO2: 26 mmol/L (ref 22–32)
Calcium: 8.2 mg/dL — ABNORMAL LOW (ref 8.9–10.3)
Calcium: 8.2 mg/dL — ABNORMAL LOW (ref 8.9–10.3)
Chloride: 103 mmol/L (ref 98–111)
Chloride: 98 mmol/L (ref 98–111)
Creatinine, Ser: 2.09 mg/dL — ABNORMAL HIGH (ref 0.61–1.24)
Creatinine, Ser: 1.71 mg/dL — ABNORMAL HIGH (ref 0.61–1.24)
GFR calc Af Amer: 49 mL/min — ABNORMAL LOW (ref >60)
GFR calc non Af Amer: 33 mL/min — ABNORMAL LOW (ref >60)
GFR calc non Af Amer: 42 mL/min — ABNORMAL LOW (ref >60)
GFR, EST AFRICAN AMERICAN: 38 mL/min — AB (ref >60)
Glucose, Bld: 140 mg/dL — ABNORMAL HIGH (ref 70–99)
Glucose, Bld: 167 mg/dL — ABNORMAL HIGH (ref 70–99)
Potassium: 5.3 mmol/L — ABNORMAL HIGH (ref 3.5–5.1)
Potassium: 4.5 mmol/L (ref 3.5–5.1)
Sodium: 140 mmol/L (ref 135–145)
Sodium: 133 mmol/L — ABNORMAL LOW (ref 135–145)

## 2018-05-23 LAB — CBC
HCT: 31 % — ABNORMAL LOW (ref 39.0–52.0)
Hemoglobin: 9.2 g/dL — ABNORMAL LOW (ref 13.0–17.0)
MCH: 23.1 pg — ABNORMAL LOW (ref 26.0–34.0)
MCHC: 29.7 g/dL — ABNORMAL LOW (ref 30.0–36.0)
MCV: 77.9 fL — ABNORMAL LOW (ref 80.0–100.0)
Platelets: 269 10*3/uL (ref 150–400)
RBC: 3.98 MIL/uL — ABNORMAL LOW (ref 4.22–5.81)
RDW: 18.3 % — AB (ref 11.5–15.5)
WBC: 11.1 10*3/uL — ABNORMAL HIGH (ref 4.0–10.5)
nRBC: 0 % (ref 0.0–0.2)

## 2018-05-23 LAB — GLUCOSE, CAPILLARY
Glucose-Capillary: 135 mg/dL — ABNORMAL HIGH (ref 70–99)
Glucose-Capillary: 114 mg/dL — ABNORMAL HIGH (ref 70–99)
Glucose-Capillary: 216 mg/dL — ABNORMAL HIGH (ref 70–99)
Glucose-Capillary: 205 mg/dL — ABNORMAL HIGH (ref 70–99)
Glucose-Capillary: 160 mg/dL — ABNORMAL HIGH (ref 70–99)

## 2018-05-23 LAB — LACTIC ACID, PLASMA: LACTIC ACID, VENOUS: 1.5 mmol/L (ref 0.5–1.9)

## 2018-05-23 LAB — PROTIME-INR
INR: 2.53
INR: 1.93
Prothrombin Time: 26.9 seconds — ABNORMAL HIGH (ref 11.4–15.2)
Prothrombin Time: 21.8 seconds — ABNORMAL HIGH (ref 11.4–15.2)

## 2018-05-23 LAB — MAGNESIUM: Magnesium: 1.9 mg/dL (ref 1.7–2.4)

## 2018-05-23 LAB — TROPONIN I
Troponin I: 0.04 ng/mL (ref <0.03)
Troponin I: 0.05 ng/mL (ref <0.03)
Troponin I: 0.05 ng/mL (ref <0.03)

## 2018-05-23 LAB — PHOSPHORUS: Phosphorus: 4 mg/dL (ref 2.5–4.6)

## 2018-05-23 MED ORDER — METOPROLOL TARTRATE 25 MG PO TABS
25.0000 mg | ORAL_TABLET | Freq: Two times a day (BID) | ORAL | Status: DC
  Administered 2018-05-23 – 2018-06-01 (×19): 25 mg via ORAL
  Filled 2018-05-23 (×19): qty 1

## 2018-05-23 MED ORDER — INSULIN REGULAR HUMAN 100 UNIT/ML IJ SOLN
10.0000 [IU] | Freq: Once | INTRAMUSCULAR | Status: AC
  Administered 2018-05-23: 10 [IU] via INTRAVENOUS
  Filled 2018-05-23: qty 10

## 2018-05-23 MED ORDER — FENTANYL CITRATE (PF) 100 MCG/2ML IJ SOLN: 12.5000 ug | Freq: Four times a day (QID) | INTRAMUSCULAR | Status: DC | PRN

## 2018-05-23 MED ORDER — DEXTROSE 50 % IV SOLN
50.0000 mL | Freq: Once | INTRAVENOUS | Status: AC
  Administered 2018-05-23: 50 mL via INTRAVENOUS
  Filled 2018-05-23: qty 50

## 2018-05-23 NOTE — Progress Notes (Signed)
Follow up - Critical Care Medicine Note  Patient Details:    Marcus Williams is an 62 y.o. male. with past medical history remarkable for obstructive sleep apnea, CVA, coronary artery disease, congestive heart failure with an ejection fraction of 35%, atrial fibrillation, status post aortic valve replacement at [facility], COPD, morbid obesity was admitted on 12/4 for increasing shortness of breath.  He has had 2 pillow orthopnea, exertional dyspnea, initially was started on BiPAP in the emergency department, was weaned to nasal cannula and admitted to the hospital service.  He was admitted for diuresis, he was seen by the cardiology service recommended medical management, was also seen in consultation with nephrology secondary to renal insufficiency with a BUN of 51/creatinine 1.92.  Potassium was 6.1.  Rapid response was called secondary to bradycardia and hypotension.  Patient's pulse was in the 02V and his systolic blood pressures in the 80s with altered level of consciousness.  He was subsequently transferred to the intensive care unit, given multiple doses of atropine, externally paced however incomplete capture.  Cardiology was called   Lines, Airways, Drains: CVC Triple Lumen 05/22/18 Right Femoral (Active)  Indication for Insertion or Continuance of Line Vasoactive infusions;Chronic illness with exacerbations (CF, Sickle Cell, etc.) 05/22/2018  9:56 PM  Site Assessment Dry;Intact;Clean 05/22/2018  9:56 PM  Proximal Lumen Status Flushed;Blood return noted 05/22/2018  9:56 PM  Medial Lumen Status Flushed;Blood return noted 05/22/2018  9:56 PM  Distal Lumen Status Flushed;Blood return noted 05/22/2018  9:56 PM  Dressing Type Transparent;Occlusive 05/22/2018  9:56 PM  Dressing Status Clean;Dry;Intact;Antimicrobial disc in place 05/22/2018  9:56 PM  Line Care Connections checked and tightened 05/22/2018  9:56 PM  Dressing Change Due 05/29/18 05/22/2018  9:56 PM     Urethral Catheter Skeet Latch,  RN Double-lumen;Non-latex 16 Fr. (Active)  Indication for Insertion or Continuance of Catheter Unstable critical patients (first 24-48 hours) 05/23/2018 12:00 AM  Site Assessment Clean;Intact;Dry 05/23/2018 12:00 AM  Catheter Maintenance Bag below level of bladder;Catheter secured;Drainage bag/tubing not touching floor;Insertion date on drainage bag;No dependent loops;Seal intact 05/23/2018 12:00 AM  Collection Container Dedicated Suction Canister 05/23/2018 12:00 AM  Securement Method Securing device (Describe) 05/23/2018 12:00 AM  Urinary Catheter Interventions Unclamped 05/23/2018 12:00 AM  Output (mL) 200 mL 05/23/2018  5:00 AM    Anti-infectives:  Anti-infectives (From admission, onward)   None      Microbiology: Results for orders placed or performed during the hospital encounter of 05/19/18  MRSA PCR Screening     Status: None   Collection Time: 05/22/18  8:19 PM  Result Value Ref Range Status   MRSA by PCR NEGATIVE NEGATIVE Final    Comment:        The GeneXpert MRSA Assay (FDA approved for NASAL specimens only), is one component of a comprehensive MRSA colonization surveillance program. It is not intended to diagnose MRSA infection nor to guide or monitor treatment for MRSA infections. Performed at [facility], [address], [place], [place] [address]     Studies: Dg Chest 2 View  Result Date: 05/22/2018 CLINICAL DATA:  Hypoxia. EXAM: CHEST - 2 VIEW COMPARISON:  Chest x-ray dated May 19, 2018. FINDINGS: Stable cardiomegaly status post CABG. Mild pulmonary vascular congestion is unchanged. Unchanged left ventricular/aortic conduit in the left lower lobe. Unchanged left lower lobe atelectasis. No pleural effusion or pneumothorax. No acute osseous abnormality. IMPRESSION: 1. Stable cardiomegaly and pulmonary vascular congestion. 2. Unchanged postsurgical changes and atelectasis in the left lower lobe.  Electronically Signed   By: Titus Dubin M.D.   On:  05/22/2018 11:08   Dg Chest 2 View  Result Date: 05/06/2018 CLINICAL DATA:  Shortness of breath. EXAM: CHEST - 2 VIEW COMPARISON:  CT 04/28/2018. chest x-ray 04/28/2018, 12/03/2017. FINDINGS: Prior CABG. Stable fracture of upper median sternotomy wire. Cardiomegaly again noted. Aortic contour, this is best identified by prior CT. Moderate bilateral pulmonary infiltrates/edema. Atelectatic changes left lung base. No pneumothorax. Previously identified rib fractures best identified by prior CT. IMPRESSION: 1. Prior CABG. Aortic conduit, this is best identified by prior CT. Cardiomegaly again noted. Moderate bilateral pulmonary infiltrates/edema. A component of CHF may be present. 2.  Atelectatic changes left lung base. Electronically Signed   By: [place]   On: 05/06/2018 06:30   Dg Chest 2 View  Result Date: 04/28/2018 CLINICAL DATA:  Shortness of breath. EXAM: CHEST - 2 VIEW COMPARISON:  Radiograph of December 03, 2017. FINDINGS: Stable cardiomegaly. Status post coronary artery bypass graft. No pneumothorax is noted. New left perihilar and basilar opacities are noted concerning for edema or infiltrate. Small left pleural effusion cannot be excluded. Right midlung subsegmental atelectasis or scarring is noted. Bony thorax is unremarkable. IMPRESSION: New left perihilar and basilar opacities are noted concerning for edema or infiltrate. Small left pleural effusion cannot be excluded. Electronically Signed   By: Marijo Conception, M.D.   On: 04/28/2018 13:13   Ct Chest Wo Contrast  Result Date: 04/28/2018 CLINICAL DATA:  Increasing shortness of breath since a fall left night onto his left side. Abnormal chest radiograph. EXAM: CT CHEST WITHOUT CONTRAST TECHNIQUE: Multidetector CT imaging of the chest was performed following the standard protocol without IV contrast. COMPARISON:  Chest x-rays dated 04/28/2018 and 12/03/2017 and chest CT 03/24/2017 FINDINGS: Cardiovascular: Cardiomegaly. Left ventricular  apical-aortic conduit in place with the valve in the conduit. The conduit extends from the left ventricular apex to the descending thoracic aorta. Extensive coronary artery calcification. Extensive aortic valvular calcification. Aortic atherosclerosis. CABG. No pericardial effusion. Pulmonary vascularity appears normal. Mediastinum/Nodes: No enlarged mediastinal or axillary lymph nodes. Thyroid gland, trachea, and esophagus demonstrate no significant findings. Lungs/Pleura: There is a small amount of fluid loculated in the superior aspect of left major fissure. Chronic pleural thickening and slight atelectasis at the left lung base. No pneumothorax. Upper Abdomen: No acute abnormality. Musculoskeletal: There are multiple old left rib fractures. There is a subacute fracture of the posterolateral aspect of the left fifth rib. There are fractures of anterior costal cartilages of the left third, fourth, fifth and sixth ribs. There is extensive ill-defined soft tissue stranding in the subcutaneous fat of the lateral aspect of the left side of the chest. This may be due to the prior chest surgery but I cannot exclude cellulitis. No definable hematoma or abscess. This could also represent chest wall contusion from the patient's recent fall. IMPRESSION: 1. Soft tissue stranding in the subcutaneous fat of the left lateral chest wall which could represent soft tissue contusion or cellulitis or postsurgical changes from recent chest surgery. 2. Multiple old and subacute left rib fractures. 3. Fractures of the anterior costal cartilages of multiple left ribs. 4. Left ventricular apex to descending thoracic aorta conduit with valve. Electronically Signed   By: Lorriane Shire M.D.   On: 04/28/2018 14:40   US Renal  Result Date: 05/22/2018 CLINICAL DATA:  Acute renal failure. EXAM: RENAL / URINARY TRACT ULTRASOUND COMPLETE COMPARISON:  10/07/2017 FINDINGS: Right Kidney: Renal measurements: 12.7 x 6.6  x 6.3 cm = volume: 279.7  mL . Normal renal cortical thickness and echogenicity without focal lesions or hydronephrosis. Left Kidney: Renal measurements: 11.3 x 6.0 x 6.4 cm = volume: 220.2 mL. Normal renal cortical thickness and echogenicity without focal lesions or hydronephrosis. Bladder: Appears normal for degree of bladder distention. Other: Small amount of free fluid noted around the liver and spleen. IMPRESSION: 1. Normal sonographic appearance of both kidneys. No hydronephrosis. 2. Small amount of free fluid noted around the liver and spleen. Electronically Signed   By: Marijo Sanes M.D.   On: 05/22/2018 12:39   Dg Chest Port 1 View  Result Date: 05/19/2018 CLINICAL DATA:  62 year old male with shortness of breath EXAM: PORTABLE CHEST 1 VIEW COMPARISON:  Head chest radiograph dated 05/06/2018 and CT dated 04/28/2018 FINDINGS: There is stable cardiomegaly. Mild bilateral perihilar vascular prominence, likely mild vascular congestion. Increased density at the left lung base likely combination of atelectasis and postsurgical changes related to left ventricular/aortic conduit seen on the CT. There is no pleural effusion or pneumothorax. Median sternotomy wires. No acute osseous pathology. IMPRESSION: Cardiomegaly with mild vascular congestion. Probable atelectasis or postsurgical changes of the left lung base. Electronically Signed   By: Anner Crete M.D.   On: 05/19/2018 06:48    Consults: Treatment Team:  Corey Skains, MD Anthonette Legato, MD Isaias Cowman, MD Shelda Altes, MD   Subjective:    Overnight Issues: Patient resting comfortably on BiPAP.  At bedside transcutaneous pacemaker was still on.  Shadad off to evaluate patient's intrinsic rhythm which was now in the 71s.  He did have episodes of bigeminy.  Transcutaneous pacemaker on hold.  Is on dopamine for pressure and pulse support  Objective:  Vital signs for last 24 hours: Temp:  [97.7 F (36.5 C)-99.9 F (37.7 C)] 99.5 F (37.5 C)  (12/08 0430) Pulse Rate:  [41-85] 66 (12/08 0645) Resp:  [15-30] 17 (12/08 0645) BP: (90-131)/(53-86) 109/64 (12/08 0645) SpO2:  [90 %-100 %] 98 % (12/08 0645) FiO2 (%):  [100 %] 100 % (12/08 0600) Weight:  [121.5 kg] 121.5 kg (12/08 0500)  Hemodynamic parameters for last 24 hours:    Intake/Output from previous day: 12/07 0701 - 12/08 0700 In: 338.4 [P.O.:120; I.V.:175.7; IV Piggyback:42.7] Out: 895 [Urine:895]  Intake/Output this shift: No intake/output data recorded.  Vent settings for last 24 hours: FiO2 (%):  [100 %] 100 %  Physical Exam:  Vital signs:        Bradycardia has improved, intrinsic pulse rate is sinus at 60 with occasional bigeminy ending morning electrolytes HEENT:           Trachea is midline, no thyromegaly appreciated, morbidly obese neck Cardiovascular:           Bradycardia Pulmonary:      Clear to auscultation.  He does have a bandage and packing from a chronic left chest wound Abdominal:      Morbid obesity Extremities:     Chronic venous stasis and edema Neurologic:      Intermittently responsive but easily arousable, moves all extremities   Assessment/Plan:   Bradycardia/hypotension.  Patient has done well overnight.  Transcutaneous pacing stopped.  Patient is presently on dopamine with stable pulse and blood pressure.  Appreciate cardiology input  Congestive heart failure.  Per cardiology last EF 35%.  Status post aortic valve replacement  Acute renal failure.  Being seen by nephrology, this morning his labs reveal a BUN 64/creatinine 2.09, potassium 5.3.  Hyperkalemia.  Was 5.5, now 5.3, being followed by nephrology  Diabetes mellitus.  Sugar 140  Leukocytosis.  11.1, no clear evidence of active infection  Anemia.  No evidence of active bleeding  Hermelinda Dellen, DO  Critical Care Total Time 30 minutes  Nishika Parkhurst 05/23/2018  *Care during the described time interval was provided by me and/or other providers on the critical care  team.  I have reviewed this patient's available data, including medical history, events of note, physical examination and test results as part of my evaluation.

## 2018-05-23 NOTE — Progress Notes (Signed)
Right femoral triple-lumen catheter functioning without incident. PICC line may well be a consideration in this gentleman to allow removal of his femoral central line in a timely manner.  Please recontact [place] as needed.  Zara Chess, MD VS Coverage

## 2018-05-23 NOTE — Progress Notes (Signed)
[facility] Cardiology  SUBJECTIVE: Patient laying in bed, denies chest pain or shortness of breath   Vitals:   05/23/18 0645 05/23/18 0700 05/23/18 0753 05/23/18 0800  BP: 109/64 115/66  127/72  Pulse: 66 67  69  Resp: 17 20  (!) 21  Temp:   98.5 F (36.9 C)   TempSrc:   Oral   SpO2: 98% 100%  100%  Weight:      Height:         Intake/Output Summary (Last 24 hours) at 05/23/2018 0488 Last data filed at 05/23/2018 0800 Gross per 24 hour  Intake 578.38 ml  Output 1005 ml  Net -426.62 ml      PHYSICAL EXAM  General: Well developed, well nourished, in no acute distress HEENT:  Normocephalic and atramatic Neck:  No JVD.  Lungs: Clear bilaterally to auscultation and percussion. Heart: HRRR . Normal S1 and S2 without gallops or murmurs.  Abdomen: Bowel sounds are positive, abdomen soft and non-tender  Msk:  Back normal, normal gait. Normal strength and tone for age. Extremities: No clubbing, cyanosis or edema.   Neuro: Alert and oriented X 3. Psych:  Good affect, responds appropriately   LABS: Basic Metabolic Panel: Recent Labs    05/22/18 1852 05/22/18 2142 05/23/18 0503  NA 138 137 140  K 5.6* 5.5* 5.3*  CL 103 99 103  CO2 24 26 27   GLUCOSE 245* 186* 140*  BUN 55* 61* 64*  CREATININE 2.01* 2.11* 2.09*  CALCIUM 7.4* 8.5* 8.2*  MG 1.8  --   --   PHOS  --  6.0*  6.1*  --    Liver Function Tests: Recent Labs    05/22/18 1852 05/22/18 2142  AST 39  --   ALT 68*  --   ALKPHOS 76  --   BILITOT 1.1  --   PROT 5.7*  --   ALBUMIN 2.8* 3.2*   No results for input(s): LIPASE, AMYLASE in the last 72 hours. CBC: Recent Labs    05/22/18 1852 05/23/18 0503  WBC 9.0 11.1*  NEUTROABS 7.2  --   HGB 8.9* 9.2*  HCT 30.3* 31.0*  MCV 78.9* 77.9*  PLT 288 269   Cardiac Enzymes: Recent Labs    05/22/18 1852 05/22/18 2221 05/23/18 0031  TROPONINI 0.04* 0.05* 0.04*   BNP: Invalid input(s): POCBNP D-Dimer: No results for input(s): DDIMER in the last 72  hours. Hemoglobin A1C: No results for input(s): HGBA1C in the last 72 hours. Fasting Lipid Panel: No results for input(s): CHOL, HDL, LDLCALC, TRIG, CHOLHDL, LDLDIRECT in the last 72 hours. Thyroid Function Tests: No results for input(s): TSH, T4TOTAL, T3FREE, THYROIDAB in the last 72 hours.  Invalid input(s): FREET3 Anemia Panel: No results for input(s): VITAMINB12, FOLATE, FERRITIN, TIBC, IRON, RETICCTPCT in the last 72 hours.  Dg Chest 2 View  Result Date: 05/22/2018 CLINICAL DATA:  Hypoxia. EXAM: CHEST - 2 VIEW COMPARISON:  Chest x-ray dated May 19, 2018. FINDINGS: Stable cardiomegaly status post CABG. Mild pulmonary vascular congestion is unchanged. Unchanged left ventricular/aortic conduit in the left lower lobe. Unchanged left lower lobe atelectasis. No pleural effusion or pneumothorax. No acute osseous abnormality. IMPRESSION: 1. Stable cardiomegaly and pulmonary vascular congestion. 2. Unchanged postsurgical changes and atelectasis in the left lower lobe. Electronically Signed   By: Titus Dubin M.D.   On: 05/22/2018 11:08   US Renal  Result Date: 05/22/2018 CLINICAL DATA:  Acute renal failure. EXAM: RENAL / URINARY TRACT ULTRASOUND COMPLETE COMPARISON:  10/07/2017 FINDINGS:  Right Kidney: Renal measurements: 12.7 x 6.6 x 6.3 cm = volume: 279.7 mL . Normal renal cortical thickness and echogenicity without focal lesions or hydronephrosis. Left Kidney: Renal measurements: 11.3 x 6.0 x 6.4 cm = volume: 220.2 mL. Normal renal cortical thickness and echogenicity without focal lesions or hydronephrosis. Bladder: Appears normal for degree of bladder distention. Other: Small amount of free fluid noted around the liver and spleen. IMPRESSION: 1. Normal sonographic appearance of both kidneys. No hydronephrosis. 2. Small amount of free fluid noted around the liver and spleen. Electronically Signed   By: Marijo Sanes M.D.   On: 05/22/2018 12:39     Echo LVEF 45 to 50%  TELEMETRY: Sinus  rhythm 70 bpm:  ASSESSMENT AND PLAN:  Active Problems:   Acute respiratory failure with hypoxemia (HCC)   Pressure injury of skin    1.  Transient bradycardia, with junctional bradycardia in the 50s, the setting of acute renal failure, hyperkalemia, required temporary transcutaneous pacing dopamine, now resolved 2.  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  Known CAD, status post CABG, status post DES SVG to RCA 10/23/2017 4.  Paroxysmal atrial fibrillation, on Eliquis for stroke prevention  Recommendations  1.  Agree with overall current therapy 2.  Resume metoprolol tartrate at lower dose 3.  Transfer to telemetry 4.  Continue Plavix to prevent in-stent thrombosis 5.  Continue Eliquis to prevent stroke 6.  No indication at this time for permanent pacemaker    Isaias Cowman, MD, PhD, [facility] 05/23/2018 8:38 AM

## 2018-05-23 NOTE — Progress Notes (Signed)
[place] [facility] [place] NAME: Marcus Williams    MR#:  [id number]  DATE OF BIRTH:  1955/10/23  SUBJECTIVE:  CHIEF COMPLAINT:   Chief Complaint  Patient presents with  . Shortness of Breath   Still quite short of breath and lower extremity swelling. Has worsening renal function. Had bradycardia and hypotension, sent to ICU. After correction of potassium, stable now.  REVIEW OF SYSTEMS:  Review of Systems  Constitutional: Negative for chills, fever and weight loss.  HENT: Negative for nosebleeds and sore throat.   Eyes: Negative for blurred vision.  Respiratory: Positive for shortness of breath. Negative for cough and wheezing.   Cardiovascular: Positive for leg swelling. Negative for chest pain, orthopnea and PND.  Gastrointestinal: Negative for abdominal pain, constipation, diarrhea, heartburn, nausea and vomiting.  Genitourinary: Negative for dysuria and urgency.  Musculoskeletal: Negative for back pain.  Skin: Negative for rash.  Neurological: Negative for dizziness, speech change, focal weakness and headaches.  Endo/Heme/Allergies: Does not bruise/bleed easily.  Psychiatric/Behavioral: Negative for depression.    DRUG ALLERGIES:   Allergies  Allergen Reactions  . Contrast Media [Iodinated Diagnostic Agents] Shortness Of Breath    Hypoxia/hypotension  . Versed [Midazolam] Shortness Of Breath and Other (See Comments)    Hypotension   VITALS:  Blood pressure 132/68, pulse 69, temperature (!) 97.4 F (36.3 C), temperature source Axillary, resp. rate 20, height 5\' 7"  (1.702 m), weight 121.5 kg, SpO2 94 %. PHYSICAL EXAMINATION:  Physical Exam  Constitutional: He is oriented to person, place, and time.  HENT:  Head: Normocephalic and atraumatic.  Eyes: Pupils are equal, round, and reactive to light. Conjunctivae and EOM are normal.  Neck: Normal range of motion. Neck supple. No tracheal deviation present. No thyromegaly present.    Cardiovascular: Normal rate, regular rhythm and normal heart sounds.  Pulmonary/Chest: Effort normal. No respiratory distress. He has decreased breath sounds. He has no wheezes. He exhibits no tenderness.  Abdominal: Soft. Bowel sounds are normal. He exhibits no distension. There is no tenderness.  Musculoskeletal: Normal range of motion.       Right lower leg: He exhibits edema.       Left lower leg: He exhibits edema.  Neurological: He is alert and oriented to person, place, and time. No cranial nerve deficit.  Skin: Skin is warm and dry. No rash noted.  Has pressure ulcer on sacrum with dressing in place Has skin breakdown on both legs with some transparent dressing present and some small area of yellowish collection underneath the dressing both side.   LABORATORY PANEL:  Male CBC Recent Labs  Lab 05/23/18 0503  WBC 11.1*  HGB 9.2*  HCT 31.0*  PLT 269   ------------------------------------------------------------------------------------------------------------------ Chemistries  Recent Labs  Lab 05/22/18 1852  05/23/18 0503  NA 138   < > 140  K 5.6*   < > 5.3*  CL 103   < > 103  CO2 24   < > 27  GLUCOSE 245*   < > 140*  BUN 55*   < > 64*  CREATININE 2.01*   < > 2.09*  CALCIUM 7.4*   < > 8.2*  MG 1.8  --   --   AST 39  --   --   ALT 68*  --   --   ALKPHOS 76  --   --   BILITOT 1.1  --   --    < > = values in this interval  not displayed.   RADIOLOGY:  No results found. ASSESSMENT AND PLAN:  19 y m with acute hypoxic resp failure  * Acute Hypoxic Resp failure - due to COPD/CHF exacerbation  * hyperkalemia   Lokelma, nephrologist on case.  * Bradycardia, Hypotension  required temporary pacer and dopamine, Improved.  * Acute on chronic systolic dysfunction congestive heart failure with pulmonary edema  - continue aggressive Diuresis - Appreciate Cardio input, echo - EF 45-50% - renal func worse, stopped lasix.  * Ac renal failure-cardiorenal syndrome    Stop lasix, lisinopril, ultrasound renal without obstruction. Nephrology consult for further help.  *Diabetes mellitus -Add insulin Lantus, continue sliding scale -Diabetic nurse consult -Stop metformin due to renal failure.  * Elevated troponin - likely due to demand ischemia, monitor on tele  * Sinus tach: continue Metoprolol  *Pressure ulcer - on sacrum with stage 2 pressure injury Per wound Marcus nurse which I agree with   Measurement: .2X.2X.1cm Wound bed: dark red Drainage (amount, consistency, odor) no odor or drainage Periwound: intact skin surrounding Wound Marcus nurse recommends Foam dressing to protect from further injury.  - left chest wound - Chronic full thickness wound with packing; mod amt green-tinged drainage, some odor.   .3X.3X3cm Surrounded by red macerated skin in the skin fold where it is frequently moist and rubs together; linear partial thickness fissure; .2X3X.1cm, red and moist, small amt tan drainage, no odor  Wound Marcus nurse recommends - narrow strip of Aquacel and packed with a swab to absorb drainage and provide antimicrobial benefits.  Pt can resume previous plan of Marcus with home health nursing after discharge.  * Hypertension   Hold lisinopril due ot renal failure  * Hyperkalemia   Stop oral K replacement. Give Lokelma as potassium more than 6 today.   All the records are reviewed and case discussed with Marcus Williams/Marcus Williams. Williams plans discussed with the patient, Nursing and they are in agreement.  CODE STATUS: Full Code  TOTAL TIME TAKING Marcus OF THIS PATIENT: 35 minutes.  His wife is admitted in Marcus Williams and need updates.  More than 50% of the time was spent in counseling/coordination of Marcus: YES  POSSIBLE D/C IN 1-2 DAYS, DEPENDING ON CLINICAL CONDITION.   Marcus Williams M.D on 05/23/2018 at 10:15 PM  Between 7am to 6pm - Pager - [phone]  After 6pm go to [email or web address] - Proofreader  [employer] [facility]  Office  [phone]  CC: Primary Marcus physician; Marcus Crouch, MD  Note: This dictation was prepared with Dragon dictation along with smaller phrase technology. Any transcriptional errors that result from this process are unintentional.

## 2018-05-23 NOTE — Progress Notes (Signed)
Upon arrival to 1900 shift, patient had CVC line newly placed by cardiology and being transcuteanously paced at bedside.  Cardiology recommendations were to leave pacer going to maintain heart rate.    Patient, having poor safety awareness, pulled out the newly placed right femoral CVC.  Vascular physician re-inserted a new line into the right femoral.  Patient was placed on BiPAP for decreased O2 saturation and increased WOB.  Sitter was ordered for patient safety.  Patient's pain was also treated while being paced.   Pacer was turned off at approx. 2174 on 05/23/18 with ICU physician.  Patient has maintained HR and BP without complications.  Dopamine drip was  stopped at approx. 7159.  Patient's BiPAP was changed to [place] at 6L at [place].  Patient resting comfortably and handoff report was given to day nurse.

## 2018-05-23 NOTE — Progress Notes (Signed)
[facility]  ROUNDING NOTE   Subjective:  Patient moved over to critical care unit yesterday. He was noted to have bradycardia and hypotension. Still quite somnolent at the moment. Output was 890 cc over the preceding 24 hours. Renal function about the same with a creatinine of 2.04 with an EGFR of 33 at the moment.   Objective:  Vital signs in last 24 hours:  Temp:  [97.7 F (36.5 C)-99.9 F (37.7 C)] 98.5 F (36.9 C) (12/08 0753) Pulse Rate:  [41-85] 72 (12/08 1000) Resp:  [15-30] 19 (12/08 1000) BP: (91-131)/(53-86) 125/70 (12/08 1000) SpO2:  [90 %-100 %] 100 % (12/08 1000) FiO2 (%):  [100 %] 100 % (12/08 0630) Weight:  [121.5 kg] 121.5 kg (12/08 0500)  Weight change: 11.5 kg Filed Weights   05/21/18 0513 05/22/18 0340 05/23/18 0500  Weight: 120.6 kg 110 kg 121.5 kg    Intake/Output: I/O last 3 completed shifts: In: 341.4 [P.O.:120; I.V.:178.7; IV Piggyback:42.7] Out: 895 [Urine:895]   Intake/Output this shift:  Total I/O In: 600 [P.O.:600] Out: 110 [Urine:110]  Physical Exam: General: Lethargic  Head: Normocephalic, atraumatic. Moist oral mucosal membranes  Eyes: Anicteric  Neck: Supple, trachea midline  Lungs:  Scattered rhonchi and rales, normal effort  Heart: S1S2 no rubs  Abdomen:  Soft, nontender, bowel sounds present  Extremities: 2+ peripheral edema.  Neurologic: Awake, alert, following commands  Skin: Excoriations on b/l LE's       Basic Metabolic Panel: Recent Labs  Lab 05/21/18 0419 05/22/18 0402 05/22/18 1852 05/22/18 2142 05/23/18 0503  NA 138 138 138 137 140  K 5.2* 6.1* 5.6* 5.5* 5.3*  CL 105 103 103 99 103  CO2 '25 23 24 26 27  '$ GLUCOSE 129* 173* 245* 186* 140*  BUN 33* 51* 55* 61* 64*  CREATININE 1.43* 1.92* 2.01* 2.11* 2.09*  CALCIUM 8.6* 8.7* 7.4* 8.5* 8.2*  MG  --   --  1.8  --   --   PHOS  --   --   --  6.0*  6.1*  --     Liver Function Tests: Recent Labs  Lab 05/19/18 0625 05/22/18 1852 05/22/18 2142   AST 42* 39  --   ALT 141* 68*  --   ALKPHOS 105 76  --   BILITOT 1.4* 1.1  --   PROT 7.3 5.7*  --   ALBUMIN 3.2* 2.8* 3.2*   No results for input(s): LIPASE, AMYLASE in the last 168 hours. No results for input(s): AMMONIA in the last 168 hours.  CBC: Recent Labs  Lab 05/19/18 0625 05/20/18 0420 05/21/18 0419 05/22/18 1852 05/23/18 0503  WBC 5.5 9.2 14.2* 9.0 11.1*  NEUTROABS 3.6  --   --  7.2  --   HGB 9.3* 8.8* 10.0* 8.9* 9.2*  HCT 32.3* 30.9* 35.0* 30.3* 31.0*  MCV 80.3 80.5 81.8 78.9* 77.9*  PLT 175 208 291 288 269    Cardiac Enzymes: Recent Labs  Lab 05/19/18 0625 05/22/18 1852 05/22/18 2221 05/23/18 0031 05/23/18 0828  TROPONINI 0.03* 0.04* 0.05* 0.04* 0.05*    BNP: Invalid input(s): POCBNP  CBG: Recent Labs  Lab 05/22/18 1928 05/22/18 2240 05/22/18 2346 05/23/18 0408 05/23/18 0740  GLUCAP 230* 167* 156* 135* 114*    Microbiology: Results for orders placed or performed during the hospital encounter of 05/19/18  MRSA PCR Screening     Status: None   Collection Time: 05/22/18  8:19 PM  Result Value Ref Range Status   MRSA by PCR  NEGATIVE NEGATIVE Final    Comment:        The GeneXpert MRSA Assay (FDA approved for NASAL specimens only), is one component of a comprehensive MRSA colonization surveillance program. It is not intended to diagnose MRSA infection nor to guide or monitor treatment for MRSA infections. Performed at [facility], [address] [place],  [address]   CULTURE, BLOOD (ROUTINE X 2) w Reflex to ID Panel     Status: None (Preliminary result)   Collection Time: 05/23/18  6:09 AM  Result Value Ref Range Status   Specimen Description BLOOD LEFT HAND  Final   Special Requests   Final    BOTTLES DRAWN AEROBIC ONLY Blood Culture adequate volume   Culture   Final    NO GROWTH <12 HOURS Performed at [facility], [address], [place],  [address]    Report Status PENDING  Incomplete     Coagulation Studies: Recent Labs    05/22/18 1852 05/23/18 0503  LABPROT 31.0* 26.9*  INR 3.04 2.53    Urinalysis: No results for input(s): COLORURINE, LABSPEC, PHURINE, GLUCOSEU, HGBUR, BILIRUBINUR, KETONESUR, PROTEINUR, UROBILINOGEN, NITRITE, LEUKOCYTESUR in the last 72 hours.  Invalid input(s): APPERANCEUR    Imaging: Dg Chest 2 View  Result Date: 05/22/2018 CLINICAL DATA:  Hypoxia. EXAM: CHEST - 2 VIEW COMPARISON:  Chest x-ray dated May 19, 2018. FINDINGS: Stable cardiomegaly status post CABG. Mild pulmonary vascular congestion is unchanged. Unchanged left ventricular/aortic conduit in the left lower lobe. Unchanged left lower lobe atelectasis. No pleural effusion or pneumothorax. No acute osseous abnormality. IMPRESSION: 1. Stable cardiomegaly and pulmonary vascular congestion. 2. Unchanged postsurgical changes and atelectasis in the left lower lobe. Electronically Signed   By: Titus Dubin M.D.   On: 05/22/2018 11:08   US Renal  Result Date: 05/22/2018 CLINICAL DATA:  Acute renal failure. EXAM: RENAL / URINARY TRACT ULTRASOUND COMPLETE COMPARISON:  10/07/2017 FINDINGS: Right Kidney: Renal measurements: 12.7 x 6.6 x 6.3 cm = volume: 279.7 mL . Normal renal cortical thickness and echogenicity without focal lesions or hydronephrosis. Left Kidney: Renal measurements: 11.3 x 6.0 x 6.4 cm = volume: 220.2 mL. Normal renal cortical thickness and echogenicity without focal lesions or hydronephrosis. Bladder: Appears normal for degree of bladder distention. Other: Small amount of free fluid noted around the liver and spleen. IMPRESSION: 1. Normal sonographic appearance of both kidneys. No hydronephrosis. 2. Small amount of free fluid noted around the liver and spleen. Electronically Signed   By: Marijo Sanes M.D.   On: 05/22/2018 12:39     Medications:   . DOPamine Stopped (05/23/18 0455)   . atorvastatin  40 mg Oral q1800  . atropine  0.5 mg Intravenous Once  . budesonide  (PULMICORT) nebulizer solution  0.5 mg Nebulization BID  . clopidogrel  75 mg Oral Q breakfast  . DULoxetine  20 mg Oral Daily  . heparin injection (subcutaneous)  5,000 Units Subcutaneous Q8H  . insulin aspart  0-15 Units Subcutaneous Q4H  . ipratropium-albuterol  3 mL Nebulization Q6H  . metoprolol tartrate  25 mg Oral BID  . OLANZapine zydis  10 mg Oral Once  . pantoprazole  40 mg Oral BID  . sodium chloride flush  3 mL Intravenous Q12H  . sodium polystyrene  15 g Oral Once  . sodium zirconium cyclosilicate  10 g Oral BID   albuterol, chlorpheniramine-HYDROcodone, fentaNYL (SUBLIMAZE) injection, nitroGLYCERIN, ondansetron (ZOFRAN) IV, pramipexole, sodium chloride, zolpidem  Assessment/ Plan:  62 y.o. male with  a PMHx of atrial fibrillation, aortic stenosis, coronary disease, chronic systolic heart failure ejection fraction 45 to 50%, COPD, diabetes mellitus type 2, GERD, hypertension, obstructive sleep apnea, history of CVA, history of SVT, who was admitted to [facility] on 05/19/2018 for evaluation of increasing shortness of breath.  1.  Acute renal failure, normal baseline Cr 0.6 2.  Acute on chronic systolic heart failure exacerbation, EF 45-50% 3.  Hyperkalemia. 4.  Anemia unspecified.  Plan: Patient continues to have ongoing acute renal failure likely secondary to acute cardiorenal syndrome.  He was previously on lisinopril and Lasix which have been stopped.  As before we are hesitant to administer the patient IV fluids given low ejection fraction and ongoing volume overload.  He is maintained on dopamine at the moment.  Per kalemia has improved as potassium down to 5.3.  Continue sodium zirconium 10 mg twice daily for now.  No urgent indication for dialysis at the moment however this may be a possibility if urine output drops further.    LOS: 4 Marcus Williams 12/8/201911:55 AM

## 2018-05-24 LAB — GLUCOSE, CAPILLARY
Glucose-Capillary: 102 mg/dL — ABNORMAL HIGH (ref 70–99)
Glucose-Capillary: 107 mg/dL — ABNORMAL HIGH (ref 70–99)
Glucose-Capillary: 121 mg/dL — ABNORMAL HIGH (ref 70–99)
Glucose-Capillary: 127 mg/dL — ABNORMAL HIGH (ref 70–99)
Glucose-Capillary: 127 mg/dL — ABNORMAL HIGH (ref 70–99)
Glucose-Capillary: 133 mg/dL — ABNORMAL HIGH (ref 70–99)

## 2018-05-24 LAB — BASIC METABOLIC PANEL
Anion gap: 6 (ref 5–15)
BUN: 58 mg/dL — ABNORMAL HIGH (ref 8–23)
CO2: 28 mmol/L (ref 22–32)
Calcium: 8 mg/dL — ABNORMAL LOW (ref 8.9–10.3)
Chloride: 98 mmol/L (ref 98–111)
Creatinine, Ser: 1.7 mg/dL — ABNORMAL HIGH (ref 0.61–1.24)
GFR calc Af Amer: 49 mL/min — ABNORMAL LOW (ref >60)
GFR calc non Af Amer: 42 mL/min — ABNORMAL LOW (ref >60)
Glucose, Bld: 114 mg/dL — ABNORMAL HIGH (ref 70–99)
Potassium: 4.4 mmol/L (ref 3.5–5.1)
Sodium: 132 mmol/L — ABNORMAL LOW (ref 135–145)

## 2018-05-24 LAB — CBC
HCT: 28.1 % — ABNORMAL LOW (ref 39.0–52.0)
HEMOGLOBIN: 8.4 g/dL — AB (ref 13.0–17.0)
MCH: 23.2 pg — ABNORMAL LOW (ref 26.0–34.0)
MCHC: 29.9 g/dL — ABNORMAL LOW (ref 30.0–36.0)
MCV: 77.6 fL — ABNORMAL LOW (ref 80.0–100.0)
Platelets: 215 10*3/uL (ref 150–400)
RBC: 3.62 MIL/uL — ABNORMAL LOW (ref 4.22–5.81)
RDW: 18.5 % — ABNORMAL HIGH (ref 11.5–15.5)
WBC: 8.9 10*3/uL (ref 4.0–10.5)
nRBC: 0 % (ref 0.0–0.2)

## 2018-05-24 MED ORDER — SODIUM CHLORIDE 0.9% FLUSH: 10.0000 mL | INTRAVENOUS | Status: DC | PRN

## 2018-05-24 MED ORDER — FUROSEMIDE 10 MG/ML IJ SOLN
60.0000 mg | Freq: Once | INTRAMUSCULAR | Status: AC
  Administered 2018-05-24: 60 mg via INTRAVENOUS
  Filled 2018-05-24: qty 6

## 2018-05-24 MED ORDER — SODIUM CHLORIDE 0.9% FLUSH
10.0000 mL | Freq: Two times a day (BID) | INTRAVENOUS | Status: DC
  Administered 2018-05-25: 10 mL

## 2018-05-24 NOTE — Progress Notes (Signed)
[facility] Cardiology  SUBJECTIVE: Patient laying in bed, denies chest pain or shortness of breath   Vitals:   05/24/18 0459 05/24/18 0500 05/24/18 0531 05/24/18 0600  BP:  119/65  123/74  Pulse:  81  73  Resp:  (!) 24  (!) 21  Temp:      TempSrc:      SpO2: 96% 94% (!) 89% 97%  Weight:  109.4 kg    Height:         Intake/Output Summary (Last 24 hours) at 05/24/2018 2202 Last data filed at 05/24/2018 0000 Gross per 24 hour  Intake 1680 ml  Output 790 ml  Net 890 ml      PHYSICAL EXAM  General: Well developed, well nourished, in no acute distress HEENT:  Normocephalic and atramatic Neck:  No JVD.  Lungs: Clear bilaterally to auscultation and percussion. Heart: HRRR . Normal S1 and S2 without gallops or murmurs.  Abdomen: Bowel sounds are positive, abdomen soft and non-tender  Msk:  Back normal, normal gait. Normal strength and tone for age. Extremities: No clubbing, cyanosis or edema.   Neuro: Alert and oriented X 3. Psych:  Good affect, responds appropriately   LABS: Basic Metabolic Panel: Recent Labs    05/22/18 1852 05/22/18 2142  05/23/18 2138 05/24/18 0536  NA 138 137   < > 133* 132*  K 5.6* 5.5*   < > 4.5 4.4  CL 103 99   < > 98 98  CO2 24 26   < > 26 28  GLUCOSE 245* 186*   < > 167* 114*  BUN 55* 61*   < > 58* 58*  CREATININE 2.01* 2.11*   < > 1.71* 1.70*  CALCIUM 7.4* 8.5*   < > 8.2* 8.0*  MG 1.8  --   --  1.9  --   PHOS  --  6.0*  6.1*  --  4.0  --    < > = values in this interval not displayed.   Liver Function Tests: Recent Labs    05/22/18 1852 05/22/18 2142  AST 39  --   ALT 68*  --   ALKPHOS 76  --   BILITOT 1.1  --   PROT 5.7*  --   ALBUMIN 2.8* 3.2*   No results for input(s): LIPASE, AMYLASE in the last 72 hours. CBC: Recent Labs    05/22/18 1852 05/23/18 0503 05/24/18 0536  WBC 9.0 11.1* 8.9  NEUTROABS 7.2  --   --   HGB 8.9* 9.2* 8.4*  HCT 30.3* 31.0* 28.1*  MCV 78.9* 77.9* 77.6*  PLT 288 269 215   Cardiac  Enzymes: Recent Labs    05/23/18 0031 05/23/18 0828 05/23/18 2138  TROPONINI 0.04* 0.05* 0.05*   BNP: Invalid input(s): POCBNP D-Dimer: No results for input(s): DDIMER in the last 72 hours. Hemoglobin A1C: No results for input(s): HGBA1C in the last 72 hours. Fasting Lipid Panel: No results for input(s): CHOL, HDL, LDLCALC, TRIG, CHOLHDL, LDLDIRECT in the last 72 hours. Thyroid Function Tests: No results for input(s): TSH, T4TOTAL, T3FREE, THYROIDAB in the last 72 hours.  Invalid input(s): FREET3 Anemia Panel: No results for input(s): VITAMINB12, FOLATE, FERRITIN, TIBC, IRON, RETICCTPCT in the last 72 hours.  Dg Chest 2 View  Result Date: 05/22/2018 CLINICAL DATA:  Hypoxia. EXAM: CHEST - 2 VIEW COMPARISON:  Chest x-ray dated May 19, 2018. FINDINGS: Stable cardiomegaly status post CABG. Mild pulmonary vascular congestion is unchanged. Unchanged left ventricular/aortic conduit in the left lower lobe.  Unchanged left lower lobe atelectasis. No pleural effusion or pneumothorax. No acute osseous abnormality. IMPRESSION: 1. Stable cardiomegaly and pulmonary vascular congestion. 2. Unchanged postsurgical changes and atelectasis in the left lower lobe. Electronically Signed   By: Titus Dubin M.D.   On: 05/22/2018 11:08   US Renal  Result Date: 05/22/2018 CLINICAL DATA:  Acute renal failure. EXAM: RENAL / URINARY TRACT ULTRASOUND COMPLETE COMPARISON:  10/07/2017 FINDINGS: Right Kidney: Renal measurements: 12.7 x 6.6 x 6.3 cm = volume: 279.7 mL . Normal renal cortical thickness and echogenicity without focal lesions or hydronephrosis. Left Kidney: Renal measurements: 11.3 x 6.0 x 6.4 cm = volume: 220.2 mL. Normal renal cortical thickness and echogenicity without focal lesions or hydronephrosis. Bladder: Appears normal for degree of bladder distention. Other: Small amount of free fluid noted around the liver and spleen. IMPRESSION: 1. Normal sonographic appearance of both kidneys. No  hydronephrosis. 2. Small amount of free fluid noted around the liver and spleen. Electronically Signed   By: Marijo Sanes M.D.   On: 05/22/2018 12:39     Echo LVEF 45 to 50%  TELEMETRY: Sinus rhythm 70 bpm:  ASSESSMENT AND PLAN:  Active Problems:   Acute respiratory failure with hypoxemia (HCC)   Pressure injury of skin    1.  Transient bradycardia, with junctional bradycardia in 50s, in the setting of acute renal failure, hyperkalemia, required temporary transcutaneous pacing and dopamine, now resolved 2.  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  Status post DES SVG to RCA 10/23/2017 4.  Paroxysmal atrial fibrillation, on Eliquis  Recommendations  1.  Continue current therapy 2.  Continue metoprolol tartrate at lower dose 3.  Transfer to telemetry 4.  Continue Plavix to prevent in-stent thrombosis 5.  Continue Eliquis for stroke prevention 6.  No indication at this time for permanent pacemaker  Sign off for now, please call with any questions   Isaias Cowman, MD, PhD, [facility] 05/24/2018 8:22 AM

## 2018-05-24 NOTE — Progress Notes (Addendum)
[facility] [place] NAME: Marcus Williams    MR#:  [id number]  DATE OF BIRTH:  05/29/1956  SUBJECTIVE:  CHIEF COMPLAINT:   Chief Complaint  Patient presents with  . Shortness of Breath   Had bradycardia and hypotension, sent to ICU.  Patient had transcutaneous pacing which was temporary After correction of potassium, at 5.3 today.  Patient is feeling weakness  REVIEW OF SYSTEMS:  Review of Systems  Constitutional: Negative for chills, fever and weight loss.  HENT: Negative for nosebleeds and sore throat.   Eyes: Negative for blurred vision.  Respiratory: Positive for shortness of breath. Negative for cough and wheezing.   Cardiovascular: Positive for leg swelling. Negative for chest pain, orthopnea and PND.  Gastrointestinal: Negative for abdominal pain, constipation, diarrhea, heartburn, nausea and vomiting.  Genitourinary: Negative for dysuria and urgency.  Musculoskeletal: Negative for back pain.  Skin: Negative for rash.  Neurological: Negative for dizziness, speech change, focal weakness and headaches.  Endo/Heme/Allergies: Does not bruise/bleed easily.  Psychiatric/Behavioral: Negative for depression.    DRUG ALLERGIES:   Allergies  Allergen Reactions  . Contrast Media [Iodinated Diagnostic Agents] Shortness Of Breath    Hypoxia/hypotension  . Versed [Midazolam] Shortness Of Breath and Other (See Comments)    Hypotension   VITALS:  Blood pressure (!) 141/80, pulse 72, temperature 97.7 F (36.5 C), temperature source Oral, resp. rate 20, height 5\' 7"  (1.702 m), weight 109.4 kg, SpO2 100 %. PHYSICAL EXAMINATION:  Physical Exam  Constitutional: He is oriented to person, place, and time.  HENT:  Head: Normocephalic and atraumatic.  Eyes: Pupils are equal, round, and reactive to light. Conjunctivae and EOM are normal.  Neck: Normal range of motion. Neck supple. No tracheal deviation present. No thyromegaly present.    Cardiovascular: Normal rate, regular rhythm and normal heart sounds.  Pulmonary/Chest: Effort normal. No respiratory distress. He has decreased breath sounds. He has no wheezes. He exhibits no tenderness.  Abdominal: Soft. Bowel sounds are normal. He exhibits no distension. There is no tenderness.  Musculoskeletal: Normal range of motion.       Right lower leg: He exhibits edema.       Left lower leg: He exhibits edema.  Neurological: He is alert and oriented to person, place, and time. No cranial nerve deficit.  Skin: Skin is warm and dry. No rash noted.  Has pressure ulcer on sacrum with dressing in place Has skin breakdown on both legs with some transparent dressing present and some small area of yellowish collection underneath the dressing both side.   LABORATORY PANEL:  Male CBC Recent Labs  Lab 05/24/18 0536  WBC 8.9  HGB 8.4*  HCT 28.1*  PLT 215   ------------------------------------------------------------------------------------------------------------------ Chemistries  Recent Labs  Lab 05/22/18 1852  05/23/18 2138 05/24/18 0536  NA 138   < > 133* 132*  K 5.6*   < > 4.5 4.4  CL 103   < > 98 98  CO2 24   < > 26 28  GLUCOSE 245*   < > 167* 114*  BUN 55*   < > 58* 58*  CREATININE 2.01*   < > 1.71* 1.70*  CALCIUM 7.4*   < > 8.2* 8.0*  MG 1.8  --  1.9  --   AST 39  --   --   --   ALT 68*  --   --   --   ALKPHOS 76  --   --   --  BILITOT 1.1  --   --   --    < > = values in this interval not displayed.   RADIOLOGY:  No results found. ASSESSMENT AND PLAN:  58 y m with acute hypoxic resp failure  * Acute Hypoxic Resp failure - due to COPD/CHF exacerbation Clinically improving  * hyperkalemia   Lokelma, nephrologist is following Potassium at 5.3  * Bradycardia, Hypotension  required temporary pacer and dopamine, Improved. No indication for permanent pacemaker per cardiology and signed off  * Acute on chronic systolic dysfunction congestive heart  failure with pulmonary edema  - continue aggressive Diuresis - Appreciate Cardio input, echo - EF 45-50% - renal func worse, stopped lasix. -Recommending to continue Plavix to prevent in-stent thrombosis and Eliquis for stroke prevention Recommending to continue metoprolol tartrate at low-dose  * Ac renal failure-cardiorenal syndrome   Stop lasix, lisinopril, ultrasound renal without obstruction. Nephrology is following Creatinine 2.09-1.7. Avoid nephrotoxins and check a.m. labs   *Diabetes mellitus - insulin Lantus, continue sliding scale -Diabetic nurse consult -Stop metformin due to renal failure.  * Elevated troponin - likely due to demand ischemia, monitor on tele  * Sinus tach: continue Metoprolol  *Pressure ulcer - on sacrum with stage 2 pressure injury Per wound care nurse which I agree with   Measurement: .2X.2X.1cm Wound bed: dark red Drainage (amount, consistency, odor) no odor or drainage Periwound: intact skin surrounding Wound care nurse recommends Foam dressing to protect from further injury.  - left chest wound - Chronic full thickness wound with packing; mod amt green-tinged drainage, some odor.   .3X.3X3cm Surrounded by red macerated skin in the skin fold ,wet  linear partial thickness fissure; .2X3X.1cm, red and moist, small amt tan drainage, no odor Wound care nurse recommends - narrow strip of Aquacel and packed with a swab to absorb drainage and provide antimicrobial benefits.  Pt can resume previous plan of care with home health nursing after discharge.  * Hypertension   Hold lisinopril due ot renal failure  * Hyperkalemia     Discontinued potassium supplements and Lokelma as potassium is at 4.4 .   Generalized weakness offered physical therapy assistance patient said he will be all right and not considering physical therapy at this time  All the records are reviewed and case discussed with Care Management/Social Worker. Management plans  discussed with the patient, Nursing and they are in agreement.  CODE STATUS: Full Code  TOTAL TIME TAKING CARE OF THIS PATIENT: 35 minutes.  His wife is admitted in [facility] and need updates.  More than 50% of the time was spent in counseling/coordination of care: YES  POSSIBLE D/C IN 1-2 DAYS, DEPENDING ON CLINICAL CONDITION.   [name] M.D on 05/24/2018 at 3:43 PM  Between 7am to 6pm - Pager - [phone]  After 6pm go to [email or web address] - Proofreader  [facility]  Office  [phone]  CC: Primary care physician; Idelle Crouch, MD  Note: This dictation was prepared with Dragon dictation along with smaller phrase technology. Any transcriptional errors that result from this process are unintentional.

## 2018-05-24 NOTE — Progress Notes (Signed)
Removed old wound dressing at this time, repacked tunneled wound (as ordered), and replaced wound foam dressing with a new one. Dated and timed dressing. Patient tolerated well. Will continue to monitor wound dressing status. Wenda Low [facility]

## 2018-05-24 NOTE — Progress Notes (Signed)
   05/24/18 1100  Clinical Encounter Type  Visited With Patient  Visit Type Follow-up;Spiritual support  Recommendations Follow-up as requested.  Spiritual Encounters  Spiritual Needs Emotional;Prayer   Chaplain followed up with the patient, who wished to sleep. Chaplain offered silent prayers of petition.

## 2018-05-24 NOTE — Progress Notes (Signed)
[facility]  ROUNDING NOTE   Subjective:   UOP 900  hgb 8.4  Creatinine 1.7 (1.71)  K 5.3 - on lokelma   Confused   Objective:  Vital signs in last 24 hours:  Temp:  [97.4 F (36.3 C)-98.9 F (37.2 C)] 98 F (36.7 C) (12/09 0800) Pulse Rate:  [56-81] 70 (12/09 0900) Resp:  [17-25] 21 (12/09 0900) BP: (99-144)/(51-110) 128/70 (12/09 0900) SpO2:  [89 %-100 %] 100 % (12/09 0900) Weight:  [109.4 kg] 109.4 kg (12/09 0500)  Weight change: -12.1 kg Filed Weights   05/22/18 0340 05/23/18 0500 05/24/18 0500  Weight: 110 kg 121.5 kg 109.4 kg    Intake/Output: I/O last 3 completed shifts: In: 2138.4 [P.O.:1920; I.V.:175.7; IV Piggyback:42.7] Out: 1795 [MVHQI:6962]   Intake/Output this shift:  Total I/O In: 240 [P.O.:240] Out: 300 [Urine:300]  Physical Exam: General: NAD, laying in bed  Head: Normocephalic, atraumatic. Moist oral mucosal membranes  Eyes: Anicteric, PERRL  Neck: Supple, trachea midline  Lungs:  Bilateral crackles  Heart: Regular rate and rhythm  Abdomen:  Soft, nontender, obese  Extremities: ++ peripheral edema.  Neurologic: Alert to self and place  Skin: No lesions        Basic Metabolic Panel: Recent Labs  Lab 05/22/18 1852 05/22/18 2142 05/23/18 0503 05/23/18 2138 05/24/18 0536  NA 138 137 140 133* 132*  K 5.6* 5.5* 5.3* 4.5 4.4  CL 103 99 103 98 98  CO2 24 26 27 26 28   GLUCOSE 245* 186* 140* 167* 114*  BUN 55* 61* 64* 58* 58*  CREATININE 2.01* 2.11* 2.09* 1.71* 1.70*  CALCIUM 7.4* 8.5* 8.2* 8.2* 8.0*  MG 1.8  --   --  1.9  --   PHOS  --  6.0*  6.1*  --  4.0  --     Liver Function Tests: Recent Labs  Lab 05/19/18 0625 05/22/18 1852 05/22/18 2142  AST 42* 39  --   ALT 141* 68*  --   ALKPHOS 105 76  --   BILITOT 1.4* 1.1  --   PROT 7.3 5.7*  --   ALBUMIN 3.2* 2.8* 3.2*   No results for input(s): LIPASE, AMYLASE in the last 168 hours. No results for input(s): AMMONIA in the last 168 hours.  CBC: Recent  Labs  Lab 05/19/18 0625 05/20/18 0420 05/21/18 0419 05/22/18 1852 05/23/18 0503 05/24/18 0536  WBC 5.5 9.2 14.2* 9.0 11.1* 8.9  NEUTROABS 3.6  --   --  7.2  --   --   HGB 9.3* 8.8* 10.0* 8.9* 9.2* 8.4*  HCT 32.3* 30.9* 35.0* 30.3* 31.0* 28.1*  MCV 80.3 80.5 81.8 78.9* 77.9* 77.6*  PLT 175 208 291 288 269 215    Cardiac Enzymes: Recent Labs  Lab 05/22/18 1852 05/22/18 2221 05/23/18 0031 05/23/18 0828 05/23/18 2138  TROPONINI 0.04* 0.05* 0.04* 0.05* 0.05*    BNP: Invalid input(s): POCBNP  CBG: Recent Labs  Lab 05/23/18 1155 05/23/18 1542 05/23/18 1933 05/24/18 0016 05/24/18 0346  GLUCAP 216* 205* 160* 127* 107*    Microbiology: Results for orders placed or performed during the hospital encounter of 05/19/18  MRSA PCR Screening     Status: None   Collection Time: 05/22/18  8:19 PM  Result Value Ref Range Status   MRSA by PCR NEGATIVE NEGATIVE Final    Comment:        The GeneXpert MRSA Assay (FDA approved for NASAL specimens only), is one component of a comprehensive MRSA colonization surveillance program.  It is not intended to diagnose MRSA infection nor to guide or monitor treatment for MRSA infections. Performed at [facility], [address] [place], [place] [address]   CULTURE, BLOOD (ROUTINE X 2) w Reflex to ID Panel     Status: None (Preliminary result)   Collection Time: 05/23/18  6:09 AM  Result Value Ref Range Status   Specimen Description BLOOD LEFT HAND  Final   Special Requests   Final    BOTTLES DRAWN AEROBIC ONLY Blood Culture adequate volume   Culture   Final    NO GROWTH 1 DAY Performed at [facility], [address], [place], [place] [address]    Report Status PENDING  Incomplete  CULTURE, BLOOD (ROUTINE X 2) w Reflex to ID Panel     Status: None (Preliminary result)   Collection Time: 05/23/18  8:27 AM  Result Value Ref Range Status   Specimen Description BLOOD LAC  Final   Special Requests   Final     BOTTLES DRAWN AEROBIC AND ANAEROBIC Blood Culture adequate volume   Culture   Final    NO GROWTH < 24 HOURS Performed at [facility], [address], [place], [place] [address]    Report Status PENDING  Incomplete    Coagulation Studies: Recent Labs    05/22/18 1852 05/23/18 0503 05/23/18 2138  LABPROT 31.0* 26.9* 21.8*  INR 3.04 2.53 1.93    Urinalysis: No results for input(s): COLORURINE, LABSPEC, PHURINE, GLUCOSEU, HGBUR, BILIRUBINUR, KETONESUR, PROTEINUR, UROBILINOGEN, NITRITE, LEUKOCYTESUR in the last 72 hours.  Invalid input(s): APPERANCEUR    Imaging: US Renal  Result Date: 05/22/2018 CLINICAL DATA:  Acute renal failure. EXAM: RENAL / URINARY TRACT ULTRASOUND COMPLETE COMPARISON:  10/07/2017 FINDINGS: Right Kidney: Renal measurements: 12.7 x 6.6 x 6.3 cm = volume: 279.7 mL . Normal renal cortical thickness and echogenicity without focal lesions or hydronephrosis. Left Kidney: Renal measurements: 11.3 x 6.0 x 6.4 cm = volume: 220.2 mL. Normal renal cortical thickness and echogenicity without focal lesions or hydronephrosis. Bladder: Appears normal for degree of bladder distention. Other: Small amount of free fluid noted around the liver and spleen. IMPRESSION: 1. Normal sonographic appearance of both kidneys. No hydronephrosis. 2. Small amount of free fluid noted around the liver and spleen. Electronically Signed   By: Marijo Sanes M.D.   On: 05/22/2018 12:39     Medications:    . atorvastatin  40 mg Oral q1800  . atropine  0.5 mg Intravenous Once  . budesonide (PULMICORT) nebulizer solution  0.5 mg Nebulization BID  . clopidogrel  75 mg Oral Q breakfast  . DULoxetine  20 mg Oral Daily  . heparin injection (subcutaneous)  5,000 Units Subcutaneous Q8H  . insulin aspart  0-15 Units Subcutaneous Q4H  . ipratropium-albuterol  3 mL Nebulization Q6H  . metoprolol tartrate  25 mg Oral BID  . OLANZapine zydis  10 mg Oral Once  . pantoprazole  40 mg Oral BID  .  sodium chloride flush  3 mL Intravenous Q12H  . sodium polystyrene  15 g Oral Once  . sodium zirconium cyclosilicate  10 g Oral BID   albuterol, chlorpheniramine-HYDROcodone, fentaNYL (SUBLIMAZE) injection, nitroGLYCERIN, ondansetron (ZOFRAN) IV, pramipexole, sodium chloride, zolpidem  Assessment/ Plan:  [place] is a 62 y.o. white male with atrial fibrillation, aortic stenosis, coronary disease, chronic systolic heart failure, COPD, diabetes mellitus type 2, GERD, hypertension, obstructive sleep apnea, history of CVA, history of SVT, who was admitted to  [facility] on12/09/2017   1. Acute renal failure with hyperkalemia, normal baseline Cr 0.6  Acute renal failure secondary to acute cardiorenal syndrome Holding lisinopril.  - Furosemide 60mg  x 1 - sodium zirconium  2. Acute on chronic systolic heart failure exacerbation : off dopamine gtt.  - start fluid restriction - furosemide as above.   3. Anemia with renal failure: hemoglobin 8.4 - microcytic   LOS: 5 [place] 12/9/20199:56 AM

## 2018-05-25 LAB — RENAL FUNCTION PANEL
Albumin: 3.1 g/dL — ABNORMAL LOW (ref 3.5–5.0)
Anion gap: 9 (ref 5–15)
BUN: 44 mg/dL — ABNORMAL HIGH (ref 8–23)
CHLORIDE: 99 mmol/L (ref 98–111)
CO2: 26 mmol/L (ref 22–32)
Calcium: 8.2 mg/dL — ABNORMAL LOW (ref 8.9–10.3)
Creatinine, Ser: 1.22 mg/dL (ref 0.61–1.24)
GFR calc Af Amer: >60 mL/min (ref >60)
GFR calc non Af Amer: >60 mL/min (ref >60)
Glucose, Bld: 142 mg/dL — ABNORMAL HIGH (ref 70–99)
Phosphorus: 3.3 mg/dL (ref 2.5–4.6)
Potassium: 3.7 mmol/L (ref 3.5–5.1)
Sodium: 134 mmol/L — ABNORMAL LOW (ref 135–145)

## 2018-05-25 LAB — GLUCOSE, CAPILLARY
GLUCOSE-CAPILLARY: 114 mg/dL — AB (ref 70–99)
Glucose-Capillary: 115 mg/dL — ABNORMAL HIGH (ref 70–99)
Glucose-Capillary: 131 mg/dL — ABNORMAL HIGH (ref 70–99)
Glucose-Capillary: 133 mg/dL — ABNORMAL HIGH (ref 70–99)
Glucose-Capillary: 136 mg/dL — ABNORMAL HIGH (ref 70–99)
Glucose-Capillary: 149 mg/dL — ABNORMAL HIGH (ref 70–99)
Glucose-Capillary: 151 mg/dL — ABNORMAL HIGH (ref 70–99)

## 2018-05-25 IMAGING — US US RENAL
1 series · 14 of 25 positions shown · non-contrast
Comparison: 03/24/2017 CT and 04/04/2016 ultrasound

CLINICAL DATA: 61-year-old male with acute renal failure

EXAM:
RENAL / URINARY TRACT ULTRASOUND COMPLETE

[Series 1: us renal · 0.31mm/px · 14 of 35 slices shown]
[im 1/35]
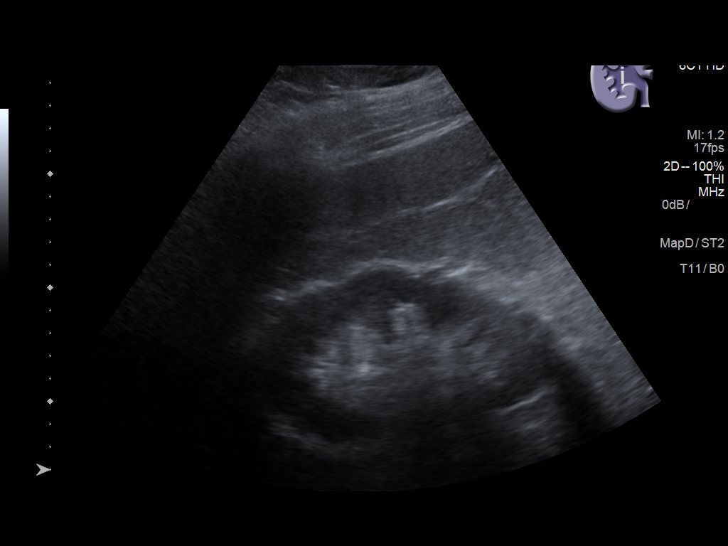
[im 3/35]
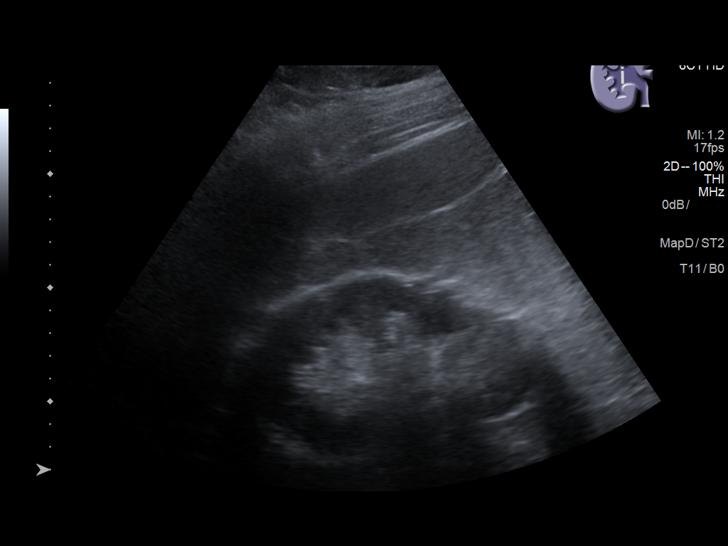
[im 6/35]
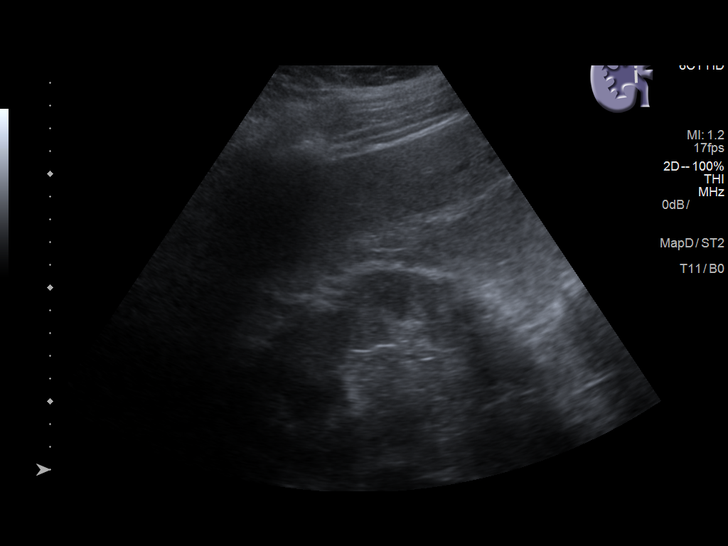
[im 9/35]
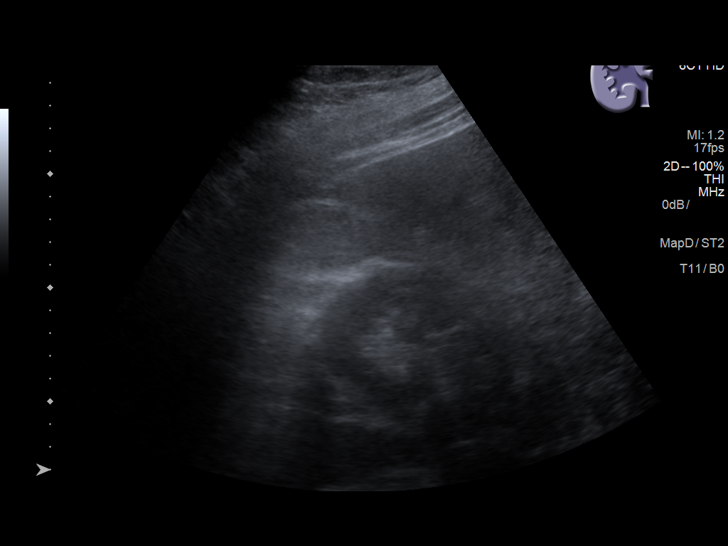
[im 12/35]
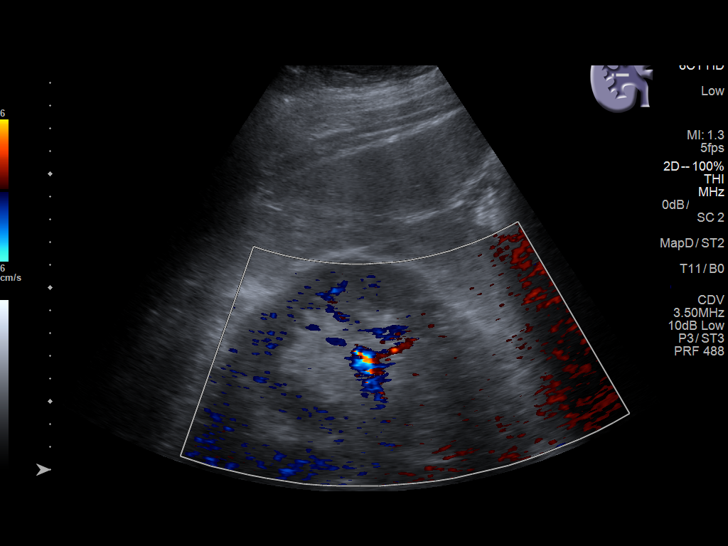
[im 13/35]
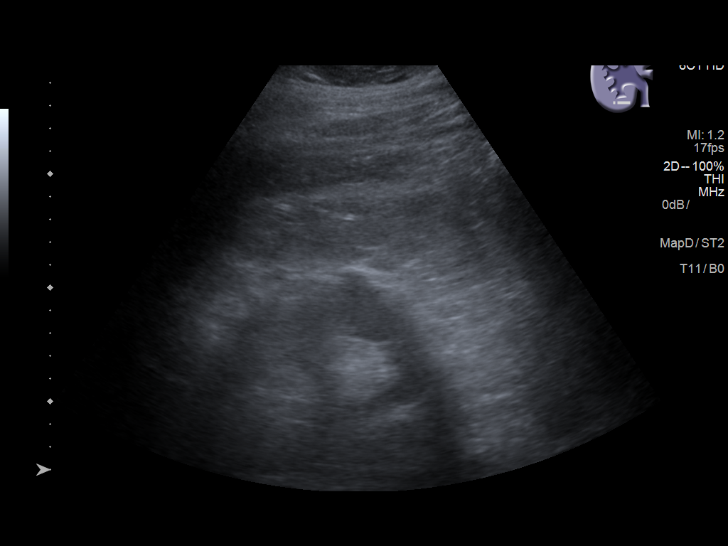
[im 16/35]
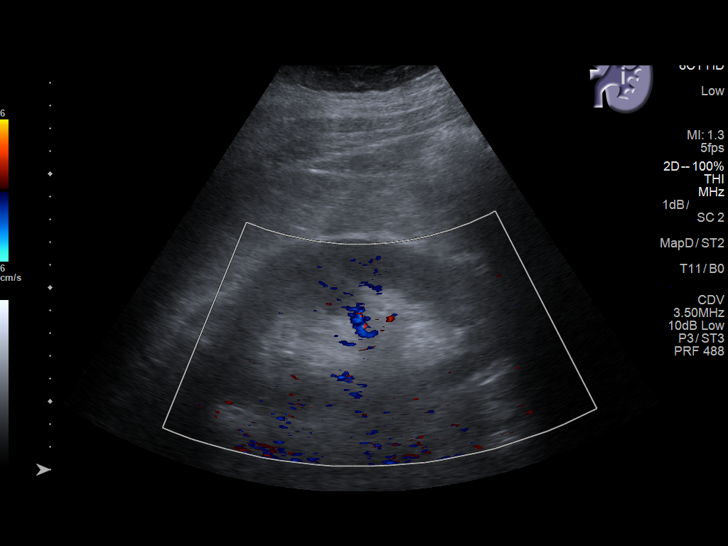
[im 19/35]
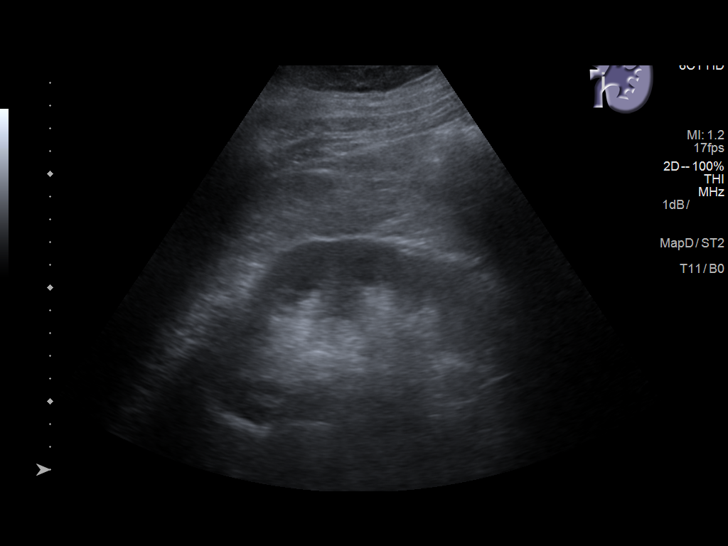
[im 22/35]
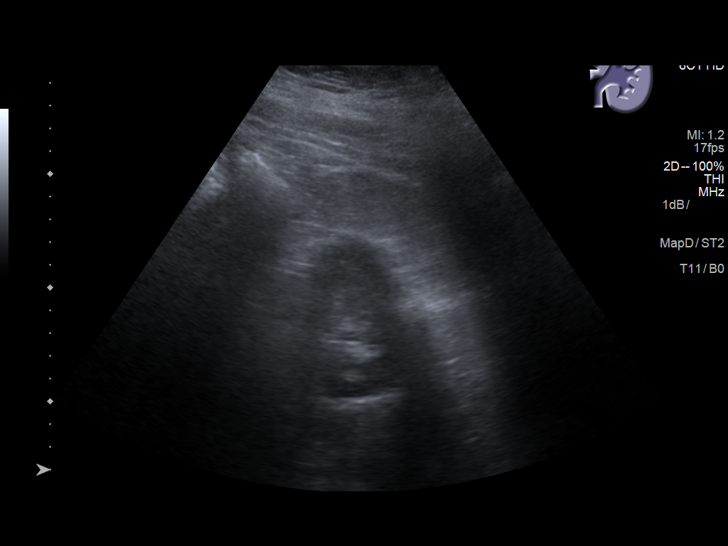
[im 23/35]
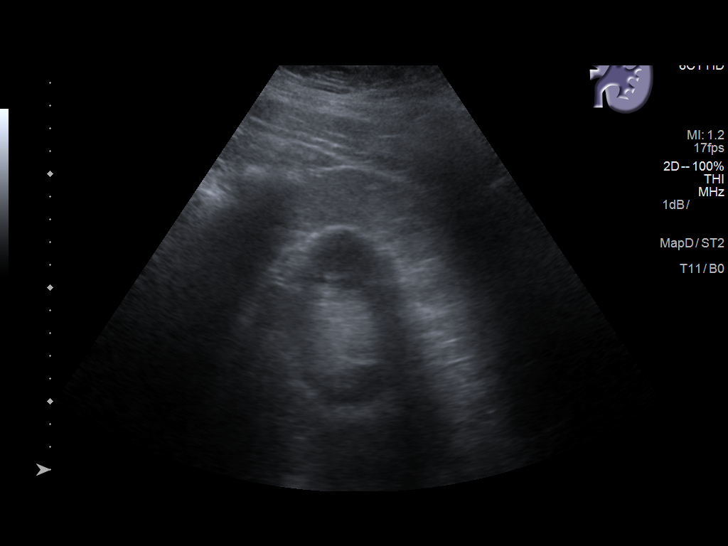
[im 26/35]
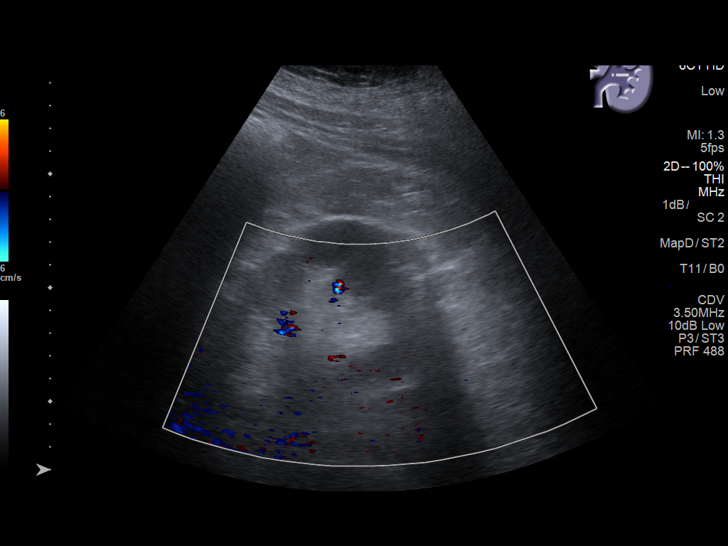
[im 29/35]
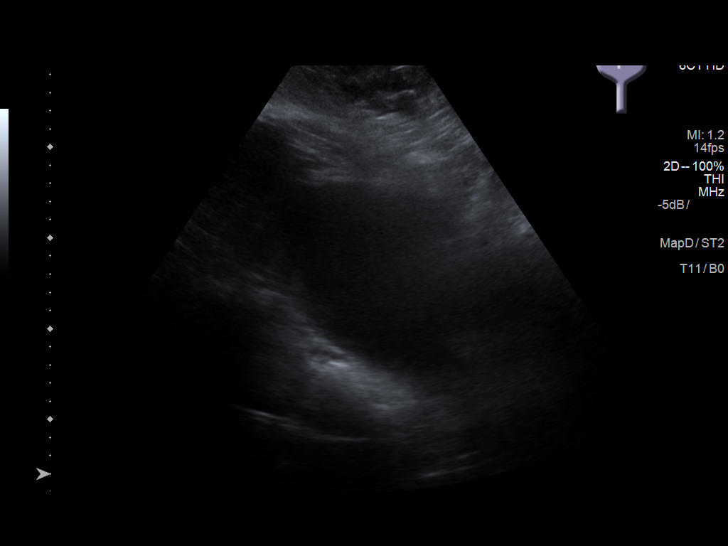
[im 32/35]
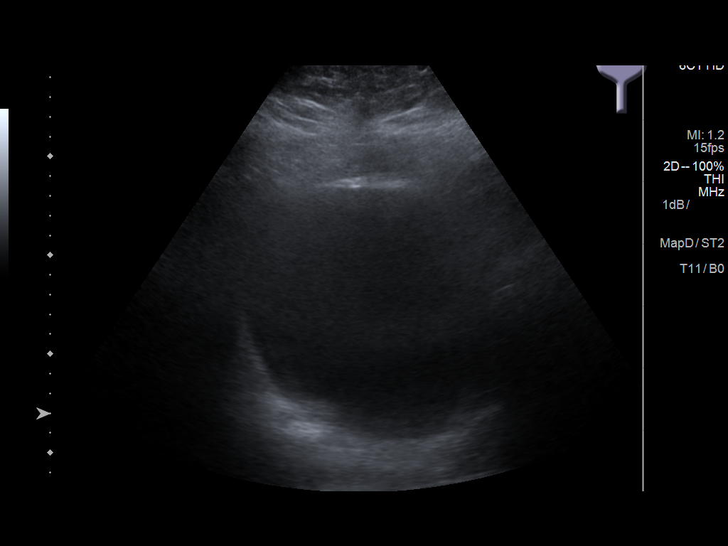
[im 35/35]
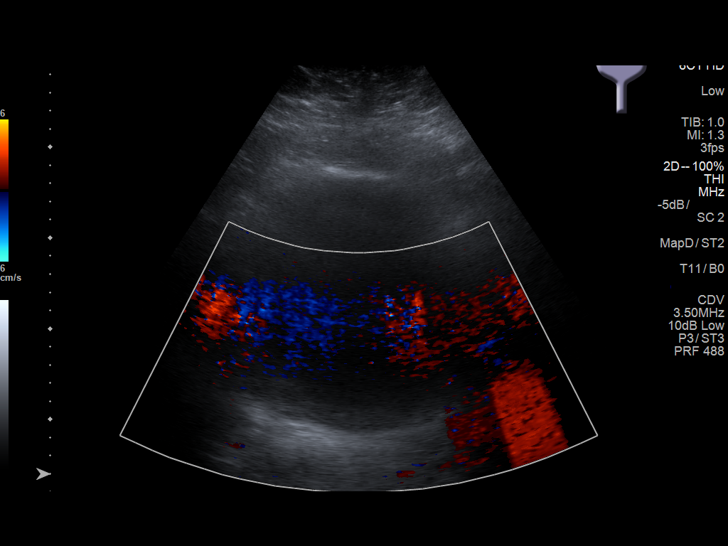

[14 of 25 positions shown; findings below may reference images not displayed]

FINDINGS: Right Kidney:

Length: 14.1 cm with mild cortical thinning. Echogenicity within
normal limits. No mass or hydronephrosis visualized.

Left Kidney:

Length: 14.3 cm with mild cortical thinning. Echogenicity within
normal limits. No mass or hydronephrosis visualized.

Bladder:

Appears normal for degree of bladder distention.
IMPRESSION: Mild bilateral renal cortical thinning without other significant
abnormality.

## 2018-05-25 MED ORDER — TORSEMIDE 20 MG PO TABS
20.0000 mg | ORAL_TABLET | Freq: Every day | ORAL | Status: DC
  Administered 2018-05-25 – 2018-06-01 (×8): 20 mg via ORAL
  Filled 2018-05-25 (×8): qty 1

## 2018-05-25 MED ORDER — IPRATROPIUM-ALBUTEROL 0.5-2.5 (3) MG/3ML IN SOLN
3.0000 mL | Freq: Three times a day (TID) | RESPIRATORY_TRACT | Status: DC
  Administered 2018-05-26 – 2018-05-27 (×4): 3 mL via RESPIRATORY_TRACT
  Filled 2018-05-25 (×4): qty 3

## 2018-05-25 NOTE — Plan of Care (Signed)

## 2018-05-25 NOTE — Progress Notes (Signed)
[place] [facility] [place] NAME: [place]    MR#:  [id number]  DATE OF BIRTH:  12-Feb-1956  SUBJECTIVE:  CHIEF COMPLAINT:   Chief Complaint  Patient presents with  . Shortness of Breath   Had bradycardia and hypotension, sent to ICU.  Patient had transcutaneous pacing which was temporary After correction of potassium, at 3.7 today.  Patient is feeling weak but refused physical therapy assessment and wants to go home with home health PT eventually changed his mind after talking to his wife  REVIEW OF SYSTEMS:  Review of Systems  Constitutional: Negative for chills, fever and weight loss.  HENT: Negative for nosebleeds and sore throat.   Eyes: Negative for blurred vision.  Respiratory: Positive for shortness of breath. Negative for cough and wheezing.   Cardiovascular: Positive for leg swelling. Negative for chest pain, orthopnea and PND.  Gastrointestinal: Negative for abdominal pain, constipation, diarrhea, heartburn, nausea and vomiting.  Genitourinary: Negative for dysuria and urgency.  Musculoskeletal: Negative for back pain.  Skin: Negative for rash.  Neurological: Negative for dizziness, speech change, focal weakness and headaches.  Endo/Heme/Allergies: Does not bruise/bleed easily.  Psychiatric/Behavioral: Negative for depression.    DRUG ALLERGIES:   Allergies  Allergen Reactions  . Contrast Media [Iodinated Diagnostic Agents] Shortness Of Breath    Hypoxia/hypotension  . Versed [Midazolam] Shortness Of Breath and Other (See Comments)    Hypotension   VITALS:  Blood pressure 131/69, pulse 89, temperature 98.1 F (36.7 C), temperature source Oral, resp. rate 20, height 5\' 7"  (1.702 m), weight 121.8 kg, SpO2 98 %. PHYSICAL EXAMINATION:  Physical Exam  Constitutional: He is oriented to person, place, and time.  HENT:  Head: Normocephalic and atraumatic.  Eyes: Pupils are equal, round, and reactive to light. Conjunctivae and  EOM are normal.  Neck: Normal range of motion. Neck supple. No tracheal deviation present. No thyromegaly present.  Cardiovascular: Normal rate, regular rhythm and normal heart sounds.  Pulmonary/Chest: Effort normal. No respiratory distress. He has decreased breath sounds. He has no wheezes. He exhibits no tenderness.  Abdominal: Soft. Bowel sounds are normal. He exhibits no distension. There is no tenderness.  Musculoskeletal: Normal range of motion.       Right lower leg: He exhibits edema.       Left lower leg: He exhibits edema.  Neurological: He is alert and oriented to person, place, and time. No cranial nerve deficit.  Skin: Skin is warm and dry. No rash noted.  Has pressure ulcer on sacrum with dressing in place Has skin breakdown on both legs with some transparent dressing present and some small area of yellowish collection underneath the dressing both side.   LABORATORY PANEL:  Male CBC Recent Labs  Lab 05/24/18 0536  WBC 8.9  HGB 8.4*  HCT 28.1*  PLT 215   ------------------------------------------------------------------------------------------------------------------ Chemistries  Recent Labs  Lab 05/22/18 1852  05/23/18 2138  05/25/18 0645  NA 138   < > 133*   < > 134*  K 5.6*   < > 4.5   < > 3.7  CL 103   < > 98   < > 99  CO2 24   < > 26   < > 26  GLUCOSE 245*   < > 167*   < > 142*  BUN 55*   < > 58*   < > 44*  CREATININE 2.01*   < > 1.71*   < > 1.22  CALCIUM  7.4*   < > 8.2*   < > 8.2*  MG 1.8  --  1.9  --   --   AST 39  --   --   --   --   ALT 68*  --   --   --   --   ALKPHOS 76  --   --   --   --   BILITOT 1.1  --   --   --   --    < > = values in this interval not displayed.   RADIOLOGY:  No results found. ASSESSMENT AND PLAN:  72 y m with acute hypoxic resp failure  * Acute Hypoxic Resp failure - due to COPD/CHF exacerbation Clinically improving  * hyperkalemia   Lokelma, nephrologist is following Potassium at 3.7  * Bradycardia,  Hypotension  required temporary pacer and dopamine, Improved. No indication for permanent pacemaker per cardiology and signed off  * Acute on chronic systolic dysfunction congestive heart failure with pulmonary edema  - continue aggressive Diuresis - Appreciate Cardio input, echo - EF 45-50% - renal func worse, stopped lasix. -Recommending to continue Plavix to prevent in-stent thrombosis and Eliquis for stroke prevention Recommending to continue metoprolol tartrate at low-dose Patient qualified for 2 L of oxygen via nasal cannula  * Ac renal failure-cardiorenal syndrome   Stop lasix, lisinopril, ultrasound renal without obstruction. Nephrology is following, significantly improved Creatinine 2.09-1.7.-1.22 Avoid nephrotoxins   *Chronic sleep apnea Patient uses CPAP at night but not consistently and falls asleep during daytime as he feels tired for not being able to use the CPAP mask during nights    *Diabetes mellitus - insulin Lantus, continue sliding scale -Diabetic nurse consult -Stop metformin due to renal failure.  * Elevated troponin - likely due to demand ischemia, monitor on tele  * Sinus tach: continue Metoprolol  *Pressure ulcer - on sacrum with stage 2 pressure injury Per wound care nurse which I agree with  Measurement: .2X.2X.1cm Wound bed: dark red Drainage (amount, consistency, odor) no odor or drainage Periwound: intact skin surrounding Wound care nurse recommends Foam dressing to protect from further injury.  - left chest wound - Chronic full thickness wound with packing; mod amt green-tinged drainage, some odor.   .3X.3X3cm Surrounded by red macerated skin in the skin fold ,wet  linear partial thickness fissure; .2X3X.1cm, red and moist, small amt tan drainage, no odor Wound care nurse recommends - narrow strip of Aquacel and packed with a swab to absorb drainage and provide antimicrobial benefits.  Pt can resume previous plan of care with home health  nursing after discharge.  * Hypertension   Hold lisinopril due ot renal failure  * Hyperkalemia     Discontinued potassium supplements and Lokelma as potassium is at 3.7.   Generalized weakness offered physical therapy assistance to patient 05/24/18 patient refused was about to discharge patient today with home health PT RN , but his wife called stated that she is really concerned that patient has to stay alone and he is very weak and convinced the patient to get PT assessment , after talking to his wife patient agreed for PT assessment .  Ordered physical therapy  All the records are reviewed and case discussed with Care Management/Social Worker. Management plans discussed with the patient, patient's wife over phone, nursing and they are in agreement.  CODE STATUS: Full Code  TOTAL TIME TAKING CARE OF THIS PATIENT: 35 minutes.  His wife is admitted in [facility] and  need updates.  More than 50% of the time was spent in counseling/coordination of care: YES  POSSIBLE D/C IN 1-2 DAYS, DEPENDING ON CLINICAL CONDITION.   [name] M.D on 05/25/2018 at 3:24 PM  Between 7am to 6pm - Pager - [phone]  After 6pm go to [email or web address] - Proofreader  [facility]  Office  [phone]  CC: Primary care physician; Idelle Crouch, MD  Note: This dictation was prepared with Dragon dictation along with smaller phrase technology. Any transcriptional errors that result from this process are unintentional.

## 2018-05-25 NOTE — Progress Notes (Signed)
Patient more awake now with MD at bedside. MD asked patient if he wanted/felt safe to go home today. Patient stated yes, he definitely wanted to home. Stated "I am tired of not being able to go home" Patient ambulated to the bathroom with walker. A little unsteady but no complaints of pain/dizziness. Dr. Margaretmary Eddy notified, per MD she will talk to case manager about setting him up with home health. Verbal orders to DC telesitter. Will continue to monitor patient.

## 2018-05-25 NOTE — Care Management Note (Signed)
Case Management Note  Patient Details  Name: [place] MRN: [id number] Date of Birth: 06-21-1955  Subjective/Objective:    Patient's wife now stating patient needs SNF placement.  RNCM to bedside and patient is agreeable.  MD ordered PT evaluation.  Randall Hiss, CSW has been made aware.                  Action/Plan:   Expected Discharge Date:  05/25/18               Expected Discharge Plan:  [place]  In-House Referral:     Discharge planning Services  CM Consult  Post Acute Care Choice:  Resumption of Svcs/PTA Provider Choice offered to:     DME Arranged:    DME Agency:     HH Arranged:    [place] Agency:  [facility]  Status of Service:  Completed, signed off  If discussed at [place] of Stay Meetings, dates discussed:    Additional Comments:  Elza Rafter, RN 05/25/2018, 3:31 PM

## 2018-05-25 NOTE — Progress Notes (Signed)
IV team placed peripheral iv and took out right femoral central line. No issues or complaints, will continue to monitor patient.

## 2018-05-25 NOTE — Plan of Care (Signed)
Confused and impulsive with pulling at tubes and removing CPAP hours after [place] given as requested.  Requires redirecting frequently.  Telesitter in place.

## 2018-05-25 NOTE — Care Management Note (Signed)
Case Management Note  Patient Details  Name: [place] MRN: [id number] Date of Birth: [birth year]  Subjective/Objective:   Patient is discharging to home today and resuming home health services with [facility].  Notified [facility].  Referral to [facility] for O2 for discharge as he has qualifying saturations.                   Action/Plan:   Expected Discharge Date:  05/25/18               Expected Discharge Plan:  [place]  In-House Referral:     Discharge planning Services  CM Consult  Post Acute Care Choice:  Resumption of Svcs/PTA Provider Choice offered to:     DME Arranged:    DME Agency:     HH Arranged:    [place] Agency:  [facility]  Status of Service:  Completed, signed off  If discussed at [place] of Stay Meetings, dates discussed:    Additional Comments:  Elza Rafter, RN 05/25/2018, 2:26 PM

## 2018-05-25 NOTE — Progress Notes (Signed)
SATURATION QUALIFICATIONS: (This note is used to comply with regulatory documentation for home oxygen)  Patient Saturations on Room Air at Rest = 94%  Patient Saturations on Room Air while Ambulating = 85%  Patient Saturations on 2 Liters of oxygen while Ambulating = 93%  Please briefly explain why patient needs home oxygen:

## 2018-05-25 NOTE — Progress Notes (Signed)
Right femoral site no complications, gauze with Tegaderm in place. No bleeding. Will continue to monitor.

## 2018-05-25 NOTE — Progress Notes (Signed)
Patient has had to be redirected multiple times to stay in the bed. Calls from tele-monitoring have been answered for patient attempting to get out of bed. Patient alert and oriented but very poor safety awareness with some confusion at times.

## 2018-05-25 NOTE — Care Management Important Message (Signed)
Copy of signed IM left with patient in room.  

## 2018-05-25 NOTE — Clinical Social Work Note (Signed)
CSW received referral for patient wanting SNF, awaiting PT recommendations, and patient will need insurance authorization.  Lizak Broom. [place], MSW, [place]  05/25/2018 5:22 PM

## 2018-05-25 NOTE — Progress Notes (Addendum)
Patient's wife Lovey Newcomer called regarding concerns about patient's discharge. Requested to speak to MD. MD Gouru notified, phone number given for her to call. Patient notified about wife's concern. Patient verbalized understanding but stated he still wanted to go home. Verbalized he had gone to rehab before and did not like it so he would not go this time if needed. Dr. Margaretmary Eddy to call patient's wife. Will wait for new orders. Will continue to monitor patient until then.    Update: Per MD, patient will be possibly be going to rehab instead, PT consult will be put in.

## 2018-05-25 NOTE — Progress Notes (Signed)
[facility]  ROUNDING NOTE   Subjective:   Moved to telemetry unit  Creatinine 1.22 (1.7)  Objective:  Vital signs in last 24 hours:  Temp:  [97.6 F (36.4 C)-98.1 F (36.7 C)] 98.1 F (36.7 C) (12/10 0807) Pulse Rate:  [72-89] 89 (12/10 0836) Resp:  [20] 20 (12/09 1148) BP: (124-141)/(69-80) 131/69 (12/10 0807) SpO2:  [95 %-100 %] 95 % (12/10 0836) Weight:  [121.8 kg] 121.8 kg (12/10 0513)  Weight change: 12.4 kg Filed Weights   05/23/18 0500 05/24/18 0500 05/25/18 0513  Weight: 121.5 kg 109.4 kg 121.8 kg    Intake/Output: I/O last 3 completed shifts: In: 600 [P.O.:600] Out: 3200 [Urine:3200]   Intake/Output this shift:  Total I/O In: 10 [I.V.:10] Out: -   Physical Exam: General: NAD, laying in bed  Head: Normocephalic, atraumatic. Moist oral mucosal membranes  Eyes: Anicteric, PERRL  Neck: Supple, trachea midline  Lungs:  Bilateral crackles  Heart: Regular rate and rhythm  Abdomen:  Soft, nontender, obese  Extremities: ++ peripheral edema.  Neurologic: Alert to self and place  Skin: No lesions        Basic Metabolic Panel: Recent Labs  Lab 05/22/18 1852 05/22/18 2142 05/23/18 0503 05/23/18 2138 05/24/18 0536 05/25/18 0645  NA 138 137 140 133* 132* 134*  K 5.6* 5.5* 5.3* 4.5 4.4 3.7  CL 103 99 103 98 98 99  CO2 24 26 27 26 28 26   GLUCOSE 245* 186* 140* 167* 114* 142*  BUN 55* 61* 64* 58* 58* 44*  CREATININE 2.01* 2.11* 2.09* 1.71* 1.70* 1.22  CALCIUM 7.4* 8.5* 8.2* 8.2* 8.0* 8.2*  MG 1.8  --   --  1.9  --   --   PHOS  --  6.0*  6.1*  --  4.0  --  3.3    Liver Function Tests: Recent Labs  Lab 05/19/18 0625 05/22/18 1852 05/22/18 2142 05/25/18 0645  AST 42* 39  --   --   ALT 141* 68*  --   --   ALKPHOS 105 76  --   --   BILITOT 1.4* 1.1  --   --   PROT 7.3 5.7*  --   --   ALBUMIN 3.2* 2.8* 3.2* 3.1*   No results for input(s): LIPASE, AMYLASE in the last 168 hours. No results for input(s): AMMONIA in the last 168  hours.  CBC: Recent Labs  Lab 05/19/18 0625 05/20/18 0420 05/21/18 0419 05/22/18 1852 05/23/18 0503 05/24/18 0536  WBC 5.5 9.2 14.2* 9.0 11.1* 8.9  NEUTROABS 3.6  --   --  7.2  --   --   HGB 9.3* 8.8* 10.0* 8.9* 9.2* 8.4*  HCT 32.3* 30.9* 35.0* 30.3* 31.0* 28.1*  MCV 80.3 80.5 81.8 78.9* 77.9* 77.6*  PLT 175 208 291 288 269 215    Cardiac Enzymes: Recent Labs  Lab 05/22/18 1852 05/22/18 2221 05/23/18 0031 05/23/18 0828 05/23/18 2138  TROPONINI 0.04* 0.05* 0.04* 0.05* 0.05*    BNP: Invalid input(s): POCBNP  CBG: Recent Labs  Lab 05/24/18 1613 05/24/18 2000 05/25/18 0031 05/25/18 0344 05/25/18 0808  GLUCAP 121* 133* 133* 151* 136*    Microbiology: Results for orders placed or performed during the hospital encounter of 05/19/18  MRSA PCR Screening     Status: None   Collection Time: 05/22/18  8:19 PM  Result Value Ref Range Status   MRSA by PCR NEGATIVE NEGATIVE Final    Comment:        The GeneXpert  MRSA Assay (FDA approved for NASAL specimens only), is one component of a comprehensive MRSA colonization surveillance program. It is not intended to diagnose MRSA infection nor to guide or monitor treatment for MRSA infections. Performed at [facility], [address] [place], [place] [address]   CULTURE, BLOOD (ROUTINE X 2) w Reflex to ID Panel     Status: None (Preliminary result)   Collection Time: 05/23/18  6:09 AM  Result Value Ref Range Status   Specimen Description BLOOD LEFT HAND  Final   Special Requests   Final    BOTTLES DRAWN AEROBIC ONLY Blood Culture adequate volume   Culture   Final    NO GROWTH 2 DAYS Performed at [facility], [address] [place],  [address]    Report Status PENDING  Incomplete  CULTURE, BLOOD (ROUTINE X 2) w Reflex to ID Panel     Status: None (Preliminary result)   Collection Time: 05/23/18  8:27 AM  Result Value Ref Range Status   Specimen Description BLOOD LAC  Final   Special  Requests   Final    BOTTLES DRAWN AEROBIC AND ANAEROBIC Blood Culture adequate volume   Culture   Final    NO GROWTH 2 DAYS Performed at [facility], [address], [place],  [address]    Report Status PENDING  Incomplete    Coagulation Studies: Recent Labs    05/22/18 1852 05/23/18 0503 05/23/18 2138  LABPROT 31.0* 26.9* 21.8*  INR 3.04 2.53 1.93    Urinalysis: No results for input(s): COLORURINE, LABSPEC, PHURINE, GLUCOSEU, HGBUR, BILIRUBINUR, KETONESUR, PROTEINUR, UROBILINOGEN, NITRITE, LEUKOCYTESUR in the last 72 hours.  Invalid input(s): APPERANCEUR    Imaging: No results found.   Medications:    . atorvastatin  40 mg Oral q1800  . atropine  0.5 mg Intravenous Once  . budesonide (PULMICORT) nebulizer solution  0.5 mg Nebulization BID  . clopidogrel  75 mg Oral Q breakfast  . DULoxetine  20 mg Oral Daily  . heparin injection (subcutaneous)  5,000 Units Subcutaneous Q8H  . insulin aspart  0-15 Units Subcutaneous Q4H  . ipratropium-albuterol  3 mL Nebulization Q6H  . metoprolol tartrate  25 mg Oral BID  . OLANZapine zydis  10 mg Oral Once  . pantoprazole  40 mg Oral BID  . sodium chloride flush  10-40 mL Intracatheter Q12H  . sodium chloride flush  3 mL Intravenous Q12H  . sodium zirconium cyclosilicate  10 g Oral BID   albuterol, chlorpheniramine-HYDROcodone, fentaNYL (SUBLIMAZE) injection, nitroGLYCERIN, ondansetron (ZOFRAN) IV, pramipexole, sodium chloride, sodium chloride flush, zolpidem  Assessment/ Plan:  Marcus Williams is a 62 y.o. white male with atrial fibrillation, aortic stenosis, coronary disease, chronic systolic heart failure, COPD, diabetes mellitus type 2, GERD, hypertension, obstructive sleep apnea, history of CVA, history of SVT, who was admitted to [facility] on12/09/2017   1. Acute renal failure with hyperkalemia, normal baseline Cr 0.6  Acute renal failure secondary to acute cardiorenal syndrome Holding lisinopril.  -  Restart torsemide - Discontinue sodium zirconium  2. Acute on chronic systolic heart failure exacerbation : off dopamine gtt.  - start fluid restriction - torsemide as above.   3. Anemia with renal failure: hemoglobin 8.4 - microcytic   LOS: 6 Marcus Williams 12/10/201911:45 AM

## 2018-05-26 LAB — GLUCOSE, CAPILLARY
Glucose-Capillary: 82 mg/dL (ref 70–99)
Glucose-Capillary: 87 mg/dL (ref 70–99)
Glucose-Capillary: 142 mg/dL — ABNORMAL HIGH (ref 70–99)
Glucose-Capillary: 208 mg/dL — ABNORMAL HIGH (ref 70–99)
Glucose-Capillary: 119 mg/dL — ABNORMAL HIGH (ref 70–99)

## 2018-05-26 LAB — RENAL FUNCTION PANEL
Albumin: 3.2 g/dL — ABNORMAL LOW (ref 3.5–5.0)
Anion gap: 9 (ref 5–15)
BUN: 33 mg/dL — ABNORMAL HIGH (ref 8–23)
CO2: 27 mmol/L (ref 22–32)
Calcium: 8.3 mg/dL — ABNORMAL LOW (ref 8.9–10.3)
Chloride: 101 mmol/L (ref 98–111)
Creatinine, Ser: 1.13 mg/dL (ref 0.61–1.24)
GFR calc Af Amer: >60 mL/min (ref >60)
GFR calc non Af Amer: >60 mL/min (ref >60)
Glucose, Bld: 94 mg/dL (ref 70–99)
Phosphorus: 2.9 mg/dL (ref 2.5–4.6)
Potassium: 3.3 mmol/L — ABNORMAL LOW (ref 3.5–5.1)
Sodium: 137 mmol/L (ref 135–145)

## 2018-05-26 MED ORDER — ACETAMINOPHEN 325 MG PO TABS
650.0000 mg | ORAL_TABLET | Freq: Four times a day (QID) | ORAL | Status: DC | PRN
  Administered 2018-05-26: 650 mg via ORAL
  Filled 2018-05-26: qty 2

## 2018-05-26 MED ORDER — APIXABAN 5 MG PO TABS
5.0000 mg | ORAL_TABLET | Freq: Two times a day (BID) | ORAL | Status: DC
  Administered 2018-05-26 – 2018-06-01 (×12): 5 mg via ORAL
  Filled 2018-05-26 (×12): qty 1

## 2018-05-26 MED ORDER — POLYETHYLENE GLYCOL 3350 17 G PO PACK
17.0000 g | PACK | Freq: Every day | ORAL | Status: DC
  Administered 2018-05-26 – 2018-06-01 (×6): 17 g via ORAL
  Filled 2018-05-26 (×7): qty 1

## 2018-05-26 MED ORDER — SENNA 8.6 MG PO TABS: 1.0000 | ORAL_TABLET | Freq: Every day | ORAL | Status: DC | PRN

## 2018-05-26 MED ORDER — POTASSIUM CHLORIDE CRYS ER 20 MEQ PO TBCR
40.0000 meq | EXTENDED_RELEASE_TABLET | Freq: Once | ORAL | Status: AC
  Administered 2018-05-26: 40 meq via ORAL
  Filled 2018-05-26: qty 2

## 2018-05-26 NOTE — Progress Notes (Signed)
[place] [facility] [place] NAME: Marcus Williams    MR#:  [id number]  DATE OF BIRTH:  Oct 19, 1955  SUBJECTIVE:  CHIEF COMPLAINT:   Chief Complaint  Patient presents with  . Shortness of Breath   Had bradycardia and hypotension, sent to ICU.  Patient had transcutaneous pacing which was temporary Not using CPAP mask as he should during night times and patient is sleepy during daytime.  Patient is feeling weak but refused physical therapy assessment and wants to go home with home health PT eventually changed his mind after talking to his wife After PT assessment SNF is recommended patient is agreeable  REVIEW OF SYSTEMS:  Review of Systems  Constitutional: Negative for chills, fever and weight loss.  HENT: Negative for nosebleeds and sore throat.   Eyes: Negative for blurred vision.  Respiratory: Positive for shortness of breath. Negative for cough and wheezing.   Cardiovascular: Positive for leg swelling. Negative for chest pain, orthopnea and PND.  Gastrointestinal: Negative for abdominal pain, constipation, diarrhea, heartburn, nausea and vomiting.  Genitourinary: Negative for dysuria and urgency.  Musculoskeletal: Negative for back pain.  Skin: Negative for rash.  Neurological: Negative for dizziness, speech change, focal weakness and headaches.  Endo/Heme/Allergies: Does not bruise/bleed easily.  Psychiatric/Behavioral: Negative for depression.    DRUG ALLERGIES:   Allergies  Allergen Reactions  . Contrast Media [Iodinated Diagnostic Agents] Shortness Of Breath    Hypoxia/hypotension  . Versed [Midazolam] Shortness Of Breath and Other (See Comments)    Hypotension   VITALS:  Blood pressure (!) 150/89, pulse 94, temperature 98 F (36.7 C), temperature source Axillary, resp. rate 20, height 5\' 7"  (1.702 m), weight 120.3 kg, SpO2 97 %. PHYSICAL EXAMINATION:  Physical Exam  Constitutional: He is oriented to person, place, and time.  HENT:   Head: Normocephalic and atraumatic.  Eyes: Pupils are equal, round, and reactive to light. Conjunctivae and EOM are normal.  Neck: Normal range of motion. Neck supple. No tracheal deviation present. No thyromegaly present.  Cardiovascular: Normal rate, regular rhythm and normal heart sounds.  Pulmonary/Chest: Effort normal. No respiratory distress. He has decreased breath sounds. He has no wheezes. He exhibits no tenderness.  Abdominal: Soft. Bowel sounds are normal. He exhibits no distension. There is no tenderness.  Musculoskeletal: Normal range of motion.       Right lower leg: He exhibits edema.       Left lower leg: He exhibits edema.  Neurological: He is alert and oriented to person, place, and time. No cranial nerve deficit.  Skin: Skin is warm and dry. No rash noted.  Has pressure ulcer on sacrum with dressing in place Has skin breakdown on both legs with some transparent dressing present and some small area of yellowish collection underneath the dressing both side.   LABORATORY PANEL:  Male CBC Recent Labs  Lab 05/24/18 0536  WBC 8.9  HGB 8.4*  HCT 28.1*  PLT 215   ------------------------------------------------------------------------------------------------------------------ Chemistries  Recent Labs  Lab 05/22/18 1852  05/23/18 2138  05/26/18 0543  NA 138   < > 133*   < > 137  K 5.6*   < > 4.5   < > 3.3*  CL 103   < > 98   < > 101  CO2 24   < > 26   < > 27  GLUCOSE 245*   < > 167*   < > 94  BUN 55*   < > 58*   < >  33*  CREATININE 2.01*   < > 1.71*   < > 1.13  CALCIUM 7.4*   < > 8.2*   < > 8.3*  MG 1.8  --  1.9  --   --   AST 39  --   --   --   --   ALT 68*  --   --   --   --   ALKPHOS 76  --   --   --   --   BILITOT 1.1  --   --   --   --    < > = values in this interval not displayed.   RADIOLOGY:  No results found. ASSESSMENT AND PLAN:  84 y m with acute hypoxic resp failure  * Acute Hypoxic Resp failure - due to COPD/CHF exacerbation Clinically  improving  * hyperkalemia initially but now hypokalemia   [place], nephrologist is following Potassium at 3.3-replete  * Bradycardia, Hypotension  required temporary pacer and dopamine, Improved. No indication for permanent pacemaker per cardiology and signed off  * Acute on chronic systolic dysfunction congestive heart failure with pulmonary edema  - continue aggressive Diuresis - Appreciate Cardio input, echo - EF 45-50% - renal func worse, stopped lasix. -Recommending to continue Plavix to prevent in-stent thrombosis and Eliquis for stroke prevention Recommending to continue metoprolol tartrate at low-dose Patient qualified for 2 L of oxygen via nasal cannula  * Ac renal failure-cardiorenal syndrome   Stop lasix, lisinopril, ultrasound renal without obstruction. Nephrology is following, significantly improved Creatinine 2.09-1.7.-1.22-1.13 Avoid nephrotoxins   *Chronic sleep apnea Patient uses CPAP at night but not consistently and falls asleep during daytime as he feels tired for not being able to use the CPAP mask during nights    *Diabetes mellitus - insulin Lantus, continue sliding scale -Diabetic nurse consult -Stop metformin due to renal failure.  * Elevated troponin - likely due to demand ischemia, monitor on tele  * Sinus tach: continue Metoprolol  *Pressure ulcer - on sacrum with stage 2 pressure injury Per wound care nurse which I agree with  Measurement: .2X.2X.1cm Wound bed: dark red Drainage (amount, consistency, odor) no odor or drainage Periwound: intact skin surrounding Wound care nurse recommends Foam dressing to protect from further injury.  - left chest wound - Chronic full thickness wound with packing; mod amt green-tinged drainage, some odor.   .3X.3X3cm Surrounded by red macerated skin in the skin fold ,wet  linear partial thickness fissure; .2X3X.1cm, red and moist, small amt tan drainage, no odor Wound care nurse recommends - narrow  strip of Aquacel and packed with a swab to absorb drainage and provide antimicrobial benefits.  Pt can resume previous plan of care with home health nursing after discharge.  * Hypertension   Hold lisinopril due ot renal failure   *Generalized weakness  offered physical therapy assistance to patient 05/24/18 patient refused was about to discharge patient on 03/2018 with home health PT RN , but his wife called stated that she is really concerned that patient has to stay alone and he is very weak and convinced the patient to get PT assessment , after talking to his wife patient agreed for PT assessment .   Patient seen by physical therapy today recommending skilled nursing facility.  Awaiting for insurance authorization for placement   All the records are reviewed and case discussed with Care Management/Social Worker. Management plans discussed with the patient, patient's wife over phone, nursing and they are in agreement.  CODE  STATUS: Full Code  TOTAL TIME TAKING CARE OF THIS PATIENT: 28 minutes.  His wife is admitted in [facility] and need updates.  More than 50% of the time was spent in counseling/coordination of care: YES  POSSIBLE D/C IN 1-2 DAYS, DEPENDING ON CLINICAL CONDITION.   Marcus Williams M.D on 05/26/2018 at 12:00 PM  Between 7am to 6pm - Pager - [phone]  After 6pm go to [email or web address] - Proofreader  [facility]  Office  [phone]  CC: Primary care physician; Idelle Crouch, MD  Note: This dictation was prepared with Dragon dictation along with smaller phrase technology. Any transcriptional errors that result from this process are unintentional.

## 2018-05-26 NOTE — NC FL2 (Signed)
[place] LEVEL OF CARE SCREENING TOOL     IDENTIFICATION  Patient Name: Marcus Williams Birthdate: [birth year] Sex: male Admission Date (Current Location): 05/19/2018  [place] and [place] Number:  Engineering geologist and Address:  [facility], [address], [place], [place] [address]      Provider Number: [id number]  Attending Physician Name and Address:  [name], MD  Relative Name and Phone Number:  Aithan, Farrelly [phone]  [phone] or Azalia Bilis Daughter   [phone]     Current Level of Care: Hospital Recommended Level of Care: [place] Prior Approval Number:    Date Approved/Denied:   PASRR Number: Pending  Discharge Plan: SNF    Current Diagnoses: Patient Active Problem List   Diagnosis Date Noted  . Pressure injury of skin 05/20/2018  . Acute respiratory failure with hypoxemia ([place]) 05/19/2018  . Unstable angina ([place]) 12/03/2017  . Elevated troponin 11/29/2017  . Pneumonia 08/10/2017  . NSTEMI (non-ST elevated myocardial infarction) ([place]) 07/28/2017  . TIA (transient ischemic attack) 03/01/2017  . Aortic stenosis 11/21/2015  . Acute on chronic diastolic CHF (congestive heart failure), NYHA class 3 ([place]) 11/21/2015  . Aortic valve, bicuspid 09/24/2015  . Cellulitis 05/04/2015  . Acute respiratory failure with hypoxia ([place]) 03/26/2015  . CHF (congestive heart failure) ([place]) 01/15/2015  . HTN (hypertension) 01/14/2015  . Type II diabetes mellitus ([place]) 01/14/2015  . COPD (chronic obstructive pulmonary disease) ([place]) 01/14/2015  . OSA on CPAP 01/14/2015  . CAD (coronary artery disease) 01/14/2015  . A-fib ([place]) 01/14/2015  . Pain of left calf 01/14/2015  . Acute on chronic systolic CHF (congestive heart failure) ([place]) 01/14/2015  . COPD exacerbation ([place]) 12/22/2014  . Diabetes ([place]) 10/14/2014    Orientation RESPIRATION BLADDER Height & Weight     Self, Time, Situation, Place  O2, Other (Comment)(2L during the day and CPAP at night) Continent Weight: 265 lb 4.8 oz (120.3 kg) Height:  5\' 7"  (170.2 cm)  BEHAVIORAL SYMPTOMS/MOOD NEUROLOGICAL BOWEL NUTRITION STATUS      Continent Diet(Carb Modified)  AMBULATORY STATUS COMMUNICATION OF NEEDS Skin   Limited Assist Verbally PU Stage and Appropriate Care, Surgical wounds   PU Stage 2 Dressing: (Every 3 days)                   Personal Care Assistance Level of Assistance  Bathing, Feeding, Dressing Bathing Assistance: Limited assistance Feeding assistance: Independent Dressing Assistance: Limited assistance     Functional Limitations Info  Sight, Hearing, Speech Sight Info: Adequate Hearing Info: Adequate Speech Info: Adequate    SPECIAL CARE FACTORS FREQUENCY  PT (By licensed PT), OT (By licensed OT)     PT Frequency: 5x a week OT Frequency: 5x a week            Contractures Contractures Info: Not present    Additional Factors Info  Code Status, Allergies, Psychotropic, Insulin Sliding Scale Code Status Info: Full Code Allergies Info: CONTRAST MEDIA IODINATED DIAGNOSTIC AGENTS, VERSED MIDAZOLAM  Psychotropic Info: OLANZapine zydis (ZYPREXA) disintegrating tablet 10 mg  Insulin Sliding Scale Info: insulin aspart (novoLOG) injection 0-15 Units Every 4 hours [place]       Current Medications (05/26/2018):  This is the current hospital active medication list Current Facility-Administered Medications  Medication Dose Route Frequency Provider Last Rate Last Dose  . albuterol (PROVENTIL) (2.5 MG/3ML) 0.083% nebulizer solution 2.5 mg  2.5 mg Nebulization Q6H PRN Vaughan Basta, MD   2.5 mg at  05/25/18 1024  . atorvastatin (LIPITOR) tablet 40 mg  40 mg Oral q1800 Max Sane, MD   40 mg at 05/25/18 1735  . atropine injection 0.5 mg  0.5 mg Intravenous Once Vaughan Basta, MD      . budesonide (PULMICORT) nebulizer solution 0.5 mg  0.5 mg Nebulization BID Tukov-Yual, Magdalene S, NP   0.5 mg  at 05/26/18 0708  . chlorpheniramine-HYDROcodone (TUSSIONEX) 10-8 MG/5ML suspension 5 mL  5 mL Oral Q6H PRN Vaughan Basta, MD   5 mL at 05/25/18 2123  . clopidogrel (PLAVIX) tablet 75 mg  75 mg Oral Q breakfast Max Sane, MD   75 mg at 05/26/18 0845  . DULoxetine (CYMBALTA) DR capsule 20 mg  20 mg Oral Daily Max Sane, MD   20 mg at 05/26/18 0846  . fentaNYL (SUBLIMAZE) injection 12.5 mcg  12.5 mcg Intravenous Q6H PRN Conforti, John, DO      . heparin injection 5,000 Units  5,000 Units Subcutaneous Q8H Vaughan Basta, MD   5,000 Units at 05/26/18 0527  . insulin aspart (novoLOG) injection 0-15 Units  0-15 Units Subcutaneous Q4H Tukov-Yual, Magdalene S, NP   2 Units at 05/26/18 1140  . ipratropium-albuterol (DUONEB) 0.5-2.5 (3) MG/3ML nebulizer solution 3 mL  3 mL Nebulization TID Gouru, Aruna, MD   3 mL at 05/26/18 1344  . metoprolol tartrate (LOPRESSOR) tablet 25 mg  25 mg Oral BID Isaias Cowman, MD   25 mg at 05/26/18 0845  . nitroGLYCERIN (NITROSTAT) SL tablet 0.4 mg  0.4 mg Sublingual Q5 min PRN Max Sane, MD      . OLANZapine zydis (ZYPREXA) disintegrating tablet 10 mg  10 mg Oral Once Tukov-Yual, Magdalene S, NP      . ondansetron (ZOFRAN) injection 4 mg  4 mg Intravenous Q6H PRN Gouru, Aruna, MD   4 mg at 05/23/18 1017  . pantoprazole (PROTONIX) EC tablet 40 mg  40 mg Oral BID Max Sane, MD   40 mg at 05/26/18 0845  . pramipexole (MIRAPEX) tablet 0.5 mg  0.5 mg Oral TID PRN Tukov-Yual, Magdalene S, NP      . sodium chloride (OCEAN) 0.65 % nasal spray 1 spray  1 spray Each Nare PRN Vaughan Basta, MD   1 spray at 05/21/18 0848  . sodium chloride flush (NS) 0.9 % injection 3 mL  3 mL Intravenous Q12H Vaughan Basta, MD   3 mL at 05/26/18 0846  . torsemide (DEMADEX) tablet 20 mg  20 mg Oral Daily Kolluru, Sarath, MD   20 mg at 05/26/18 0846  . zolpidem (AMBIEN) tablet 5 mg  5 mg Oral QHS PRN Max Sane, MD   5 mg at 05/24/18 2219     Discharge  Medications: Please see discharge summary for a list of discharge medications.  Relevant Imaging Results:  Relevant Lab Results:   Additional Information SSN [id number]  Ross Ludwig, [place]

## 2018-05-26 NOTE — Clinical Social Work Note (Signed)
CSW presented bed offers to Patient he chose [facility].  [facility] will start insurance authorization, awaiting insurance authorization and [place] number.  Patient is in agreement to going to SNF for short term rehab, formal assessment to followl  Randall Hiss R. [place], MSW, [place]  05/26/2018 7:22 PM

## 2018-05-26 NOTE — Evaluation (Signed)
Physical Therapy Evaluation Patient Details Name: Marcus Williams MRN: [id number] DOB: June 26, 1955 Today's Date: 05/26/2018   History of Present Illness  Pt is a 62 y.o. male presenting to hospital 05/19/18 with SOB.  Pt admitted with acute hypoxic respiratory failure secondary COPD/CHF exacerbation; pt also with stage 2 pressure injury to sacrum.  Pt with rapid response 05/22/18 secondary bradycardia (HR 30's) and hypotension (pt transferred to CCU and required temporary pacer).  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  Clinical Impression  Prior to hospital admission, pt was ambulatory with 4ww.  Pt lives with his wife (who is currently in the hospital) in 1 level home with ramp to enter.  Currently pt is SBA semi-supine to sit; mod assist to stand with RW; and CGA to ambulate 60 feet with RW.  Limited distance ambulating d/t increasing SOB, fatigue, and reports of LE weakness.  After ambulation (and a few minutes sitting rest break), attempted pt standing on own but pt unable to stand with 3 attempts and then pt reporting being too tired to try anymore.  Pt's O2 sats fluctuating between 80-90% after sitting on edge of bed (on room air) but O2 sats 93% on 2 L via nasal cannula with ambulation (nurse notified).  Overall pt demonstrating generalized weakness, decreased activity tolerance, and has new supplemental O2 needs.  Pt would benefit from skilled PT to address noted impairments and functional limitations (see below for any additional details).  Upon hospital discharge, recommend pt discharge to [place].    Follow Up Recommendations SNF    Equipment Recommendations  Rolling walker with 5" wheels    Recommendations for Other Services OT consult     Precautions / Restrictions Precautions Precautions: Fall Precaution Comments: chronic full thickness wound L chest Restrictions Weight Bearing Restrictions: No      Mobility  Bed Mobility Overal bed  mobility: Needs Assistance Bed Mobility: Supine to Sit;Sit to Supine     Supine to sit: Supervision;HOB elevated Sit to supine: Supervision;HOB elevated   General bed mobility comments: increased effort and time for pt to perform on own; heavy use of bedrail to go semi-supine to sitting; 2 assist to boost pt up in bed end of session (pt too fatigued to scoot up on own)  Transfers Overall transfer level: Needs assistance Equipment used: Rolling walker (2 wheeled) Transfers: Sit to/from Stand Sit to Stand: Mod assist         General transfer comment: assist to initiate and come to full stand 1st attempt; after ambulation pt tried to stand 3x's from bed on own but unable and reporting being too fatigued to try anymore (pt reports bed height similar to bed height at home)  Ambulation/Gait Ambulation/Gait assistance: Min guard Gait Distance (Feet): 60 Feet Assistive device: Rolling walker (2 wheeled)   Gait velocity: decreased   General Gait Details: partial step through gait pattern; steady with RW; limited distance d/t SOB and fatigue  Stairs Stairs: (pt declined d/t being too fatigued after walking; pt initially reporting steps to enter home but then pt reporting using ramp to enter home)          Wheelchair Mobility    Modified Rankin (Stroke Patients Only)       Balance Overall balance assessment: Needs assistance Sitting-balance support: No upper extremity supported;Feet supported Sitting balance-Leahy Scale: Normal Sitting balance - Comments: steady sitting reaching outside BOS   Standing balance support: No upper extremity supported Standing  balance-Leahy Scale: Good Standing balance comment: steady standing reaching within BOS                             Pertinent Vitals/Pain Pain Assessment: No/denies pain  HR WFL during session    Home Living Family/patient expects to be discharged to:: Private residence Living Arrangements:  Spouse/significant other(pt's wife currently in hospital)   Type of Home: House Home Access: Ramped entrance     Home Layout: One level Home Equipment: Environmental consultant - 2 wheels;Walker - 4 wheels;Cane - single point;Bedside commode;Shower seat;Grab bars - toilet;Grab bars - tub/shower      Prior Function Level of Independence: Needs assistance   Gait / Transfers Assistance Needed: Pt reports fall about 1 month ago and has been using 4ww since.  Pt was recently caring for his wife (who recently came home from rehab and now is back in the hospital)     Comments: Sleeps on 2 pillows.     Hand Dominance        Extremity/Trunk Assessment   Upper Extremity Assessment Upper Extremity Assessment: Generalized weakness    Lower Extremity Assessment Lower Extremity Assessment: Generalized weakness    Cervical / Trunk Assessment Cervical / Trunk Assessment: Kyphotic  Communication   Communication: No difficulties  Cognition Arousal/Alertness: Awake/alert Behavior During Therapy: WFL for tasks assessed/performed Overall Cognitive Status: Within Functional Limits for tasks assessed                                        General Comments General comments (skin integrity, edema, etc.): skin abrasions noted B LE's.  Nursing cleared pt for participation in physical therapy.  Pt agreeable to PT session.    Exercises  Transfer training   Assessment/Plan    PT Assessment Patient needs continued PT services  PT Problem List Decreased strength;Decreased activity tolerance;Decreased balance;Decreased mobility;Decreased knowledge of use of DME;Cardiopulmonary status limiting activity;Decreased skin integrity       PT Treatment Interventions DME instruction;Gait training;Functional mobility training;Therapeutic activities;Therapeutic exercise;Balance training;Patient/family education    PT Goals (Current goals can be found in the Care Plan section)  Acute Rehab PT  Goals Patient Stated Goal: to go home PT Goal Formulation: With patient Time For Goal Achievement: 06/09/18 Potential to Achieve Goals: Fair    Frequency Min 2X/week   Barriers to discharge Decreased caregiver support      Co-evaluation               AM-PAC PT "6 Clicks" Mobility  Outcome Measure Help needed turning from your back to your side while in a flat bed without using bedrails?: A Little Help needed moving from lying on your back to sitting on the side of a flat bed without using bedrails?: A Little Help needed moving to and from a bed to a chair (including a wheelchair)?: A Lot Help needed standing up from a chair using your arms (e.g., wheelchair or bedside chair)?: A Lot Help needed to walk in hospital room?: A Little Help needed climbing 3-5 steps with a railing? : Total 6 Click Score: 14    End of Session Equipment Utilized During Treatment: Gait belt(up high off of wound) Activity Tolerance: Patient limited by fatigue Patient left: in bed;with call bell/phone within reach;with bed alarm set Nurse Communication: Mobility status;Precautions PT Visit Diagnosis: Other abnormalities of gait and mobility (R26.89);Muscle  weakness (generalized) (M62.81);Difficulty in walking, not elsewhere classified (R26.2)    Time: 0104-0459 PT Time Calculation (min) (ACUTE ONLY): 27 min   Charges:   PT Evaluation $PT Eval Low Complexity: 1 Low PT Treatments $Therapeutic Activity: 8-22 mins       Leitha Bleak, PT 05/26/18, 10:21 AM [phone]

## 2018-05-26 NOTE — Progress Notes (Signed)
OT Cancellation Note  Patient Details Name: Marcus Williams MRN: [id number] DOB: 1955-12-10   Cancelled Treatment:    Reason Eval/Treat Not Completed: Fatigue/lethargy limiting ability to participate;Patient declined, no reason specified. Consult received, chart reviewed. Upon attempt, pt sleeping soundly. Difficult to wake, requiring verbal and tactile cues. Pt fell asleep several times t/o attempt and requested OT come back next date 2/2 fatigue and lower back pain. RN notified. Will re-attempt OT evaluation next date pending pt's willingness to participate and medical appropriateness.   Jeni Salles, MPH, MS, OTR/L ascom [phone] 05/26/18, 2:34 PM

## 2018-05-27 LAB — CBC
HCT: 29.5 % — ABNORMAL LOW (ref 39.0–52.0)
Hemoglobin: 8.4 g/dL — ABNORMAL LOW (ref 13.0–17.0)
MCH: 22.8 pg — ABNORMAL LOW (ref 26.0–34.0)
MCHC: 28.5 g/dL — ABNORMAL LOW (ref 30.0–36.0)
MCV: 79.9 fL — ABNORMAL LOW (ref 80.0–100.0)
Platelets: 204 10*3/uL (ref 150–400)
RBC: 3.69 MIL/uL — AB (ref 4.22–5.81)
RDW: 19.1 % — ABNORMAL HIGH (ref 11.5–15.5)
WBC: 6.6 10*3/uL (ref 4.0–10.5)
nRBC: 0 % (ref 0.0–0.2)

## 2018-05-27 LAB — GLUCOSE, CAPILLARY
GLUCOSE-CAPILLARY: 147 mg/dL — AB (ref 70–99)
Glucose-Capillary: 104 mg/dL — ABNORMAL HIGH (ref 70–99)
Glucose-Capillary: 113 mg/dL — ABNORMAL HIGH (ref 70–99)
Glucose-Capillary: 124 mg/dL — ABNORMAL HIGH (ref 70–99)
Glucose-Capillary: 129 mg/dL — ABNORMAL HIGH (ref 70–99)
Glucose-Capillary: 225 mg/dL — ABNORMAL HIGH (ref 70–99)

## 2018-05-27 MED ORDER — METOPROLOL TARTRATE 25 MG PO TABS: 25.0000 mg | ORAL_TABLET | Freq: Two times a day (BID) | ORAL | Status: AC

## 2018-05-27 MED ORDER — POLYETHYLENE GLYCOL 3350 17 G PO PACK: 17.0000 g | PACK | Freq: Every day | ORAL | 0 refills | Status: AC

## 2018-05-27 MED ORDER — POTASSIUM CHLORIDE ER 20 MEQ PO TBCR: 10.0000 meq | EXTENDED_RELEASE_TABLET | Freq: Every day | ORAL | 6 refills | Status: AC

## 2018-05-27 MED ORDER — ACETAMINOPHEN 325 MG PO TABS: 650.0000 mg | ORAL_TABLET | Freq: Four times a day (QID) | ORAL | Status: AC | PRN

## 2018-05-27 MED ORDER — OXYCODONE HCL 5 MG PO TABS: 5.0000 mg | ORAL_TABLET | Freq: Four times a day (QID) | ORAL | Status: DC | PRN

## 2018-05-27 MED ORDER — SALINE SPRAY 0.65 % NA SOLN: 1.0000 | NASAL | 0 refills | Status: AC | PRN

## 2018-05-27 MED ORDER — IPRATROPIUM-ALBUTEROL 0.5-2.5 (3) MG/3ML IN SOLN
3.0000 mL | Freq: Two times a day (BID) | RESPIRATORY_TRACT | Status: DC
  Administered 2018-05-27 – 2018-06-01 (×10): 3 mL via RESPIRATORY_TRACT
  Filled 2018-05-27 (×10): qty 3

## 2018-05-27 MED ORDER — OXYCODONE HCL 5 MG PO TABS: 5.0000 mg | ORAL_TABLET | Freq: Four times a day (QID) | ORAL | 0 refills | Status: DC | PRN

## 2018-05-27 MED ORDER — IPRATROPIUM-ALBUTEROL 0.5-2.5 (3) MG/3ML IN SOLN: 3.0000 mL | Freq: Two times a day (BID) | RESPIRATORY_TRACT | Status: AC

## 2018-05-27 MED ORDER — TORSEMIDE 20 MG PO TABS: 20.0000 mg | ORAL_TABLET | Freq: Every day | ORAL | Status: AC

## 2018-05-27 MED ORDER — BUDESONIDE 0.5 MG/2ML IN SUSP: 0.5000 mg | Freq: Two times a day (BID) | RESPIRATORY_TRACT | 12 refills | Status: AC

## 2018-05-27 MED ORDER — SENNA 8.6 MG PO TABS: 1.0000 | ORAL_TABLET | Freq: Every day | ORAL | 0 refills | Status: AC | PRN

## 2018-05-27 NOTE — Clinical Social Work Note (Signed)
Clinical Social Work Assessment  Patient Details  Name: Marcus Williams MRN: [id number] Date of Birth: [birth year]  Date of referral:  05/27/18               Reason for consult:  Facility Placement                Permission sought to share information with:  Facility Sport and exercise psychologist Permission granted to share information::  Yes, Verbal Permission Granted  Name::     Marcus, Williams [phone]  Williams   Agency::  SNF admissions  Relationship::     Contact Information:     Housing/Transportation Living arrangements for the past 2 months:  Single Family Home Source of Information:  Patient Patient Interpreter Needed:  None Criminal Activity/Legal Involvement Pertinent to Current Situation/Hospitalization:  No - Comment as needed Significant Relationships:  Spouse Lives with:  Spouse Do you feel safe going back to the place where you live?  No Need for family participation in patient care:  No (Coment)  Care giving concerns:  Patient and his wife feel he needs some short term rehab before he is able to return back home.  Social Worker assessment / plan:  Patient is a 62 year old male who is alert and oriented x4.  Patient was just recently discharged from [facility] where he was receiving short term rehab.  Patient was there for 18 days, and then was discharged due to insurance denying any more coverage.  Patient states he would like to return to [facility] if possible, CSW explained the process and role of CSW.  Patient was explain how insurance will pay for stay and what to expect for day of discharge.  Patient expressed he was familiar with what to expect, and did not express any other questions.  Patient gave CSW permission to begin bed search.   Employment status:  Retired Nurse, adult PT Recommendations:  [place] / Referral to community resources:  [place]  Patient/Family's Response to care:  Patient and family agreeable to going to SNF for short term rehab.  Patient/Family's Understanding of and Emotional Response to Diagnosis, Current Treatment, and Prognosis:  Patient and family are aware of current treatment plan.  Patient and his family are hopeful that he will not have to be at [facility] for very long.  Emotional Assessment Appearance:  Appears stated age Attitude/Demeanor/Rapport:    Affect (typically observed):  Appropriate, Stable, Calm Orientation:  Oriented to Self, Oriented to Place, Oriented to  Time, Oriented to Situation Alcohol / Substance use:  Not Applicable Psych involvement (Current and /or in the community):  No (Comment)  Discharge Needs  Concerns to be addressed:  Care Coordination, Lack of Support Readmission within the last 30 days:  No Current discharge risk:  Lack of support system Barriers to Discharge:  [place] Tour manager), Ship broker, Continued Medical Work up   Marcus Williams 05/27/2018, 5:26 PM

## 2018-05-27 NOTE — Evaluation (Signed)
Occupational Therapy Evaluation Patient Details Name: Marcus Williams MRN: [id number] DOB: 26-Jul-1955 Today's Date: 05/27/2018    History of Present Illness Pt is a 62 y.o. male presenting to hospital 05/19/18 with SOB.  Pt admitted with acute hypoxic respiratory failure secondary COPD/CHF exacerbation; pt also with stage 2 pressure injury to sacrum.  Pt with rapid response 05/22/18 secondary bradycardia (HR 30's) and hypotension (pt transferred to CCU and required temporary pacer).  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   Clinical Impression   Pt seen for OT evaluation this date. Prior to hospital admission, pt was independent with ADL and used RW for mobility following a fall. .  Pt lives with his spouse and is her caretaker. Pt  presented sidelying in bed with visible SOB. O2 86-87% with no O2. Put on CPAP with O2 levels at 90%. RN notified. RN came in and put pt on 2L of O2 via nasal cannula. pt O2 levels 96-99% for remainder of session.  Currently pt demonstrates impairments in (see OT Problem List below) requiring at least MIN assist for LB dressing/bathing tasks. Pt educated in energy conservation strategies and the importance of taking care of self. Emotional support provided. Pt verbalized understanding of all education presented at this date.  Pt would benefit from skilled OT to address noted impairments and functional limitations  in order to maximize safety and independence while minimizing falls risk and caregiver burden.  No further skilled OT session required after d/c.     Follow Up Recommendations  No OT follow up    Equipment Recommendations  None recommended by OT    Recommendations for Other Services       Precautions / Restrictions Precautions Precautions: Fall Precaution Comments: chronic full thickness wound L chest Restrictions Weight Bearing Restrictions: No      Mobility Bed Mobility Overal bed mobility: Needs  Assistance Bed Mobility: Supine to Sit;Sit to Supine     Supine to sit: Supervision;HOB elevated Sit to supine: Supervision   General bed mobility comments: Increased time effort required  Transfers Overall transfer level: Needs assistance Equipment used: Rolling walker (2 wheeled) Transfers: Sit to/from Stand Sit to Stand: Supervision(close supervision with gait belt )         General transfer comment: Pt refused use of walker to stand using countertop with one hand to stand. No physical assist required.     Balance Overall balance assessment: Needs assistance Sitting-balance support: No upper extremity supported;Feet supported Sitting balance-Leahy Scale: Normal     Standing balance support: Bilateral upper extremity supported Standing balance-Leahy Scale: Fair                             ADL either performed or assessed with clinical judgement   ADL Overall ADL's : Needs assistance/impaired                                       General ADL Comments: Pt requires at least MIN A for LB dressing/bathing tasks.      Vision Baseline Vision/History: No visual deficits Patient Visual Report: No change from baseline       Perception     Praxis      Pertinent Vitals/Pain Pain Assessment: No/denies pain     Hand Dominance     Extremity/Trunk Assessment  Upper Extremity Assessment Upper Extremity Assessment: Overall WFL for tasks assessed   Lower Extremity Assessment Lower Extremity Assessment: Generalized weakness;Defer to PT evaluation   Cervical / Trunk Assessment Cervical / Trunk Assessment: Kyphotic   Communication Communication Communication: No difficulties   Cognition Arousal/Alertness: Awake/alert Behavior During Therapy: WFL for tasks assessed/performed Overall Cognitive Status: Within Functional Limits for tasks assessed                                     General Comments  Skin abrasions/scabs on  BLE. One opened during session; notified RN for bandaid. Pt presented sidelying in bed with visible SOB. O2 86-87% with no O2. Put on CPAP with O2 levels at 90%. RN notified. RN came in and put pt on 2L of O2 via nasal cannula. pt O2 levels 96-99% for remainder of session.      Exercises Other Exercises Other Exercises: Pt educated in energy conservation strategies.  Other Exercises: Pt educated in the importance of self care and provided emotional support   Shoulder Instructions      Home Living Family/patient expects to be discharged to:: Private residence Living Arrangements: Spouse/significant other Available Help at Discharge: Family;Available PRN/intermittently Type of Home: House Home Access: Ramped entrance     Home Layout: One level     Bathroom Shower/Tub: Teacher, early years/pre: Handicapped height     Home Equipment: Environmental consultant - 2 wheels;Walker - 4 wheels;Cane - single point;Bedside commode;Shower seat;Grab bars - toilet;Grab bars - tub/shower          Prior Functioning/Environment Level of Independence: Independent with assistive device(s)  Gait / Transfers Assistance Needed: Pt uses a 4WW for mobility inside home and in communuity.  ADL's / Homemaking Assistance Needed: Pt states independent with all ADL tasks. Uses a CPAP machine at home only for sleeping.             OT Problem List: Decreased strength;Impaired balance (sitting and/or standing);Cardiopulmonary status limiting activity;Decreased activity tolerance;Decreased knowledge of use of DME or AE      OT Treatment/Interventions: Self-care/ADL training;Energy conservation;Balance training;Therapeutic exercise;Therapeutic activities;Patient/family education;DME and/or AE instruction    OT Goals(Current goals can be found in the care plan section) Acute Rehab OT Goals Patient Stated Goal: to feel better OT Goal Formulation: With patient Time For Goal Achievement: 06/10/18 Potential to Achieve  Goals: Good ADL Goals Additional ADL Goal #1: Pt will independently utilize at least 2 home modification strategies when completing ADL task for increased safety. Additional ADL Goal #2: Pt will independently utilize at least 3 energy conservation strategies when completing ADl tasks.  OT Frequency: Min 1X/week   Barriers to D/C:            Co-evaluation              AM-PAC OT "6 Clicks" Daily Activity     Outcome Measure Help from another person eating meals?: None Help from another person taking care of personal grooming?: None Help from another person toileting, which includes using toliet, bedpan, or urinal?: A Little Help from another person bathing (including washing, rinsing, drying)?: A Little Help from another person to put on and taking off regular upper body clothing?: A Little Help from another person to put on and taking off regular lower body clothing?: A Little 6 Click Score: 20   End of Session Equipment Utilized During Treatment: Gait belt  Activity Tolerance:  Patient tolerated treatment well Patient left: in bed;with call bell/phone within reach;with bed alarm set  OT Visit Diagnosis: Other abnormalities of gait and mobility (R26.89)                Time: 3837-7939 OT Time Calculation (min): 31 min Charges:     Jadene Pierini OTS  05/27/2018, 11:44 AM

## 2018-05-27 NOTE — Care Management Important Message (Signed)
Copy of signed IM left with patient in room.  

## 2018-05-27 NOTE — Discharge Instructions (Signed)
Follow-up with primary care physician at the facility in 3 days Follow-up with cardiology in a week
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
## 2018-05-27 NOTE — Discharge Summary (Signed)
[facility] [place] NAME: Marcus Williams    MR#:  [id number]  DATE OF BIRTH:  June 02, 1956  DATE OF ADMISSION:  05/19/2018 ADMITTING PHYSICIAN: Max Sane, MD  DATE OF DISCHARGE: 05/27/18  PRIMARY CARE PHYSICIAN: Idelle Crouch, MD    ADMISSION DIAGNOSIS:  Acute on chronic combined systolic and diastolic congestive heart failure (HCC) [I50.43] Acute on chronic systolic CHF (congestive heart failure) (HCC) [I50.23] Acute respiratory failure with hypoxemia (HCC) [J96.01]  DISCHARGE DIAGNOSIS:  Active Problems:   Acute respiratory failure with hypoxemia (HCC)   Pressure injury of skin Generalized weakness  SECONDARY DIAGNOSIS:   Past Medical History:  Diagnosis Date  . A-fib ([place])   . Aortic stenosis, moderate 11/2015  . Arthritis   . CAD (coronary artery disease)   . Chronic systolic CHF (congestive heart failure) ([place])   . COPD (chronic obstructive pulmonary disease) ([place])   . Diabetes mellitus without complication ([place])    INSULIN DEPENDENT  . GERD (gastroesophageal reflux disease)   . History of cardioversion   . Hypertension   . MI (myocardial infarction) ([place])   . OSA on CPAP   . Sepsis ([place])   . Shortness of breath dyspnea   . Stroke ([place])   . SVT (supraventricular tachycardia) [facility])     HOSPITAL COURSE:   * Acute Hypoxic Resp failure - due to COPD/CHF exacerbation Clinically improving, resolved now on room air  * hyperkalemia initially but now hypokalemia   Lokelma, nephrologist is following Potassium at 3.3-replete, recheck  * Bradycardia, Hypotension  required temporary pacer and dopamine, Improved. No indication for permanent pacemaker per cardiology and signed off  * Acute on chronic systolic dysfunction congestive heart failure with pulmonary edema - continue aggressive Diuresis - Appreciate Cardio input, echo - EF 45-50% - renal func worse, stopped lasix. -Recommending to continue Plavix to  prevent in-stent thrombosis and Eliquis for stroke prevention Recommending to continue metoprolol tartrate at low-dose Patient qualified for 2 L of oxygen via nasal cannula  * Ac renal failure-cardiorenal syndrome   Stop lasix, lisinopril, ultrasound renal without obstruction. Nephrology is following, significantly improved Creatinine 2.09-1.7.-1.22-1.13 Avoid nephrotoxins   *Chronic sleep apnea Patient uses CPAP at night but not consistently and falls asleep during daytime as he feels tired for not being able to use the CPAP mask during nights  *Diabetes mellitus - insulin Lantus, continue sliding scale -Seen by diabetic coordinator -Stop metformin due to renal failure.  * Elevated troponin - likely due to demand ischemia, monitor on tele  * Sinus tach: continue Metoprolol  *Pressure ulcer - on sacrum with stage 2 pressure injury Per wound care nurse which I agree with  Measurement:.2X.2X.1cm Wound VOZ:DGUY red Drainage (amount, consistency, odor)no odor or drainage Periwound:intact skin surrounding Wound care nurse recommendsFoam dressing to protect from further injury.  - left chest wound -Chronic full thickness wound with packing; mod amt green-tinged drainage, some odor.  .3X.3X3cm Surrounded by red macerated skin in the skin fold ,wet  linear partial thickness fissure; .2X3X.1cm, red and moist, small amt tan drainage, no odor Wound care nurse recommends - narrow strip of Aquacel and packed with a swab to absorb drainage and provide antimicrobial benefits. Pt can resume previous plan of care with home health nursing after discharge.  * Hypertension   Hold lisinopril due ot renal failure   *Generalized weakness    Patient seen by physical therapy , recommending skilled nursing facility.  Awaiting for insurance authorization  for placement   Disposition skilled nursing facility  after insurance authorization  DISCHARGE CONDITIONS:   FAIR  CONSULTS  OBTAINED:  Treatment Team:  Corey Skains, MD Anthonette Legato, MD Isaias Cowman, MD Shelda Altes, MD   PROCEDURES   DRUG ALLERGIES:   Allergies  Allergen Reactions  . Contrast Media [Iodinated Diagnostic Agents] Shortness Of Breath    Hypoxia/hypotension  . Versed [Midazolam] Shortness Of Breath and Other (See Comments)    Hypotension    DISCHARGE MEDICATIONS:   Allergies as of 05/27/2018      Reactions   Contrast Media [iodinated Diagnostic Agents] Shortness Of Breath   Hypoxia/hypotension   Versed [midazolam] Shortness Of Breath, Other (See Comments)   Hypotension      Medication List    STOP taking these medications   baclofen 10 MG tablet Commonly known as:  LIORESAL   chlorpheniramine-HYDROcodone 10-8 MG/5ML Suer Commonly known as:  TUSSIONEX PENNKINETIC ER   diltiazem 180 MG 24 hr capsule Commonly known as:  CARDIZEM CD   insulin NPH-regular Human (70-30) 100 UNIT/ML injection   isosorbide mononitrate 30 MG 24 hr tablet Commonly known as:  IMDUR   lisinopril 10 MG tablet Commonly known as:  PRINIVIL,ZESTRIL   nitroGLYCERIN 0.4 MG SL tablet Commonly known as:  NITROSTAT     TAKE these medications   acetaminophen 325 MG tablet Commonly known as:  TYLENOL Take 2 tablets (650 mg total) by mouth every 6 (six) hours as needed for moderate pain (headache).   albuterol 108 (90 Base) MCG/ACT inhaler Commonly known as:  PROVENTIL HFA;VENTOLIN HFA Inhale 2 puffs into the lungs every 6 (six) hours as needed for wheezing or shortness of breath.   apixaban 5 MG Tabs tablet Commonly known as:  ELIQUIS Take 5 mg by mouth 2 (two) times daily.   atorvastatin 40 MG tablet Commonly known as:  LIPITOR Take 1 tablet (40 mg total) by mouth daily at 6 PM.   budesonide 0.5 MG/2ML nebulizer solution Commonly known as:  PULMICORT Take 2 mLs (0.5 mg total) by nebulization 2 (two) times daily.   clopidogrel 75 MG tablet Commonly known as:   PLAVIX Take 1 tablet (75 mg total) by mouth daily with breakfast.   DULoxetine 20 MG capsule Commonly known as:  CYMBALTA Take 20 mg by mouth daily.   Fluticasone-Salmeterol 250-50 MCG/DOSE Aepb Commonly known as:  ADVAIR Inhale 1 puff into the lungs 2 (two) times daily.   ipratropium-albuterol 0.5-2.5 (3) MG/3ML Soln Commonly known as:  DUONEB Take 3 mLs by nebulization 2 (two) times daily.   metFORMIN 1000 MG tablet Commonly known as:  GLUCOPHAGE Take 1,000 mg by mouth 2 (two) times daily with a meal.   metoprolol tartrate 25 MG tablet Commonly known as:  LOPRESSOR Take 1 tablet (25 mg total) by mouth 2 (two) times daily. What changed:    medication strength  how much to take   oxyCODONE 5 MG immediate release tablet Commonly known as:  Oxy IR/ROXICODONE Take 1-2 tablets (5-10 mg total) by mouth every 6 (six) hours as needed for moderate pain, severe pain or breakthrough pain.   pantoprazole 40 MG tablet Commonly known as:  PROTONIX Take 1 tablet (40 mg total) by mouth 2 (two) times daily.   polyethylene glycol packet Commonly known as:  MIRALAX / GLYCOLAX Take 17 g by mouth daily. Start taking on:  May 28, 2018   Potassium Chloride ER 20 MEQ Tbcr Take 10 mEq by mouth daily. What  changed:  how much to take   pramipexole 0.5 MG tablet Commonly known as:  MIRAPEX Take 0.5 mg by mouth 3 (three) times daily as needed.   senna 8.6 MG Tabs tablet Commonly known as:  SENOKOT Take 1 tablet (8.6 mg total) by mouth daily as needed for mild constipation.   sodium chloride 0.65 % Soln nasal spray Commonly known as:  OCEAN Place 1 spray into both nostrils as needed for congestion.   torsemide 20 MG tablet Commonly known as:  DEMADEX Take 1 tablet (20 mg total) by mouth daily. Start taking on:  May 28, 2018 What changed:  how much to take   zolpidem 5 MG tablet Commonly known as:  AMBIEN Take 1 tablet (5 mg total) by mouth at bedtime as needed for  sleep.            Durable Medical Equipment  (From admission, onward)         Start     Ordered   05/26/18 1052  For home use only DME Walker rolling  Once    Comments:  Rolling walker with 5 inch wheels  Question:  Patient needs a walker to treat with the following condition  Answer:  Weakness   05/26/18 1051   05/25/18 1425  For home use only DME oxygen  Once    Question Answer Comment  Mode or (Route) Nasal cannula   Liters per Minute 2   Frequency Continuous (stationary and portable oxygen unit needed)   Oxygen conserving device Yes   Oxygen delivery system Gas      05/25/18 1424           DISCHARGE INSTRUCTIONS:  Follow-up with primary care physician at the facility in 3 days Follow-up with cardiology in a week  DIET:  Cardiac diet and Diabetic diet  DISCHARGE CONDITION:  Fair  ACTIVITY:  Activity as tolerated  OXYGEN:  Home Oxygen: NO  Oxygen Delivery: room air  DISCHARGE LOCATION:  nursing home   If you experience worsening of your admission symptoms, develop shortness of breath, life threatening emergency, suicidal or homicidal thoughts you must seek medical attention immediately by calling 911 or calling your MD immediately  if symptoms less severe.  You Must read complete instructions/literature along with all the possible adverse reactions/side effects for all the Medicines you take and that have been prescribed to you. Take any new Medicines after you have completely understood and accpet all the possible adverse reactions/side effects.   Please note  You were cared for by a hospitalist during your hospital stay. If you have any questions about your discharge medications or the care you received while you were in the hospital after you are discharged, you can call the unit and asked to speak with the hospitalist on call if the hospitalist that took care of you is not available. Once you are discharged, your primary care physician will handle any  further medical issues. Please note that NO REFILLS for any discharge medications will be authorized once you are discharged, as it is imperative that you return to your primary care physician (or establish a relationship with a primary care physician if you do not have one) for your aftercare needs so that they can reassess your need for medications and monitor your lab values.     Today  Chief Complaint  Patient presents with  . Shortness of Breath   Patient is feeling much better.  Awaiting for insurance authorization to go to rehab  center  ROS: CONSTITUTIONAL: Denies fevers, chills. Denies any fatigue, weakness.  EYES: Denies blurry vision, double vision, eye pain. EARS, NOSE, THROAT: Denies tinnitus, ear pain, hearing loss. RESPIRATORY: Denies cough, wheeze, shortness of breath.  CARDIOVASCULAR: Denies chest pain, palpitations, edema.  GASTROINTESTINAL: Denies nausea, vomiting, diarrhea, abdominal pain. Denies bright red blood per rectum. GENITOURINARY: Denies dysuria, hematuria. ENDOCRINE: Denies nocturia or thyroid problems. HEMATOLOGIC AND LYMPHATIC: Denies easy bruising or bleeding. SKIN: Denies rash or lesion. MUSCULOSKELETAL: Denies pain in neck, back, shoulder, knees, hips or arthritic symptoms.  NEUROLOGIC: Denies paralysis, paresthesias.  PSYCHIATRIC: Denies anxiety or depressive symptoms.   VITAL SIGNS:  Blood pressure 132/84, pulse 90, temperature 97.8 F (36.6 C), temperature source Oral, resp. rate 20, height 5\' 7"  (1.702 m), weight 120.7 kg, SpO2 96 %.  I/O:    Intake/Output Summary (Last 24 hours) at 05/27/2018 1500 Last data filed at 05/27/2018 1346 Gross per 24 hour  Intake 240 ml  Output 1425 ml  Net -1185 ml    PHYSICAL EXAMINATION:  GENERAL:  62 y.o.-year-old patient lying in the bed with no acute distress.  EYES: Pupils equal, round, reactive to light and accommodation. No scleral icterus. Extraocular muscles intact.  HEENT: Head atraumatic,  normocephalic. Oropharynx and nasopharynx clear.  NECK:  Supple, no jugular venous distention. No thyroid enlargement, no tenderness.  LUNGS: Normal breath sounds bilaterally, no wheezing, rales,rhonchi or crepitation. No use of accessory muscles of respiration.  CARDIOVASCULAR: S1, S2 normal. No murmurs, rubs, or gallops.  ABDOMEN: Soft, non-tender, non-distended. Bowel sounds present.   EXTREMITIES: No pedal edema, cyanosis, or clubbing.  NEUROLOGIC: Awake, alert and oriented x3 sensation intact. Gait not checked.  PSYCHIATRIC: The patient is alert and oriented x 3.  SKIN: No obvious rash, lesion, or ulcer.   DATA REVIEW:   CBC Recent Labs  Lab 05/27/18 0459  WBC 6.6  HGB 8.4*  HCT 29.5*  PLT 204    Chemistries  Recent Labs  Lab 05/22/18 1852  05/23/18 2138  05/26/18 0543  NA 138   < > 133*   < > 137  K 5.6*   < > 4.5   < > 3.3*  CL 103   < > 98   < > 101  CO2 24   < > 26   < > 27  GLUCOSE 245*   < > 167*   < > 94  BUN 55*   < > 58*   < > 33*  CREATININE 2.01*   < > 1.71*   < > 1.13  CALCIUM 7.4*   < > 8.2*   < > 8.3*  MG 1.8  --  1.9  --   --   AST 39  --   --   --   --   ALT 68*  --   --   --   --   ALKPHOS 76  --   --   --   --   BILITOT 1.1  --   --   --   --    < > = values in this interval not displayed.    Cardiac Enzymes Recent Labs  Lab 05/23/18 2138  TROPONINI 0.05*    Microbiology Results  Results for orders placed or performed during the hospital encounter of 05/19/18  MRSA PCR Screening     Status: None   Collection Time: 05/22/18  8:19 PM  Result Value Ref Range Status   MRSA by PCR NEGATIVE NEGATIVE Final  Comment:        The GeneXpert MRSA Assay (FDA approved for NASAL specimens only), is one component of a comprehensive MRSA colonization surveillance program. It is not intended to diagnose MRSA infection nor to guide or monitor treatment for MRSA infections. Performed at [facility], [address] [place],  [place] [address]   CULTURE, BLOOD (ROUTINE X 2) w Reflex to ID Panel     Status: None (Preliminary result)   Collection Time: 05/23/18  6:09 AM  Result Value Ref Range Status   Specimen Description BLOOD LEFT HAND  Final   Special Requests   Final    BOTTLES DRAWN AEROBIC ONLY Blood Culture adequate volume   Culture   Final    NO GROWTH 4 DAYS Performed at [facility], [address], [place], [place] [address]    Report Status PENDING  Incomplete  CULTURE, BLOOD (ROUTINE X 2) w Reflex to ID Panel     Status: None (Preliminary result)   Collection Time: 05/23/18  8:27 AM  Result Value Ref Range Status   Specimen Description BLOOD LAC  Final   Special Requests   Final    BOTTLES DRAWN AEROBIC AND ANAEROBIC Blood Culture adequate volume   Culture   Final    NO GROWTH 4 DAYS Performed at [facility], [address] [place],  [address]    Report Status PENDING  Incomplete    RADIOLOGY:  No results found.  EKG:   Orders placed or performed during the hospital encounter of 05/19/18  . ED EKG  . ED EKG  . EKG 12-Lead  . EKG 12-Lead      Management plans discussed with the patient, family and they are in agreement.  CODE STATUS:     Code Status Orders  (From admission, onward)         Start     Ordered   05/22/18 2212  Full code  Continuous     05/22/18 2212        Code Status History    Date Active Date Inactive Code Status Order ID Comments User Context   12/03/2017 2125 12/05/2017 1529 Full Code [id number]  Hillary Bow, MD ED   11/29/2017 1422 11/30/2017 1853 Full Code [id number]  [name], MD ED   10/28/2017 1958 11/01/2017 1658 Full Code [id number]  Salary, Avel Peace, MD ED   10/22/2017 0551 10/24/2017 1801 Full Code [id number]  Arta Silence, MD ED   08/10/2017 2205 08/14/2017 1512 Full Code [id number]  Vaughan Basta, MD Inpatient   07/31/2017 2045 08/02/2017 1648 Full Code [id number]  Henreitta Leber, MD Inpatient   07/28/2017  2230 07/31/2017 1428 Full Code [id number]  Dustin Flock, MD Inpatient   03/01/2017 0203 03/01/2017 2004 Full Code [id number]  Hugelmeyer, Alexis, DO Inpatient   11/21/2015 1620 11/23/2015 1710 Full Code [id number]  Sherren Mocha, MD Inpatient   05/06/2015 1545 [id number] Full Code [id number]  Sharlotte Alamo, MD Inpatient   05/04/2015 1951 05/06/2015 1545 Full Code [id number]  Vaughan Basta, MD ED   01/14/2015 1416 01/15/2015 1514 Full Code [id number]  Lance Coon, MD Inpatient   12/22/2014 1825 12/23/2014 1612 Full Code [id number]  Epifanio Lesches, MD ED   10/14/2014 1214 10/15/2014 1350 Full Code [id number]  Alfonso Patten, RN Inpatient    Advance Directive Documentation     Most Recent Value  Type of Advance Directive  Healthcare Power of Attorney  Pre-existing out of facility DNR order (yellow form or  pink MOST form)  -  "MOST" Form in Place?  -      TOTAL TIME TAKING CARE OF THIS PATIENT: 42 minutes.   Note: This dictation was prepared with Dragon dictation along with smaller phrase technology. Any transcriptional errors that result from this process are unintentional.   @[facility] @  on 05/27/2018 at 3:00 PM  Between 7am to 6pm - Pager - [phone]  After 6pm go to [email or web address] - password [email or web address] [facility]  Office  [phone]  CC: Primary care physician; Idelle Crouch, MD

## 2018-05-27 NOTE — Plan of Care (Signed)

## 2018-05-27 NOTE — Progress Notes (Signed)
This am during assessment, patient on RA at 96%.  At thsi time, OT in to work with patient and patient found to be in upper 69's while resting in bed on RA.  Patient without c/o SOB at this time. Patient placed back on 2L [place].

## 2018-05-27 NOTE — Progress Notes (Signed)
[place] [facility] [place] NAME: Marcus Williams    MR#:  [id number]  DATE OF BIRTH:  12-12-1955  SUBJECTIVE:  CHIEF COMPLAINT:   Chief Complaint  Patient presents with  . Shortness of Breath  Resting comfortably no overnight events   Had bradycardia and hypotension, sent to ICU.  Patient had transcutaneous pacing which was temporary Not using CPAP mask as he should during night times and patient is sleepy during daytime.  Patient is feeling weak but refused physical therapy assessment and wants to go home with home health PT eventually changed his mind after talking to his wife After PT assessment SNF is recommended patient is agreeable  REVIEW OF SYSTEMS:  Review of Systems  Constitutional: Negative for chills, fever and weight loss.  HENT: Negative for nosebleeds and sore throat.   Eyes: Negative for blurred vision.  Respiratory: Negative for cough, shortness of breath and wheezing.   Cardiovascular: Positive for leg swelling. Negative for chest pain, orthopnea and PND.  Gastrointestinal: Negative for abdominal pain, constipation, diarrhea, heartburn, nausea and vomiting.  Genitourinary: Negative for dysuria and urgency.  Musculoskeletal: Negative for back pain.  Skin: Negative for rash.  Neurological: Negative for dizziness, speech change, focal weakness and headaches.  Endo/Heme/Allergies: Does not bruise/bleed easily.  Psychiatric/Behavioral: Negative for depression.    DRUG ALLERGIES:   Allergies  Allergen Reactions  . Contrast Media [Iodinated Diagnostic Agents] Shortness Of Breath    Hypoxia/hypotension  . Versed [Midazolam] Shortness Of Breath and Other (See Comments)    Hypotension   VITALS:  Blood pressure 132/84, pulse 90, temperature 97.8 F (36.6 C), temperature source Oral, resp. rate 20, height 5\' 7"  (1.702 m), weight 120.7 kg, SpO2 96 %. PHYSICAL EXAMINATION:  Physical Exam HENT:     Head: Normocephalic and  atraumatic.  Eyes:     Conjunctiva/sclera: Conjunctivae normal.     Pupils: Pupils are equal, round, and reactive to light.  Neck:     Musculoskeletal: Normal range of motion and neck supple.     Thyroid: No thyromegaly.     Trachea: No tracheal deviation.  Cardiovascular:     Rate and Rhythm: Normal rate and regular rhythm.     Heart sounds: Normal heart sounds.  Pulmonary:     Effort: Pulmonary effort is normal. No respiratory distress.     Breath sounds: Decreased breath sounds present. No wheezing.  Chest:     Chest wall: No tenderness.  Abdominal:     General: Bowel sounds are normal. There is no distension.     Palpations: Abdomen is soft.     Tenderness: There is no abdominal tenderness.  Musculoskeletal: Normal range of motion.     Right lower leg: Edema present.     Left lower leg: Edema present.  Skin:    General: Skin is warm and dry.     Findings: No rash.     Comments: Has pressure ulcer on sacrum with dressing in place Has skin breakdown on both legs with some transparent dressing present and some small area of yellowish collection underneath the dressing both side.  Neurological:     Mental Status: He is alert and oriented to person, place, and time.     Cranial Nerves: No cranial nerve deficit.    LABORATORY PANEL:  Male CBC Recent Labs  Lab 05/27/18 0459  WBC 6.6  HGB 8.4*  HCT 29.5*  PLT 204   ------------------------------------------------------------------------------------------------------------------ Chemistries  Recent Labs  Lab  05/22/18 1852  05/23/18 2138  05/26/18 0543  NA 138   < > 133*   < > 137  K 5.6*   < > 4.5   < > 3.3*  CL 103   < > 98   < > 101  CO2 24   < > 26   < > 27  GLUCOSE 245*   < > 167*   < > 94  BUN 55*   < > 58*   < > 33*  CREATININE 2.01*   < > 1.71*   < > 1.13  CALCIUM 7.4*   < > 8.2*   < > 8.3*  MG 1.8  --  1.9  --   --   AST 39  --   --   --   --   ALT 68*  --   --   --   --   ALKPHOS 76  --   --   --   --    BILITOT 1.1  --   --   --   --    < > = values in this interval not displayed.   RADIOLOGY:  No results found. ASSESSMENT AND PLAN:  53 y m with acute hypoxic resp failure  * Acute Hypoxic Resp failure - due to COPD/CHF exacerbation Clinically improving, resolved now on room air  * hyperkalemiainitially but now hypokalemia Lokelma, nephrologist is following Potassium at 3.3-replete, recheck  * Bradycardia, Hypotension required temporary pacer and dopamine, Improved. No indication for permanent pacemaker per cardiology and signed off  * Acute on chronic systolic dysfunction congestive heart failure with pulmonary edema - continue aggressive Diuresis - Appreciate Cardio input, echo - EF 45-50% - renal func worse, stopped lasix. -Recommending to continue Plavix to prevent in-stent thrombosis and Eliquis for stroke prevention Recommending to continue metoprolol tartrate at low-dose Patient qualified for 2 L of oxygen via nasal cannula  * Ac renal failure-cardiorenal syndrome Stop lasix, lisinopril, ultrasound renal without obstruction. Nephrology is following, significantly improved Creatinine 2.09-1.7.-1.22-1.13 Avoid nephrotoxins   *Chronic sleep apnea Patient uses CPAP at night but not consistently and falls asleep during daytime as he feels tired for not being able to use the CPAP mask during nights  *Diabetes mellitus - insulin Lantus, continue sliding scale -Seen by diabetic coordinator -Stop metformin due to renal failure.  * Elevated troponin - likely due to demand ischemia, monitor on tele  * Sinus tach: continue Metoprolol  *Pressure ulcer - on sacrum with stage 2 pressure injury Per wound care nurse which I agree with  Measurement:.2X.2X.1cm Wound VZD:GLOV red Drainage (amount, consistency, odor)no odor or drainage Periwound:intact skin surrounding Wound care nurse recommendsFoam dressing to protect from further injury.  - left  chest wound -Chronic full thickness wound with packing; mod amt green-tinged drainage, some odor.  .3X.3X3cm Surrounded by red macerated skin in the skin fold ,wet linear partial thickness fissure; .2X3X.1cm, red and moist, small amt tan drainage, no odor Wound care nurse recommends - narrow strip of Aquacel and packed with a swab to absorb drainage and provide antimicrobial benefits. Pt can resume previous plan of care with home health nursing after discharge.  * Hypertension Hold lisinopril due ot renal failure   *Generalized weakness  Patient seen by physical therapy , recommending skilled nursing facility. Awaiting for insurance authorization for placement   All the records are reviewed and case discussed with Care Management/Social Worker. Management plans discussed with the patient, patient's wife over phone, nursing and  they are in agreement.  CODE STATUS: Full Code  TOTAL TIME TAKING CARE OF THIS PATIENT: 28 minutes.  [facility] and need updates.  More than 50% of the time was spent in counseling/coordination of care: YES  POSSIBLE D/C IN 1-2 DAYS, DEPENDING ON CLINICAL CONDITION.   [name] M.D on 05/27/2018 at 3:30 PM  Between 7am to 6pm - Pager - [phone]  After 6pm go to [email or web address] - Proofreader  [employer] [facility]  Office  [phone]  CC: Primary care physician; Idelle Crouch, MD  Note: This dictation was prepared with Dragon dictation along with smaller phrase technology. Any transcriptional errors that result from this process are unintentional.

## 2018-05-27 NOTE — Clinical Social Work Note (Signed)
CSW received phone call from [facility], they have received insurance authorization, however patient's Passar number is still pending.  Patient has been assigned a QMHP to review patient.  Patient can not go to SNF until Passar number has been received per state regulations.  CSW continuing to follow patient's progress throughout discharge, CSW updated bedside nurse, physician, and patient.  Durnil Broom. [place], MSW, [place]  05/27/2018 4:01 PM

## 2018-05-28 LAB — CULTURE, BLOOD (ROUTINE X 2)
CULTURE: NO GROWTH
Culture: NO GROWTH
Special Requests: ADEQUATE
Special Requests: ADEQUATE

## 2018-05-28 LAB — GLUCOSE, CAPILLARY
GLUCOSE-CAPILLARY: 110 mg/dL — AB (ref 70–99)
GLUCOSE-CAPILLARY: 190 mg/dL — AB (ref 70–99)
Glucose-Capillary: 111 mg/dL — ABNORMAL HIGH (ref 70–99)
Glucose-Capillary: 133 mg/dL — ABNORMAL HIGH (ref 70–99)
Glucose-Capillary: 134 mg/dL — ABNORMAL HIGH (ref 70–99)
Glucose-Capillary: 154 mg/dL — ABNORMAL HIGH (ref 70–99)

## 2018-05-28 MED ORDER — GUAIFENESIN 100 MG/5ML PO SOLN
5.0000 mL | ORAL | Status: DC | PRN
  Administered 2018-05-29: 100 mg via ORAL
  Filled 2018-05-28 (×2): qty 5

## 2018-05-28 NOTE — Progress Notes (Addendum)
Pt does not have general admission PRN orders and pt requesting for robitussin. Notify [facility]. Will continue to monitor.  Update 0049: Dr. Jannifer Franklin ordered robitussin 100 mg/5 ml solution oral every 4 hours as needed to cough , to loosen phelgm. Will continue to monitor.  Update 0135: Unable to give pt was asleep. Will notify incoming nurse. Will continue to monitor.

## 2018-05-28 NOTE — Clinical Social Work Note (Addendum)
CSW received phone call from [place] at [phone], she said she was going to try to see patient today to move forward with the Passar approval number.  CSW was informed that she will call CSW back once she has met with patient.  4:15pm  CSW attempted to call Passar QMHP, left a message on voice mail, awaiting for call back from [facility] worker.  4:30pm  CSW received phone call from [facility], she reviewed patient, and should have passar review completed tomorrow.  CSW updated [facility], they can take patient over the weekend if he is medically ready for discharge and Passar number has been received.  Rascon Broom. [place], MSW, [place]  05/28/2018 4:18 PM

## 2018-05-28 NOTE — Progress Notes (Signed)
[facility] NAME: Marcus Williams    MR#:  [id number]  DATE OF BIRTH:  Apr 07, 1956  SUBJECTIVE:  CHIEF COMPLAINT:   Chief Complaint  Patient presents with  . Shortness of Breath  Resting comfortably no overnight events  No overnight events, awaiting for passar number Had bradycardia and hypotension, sent to ICU.  Patient had transcutaneous pacing which was temporary Not using CPAP mask as he should during night times and patient is sleepy during daytime.  Patient is feeling weak but refused physical therapy assessment and wants to go home with home health PT eventually changed his mind after talking to his wife After PT assessment SNF is recommended patient is agreeable  REVIEW OF SYSTEMS:  Review of Systems  Constitutional: Negative for chills, fever and weight loss.  HENT: Negative for nosebleeds and sore throat.   Eyes: Negative for blurred vision.  Respiratory: Negative for cough, shortness of breath and wheezing.   Cardiovascular: Positive for leg swelling. Negative for chest pain, orthopnea and PND.  Gastrointestinal: Negative for abdominal pain, constipation, diarrhea, heartburn, nausea and vomiting.  Genitourinary: Negative for dysuria and urgency.  Musculoskeletal: Negative for back pain.  Skin: Negative for rash.  Neurological: Negative for dizziness, speech change, focal weakness and headaches.  Endo/Heme/Allergies: Does not bruise/bleed easily.  Psychiatric/Behavioral: Negative for depression.    DRUG ALLERGIES:   Allergies  Allergen Reactions  . Contrast Media [Iodinated Diagnostic Agents] Shortness Of Breath    Hypoxia/hypotension  . Versed [Midazolam] Shortness Of Breath and Other (See Comments)    Hypotension   VITALS:  Blood pressure 127/79, pulse 94, temperature 97.6 F (36.4 C), temperature source Oral, resp. rate 20, height 5\' 7"  (1.702 m), weight 120.5 kg, SpO2 95 %. PHYSICAL EXAMINATION:  Physical  Exam HENT:     Head: Normocephalic and atraumatic.  Eyes:     Conjunctiva/sclera: Conjunctivae normal.     Pupils: Pupils are equal, round, and reactive to light.  Neck:     Musculoskeletal: Normal range of motion and neck supple.     Thyroid: No thyromegaly.     Trachea: No tracheal deviation.  Cardiovascular:     Rate and Rhythm: Normal rate and regular rhythm.     Heart sounds: Normal heart sounds.  Pulmonary:     Effort: Pulmonary effort is normal. No respiratory distress.     Breath sounds: Decreased breath sounds present. No wheezing.  Chest:     Chest wall: No tenderness.  Abdominal:     General: Bowel sounds are normal. There is no distension.     Palpations: Abdomen is soft.     Tenderness: There is no abdominal tenderness.  Musculoskeletal: Normal range of motion.     Right lower leg: Edema present.     Left lower leg: Edema present.  Skin:    General: Skin is warm and dry.     Findings: No rash.     Comments: Has pressure ulcer on sacrum with dressing in place Has skin breakdown on both legs with some transparent dressing present and some small area of yellowish collection underneath the dressing both side.  Neurological:     Mental Status: He is alert and oriented to person, place, and time.     Cranial Nerves: No cranial nerve deficit.    LABORATORY PANEL:  Male CBC Recent Labs  Lab 05/27/18 0459  WBC 6.6  HGB 8.4*  HCT 29.5*  PLT 204   ------------------------------------------------------------------------------------------------------------------  Chemistries  Recent Labs  Lab 05/22/18 1852  05/23/18 2138  05/26/18 0543  NA 138   < > 133*   < > 137  K 5.6*   < > 4.5   < > 3.3*  CL 103   < > 98   < > 101  CO2 24   < > 26   < > 27  GLUCOSE 245*   < > 167*   < > 94  BUN 55*   < > 58*   < > 33*  CREATININE 2.01*   < > 1.71*   < > 1.13  CALCIUM 7.4*   < > 8.2*   < > 8.3*  MG 1.8  --  1.9  --   --   AST 39  --   --   --   --   ALT 68*  --   --    --   --   ALKPHOS 76  --   --   --   --   BILITOT 1.1  --   --   --   --    < > = values in this interval not displayed.   RADIOLOGY:  No results found. ASSESSMENT AND PLAN:  54 y m with acute hypoxic resp failure  * Acute Hypoxic Resp failure - due to COPD/CHF exacerbation Clinically improving, resolved now on room air  * hyperkalemiainitially but now hypokalemia [place], nephrologist is following Potassium at 3.3-replete, recheck  * Bradycardia, Hypotension required temporary pacer and dopamine, Improved. No indication for permanent pacemaker per cardiology and signed off  * Acute on chronic systolic dysfunction congestive heart failure with pulmonary edema - continue aggressive Diuresis - Appreciate Cardio input, echo - EF 45-50% - renal func worse, stopped lasix. -Recommending to continue Plavix to prevent in-stent thrombosis and Eliquis for stroke prevention Recommending to continue metoprolol tartrate at low-dose Patient qualified for 2 L of oxygen via nasal cannula  * Ac renal failure-cardiorenal syndrome Stop lasix, lisinopril, ultrasound renal without obstruction. Nephrology is following, significantly improved Creatinine 2.09-1.7.-1.22-1.13 Avoid nephrotoxins   *Chronic sleep apnea Patient uses CPAP at night but not consistently and falls asleep during daytime as he feels tired for not being able to use the CPAP mask during nights  *Diabetes mellitus - insulin Lantus, continue sliding scale -Seen by diabetic coordinator -Stop metformin due to renal failure.  * Elevated troponin - likely due to demand ischemia, monitor on tele  * Sinus tach: continue Metoprolol  *Pressure ulcer - on sacrum with stage 2 pressure injury Per wound care nurse which I agree with  Measurement:.2X.2X.1cm Wound EVO:JJKK red Drainage (amount, consistency, odor)no odor or drainage Periwound:intact skin surrounding Wound care nurse recommendsFoam dressing to  protect from further injury.  - left chest wound -Chronic full thickness wound with packing; mod amt green-tinged drainage, some odor.  .3X.3X3cm Surrounded by red macerated skin in the skin fold ,wet linear partial thickness fissure; .2X3X.1cm, red and moist, small amt tan drainage, no odor Wound care nurse recommends - narrow strip of Aquacel and packed with a swab to absorb drainage and provide antimicrobial benefits. Pt can resume previous plan of care with home health nursing after discharge. Seen and evaluated by general surgery Dr. Peyton Najjar no wound debridement needed at this time.  Continue wound care no need of antibiotics either per surgery  * Hypertension Hold lisinopril due ot renal failure   *Generalized weakness  Patient seen by physical therapy , recommending skilled nursing facility.  Awaiting for insurance authorization/ PASSAR no  for placement   All the records are reviewed and case discussed with Care Management/Social Worker. Management plans discussed with the patient, patient's wife over phone, nursing and they are in agreement.  CODE STATUS: Full Code  TOTAL TIME TAKING CARE OF THIS PATIENT: 28 minutes.  His wife is admitted in [facility] and need updates.  More than 50% of the time was spent in counseling/coordination of care: YES  POSSIBLE D/C IN 1-2 DAYS, DEPENDING ON CLINICAL CONDITION.   [name] M.D on 05/28/2018 at 4:47 PM  Between 7am to 6pm - Pager - [phone]  After 6pm go to [email or web address] - Proofreader  [facility]  Office  [phone]  CC: Primary care physician; Idelle Crouch, MD  Note: This dictation was prepared with Dragon dictation along with smaller phrase technology. Any transcriptional errors that result from this process are unintentional.

## 2018-05-28 NOTE — Consult Note (Signed)
SURGICAL CONSULTATION NOTE   HISTORY OF PRESENT ILLNESS (HPI):  62 y.o. male admitted to the hospital due to heart failure. He was going to be discharged today to a nursing home and was found to have a left chest wound with secretions. Patient refers that he has had that wound since months ago and it was bigger and has been getting smaller with local care. Patient has been receiving loca care by wound care nurse. Denies fever.  Surgery is consulted by Dr. Margaretmary Eddy in this context for evaluation and management of open wound.  PAST MEDICAL HISTORY (PMH):  Past Medical History:  Diagnosis Date  . A-fib ([place])   . Aortic stenosis, moderate 11/2015  . Arthritis   . CAD (coronary artery disease)   . Chronic systolic CHF (congestive heart failure) ([place])   . COPD (chronic obstructive pulmonary disease) ([place])   . Diabetes mellitus without complication ([place])    INSULIN DEPENDENT  . GERD (gastroesophageal reflux disease)   . History of cardioversion   . Hypertension   . MI (myocardial infarction) ([place])   . OSA on CPAP   . Sepsis ([place])   . Shortness of breath dyspnea   . Stroke ([place])   . SVT (supraventricular tachycardia) ([facility])      PAST SURGICAL HISTORY ([place]):  Past Surgical History:  Procedure Laterality Date  . 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 BUNIONECTOMY    . CARDIAC CATHETERIZATION N/A 10/02/2015   Procedure: Coronary/Grafts Angiography;  Surgeon: Teodoro Spray, MD;  Location: [place] [facility];  Service: Cardiovascular;  Laterality: N/A;  . CARDIAC CATHETERIZATION N/A 11/21/2015   Procedure: Right and Left Heart Cath;  Surgeon: Sherren Mocha, MD;  Location: [place] [facility];  Service: Cardiovascular;  Laterality: N/A;  . CORONARY ARTERY BYPASS GRAFT    . CORONARY STENT INTERVENTION N/A 07/30/2017   Procedure: CORONARY STENT INTERVENTION;  Surgeon: Isaias Cowman, MD;  Location: [place] [facility];  Service: Cardiovascular;  Laterality: N/A;  . CORONARY STENT INTERVENTION N/A 10/23/2017   Procedure: CORONARY STENT INTERVENTION;  Surgeon: Yolonda Kida, MD;  Location: [place] [facility];  Service: Cardiovascular;  Laterality: N/A;  . KNEE SURGERY    . LEFT HEART CATH AND CORONARY ANGIOGRAPHY N/A 10/30/2017   Procedure: LEFT HEART CATH AND CORONARY ANGIOGRAPHY;  Surgeon: Teodoro Spray, MD;  Location: [place] [facility];  Service: Cardiovascular;  Laterality: N/A;  . LEFT HEART CATH AND CORS/GRAFTS ANGIOGRAPHY N/A 10/22/2017   Procedure: LEFT HEART CATH AND CORS/GRAFTS ANGIOGRAPHY;  Surgeon: Corey Skains, MD;  Location: [place] [facility];  Service: Cardiovascular;  Laterality: N/A;  . quadruple bypass    . RIGHT/LEFT HEART CATH AND CORONARY/GRAFT ANGIOGRAPHY N/A 07/30/2017   Procedure: RIGHT/LEFT HEART CATH AND CORONARY/GRAFT ANGIOGRAPHY;  Surgeon: Isaias Cowman, MD;  Location: [place] [facility];  Service: Cardiovascular;  Laterality: N/A;  . TEE WITHOUT CARDIOVERSION  11/21/2015  . TEE WITHOUT CARDIOVERSION N/A 11/21/2015   Procedure: TRANSESOPHAGEAL ECHOCARDIOGRAM (TEE);  Surgeon: Larey Dresser, MD;  Location: [place];  Service: Cardiovascular;  Laterality: N/A;  . TRACHEOSTOMY       MEDICATIONS:  Prior to Admission medications   Medication Sig Start Date End Date Taking? Authorizing Provider  albuterol (PROVENTIL HFA;VENTOLIN HFA) 108 (90 Base) MCG/ACT inhaler Inhale 2 puffs into the lungs every 6 (six) hours as needed for wheezing  or shortness of breath. 07/11/15  Yes Kasa, Maretta Bees, MD  apixaban (ELIQUIS) 5 MG TABS tablet Take 5 mg by mouth 2 (two) times daily.   Yes [provider]  atorvastatin (LIPITOR) 40 MG tablet Take 1 tablet (40 mg total) by mouth daily at 6 PM. 10/24/17  Yes Gouru, Aruna, MD  DULoxetine (CYMBALTA) 20 MG capsule Take 20 mg by mouth daily.   Yes [provider]   Fluticasone-Salmeterol (ADVAIR) 250-50 MCG/DOSE AEPB Inhale 1 puff into the lungs 2 (two) times daily. 11/30/17  Yes Gladstone Lighter, MD  insulin NPH-regular Human (NOVOLIN 70/30) (70-30) 100 UNIT/ML injection Inject 85-100 Units into the skin 2 (two) times daily with a meal. 85 units in the morning and 100 units in the evening. Patient gets this but has not been taking for a couple months   Yes [provider]  isosorbide mononitrate (IMDUR) 30 MG 24 hr tablet Take 1 tablet (30 mg total) by mouth daily. 12/06/17  Yes Gladstone Lighter, MD  lisinopril (PRINIVIL,ZESTRIL) 10 MG tablet Take 1 tablet (10 mg total) by mouth daily. 08/14/17  Yes Gladstone Lighter, MD  metFORMIN (GLUCOPHAGE) 1000 MG tablet Take 1,000 mg by mouth 2 (two) times daily with a meal.   Yes [provider]  metoprolol (LOPRESSOR) 100 MG tablet Take 100 mg by mouth 2 (two) times daily. 10/15/15  Yes [provider]  pantoprazole (PROTONIX) 40 MG tablet Take 1 tablet (40 mg total) by mouth 2 (two) times daily. 12/05/17  Yes [place]stone Lighter, MD  torsemide (DEMADEX) 20 MG tablet Take 2 tablets (40 mg total) by mouth daily. 08/14/17  Yes [place]stone Lighter, MD  zolpidem (AMBIEN) 5 MG tablet Take 1 tablet (5 mg total) by mouth at bedtime as needed for sleep. 11/01/17  Yes Fritzi Mandes, MD  acetaminophen (TYLENOL) 325 MG tablet Take 2 tablets (650 mg total) by mouth every 6 (six) hours as needed for moderate pain (headache). 05/27/18   [name], MD  baclofen (LIORESAL) 10 MG tablet Take 10 mg by mouth 2 (two) times daily as needed for muscle spasms.     [provider]  budesonide (PULMICORT) 0.5 MG/2ML nebulizer solution Take 2 mLs (0.5 mg total) by nebulization 2 (two) times daily. 05/27/18   [name], MD  chlorpheniramine-HYDROcodone (TUSSIONEX PENNKINETIC ER) 10-8 MG/5ML SUER Take 5 mLs by mouth every 12 (twelve) hours as needed. Patient not taking: Reported on 05/19/2018 05/06/18   Gregor Hams, MD  clopidogrel (PLAVIX) 75 MG tablet Take 1 tablet (75 mg total) by mouth daily with breakfast. Patient not taking: Reported on 05/19/2018 08/01/17   Max Sane, MD  diltiazem (CARDIZEM CD) 180 MG 24 hr capsule Take 180 mg by mouth 2 (two) times daily.    [provider]  ipratropium-albuterol (DUONEB) 0.5-2.5 (3) MG/3ML SOLN Take 3 mLs by nebulization 2 (two) times daily. 05/27/18   [name], MD  metoprolol tartrate (LOPRESSOR) 25 MG tablet Take 1 tablet (25 mg total) by mouth 2 (two) times daily. 05/27/18   [name], MD  nitroGLYCERIN (NITROSTAT) 0.4 MG SL tablet Place 0.4 mg under the tongue every 5 (five) minutes as needed for chest pain.    [provider]  oxyCODONE (OXY IR/ROXICODONE) 5 MG immediate release tablet Take 1-2 tablets (5-10 mg total) by mouth every 6 (six) hours as needed for moderate pain, severe pain or breakthrough pain. 05/27/18   [name], MD  polyethylene glycol (MIRALAX / GLYCOLAX) packet Take 17 g  by mouth daily. 05/28/18   [name], MD  Potassium Chloride ER 20 MEQ TBCR Take 10 mEq by mouth daily. 05/27/18   [name], MD  pramipexole (MIRAPEX) 0.5 MG tablet Take 0.5 mg by mouth 3 (three) times daily as needed.     [provider]  senna (SENOKOT) 8.6 MG TABS tablet Take 1 tablet (8.6 mg total) by mouth daily as needed for mild constipation. 05/27/18   Gouru, Illene Silver, MD  sodium chloride (OCEAN) 0.65 % SOLN nasal spray Place 1 spray into both nostrils as needed for congestion. 05/27/18   [name], MD  torsemide (DEMADEX) 20 MG tablet Take 1 tablet (20 mg total) by mouth daily. 05/28/18   [name], MD     ALLERGIES:  Allergies  Allergen Reactions  . Contrast Media [Iodinated Diagnostic Agents] Shortness Of Breath    Hypoxia/hypotension  . Versed [Midazolam] Shortness Of Breath and Other (See Comments)    Hypotension     SOCIAL HISTORY:  Social History   Socioeconomic History  . Marital status:  Married    Spouse name: Not on file  . Number of children: Not on file  . Years of education: Not on file  . Highest education level: Not on file  Occupational History  . Not on file  Social Needs  . Financial resource strain: Not on file  . Food insecurity:    Worry: Not on file    Inability: Not on file  . Transportation needs:    Medical: Not on file    Non-medical: Not on file  Tobacco Use  . Smoking status: Never Smoker  . Smokeless tobacco: Never Used  Substance and Sexual Activity  . Alcohol use: No  . Drug use: No  . Sexual activity: Yes  Lifestyle  . Physical activity:    Days per week: Not on file    Minutes per session: Not on file  . Stress: Not on file  Relationships  . Social connections:    Talks on phone: Not on file    Gets together: Not on file    Attends religious service: Not on file    Active member of club or organization: Not on file    Attends meetings of clubs or organizations: Not on file    Relationship status: Not on file  . Intimate partner violence:    Fear of current or ex partner: Not on file    Emotionally abused: Not on file    Physically abused: Not on file    Forced sexual activity: Not on file  Other Topics Concern  . Not on file  Social History Narrative  . Not on file    FAMILY HISTORY:  Family History  Problem Relation Age of Onset  . Stroke Mother   . Heart attack Mother   . Hypertension Mother   . Heart attack Father   . Hypertension Father   . Heart attack Brother        #1  . Diabetes Brother        #1  . Heart disease Brother        #2  . Lung disease Brother        #2  . Hypertension Brother        #2  . Diabetes Brother        #2     REVIEW OF SYSTEMS:  Constitutional: denies weight loss, fever, chills, or sweats  Eyes: denies any other vision changes, history of  eye injury  ENT: denies sore throat, hearing problems  Respiratory: positive for shortness of breath, wheezing  Cardiovascular: denies  chest pain, palpitations  Gastrointestinal: denies abdominal pain, N/V, or diarrhea Genitourinary: denies burning with urination or urinary frequency Musculoskeletal: denies any other joint pains or cramps  Skin: denies any other rashes or skin discolorations. Positive for pressure ulcer and left chest open wound.  Neurological: denies any other headache, dizziness, weakness  Psychiatric: denies any other depression, anxiety   All other review of systems were negative   VITAL SIGNS:  Temp:  [97.6 F (36.4 C)-98.7 F (37.1 C)] 97.6 F (36.4 C) (12/13 1630) Pulse Rate:  [90-104] 94 (12/13 1630) BP: (117-131)/(73-79) 127/79 (12/13 1630) SpO2:  [91 %-99 %] 95 % (12/13 1630) Weight:  [120.5 kg] 120.5 kg (12/13 0408)     Height: 5\' 7"  (170.2 cm) Weight: 120.5 kg BMI (Calculated): 41.59   INTAKE/OUTPUT:  This shift: Total I/O In: -  Out: 1100 [Urine:1100]  Last 2 shifts: @IOLAST2SHIFTS @   PHYSICAL EXAM:  Constitutional:  -- Normal body habitus  -- Awake, alert, and oriented x3  Eyes:  -- Pupils equally round and reactive to light  -- No scleral icterus  Ear, nose, and throat:  -- No jugular venous distension  Pulmonary:  -- No crackles  -- Equal breath sounds bilaterally but decreased -- Breathing non-labored at rest Cardiovascular:  -- S1, S2 present  -- No pericardial rubs Gastrointestinal:  -- Abdomen soft, nontender, non-distended, no guarding or rebound tenderness -- No abdominal masses appreciated, pulsatile or otherwise  Musculoskeletal and Integumentary:  -- Wounds. Left chest open wound that is 3 cm horizontally with 0.3cm deep. Granulation tissue with chronic drainage. No erythema.   -- Extremities: B/L UE and LE FROM, hands and feet warm, bilateral pitting edema  Neurologic:  -- Motor function: intact and symmetric -- Sensation: intact and symmetric   Labs:  CBC Latest Ref Rng & Units 05/27/2018 05/24/2018 05/23/2018  WBC 4.0 - 10.5 K/uL 6.6 8.9 11.1(H)   Hemoglobin 13.0 - 17.0 g/dL 8.4(L) 8.4(L) 9.2(L)  Hematocrit 39.0 - 52.0 % 29.5(L) 28.1(L) 31.0(L)  Platelets 150 - 400 K/uL 204 215 269   CMP Latest Ref Rng & Units 05/26/2018 05/25/2018 05/24/2018  Glucose 70 - 99 mg/dL 94 142(H) 114(H)  BUN 8 - 23 mg/dL 33(H) 44(H) 58(H)  Creatinine 0.61 - 1.24 mg/dL 1.13 1.22 1.70(H)  Sodium 135 - 145 mmol/L 137 134(L) 132(L)  Potassium 3.5 - 5.1 mmol/L 3.3(L) 3.7 4.4  Chloride 98 - 111 mmol/L 101 99 98  CO2 22 - 32 mmol/L 27 26 28   Calcium 8.9 - 10.3 mg/dL 8.3(L) 8.2(L) 8.0(L)  Total Protein 6.5 - 8.1 g/dL - - -  Total Bilirubin 0.3 - 1.2 mg/dL - - -  Alkaline Phos 38 - 126 U/L - - -  AST 15 - 41 U/L - - -  ALT 0 - 44 U/L - - -   Imaging studies:  EXAM: CHEST - 2 VIEW  COMPARISON:  Chest x-ray dated May 19, 2018.  FINDINGS: Stable cardiomegaly status post CABG. Mild pulmonary vascular congestion is unchanged. Unchanged left ventricular/aortic conduit in the left lower lobe. Unchanged left lower lobe atelectasis. No pleural effusion or pneumothorax. No acute osseous abnormality.  IMPRESSION: 1. Stable cardiomegaly and pulmonary vascular congestion. 2. Unchanged postsurgical changes and atelectasis in the left lower lobe.   Electronically Signed   By: Titus Dubin M.D.   On: 05/22/2018 11:08  Assessment/Plan:  62  y.o. male with left chest chronic wound. Wound is being care by wound nurse with adequate recommendations of local care with Aquacel. There is no sign of fluid collection that needs to be drained or tissue to be debrided.   Arnold Long, MD

## 2018-05-29 LAB — GLUCOSE, CAPILLARY
Glucose-Capillary: 111 mg/dL — ABNORMAL HIGH (ref 70–99)
Glucose-Capillary: 118 mg/dL — ABNORMAL HIGH (ref 70–99)
Glucose-Capillary: 149 mg/dL — ABNORMAL HIGH (ref 70–99)
Glucose-Capillary: 170 mg/dL — ABNORMAL HIGH (ref 70–99)
Glucose-Capillary: 172 mg/dL — ABNORMAL HIGH (ref 70–99)
Glucose-Capillary: 93 mg/dL (ref 70–99)
Glucose-Capillary: 98 mg/dL (ref 70–99)

## 2018-05-29 NOTE — Progress Notes (Signed)
[facility] [place] NAME: Marcus Williams    MR#:  [id number]  DATE OF BIRTH:  [birth year]  SUBJECTIVE:  CHIEF COMPLAINT:   Chief Complaint  Patient presents with  . Shortness of Breath  Resting comfortably no overnight events  Patient continues to be weak awaiting passer for discharge to skilled facility shortness of breath resolved  REVIEW OF SYSTEMS:  Review of Systems  Constitutional: Negative for chills, fever and weight loss.  HENT: Negative for nosebleeds and sore throat.   Eyes: Negative for blurred vision.  Respiratory: Negative for cough, shortness of breath and wheezing.   Cardiovascular: Positive for leg swelling. Negative for chest pain, orthopnea and PND.  Gastrointestinal: Negative for abdominal pain, constipation, diarrhea, heartburn, nausea and vomiting.  Genitourinary: Negative for dysuria and urgency.  Musculoskeletal: Negative for back pain.  Skin: Negative for rash.  Neurological: Negative for dizziness, speech change, focal weakness and headaches.  Endo/Heme/Allergies: Does not bruise/bleed easily.  Psychiatric/Behavioral: Negative for depression.    DRUG ALLERGIES:   Allergies  Allergen Reactions  . Contrast Media [Iodinated Diagnostic Agents] Shortness Of Breath    Hypoxia/hypotension  . Versed [Midazolam] Shortness Of Breath and Other (See Comments)    Hypotension   VITALS:  Blood pressure (!) 145/78, pulse 91, temperature 97.8 F (36.6 C), temperature source Oral, resp. rate 17, height 5\' 7"  (1.702 m), weight 119.7 kg, SpO2 100 %. PHYSICAL EXAMINATION:  Physical Exam HENT:     Head: Normocephalic and atraumatic.  Eyes:     Conjunctiva/sclera: Conjunctivae normal.     Pupils: Pupils are equal, round, and reactive to light.  Neck:     Musculoskeletal: Normal range of motion and neck supple.     Thyroid: No thyromegaly.     Trachea: No tracheal deviation.  Cardiovascular:     Rate and Rhythm: Normal  rate and regular rhythm.     Heart sounds: Normal heart sounds.  Pulmonary:     Effort: Pulmonary effort is normal. No respiratory distress.     Breath sounds: Decreased breath sounds present. No wheezing.  Chest:     Chest wall: No tenderness.  Abdominal:     General: Bowel sounds are normal. There is no distension.     Palpations: Abdomen is soft.     Tenderness: There is no abdominal tenderness.  Musculoskeletal: Normal range of motion.     Right lower leg: Edema present.     Left lower leg: Edema present.  Skin:    General: Skin is warm and dry.     Findings: No rash.     Comments: Has pressure ulcer on sacrum with dressing in place Has skin breakdown on both legs with some transparent dressing present and some small area of yellowish collection underneath the dressing both side.  Neurological:     Mental Status: He is alert and oriented to person, place, and time.     Cranial Nerves: No cranial nerve deficit.    LABORATORY PANEL:  Male CBC Recent Labs  Lab 05/27/18 0459  WBC 6.6  HGB 8.4*  HCT 29.5*  PLT 204   ------------------------------------------------------------------------------------------------------------------ Chemistries  Recent Labs  Lab 05/22/18 1852  05/23/18 2138  05/26/18 0543  NA 138   < > 133*   < > 137  K 5.6*   < > 4.5   < > 3.3*  CL 103   < > 98   < > 101  CO2 24   < >  26   < > 27  GLUCOSE 245*   < > 167*   < > 94  BUN 55*   < > 58*   < > 33*  CREATININE 2.01*   < > 1.71*   < > 1.13  CALCIUM 7.4*   < > 8.2*   < > 8.3*  MG 1.8  --  1.9  --   --   AST 39  --   --   --   --   ALT 68*  --   --   --   --   ALKPHOS 76  --   --   --   --   BILITOT 1.1  --   --   --   --    < > = values in this interval not displayed.   RADIOLOGY:  No results found. ASSESSMENT AND PLAN:  62 y m with acute hypoxic resp failure  * Acute Hypoxic Resp failure - due to COPD/CHF exacerbation Clinically improving, resolved now on room air  *  hyperkalemiainitially but now hypokalemia Lokelma, nephrologist is following Potassium now normal  * Bradycardia, Hypotension required temporary pacer and dopamine, Improved. No indication for permanent pacemaker per cardiology and signed off  * Acute on chronic systolic dysfunction congestive heart failure with pulmonary edema - continue aggressive Diuresis - Appreciate Cardio input, echo - EF 45-50% - renal func worse, stopped lasix. -Recommending to continue Plavix to prevent in-stent thrombosis and Eliquis for stroke prevention Recommending to continue metoprolol tartrate at low-dose   * Ac renal failure-cardiorenal syndrome Stop lasix, lisinopril, ultrasound renal without obstruction. Nephrology is following, significantly improved Creatinine 2.09-1.7.-1.22-1.13 Avoid nephrotoxins   *Chronic sleep apnea Patient uses CPAP at night but not consistently and falls asleep during daytime as he feels tired for not being able to use the CPAP mask during nights  *Diabetes mellitus - insulin Lantus, continue sliding scale -Seen by diabetic coordinator -Stop metformin due to renal failure.  * Elevated troponin - likely due to demand ischemia, monitor on tele  * Sinus tach: continue Metoprolol  *Pressure ulcer - on sacrum with stage 2 pressure injury Per wound care nurse which I agree with  Measurement:.2X.2X.1cm Wound RKY:HCWC red Drainage (amount, consistency, odor)no odor or drainage Periwound:intact skin surrounding Wound care nurse recommendsFoam dressing to protect from further injury.  - left chest wound -Chronic full thickness wound with packing; mod amt green-tinged drainage, some odor.  .3X.3X3cm Surrounded by red macerated skin in the skin fold ,wet linear partial thickness fissure; .2X3X.1cm, red and moist, small amt tan drainage, no odor Wound care nurse recommends - narrow strip of Aquacel and packed with a swab to absorb drainage and  provide antimicrobial benefits. Pt can resume previous plan of care with home health nursing after discharge. Seen and evaluated by general surgery Dr. Peyton Najjar no wound debridement needed at this time.  Continue wound care no need of antibiotics either per surgery  * Hypertension Hold lisinopril due ot renal failure   *Generalized weakness  Patient seen by physical therapy , recommending skilled nursing facility. Awaiting for insurance authorization/ PASSAR no  for placement   All the records are reviewed and case discussed with Care Management/Social Worker. Management plans discussed with the patient, patient's wife over phone, nursing and they are in agreement.  CODE STATUS: Full Code  TOTAL TIME TAKING CARE OF THIS PATIENT: 28 minutes.  His wife is admitted in [facility] and need updates.  More than 50% of the  time was spent in counseling/coordination of care: YES  POSSIBLE D/C IN 1-2 DAYS, DEPENDING ON CLINICAL CONDITION.   Dustin Flock M.D on 05/29/2018 at 2:58 PM  Between 7am to 6pm - Pager - [phone]  After 6pm go to [email or web address] - Proofreader  [facility]  Office  [phone]  CC: Primary care physician; Idelle Crouch, MD  Note: This dictation was prepared with Dragon dictation along with smaller phrase technology. Any transcriptional errors that result from this process are unintentional.

## 2018-05-29 NOTE — Clinical Social Work Note (Addendum)
The CSW has checked [facility] MUST for any updates to the patient's Level II PASRR status. [facility] [place] MUST shows the screen as still running. The CSW left a voicemail for the [facility] screening the patient and is waiting for call back. The CSW has updated the attending MD of the [facility] barrier for discharge. CSW will update as more information becomes available.  UPDATE: Zack Seal, [facility] will arrive in about 1 hour to assess the patient. The CSW has updated the attending MD. The hope is for discharge today to [facility] pending PASRR.  UPDATE: CSW provided documentation to Zack Seal in person. PASRR still pending. If the patient does not receive PASRR, today, he will not be able to discharge until a new [employer] auth is received per Otila Kluver at [facility]. CSW will continue to check [place] MUST for PASRR updates.  UPDATE: PASRR still pending as of 1425. CSW will check every hour until end of shift for updates.  UPDATE: PASRR still pending as of 1522. CSW will check every hour until end of shift for updates.  Santiago Bumpers, MSW, Latanya Presser [phone]

## 2018-05-29 NOTE — Progress Notes (Signed)
Occupational Therapy Treatment Patient Details Name: Marcus Williams MRN: [id number] DOB: Feb 28, 1956 Today's Date: 05/29/2018    History of present illness Pt is a 62 y.o. male presenting to hospital 05/19/18 with SOB.  Pt admitted with acute hypoxic respiratory failure secondary COPD/CHF exacerbation; pt also with stage 2 pressure injury to sacrum.  Pt with rapid response 05/22/18 secondary bradycardia (HR 30's) and hypotension (pt transferred to CCU and required temporary pacer).  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   OT comments  Pt tolerated treatment well. Pt sitting in recliner when OT presents for tx. Pt, seated in chair demos good sitting balance, he states that he believes he can walk "on his own" to get to bathroom. OT stands close with gait belt and FWW in place when pt attempts sit to stand from recliner. Pt with slight LOB on initial stand noted and CGA provided with minimal correction to return to BOS. Pt performs fxl mobility with SUP to restroom. With use of grab bars, pt completes toilet t/f and simulates clothing mgt over hips with SUP only. Pt requires one cue for hand placement and MOD cues during fxl mobility to self-initiate standing rest breaks. Pt returns to chair with chair alarm. D/c plans remain appropriate as pt reports self care levels similar to prior level of fxn.    Follow Up Recommendations  No OT follow up    Equipment Recommendations  None recommended by OT    Recommendations for Other Services      Precautions / Restrictions Precautions Precautions: Fall Precaution Comments: chronic full thickness wound L chest Restrictions Weight Bearing Restrictions: No       Mobility Bed Mobility               General bed mobility comments: pt seated in recliner when OT presents for tx  Transfers Overall transfer level: Needs assistance Equipment used: Rolling walker (2 wheeled) Transfers: Sit to/from  Stand Sit to Stand: Supervision;Min guard(on initial stand, pt with slight instance of LOB requiring correction and CGA, pt proceeds with SUP with FWW )              Balance Overall balance assessment: Needs assistance Sitting-balance support: No upper extremity supported;Feet supported Sitting balance-Leahy Scale: Normal     Standing balance support: Bilateral upper extremity supported Standing balance-Leahy Scale: Fair Standing balance comment: Pt with slight LOB on initial stand, but overall F standing balance with use of FWW.                           ADL either performed or assessed with clinical judgement   ADL Overall ADL's : Needs assistance/impaired                     Lower Body Dressing: Min guard;Sit to/from stand Lower Body Dressing Details (indicate cue type and reason): with FWW for clothing mgt.  Toilet Transfer: Supervision/safety;Grab bars           Functional mobility during ADLs: Supervision/safety;Min guard;Rolling walker       Vision Baseline Vision/History: No visual deficits Patient Visual Report: No change from baseline     Perception     Praxis      Cognition Arousal/Alertness: Awake/alert Behavior During Therapy: WFL for tasks assessed/performed Overall Cognitive Status: Within Functional Limits for tasks assessed  Exercises Other Exercises Other Exercises: Pt educated on safety and fall prevention with verbalized understanding.  Other Exercises: Pt educated on EC strategies for ADL performance and verbalized understanding.    Shoulder Instructions       General Comments      Pertinent Vitals/ Pain       Pain Assessment: No/denies pain  Home Living                                          Prior Functioning/Environment              Frequency  Min 1X/week        Progress Toward Goals  OT Goals(current goals can now be  found in the care plan section)  Progress towards OT goals: Progressing toward goals  Acute Rehab OT Goals Patient Stated Goal: to feel better OT Goal Formulation: With patient Time For Goal Achievement: 06/10/18 Potential to Achieve Goals: Good  Plan Discharge plan remains appropriate    Co-evaluation                 AM-PAC OT "6 Clicks" Daily Activity     Outcome Measure   Help from another person eating meals?: None Help from another person taking care of personal grooming?: None Help from another person toileting, which includes using toliet, bedpan, or urinal?: A Little Help from another person bathing (including washing, rinsing, drying)?: A Little Help from another person to put on and taking off regular upper body clothing?: A Little Help from another person to put on and taking off regular lower body clothing?: A Little 6 Click Score: 20    End of Session Equipment Utilized During Treatment: Gait belt  OT Visit Diagnosis: Other abnormalities of gait and mobility (R26.89)   Activity Tolerance Patient tolerated treatment well   Patient Left in chair;with chair alarm set   Nurse Communication          Time: 3536-1443 OT Time Calculation (min): 24 min  Charges: OT General Charges $OT Visit: 1 Visit OT Treatments $Self Care/Home Management : 8-22 mins $Therapeutic Activity: 8-22 mins  Gerrianne Scale, MS, OTR/L ascom [phone] or [phone] 05/29/18, 2:33 PM

## 2018-05-30 LAB — GLUCOSE, CAPILLARY
Glucose-Capillary: 110 mg/dL — ABNORMAL HIGH (ref 70–99)
Glucose-Capillary: 106 mg/dL — ABNORMAL HIGH (ref 70–99)
Glucose-Capillary: 169 mg/dL — ABNORMAL HIGH (ref 70–99)

## 2018-05-30 MED ORDER — INSULIN ASPART 100 UNIT/ML ~~LOC~~ SOLN
0.0000 [IU] | Freq: Three times a day (TID) | SUBCUTANEOUS | Status: DC
  Administered 2018-05-30 – 2018-05-31 (×3): 3 [IU] via SUBCUTANEOUS
  Administered 2018-06-01: 5 [IU] via SUBCUTANEOUS
  Filled 2018-05-30 (×4): qty 1

## 2018-05-30 NOTE — Plan of Care (Signed)
  Problem: Nutrition: Goal: Adequate nutrition will be maintained Outcome: Progressing   Problem: Pain Managment: Goal: General experience of comfort will improve Outcome: Progressing Note:  No complaints of pain this shift   Problem: Safety: Goal: Ability to remain free from injury will improve Outcome: Progressing   Problem: Skin Integrity: Goal: Risk for impaired skin integrity will decrease Outcome: Progressing Note:  Multiple wounds, wound care following, daily dressing change to left chest wound   Problem: Education: Goal: Knowledge of General Education information will improve Description Including pain rating scale, medication(s)/side effects and non-pharmacologic comfort measures Outcome: Completed/Met

## 2018-05-30 NOTE — Progress Notes (Signed)
[facility] at [place] NAME: Marcus Williams    MR#:  [id number]  DATE OF BIRTH:  11/02/1955  SUBJECTIVE:  CHIEF COMPLAINT:   Chief Complaint  Patient presents with  . Shortness of Breath  Resting comfortably no overnight events  Patient awaiting passer for discharge REVIEW OF SYSTEMS:  Review of Systems  Constitutional: Negative for chills, fever and weight loss.  HENT: Negative for nosebleeds and sore throat.   Eyes: Negative for blurred vision.  Respiratory: Negative for cough, shortness of breath and wheezing.   Cardiovascular: Positive for leg swelling. Negative for chest pain, orthopnea and PND.  Gastrointestinal: Negative for abdominal pain, constipation, diarrhea, heartburn, nausea and vomiting.  Genitourinary: Negative for dysuria and urgency.  Musculoskeletal: Negative for back pain.  Skin: Negative for rash.  Neurological: Negative for dizziness, speech change, focal weakness and headaches.  Endo/Heme/Allergies: Does not bruise/bleed easily.  Psychiatric/Behavioral: Negative for depression.    DRUG ALLERGIES:   Allergies  Allergen Reactions  . Contrast Media [Iodinated Diagnostic Agents] Shortness Of Breath    Hypoxia/hypotension  . Versed [Midazolam] Shortness Of Breath and Other (See Comments)    Hypotension   VITALS:  Blood pressure 131/62, pulse 89, temperature (!) 97.4 F (36.3 C), temperature source Oral, resp. rate 17, height 5\' 7"  (1.702 m), weight 119.1 kg, SpO2 100 %. PHYSICAL EXAMINATION:  Physical Exam HENT:     Head: Normocephalic and atraumatic.  Eyes:     Conjunctiva/sclera: Conjunctivae normal.     Pupils: Pupils are equal, round, and reactive to light.  Neck:     Musculoskeletal: Normal range of motion and neck supple.     Thyroid: No thyromegaly.     Trachea: No tracheal deviation.  Cardiovascular:     Rate and Rhythm: Normal rate and regular rhythm.     Heart sounds: Normal heart sounds.    Pulmonary:     Effort: Pulmonary effort is normal. No respiratory distress.     Breath sounds: Decreased breath sounds present. No wheezing.  Chest:     Chest wall: No tenderness.  Abdominal:     General: Bowel sounds are normal. There is no distension.     Palpations: Abdomen is soft.     Tenderness: There is no abdominal tenderness.  Musculoskeletal: Normal range of motion.     Right lower leg: Edema present.     Left lower leg: Edema present.  Skin:    General: Skin is warm and dry.     Findings: No rash.     Comments: Has pressure ulcer on sacrum with dressing in place Has skin breakdown on both legs with some transparent dressing present and some small area of yellowish collection underneath the dressing both side.  Neurological:     Mental Status: He is alert and oriented to person, place, and time.     Cranial Nerves: No cranial nerve deficit.    LABORATORY PANEL:  Male CBC Recent Labs  Lab 05/27/18 0459  WBC 6.6  HGB 8.4*  HCT 29.5*  PLT 204   ------------------------------------------------------------------------------------------------------------------ Chemistries  Recent Labs  Lab 05/23/18 2138  05/26/18 0543  NA 133*   < > 137  K 4.5   < > 3.3*  CL 98   < > 101  CO2 26   < > 27  GLUCOSE 167*   < > 94  BUN 58*   < > 33*  CREATININE 1.71*   < > 1.13  CALCIUM  8.2*   < > 8.3*  MG 1.9  --   --    < > = values in this interval not displayed.   RADIOLOGY:  No results found. ASSESSMENT AND PLAN:  50 y m with acute hypoxic resp failure  * Acute Hypoxic Resp failure - due to COPD/CHF exacerbation Clinically improving, resolved now on room air  * hyperkalemiainitially but now hypokalemia Lokelma, nephrologist is following Potassium now normal  * Bradycardia, Hypotension required temporary pacer and dopamine, Improved. No indication for permanent pacemaker per cardiology and signed off  * Acute on chronic systolic dysfunction congestive  heart failure with pulmonary edema - continue aggressive Diuresis - Appreciate Cardio input, echo - EF 45-50% - renal func worse, stopped lasix. -Recommending to continue Plavix to prevent in-stent thrombosis and Eliquis for stroke prevention Recommending to continue metoprolol tartrate at low-dose   * Ac renal failure-cardiorenal syndrome Stop lasix, lisinopril, ultrasound renal without obstruction. Nephrology is following, significantly improved Creatinine 2.09-1.7.-1.22-1.13 Avoid nephrotoxins   *Chronic sleep apnea Patient uses CPAP at night but not consistently and falls asleep during daytime as he feels tired for not being able to use the CPAP mask during nights  *Diabetes mellitus - insulin Lantus, continue sliding scale -Seen by diabetic coordinator -Stop metformin due to renal failure.  * Elevated troponin - likely due to demand ischemia, monitor on tele  * Sinus tach: continue Metoprolol  *Pressure ulcer - on sacrum with stage 2 pressure injury Per wound care nurse which I agree with  Measurement:.2X.2X.1cm Wound RKY:HCWC red Drainage (amount, consistency, odor)no odor or drainage Periwound:intact skin surrounding Wound care nurse recommendsFoam dressing to protect from further injury.  - left chest wound -Chronic full thickness wound with packing; mod amt green-tinged drainage, some odor.  .3X.3X3cm Surrounded by red macerated skin in the skin fold ,wet linear partial thickness fissure; .2X3X.1cm, red and moist, small amt tan drainage, no odor Wound care nurse recommends - narrow strip of Aquacel and packed with a swab to absorb drainage and provide antimicrobial benefits. Pt can resume previous plan of care with home health nursing after discharge. Seen and evaluated by general surgery Dr. Peyton Najjar no wound debridement needed at this time.  Continue wound care no need of antibiotics either per surgery  * Hypertension Hold lisinopril due ot  renal failure   *Generalized weakness  Patient seen by physical therapy , recommending skilled nursing facility. Awaiting for insurance authorization/ PASSAR no  for placement   All the records are reviewed and case discussed with Care Management/Social Worker. Management plans discussed with the patient, patient's wife over phone, nursing and they are in agreement.  CODE STATUS: Full Code  TOTAL TIME TAKING CARE OF THIS PATIENT: 28 minutes.  His wife is admitted in [facility] and need updates.  More than 50% of the time was spent in counseling/coordination of care: YES  POSSIBLE D/C IN 1-2 DAYS, DEPENDING ON CLINICAL CONDITION.   Dustin Flock M.D on 05/30/2018 at 3:23 PM  Between 7am to 6pm - Pager - [phone]  After 6pm go to [email or web address] - Proofreader  [facility]  Office  [phone]  CC: Primary care physician; Idelle Crouch, MD  Note: This dictation was prepared with Dragon dictation along with smaller phrase technology. Any transcriptional errors that result from this process are unintentional.

## 2018-05-30 NOTE — Plan of Care (Signed)
  Problem: Health Behavior/Discharge Planning: °Goal: Ability to manage health-related needs will improve °Outcome: Progressing °  °Problem: Clinical Measurements: °Goal: Ability to maintain clinical measurements within normal limits will improve °Outcome: Progressing °Goal: Diagnostic test results will improve °Outcome: Progressing °  °Problem: Pain Managment: °Goal: General experience of comfort will improve °Outcome: Progressing °  °

## 2018-05-31 LAB — GLUCOSE, CAPILLARY
GLUCOSE-CAPILLARY: 185 mg/dL — AB (ref 70–99)
Glucose-Capillary: 158 mg/dL — ABNORMAL HIGH (ref 70–99)
Glucose-Capillary: 131 mg/dL — ABNORMAL HIGH (ref 70–99)
Glucose-Capillary: 102 mg/dL — ABNORMAL HIGH (ref 70–99)
Glucose-Capillary: 184 mg/dL — ABNORMAL HIGH (ref 70–99)
Glucose-Capillary: 119 mg/dL — ABNORMAL HIGH (ref 70–99)
Glucose-Capillary: 150 mg/dL — ABNORMAL HIGH (ref 70–99)

## 2018-05-31 NOTE — Clinical Social Work Note (Signed)
CSW received [employer] number, however insurance needs to [employer], awaiting for insurance authorization and updated PT notes.  Priestly Broom. [place], MSW, [place]  05/31/2018 1:12 PM

## 2018-05-31 NOTE — Progress Notes (Signed)
Physical Therapy Treatment Patient Details Name: Marcus Williams MRN: [id number] DOB: Feb 23, 1956 Today's Date: 05/31/2018    History of Present Illness Pt is a 62 y.o. male presenting to hospital 05/19/18 with SOB.  Pt admitted with acute hypoxic respiratory failure secondary COPD/CHF exacerbation; pt also with stage 2 pressure injury to sacrum.  Pt with rapid response 05/22/18 secondary bradycardia (HR 30's) and hypotension (pt transferred to CCU and required temporary pacer).  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    PT Comments    Patient alert, up in chair at start of session, agreeable to PT. Pt able to participate in therapeutic exercises with minimal verbal cues for activity pacing/exercise form. Pt desatted on room air to mid 23s with exertional activities, O2 at 2L via [place] utilized for remainder of session with nursing consent. Pt performed sit <> stand with CGA, needed to gather momentum and x2 tries to obtain upright positioning, heavy use of UE. Pt ambulated a total of ~132ft with RW and CGA, decreased gait velocity. Pt exhibited SOB, several standing rest breaks needed to improve spO2>92%. Pt eager to continue to mobilize despite education on breathing technique and importance of maintaining spO2 sats. As ambulation continued, pt began to exhibit more gait deviations including increased L foot slap, wide BOS, shakiness, as well as SOB. Overall the patient demonstrated improved activity tolerance, mobility, and gait compared to previous sessions. Recommendation for STR still appropriate to continue to progress pt to PLOF as well as decreased caregiver support and increased fall risk.      Follow Up Recommendations  SNF     Equipment Recommendations  Rolling walker with 5" wheels    Recommendations for Other Services OT consult     Precautions / Restrictions Precautions Precautions: Fall Precaution Comments: chronic full thickness  wound L chest Restrictions Weight Bearing Restrictions: No    Mobility  Bed Mobility               General bed mobility comments: pt up in recliner at start of session  Transfers Overall transfer level: Needs assistance Equipment used: Rolling walker (2 wheeled) Transfers: Sit to/from Stand Sit to Stand: Min guard         General transfer comment: Pt needed to gain momentum to complete transfer, heavy use of chair arms, places AD outside BOS. x2 attempted  Ambulation/Gait Ambulation/Gait assistance: Min guard Gait Distance (Feet): 180 Feet Assistive device: Rolling walker (2 wheeled)       General Gait Details: Pt ambulated with RW and CGA, [place] at 2L in place with consent from nursing. Needed several standing rest breaks due to spO2 desat, recovered >92% prior to continuation of ambulation. Pt exhibited fatigue with increasing leg shaking and decrease in motor control of RLE (foot slap increase, wide BOS).    Stairs             Wheelchair Mobility    Modified Rankin (Stroke Patients Only)       Balance Overall balance assessment: Needs assistance Sitting-balance support: No upper extremity supported;Feet supported Sitting balance-Leahy Scale: Normal       Standing balance-Leahy Scale: Fair                              Cognition Arousal/Alertness: Awake/alert Behavior During Therapy: WFL for tasks assessed/performed Overall Cognitive Status: Within Functional Limits for tasks assessed  Exercises Other Exercises Other Exercises: Pt performed seated LAQ, marching, heel/toe raises AROM x20 ea side, and sit <> stand transfer with static standing ~30secs. Pt SOB and desatting on room air with exertional activity.     General Comments        Pertinent Vitals/Pain Pain Assessment: No/denies pain    Home Living                      Prior Function            PT Goals  (current goals can now be found in the care plan section) Progress towards PT goals: Progressing toward goals    Frequency    Min 2X/week      PT Plan Current plan remains appropriate    Co-evaluation              AM-PAC PT "6 Clicks" Mobility   Outcome Measure  Help needed turning from your back to your side while in a flat bed without using bedrails?: A Little Help needed moving from lying on your back to sitting on the side of a flat bed without using bedrails?: A Little Help needed moving to and from a bed to a chair (including a wheelchair)?: A Little Help needed standing up from a chair using your arms (e.g., wheelchair or bedside chair)?: A Little Help needed to walk in hospital room?: A Little Help needed climbing 3-5 steps with a railing? : A Lot 6 Click Score: 17    End of Session Equipment Utilized During Treatment: Gait belt Activity Tolerance: Patient limited by fatigue Patient left: with chair alarm set;in chair;Other (comment)(heels elevated) Nurse Communication: Mobility status;Precautions PT Visit Diagnosis: Other abnormalities of gait and mobility (R26.89);Muscle weakness (generalized) (M62.81);Difficulty in walking, not elsewhere classified (R26.2)     Time: 1322-1400 PT Time Calculation (min) (ACUTE ONLY): 38 min  Charges:  $Therapeutic Exercise: 8-22 mins $Therapeutic Activity: 23-37 mins                     Lieutenant Diego PT, DPT 3:09 PM,05/31/18 [phone]

## 2018-05-31 NOTE — Care Management Important Message (Signed)
Copy of signed IM left with patient in room.  

## 2018-05-31 NOTE — Progress Notes (Signed)
[facility] NAME: Dontell Mian    MR#:  [id number]  DATE OF BIRTH:  Dec 21, 1955  SUBJECTIVE:  CHIEF COMPLAINT:   Chief Complaint  Patient presents with  . Shortness of Breath  Patient waiting discharge now awaiting insurance authorization  Patient awaiting passer for discharge REVIEW OF SYSTEMS:  Review of Systems  Constitutional: Negative for chills, fever and weight loss.  HENT: Negative for nosebleeds and sore throat.   Eyes: Negative for blurred vision.  Respiratory: Negative for cough, shortness of breath and wheezing.   Cardiovascular: Positive for leg swelling. Negative for chest pain, orthopnea and PND.  Gastrointestinal: Negative for abdominal pain, constipation, diarrhea, heartburn, nausea and vomiting.  Genitourinary: Negative for dysuria and urgency.  Musculoskeletal: Negative for back pain.  Skin: Negative for rash.  Neurological: Negative for dizziness, speech change, focal weakness and headaches.  Endo/Heme/Allergies: Does not bruise/bleed easily.  Psychiatric/Behavioral: Negative for depression.    DRUG ALLERGIES:   Allergies  Allergen Reactions  . Contrast Media [Iodinated Diagnostic Agents] Shortness Of Breath    Hypoxia/hypotension  . Versed [Midazolam] Shortness Of Breath and Other (See Comments)    Hypotension   VITALS:  Blood pressure 132/82, pulse 93, temperature 98.2 F (36.8 C), temperature source Oral, resp. rate (!) 24, height 5\' 7"  (1.702 m), weight 119.1 kg, SpO2 100 %. PHYSICAL EXAMINATION:  Physical Exam HENT:     Head: Normocephalic and atraumatic.  Eyes:     Conjunctiva/sclera: Conjunctivae normal.     Pupils: Pupils are equal, round, and reactive to light.  Neck:     Musculoskeletal: Normal range of motion and neck supple.     Thyroid: No thyromegaly.     Trachea: No tracheal deviation.  Cardiovascular:     Rate and Rhythm: Normal rate and regular rhythm.     Heart sounds:  Normal heart sounds.  Pulmonary:     Effort: Pulmonary effort is normal. No respiratory distress.     Breath sounds: Decreased breath sounds present. No wheezing.  Chest:     Chest wall: No tenderness.  Abdominal:     General: Bowel sounds are normal. There is no distension.     Palpations: Abdomen is soft.     Tenderness: There is no abdominal tenderness.  Musculoskeletal: Normal range of motion.     Right lower leg: Edema present.     Left lower leg: Edema present.  Skin:    General: Skin is warm and dry.     Findings: No rash.     Comments: Has pressure ulcer on sacrum with dressing in place Has skin breakdown on both legs with some transparent dressing present and some small area of yellowish collection underneath the dressing both side.  Neurological:     Mental Status: He is alert and oriented to person, place, and time.     Cranial Nerves: No cranial nerve deficit.    LABORATORY PANEL:  Male CBC Recent Labs  Lab 05/27/18 0459  WBC 6.6  HGB 8.4*  HCT 29.5*  PLT 204   ------------------------------------------------------------------------------------------------------------------ Chemistries  Recent Labs  Lab 05/26/18 0543  NA 137  K 3.3*  CL 101  CO2 27  GLUCOSE 94  BUN 33*  CREATININE 1.13  CALCIUM 8.3*   RADIOLOGY:  No results found. ASSESSMENT AND PLAN:  2 y m with acute hypoxic resp failure  * Acute Hypoxic Resp failure - due to COPD/CHF exacerbation Clinically improving, resolved now on room  air  * hyperkalemiainitially but now hypokalemia [place], nephrologist is following Potassium now normal  * Bradycardia, Hypotension required temporary pacer and dopamine, Improved. No indication for permanent pacemaker per cardiology and signed off  * Acute on chronic systolic dysfunction congestive heart failure with pulmonary edema - continue aggressive Diuresis - Appreciate Cardio input, echo - EF 45-50% - renal func worse, stopped  lasix. -Recommending to continue Plavix to prevent in-stent thrombosis and Eliquis for stroke prevention Recommending to continue metoprolol tartrate at low-dose   * Ac renal failure-cardiorenal syndrome Stop lasix, lisinopril, ultrasound renal without obstruction. Nephrology is following, significantly improved Creatinine 2.09-1.7.-1.22-1.13 Avoid nephrotoxins   *Chronic sleep apnea Patient uses CPAP at night but not consistently and falls asleep during daytime as he feels tired for not being able to use the CPAP mask during nights  *Diabetes mellitus - insulin Lantus, continue sliding scale -Seen by diabetic coordinator -Stop metformin due to renal failure.  * Elevated troponin - likely due to demand ischemia, monitor on tele  * Sinus tach: continue Metoprolol  *Pressure ulcer - on sacrum with stage 2 pressure injury Per wound care nurse which I agree with  Measurement:.2X.2X.1cm Wound FIE:PPIR red Drainage (amount, consistency, odor)no odor or drainage Periwound:intact skin surrounding Wound care nurse recommendsFoam dressing to protect from further injury.  - left chest wound -Chronic full thickness wound with packing; mod amt green-tinged drainage, some odor.  .3X.3X3cm Surrounded by red macerated skin in the skin fold ,wet linear partial thickness fissure; .2X3X.1cm, red and moist, small amt tan drainage, no odor Wound care nurse recommends - narrow strip of Aquacel and packed with a swab to absorb drainage and provide antimicrobial benefits. Pt can resume previous plan of care with home health nursing after discharge. Seen and evaluated by general surgery Dr. Peyton Najjar no wound debridement needed at this time.  Continue wound care no need of antibiotics either per surgery  * Hypertension Hold lisinopril due ot renal failure   *Generalized weakness  Patient seen by physical therapy , recommending skilled nursing facility. Awaiting for  insurance authorization/ PASSAR no  for placement   All the records are reviewed and case discussed with Care Management/Social Worker. Management plans discussed with the patient, patient's wife over phone, nursing and they are in agreement.  CODE STATUS: Full Code  TOTAL TIME TAKING CARE OF THIS PATIENT: 28 minutes.  His wife is admitted in [facility] and need updates.  More than 50% of the time was spent in counseling/coordination of care: YES  POSSIBLE D/C IN 1-2 DAYS, DEPENDING ON CLINICAL CONDITION.   Dustin Flock M.D on 05/31/2018 at 2:57 PM  Between 7am to 6pm - Pager - [phone]  After 6pm go to [email or web address] - Proofreader  [employer]  Hospitalists  Office  [phone]  CC: Primary care physician; Idelle Crouch, MD  Note: This dictation was prepared with Dragon dictation along with smaller phrase technology. Any transcriptional errors that result from this process are unintentional.

## 2018-05-31 NOTE — Discharge Summary (Signed)
[place] [place] NAME: Marcus Williams    MR#:  [id number]  DATE OF BIRTH:  [birth year]  DATE OF ADMISSION:  05/19/2018 ADMITTING PHYSICIAN: Max Sane, MD  DATE OF DISCHARGE: 05/27/18  PRIMARY CARE PHYSICIAN: Idelle Crouch, MD    ADMISSION DIAGNOSIS:  Acute on chronic combined systolic and diastolic congestive heart failure (HCC) [I50.43] Acute on chronic systolic CHF (congestive heart failure) (HCC) [I50.23] Acute respiratory failure with hypoxemia (HCC) [J96.01]  DISCHARGE DIAGNOSIS:  Active Problems:   Acute respiratory failure with hypoxemia (HCC)   Pressure injury of skin Generalized weakness  SECONDARY DIAGNOSIS:   Past Medical History:  Diagnosis Date  . A-fib ([place])   . Aortic stenosis, moderate 11/2015  . Arthritis   . CAD (coronary artery disease)   . Chronic systolic CHF (congestive heart failure) ([place])   . COPD (chronic obstructive pulmonary disease) ([place])   . Diabetes mellitus without complication ([place])    INSULIN DEPENDENT  . GERD (gastroesophageal reflux disease)   . History of cardioversion   . Hypertension   . MI (myocardial infarction) ([place])   . OSA on CPAP   . Sepsis ([place])   . Shortness of breath dyspnea   . Stroke ([place])   . SVT (supraventricular tachycardia) [facility])     HOSPITAL COURSE:   * Acute Hypoxic Resp failure - due to COPD/CHF exacerbation Clinically improving, resolved now on room air  * hyperkalemia initially but now hypokalemia   [place], nephrologist is following Potassium at 3.3-replete, recheck  * Bradycardia, Hypotension  required temporary pacer and dopamine, Improved. No indication for permanent pacemaker per cardiology and signed off  * Acute on chronic systolic dysfunction congestive heart failure with pulmonary edema - continue aggressive Diuresis - Appreciate Cardio input, echo - EF 45-50% - renal func worse, stopped lasix. -Recommending to continue Plavix to  prevent in-stent thrombosis and Eliquis for stroke prevention Recommending to continue metoprolol tartrate at low-dose Patient qualified for 2 L of oxygen via nasal cannula  * Ac renal failure-cardiorenal syndrome   Stop lasix, lisinopril, ultrasound renal without obstruction. Nephrology is following, significantly improved Creatinine 2.09-1.7.-1.22-1.13 Avoid nephrotoxins   *Chronic sleep apnea Patient uses CPAP at night but not consistently and falls asleep during daytime as he feels tired for not being able to use the CPAP mask during nights  *Diabetes mellitus - insulin Lantus, continue sliding scale -Seen by diabetic coordinator -Stop metformin due to renal failure.  * Elevated troponin - likely due to demand ischemia, monitor on tele  * Sinus tach: continue Metoprolol  *Pressure ulcer - on sacrum with stage 2 pressure injury Per wound care nurse which I agree with  Measurement:.2X.2X.1cm Wound MGN:OIBB red Drainage (amount, consistency, odor)no odor or drainage Periwound:intact skin surrounding Wound care nurse recommendsFoam dressing to protect from further injury.  - left chest wound -Chronic full thickness wound with packing; mod amt green-tinged drainage, some odor.  .3X.3X3cm Surrounded by red macerated skin in the skin fold ,wet  linear partial thickness fissure; .2X3X.1cm, red and moist, small amt tan drainage, no odor Wound care nurse recommends - narrow strip of Aquacel and packed with a swab to absorb drainage and provide antimicrobial benefits. Pt can resume previous plan of care with home health nursing after discharge.  * Hypertension   Hold lisinopril due ot renal failure   *Generalized weakness    Patient seen by physical therapy , recommending skilled nursing facility.  Awaiting for insurance authorization  for placement   Disposition skilled nursing facility  after insurance authorization  DISCHARGE CONDITIONS:   FAIR  CONSULTS  OBTAINED:  Treatment Team:  Corey Skains, MD Anthonette Legato, MD Shelda Altes, MD   PROCEDURES   DRUG ALLERGIES:   Allergies  Allergen Reactions  . Contrast Media [Iodinated Diagnostic Agents] Shortness Of Breath    Hypoxia/hypotension  . Versed [Midazolam] Shortness Of Breath and Other (See Comments)    Hypotension    DISCHARGE MEDICATIONS:   Allergies as of 05/31/2018      Reactions   Contrast Media [iodinated Diagnostic Agents] Shortness Of Breath   Hypoxia/hypotension   Versed [midazolam] Shortness Of Breath, Other (See Comments)   Hypotension      Medication List    STOP taking these medications   baclofen 10 MG tablet Commonly known as:  LIORESAL   chlorpheniramine-HYDROcodone 10-8 MG/5ML Suer Commonly known as:  TUSSIONEX PENNKINETIC ER   diltiazem 180 MG 24 hr capsule Commonly known as:  CARDIZEM CD   insulin NPH-regular Human (70-30) 100 UNIT/ML injection   isosorbide mononitrate 30 MG 24 hr tablet Commonly known as:  IMDUR   lisinopril 10 MG tablet Commonly known as:  PRINIVIL,ZESTRIL   nitroGLYCERIN 0.4 MG SL tablet Commonly known as:  NITROSTAT     TAKE these medications   acetaminophen 325 MG tablet Commonly known as:  TYLENOL Take 2 tablets (650 mg total) by mouth every 6 (six) hours as needed for moderate pain (headache).   albuterol 108 (90 Base) MCG/ACT inhaler Commonly known as:  PROVENTIL HFA;VENTOLIN HFA Inhale 2 puffs into the lungs every 6 (six) hours as needed for wheezing or shortness of breath.   apixaban 5 MG Tabs tablet Commonly known as:  ELIQUIS Take 5 mg by mouth 2 (two) times daily.   atorvastatin 40 MG tablet Commonly known as:  LIPITOR Take 1 tablet (40 mg total) by mouth daily at 6 PM.   budesonide 0.5 MG/2ML nebulizer solution Commonly known as:  PULMICORT Take 2 mLs (0.5 mg total) by nebulization 2 (two) times daily.   clopidogrel 75 MG tablet Commonly known as:  PLAVIX Take 1 tablet (75 mg total)  by mouth daily with breakfast.   DULoxetine 20 MG capsule Commonly known as:  CYMBALTA Take 20 mg by mouth daily.   Fluticasone-Salmeterol 250-50 MCG/DOSE Aepb Commonly known as:  ADVAIR Inhale 1 puff into the lungs 2 (two) times daily.   ipratropium-albuterol 0.5-2.5 (3) MG/3ML Soln Commonly known as:  DUONEB Take 3 mLs by nebulization 2 (two) times daily.   metFORMIN 1000 MG tablet Commonly known as:  GLUCOPHAGE Take 1,000 mg by mouth 2 (two) times daily with a meal.   metoprolol tartrate 25 MG tablet Commonly known as:  LOPRESSOR Take 1 tablet (25 mg total) by mouth 2 (two) times daily. What changed:    medication strength  how much to take   oxyCODONE 5 MG immediate release tablet Commonly known as:  Oxy IR/ROXICODONE Take 1-2 tablets (5-10 mg total) by mouth every 6 (six) hours as needed for moderate pain, severe pain or breakthrough pain.   pantoprazole 40 MG tablet Commonly known as:  PROTONIX Take 1 tablet (40 mg total) by mouth 2 (two) times daily.   polyethylene glycol packet Commonly known as:  MIRALAX / GLYCOLAX Take 17 g by mouth daily.   Potassium Chloride ER 20 MEQ Tbcr Take 10 mEq by mouth daily. What changed:  how much to take   pramipexole 0.5  MG tablet Commonly known as:  MIRAPEX Take 0.5 mg by mouth 3 (three) times daily as needed.   senna 8.6 MG Tabs tablet Commonly known as:  SENOKOT Take 1 tablet (8.6 mg total) by mouth daily as needed for mild constipation.   sodium chloride 0.65 % Soln nasal spray Commonly known as:  OCEAN Place 1 spray into both nostrils as needed for congestion.   torsemide 20 MG tablet Commonly known as:  DEMADEX Take 1 tablet (20 mg total) by mouth daily. What changed:  how much to take   zolpidem 5 MG tablet Commonly known as:  AMBIEN Take 1 tablet (5 mg total) by mouth at bedtime as needed for sleep.            Durable Medical Equipment  (From admission, onward)         Start     Ordered    05/26/18 1052  For home use only DME Walker rolling  Once    Comments:  Rolling walker with 5 inch wheels  Question:  Patient needs a walker to treat with the following condition  Answer:  Weakness   05/26/18 1051   05/25/18 1425  For home use only DME oxygen  Once    Question Answer Comment  Mode or (Route) Nasal cannula   Liters per Minute 2   Frequency Continuous (stationary and portable oxygen unit needed)   Oxygen conserving device Yes   Oxygen delivery system Gas      05/25/18 1424           DISCHARGE INSTRUCTIONS:  Follow-up with primary care physician at the facility in 3 days Follow-up with cardiology in a week  DIET:  Cardiac diet and Diabetic diet  DISCHARGE CONDITION:  Fair  ACTIVITY:  Activity as tolerated  OXYGEN:  Home Oxygen: NO  Oxygen Delivery: room air  DISCHARGE LOCATION:  nursing home   If you experience worsening of your admission symptoms, develop shortness of breath, life threatening emergency, suicidal or homicidal thoughts you must seek medical attention immediately by calling 911 or calling your MD immediately  if symptoms less severe.  You Must read complete instructions/literature along with all the possible adverse reactions/side effects for all the Medicines you take and that have been prescribed to you. Take any new Medicines after you have completely understood and accpet all the possible adverse reactions/side effects.   Please note  You were cared for by a hospitalist during your hospital stay. If you have any questions about your discharge medications or the care you received while you were in the hospital after you are discharged, you can call the unit and asked to speak with the hospitalist on call if the hospitalist that took care of you is not available. Once you are discharged, your primary care physician will handle any further medical issues. Please note that NO REFILLS for any discharge medications will be authorized once you  are discharged, as it is imperative that you return to your primary care physician (or establish a relationship with a primary care physician if you do not have one) for your aftercare needs so that they can reassess your need for medications and monitor your lab values.     Today  Chief Complaint  Patient presents with  . Shortness of Breath   Patient is feeling much better.  Awaiting for insurance authorization to go to rehab center  ROS: CONSTITUTIONAL: Denies fevers, chills. Denies any fatigue, weakness.  EYES: Denies blurry vision, double  vision, eye pain. EARS, NOSE, THROAT: Denies tinnitus, ear pain, hearing loss. RESPIRATORY: Denies cough, wheeze, shortness of breath.  CARDIOVASCULAR: Denies chest pain, palpitations, edema.  GASTROINTESTINAL: Denies nausea, vomiting, diarrhea, abdominal pain. Denies bright red blood per rectum. GENITOURINARY: Denies dysuria, hematuria. ENDOCRINE: Denies nocturia or thyroid problems. HEMATOLOGIC AND LYMPHATIC: Denies easy bruising or bleeding. SKIN: Denies rash or lesion. MUSCULOSKELETAL: Denies pain in neck, back, shoulder, knees, hips or arthritic symptoms.  NEUROLOGIC: Denies paralysis, paresthesias.  PSYCHIATRIC: Denies anxiety or depressive symptoms.   VITAL SIGNS:  Blood pressure 132/82, pulse 93, temperature 98.2 F (36.8 C), temperature source Oral, resp. rate (!) 24, height 5\' 7"  (1.702 m), weight 119.1 kg, SpO2 100 %.  I/O:    Intake/Output Summary (Last 24 hours) at 05/31/2018 0856 Last data filed at 05/30/2018 2311 Gross per 24 hour  Intake -  Output 1125 ml  Net -1125 ml    PHYSICAL EXAMINATION:  GENERAL:  62 y.o.-year-old patient lying in the bed with no acute distress.  EYES: Pupils equal, round, reactive to light and accommodation. No scleral icterus. Extraocular muscles intact.  HEENT: Head atraumatic, normocephalic. Oropharynx and nasopharynx clear.  NECK:  Supple, no jugular venous distention. No thyroid  enlargement, no tenderness.  LUNGS: Normal breath sounds bilaterally, no wheezing, rales,rhonchi or crepitation. No use of accessory muscles of respiration.  CARDIOVASCULAR: S1, S2 normal. No murmurs, rubs, or gallops.  ABDOMEN: Soft, non-tender, non-distended. Bowel sounds present.   EXTREMITIES: No pedal edema, cyanosis, or clubbing.  NEUROLOGIC: Awake, alert and oriented x3 sensation intact. Gait not checked.  PSYCHIATRIC: The patient is alert and oriented x 3.  SKIN: No obvious rash, lesion, or ulcer.   DATA REVIEW:   CBC Recent Labs  Lab 05/27/18 0459  WBC 6.6  HGB 8.4*  HCT 29.5*  PLT 204    Chemistries  Recent Labs  Lab 05/26/18 0543  NA 137  K 3.3*  CL 101  CO2 27  GLUCOSE 94  BUN 33*  CREATININE 1.13  CALCIUM 8.3*    Cardiac Enzymes No results for input(s): TROPONINI in the last 168 hours.  Microbiology Results  Results for orders placed or performed during the hospital encounter of 05/19/18  MRSA PCR Screening     Status: None   Collection Time: 05/22/18  8:19 PM  Result Value Ref Range Status   MRSA by PCR NEGATIVE NEGATIVE Final    Comment:        The GeneXpert MRSA Assay (FDA approved for NASAL specimens only), is one component of a comprehensive MRSA colonization surveillance program. It is not intended to diagnose MRSA infection nor to guide or monitor treatment for MRSA infections. Performed at [facility], [address] [place], [place] [address]   CULTURE, BLOOD (ROUTINE X 2) w Reflex to ID Panel     Status: None   Collection Time: 05/23/18  6:09 AM  Result Value Ref Range Status   Specimen Description BLOOD LEFT HAND  Final   Special Requests   Final    BOTTLES DRAWN AEROBIC ONLY Blood Culture adequate volume   Culture   Final    NO GROWTH 5 DAYS Performed at [facility], [address] [place], [place] [address]    Report Status 05/28/2018 FINAL  Final  CULTURE, BLOOD (ROUTINE X 2) w Reflex to ID Panel      Status: None   Collection Time: 05/23/18  8:27 AM  Result Value Ref Range Status   Specimen Description BLOOD  LAC  Final   Special Requests   Final    BOTTLES DRAWN AEROBIC AND ANAEROBIC Blood Culture adequate volume   Culture   Final    NO GROWTH 5 DAYS Performed at [facility], [address] [place],  [address]    Report Status 05/28/2018 FINAL  Final    RADIOLOGY:  No results found.  EKG:   Orders placed or performed during the hospital encounter of 05/19/18  . ED EKG  . ED EKG  . EKG 12-Lead  . EKG 12-Lead      Management plans discussed with the patient, family and they are in agreement.  CODE STATUS:     Code Status Orders  (From admission, onward)         Start     Ordered   05/22/18 2212  Full code  Continuous     05/22/18 2212        Code Status History    Date Active Date Inactive Code Status Order ID Comments User Context   12/03/2017 2125 12/05/2017 1529 Full Code [id number]  Hillary Bow, MD ED   11/29/2017 1422 11/30/2017 1853 Full Code [id number]  [name], MD ED   10/28/2017 1958 11/01/2017 1658 Full Code [id number]  Salary, Avel Peace, MD ED   10/22/2017 0551 10/24/2017 1801 Full Code [id number]  Arta Silence, MD ED   08/10/2017 2205 08/14/2017 1512 Full Code [id number]  Vaughan Basta, MD Inpatient   07/31/2017 2045 08/02/2017 1648 Full Code [id number]  Henreitta Leber, MD Inpatient   07/28/2017 2230 07/31/2017 1428 Full Code [id number]  Dustin Flock, MD Inpatient   03/01/2017 0203 03/01/2017 2004 Full Code [id number]  Hugelmeyer, Alexis, DO Inpatient   11/21/2015 1620 11/23/2015 1710 Full Code [id number]  Sherren Mocha, MD Inpatient   05/06/2015 1545 104/12/202016 1609 Full Code [id number]  Sharlotte Alamo, MD Inpatient   05/04/2015 1951 05/06/2015 1545 Full Code [id number]  Vaughan Basta, MD ED   01/14/2015 1416 01/15/2015 1514 Full Code [id number]  Lance Coon, MD Inpatient   12/22/2014 1825 12/23/2014 1612 Full Code [id number]   Epifanio Lesches, MD ED   10/14/2014 1214 10/15/2014 1350 Full Code [id number]  Alfonso Patten, RN Inpatient    Advance Directive Documentation     Most Recent Value  Type of Advance Directive  Healthcare Power of Attorney  Pre-existing out of facility DNR order (yellow form or pink MOST form)  -  "MOST" Form in Place?  -      TOTAL TIME TAKING CARE OF THIS PATIENT: 42 minutes.   Note: This dictation was prepared with Dragon dictation along with smaller phrase technology. Any transcriptional errors that result from this process are unintentional.   @[facility] @  on 05/31/2018 at 8:56 AM  Between 7am to 6pm - Pager - [phone]  After 6pm go to [email or web address] - password [email or web address] [facility]  Office  [phone]  CC: Primary care physician; Idelle Crouch, MD

## 2018-06-01 ENCOUNTER — Ambulatory Visit: Payer: Medicare HMO | Admitting: Family

## 2018-06-01 DIAGNOSIS — F418 Other specified anxiety disorders: Secondary | ICD-10-CM | POA: Diagnosis not present

## 2018-06-01 DIAGNOSIS — G4733 Obstructive sleep apnea (adult) (pediatric): Secondary | ICD-10-CM | POA: Diagnosis not present

## 2018-06-01 DIAGNOSIS — I5033 Acute on chronic diastolic (congestive) heart failure: Secondary | ICD-10-CM | POA: Diagnosis not present

## 2018-06-01 DIAGNOSIS — I63541 Cerebral infarction due to unspecified occlusion or stenosis of right cerebellar artery: Secondary | ICD-10-CM | POA: Diagnosis not present

## 2018-06-01 DIAGNOSIS — I509 Heart failure, unspecified: Secondary | ICD-10-CM | POA: Diagnosis not present

## 2018-06-01 DIAGNOSIS — J449 Chronic obstructive pulmonary disease, unspecified: Secondary | ICD-10-CM | POA: Diagnosis not present

## 2018-06-01 DIAGNOSIS — R7989 Other specified abnormal findings of blood chemistry: Secondary | ICD-10-CM | POA: Diagnosis not present

## 2018-06-01 DIAGNOSIS — I2510 Atherosclerotic heart disease of native coronary artery without angina pectoris: Secondary | ICD-10-CM | POA: Diagnosis not present

## 2018-06-01 DIAGNOSIS — E1140 Type 2 diabetes mellitus with diabetic neuropathy, unspecified: Secondary | ICD-10-CM | POA: Diagnosis not present

## 2018-06-01 DIAGNOSIS — Z9989 Dependence on other enabling machines and devices: Secondary | ICD-10-CM | POA: Diagnosis not present

## 2018-06-01 DIAGNOSIS — J9600 Acute respiratory failure, unspecified whether with hypoxia or hypercapnia: Secondary | ICD-10-CM | POA: Diagnosis not present

## 2018-06-01 DIAGNOSIS — I5043 Acute on chronic combined systolic (congestive) and diastolic (congestive) heart failure: Secondary | ICD-10-CM | POA: Diagnosis not present

## 2018-06-01 DIAGNOSIS — I480 Paroxysmal atrial fibrillation: Secondary | ICD-10-CM | POA: Diagnosis not present

## 2018-06-01 DIAGNOSIS — E1165 Type 2 diabetes mellitus with hyperglycemia: Secondary | ICD-10-CM | POA: Diagnosis not present

## 2018-06-01 DIAGNOSIS — Q231 Congenital insufficiency of aortic valve: Secondary | ICD-10-CM | POA: Diagnosis not present

## 2018-06-01 DIAGNOSIS — I350 Nonrheumatic aortic (valve) stenosis: Secondary | ICD-10-CM | POA: Diagnosis not present

## 2018-06-01 DIAGNOSIS — I10 Essential (primary) hypertension: Secondary | ICD-10-CM | POA: Diagnosis not present

## 2018-06-01 DIAGNOSIS — M6281 Muscle weakness (generalized): Secondary | ICD-10-CM | POA: Diagnosis not present

## 2018-06-01 DIAGNOSIS — J9601 Acute respiratory failure with hypoxia: Secondary | ICD-10-CM | POA: Diagnosis not present

## 2018-06-01 DIAGNOSIS — I4891 Unspecified atrial fibrillation: Secondary | ICD-10-CM | POA: Diagnosis not present

## 2018-06-01 DIAGNOSIS — E1159 Type 2 diabetes mellitus with other circulatory complications: Secondary | ICD-10-CM | POA: Diagnosis not present

## 2018-06-01 LAB — GLUCOSE, CAPILLARY
Glucose-Capillary: 107 mg/dL — ABNORMAL HIGH (ref 70–99)
Glucose-Capillary: 206 mg/dL — ABNORMAL HIGH (ref 70–99)

## 2018-06-01 MED ORDER — OXYCODONE HCL 5 MG PO TABS: 5.0000 mg | ORAL_TABLET | Freq: Four times a day (QID) | ORAL | 0 refills | Status: AC | PRN

## 2018-06-01 NOTE — Clinical Social Work Note (Signed)
Patient to be d/c'ed today to [facility] room 703.  Patient and family agreeable to plans will transport via ems RN to call report to [facility] hall nurse, [phone].  Patient has been updated and will update his wife.  Evette Cristal, MSW, [place]

## 2018-06-01 NOTE — Plan of Care (Signed)
Patient is attempting to increase activity, up to while awake and ambulating around the room. Patient requires a walker and standby assistance.

## 2018-06-01 NOTE — Progress Notes (Signed)
Report called to Megan at [employer].

## 2018-06-01 NOTE — Clinical Social Work Note (Signed)
CSW was informed that patient has been approved to return to [facility] to continue with his therapy.  Mule Broom. Gaines, MSW, [place]  06/01/2018 12:17 PM

## 2018-06-01 NOTE — Discharge Summary (Addendum)
[facility] NAME: Marcus Williams    MR#:  [id number]  DATE OF BIRTH:  [birth year]  DATE OF ADMISSION:  05/19/2018 ADMITTING PHYSICIAN: Max Sane, MD  DATE OF DISCHARGE: 06/01/18  PRIMARY CARE PHYSICIAN: Idelle Crouch, MD    ADMISSION DIAGNOSIS:  Acute on chronic combined systolic and diastolic congestive heart failure (HCC) [I50.43] Acute on chronic systolic CHF (congestive heart failure) (HCC) [I50.23] Acute respiratory failure with hypoxemia (HCC) [J96.01]  DISCHARGE DIAGNOSIS:  Active Problems:   Acute respiratory failure with hypoxemia (HCC)   Pressure injury of skin Generalized weakness  SECONDARY DIAGNOSIS:   Past Medical History:  Diagnosis Date  . A-fib ([place])   . Aortic stenosis, moderate 11/2015  . Arthritis   . CAD (coronary artery disease)   . Chronic systolic CHF (congestive heart failure) ([place])   . COPD (chronic obstructive pulmonary disease) ([place])   . Diabetes mellitus without complication ([place])    INSULIN DEPENDENT  . GERD (gastroesophageal reflux disease)   . History of cardioversion   . Hypertension   . MI (myocardial infarction) ([place])   . OSA on CPAP   . Sepsis ([place])   . Shortness of breath dyspnea   . Stroke ([place])   . SVT (supraventricular tachycardia) [facility])     HOSPITAL COURSE:   * Acute Hypoxic Resp failure - due to COPD/CHF exacerbation Clinically improving, resolved now on room air  * hyperkalemia initially but now hypokalemia   [place], nephrologist is following Potassium at 3.3-replete, recheck  * Bradycardia, Hypotension  required temporary pacer and dopamine, Improved. No indication for permanent pacemaker per cardiology and signed off  * Acute on chronic systolic dysfunction congestive heart failure with pulmonary edema - continue aggressive Diuresis - Appreciate Cardio input, echo - EF 45-50% - renal func worse, stopped lasix. -Recommending to continue Plavix to  prevent in-stent thrombosis and Eliquis for stroke prevention Recommending to continue metoprolol tartrate at low-dose Patient qualified for 2 L of oxygen via nasal cannula  * Ac renal failure-cardiorenal syndrome   Stop lasix, lisinopril, ultrasound renal without obstruction. Nephrology is following, significantly improved Creatinine 2.09-1.7.-1.22-1.13 Avoid nephrotoxins   *Chronic sleep apnea Patient uses CPAP at night but not consistently and falls asleep during daytime as he feels tired for not being able to use the CPAP mask during nights  *Diabetes mellitus -Resume metformin on discharge , patient's renal function has improved if needed blood sugars are high at the facility he can be started on insulin  * Elevated troponin - likely due to demand ischemia, monitor on tele  * Sinus tach: continue Metoprolol  *Pressure ulcer - on sacrum with stage 2 pressure injury Per wound care nurse which I agree with  Measurement:.2X.2X.1cm Wound ERD:EYCX red Drainage (amount, consistency, odor)no odor or drainage Periwound:intact skin surrounding Wound care nurse recommendsFoam dressing to protect from further injury.  - left chest wound -Chronic full thickness wound with packing; mod amt green-tinged drainage, some odor.  .3X.3X3cm Surrounded by red macerated skin in the skin fold ,wet  linear partial thickness fissure; .2X3X.1cm, red and moist, small amt tan drainage, no odor Wound care nurse recommends - narrow strip of Aquacel and packed with a swab to absorb drainage and provide antimicrobial benefits. Pt can resume previous plan of care with home health nursing after discharge.  * Hypertension   Hold lisinopril due ot renal failure   *Generalized weakness    Patient seen by physical therapy ,  recommending skilled nursing facility.  Awaiting for insurance authorization for placement   Disposition skilled nursing facility  after insurance  authorization  DISCHARGE CONDITIONS:   FAIR  CONSULTS OBTAINED:  Treatment Team:  Corey Skains, MD Anthonette Legato, MD Shelda Altes, MD   PROCEDURES   DRUG ALLERGIES:   Allergies  Allergen Reactions  . Contrast Media [Iodinated Diagnostic Agents] Shortness Of Breath    Hypoxia/hypotension  . Versed [Midazolam] Shortness Of Breath and Other (See Comments)    Hypotension    DISCHARGE MEDICATIONS:   Allergies as of 06/01/2018      Reactions   Contrast Media [iodinated Diagnostic Agents] Shortness Of Breath   Hypoxia/hypotension   Versed [midazolam] Shortness Of Breath, Other (See Comments)   Hypotension      Medication List    STOP taking these medications   baclofen 10 MG tablet Commonly known as:  LIORESAL   chlorpheniramine-HYDROcodone 10-8 MG/5ML Suer Commonly known as:  TUSSIONEX PENNKINETIC ER   diltiazem 180 MG 24 hr capsule Commonly known as:  CARDIZEM CD   insulin NPH-regular Human (70-30) 100 UNIT/ML injection   isosorbide mononitrate 30 MG 24 hr tablet Commonly known as:  IMDUR   lisinopril 10 MG tablet Commonly known as:  PRINIVIL,ZESTRIL   nitroGLYCERIN 0.4 MG SL tablet Commonly known as:  NITROSTAT     TAKE these medications   acetaminophen 325 MG tablet Commonly known as:  TYLENOL Take 2 tablets (650 mg total) by mouth every 6 (six) hours as needed for moderate pain (headache).   albuterol 108 (90 Base) MCG/ACT inhaler Commonly known as:  PROVENTIL HFA;VENTOLIN HFA Inhale 2 puffs into the lungs every 6 (six) hours as needed for wheezing or shortness of breath.   apixaban 5 MG Tabs tablet Commonly known as:  ELIQUIS Take 5 mg by mouth 2 (two) times daily.   atorvastatin 40 MG tablet Commonly known as:  LIPITOR Take 1 tablet (40 mg total) by mouth daily at 6 PM.   budesonide 0.5 MG/2ML nebulizer solution Commonly known as:  PULMICORT Take 2 mLs (0.5 mg total) by nebulization 2 (two) times daily.   clopidogrel 75 MG  tablet Commonly known as:  PLAVIX Take 1 tablet (75 mg total) by mouth daily with breakfast.   DULoxetine 20 MG capsule Commonly known as:  CYMBALTA Take 20 mg by mouth daily.   Fluticasone-Salmeterol 250-50 MCG/DOSE Aepb Commonly known as:  ADVAIR Inhale 1 puff into the lungs 2 (two) times daily.   ipratropium-albuterol 0.5-2.5 (3) MG/3ML Soln Commonly known as:  DUONEB Take 3 mLs by nebulization 2 (two) times daily.   metFORMIN 1000 MG tablet Commonly known as:  GLUCOPHAGE Take 1,000 mg by mouth 2 (two) times daily with a meal.   metoprolol tartrate 25 MG tablet Commonly known as:  LOPRESSOR Take 1 tablet (25 mg total) by mouth 2 (two) times daily. What changed:    medication strength  how much to take   oxyCODONE 5 MG immediate release tablet Commonly known as:  Oxy IR/ROXICODONE Take 1-2 tablets (5-10 mg total) by mouth every 6 (six) hours as needed for moderate pain, severe pain or breakthrough pain.   pantoprazole 40 MG tablet Commonly known as:  PROTONIX Take 1 tablet (40 mg total) by mouth 2 (two) times daily.   polyethylene glycol packet Commonly known as:  MIRALAX / GLYCOLAX Take 17 g by mouth daily.   Potassium Chloride ER 20 MEQ Tbcr Take 10 mEq by mouth daily. What changed:  how much to take   pramipexole 0.5 MG tablet Commonly known as:  MIRAPEX Take 0.5 mg by mouth 3 (three) times daily as needed.   senna 8.6 MG Tabs tablet Commonly known as:  SENOKOT Take 1 tablet (8.6 mg total) by mouth daily as needed for mild constipation.   sodium chloride 0.65 % Soln nasal spray Commonly known as:  OCEAN Place 1 spray into both nostrils as needed for congestion.   torsemide 20 MG tablet Commonly known as:  DEMADEX Take 1 tablet (20 mg total) by mouth daily. What changed:  how much to take   zolpidem 5 MG tablet Commonly known as:  AMBIEN Take 1 tablet (5 mg total) by mouth at bedtime as needed for sleep.            Durable Medical Equipment   (From admission, onward)         Start     Ordered   05/26/18 1052  For home use only DME Walker rolling  Once    Comments:  Rolling walker with 5 inch wheels  Question:  Patient needs a walker to treat with the following condition  Answer:  Weakness   05/26/18 1051   05/25/18 1425  For home use only DME oxygen  Once    Question Answer Comment  Mode or (Route) Nasal cannula   Liters per Minute 2   Frequency Continuous (stationary and portable oxygen unit needed)   Oxygen conserving device Yes   Oxygen delivery system Gas      05/25/18 1424           DISCHARGE INSTRUCTIONS:  Follow-up with primary care physician at the facility in 3 days Follow-up with cardiology in a week  DIET:  Cardiac diet and Diabetic diet  DISCHARGE CONDITION:  Fair  ACTIVITY:  Activity as tolerated  OXYGEN:  Home Oxygen: NO  Oxygen Delivery: room air  DISCHARGE LOCATION:  nursing home   If you experience worsening of your admission symptoms, develop shortness of breath, life threatening emergency, suicidal or homicidal thoughts you must seek medical attention immediately by calling 911 or calling your MD immediately  if symptoms less severe.  You Must read complete instructions/literature along with all the possible adverse reactions/side effects for all the Medicines you take and that have been prescribed to you. Take any new Medicines after you have completely understood and accpet all the possible adverse reactions/side effects.   Please note  You were cared for by a hospitalist during your hospital stay. If you have any questions about your discharge medications or the care you received while you were in the hospital after you are discharged, you can call the unit and asked to speak with the hospitalist on call if the hospitalist that took care of you is not available. Once you are discharged, your primary care physician will handle any further medical issues. Please note that NO REFILLS  for any discharge medications will be authorized once you are discharged, as it is imperative that you return to your primary care physician (or establish a relationship with a primary care physician if you do not have one) for your aftercare needs so that they can reassess your need for medications and monitor your lab values.     Today  Chief Complaint  Patient presents with  . Shortness of Breath   Patient is feeling much better.  Awaiting for insurance authorization to go to rehab center  ROS: CONSTITUTIONAL: Denies fevers, chills. Denies any  fatigue, weakness.  EYES: Denies blurry vision, double vision, eye pain. EARS, NOSE, THROAT: Denies tinnitus, ear pain, hearing loss. RESPIRATORY: Denies cough, wheeze, shortness of breath.  CARDIOVASCULAR: Denies chest pain, palpitations, edema.  GASTROINTESTINAL: Denies nausea, vomiting, diarrhea, abdominal pain. Denies bright red blood per rectum. GENITOURINARY: Denies dysuria, hematuria. ENDOCRINE: Denies nocturia or thyroid problems. HEMATOLOGIC AND LYMPHATIC: Denies easy bruising or bleeding. SKIN: Denies rash or lesion. MUSCULOSKELETAL: Denies pain in neck, back, shoulder, knees, hips or arthritic symptoms.  NEUROLOGIC: Denies paralysis, paresthesias.  PSYCHIATRIC: Denies anxiety or depressive symptoms.   VITAL SIGNS:  Blood pressure 119/75, pulse 100, temperature (!) 97.3 F (36.3 C), temperature source Oral, resp. rate 16, height 5\' 7"  (1.702 m), weight 118.2 kg, SpO2 100 %.  I/O:    Intake/Output Summary (Last 24 hours) at 06/01/2018 1229 Last data filed at 06/01/2018 1010 Gross per 24 hour  Intake 840 ml  Output 700 ml  Net 140 ml    PHYSICAL EXAMINATION:  GENERAL:  62 y.o.-year-old patient lying in the bed with no acute distress.  EYES: Pupils equal, round, reactive to light and accommodation. No scleral icterus. Extraocular muscles intact.  HEENT: Head atraumatic, normocephalic. Oropharynx and nasopharynx clear.   NECK:  Supple, no jugular venous distention. No thyroid enlargement, no tenderness.  LUNGS: Normal breath sounds bilaterally, no wheezing, rales,rhonchi or crepitation. No use of accessory muscles of respiration.  CARDIOVASCULAR: S1, S2 normal. No murmurs, rubs, or gallops.  ABDOMEN: Soft, non-tender, non-distended. Bowel sounds present.   EXTREMITIES: No pedal edema, cyanosis, or clubbing.  NEUROLOGIC: Awake, alert and oriented x3 sensation intact. Gait not checked.  PSYCHIATRIC: The patient is alert and oriented x 3.  SKIN: No obvious rash, lesion, or ulcer.   DATA REVIEW:   CBC Recent Labs  Lab 05/27/18 0459  WBC 6.6  HGB 8.4*  HCT 29.5*  PLT 204    Chemistries  Recent Labs  Lab 05/26/18 0543  NA 137  K 3.3*  CL 101  CO2 27  GLUCOSE 94  BUN 33*  CREATININE 1.13  CALCIUM 8.3*    Cardiac Enzymes No results for input(s): TROPONINI in the last 168 hours.  Microbiology Results  Results for orders placed or performed during the hospital encounter of 05/19/18  MRSA PCR Screening     Status: None   Collection Time: 05/22/18  8:19 PM  Result Value Ref Range Status   MRSA by PCR NEGATIVE NEGATIVE Final    Comment:        The GeneXpert MRSA Assay (FDA approved for NASAL specimens only), is one component of a comprehensive MRSA colonization surveillance program. It is not intended to diagnose MRSA infection nor to guide or monitor treatment for MRSA infections. Performed at [facility], [address] [place], [place] [address]   CULTURE, BLOOD (ROUTINE X 2) w Reflex to ID Panel     Status: None   Collection Time: 05/23/18  6:09 AM  Result Value Ref Range Status   Specimen Description BLOOD LEFT HAND  Final   Special Requests   Final    BOTTLES DRAWN AEROBIC ONLY Blood Culture adequate volume   Culture   Final    NO GROWTH 5 DAYS Performed at [facility], [address] [place], [place] [address]    Report Status 05/28/2018 FINAL   Final  CULTURE, BLOOD (ROUTINE X 2) w Reflex to ID Panel     Status: None   Collection Time: 05/23/18  8:27 AM  Result  Value Ref Range Status   Specimen Description BLOOD LAC  Final   Special Requests   Final    BOTTLES DRAWN AEROBIC AND ANAEROBIC Blood Culture adequate volume   Culture   Final    NO GROWTH 5 DAYS Performed at [facility], [address] [place], [place] [address]    Report Status 05/28/2018 FINAL  Final    RADIOLOGY:  No results found.  EKG:   Orders placed or performed during the hospital encounter of 05/19/18  . ED EKG  . ED EKG  . EKG 12-Lead  . EKG 12-Lead      Management plans discussed with the patient, family and they are in agreement.  CODE STATUS:     Code Status Orders  (From admission, onward)         Start     Ordered   05/22/18 2212  Full code  Continuous     05/22/18 2212        Code Status History    Date Active Date Inactive Code Status Order ID Comments User Context   12/03/2017 2125 12/05/2017 1529 Full Code [id number]  Hillary Bow, MD ED   11/29/2017 1422 11/30/2017 1853 Full Code [id number]  [name], MD ED   10/28/2017 1958 11/01/2017 1658 Full Code [id number]  Salary, Avel Peace, MD ED   10/22/2017 0551 10/24/2017 1801 Full Code [id number]  Arta Silence, MD ED   08/10/2017 2205 08/14/2017 1512 Full Code [id number]  Vaughan Basta, MD Inpatient   07/31/2017 2045 08/02/2017 1648 Full Code [id number]  Henreitta Leber, MD Inpatient   07/28/2017 2230 07/31/2017 1428 Full Code [id number]  Dustin Flock, MD Inpatient   03/01/2017 0203 03/01/2017 2004 Full Code [id number]  Hugelmeyer, Alexis, DO Inpatient   11/21/2015 1620 11/23/2015 1710 Full Code [id number]  Sherren Mocha, MD Inpatient   05/06/2015 1545 126-Apr-202016 1609 Full Code [id number]  Sharlotte Alamo, MD Inpatient   05/04/2015 1951 05/06/2015 1545 Full Code [id number]  Vaughan Basta, MD ED   01/14/2015 1416 01/15/2015 1514 Full Code [id number]  Lance Coon, MD  Inpatient   12/22/2014 1825 12/23/2014 1612 Full Code [id number]  Epifanio Lesches, MD ED   10/14/2014 1214 10/15/2014 1350 Full Code [id number]  Alfonso Patten, RN Inpatient    Advance Directive Documentation     Most Recent Value  Type of Advance Directive  Healthcare Power of Attorney  Pre-existing out of facility DNR order (yellow form or pink MOST form)  -  "MOST" Form in Place?  -      TOTAL TIME TAKING CARE OF THIS PATIENT: 42 minutes.   Note: This dictation was prepared with Dragon dictation along with smaller phrase technology. Any transcriptional errors that result from this process are unintentional.   @[facility] @  on 06/01/2018 at 12:29 PM  Between 7am to 6pm - Pager - [phone]  After 6pm go to [email or web address] - password [email or web address] [facility]  Office  [phone]  CC: Primary care physician; Idelle Crouch, MD

## 2018-06-01 NOTE — Progress Notes (Signed)
Discharged to [facility] via EMS.  Report called to facility.  AVS included in discharge packet.

## 2018-06-03 DIAGNOSIS — I509 Heart failure, unspecified: Secondary | ICD-10-CM | POA: Diagnosis not present

## 2018-06-03 DIAGNOSIS — E1159 Type 2 diabetes mellitus with other circulatory complications: Secondary | ICD-10-CM | POA: Diagnosis not present

## 2018-06-03 DIAGNOSIS — I4891 Unspecified atrial fibrillation: Secondary | ICD-10-CM | POA: Diagnosis not present

## 2018-06-03 DIAGNOSIS — I10 Essential (primary) hypertension: Secondary | ICD-10-CM | POA: Diagnosis not present

## 2018-06-03 DIAGNOSIS — J449 Chronic obstructive pulmonary disease, unspecified: Secondary | ICD-10-CM | POA: Diagnosis not present

## 2018-06-03 DIAGNOSIS — F418 Other specified anxiety disorders: Secondary | ICD-10-CM | POA: Diagnosis not present

## 2018-06-03 DIAGNOSIS — J9600 Acute respiratory failure, unspecified whether with hypoxia or hypercapnia: Secondary | ICD-10-CM | POA: Diagnosis not present

## 2018-06-03 DIAGNOSIS — I2510 Atherosclerotic heart disease of native coronary artery without angina pectoris: Secondary | ICD-10-CM | POA: Diagnosis not present

## 2018-06-10 DIAGNOSIS — G4733 Obstructive sleep apnea (adult) (pediatric): Secondary | ICD-10-CM | POA: Diagnosis not present

## 2018-06-10 DIAGNOSIS — I63541 Cerebral infarction due to unspecified occlusion or stenosis of right cerebellar artery: Secondary | ICD-10-CM | POA: Diagnosis not present

## 2018-06-10 DIAGNOSIS — I2510 Atherosclerotic heart disease of native coronary artery without angina pectoris: Secondary | ICD-10-CM | POA: Diagnosis not present

## 2018-06-10 DIAGNOSIS — Z9989 Dependence on other enabling machines and devices: Secondary | ICD-10-CM | POA: Diagnosis not present

## 2018-06-10 DIAGNOSIS — I350 Nonrheumatic aortic (valve) stenosis: Secondary | ICD-10-CM | POA: Diagnosis not present

## 2018-06-10 DIAGNOSIS — I10 Essential (primary) hypertension: Secondary | ICD-10-CM | POA: Diagnosis not present

## 2018-06-10 DIAGNOSIS — I480 Paroxysmal atrial fibrillation: Secondary | ICD-10-CM | POA: Diagnosis not present

## 2018-06-10 DIAGNOSIS — Q231 Congenital insufficiency of aortic valve: Secondary | ICD-10-CM | POA: Diagnosis not present

## 2018-06-13 DIAGNOSIS — I50814 Right heart failure due to left heart failure: Secondary | ICD-10-CM | POA: Diagnosis not present

## 2018-06-13 DIAGNOSIS — I480 Paroxysmal atrial fibrillation: Secondary | ICD-10-CM | POA: Diagnosis not present

## 2018-06-13 DIAGNOSIS — J9611 Chronic respiratory failure with hypoxia: Secondary | ICD-10-CM | POA: Diagnosis not present

## 2018-06-13 DIAGNOSIS — I110 Hypertensive heart disease with heart failure: Secondary | ICD-10-CM | POA: Diagnosis not present

## 2018-06-13 DIAGNOSIS — I2510 Atherosclerotic heart disease of native coronary artery without angina pectoris: Secondary | ICD-10-CM | POA: Diagnosis not present

## 2018-06-13 DIAGNOSIS — J449 Chronic obstructive pulmonary disease, unspecified: Secondary | ICD-10-CM | POA: Diagnosis not present

## 2018-06-13 DIAGNOSIS — T8131XD Disruption of external operation (surgical) wound, not elsewhere classified, subsequent encounter: Secondary | ICD-10-CM | POA: Diagnosis not present

## 2018-06-13 DIAGNOSIS — E1142 Type 2 diabetes mellitus with diabetic polyneuropathy: Secondary | ICD-10-CM | POA: Diagnosis not present

## 2018-06-13 DIAGNOSIS — I5042 Chronic combined systolic (congestive) and diastolic (congestive) heart failure: Secondary | ICD-10-CM | POA: Diagnosis not present

## 2018-06-14 ENCOUNTER — Other Ambulatory Visit: Payer: Self-pay

## 2018-06-14 NOTE — Patient Outreach (Signed)
[facility]) Care Management  06/14/2018  Marcus Williams [birth year] [id number]  Transition of care  Referral date:  06/14/18 Referral source: discharged from [facility] on 06/11/18 Insurance: [employer] [place] call to patient regarding transition of care referral. HIPAA verified with patient. Explained reason for call. Patient request RNCM speak with his wife, Marcus Williams regarding his health information.  RNCM contacted wife as requested. Explained reason for call.  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 She states patient sustained a fall broke a rib and developed fluid on his chest after his admission in August 2019. Wife states after fall and heart failure patient was admitted to the skilled nursing facility for therapy.  Wife states patient is currently receiving home health therapy and dressing changes to left side of chest  with [facility] home health.  Wife states patient is not weighing daily.  RNCM discussed importance of patient weighing daily and recording. Discussed signs/ symptoms of heart failure with wife. Advised wife that doctor should be call for heart failure symptoms.  Wife verbalized understanding. Wife denies patient having any swelling in legs/ ankle/feet. Wife states, " this is the best his feet/ ankle and legs have looked in a long time. "  Wife state patient has all of his medication and takes as directed. Wife states patient has a follow up appointment with his primary MD on tomorrow.  She states patient has transportation to appointments.  RNCM discussed and offered ongoing transition of care follow up call.  Wife declined stating patient has [facility] home health at this time therefore no other services needed at this time. RNCM offered to mail patient [facility] care management brochure/ magnet for future reference. Wife verbally agreed.  RNCM advised patient to notify MD of any changes in condition prior to scheduled  appointment. RNCM verified patient aware of 911 services for urgent/ emergent needs.  PLAN: RNCM will close patient due to refusal of services  RNCM will send patient [facility] care management brochure / magnet RNCM will send closure letter to patients primary MD   Quinn Plowman RN,BSN,CCM [facility] Telephonic  [phone]

## 2018-06-15 DIAGNOSIS — E7800 Pure hypercholesterolemia, unspecified: Secondary | ICD-10-CM | POA: Diagnosis not present

## 2018-06-15 DIAGNOSIS — I480 Paroxysmal atrial fibrillation: Secondary | ICD-10-CM | POA: Diagnosis not present

## 2018-06-15 DIAGNOSIS — I10 Essential (primary) hypertension: Secondary | ICD-10-CM | POA: Diagnosis not present

## 2018-06-15 DIAGNOSIS — R8290 Unspecified abnormal findings in urine: Secondary | ICD-10-CM | POA: Diagnosis not present

## 2018-06-15 DIAGNOSIS — Z79899 Other long term (current) drug therapy: Secondary | ICD-10-CM | POA: Diagnosis not present

## 2018-06-15 DIAGNOSIS — R0609 Other forms of dyspnea: Secondary | ICD-10-CM | POA: Diagnosis not present

## 2018-06-15 DIAGNOSIS — E1159 Type 2 diabetes mellitus with other circulatory complications: Secondary | ICD-10-CM | POA: Diagnosis not present

## 2018-06-15 DIAGNOSIS — E1143 Type 2 diabetes mellitus with diabetic autonomic (poly)neuropathy: Secondary | ICD-10-CM | POA: Diagnosis not present

## 2018-06-15 DIAGNOSIS — J431 Panlobular emphysema: Secondary | ICD-10-CM | POA: Diagnosis not present

## 2018-06-16 DIAGNOSIS — J101 Influenza due to other identified influenza virus with other respiratory manifestations: Secondary | ICD-10-CM

## 2018-06-16 HISTORY — DX: Influenza due to other identified influenza virus with other respiratory manifestations: J10.1

## 2018-06-17 DIAGNOSIS — E1142 Type 2 diabetes mellitus with diabetic polyneuropathy: Secondary | ICD-10-CM | POA: Diagnosis not present

## 2018-06-17 DIAGNOSIS — I2510 Atherosclerotic heart disease of native coronary artery without angina pectoris: Secondary | ICD-10-CM | POA: Diagnosis not present

## 2018-06-17 DIAGNOSIS — I480 Paroxysmal atrial fibrillation: Secondary | ICD-10-CM | POA: Diagnosis not present

## 2018-06-17 DIAGNOSIS — J449 Chronic obstructive pulmonary disease, unspecified: Secondary | ICD-10-CM | POA: Diagnosis not present

## 2018-06-17 DIAGNOSIS — I110 Hypertensive heart disease with heart failure: Secondary | ICD-10-CM | POA: Diagnosis not present

## 2018-06-17 DIAGNOSIS — T8131XD Disruption of external operation (surgical) wound, not elsewhere classified, subsequent encounter: Secondary | ICD-10-CM | POA: Diagnosis not present

## 2018-06-17 DIAGNOSIS — J9611 Chronic respiratory failure with hypoxia: Secondary | ICD-10-CM | POA: Diagnosis not present

## 2018-06-17 DIAGNOSIS — I50814 Right heart failure due to left heart failure: Secondary | ICD-10-CM | POA: Diagnosis not present

## 2018-06-17 DIAGNOSIS — I5042 Chronic combined systolic (congestive) and diastolic (congestive) heart failure: Secondary | ICD-10-CM | POA: Diagnosis not present

## 2018-06-18 ENCOUNTER — Ambulatory Visit: Payer: Medicare HMO | Admitting: Family

## 2018-06-18 DIAGNOSIS — J449 Chronic obstructive pulmonary disease, unspecified: Secondary | ICD-10-CM | POA: Diagnosis not present

## 2018-06-18 DIAGNOSIS — T8131XD Disruption of external operation (surgical) wound, not elsewhere classified, subsequent encounter: Secondary | ICD-10-CM | POA: Diagnosis not present

## 2018-06-18 DIAGNOSIS — I50814 Right heart failure due to left heart failure: Secondary | ICD-10-CM | POA: Diagnosis not present

## 2018-06-18 DIAGNOSIS — I5042 Chronic combined systolic (congestive) and diastolic (congestive) heart failure: Secondary | ICD-10-CM | POA: Diagnosis not present

## 2018-06-18 DIAGNOSIS — I110 Hypertensive heart disease with heart failure: Secondary | ICD-10-CM | POA: Diagnosis not present

## 2018-06-18 DIAGNOSIS — I480 Paroxysmal atrial fibrillation: Secondary | ICD-10-CM | POA: Diagnosis not present

## 2018-06-18 DIAGNOSIS — E1142 Type 2 diabetes mellitus with diabetic polyneuropathy: Secondary | ICD-10-CM | POA: Diagnosis not present

## 2018-06-18 DIAGNOSIS — I2510 Atherosclerotic heart disease of native coronary artery without angina pectoris: Secondary | ICD-10-CM | POA: Diagnosis not present

## 2018-06-18 DIAGNOSIS — J9611 Chronic respiratory failure with hypoxia: Secondary | ICD-10-CM | POA: Diagnosis not present

## 2018-06-21 DIAGNOSIS — J449 Chronic obstructive pulmonary disease, unspecified: Secondary | ICD-10-CM | POA: Diagnosis not present

## 2018-06-21 DIAGNOSIS — I5042 Chronic combined systolic (congestive) and diastolic (congestive) heart failure: Secondary | ICD-10-CM | POA: Diagnosis not present

## 2018-06-21 DIAGNOSIS — I480 Paroxysmal atrial fibrillation: Secondary | ICD-10-CM | POA: Diagnosis not present

## 2018-06-21 DIAGNOSIS — I50814 Right heart failure due to left heart failure: Secondary | ICD-10-CM | POA: Diagnosis not present

## 2018-06-21 DIAGNOSIS — E1142 Type 2 diabetes mellitus with diabetic polyneuropathy: Secondary | ICD-10-CM | POA: Diagnosis not present

## 2018-06-21 DIAGNOSIS — T8131XD Disruption of external operation (surgical) wound, not elsewhere classified, subsequent encounter: Secondary | ICD-10-CM | POA: Diagnosis not present

## 2018-06-21 DIAGNOSIS — J9611 Chronic respiratory failure with hypoxia: Secondary | ICD-10-CM | POA: Diagnosis not present

## 2018-06-21 DIAGNOSIS — I2510 Atherosclerotic heart disease of native coronary artery without angina pectoris: Secondary | ICD-10-CM | POA: Diagnosis not present

## 2018-06-21 DIAGNOSIS — I110 Hypertensive heart disease with heart failure: Secondary | ICD-10-CM | POA: Diagnosis not present

## 2018-06-22 DIAGNOSIS — I2510 Atherosclerotic heart disease of native coronary artery without angina pectoris: Secondary | ICD-10-CM | POA: Diagnosis not present

## 2018-06-22 DIAGNOSIS — I110 Hypertensive heart disease with heart failure: Secondary | ICD-10-CM | POA: Diagnosis not present

## 2018-06-22 DIAGNOSIS — I50814 Right heart failure due to left heart failure: Secondary | ICD-10-CM | POA: Diagnosis not present

## 2018-06-22 DIAGNOSIS — J449 Chronic obstructive pulmonary disease, unspecified: Secondary | ICD-10-CM | POA: Diagnosis not present

## 2018-06-22 DIAGNOSIS — I5042 Chronic combined systolic (congestive) and diastolic (congestive) heart failure: Secondary | ICD-10-CM | POA: Diagnosis not present

## 2018-06-22 DIAGNOSIS — T8131XD Disruption of external operation (surgical) wound, not elsewhere classified, subsequent encounter: Secondary | ICD-10-CM | POA: Diagnosis not present

## 2018-06-22 DIAGNOSIS — I480 Paroxysmal atrial fibrillation: Secondary | ICD-10-CM | POA: Diagnosis not present

## 2018-06-22 DIAGNOSIS — J9611 Chronic respiratory failure with hypoxia: Secondary | ICD-10-CM | POA: Diagnosis not present

## 2018-06-22 DIAGNOSIS — E1142 Type 2 diabetes mellitus with diabetic polyneuropathy: Secondary | ICD-10-CM | POA: Diagnosis not present

## 2018-06-24 DIAGNOSIS — I50814 Right heart failure due to left heart failure: Secondary | ICD-10-CM | POA: Diagnosis not present

## 2018-06-24 DIAGNOSIS — J9611 Chronic respiratory failure with hypoxia: Secondary | ICD-10-CM | POA: Diagnosis not present

## 2018-06-24 DIAGNOSIS — I480 Paroxysmal atrial fibrillation: Secondary | ICD-10-CM | POA: Diagnosis not present

## 2018-06-24 DIAGNOSIS — I110 Hypertensive heart disease with heart failure: Secondary | ICD-10-CM | POA: Diagnosis not present

## 2018-06-24 DIAGNOSIS — I2510 Atherosclerotic heart disease of native coronary artery without angina pectoris: Secondary | ICD-10-CM | POA: Diagnosis not present

## 2018-06-24 DIAGNOSIS — I5042 Chronic combined systolic (congestive) and diastolic (congestive) heart failure: Secondary | ICD-10-CM | POA: Diagnosis not present

## 2018-06-24 DIAGNOSIS — J449 Chronic obstructive pulmonary disease, unspecified: Secondary | ICD-10-CM | POA: Diagnosis not present

## 2018-06-24 DIAGNOSIS — T8131XD Disruption of external operation (surgical) wound, not elsewhere classified, subsequent encounter: Secondary | ICD-10-CM | POA: Diagnosis not present

## 2018-06-24 DIAGNOSIS — E1142 Type 2 diabetes mellitus with diabetic polyneuropathy: Secondary | ICD-10-CM | POA: Diagnosis not present

## 2018-06-25 DIAGNOSIS — T8131XD Disruption of external operation (surgical) wound, not elsewhere classified, subsequent encounter: Secondary | ICD-10-CM | POA: Diagnosis not present

## 2018-06-25 DIAGNOSIS — J449 Chronic obstructive pulmonary disease, unspecified: Secondary | ICD-10-CM | POA: Diagnosis not present

## 2018-06-25 DIAGNOSIS — E1142 Type 2 diabetes mellitus with diabetic polyneuropathy: Secondary | ICD-10-CM | POA: Diagnosis not present

## 2018-06-25 DIAGNOSIS — I2510 Atherosclerotic heart disease of native coronary artery without angina pectoris: Secondary | ICD-10-CM | POA: Diagnosis not present

## 2018-06-25 DIAGNOSIS — J9611 Chronic respiratory failure with hypoxia: Secondary | ICD-10-CM | POA: Diagnosis not present

## 2018-06-25 DIAGNOSIS — I110 Hypertensive heart disease with heart failure: Secondary | ICD-10-CM | POA: Diagnosis not present

## 2018-06-25 DIAGNOSIS — I480 Paroxysmal atrial fibrillation: Secondary | ICD-10-CM | POA: Diagnosis not present

## 2018-06-25 DIAGNOSIS — I50814 Right heart failure due to left heart failure: Secondary | ICD-10-CM | POA: Diagnosis not present

## 2018-06-25 DIAGNOSIS — I5042 Chronic combined systolic (congestive) and diastolic (congestive) heart failure: Secondary | ICD-10-CM | POA: Diagnosis not present

## 2018-06-27 ENCOUNTER — Emergency Department: Payer: Medicare HMO

## 2018-06-27 ENCOUNTER — Other Ambulatory Visit: Payer: Self-pay

## 2018-06-27 ENCOUNTER — Emergency Department
Admission: EM | Admit: 2018-06-27 | Discharge: 2018-06-27 | Disposition: A | Discharge status: Home / Self Care | Payer: Medicare HMO | Attending: Emergency Medicine | Admitting: Emergency Medicine

## 2018-06-27 DIAGNOSIS — K2900 Acute gastritis without bleeding: Secondary | ICD-10-CM | POA: Diagnosis not present

## 2018-06-27 DIAGNOSIS — Z79899 Other long term (current) drug therapy: Secondary | ICD-10-CM | POA: Insufficient documentation

## 2018-06-27 DIAGNOSIS — J449 Chronic obstructive pulmonary disease, unspecified: Secondary | ICD-10-CM | POA: Diagnosis not present

## 2018-06-27 DIAGNOSIS — I2510 Atherosclerotic heart disease of native coronary artery without angina pectoris: Secondary | ICD-10-CM | POA: Diagnosis not present

## 2018-06-27 DIAGNOSIS — R633 Feeding difficulties: Secondary | ICD-10-CM | POA: Diagnosis present

## 2018-06-27 DIAGNOSIS — E119 Type 2 diabetes mellitus without complications: Secondary | ICD-10-CM | POA: Diagnosis not present

## 2018-06-27 DIAGNOSIS — I10 Essential (primary) hypertension: Secondary | ICD-10-CM | POA: Insufficient documentation

## 2018-06-27 LAB — COMPREHENSIVE METABOLIC PANEL
ALBUMIN: 3.2 g/dL — AB (ref 3.5–5.0)
ALT: 12 U/L (ref 0–44)
AST: 21 U/L (ref 15–41)
Alkaline Phosphatase: 85 U/L (ref 38–126)
Anion gap: 11 (ref 5–15)
BUN: 24 mg/dL — ABNORMAL HIGH (ref 8–23)
CO2: 24 mmol/L (ref 22–32)
Calcium: 7.2 mg/dL — ABNORMAL LOW (ref 8.9–10.3)
Chloride: 102 mmol/L (ref 98–111)
Creatinine, Ser: 1.26 mg/dL — ABNORMAL HIGH (ref 0.61–1.24)
GFR calc Af Amer: >60 mL/min (ref >60)
GFR calc non Af Amer: >60 mL/min (ref >60)
GLUCOSE: 140 mg/dL — AB (ref 70–99)
Potassium: 4 mmol/L (ref 3.5–5.1)
Sodium: 137 mmol/L (ref 135–145)
Total Bilirubin: 1 mg/dL (ref 0.3–1.2)
Total Protein: 6.8 g/dL (ref 6.5–8.1)

## 2018-06-27 LAB — CBC
HCT: 30 % — ABNORMAL LOW (ref 39.0–52.0)
Hemoglobin: 8.8 g/dL — ABNORMAL LOW (ref 13.0–17.0)
MCH: 22.7 pg — AB (ref 26.0–34.0)
MCHC: 29.3 g/dL — ABNORMAL LOW (ref 30.0–36.0)
MCV: 77.5 fL — ABNORMAL LOW (ref 80.0–100.0)
Platelets: 195 10*3/uL (ref 150–400)
RBC: 3.87 MIL/uL — ABNORMAL LOW (ref 4.22–5.81)
RDW: 20.2 % — ABNORMAL HIGH (ref 11.5–15.5)
WBC: 6.3 10*3/uL (ref 4.0–10.5)
nRBC: 0 % (ref 0.0–0.2)

## 2018-06-27 LAB — URINALYSIS, COMPLETE (UACMP) WITH MICROSCOPIC
BILIRUBIN URINE: NEGATIVE
Bacteria, UA: NONE SEEN
Glucose, UA: NEGATIVE mg/dL
Hgb urine dipstick: NEGATIVE
Ketones, ur: NEGATIVE mg/dL
Leukocytes, UA: NEGATIVE
Nitrite: NEGATIVE
Protein, ur: 100 mg/dL — AB
Specific Gravity, Urine: 1.026 (ref 1.005–1.030)
pH: 5 (ref 5.0–8.0)

## 2018-06-27 LAB — LIPASE, BLOOD: Lipase: 51 U/L (ref 11–51)

## 2018-06-27 MED ORDER — SUCRALFATE 1 G PO TABS: 1.0000 g | ORAL_TABLET | Freq: Four times a day (QID) | ORAL | 1 refills | Status: AC

## 2018-06-27 MED ORDER — FAMOTIDINE IN NACL 20-0.9 MG/50ML-% IV SOLN
20.0000 mg | Freq: Once | INTRAVENOUS | Status: AC
  Administered 2018-06-27: 20 mg via INTRAVENOUS
  Filled 2018-06-27: qty 50

## 2018-06-27 MED ORDER — FAMOTIDINE 20 MG PO TABS: 20.0000 mg | ORAL_TABLET | Freq: Two times a day (BID) | ORAL | 0 refills | Status: AC

## 2018-06-27 NOTE — ED Provider Notes (Addendum)
[facility] Emergency Department Provider Note  ____________________________________________  Time seen: Approximately 8:47 PM  I have reviewed the triage vital signs and the nursing notes.   HISTORY  Chief Complaint Nausea    HPI NILE PRISK is a 63 y.o. male with a history of atrial fibrillation, CAD COPD diabetes hypertension and GERD who complains of difficulty eating for the past several days.  He reports "whenever I eat, it will not stay down, and it will come up."  It seems that he means that he has an upset stomach but does not have vomiting.  No constipation or diarrhea.  Denies any significant abdominal pain.  No chest pain or shortness of breath.  No fevers or chills.  Been taking his medications as prescribed.  He also reports that this morning, when is wife made bacon and eggs for breakfast it tasted very salty.    Past Medical History:  Diagnosis Date  . A-fib ([place])   . Aortic stenosis, moderate 11/2015  . Arthritis   . CAD (coronary artery disease)   . Chronic systolic CHF (congestive heart failure) ([place])   . COPD (chronic obstructive pulmonary disease) ([place])   . Diabetes mellitus without complication ([place])    INSULIN DEPENDENT  . GERD (gastroesophageal reflux disease)   . History of cardioversion   . Hypertension   . MI (myocardial infarction) ([place])   . OSA on CPAP   . Sepsis ([place])   . Shortness of breath dyspnea   . Stroke ([place])   . SVT (supraventricular tachycardia) [facility])      Patient Active Problem List   Diagnosis Date Noted  . Pressure injury of skin 05/20/2018  . Acute respiratory failure with hypoxemia ([place]) 05/19/2018  . Unstable angina ([place]) 12/03/2017  . Elevated troponin 11/29/2017  . Pneumonia 08/10/2017  . NSTEMI (non-ST elevated myocardial infarction) ([place]) 07/28/2017  . TIA (transient ischemic attack) 03/01/2017  . Aortic stenosis 11/21/2015  . Acute on chronic diastolic CHF (congestive heart failure), NYHA  class 3 ([place]) 11/21/2015  . Aortic valve, bicuspid 09/24/2015  . Cellulitis 05/04/2015  . Acute respiratory failure with hypoxia ([place]) 03/26/2015  . CHF (congestive heart failure) ([place]) 01/15/2015  . HTN (hypertension) 01/14/2015  . Type II diabetes mellitus ([place]) 01/14/2015  . COPD (chronic obstructive pulmonary disease) ([place]) 01/14/2015  . OSA on CPAP 01/14/2015  . CAD (coronary artery disease) 01/14/2015  . A-fib ([place]) 01/14/2015  . Pain of left calf 01/14/2015  . Acute on chronic systolic CHF (congestive heart failure) ([place]) 01/14/2015  . COPD exacerbation ([place]) 12/22/2014  . Diabetes ([place]) 10/14/2014     Past Surgical History:  Procedure Laterality Date  . 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 BUNIONECTOMY    . CARDIAC CATHETERIZATION N/A 10/02/2015   Procedure: Coronary/Grafts Angiography;  Surgeon: Teodoro Spray, MD;  Location: [place] CV LAB;  Service: Cardiovascular;  Laterality: N/A;  . CARDIAC CATHETERIZATION N/A 11/21/2015   Procedure: Right and Left Heart Cath;  Surgeon: Sherren Mocha, MD;  Location: [place] CV LAB;  Service: Cardiovascular;  Laterality: N/A;  . CORONARY ARTERY BYPASS GRAFT    . CORONARY STENT INTERVENTION N/A 07/30/2017   Procedure: CORONARY STENT INTERVENTION;  Surgeon: Isaias Cowman, MD;  Location: [place] CV [facility];  Service: Cardiovascular;  Laterality: N/A;  . CORONARY STENT INTERVENTION N/A 10/23/2017   Procedure: CORONARY STENT INTERVENTION;  Surgeon: Yolonda Kida, MD;  Location: [place] CV LAB;  Service: Cardiovascular;  Laterality: N/A;  . KNEE SURGERY    . LEFT HEART CATH AND CORONARY ANGIOGRAPHY N/A 10/30/2017   Procedure: LEFT HEART CATH AND CORONARY ANGIOGRAPHY;  Surgeon: Teodoro Spray, MD;  Location: [place] CV LAB;  Service: Cardiovascular;  Laterality: N/A;  . LEFT HEART CATH AND  CORS/GRAFTS ANGIOGRAPHY N/A 10/22/2017   Procedure: LEFT HEART CATH AND CORS/GRAFTS ANGIOGRAPHY;  Surgeon: Corey Skains, MD;  Location: [place] CV LAB;  Service: Cardiovascular;  Laterality: N/A;  . quadruple bypass    . RIGHT/LEFT HEART CATH AND CORONARY/GRAFT ANGIOGRAPHY N/A 07/30/2017   Procedure: RIGHT/LEFT HEART CATH AND CORONARY/GRAFT ANGIOGRAPHY;  Surgeon: Isaias Cowman, MD;  Location: [place] [facility] LAB;  Service: Cardiovascular;  Laterality: N/A;  . TEE WITHOUT CARDIOVERSION  11/21/2015  . TEE WITHOUT CARDIOVERSION N/A 11/21/2015   Procedure: TRANSESOPHAGEAL ECHOCARDIOGRAM (TEE);  Surgeon: Larey Dresser, MD;  Location: [place];  Service: Cardiovascular;  Laterality: N/A;  . TRACHEOSTOMY       Prior to Admission medications   Medication Sig Start Date End Date Taking? Authorizing Provider  acetaminophen (TYLENOL) 325 MG tablet Take 2 tablets (650 mg total) by mouth every 6 (six) hours as needed for moderate pain (headache). 05/27/18   [name], MD  albuterol (PROVENTIL HFA;VENTOLIN HFA) 108 (90 Base) MCG/ACT inhaler Inhale 2 puffs into the lungs every 6 (six) hours as needed for wheezing or shortness of breath. 07/11/15   Flora Lipps, MD  apixaban (ELIQUIS) 5 MG TABS tablet Take 5 mg by mouth 2 (two) times daily.    [provider]  atorvastatin (LIPITOR) 40 MG tablet Take 1 tablet (40 mg total) by mouth daily at 6 PM. 10/24/17   Gouru, Aruna, MD  budesonide (PULMICORT) 0.5 MG/2ML nebulizer solution Take 2 mLs (0.5 mg total) by nebulization 2 (two) times daily. 05/27/18   [name], MD  clopidogrel (PLAVIX) 75 MG tablet Take 1 tablet (75 mg total) by mouth daily with breakfast. Patient not taking: Reported on 05/19/2018 08/01/17   Max Sane, MD  DULoxetine (CYMBALTA) 20 MG capsule Take 20 mg by mouth daily.    [provider]  famotidine (PEPCID) 20 MG tablet Take 1 tablet (20 mg total) by mouth 2 (two) times daily. 06/27/18   Carrie Mew, MD  Fluticasone-Salmeterol (ADVAIR) 250-50 MCG/DOSE AEPB Inhale 1 puff into the lungs 2 (two) times daily. 11/30/17   Gladstone Lighter, MD  ipratropium-albuterol (DUONEB) 0.5-2.5 (3) MG/3ML SOLN Take 3 mLs by nebulization 2 (two) times daily. 05/27/18   [name], MD  metFORMIN (GLUCOPHAGE) 1000 MG tablet Take 1,000 mg by mouth 2 (two) times daily with a meal.    [provider]  metoprolol tartrate (LOPRESSOR) 25 MG tablet Take 1 tablet (25 mg total) by mouth 2 (two) times daily. 05/27/18   Gouru, Illene Silver, MD  oxyCODONE (OXY IR/ROXICODONE) 5 MG immediate release tablet Take 1-2 tablets (5-10 mg total) by mouth every 6 (six) hours as needed for moderate pain, severe pain or breakthrough pain. 06/01/18   Dustin Flock, MD  pantoprazole (PROTONIX) 40 MG tablet Take 1 tablet (40 mg total) by mouth 2 (two) times daily. 12/05/17   Gladstone Lighter, MD  polyethylene glycol (MIRALAX / GLYCOLAX) packet Take 17 g by mouth daily. 05/28/18   [name], MD  Potassium Chloride ER 20 MEQ TBCR Take 10 mEq by mouth daily. 05/27/18   [name], MD  pramipexole (MIRAPEX) 0.5 MG tablet Take 0.5 mg by mouth 3 (three) times daily as needed.     [provider]  senna (SENOKOT) 8.6 MG TABS tablet Take 1 tablet (8.6 mg total) by mouth daily as needed for mild constipation. 05/27/18   Gouru, Illene Silver, MD  sodium chloride (OCEAN) 0.65 % SOLN nasal spray Place 1 spray into both nostrils as needed for congestion. Patient not taking: Reported on 06/14/2018 05/27/18   [name], MD  sucralfate (CARAFATE) 1 g tablet Take 1 tablet (1 g total) by mouth 4 (four) times daily. 06/27/18   Carrie Mew, MD  torsemide (DEMADEX) 20 MG tablet Take 1 tablet (20 mg total) by mouth daily. 05/28/18   Gouru, Illene Silver, MD  zolpidem (AMBIEN) 5 MG tablet Take 1 tablet (5 mg total) by mouth at bedtime as needed for sleep. 11/01/17   Fritzi Mandes, MD     Allergies Contrast media [iodinated diagnostic agents]  and Versed [midazolam]   Family History  Problem Relation Age of Onset  . Stroke Mother   . Heart attack Mother   . Hypertension Mother   . Heart attack Father   . Hypertension Father   . Heart attack Brother        #1  . Diabetes Brother        #1  . Heart disease Brother        #2  . Lung disease Brother        #2  . Hypertension Brother        #2  . Diabetes Brother        #2    Social History Social History   Tobacco Use  . Smoking status: Never Smoker  . Smokeless tobacco: Never Used  Substance Use Topics  . Alcohol use: No  . Drug use: No    Review of Systems  Constitutional:   No fever or chills.  ENT:   No sore throat. No rhinorrhea. Cardiovascular:   No chest pain or syncope. Respiratory:   No dyspnea or cough. Gastrointestinal:   Negative for abdominal pain, vomiting and diarrhea.  Positive nausea Musculoskeletal:   Negative for focal pain or swelling All other systems reviewed and are negative except as documented above in ROS and HPI.  ____________________________________________   PHYSICAL EXAM:  VITAL SIGNS: ED Triage Vitals  Enc Vitals Group     BP 06/27/18 1401 114/73     Pulse Rate 06/27/18 1401 84     Resp 06/27/18 1401 18     Temp 06/27/18 1401 98.2 F (36.8 C)     Temp Source 06/27/18 1728 Oral     SpO2 06/27/18 1401 98 %     Weight 06/27/18 1401 236 lb (107 kg)     Height 06/27/18 1401 5\' 7"  (1.702 m)     Head Circumference --      Peak Flow --      Pain Score 06/27/18 1401 0     Pain Loc --      Pain Edu? --      Excl. in [place]? --     Vital signs reviewed, nursing assessments reviewed.   Constitutional:   Alert and oriented. Non-toxic appearance. Eyes:   Conjunctivae are normal. EOMI. PERRL. ENT      Head:   Normocephalic and atraumatic.      Nose:   No congestion/rhinnorhea.       Mouth/Throat:   MMM, no pharyngeal erythema. No peritonsillar mass.  Neck:   No meningismus. Full  ROM. Hematological/Lymphatic/Immunilogical:   No cervical lymphadenopathy. Cardiovascular:   RRR. Symmetric bilateral radial and DP pulses.  No murmurs. Cap refill less than 2 seconds. Respiratory:   Normal respiratory effort without tachypnea/retractions. Breath sounds are clear and equal bilaterally. No wheezes/rales/rhonchi. Gastrointestinal:   Soft with mild upper abdominal tenderness diffusely.. Non distended. There is no CVA tenderness.  No rebound, rigidity, or guarding.  Musculoskeletal:   Normal range of motion in all extremities. No joint effusions.  No lower extremity tenderness.  No edema. Neurologic:   Normal speech and language.  Motor grossly intact. No acute focal neurologic deficits are appreciated.  Skin:    Skin is warm, dry and intact. No rash noted.  No petechiae, purpura, or bullae.  ____________________________________________    LABS (pertinent positives/negatives) (all labs ordered are listed, but only abnormal results are displayed) Labs Reviewed  COMPREHENSIVE METABOLIC PANEL - Abnormal; Notable for the following components:      Result Value   Glucose, Bld 140 (*)    BUN 24 (*)    Creatinine, Ser 1.26 (*)    Calcium 7.2 (*)    Albumin 3.2 (*)    All other components within normal limits  CBC - Abnormal; Notable for the following components:   RBC 3.87 (*)    Hemoglobin 8.8 (*)    HCT 30.0 (*)    MCV 77.5 (*)    MCH 22.7 (*)    MCHC 29.3 (*)    RDW 20.2 (*)    All other components within normal limits  LIPASE, BLOOD  URINALYSIS, COMPLETE (UACMP) WITH MICROSCOPIC   ____________________________________________   EKG    ____________________________________________    RADIOLOGY  Dg Chest 2 View  Result Date: 06/27/2018 CLINICAL DATA:  Nausea with dry heaves.  Headache. EXAM: CHEST - 2 VIEW COMPARISON:  05/22/2018 and 05/19/2018 radiographs. FINDINGS: Stable cardiomegaly and aortic atherosclerosis status post median sternotomy, CABG and left  ventricular apical/aortic conduit placement. Diffuse interstitial prominence and left-greater-than-right basilar pulmonary opacities do not appear significantly changed. There is no pneumothorax or significant pleural effusion. No acute osseous findings are evident. IMPRESSION: Similar postoperative radiographic appearance of the chest with probable chronic vascular congestion and left basilar scarring. No apparent acute findings. Electronically Signed   By: Richardean Sale M.D.   On: 06/27/2018 18:50   US Abdomen Limited Ruq  Result Date: 06/27/2018 CLINICAL DATA:  Right upper quadrant pain for 1 day. EXAM: ULTRASOUND ABDOMEN LIMITED RIGHT UPPER QUADRANT COMPARISON:  05/22/2018 FINDINGS: Gallbladder: Gallbladder is contracted. Diffusely thickened gallbladder wall at 4.9 mm. Pericholecystic edema is present. No stones are identified. Murphy's sign is negative. Common bile duct: Diameter: 3.1 mm, normal Liver: Mildly heterogeneous parenchymal echotexture with nodular contour suggesting cirrhosis. Small amount of ascites around the liver edge. Portal vein is patent on color Doppler imaging with normal direction of blood flow towards the liver. IMPRESSION: 1. Gallbladder wall thickening and edema without stone and negative Murphy's sign. Changes likely related to cirrhosis and ascites. 2. Heterogeneous nodular appearance to the liver consistent with cirrhosis. Small amount of ascites. Electronically Signed   By: Lucienne Capers M.D.   On: 06/27/2018 19:07    ____________________________________________   PROCEDURES Procedures  ____________________________________________  DIFFERENTIAL DIAGNOSIS   Gastritis, pancreatitis, cholecystitis, choledocholithiasis  CLINICAL IMPRESSION / ASSESSMENT AND PLAN / ED COURSE  Pertinent labs & imaging results that were available during my care of the patient were reviewed by me and considered in my medical  decision making (see chart for details).    Patient  nontoxic, vital signs unremarkable, labs unremarkable and at baseline.  With his comorbidities and exam, when to be sure that this is not biliary so an ultrasound was obtained which is reassuring.  Patient feels better after receiving Pepcid and is now tolerating oral intake.  Continue Pepcid in addition to his other medicines, follow-up with primary care and gastroenterology this week for further evaluation of the symptoms.  Considering the patient's symptoms, medical history, and physical examination today, I have low suspicion for cholecystitis or biliary pathology, pancreatitis, perforation or bowel obstruction, hernia, intra-abdominal abscess, AAA or dissection, volvulus or intussusception, mesenteric ischemia, or appendicitis.    Clinical Course as of Jun 27 2045  Nancy Fetter Jun 27, 2018  1925 Ultrasound negative for gallstones.  Finds isolated gallbladder wall edema which they attribute to cirrhosis.  Normal white count, no fever, not consistent with cholecystitis.  Recommend aggressive antacid therapy, GI follow-up given overall negative work-up.   [PS]    Clinical Course User Index [PS] Carrie Mew, MD     ____________________________________________   FINAL CLINICAL IMPRESSION(S) / ED DIAGNOSES    Final diagnoses:  Acute gastritis without hemorrhage, unspecified gastritis type     ED Discharge Orders         Ordered    famotidine (PEPCID) 20 MG tablet  2 times daily     06/27/18 2047    sucralfate (CARAFATE) 1 g tablet  4 times daily     06/27/18 2047          Portions of this note were generated with dragon dictation software. Dictation errors may occur despite best attempts at proofreading.   Carrie Mew, MD 06/27/18 2050    Carrie Mew, MD 06/27/18 2050

## 2018-06-27 NOTE — ED Triage Notes (Addendum)
Pt comes via POV from home with c/o nausea and dry heaves and unable to keep anything down.  Pt states he hasn't been able to eat or keep anything down. Pt also c/o headache. Pt denies any chest pain or other symptoms.  Pt states everything tastes salty. Pt has hx of diabetes and takes medication as prescribed.  Pt states no relief with any OTC medications.

## 2018-06-27 NOTE — ED Notes (Signed)
Patient transported to X-ray 

## 2018-06-28 DIAGNOSIS — I50814 Right heart failure due to left heart failure: Secondary | ICD-10-CM | POA: Diagnosis not present

## 2018-06-28 DIAGNOSIS — J9611 Chronic respiratory failure with hypoxia: Secondary | ICD-10-CM | POA: Diagnosis not present

## 2018-06-28 DIAGNOSIS — I5042 Chronic combined systolic (congestive) and diastolic (congestive) heart failure: Secondary | ICD-10-CM | POA: Diagnosis not present

## 2018-06-28 DIAGNOSIS — J449 Chronic obstructive pulmonary disease, unspecified: Secondary | ICD-10-CM | POA: Diagnosis not present

## 2018-06-28 DIAGNOSIS — I110 Hypertensive heart disease with heart failure: Secondary | ICD-10-CM | POA: Diagnosis not present

## 2018-06-28 DIAGNOSIS — T8131XD Disruption of external operation (surgical) wound, not elsewhere classified, subsequent encounter: Secondary | ICD-10-CM | POA: Diagnosis not present

## 2018-06-28 DIAGNOSIS — E1142 Type 2 diabetes mellitus with diabetic polyneuropathy: Secondary | ICD-10-CM | POA: Diagnosis not present

## 2018-06-28 DIAGNOSIS — I480 Paroxysmal atrial fibrillation: Secondary | ICD-10-CM | POA: Diagnosis not present

## 2018-06-28 DIAGNOSIS — I2510 Atherosclerotic heart disease of native coronary artery without angina pectoris: Secondary | ICD-10-CM | POA: Diagnosis not present

## 2018-06-30 DIAGNOSIS — T8131XD Disruption of external operation (surgical) wound, not elsewhere classified, subsequent encounter: Secondary | ICD-10-CM | POA: Diagnosis not present

## 2018-06-30 DIAGNOSIS — I110 Hypertensive heart disease with heart failure: Secondary | ICD-10-CM | POA: Diagnosis not present

## 2018-06-30 DIAGNOSIS — I2510 Atherosclerotic heart disease of native coronary artery without angina pectoris: Secondary | ICD-10-CM | POA: Diagnosis not present

## 2018-06-30 DIAGNOSIS — I480 Paroxysmal atrial fibrillation: Secondary | ICD-10-CM | POA: Diagnosis not present

## 2018-06-30 DIAGNOSIS — J9611 Chronic respiratory failure with hypoxia: Secondary | ICD-10-CM | POA: Diagnosis not present

## 2018-06-30 DIAGNOSIS — J449 Chronic obstructive pulmonary disease, unspecified: Secondary | ICD-10-CM | POA: Diagnosis not present

## 2018-06-30 DIAGNOSIS — E1142 Type 2 diabetes mellitus with diabetic polyneuropathy: Secondary | ICD-10-CM | POA: Diagnosis not present

## 2018-06-30 DIAGNOSIS — I50814 Right heart failure due to left heart failure: Secondary | ICD-10-CM | POA: Diagnosis not present

## 2018-06-30 DIAGNOSIS — I5042 Chronic combined systolic (congestive) and diastolic (congestive) heart failure: Secondary | ICD-10-CM | POA: Diagnosis not present

## 2018-07-01 ENCOUNTER — Emergency Department (HOSPITAL_COMMUNITY): Payer: Medicare HMO

## 2018-07-01 ENCOUNTER — Inpatient Hospital Stay (HOSPITAL_COMMUNITY)
Admission: EM | Admit: 2018-07-01 | Discharge: 2018-07-17 | DRG: 871 | Disposition: E | Payer: Medicare HMO | Attending: Pulmonary Disease | Admitting: Pulmonary Disease

## 2018-07-01 ENCOUNTER — Encounter (HOSPITAL_COMMUNITY): Payer: Self-pay

## 2018-07-01 ENCOUNTER — Other Ambulatory Visit: Payer: Self-pay

## 2018-07-01 DIAGNOSIS — Z91041 Radiographic dye allergy status: Secondary | ICD-10-CM

## 2018-07-01 DIAGNOSIS — J449 Chronic obstructive pulmonary disease, unspecified: Secondary | ICD-10-CM | POA: Diagnosis present

## 2018-07-01 DIAGNOSIS — I350 Nonrheumatic aortic (valve) stenosis: Secondary | ICD-10-CM | POA: Diagnosis present

## 2018-07-01 DIAGNOSIS — F419 Anxiety disorder, unspecified: Secondary | ICD-10-CM | POA: Diagnosis present

## 2018-07-01 DIAGNOSIS — E875 Hyperkalemia: Secondary | ICD-10-CM | POA: Diagnosis not present

## 2018-07-01 DIAGNOSIS — F329 Major depressive disorder, single episode, unspecified: Secondary | ICD-10-CM | POA: Diagnosis present

## 2018-07-01 DIAGNOSIS — I959 Hypotension, unspecified: Secondary | ICD-10-CM | POA: Diagnosis not present

## 2018-07-01 DIAGNOSIS — R062 Wheezing: Secondary | ICD-10-CM

## 2018-07-01 DIAGNOSIS — I5043 Acute on chronic combined systolic (congestive) and diastolic (congestive) heart failure: Secondary | ICD-10-CM | POA: Diagnosis present

## 2018-07-01 DIAGNOSIS — J9690 Respiratory failure, unspecified, unspecified whether with hypoxia or hypercapnia: Secondary | ICD-10-CM

## 2018-07-01 DIAGNOSIS — I2510 Atherosclerotic heart disease of native coronary artery without angina pectoris: Secondary | ICD-10-CM | POA: Diagnosis present

## 2018-07-01 DIAGNOSIS — Z9911 Dependence on respirator [ventilator] status: Secondary | ICD-10-CM

## 2018-07-01 DIAGNOSIS — J690 Pneumonitis due to inhalation of food and vomit: Secondary | ICD-10-CM | POA: Diagnosis not present

## 2018-07-01 DIAGNOSIS — E1122 Type 2 diabetes mellitus with diabetic chronic kidney disease: Secondary | ICD-10-CM | POA: Diagnosis present

## 2018-07-01 DIAGNOSIS — K7460 Unspecified cirrhosis of liver: Secondary | ICD-10-CM | POA: Diagnosis present

## 2018-07-01 DIAGNOSIS — R9431 Abnormal electrocardiogram [ECG] [EKG]: Secondary | ICD-10-CM | POA: Diagnosis present

## 2018-07-01 DIAGNOSIS — I471 Supraventricular tachycardia: Secondary | ICD-10-CM | POA: Diagnosis present

## 2018-07-01 DIAGNOSIS — Z833 Family history of diabetes mellitus: Secondary | ICD-10-CM

## 2018-07-01 DIAGNOSIS — I429 Cardiomyopathy, unspecified: Secondary | ICD-10-CM | POA: Diagnosis present

## 2018-07-01 DIAGNOSIS — R6521 Severe sepsis with septic shock: Secondary | ICD-10-CM | POA: Diagnosis not present

## 2018-07-01 DIAGNOSIS — N183 Chronic kidney disease, stage 3 (moderate): Secondary | ICD-10-CM | POA: Diagnosis present

## 2018-07-01 DIAGNOSIS — I252 Old myocardial infarction: Secondary | ICD-10-CM

## 2018-07-01 DIAGNOSIS — Z79899 Other long term (current) drug therapy: Secondary | ICD-10-CM

## 2018-07-01 DIAGNOSIS — E11621 Type 2 diabetes mellitus with foot ulcer: Secondary | ICD-10-CM | POA: Diagnosis present

## 2018-07-01 DIAGNOSIS — B369 Superficial mycosis, unspecified: Secondary | ICD-10-CM | POA: Diagnosis present

## 2018-07-01 DIAGNOSIS — Z452 Encounter for adjustment and management of vascular access device: Secondary | ICD-10-CM

## 2018-07-01 DIAGNOSIS — L8990 Pressure ulcer of unspecified site, unspecified stage: Secondary | ICD-10-CM | POA: Diagnosis present

## 2018-07-01 DIAGNOSIS — E119 Type 2 diabetes mellitus without complications: Secondary | ICD-10-CM

## 2018-07-01 DIAGNOSIS — Z7984 Long term (current) use of oral hypoglycemic drugs: Secondary | ICD-10-CM

## 2018-07-01 DIAGNOSIS — Z8673 Personal history of transient ischemic attack (TIA), and cerebral infarction without residual deficits: Secondary | ICD-10-CM

## 2018-07-01 DIAGNOSIS — Z8619 Personal history of other infectious and parasitic diseases: Secondary | ICD-10-CM

## 2018-07-01 DIAGNOSIS — J101 Influenza due to other identified influenza virus with other respiratory manifestations: Secondary | ICD-10-CM | POA: Diagnosis not present

## 2018-07-01 DIAGNOSIS — L89151 Pressure ulcer of sacral region, stage 1: Secondary | ICD-10-CM | POA: Diagnosis present

## 2018-07-01 DIAGNOSIS — E869 Volume depletion, unspecified: Secondary | ICD-10-CM | POA: Diagnosis present

## 2018-07-01 DIAGNOSIS — Z4801 Encounter for change or removal of surgical wound dressing: Secondary | ICD-10-CM

## 2018-07-01 DIAGNOSIS — Z955 Presence of coronary angioplasty implant and graft: Secondary | ICD-10-CM

## 2018-07-01 DIAGNOSIS — Z888 Allergy status to other drugs, medicaments and biological substances status: Secondary | ICD-10-CM

## 2018-07-01 DIAGNOSIS — Z7901 Long term (current) use of anticoagulants: Secondary | ICD-10-CM

## 2018-07-01 DIAGNOSIS — D509 Iron deficiency anemia, unspecified: Secondary | ICD-10-CM | POA: Diagnosis present

## 2018-07-01 DIAGNOSIS — L304 Erythema intertrigo: Secondary | ICD-10-CM | POA: Diagnosis present

## 2018-07-01 DIAGNOSIS — Z7189 Other specified counseling: Secondary | ICD-10-CM

## 2018-07-01 DIAGNOSIS — L97509 Non-pressure chronic ulcer of other part of unspecified foot with unspecified severity: Secondary | ICD-10-CM | POA: Diagnosis present

## 2018-07-01 DIAGNOSIS — A4189 Other specified sepsis: Principal | ICD-10-CM | POA: Diagnosis present

## 2018-07-01 DIAGNOSIS — R809 Proteinuria, unspecified: Secondary | ICD-10-CM | POA: Diagnosis present

## 2018-07-01 DIAGNOSIS — I4892 Unspecified atrial flutter: Secondary | ICD-10-CM | POA: Diagnosis present

## 2018-07-01 DIAGNOSIS — T884XXA Failed or difficult intubation, initial encounter: Secondary | ICD-10-CM

## 2018-07-01 DIAGNOSIS — I5042 Chronic combined systolic (congestive) and diastolic (congestive) heart failure: Secondary | ICD-10-CM | POA: Diagnosis present

## 2018-07-01 DIAGNOSIS — I878 Other specified disorders of veins: Secondary | ICD-10-CM | POA: Diagnosis present

## 2018-07-01 DIAGNOSIS — Z823 Family history of stroke: Secondary | ICD-10-CM

## 2018-07-01 DIAGNOSIS — G4733 Obstructive sleep apnea (adult) (pediatric): Secondary | ICD-10-CM

## 2018-07-01 DIAGNOSIS — R0902 Hypoxemia: Secondary | ICD-10-CM

## 2018-07-01 DIAGNOSIS — Z952 Presence of prosthetic heart valve: Secondary | ICD-10-CM

## 2018-07-01 DIAGNOSIS — Z7951 Long term (current) use of inhaled steroids: Secondary | ICD-10-CM

## 2018-07-01 DIAGNOSIS — G9340 Encephalopathy, unspecified: Secondary | ICD-10-CM | POA: Diagnosis not present

## 2018-07-01 DIAGNOSIS — I130 Hypertensive heart and chronic kidney disease with heart failure and stage 1 through stage 4 chronic kidney disease, or unspecified chronic kidney disease: Secondary | ICD-10-CM | POA: Diagnosis present

## 2018-07-01 DIAGNOSIS — K219 Gastro-esophageal reflux disease without esophagitis: Secondary | ICD-10-CM | POA: Diagnosis present

## 2018-07-01 DIAGNOSIS — N179 Acute kidney failure, unspecified: Secondary | ICD-10-CM

## 2018-07-01 DIAGNOSIS — Z89421 Acquired absence of other right toe(s): Secondary | ICD-10-CM

## 2018-07-01 DIAGNOSIS — Z9981 Dependence on supplemental oxygen: Secondary | ICD-10-CM

## 2018-07-01 DIAGNOSIS — Z8249 Family history of ischemic heart disease and other diseases of the circulatory system: Secondary | ICD-10-CM

## 2018-07-01 DIAGNOSIS — Z515 Encounter for palliative care: Secondary | ICD-10-CM

## 2018-07-01 DIAGNOSIS — Z836 Family history of other diseases of the respiratory system: Secondary | ICD-10-CM

## 2018-07-01 DIAGNOSIS — K7200 Acute and subacute hepatic failure without coma: Secondary | ICD-10-CM | POA: Diagnosis present

## 2018-07-01 DIAGNOSIS — R0602 Shortness of breath: Secondary | ICD-10-CM

## 2018-07-01 DIAGNOSIS — Z9289 Personal history of other medical treatment: Secondary | ICD-10-CM

## 2018-07-01 DIAGNOSIS — A419 Sepsis, unspecified organism: Secondary | ICD-10-CM

## 2018-07-01 DIAGNOSIS — I445 Left posterior fascicular block: Secondary | ICD-10-CM | POA: Diagnosis present

## 2018-07-01 DIAGNOSIS — I10 Essential (primary) hypertension: Secondary | ICD-10-CM | POA: Diagnosis present

## 2018-07-01 DIAGNOSIS — Z8701 Personal history of pneumonia (recurrent): Secondary | ICD-10-CM

## 2018-07-01 DIAGNOSIS — J9601 Acute respiratory failure with hypoxia: Secondary | ICD-10-CM | POA: Diagnosis present

## 2018-07-01 DIAGNOSIS — Z89411 Acquired absence of right great toe: Secondary | ICD-10-CM

## 2018-07-01 DIAGNOSIS — N170 Acute kidney failure with tubular necrosis: Secondary | ICD-10-CM | POA: Diagnosis not present

## 2018-07-01 DIAGNOSIS — R570 Cardiogenic shock: Secondary | ICD-10-CM | POA: Diagnosis not present

## 2018-07-01 DIAGNOSIS — Z66 Do not resuscitate: Secondary | ICD-10-CM | POA: Diagnosis not present

## 2018-07-01 DIAGNOSIS — Z951 Presence of aortocoronary bypass graft: Secondary | ICD-10-CM

## 2018-07-01 DIAGNOSIS — G931 Anoxic brain damage, not elsewhere classified: Secondary | ICD-10-CM | POA: Diagnosis not present

## 2018-07-01 DIAGNOSIS — M1990 Unspecified osteoarthritis, unspecified site: Secondary | ICD-10-CM | POA: Diagnosis present

## 2018-07-01 DIAGNOSIS — Z794 Long term (current) use of insulin: Secondary | ICD-10-CM

## 2018-07-01 DIAGNOSIS — I4891 Unspecified atrial fibrillation: Secondary | ICD-10-CM | POA: Diagnosis present

## 2018-07-01 DIAGNOSIS — Z9989 Dependence on other enabling machines and devices: Secondary | ICD-10-CM

## 2018-07-01 DIAGNOSIS — I482 Chronic atrial fibrillation, unspecified: Secondary | ICD-10-CM | POA: Diagnosis present

## 2018-07-01 DIAGNOSIS — R188 Other ascites: Secondary | ICD-10-CM | POA: Diagnosis present

## 2018-07-01 DIAGNOSIS — Z6838 Body mass index (BMI) 38.0-38.9, adult: Secondary | ICD-10-CM

## 2018-07-01 DIAGNOSIS — E1151 Type 2 diabetes mellitus with diabetic peripheral angiopathy without gangrene: Secondary | ICD-10-CM | POA: Diagnosis present

## 2018-07-01 HISTORY — DX: Chronic combined systolic (congestive) and diastolic (congestive) heart failure: I50.42

## 2018-07-01 HISTORY — DX: Influenza due to other identified influenza virus with other respiratory manifestations: J10.1

## 2018-07-01 LAB — I-STAT CG4 LACTIC ACID, ED
Lactic Acid, Venous: 3.77 mmol/L (ref 0.5–1.9)
Lactic Acid, Venous: 4.99 mmol/L (ref 0.5–1.9)

## 2018-07-01 LAB — HEPATIC FUNCTION PANEL
ALT: 33 U/L (ref 0–44)
ALT: 48 U/L — ABNORMAL HIGH (ref 0–44)
AST: 94 U/L — ABNORMAL HIGH (ref 15–41)
AST: 141 U/L — ABNORMAL HIGH (ref 15–41)
Albumin: 2.7 g/dL — ABNORMAL LOW (ref 3.5–5.0)
Albumin: 2.7 g/dL — ABNORMAL LOW (ref 3.5–5.0)
Alkaline Phosphatase: 104 U/L (ref 38–126)
Alkaline Phosphatase: 105 U/L (ref 38–126)
Bilirubin, Direct: 0.6 mg/dL — ABNORMAL HIGH (ref 0.0–0.2)
Bilirubin, Direct: 0.7 mg/dL — ABNORMAL HIGH (ref 0.0–0.2)
Indirect Bilirubin: 1.1 mg/dL — ABNORMAL HIGH (ref 0.3–0.9)
Indirect Bilirubin: 1.1 mg/dL — ABNORMAL HIGH (ref 0.3–0.9)
TOTAL PROTEIN: 6.1 g/dL — AB (ref 6.5–8.1)
TOTAL PROTEIN: 6.3 g/dL — AB (ref 6.5–8.1)
Total Bilirubin: 1.7 mg/dL — ABNORMAL HIGH (ref 0.3–1.2)
Total Bilirubin: 1.8 mg/dL — ABNORMAL HIGH (ref 0.3–1.2)

## 2018-07-01 LAB — CBC WITH DIFFERENTIAL/PLATELET
Abs Immature Granulocytes: 0.03 10*3/uL (ref 0.00–0.07)
BASOS ABS: 0 10*3/uL (ref 0.0–0.1)
Basophils Relative: 0 %
Eosinophils Absolute: 0 10*3/uL (ref 0.0–0.5)
Eosinophils Relative: 0 %
HCT: 29.7 % — ABNORMAL LOW (ref 39.0–52.0)
Hemoglobin: 8.7 g/dL — ABNORMAL LOW (ref 13.0–17.0)
Immature Granulocytes: 1 %
Lymphocytes Relative: 17 %
Lymphs Abs: 1 10*3/uL (ref 0.7–4.0)
MCH: 22.2 pg — ABNORMAL LOW (ref 26.0–34.0)
MCHC: 29.3 g/dL — ABNORMAL LOW (ref 30.0–36.0)
MCV: 75.8 fL — ABNORMAL LOW (ref 80.0–100.0)
Monocytes Absolute: 0.5 10*3/uL (ref 0.1–1.0)
Monocytes Relative: 8 %
Neutro Abs: 4.4 10*3/uL (ref 1.7–7.7)
Neutrophils Relative %: 74 %
Platelets: 166 10*3/uL (ref 150–400)
RBC: 3.92 MIL/uL — ABNORMAL LOW (ref 4.22–5.81)
RDW: 20.6 % — ABNORMAL HIGH (ref 11.5–15.5)
WBC: 5.9 10*3/uL (ref 4.0–10.5)
nRBC: 0 % (ref 0.0–0.2)

## 2018-07-01 LAB — LACTIC ACID, PLASMA
LACTIC ACID, VENOUS: 4.2 mmol/L — AB (ref 0.5–1.9)
Lactic Acid, Venous: 5.2 mmol/L (ref 0.5–1.9)
Lactic Acid, Venous: 4.3 mmol/L (ref 0.5–1.9)
Lactic Acid, Venous: 6.8 mmol/L (ref 0.5–1.9)

## 2018-07-01 LAB — BASIC METABOLIC PANEL
Anion gap: 15 (ref 5–15)
BUN: 20 mg/dL (ref 8–23)
CHLORIDE: 101 mmol/L (ref 98–111)
CO2: 19 mmol/L — ABNORMAL LOW (ref 22–32)
Calcium: 7.3 mg/dL — ABNORMAL LOW (ref 8.9–10.3)
Creatinine, Ser: 1.38 mg/dL — ABNORMAL HIGH (ref 0.61–1.24)
GFR calc Af Amer: >60 mL/min (ref >60)
GFR calc non Af Amer: 54 mL/min — ABNORMAL LOW (ref >60)
Glucose, Bld: 99 mg/dL (ref 70–99)
Potassium: 4.8 mmol/L (ref 3.5–5.1)
Sodium: 135 mmol/L (ref 135–145)

## 2018-07-01 LAB — INFLUENZA PANEL BY PCR (TYPE A & B)
Influenza A By PCR: NEGATIVE
Influenza B By PCR: POSITIVE — AB

## 2018-07-01 LAB — I-STAT ARTERIAL BLOOD GAS, ED
Acid-base deficit: 5 mmol/L — ABNORMAL HIGH (ref 0.0–2.0)
BICARBONATE: 18.8 mmol/L — AB (ref 20.0–28.0)
O2 Saturation: 94 %
PO2 ART: 67 mmHg — AB (ref 83.0–108.0)
Patient temperature: 98.6
TCO2: 20 mmol/L — ABNORMAL LOW (ref 22–32)
pCO2 arterial: 29.2 mmHg — ABNORMAL LOW (ref 32.0–48.0)
pH, Arterial: 7.417 (ref 7.350–7.450)

## 2018-07-01 LAB — GLUCOSE, CAPILLARY
Glucose-Capillary: 168 mg/dL — ABNORMAL HIGH (ref 70–99)
Glucose-Capillary: 192 mg/dL — ABNORMAL HIGH (ref 70–99)
Glucose-Capillary: 131 mg/dL — ABNORMAL HIGH (ref 70–99)

## 2018-07-01 LAB — CBG MONITORING, ED: Glucose-Capillary: 111 mg/dL — ABNORMAL HIGH (ref 70–99)

## 2018-07-01 LAB — BRAIN NATRIURETIC PEPTIDE: B Natriuretic Peptide: 1735.6 pg/mL — ABNORMAL HIGH (ref 0.0–100.0)

## 2018-07-01 MED ORDER — CLOPIDOGREL BISULFATE 75 MG PO TABS
75.0000 mg | ORAL_TABLET | Freq: Every day | ORAL | Status: DC
  Filled 2018-07-01: qty 1

## 2018-07-01 MED ORDER — METHYLPREDNISOLONE SODIUM SUCC 125 MG IJ SOLR
125.0000 mg | Freq: Once | INTRAMUSCULAR | Status: AC
  Administered 2018-07-01: 125 mg via INTRAVENOUS
  Filled 2018-07-01: qty 2

## 2018-07-01 MED ORDER — SODIUM CHLORIDE 0.9 % IV BOLUS
1000.0000 mL | Freq: Once | INTRAVENOUS | Status: AC
  Administered 2018-07-01: 1000 mL via INTRAVENOUS

## 2018-07-01 MED ORDER — BUDESONIDE 0.5 MG/2ML IN SUSP
0.5000 mg | Freq: Two times a day (BID) | RESPIRATORY_TRACT | Status: DC
  Administered 2018-07-01 – 2018-07-06 (×11): 0.5 mg via RESPIRATORY_TRACT
  Filled 2018-07-01 (×13): qty 2

## 2018-07-01 MED ORDER — MOMETASONE FURO-FORMOTEROL FUM 200-5 MCG/ACT IN AERO
2.0000 | INHALATION_SPRAY | Freq: Two times a day (BID) | RESPIRATORY_TRACT | Status: DC
  Administered 2018-07-01 – 2018-07-02 (×3): 2 via RESPIRATORY_TRACT
  Filled 2018-07-01: qty 8.8

## 2018-07-01 MED ORDER — OSELTAMIVIR PHOSPHATE 75 MG PO CAPS
75.0000 mg | ORAL_CAPSULE | Freq: Two times a day (BID) | ORAL | Status: DC
  Administered 2018-07-01 – 2018-07-03 (×5): 75 mg via ORAL
  Filled 2018-07-01 (×6): qty 1

## 2018-07-01 MED ORDER — SODIUM CHLORIDE 0.9 % IV SOLN
2.0000 g | Freq: Once | INTRAVENOUS | Status: AC
  Administered 2018-07-01: 2 g via INTRAVENOUS
  Filled 2018-07-01: qty 2

## 2018-07-01 MED ORDER — FAMOTIDINE 20 MG PO TABS
20.0000 mg | ORAL_TABLET | Freq: Two times a day (BID) | ORAL | Status: DC
  Administered 2018-07-01 – 2018-07-03 (×6): 20 mg via ORAL
  Filled 2018-07-01 (×6): qty 1

## 2018-07-01 MED ORDER — VANCOMYCIN HCL 10 G IV SOLR: 1250.0000 mg | INTRAVENOUS | Status: DC

## 2018-07-01 MED ORDER — PANTOPRAZOLE SODIUM 40 MG PO TBEC
40.0000 mg | DELAYED_RELEASE_TABLET | Freq: Two times a day (BID) | ORAL | Status: DC
  Administered 2018-07-01 – 2018-07-03 (×6): 40 mg via ORAL
  Filled 2018-07-01 (×5): qty 1

## 2018-07-01 MED ORDER — SENNA 8.6 MG PO TABS: 1.0000 | ORAL_TABLET | Freq: Every day | ORAL | Status: DC | PRN

## 2018-07-01 MED ORDER — DULOXETINE HCL 20 MG PO CPEP
20.0000 mg | ORAL_CAPSULE | Freq: Every day | ORAL | Status: DC
  Administered 2018-07-01 – 2018-07-03 (×3): 20 mg via ORAL
  Filled 2018-07-01 (×4): qty 1

## 2018-07-01 MED ORDER — SUCRALFATE 1 G PO TABS
1.0000 g | ORAL_TABLET | Freq: Four times a day (QID) | ORAL | Status: DC
  Administered 2018-07-01 – 2018-07-03 (×9): 1 g via ORAL
  Filled 2018-07-01 (×12): qty 1

## 2018-07-01 MED ORDER — METOPROLOL TARTRATE 25 MG PO TABS
25.0000 mg | ORAL_TABLET | Freq: Two times a day (BID) | ORAL | Status: DC
  Administered 2018-07-01 – 2018-07-03 (×5): 25 mg via ORAL
  Filled 2018-07-01 (×5): qty 1

## 2018-07-01 MED ORDER — SODIUM CHLORIDE 0.9 % IV SOLN
INTRAVENOUS | Status: DC
  Administered 2018-07-01 (×2): via INTRAVENOUS

## 2018-07-01 MED ORDER — ZOLPIDEM TARTRATE 5 MG PO TABS
5.0000 mg | ORAL_TABLET | Freq: Every evening | ORAL | Status: DC | PRN
  Administered 2018-07-02: 5 mg via ORAL
  Filled 2018-07-01: qty 1

## 2018-07-01 MED ORDER — VANCOMYCIN HCL 10 G IV SOLR
2000.0000 mg | Freq: Once | INTRAVENOUS | Status: AC
  Administered 2018-07-01: 2000 mg via INTRAVENOUS
  Filled 2018-07-01: qty 2000

## 2018-07-01 MED ORDER — VANCOMYCIN HCL IN DEXTROSE 1-5 GM/200ML-% IV SOLN: 1000.0000 mg | Freq: Once | INTRAVENOUS | Status: DC

## 2018-07-01 MED ORDER — POLYETHYLENE GLYCOL 3350 17 G PO PACK
17.0000 g | PACK | Freq: Every day | ORAL | Status: DC
  Filled 2018-07-01 (×2): qty 1

## 2018-07-01 MED ORDER — INSULIN ASPART 100 UNIT/ML ~~LOC~~ SOLN
0.0000 [IU] | Freq: Three times a day (TID) | SUBCUTANEOUS | Status: DC
  Administered 2018-07-01 (×2): 3 [IU] via SUBCUTANEOUS
  Administered 2018-07-02 – 2018-07-04 (×2): 2 [IU] via SUBCUTANEOUS
  Administered 2018-07-04: 3 [IU] via SUBCUTANEOUS
  Administered 2018-07-05: 2 [IU] via SUBCUTANEOUS

## 2018-07-01 MED ORDER — METRONIDAZOLE IN NACL 5-0.79 MG/ML-% IV SOLN
500.0000 mg | Freq: Three times a day (TID) | INTRAVENOUS | Status: DC
  Administered 2018-07-01: 500 mg via INTRAVENOUS
  Filled 2018-07-01: qty 100

## 2018-07-01 MED ORDER — IPRATROPIUM-ALBUTEROL 0.5-2.5 (3) MG/3ML IN SOLN
3.0000 mL | Freq: Once | RESPIRATORY_TRACT | Status: AC
  Administered 2018-07-01: 3 mL via RESPIRATORY_TRACT
  Filled 2018-07-01: qty 3

## 2018-07-01 MED ORDER — ONDANSETRON HCL 4 MG PO TABS
4.0000 mg | ORAL_TABLET | Freq: Four times a day (QID) | ORAL | Status: DC | PRN
  Administered 2018-07-01: 4 mg via ORAL
  Filled 2018-07-01: qty 1

## 2018-07-01 MED ORDER — GLUCERNA SHAKE PO LIQD
237.0000 mL | Freq: Three times a day (TID) | ORAL | Status: DC
  Administered 2018-07-01 – 2018-07-03 (×4): 237 mL via ORAL
  Filled 2018-07-01 (×4): qty 237

## 2018-07-01 MED ORDER — ONDANSETRON HCL 4 MG/2ML IJ SOLN: 4.0000 mg | Freq: Four times a day (QID) | INTRAMUSCULAR | Status: DC | PRN

## 2018-07-01 MED ORDER — IPRATROPIUM-ALBUTEROL 0.5-2.5 (3) MG/3ML IN SOLN
3.0000 mL | Freq: Four times a day (QID) | RESPIRATORY_TRACT | Status: DC
  Administered 2018-07-01 – 2018-07-03 (×7): 3 mL via RESPIRATORY_TRACT
  Filled 2018-07-01 (×7): qty 3

## 2018-07-01 MED ORDER — ATORVASTATIN CALCIUM 40 MG PO TABS
40.0000 mg | ORAL_TABLET | Freq: Every day | ORAL | Status: DC
  Administered 2018-07-01 – 2018-07-03 (×3): 40 mg via ORAL
  Filled 2018-07-01 (×3): qty 1

## 2018-07-01 MED ORDER — OXYCODONE HCL 5 MG PO TABS
5.0000 mg | ORAL_TABLET | Freq: Four times a day (QID) | ORAL | Status: DC | PRN
  Administered 2018-07-01: 5 mg via ORAL
  Filled 2018-07-01: qty 2

## 2018-07-01 MED ORDER — HYDROXYZINE HCL 25 MG PO TABS
25.0000 mg | ORAL_TABLET | Freq: Three times a day (TID) | ORAL | Status: DC | PRN
  Administered 2018-07-01 – 2018-07-02 (×3): 25 mg via ORAL
  Filled 2018-07-01 (×3): qty 1

## 2018-07-01 MED ORDER — ACETAMINOPHEN 325 MG PO TABS: 650.0000 mg | ORAL_TABLET | Freq: Four times a day (QID) | ORAL | Status: DC | PRN

## 2018-07-01 MED ORDER — MAGNESIUM SULFATE 2 GM/50ML IV SOLN
2.0000 g | Freq: Once | INTRAVENOUS | Status: AC
  Administered 2018-07-01: 2 g via INTRAVENOUS
  Filled 2018-07-01: qty 50

## 2018-07-01 MED ORDER — ALPRAZOLAM 0.25 MG PO TABS
0.2500 mg | ORAL_TABLET | Freq: Once | ORAL | Status: AC
  Administered 2018-07-01: 0.25 mg via ORAL
  Filled 2018-07-01: qty 1

## 2018-07-01 MED ORDER — ACETAMINOPHEN 650 MG RE SUPP: 650.0000 mg | Freq: Four times a day (QID) | RECTAL | Status: DC | PRN

## 2018-07-01 MED ORDER — ALBUTEROL SULFATE (2.5 MG/3ML) 0.083% IN NEBU: 2.5000 mg | INHALATION_SOLUTION | RESPIRATORY_TRACT | Status: DC | PRN

## 2018-07-01 MED ORDER — OSELTAMIVIR PHOSPHATE 75 MG PO CAPS
75.0000 mg | ORAL_CAPSULE | Freq: Once | ORAL | Status: AC
  Administered 2018-07-01: 75 mg via ORAL
  Filled 2018-07-01: qty 1

## 2018-07-01 MED ORDER — SODIUM CHLORIDE 0.9% FLUSH
3.0000 mL | Freq: Two times a day (BID) | INTRAVENOUS | Status: DC
  Administered 2018-07-01 – 2018-07-04 (×5): 3 mL via INTRAVENOUS
  Administered 2018-07-04: 10 mL via INTRAVENOUS
  Administered 2018-07-05 – 2018-07-06 (×3): 3 mL via INTRAVENOUS

## 2018-07-01 MED ORDER — LORAZEPAM 0.5 MG PO TABS
0.5000 mg | ORAL_TABLET | Freq: Once | ORAL | Status: AC
  Administered 2018-07-01: 0.5 mg via ORAL
  Filled 2018-07-01: qty 1

## 2018-07-01 MED ORDER — APIXABAN 5 MG PO TABS
5.0000 mg | ORAL_TABLET | Freq: Two times a day (BID) | ORAL | Status: DC
  Administered 2018-07-01 – 2018-07-03 (×6): 5 mg via ORAL
  Filled 2018-07-01 (×6): qty 1

## 2018-07-01 MED ORDER — SODIUM CHLORIDE 0.9 % IV SOLN: 2.0000 g | Freq: Two times a day (BID) | INTRAVENOUS | Status: DC

## 2018-07-01 NOTE — Progress Notes (Signed)
Patient placed on CPAP for HS using nasal mask.   Patient used nasal pillows at home and was unable to tolerate nasal mask.  Patient plans to have family bring his pillows from home tomorrow.  100% on 3L nasal cannula.

## 2018-07-01 NOTE — Progress Notes (Signed)
Attempted for report.  

## 2018-07-01 NOTE — Progress Notes (Signed)
I called to discuss with PCCM, Dr. Lamonte Sakai.  Liver function normal this AM, will repeat.    Mild renal insufficiency compared to baseline.    Has been hemodynamically stable.  Does not appear to be consistent with sepsis syndrome.  Could be associated with metformin.  Consider stopping and holding until PCP follow up.  Will give 1 additional liter IVF.   Will continue to follow lactate - if stable, improving then does not require PCCM consultation.   Carlyon Shadow, M.D.

## 2018-07-01 NOTE — Consult Note (Signed)
[place] Nurse wound consult note Patient assessed in [facility] ED 031.  No family present. Reason for Consult: Left chest wound, gluteal fold MASD. Wound type: Left chest wound is a surgical site from cardiac surgery performed at [facility] for valve replacement.  This wound measures 0.3cm x 1.3 cm x 4 cm.  It has heavy yellow drainage oozing from the wound.  I cannot visualize the wound bed.  There is no odor.  The wound edge is rolled. The body fold adjacent to the wound has MASD intertriginous dermatitis and fungal rash. The gluteal fold has a very small (approximately 0.3 cm x 0.1 cm) fissure at the top of the fold that is related to MASD. The wound bed is pink. Dressing procedure/placement/frequency: For the chest wound: Pack 1/2 inch Iodoform packing into the left chest wound.  Leave a tail of the packing strip hanging out to ease removal. Cover with folded 4 x 4 gauze. Tape in place. Change each shift. For the body folds: cleanse with soap and water, pat dry, sprinkle antifungal powder into the folds.   For the gluteal fold:  Criticaid clear each shift and AVOID the use of sacral foam dressings. Monitor the wound area(s) for worsening of condition such as: Signs/symptoms of infection,  Increase in size,  Development of or worsening of odor, Development of pain, or increased pain at the affected locations.  Notify the medical team if any of these develop.  Thank you for the consult.  Discussed plan of care with the patient and bedside nurse.  [place] nurse will not follow at this time.  Please re-consult the [place] team if needed.  Val Riles, RN, MSN, CWOCN, CNS-BC, pager [phone]

## 2018-07-01 NOTE — ED Triage Notes (Signed)
Patient from home BIB [facility] for SOB and fever (103 at home) x 3 days. Patient reports hx of COPD and he is normally SOB but over the last few days it has been increasingly worse. States "It feels like a have pneumonia again, this is what happened last time".  Given 1000 mg Tylenol, 15 Albuterol, 1 mg Atrovent, 4 mg Zofran. Denies nausea, vomiting, abdominal pain, or dysuria.   BP 140/90 120 HR  44 RR  CBG 114

## 2018-07-01 NOTE — Progress Notes (Signed)
Report given to Nicole, RN

## 2018-07-01 NOTE — ED Notes (Signed)
Patient will not be receiving full standard amount of fluids for resuscitation r/t history of CHF. E-link aware.

## 2018-07-01 NOTE — ED Notes (Signed)
Wound care RN bedside 

## 2018-07-01 NOTE — Progress Notes (Signed)
Patient feeling SOB though on 02-3l and sats 100%. Having expiratory wheezing with diminished lungs. Lactic increased from 4.3 to 6.8 though on continuous fluids. Attending paged about finding and suggested patient needs to transferred out to inpatient to be monitored. See orders in epic. BP 133/90 (BP Location: Left Arm)   Pulse 90   Temp 98.6 F (37 C) (Oral)   Resp 20   Ht 5\' 8"  (1.727 m)   Wt 108.9 kg   SpO2 100%   BMI 36.49 kg/m

## 2018-07-01 NOTE — Progress Notes (Signed)
Pt admitted to 5C16 from ED s/p SOB and fever. Pt alert and oriented X 4. Pt oriented to room. Droplet precaution initiated d/t influenza.

## 2018-07-01 NOTE — ED Provider Notes (Addendum)
[facility] Provider Note   CSN: [id number] Arrival date & time: 06/20/2018  3762     History   Chief Complaint Chief Complaint  Patient presents with  . Fever  . Shortness of Breath    HPI Marcus Williams is a 63 y.o. male who presents via EMS for c/o repiratory distress.  He has  has a past medical history of A-fib [facility]), Aortic stenosis, moderate (11/2015), Arthritis, CAD (coronary artery disease), Chronic systolic CHF (congestive heart failure) ([place]), COPD (chronic obstructive pulmonary disease) ([place]), IDDM ([place]), GERD (gastroesophageal reflux disease), History of cardioversion, Hypertension, MI (myocardial infarction) ([place]), OSA on CPAP, Sepsis ([place]),  Stroke ([place]), and SVT (supraventricular tachycardia) ([place]). Patient complains of 3 days of worsening shortness of breath and a fever at home up to 103 F.  He has some baseline shortness of breath however has been much worse with increased wheezing.  He states he feels like he did when he had pneumonia in the past.  Prior to arrival the patient was given 15 of albuterol 1 mg of Atrovent thousand of Tylenol. He denies excessive weight gain or swelling. He denies CP. Patient c/o sore wound on his bottom.   HPI  Past Medical History:  Diagnosis Date  . A-fib ([place])   . Aortic stenosis, moderate 11/2015  . Arthritis   . CAD (coronary artery disease)   . Chronic systolic CHF (congestive heart failure) ([place])   . COPD (chronic obstructive pulmonary disease) ([place])   . Diabetes mellitus without complication ([place])    INSULIN DEPENDENT  . GERD (gastroesophageal reflux disease)   . History of cardioversion   . Hypertension   . MI (myocardial infarction) ([place])   . OSA on CPAP   . Sepsis ([place])   . Shortness of breath dyspnea   . Stroke ([place])   . SVT (supraventricular tachycardia) [facility])     Patient Active Problem List   Diagnosis Date Noted  . Pressure injury of skin 05/20/2018  . Acute  respiratory failure with hypoxemia ([place]) 05/19/2018  . Unstable angina ([place]) 12/03/2017  . Elevated troponin 11/29/2017  . Pneumonia 08/10/2017  . NSTEMI (non-ST elevated myocardial infarction) ([place]) 07/28/2017  . TIA (transient ischemic attack) 03/01/2017  . Aortic stenosis 11/21/2015  . Acute on chronic diastolic CHF (congestive heart failure), NYHA class 3 ([place]) 11/21/2015  . Aortic valve, bicuspid 09/24/2015  . Cellulitis 05/04/2015  . Acute respiratory failure with hypoxia ([place]) 03/26/2015  . CHF (congestive heart failure) ([place]) 01/15/2015  . HTN (hypertension) 01/14/2015  . Type II diabetes mellitus ([place]) 01/14/2015  . COPD (chronic obstructive pulmonary disease) ([place]) 01/14/2015  . OSA on CPAP 01/14/2015  . CAD (coronary artery disease) 01/14/2015  . A-fib ([place]) 01/14/2015  . Pain of left calf 01/14/2015  . Acute on chronic systolic CHF (congestive heart failure) ([place]) 01/14/2015  . COPD exacerbation ([place]) 12/22/2014  . Diabetes ([place]) 10/14/2014    Past Surgical History:  Procedure Laterality Date  . 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 BUNIONECTOMY    . CARDIAC CATHETERIZATION N/A 10/02/2015   Procedure: Coronary/Grafts Angiography;  Surgeon: Teodoro Spray, MD;  Location: [place] CV LAB;  Service: Cardiovascular;  Laterality: N/A;  . CARDIAC CATHETERIZATION N/A 11/21/2015   Procedure: Right and Left Heart Cath;  Surgeon: Sherren Mocha, MD;  Location: [place] CV LAB;  Service: Cardiovascular;  Laterality: N/A;  . CORONARY ARTERY BYPASS GRAFT    . CORONARY STENT INTERVENTION N/A 07/30/2017   Procedure: CORONARY STENT INTERVENTION;  Surgeon: Isaias Cowman, MD;  Location: [place] CV LAB;  Service: Cardiovascular;  Laterality: N/A;  . CORONARY STENT INTERVENTION N/A 10/23/2017   Procedure: CORONARY STENT INTERVENTION;  Surgeon: Yolonda Kida, MD;  Location: [place] CV LAB;  Service: Cardiovascular;  Laterality: N/A;  . KNEE SURGERY    . LEFT HEART CATH AND CORONARY ANGIOGRAPHY N/A 10/30/2017   Procedure: LEFT HEART CATH AND CORONARY ANGIOGRAPHY;  Surgeon: Teodoro Spray, MD;  Location: [place] CV LAB;  Service: Cardiovascular;  Laterality: N/A;  . LEFT HEART CATH AND CORS/GRAFTS ANGIOGRAPHY N/A 10/22/2017   Procedure: LEFT HEART CATH AND CORS/GRAFTS ANGIOGRAPHY;  Surgeon: Corey Skains, MD;  Location: [place] [facility];  Service: Cardiovascular;  Laterality: N/A;  . quadruple bypass    . RIGHT/LEFT HEART CATH AND CORONARY/GRAFT ANGIOGRAPHY N/A 07/30/2017   Procedure: RIGHT/LEFT HEART CATH AND CORONARY/GRAFT ANGIOGRAPHY;  Surgeon: Isaias Cowman, MD;  Location: [place] CV LAB;  Service: Cardiovascular;  Laterality: N/A;  . TEE WITHOUT CARDIOVERSION  11/21/2015  . TEE WITHOUT CARDIOVERSION N/A 11/21/2015   Procedure: TRANSESOPHAGEAL ECHOCARDIOGRAM (TEE);  Surgeon: Larey Dresser, MD;  Location: [facility];  Service: Cardiovascular;  Laterality: N/A;  . TRACHEOSTOMY          Home Medications    Prior to Admission medications   Medication Sig Start Date End Date Taking? Authorizing Provider  acetaminophen (TYLENOL) 325 MG tablet Take 2 tablets (650 mg total) by mouth every 6 (six) hours as needed for moderate pain (headache). 05/27/18   [name], MD  albuterol (PROVENTIL HFA;VENTOLIN HFA) 108 (90 Base) MCG/ACT inhaler Inhale 2 puffs into the lungs every 6 (six) hours as needed for wheezing or shortness of breath. 07/11/15   Flora Lipps, MD  apixaban (ELIQUIS) 5 MG TABS tablet Take 5 mg by mouth 2 (two) times daily.    [provider]  atorvastatin (LIPITOR) 40 MG tablet Take 1 tablet (40 mg total) by mouth daily at 6 PM. 10/24/17   Gouru, Aruna, MD  budesonide (PULMICORT) 0.5 MG/2ML nebulizer solution Take 2 mLs (0.5 mg total) by nebulization 2 (two) times daily. 05/27/18   [name],  MD  clopidogrel (PLAVIX) 75 MG tablet Take 1 tablet (75 mg total) by mouth daily with breakfast. Patient not taking: Reported on 05/19/2018 08/01/17   Max Sane, MD  DULoxetine (CYMBALTA) 20 MG capsule Take 20 mg by mouth daily.    [provider]  famotidine (PEPCID) 20 MG tablet Take 1 tablet (20 mg total) by mouth 2 (two) times daily. 06/27/18   Carrie Mew, MD  Fluticasone-Salmeterol (ADVAIR) 250-50 MCG/DOSE AEPB Inhale 1 puff into the lungs 2 (two) times daily. 11/30/17   [place]adstone Lighter, MD  ipratropium-albuterol (DUONEB) 0.5-2.5 (3) MG/3ML SOLN Take 3 mLs by nebulization 2 (two) times daily. 05/27/18   [name], MD  metFORMIN (GLUCOPHAGE) 1000 MG tablet Take 1,000 mg by mouth 2 (two) times daily with a meal.    [provider]  metoprolol tartrate (LOPRESSOR) 25 MG tablet Take 1 tablet (25 mg total) by mouth 2 (two) times daily. 05/27/18   Gouru, Illene Silver, MD  oxyCODONE (OXY IR/ROXICODONE) 5 MG immediate release tablet Take 1-2 tablets (5-10 mg total) by mouth every 6 (six) hours as needed for moderate pain, severe pain or breakthrough pain. 06/01/18   Dustin Flock, MD  pantoprazole (PROTONIX) 40 MG tablet Take 1 tablet (40 mg total) by mouth 2 (two) times daily. 12/05/17   [place]stone Lighter, MD  polyethylene glycol (MIRALAX / GLYCOLAX) packet Take 17 g by mouth daily. 05/28/18   [name], MD  Potassium Chloride ER 20 MEQ TBCR Take 10 mEq by mouth daily. 05/27/18   [name], MD  pramipexole (MIRAPEX) 0.5 MG tablet Take 0.5 mg by mouth 3 (three) times daily as needed.     [provider]  senna (SENOKOT) 8.6 MG TABS tablet Take 1 tablet (8.6 mg total) by mouth daily as needed for mild constipation. 05/27/18   Gouru, Illene Silver, MD  sodium chloride (OCEAN) 0.65 % SOLN nasal spray Place 1 spray into both nostrils as needed for congestion. Patient not taking: Reported on 06/14/2018 05/27/18   [name], MD  sucralfate (CARAFATE) 1 g tablet Take 1 tablet  (1 g total) by mouth 4 (four) times daily. 06/27/18   Carrie Mew, MD  torsemide (DEMADEX) 20 MG tablet Take 1 tablet (20 mg total) by mouth daily. 05/28/18   Gouru, Illene Silver, MD  zolpidem (AMBIEN) 5 MG tablet Take 1 tablet (5 mg total) by mouth at bedtime as needed for sleep. 11/01/17   Fritzi Mandes, MD    Family History Family History  Problem Relation Age of Onset  . Stroke Mother   . Heart attack Mother   . Hypertension Mother   . Heart attack Father   . Hypertension Father   . Heart attack Brother        #1  . Diabetes Brother        #1  . Heart disease Brother        #2  . Lung disease Brother        #2  . Hypertension Brother        #2  . Diabetes Brother        #2    Social History Social History   Tobacco Use  . Smoking status: Never Smoker  . Smokeless tobacco: Never Used  Substance Use Topics  . Alcohol use: No  . Drug use: No     Allergies   Contrast media [iodinated diagnostic agents] and Versed [midazolam]   Review of Systems Review of Systems  Ten systems reviewed and are negative for acute change, except as noted in the HPI.   Physical Exam Updated Vital Signs BP 125/86 (BP Location: Right Arm)   Pulse (!) 154   Temp (!) 102.9 F (39.4 C) (Rectal)   Resp (!) 30   Ht 5\' 8"  (1.727 m)   Wt 108.9 kg   SpO2 96%   BMI 36.49 kg/m   Physical Exam Vitals signs and nursing note reviewed.  Constitutional:      Appearance: He is well-developed. He is obese. He is ill-appearing and toxic-appearing.  HENT:     Head: Normocephalic and atraumatic.     Mouth/Throat:     Mouth: Mucous membranes are dry.  Eyes:     General: Scleral icterus present.     Extraocular Movements: Extraocular movements intact.     Pupils: Pupils are equal, round, and reactive to light.  Neck:     Musculoskeletal: Normal range of motion.     Vascular: JVD (unable to assess due to body habitus,) present.  Cardiovascular:     Rate and Rhythm: Tachycardia present.    Pulmonary:     Effort: Tachypnea present.     Breath sounds: Examination of the  right-upper field reveals wheezing. Examination of the left-upper field reveals wheezing. Examination of the right-middle field reveals wheezing. Examination of the left-middle field reveals wheezing. Examination of the right-lower field reveals wheezing. Examination of the left-lower field reveals wheezing. Wheezing present.  Chest:     Chest wall: Tenderness present.     Comments: Macerated intertrigo of the left chest/axilla  Old wound with packing (I removed) macerated tissue present in the wound without purulent drainage. Musculoskeletal:     Right lower leg: Edema present.     Left lower leg: Edema present.  Skin:    General: Skin is warm.     Comments: Hot/ flushed Stage 1 pressure wound of the gluteal cleft Associated macerated intertrigo with mild tissue breakdown.  Neurological:     General: No focal deficit present.     Mental Status: He is alert and oriented to person, place, and time.  Psychiatric:        Mood and Affect: Mood is anxious.      ED Treatments / Results  Labs (all labs ordered are listed, but only abnormal results are displayed) Labs Reviewed  CBC WITH DIFFERENTIAL/PLATELET - Abnormal; Notable for the following components:      Result Value   RBC 3.92 (*)    Hemoglobin 8.7 (*)    HCT 29.7 (*)    MCV 75.8 (*)    MCH 22.2 (*)    MCHC 29.3 (*)    RDW 20.6 (*)    All other components within normal limits  CULTURE, BLOOD (ROUTINE X 2)  CULTURE, BLOOD (ROUTINE X 2)  BASIC METABOLIC PANEL  BRAIN NATRIURETIC PEPTIDE  INFLUENZA PANEL BY PCR (TYPE A & B)  URINALYSIS, ROUTINE W REFLEX MICROSCOPIC  HEPATIC FUNCTION PANEL  I-STAT TROPONIN, ED  I-STAT CG4 LACTIC ACID, ED  I-STAT ARTERIAL BLOOD GAS, ED    EKG None  Radiology Dg Chest Port 1 View  Result Date: 07/16/2018 CLINICAL DATA:  Shortness of breath EXAM: PORTABLE CHEST 1 VIEW COMPARISON:  06/27/2018 FINDINGS:  Increased interstitial markings. Chronic bibasilar opacities. No focal consolidation or frank interstitial edema. Small left pleural effusion. No pneumothorax. Chronic postsurgical changes at the left lung base related to left ventricular apical/aortic conduit. Cardiomegaly.  Postsurgical changes related to prior CABG. Median sternotomy. IMPRESSION: Stable postsurgical and chronic findings, as above. Electronically Signed   By: Julian Hy M.D.   On: 07/15/2018 04:05    Procedures .Critical Care Performed by: Margarita Mail, PA-C Authorized by: Margarita Mail, PA-C   Critical care provider statement:    Critical care time (minutes):  50   Critical care was necessary to treat or prevent imminent or life-threatening deterioration of the following conditions:  Sepsis and respiratory failure   Critical care was time spent personally by me on the following activities:  Discussions with consultants, evaluation of patient's response to treatment, examination of patient, ordering and performing treatments and interventions, ordering and review of laboratory studies, ordering and review of radiographic studies, pulse oximetry, re-evaluation of patient's condition, obtaining history from patient or surrogate and review of old charts   (including critical care time)  Medications Ordered in ED Medications  ipratropium-albuterol (DUONEB) 0.5-2.5 (3) MG/3ML nebulizer solution 3 mL (has no administration in time range)  ceFEPIme (MAXIPIME) 2 g in sodium chloride 0.9 % 100 mL IVPB (has no administration in time range)  metroNIDAZOLE (FLAGYL) IVPB 500 mg (has no administration in time range)     Initial Impression / Assessment and Plan /  ED Course  I have reviewed the triage vital signs and the nursing notes.  Pertinent labs & imaging results that were available during my care of the patient were reviewed by me and considered in my medical decision making (see chart for details).  Clinical Course  as of Jul 01 632  Thu Jul 01, 2018  0447 Baseline hgb   Hemoglobin(!): 8.7 [AH]  0447 CO2(!): 19 [AH]  0520 B Natriuretic Peptide(!): 1,735.6 [AH]  0521 Lactic Acid, Venous(!!): 3.77 [AH]  0550 Repeat EKG shows multifocal atrial tachycardia.   [AH]    Clinical Course User Index [AH] Margarita Mail, PA-C    63 year old male who presents with weakness of breath and fever.  He is positive for flu B, elevated BNP appears to be baseline for this patient.  Previous EF of 40 to 50%.  Patient's hemoglobin at 8.7 also appears to be at baseline.  He does not have an elevated white blood cell count.  Lactic acid elevated at 3.77 which may represent sepsis versus dehydration in the setting of fever and increased respiratory rate.  Patient pressure has remained stable throughout.  He is tachycardic with tachypnea however he is also febrile.  Fever treated with 1 g of Tylenol prior to arrival.  This also occurs in the setting of multiple breathing treatments.  Patient was treated with the sepsis protocol and given broad-spectrum antibiotics within the first hour.  Have given his fluids more slowly to avoid volume overload in the setting of patient with CHF.  Patient chest x-ray does not show any pulmonary edema or focal infiltrate.  Patient will be admitted to the hospitalist service.  His breathing status has improved.  Final Clinical Impressions(s) / ED Diagnoses   Final diagnoses:  None    ED Discharge Orders    None       Margarita Mail, PA-C 06/30/2018 [id number]    Fatima Blank, MD 06/30/2018 [place], [place], PA-C 08/01/18 0741    Fatima Blank, MD 08/06/18 2312

## 2018-07-01 NOTE — Progress Notes (Signed)
Pharmacy Antibiotic Note  Marcus Williams is a 63 y.o. male admitted on 07/02/2018 with fever with unknown source.  Pharmacy has been consulted for Vancomycin/Cefepime dosing. WBC WNL. Mild bump in Scr. Lactic acid elevated.   Plan: Vancomycin 2000 mg IV x 1, then 1250 mg IV q24h >>Estimated AUC: 468 Cefepime 2g IV q12h Flagyl per MD Trend WBC, temp, renal function  F/U infectious work-up Drug levels as indicated   Height: 5\' 8"  (172.7 cm) Weight: 240 lb (108.9 kg) IBW/kg (Calculated) : 68.4  Temp (24hrs), Avg:102.9 F (39.4 C), Min:102.9 F (39.4 C), Max:102.9 F (39.4 C)  Recent Labs  Lab 06/27/18 1403 07/12/2018 0354 07/10/2018 0449  WBC 6.3 5.9  --   CREATININE 1.26* 1.38*  --   LATICACIDVEN  --   --  3.77*    Estimated Creatinine Clearance: 66.4 mL/min (A) (by C-G formula based on SCr of 1.38 mg/dL (H)).    Allergies  Allergen Reactions  . Contrast Media [Iodinated Diagnostic Agents] Shortness Of Breath    Hypoxia/hypotension  . Versed [Midazolam] Shortness Of Breath and Other (See Comments)    Hypotension    Narda Bonds 06/16/2018 5:54 AM

## 2018-07-01 NOTE — ED Notes (Signed)
Patient repositioned, sitting on side of bed for ease of coughing.

## 2018-07-01 NOTE — H&P (Signed)
History and Physical    Marcus Williams NWG:[id number] DOB: 1955-11-22 DOA: 07/10/2018  PCP: Idelle Crouch, MD Consultants:  Maryann Conners CT surgery; Fath - cardiology; Cleda Mccreedy - podiatry Patient coming from:  Home - lives with wife; NOK: Wife, [phone]  Chief Complaint: Fever, SOB  HPI: Marcus Williams is a 63 y.o. male with medical history significant of SVT; CVA; OSA on CPAP; CAD; HTN; DM; COPD; afib; and chronic systolic heart failure presenting with fever and SOB.  He has a device to check his heart rate.  Marcus Williams was there and he had an appointment to see the doctor.  His heart was beating 150s but he was going to the doctor so the [facility] didn't act on it.  But then he went to the doctor at the wrong time and couldn't be seen.  He woke up his wife overnight because he could feel his heart racing.  "And I feel like like a new man right now.  I'd go home right now if they would let me."  +SOB but "no more than usual."  Cough, nonproductive.  +dry heaves.  +fever, 103-104.  Nothing tastes right.  Fever started about 3AM.  He is vague about when his symptoms started, other than the fever.  He feels like he needs something to calm his nerves down.  He has been in the hospital so long that this makes him very uncomfortable.  "A month ago, I died.  I was at the [facility].  Let me start back to the beginning and that way I catch you up real quick."  3 months ago (8/11-9/24), he was admitted at [facility] for 6 weeks - see below.  He went to SNF rehab and is better now.  About a month ago, the preacher at [employer] called his wife and said he had 6 hours to live.  They called his family in and 4 hours later "I'm as good as you are.  But I showed off" - he pulled his lines and foley and had to have his hands tied down.  The doctor "told my wife, I ain't never seen a joker that's got 21 lives."  He is allergic to contrast dye and this was what almost killed him.  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  He experienced a PEA arrest and stabilized with cardiac massage and epinephrine.  He had afib and was stabilized on Amiodarone, metoprolol, and apixaban.  He had trach but this was decannulated prior to discharge.  He developed C diff during his hospitalization and this was effectively treated.  He was noted to have a diabetic foot ulcer with recommendation for amputation int he future; he has had a remote R foot hallux amputation.  He also had a sacral pressure ulcer.  He was admitted at [facility] from 12/4-17 for acute on chronic combined CHF with COPD component and cardiorenal syndrome.  He had bradycardia and hypotension which required temporary pacing but resolved without indication for permanent pacer.  He qualified for home O2 and also was inconsistent with CPAP use.  He had a stage 2 pressure ulcer and a left chest wound from prior surgery.  He returned to the ER with nausea on  1/12.  [place] was negative for gallstones.  He was discharged with GI f/u.   ED Course:  Carryover, per Dr. Shanon Brow:  Pending admit 63 yo male with sob has flu, h/o copd, chf. cxr neg. Lactic 3.7. EF 45%. Repeat lactic pending. bp nml. Tachy but may be from all the albuterol. Temp over 103. Repeat temp pending. Tachpneic, wheezing. Solumedrol given. In process of giving second liter of ivf and abx. Per PA he has improved since arrival and is in no resp distress at this time.  Review of Systems: As per HPI; otherwise review of systems reviewed and negative.   Ambulatory Status:  Ambulates without assistance  Past Medical History:  Diagnosis Date  . A-fib ([place])    on Pradaxa  . Aortic stenosis, moderate 11/2015  . Arthritis   . CAD (coronary artery disease)   . Chronic  combined systolic (congestive) and diastolic (congestive) heart failure ([place])   . COPD (chronic obstructive pulmonary disease) (HCC)    2L home O2  . Diabetes mellitus without complication ([place])    INSULIN DEPENDENT  . GERD (gastroesophageal reflux disease)   . History of cardioversion   . Hypertension   . Influenza B 06/2018  . MI (myocardial infarction) ([place])   . OSA on CPAP   . Sepsis ([place])   . Stroke ([place])   . SVT (supraventricular tachycardia) (HCC)     Past Surgical History:  Procedure Laterality Date  . 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 BUNIONECTOMY    . CARDIAC CATHETERIZATION N/A 10/02/2015   Procedure: Coronary/Grafts Angiography;  Surgeon: Teodoro Spray, MD;  Location: [place] CV LAB;  Service: Cardiovascular;  Laterality: N/A;  . CARDIAC CATHETERIZATION N/A 11/21/2015   Procedure: Right and Left Heart Cath;  Surgeon: Sherren Mocha, MD;  Location: [place] CV LAB;  Service: Cardiovascular;  Laterality: N/A;  . CORONARY STENT INTERVENTION N/A 07/30/2017   Procedure: CORONARY STENT INTERVENTION;  Surgeon: Isaias Cowman, MD;  Location: [place] [facility] LAB;  Service: Cardiovascular;  Laterality: N/A;  . CORONARY STENT INTERVENTION N/A 10/23/2017   Procedure: CORONARY STENT INTERVENTION;  Surgeon: Yolonda Kida, MD;  Location: [place] [facility] LAB;  Service: Cardiovascular;  Laterality: N/A;  . KNEE SURGERY    . LEFT HEART CATH AND CORONARY ANGIOGRAPHY N/A 10/30/2017   Procedure: LEFT HEART CATH AND CORONARY ANGIOGRAPHY;  Surgeon: Teodoro Spray, MD;  Location: [place] [facility] LAB;  Service: Cardiovascular;  Laterality: N/A;  . LEFT HEART CATH AND CORS/GRAFTS ANGIOGRAPHY N/A 10/22/2017   Procedure: LEFT HEART CATH AND CORS/GRAFTS ANGIOGRAPHY;  Surgeon: Corey Skains, MD;  Location: [place] [facility];  Service: Cardiovascular;  Laterality:  N/A;  . RIGHT/LEFT HEART CATH AND CORONARY/GRAFT ANGIOGRAPHY N/A 07/30/2017   Procedure: RIGHT/LEFT HEART CATH AND CORONARY/GRAFT ANGIOGRAPHY;  Surgeon: Isaias Cowman, MD;  Location: [place] [facility];  Service: Cardiovascular;  Laterality: N/A;  . TEE WITHOUT CARDIOVERSION N/A 11/21/2015   Procedure: TRANSESOPHAGEAL ECHOCARDIOGRAM (TEE);  Surgeon: Larey Dresser, MD;  Location: [place];  Service: Cardiovascular;  Laterality: N/A;  . TRACHEOSTOMY    . TRACHEOSTOMY CLOSURE      Social History   Socioeconomic History  . Marital status: Married    Spouse name: Not on file  . Number of children: Not on file  . Years of education: Not on file  . Highest education level:  Not on file  Occupational History  . Occupation: disabled  Social Needs  . Financial resource strain: Not on file  . Food insecurity:    Worry: Not on file    Inability: Not on file  . Transportation needs:    Medical: Not on file    Non-medical: Not on file  Tobacco Use  . Smoking status: Never Smoker  . Smokeless tobacco: Never Used  Substance and Sexual Activity  . Alcohol use: No  . Drug use: No  . Sexual activity: Yes  Lifestyle  . Physical activity:    Days per week: Not on file    Minutes per session: Not on file  . Stress: Not on file  Relationships  . Social connections:    Talks on phone: Not on file    Gets together: Not on file    Attends religious service: Not on file    Active member of club or organization: Not on file    Attends meetings of clubs or organizations: Not on file    Relationship status: Not on file  . Intimate partner violence:    Fear of current or ex partner: Not on file    Emotionally abused: Not on file    Physically abused: Not on file    Forced sexual activity: Not on file  Other Topics Concern  . Not on file  Social History Narrative  . Not on file    Allergies  Allergen Reactions  . Contrast Media [Iodinated Diagnostic Agents] Shortness Of Breath      Hypoxia/hypotension  . Versed [Midazolam] Shortness Of Breath and Other (See Comments)    Hypotension    Family History  Problem Relation Age of Onset  . Stroke Mother   . Heart attack Mother   . Hypertension Mother   . Heart attack Father   . Hypertension Father   . Heart attack Brother        #1  . Diabetes Brother        #1  . Heart disease Brother        #2  . Lung disease Brother        #2  . Hypertension Brother        #2  . Diabetes Brother        #2    Prior to Admission medications   Medication Sig Start Date End Date Taking? Authorizing Provider  acetaminophen (TYLENOL) 325 MG tablet Take 2 tablets (650 mg total) by mouth every 6 (six) hours as needed for moderate pain (headache). 05/27/18   [name], MD  albuterol (PROVENTIL HFA;VENTOLIN HFA) 108 (90 Base) MCG/ACT inhaler Inhale 2 puffs into the lungs every 6 (six) hours as needed for wheezing or shortness of breath. 07/11/15   Flora Lipps, MD  apixaban (ELIQUIS) 5 MG TABS tablet Take 5 mg by mouth 2 (two) times daily.    [provider]  atorvastatin (LIPITOR) 40 MG tablet Take 1 tablet (40 mg total) by mouth daily at 6 PM. 10/24/17   Gouru, Aruna, MD  budesonide (PULMICORT) 0.5 MG/2ML nebulizer solution Take 2 mLs (0.5 mg total) by nebulization 2 (two) times daily. 05/27/18   [name], MD  clopidogrel (PLAVIX) 75 MG tablet Take 1 tablet (75 mg total) by mouth daily with breakfast. Patient not taking: Reported on 05/19/2018 08/01/17   Max Sane, MD  DULoxetine (CYMBALTA) 20 MG capsule Take 20 mg by mouth daily.    [provider]  famotidine (PEPCID)  20 MG tablet Take 1 tablet (20 mg total) by mouth 2 (two) times daily. 06/27/18   Carrie Mew, MD  Fluticasone-Salmeterol (ADVAIR) 250-50 MCG/DOSE AEPB Inhale 1 puff into the lungs 2 (two) times daily. 11/30/17   Gladstone Lighter, MD  ipratropium-albuterol (DUONEB) 0.5-2.5 (3) MG/3ML SOLN Take 3 mLs by nebulization 2 (two) times daily.  05/27/18   [name], MD  metFORMIN (GLUCOPHAGE) 1000 MG tablet Take 1,000 mg by mouth 2 (two) times daily with a meal.    [provider]  metoprolol tartrate (LOPRESSOR) 25 MG tablet Take 1 tablet (25 mg total) by mouth 2 (two) times daily. 05/27/18   Gouru, Illene Silver, MD  oxyCODONE (OXY IR/ROXICODONE) 5 MG immediate release tablet Take 1-2 tablets (5-10 mg total) by mouth every 6 (six) hours as needed for moderate pain, severe pain or breakthrough pain. 06/01/18   Dustin Flock, MD  pantoprazole (PROTONIX) 40 MG tablet Take 1 tablet (40 mg total) by mouth 2 (two) times daily. 12/05/17   Gladstone Lighter, MD  polyethylene glycol (MIRALAX / GLYCOLAX) packet Take 17 g by mouth daily. 05/28/18   [name], MD  Potassium Chloride ER 20 MEQ TBCR Take 10 mEq by mouth daily. 05/27/18   [name], MD  pramipexole (MIRAPEX) 0.5 MG tablet Take 0.5 mg by mouth 3 (three) times daily as needed.     [provider]  senna (SENOKOT) 8.6 MG TABS tablet Take 1 tablet (8.6 mg total) by mouth daily as needed for mild constipation. 05/27/18   Gouru, Illene Silver, MD  sodium chloride (OCEAN) 0.65 % SOLN nasal spray Place 1 spray into both nostrils as needed for congestion. Patient not taking: Reported on 06/14/2018 05/27/18   [name], MD  sucralfate (CARAFATE) 1 g tablet Take 1 tablet (1 g total) by mouth 4 (four) times daily. 06/27/18   Carrie Mew, MD  torsemide (DEMADEX) 20 MG tablet Take 1 tablet (20 mg total) by mouth daily. 05/28/18   Gouru, Illene Silver, MD  zolpidem (AMBIEN) 5 MG tablet Take 1 tablet (5 mg total) by mouth at bedtime as needed for sleep. 11/01/17   Fritzi Mandes, MD    Physical Exam: Vitals:   06/28/2018 0800 07/08/2018 0815 06/22/2018 0930 07/11/2018 1005  BP: 117/79 129/90 (!) 103/92 123/87  Pulse: (!) 114 (!) 114 (!) 112 (!) 111  Resp: (!) 33 (!) 33 (!) 24 20  Temp:   98 F (36.7 C) 98.7 F (37.1 C)  TempSrc:   Oral Oral  SpO2: 99% 100% 100% 99%  Weight:      Height:          . General:  Appears calm and comfortable and is NAD . Eyes:  PERRL, EOMI, normal lids, iris . ENT:  grossly normal hearing, lips & tongue, mmm; appropriate dentition . Neck:  no LAD, masses or thyromegaly . Cardiovascular: Afib, HR 110 range, no m/r/g. No LE edema.  Marland Kitchen Respiratory:   CTA bilaterally with no wheezes/rales/rhonchi.  Mildly increased respiratory effort on [place] O2. . Abdomen:  soft, NT, ND, NABS . Back:   normal alignment, no CVAT . Skin: no rash or induration seen on limited exam; chest wall wound along left axillary line . Musculoskeletal:  grossly normal tone BUE/BLE, good ROM, no bony abnormality . Psychiatric:  Mildly anxious mood and affect, speech fluent and appropriate, AOx3 . Neurologic:  CN 2-12 grossly intact, moves all extremities in coordinated fashion, sensation intact    Radiological Exams on Admission: Dg Chest Port 1  View  Result Date: 06/23/2018 CLINICAL DATA:  Shortness of breath EXAM: PORTABLE CHEST 1 VIEW COMPARISON:  06/27/2018 FINDINGS: Increased interstitial markings. Chronic bibasilar opacities. No focal consolidation or frank interstitial edema. Small left pleural effusion. No pneumothorax. Chronic postsurgical changes at the left lung base related to left ventricular apical/aortic conduit. Cardiomegaly.  Postsurgical changes related to prior CABG. Median sternotomy. IMPRESSION: Stable postsurgical and chronic findings, as above. Electronically Signed   By: Julian Hy M.D.   On: 06/30/2018 04:05    EKG: Independently reviewed.   61 - Aflutter with rate 156; LPFB with no evidence of acute ischemia 0546 - Afib with rate 126; LPFB; prolonged QT 503; nonspecific ST changes with no evidence of acute ischemia  Labs on Admission: I have personally reviewed the available labs and imaging studies at the time of the admission.  Pertinent labs:   CO2 19 BUN 20/Creatinine 1.38/GFR 54 - baseline 0.8 as of 06/15/18, 1.26 on 1/12 Albumin 2.7 BNP  1735.6; 1939.0 on 05/19/18 Lactate 3.77, 4.99 ABG: 7.417/29.2/67.0/18.8 WBC 5.9 Hgb 8.7 - stable Influenza B positive   Assessment/Plan Principal Problem:   Influenza B Active Problems:   HTN (hypertension)   Type II diabetes mellitus (HCC)   COPD (chronic obstructive pulmonary disease) (HCC)   OSA on CPAP   A-fib (HCC)   Pressure injury of skin   Chronic combined systolic (congestive) and diastolic (congestive) heart failure (HCC)   Influenza -Supportive care -Tamiflu BID -IVF at 75 cc/hr -Observation on telemetry -Anticipate resolution of tachycardia and improvement in symptoms as flu improves  Chronic combined CHF -Echo 12/4 with EF 45-50% -With uptrending lactate and mildly elevated creatinine, the patient appears to have ongoing volume depletion and so will hold Demadex for now and give gentle IVF hydration -There is no current evidence of decompensation  DM -Recent A1c of 6.6 in 12/19 demonstrates reasonable control -hold Glucophage -Cover with moderate-scale SSI  COPD -May have some mild COPD component, although this does not appear to be an exacerbation at this time -Continue ome Pulmicort, Advair, and standing Duonebs (increased to q6h) -Add prn Albuterol  HTN -Continue Lopresor  Afib on AC -On Eliquis -Poor rate control on arrival but this has improved -Continue to monitor on telemetry for now -Continue Lopressor  OSA on CPAP -Continue CPAP -He should be encouraged to improve his compliance  Pressure injury of skin -Patient with prior h/o sacral pressure ulcer  -Also with h/o diabetic foot ulcer, followed by podiatry -Also with left chest wounds with minimal drainage, no obvious purulence -Wound care consult requested  CAD -s/p stents, CABG -On Plavix, Lipitor  Depression/Anxiety -Continue Cymbalta -Will add PO Ativan prn anxiety  DVT prophylaxis: Eliquis Code Status:  Full - confirmed with patient Family Communication: None  present Disposition Plan:  Home once clinically improved Consults called: Wound care  Admission status: It is my clinical opinion that referral for OBSERVATION is reasonable and necessary in this patient based on the above information provided. The aforementioned taken together are felt to place the patient at high risk for further clinical deterioration. However it is anticipated that the patient may be medically stable for discharge from the hospital within 24 to 48 hours.    Karmen Bongo MD [facility]  If note is complete, please contact covering daytime or nighttime physician. [email or web address]   06/30/2018, 10:56 AM

## 2018-07-02 ENCOUNTER — Observation Stay (HOSPITAL_COMMUNITY): Payer: Medicare HMO

## 2018-07-02 DIAGNOSIS — R570 Cardiogenic shock: Secondary | ICD-10-CM | POA: Diagnosis not present

## 2018-07-02 DIAGNOSIS — I959 Hypotension, unspecified: Secondary | ICD-10-CM | POA: Diagnosis not present

## 2018-07-02 DIAGNOSIS — I4892 Unspecified atrial flutter: Secondary | ICD-10-CM | POA: Diagnosis present

## 2018-07-02 DIAGNOSIS — I130 Hypertensive heart and chronic kidney disease with heart failure and stage 1 through stage 4 chronic kidney disease, or unspecified chronic kidney disease: Secondary | ICD-10-CM | POA: Diagnosis present

## 2018-07-02 DIAGNOSIS — L89151 Pressure ulcer of sacral region, stage 1: Secondary | ICD-10-CM | POA: Diagnosis present

## 2018-07-02 DIAGNOSIS — Z9911 Dependence on respirator [ventilator] status: Secondary | ICD-10-CM | POA: Diagnosis not present

## 2018-07-02 DIAGNOSIS — I4891 Unspecified atrial fibrillation: Secondary | ICD-10-CM

## 2018-07-02 DIAGNOSIS — J9621 Acute and chronic respiratory failure with hypoxia: Secondary | ICD-10-CM | POA: Diagnosis not present

## 2018-07-02 DIAGNOSIS — I429 Cardiomyopathy, unspecified: Secondary | ICD-10-CM | POA: Diagnosis present

## 2018-07-02 DIAGNOSIS — I5042 Chronic combined systolic (congestive) and diastolic (congestive) heart failure: Secondary | ICD-10-CM | POA: Diagnosis not present

## 2018-07-02 DIAGNOSIS — N179 Acute kidney failure, unspecified: Secondary | ICD-10-CM | POA: Diagnosis not present

## 2018-07-02 DIAGNOSIS — I471 Supraventricular tachycardia: Secondary | ICD-10-CM | POA: Diagnosis present

## 2018-07-02 DIAGNOSIS — J101 Influenza due to other identified influenza virus with other respiratory manifestations: Secondary | ICD-10-CM | POA: Diagnosis not present

## 2018-07-02 DIAGNOSIS — J410 Simple chronic bronchitis: Secondary | ICD-10-CM | POA: Diagnosis not present

## 2018-07-02 DIAGNOSIS — I480 Paroxysmal atrial fibrillation: Secondary | ICD-10-CM | POA: Diagnosis not present

## 2018-07-02 DIAGNOSIS — A4189 Other specified sepsis: Secondary | ICD-10-CM | POA: Diagnosis present

## 2018-07-02 DIAGNOSIS — L97509 Non-pressure chronic ulcer of other part of unspecified foot with unspecified severity: Secondary | ICD-10-CM | POA: Diagnosis present

## 2018-07-02 DIAGNOSIS — R6521 Severe sepsis with septic shock: Secondary | ICD-10-CM | POA: Diagnosis not present

## 2018-07-02 DIAGNOSIS — I509 Heart failure, unspecified: Secondary | ICD-10-CM | POA: Diagnosis not present

## 2018-07-02 DIAGNOSIS — R0902 Hypoxemia: Secondary | ICD-10-CM | POA: Diagnosis not present

## 2018-07-02 DIAGNOSIS — K7200 Acute and subacute hepatic failure without coma: Secondary | ICD-10-CM | POA: Diagnosis present

## 2018-07-02 DIAGNOSIS — G4733 Obstructive sleep apnea (adult) (pediatric): Secondary | ICD-10-CM | POA: Diagnosis not present

## 2018-07-02 DIAGNOSIS — G9340 Encephalopathy, unspecified: Secondary | ICD-10-CM | POA: Diagnosis not present

## 2018-07-02 DIAGNOSIS — Z7189 Other specified counseling: Secondary | ICD-10-CM | POA: Diagnosis not present

## 2018-07-02 DIAGNOSIS — I5043 Acute on chronic combined systolic (congestive) and diastolic (congestive) heart failure: Secondary | ICD-10-CM | POA: Diagnosis present

## 2018-07-02 DIAGNOSIS — J9601 Acute respiratory failure with hypoxia: Secondary | ICD-10-CM | POA: Diagnosis not present

## 2018-07-02 DIAGNOSIS — J449 Chronic obstructive pulmonary disease, unspecified: Secondary | ICD-10-CM | POA: Diagnosis present

## 2018-07-02 DIAGNOSIS — J690 Pneumonitis due to inhalation of food and vomit: Secondary | ICD-10-CM | POA: Diagnosis not present

## 2018-07-02 DIAGNOSIS — Z515 Encounter for palliative care: Secondary | ICD-10-CM | POA: Diagnosis not present

## 2018-07-02 DIAGNOSIS — J9622 Acute and chronic respiratory failure with hypercapnia: Secondary | ICD-10-CM | POA: Diagnosis not present

## 2018-07-02 DIAGNOSIS — N170 Acute kidney failure with tubular necrosis: Secondary | ICD-10-CM | POA: Diagnosis not present

## 2018-07-02 DIAGNOSIS — I482 Chronic atrial fibrillation, unspecified: Secondary | ICD-10-CM | POA: Diagnosis present

## 2018-07-02 DIAGNOSIS — Z66 Do not resuscitate: Secondary | ICD-10-CM | POA: Diagnosis not present

## 2018-07-02 DIAGNOSIS — G931 Anoxic brain damage, not elsewhere classified: Secondary | ICD-10-CM | POA: Diagnosis not present

## 2018-07-02 DIAGNOSIS — R188 Other ascites: Secondary | ICD-10-CM | POA: Diagnosis present

## 2018-07-02 DIAGNOSIS — K7291 Hepatic failure, unspecified with coma: Secondary | ICD-10-CM | POA: Diagnosis not present

## 2018-07-02 LAB — BASIC METABOLIC PANEL
Anion gap: 17 — ABNORMAL HIGH (ref 5–15)
BUN: 31 mg/dL — AB (ref 8–23)
CO2: 15 mmol/L — ABNORMAL LOW (ref 22–32)
Calcium: 7.4 mg/dL — ABNORMAL LOW (ref 8.9–10.3)
Chloride: 103 mmol/L (ref 98–111)
Creatinine, Ser: 1.85 mg/dL — ABNORMAL HIGH (ref 0.61–1.24)
GFR calc Af Amer: 44 mL/min — ABNORMAL LOW (ref >60)
GFR calc non Af Amer: 38 mL/min — ABNORMAL LOW (ref >60)
Glucose, Bld: 119 mg/dL — ABNORMAL HIGH (ref 70–99)
POTASSIUM: 5.3 mmol/L — AB (ref 3.5–5.1)
SODIUM: 135 mmol/L (ref 135–145)

## 2018-07-02 LAB — CBC
HCT: 30.1 % — ABNORMAL LOW (ref 39.0–52.0)
Hemoglobin: 8.7 g/dL — ABNORMAL LOW (ref 13.0–17.0)
MCH: 22.8 pg — ABNORMAL LOW (ref 26.0–34.0)
MCHC: 28.9 g/dL — ABNORMAL LOW (ref 30.0–36.0)
MCV: 79 fL — ABNORMAL LOW (ref 80.0–100.0)
Platelets: 157 10*3/uL (ref 150–400)
RBC: 3.81 MIL/uL — ABNORMAL LOW (ref 4.22–5.81)
RDW: 20.5 % — ABNORMAL HIGH (ref 11.5–15.5)
WBC: 10.6 10*3/uL — AB (ref 4.0–10.5)
nRBC: 0.3 % — ABNORMAL HIGH (ref 0.0–0.2)

## 2018-07-02 LAB — LACTIC ACID, PLASMA
Lactic Acid, Venous: 8.5 mmol/L (ref 0.5–1.9)
Lactic Acid, Venous: 5.5 mmol/L (ref 0.5–1.9)
Lactic Acid, Venous: 4.6 mmol/L (ref 0.5–1.9)

## 2018-07-02 LAB — GLUCOSE, CAPILLARY
Glucose-Capillary: 110 mg/dL — ABNORMAL HIGH (ref 70–99)
Glucose-Capillary: 133 mg/dL — ABNORMAL HIGH (ref 70–99)
Glucose-Capillary: 106 mg/dL — ABNORMAL HIGH (ref 70–99)
Glucose-Capillary: 94 mg/dL (ref 70–99)

## 2018-07-02 LAB — HIV ANTIBODY (ROUTINE TESTING W REFLEX): HIV Screen 4th Generation wRfx: NONREACTIVE

## 2018-07-02 MED ORDER — SODIUM CHLORIDE 0.9 % IV SOLN
INTRAVENOUS | Status: DC
  Administered 2018-07-02: 04:00:00 via INTRAVENOUS

## 2018-07-02 MED ORDER — MORPHINE SULFATE (PF) 2 MG/ML IV SOLN
2.0000 mg | Freq: Once | INTRAVENOUS | Status: AC
  Administered 2018-07-02: 2 mg via INTRAVENOUS
  Filled 2018-07-02: qty 1

## 2018-07-02 NOTE — Care Management Note (Addendum)
Case Management Note  Patient Details  Name: Marcus Williams MRN: [id number] Date of Birth: 1956-05-06  Subjective/Objective:    From home, influenza B. Patient is active with [facility] for HHRN, HHPT, will need resumption orders prior to dc.                Action/Plan: Will need resumption orders ro HHRN, HHPT prior to dc.  Expected Discharge Date:                  Expected Discharge Plan:  Home/Self Care  In-House Referral:     Discharge planning Services  CM Consult  Post Acute Care Choice:    Choice offered to:     DME Arranged:    DME Agency:     HH Arranged:    [place] Agency:     Status of Service:  Completed, signed off  If discussed at [employer] of Stay Meetings, dates discussed:    Additional Comments:  Zenon Mayo, RN 07/02/2018, 12:16 PM

## 2018-07-02 NOTE — Progress Notes (Signed)
Lactate continued to trend up to 8.5. Spoke with PCCM who recommended more IV fluids and Morphine 2mg  IV x 1 to help with his tachypnea. Will continue to cycle lactate.  Arby Barrette AGPCNP-BC, AGNP-C [facility] Pager [phone]

## 2018-07-02 NOTE — Progress Notes (Signed)
Progress Note    Marcus Williams  EVO:[id number] DOB: [birth year]  DOA: 06/30/2018 PCP: Marcus Crouch, MD    Brief Narrative:    Admitting note: (complicated historian with complicated medical history of recent)  Marcus Williams is an 63 y.o. male with medical history significant of SVT; CVA; OSA on CPAP; CAD; HTN; DM; COPD; afib; and chronic systolic heart failure presenting with fever and SOB.  He has a device to check his heart rate.  Marcus Williams was there and he had an appointment to see the doctor.  His heart was beating 150s but he was going to the doctor so the [facility] didn't act on it.  But then he went to the doctor at the wrong time and couldn't be seen.  He woke up his wife overnight because he could feel his heart racing.  "And I feel like like a new man right now.  I'd go home right now if they would let me."  +SOB but "no more than usual."  Cough, nonproductive.  +dry heaves.  +fever, 103-104.  Nothing tastes right.  Fever started about 3AM.  He is vague about when his symptoms started, other than the fever.  He feels like he needs something to calm his nerves down.  He has been in the hospital so long that this makes him very uncomfortable.  "A month ago, I died.  I was at the [facility].  Let me start back to the beginning and that way I catch you up real quick."  3 months ago (8/11-9/24), he was admitted at [facility] for 6 weeks - see below.  He went to SNF rehab and is better now.  About a month ago, the preacher at [employer] called his wife and said he had 6 hours to live.  They called his family in and 4 hours later "I'm as good as you are.  But I showed off" - he pulled his lines and foley and had to have his hands tied down.  The doctor "told my wife, I ain't never seen a joker that's got 67 lives."  He is allergic to contrast dye and this was what almost killed him.  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  He experienced a PEA arrest and stabilized with cardiac massage and epinephrine.  He had afib and was stabilized on Amiodarone, metoprolol, and apixaban.  He had trach but this was decannulated prior to discharge.  He developed C diff during his hospitalization and this was effectively treated.  He was noted to have a diabetic foot ulcer with recommendation for amputation int he future; he has had a remote R foot hallux amputation.  He also had a sacral pressure ulcer.  He was admitted at [facility] from 12/4-17 for acute on chronic combined CHF with COPD component and cardiorenal syndrome.  He had bradycardia and hypotension which required temporary pacing but resolved without indication for permanent pacer.  He qualified for home O2 and also was inconsistent with CPAP use.  He had a stage 2 pressure ulcer and a left chest wound from prior surgery.  Assessment/Plan:   Principal Problem:   Influenza B Active Problems:   HTN (hypertension)   Type II  diabetes mellitus (HCC)   COPD (chronic obstructive pulmonary disease) (HCC)   OSA on CPAP   A-fib (HCC)   Pressure injury of skin   Chronic combined systolic (congestive) and diastolic (congestive) heart failure (HCC)  Influenza B -Supportive care -Tamiflu BID -d/c IVF as it appears he is moving more towards volume overload  AKI  -strict I/Os -not sure he is emptying his bladder-- ? Blockage -PRN bladder scan -Cr 1 month ago was 1.13 -during last admission at [facility] had a component of cardiorenal syndrome and was seen by renal  Obesity Estimated body mass index is 36.49 kg/m as calculated from the following:   Height as of this encounter: 5\' 8"  (1.727 m).   Weight as of this encounter: 108.9 kg.  -weight down 30lbs from december  Lactic acidosis -? From acute  illness vs metformin use -trending down  Chronic combined CHF -Echo 12/4 with EF 45-50% -BP on lower side so demadex on hold-- watch for volume overload  DM -Recent A1c of 6.6 in 12/19 demonstrates reasonable control -hold Glucophage -Cover with moderate-scale SSI  COPD -May have some mild COPD component, although this does not appear to be an exacerbation at this time -Continue ome Pulmicort, Advair, and standing Duonebs (increased to q6h) -Add prn Albuterol  HTN -Continue Lopresor  Afib on AC -On Eliquis  -Poor rate control on arrival but this has improved -Continue to monitor on telemetry for now -Continue Lopressor  OSA on CPAP -Continue CPAP  Pressure injury of skin -Patient with prior h/o sacral pressure ulcer  -Also with h/o diabetic foot ulcer, followed by podiatry -Also with left chest wounds with minimal drainage, no obvious purulence  CAD -s/p stents, CABG -On Plavix, Lipitor  Depression/Anxiety -Continue Cymbalta    Family Communication/Anticipated D/C date and plan/Code Status   DVT prophylaxis:  Code Status: Full Code.  Family Communication:  Disposition Plan: pending continued improvement   Medical Consultants:    None.     Subjective:   Story/train of though difficult to follow   Objective:    Vitals:   07/02/18 0242 07/02/18 0749 07/02/18 0754 07/02/18 1356  BP:  99/86    Pulse: 80 88    Resp: (!) 28 19  (!) 22  Temp:  97.6 F (36.4 C)    TempSrc:  Oral    SpO2: 98% 90% 90% 91%  Weight:      Height:        Intake/Output Summary (Last 24 hours) at 07/02/2018 1453 Last data filed at 07/02/2018 1000 Gross per 24 hour  Intake 1761.85 ml  Output 0 ml  Net 1761.85 ml   Filed Weights   06/20/2018 0345  Weight: 108.9 kg    Exam: On side of bed, chronically ill appearing Crackles at bases of lungs L>R irr HR Healing wounds on abdomen S/p toe amputation on right foot  Data Reviewed:   I have personally  reviewed following labs and imaging studies:  Labs: Labs show the following:   Basic Metabolic Panel: Recent Labs  Lab 06/27/18 1403 06/25/2018 0354 07/02/18 0543  NA 137 135 135  K 4.0 4.8 5.3*  CL 102 101 103  CO2 24 19* 15*  GLUCOSE 140* 99 119*  BUN 24* 20 31*  CREATININE 1.26* 1.38* 1.85*  CALCIUM 7.2* 7.3* 7.4*   GFR Estimated Creatinine Clearance: 49.5 mL/min (A) (by C-G formula based on SCr of 1.85 mg/dL (H)). Liver Function Tests: Recent Labs  Lab 06/27/18 1403 06/28/2018 0354 06/21/2018  1340  AST 21 94* 141*  ALT 12 33 48*  ALKPHOS 85 104 105  BILITOT 1.0 1.7* 1.8*  PROT 6.8 6.1* 6.3*  ALBUMIN 3.2* 2.7* 2.7*   Recent Labs  Lab 06/27/18 1403  LIPASE 51   No results for input(s): AMMONIA in the last 168 hours. Coagulation profile No results for input(s): INR, PROTIME in the last 168 hours.  CBC: Recent Labs  Lab 06/27/18 1403 07/04/2018 0354 07/02/18 0543  WBC 6.3 5.9 10.6*  NEUTROABS  --  4.4  --   HGB 8.8* 8.7* 8.7*  HCT 30.0* 29.7* 30.1*  MCV 77.5* 75.8* 79.0*  PLT 195 166 157   Cardiac Enzymes: No results for input(s): CKTOTAL, CKMB, CKMBINDEX, TROPONINI in the last 168 hours. BNP (last 3 results) No results for input(s): PROBNP in the last 8760 hours. CBG: Recent Labs  Lab 07/03/2018 1201 07/06/2018 1639 06/26/2018 2112 07/02/18 0750 07/02/18 1151  GLUCAP 168* 192* 131* 110* 133*   D-Dimer: No results for input(s): DDIMER in the last 72 hours. Hgb A1c: No results for input(s): HGBA1C in the last 72 hours. Lipid Profile: No results for input(s): CHOL, HDL, LDLCALC, TRIG, CHOLHDL, LDLDIRECT in the last 72 hours. Thyroid function studies: No results for input(s): TSH, T4TOTAL, T3FREE, THYROIDAB in the last 72 hours.  Invalid input(s): FREET3 Anemia work up: No results for input(s): VITAMINB12, FOLATE, FERRITIN, TIBC, IRON, RETICCTPCT in the last 72 hours. Sepsis Labs: Recent Labs  Lab 06/27/18 1403 06/18/2018 0354  07/09/2018 1931  07/02/18 0141 07/02/18 0543 07/02/18 0814  WBC 6.3 5.9  --   --   --  10.6*  --   LATICACIDVEN  --   --    < > 6.8* 8.5* 5.5* 4.6*   < > = values in this interval not displayed.    Microbiology Recent Results (from the past 240 hour(s))  Blood Culture (routine x 2)     Status: None (Preliminary result)   Collection Time: 07/13/2018  4:03 AM  Result Value Ref Range Status   Specimen Description BLOOD RIGHT ARM  Final   Special Requests   Final    BOTTLES DRAWN AEROBIC AND ANAEROBIC Blood Culture adequate volume   Culture   Final    NO GROWTH 1 DAY Performed at [facility], [address], [place], [place] [address]    Report Status PENDING  Incomplete  Blood Culture (routine x 2)     Status: None (Preliminary result)   Collection Time: 06/19/2018  4:28 AM  Result Value Ref Range Status   Specimen Description BLOOD LEFT ANTECUBITAL  Final   Special Requests   Final    BOTTLES DRAWN AEROBIC AND ANAEROBIC Blood Culture adequate volume   Culture   Final    NO GROWTH 1 DAY Performed at [facility], [place] [address], [place],  [address]    Report Status PENDING  Incomplete    Procedures and diagnostic studies:  Dg Chest Port 1 View  Result Date: 07/02/2018 CLINICAL DATA:  Shortness of breath EXAM: PORTABLE CHEST 1 VIEW COMPARISON:  06/28/2018 FINDINGS: There is moderate cardiomegaly and findings of prior CABG and valvuloplasty, unchanged. There is no focal airspace consolidation. There is atelectasis at the left lung base. No pneumothorax. No pulmonary edema. IMPRESSION: Unchanged cardiomegaly without pulmonary edema. Left basilar atelectasis. Electronically Signed   By: Ulyses Jarred M.D.   On: 07/02/2018 01:13   Dg Chest Port 1 View  Result Date: 07/06/2018 CLINICAL DATA:  Shortness of breath EXAM: PORTABLE CHEST 1 VIEW COMPARISON:  06/27/2018 FINDINGS: Increased interstitial markings. Chronic bibasilar opacities. No focal consolidation or frank interstitial  edema. Small left pleural effusion. No pneumothorax. Chronic postsurgical changes at the left lung base related to left ventricular apical/aortic conduit. Cardiomegaly.  Postsurgical changes related to prior CABG. Median sternotomy. IMPRESSION: Stable postsurgical and chronic findings, as above. Electronically Signed   By: Julian Hy M.D.   On: 06/21/2018 04:05    Medications:   . apixaban  5 mg Oral BID  . atorvastatin  40 mg Oral q1800  . budesonide  0.5 mg Nebulization BID  . DULoxetine  20 mg Oral Daily  . famotidine  20 mg Oral BID  . feeding supplement (GLUCERNA SHAKE)  237 mL Oral TID BM  . insulin aspart  0-15 Units Subcutaneous TID WC  . ipratropium-albuterol  3 mL Nebulization Q6H  . metoprolol tartrate  25 mg Oral BID  . mometasone-formoterol  2 puff Inhalation BID  . oseltamivir  75 mg Oral BID  . pantoprazole  40 mg Oral BID  . polyethylene glycol  17 g Oral Daily  . sodium chloride flush  3 mL Intravenous Q12H  . sucralfate  1 g Oral QID   Continuous Infusions:   LOS: 0 days   Geradine Girt  [facility]   *Please refer to [email or web address], password [email or web address] to get updated schedule on who will round on this patient, as hospitalists switch teams weekly. If 7PM-7AM, please contact night-coverage at [email or web address], password [email or web address] for any overnight needs.  07/02/2018, 2:53 PM

## 2018-07-02 NOTE — Care Management Obs Status (Signed)
MEDICARE OBSERVATION STATUS NOTIFICATION   Patient Details  Name: Marcus Williams MRN: [id number] Date of Birth: Oct 27, 1955   Medicare Observation Status Notification Given:  Yes    Zenon Mayo, RN 07/02/2018, 12:15 PM

## 2018-07-03 ENCOUNTER — Inpatient Hospital Stay (HOSPITAL_COMMUNITY): Payer: Medicare HMO

## 2018-07-03 ENCOUNTER — Inpatient Hospital Stay (HOSPITAL_COMMUNITY): Payer: Medicare HMO | Admitting: Registered Nurse

## 2018-07-03 DIAGNOSIS — I5042 Chronic combined systolic (congestive) and diastolic (congestive) heart failure: Secondary | ICD-10-CM

## 2018-07-03 DIAGNOSIS — Z9989 Dependence on other enabling machines and devices: Secondary | ICD-10-CM

## 2018-07-03 DIAGNOSIS — N179 Acute kidney failure, unspecified: Secondary | ICD-10-CM

## 2018-07-03 DIAGNOSIS — J101 Influenza due to other identified influenza virus with other respiratory manifestations: Secondary | ICD-10-CM

## 2018-07-03 DIAGNOSIS — G4733 Obstructive sleep apnea (adult) (pediatric): Secondary | ICD-10-CM

## 2018-07-03 DIAGNOSIS — I10 Essential (primary) hypertension: Secondary | ICD-10-CM

## 2018-07-03 LAB — POCT I-STAT 3, ART BLOOD GAS (G3+)
Acid-base deficit: 14 mmol/L — ABNORMAL HIGH (ref 0.0–2.0)
Acid-base deficit: 12 mmol/L — ABNORMAL HIGH (ref 0.0–2.0)
Bicarbonate: 13.6 mmol/L — ABNORMAL LOW (ref 20.0–28.0)
Bicarbonate: 13.8 mmol/L — ABNORMAL LOW (ref 20.0–28.0)
O2 Saturation: 99 %
O2 Saturation: 97 %
PCO2 ART: 38.1 mmHg (ref 32.0–48.0)
Patient temperature: 98.6
TCO2: 15 mmol/L — ABNORMAL LOW (ref 22–32)
TCO2: 15 mmol/L — ABNORMAL LOW (ref 22–32)
pCO2 arterial: 29.7 mmHg — ABNORMAL LOW (ref 32.0–48.0)
pH, Arterial: 7.159 — CL (ref 7.350–7.450)
pH, Arterial: 7.271 — ABNORMAL LOW (ref 7.350–7.450)
pO2, Arterial: 152 mmHg — ABNORMAL HIGH (ref 83.0–108.0)
pO2, Arterial: 103 mmHg (ref 83.0–108.0)

## 2018-07-03 LAB — HEPATIC FUNCTION PANEL
ALT: 317 U/L — ABNORMAL HIGH (ref 0–44)
AST: 1203 U/L — ABNORMAL HIGH (ref 15–41)
Albumin: 3.1 g/dL — ABNORMAL LOW (ref 3.5–5.0)
Alkaline Phosphatase: 176 U/L — ABNORMAL HIGH (ref 38–126)
BILIRUBIN INDIRECT: 1.1 mg/dL — AB (ref 0.3–0.9)
Bilirubin, Direct: 1.7 mg/dL — ABNORMAL HIGH (ref 0.0–0.2)
Total Bilirubin: 2.8 mg/dL — ABNORMAL HIGH (ref 0.3–1.2)
Total Protein: 6.7 g/dL (ref 6.5–8.1)

## 2018-07-03 LAB — URINALYSIS, ROUTINE W REFLEX MICROSCOPIC
Bilirubin Urine: NEGATIVE
Glucose, UA: NEGATIVE mg/dL
Ketones, ur: NEGATIVE mg/dL
Leukocytes, UA: NEGATIVE
Nitrite: NEGATIVE
Protein, ur: 100 mg/dL — AB
Specific Gravity, Urine: 1.017 (ref 1.005–1.030)
pH: 5 (ref 5.0–8.0)

## 2018-07-03 LAB — CBC
HCT: 31.8 % — ABNORMAL LOW (ref 39.0–52.0)
Hemoglobin: 8.9 g/dL — ABNORMAL LOW (ref 13.0–17.0)
MCH: 21.8 pg — ABNORMAL LOW (ref 26.0–34.0)
MCHC: 28 g/dL — ABNORMAL LOW (ref 30.0–36.0)
MCV: 77.8 fL — ABNORMAL LOW (ref 80.0–100.0)
Platelets: 149 K/uL — ABNORMAL LOW (ref 150–400)
RBC: 4.09 MIL/uL — ABNORMAL LOW (ref 4.22–5.81)
RDW: 20.1 % — ABNORMAL HIGH (ref 11.5–15.5)
WBC: 12.5 K/uL — ABNORMAL HIGH (ref 4.0–10.5)
nRBC: 0.3 % — ABNORMAL HIGH (ref 0.0–0.2)

## 2018-07-03 LAB — GLUCOSE, CAPILLARY
GLUCOSE-CAPILLARY: 94 mg/dL (ref 70–99)
GLUCOSE-CAPILLARY: 61 mg/dL — AB (ref 70–99)
Glucose-Capillary: 127 mg/dL — ABNORMAL HIGH (ref 70–99)
Glucose-Capillary: 131 mg/dL — ABNORMAL HIGH (ref 70–99)
Glucose-Capillary: 154 mg/dL — ABNORMAL HIGH (ref 70–99)
Glucose-Capillary: 72 mg/dL (ref 70–99)

## 2018-07-03 LAB — PHOSPHORUS: Phosphorus: 5.7 mg/dL — ABNORMAL HIGH (ref 2.5–4.6)

## 2018-07-03 LAB — LACTIC ACID, PLASMA
Lactic Acid, Venous: 7.4 mmol/L (ref 0.5–1.9)
Lactic Acid, Venous: 7 mmol/L (ref 0.5–1.9)

## 2018-07-03 LAB — BASIC METABOLIC PANEL WITH GFR
Anion gap: 15 (ref 5–15)
BUN: 43 mg/dL — ABNORMAL HIGH (ref 8–23)
CO2: 17 mmol/L — ABNORMAL LOW (ref 22–32)
Calcium: 7.6 mg/dL — ABNORMAL LOW (ref 8.9–10.3)
Chloride: 102 mmol/L (ref 98–111)
Creatinine, Ser: 1.97 mg/dL — ABNORMAL HIGH (ref 0.61–1.24)
GFR calc Af Amer: 41 mL/min — ABNORMAL LOW (ref >60)
GFR calc non Af Amer: 35 mL/min — ABNORMAL LOW (ref >60)
Glucose, Bld: 120 mg/dL — ABNORMAL HIGH (ref 70–99)
Potassium: 5.3 mmol/L — ABNORMAL HIGH (ref 3.5–5.1)
Sodium: 134 mmol/L — ABNORMAL LOW (ref 135–145)

## 2018-07-03 LAB — MAGNESIUM: Magnesium: 2 mg/dL (ref 1.7–2.4)

## 2018-07-03 LAB — MRSA PCR SCREENING: MRSA by PCR: NEGATIVE

## 2018-07-03 LAB — TROPONIN I: Troponin I: 0.72 ng/mL (ref <0.03)

## 2018-07-03 MED ORDER — ALBUMIN HUMAN 25 % IV SOLN
12.5000 g | Freq: Once | INTRAVENOUS | Status: AC
  Administered 2018-07-03: 12.5 g via INTRAVENOUS
  Filled 2018-07-03: qty 50

## 2018-07-03 MED ORDER — CHLORHEXIDINE GLUCONATE CLOTH 2 % EX PADS
6.0000 | MEDICATED_PAD | Freq: Every day | CUTANEOUS | Status: DC
  Administered 2018-07-03 – 2018-07-06 (×4): 6 via TOPICAL

## 2018-07-03 MED ORDER — MIDAZOLAM HCL 2 MG/2ML IJ SOLN
2.0000 mg | INTRAMUSCULAR | Status: AC | PRN
  Administered 2018-07-03 – 2018-07-04 (×3): 2 mg via INTRAVENOUS
  Filled 2018-07-03 (×4): qty 2

## 2018-07-03 MED ORDER — FENTANYL CITRATE (PF) 100 MCG/2ML IJ SOLN
50.0000 ug | Freq: Once | INTRAMUSCULAR | Status: AC
  Administered 2018-07-03: 50 ug via INTRAVENOUS

## 2018-07-03 MED ORDER — SODIUM BICARBONATE 8.4 % IV SOLN
INTRAVENOUS | Status: DC
  Administered 2018-07-03 – 2018-07-04 (×2): via INTRAVENOUS
  Filled 2018-07-03 (×2): qty 150

## 2018-07-03 MED ORDER — DOCUSATE SODIUM 50 MG/5ML PO LIQD: 100.0000 mg | Freq: Two times a day (BID) | ORAL | Status: DC | PRN

## 2018-07-03 MED ORDER — MIDAZOLAM HCL 2 MG/2ML IJ SOLN
2.0000 mg | INTRAMUSCULAR | Status: DC | PRN
  Administered 2018-07-04 (×3): 2 mg via INTRAVENOUS
  Filled 2018-07-03 (×3): qty 2

## 2018-07-03 MED ORDER — SODIUM CHLORIDE 0.9% FLUSH: 10.0000 mL | INTRAVENOUS | Status: DC | PRN

## 2018-07-03 MED ORDER — NOREPINEPHRINE-SODIUM CHLORIDE 4-0.9 MG/250ML-% IV SOLN
INTRAVENOUS | Status: AC
  Filled 2018-07-03: qty 250

## 2018-07-03 MED ORDER — ORAL CARE MOUTH RINSE
15.0000 mL | OROMUCOSAL | Status: DC
  Administered 2018-07-03 – 2018-07-07 (×36): 15 mL via OROMUCOSAL

## 2018-07-03 MED ORDER — FENTANYL 2500MCG IN NS 250ML (10MCG/ML) PREMIX INFUSION
25.0000 ug/h | INTRAVENOUS | Status: DC
  Administered 2018-07-03: 100 ug/h via INTRAVENOUS
  Administered 2018-07-04 (×2): 200 ug/h via INTRAVENOUS
  Administered 2018-07-05: 250 ug/h via INTRAVENOUS
  Administered 2018-07-05: 25 ug/h via INTRAVENOUS
  Administered 2018-07-05 – 2018-07-06 (×2): 250 ug/h via INTRAVENOUS
  Filled 2018-07-03 (×7): qty 250

## 2018-07-03 MED ORDER — SODIUM BICARBONATE 8.4 % IV SOLN
INTRAVENOUS | Status: AC
  Filled 2018-07-03: qty 100

## 2018-07-03 MED ORDER — FENTANYL BOLUS VIA INFUSION
50.0000 ug | INTRAVENOUS | Status: DC | PRN
  Administered 2018-07-04 (×2): 50 ug via INTRAVENOUS
  Filled 2018-07-03: qty 50

## 2018-07-03 MED ORDER — NOREPINEPHRINE 16 MG/250ML-% IV SOLN
0.0000 ug/min | INTRAVENOUS | Status: DC
  Administered 2018-07-03: 15 ug/min via INTRAVENOUS
  Administered 2018-07-04: 20 ug/min via INTRAVENOUS
  Administered 2018-07-04: 30 ug/min via INTRAVENOUS
  Administered 2018-07-05: 8 ug/min via INTRAVENOUS
  Filled 2018-07-03 (×5): qty 250

## 2018-07-03 MED ORDER — FENTANYL CITRATE (PF) 100 MCG/2ML IJ SOLN
100.0000 ug | INTRAMUSCULAR | Status: DC | PRN
  Filled 2018-07-03: qty 2

## 2018-07-03 MED ORDER — SODIUM CHLORIDE 0.9 % IV SOLN: INTRAVENOUS | Status: DC

## 2018-07-03 MED ORDER — FUROSEMIDE 10 MG/ML IJ SOLN
60.0000 mg | Freq: Two times a day (BID) | INTRAMUSCULAR | Status: DC
  Administered 2018-07-03: 60 mg via INTRAVENOUS
  Filled 2018-07-03: qty 6

## 2018-07-03 MED ORDER — DEXTROSE 50 % IV SOLN
INTRAVENOUS | Status: AC
  Administered 2018-07-03: 25 mL
  Filled 2018-07-03: qty 50

## 2018-07-03 MED ORDER — NOREPINEPHRINE-SODIUM CHLORIDE 4-0.9 MG/250ML-% IV SOLN
0.0000 ug/min | INTRAVENOUS | Status: DC
  Administered 2018-07-03: 5 ug/min via INTRAVENOUS

## 2018-07-03 MED ORDER — FENTANYL CITRATE (PF) 100 MCG/2ML IJ SOLN
100.0000 ug | INTRAMUSCULAR | Status: DC | PRN
  Administered 2018-07-03: 100 ug via INTRAVENOUS

## 2018-07-03 MED ORDER — SODIUM CHLORIDE 0.9% FLUSH
10.0000 mL | Freq: Two times a day (BID) | INTRAVENOUS | Status: DC
  Administered 2018-07-03 – 2018-07-05 (×5): 10 mL

## 2018-07-03 MED ORDER — IPRATROPIUM-ALBUTEROL 0.5-2.5 (3) MG/3ML IN SOLN
3.0000 mL | Freq: Three times a day (TID) | RESPIRATORY_TRACT | Status: DC
  Administered 2018-07-03 – 2018-07-06 (×11): 3 mL via RESPIRATORY_TRACT
  Filled 2018-07-03 (×12): qty 3

## 2018-07-03 MED ORDER — CHLORHEXIDINE GLUCONATE 0.12% ORAL RINSE (MEDLINE KIT)
15.0000 mL | Freq: Two times a day (BID) | OROMUCOSAL | Status: DC
  Administered 2018-07-03 – 2018-07-06 (×8): 15 mL via OROMUCOSAL

## 2018-07-03 NOTE — Code Documentation (Signed)
  Patient Name: Marcus Williams   MRN: [id number]   Date of Birth/ Sex: August 04, 1955 , male      Admission Date: 07/02/2018  Attending Provider: Kerney Elbe, DO  Primary Diagnosis: Influenza B   Indication: Pt was in his usual state of health until this PM, when he was noted to be unresponsive in his chair. Code blue was subsequently called. At the time of arrival on scene, ACLS protocol was underway.   Technical Description:  - CPR performance duration:  12 minutes  - Was defibrillation or cardioversion used? No   - Was external pacer placed? No  - Was patient intubated pre/post CPR? Yes   Medications Administered: Y = Yes; Blank = No Amiodarone    Atropine    Calcium    Epinephrine  Y  Lidocaine    Magnesium    Norepinephrine    Phenylephrine    Sodium bicarbonate  Y  Vasopressin     Post CPR evaluation:  - Final Status - Was patient successfully resuscitated ? Yes - What is current rhythm? Sinus tach - What is current hemodynamic status? 131/101  Miscellaneous Information:  - Labs sent, including: None  - Primary team notified?  Yes  - Family Notified? Yes  - Additional notes/ transfer status:  Patient was found to be bradycardic while sitting in the chair in his room and then went into PEA arrest. He was moved from the chair into the bed and CPR was initiated. When the IMTS team entered the room, the defibrillator pads had not been placed on his chest and we were unable to assess his rhythm. The pads were eventually placed. He had no IV access initially, but eventually a peripheral IV was placed. He was also intubated during the code. He was found to have ROSC after 12 minutes of CPR and at that time was in sinus tachycardia. He was transferred to 2 Heart.     Dorrell, Andree Elk, MD  07/03/2018, 1:54 PM

## 2018-07-03 NOTE — Progress Notes (Signed)
Hypoglycemic Event  CBG: 61 @ 16:34  Treatment: D50 25 mL (12.5 gm)  Symptoms: None  Follow-up CBG: Time: 1731 CBG Result: 130  Possible Reasons for Event: Unknown  Comments/MD notified:     Deberah Castle

## 2018-07-03 NOTE — Procedures (Signed)
Central Venous Catheter Insertion Procedure Note Marcus Williams [id number] 03-16-1956  Procedure: Insertion of Central Venous Catheter Indications: Drug and/or fluid administration  Procedure Details Consent: Unable to obtain consent because of emergent medical necessity. Time Out: Verified patient identification, verified procedure, site/side was marked, verified correct patient position, special equipment/implants available, medications/allergies/relevent history reviewed, required imaging and test results available.  Performed  Maximum sterile technique was used including antiseptics, cap, gloves, gown, hand hygiene, mask and sheet. Skin prep: Chlorhexidine; local anesthetic administered A antimicrobial bonded/coated 7.50F 20cm triple lumen catheter was placed in the left subclavian vein using the Seldinger technique to an insertion depth of 15cm under ultrasound guidance. Line sutured in place and Biopatch and occlusive dressing applied. Single pass.      Evaluation Blood flow good Complications: No apparent complications Patient did tolerate procedure well. Chest X-ray ordered to verify placement.  CXR: normal.  Morganne Haile 07/03/2018, 3:38 PM

## 2018-07-03 NOTE — Anesthesia Procedure Notes (Signed)
Date/Time: 07/03/2018 1:40 PM Performed by: Jearld Pies, CRNA Pre-anesthesia Checklist: Patient identified, Emergency Drugs available, Suction available and Patient being monitored Oxygen Delivery Method: Ambu bag Preoxygenation: Pre-oxygenation with 100% oxygen Laryngoscope Size: Mac and 3 Grade View: Grade I Tube size: 7.5 mm Number of attempts: 1 Placement Confirmation: ETT inserted through vocal cords under direct vision,  CO2 detector and breath sounds checked- equal and bilateral Secured at: 23 cm Tube secured with: Tape Dental Injury: Teeth and Oropharynx as per pre-operative assessment  Comments: Responded to Code Blue alert; intubation while CPR was being performed, G1v, ETT passed through vocal cords without difficulty, positive chest rise, bilateral rhonchi noted upon auscultation, CO2 detector utilized with positive change in color, bloody secretions noted post Ambu ventilation by RT at bedside, Smith Robert MD informed of out of OR intubation.

## 2018-07-03 NOTE — Progress Notes (Signed)
I called the patient's wife, Dayne Chait, to inform her of the acute changes in the patient's clinical status. She states that she has not been by to see him because she has bilateral nephrostomy tubes and was told to stay away to prevent from getting sick. She does states that she and her husband have discussed what measures they would want in the situation of a code. She states that he would want everything done. She will be coming to the hospital.

## 2018-07-03 NOTE — Progress Notes (Signed)
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  Found to have influenza B and started on Tamiflu.  He was given volume resuscitation however is felt that he is volume overloaded so this was discontinued and IV Lasix was started.  Cardiology was consulted for further evaluation and recommendations for suspected CHF exacerbation and after cardiology evaluated and patient.  I evaluated the patient this AM and called Cardiology for further evaluation. In the afternoon the patient was found unresponsive sitting in his chair and a code was called.  I was never informed of the code and was not paged but the internal medicine teaching service responded and he underwent CPR resuscitation and was ROSC after 12 minutes.  Patient was transferred to the ICU for retrieving ROSC.  Patient was intubated and had a central line placed and was placed on pressors.  Cardiology will be following and I discussed the case with Dr. Toribio Harbour required of pulmonary and Dr. Lynetta Mare will become attending now.  Assessment & Plan:   Principal Problem:   Influenza B Active Problems:   HTN (hypertension)   Type II diabetes mellitus (HCC)   COPD (chronic  obstructive pulmonary disease) (HCC)   OSA on CPAP   A-fib (HCC)   Pressure injury of skin   Chronic combined systolic (congestive) and diastolic (congestive) heart failure (HCC)  Influenza B with no Vent dependent respiratory failure -Supportive care -Tamiflu BID with stop Date -D/C'd IVF as it appears he is moving more towards volume overload -He was on BiPAP when I evaluated him this morning -Chest x-ray showed airspace consolidation concerning for pneumonia of the left lower lobe and lungs were clear elsewhere.  The cardiac silhouette was stable in the valve replacement usually peripherally located on the left as noted in the lateral view to be within the surgically created aortic conduit. -Is unclear if this is a secondary bacterial infection noted on x-ray but will now defer to pulmonary to initiate broad-spectrum antibiotics.  After my discussion with Dr. Lake Bells patient will likely need a bronchoscopy  -Further Care per PCCM  AKI  -strict I/Os the weights -not sure he is emptying his bladder-- ? Blockage -PRN bladder scan -Cr 1 month ago was 1.13 and now significantly worsened BUN/creatinine is now 43/1.97 -Likely in the setting of cardiorenal syndrome as he did appear very volume overloaded on my examination this morning -during last admission at [facility] had a component of cardiorenal syndrome and was seen by renal that time and may consider obtaining a renal evaluation if continues to trend upwards  Obesity -Estimated body mass index is 38.02 kg/m as calculated from the following:   Height as of this encounter: 5\' 10"  (1.778 m).   Weight as of this encounter: 120.2 kg. -weight down 30lbs from December -  Weight Loss Counseling given  Lactic acidosis -? From acute illness vs metformin use I feel is from hypoperfusion in the setting of volume overload from CHF -Lactic acid at 8.5 and then trended down to 4.6 but then on repeated at this morning it was 7.4 and trended back down  to 7.0 -Did not administer IV fluids as he appeared severely volume overloaded and had 2-3+ lower extremity edema -Initiated diuresis with IV Lasix and cardiology was in agreement but patient subsequently coded and now we will critical care team service -We will continue monitor and he is now on pressors  Acute on chronic combined CHF -Echo 12/4 with EF 45-50% -BP on lower side so demadex on hold but have initiated IV Lasix and cardiology was in agreement -Strict I's and O's, daily weights, fluid restriction -We will consider repeating echocardiogram -BNP on admission was 1735 -Cardiology consulted for further evaluation recommendations  DM -RecentA1c of 6.6 in 12/19 demonstrates reasonable control -To need to hold Glucophage -Cover with moderate-scale SSI -BG's have been ranging from 72-133  COPD -May have some mild COPD component, although this does not appear to be an exacerbation at this time -Continue home Pulmicort, Advair, and standing Duonebs (increased to q6h) -Add prn Albuterol -Pulmonary is now taken over so will defer to them for further treatment  HTN -Continued Lopressor but patient had Bradycardia and PEA arrest -Further Care per Cardiology and PCCM  Afib on [facility] -On Eliquis  -Poor rate control on arrival but this has improved -Continue to monitor on telemetry for now -Continue Lopressor  OSA on CPAP -Continue CPAP  Pressure injury of skin -Patient with prior h/o sacral pressure ulcer  -Also with h/o diabetic foot ulcer, followed by podiatry -Also with left chest wounds with minimal drainage, no obvious purulence  CAD -s/p stents, CABG -On Plavix, Lipitor -Cardiology Consulted for further evaluation and recommendations  Depression/Anxiety -Continue Cymbalta  Shock Liver/Abnormal LFTs -The setting of hypoperfusion from cardiorenal syndrome as well as volume overload from CHF -AST went from 1 41-12 103 and ALT went from 42 through  17 -Continue monitor and trend LFTs -Repeat CMP in a.m.  Hyperbilirubinemia -Patient T bili went from one-point is now two-point -Likely reactive  -Continue to monitor  Microcytic Anemia -Patient's hemoglobin/hematocrit went from 8.7/30.1 is now 8.9/31.8 -Monitor for signs and symptoms of bleeding as patient is anticoagulated with apixaban -Repeat CBC in a.m.  DVT prophylaxis: Anticoagulated with Apixaban  Code Status: FULL CODE Family Communication: No family present at bedside Disposition Plan: Pending Clinical Improvement. Was transferred to ICU after he coded   Consultants:   Cardiology  PCCM -> Transferred   Procedures: Is intubated and then transferred to the ICU and will be having a central line placed   Antimicrobials:  Anti-infectives (From admission, onward)   Start     Dose/Rate Route Frequency Ordered Stop   07/02/18 0700  vancomycin (VANCOCIN) 1,250 mg in sodium chloride 0.9 % 250 mL IVPB  Status:  Discontinued     1,250 mg 166.7 mL/hr over 90 Minutes Intravenous Every 24 hours 06/30/2018 0614 06/30/2018 0916   06/30/2018 1800  ceFEPIme (MAXIPIME) 2 g in sodium chloride 0.9 % 100 mL IVPB  Status:  Discontinued     2 g 200 mL/hr over 30 Minutes Intravenous Every 12 hours 07/02/2018 0555 07/14/2018 0916   07/16/2018 1800  oseltamivir (TAMIFLU) capsule 75 mg     75 mg Oral 2 times daily 06/16/2018 0916 07/06/18 2159   06/27/2018 0530  oseltamivir (TAMIFLU) capsule 75 mg     75 mg Oral  Once 07/05/2018 0525 07/11/2018 0628   06/20/2018 0430  vancomycin (VANCOCIN) 2,000 mg in sodium chloride 0.9 % 500 mL IVPB     2,000 mg 250 mL/hr over 120 Minutes Intravenous  Once 07/08/2018 0416 06/23/2018 0746   06/24/2018 0415  ceFEPIme (MAXIPIME) 2 g in sodium chloride 0.9 % 100 mL IVPB     2 g 200 mL/hr over 30 Minutes Intravenous  Once 07/06/2018 0413 07/06/2018 0515   07/03/2018 0415  metroNIDAZOLE (FLAGYL) IVPB 500 mg  Status:  Discontinued     500 mg 100 mL/hr over 60 Minutes Intravenous Every 8  hours 06/18/2018 0413 06/28/2018 0916   07/02/2018 0415  vancomycin (VANCOCIN) IVPB 1000 mg/200 mL premix  Status:  Discontinued     1,000 mg 200 mL/hr over 60 Minutes Intravenous  Once 06/25/2018 0413 07/05/2018 0416     Subjective: Seen and examined prior to the patient coding and he was sitting in the chair bedside on his BiPAP and states he is doing okay but states that his shortness of breath was still bad and that his legs were extremely swollen.  No nausea or vomiting.  Was wanting something to eat.  Objective: Vitals:   07/03/18 0500 07/03/18 0746 07/03/18 0854 07/03/18 1407  BP:  127/82    Pulse:  83 86   Resp:  (!) 22 (!) 36   Temp:      TempSrc:      SpO2:  100% 100% 100%  Weight: 120.2 kg     Height:    5\' 10"  (1.778 m)    Intake/Output Summary (Last 24 hours) at 07/03/2018 1432 Last data filed at 07/03/2018 0540 Gross per 24 hour  Intake 680 ml  Output 600 ml  Net 80 ml   Filed Weights   06/24/2018 0345 07/03/18 0500  Weight: 108.9 kg 120.2 kg   Examination: Physical Exam Prior to Arrest   Constitutional: WN/WD obese Caucasian male NAD and appears calm but uncomfortable on BiPAP  and appears older than stated age Eyes: Lids and conjunctivae normal, sclerae anicteric  ENMT: External Ears, Nose appear normal. Grossly normal hearing. Mucous membranes are moist. Neck: Appears normal, supple, no cervical masses, normal ROM, no appreciable thyromegaly; mild JVD Respiratory: Diminished to auscultation bilaterally with coarse breath sounds; no wheezing, rales, rhonchi or crackles. Increased Respiratory Effort on BiPAP  Cardiovascular: RRR, Has a 3/6 murmur. S1 and S2 auscultated. 2+-3+ LE extremity edema.  Abdomen: Soft, non-tender, Distended to body habitus. No masses palpated. No appreciable hepatosplenomegaly. Bowel sounds positive x4.  GU: Deferred. Musculoskeletal: No clubbing / cyanosis of digits/nails. No joint deformity upper and lower extremities.  Skin:  Has a few ulcers  on limited skin evaluation. No induration; Warm and dry.  Neurologic: CN 2-12 grossly intact with no focal deficits.  Romberg sign and cerebellar reflexes not assessed.  Psychiatric: Normal judgment and insight. Alert and oriented x 3. Slightly anxious mood and appropriate affect.   Data Reviewed: I have personally reviewed following labs and imaging studies  CBC: Recent Labs  Lab 06/27/18 1403 06/30/2018 0354 07/02/18 0543 07/03/18 0259  WBC 6.3 5.9 10.6* 12.5*  NEUTROABS  --  4.4  --   --   HGB 8.8* 8.7* 8.7* 8.9*  HCT 30.0* 29.7* 30.1* 31.8*  MCV 77.5* 75.8* 79.0* 77.8*  PLT 195 166 157 267*   Basic Metabolic Panel: Recent Labs  Lab 06/27/18 1403 07/06/2018 0354 07/02/18  0543 07/03/18 0259 07/03/18 0804  NA 137 135 135 134*  --   K 4.0 4.8 5.3* 5.3*  --   CL 102 101 103 102  --   CO2 24 19* 15* 17*  --   GLUCOSE 140* 99 119* 120*  --   BUN 24* 20 31* 43*  --   CREATININE 1.26* 1.38* 1.85* 1.97*  --   CALCIUM 7.2* 7.3* 7.4* 7.6*  --   MG  --   --   --   --  2.0  PHOS  --   --   --   --  5.7*   GFR: Estimated Creatinine Clearance: 50.5 mL/min (A) (by C-G formula based on SCr of 1.97 mg/dL (H)). Liver Function Tests: Recent Labs  Lab 06/27/18 1403 06/29/2018 0354 07/16/2018 1340 07/03/18 0804  AST 21 94* 141* 1,203*  ALT 12 33 48* 317*  ALKPHOS 85 104 105 176*  BILITOT 1.0 1.7* 1.8* 2.8*  PROT 6.8 6.1* 6.3* 6.7  ALBUMIN 3.2* 2.7* 2.7* 3.1*   Recent Labs  Lab 06/27/18 1403  LIPASE 51   No results for input(s): AMMONIA in the last 168 hours. Coagulation Profile: No results for input(s): INR, PROTIME in the last 168 hours. Cardiac Enzymes: No results for input(s): CKTOTAL, CKMB, CKMBINDEX, TROPONINI in the last 168 hours. BNP (last 3 results) No results for input(s): PROBNP in the last 8760 hours. HbA1C: No results for input(s): HGBA1C in the last 72 hours. CBG: Recent Labs  Lab 07/02/18 1151 07/02/18 1713 07/02/18 2140 07/03/18 0747 07/03/18 1203    GLUCAP 133* 106* 94 94 72   Lipid Profile: No results for input(s): CHOL, HDL, LDLCALC, TRIG, CHOLHDL, LDLDIRECT in the last 72 hours. Thyroid Function Tests: No results for input(s): TSH, T4TOTAL, FREET4, T3FREE, THYROIDAB in the last 72 hours. Anemia Panel: No results for input(s): VITAMINB12, FOLATE, FERRITIN, TIBC, IRON, RETICCTPCT in the last 72 hours. Sepsis Labs: Recent Labs  Lab 07/02/18 0543 07/02/18 0814 07/03/18 0804 07/03/18 1013  LATICACIDVEN 5.5* 4.6* 7.4* 7.0*    Recent Results (from the past 240 hour(s))  Blood Culture (routine x 2)     Status: None (Preliminary result)   Collection Time: 07/13/2018  4:03 AM  Result Value Ref Range Status   Specimen Description BLOOD RIGHT ARM  Final   Special Requests   Final    BOTTLES DRAWN AEROBIC AND ANAEROBIC Blood Culture adequate volume   Culture   Final    NO GROWTH 1 DAY Performed at [facility], [place] [address], [place], [place] [address]    Report Status PENDING  Incomplete  Blood Culture (routine x 2)     Status: None (Preliminary result)   Collection Time: 07/03/2018  4:28 AM  Result Value Ref Range Status   Specimen Description BLOOD LEFT ANTECUBITAL  Final   Special Requests   Final    BOTTLES DRAWN AEROBIC AND ANAEROBIC Blood Culture adequate volume   Culture   Final    NO GROWTH 1 DAY Performed at [facility], [place] [address], [place], [place] [address]    Report Status PENDING  Incomplete     RN Pressure Injury Documentation: Pressure Injury 05/19/18 Stage II -  Partial thickness loss of dermis presenting as a shallow open ulcer with a red, pink wound bed without slough. (Active)  05/19/18 1919  Location: Coccyx  Location Orientation: Medial  Staging: Stage II -  Partial thickness loss of dermis presenting as a shallow  open ulcer with a red, pink wound bed without slough.  Wound Description (Comments):   Present on Admission: Yes   Radiology Studies: Dg Chest Port 1 View  Result Date:  07/03/2018 CLINICAL DATA:  Shortness of breath EXAM: PORTABLE CHEST 1 VIEW COMPARISON:  July 02, 2018 FINDINGS: There is airspace consolidation in the left lower lobe region. Right lung is clear. There is cardiomegaly with pulmonary vascularity within normal limits. Patient is status post coronary artery bypass grafting. The peripherally placed valve replacement is within a surgically created aortic conduit, better appreciated on lateral view from several days prior. There is aortic atherosclerosis. No adenopathy evident. No bone lesions. IMPRESSION: Airspace consolidation concerning for pneumonia left lower lobe. Lungs elsewhere clear. Stable cardiac silhouette. Valve replacement unusually peripherally located on the left is noted on lateral view to be within a surgically created aortic conduit. Valve position is stable. There is aortic atherosclerosis. Aortic Atherosclerosis (ICD10-I70.0). Electronically Signed   By: Lowella Grip III M.D.   On: 07/03/2018 09:22   Dg Chest Port 1 View  Result Date: 07/02/2018 CLINICAL DATA:  Shortness of breath EXAM: PORTABLE CHEST 1 VIEW COMPARISON:  07/15/2018 FINDINGS: There is moderate cardiomegaly and findings of prior CABG and valvuloplasty, unchanged. There is no focal airspace consolidation. There is atelectasis at the left lung base. No pneumothorax. No pulmonary edema. IMPRESSION: Unchanged cardiomegaly without pulmonary edema. Left basilar atelectasis. Electronically Signed   By: Ulyses Jarred M.D.   On: 07/02/2018 01:13   Scheduled Meds: . apixaban  5 mg Oral BID  . atorvastatin  40 mg Oral q1800  . budesonide  0.5 mg Nebulization BID  . DULoxetine  20 mg Oral Daily  . famotidine  20 mg Oral BID  . feeding supplement (GLUCERNA SHAKE)  237 mL Oral TID BM  . fentaNYL (SUBLIMAZE) injection  50 mcg Intravenous Once  . furosemide  60 mg Intravenous BID  . insulin aspart  0-15 Units Subcutaneous TID WC  . ipratropium-albuterol  3 mL Nebulization TID    . metoprolol tartrate  25 mg Oral BID  . mometasone-formoterol  2 puff Inhalation BID  . norepinephrine      . oseltamivir  75 mg Oral BID  . pantoprazole  40 mg Oral BID  . polyethylene glycol  17 g Oral Daily  . sodium bicarbonate      . sodium chloride flush  3 mL Intravenous Q12H  . sucralfate  1 g Oral QID   Continuous Infusions: . norepinephrine (LEVOPHED) Adult infusion      LOS: 1 day   Kerney Elbe, DO [facility] PAGER is on [place]  If 7PM-7AM, please contact night-coverage [email or web address] Password [facility] 07/03/2018, 2:32 PM

## 2018-07-03 NOTE — Progress Notes (Signed)
Ok to add 5d stop date for Tamiflu per Dr. Cannon Kettle, PharmD, BCIDP, AAHIVP, CPP Infectious Disease Pharmacist 07/03/2018 2:20 PM

## 2018-07-03 NOTE — Consult Note (Addendum)
CONSULTATION NOTE   Patient Name: Marcus Williams Date of Encounter: 07/03/2018 Cardiologist: Teodoro Spray, MD  Chief Complaint   Shortness of breath  Patient Profile   63 yo male with history of SVT, stroke, OSA on CPAP, CAD s/p CABG x 4, and afib, presents with DOE found to have Influenza B - cardiology consulted for acute on chronic CHF.  HPI   Marcus Williams is a 63 y.o. male who is being seen today for the evaluation of CHF at the request of Dr. Lorin Mercy. This is a 63 yo male with history of SVT, stroke, OSA on CPAP, CAD s/p CABG x 4 (LIMA to LAD, SVG to PDA, SVG to OM and SVG to D2), mild aortic stenosis and afib, as well as history of COPD and recent acute on chronic systolic congestive heart failure. He is followed by Dr. Ubaldo Glassing in [place]. He was admitted at [facility] 3 months ago for a total of 6 weeks after ruling in for NSTEMI -found to have stenosis in the graft to RCA and had DES placement - procedure was complicated by hypotension and probably contrast dye anaphylaxis. He was transferred to [facility] and developed probable repeat contrast dye anaphylaxis despite premedication. cMRI was performed which suggested severe AS and EF 42%. He was not considered a TAVR candidate and ultimately he underwent an apico-aortic conduit (valved conduit from LV apex to descending thoracic aorta) - 23 mm Edwards Ispiris via left thoracotomy on 8/16. Afib with RVR developed post-operatively. After ~3G amiodarone load, the patient then had PEA arrest with polymorphic VT. He was started on lidocaine and then reloaded on po amiodarone. He has been on Eliquis for anticoagulation and converted to sinus rhythm after cardioversion.  He is now admitted for acute dyspnea and found to have Influenza B pneumonia, complicated by recent acute on chronic systolic CHF (EF 81-85% by echo on 05/2018 [facility]).  Felt to be dry on admission and given volume, but now concerns for volume overload. There is AKI. Diuretics are on hold.  He is better rate-controlled currently, but was back in afib on admission - remains on Eliquis.  PMHx   Past Medical History:  Diagnosis Date  . A-fib ([place])    on Pradaxa  . Aortic stenosis, moderate 11/2015  . Arthritis   . CAD (coronary artery disease)   . Chronic combined systolic (congestive) and diastolic (congestive) heart failure ([place])   . COPD (chronic obstructive pulmonary disease) (HCC)    2L home O2  . Diabetes mellitus without complication ([place])    INSULIN DEPENDENT  . GERD (gastroesophageal reflux disease)   . History of cardioversion   . Hypertension   . Influenza B 06/2018  . MI (myocardial infarction) ([place])   . OSA on CPAP   . Sepsis ([place])   . Stroke ([place])   . SVT (supraventricular tachycardia) (HCC)     Past Surgical History:  Procedure Laterality Date  . 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 BUNIONECTOMY    . CARDIAC CATHETERIZATION N/A 10/02/2015   Procedure: Coronary/Grafts Angiography;  Surgeon: Teodoro Spray, MD;  Location: [place] CV LAB;  Service: Cardiovascular;  Laterality: N/A;  . CARDIAC CATHETERIZATION N/A 11/21/2015   Procedure: Right and Left Heart Cath;  Surgeon: Sherren Mocha, MD;  Location: [place] CV [facility];  Service: Cardiovascular;  Laterality: N/A;  . CORONARY STENT  INTERVENTION N/A 07/30/2017   Procedure: CORONARY STENT INTERVENTION;  Surgeon: Isaias Cowman, MD;  Location: [place] [facility] LAB;  Service: Cardiovascular;  Laterality: N/A;  . CORONARY STENT INTERVENTION N/A 10/23/2017   Procedure: CORONARY STENT INTERVENTION;  Surgeon: Yolonda Kida, MD;  Location: [place] [facility];  Service: Cardiovascular;  Laterality: N/A;  . KNEE SURGERY    . LEFT HEART CATH AND CORONARY ANGIOGRAPHY N/A 10/30/2017   Procedure: LEFT HEART CATH AND CORONARY ANGIOGRAPHY;  Surgeon: Teodoro Spray, MD;  Location: [place] [facility];  Service: Cardiovascular;  Laterality: N/A;  . LEFT HEART CATH AND CORS/GRAFTS ANGIOGRAPHY N/A 10/22/2017   Procedure: LEFT HEART CATH AND CORS/GRAFTS ANGIOGRAPHY;  Surgeon: Corey Skains, MD;  Location: [place] [facility];  Service: Cardiovascular;  Laterality: N/A;  . RIGHT/LEFT HEART CATH AND CORONARY/GRAFT ANGIOGRAPHY N/A 07/30/2017   Procedure: RIGHT/LEFT HEART CATH AND CORONARY/GRAFT ANGIOGRAPHY;  Surgeon: Isaias Cowman, MD;  Location: [place] CV LAB;  Service: Cardiovascular;  Laterality: N/A;  . TEE WITHOUT CARDIOVERSION N/A 11/21/2015   Procedure: TRANSESOPHAGEAL ECHOCARDIOGRAM (TEE);  Surgeon: Larey Dresser, MD;  Location: [place];  Service: Cardiovascular;  Laterality: N/A;  . TRACHEOSTOMY    . TRACHEOSTOMY CLOSURE      FAMHx   Family History  Problem Relation Age of Onset  . Stroke Mother   . Heart attack Mother   . Hypertension Mother   . Heart attack Father   . Hypertension Father   . Heart attack Brother        #1  . Diabetes Brother        #1  . Heart disease Brother        #2  . Lung disease Brother        #2  . Hypertension Brother        #2  . Diabetes Brother        #2    SOCHx    reports that he has never smoked. He has never used smokeless tobacco. He reports that he does not drink alcohol or use drugs.  Outpatient Medications   No current facility-administered medications on file prior to encounter.    Current Outpatient Medications on File Prior to Encounter  Medication Sig Dispense Refill  . acetaminophen (TYLENOL) 325 MG tablet Take 2 tablets (650 mg total) by mouth every 6 (six) hours as needed for moderate pain (headache).    [place] Kitchen albuterol (PROVENTIL HFA;VENTOLIN HFA) 108 (90 Base) MCG/ACT inhaler Inhale 2 puffs into the lungs every 6 (six) hours as needed for wheezing or shortness of breath. 1 Inhaler 10  . apixaban (ELIQUIS) 5 MG TABS tablet Take 5 mg by mouth 2 (two) times daily.    Marland Kitchen atorvastatin  (LIPITOR) 40 MG tablet Take 1 tablet (40 mg total) by mouth daily at 6 PM. 30 tablet 0  . DULoxetine (CYMBALTA) 20 MG capsule Take 20 mg by mouth daily.    . famotidine (PEPCID) 20 MG tablet Take 1 tablet (20 mg total) by mouth 2 (two) times daily. 60 tablet 0  . Fluticasone-Salmeterol (ADVAIR) 250-50 MCG/DOSE AEPB Inhale 1 puff into the lungs 2 (two) times daily. 60 each 1  . guaiFENesin-codeine 100-10 MG/5ML syrup Take 5 mLs by mouth every 6 (six) hours as needed for cough.    [place] Kitchen ipratropium-albuterol (DUONEB) 0.5-2.5 (3) MG/3ML SOLN Take 3 mLs by nebulization 2 (two) times daily. 360 mL   . metFORMIN (GLUCOPHAGE) 1000 MG tablet Take 1,000 mg by  mouth 2 (two) times daily with a meal.    . metoprolol tartrate (LOPRESSOR) 25 MG tablet Take 1 tablet (25 mg total) by mouth 2 (two) times daily.    . ondansetron (ZOFRAN-ODT) 4 MG disintegrating tablet Take 4 mg by mouth every 8 (eight) hours as needed for nausea/vomiting.    . pantoprazole (PROTONIX) 40 MG tablet Take 1 tablet (40 mg total) by mouth 2 (two) times daily. (Patient taking differently: Take 40 mg by mouth daily. ) 60 tablet 2  . polyethylene glycol (MIRALAX / GLYCOLAX) packet Take 17 g by mouth daily. (Patient taking differently: Take 17 g by mouth daily as needed for mild constipation. ) 14 each 0  . Potassium Chloride ER 20 MEQ TBCR Take 10 mEq by mouth daily. (Patient taking differently: Take 20 mEq by mouth daily. ) 34 tablet 6  . pramipexole (MIRAPEX) 0.5 MG tablet Take 0.5 mg by mouth 3 (three) times daily as needed.     . senna (SENOKOT) 8.6 MG TABS tablet Take 1 tablet (8.6 mg total) by mouth daily as needed for mild constipation. 120 each 0  . sucralfate (CARAFATE) 1 g tablet Take 1 tablet (1 g total) by mouth 4 (four) times daily. 120 tablet 1  . torsemide (DEMADEX) 20 MG tablet Take 1 tablet (20 mg total) by mouth daily.    [place] Kitchen zolpidem (AMBIEN) 5 MG tablet Take 1 tablet (5 mg total) by mouth at bedtime as needed for sleep. 10 tablet  0  . budesonide (PULMICORT) 0.5 MG/2ML nebulizer solution Take 2 mLs (0.5 mg total) by nebulization 2 (two) times daily.  12  . clopidogrel (PLAVIX) 75 MG tablet Take 1 tablet (75 mg total) by mouth daily with breakfast. (Patient not taking: Reported on 05/19/2018) 30 tablet 0  . oxyCODONE (OXY IR/ROXICODONE) 5 MG immediate release tablet Take 1-2 tablets (5-10 mg total) by mouth every 6 (six) hours as needed for moderate pain, severe pain or breakthrough pain. (Patient not taking: Reported on 07/16/2018) 20 tablet 0  . sodium chloride (OCEAN) 0.65 % SOLN nasal spray Place 1 spray into both nostrils as needed for congestion. (Patient not taking: Reported on 06/14/2018)  0    Inpatient Medications    Scheduled Meds: . apixaban  5 mg Oral BID  . atorvastatin  40 mg Oral q1800  . budesonide  0.5 mg Nebulization BID  . DULoxetine  20 mg Oral Daily  . famotidine  20 mg Oral BID  . feeding supplement (GLUCERNA SHAKE)  237 mL Oral TID BM  . furosemide  60 mg Intravenous BID  . insulin aspart  0-15 Units Subcutaneous TID WC  . ipratropium-albuterol  3 mL Nebulization TID  . metoprolol tartrate  25 mg Oral BID  . mometasone-formoterol  2 puff Inhalation BID  . oseltamivir  75 mg Oral BID  . pantoprazole  40 mg Oral BID  . polyethylene glycol  17 g Oral Daily  . sodium chloride flush  3 mL Intravenous Q12H  . sucralfate  1 g Oral QID    Continuous Infusions:   PRN Meds: acetaminophen **OR** acetaminophen, albuterol, hydrOXYzine, ondansetron **OR** ondansetron (ZOFRAN) IV, oxyCODONE, senna, zolpidem   ALLERGIES   Allergies  Allergen Reactions  . Contrast Media [Iodinated Diagnostic Agents] Shortness Of Breath    Hypoxia/hypotension  . Versed [Midazolam] Shortness Of Breath and Other (See Comments)    Hypotension    ROS   Pertinent items noted in HPI and remainder of comprehensive ROS otherwise negative.  Vitals   Vitals:   07/03/18 0300 07/03/18 0500 07/03/18 0746 07/03/18 0854    BP:   127/82   Pulse:   83 86  Resp: (!) 22  (!) 22 (!) 36  Temp:      TempSrc:      SpO2:   100% 100%  Weight:  120.2 kg    Height:        Intake/Output Summary (Last 24 hours) at 07/03/2018 1205 Last data filed at 07/03/2018 0540 Gross per 24 hour  Intake 680 ml  Output 600 ml  Net 80 ml   Filed Weights   06/21/2018 0345 07/03/18 0500  Weight: 108.9 kg 120.2 kg    Physical Exam   General appearance: alert and no distress Neck: no carotid bruit, no JVD and thyroid not enlarged, symmetric, no tenderness/mass/nodules Lungs: diminished breath sounds bilaterally and wheezes bilaterally Heart: regular rate and rhythm Abdomen: soft, non-tender; bowel sounds normal; no masses,  no organomegaly and obese Extremities: extremities normal, atraumatic, no cyanosis or edema Pulses: 2+ and symmetric Skin: pale, warm, dry Neurologic: Grossly normal Psych: Pleasant  Labs   Results for orders placed or performed during the hospital encounter of 06/18/2018 (from the past 48 hour(s))  Hepatic function panel     Status: Abnormal   Collection Time: 07/12/2018  1:40 PM  Result Value Ref Range   Total Protein 6.3 (L) 6.5 - 8.1 g/dL   Albumin 2.7 (L) 3.5 - 5.0 g/dL   AST 141 (H) 15 - 41 U/L   ALT 48 (H) 0 - 44 U/L   Alkaline Phosphatase 105 38 - 126 U/L   Total Bilirubin 1.8 (H) 0.3 - 1.2 mg/dL   Bilirubin, Direct 0.7 (H) 0.0 - 0.2 mg/dL   Indirect Bilirubin 1.1 (H) 0.3 - 0.9 mg/dL    Comment: Performed at [facility], [address], [place], [place] [address]  Lactic acid, plasma     Status: Abnormal   Collection Time: 07/14/2018  1:40 PM  Result Value Ref Range   Lactic Acid, Venous 4.3 (HH) 0.5 - 1.9 mmol/L    Comment: CRITICAL RESULT CALLED TO, READ BACK BY AND VERIFIED WITH: R RICHARDS,RN AT 1524 06/23/2018 BY L BENFIELD Performed at [facility], [place] [address], [place], [place] [address]   Glucose, capillary     Status: Abnormal   Collection Time: 07/09/2018  4:39 PM   Result Value Ref Range   Glucose-Capillary 192 (H) 70 - 99 mg/dL   Comment 1 Notify RN    Comment 2 Document in Chart   Lactic acid, plasma     Status: Abnormal   Collection Time: 06/17/2018  7:31 PM  Result Value Ref Range   Lactic Acid, Venous 6.8 (HH) 0.5 - 1.9 mmol/L    Comment: CRITICAL RESULT CALLED TO, READ BACK BY AND VERIFIED WITH: Georgina Snell 2035 06/19/2018 WBOND Performed at [facility], [place] [address] [place], [place] [address]   Glucose, capillary     Status: Abnormal   Collection Time: 06/17/2018  9:12 PM  Result Value Ref Range   Glucose-Capillary 131 (H) 70 - 99 mg/dL  Lactic acid, plasma     Status: Abnormal   Collection Time: 07/02/18  1:41 AM  Result Value Ref Range   Lactic Acid, Venous 8.5 (HH) 0.5 - 1.9 mmol/L    Comment: CRITICAL RESULT CALLED TO, READ BACK BY AND VERIFIED WITH: Bobbye Charleston 07/02/18 0231 WAYK Performed at [facility], [address], [place], [place] [address]   Basic metabolic panel     Status: Abnormal   Collection Time: 07/02/18  5:43 AM  Result Value Ref Range   Sodium 135 135 - 145 mmol/L   Potassium 5.3 (H) 3.5 - 5.1 mmol/L   Chloride 103 98 - 111 mmol/L   CO2 15 (L) 22 - 32 mmol/L   Glucose, Bld 119 (H) 70 - 99 mg/dL   BUN 31 (H) 8 - 23 mg/dL   Creatinine, Ser 1.85 (H) 0.61 - 1.24 mg/dL   Calcium 7.4 (L) 8.9 - 10.3 mg/dL   GFR calc non Af Amer 38 (L) >60 mL/min   GFR calc Af Amer 44 (L) >60 mL/min   Anion gap 17 (H) 5 - 15    Comment: Performed at [facility] [address], [place], [place] [address]  CBC     Status: Abnormal   Collection Time: 07/02/18  5:43 AM  Result Value Ref Range   WBC 10.6 (H) 4.0 - 10.5 K/uL   RBC 3.81 (L) 4.22 - 5.81 MIL/uL   Hemoglobin 8.7 (L) 13.0 - 17.0 g/dL   HCT 30.1 (L) 39.0 - 52.0 %   MCV 79.0 (L) 80.0 - 100.0 fL   MCH 22.8 (L) 26.0 - 34.0 pg   MCHC 28.9 (L) 30.0 - 36.0 g/dL   RDW 20.5 (H) 11.5 - 15.5 %   Platelets 157 150 - 400 K/uL   nRBC 0.3 (H) 0.0 - 0.2 %     Comment: Performed at [facility], [place] [address] [place], [place] [address]  Lactic acid, plasma     Status: Abnormal   Collection Time: 07/02/18  5:43 AM  Result Value Ref Range   Lactic Acid, Venous 5.5 (HH) 0.5 - 1.9 mmol/L    Comment: CRITICAL RESULT CALLED TO, READ BACK BY AND VERIFIED WITH: Bobbye Charleston 07/02/18 3007 WAYK Performed at [facility], [place] [address], [place], [place] [address]   Glucose, capillary     Status: Abnormal   Collection Time: 07/02/18  7:50 AM  Result Value Ref Range   Glucose-Capillary 110 (H) 70 - 99 mg/dL  Lactic acid, plasma     Status: Abnormal   Collection Time: 07/02/18  8:14 AM  Result Value Ref Range   Lactic Acid, Venous 4.6 (HH) 0.5 - 1.9 mmol/L    Comment: CRITICAL RESULT CALLED TO, READ BACK BY AND VERIFIED WITH: KATIE PACE,RN AT 3354 07/02/2018 BY ZBEECH. Performed at [facility], [place] [address], [place], [place] [address]   Glucose, capillary     Status: Abnormal   Collection Time: 07/02/18 11:51 AM  Result Value Ref Range   Glucose-Capillary 133 (H) 70 - 99 mg/dL  Glucose, capillary     Status: Abnormal   Collection Time: 07/02/18  5:13 PM  Result Value Ref Range   Glucose-Capillary 106 (H) 70 - 99 mg/dL  Glucose, capillary     Status: None   Collection Time: 07/02/18  9:40 PM  Result Value Ref Range   Glucose-Capillary 94 70 - 99 mg/dL  CBC     Status: Abnormal   Collection Time: 07/03/18  2:59 AM  Result Value Ref Range   WBC 12.5 (H) 4.0 - 10.5 K/uL   RBC 4.09 (L) 4.22 - 5.81 MIL/uL   Hemoglobin 8.9 (L) 13.0 - 17.0 g/dL    Comment: Reticulocyte Hemoglobin testing may be clinically indicated, consider ordering this additional  test [id number]    HCT 31.8 (L) 39.0 - 52.0 %   MCV 77.8 (L) 80.0 - 100.0 fL   MCH 21.8 (L) 26.0 - 34.0 pg   MCHC 28.0 (L) 30.0 - 36.0 g/dL   RDW 20.1 (H) 11.5 - 15.5 %   Platelets 149 (L) 150 - 400 K/uL   nRBC 0.3 (H) 0.0 - 0.2 %    Comment: Performed at [facility] [address], [place], [place] [address]  Basic metabolic panel     Status: Abnormal   Collection Time: 07/03/18  2:59 AM  Result Value Ref Range   Sodium 134 (L) 135 - 145 mmol/L   Potassium 5.3 (H) 3.5 - 5.1 mmol/L   Chloride 102 98 - 111 mmol/L   CO2 17 (L) 22 - 32 mmol/L   Glucose, Bld 120 (H) 70 - 99 mg/dL   BUN 43 (H) 8 - 23 mg/dL   Creatinine, Ser 1.97 (H) 0.61 - 1.24 mg/dL   Calcium 7.6 (L) 8.9 - 10.3 mg/dL   GFR calc non Af Amer 35 (L) >60 mL/min   GFR calc Af Amer 41 (L) >60 mL/min   Anion gap 15 5 - 15    Comment: Performed at [facility] [address], [place], [place] [address]  Glucose, capillary     Status: None   Collection Time: 07/03/18  7:47 AM  Result Value Ref Range   Glucose-Capillary 94 70 - 99 mg/dL  Lactic acid, plasma     Status: Abnormal   Collection Time: 07/03/18  8:04 AM  Result Value Ref Range   Lactic Acid, Venous 7.4 (HH) 0.5 - 1.9 mmol/L    Comment: CRITICAL RESULT CALLED TO, READ BACK BY AND VERIFIED WITH: J.GANOE,RN @ 9417 07/03/2018 [place] Performed at [facility], [place] [address], [place], [place] [address]   Hepatic function panel     Status: Abnormal   Collection Time: 07/03/18  8:04 AM  Result Value Ref Range   Total Protein 6.7 6.5 - 8.1 g/dL   Albumin 3.1 (L) 3.5 - 5.0 g/dL   AST 1,203 (H) 15 - 41 U/L   ALT 317 (H) 0 - 44 U/L    Comment: RESULTS CONFIRMED BY MANUAL DILUTION   Alkaline Phosphatase 176 (H) 38 - 126 U/L   Total Bilirubin 2.8 (H) 0.3 - 1.2 mg/dL   Bilirubin, Direct 1.7 (H) 0.0 - 0.2 mg/dL   Indirect Bilirubin 1.1 (H) 0.3 - 0.9 mg/dL    Comment: Performed at [facility] [address] [place], [place] [address]  Magnesium     Status: None   Collection Time: 07/03/18  8:04 AM  Result Value Ref Range   Magnesium 2.0 1.7 - 2.4 mg/dL    Comment: Performed at [facility] [address], [place], [place] [address]  Phosphorus     Status: Abnormal   Collection Time: 07/03/18  8:04 AM    Result Value Ref Range   Phosphorus 5.7 (H) 2.5 - 4.6 mg/dL    Comment: Performed at [facility] [address] [place], [place] [address]  Lactic acid, plasma     Status: Abnormal   Collection Time: 07/03/18 10:13 AM  Result Value Ref Range   Lactic Acid, Venous 7.0 (HH) 0.5 - 1.9 mmol/L    Comment: CRITICAL RESULT CALLED TO, READ BACK BY AND VERIFIED WITH: J.GANOE,RN @ 1100 07/03/2018 [place] Performed at [facility], [address], [place], [place] [address]   Glucose, capillary     Status: None   Collection Time: 07/03/18 12:03 PM  Result Value Ref Range   Glucose-Capillary 72 70 - 99 mg/dL    ECG   N/A  Telemetry   Sinus rhythm in 90's - Personally Reviewed  Radiology   Dg Chest Port 1 View  Result Date: 07/03/2018 CLINICAL DATA:  Shortness of breath EXAM: PORTABLE CHEST 1 VIEW COMPARISON:  July 02, 2018 FINDINGS: There is airspace consolidation in the left lower lobe region. Right lung is clear. There is cardiomegaly with pulmonary vascularity within normal limits. Patient is status post coronary artery bypass grafting. The peripherally placed valve replacement is within a surgically created aortic conduit, better appreciated on lateral view from several days prior. There is aortic atherosclerosis. No adenopathy evident. No bone lesions. IMPRESSION: Airspace consolidation concerning for pneumonia left lower lobe. Lungs elsewhere clear. Stable cardiac silhouette. Valve replacement unusually peripherally located on the left is noted on lateral view to be within a surgically created aortic conduit. Valve position is stable. There is aortic atherosclerosis. Aortic Atherosclerosis (ICD10-I70.0). Electronically Signed   By: Lowella Grip III M.D.   On: 07/03/2018 09:22   Dg Chest Port 1 View  Result Date: 07/02/2018 CLINICAL DATA:  Shortness of breath EXAM: PORTABLE CHEST 1 VIEW COMPARISON:  06/17/2018 FINDINGS: There is moderate cardiomegaly and findings of  prior CABG and valvuloplasty, unchanged. There is no focal airspace consolidation. There is atelectasis at the left lung base. No pneumothorax. No pulmonary edema. IMPRESSION: Unchanged cardiomegaly without pulmonary edema. Left basilar atelectasis. Electronically Signed   By: Ulyses Jarred M.D.   On: 07/02/2018 01:13    Cardiac Studies   N/A  Impression   Principal Problem:   Influenza B Active Problems:   HTN (hypertension)   Type II diabetes mellitus (HCC)   COPD (chronic obstructive pulmonary disease) (HCC)   OSA on CPAP   A-fib (HCC)   Pressure injury of skin   Chronic combined systolic (congestive) and diastolic (congestive) heart failure (HCC)   Recommendation   1. Mr. Gallardo is primarily dyspneic secondary to Influenza B. I do not appreciate volume overload, however, it appears he has been written for additional lasix - his recent echo showed only mild reduction in LVEF. Fortunately, it appears he is in sinus rhythm - he is not currently on amiodarone (noted AST/ALT of 1203/317), but continues on Eliquis. Creatinine noted to be up overnight - he says he is breathing better. Is 2L positive recorded. Will monitor creatinine with IV lasix today - but may need to back off tomorrow.   Thanks for the interesting consultation -cardiology will follow.  Time Spent Directly with Patient:  I have spent a total of 75 minutes with the patient reviewing hospital notes, telemetry, EKGs, labs and examining the patient as well as establishing an assessment and plan that was discussed personally with the patient.  > 50% of time was spent in direct patient care.  Length of Stay:  LOS: 1 day   Pixie Casino, MD, [facility], [place] Director of the [facility] Lipid Disorders &  Cardiovascular Risk Reduction [facility] Diplomate of the American Board of Clinical Lipidology Attending Cardiologist  Direct Dial: [phone]  Fax: [phone]  Website:   [email or web address] Osgood Corrin Hingle 07/03/2018, 12:05 PM

## 2018-07-03 NOTE — Progress Notes (Signed)
CRITICAL VALUE ALERT  Critical Value:  Lactic acid 7.4  Date & Time Notied:  07/03/18 0915  Provider Notified: O. Sheikh   Orders Received/Actions taken: No fluids ordered at this time. Per callback, MD will consult heart & renal failure teams for potential diuresis.

## 2018-07-03 NOTE — Progress Notes (Signed)
Code blue called in 2w35.  Pt bagged with 100% O2 via ambu bag and mask.  CRNA intubated.  ETT secured.  Pt transferred to ICU.  RT will continue to monitor.

## 2018-07-03 NOTE — Consult Note (Signed)
NAME:  Marcus Williams, MRN:  [id number], DOB:  12/27/1955, LOS: 1 ADMISSION DATE:  06/25/2018, CONSULTATION DATE: 07/10/2018 REFERRING MD: [facility], CHIEF COMPLAINT: Status post cardiac arrest PEA  Brief History   07/10/18 post 12-minute cardiac arrest transfer to the intensive.  History of present illness   Marcus Williams is a 63 year old who appears much older than his stated age who has an extensive past medical history as well documented below that includes but is not exclusive to coronary artery disease, diabetes mellitus poorly controlled, obstructive sleep apnea on CPAP, hypertension, combined congestive heart failure, history of left ventricular apical aortic conduit and 01/29/2018 at [facility] at which time he had a cardiac arrest requiring cardiac massage and epinephrine while in the operating room. He presents to [facility] 07/11/2018 with complaints of fever and shortness of breath and heart rate of 150.  He is being treated on the floor and was found in the chair unresponsive in PEA arrest on 07/10/2018 and subsequently was coded at that time.  Required intubation and transferred to  intensive care unit where he is being mechanical ventilated and is in critical condition.  Past Medical History  Cardiac arrest Coronary artery disease Diabetes mellitus Poor compliance COPD OSA And obesity   Significant Hospital Events   2018/07/10 cardiac arrest  Consults:  Cardiology Pulmonary critical care  Procedures:  2018-07-10 intubation by anesthesia on the floor secondary to code 10-Jul-2018 right subclavian central line  Significant Diagnostic Tests:    Micro Data:    Antimicrobials:    Interim history/subjective:  2018/07/10 found in chair unresponsive PEA arrest coded for 12 minutes with CPR 1 round of epinephrine return of spontaneous percolation transferred to intensive care unit  Objective   Blood pressure 127/82, pulse 86, temperature 97.6 F (36.4 C), temperature  source Axillary, resp. rate (!) 36, height 5\' 10"  (1.778 m), weight 120.2 kg, SpO2 100 %.    Vent Mode: PRVC FiO2 (%):  [40 %-100 %] 100 % Set Rate:  [24 bmp] 24 bmp Vt Set:  [570 mL] 570 mL PEEP:  [5 cmH20] 5 cmH20   Intake/Output Summary (Last 24 hours) at 07/10/18 1432 Last data filed at 07-10-18 0540 Gross per 24 hour  Intake 680 ml  Output 600 ml  Net 80 ml   Filed Weights   07/04/2018 0345 2018/07/10 0500  Weight: 108.9 kg 120.2 kg    Examination: General: Morbidly obese male who status post cardiac arrest is intubated HENT: No JVD or lymphadenopathy is appreciated, endotracheal tube is in place Lungs: Decreased air movement Cardiovascular: Heart sounds are regular, old midline surgical scar noted sternum Abdomen: Obese soft Extremities: Venous stasis changes Neuro renal sedated on mechanical ventilatory support GU: Foley is been ordered  Resolved Hospital Problem list     Assessment & Plan:  Vent dependent respiratory failure secondary to PEA cardiac arrest on 2018-07-10 while on the floor. History of obstructive sleep apnea History of COPD Home O2 dependent Recent influenza be -Vent bundle -Bronchodilators -Wean vent as tolerated -Follow-up chest x-ray to confirm tube placement  Status post cardiac arrest 07/10/2018 1212 minutes of CPR return of spontaneous circulation after 12 minutes and 1 epinephrine History of coronary disease History of aortic left apical conduit on 01/29/2018 which resulted in a PEA arrest requiring cardiac massage and epinephrine Chronic atrial fibrillation Chronic combined systolic and diastolic heart failure History of hypertension -Cardiac consult -Vasopressor support as needed -Place central line -Mechanically ventilated -Currently full code  Diabetes mellitus -Sliding scale insulin protocol  Chronic ulcer secondary to poorly controlled diabetes -Wound care   Best practice:  Diet: N.p.o. Pain/Anxiety/Delirium protocol  (if indicated): Fentanyl Versed VAP protocol (if indicated): Yes DVT prophylaxis: Yes GI prophylaxis: PPI Glucose control: Sliding scale insulin Mobility: Bedrest Code Status: Full Family Communication: 07/03/2018 no family at bedside. Disposition: Intensive care unit to heart  Labs   CBC: Recent Labs  Lab 06/27/18 1403 06/30/2018 0354 07/02/18 0543 07/03/18 0259  WBC 6.3 5.9 10.6* 12.5*  NEUTROABS  --  4.4  --   --   HGB 8.8* 8.7* 8.7* 8.9*  HCT 30.0* 29.7* 30.1* 31.8*  MCV 77.5* 75.8* 79.0* 77.8*  PLT 195 166 157 149*    Basic Metabolic Panel: Recent Labs  Lab 06/27/18 1403 06/24/2018 0354 07/02/18 0543 07/03/18 0259 07/03/18 0804  NA 137 135 135 134*  --   K 4.0 4.8 5.3* 5.3*  --   CL 102 101 103 102  --   CO2 24 19* 15* 17*  --   GLUCOSE 140* 99 119* 120*  --   BUN 24* 20 31* 43*  --   CREATININE 1.26* 1.38* 1.85* 1.97*  --   CALCIUM 7.2* 7.3* 7.4* 7.6*  --   MG  --   --   --   --  2.0  PHOS  --   --   --   --  5.7*   GFR: Estimated Creatinine Clearance: 50.5 mL/min (A) (by C-G formula based on SCr of 1.97 mg/dL (H)). Recent Labs  Lab 06/27/18 1403 06/23/2018 0354  07/02/18 0543 07/02/18 0814 07/03/18 0259 07/03/18 0804 07/03/18 1013  WBC 6.3 5.9  --  10.6*  --  12.5*  --   --   LATICACIDVEN  --   --    < > 5.5* 4.6*  --  7.4* 7.0*   < > = values in this interval not displayed.    Liver Function Tests: Recent Labs  Lab 06/27/18 1403 06/20/2018 0354 07/02/2018 1340 07/03/18 0804  AST 21 94* 141* 1,203*  ALT 12 33 48* 317*  ALKPHOS 85 104 105 176*  BILITOT 1.0 1.7* 1.8* 2.8*  PROT 6.8 6.1* 6.3* 6.7  ALBUMIN 3.2* 2.7* 2.7* 3.1*   Recent Labs  Lab 06/27/18 1403  LIPASE 51   No results for input(s): AMMONIA in the last 168 hours.  ABG    Component Value Date/Time   PHART 7.417 06/23/2018 0535   PCO2ART 29.2 (L) 07/06/2018 0535   PO2ART 67.0 (L) 07/13/2018 0535   HCO3 18.8 (L) 06/30/2018 0535   TCO2 20 (L) 06/24/2018 0535   ACIDBASEDEF 5.0  (H) 06/19/2018 0535   O2SAT 94.0 07/14/2018 0535     Coagulation Profile: No results for input(s): INR, PROTIME in the last 168 hours.  Cardiac Enzymes: No results for input(s): CKTOTAL, CKMB, CKMBINDEX, TROPONINI in the last 168 hours.  HbA1C: Hemoglobin A1C  Date/Time Value Ref Range Status  08/23/2012 04:40 AM 10.1 (H) 4.2 - 6.3 % Final    Comment:    The American Diabetes Association recommends that a primary goal of therapy should be <7% and that physicians should reevaluate the treatment regimen in patients with HbA1c values consistently >8%.    Hgb A1c MFr Bld  Date/Time Value Ref Range Status  05/20/2018 04:20 AM 6.6 (H) 4.8 - 5.6 % Final    Comment:    (NOTE) Pre diabetes:          5.7%-6.4% Diabetes:              >  6.4% Glycemic control for   <7.0% adults with diabetes   12/03/2017 08:00 PM 7.5 (H) 4.8 - 5.6 % Final    Comment:    (NOTE) Pre diabetes:          5.7%-6.4% Diabetes:              >6.4% Glycemic control for   <7.0% adults with diabetes     CBG: Recent Labs  Lab 07/02/18 1151 07/02/18 1713 07/02/18 2140 07/03/18 0747 07/03/18 1203  GLUCAP 133* 106* 94 94 72    Review of Systems:   Not available  Past Medical History  He,  has a past medical history of A-fib ([place]), Aortic stenosis, moderate (11/2015), Arthritis, CAD (coronary artery disease), Chronic combined systolic (congestive) and diastolic (congestive) heart failure (HCC), COPD (chronic obstructive pulmonary disease) ([place]), Diabetes mellitus without complication ([place]), GERD (gastroesophageal reflux disease), History of cardioversion, Hypertension, Influenza B (06/2018), MI (myocardial infarction) ([place]), OSA on CPAP, Sepsis ([place]), Stroke ([place]), and SVT (supraventricular tachycardia) ([place]).   Surgical History    Past Surgical History:  Procedure Laterality Date  . AMPUTATION TOE Right 05/06/2015   Procedure: AMPUTATION TOE;  Surgeon: Sharlotte Alamo, MD;  Location: [facility] ORS;  Service:  Podiatry;  Laterality: Right;  . BUNIONECTOMY    . CARDIAC CATHETERIZATION N/A 10/02/2015   Procedure: Coronary/Grafts Angiography;  Surgeon: Teodoro Spray, MD;  Location: [place] CV LAB;  Service: Cardiovascular;  Laterality: N/A;  . CARDIAC CATHETERIZATION N/A 11/21/2015   Procedure: Right and Left Heart Cath;  Surgeon: Sherren Mocha, MD;  Location: [place] CV LAB;  Service: Cardiovascular;  Laterality: N/A;  . CORONARY STENT INTERVENTION N/A 07/30/2017   Procedure: CORONARY STENT INTERVENTION;  Surgeon: Isaias Cowman, MD;  Location: [place] CV LAB;  Service: Cardiovascular;  Laterality: N/A;  . CORONARY STENT INTERVENTION N/A 10/23/2017   Procedure: CORONARY STENT INTERVENTION;  Surgeon: Yolonda Kida, MD;  Location: [place] CV LAB;  Service: Cardiovascular;  Laterality: N/A;  . KNEE SURGERY    . LEFT HEART CATH AND CORONARY ANGIOGRAPHY N/A 10/30/2017   Procedure: LEFT HEART CATH AND CORONARY ANGIOGRAPHY;  Surgeon: Teodoro Spray, MD;  Location: [place] CV LAB;  Service: Cardiovascular;  Laterality: N/A;  . LEFT HEART CATH AND CORS/GRAFTS ANGIOGRAPHY N/A 10/22/2017   Procedure: LEFT HEART CATH AND CORS/GRAFTS ANGIOGRAPHY;  Surgeon: Corey Skains, MD;  Location: [place] CV LAB;  Service: Cardiovascular;  Laterality: N/A;  . RIGHT/LEFT HEART CATH AND CORONARY/GRAFT ANGIOGRAPHY N/A 07/30/2017   Procedure: RIGHT/LEFT HEART CATH AND CORONARY/GRAFT ANGIOGRAPHY;  Surgeon: Isaias Cowman, MD;  Location: [place] CV LAB;  Service: Cardiovascular;  Laterality: N/A;  . TEE WITHOUT CARDIOVERSION N/A 11/21/2015   Procedure: TRANSESOPHAGEAL ECHOCARDIOGRAM (TEE);  Surgeon: Larey Dresser, MD;  Location: [place];  Service: Cardiovascular;  Laterality: N/A;  . TRACHEOSTOMY    . TRACHEOSTOMY CLOSURE       Social History   reports that he has never smoked. He has never used smokeless tobacco. He reports that he does not drink alcohol or use drugs.    Family History   His family history includes Diabetes in his brother and brother; Heart attack in his brother, father, and mother; Heart disease in his brother; Hypertension in his brother, father, and mother; Lung disease in his brother; Stroke in his mother.   Allergies Allergies  Allergen Reactions  . Contrast Media [Iodinated Diagnostic Agents] Anaphylaxis and Shortness Of Breath    Hypoxia/hypotension - anaphylaxis occurred  despite premedication  . Versed [Midazolam] Shortness Of Breath and Other (See Comments)    Hypotension     Home Medications  Prior to Admission medications   Medication Sig Start Date End Date Taking? Authorizing Provider  acetaminophen (TYLENOL) 325 MG tablet Take 2 tablets (650 mg total) by mouth every 6 (six) hours as needed for moderate pain (headache). 05/27/18  Yes Gouru, Illene Silver, MD  albuterol (PROVENTIL HFA;VENTOLIN HFA) 108 (90 Base) MCG/ACT inhaler Inhale 2 puffs into the lungs every 6 (six) hours as needed for wheezing or shortness of breath. 07/11/15  Yes Kasa, Maretta Bees, MD  apixaban (ELIQUIS) 5 MG TABS tablet Take 5 mg by mouth 2 (two) times daily.   Yes [provider]  atorvastatin (LIPITOR) 40 MG tablet Take 1 tablet (40 mg total) by mouth daily at 6 PM. 10/24/17  Yes Gouru, Aruna, MD  DULoxetine (CYMBALTA) 20 MG capsule Take 20 mg by mouth daily.   Yes [provider]  famotidine (PEPCID) 20 MG tablet Take 1 tablet (20 mg total) by mouth 2 (two) times daily. 06/27/18  Yes Carrie Mew, MD  Fluticasone-Salmeterol (ADVAIR) 250-50 MCG/DOSE AEPB Inhale 1 puff into the lungs 2 (two) times daily. 11/30/17  Yes Gladstone Lighter, MD  guaiFENesin-codeine 100-10 MG/5ML syrup Take 5 mLs by mouth every 6 (six) hours as needed for cough. 06/30/18  Yes [provider]  ipratropium-albuterol (DUONEB) 0.5-2.5 (3) MG/3ML SOLN Take 3 mLs by nebulization 2 (two) times daily. 05/27/18  Yes Gouru, Illene Silver, MD  metFORMIN (GLUCOPHAGE) 1000 MG  tablet Take 1,000 mg by mouth 2 (two) times daily with a meal.   Yes [provider]  metoprolol tartrate (LOPRESSOR) 25 MG tablet Take 1 tablet (25 mg total) by mouth 2 (two) times daily. 05/27/18  Yes Gouru, Aruna, MD  ondansetron (ZOFRAN-ODT) 4 MG disintegrating tablet Take 4 mg by mouth every 8 (eight) hours as needed for nausea/vomiting.   Yes [provider]  pantoprazole (PROTONIX) 40 MG tablet Take 1 tablet (40 mg total) by mouth 2 (two) times daily. Patient taking differently: Take 40 mg by mouth daily.  12/05/17  Yes Gladstone Lighter, MD  polyethylene glycol (MIRALAX / GLYCOLAX) packet Take 17 g by mouth daily. Patient taking differently: Take 17 g by mouth daily as needed for mild constipation.  05/28/18  Yes Gouru, Illene Silver, MD  Potassium Chloride ER 20 MEQ TBCR Take 10 mEq by mouth daily. Patient taking differently: Take 20 mEq by mouth daily.  05/27/18  Yes Gouru, Illene Silver, MD  pramipexole (MIRAPEX) 0.5 MG tablet Take 0.5 mg by mouth 3 (three) times daily as needed.    Yes [provider]  senna (SENOKOT) 8.6 MG TABS tablet Take 1 tablet (8.6 mg total) by mouth daily as needed for mild constipation. 05/27/18  Yes Gouru, Illene Silver, MD  sucralfate (CARAFATE) 1 g tablet Take 1 tablet (1 g total) by mouth 4 (four) times daily. 06/27/18  Yes Carrie Mew, MD  torsemide (DEMADEX) 20 MG tablet Take 1 tablet (20 mg total) by mouth daily. 05/28/18  Yes Gouru, Illene Silver, MD  zolpidem (AMBIEN) 5 MG tablet Take 1 tablet (5 mg total) by mouth at bedtime as needed for sleep. 11/01/17  Yes Fritzi Mandes, MD  budesonide (PULMICORT) 0.5 MG/2ML nebulizer solution Take 2 mLs (0.5 mg total) by nebulization 2 (two) times daily. 05/27/18   [name], MD  clopidogrel (PLAVIX) 75 MG tablet Take 1 tablet (75 mg total) by mouth daily with breakfast. Patient not taking: Reported  on 05/19/2018 08/01/17   Max Sane, MD  oxyCODONE (OXY IR/ROXICODONE) 5 MG immediate release tablet Take 1-2 tablets  (5-10 mg total) by mouth every 6 (six) hours as needed for moderate pain, severe pain or breakthrough pain. Patient not taking: Reported on 06/26/2018 06/01/18   Dustin Flock, MD  sodium chloride (OCEAN) 0.65 % SOLN nasal spray Place 1 spray into both nostrils as needed for congestion. Patient not taking: Reported on 06/14/2018 05/27/18   [name], MD     Critical care time: 102 min app    Richardson Landry  ACNP [facility] PCCM Pager [phone] till 1 pm If no answer page [phone] 07/03/2018, 2:33 PM

## 2018-07-03 NOTE — Progress Notes (Addendum)
   07/03/18 1600  Clinical Encounter Type  Visited With Patient;Health care provider  Visit Type Code  Responded to Patient being coded. No family present. Patient has pulse and was transported to [facility] room 8.  Provided silent prayer and the ministry of presence.

## 2018-07-04 ENCOUNTER — Inpatient Hospital Stay (HOSPITAL_COMMUNITY): Payer: Medicare HMO

## 2018-07-04 DIAGNOSIS — I480 Paroxysmal atrial fibrillation: Secondary | ICD-10-CM

## 2018-07-04 DIAGNOSIS — J410 Simple chronic bronchitis: Secondary | ICD-10-CM

## 2018-07-04 LAB — IRON AND TIBC
IRON: 16 ug/dL — AB (ref 45–182)
Saturation Ratios: 8 % — ABNORMAL LOW (ref 17.9–39.5)
TIBC: 213 ug/dL — ABNORMAL LOW (ref 250–450)
UIBC: 197 ug/dL

## 2018-07-04 LAB — BASIC METABOLIC PANEL
Anion gap: 20 — ABNORMAL HIGH (ref 5–15)
BUN: 46 mg/dL — ABNORMAL HIGH (ref 8–23)
CO2: 16 mmol/L — ABNORMAL LOW (ref 22–32)
Calcium: 6.7 mg/dL — ABNORMAL LOW (ref 8.9–10.3)
Chloride: 96 mmol/L — ABNORMAL LOW (ref 98–111)
Creatinine, Ser: 2.29 mg/dL — ABNORMAL HIGH (ref 0.61–1.24)
GFR calc Af Amer: 34 mL/min — ABNORMAL LOW (ref >60)
GFR calc non Af Amer: 29 mL/min — ABNORMAL LOW (ref >60)
Glucose, Bld: 128 mg/dL — ABNORMAL HIGH (ref 70–99)
POTASSIUM: 5 mmol/L (ref 3.5–5.1)
Sodium: 132 mmol/L — ABNORMAL LOW (ref 135–145)

## 2018-07-04 LAB — RENAL FUNCTION PANEL
Albumin: 2.7 g/dL — ABNORMAL LOW (ref 3.5–5.0)
Anion gap: 21 — ABNORMAL HIGH (ref 5–15)
BUN: 54 mg/dL — ABNORMAL HIGH (ref 8–23)
CO2: 13 mmol/L — ABNORMAL LOW (ref 22–32)
Calcium: 6.8 mg/dL — ABNORMAL LOW (ref 8.9–10.3)
Chloride: 102 mmol/L (ref 98–111)
Creatinine, Ser: 2.58 mg/dL — ABNORMAL HIGH (ref 0.61–1.24)
GFR calc Af Amer: 30 mL/min — ABNORMAL LOW (ref >60)
GFR calc non Af Amer: 26 mL/min — ABNORMAL LOW (ref >60)
Glucose, Bld: 125 mg/dL — ABNORMAL HIGH (ref 70–99)
Phosphorus: 5.8 mg/dL — ABNORMAL HIGH (ref 2.5–4.6)
Potassium: 5.7 mmol/L — ABNORMAL HIGH (ref 3.5–5.1)
Sodium: 136 mmol/L (ref 135–145)

## 2018-07-04 LAB — COOXEMETRY PANEL
Carboxyhemoglobin: 1.4 % (ref 0.5–1.5)
Methemoglobin: 1.2 % (ref 0.0–1.5)
O2 Saturation: 78.9 %
Total hemoglobin: 7 g/dL — ABNORMAL LOW (ref 12.0–16.0)

## 2018-07-04 LAB — LACTIC ACID, PLASMA
LACTIC ACID, VENOUS: 7.8 mmol/L — AB (ref 0.5–1.9)
Lactic Acid, Venous: 6.6 mmol/L (ref 0.5–1.9)
Lactic Acid, Venous: 8.7 mmol/L (ref 0.5–1.9)

## 2018-07-04 LAB — COMPREHENSIVE METABOLIC PANEL
ALT: 394 U/L — ABNORMAL HIGH (ref 0–44)
AST: 1548 U/L — ABNORMAL HIGH (ref 15–41)
Albumin: 2.9 g/dL — ABNORMAL LOW (ref 3.5–5.0)
Alkaline Phosphatase: 159 U/L — ABNORMAL HIGH (ref 38–126)
Anion gap: 18 — ABNORMAL HIGH (ref 5–15)
BUN: 53 mg/dL — ABNORMAL HIGH (ref 8–23)
CHLORIDE: 100 mmol/L (ref 98–111)
CO2: 17 mmol/L — ABNORMAL LOW (ref 22–32)
Calcium: 7.1 mg/dL — ABNORMAL LOW (ref 8.9–10.3)
Creatinine, Ser: 2.32 mg/dL — ABNORMAL HIGH (ref 0.61–1.24)
GFR calc Af Amer: 34 mL/min — ABNORMAL LOW (ref >60)
GFR calc non Af Amer: 29 mL/min — ABNORMAL LOW (ref >60)
Glucose, Bld: 179 mg/dL — ABNORMAL HIGH (ref 70–99)
Potassium: 5.8 mmol/L — ABNORMAL HIGH (ref 3.5–5.1)
Sodium: 135 mmol/L (ref 135–145)
Total Bilirubin: 4 mg/dL — ABNORMAL HIGH (ref 0.3–1.2)
Total Protein: 5.8 g/dL — ABNORMAL LOW (ref 6.5–8.1)

## 2018-07-04 LAB — POCT I-STAT 3, ART BLOOD GAS (G3+)
Acid-base deficit: 8 mmol/L — ABNORMAL HIGH (ref 0.0–2.0)
BICARBONATE: 17 mmol/L — AB (ref 20.0–28.0)
O2 Saturation: 97 %
Patient temperature: 98.6
TCO2: 18 mmol/L — ABNORMAL LOW (ref 22–32)
pCO2 arterial: 31.3 mmHg — ABNORMAL LOW (ref 32.0–48.0)
pH, Arterial: 7.344 — ABNORMAL LOW (ref 7.350–7.450)
pO2, Arterial: 96 mmHg (ref 83.0–108.0)

## 2018-07-04 LAB — CBC
HEMATOCRIT: 29.2 % — AB (ref 39.0–52.0)
Hemoglobin: 8.4 g/dL — ABNORMAL LOW (ref 13.0–17.0)
MCH: 22.3 pg — ABNORMAL LOW (ref 26.0–34.0)
MCHC: 28.8 g/dL — ABNORMAL LOW (ref 30.0–36.0)
MCV: 77.7 fL — AB (ref 80.0–100.0)
Platelets: 187 10*3/uL (ref 150–400)
RBC: 3.76 MIL/uL — ABNORMAL LOW (ref 4.22–5.81)
RDW: 20.4 % — ABNORMAL HIGH (ref 11.5–15.5)
WBC: 19.6 10*3/uL — AB (ref 4.0–10.5)
nRBC: 0.6 % — ABNORMAL HIGH (ref 0.0–0.2)

## 2018-07-04 LAB — TROPONIN I: Troponin I: 1.55 ng/mL (ref <0.03)

## 2018-07-04 LAB — GLUCOSE, CAPILLARY
Glucose-Capillary: 157 mg/dL — ABNORMAL HIGH (ref 70–99)
Glucose-Capillary: 161 mg/dL — ABNORMAL HIGH (ref 70–99)
Glucose-Capillary: 122 mg/dL — ABNORMAL HIGH (ref 70–99)
Glucose-Capillary: 123 mg/dL — ABNORMAL HIGH (ref 70–99)

## 2018-07-04 LAB — PHOSPHORUS
Phosphorus: 5.4 mg/dL — ABNORMAL HIGH (ref 2.5–4.6)
Phosphorus: 5 mg/dL — ABNORMAL HIGH (ref 2.5–4.6)

## 2018-07-04 LAB — CK: Total CK: 711 U/L — ABNORMAL HIGH (ref 49–397)

## 2018-07-04 LAB — URIC ACID: URIC ACID, SERUM: 12.3 mg/dL — AB (ref 3.7–8.6)

## 2018-07-04 LAB — SODIUM, URINE, RANDOM: Sodium, Ur: 28 mmol/L

## 2018-07-04 LAB — CREATININE, URINE, RANDOM: Creatinine, Urine: 114.5 mg/dL

## 2018-07-04 LAB — MAGNESIUM
Magnesium: 1.8 mg/dL (ref 1.7–2.4)
Magnesium: 2 mg/dL (ref 1.7–2.4)

## 2018-07-04 MED ORDER — METOPROLOL TARTRATE 25 MG/10 ML ORAL SUSPENSION: 25.0000 mg | Freq: Two times a day (BID) | ORAL | Status: DC

## 2018-07-04 MED ORDER — METOPROLOL TARTRATE 5 MG/5ML IV SOLN
5.0000 mg | Freq: Three times a day (TID) | INTRAVENOUS | Status: DC | PRN
  Filled 2018-07-04: qty 5

## 2018-07-04 MED ORDER — SODIUM CHLORIDE 0.9 % IV SOLN
INTRAVENOUS | Status: DC | PRN
  Administered 2018-07-04: 35 mL via INTRAVENOUS
  Administered 2018-07-05: 500 mL via INTRAVENOUS

## 2018-07-04 MED ORDER — SODIUM CHLORIDE 0.9 % IV SOLN
2.0000 g | Freq: Every day | INTRAVENOUS | Status: DC
  Administered 2018-07-04 – 2018-07-06 (×3): 2 g via INTRAVENOUS
  Filled 2018-07-04 (×4): qty 20

## 2018-07-04 MED ORDER — SODIUM CHLORIDE 0.9 % IV SOLN
250.0000 [IU]/h | INTRAVENOUS | Status: DC
  Administered 2018-07-04: 250 [IU]/h via INTRAVENOUS_CENTRAL
  Administered 2018-07-05: 550 [IU]/h via INTRAVENOUS_CENTRAL
  Filled 2018-07-04 (×2): qty 2

## 2018-07-04 MED ORDER — APIXABAN 5 MG PO TABS: 5.0000 mg | ORAL_TABLET | Freq: Two times a day (BID) | ORAL | Status: DC

## 2018-07-04 MED ORDER — OSELTAMIVIR PHOSPHATE 6 MG/ML PO SUSR
30.0000 mg | Freq: Two times a day (BID) | ORAL | Status: DC
  Filled 2018-07-04: qty 12.5

## 2018-07-04 MED ORDER — OSELTAMIVIR PHOSPHATE 6 MG/ML PO SUSR
30.0000 mg | Freq: Two times a day (BID) | ORAL | Status: DC
  Filled 2018-07-04 (×2): qty 12.5

## 2018-07-04 MED ORDER — HEPARIN SODIUM (PORCINE) 1000 UNIT/ML DIALYSIS
1000.0000 [IU] | INTRAMUSCULAR | Status: DC | PRN
  Administered 2018-07-04: 2800 [IU] via INTRAVENOUS_CENTRAL
  Filled 2018-07-04 (×2): qty 6
  Filled 2018-07-04: qty 3

## 2018-07-04 MED ORDER — SODIUM CHLORIDE 0.9 % IV SOLN: INTRAVENOUS | Status: DC | PRN

## 2018-07-04 MED ORDER — VANCOMYCIN HCL 10 G IV SOLR
2500.0000 mg | Freq: Once | INTRAVENOUS | Status: AC
  Administered 2018-07-04: 2500 mg via INTRAVENOUS
  Filled 2018-07-04: qty 2500

## 2018-07-04 MED ORDER — PRISMASOL BGK 4/2.5 32-4-2.5 MEQ/L IV SOLN
INTRAVENOUS | Status: DC
  Administered 2018-07-04 – 2018-07-05 (×11): via INTRAVENOUS_CENTRAL
  Administered 2018-07-06: 1 mL via INTRAVENOUS_CENTRAL
  Administered 2018-07-06 (×4): via INTRAVENOUS_CENTRAL
  Administered 2018-07-06 (×2): 1 mL via INTRAVENOUS_CENTRAL
  Administered 2018-07-07 (×3): via INTRAVENOUS_CENTRAL
  Filled 2018-07-04 (×31): qty 5000

## 2018-07-04 MED ORDER — MIDAZOLAM HCL 2 MG/2ML IJ SOLN
2.0000 mg | INTRAMUSCULAR | Status: DC | PRN
  Administered 2018-07-05: 2 mg via INTRAVENOUS
  Filled 2018-07-04: qty 2

## 2018-07-04 MED ORDER — HYDROCORTISONE NA SUCCINATE PF 100 MG IJ SOLR
50.0000 mg | Freq: Four times a day (QID) | INTRAMUSCULAR | Status: DC
  Administered 2018-07-04 – 2018-07-07 (×11): 50 mg via INTRAVENOUS
  Filled 2018-07-04 (×11): qty 2

## 2018-07-04 MED ORDER — VANCOMYCIN HCL 10 G IV SOLR
1250.0000 mg | INTRAVENOUS | Status: DC
  Administered 2018-07-05 – 2018-07-06 (×2): 1250 mg via INTRAVENOUS
  Filled 2018-07-04 (×3): qty 1250

## 2018-07-04 MED ORDER — SODIUM POLYSTYRENE SULFONATE 15 GM/60ML PO SUSP
30.0000 g | Freq: Once | ORAL | Status: DC
  Filled 2018-07-04: qty 120

## 2018-07-04 MED ORDER — PRISMASOL BGK 4/2.5 32-4-2.5 MEQ/L REPLACEMENT SOLN
Status: DC
  Administered 2018-07-04 – 2018-07-06 (×7): via INTRAVENOUS_CENTRAL
  Administered 2018-07-06: 1 via INTRAVENOUS_CENTRAL
  Administered 2018-07-07: 02:00:00 via INTRAVENOUS_CENTRAL
  Filled 2018-07-04 (×11): qty 5000

## 2018-07-04 MED ORDER — PRO-STAT SUGAR FREE PO LIQD
30.0000 mL | Freq: Two times a day (BID) | ORAL | Status: DC
  Administered 2018-07-04 – 2018-07-05 (×3): 30 mL
  Filled 2018-07-04 (×4): qty 30

## 2018-07-04 MED ORDER — FUROSEMIDE 10 MG/ML IJ SOLN
160.0000 mg | Freq: Four times a day (QID) | INTRAVENOUS | Status: DC
  Administered 2018-07-05: 160 mg via INTRAVENOUS
  Filled 2018-07-04 (×4): qty 16

## 2018-07-04 MED ORDER — SODIUM CHLORIDE 0.9 % FOR CRRT
INTRAVENOUS_CENTRAL | Status: DC | PRN
  Administered 2018-07-04 (×2): via INTRAVENOUS_CENTRAL
  Filled 2018-07-04: qty 1000

## 2018-07-04 MED ORDER — OSELTAMIVIR PHOSPHATE 75 MG PO CAPS
75.0000 mg | ORAL_CAPSULE | Freq: Once | ORAL | Status: AC
  Administered 2018-07-04: 75 mg via ORAL
  Filled 2018-07-04: qty 1

## 2018-07-04 MED ORDER — VITAL HIGH PROTEIN PO LIQD
1000.0000 mL | ORAL | Status: DC
  Administered 2018-07-04: 1000 mL

## 2018-07-04 MED ORDER — FAMOTIDINE 40 MG/5ML PO SUSR
20.0000 mg | Freq: Every day | ORAL | Status: DC
  Administered 2018-07-04 – 2018-07-05 (×2): 20 mg
  Filled 2018-07-04 (×2): qty 2.5

## 2018-07-04 MED ORDER — CALCIUM GLUCONATE-NACL 1-0.675 GM/50ML-% IV SOLN
1.0000 g | Freq: Once | INTRAVENOUS | Status: AC
  Administered 2018-07-04: 1000 mg via INTRAVENOUS
  Filled 2018-07-04: qty 50

## 2018-07-04 MED ORDER — METOPROLOL TARTRATE 25 MG/10 ML ORAL SUSPENSION: 5.0000 mg | ORAL | Status: DC | PRN

## 2018-07-04 MED ORDER — STERILE WATER FOR INJECTION IV SOLN
INTRAVENOUS | Status: DC
  Administered 2018-07-04 – 2018-07-06 (×11): via INTRAVENOUS_CENTRAL
  Filled 2018-07-04 (×18): qty 150

## 2018-07-04 MED ORDER — ATORVASTATIN CALCIUM 40 MG PO TABS: 40.0000 mg | ORAL_TABLET | Freq: Every day | ORAL | Status: DC

## 2018-07-04 MED ORDER — SUCRALFATE 1 GM/10ML PO SUSP
1.0000 g | Freq: Four times a day (QID) | ORAL | Status: DC
  Administered 2018-07-04 – 2018-07-06 (×7): 1 g
  Filled 2018-07-04 (×10): qty 10

## 2018-07-04 MED ORDER — HEPARIN BOLUS VIA INFUSION (CRRT)
1000.0000 [IU] | INTRAVENOUS | Status: DC | PRN
  Filled 2018-07-04: qty 1000

## 2018-07-04 MED ORDER — PANTOPRAZOLE SODIUM 40 MG PO PACK
40.0000 mg | PACK | Freq: Two times a day (BID) | ORAL | Status: DC
  Administered 2018-07-04 – 2018-07-06 (×6): 40 mg
  Filled 2018-07-04 (×6): qty 20

## 2018-07-04 MED ORDER — HYDROXYZINE HCL 25 MG PO TABS: 25.0000 mg | ORAL_TABLET | Freq: Three times a day (TID) | ORAL | Status: DC | PRN

## 2018-07-04 MED ORDER — SODIUM CHLORIDE 0.9 % IV SOLN
1.2500 ng/kg/min | INTRAVENOUS | Status: AC
  Administered 2018-07-04: 5 ng/kg/min via INTRAVENOUS
  Administered 2018-07-05: 25 ng/kg/min via INTRAVENOUS
  Administered 2018-07-05: 13.924 ng/kg/min via INTRAVENOUS
  Filled 2018-07-04 (×4): qty 1

## 2018-07-04 MED ORDER — VASOPRESSIN 20 UNIT/ML IV SOLN
0.0300 [IU]/min | INTRAVENOUS | Status: DC
  Administered 2018-07-04 – 2018-07-06 (×4): 0.03 [IU]/min via INTRAVENOUS
  Filled 2018-07-04 (×3): qty 2

## 2018-07-04 MED FILL — Medication: Qty: 1 | Status: AC

## 2018-07-04 NOTE — Progress Notes (Signed)
CRITICAL VALUE ALERT  Critical Value:  Troponin 0.72  Date & Time Notied:  07/04/2018 0000  Provider Notified:  Dr Deterding  Orders Received/Actions taken: none

## 2018-07-04 NOTE — Progress Notes (Addendum)
eLink Physician-Brief Progress Note Patient Name: Marcus Williams DOB: 1955-12-27 MRN: [id number]   Date of Service  07/04/2018  HPI/Events of Note  O2 sats in the high 80s to 90s. Troponin also with no follow up.   eICU Interventions  Check ABG and follow up troponin.     Intervention Category Minor Interventions: Other:  Elsie Lincoln 07/04/2018, 8:58 PM    10:21 PM Pt is not making any purposeful movements.  He extends his arms intermittently but not purposeful.  He is intubated, synchronous with the vent.  ABG reviewed - 7.428/28.7/91 on current vent settings.  Plan: Continue on current vent settings.  Increased Versed pushes prn.

## 2018-07-04 NOTE — Progress Notes (Signed)
Pharmacy Antibiotic Note  Marcus Williams is a 63 y.o. male admitted on 06/26/2018 with fever and shortness of breath.  Pharmacy has been consulted to add vancomycin dosing to his already ordered ceftriaxone for broad antibiotic coverage for possible CAP.  Patient currently afebrile, wbc up to 19.6 (has been trending up since admission) LA of 7. Noted shocked liver, and AKI with scr up to 2.3.   Given worsening of renal function over the past few days will defer using AUC dosing nomogram and use conventional dosing.   Plan: Vancomycin 1250 IV every 24 hours.  Goal trough 15-20 mcg/mL. Continue ceftriaxone 2g q24 hours as ordered  Height: 5\' 10"  (177.8 cm) Weight: 263 lb 14.3 oz (119.7 kg) IBW/kg (Calculated) : 73  Temp (24hrs), Avg:98.4 F (36.9 C), Min:97.1 F (36.2 C), Max:99.9 F (37.7 C)  Recent Labs  Lab 06/27/18 1403 06/25/2018 0354  07/02/18 0543 07/02/18 0814 07/03/18 0259 07/03/18 0804 07/03/18 1013 07/04/18 0226  WBC 6.3 5.9  --  10.6*  --  12.5*  --   --  19.6*  CREATININE 1.26* 1.38*  --  1.85*  --  1.97*  --   --  2.32*  LATICACIDVEN  --   --    < > 5.5* 4.6*  --  7.4* 7.0* 6.6*   < > = values in this interval not displayed.    Estimated Creatinine Clearance: 42.8 mL/min (A) (by C-G formula based on SCr of 2.32 mg/dL (H)).    Allergies  Allergen Reactions  . Contrast Media [Iodinated Diagnostic Agents] Anaphylaxis and Shortness Of Breath    Hypoxia/hypotension - anaphylaxis occurred despite premedication  . Versed [Midazolam] Shortness Of Breath and Other (See Comments)    Hypotension    Antimicrobials this admission: Ceftriaxone 1/19>> Vancomycin 1/19>> Tamiflu 1/16>  Microbiology results: 1/16 BCx: ngtd  1/18 MRSA PCR: neg 1/19 Resp cx sent  Thank you for allowing pharmacy to be a part of this patient's care.  Erin Hearing PharmD., BCPS Clinical Pharmacist 07/04/2018 8:47 AM

## 2018-07-04 NOTE — Progress Notes (Signed)
[facility] PCCM  Worsening shock today CVP remains elevated Suspect refractory septic shock Add hydrocortisone Add giaprezza Starting CVVHD Prognosis guarded  Roselie Awkward, MD [place] PCCM Pager: [phone] Cell: [phone] If no response, call [phone]

## 2018-07-04 NOTE — Procedures (Signed)
Hemodialysis Insertion Procedure Note Marcus Williams [id number] 1955-08-04  Procedure: Insertion of Hemodialysis Catheter Type: 3 port  Indications: Hemodialysis   Procedure Details Consent: Risks of procedure as well as the alternatives and risks of each were explained to the (patient/caregiver).  Consent for procedure obtained. Time Out: Verified patient identification, verified procedure, site/side was marked, verified correct patient position, special equipment/implants available, medications/allergies/relevent history reviewed, required imaging and test results available.  Performed  Maximum sterile technique was used including antiseptics, cap, gloves, gown, hand hygiene, mask and sheet. Skin prep: Chlorhexidine; local anesthetic administered A antimicrobial bonded/coated triple lumen catheter was placed in the right femoral vein due to patient being a dialysis patient using the Seldinger technique. Ultrasound guidance used.Yes.   Catheter placed to 20 cm. Blood aspirated via all 3 ports and then flushed x 3. Line sutured x 2 and dressing applied.  Evaluation Blood flow good Complications: No apparent complications Patient did tolerate procedure well. Chest X-ray ordered to verify placement.  CXR: not needed    Rt fem v/a  Richardson Landry Minor ACNP [facility] PCCM Pager [phone] till 1 pm If no answer page [phone] 07/04/2018, 11:57 AM

## 2018-07-04 NOTE — Procedures (Signed)
Arterial Catheter Insertion Procedure Note Marcus Williams [id number] 05-Apr-1956  Procedure: Insertion of Arterial Catheter  Indications: Blood pressure monitoring and Frequent blood sampling  Procedure Details Consent: Unable to obtain consent because of altered level of consciousness. Time Out: Verified patient identification, verified procedure, site/side was marked, verified correct patient position, special equipment/implants available, medications/allergies/relevent history reviewed, required imaging and test results available.  Performed  Maximum sterile technique was used including antiseptics, cap, gloves, gown, hand hygiene, mask and sheet. Skin prep: Chlorhexidine; local anesthetic administered 20 gauge catheter was inserted into right radial artery using the Seldinger technique. ULTRASOUND GUIDANCE USED: NO Evaluation Blood flow good; BP tracing good. Complications: No apparent complications. RT placed arrow catheter on first attempt.   Marcus Williams 07/04/2018

## 2018-07-04 NOTE — Progress Notes (Signed)
CRITICAL VALUE ALERT  Critical Value:  Lactic 7.8  Date & Time Notied: 07/04/18 0945  Provider Notified: Philipp Ovens  Orders Received/Actions taken: N/A

## 2018-07-04 NOTE — Progress Notes (Signed)
CRITICAL VALUE ALERT  Critical Value: Lactic Acid 8.7  Date & Time Notied: 07/04/17 1445   Provider Notified: Lake Bells  Orders Received/Actions taken: See New orders

## 2018-07-04 NOTE — Consult Note (Signed)
Reason for Consult:AKI Referring Physician: Dr. Nolon Lennert is an 63 y.o. male.  HPI: 63 yr male with extensive PMH including Long term Type 2 DM, HTN , obesity , OSA, GERD, CAD with mutliple stents, CABG, CVA, AFib/flutter, COPD O2 dependent, AS with recent LV-aorta conduit with prolong hosp, decub, R foot ulcer , PVD with R great toe amp. Admitted 1/16 with flu, and SVT. Had progressive lactic acidemia, and had PEA Arrest on 1/18.  Cr on admit 1.28 and now 2.32. He is oliguric, hypotensive on pressors, vent.  No hx known renal dz per wife..  Now K 5.8, bicarb 17.  No ACEI or NSAIDS  . Is on PPI, urine has consistently had proteinuria and now 6-10 WBC Review of systems not obtained due to patient factors.    Past Medical History:  Diagnosis Date  . A-fib ([place])    on Pradaxa  . Aortic stenosis, moderate 11/2015  . Arthritis   . CAD (coronary artery disease)   . Chronic combined systolic (congestive) and diastolic (congestive) heart failure ([place])   . COPD (chronic obstructive pulmonary disease) (HCC)    2L home O2  . Diabetes mellitus without complication ([place])    INSULIN DEPENDENT  . GERD (gastroesophageal reflux disease)   . History of cardioversion   . Hypertension   . Influenza B 06/2018  . MI (myocardial infarction) ([place])   . OSA on CPAP   . Sepsis ([place])   . Stroke ([place])   . SVT (supraventricular tachycardia) (HCC)     Past Surgical History:  Procedure Laterality Date  . 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 BUNIONECTOMY    . CARDIAC CATHETERIZATION N/A 10/02/2015   Procedure: Coronary/Grafts Angiography;  Surgeon: Teodoro Spray, MD;  Location: [facility];  Service: Cardiovascular;  Laterality: N/A;  . CARDIAC CATHETERIZATION N/A 11/21/2015   Procedure: Right and Left Heart Cath;  Surgeon: Sherren Mocha, MD;  Location: [facility];   Service: Cardiovascular;  Laterality: N/A;  . CORONARY STENT INTERVENTION N/A 07/30/2017   Procedure: CORONARY STENT INTERVENTION;  Surgeon: Isaias Cowman, MD;  Location: [facility];  Service: Cardiovascular;  Laterality: N/A;  . CORONARY STENT INTERVENTION N/A 10/23/2017   Procedure: CORONARY STENT INTERVENTION;  Surgeon: Yolonda Kida, MD;  Location: [facility];  Service: Cardiovascular;  Laterality: N/A;  . KNEE SURGERY    . LEFT HEART CATH AND CORONARY ANGIOGRAPHY N/A 10/30/2017   Procedure: LEFT HEART CATH AND CORONARY ANGIOGRAPHY;  Surgeon: Teodoro Spray, MD;  Location: [facility];  Service: Cardiovascular;  Laterality: N/A;  . LEFT HEART CATH AND CORS/GRAFTS ANGIOGRAPHY N/A 10/22/2017   Procedure: LEFT HEART CATH AND CORS/GRAFTS ANGIOGRAPHY;  Surgeon: Corey Skains, MD;  Location: [facility];  Service: Cardiovascular;  Laterality: N/A;  . RIGHT/LEFT HEART CATH AND CORONARY/GRAFT ANGIOGRAPHY N/A 07/30/2017   Procedure: RIGHT/LEFT HEART CATH AND CORONARY/GRAFT ANGIOGRAPHY;  Surgeon: Isaias Cowman, MD;  Location: [facility];  Service: Cardiovascular;  Laterality: N/A;  . TEE WITHOUT CARDIOVERSION N/A 11/21/2015   Procedure: TRANSESOPHAGEAL ECHOCARDIOGRAM (TEE);  Surgeon: Larey Dresser, MD;  Location: [facility];  Service: Cardiovascular;  Laterality: N/A;  . TRACHEOSTOMY    . TRACHEOSTOMY CLOSURE      Family History  Problem Relation Age of Onset  . Stroke Mother   .  Heart attack Mother   . Hypertension Mother   . Heart attack Father   . Hypertension Father   . Heart attack Brother        #1  . Diabetes Brother        #1  . Heart disease Brother        #2  . Lung disease Brother        #2  . Hypertension Brother        #2  . Diabetes Brother        #2    Social History:  reports that he has never smoked. He has never used smokeless tobacco. He reports that he does not drink alcohol or use  drugs.  Allergies:  Allergies  Allergen Reactions  . Contrast Media [Iodinated Diagnostic Agents] Anaphylaxis and Shortness Of Breath    Hypoxia/hypotension - anaphylaxis occurred despite premedication  . Versed [Midazolam] Shortness Of Breath and Other (See Comments)    Hypotension    Medications:  I have reviewed the patient's current medications. Prior to Admission:  Medications Prior to Admission  Medication Sig Dispense Refill Last Dose  . acetaminophen (TYLENOL) 325 MG tablet Take 2 tablets (650 mg total) by mouth every 6 (six) hours as needed for moderate pain (headache).   06/30/2018 at Unknown time  . albuterol (PROVENTIL HFA;VENTOLIN HFA) 108 (90 Base) MCG/ACT inhaler Inhale 2 puffs into the lungs every 6 (six) hours as needed for wheezing or shortness of breath. 1 Inhaler 10 06/30/2018 at Unknown time  . apixaban (ELIQUIS) 5 MG TABS tablet Take 5 mg by mouth 2 (two) times daily.   06/30/2018 at 2000  . atorvastatin (LIPITOR) 40 MG tablet Take 1 tablet (40 mg total) by mouth daily at 6 PM. 30 tablet 0 06/30/2018 at Unknown time  . DULoxetine (CYMBALTA) 20 MG capsule Take 20 mg by mouth daily.   06/30/2018 at Unknown time  . famotidine (PEPCID) 20 MG tablet Take 1 tablet (20 mg total) by mouth 2 (two) times daily. 60 tablet 0 06/30/2018 at Unknown time  . Fluticasone-Salmeterol (ADVAIR) 250-50 MCG/DOSE AEPB Inhale 1 puff into the lungs 2 (two) times daily. 60 each 1 06/30/2018 at Unknown time  . guaiFENesin-codeine 100-10 MG/5ML syrup Take 5 mLs by mouth every 6 (six) hours as needed for cough.   06/30/2018 at Unknown time  . ipratropium-albuterol (DUONEB) 0.5-2.5 (3) MG/3ML SOLN Take 3 mLs by nebulization 2 (two) times daily. 360 mL  06/30/2018 at Unknown time  . metFORMIN (GLUCOPHAGE) 1000 MG tablet Take 1,000 mg by mouth 2 (two) times daily with a meal.   06/30/2018 at Unknown time  . metoprolol tartrate (LOPRESSOR) 25 MG tablet Take 1 tablet (25 mg total) by mouth 2 (two) times daily.    06/30/2018 at 2000  . ondansetron (ZOFRAN-ODT) 4 MG disintegrating tablet Take 4 mg by mouth every 8 (eight) hours as needed for nausea/vomiting.   06/29/2018  . pantoprazole (PROTONIX) 40 MG tablet Take 1 tablet (40 mg total) by mouth 2 (two) times daily. (Patient taking differently: Take 40 mg by mouth daily. ) 60 tablet 2 06/30/2018 at Unknown time  . polyethylene glycol (MIRALAX / GLYCOLAX) packet Take 17 g by mouth daily. (Patient taking differently: Take 17 g by mouth daily as needed for mild constipation. ) 14 each 0 unk at prn  . Potassium Chloride ER 20 MEQ TBCR Take 10 mEq by mouth daily. (Patient taking differently: Take 20 mEq by mouth daily. ) 34 tablet  6 06/30/2018 at Unknown time  . pramipexole (MIRAPEX) 0.5 MG tablet Take 0.5 mg by mouth 3 (three) times daily as needed.    06/30/2018 at Unknown time  . senna (SENOKOT) 8.6 MG TABS tablet Take 1 tablet (8.6 mg total) by mouth daily as needed for mild constipation. 120 each 0 unk at prn  . sucralfate (CARAFATE) 1 g tablet Take 1 tablet (1 g total) by mouth 4 (four) times daily. 120 tablet 1 06/30/2018 at Unknown time  . torsemide (DEMADEX) 20 MG tablet Take 1 tablet (20 mg total) by mouth daily.   06/30/2018 at Unknown time  . zolpidem (AMBIEN) 5 MG tablet Take 1 tablet (5 mg total) by mouth at bedtime as needed for sleep. 10 tablet 0 06/30/2018 at Unknown time  . budesonide (PULMICORT) 0.5 MG/2ML nebulizer solution Take 2 mLs (0.5 mg total) by nebulization 2 (two) times daily.  12 Taking  . clopidogrel (PLAVIX) 75 MG tablet Take 1 tablet (75 mg total) by mouth daily with breakfast. (Patient not taking: Reported on 05/19/2018) 30 tablet 0 Not Taking at Unknown time  . oxyCODONE (OXY IR/ROXICODONE) 5 MG immediate release tablet Take 1-2 tablets (5-10 mg total) by mouth every 6 (six) hours as needed for moderate pain, severe pain or breakthrough pain. (Patient not taking: Reported on 07/11/2018) 20 tablet 0 Not Taking at Unknown time  . sodium chloride  (OCEAN) 0.65 % SOLN nasal spray Place 1 spray into both nostrils as needed for congestion. (Patient not taking: Reported on 06/14/2018)  0 Not Taking at Unknown time    Results for orders placed or performed during the hospital encounter of 06/16/2018 (from the past 48 hour(s))  Glucose, capillary     Status: Abnormal   Collection Time: 07/02/18 11:51 AM  Result Value Ref Range   Glucose-Capillary 133 (H) 70 - 99 mg/dL  Glucose, capillary     Status: Abnormal   Collection Time: 07/02/18  5:13 PM  Result Value Ref Range   Glucose-Capillary 106 (H) 70 - 99 mg/dL  Glucose, capillary     Status: None   Collection Time: 07/02/18  9:40 PM  Result Value Ref Range   Glucose-Capillary 94 70 - 99 mg/dL  CBC     Status: Abnormal   Collection Time: 07/03/18  2:59 AM  Result Value Ref Range   WBC 12.5 (H) 4.0 - 10.5 K/uL   RBC 4.09 (L) 4.22 - 5.81 MIL/uL   Hemoglobin 8.9 (L) 13.0 - 17.0 g/dL    Comment: Reticulocyte Hemoglobin testing may be clinically indicated, consider ordering this additional test [id number]    HCT 31.8 (L) 39.0 - 52.0 %   MCV 77.8 (L) 80.0 - 100.0 fL   MCH 21.8 (L) 26.0 - 34.0 pg   MCHC 28.0 (L) 30.0 - 36.0 g/dL   RDW 20.1 (H) 11.5 - 15.5 %   Platelets 149 (L) 150 - 400 K/uL   nRBC 0.3 (H) 0.0 - 0.2 %    Comment: Performed at [facility], [address] [place], [place] [address]  Basic metabolic panel     Status: Abnormal   Collection Time: 07/03/18  2:59 AM  Result Value Ref Range   Sodium 134 (L) 135 - 145 mmol/L   Potassium 5.3 (H) 3.5 - 5.1 mmol/L   Chloride 102 98 - 111 mmol/L   CO2 17 (L) 22 - 32 mmol/L   Glucose, Bld 120 (H) 70 - 99 mg/dL   BUN 43 (H) 8 -  23 mg/dL   Creatinine, Ser 1.97 (H) 0.61 - 1.24 mg/dL   Calcium 7.6 (L) 8.9 - 10.3 mg/dL   GFR calc non Af Amer 35 (L) >60 mL/min   GFR calc Af Amer 41 (L) >60 mL/min   Anion gap 15 5 - 15    Comment: Performed at [facility] [address], [place], [place] [address]  Glucose, capillary      Status: None   Collection Time: 07/03/18  7:47 AM  Result Value Ref Range   Glucose-Capillary 94 70 - 99 mg/dL  Lactic acid, plasma     Status: Abnormal   Collection Time: 07/03/18  8:04 AM  Result Value Ref Range   Lactic Acid, Venous 7.4 (HH) 0.5 - 1.9 mmol/L    Comment: CRITICAL RESULT CALLED TO, READ BACK BY AND VERIFIED WITH: J.GANOE,RN @ 0998 07/03/2018 [place] Performed at [facility], [place] [address] [place], [place] [address]   Hepatic function panel     Status: Abnormal   Collection Time: 07/03/18  8:04 AM  Result Value Ref Range   Total Protein 6.7 6.5 - 8.1 g/dL   Albumin 3.1 (L) 3.5 - 5.0 g/dL   AST 1,203 (H) 15 - 41 U/L   ALT 317 (H) 0 - 44 U/L    Comment: RESULTS CONFIRMED BY MANUAL DILUTION   Alkaline Phosphatase 176 (H) 38 - 126 U/L   Total Bilirubin 2.8 (H) 0.3 - 1.2 mg/dL   Bilirubin, Direct 1.7 (H) 0.0 - 0.2 mg/dL   Indirect Bilirubin 1.1 (H) 0.3 - 0.9 mg/dL    Comment: Performed at [facility] [address] [place], [place] [address]  Magnesium     Status: None   Collection Time: 07/03/18  8:04 AM  Result Value Ref Range   Magnesium 2.0 1.7 - 2.4 mg/dL    Comment: Performed at [facility] [address], [place], [place] [address]  Phosphorus     Status: Abnormal   Collection Time: 07/03/18  8:04 AM  Result Value Ref Range   Phosphorus 5.7 (H) 2.5 - 4.6 mg/dL    Comment: Performed at [facility] [address], [place], [place] [address]  Lactic acid, plasma     Status: Abnormal   Collection Time: 07/03/18 10:13 AM  Result Value Ref Range   Lactic Acid, Venous 7.0 (HH) 0.5 - 1.9 mmol/L    Comment: CRITICAL RESULT CALLED TO, READ BACK BY AND VERIFIED WITH: J.GANOE,RN @ 1100 07/03/2018 [place] Performed at [facility], [place] [address], [place], [place] [address]   Glucose, capillary     Status: None   Collection Time: 07/03/18 12:03 PM  Result Value Ref Range   Glucose-Capillary 72 70 - 99 mg/dL  MRSA PCR  Screening     Status: None   Collection Time: 07/03/18  2:25 PM  Result Value Ref Range   MRSA by PCR NEGATIVE NEGATIVE    Comment:        The GeneXpert MRSA Assay (FDA approved for NASAL specimens only), is one component of a comprehensive MRSA colonization surveillance program. It is not intended to diagnose MRSA infection nor to guide or monitor treatment for MRSA infections. Performed at [facility], [place] [address], [place], [place] [address]   I-STAT 3, arterial blood gas (G3+)     Status: Abnormal   Collection Time: 07/03/18  3:02 PM  Result Value Ref Range   pH,  Arterial 7.159 (LL) 7.350 - 7.450   pCO2 arterial 38.1 32.0 - 48.0 mmHg   pO2, Arterial 152.0 (H) 83.0 - 108.0 mmHg   Bicarbonate 13.6 (L) 20.0 - 28.0 mmol/L   TCO2 15 (L) 22 - 32 mmol/L   O2 Saturation 99.0 %   Acid-base deficit 14.0 (H) 0.0 - 2.0 mmol/L   Patient temperature 98.6 F    Collection site RADIAL, ALLEN'S TEST ACCEPTABLE    Drawn by Operator    Sample type ARTERIAL    Comment NOTIFIED PHYSICIAN   Urinalysis, Routine w reflex microscopic     Status: Abnormal   Collection Time: 07/03/18  4:00 PM  Result Value Ref Range   Color, Urine AMBER (A) YELLOW    Comment: BIOCHEMICALS MAY BE AFFECTED BY COLOR   APPearance CLOUDY (A) CLEAR   Specific Gravity, Urine 1.017 1.005 - 1.030   pH 5.0 5.0 - 8.0   Glucose, UA NEGATIVE NEGATIVE mg/dL   Hgb urine dipstick MODERATE (A) NEGATIVE   Bilirubin Urine NEGATIVE NEGATIVE   Ketones, ur NEGATIVE NEGATIVE mg/dL   Protein, ur 100 (A) NEGATIVE mg/dL   Nitrite NEGATIVE NEGATIVE   Leukocytes, UA NEGATIVE NEGATIVE   RBC / HPF 0-5 0 - 5 RBC/hpf   WBC, UA 6-10 0 - 5 WBC/hpf   Bacteria, UA RARE (A) NONE SEEN   Mucus PRESENT    Hyaline Casts, UA PRESENT    Granular Casts, UA PRESENT    Amorphous Crystal PRESENT    Non Squamous Epithelial 0-5 (A) NONE SEEN    Comment: Performed at [facility], [address], [place], [place] [address]  Glucose,  capillary     Status: Abnormal   Collection Time: 07/03/18  4:34 PM  Result Value Ref Range   Glucose-Capillary 61 (L) 70 - 99 mg/dL  I-STAT 3, arterial blood gas (G3+)     Status: Abnormal   Collection Time: 07/03/18  4:53 PM  Result Value Ref Range   pH, Arterial 7.271 (L) 7.350 - 7.450   pCO2 arterial 29.7 (L) 32.0 - 48.0 mmHg   pO2, Arterial 103.0 83.0 - 108.0 mmHg   Bicarbonate 13.8 (L) 20.0 - 28.0 mmol/L   TCO2 15 (L) 22 - 32 mmol/L   O2 Saturation 97.0 %   Acid-base deficit 12.0 (H) 0.0 - 2.0 mmol/L   Patient temperature 97.5 F    Collection site RADIAL, ALLEN'S TEST ACCEPTABLE    Drawn by Operator    Sample type ARTERIAL   Glucose, capillary     Status: Abnormal   Collection Time: 07/03/18  5:32 PM  Result Value Ref Range   Glucose-Capillary 131 (H) 70 - 99 mg/dL  Glucose, capillary     Status: Abnormal   Collection Time: 07/03/18  7:37 PM  Result Value Ref Range   Glucose-Capillary 127 (H) 70 - 99 mg/dL  Troponin I - Once     Status: Abnormal   Collection Time: 07/03/18 10:45 PM  Result Value Ref Range   Troponin I 0.72 (HH) <0.03 ng/mL    Comment: CRITICAL RESULT CALLED TO, READ BACK BY AND VERIFIED WITH: HENRIQUEZ P,RN 07/03/18 2349 WAYK Performed at [facility], [address] [place], [place] [address]   Glucose, capillary     Status: Abnormal   Collection Time: 07/03/18 11:57 PM  Result Value Ref Range   Glucose-Capillary 154 (H) 70 - 99 mg/dL  Comprehensive metabolic panel     Status: Abnormal   Collection Time:  07/04/18  2:26 AM  Result Value Ref Range   Sodium 135 135 - 145 mmol/L   Potassium 5.8 (H) 3.5 - 5.1 mmol/L   Chloride 100 98 - 111 mmol/L   CO2 17 (L) 22 - 32 mmol/L   Glucose, Bld 179 (H) 70 - 99 mg/dL   BUN 53 (H) 8 - 23 mg/dL   Creatinine, Ser 2.32 (H) 0.61 - 1.24 mg/dL   Calcium 7.1 (L) 8.9 - 10.3 mg/dL   Total Protein 5.8 (L) 6.5 - 8.1 g/dL   Albumin 2.9 (L) 3.5 - 5.0 g/dL   AST 1,548 (H) 15 - 41 U/L   ALT 394 (H) 0 - 44 U/L    Alkaline Phosphatase 159 (H) 38 - 126 U/L   Total Bilirubin 4.0 (H) 0.3 - 1.2 mg/dL   GFR calc non Af Amer 29 (L) >60 mL/min   GFR calc Af Amer 34 (L) >60 mL/min   Anion gap 18 (H) 5 - 15    Comment: Performed at [facility], [place] [address], [place], [place] [address]  CBC     Status: Abnormal   Collection Time: 07/04/18  2:26 AM  Result Value Ref Range   WBC 19.6 (H) 4.0 - 10.5 K/uL   RBC 3.76 (L) 4.22 - 5.81 MIL/uL   Hemoglobin 8.4 (L) 13.0 - 17.0 g/dL    Comment: Reticulocyte Hemoglobin testing may be clinically indicated, consider ordering this additional test [id number]    HCT 29.2 (L) 39.0 - 52.0 %   MCV 77.7 (L) 80.0 - 100.0 fL   MCH 22.3 (L) 26.0 - 34.0 pg   MCHC 28.8 (L) 30.0 - 36.0 g/dL   RDW 20.4 (H) 11.5 - 15.5 %   Platelets 187 150 - 400 K/uL   nRBC 0.6 (H) 0.0 - 0.2 %    Comment: Performed at [facility], [place] [address], [place], [place] [address]  Lactic acid, plasma     Status: Abnormal   Collection Time: 07/04/18  2:26 AM  Result Value Ref Range   Lactic Acid, Venous 6.6 (HH) 0.5 - 1.9 mmol/L    Comment: CRITICAL RESULT CALLED TO, READ BACK BY AND VERIFIED WITH: HENRIQUEZ P,RN 07/04/18 0318 WAYK Performed at [facility], [place] [address], [place], [place] [address]   I-STAT 3, arterial blood gas (G3+)     Status: Abnormal   Collection Time: 07/04/18  3:31 AM  Result Value Ref Range   pH, Arterial 7.344 (L) 7.350 - 7.450   pCO2 arterial 31.3 (L) 32.0 - 48.0 mmHg   pO2, Arterial 96.0 83.0 - 108.0 mmHg   Bicarbonate 17.0 (L) 20.0 - 28.0 mmol/L   TCO2 18 (L) 22 - 32 mmol/L   O2 Saturation 97.0 %   Acid-base deficit 8.0 (H) 0.0 - 2.0 mmol/L   Patient temperature 98.6 F    Collection site RADIAL, ALLEN'S TEST ACCEPTABLE    Drawn by Operator    Sample type ARTERIAL   Glucose, capillary     Status: Abnormal   Collection Time: 07/04/18  3:37 AM  Result Value Ref Range   Glucose-Capillary 157 (H) 70 - 99 mg/dL  Glucose, capillary     Status:  Abnormal   Collection Time: 07/04/18  7:39 AM  Result Value Ref Range   Glucose-Capillary 161 (H) 70 - 99 mg/dL   Comment 1 Notify RN   Lactic acid, plasma     Status: Abnormal   Collection Time: 07/04/18  8:25 AM  Result Value Ref Range   Lactic Acid, Venous 7.8 (HH) 0.5 - 1.9 mmol/L    Comment: CRITICAL RESULT CALLED TO, READ BACK BY AND VERIFIED WITHTeofilo Pod RN AT 8250 07/04/2018/ BY Karie Chimera Performed at [facility], [place] [address] [place], [place] [address]   .Cooxemetry Panel (carboxy, met, total hgb, O2 sat)     Status: Abnormal   Collection Time: 07/04/18  9:06 AM  Result Value Ref Range   Total hemoglobin 7.0 (L) 12.0 - 16.0 g/dL   O2 Saturation 78.9 %   Carboxyhemoglobin 1.4 0.5 - 1.5 %   Methemoglobin 1.2 0.0 - 1.5 %  Magnesium     Status: None   Collection Time: 07/04/18 10:12 AM  Result Value Ref Range   Magnesium 1.8 1.7 - 2.4 mg/dL    Comment: Performed at [facility], [place] [address] [place], [place] [address]  Phosphorus     Status: Abnormal   Collection Time: 07/04/18 10:12 AM  Result Value Ref Range   Phosphorus 5.4 (H) 2.5 - 4.6 mg/dL    Comment: Performed at [facility] [address], [place], [place] [address]    Dg Chest Port 1 View  Result Date: 07/04/2018 CLINICAL DATA:  Respiratory failure.  History of sepsis. EXAM: PORTABLE CHEST 1 VIEW COMPARISON:  July 03, 2018 FINDINGS: The ETT is in good position. The NG tube terminates below today's study. Infiltrate in the right upper lung persists. New infiltrate in the right base. Atelectasis in the left base. Stable cardiomegaly. The hila and mediastinum are unchanged. No pneumothorax. Stable right Port-A-Cath. No acute abnormalities otherwise seen. IMPRESSION: 1. Persistent infiltrate in the right upper lobe and new infiltrate in the right lower lobe suggestive of multifocal pneumonia. Recommend follow-up to resolution. 2. Support apparatus above. Electronically Signed   By: Dorise Bullion III M.D   On: 07/04/2018 10:19   Dg Chest Port 1 View  Result Date: 07/03/2018 CLINICAL DATA:  Encounter for endotracheal tube placement. Difficult airway for inhibition. EXAM: PORTABLE CHEST 1 VIEW COMPARISON:  07/03/2017 at 0905 hours. FINDINGS: Endotracheal tube is roughly 4 cm above the carina. Nasogastric tube extends into the abdomen. There is new extensive airspace disease in the right upper lung. Right subclavian central line has been placed and tip in the lower SVC region. Negative for a pneumothorax. Again noted are densities at left lung base with a surgical device and history of a left ventricle to aortic conduit. Hazy densities in left hilum. IMPRESSION: 1. Extensive new airspace disease in the right upper lung. Findings concerning for aspiration based on the history of difficult intubation. 2. Endotracheal tube is appropriately positioned. 3. Right subclavian line in the lower SVC. Negative for pneumothorax. 4. Stable postsurgical changes in left lower chest. Electronically Signed   By: Markus Daft M.D.   On: 07/03/2018 15:23   Dg Chest Port 1 View  Result Date: 07/03/2018 CLINICAL DATA:  Shortness of breath EXAM: PORTABLE CHEST 1 VIEW COMPARISON:  July 02, 2018 FINDINGS: There is airspace consolidation in the left lower lobe region. Right lung is clear. There is cardiomegaly with pulmonary vascularity within normal limits. Patient is status post coronary artery bypass grafting. The peripherally placed valve replacement is within a surgically created aortic conduit, better appreciated on lateral view from several days prior. There is aortic atherosclerosis. No adenopathy evident. No bone lesions. IMPRESSION: Airspace consolidation concerning for pneumonia left lower lobe. Lungs elsewhere clear. Stable  cardiac silhouette. Valve replacement unusually peripherally located on the left is noted on lateral view to be within a surgically created aortic conduit. Valve position is stable.  There is aortic atherosclerosis. Aortic Atherosclerosis (ICD10-I70.0). Electronically Signed   By: Lowella Grip III M.D.   On: 07/03/2018 09:22    ROS Blood pressure (!) 83/52, pulse 93, temperature 99.9 F (37.7 C), resp. rate (!) 28, height _0  (1.778 m), weight 119.7 kg, SpO2 94 %. Physical Exam Physical Examination: General appearance - on vent , pale, obese,  Mental status - not responsive Eyes - funduscopic exam normal, discs flat and sharp, dysconjugate gaze Mouth - mucous membranes moist, pharynx normal without lesions Neck - adenopathy noted PCL Lymphatics - posterior cervical nodes Chest - decreased bs, rales in bases Heart - S1 and S2 normal, Reg now Abdomen - obese, striae, decreased bs, liver down 7-8 cm Neurological - nonresponsive , dysconjugate gaze Extremities - pedal edema 3 +, blue toes, R great toe gone , ulcer under 2nd R MTP Skin - pale, striae  Assessment/Plan: 1 AKI  Most likely hemodynamic with sepsis, cardiogenic components.  Asp pneu, ? Other source.  Vol xs, acidemia, ^ K. Needs CRRT, trial diuretics also.  2 CKD3 suspect vasc/DM 3 Hypertension: not an issue 4. Anemia appears chronic 5. CAD with pos enz per Cards 6 [place] sepsis and cardiogenic on pressors, still low limits ability to lower vol 7 PEA arrest 8 DM 9 Obesity 10 Aortic-LV conduit ? Could it be infected 11 AS 12 COPD 13 OSA 15 PVD 16 VDRF 17 Asp pneu 18 AFib/flutter amio P Line,CRRT, U/S urine chem.AB, vent, pressors, counseled wife  Mauricia Area 07/04/2018, 11:15 AM

## 2018-07-04 NOTE — Progress Notes (Signed)
CRITICAL VALUE ALERT  Critical Value:  Lactic acid 6.6  Date & Time Notied:  07/04/2018 0500  Provider Notified: Dr Deterding  Orders Received/Actions taken: none

## 2018-07-04 NOTE — Progress Notes (Signed)
Brief Nutrition Note  Consult received for enteral/tube feeding initiation and management.  Adult Enteral Nutrition Protocol initiated. Full assessment to follow.  Admitting Dx: Shortness of breath [R06.02] Wheezing [R06.2] Influenza B [J10.1] Sepsis, due to unspecified organism, unspecified whether acute organ dysfunction present ([place]) [A41.9]  Body mass index is 37.86 kg/m. Pt meets criteria for obesity based on current BMI.  Labs:  Recent Labs  Lab 07/02/18 0543 07/03/18 0259 07/03/18 0804 07/04/18 0226  NA 135 134*  --  135  K 5.3* 5.3*  --  5.8*  CL 103 102  --  100  CO2 15* 17*  --  17*  BUN 31* 43*  --  53*  CREATININE 1.85* 1.97*  --  2.32*  CALCIUM 7.4* 7.6*  --  7.1*  MG  --   --  2.0  --   PHOS  --   --  5.7*  --   GLUCOSE 119* 120*  --  179*    Clayton Bibles, MS, RD, LDN [facility] Clinical Dietitian Pager: [phone] After Hours Pager: [phone]

## 2018-07-04 NOTE — Progress Notes (Signed)
NAME:  Marcus Williams, MRN:  [id number], DOB:  09-06-1955, LOS: 2 ADMISSION DATE:  07/06/2018, CONSULTATION DATE: 07/03/2018 REFERRING MD: [facility], CHIEF COMPLAINT: Status post cardiac arrest PEA  Brief History   Presented 1/16 with fever and SOB. Admitted with flu B. Initially improving but then suffered PEA arrest 1/18. ROSC achieved after 12 minutes of CPR. Transferred to the ICU.   Past Medical History  Obesity  Cardiac arrest Coronary artery disease Diabetes mellitus Poor compliance COPD OSA Combined systolic and diastolic heart failure  S/p left ventricular apical aortic conduit @ [facility] 01/29/18 during which he had cardiac arrest requiring cardiac massage and epi while int he [facility]   07/03/2018 cardiac arrest  Consults:  Cardiology PCCM  Procedures:  1/18 intubation by anesthesia on the floor secondary to code 1/18  right subclavian central line  Significant Diagnostic Tests:  1/18 CXR (post intubation) >> RUL airspace disease, ?aspiration   Micro Data:  1/16 Blood cx >> NG x 2 days  Antimicrobials:  1/15 Vanc x 1 1/15 Cefepime x 1 1/15 Tamiflu >>   Interim history/subjective:  Intubated, sedated.   Objective   Blood pressure 91/66, pulse 95, temperature 98.9 F (37.2 C), temperature source Oral, resp. rate (!) 28, height 5\' 10"  (1.778 m), weight 119.7 kg, SpO2 100 %. CVP:  [17 mmHg-22 mmHg] 21 mmHg  Vent Mode: PRVC FiO2 (%):  [40 %-100 %] 50 % Set Rate:  [24 bmp-28 bmp] 28 bmp Vt Set:  [570 mL] 570 mL PEEP:  [5 cmH20] 5 cmH20 Plateau Pressure:  [25 cmH20-26 cmH20] 25 cmH20   Intake/Output Summary (Last 24 hours) at 07/04/2018 0630 Last data filed at 07/04/2018 0530 Gross per 24 hour  Intake 1737.55 ml  Output 775 ml  Net 962.55 ml   Filed Weights   07/03/18 0500 07/03/18 1500 07/04/18 0430  Weight: 120.2 kg 120 kg 119.7 kg    Examination:  General: Morbidly obese male, intubated & sedated HENT: ETT in place, +JVD Lungs:  Right rhonchi, left is clear, no wheezing  Cardiovascular: Heart sounds are regular, old midline surgical scar noted sternum Abdomen: Obese soft Extremities: 1-2+ pitting edema to the knees bilaterally, venous stasis changes GU: Foley in place, clear minimal output   Resolved Hospital Problem list     Assessment & Plan:   Acute Hypoxic Respiratory Failure: Intubated post PEA arrest, ROSC achieved after 12 minutes of CPR and 1 epinephrine. Initially admitted 1/16 for influenza B.  -- Full vent support -- Daily WUA / SBT  -- Continue tamiflu (day 5/5)  -- Fentanyl gtt for pain, RASS goal  -- Intermittent fentanyl & versed PRN   Hx of COPD: oxygen dependent -- Dulera BID -- Pulmicort BID -- Duoneb TID -- albuterol neb q2h prn   Shock: ? Cardiogenic vs septic. With persistent lactic acidosis requiring levophed post arrest. Afebrile but worsening leukocytosis. CVP 21 this morning.  -- No abx for now -- No fluids, diuresing  -- Add respiratory cultures -- F/u blood cultures -- Continue tamiflu  -- Levophed, goal MAP > 65  PEA Arrest 1/18 Hx CAD S/p Aortic left apical conduit on 01/29/18 (suffered PEA arrest in OR requiring cardiac massage and epi) Chronic Atrial Fibrillation: Rate controlled  Chronic Combined Systolic and Diastolic Heart Failure -- Cardiology consult -- Eliquis 5 BID -- No response to IV lasix; hold further diuresis for now, nephrology consult for possible CRRT -- Hold home metop while on pressors -- PRN IV  metop for tachycardia   AKI: Now hyperkalemic & oliguric despite lasix. Normal renal function at baseline. Volume overloaded on exam.  -- Nephro consult  -- Holding lasix   Diabetes mellitus -- SSI   Hyperkalemia: Mild, in the setting of AKI   -- Nephro consult; will add kayexalate if no CRRT   Transaminitis: Likely shock liver with cardiac arrest -- Trend daily labs   Hx of OSA: CPAP at home -- Currently full vent support   Chronic ulcer  secondary to poorly controlled diabetes -- Wound care  Best practice:  Diet: N.p.o. Pain/Anxiety/Delirium protocol (if indicated): Fentanyl Versed VAP protocol (if indicated): Yes DVT prophylaxis: Yes GI prophylaxis: PPI Glucose control: Sliding scale insulin Mobility: Bedrest Code Status: Full Family Communication: No family at bedside Disposition: Remain in ICU

## 2018-07-04 NOTE — Progress Notes (Signed)
Spoke with patient's wife, Uthman Mroczkowski, and updated her on his condition and plan for today. Unfortunately she has chronic medical issues that prevent her from visiting. She requests daily updates, cell [phone].   Velna Ochs, M.D. - PGY3 07/04/2018, 9:13 AM

## 2018-07-05 ENCOUNTER — Inpatient Hospital Stay (HOSPITAL_COMMUNITY): Payer: Medicare HMO

## 2018-07-05 DIAGNOSIS — J9621 Acute and chronic respiratory failure with hypoxia: Secondary | ICD-10-CM

## 2018-07-05 DIAGNOSIS — J9622 Acute and chronic respiratory failure with hypercapnia: Secondary | ICD-10-CM

## 2018-07-05 LAB — COMPREHENSIVE METABOLIC PANEL
ALT: 781 U/L — ABNORMAL HIGH (ref 0–44)
AST: 3623 U/L — ABNORMAL HIGH (ref 15–41)
Albumin: 2.7 g/dL — ABNORMAL LOW (ref 3.5–5.0)
Alkaline Phosphatase: 202 U/L — ABNORMAL HIGH (ref 38–126)
Anion gap: 20 — ABNORMAL HIGH (ref 5–15)
BUN: 36 mg/dL — ABNORMAL HIGH (ref 8–23)
CO2: 18 mmol/L — ABNORMAL LOW (ref 22–32)
Calcium: 7 mg/dL — ABNORMAL LOW (ref 8.9–10.3)
Chloride: 97 mmol/L — ABNORMAL LOW (ref 98–111)
Creatinine, Ser: 1.95 mg/dL — ABNORMAL HIGH (ref 0.61–1.24)
GFR calc Af Amer: 42 mL/min — ABNORMAL LOW (ref >60)
GFR, EST NON AFRICAN AMERICAN: 36 mL/min — AB (ref >60)
Glucose, Bld: 139 mg/dL — ABNORMAL HIGH (ref 70–99)
Potassium: 5.1 mmol/L (ref 3.5–5.1)
Sodium: 135 mmol/L (ref 135–145)
TOTAL PROTEIN: 5.8 g/dL — AB (ref 6.5–8.1)
Total Bilirubin: 5 mg/dL — ABNORMAL HIGH (ref 0.3–1.2)

## 2018-07-05 LAB — POCT ACTIVATED CLOTTING TIME
ACTIVATED CLOTTING TIME: 186 s
ACTIVATED CLOTTING TIME: 180 s
ACTIVATED CLOTTING TIME: 191 s
Activated Clotting Time: 186 seconds
Activated Clotting Time: 186 seconds
Activated Clotting Time: 186 seconds
Activated Clotting Time: 186 seconds
Activated Clotting Time: 191 seconds
Activated Clotting Time: 191 seconds
Activated Clotting Time: 191 seconds
Activated Clotting Time: 191 seconds
Activated Clotting Time: 191 seconds
Activated Clotting Time: 191 s
Activated Clotting Time: 197 s

## 2018-07-05 LAB — RENAL FUNCTION PANEL
Albumin: 2.5 g/dL — ABNORMAL LOW (ref 3.5–5.0)
Anion gap: 17 — ABNORMAL HIGH (ref 5–15)
BUN: 31 mg/dL — ABNORMAL HIGH (ref 8–23)
CO2: 22 mmol/L (ref 22–32)
Calcium: 6.7 mg/dL — ABNORMAL LOW (ref 8.9–10.3)
Chloride: 97 mmol/L — ABNORMAL LOW (ref 98–111)
Creatinine, Ser: 1.69 mg/dL — ABNORMAL HIGH (ref 0.61–1.24)
GFR calc Af Amer: 49 mL/min — ABNORMAL LOW (ref >60)
GFR calc non Af Amer: 43 mL/min — ABNORMAL LOW (ref >60)
GLUCOSE: 169 mg/dL — AB (ref 70–99)
Phosphorus: 2.9 mg/dL (ref 2.5–4.6)
Potassium: 4.5 mmol/L (ref 3.5–5.1)
Sodium: 136 mmol/L (ref 135–145)

## 2018-07-05 LAB — CBC
HCT: 26.7 % — ABNORMAL LOW (ref 39.0–52.0)
Hemoglobin: 7.9 g/dL — ABNORMAL LOW (ref 13.0–17.0)
MCH: 22.3 pg — AB (ref 26.0–34.0)
MCHC: 29.6 g/dL — ABNORMAL LOW (ref 30.0–36.0)
MCV: 75.2 fL — ABNORMAL LOW (ref 80.0–100.0)
PLATELETS: 170 10*3/uL (ref 150–400)
RBC: 3.55 MIL/uL — ABNORMAL LOW (ref 4.22–5.81)
RDW: 20.9 % — ABNORMAL HIGH (ref 11.5–15.5)
WBC: 10.3 10*3/uL (ref 4.0–10.5)
nRBC: 0.6 % — ABNORMAL HIGH (ref 0.0–0.2)

## 2018-07-05 LAB — POCT I-STAT 3, ART BLOOD GAS (G3+)
Acid-Base Excess: 1 mmol/L (ref 0.0–2.0)
Bicarbonate: 25.5 mmol/L (ref 20.0–28.0)
O2 Saturation: 100 %
PCO2 ART: 34.6 mmHg (ref 32.0–48.0)
Patient temperature: 35
TCO2: 27 mmol/L (ref 22–32)
pH, Arterial: 7.466 — ABNORMAL HIGH (ref 7.350–7.450)
pO2, Arterial: 398 mmHg — ABNORMAL HIGH (ref 83.0–108.0)

## 2018-07-05 LAB — MAGNESIUM
MAGNESIUM: 2 mg/dL (ref 1.7–2.4)
Magnesium: 2.1 mg/dL (ref 1.7–2.4)

## 2018-07-05 LAB — PROTIME-INR
INR: >10
Prothrombin Time: >90 seconds — ABNORMAL HIGH (ref 11.4–15.2)

## 2018-07-05 LAB — TROPONIN I
Troponin I: 1.55 ng/mL (ref <0.03)
Troponin I: 1.55 ng/mL (ref <0.03)

## 2018-07-05 LAB — GLUCOSE, CAPILLARY
Glucose-Capillary: 137 mg/dL — ABNORMAL HIGH (ref 70–99)
Glucose-Capillary: 137 mg/dL — ABNORMAL HIGH (ref 70–99)
Glucose-Capillary: 142 mg/dL — ABNORMAL HIGH (ref 70–99)

## 2018-07-05 LAB — APTT: aPTT: 110 seconds — ABNORMAL HIGH (ref 24–36)

## 2018-07-05 LAB — PHOSPHORUS
Phosphorus: 4.2 mg/dL (ref 2.5–4.6)
Phosphorus: 2.9 mg/dL (ref 2.5–4.6)

## 2018-07-05 MED ORDER — PRO-STAT SUGAR FREE PO LIQD
30.0000 mL | Freq: Three times a day (TID) | ORAL | Status: DC
  Administered 2018-07-05 – 2018-07-06 (×5): 30 mL
  Filled 2018-07-05 (×5): qty 30

## 2018-07-05 MED ORDER — VITAL HIGH PROTEIN PO LIQD
1000.0000 mL | ORAL | Status: DC
  Administered 2018-07-05 – 2018-07-06 (×2): 1000 mL

## 2018-07-05 MED ORDER — SODIUM CHLORIDE 0.9% FLUSH
10.0000 mL | Freq: Two times a day (BID) | INTRAVENOUS | Status: DC
  Administered 2018-07-05 – 2018-07-06 (×3): 10 mL

## 2018-07-05 MED ORDER — SODIUM CHLORIDE 0.9% FLUSH: 10.0000 mL | INTRAVENOUS | Status: DC | PRN

## 2018-07-05 MED ORDER — INSULIN ASPART 100 UNIT/ML ~~LOC~~ SOLN
1.0000 [IU] | SUBCUTANEOUS | Status: DC
  Administered 2018-07-05: 2 [IU] via SUBCUTANEOUS
  Administered 2018-07-05: 1 [IU] via SUBCUTANEOUS
  Administered 2018-07-05 – 2018-07-06 (×2): 2 [IU] via SUBCUTANEOUS
  Administered 2018-07-06: 3 [IU] via SUBCUTANEOUS
  Administered 2018-07-06: 2 [IU] via SUBCUTANEOUS

## 2018-07-05 MED ORDER — CHLORHEXIDINE GLUCONATE CLOTH 2 % EX PADS: 6.0000 | MEDICATED_PAD | Freq: Every day | CUTANEOUS | Status: DC

## 2018-07-05 MED ORDER — ARTIFICIAL TEARS OPHTHALMIC OINT
TOPICAL_OINTMENT | Freq: Three times a day (TID) | OPHTHALMIC | Status: DC
  Administered 2018-07-05 – 2018-07-07 (×7): via OPHTHALMIC
  Filled 2018-07-05: qty 3.5

## 2018-07-05 MED ORDER — ARTIFICIAL TEARS OPHTHALMIC OINT: TOPICAL_OINTMENT | Freq: Every evening | OPHTHALMIC | Status: DC | PRN

## 2018-07-05 NOTE — Progress Notes (Signed)
CRITICAL VALUE ALERT  Critical Value:  INR >10 PT>90  Date & Time Notied:  07/05/18 1240  Provider Notified: Velna Ochs MD  Orders Received/Actions taken: no orders given at this time.

## 2018-07-05 NOTE — Progress Notes (Signed)
Patel MD notifed about INR and PT elevation. Orders given to stop Heparin infusion on CRRT machine.

## 2018-07-05 NOTE — Progress Notes (Signed)
RN called due to sats dropping into the 40's during the bath. Upon arrival, RN was bagging patient, sats 64%. Took over bagging, getting sats to the mid 80's. MD gave orders to place patient on a PEEP of 10. Sats came up to 91%. Will obtain ABG in one hour and continue to monitor.

## 2018-07-05 NOTE — Progress Notes (Signed)
Spoke with patient's wife Katharine Look again today. Explained that patient is currently on maximal medical support unfortunately continues to worsen clinically. He remains ventilator dependent on multiple pressors for refractory shock now in acute renal and liver failure. Given his multiple medical co morbidities and poor functional status at baseline, prognosis is guarded. Explained that despite our efforts, there is a high likelihood that he may not survive. At this time she wishes to continue full aggressive care. We will continue current level of care and monitor closely for the next few days. Goals of care will need to be revisited if patient does not improve. We also discussed code status. Advised that patient would be unlikely to survive another cardiac arrest, and meaningful recovery in that situation would be limited. She would like him to remain FULL code. She is agreeable to meeting with palliative care for further discussion of goals of care. Consult placed.   Velna Ochs, M.D. - PGY3 Pager: [phone] 07/05/2018, 9:46 AM

## 2018-07-05 NOTE — Progress Notes (Signed)
On call Nephrologist called to clarify administration of scheduled IV Lasix (160mg ) since patient on three vasopressors (Levo, Vaso, and Giapreza). Patients CVP 15. Levo able to be titrated down to 16 mcg from 25-88mcg since the beginning of this shift, with current SBP greater than 100 and MAP greater 65. Order given to discontinue scheduled Lasix. Order carried out. Will continue to monitor.

## 2018-07-05 NOTE — Progress Notes (Signed)
While providing patient with routine hygiene (bed bath) and bed linen change, patients SpO2 dropped to 39 with laying flat and turning. Bed linen change unable to be completed as patient had to be placed back onto his back with head of bed elevated to greater than 30 degrees. Patient provided 100% FiO2 via ventilator but SpO2 unable to completely recover. Patient provided 100% FiO2 via bag valve device and respiratory therapy notified and updated. E-Link RN and On call MD notified and updated. Patients SpO2 increased to 85-89% via bag valve device and 100% FiO2. 2 mg Versed administered to assist with relaxing the patient post bed bath turn and making it easier to ventilate patient.  Orders received for STAT Chest Xray, and patients PEEP setting on ventilator to be increased from 5 to 10. Patient placed back on vent with adjusted PEEP settings by respiratory therapy. ABG to be collected in approximately one hour after being stabilized back on ventilator. Bed linen change unable to be completed; only one side of bed completed at this time. Will continue to monitor.

## 2018-07-05 NOTE — Progress Notes (Signed)
Patient ID: Marcus Williams, male   DOB: August 03, 1955, 63 y.o.   MRN: [id number] [facility] Progress Note   Assessment/ Plan:   1. Acute kidney injury on chronic kidney disease stage III: This appears to be associated with sepsis/ATN and he remains anuric.  Continue CRRT at this time without additional trial of diuretics given marginal response overnight with associated hypotension. 2.  Septic shock: Status post PEA arrest and current event likely associated with aspiration pneumonia or recovering from recent influenza infection.  Remains on pressors including angiotensin II. 3.  Aortic stenosis status post recent LV-aortic conduit 4.  Metabolic acidosis: Arterial blood gas showing some evidence of respiratory alkalosis, ventilator adjustments noted.  We will continue CRRT at current prescription. 5.  Ventilator dependent respiratory failure  Subjective:   Earlier this morning developed hypoxia requiring increasing PEEP/FiO2 on ventilator.  Now improving and being titrated down.   Objective:   BP (!) 93/59   Pulse 93   Temp (!) 95.7 F (35.4 C)   Resp (!) 28   Ht _0  (1.778 m)   Wt 121 kg   SpO2 100%   BMI 38.28 kg/m   Intake/Output Summary (Last 24 hours) at 07/05/2018 0746 Last data filed at 07/05/2018 0700 Gross per 24 hour  Intake 3916.47 ml  Output 2348 ml  Net 1568.47 ml   Weight change: 1 kg  Physical Exam: Gen: Intubated, unresponsive CVS: Pulse irregularly irregular, S1 and S2 normal  Resp: Anteriorly clear to auscultation, no rales/rhonchi Abd: Soft, obese, nontender Ext: 2-3+ lower extremity edema with mottled toes  Imaging: US Renal  Result Date: 07/04/2018 CLINICAL DATA:  63 year old male with acute kidney injury. EXAM: RENAL / URINARY TRACT ULTRASOUND COMPLETE COMPARISON:  05/22/2018 renal ultrasound. CT Abdomen and Pelvis 03/24/2017. FINDINGS: Right Kidney: Renal measurements: 12.2 x 6.6 x 6.6 centimeters = volume: 278 mL (stable). Echogenicity  within normal limits. No mass or hydronephrosis visualized. Left Kidney: Not as well visualized as the right kidney. Renal measurements: 11.3 x 7.0 x 5.5 centimeters = volume: 227 mL (not significantly changed). Echogenicity within normal limits. No mass or hydronephrosis visualized. Bladder: Could not be visualized due to bowel gas. Other findings: Small volume ascites appears similar since December. Echogenic liver and nodular liver contour (images 25 and 26). IMPRESSION: 1. Kidneys appear stable since December and within normal limits. Bladder could not be identified. 2. Cirrhotic appearing liver with small volume ascites similar to December. Electronically Signed   By: Genevie Ann M.D.   On: 07/04/2018 13:53   Dg Chest Port 1 View  Result Date: 07/05/2018 CLINICAL DATA:  Initial evaluation for acute hypoxia, shortness of breath. EXAM: PORTABLE CHEST 1 VIEW COMPARISON:  Prior radiograph from 07/04/2018. FINDINGS: Endotracheal tube in place with tip position well above the carina. Enteric tube courses into the abdomen. Right subclavian approach central venous catheter in place with tip overlying the distal SVC, stable. 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 Lungs hypoinflated. Previously seen right upper lobe infiltrate improved from previous. Right lower lobe infiltrate persists but also improved. Dense opacity within the retrocardiac left lower lobe similar to previous, and could reflect atelectasis or consolidation. Small bilateral pleural effusions. Perihilar vascular in diffuse interstitial congestion. No pneumothorax. Osseous structures unchanged. IMPRESSION: 1. Support apparatus in satisfactory position. 2. Interval improvement in right upper and lower lobe infiltrates as compared to previous, suggesting improved pneumonia. 3. Persistent dense opacity within the retrocardiac left lower lobe, which could reflect atelectasis  or consolidation. 4. Underlying diffuse pulmonary  interstitial congestion with probable small bilateral pleural effusions. Electronically Signed   By: Jeannine Boga M.D.   On: 07/05/2018 03:53   Dg Chest Port 1 View  Result Date: 07/04/2018 CLINICAL DATA:  Respiratory failure.  History of sepsis. EXAM: PORTABLE CHEST 1 VIEW COMPARISON:  July 03, 2018 FINDINGS: The ETT is in good position. The NG tube terminates below today's study. Infiltrate in the right upper lung persists. New infiltrate in the right base. Atelectasis in the left base. Stable cardiomegaly. The hila and mediastinum are unchanged. No pneumothorax. Stable right Port-A-Cath. No acute abnormalities otherwise seen. IMPRESSION: 1. Persistent infiltrate in the right upper lobe and new infiltrate in the right lower lobe suggestive of multifocal pneumonia. Recommend follow-up to resolution. 2. Support apparatus above. Electronically Signed   By: Dorise Bullion III M.D   On: 07/04/2018 10:19   Dg Chest Port 1 View  Result Date: 07/03/2018 CLINICAL DATA:  Encounter for endotracheal tube placement. Difficult airway for inhibition. EXAM: PORTABLE CHEST 1 VIEW COMPARISON:  07/03/2017 at 0905 hours. FINDINGS: Endotracheal tube is roughly 4 cm above the carina. Nasogastric tube extends into the abdomen. There is new extensive airspace disease in the right upper lung. Right subclavian central line has been placed and tip in the lower SVC region. Negative for a pneumothorax. Again noted are densities at left lung base with a surgical device and history of a left ventricle to aortic conduit. Hazy densities in left hilum. IMPRESSION: 1. Extensive new airspace disease in the right upper lung. Findings concerning for aspiration based on the history of difficult intubation. 2. Endotracheal tube is appropriately positioned. 3. Right subclavian line in the lower SVC. Negative for pneumothorax. 4. Stable postsurgical changes in left lower chest. Electronically Signed   By: Markus Daft M.D.   On:  07/03/2018 15:23   Dg Chest Port 1 View  Result Date: 07/03/2018 CLINICAL DATA:  Shortness of breath EXAM: PORTABLE CHEST 1 VIEW COMPARISON:  July 02, 2018 FINDINGS: There is airspace consolidation in the left lower lobe region. Right lung is clear. There is cardiomegaly with pulmonary vascularity within normal limits. Patient is status post coronary artery bypass grafting. The peripherally placed valve replacement is within a surgically created aortic conduit, better appreciated on lateral view from several days prior. There is aortic atherosclerosis. No adenopathy evident. No bone lesions. IMPRESSION: Airspace consolidation concerning for pneumonia left lower lobe. Lungs elsewhere clear. Stable cardiac silhouette. Valve replacement unusually peripherally located on the left is noted on lateral view to be within a surgically created aortic conduit. Valve position is stable. There is aortic atherosclerosis. Aortic Atherosclerosis (ICD10-I70.0). Electronically Signed   By: Lowella Grip III M.D.   On: 07/03/2018 09:22    Labs: BMET Recent Labs  Lab 07/09/2018 0354 07/02/18 0543 07/03/18 0259 07/03/18 0804 07/04/18 0226 07/04/18 1012 07/04/18 1434 07/04/18 1705 07/04/18 2104 07/05/18 0421  NA 135 135 134*  --  135  --  136  --  132* 135  K 4.8 5.3* 5.3*  --  5.8*  --  5.7*  --  5.0 5.1  CL 101 103 102  --  100  --  102  --  96* 97*  CO2 19* 15* 17*  --  17*  --  13*  --  16* 18*  GLUCOSE 99 119* 120*  --  179*  --  125*  --  128* 139*  BUN 20 31* 43*  --  53*  --  54*  --  46* 36*  CREATININE 1.38* 1.85* 1.97*  --  2.32*  --  2.58*  --  2.29* 1.95*  CALCIUM 7.3* 7.4* 7.6*  --  7.1*  --  6.8*  --  6.7* 7.0*  PHOS  --   --   --  5.7*  --  5.4* 5.8* 5.0*  --  4.2   CBC Recent Labs  Lab 06/27/2018 0354 07/02/18 0543 07/03/18 0259 07/04/18 0226 07/05/18 0421  WBC 5.9 10.6* 12.5* 19.6* 10.3  NEUTROABS 4.4  --   --   --   --   HGB 8.7* 8.7* 8.9* 8.4* 7.9*  HCT 29.7* 30.1* 31.8*  29.2* 26.7*  MCV 75.8* 79.0* 77.8* 77.7* 75.2*  PLT 166 157 149* 187 170    Medications:    . budesonide  0.5 mg Nebulization BID  . chlorhexidine gluconate (MEDLINE KIT)  15 mL Mouth Rinse BID  . Chlorhexidine Gluconate Cloth  6 each Topical Daily  . famotidine  20 mg Per Tube Daily  . feeding supplement (PRO-STAT SUGAR FREE 64)  30 mL Per Tube BID  . feeding supplement (VITAL HIGH PROTEIN)  1,000 mL Per Tube Q24H  . hydrocortisone sodium succinate  50 mg Intravenous Q6H  . insulin aspart  0-15 Units Subcutaneous TID WC  . ipratropium-albuterol  3 mL Nebulization TID  . mouth rinse  15 mL Mouth Rinse 10 times per day  . pantoprazole sodium  40 mg Per Tube BID  . sodium chloride flush  10-40 mL Intracatheter Q12H  . sodium chloride flush  3 mL Intravenous Q12H  . sodium polystyrene  30 g Per Tube Once  . sucralfate  1 g Per Tube Q6H   Elmarie Shiley, MD 07/05/2018, 7:46 AM

## 2018-07-05 NOTE — Progress Notes (Signed)
eLink Physician-Brief Progress Note Patient Name: NOELLE HOOGLAND DOB: [birth year] MRN: [id number]   Date of Service  07/05/2018  HPI/Events of Note  PT was getting a bath and with turns became hypoxic requiring ambubagging.   eICU Interventions  Check CXR. Increase PEEP to 10, 100% FiO2.  Check ABG.     Intervention Category Major Interventions: Hypoxemia - evaluation and management  Elsie Lincoln 07/05/2018, 3:30 AM

## 2018-07-05 NOTE — Consult Note (Signed)
Consultation Note Date: 07/05/2018   Patient Name: Marcus Williams  DOB: 1956-01-06  MRN: [id number]  Age / Sex: 63 y.o., male  PCP: Marcus Crouch, MD Referring Physician: Chesley Mires, MD  Reason for Consultation: Establishing goals of care  HPI/Patient Profile: 63 y.o. male  with past medical history of systolic CHF (EF 27-06%), SVT, HTN, atrial fib, h/o left ventricular apical-aortic conduit when he had PEA arrest at [facility] resulting in trach placement (has since been decannulated), CVA, OSA on CPAP, COPD admitted on 06/17/2018 with influenza B. Had in hospital PEA arrest with 12 minutes CPR before ROSC. Continues on vent with vasopressors and CRRT but not improving.   Clinical Assessment and Goals of Care: I met today at [name]' bedside. He is sedated on vent and critically ill. Bilateral feet mottling. I met with his wife, Marcus Williams, and daughter, Marcus Williams. They have one other daughter, Marcus Williams, that joined via telephone for part of our conversation. We discussed [name]' current state with multiorgan failure and that his body is shutting down. They explain to me his multiple health issues and overall decline the past 6 months. He has been faced with end of life many times and he has always pulled through. Marcus Williams is struggling with setting any limitations (even no CPR) and that this means she is giving up on him. She says that one doctor told him "he has nine lives" but Marcus Williams also shares that she believes he is on his ninth life.   I discussed from a spiritual standpoint that we should consider no CPR recognizing that he will have nothing to gain from further CPR and that maybe if he codes again we should accept this as end of life and that it is just his time to go. Marcus Williams agrees but has also seen him bounce back so many times and has been called in because he is dying in the past. She keeps telling me that she just  keeps thinking that she is going to walk in and he will be sitting up in the chair. She does tell me that her biggest fear is that he will die alone. I reassured her that nursing will be there with him if family is not able. I spoke with RN about this as well. Marcus Williams would like to monitor over the next 24 hours. No decisions made today. Will revisit tomorrow.    Primary Decision Maker NEXT OF KIN wife [place]   - Full code - Wife struggling with reality of end of life as they have faced this many times and he has always bounced back  Code Status/Advance Care Planning:  Full code   Symptom Management:   Per PCCM, renal, cardiology  Palliative Prophylaxis:   Aspiration, Bowel Regimen, Delirium Protocol, Oral Care and Turn Reposition  Additional Recommendations (Limitations, Scope, Preferences):  Full Scope Treatment  Psycho-social/Spiritual:   Desire for further Chaplaincy support:yes  Additional Recommendations: Grief/Bereavement Support  Prognosis:   Hours - Days  Discharge  Planning: To Be Determined. Likely hospital death.       Primary Diagnoses: Present on Admission: . Influenza B . Chronic combined systolic (congestive) and diastolic (congestive) heart failure ([place]) . A-fib ([place]) . COPD (chronic obstructive pulmonary disease) ([place]) . HTN (hypertension) . Pressure injury of skin   I have reviewed the medical record, interviewed the patient and family, and examined the patient. The following aspects are pertinent.  Past Medical History:  Diagnosis Date  . A-fib ([place])    on Pradaxa  . Aortic stenosis, moderate 11/2015  . Arthritis   . CAD (coronary artery disease)   . Chronic combined systolic (congestive) and diastolic (congestive) heart failure ([place])   . COPD (chronic obstructive pulmonary disease) ([facility])    2L home O2  . Diabetes mellitus without complication ([place])    INSULIN DEPENDENT  . GERD (gastroesophageal reflux  disease)   . History of cardioversion   . Hypertension   . Influenza B 06/2018  . MI (myocardial infarction) ([place])   . OSA on CPAP   . Sepsis ([place])   . Stroke ([place])   . SVT (supraventricular tachycardia) ([facility])    Social History   Socioeconomic History  . Marital status: Married    Spouse name: Not on file  . Number of children: Not on file  . Years of education: Not on file  . Highest education level: Not on file  Occupational History  . Occupation: disabled  Social Needs  . Financial resource strain: Not on file  . Food insecurity:    Worry: Not on file    Inability: Not on file  . Transportation needs:    Medical: Not on file    Non-medical: Not on file  Tobacco Use  . Smoking status: Never Smoker  . Smokeless tobacco: Never Used  Substance and Sexual Activity  . Alcohol use: No  . Drug use: No  . Sexual activity: Yes  Lifestyle  . Physical activity:    Days per week: Not on file    Minutes per session: Not on file  . Stress: Not on file  Relationships  . Social connections:    Talks on phone: Not on file    Gets together: Not on file    Attends religious service: Not on file    Active member of club or organization: Not on file    Attends meetings of clubs or organizations: Not on file    Relationship status: Not on file  Other Topics Concern  . Not on file  Social History Narrative  . Not on file   Family History  Problem Relation Age of Onset  . Stroke Mother   . Heart attack Mother   . Hypertension Mother   . Heart attack Father   . Hypertension Father   . Heart attack Brother        #1  . Diabetes Brother        #1  . Heart disease Brother        #2  . Lung disease Brother        #2  . Hypertension Brother        #2  . Diabetes Brother        #2   Scheduled Meds: . artificial tears   Both Eyes Q8H  . budesonide  0.5 mg Nebulization BID  . chlorhexidine gluconate (MEDLINE KIT)  15 mL Mouth Rinse BID  . Chlorhexidine Gluconate Cloth  6  each Topical Daily  .  Chlorhexidine Gluconate Cloth  6 each Topical Daily  . famotidine  20 mg Per Tube Daily  . feeding supplement (Williams-STAT SUGAR FREE 64)  30 mL Per Tube TID  . hydrocortisone sodium succinate  50 mg Intravenous Q6H  . insulin aspart  1-3 Units Subcutaneous Q4H  . ipratropium-albuterol  3 mL Nebulization TID  . mouth rinse  15 mL Mouth Rinse 10 times per day  . pantoprazole sodium  40 mg Per Tube BID  . sodium chloride flush  10-40 mL Intracatheter Q12H  . sodium chloride flush  10-40 mL Intracatheter Q12H  . sodium chloride flush  3 mL Intravenous Q12H  . sucralfate  1 g Per Tube Q6H   Continuous Infusions: .  prismasol BGK 4/2.5 800 mL/hr at 07/05/18 1137  . sodium chloride    . sodium chloride Stopped (07/05/18 1009)  . angiotensin II (GIAPREZA) infusion 25 ng/kg/min (07/05/18 1100)  . cefTRIAXone (ROCEPHIN)  IV Stopped (07/05/18 0953)  . feeding supplement (VITAL HIGH PROTEIN)    . fentaNYL infusion INTRAVENOUS 250 mcg/hr (07/05/18 1100)  . heparin 10,000 units/ 20 mL infusion syringe 550 Units/hr (07/05/18 0655)  . norepinephrine (LEVOPHED) Adult infusion 8 mcg/min (07/05/18 1100)  . prismasol BGK 4/2.5 2,000 mL/hr at 07/05/18 1136  . sodium bicarbonate (isotonic) 1000 mL infusion 250 mL/hr at 07/05/18 1134  . sodium chloride 999 mL/hr at 07/04/18 1341  . vancomycin 166.7 mL/hr at 07/05/18 1100  . vasopressin (PITRESSIN) infusion - *FOR SHOCK* 0.03 Units/min (07/05/18 1100)   PRN Meds:.Place/Maintain arterial line **AND** sodium chloride, sodium chloride, acetaminophen **OR** acetaminophen, albuterol, docusate, fentaNYL, fentaNYL (SUBLIMAZE) injection, heparin, heparin, hydrOXYzine, midazolam, ondansetron **OR** ondansetron (ZOFRAN) IV, sodium chloride, sodium chloride flush, sodium chloride flush Allergies  Allergen Reactions  . Contrast Media [Iodinated Diagnostic Agents] Anaphylaxis and Shortness Of Breath    Hypoxia/hypotension - anaphylaxis occurred  despite premedication  . Versed [Midazolam] Shortness Of Breath and Other (See Comments)    Hypotension   Review of Systems  Unable to perform ROS: Acuity of condition    Physical Exam Vitals signs and nursing note reviewed.  Constitutional:      Appearance: He is ill-appearing.     Interventions: He is sedated and intubated.  Cardiovascular:     Rate and Rhythm: Normal rate.  Pulmonary:     Effort: No tachypnea, accessory muscle usage or respiratory distress. He is intubated.     Breath sounds: Rhonchi present.  Skin:    Comments: Bilateral feet mottled  Neurological:     Comments: Sedated on vent     Vital Signs: BP (!) 93/59   Pulse 90   Temp 97.6 F (36.4 C) (Axillary)   Resp (!) 26   Ht '5\' 10"'$  (1.778 m)   Wt 121 kg   SpO2 100%   BMI 38.28 kg/m  Pain Scale: CPOT   Pain Score: 0-No pain   SpO2: SpO2: 100 % O2 Device:SpO2: 100 % O2 Flow Rate: .O2 Flow Rate (L/min): 2 L/min  IO: Intake/output summary:   Intake/Output Summary (Last 24 hours) at 07/05/2018 1242 Last data filed at 07/05/2018 1100 Gross per 24 hour  Intake 3783.65 ml  Output 2907 ml  Net 876.65 ml    LBM: Last BM Date: 06/30/18 Baseline Weight: Weight: 108.9 kg Most recent weight: Weight: 121 kg     Palliative Assessment/Data:     Time In: 1230 Time Out: 1340 Time Total: 70 min Greater than 50%  of this time was spent counseling and  coordinating care related to the above assessment and plan.  Signed by: Vinie Sill, NP Palliative Medicine Team Pager # [phone] (M-F 8a-5p) Team Phone # [phone] (Nights/Weekends)

## 2018-07-05 NOTE — Progress Notes (Addendum)
Initial Nutrition Assessment  DOCUMENTATION CODES:   Obesity unspecified  INTERVENTION:    Vital High Protein at goal rate of 50 ml/h (1200 ml per day) and Prostat 30 ml TID   Provides 1500 kcals, 150 gm protein, 1003 ml free water daily  NUTRITION DIAGNOSIS:   Inadequate oral intake related to inability to eat as evidenced by NPO status   GOAL:   Provide needs based on ASPEN/SCCM guidelines  MONITOR:   Vent status, TF tolerance, Labs, Skin, Weight trends, I & O's  REASON FOR ASSESSMENT:   Consult Enteral/tube feeding initiation and management  ASSESSMENT:   63 yo Male who presented wtih fever and SOB. Admitted with flu B. Suffered PEA arrest 1/18 and transferred to ICU.   Patient is currently intubated on ventilator support Temp (24hrs), Avg:96.1 F (35.6 C), Min:93.6 F (34.2 C), Max:99.7 F (37.6 C)  RD unable to obtain nutrition history at this time. No nutrition problems identified PTA.  Vital High Protein formula started 1/19 via Adult TF Protocol. TF is currently infusing at goal rate of 40 ml/hr via OGT. Also receiving Prostat 30 ml BID via tube.  Pt with septic shock despite multiple pressors. In multiorgan failure including renal and liver failure.  CVVHD started today.  Palliative Medicine Team consulted. Labs & medications reviewed. CBG's [phone].  NUTRITION - FOCUSED PHYSICAL EXAM:  Unable to assess at this time  Diet Order:   Diet Order            Diet NPO time specified  Diet effective now             EDUCATION NEEDS:   Not appropriate for education at this time  Skin:  Skin Assessment: Reviewed RN Assessment  Last BM:  1/15   Intake/Output Summary (Last 24 hours) at 07/05/2018 1228 Last data filed at 07/05/2018 1100 Gross per 24 hour  Intake 3783.65 ml  Output 2907 ml  Net 876.65 ml   Height:   Ht Readings from Last 1 Encounters:  07/03/18 5\' 10"  (1.778 m)   Weight:   Wt Readings from Last 1 Encounters:   07/05/18 121 kg  06/25/2018          108 kg  Ideal Body Weight:  75.4 kg  BMI:  Body mass index is 38.28 kg/m.  Estimated Nutritional Needs:   Kcal:  2778-2423  Protein:  >/= 150 gm  Fluid:  per MD  Arthur Holms, RD, LDN Pager #: [phone] After-Hours Pager #: [phone]

## 2018-07-05 NOTE — Progress Notes (Signed)
eLink Physician-Brief Progress Note Patient Name: Marcus Williams DOB: January 22, 1956 MRN: [id number]   Date of Service  07/05/2018  HPI/Events of Note  Reviewed ABG results 7.466/34.6/398 on PEEP 10, FiO2 100%.    Pulse oximeter not correlating with ABG.  O2 sats on pulse ox at the ear only in the 80s at the time of ABG draw.   CXR also reviewed.  eICU Interventions  Pulse ox on the forehead picking up saturations consistent with ABG.  Titrate FiO2 down first before going down on the PEEP.     Intervention Category Intermediate Interventions: Other:  Marcus Williams 07/05/2018, 4:57 AM

## 2018-07-05 NOTE — Progress Notes (Signed)
Progress Note  Patient Name: Marcus Williams Date of Encounter: 07/05/2018  Primary Cardiologist:   Teodoro Spray, MD   Subjective   Intubated and sedated  Inpatient Medications    Scheduled Meds: . budesonide  0.5 mg Nebulization BID  . chlorhexidine gluconate (MEDLINE KIT)  15 mL Mouth Rinse BID  . Chlorhexidine Gluconate Cloth  6 each Topical Daily  . famotidine  20 mg Per Tube Daily  . feeding supplement (PRO-STAT SUGAR FREE 64)  30 mL Per Tube BID  . feeding supplement (VITAL HIGH PROTEIN)  1,000 mL Per Tube Q24H  . hydrocortisone sodium succinate  50 mg Intravenous Q6H  . insulin aspart  0-15 Units Subcutaneous TID WC  . ipratropium-albuterol  3 mL Nebulization TID  . mouth rinse  15 mL Mouth Rinse 10 times per day  . pantoprazole sodium  40 mg Per Tube BID  . sodium chloride flush  10-40 mL Intracatheter Q12H  . sodium chloride flush  3 mL Intravenous Q12H  . sodium polystyrene  30 g Per Tube Once  . sucralfate  1 g Per Tube Q6H   Continuous Infusions: .  prismasol BGK 4/2.5 800 mL/hr at 07/05/18 0311  . sodium chloride    . sodium chloride Stopped (07/05/18 0047)  . angiotensin II (GIAPREZA) infusion 13.924 ng/kg/min (07/05/18 0759)  . cefTRIAXone (ROCEPHIN)  IV Stopped (07/04/18 1048)  . fentaNYL infusion INTRAVENOUS 250 mcg/hr (07/05/18 0700)  . heparin 10,000 units/ 20 mL infusion syringe 550 Units/hr (07/05/18 0655)  . norepinephrine (LEVOPHED) Adult infusion 8 mcg/min (07/05/18 0802)  . prismasol BGK 4/2.5 2,000 mL/hr at 07/05/18 0600  . sodium bicarbonate (isotonic) 1000 mL infusion 250 mL/hr at 07/05/18 0758  . sodium chloride 999 mL/hr at 07/04/18 1341  . vancomycin    . vasopressin (PITRESSIN) infusion - *FOR SHOCK* Stopped (07/04/18 1609)   PRN Meds: Place/Maintain arterial line **AND** sodium chloride, sodium chloride, acetaminophen **OR** acetaminophen, albuterol, docusate, fentaNYL, fentaNYL (SUBLIMAZE) injection, fentaNYL (SUBLIMAZE) injection,  heparin, heparin, hydrOXYzine, midazolam, ondansetron **OR** ondansetron (ZOFRAN) IV, sodium chloride, sodium chloride flush   Vital Signs    Vitals:   07/05/18 0715 07/05/18 0730 07/05/18 0748 07/05/18 0749  BP:      Pulse: 95 93 94 94  Resp: (!) 28 (!) 28 (!) 28 (!) 28  Temp: (!) 95.5 F (35.3 C) (!) 95.7 F (35.4 C) (!) 97 F (36.1 C) (!) 94.1 F (34.5 C)  TempSrc:   Axillary   SpO2: 100% 100% 100% 100%  Weight:      Height:        Intake/Output Summary (Last 24 hours) at 07/05/2018 0834 Last data filed at 07/05/2018 0700 Gross per 24 hour  Intake 3633.4 ml  Output 2414 ml  Net 1219.4 ml   Filed Weights   07/03/18 1500 07/04/18 0430 07/05/18 0600  Weight: 120 kg 119.7 kg 121 kg    Telemetry    Regular rhythm - Personally Reviewed  ECG    N - Personally Reviewed  Physical Exam   GEN:   Intubated and sedated Neck:  18cm CVP Cardiac: RRR, distant heart sounds Respiratory:     Decreased breath sounds GI: Soft, nontender, non-distended  MS:   Severe edema; No deformity. Neuro:  Intubated and sedated.   Labs    Chemistry Recent Labs  Lab 07/03/18 0804 07/04/18 0226 07/04/18 1434 07/04/18 2104 07/05/18 0421  NA  --  135 136 132* 135  K  --  5.8* 5.7*  5.0 5.1  CL  --  100 102 96* 97*  CO2  --  17* 13* 16* 18*  GLUCOSE  --  179* 125* 128* 139*  BUN  --  53* 54* 46* 36*  CREATININE  --  2.32* 2.58* 2.29* 1.95*  CALCIUM  --  7.1* 6.8* 6.7* 7.0*  PROT 6.7 5.8*  --   --  5.8*  ALBUMIN 3.1* 2.9* 2.7*  --  2.7*  AST 1,203* 1,548*  --   --  3,623*  ALT 317* 394*  --   --  781*  ALKPHOS 176* 159*  --   --  202*  BILITOT 2.8* 4.0*  --   --  5.0*  GFRNONAA  --  29* 26* 29* 36*  GFRAA  --  34* 30* 34* 42*  ANIONGAP  --  18* 21* 20* 20*     Hematology Recent Labs  Lab 07/03/18 0259 07/04/18 0226 07/05/18 0421  WBC 12.5* 19.6* 10.3  RBC 4.09* 3.76* 3.55*  HGB 8.9* 8.4* 7.9*  HCT 31.8* 29.2* 26.7*  MCV 77.8* 77.7* 75.2*  MCH 21.8* 22.3* 22.3*    MCHC 28.0* 28.8* 29.6*  RDW 20.1* 20.4* 20.9*  PLT 149* 187 170    Cardiac Enzymes Recent Labs  Lab 07/03/18 2245 07/04/18 2104 07/05/18 0241  TROPONINI 0.72* 1.55* 1.55*   No results for input(s): TROPIPOC in the last 168 hours.   BNP Recent Labs  Lab 06/19/2018 0354  BNP 1,735.6*     DDimer No results for input(s): DDIMER in the last 168 hours.   Radiology    US Renal  Result Date: 07/04/2018 CLINICAL DATA:  63 year old male with acute kidney injury. EXAM: RENAL / URINARY TRACT ULTRASOUND COMPLETE COMPARISON:  05/22/2018 renal ultrasound. CT Abdomen and Pelvis 03/24/2017. FINDINGS: Right Kidney: Renal measurements: 12.2 x 6.6 x 6.6 centimeters = volume: 278 mL (stable). Echogenicity within normal limits. No mass or hydronephrosis visualized. Left Kidney: Not as well visualized as the right kidney. Renal measurements: 11.3 x 7.0 x 5.5 centimeters = volume: 227 mL (not significantly changed). Echogenicity within normal limits. No mass or hydronephrosis visualized. Bladder: Could not be visualized due to bowel gas. Other findings: Small volume ascites appears similar since December. Echogenic liver and nodular liver contour (images 25 and 26). IMPRESSION: 1. Kidneys appear stable since December and within normal limits. Bladder could not be identified. 2. Cirrhotic appearing liver with small volume ascites similar to December. Electronically Signed   By: Genevie Ann M.D.   On: 07/04/2018 13:53   Dg Chest Port 1 View  Result Date: 07/05/2018 CLINICAL DATA:  Initial evaluation for acute hypoxia, shortness of breath. EXAM: PORTABLE CHEST 1 VIEW COMPARISON:  Prior radiograph from 07/04/2018. FINDINGS: Endotracheal tube in place with tip position well above the carina. Enteric tube courses into the abdomen. Right subclavian approach central venous catheter in place with tip overlying the distal SVC, stable. 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 Lungs hypoinflated.  Previously seen right upper lobe infiltrate improved from previous. Right lower lobe infiltrate persists but also improved. Dense opacity within the retrocardiac left lower lobe similar to previous, and could reflect atelectasis or consolidation. Small bilateral pleural effusions. Perihilar vascular in diffuse interstitial congestion. No pneumothorax. Osseous structures unchanged. IMPRESSION: 1. Support apparatus in satisfactory position. 2. Interval improvement in right upper and lower lobe infiltrates as compared to previous, suggesting improved pneumonia. 3. Persistent dense opacity within the retrocardiac left lower lobe, which could reflect atelectasis or  consolidation. 4. Underlying diffuse pulmonary interstitial congestion with probable small bilateral pleural effusions. Electronically Signed   By: Jeannine Boga M.D.   On: 07/05/2018 03:53   Dg Chest Port 1 View  Result Date: 07/04/2018 CLINICAL DATA:  Respiratory failure.  History of sepsis. EXAM: PORTABLE CHEST 1 VIEW COMPARISON:  July 03, 2018 FINDINGS: The ETT is in good position. The NG tube terminates below today's study. Infiltrate in the right upper lung persists. New infiltrate in the right base. Atelectasis in the left base. Stable cardiomegaly. The hila and mediastinum are unchanged. No pneumothorax. Stable right Port-A-Cath. No acute abnormalities otherwise seen. IMPRESSION: 1. Persistent infiltrate in the right upper lobe and new infiltrate in the right lower lobe suggestive of multifocal pneumonia. Recommend follow-up to resolution. 2. Support apparatus above. Electronically Signed   By: Dorise Bullion III M.D   On: 07/04/2018 10:19   Dg Chest Port 1 View  Result Date: 07/03/2018 CLINICAL DATA:  Encounter for endotracheal tube placement. Difficult airway for inhibition. EXAM: PORTABLE CHEST 1 VIEW COMPARISON:  07/03/2017 at 0905 hours. FINDINGS: Endotracheal tube is roughly 4 cm above the carina. Nasogastric tube extends into  the abdomen. There is new extensive airspace disease in the right upper lung. Right subclavian central line has been placed and tip in the lower SVC region. Negative for a pneumothorax. Again noted are densities at left lung base with a surgical device and history of a left ventricle to aortic conduit. Hazy densities in left hilum. IMPRESSION: 1. Extensive new airspace disease in the right upper lung. Findings concerning for aspiration based on the history of difficult intubation. 2. Endotracheal tube is appropriately positioned. 3. Right subclavian line in the lower SVC. Negative for pneumothorax. 4. Stable postsurgical changes in left lower chest. Electronically Signed   By: Markus Daft M.D.   On: 07/03/2018 15:23   Dg Chest Port 1 View  Result Date: 07/03/2018 CLINICAL DATA:  Shortness of breath EXAM: PORTABLE CHEST 1 VIEW COMPARISON:  July 02, 2018 FINDINGS: There is airspace consolidation in the left lower lobe region. Right lung is clear. There is cardiomegaly with pulmonary vascularity within normal limits. Patient is status post coronary artery bypass grafting. The peripherally placed valve replacement is within a surgically created aortic conduit, better appreciated on lateral view from several days prior. There is aortic atherosclerosis. No adenopathy evident. No bone lesions. IMPRESSION: Airspace consolidation concerning for pneumonia left lower lobe. Lungs elsewhere clear. Stable cardiac silhouette. Valve replacement unusually peripherally located on the left is noted on lateral view to be within a surgically created aortic conduit. Valve position is stable. There is aortic atherosclerosis. Aortic Atherosclerosis (ICD10-I70.0). Electronically Signed   By: Lowella Grip III M.D.   On: 07/03/2018 09:22    Cardiac Studies   NA  Patient Profile     63 y.o. male with history of SVT, stroke, OSA on CPAP, CAD s/p CABG x 4, and afib, presents with DOE found to have Influenza B - cardiology  consulted for acute on chronic CHF.  Now post PEA arrest  Assessment & Plan    STATUS POST APO-AORTIC CONDUIT:    Complicated surgical course in a patient who was deemed not to be a candidate for more traditional surgery.  EF likely very reduced per bedside echo.  Discussed with critical care.  He is on maximal support at this point.  No new suggestions.  We will follow as needed.   CAD:  Elevated troponin consistent with acute events.  RESPIRATORY FAILURE:    Influenza B.  Shock. Supportive care.   Possible comfort care conversation per CCM.   ACUTE ON CHRONIC SYSTOLIC AND DIASTOLIC HF:    EF was  72% on admission but was said to be globally hypokinetic on bedside echo after code with severely reduced EF.  However further testing not likely to change the outcome.     ATRIAL FIB:  Regular rhythm post   AKI:   Multifactorial.   CVVHD.     PEA ARREST:   Now with refractory shock.   As above.    For questions or updates, please contact [place] Please consult [email or web address] for contact info under Cardiology/STEMI.   Signed, Minus Breeding, MD  07/05/2018, 8:34 AM

## 2018-07-05 NOTE — Plan of Care (Signed)
  Problem: Clinical Measurements: Goal: Will remain free from infection Outcome: Progressing Goal: Cardiovascular complication will be avoided Outcome: Progressing   Problem: Cardiac: Goal: Ability to achieve and maintain adequate cardiopulmonary perfusion will improve Outcome: Progressing   Problem: Respiratory: Goal: Ability to maintain a clear airway and adequate ventilation will improve Outcome: Progressing   Problem: Education: Goal: Knowledge of General Education information will improve Description Including pain rating scale, medication(s)/side effects and non-pharmacologic comfort measures Outcome: Not Progressing  Patient critically ill and unable to participate in educational lessons at this time. Unable to assess patients learning needs or outcomes.    Problem: Health Behavior/Discharge Planning: Goal: Ability to manage health-related needs will improve Outcome: Not Progressing  Patient critically ill and unable to independently manage health related needs at this time.   Problem: Clinical Measurements: Goal: Ability to maintain clinical measurements within normal limits will improve Outcome: Not Progressing  Patient unable to maintain SpO2 with increase activity, laying flat, or turning. Patient unable to maintain SpO2 if startled awake. Patient noted with a variable HR at times increasing to 130-140. Patient with diminished urine output.  Goal: Diagnostic test results will improve Outcome: Not Progressing Patient with increasing liver enzymes and elevated potassium level despite CRRT and calcium gluc.   Goal: Respiratory complications will improve Outcome: Not Progressing Patient noted to desat to 40 during turn to change bed linen. Patient required bag valve device and mechanical vent setting changes to maintain patients SpO2.    Problem: Skin Integrity: Goal: Risk for impaired skin integrity will decrease Outcome: Not Progressing   Patient with poor  mobility with multiple wounds present.   Problem: Activity: Goal: Capacity to carry out activities will improve Outcome: Not Progressing  Patients ability to carry out activities has not improved since admission.   Problem: Activity: Goal: Ability to tolerate increased activity will improve Outcome: Not Progressing  Patient is not able to tolerate increase activity without decreasing SpO2 levels.    Problem: Role Relationship: Goal: Method of communication will improve Outcome: Not Progressing  Patient unable to communicate at this time while on mechanical ventilation. Patient unable to follow commands, nod/gesture appropriately, or use written communication at this time.

## 2018-07-05 NOTE — Progress Notes (Addendum)
NAME:  Marcus Williams, MRN:  [id number], DOB:  [birth year], LOS: 3 ADMISSION DATE:  06/22/2018, CONSULTATION DATE: 07/03/2018 REFERRING MD: Triad, CHIEF COMPLAINT: Status post cardiac arrest PEA  Brief History   Presented 1/16 with fever and SOB. Admitted with flu B. Initially improving but then suffered PEA arrest 1/18. ROSC achieved after 12 minutes of CPR. Transferred to the ICU. Now with refractory septic shock despite multiple pressors, stress dose steroids, and multiorgan failure including anuric renal failure on CVVHD and acute liver failure.   Past Medical History  Obesity  Cardiac arrest Coronary artery disease Diabetes mellitus Poor compliance COPD OSA Combined systolic and diastolic heart failure  S/p left ventricular apical aortic conduit @ [facility] 01/29/18 during which he had cardiac arrest requiring cardiac massage and epi while int he OR. Underwent tracheostomy that hospitalization with decannulation.   Significant Hospital Events   07/03/2018 cardiac arrest 1/20 CVVHD started   Consults:  Cardiology PCCM  Procedures:  1/18 intubation by anesthesia on the floor secondary to code 1/18  right subclavian central line 1/19 HD cath   Significant Diagnostic Tests:  1/18 CXR (post intubation) >> RUL airspace disease, ?aspiration   Micro Data:  1/16 Blood cx >> NG x 4 days  1/19 Resp cx >> pending   Antimicrobials:  1/15 Vanc x 1 1/15 Cefepime x 1 1/15 Tamiflu >>   Interim history/subjective:  Intubated, sedated, and unresponsive. Anasarcic and jaundiced. Hypothermic this morning.   Objective   Blood pressure (!) 93/59, pulse (!) 135, temperature (!) 95 F (35 C), resp. rate (!) 28, height 5\' 10"  (0.321 m), weight 121 kg, SpO2 100 %. CVP:  [14 mmHg-22 mmHg] 17 mmHg  Vent Mode: PRVC FiO2 (%):  [40 %-100 %] 60 % Set Rate:  [28 bmp] 28 bmp Vt Set:  [570 mL] 570 mL PEEP:  [5 cmH20-10 cmH20] 10 cmH20 Plateau Pressure:  [22 cmH20-28 cmH20] 28 cmH20   Intake/Output  Summary (Last 24 hours) at 07/05/2018 2248 Last data filed at 07/05/2018 0700 Gross per 24 hour  Intake 3916.47 ml  Output 2233 ml  Net 1683.47 ml   Filed Weights   07/03/18 1500 07/04/18 0430 07/05/18 0600  Weight: 120 kg 119.7 kg 121 kg    Examination:  General: Morbidly obese male, intubated & sedated HENT: ETT in place, +JVD, scleral icterus. Well healed tracheostomy scar Lungs: Bilateral rhonchi, no wheezing  Cardiovascular: Heart sounds are regular, old midline surgical scar noted sternum Abdomen: Obese soft Extremities: 1-2+ pitting edema to the knees bilaterally, venous stasis changes GU: Foley in place, clear minimal output   Resolved Hospital Problem list     Assessment & Plan:   Acute Hypoxic Respiratory Failure: Intubated post PEA arrest, ROSC achieved after 12 minutes of CPR and 1 epinephrine. Initially admitted 1/16 for influenza B. Now with right upper and lower lobe PNA. Concern for MRSA given multiple soft tissue wounds.  -- Full vent support -- Daily WUA / SBT  -- Completed tamiflu 1/19 -- Continue ceftriaxone & vancomycin (day 2) -- Fentanyl gtt for pain, RASS goal  -- Intermittent fentanyl & versed PRN   Hx of COPD: oxygen dependent, 2L chronically  -- Dulera BID -- Pulmicort BID -- Duoneb TID -- albuterol neb q2h prn   Shock: Likely refractory septic shock with persistent lactic acidosis. Right sided PNA on CXR, cultures pending. CVP remains elevated, 17 this morning in the setting of new oliguric renal failure on CVVHD. Co-ox yesterday was 78%.  Hydrocortisone  and giaprezza added yesterday due to worsening shock despite levophed.   -- Continue ceftriaxone & vancomycin (day 2)  -- No fluids, diuresing  -- F/u respiratory cultures -- F/u blood cultures -- Hydrocortizone 50 mg q6h for refractory shock  -- Levophed, giapreza goal MAP > 65  Encephalopathy: Still on fentanyl gtt. was purposeful post arrest fighting vent. Now unresponsive.  -- Wean  fentanyl as able  -- Repeat neurologic exam once off sedation   PEA Arrest 1/18 Hx CAD S/p Aortic left apical conduit on 01/29/18 (suffered PEA arrest in OR requiring cardiac massage and epi) Chronic Atrial Fibrillation: Rate controlled  Chronic Combined Systolic and Diastolic Heart Failure -- Cardiology consult -- Eliquis 5 BID - holding with renal failure.  -- Failed lasix challenge; now on CVVHD -- Hold home metop while on pressors  AKI: Remains oliguric, started CVVHD 1/19 -- Nephro consult   Acute Liver Failure: LFTs continue to rise, renal ultrasound yesterday noted cirrhotic appearing liver with small volume ascites. Jaundiced today with Tbili of 5.  -- Daily labs   Diabetes mellitus -- SSI   Hx of OSA: CPAP at home -- Currently full vent support   Chronic ulcer secondary to poorly controlled diabetes -- Wound care  Best practice:  Diet: N.p.o. Pain/Anxiety/Delirium protocol (if indicated): Fentanyl Versed VAP protocol (if indicated): Yes DVT prophylaxis: SCDs; heparin today  GI prophylaxis: PPI Glucose control: Sliding scale insulin Mobility: Bedrest Code Status: Full Family Communication: Updated wife via telephone 1/19.  Disposition: Guarded prognosis. Will consult palliative care.   Velna Ochs, M.D. - PGY3 Pager: [phone] 07/05/2018, 7:46 AM

## 2018-07-06 ENCOUNTER — Inpatient Hospital Stay (HOSPITAL_COMMUNITY): Payer: Medicare HMO

## 2018-07-06 DIAGNOSIS — K7200 Acute and subacute hepatic failure without coma: Secondary | ICD-10-CM

## 2018-07-06 DIAGNOSIS — R0902 Hypoxemia: Secondary | ICD-10-CM

## 2018-07-06 DIAGNOSIS — J9601 Acute respiratory failure with hypoxia: Secondary | ICD-10-CM

## 2018-07-06 DIAGNOSIS — E1152 Type 2 diabetes mellitus with diabetic peripheral angiopathy with gangrene: Secondary | ICD-10-CM

## 2018-07-06 DIAGNOSIS — I509 Heart failure, unspecified: Secondary | ICD-10-CM

## 2018-07-06 DIAGNOSIS — Z794 Long term (current) use of insulin: Secondary | ICD-10-CM

## 2018-07-06 DIAGNOSIS — A419 Sepsis, unspecified organism: Secondary | ICD-10-CM

## 2018-07-06 DIAGNOSIS — Z7189 Other specified counseling: Secondary | ICD-10-CM

## 2018-07-06 DIAGNOSIS — K7291 Hepatic failure, unspecified with coma: Secondary | ICD-10-CM

## 2018-07-06 DIAGNOSIS — G931 Anoxic brain damage, not elsewhere classified: Secondary | ICD-10-CM

## 2018-07-06 DIAGNOSIS — Z515 Encounter for palliative care: Secondary | ICD-10-CM

## 2018-07-06 LAB — COMPREHENSIVE METABOLIC PANEL
ALK PHOS: 186 U/L — AB (ref 38–126)
ALT: 693 U/L — ABNORMAL HIGH (ref 0–44)
ALT: 679 U/L — ABNORMAL HIGH (ref 0–44)
AST: 2728 U/L — ABNORMAL HIGH (ref 15–41)
AST: 2171 U/L — ABNORMAL HIGH (ref 15–41)
Albumin: 2.3 g/dL — ABNORMAL LOW (ref 3.5–5.0)
Albumin: 2.3 g/dL — ABNORMAL LOW (ref 3.5–5.0)
Alkaline Phosphatase: 194 U/L — ABNORMAL HIGH (ref 38–126)
Anion gap: 15 (ref 5–15)
Anion gap: 13 (ref 5–15)
BUN: 27 mg/dL — ABNORMAL HIGH (ref 8–23)
BUN: 27 mg/dL — ABNORMAL HIGH (ref 8–23)
CALCIUM: 6.6 mg/dL — AB (ref 8.9–10.3)
CALCIUM: 6.6 mg/dL — AB (ref 8.9–10.3)
CO2: 25 mmol/L (ref 22–32)
CO2: 27 mmol/L (ref 22–32)
Chloride: 96 mmol/L — ABNORMAL LOW (ref 98–111)
Chloride: 96 mmol/L — ABNORMAL LOW (ref 98–111)
Creatinine, Ser: 1.54 mg/dL — ABNORMAL HIGH (ref 0.61–1.24)
Creatinine, Ser: 1.5 mg/dL — ABNORMAL HIGH (ref 0.61–1.24)
GFR calc Af Amer: 55 mL/min — ABNORMAL LOW (ref >60)
GFR calc Af Amer: 57 mL/min — ABNORMAL LOW (ref >60)
GFR calc non Af Amer: 49 mL/min — ABNORMAL LOW (ref >60)
GFR, EST NON AFRICAN AMERICAN: 48 mL/min — AB (ref >60)
Glucose, Bld: 194 mg/dL — ABNORMAL HIGH (ref 70–99)
Glucose, Bld: 224 mg/dL — ABNORMAL HIGH (ref 70–99)
Potassium: 4.5 mmol/L (ref 3.5–5.1)
Potassium: 4.3 mmol/L (ref 3.5–5.1)
Sodium: 136 mmol/L (ref 135–145)
Sodium: 136 mmol/L (ref 135–145)
Total Bilirubin: 6 mg/dL — ABNORMAL HIGH (ref 0.3–1.2)
Total Bilirubin: 6.2 mg/dL — ABNORMAL HIGH (ref 0.3–1.2)
Total Protein: 5.4 g/dL — ABNORMAL LOW (ref 6.5–8.1)
Total Protein: 5.3 g/dL — ABNORMAL LOW (ref 6.5–8.1)

## 2018-07-06 LAB — GLUCOSE, CAPILLARY
GLUCOSE-CAPILLARY: 166 mg/dL — AB (ref 70–99)
Glucose-Capillary: 153 mg/dL — ABNORMAL HIGH (ref 70–99)
Glucose-Capillary: 155 mg/dL — ABNORMAL HIGH (ref 70–99)
Glucose-Capillary: 159 mg/dL — ABNORMAL HIGH (ref 70–99)
Glucose-Capillary: 164 mg/dL — ABNORMAL HIGH (ref 70–99)
Glucose-Capillary: 167 mg/dL — ABNORMAL HIGH (ref 70–99)
Glucose-Capillary: 167 mg/dL — ABNORMAL HIGH (ref 70–99)
Glucose-Capillary: 172 mg/dL — ABNORMAL HIGH (ref 70–99)
Glucose-Capillary: 178 mg/dL — ABNORMAL HIGH (ref 70–99)
Glucose-Capillary: 197 mg/dL — ABNORMAL HIGH (ref 70–99)
Glucose-Capillary: 199 mg/dL — ABNORMAL HIGH (ref 70–99)
Glucose-Capillary: 210 mg/dL — ABNORMAL HIGH (ref 70–99)
Glucose-Capillary: 214 mg/dL — ABNORMAL HIGH (ref 70–99)
Glucose-Capillary: 218 mg/dL — ABNORMAL HIGH (ref 70–99)
Glucose-Capillary: 221 mg/dL — ABNORMAL HIGH (ref 70–99)
Glucose-Capillary: 223 mg/dL — ABNORMAL HIGH (ref 70–99)

## 2018-07-06 LAB — CULTURE, BLOOD (ROUTINE X 2)
Culture: NO GROWTH
Culture: NO GROWTH
Special Requests: ADEQUATE
Special Requests: ADEQUATE

## 2018-07-06 LAB — BLOOD GAS, ARTERIAL
ACID-BASE EXCESS: 5.1 mmol/L — AB (ref 0.0–2.0)
Bicarbonate: 27.9 mmol/L (ref 20.0–28.0)
Drawn by: 350431
FIO2: 0.4
LHR: 28 {breaths}/min
MECHVT: 570 mL
O2 Saturation: 98.6 %
PEEP: 8 cmH2O
PH ART: 7.539 — AB (ref 7.350–7.450)
Patient temperature: 98.6
pCO2 arterial: 32.8 mmHg (ref 32.0–48.0)
pO2, Arterial: 113 mmHg — ABNORMAL HIGH (ref 83.0–108.0)

## 2018-07-06 LAB — RENAL FUNCTION PANEL
Albumin: 2.3 g/dL — ABNORMAL LOW (ref 3.5–5.0)
Anion gap: 10 (ref 5–15)
BUN: 27 mg/dL — ABNORMAL HIGH (ref 8–23)
CO2: 28 mmol/L (ref 22–32)
CREATININE: 1.46 mg/dL — AB (ref 0.61–1.24)
Calcium: 6.6 mg/dL — ABNORMAL LOW (ref 8.9–10.3)
Chloride: 98 mmol/L (ref 98–111)
GFR calc Af Amer: 59 mL/min — ABNORMAL LOW (ref >60)
GFR calc non Af Amer: 51 mL/min — ABNORMAL LOW (ref >60)
Glucose, Bld: 240 mg/dL — ABNORMAL HIGH (ref 70–99)
Phosphorus: 2.5 mg/dL (ref 2.5–4.6)
Potassium: 4.1 mmol/L (ref 3.5–5.1)
Sodium: 136 mmol/L (ref 135–145)

## 2018-07-06 LAB — APTT: aPTT: 70 seconds — ABNORMAL HIGH (ref 24–36)

## 2018-07-06 LAB — CBC
HEMATOCRIT: 24.1 % — AB (ref 39.0–52.0)
Hemoglobin: 7.5 g/dL — ABNORMAL LOW (ref 13.0–17.0)
MCH: 23 pg — ABNORMAL LOW (ref 26.0–34.0)
MCHC: 31.1 g/dL (ref 30.0–36.0)
MCV: 73.9 fL — AB (ref 80.0–100.0)
Platelets: 135 10*3/uL — ABNORMAL LOW (ref 150–400)
RBC: 3.26 MIL/uL — ABNORMAL LOW (ref 4.22–5.81)
RDW: 21 % — ABNORMAL HIGH (ref 11.5–15.5)
WBC: 7.5 10*3/uL (ref 4.0–10.5)
nRBC: 0.7 % — ABNORMAL HIGH (ref 0.0–0.2)

## 2018-07-06 LAB — POCT I-STAT 3, ART BLOOD GAS (G3+)
Acid-Base Excess: 8 mmol/L — ABNORMAL HIGH (ref 0.0–2.0)
Bicarbonate: 32 mmol/L — ABNORMAL HIGH (ref 20.0–28.0)
O2 SAT: 98 %
PO2 ART: 96 mmHg (ref 83.0–108.0)
Patient temperature: 35.7
TCO2: 33 mmol/L — ABNORMAL HIGH (ref 22–32)
pCO2 arterial: 37.6 mmHg (ref 32.0–48.0)
pH, Arterial: 7.534 — ABNORMAL HIGH (ref 7.350–7.450)

## 2018-07-06 LAB — PHOSPHORUS: Phosphorus: 2.3 mg/dL — ABNORMAL LOW (ref 2.5–4.6)

## 2018-07-06 LAB — PROTIME-INR
INR: 8.67
Prothrombin Time: 69.9 seconds — ABNORMAL HIGH (ref 11.4–15.2)

## 2018-07-06 LAB — MAGNESIUM
Magnesium: 2.1 mg/dL (ref 1.7–2.4)
Magnesium: 2.1 mg/dL (ref 1.7–2.4)

## 2018-07-06 LAB — AMMONIA: Ammonia: 44 umol/L — ABNORMAL HIGH (ref 9–35)

## 2018-07-06 MED ORDER — SODIUM PHOSPHATES 45 MMOLE/15ML IV SOLN
10.0000 mmol | Freq: Once | INTRAVENOUS | Status: AC
  Administered 2018-07-06: 10 mmol via INTRAVENOUS
  Filled 2018-07-06: qty 3.33

## 2018-07-06 MED ORDER — PRISMASOL BGK 4/2.5 32-4-2.5 MEQ/L REPLACEMENT SOLN
Status: DC
  Administered 2018-07-06: 1 via INTRAVENOUS_CENTRAL
  Filled 2018-07-06 (×5): qty 5000

## 2018-07-06 MED ORDER — INSULIN REGULAR(HUMAN) IN NACL 100-0.9 UT/100ML-% IV SOLN
INTRAVENOUS | Status: DC
  Administered 2018-07-06: 1.6 [IU]/h via INTRAVENOUS
  Filled 2018-07-06: qty 100

## 2018-07-06 MED ORDER — LACTULOSE 10 GM/15ML PO SOLN
20.0000 g | Freq: Three times a day (TID) | ORAL | Status: DC
  Administered 2018-07-06 (×3): 20 g
  Filled 2018-07-06 (×2): qty 30

## 2018-07-06 NOTE — Progress Notes (Signed)
Pt continues to desat to 80%. FiO2 increased to 50% and PEEP increased to 10. Dr. Nelda Marseille notified. No new orders received at this time.

## 2018-07-06 NOTE — Progress Notes (Addendum)
NAME:  Marcus Williams, MRN:  [id number], DOB:  [birth year], LOS: 4 ADMISSION DATE:  07/15/2018, CONSULTATION DATE: 07/03/2018 REFERRING MD: Triad, CHIEF COMPLAINT: Status post cardiac arrest PEA  Brief History   Presented 63/16 with fever and SOB. Admitted with flu B. Initially improving but then suffered PEA arrest 63/18. ROSC achieved after 12 minutes of 63 CPR. Transferred to the ICU. Now with refractory septic shock despite multiple pressors, stress dose steroids, and multiorgan failure including anuric renal failure on CVVHD and acute liver failure.   Past Medical History  Obesity  Cardiac arrest Coronary artery disease Diabetes mellitus Poor compliance COPD with chronic 2 L oxygen requirement  OSA Combined systolic and diastolic heart failure  S/p left ventricular apical aortic conduit @ [facility] 01/29/18 during which he had cardiac arrest requiring cardiac massage and epi while in the OR. Underwent tracheostomy that hospitalization with decannulation.   Significant Hospital Events   1/16 admission  07/03/2018 cardiac arrest 1/20 CVVHD started   Consults:  Cardiology PCCM  Procedures:  1/18 intubation by anesthesia on the floor secondary to code 1/18  right subclavian central line 1/19 HD cath   Significant Diagnostic Tests:  1/18 CXR (post intubation) >> RUL airspace disease, ?aspiration   Micro Data:  1/16 Blood cx >> NG x 4 days  1/19 Resp cx >> NG x 1 day  1/18 MRSA -   Antimicrobials:  1/15 Vanc x 1 1/15 Cefepime x 1 1/15 Tamiflu >>   Interim history/subjective:  Overnight respiratory rate was reduced for hypocarbia  INR and PT were elevated so heparin was stopped   Objective   Blood pressure (!) 92/59, pulse 97, temperature 98.9 F (37.2 C), temperature source Oral, resp. rate (!) 28, height 5\' 10"  (1.778 m), weight 118.7 kg, SpO2 100 %. CVP:  [15 mmHg-22 mmHg] 18 mmHg  Vent Mode: PRVC FiO2 (%):  [40 %-50 %] 40 % Set Rate:  [28 bmp] 28 bmp Vt Set:  [570 mL] 570  mL PEEP:  [8 cmH20-10 cmH20] 8 cmH20 Plateau Pressure:  [20 cmH20-28 cmH20] 20 cmH20   Intake/Output Summary (Last 24 hours) at 07/06/2018 0703 Last data filed at 07/06/2018 0600 Gross per 24 hour  Intake 9151.96 ml  Output 3367 ml  Net 5784.96 ml   Filed Weights   07/04/18 0430 07/05/18 0600 07/06/18 0406  Weight: 119.7 kg 121 kg 118.7 kg    Examination: General: critically ill appearing  HENT: ETT, OG tube  Lungs: clear to auscultation, no wheezes or rhonchi  Cardiovascular: regular rate and rhythm, S1 murmur loudest over RUSB  Abdomen: Soft, non tender,non distended Extremities: 1+ edema in the upper and lower extremities, warm  Neuro: does not open eyes to voice or painful stimulus GU: foley   Resolved Hospital Problem list     Assessment & Plan:   Refractory Shock - likely septic  Pneumonia  Persistent lactic acidosis - continue ceftriaxone  - ? DC vanc  - levophed, giaprezza for MAP goal > 65  - hydrocortisone  - follow up respiratory and blood cultures   Acute Hypoxic Respiratory Failure - intubated post PEA arrest Influenza B  Right upper and lower lobe Pneumonia  ? MRSA - multiple soft tissue wounds  - Full vent support  - Completed tamiflu  - Intermittent fentanyl and versed PRN  - Daily WUA/SBT   Encephalopathy  - Wean fentanyl as tolerated   PEA arrest 1/18  Hx of CAD  Sp aortic left apical conduit  Chronic atrial  fibrillation - rate controlled  Chronic combined systolic and diastolic heart failure  Acute renal failure  - Cardiology consulted  - Diuresing with CVVHD  - holding eliquis for renal failure  - holding home metoprolol   Acute hepatic failure  Transaminase and bilirubin continue to rise.  -continue to trend labs   Anemia  - trend hemoglobin  - transfuse for Hgb < 7   Hx of COPD  - dulera BID, pulmicort BID, Duoneb TID, albuterol neb q2h PRN   Diabetes  Chronic diabetic ulcer  - CBG monitoring and SSI  - wound care   Hx  of OSA ( on CPAP at home)  - Full vent support   Best practice:  Diet: NPO  Pain/Anxiety/Delirium protocol (if indicated): Fentanyl, verssed  VAP protocol (if indicated): Yes  DVT prophylaxis: SCDs Heparin  GI prophylaxis: PPI  Glucose control: SSI  Mobility: Bedrest  Code Status: Full  Family Communication: updated wife 1/20  Disposition: Guarded, palliative care consulted   Labs   CBC: Recent Labs  Lab 06/22/2018 0354 07/02/18 0543 07/03/18 0259 07/04/18 0226 07/05/18 0421 07/06/18 0408  WBC 5.9 10.6* 12.5* 19.6* 10.3 7.5  NEUTROABS 4.4  --   --   --   --   --   HGB 8.7* 8.7* 8.9* 8.4* 7.9* 7.5*  HCT 29.7* 30.1* 31.8* 29.2* 26.7* 24.1*  MCV 75.8* 79.0* 77.8* 77.7* 75.2* 73.9*  PLT 166 157 149* 187 170 135*    Basic Metabolic Panel: Recent Labs  Lab 07/04/18 1012 07/04/18 1434 07/04/18 1705 07/04/18 2104 07/05/18 0421 07/05/18 1607 07/06/18 0408  NA  --  136  --  132* 135 136 136  K  --  5.7*  --  5.0 5.1 4.5 4.5  CL  --  102  --  96* 97* 97* 96*  CO2  --  13*  --  16* 18* 22 25  GLUCOSE  --  125*  --  128* 139* 169* 194*  BUN  --  54*  --  46* 36* 31* 27*  CREATININE  --  2.58*  --  2.29* 1.95* 1.69* 1.54*  CALCIUM  --  6.8*  --  6.7* 7.0* 6.7* 6.6*  MG 1.8  --  2.0  --  2.0 2.1 2.1  PHOS 5.4* 5.8* 5.0*  --  4.2 2.9  2.9 2.3*   GFR: Estimated Creatinine Clearance: 64.2 mL/min (A) (by C-G formula based on SCr of 1.54 mg/dL (H)). Recent Labs  Lab 07/03/18 0259  07/03/18 1013 07/04/18 0226 07/04/18 0825 07/04/18 1335 07/05/18 0421 07/06/18 0408  WBC 12.5*  --   --  19.6*  --   --  10.3 7.5  LATICACIDVEN  --    < > 7.0* 6.6* 7.8* 8.7*  --   --    < > = values in this interval not displayed.    Liver Function Tests: Recent Labs  Lab 06/18/2018 1340 07/03/18 0804 07/04/18 0226 07/04/18 1434 07/05/18 0421 07/05/18 1607 07/06/18 0408  AST 141* 1,203* 1,548*  --  3,623*  --  2,728*  ALT 48* 317* 394*  --  781*  --  693*  ALKPHOS 105 176* 159*   --  202*  --  194*  BILITOT 1.8* 2.8* 4.0*  --  5.0*  --  6.0*  PROT 6.3* 6.7 5.8*  --  5.8*  --  5.4*  ALBUMIN 2.7* 3.1* 2.9* 2.7* 2.7* 2.5* 2.3*   No results for input(s): LIPASE, AMYLASE in the last  168 hours. No results for input(s): AMMONIA in the last 168 hours.  ABG    Component Value Date/Time   PHART 7.539 (H) 07/06/2018 0405   PCO2ART 32.8 07/06/2018 0405   PO2ART 113 (H) 07/06/2018 0405   HCO3 27.9 07/06/2018 0405   TCO2 27 07/05/2018 0433   ACIDBASEDEF 4.9 (H) 07/04/2018 2120   O2SAT 98.6 07/06/2018 0405     Coagulation Profile: Recent Labs  Lab 07/05/18 1053 07/06/18 0408  INR >10.00* 8.67*    Cardiac Enzymes: Recent Labs  Lab 07/03/18 2245 07/04/18 1434 07/04/18 2104 07/05/18 0241 07/05/18 0811  CKTOTAL  --  711*  --   --   --   TROPONINI 0.72*  --  1.55* 1.55* 1.55*    HbA1C: Hemoglobin A1C  Date/Time Value Ref Range Status  08/23/2012 04:40 AM 10.1 (H) 4.2 - 6.3 % Final    Comment:    The American Diabetes Association recommends that a primary goal of therapy should be <7% and that physicians should reevaluate the treatment regimen in patients with HbA1c values consistently >8%.    Hgb A1c MFr Bld  Date/Time Value Ref Range Status  05/20/2018 04:20 AM 6.6 (H) 4.8 - 5.6 % Final    Comment:    (NOTE) Pre diabetes:          5.7%-6.4% Diabetes:              >6.4% Glycemic control for   <7.0% adults with diabetes   12/03/2017 08:00 PM 7.5 (H) 4.8 - 5.6 % Final    Comment:    (NOTE) Pre diabetes:          5.7%-6.4% Diabetes:              >6.4% Glycemic control for   <7.0% adults with diabetes     CBG: Recent Labs  Lab 07/05/18 1150 07/05/18 1604 07/05/18 2023 07/06/18 0037 07/06/18 0414  GLUCAP 142* 153* 167* 178* 199*    Review of Systems:     Past Medical History  He,  has a past medical history of A-fib ([place]), Aortic stenosis, moderate (11/2015), Arthritis, CAD (coronary artery disease), Chronic combined systolic  (congestive) and diastolic (congestive) heart failure (HCC), COPD (chronic obstructive pulmonary disease) ([place]), Diabetes mellitus without complication ([place]), GERD (gastroesophageal reflux disease), History of cardioversion, Hypertension, Influenza B (06/2018), MI (myocardial infarction) ([place]), OSA on CPAP, Sepsis ([place]), Stroke ([place]), and SVT (supraventricular tachycardia) ([place]).   Surgical History    Past Surgical History:  Procedure Laterality Date  . 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 BUNIONECTOMY    . CARDIAC CATHETERIZATION N/A 10/02/2015   Procedure: Coronary/Grafts Angiography;  Surgeon: Teodoro Spray, MD;  Location: [place] [facility];  Service: Cardiovascular;  Laterality: N/A;  . CARDIAC CATHETERIZATION N/A 11/21/2015   Procedure: Right and Left Heart Cath;  Surgeon: Sherren Mocha, MD;  Location: [place] [facility];  Service: Cardiovascular;  Laterality: N/A;  . CORONARY STENT INTERVENTION N/A 07/30/2017   Procedure: CORONARY STENT INTERVENTION;  Surgeon: Isaias Cowman, MD;  Location: [place] [facility];  Service: Cardiovascular;  Laterality: N/A;  . CORONARY STENT INTERVENTION N/A 10/23/2017   Procedure: CORONARY STENT INTERVENTION;  Surgeon: Yolonda Kida, MD;  Location: [place] [facility];  Service: Cardiovascular;  Laterality: N/A;  . KNEE SURGERY    . LEFT HEART CATH AND CORONARY ANGIOGRAPHY N/A 10/30/2017  Procedure: LEFT HEART CATH AND CORONARY ANGIOGRAPHY;  Surgeon: Teodoro Spray, MD;  Location: [place] [facility];  Service: Cardiovascular;  Laterality: N/A;  . LEFT HEART CATH AND CORS/GRAFTS ANGIOGRAPHY N/A 10/22/2017   Procedure: LEFT HEART CATH AND CORS/GRAFTS ANGIOGRAPHY;  Surgeon: Corey Skains, MD;  Location: [place] [facility];  Service: Cardiovascular;  Laterality: N/A;  . RIGHT/LEFT HEART CATH AND CORONARY/GRAFT ANGIOGRAPHY N/A  07/30/2017   Procedure: RIGHT/LEFT HEART CATH AND CORONARY/GRAFT ANGIOGRAPHY;  Surgeon: Isaias Cowman, MD;  Location: [place] [facility];  Service: Cardiovascular;  Laterality: N/A;  . TEE WITHOUT CARDIOVERSION N/A 11/21/2015   Procedure: TRANSESOPHAGEAL ECHOCARDIOGRAM (TEE);  Surgeon: Larey Dresser, MD;  Location: [place];  Service: Cardiovascular;  Laterality: N/A;  . TRACHEOSTOMY    . TRACHEOSTOMY CLOSURE       Social History   reports that he has never smoked. He has never used smokeless tobacco. He reports that he does not drink alcohol or use drugs.   Family History   His family history includes Diabetes in his brother and brother; Heart attack in his brother, father, and mother; Heart disease in his brother; Hypertension in his brother, father, and mother; Lung disease in his brother; Stroke in his mother.   Allergies Allergies  Allergen Reactions  . Contrast Media [Iodinated Diagnostic Agents] Anaphylaxis and Shortness Of Breath    Hypoxia/hypotension - anaphylaxis occurred despite premedication  . Versed [Midazolam] Shortness Of Breath and Other (See Comments)    Hypotension     Home Medications  Prior to Admission medications   Medication Sig Start Date End Date Taking? Authorizing Provider  acetaminophen (TYLENOL) 325 MG tablet Take 2 tablets (650 mg total) by mouth every 6 (six) hours as needed for moderate pain (headache). 05/27/18  Yes Gouru, Illene Silver, MD  albuterol (PROVENTIL HFA;VENTOLIN HFA) 108 (90 Base) MCG/ACT inhaler Inhale 2 puffs into the lungs every 6 (six) hours as needed for wheezing or shortness of breath. 07/11/15  Yes Kasa, Maretta Bees, MD  apixaban (ELIQUIS) 5 MG TABS tablet Take 5 mg by mouth 2 (two) times daily.   Yes [provider]  atorvastatin (LIPITOR) 40 MG tablet Take 1 tablet (40 mg total) by mouth daily at 6 PM. 10/24/17  Yes Gouru, Aruna, MD  DULoxetine (CYMBALTA) 20 MG capsule Take 20 mg by mouth daily.   Yes [provider]  famotidine (PEPCID) 20 MG tablet Take 1 tablet (20 mg total) by mouth 2 (two) times daily. 06/27/18  Yes Carrie Mew, MD  Fluticasone-Salmeterol (ADVAIR) 250-50 MCG/DOSE AEPB Inhale 1 puff into the lungs 2 (two) times daily. 11/30/17  Yes Gladstone Lighter, MD  guaiFENesin-codeine 100-10 MG/5ML syrup Take 5 mLs by mouth every 6 (six) hours as needed for cough. 06/30/18  Yes [provider]  ipratropium-albuterol (DUONEB) 0.5-2.5 (3) MG/3ML SOLN Take 3 mLs by nebulization 2 (two) times daily. 05/27/18  Yes Gouru, Illene Silver, MD  metFORMIN (GLUCOPHAGE) 1000 MG tablet Take 1,000 mg by mouth 2 (two) times daily with a meal.   Yes [provider]  metoprolol tartrate (LOPRESSOR) 25 MG tablet Take 1 tablet (25 mg total) by mouth 2 (two) times daily. 05/27/18  Yes Gouru, Aruna, MD  ondansetron (ZOFRAN-ODT) 4 MG disintegrating tablet Take 4 mg by mouth every 8 (eight) hours as needed for nausea/vomiting.   Yes [provider]  pantoprazole (PROTONIX) 40 MG tablet Take 1 tablet (40 mg total) by mouth 2 (two) times daily. Patient taking differently: Take 40 mg  by mouth daily.  12/05/17  Yes Gladstone Lighter, MD  polyethylene glycol (MIRALAX / GLYCOLAX) packet Take 17 g by mouth daily. Patient taking differently: Take 17 g by mouth daily as needed for mild constipation.  05/28/18  Yes Gouru, Illene Silver, MD  Potassium Chloride ER 20 MEQ TBCR Take 10 mEq by mouth daily. Patient taking differently: Take 20 mEq by mouth daily.  05/27/18  Yes Gouru, Illene Silver, MD  pramipexole (MIRAPEX) 0.5 MG tablet Take 0.5 mg by mouth 3 (three) times daily as needed.    Yes [provider]  senna (SENOKOT) 8.6 MG TABS tablet Take 1 tablet (8.6 mg total) by mouth daily as needed for mild constipation. 05/27/18  Yes Gouru, Illene Silver, MD  sucralfate (CARAFATE) 1 g tablet Take 1 tablet (1 g total) by mouth 4 (four) times daily. 06/27/18  Yes Carrie Mew, MD  torsemide (DEMADEX) 20 MG tablet Take 1  tablet (20 mg total) by mouth daily. 05/28/18  Yes Gouru, Illene Silver, MD  zolpidem (AMBIEN) 5 MG tablet Take 1 tablet (5 mg total) by mouth at bedtime as needed for sleep. 11/01/17  Yes Fritzi Mandes, MD  budesonide (PULMICORT) 0.5 MG/2ML nebulizer solution Take 2 mLs (0.5 mg total) by nebulization 2 (two) times daily. 05/27/18   [name], MD  clopidogrel (PLAVIX) 75 MG tablet Take 1 tablet (75 mg total) by mouth daily with breakfast. Patient not taking: Reported on 05/19/2018 08/01/17   Max Sane, MD  oxyCODONE (OXY IR/ROXICODONE) 5 MG immediate release tablet Take 1-2 tablets (5-10 mg total) by mouth every 6 (six) hours as needed for moderate pain, severe pain or breakthrough pain. Patient not taking: Reported on 07/06/2018 06/01/18   Dustin Flock, MD  sodium chloride (OCEAN) 0.65 % SOLN nasal spray Place 1 spray into both nostrils as needed for congestion. Patient not taking: Reported on 06/14/2018 05/27/18   [name], MD    Attending Note:  63 year old male with extensive PMH who presents to PCCM with respiratory failure post Flu B and cardiac arrest.  Patient has multiorgan system failure at this point.  Liver, renal, respiratory and heart failure.  On exam, he is unresponsive, not following commands with clear lungs.  I reviewed CXR myself, ETT is in a good position.  Discussed with PCCM-NP.  Family updated bedside, full code for now.  WIll order MRI and EEG.  Will check electrolytes.  Start lactulose and check ammonia level.    The patient is critically ill with multiple organ systems failure and requires high complexity decision making for assessment and support, frequent evaluation and titration of therapies, application of advanced monitoring technologies and extensive interpretation of multiple databases.   Critical Care Time devoted to patient care services described in this note is  33  Minutes. This time reflects time of care of this signee Dr Jennet Maduro. This critical care time  does not reflect procedure time, or teaching time or supervisory time of PA/NP/Med student/Med Resident etc but could involve care discussion time.  Rush Farmer, M.D. [facility] Pulmonary/Critical Care Medicine. Pager: [phone]. After hours pager: [phone].

## 2018-07-06 NOTE — Progress Notes (Signed)
Per Dr. Nelda Marseille request, pt placed on 100% FiO2 and PEEP of 10. With RN at bedside pt was laid flat. Pt unable to maintain SpO2 above 92% and desat to 79%. Pt sat back up. Once pt recovered he was placed back on original settings of 40% FiO2 and PEEP of 8. Physician aware.

## 2018-07-06 NOTE — Progress Notes (Signed)
Patient ID: Marcus Williams, male   DOB: 03-Aug-1955, 63 y.o.   MRN: [id number] [facility] Progress Note   Assessment/ Plan:   1. Acute kidney injury on chronic kidney disease stage III: Anuric/without renal recovery with pressor dependent hypotension.  Continue CRRT at this time with efforts at cautious fluid removal.  Overall poor prognosis. 2.  Septic shock: Status post PEA arrest and current event likely associated with aspiration pneumonia or recovering from recent influenza infection.  Weaning off of pressors gradually stop 3.  Aortic stenosis status post recent LV-aortic conduit 4.  Metabolic acidosis: Corrected with CRRT and this morning with evidence of mild respiratory alkalosis; repeat ABG pending and will decide on need to revise CRRT prescription. 5.  Ventilator dependent respiratory failure  Subjective:   Remains on pressors and overnight noted to have increased INR for which I discontinued his heparin yesterday so as not to contribute to any bleeding diathesis.  Respiratory alkalosis noted on labs this morning.   Objective:   BP (!) 92/59   Pulse 97   Temp 98.9 F (37.2 C) (Oral)   Resp (!) 28   Ht _0  (1.778 m)   Wt 118.7 kg   SpO2 100%   BMI 37.55 kg/m   Intake/Output Summary (Last 24 hours) at 07/06/2018 0755 Last data filed at 07/06/2018 0700 Gross per 24 hour  Intake 9823.85 ml  Output 3537 ml  Net 6286.85 ml   Weight change: -2.3 kg  Physical Exam: Gen: Intubated, unresponsive CVS: Pulse irregularly irregular, S1 and S2 normal  Resp: Anteriorly clear to auscultation, no rales/rhonchi Abd: Soft, obese, nontender Ext: 2+ lower extremity edema with mottled toes, 2-3+ pitting upper extremity edema  Imaging: US Renal  Result Date: 07/04/2018 CLINICAL DATA:  63 year old male with acute kidney injury. EXAM: RENAL / URINARY TRACT ULTRASOUND COMPLETE COMPARISON:  05/22/2018 renal ultrasound. CT Abdomen and Pelvis 03/24/2017. FINDINGS: Right Kidney:  Renal measurements: 12.2 x 6.6 x 6.6 centimeters = volume: 278 mL (stable). Echogenicity within normal limits. No mass or hydronephrosis visualized. Left Kidney: Not as well visualized as the right kidney. Renal measurements: 11.3 x 7.0 x 5.5 centimeters = volume: 227 mL (not significantly changed). Echogenicity within normal limits. No mass or hydronephrosis visualized. Bladder: Could not be visualized due to bowel gas. Other findings: Small volume ascites appears similar since December. Echogenic liver and nodular liver contour (images 25 and 26). IMPRESSION: 1. Kidneys appear stable since December and within normal limits. Bladder could not be identified. 2. Cirrhotic appearing liver with small volume ascites similar to December. Electronically Signed   By: Genevie Ann M.D.   On: 07/04/2018 13:53   Dg Chest Port 1 View  Result Date: 07/06/2018 CLINICAL DATA:  Intubation. EXAM: PORTABLE CHEST 1 VIEW COMPARISON:  07/05/2018. FINDINGS: Endotracheal tube, NG tube, right PICC line in stable position. Prior CABG. Cardiac valvular structure again noted projected over the periphery of the heart with adjacent postsurgical change consistent with aortic conduit. Cardiomegaly with diffuse bilateral pulmonary infiltrates. No pleural effusion or pneumothorax. IMPRESSION: 1.  Lines and tubes in stable position. 2. Cardiomegaly. Diffuse bilateral pulmonary infiltrates. Findings consistent with pulmonary edema and/or bilateral multifocal pneumonia. Stable postsurgical change. Electronically Signed   By: Marcello Moores  Register   On: 07/06/2018 07:14   Dg Chest Port 1 View  Result Date: 07/05/2018 CLINICAL DATA:  Initial evaluation for acute hypoxia, shortness of breath. EXAM: PORTABLE CHEST 1 VIEW COMPARISON:  Prior radiograph from 07/04/2018. FINDINGS: Endotracheal tube in  place with tip position well above the carina. Enteric tube courses into the abdomen. Right subclavian approach central venous catheter in place with tip  overlying the distal SVC, stable. 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 Lungs hypoinflated. Previously seen right upper lobe infiltrate improved from previous. Right lower lobe infiltrate persists but also improved. Dense opacity within the retrocardiac left lower lobe similar to previous, and could reflect atelectasis or consolidation. Small bilateral pleural effusions. Perihilar vascular in diffuse interstitial congestion. No pneumothorax. Osseous structures unchanged. IMPRESSION: 1. Support apparatus in satisfactory position. 2. Interval improvement in right upper and lower lobe infiltrates as compared to previous, suggesting improved pneumonia. 3. Persistent dense opacity within the retrocardiac left lower lobe, which could reflect atelectasis or consolidation. 4. Underlying diffuse pulmonary interstitial congestion with probable small bilateral pleural effusions. Electronically Signed   By: Jeannine Boga M.D.   On: 07/05/2018 03:53   Dg Chest Port 1 View  Result Date: 07/04/2018 CLINICAL DATA:  Respiratory failure.  History of sepsis. EXAM: PORTABLE CHEST 1 VIEW COMPARISON:  July 03, 2018 FINDINGS: The ETT is in good position. The NG tube terminates below today's study. Infiltrate in the right upper lung persists. New infiltrate in the right base. Atelectasis in the left base. Stable cardiomegaly. The hila and mediastinum are unchanged. No pneumothorax. Stable right Port-A-Cath. No acute abnormalities otherwise seen. IMPRESSION: 1. Persistent infiltrate in the right upper lobe and new infiltrate in the right lower lobe suggestive of multifocal pneumonia. Recommend follow-up to resolution. 2. Support apparatus above. Electronically Signed   By: Dorise Bullion III M.D   On: 07/04/2018 10:19    Labs: BMET Recent Labs  Lab 07/03/18 0259 07/03/18 0804 07/04/18 0226 07/04/18 1012 07/04/18 1434 07/04/18 1705 07/04/18 2104 07/05/18 0421 07/05/18 1607  07/06/18 0408  NA 134*  --  135  --  136  --  132* 135 136 136  K 5.3*  --  5.8*  --  5.7*  --  5.0 5.1 4.5 4.5  CL 102  --  100  --  102  --  96* 97* 97* 96*  CO2 17*  --  17*  --  13*  --  16* 18* 22 25  GLUCOSE 120*  --  179*  --  125*  --  128* 139* 169* 194*  BUN 43*  --  53*  --  54*  --  46* 36* 31* 27*  CREATININE 1.97*  --  2.32*  --  2.58*  --  2.29* 1.95* 1.69* 1.54*  CALCIUM 7.6*  --  7.1*  --  6.8*  --  6.7* 7.0* 6.7* 6.6*  PHOS  --  5.7*  --  5.4* 5.8* 5.0*  --  4.2 2.9  2.9 2.3*   CBC Recent Labs  Lab 06/27/2018 0354  07/03/18 0259 07/04/18 0226 07/05/18 0421 07/06/18 0408  WBC 5.9   < > 12.5* 19.6* 10.3 7.5  NEUTROABS 4.4  --   --   --   --   --   HGB 8.7*   < > 8.9* 8.4* 7.9* 7.5*  HCT 29.7*   < > 31.8* 29.2* 26.7* 24.1*  MCV 75.8*   < > 77.8* 77.7* 75.2* 73.9*  PLT 166   < > 149* 187 170 135*   < > = values in this interval not displayed.    Medications:    . artificial tears   Both Eyes Q8H  . budesonide  0.5 mg Nebulization BID  .  chlorhexidine gluconate (MEDLINE KIT)  15 mL Mouth Rinse BID  . Chlorhexidine Gluconate Cloth  6 each Topical Daily  . Chlorhexidine Gluconate Cloth  6 each Topical Daily  . famotidine  20 mg Per Tube Daily  . feeding supplement (PRO-STAT SUGAR FREE 64)  30 mL Per Tube TID  . hydrocortisone sodium succinate  50 mg Intravenous Q6H  . insulin aspart  1-3 Units Subcutaneous Q4H  . ipratropium-albuterol  3 mL Nebulization TID  . mouth rinse  15 mL Mouth Rinse 10 times per day  . pantoprazole sodium  40 mg Per Tube BID  . sodium chloride flush  10-40 mL Intracatheter Q12H  . sodium chloride flush  10-40 mL Intracatheter Q12H  . sodium chloride flush  3 mL Intravenous Q12H  . sucralfate  1 g Per Tube Q6H   Elmarie Shiley, MD 07/06/2018, 7:55 AM

## 2018-07-06 NOTE — Progress Notes (Signed)
Notified Dr. Nelda Marseille and Dr. Posey Pronto of new ABG results.  Awaiting new orders.   Results for Marcus Williams, Marcus Williams (MRN [id number]) as of 07/06/2018 09:45  Ref. Range 07/06/2018 09:24  pH, Arterial Latest Ref Range: 7.350 - 7.450  7.534 (H)  pCO2 arterial Latest Ref Range: 32.0 - 48.0 mmHg 37.6  pO2, Arterial Latest Ref Range: 83.0 - 108.0 mmHg 96.0  TCO2 Latest Ref Range: 22 - 32 mmol/L 33 (H)  Acid-Base Excess Latest Ref Range: 0.0 - 2.0 mmol/L 8.0 (H)  Bicarbonate Latest Ref Range: 20.0 - 28.0 mmol/L 32.0 (H)  O2 Saturation Latest Units: % 98.0  Patient temperature Unknown 35.7 C  Collection site Unknown ARTERIAL LINE

## 2018-07-06 NOTE — Consult Note (Signed)
Neurology Consultation Note  Consult Requested by: Dr. Nelda Marseille  Reason for Consult: Worsening mental status after PEA arrest  Consult Date: 07/06/18   The history was obtained from the RN and chart.  During history and examination, all items were not able to obtain unless otherwise noted.  History of Present Illness:  Marcus Williams is a 63 y.o. Caucasian male with PMH of A. fib on Eliquis, OSA on CPAP, CAD, hypertension, diabetes, COPD, CHF, CVA and SVT, and aortic wall bypass in [facility] in 01/2018 admitted for fever and shortness of breath on 07/03/2018.  On admission, patient had a fever 103 12 4, tachypnea and wheezing and tachycardia.  Found to have influenza B as well as AKI and elevated lactic acid.  Cardiology consulted for CHF, continue on Lasix and patient condition improving initially.  However, on 07/03/2018 patient up in the chair, had PEA arrest s/p 12 minutes of CPR.  No hyperthermia due to PA was unwitnessed.  Patient developed profound metabolic acidosis, hypotension, treated with multiple pressors.  Also developed oliguria and hyperkalemia with elevated creatinine, nephrology consulted, patient was put on CRRT.  He also had a right lower lung pneumonia and was treated with vancomycin and Rocephin.  Continue to have hypotension likely due to septic shock versus cardiogenic shock, put on hydrocortisone.  On 07/05/2018, patient developed hypoxia with saturation down to 80s, and continued anuric and elevated AST/ALT and PT/INR.  Heparin IV discontinued.  Palliative care involved, however wife requested full code at this time.  Today, patient continued to have multiorgan failure and worsening mental status, neurology was consulted for further evaluation.  MRI or CT head cannot be done as patient is not able to lie flat due to desaturation.  EEG was ordered.  As per RN, this time PEA arrest was the fifth PEA arrest for the patient.  As per chart, the last PEA arrest was in 01/2018 when he had AV bypass  in [place], he was also status post CPR resuscitation.  He was on amiodarone metoprolol and Eliquis for A. fib and he was trached but decanulized before discharge.  In 05/2018 he was admitted 8 to [facility] for CHF and COPD and found to have hypotension and bradycardia was temporary paced but no permanent pacing.   Past Medical History:  Diagnosis Date  . A-fib ([place])    on Pradaxa  . Aortic stenosis, moderate 11/2015  . Arthritis   . CAD (coronary artery disease)   . Chronic combined systolic (congestive) and diastolic (congestive) heart failure ([place])   . COPD (chronic obstructive pulmonary disease) (H[facility])    2L home O2  . Diabetes mellitus without complication ([place])    INSULIN DEPENDENT  . GERD (gastroesophageal reflux disease)   . History of cardioversion   . Hypertension   . Influenza B 06/2018  . MI (myocardial infarction) ([place])   . OSA on CPAP   . Sepsis ([place])   . Stroke ([place])   . SVT (supraventricular tachycardia) ([facility])     Past Surgical History:  Procedure Laterality Date  . 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 BUNIONECTOMY    . CARDIAC CATHETERIZATION N/A 10/02/2015   Procedure: Coronary/Grafts Angiography;  Surgeon: Teodoro Spray, MD;  Location: [place] [facility];  Service: Cardiovascular;  Laterality: N/A;  . CARDIAC CATHETERIZATION N/A 11/21/2015   Procedure: Right and Left Heart Cath;  Surgeon:  Sherren Mocha, MD;  Location: [place] [facility];  Service: Cardiovascular;  Laterality: N/A;  . CORONARY STENT INTERVENTION N/A 07/30/2017   Procedure: CORONARY STENT INTERVENTION;  Surgeon: Isaias Cowman, MD;  Location: [place] [facility];  Service: Cardiovascular;  Laterality: N/A;  . CORONARY STENT INTERVENTION N/A 10/23/2017   Procedure: CORONARY STENT INTERVENTION;  Surgeon: Yolonda Kida, MD;  Location: [place] [facility];  Service: Cardiovascular;   Laterality: N/A;  . KNEE SURGERY    . LEFT HEART CATH AND CORONARY ANGIOGRAPHY N/A 10/30/2017   Procedure: LEFT HEART CATH AND CORONARY ANGIOGRAPHY;  Surgeon: Teodoro Spray, MD;  Location: [place] [facility];  Service: Cardiovascular;  Laterality: N/A;  . LEFT HEART CATH AND CORS/GRAFTS ANGIOGRAPHY N/A 10/22/2017   Procedure: LEFT HEART CATH AND CORS/GRAFTS ANGIOGRAPHY;  Surgeon: Corey Skains, MD;  Location: [place] [facility];  Service: Cardiovascular;  Laterality: N/A;  . RIGHT/LEFT HEART CATH AND CORONARY/GRAFT ANGIOGRAPHY N/A 07/30/2017   Procedure: RIGHT/LEFT HEART CATH AND CORONARY/GRAFT ANGIOGRAPHY;  Surgeon: Isaias Cowman, MD;  Location: [place] [facility];  Service: Cardiovascular;  Laterality: N/A;  . TEE WITHOUT CARDIOVERSION N/A 11/21/2015   Procedure: TRANSESOPHAGEAL ECHOCARDIOGRAM (TEE);  Surgeon: Larey Dresser, MD;  Location: [place];  Service: Cardiovascular;  Laterality: N/A;  . TRACHEOSTOMY    . TRACHEOSTOMY CLOSURE      Family History  Problem Relation Age of Onset  . Stroke Mother   . Heart attack Mother   . Hypertension Mother   . Heart attack Father   . Hypertension Father   . Heart attack Brother        #1  . Diabetes Brother        #1  . Heart disease Brother        #2  . Lung disease Brother        #2  . Hypertension Brother        #2  . Diabetes Brother        #2    Social History:  reports that he has never smoked. He has never used smokeless tobacco. He reports that he does not drink alcohol or use drugs.  Allergies:  Allergies  Allergen Reactions  . Contrast Media [Iodinated Diagnostic Agents] Anaphylaxis and Shortness Of Breath    Hypoxia/hypotension - anaphylaxis occurred despite premedication  . Versed [Midazolam] Shortness Of Breath and Other (See Comments)    Hypotension    No current facility-administered medications on file prior to encounter.    Current Outpatient Medications on File Prior to Encounter   Medication Sig Dispense Refill  . acetaminophen (TYLENOL) 325 MG tablet Take 2 tablets (650 mg total) by mouth every 6 (six) hours as needed for moderate pain (headache).    Marland Kitchen albuterol (PROVENTIL HFA;VENTOLIN HFA) 108 (90 Base) MCG/ACT inhaler Inhale 2 puffs into the lungs every 6 (six) hours as needed for wheezing or shortness of breath. 1 Inhaler 10  . apixaban (ELIQUIS) 5 MG TABS tablet Take 5 mg by mouth 2 (two) times daily.    Marland Kitchen atorvastatin (LIPITOR) 40 MG tablet Take 1 tablet (40 mg total) by mouth daily at 6 PM. 30 tablet 0  . DULoxetine (CYMBALTA) 20 MG capsule Take 20 mg by mouth daily.    . famotidine (PEPCID) 20 MG tablet Take 1 tablet (20 mg total) by mouth 2 (two) times daily. 60 tablet 0  . Fluticasone-Salmeterol (ADVAIR) 250-50 MCG/DOSE AEPB Inhale 1 puff into the lungs 2 (two) times  daily. 60 each 1  . guaiFENesin-codeine 100-10 MG/5ML syrup Take 5 mLs by mouth every 6 (six) hours as needed for cough.    Marland Kitchen ipratropium-albuterol (DUONEB) 0.5-2.5 (3) MG/3ML SOLN Take 3 mLs by nebulization 2 (two) times daily. 360 mL   . metFORMIN (GLUCOPHAGE) 1000 MG tablet Take 1,000 mg by mouth 2 (two) times daily with a meal.    . metoprolol tartrate (LOPRESSOR) 25 MG tablet Take 1 tablet (25 mg total) by mouth 2 (two) times daily.    . ondansetron (ZOFRAN-ODT) 4 MG disintegrating tablet Take 4 mg by mouth every 8 (eight) hours as needed for nausea/vomiting.    . pantoprazole (PROTONIX) 40 MG tablet Take 1 tablet (40 mg total) by mouth 2 (two) times daily. (Patient taking differently: Take 40 mg by mouth daily. ) 60 tablet 2  . polyethylene glycol (MIRALAX / GLYCOLAX) packet Take 17 g by mouth daily. (Patient taking differently: Take 17 g by mouth daily as needed for mild constipation. ) 14 each 0  . Potassium Chloride ER 20 MEQ TBCR Take 10 mEq by mouth daily. (Patient taking differently: Take 20 mEq by mouth daily. ) 34 tablet 6  . pramipexole (MIRAPEX) 0.5 MG tablet Take 0.5 mg by mouth 3  (three) times daily as needed.     . senna (SENOKOT) 8.6 MG TABS tablet Take 1 tablet (8.6 mg total) by mouth daily as needed for mild constipation. 120 each 0  . sucralfate (CARAFATE) 1 g tablet Take 1 tablet (1 g total) by mouth 4 (four) times daily. 120 tablet 1  . torsemide (DEMADEX) 20 MG tablet Take 1 tablet (20 mg total) by mouth daily.    Marland Kitchen zolpidem (AMBIEN) 5 MG tablet Take 1 tablet (5 mg total) by mouth at bedtime as needed for sleep. 10 tablet 0  . budesonide (PULMICORT) 0.5 MG/2ML nebulizer solution Take 2 mLs (0.5 mg total) by nebulization 2 (two) times daily.  12  . clopidogrel (PLAVIX) 75 MG tablet Take 1 tablet (75 mg total) by mouth daily with breakfast. (Patient not taking: Reported on 05/19/2018) 30 tablet 0  . oxyCODONE (OXY IR/ROXICODONE) 5 MG immediate release tablet Take 1-2 tablets (5-10 mg total) by mouth every 6 (six) hours as needed for moderate pain, severe pain or breakthrough pain. (Patient not taking: Reported on 07/04/2018) 20 tablet 0  . sodium chloride (OCEAN) 0.65 % SOLN nasal spray Place 1 spray into both nostrils as needed for congestion. (Patient not taking: Reported on 06/14/2018)  0    Review of Systems: A full ROS was attempted today and was not able to be performed.  Systems assessed include - Constitutional, Eyes, HENT, Respiratory, Cardiovascular, Gastrointestinal, Genitourinary, Integument/breast, Hematologic/lymphatic, Musculoskeletal, Neurological, Behavioral/Psych, Endocrine, Allergic/Immunologic - with pertinent responses as per HPI.  Physical Examination: Temp:  [96.1 F (35.6 C)-98.9 F (37.2 C)] 97.3 F (36.3 C) (01/21 1215) Pulse Rate:  [95-109] 101 (01/21 1215) Resp:  [0-29] 17 (01/21 1215) BP: (91-120)/(52-71) 103/70 (01/21 1200) SpO2:  [68 %-100 %] 100 % (01/21 1215) Arterial Line BP: (92-123)/(45-58) 106/54 (01/21 1215) FiO2 (%):  [40 %-100 %] 100 % (01/21 1200) Weight:  [118.7 kg] 118.7 kg (01/21 0406)  General - well nourished, well  developed, intubated off sedation this morning.    Ophthalmologic - fundi not visualized due to noncooperation.    Cardiovascular - regular rate and rhythm, but tachycardia  Extremities - right big toe status post amputation  Neuro - intubated off sedation since this morning, eyes  half open, not following commands. With sternal rub, pt opens eyes, disconjugate with bilateral mild outward gaze, not blinking to visual threat, doll's eyes absent, not tracking, but PERRL bilaterally. Corneal reflex present bilaterally, left stronger than right, gag present and cough absent. Breathing over the vent.  Facial symmetry not able to test due to ET tube.  Tongue midline in mouth.  Left upper extremity has spontaneous movement but not against gravity.  On pain stimulation, BUE and BLE slight withdraw. DTR diminished and no babinski. Sensation, coordination and gait not tested.    Data Reviewed: Dg Chest 2 View  Result Date: 06/27/2018 CLINICAL DATA:  Nausea with dry heaves.  Headache. EXAM: CHEST - 2 VIEW COMPARISON:  05/22/2018 and 05/19/2018 radiographs. FINDINGS: Stable cardiomegaly and aortic atherosclerosis status post median sternotomy, CABG and left ventricular apical/aortic conduit placement. Diffuse interstitial prominence and left-greater-than-right basilar pulmonary opacities do not appear significantly changed. There is no pneumothorax or significant pleural effusion. No acute osseous findings are evident. IMPRESSION: Similar postoperative radiographic appearance of the chest with probable chronic vascular congestion and left basilar scarring. No apparent acute findings. Electronically Signed   By: Richardean Sale M.D.   On: 06/27/2018 18:50   US Renal  Result Date: 07/04/2018 CLINICAL DATA:  63 year old male with acute kidney injury. EXAM: RENAL / URINARY TRACT ULTRASOUND COMPLETE COMPARISON:  05/22/2018 renal ultrasound. CT Abdomen and Pelvis 03/24/2017. FINDINGS: Right Kidney: Renal  measurements: 12.2 x 6.6 x 6.6 centimeters = volume: 278 mL (stable). Echogenicity within normal limits. No mass or hydronephrosis visualized. Left Kidney: Not as well visualized as the right kidney. Renal measurements: 11.3 x 7.0 x 5.5 centimeters = volume: 227 mL (not significantly changed). Echogenicity within normal limits. No mass or hydronephrosis visualized. Bladder: Could not be visualized due to bowel gas. Other findings: Small volume ascites appears similar since December. Echogenic liver and nodular liver contour (images 25 and 26). IMPRESSION: 1. Kidneys appear stable since December and within normal limits. Bladder could not be identified. 2. Cirrhotic appearing liver with small volume ascites similar to December. Electronically Signed   By: Genevie Ann M.D.   On: 07/04/2018 13:53   Dg Chest Port 1 View  Result Date: 07/06/2018 CLINICAL DATA:  Intubation. EXAM: PORTABLE CHEST 1 VIEW COMPARISON:  07/05/2018. FINDINGS: Endotracheal tube, NG tube, right PICC line in stable position. Prior CABG. Cardiac valvular structure again noted projected over the periphery of the heart with adjacent postsurgical change consistent with aortic conduit. Cardiomegaly with diffuse bilateral pulmonary infiltrates. No pleural effusion or pneumothorax. IMPRESSION: 1.  Lines and tubes in stable position. 2. Cardiomegaly. Diffuse bilateral pulmonary infiltrates. Findings consistent with pulmonary edema and/or bilateral multifocal pneumonia. Stable postsurgical change. Electronically Signed   By: Marcello Moores  Register   On: 07/06/2018 07:14   Dg Chest Port 1 View  Result Date: 07/05/2018 CLINICAL DATA:  Initial evaluation for acute hypoxia, shortness of breath. EXAM: PORTABLE CHEST 1 VIEW COMPARISON:  Prior radiograph from 07/04/2018. FINDINGS: Endotracheal tube in place with tip position well above the carina. Enteric tube courses into the abdomen. Right subclavian approach central venous catheter in place with tip overlying the  distal SVC, stable. 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 Lungs hypoinflated. Previously seen right upper lobe infiltrate improved from previous. Right lower lobe infiltrate persists but also improved. Dense opacity within the retrocardiac left lower lobe similar to previous, and could reflect atelectasis or consolidation. Small bilateral pleural effusions. Perihilar vascular in diffuse interstitial congestion. No pneumothorax.  Osseous structures unchanged. IMPRESSION: 1. Support apparatus in satisfactory position. 2. Interval improvement in right upper and lower lobe infiltrates as compared to previous, suggesting improved pneumonia. 3. Persistent dense opacity within the retrocardiac left lower lobe, which could reflect atelectasis or consolidation. 4. Underlying diffuse pulmonary interstitial congestion with probable small bilateral pleural effusions. Electronically Signed   By: Jeannine Boga M.D.   On: 07/05/2018 03:53   Dg Chest Port 1 View  Result Date: 07/04/2018 CLINICAL DATA:  Respiratory failure.  History of sepsis. EXAM: PORTABLE CHEST 1 VIEW COMPARISON:  July 03, 2018 FINDINGS: The ETT is in good position. The NG tube terminates below today's study. Infiltrate in the right upper lung persists. New infiltrate in the right base. Atelectasis in the left base. Stable cardiomegaly. The hila and mediastinum are unchanged. No pneumothorax. Stable right Port-A-Cath. No acute abnormalities otherwise seen. IMPRESSION: 1. Persistent infiltrate in the right upper lobe and new infiltrate in the right lower lobe suggestive of multifocal pneumonia. Recommend follow-up to resolution. 2. Support apparatus above. Electronically Signed   By: Dorise Bullion III M.D   On: 07/04/2018 10:19   Dg Chest Port 1 View  Result Date: 07/03/2018 CLINICAL DATA:  Encounter for endotracheal tube placement. Difficult airway for inhibition. EXAM: PORTABLE CHEST 1 VIEW COMPARISON:  07/03/2017  at 0905 hours. FINDINGS: Endotracheal tube is roughly 4 cm above the carina. Nasogastric tube extends into the abdomen. There is new extensive airspace disease in the right upper lung. Right subclavian central line has been placed and tip in the lower SVC region. Negative for a pneumothorax. Again noted are densities at left lung base with a surgical device and history of a left ventricle to aortic conduit. Hazy densities in left hilum. IMPRESSION: 1. Extensive new airspace disease in the right upper lung. Findings concerning for aspiration based on the history of difficult intubation. 2. Endotracheal tube is appropriately positioned. 3. Right subclavian line in the lower SVC. Negative for pneumothorax. 4. Stable postsurgical changes in left lower chest. Electronically Signed   By: Markus Daft M.D.   On: 07/03/2018 15:23   Dg Chest Port 1 View  Result Date: 07/03/2018 CLINICAL DATA:  Shortness of breath EXAM: PORTABLE CHEST 1 VIEW COMPARISON:  July 02, 2018 FINDINGS: There is airspace consolidation in the left lower lobe region. Right lung is clear. There is cardiomegaly with pulmonary vascularity within normal limits. Patient is status post coronary artery bypass grafting. The peripherally placed valve replacement is within a surgically created aortic conduit, better appreciated on lateral view from several days prior. There is aortic atherosclerosis. No adenopathy evident. No bone lesions. IMPRESSION: Airspace consolidation concerning for pneumonia left lower lobe. Lungs elsewhere clear. Stable cardiac silhouette. Valve replacement unusually peripherally located on the left is noted on lateral view to be within a surgically created aortic conduit. Valve position is stable. There is aortic atherosclerosis. Aortic Atherosclerosis (ICD10-I70.0). Electronically Signed   By: Lowella Grip III M.D.   On: 07/03/2018 09:22   Dg Chest Port 1 View  Result Date: 07/02/2018 CLINICAL DATA:  Shortness of breath  EXAM: PORTABLE CHEST 1 VIEW COMPARISON:  06/16/2018 FINDINGS: There is moderate cardiomegaly and findings of prior CABG and valvuloplasty, unchanged. There is no focal airspace consolidation. There is atelectasis at the left lung base. No pneumothorax. No pulmonary edema. IMPRESSION: Unchanged cardiomegaly without pulmonary edema. Left basilar atelectasis. Electronically Signed   By: Ulyses Jarred M.D.   On: 07/02/2018 01:13   Dg Chest [facility]  Result Date: 06/29/2018 CLINICAL DATA:  Shortness of breath EXAM: PORTABLE CHEST 1 VIEW COMPARISON:  06/27/2018 FINDINGS: Increased interstitial markings. Chronic bibasilar opacities. No focal consolidation or frank interstitial edema. Small left pleural effusion. No pneumothorax. Chronic postsurgical changes at the left lung base related to left ventricular apical/aortic conduit. Cardiomegaly.  Postsurgical changes related to prior CABG. Median sternotomy. IMPRESSION: Stable postsurgical and chronic findings, as above. Electronically Signed   By: Julian Hy M.D.   On: 07/08/2018 04:05   US Abdomen Limited Ruq  Result Date: 06/27/2018 CLINICAL DATA:  Right upper quadrant pain for 1 day. EXAM: ULTRASOUND ABDOMEN LIMITED RIGHT UPPER QUADRANT COMPARISON:  05/22/2018 FINDINGS: Gallbladder: Gallbladder is contracted. Diffusely thickened gallbladder wall at 4.9 mm. Pericholecystic edema is present. No stones are identified. Murphy's sign is negative. Common bile duct: Diameter: 3.1 mm, normal Liver: Mildly heterogeneous parenchymal echotexture with nodular contour suggesting cirrhosis. Small amount of ascites around the liver edge. Portal vein is patent on color Doppler imaging with normal direction of blood flow towards the liver. IMPRESSION: 1. Gallbladder wall thickening and edema without stone and negative Murphy's sign. Changes likely related to cirrhosis and ascites. 2. Heterogeneous nodular appearance to the liver consistent with cirrhosis. Small amount of  ascites. Electronically Signed   By: Lucienne Capers M.D.   On: 06/27/2018 19:07    Assessment: 63 y.o. male with PMH of A. fib on Eliquis, OSA on CPAP, CAD, hypertension, diabetes, COPD, CHF, CVA and SVT, and aortic valve bypass in [facility] in 01/2018 admitted for fever and shortness of breath on 07/10/2018. Found to have influenza B as well as AKI and elevated lactic acid.  However, patient suffered unwitnessed PEA arrest on 07/03/18 and s/p 12 minutes of CPR. Patient subsequently developed profound metabolic acidosis, sepsis, septic shock, cardiogenic shock requiring multiple pressors, kidney failure requiring CRRT, pneumonia treated with vancomycin and Rocephin, liver failure with elevated AST/ALT and PT/INR, and respiratory failure need continued mechanical ventilation. Neurology was consulted for worsening mental status.  MRI or CT head cannot be done as patient is not able to lie flat due to desaturation.  EEG was ordered, preliminary read no seizure with diffuse slowing without lateralization.  Clinical presentation consistent with severe encephalopathy.  Plan: -Severe encephalopathy, likely related to multi-organ failure -No evidence of clinical or subclinical seizure -Continue to treat underlying medical conditions -EEG official read pending -MRI brain or CT head once pt condition allows -likely poor prognosis given multi-organ failure -will follow   Thank you for this consultation and allowing [place] to participate in the care of this patient.  Rosalin Hawking, MD PhD Stroke Neurology 07/06/2018 2:55 PM

## 2018-07-06 NOTE — Progress Notes (Signed)
Pt FiO2 increased to 100% due to SpO2 at 68%. Will continue to monitor.

## 2018-07-06 NOTE — Progress Notes (Signed)
eLink Physician-Brief Progress Note Patient Name: Marcus Williams DOB: May 03, 1956 MRN: [id number]   Date of Service  07/06/2018  HPI/Events of Note  ABG on 40%/PRVC 28/TV 570/P 8 = 7.539/32.8/113.  eICU Interventions  Will order: 1. Decrease PRVC rate to 21. 2. ABG at 7:45 AM.     Intervention Category Major Interventions: Acid-Base disturbance - evaluation and management;Respiratory failure - evaluation and management  Noam Franzen Cornelia Copa 07/06/2018, 6:40 AM

## 2018-07-06 NOTE — Progress Notes (Signed)
EEG Completed; Results Pending  

## 2018-07-06 NOTE — Consult Note (Signed)
[place] Nurse wound consult note Patient receiving care in [facility] 2H08.  No family present.  Assisted with turning by his primary RN, Elmyra Ricks.  Patient transferred to this location on 07/03/18.  He is now intubated, sedated, on CVVHD.  Reason for Consult: Sacral DTPI Wound type: DPTI to sacrum Pressure Injury POA: No Measurement: There are several horizontal linear marks, consistent with pressure from linens that are purple in color and non-blanchable.  These are consistent with DTPI.  The entire affected area measures 6.8 cm x 6.8 cm.  There are segments of tissue that is normal in color and texture separating the horizontal purple areas.  At the top of the gluteal fold, in the location of the coccyx, there is a very small open area where the overlying tissue is missing.  There is a sacral foam dressing on the area. Drainage (amount, consistency, odor) No odor, drainage, or induration noted Periwound: As described above Dressing procedure/placement/frequency: Sacral foam dressing. Evaluate under the dressing each shift. Change the foam every 3 days and prn. Monitor the wound area(s) for worsening of condition such as: Signs/symptoms of infection,  Increase in size,  Development of or worsening of odor, Development of pain, or increased pain at the affected locations.  Notify the medical team if any of these develop.  Thank you for the consult.  Discussed plan of care with the bedside nurse.   Val Riles, RN, MSN, CWOCN, CNS-BC, pager [phone]

## 2018-07-06 NOTE — Progress Notes (Signed)
Palliative:  I met again today with wife, Marcus Williams, and daughters, Marcus Williams and Marcus Williams. Marcus Williams is no better and actually appears to have worsened as he is requiring max vent support and is not responding with sedation turned off. I discussed again poor prognosis and lack of improvement. Prognosis remains grim. Marcus Williams tells me that she wants to wait for EEG results before making any decisions. If EEG or any other test shows brain dysfunction she agrees that he would not want to live this way.   Late entry: I met again at bedside with wife and daughters. I explained that the EEG showed diffuse slowing per neurology notes which is fairly non-specific but thought to be likely d/t multiorgan failure. We discussed that this does not provide very much information but the fact that he continues off sedation without response and the fact that he has worsened since yesterday is more important. I recommended DNR status recognizing if his heart stops that it is his time and wife agrees. She wants to leave this up to God. I also recommended that if there is no improvement tomorrow that we consider the benefit of keeping him on life support and machines and wife agrees. She is coming to the realization, with the support of her daughters, that he is at end of life and that even if by some miracle he improves his QOL would be extremely poor. I anticipate likely transition to full comfort care tomorrow and will follow up with family for wishes and plans.   Exam: Unresponsive. Full vent support. Bilateral feet mottling.   Plan: - DNR - Possible one way extubation and comfort care tomorrow.   66 min  Vinie Sill, NP Palliative Medicine Team Pager # [phone] (M-F 8a-5p) Team Phone # [phone] (Nights/Weekends)

## 2018-07-06 NOTE — Procedures (Addendum)
History: 63 yo M s/p cardiac arrest  Sedation: None  Technique: This is a 21 channel routine scalp EEG performed at the bedside with bipolar and monopolar montages arranged in accordance to the international 10/20 system of electrode placement. One channel was dedicated to EKG recording.    Background: The recording is dominated by generalized irregular delta with some posteriorly predominant irregular theta range activity.  There are also occasional bifrontally predominant discharges with triphasic morphology. NO clear PDR was seen.   Photic stimulation: Physiologic driving is not performed  EEG Abnormalities: 1) occasional triphasic waves 2) generalized irregular slow activity  Clinical Interpretation: This EEG is consistent with a generalized nonspecific cerebral dysfunction (encephalopathy).  There was no seizure or seizure predisposition recorded on this study. Please note that lack of epileptiform activity on EEG does not preclude the possibility of epilepsy.  Roland Rack, MD [facility] [phone]  If 7pm- 7am, please page neurology on call as listed in [place].

## 2018-07-07 ENCOUNTER — Inpatient Hospital Stay (HOSPITAL_COMMUNITY): Payer: Medicare HMO

## 2018-07-07 DIAGNOSIS — Z7189 Other specified counseling: Secondary | ICD-10-CM

## 2018-07-07 DIAGNOSIS — Z515 Encounter for palliative care: Secondary | ICD-10-CM

## 2018-07-07 LAB — POCT I-STAT 7, (LYTES, BLD GAS, ICA,H+H)
BICARBONATE: 27.5 mmol/L (ref 20.0–28.0)
Calcium, Ion: 0.98 mmol/L — ABNORMAL LOW (ref 1.15–1.40)
HCT: 31 % — ABNORMAL LOW (ref 39.0–52.0)
Hemoglobin: 10.5 g/dL — ABNORMAL LOW (ref 13.0–17.0)
O2 Saturation: 89 %
PH ART: 7.295 — AB (ref 7.350–7.450)
Potassium: 4.2 mmol/L (ref 3.5–5.1)
Sodium: 137 mmol/L (ref 135–145)
TCO2: 29 mmol/L (ref 22–32)
pCO2 arterial: 56.5 mmHg — ABNORMAL HIGH (ref 32.0–48.0)
pO2, Arterial: 65 mmHg — ABNORMAL LOW (ref 83.0–108.0)

## 2018-07-07 LAB — COMPREHENSIVE METABOLIC PANEL
ALT: 604 U/L — ABNORMAL HIGH (ref 0–44)
AST: 1517 U/L — ABNORMAL HIGH (ref 15–41)
Albumin: 2.5 g/dL — ABNORMAL LOW (ref 3.5–5.0)
Alkaline Phosphatase: 221 U/L — ABNORMAL HIGH (ref 38–126)
Anion gap: 10 (ref 5–15)
BUN: 27 mg/dL — ABNORMAL HIGH (ref 8–23)
CO2: 25 mmol/L (ref 22–32)
Calcium: 7 mg/dL — ABNORMAL LOW (ref 8.9–10.3)
Chloride: 101 mmol/L (ref 98–111)
Creatinine, Ser: 1.47 mg/dL — ABNORMAL HIGH (ref 0.61–1.24)
GFR calc Af Amer: 58 mL/min — ABNORMAL LOW (ref >60)
GFR calc non Af Amer: 50 mL/min — ABNORMAL LOW (ref >60)
Glucose, Bld: 155 mg/dL — ABNORMAL HIGH (ref 70–99)
Potassium: 4.1 mmol/L (ref 3.5–5.1)
Sodium: 136 mmol/L (ref 135–145)
Total Bilirubin: 10.1 mg/dL — ABNORMAL HIGH (ref 0.3–1.2)
Total Protein: 6.1 g/dL — ABNORMAL LOW (ref 6.5–8.1)

## 2018-07-07 LAB — CBC
HCT: 30.4 % — ABNORMAL LOW (ref 39.0–52.0)
HEMOGLOBIN: 8.5 g/dL — AB (ref 13.0–17.0)
MCH: 22 pg — ABNORMAL LOW (ref 26.0–34.0)
MCHC: 28 g/dL — AB (ref 30.0–36.0)
MCV: 78.6 fL — ABNORMAL LOW (ref 80.0–100.0)
Platelets: 84 10*3/uL — ABNORMAL LOW (ref 150–400)
RBC: 3.87 MIL/uL — ABNORMAL LOW (ref 4.22–5.81)
RDW: 21.6 % — ABNORMAL HIGH (ref 11.5–15.5)
WBC: 4.7 10*3/uL (ref 4.0–10.5)
nRBC: 3.2 % — ABNORMAL HIGH (ref 0.0–0.2)

## 2018-07-07 LAB — CULTURE, RESPIRATORY W GRAM STAIN: Special Requests: NORMAL

## 2018-07-07 LAB — APTT: aPTT: 51 seconds — ABNORMAL HIGH (ref 24–36)

## 2018-07-07 LAB — PHOSPHORUS: PHOSPHORUS: 2.6 mg/dL (ref 2.5–4.6)

## 2018-07-07 LAB — GLUCOSE, CAPILLARY
GLUCOSE-CAPILLARY: 142 mg/dL — AB (ref 70–99)
GLUCOSE-CAPILLARY: 145 mg/dL — AB (ref 70–99)
Glucose-Capillary: 105 mg/dL — ABNORMAL HIGH (ref 70–99)
Glucose-Capillary: 106 mg/dL — ABNORMAL HIGH (ref 70–99)
Glucose-Capillary: 84 mg/dL (ref 70–99)

## 2018-07-07 LAB — MAGNESIUM: Magnesium: 2.2 mg/dL (ref 1.7–2.4)

## 2018-07-07 MED ORDER — INSULIN ASPART 100 UNIT/ML ~~LOC~~ SOLN: 1.0000 [IU] | SUBCUTANEOUS | Status: DC

## 2018-07-07 MED ORDER — DEXTROSE 10 % IV SOLN: INTRAVENOUS | Status: DC | PRN

## 2018-07-07 MED ORDER — INSULIN DETEMIR 100 UNIT/ML ~~LOC~~ SOLN
5.0000 [IU] | Freq: Two times a day (BID) | SUBCUTANEOUS | Status: DC
  Filled 2018-07-07: qty 0.05

## 2018-07-07 MED ORDER — INSULIN ASPART 100 UNIT/ML ~~LOC~~ SOLN: 3.0000 [IU] | SUBCUTANEOUS | Status: DC

## 2018-07-12 ENCOUNTER — Telehealth: Payer: Self-pay | Admitting: Pulmonary Disease

## 2018-07-12 NOTE — Telephone Encounter (Incomplete)
07/12/18 : Received D/C from [facility]  07/14/18 I RECEIVED TO D/C BACK FROM DR.[place] I FAXED A COPY TO [facility] [phone] AND MAILED ORIGINAL TO [place] DEPT.  PWR

## 2018-07-12 NOTE — Telephone Encounter (Incomplete)
07/12/18 Received D/C from [facility] they want [place] to fax copy to them @ [phone] and Mail Original to [place] Dept. Pwr

## 2018-07-17 NOTE — Progress Notes (Signed)
Pt in asystole, Arterial line no longer pulsatile. No heart sounds auscultated. Pt expired at 0718 on 07-24-2018. Pronounced by Griffin Dakin, RN and [place], RN.

## 2018-07-17 NOTE — Progress Notes (Signed)
eLink Physician-Brief Progress Note Patient Name: Marcus Williams DOB: [birth year] MRN: [id number]   Date of Service  31-Jul-2018  HPI/Events of Note  ABG on 100%/PRVC 16/TV 570/P 10 = 7.29/56.5/65.0  eICU Interventions  Will order: 1. Increase PRVC rate to 20. 2. ABG at 7:15 AM.     Intervention Category Major Interventions: Acid-Base disturbance - evaluation and management;Respiratory failure - evaluation and management  Sommer,Steven Eugene 2018-07-31, 6:08 AM

## 2018-07-17 NOTE — Progress Notes (Signed)
Called by bedside RN, patient is becoming more bradycardic and hypotensive.  Spoke with wife, she is ready to let him go but is struggling with making the decision.  I believe patient will expire on his own.    Soon after, patient became asystolic.  Patient was DNR and showed to signs of distress and was allowed to pass peacefully with the family bedside.  Wife notified, questions answered.  Condolences given.  The patient is critically ill with multiple organ systems failure and requires high complexity decision making for assessment and support, frequent evaluation and titration of therapies, application of advanced monitoring technologies and extensive interpretation of multiple databases.   Critical Care Time devoted to patient care services described in this note is  31  Minutes. This time reflects time of care of this signee Dr Jennet Maduro. This critical care time does not reflect procedure time, or teaching time or supervisory time of PA/NP/Med student/Med Resident etc but could involve care discussion time.  Rush Farmer, M.D. [facility] Pulmonary/Critical Care Medicine. Pager: [phone]. After hours pager: [phone].

## 2018-07-17 DEATH — deceased

## 2018-07-19 IMAGING — US US ABDOMEN COMPLETE
1 series · 14 of 25 positions shown · non-contrast
Comparison: CT scan of March 24, 2017.

CLINICAL DATA: Diarrhea for 2 weeks.

EXAM:
ABDOMEN ULTRASOUND COMPLETE

[Series 1: us abdomen complete · 0.25mm/px · 14 of 107 slices shown]
[im 1/107]
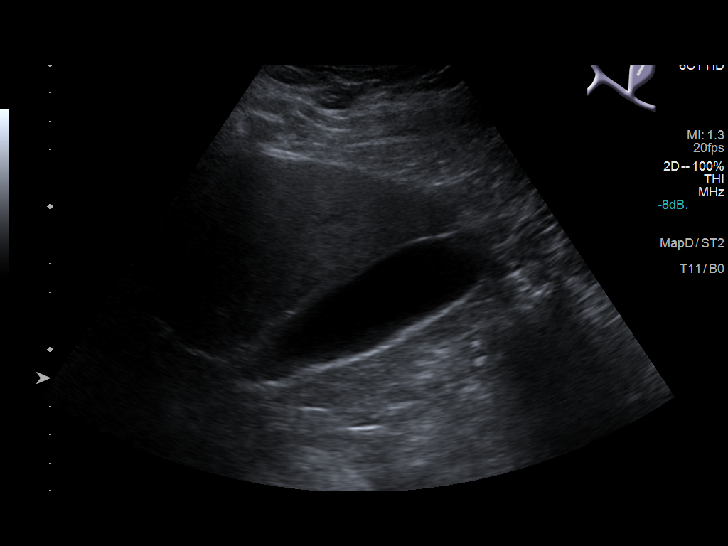
[im 9/107]
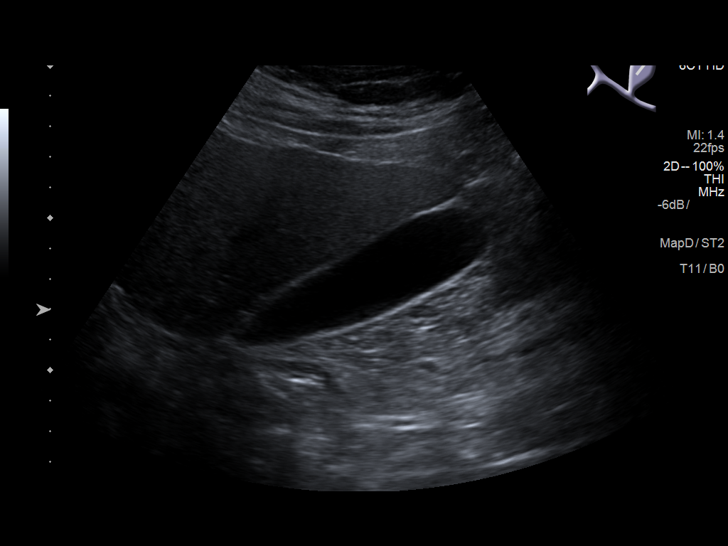
[im 18/107]
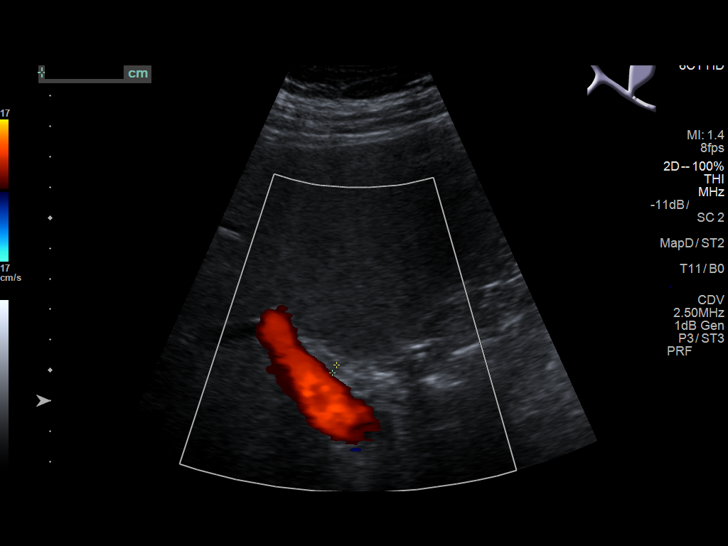
[im 27/107]
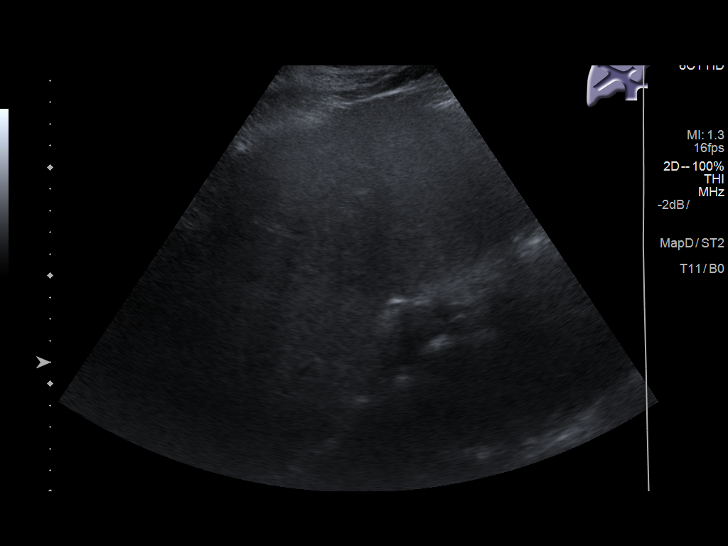
[im 36/107]
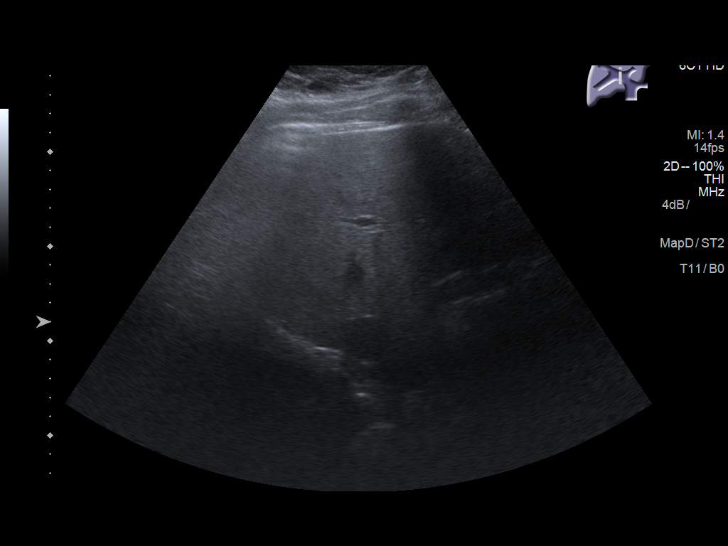
[im 40/107]
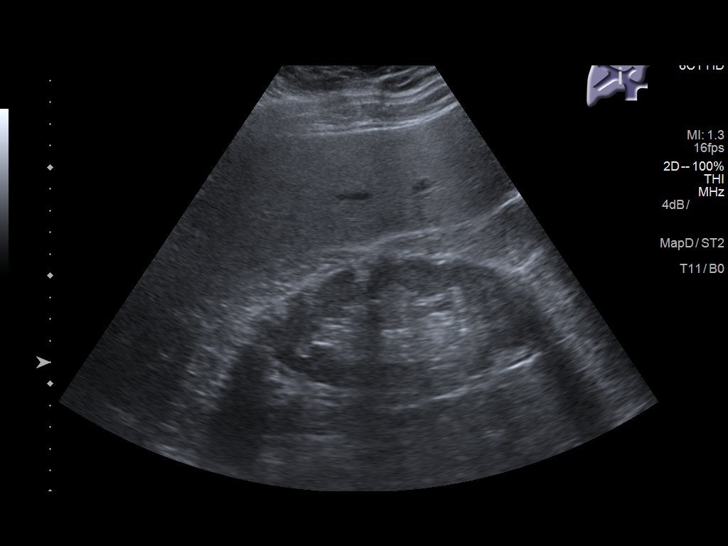
[im 49/107]
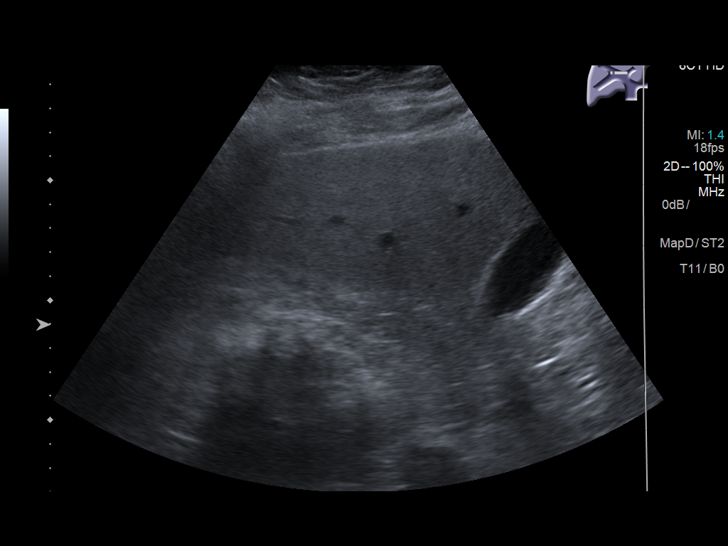
[im 58/107]
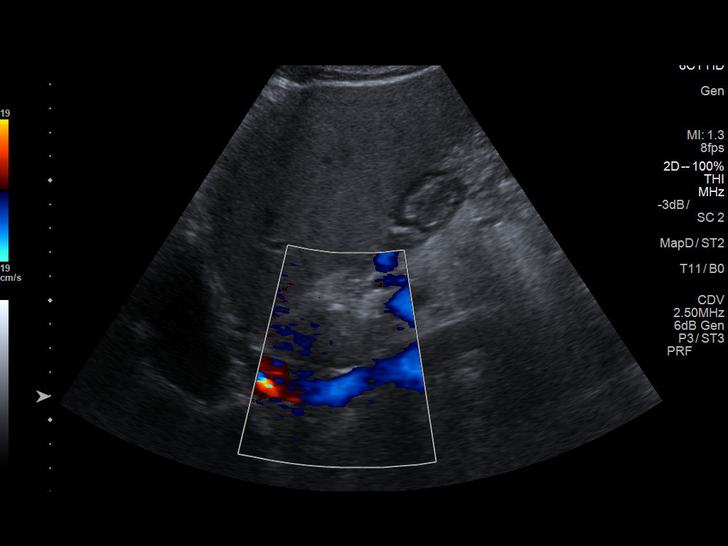
[im 67/107]
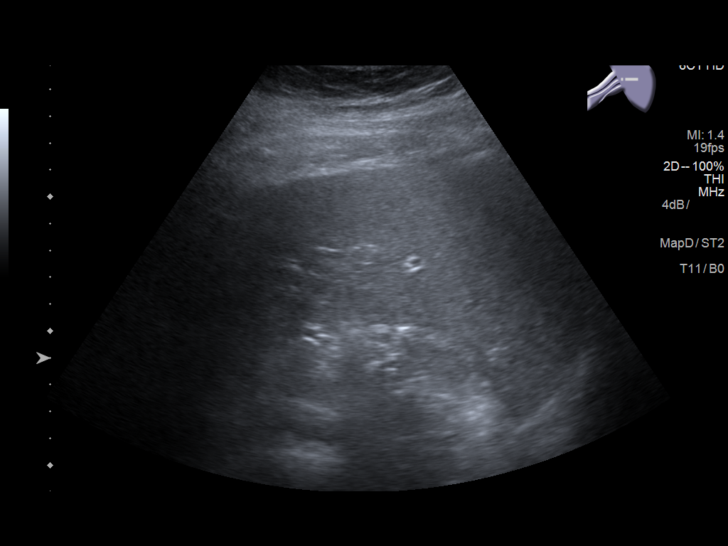
[im 71/107]
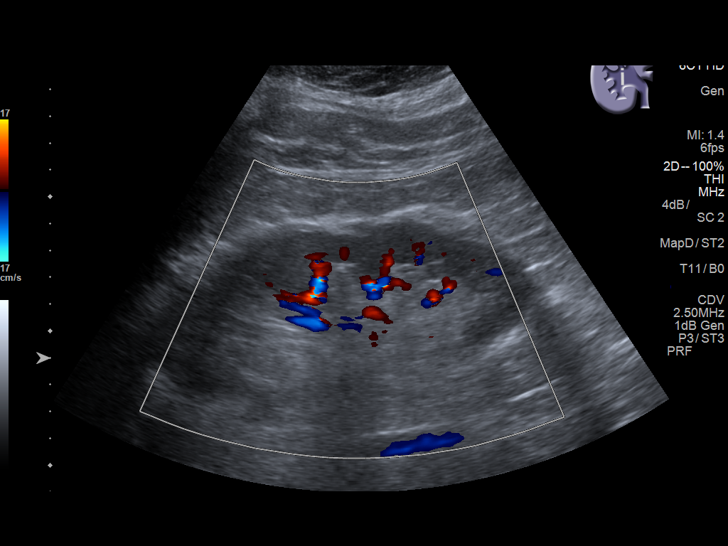
[im 80/107]
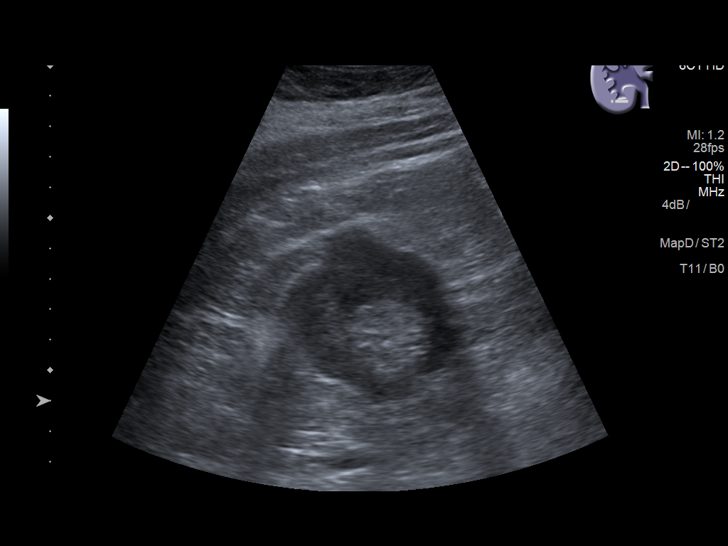
[im 89/107]
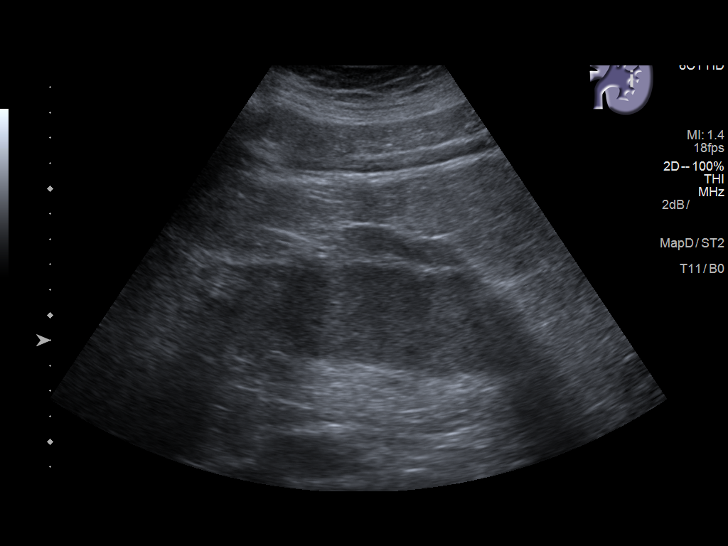
[im 98/107]
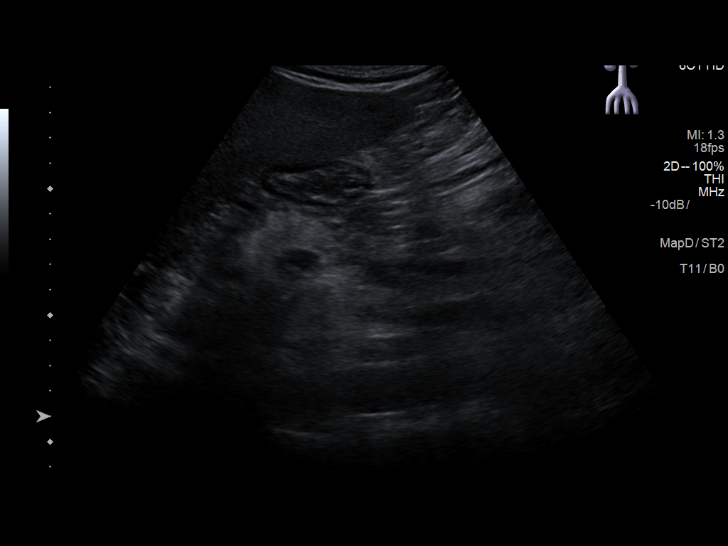
[im 107/107]
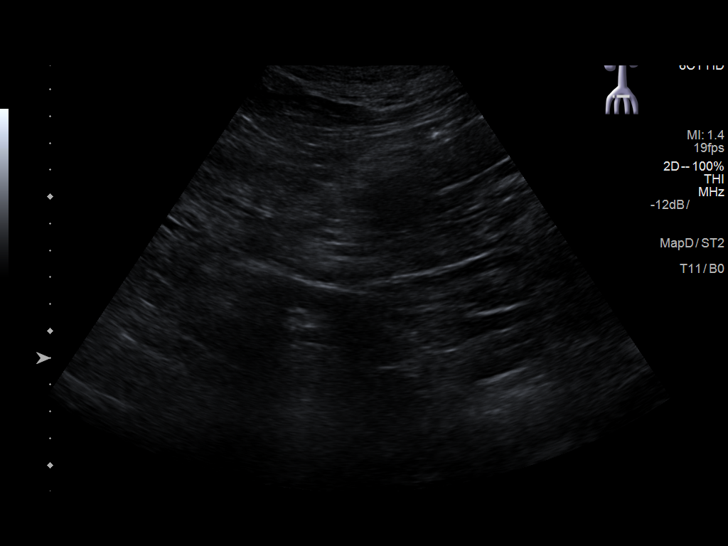

[14 of 25 positions shown; findings below may reference images not displayed]

FINDINGS: Gallbladder: No gallstones or wall thickening visualized. No
sonographic Murphy sign noted by sonographer.

Common bile duct: Diameter: 2.9 mm which is within normal limits.

Liver: No focal lesion identified. Within normal limits in
parenchymal echogenicity. Portal vein is patent on color Doppler
imaging with normal direction of blood flow towards the liver.

IVC: No abnormality visualized.

Pancreas: Visualized portion unremarkable.

Spleen: Size and appearance within normal limits.

Right Kidney: Length: 14 cm. Echogenicity within normal limits. No
mass or hydronephrosis visualized.

Left Kidney: Length: 14.2 cm. Echogenicity within normal limits. No
mass or hydronephrosis visualized.

Abdominal aorta: No aneurysm visualized.

Other findings: None.
IMPRESSION: No definite abnormality seen in the abdomen.

## 2018-07-30 LAB — BLOOD GAS, ARTERIAL
ACID-BASE DEFICIT: 4.9 mmol/L — AB (ref 0.0–2.0)
Bicarbonate: 18.9 mmol/L — ABNORMAL LOW (ref 20.0–28.0)
DRAWN BY: 313941
FIO2: 40
MECHVT: 570 mL
O2 Saturation: 96.8 %
PEEP/CPAP: 5 cmH2O
Patient temperature: 96.3
RATE: 28 resp/min
pCO2 arterial: 28.7 mmHg — ABNORMAL LOW (ref 32.0–48.0)
pH, Arterial: 7.428 (ref 7.350–7.450)
pO2, Arterial: 91 mmHg (ref 83.0–108.0)

## 2018-08-15 NOTE — Death Summary Note (Signed)
DEATH SUMMARY   Patient Details  Name: Marcus Williams MRN: [id number] DOB: 1955/12/25  Admission/Discharge Information   Admit Date:  Jul 05, 2018  Date of Death: Date of Death: 07/11/18  Time of Death: Time of Death: 0718  Length of Stay: 5  Referring Physician: Idelle Crouch, MD   Reason(s) for Hospitalization  Pulseless electrical activity cardiac arrest  Diagnoses  Preliminary cause of death:   Pulseless electrical activity cardiac arrest Secondary Diagnoses (including complications and co-morbidities):  Principal Problem:   Influenza B Active Problems:   HTN (hypertension)   Type II diabetes mellitus ([place])   COPD (chronic obstructive pulmonary disease) (HCC)   OSA on CPAP   A-fib (HCC)   Pressure injury of skin   Chronic combined systolic (congestive) and diastolic (congestive) heart failure ([place])   Goals of care, counseling/discussion   Palliative care encounter   Brief Hospital Course (including significant findings, care, treatment, and services provided and events leading to death)  63 year old male with extensive PMH who presents to PCCM with respiratory failure post Flu B and cardiac arrest.  Patient had anoxic brain injury and was hospitalized for many days without significant improvement.  Called by bedside RN, patient is becoming more bradycardic and hypotensive.  Spoke with wife, she is ready to let him go but is struggling with making the decision.  I believe patient will expire on his own.    Soon after, patient became asystolic.  Patient was DNR and showed to signs of distress and was allowed to pass peacefully with the family bedside.  Wife notified, questions answered.  Condolences given.    Pertinent Labs and Studies  Significant Diagnostic Studies Dg Chest 2 View  Result Date: 06/27/2018 CLINICAL DATA:  Nausea with dry heaves.  Headache. EXAM: CHEST - 2 VIEW COMPARISON:  05/22/2018 and 05/19/2018 radiographs. FINDINGS: Stable cardiomegaly and  aortic atherosclerosis status post median sternotomy, CABG and left ventricular apical/aortic conduit placement. Diffuse interstitial prominence and left-greater-than-right basilar pulmonary opacities do not appear significantly changed. There is no pneumothorax or significant pleural effusion. No acute osseous findings are evident. IMPRESSION: Similar postoperative radiographic appearance of the chest with probable chronic vascular congestion and left basilar scarring. No apparent acute findings. Electronically Signed   By: Richardean Sale M.D.   On: 06/27/2018 18:50   US Renal  Result Date: 07/04/2018 CLINICAL DATA:  63 year old male with acute kidney injury. EXAM: RENAL / URINARY TRACT ULTRASOUND COMPLETE COMPARISON:  05/22/2018 renal ultrasound. CT Abdomen and Pelvis 03/24/2017. FINDINGS: Right Kidney: Renal measurements: 12.2 x 6.6 x 6.6 centimeters = volume: 278 mL (stable). Echogenicity within normal limits. No mass or hydronephrosis visualized. Left Kidney: Not as well visualized as the right kidney. Renal measurements: 11.3 x 7.0 x 5.5 centimeters = volume: 227 mL (not significantly changed). Echogenicity within normal limits. No mass or hydronephrosis visualized. Bladder: Could not be visualized due to bowel gas. Other findings: Small volume ascites appears similar since December. Echogenic liver and nodular liver contour (images 25 and 26). IMPRESSION: 1. Kidneys appear stable since December and within normal limits. Bladder could not be identified. 2. Cirrhotic appearing liver with small volume ascites similar to December. Electronically Signed   By: Genevie Ann M.D.   On: 07/04/2018 13:53   Dg Chest Port 1 View  Result Date: 11-Jul-2018 CLINICAL DATA:  Respiratory failure EXAM: PORTABLE CHEST 1 VIEW COMPARISON:  07/06/2018 FINDINGS: Cardiac shadow remains enlarged. Postsurgical changes are again seen. Endotracheal tube, gastric catheter and right subclavian  central line are again seen and stable.  Increasing patchy infiltrates are noted bilaterally particularly on the right. Postsurgical changes consistent with the apical to aortic conduit are again seen and stable. No bony abnormality is noted. IMPRESSION: Tubes and lines as described above. Increasing right upper and right basilar infiltrates when compared with the prior exam. Postsurgical changes are again noted. Electronically Signed   By: Inez Catalina M.D.   On: 07-16-18 08:01   Dg Chest Port 1 View  Result Date: 07/06/2018 CLINICAL DATA:  Intubation. EXAM: PORTABLE CHEST 1 VIEW COMPARISON:  07/05/2018. FINDINGS: Endotracheal tube, NG tube, right PICC line in stable position. Prior CABG. Cardiac valvular structure again noted projected over the periphery of the heart with adjacent postsurgical change consistent with aortic conduit. Cardiomegaly with diffuse bilateral pulmonary infiltrates. No pleural effusion or pneumothorax. IMPRESSION: 1.  Lines and tubes in stable position. 2. Cardiomegaly. Diffuse bilateral pulmonary infiltrates. Findings consistent with pulmonary edema and/or bilateral multifocal pneumonia. Stable postsurgical change. Electronically Signed   By: Marcello Moores  Register   On: 07/06/2018 07:14   Dg Chest Port 1 View  Result Date: 07/05/2018 CLINICAL DATA:  Initial evaluation for acute hypoxia, shortness of breath. EXAM: PORTABLE CHEST 1 VIEW COMPARISON:  Prior radiograph from 07/04/2018. FINDINGS: Endotracheal tube in place with tip position well above the carina. Enteric tube courses into the abdomen. Right subclavian approach central venous catheter in place with tip overlying the distal SVC, stable. 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 Lungs hypoinflated. Previously seen right upper lobe infiltrate improved from previous. Right lower lobe infiltrate persists but also improved. Dense opacity within the retrocardiac left lower lobe similar to previous, and could reflect atelectasis or consolidation.  Small bilateral pleural effusions. Perihilar vascular in diffuse interstitial congestion. No pneumothorax. Osseous structures unchanged. IMPRESSION: 1. Support apparatus in satisfactory position. 2. Interval improvement in right upper and lower lobe infiltrates as compared to previous, suggesting improved pneumonia. 3. Persistent dense opacity within the retrocardiac left lower lobe, which could reflect atelectasis or consolidation. 4. Underlying diffuse pulmonary interstitial congestion with probable small bilateral pleural effusions. Electronically Signed   By: Jeannine Boga M.D.   On: 07/05/2018 03:53   Dg Chest Port 1 View  Result Date: 07/04/2018 CLINICAL DATA:  Respiratory failure.  History of sepsis. EXAM: PORTABLE CHEST 1 VIEW COMPARISON:  July 03, 2018 FINDINGS: The ETT is in good position. The NG tube terminates below today's study. Infiltrate in the right upper lung persists. New infiltrate in the right base. Atelectasis in the left base. Stable cardiomegaly. The hila and mediastinum are unchanged. No pneumothorax. Stable right Port-A-Cath. No acute abnormalities otherwise seen. IMPRESSION: 1. Persistent infiltrate in the right upper lobe and new infiltrate in the right lower lobe suggestive of multifocal pneumonia. Recommend follow-up to resolution. 2. Support apparatus above. Electronically Signed   By: Dorise Bullion III M.D   On: 07/04/2018 10:19   Dg Chest Port 1 View  Result Date: 07/03/2018 CLINICAL DATA:  Encounter for endotracheal tube placement. Difficult airway for inhibition. EXAM: PORTABLE CHEST 1 VIEW COMPARISON:  07/03/2017 at 0905 hours. FINDINGS: Endotracheal tube is roughly 4 cm above the carina. Nasogastric tube extends into the abdomen. There is new extensive airspace disease in the right upper lung. Right subclavian central line has been placed and tip in the lower SVC region. Negative for a pneumothorax. Again noted are densities at left lung base with a surgical  device and history of a left ventricle to aortic  conduit. Hazy densities in left hilum. IMPRESSION: 1. Extensive new airspace disease in the right upper lung. Findings concerning for aspiration based on the history of difficult intubation. 2. Endotracheal tube is appropriately positioned. 3. Right subclavian line in the lower SVC. Negative for pneumothorax. 4. Stable postsurgical changes in left lower chest. Electronically Signed   By: Markus Daft M.D.   On: 07/03/2018 15:23   Dg Chest Port 1 View  Result Date: 07/03/2018 CLINICAL DATA:  Shortness of breath EXAM: PORTABLE CHEST 1 VIEW COMPARISON:  July 02, 2018 FINDINGS: There is airspace consolidation in the left lower lobe region. Right lung is clear. There is cardiomegaly with pulmonary vascularity within normal limits. Patient is status post coronary artery bypass grafting. The peripherally placed valve replacement is within a surgically created aortic conduit, better appreciated on lateral view from several days prior. There is aortic atherosclerosis. No adenopathy evident. No bone lesions. IMPRESSION: Airspace consolidation concerning for pneumonia left lower lobe. Lungs elsewhere clear. Stable cardiac silhouette. Valve replacement unusually peripherally located on the left is noted on lateral view to be within a surgically created aortic conduit. Valve position is stable. There is aortic atherosclerosis. Aortic Atherosclerosis (ICD10-I70.0). Electronically Signed   By: Lowella Grip III M.D.   On: 07/03/2018 09:22   Dg Chest Port 1 View  Result Date: 07/02/2018 CLINICAL DATA:  Shortness of breath EXAM: PORTABLE CHEST 1 VIEW COMPARISON:  06/21/2018 FINDINGS: There is moderate cardiomegaly and findings of prior CABG and valvuloplasty, unchanged. There is no focal airspace consolidation. There is atelectasis at the left lung base. No pneumothorax. No pulmonary edema. IMPRESSION: Unchanged cardiomegaly without pulmonary edema. Left basilar  atelectasis. Electronically Signed   By: Ulyses Jarred M.D.   On: 07/02/2018 01:13   Dg Chest Port 1 View  Result Date: 07/03/2018 CLINICAL DATA:  Shortness of breath EXAM: PORTABLE CHEST 1 VIEW COMPARISON:  06/27/2018 FINDINGS: Increased interstitial markings. Chronic bibasilar opacities. No focal consolidation or frank interstitial edema. Small left pleural effusion. No pneumothorax. Chronic postsurgical changes at the left lung base related to left ventricular apical/aortic conduit. Cardiomegaly.  Postsurgical changes related to prior CABG. Median sternotomy. IMPRESSION: Stable postsurgical and chronic findings, as above. Electronically Signed   By: Julian Hy M.D.   On: 07/10/2018 04:05   US Abdomen Limited Ruq  Result Date: 06/27/2018 CLINICAL DATA:  Right upper quadrant pain for 1 day. EXAM: ULTRASOUND ABDOMEN LIMITED RIGHT UPPER QUADRANT COMPARISON:  05/22/2018 FINDINGS: Gallbladder: Gallbladder is contracted. Diffusely thickened gallbladder wall at 4.9 mm. Pericholecystic edema is present. No stones are identified. Murphy's sign is negative. Common bile duct: Diameter: 3.1 mm, normal Liver: Mildly heterogeneous parenchymal echotexture with nodular contour suggesting cirrhosis. Small amount of ascites around the liver edge. Portal vein is patent on color Doppler imaging with normal direction of blood flow towards the liver. IMPRESSION: 1. Gallbladder wall thickening and edema without stone and negative Murphy's sign. Changes likely related to cirrhosis and ascites. 2. Heterogeneous nodular appearance to the liver consistent with cirrhosis. Small amount of ascites. Electronically Signed   By: Lucienne Capers M.D.   On: 06/27/2018 19:07    Microbiology No results found for this or any previous visit (from the past 240 hour(s)).  Lab Basic Metabolic Panel: No results for input(s): NA, K, CL, CO2, GLUCOSE, BUN, CREATININE, CALCIUM, MG, PHOS in the last 168 hours. Liver Function Tests: No  results for input(s): AST, ALT, ALKPHOS, BILITOT, PROT, ALBUMIN in the last 168 hours. No results for input(s):  LIPASE, AMYLASE in the last 168 hours. No results for input(s): AMMONIA in the last 168 hours. CBC: No results for input(s): WBC, NEUTROABS, HGB, HCT, MCV, PLT in the last 168 hours. Cardiac Enzymes: No results for input(s): CKTOTAL, CKMB, CKMBINDEX, TROPONINI in the last 168 hours. Sepsis Labs: No results for input(s): PROCALCITON, WBC, LATICACIDVEN in the last 168 hours.  Procedures/Operations     Aquila Delaughter 07/19/2018, 2:37 PM

## 2018-10-10 IMAGING — MR MR LUMBAR SPINE W/O CM
4 of 5 series · 26 of 48 positions shown · non-contrast
Comparison: Lumbar MRI 05/23/2014.  Lumbar spine CT 05/04/2015.

CLINICAL DATA: 61-year-old male with several months of lumbar back
pain. Numbness with some weakness radiating down the entire left leg
to the foot.

EXAM:
MRI LUMBAR SPINE WITHOUT CONTRAST
TECHNIQUE: Multiplanar, multisequence MR imaging of the lumbar spine was
performed. No intravenous contrast was administered.

[Series 2: T2 · sagittal · 4.0mm · 0.84mm/px · 6 of 15 slices shown (1 of 2)]
[im 1/15]
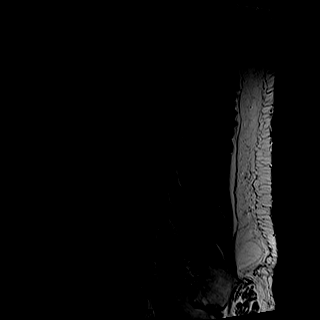
[im 3/15]
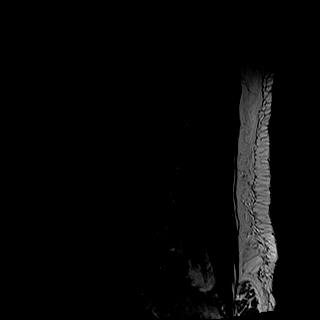
[im 6/15]
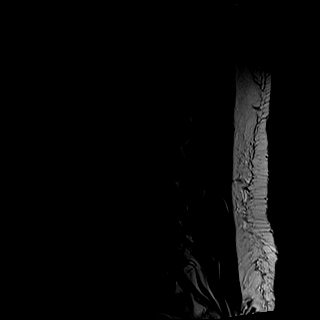
[im 9/15]
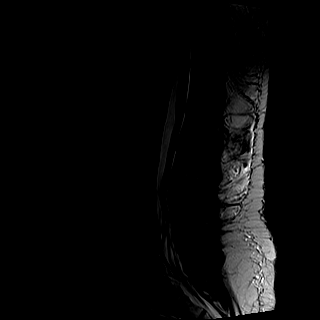
[im 12/15]
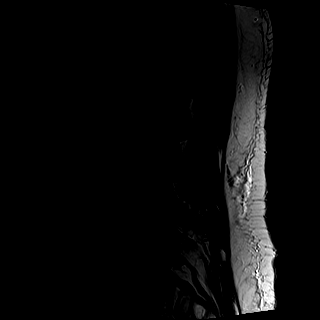
[im 15/15]
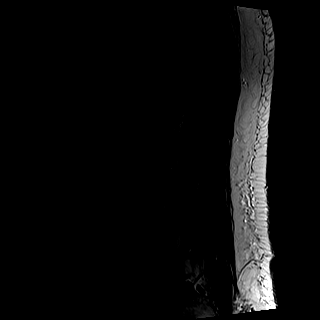

[Series 3: T1 · sagittal · 4.0mm · 0.42mm/px · 5 of 15 slices shown (1 of 2)]
[im 1/15]
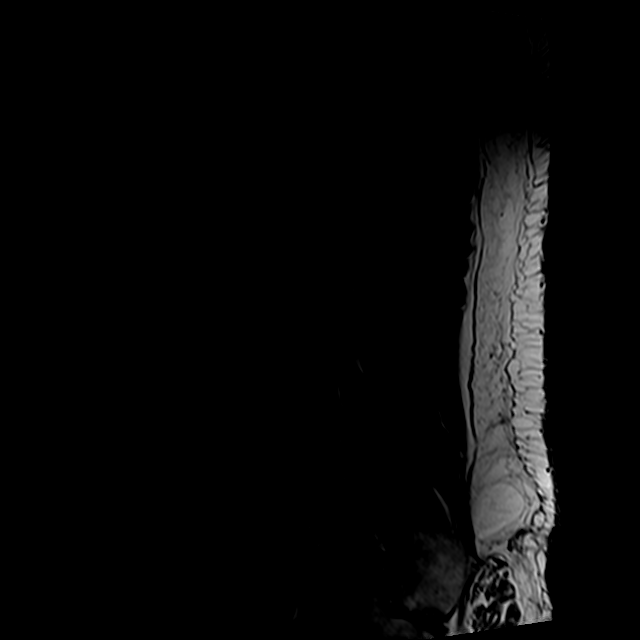
[im 4/15]
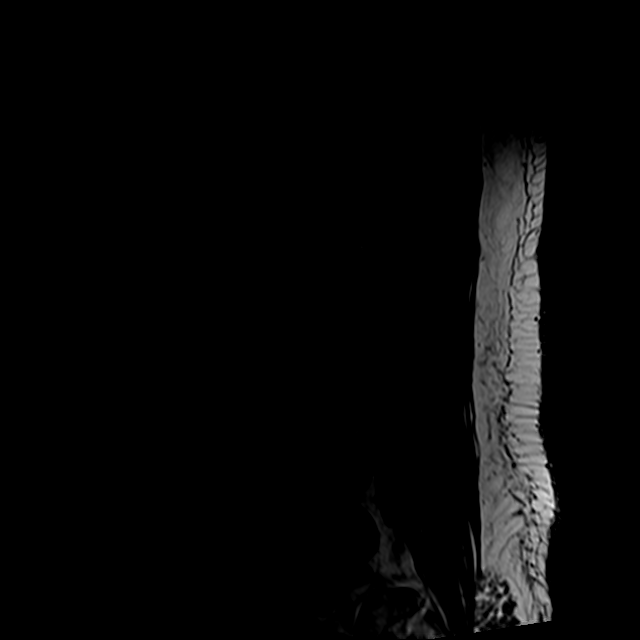
[im 8/15]
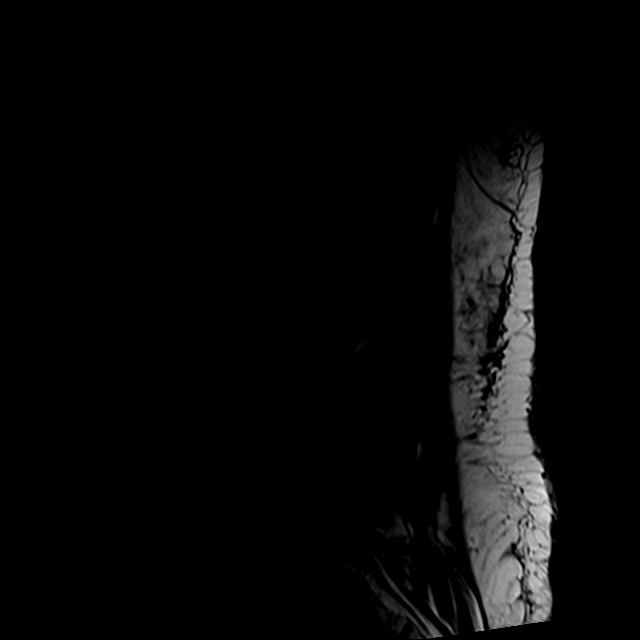
[im 11/15]
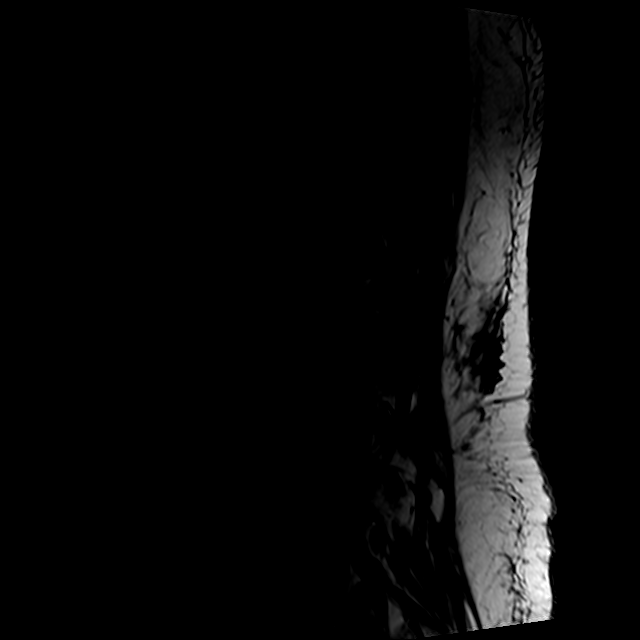
[im 15/15]
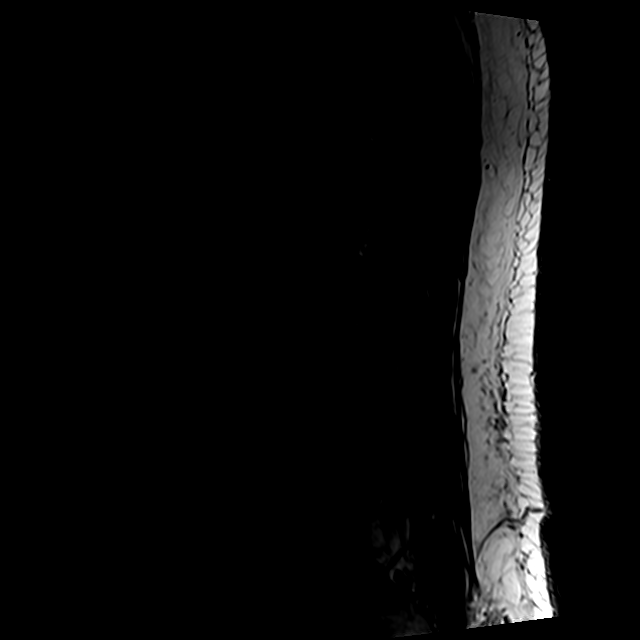

[Series 5: T2 · axial · 4.0mm · 0.78mm/px · z∈[-52,+181]mm · 10 of 46 slices shown (2 of 2)]
[im 4/46]
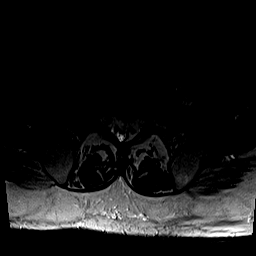
[im 7/46]
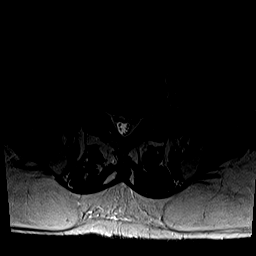
[im 10/46]
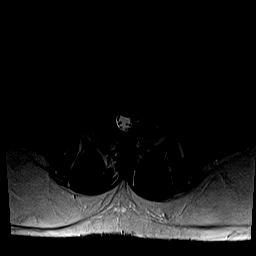
[im 16/46]
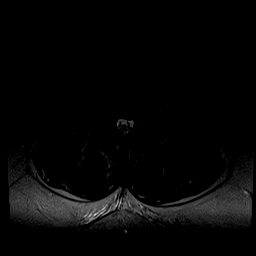
[im 22/46]
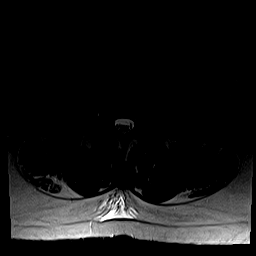
[im 25/46]
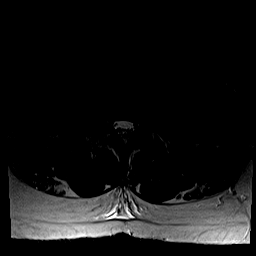
[im 28/46]
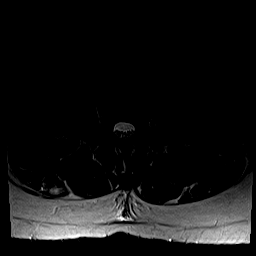
[im 34/46]
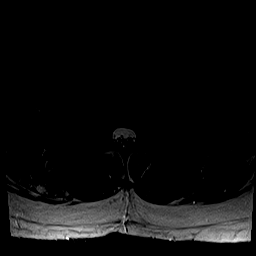
[im 40/46]
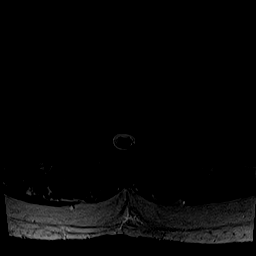
[im 46/46]
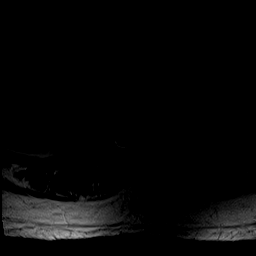

[Series 6: T1 · axial · 4.0mm · 0.39mm/px · z∈[-52,+151]mm · 5 of 46 slices shown (2 of 2)]
[im 4/46]
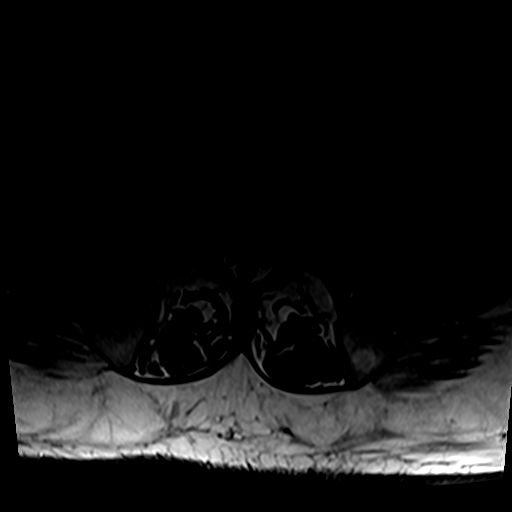
[im 7/46]
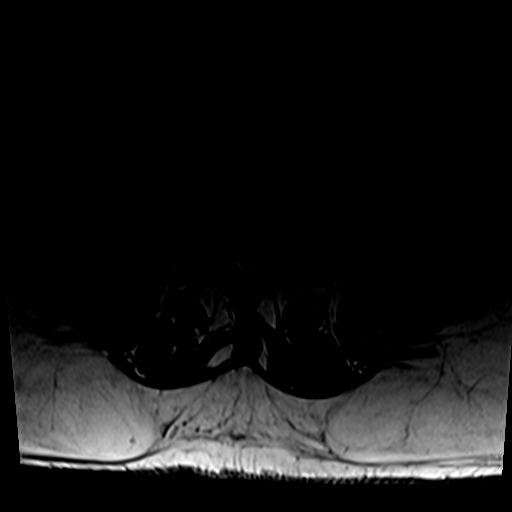
[im 10/46]
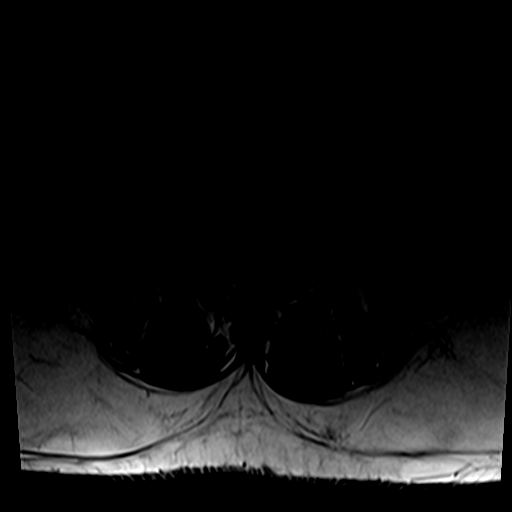
[im 25/46]
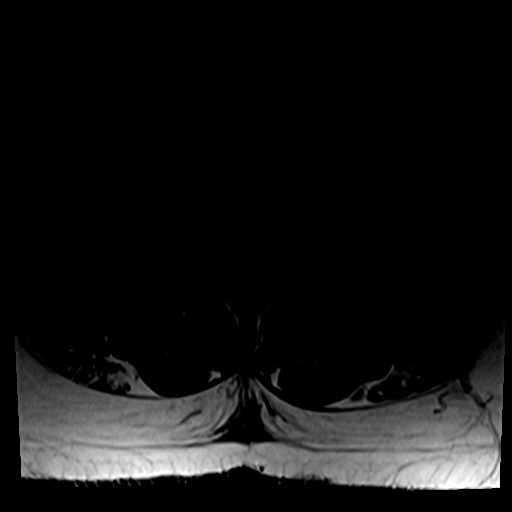
[im 40/46]
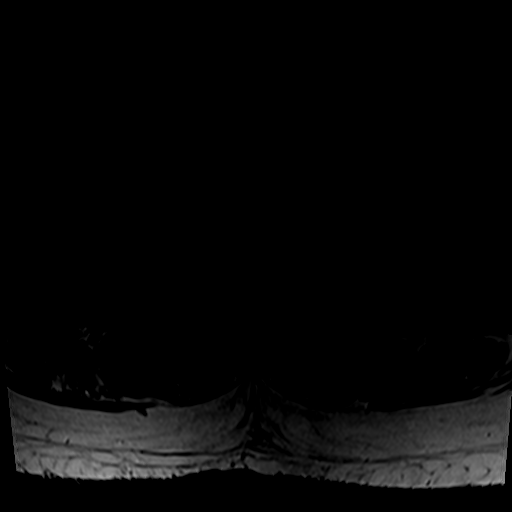

[26 of 48 positions shown; findings below may reference images not displayed]

FINDINGS: Segmentation:  Normal, as seen on the 6979 CT.

Alignment: Stable vertebral height and alignment since 9560. Mild
straightening of lumbar lordosis. Subtle retrolisthesis of L5 on S1.

Vertebrae: Chronic small endplate Schmorl nodes in the lower
thoracic spine. There is mild left far lateral L5 inferior endplate
marrow edema which appears degenerative (series 4, image 12). Normal
underlying bone marrow signal. No other acute osseous abnormality
identified. Intact visible sacrum and SI joints.

Conus medullaris: Extends to the L1 level and appears normal.

Paraspinal and other soft tissues: Visualized abdominal viscera and
paraspinal soft tissues are within normal limits.

Disc levels:

T10-T11:  Mild facet hypertrophy.

T11-T12: Mild disc bulge. Mild to moderate facet hypertrophy. No
significant stenosis.

T12-L1:  Mild left eccentric disc bulge.  No significant stenosis.

L1-L2:  Negative.

L2-L3: Mild far lateral disc bulging. Mild facet hypertrophy.
Borderline to mild left L2 foraminal stenosis is stable.

L3-L4: Chronic broad-based left subarticular and foraminal disc
protrusion with mild facet and ligament flavum hypertrophy.
Borderline to mild left lateral recess stenosis (descending left L4
nerve level) and mild to moderate left L3 neural foraminal stenosis
have not significantly changed. Mild overall spinal stenosis is
increased, in part related to increased epidural lipomatosis (series
5, image 29).

L4-L5: Chronic disc space loss with circumferential disc bulge.
Right far lateral eccentric endplate spurring. Broad-based posterior
component of disc. Mild-to-moderate facet and ligament flavum
hypertrophy. Trace facet joint fluid on the left. No significant
spinal stenosis. Mild right lateral recess stenosis (descending
right L5 nerve root level). Moderate to severe right L4 foraminal
stenosis (series 2, image 4). This level has mildly progressed.

L5-S1: Left eccentric mild circumferential disc bulge with endplate
spurring. Mild facet hypertrophy. Trace bilateral facet joint fluid.
Mild epidural lipomatosis. No significant spinal or lateral recess
stenosis. Mild bilateral L5 foraminal stenosis. This level is
stable.
IMPRESSION: 1. Chronic mild to moderate left side stenosis at L3-L4 related to
disc and facet degeneration appears not significantly changed since
9560, although multifactorial mild spinal stenosis at that level has
increased. Query left L3 and/or L4 radiculitis.
2. Mild degenerative appearing left lateral L5 endplate marrow edema
could also be a source for recent left side low back pain.
3. Progressed chronic L4-L5 degeneration with up to severe right L4
neural foraminal stenosis and mild right lateral recess stenosis.
4. Other lumbar levels are stable since 9560, including up to mild
left L2 and bilateral L5 neural foraminal stenosis.

## 2018-10-19 IMAGING — CR DG CHEST 2V
1 series · 2 of 2 positions shown · non-contrast
Comparison: 08/18/2016

CLINICAL DATA: Chest pain

EXAM:
CHEST  2 VIEW

[Series 1: dg chest 2 view · 0.14mm/px · 2 of 2 slices shown]
[im 1/2]
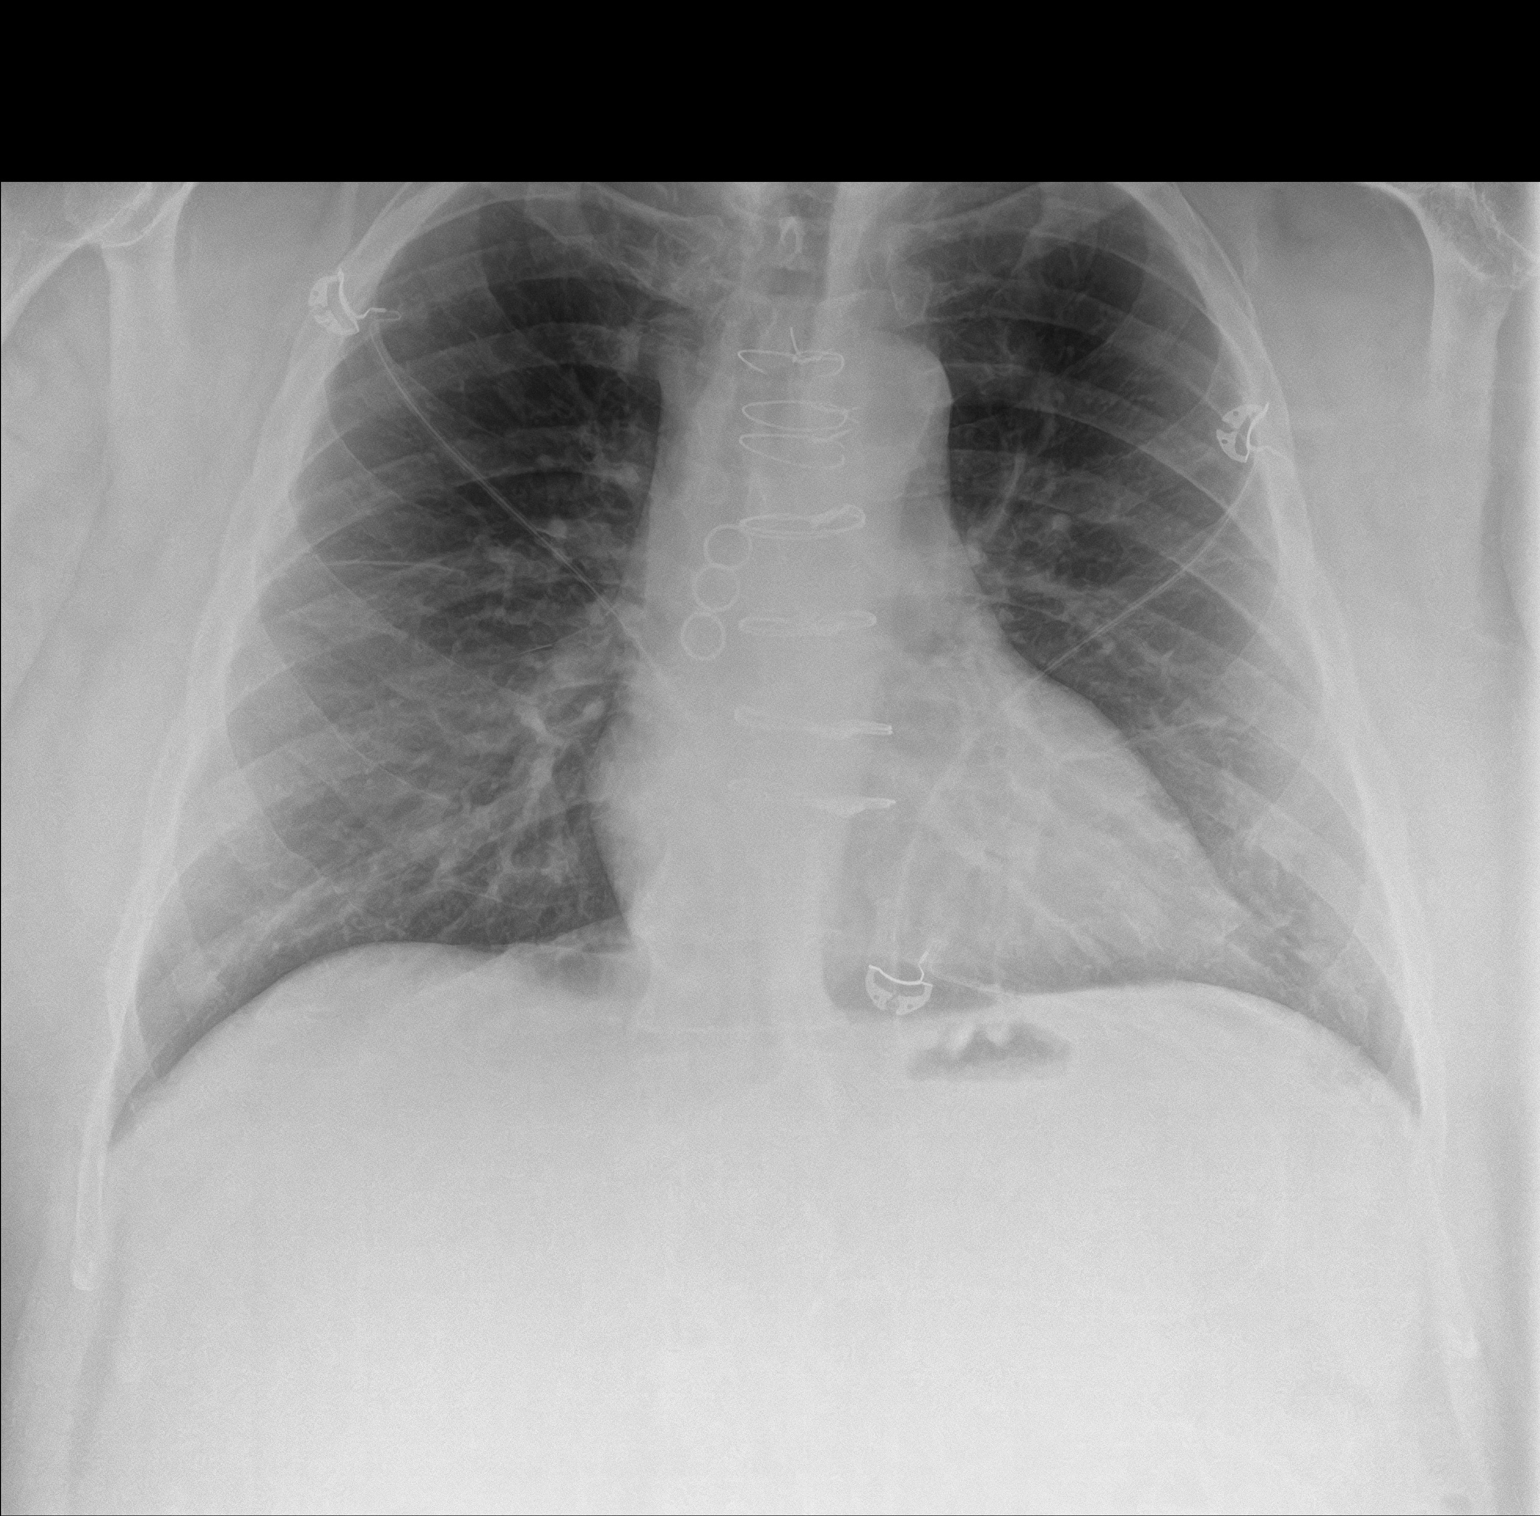
[im 2/2]
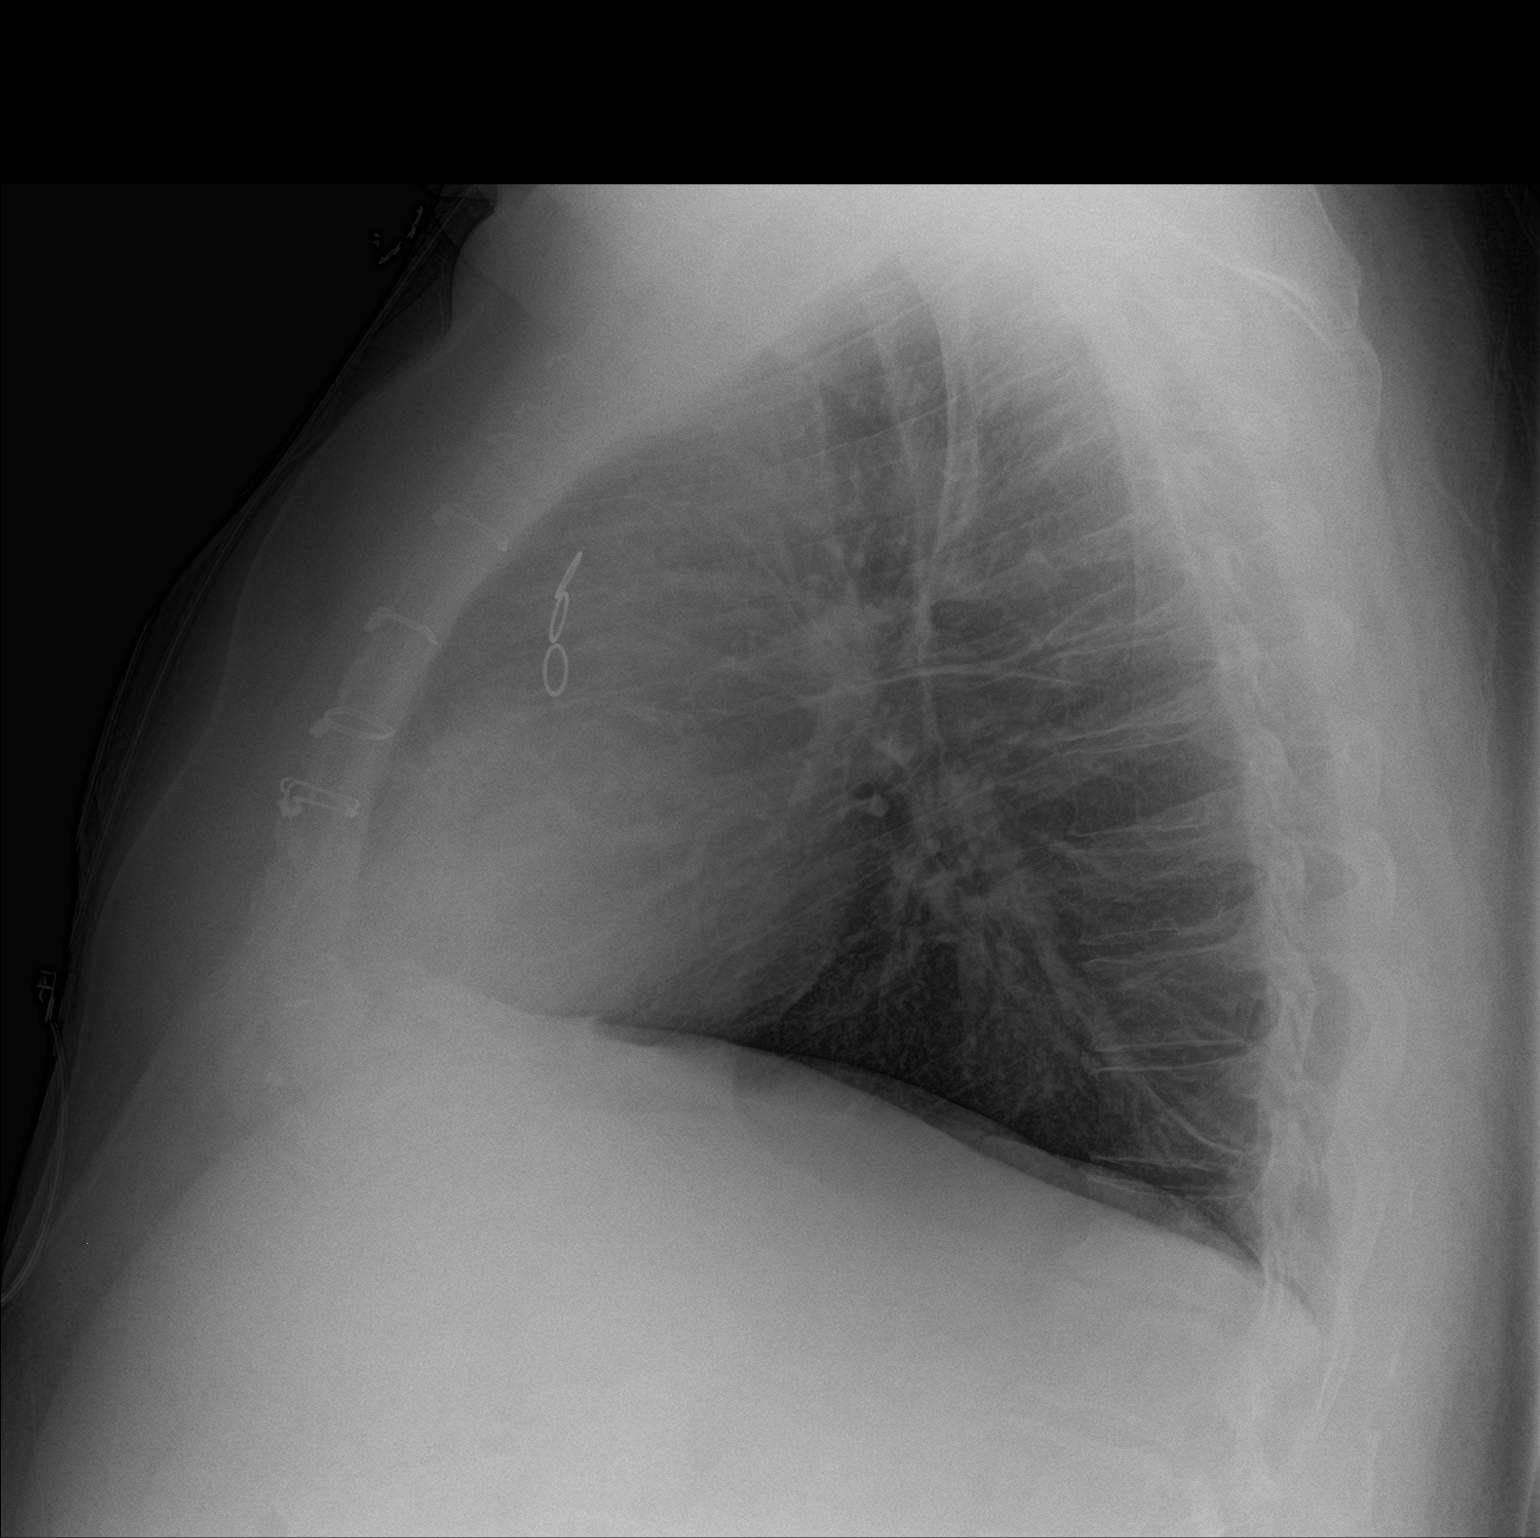

[2 of 2 positions shown; findings below may reference images not displayed]

FINDINGS: Postop CABG. Negative for heart failure. Negative for infiltrate
effusion or mass. Mild scarring bilaterally in the bases.
IMPRESSION: No active cardiopulmonary disease.

## 2018-10-27 IMAGING — DX DG CHEST 1V
1 series · 1 of 1 positions shown · non-contrast
Comparison: Radiographs 918

CLINICAL DATA: Cough.  Dizziness.

EXAM:
CHEST 1 VIEW

[chest ap]
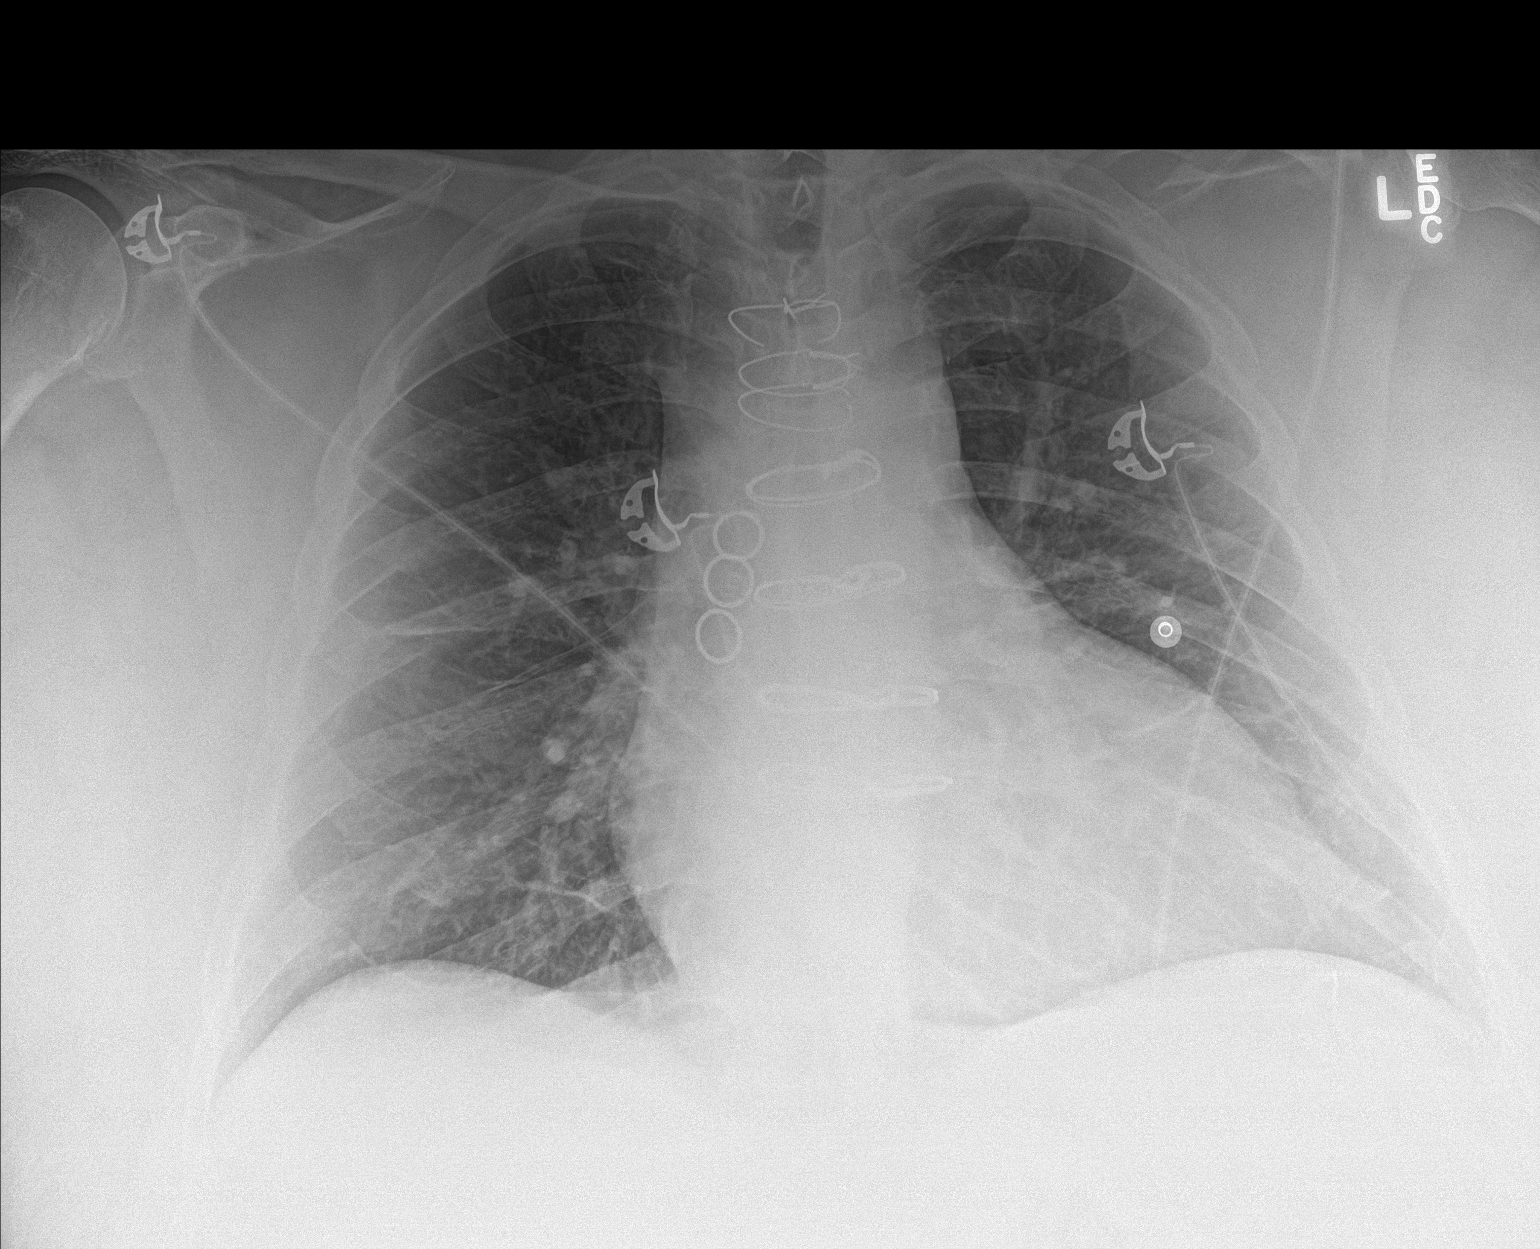

[1 of 1 positions shown; findings below may reference images not displayed]

FINDINGS: Post median sternotomy and CABG. Unchanged heart size and
mediastinal contours allowing for differences in technique. No
consolidation, pulmonary edema, pleural fluid or pneumothorax.
Stable osseous structures.
IMPRESSION: No acute abnormality.

## 2018-10-31 IMAGING — MR MR HEAD W/O CM
10 series · 48 of 48 positions shown · non-contrast
Comparison: CT studies 02/28/2017.

CLINICAL DATA: Frontal headache.  Dizziness, nausea and vomiting.

EXAM:
MRI HEAD WITHOUT CONTRAST
TECHNIQUE: Multiplanar, multiecho pulse sequences of the brain and surrounding
structures were obtained without intravenous contrast.

[Series 3: DWI · axial · 3.0mm · 1.25mm/px · z∈[-62,+105]mm · 5 of 58 slices shown (1 of 4)]
[im 1/58]
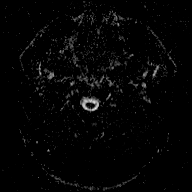
[im 15/58]
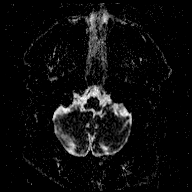
[im 29/58]
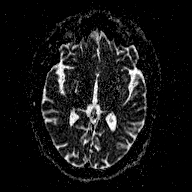
[im 43/58]
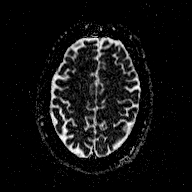
[im 58/58]
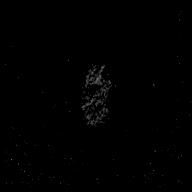

[Series 5: DWI · coronal · 3.0mm · 1.20mm/px · 5 of 54 slices shown (2 of 4)]
[im 1/54]
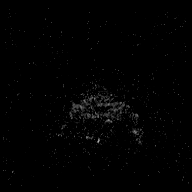
[im 14/54]
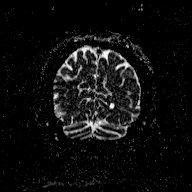
[im 27/54]
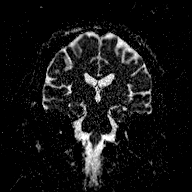
[im 40/54]
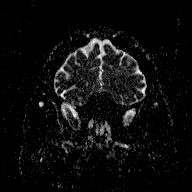
[im 54/54]
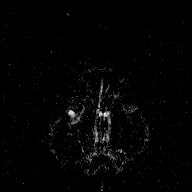

[Series 6: T1 · sagittal · 5.0mm · 0.45mm/px · 2 of 23 slices shown (1 of 2)]
[im 1/23]
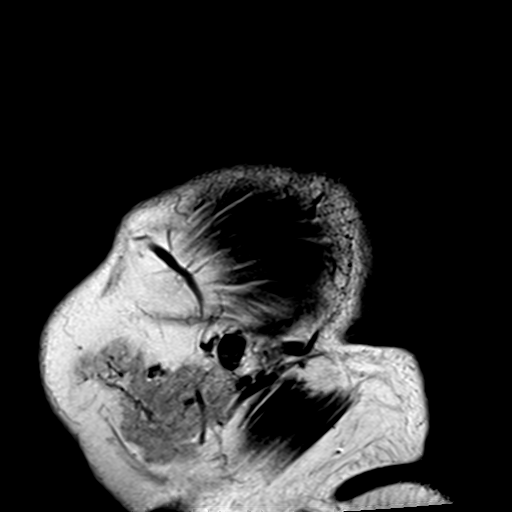
[im 23/23]
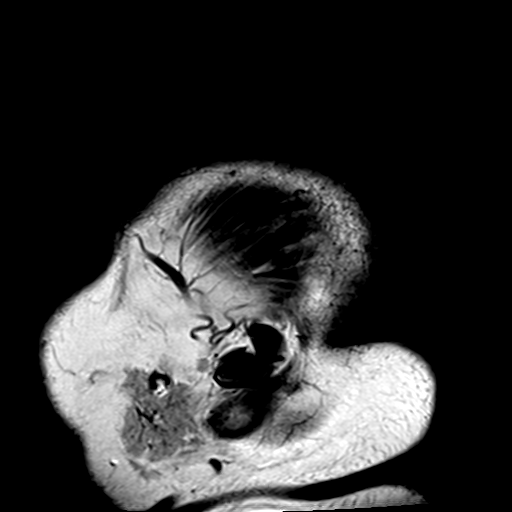

[Series 7: T2 · axial · 5.0mm · 0.72mm/px · z∈[-65,+106]mm · 2 of 26 slices shown (1 of 3)]
[im 1/26]
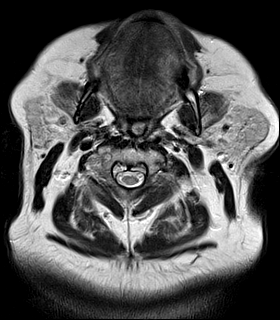
[im 26/26]
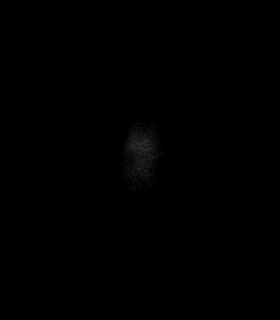

[Series 8: FLAIR · axial · 3.0mm · 0.45mm/px · z∈[-59,+99]mm · 5 of 55 slices shown]
[im 1/55]
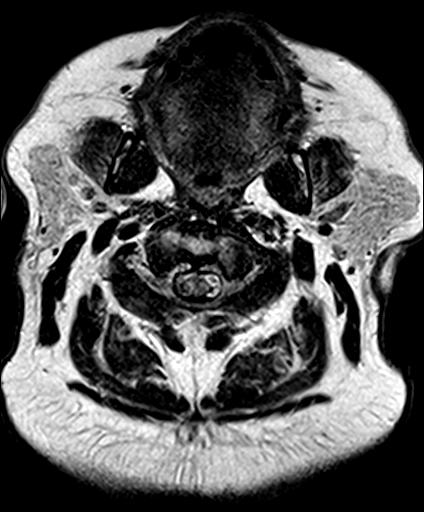
[im 14/55]
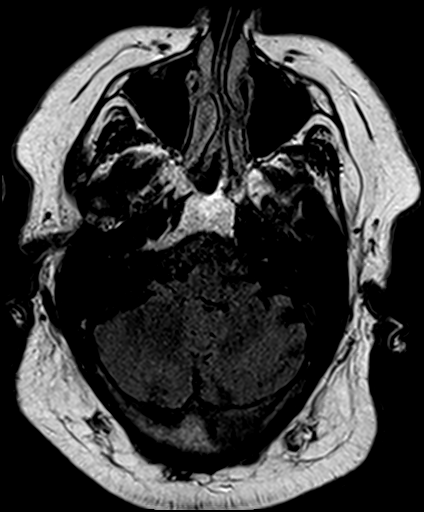
[im 28/55]
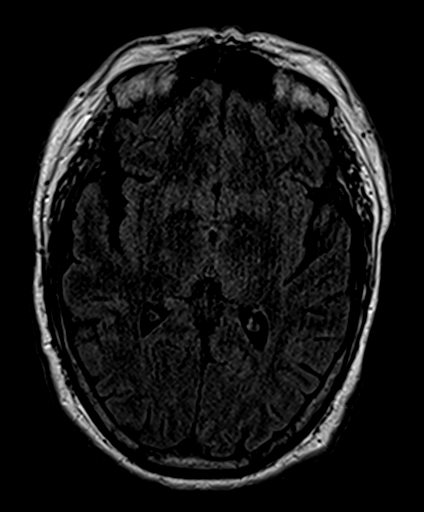
[im 41/55]
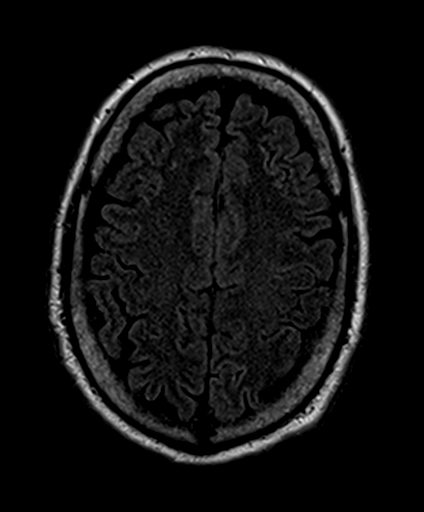
[im 55/55]
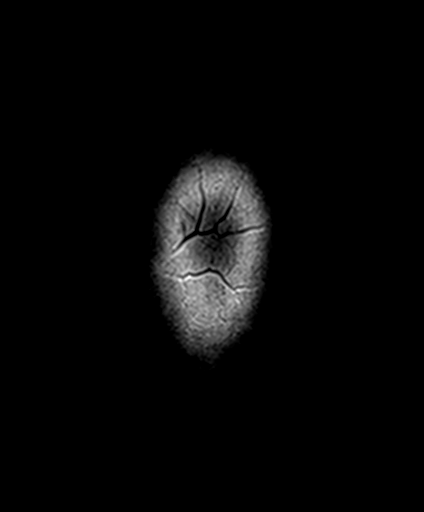

[Series 9: T2 · axial · 5.0mm · 0.72mm/px · z∈[-65,+106]mm · 2 of 26 slices shown (2 of 3)]
[im 1/26]
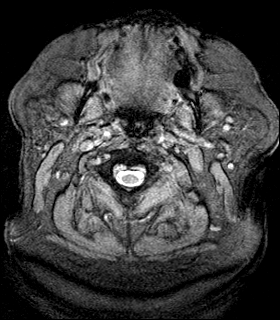
[im 26/26]
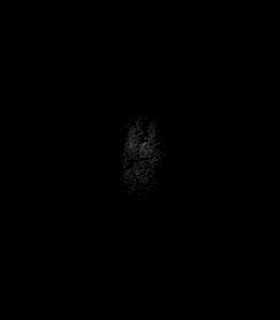

[Series 10: T1 · axial · 1.0mm · 1.00mm/px · z∈[-55,+101]mm · 14 of 160 slices shown (2 of 2)]
[im 1/160]
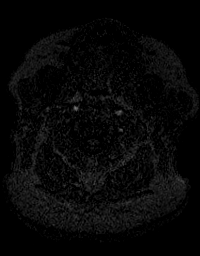
[im 13/160]
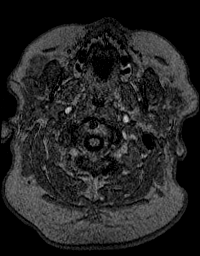
[im 25/160]
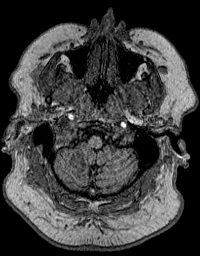
[im 37/160]
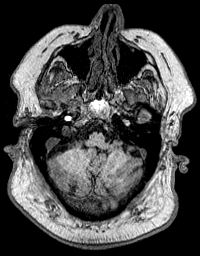
[im 49/160]
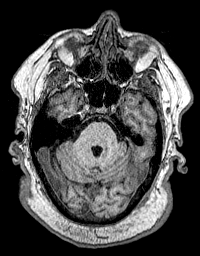
[im 62/160]
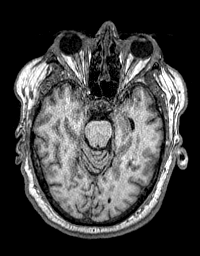
[im 74/160]
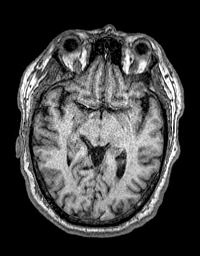
[im 86/160]
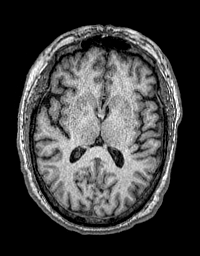
[im 98/160]
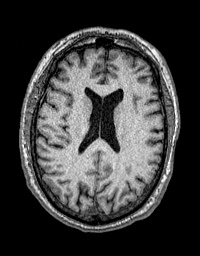
[im 111/160]
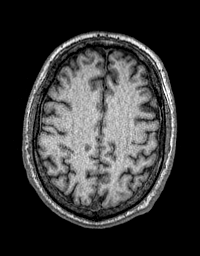
[im 123/160]
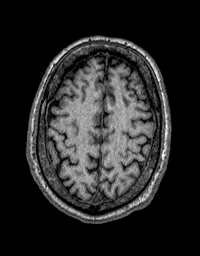
[im 135/160]
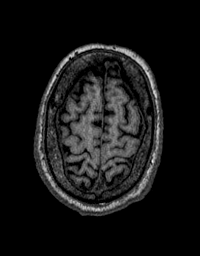
[im 147/160]
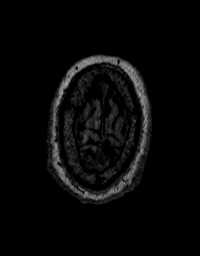
[im 160/160]
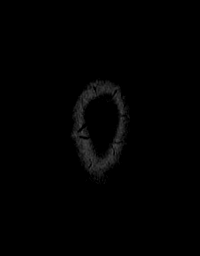

[Series 11: T2 · coronal · 5.0mm · 0.45mm/px · 3 of 33 slices shown (3 of 3)]
[im 1/33]
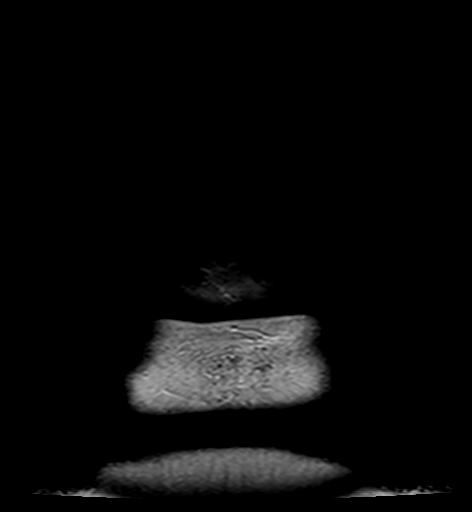
[im 17/33]
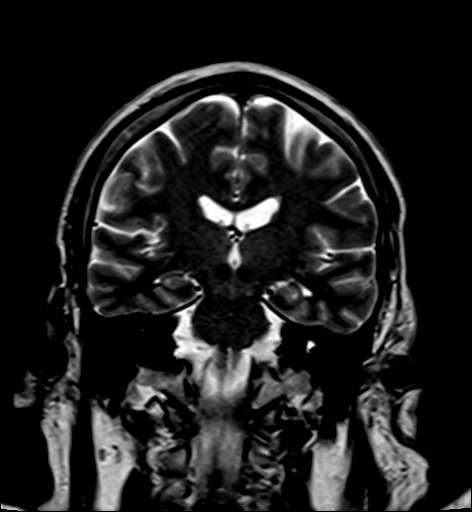
[im 33/33]
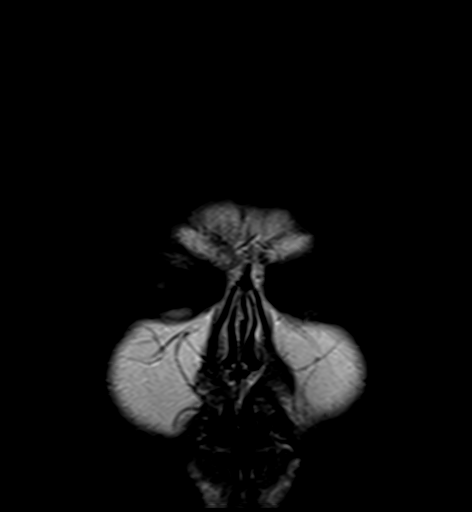

[Series 100: DWI · axial · 3.0mm · 1.25mm/px · z∈[-62,+105]mm · 5 of 58 slices shown (3 of 4)]
[im 1/58]
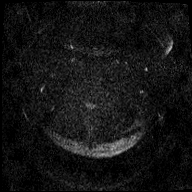
[im 15/58]
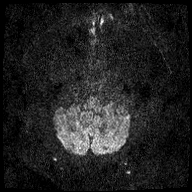
[im 29/58]
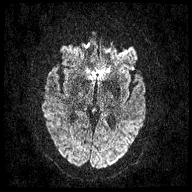
[im 43/58]
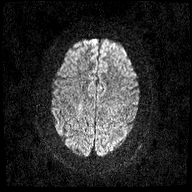
[im 58/58]
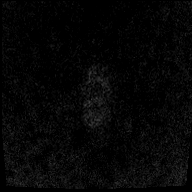

[Series 101: DWI · coronal · 3.0mm · 1.20mm/px · 5 of 54 slices shown (4 of 4)]
[im 1/54]
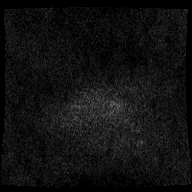
[im 14/54]
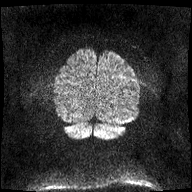
[im 27/54]
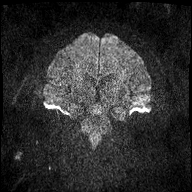
[im 40/54]
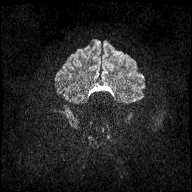
[im 54/54]
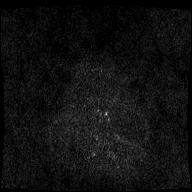

[48 of 48 positions shown; findings below may reference images not displayed]

FINDINGS: Brain: Diffusion imaging shows acute/subacute infarction within the
inferior cerebellum on the right. No other acute infarction. Mild
swelling but no hemorrhage or significant mass effect. Elsewhere,
the brainstem, left cerebellum and cerebral hemispheres are normal.
No mass lesion, hydrocephalus or extra-axial collection. No chronic
ischemic changes.

Vascular: Absent flow in the distal right vertebral artery. Left
vertebral artery shows flow. Flow in both carotid systems.

Skull and upper cervical spine: Negative

Sinuses/Orbits: Clear/normal

Other: None significant
IMPRESSION: Acute/subacute infarction in the inferior cerebellum on the right
consistent with PICA territory. Abnormal flow in the distal right
vertebral artery.

The remainder the examination is normal.

## 2018-11-19 IMAGING — CT CT CHEST W/O CM
2 of 4 series · 14 of 36 positions shown, 17 images · non-contrast
Comparison: CT scan of July 19, 2016.

CLINICAL DATA: Shortness of breath, acute left-sided abdominal
pain.

EXAM:
CT CHEST, ABDOMEN AND PELVIS WITHOUT CONTRAST
TECHNIQUE: Multidetector CT imaging of the chest, abdomen and pelvis was
performed following the standard protocol without IV contrast.

[Series 2: axial st · axial · 0.98mm/px · z∈[-688,-133]mm · 11 of 135 slices shown, 14 images]
[im 12/135  mediastinal]
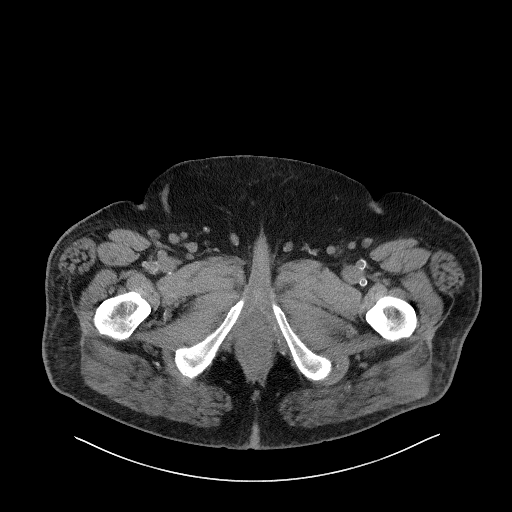
[im 12/135  lung]
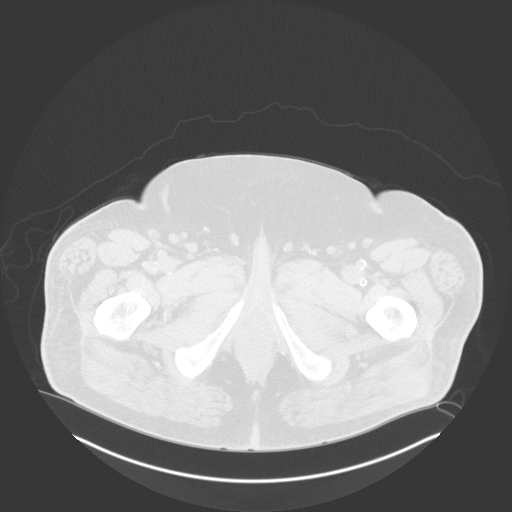
[im 23/135  lung]
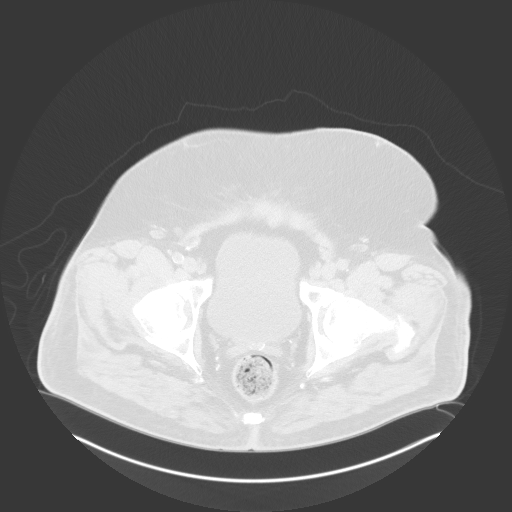
[im 34/135  lung]
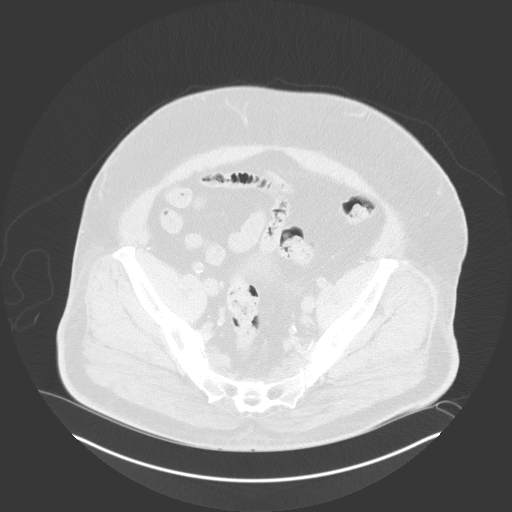
[im 45/135  lung]
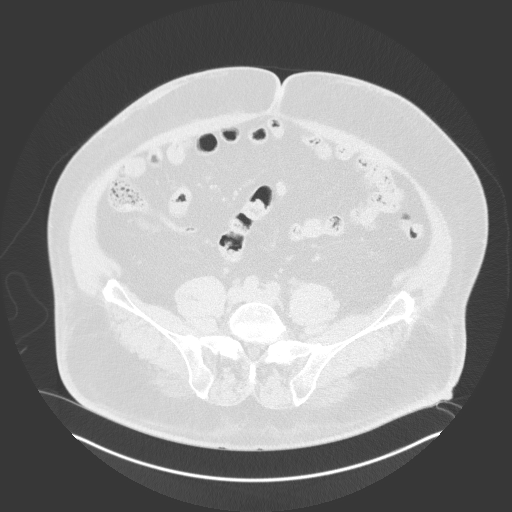
[im 56/135  mediastinal]
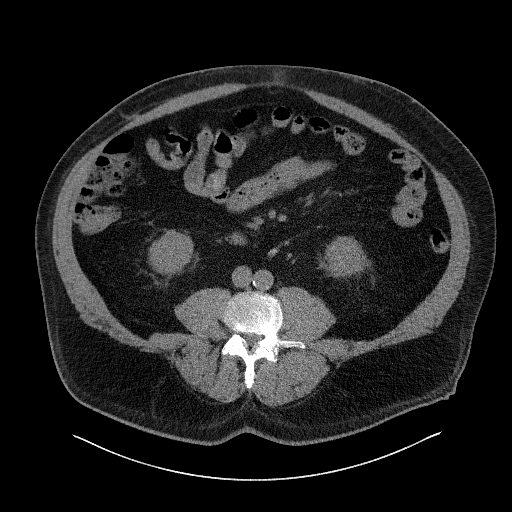
[im 56/135  lung]
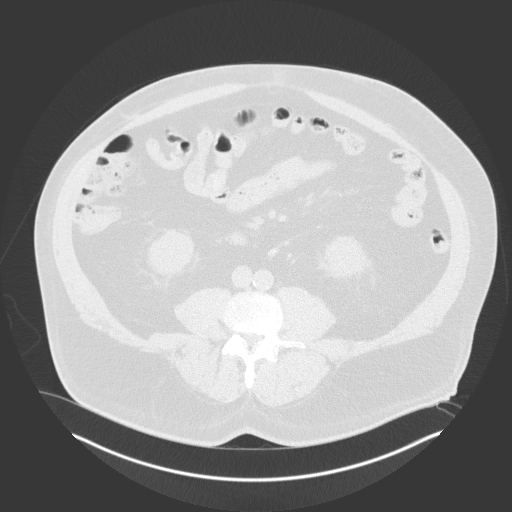
[im 68/135  lung]
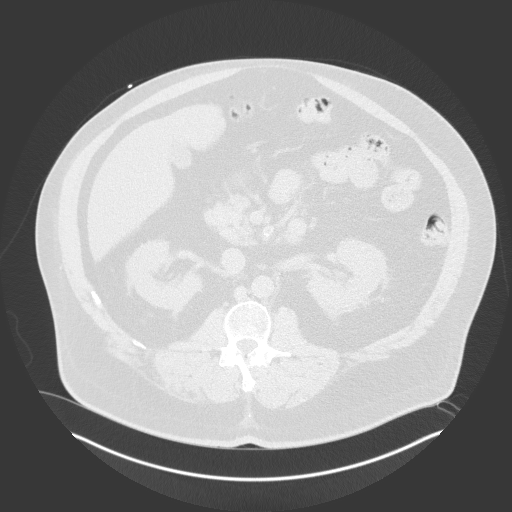
[im 79/135  lung]
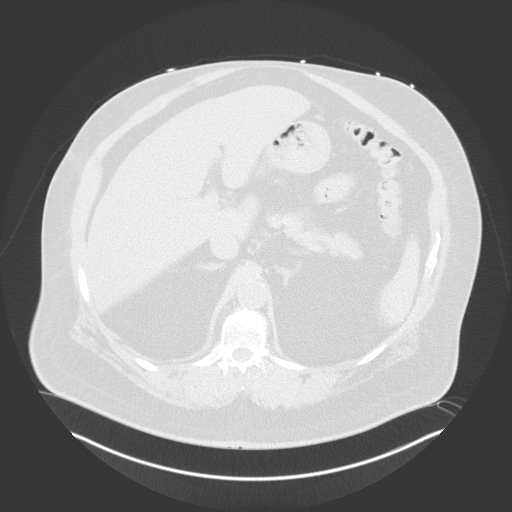
[im 90/135  lung]
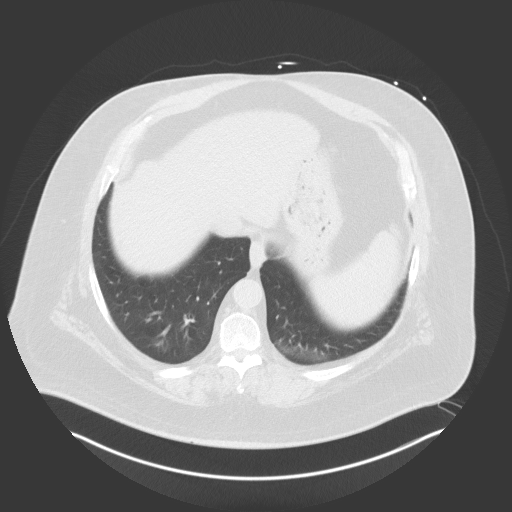
[im 101/135  mediastinal]
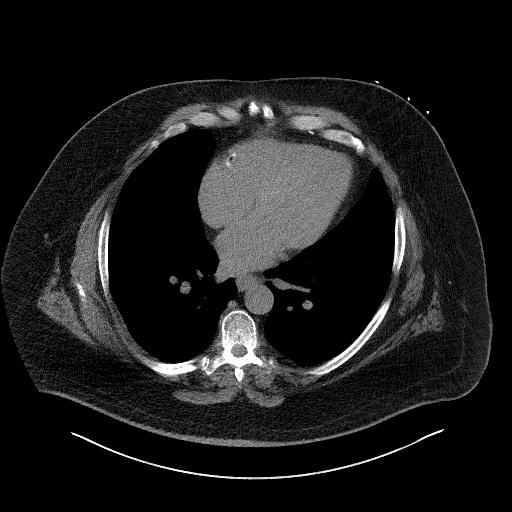
[im 101/135  lung]
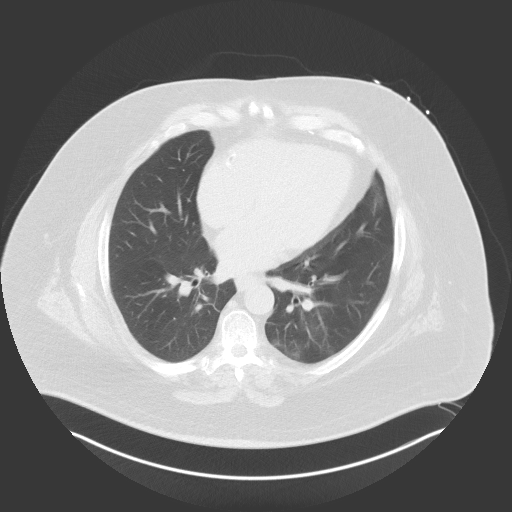
[im 112/135  lung]
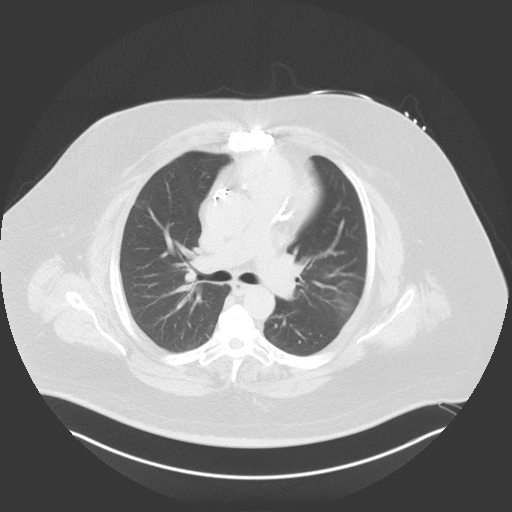
[im 123/135  lung]
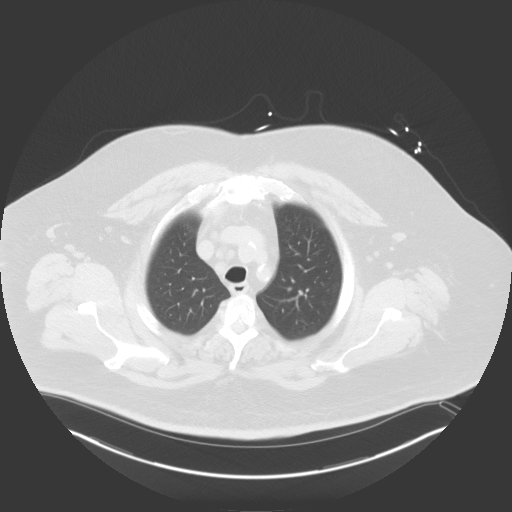

[Series 5: coronal · coronal · 0.96mm/px · 3 of 197 slices shown]
[im 40/197  lung]
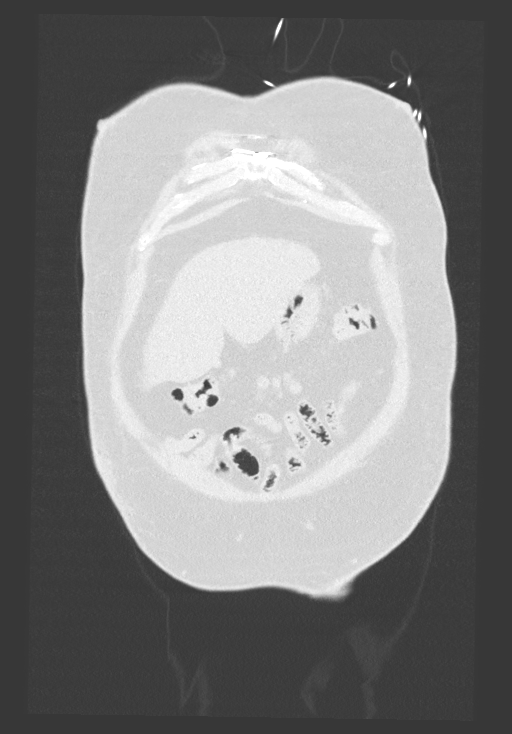
[im 79/197  lung]
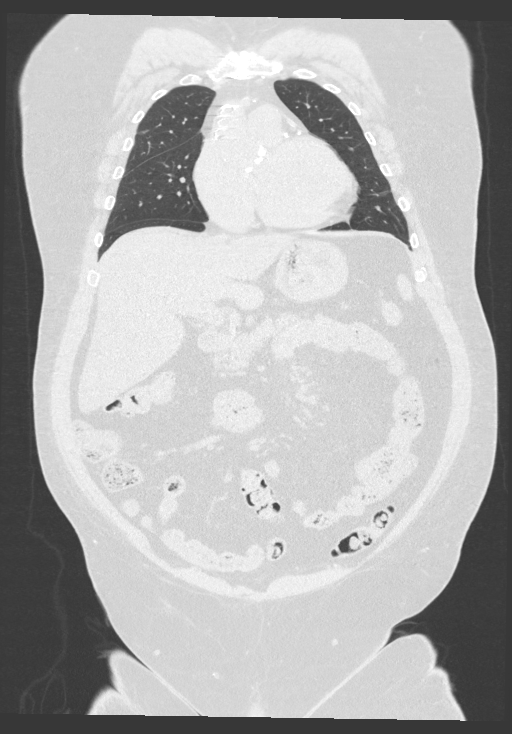
[im 118/197  lung]
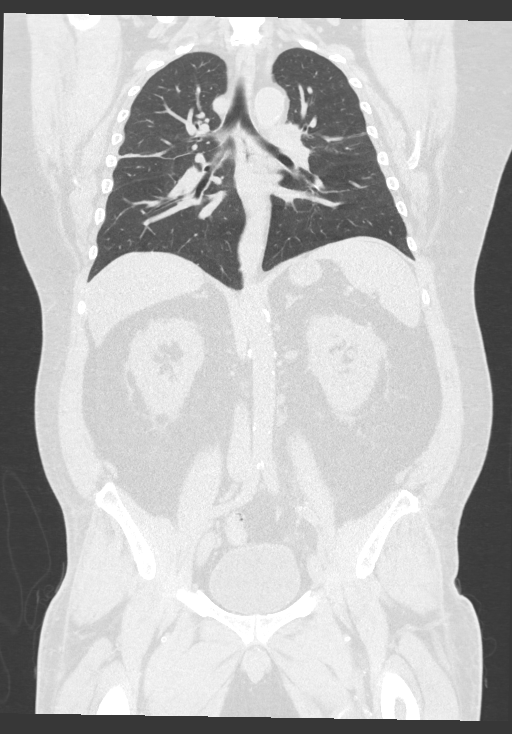

[14 of 36 positions shown; findings below may reference images not displayed]

FINDINGS: CT CHEST FINDINGS

Cardiovascular: Atherosclerosis of thoracic aorta is noted without
aneurysm formation. Status post coronary artery bypass graft. No
pericardial effusion is noted.

Mediastinum/Nodes: No enlarged mediastinal, hilar, or axillary lymph
nodes. Thyroid gland, trachea, and esophagus demonstrate no
significant findings.

Lungs/Pleura: No pneumothorax or pleural effusion is noted. Stable
mild bilateral lung scarring is noted.

Musculoskeletal: No chest wall mass or suspicious bone lesions
identified.

CT ABDOMEN PELVIS FINDINGS

Hepatobiliary: No focal liver abnormality is seen. No gallstones,
gallbladder wall thickening, or biliary dilatation.

Pancreas: Unremarkable. No pancreatic ductal dilatation or
surrounding inflammatory changes.

Spleen: Normal in size without focal abnormality.

Adrenals/Urinary Tract: Adrenal glands are unremarkable. Kidneys are
normal, without renal calculi, focal lesion, or hydronephrosis.
Bladder is unremarkable.

Stomach/Bowel: Stomach is within normal limits. Appendix appears
normal. No evidence of bowel wall thickening, distention, or
inflammatory changes.

Vascular/Lymphatic: Aortic atherosclerosis. No enlarged abdominal or
pelvic lymph nodes.

Reproductive: Prostate is unremarkable.

Other: Mild bilateral inguinal adenopathy is noted which most likely
is reactive in etiology. No abnormal fluid collection is noted.

Musculoskeletal: No acute or significant osseous findings.
IMPRESSION: Aortic atherosclerosis.

No significant abnormality seen in the chest, abdomen or pelvis.

## 2018-11-19 IMAGING — CR DG CHEST 2V
1 series · 2 of 2 positions shown · non-contrast
Comparison: Chest x-ray dated March 01, 2017.

CLINICAL DATA: Shortness of breath.

EXAM:
CHEST  2 VIEW

[Series 1: dg chest 2 view · 0.14mm/px · 2 of 2 slices shown]
[im 1/2]
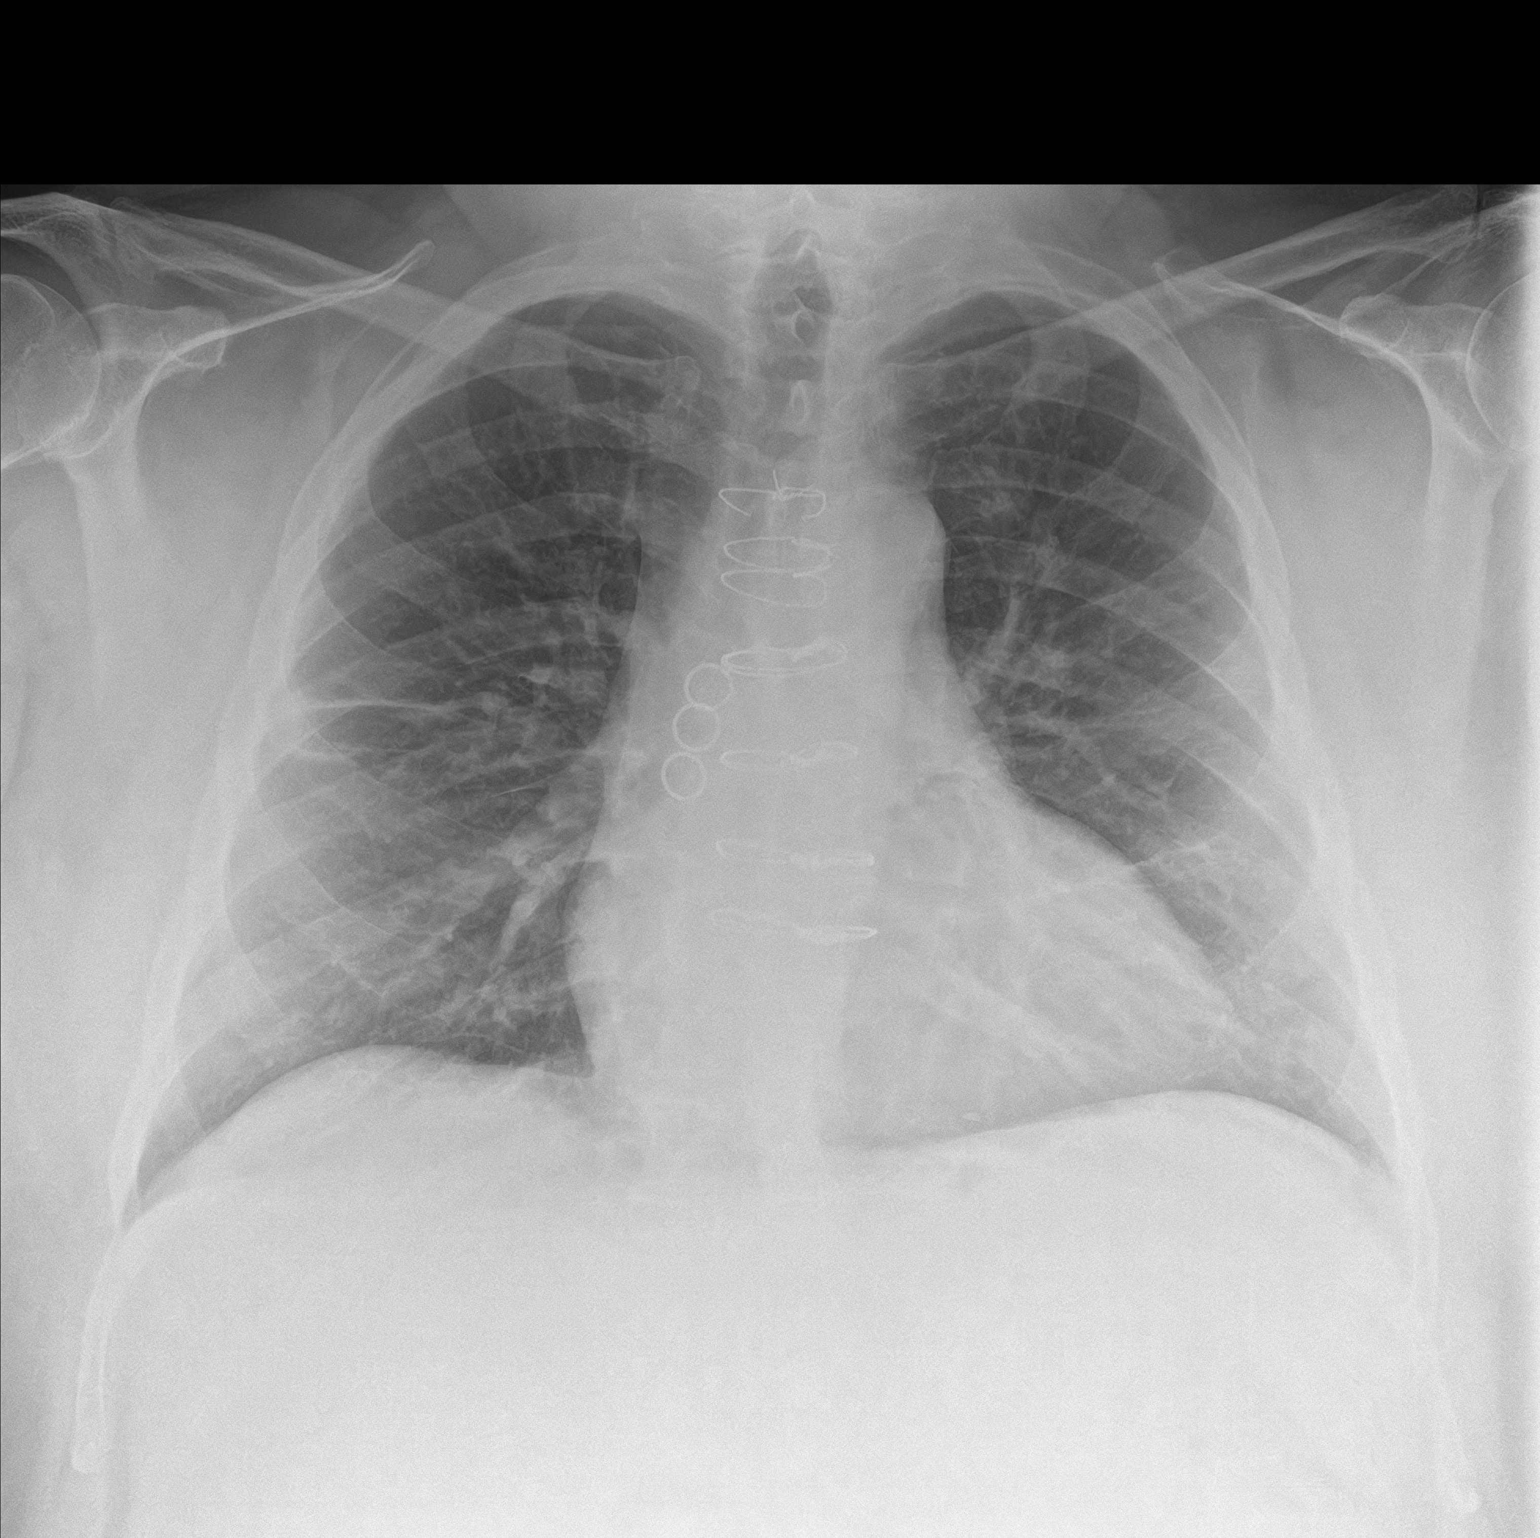
[im 2/2]
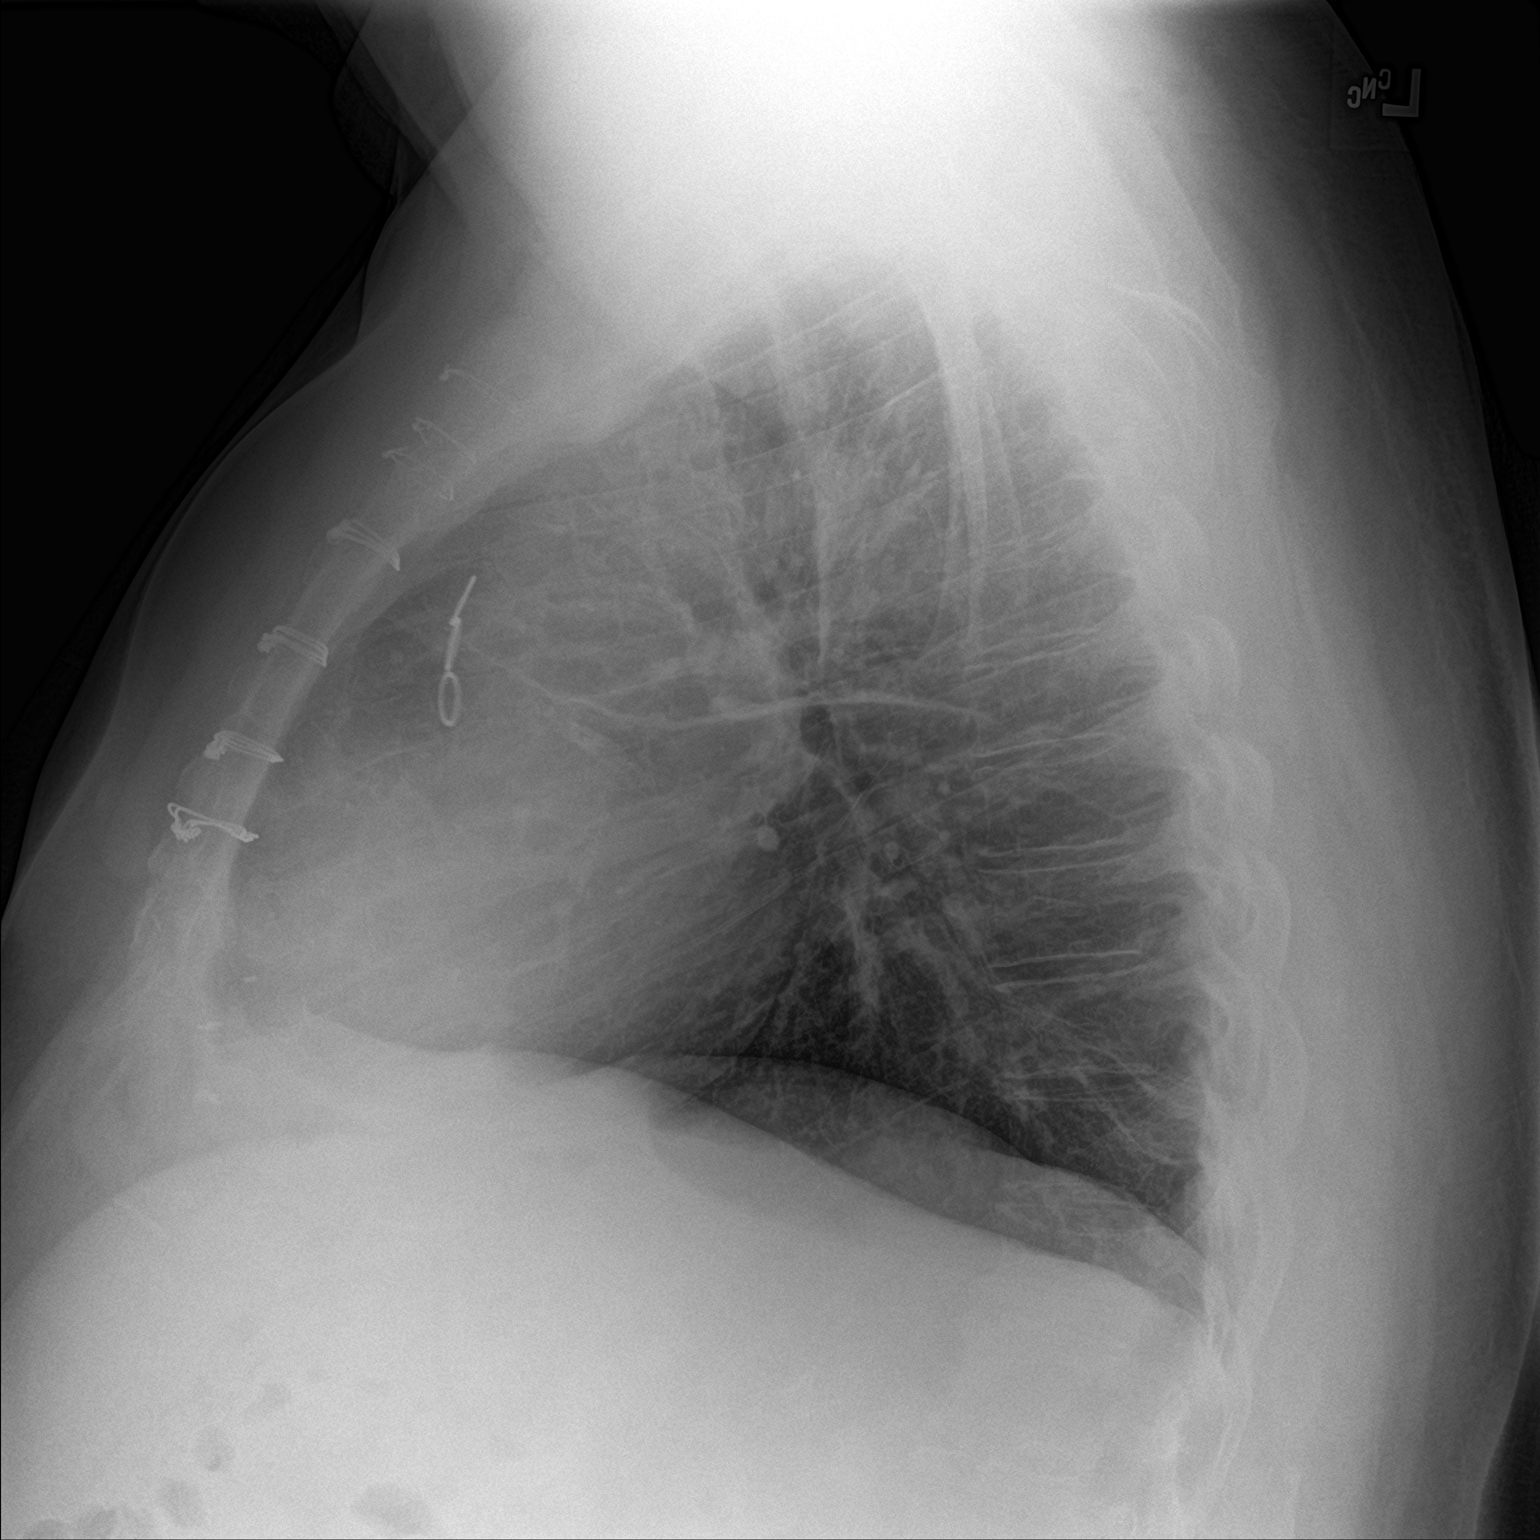

[2 of 2 positions shown; findings below may reference images not displayed]

FINDINGS: Postsurgical changes related to prior CABG. Fracture of the superior
most first through third sternal wires again noted. The
cardiomediastinal silhouette is normal in size. Mild pulmonary
vascular congestion. Scattered areas of linear atelectasis/scarring
are similar to prior study. No focal consolidation, pleural
effusion, or pneumothorax. No acute osseous abnormality.
IMPRESSION: Prior CABG.  Mild pulmonary vascular congestion without overt edema.

## 2019-01-24 IMAGING — CR DG HIP (WITH OR WITHOUT PELVIS) 2-3V*R*
1 series · 3 of 3 positions shown · non-contrast
Comparison: None.

CLINICAL DATA: Right hip pain, no known injury, initial encounter

EXAM:
DG HIP (WITH OR WITHOUT PELVIS) 2-3V RIGHT

[Series 1: dg hip unilat w or w/o pelvis 2-3 views  · non-contrast · 0.14mm/px · 3 of 3 slices shown]
[im 1/3]
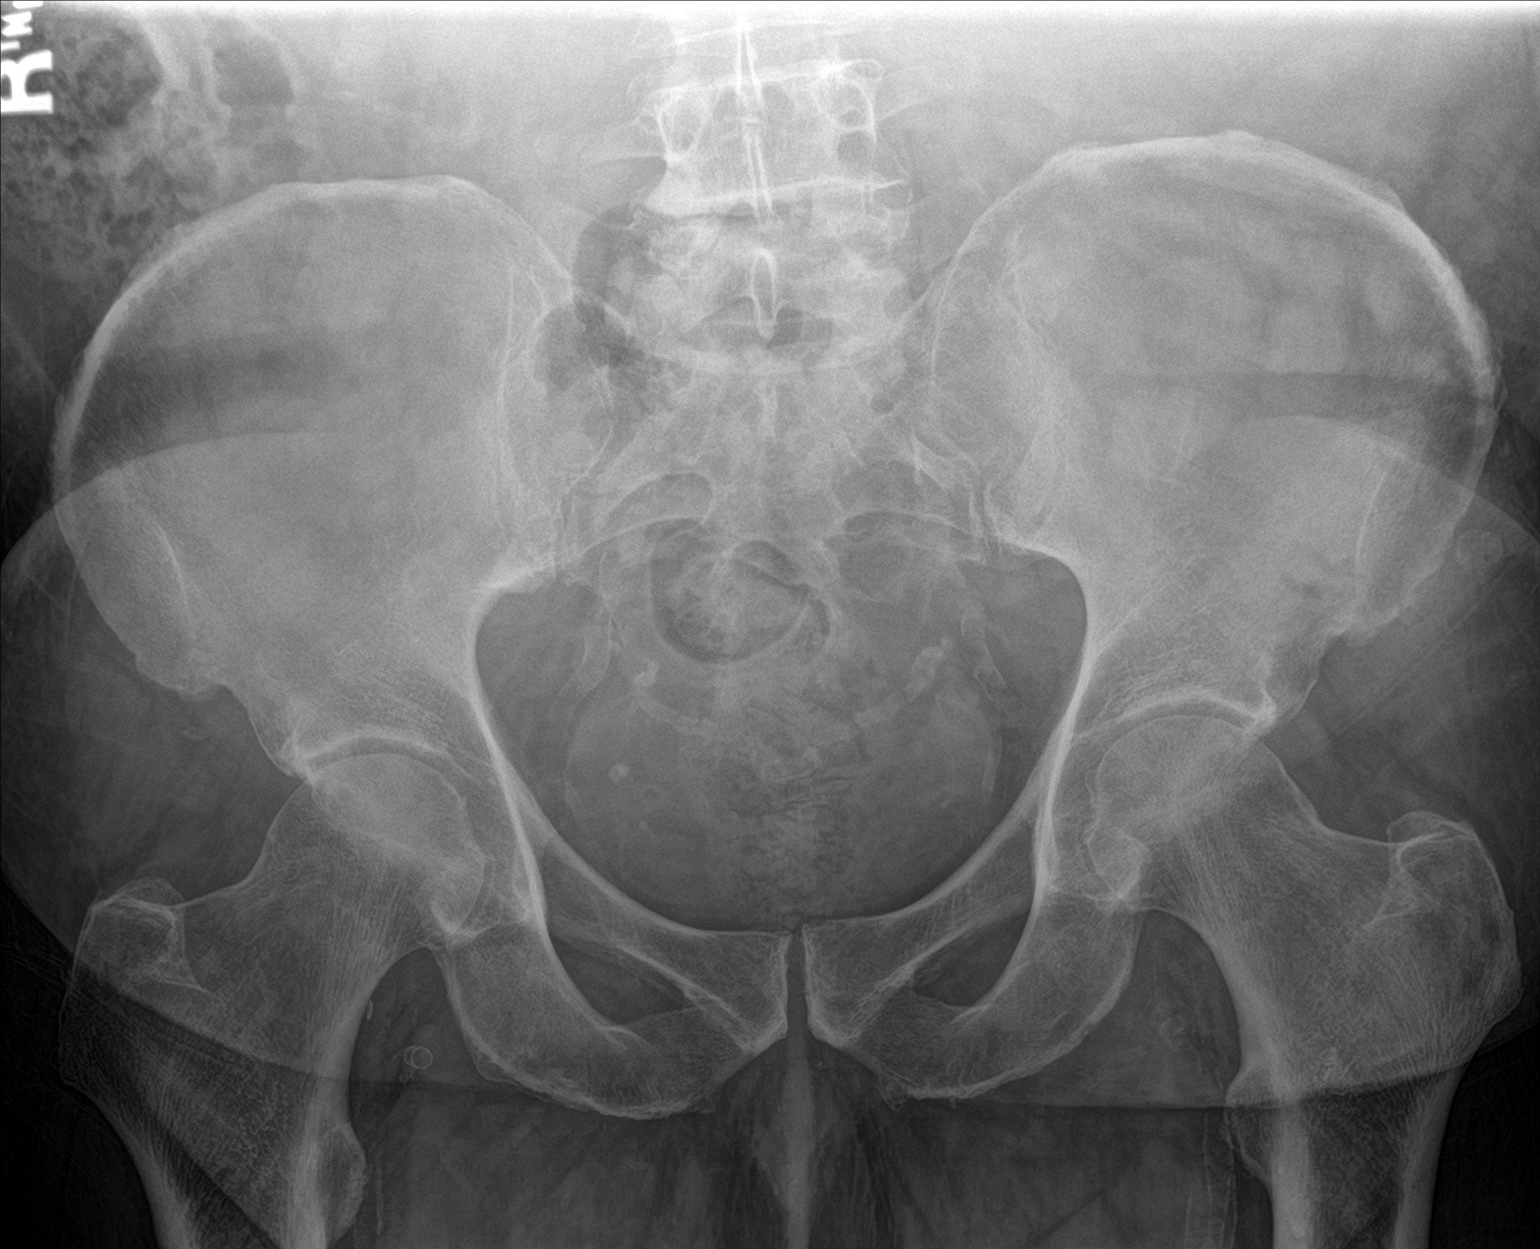
[im 2/3]
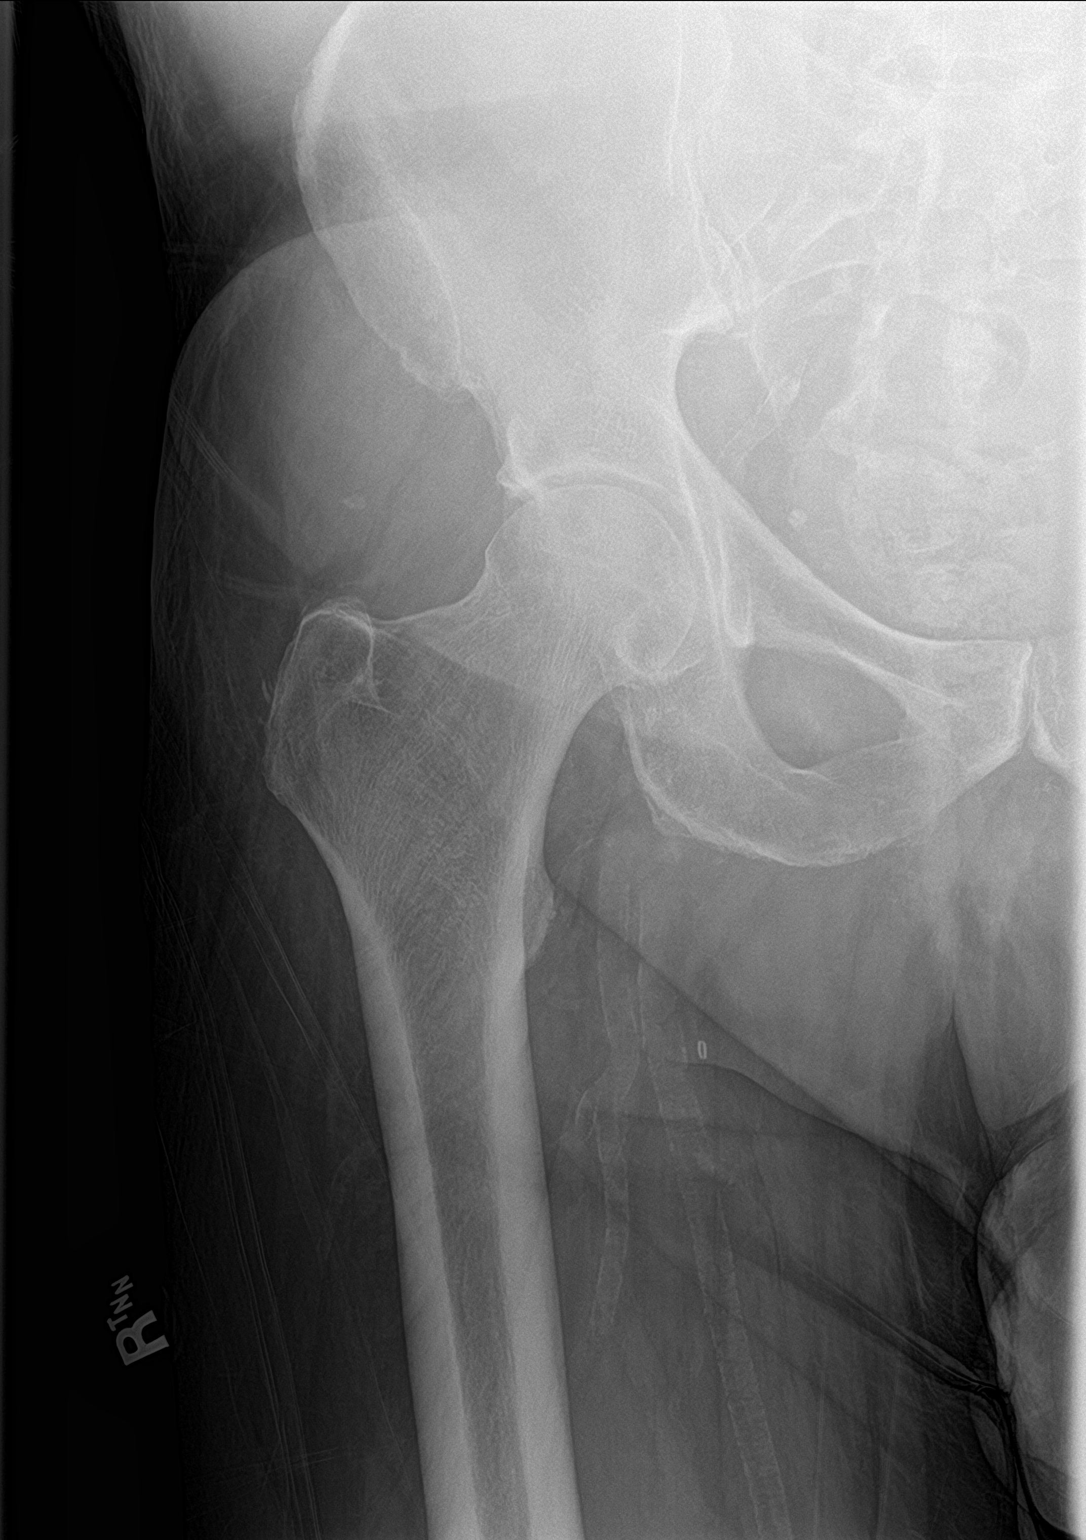
[im 3/3]
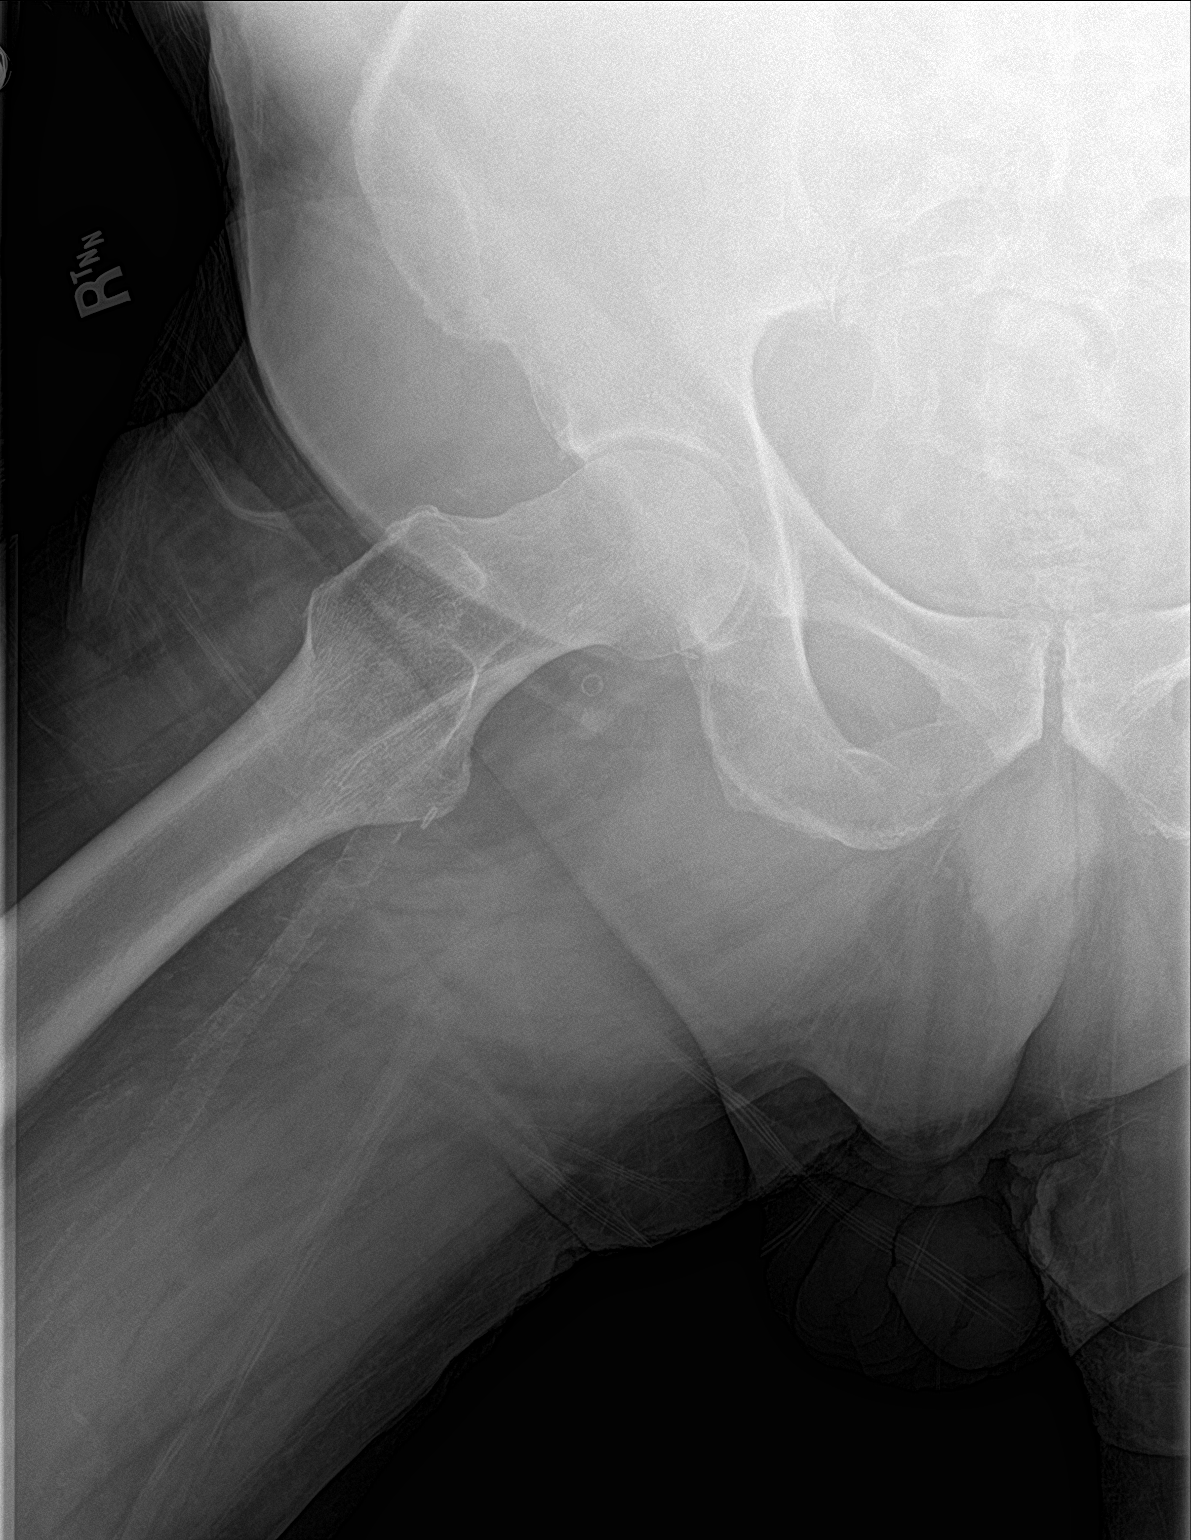

[3 of 3 positions shown; findings below may reference images not displayed]

FINDINGS: Pelvic ring is intact. No acute fracture or dislocation is noted. No
soft tissue changes are seen. Vascular calcifications noted.
IMPRESSION: No acute abnormality noted

## 2019-02-18 IMAGING — MR MR LUMBAR SPINE W/O CM
5 series · 42 of 48 positions shown · IV contrast (agent unspecified)
Comparison: 03/24/2017 CT abdomen and pelvis. 02/12/2017 lumbar
spine MRI

CLINICAL DATA: Two days of left lower back pain unrelieved by
medication.

EXAM:
MRI lumbar spine without contrast
TECHNIQUE: T2 sagittal, T1 sagittal, stir sagittal, T2 axial, T1 axial images
of the lumbar spine were acquired.
CONTRAST:  None
RADIOPHARMACEUTICALS:  None
FLUOROSCOPY TIME:  None

[Series 2: T2 · sagittal · 4.0mm · 1.02mm/px · 6 of 17 slices shown (1 of 2)]
[im 1/17]
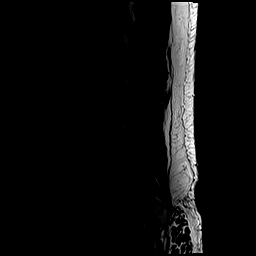
[im 4/17]
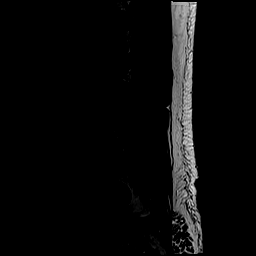
[im 7/17]
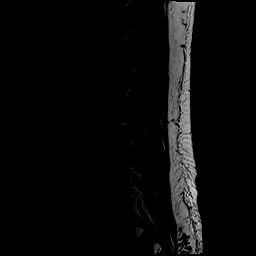
[im 10/17]
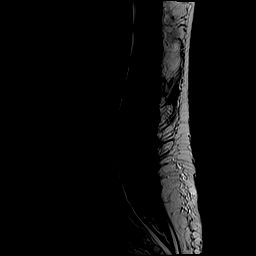
[im 13/17]
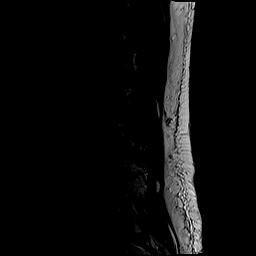
[im 17/17]
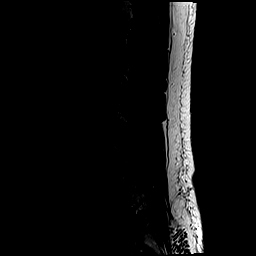

[Series 3: T1 · sagittal · 4.0mm · 1.02mm/px · 7 of 17 slices shown (1 of 2)]
[im 1/17]
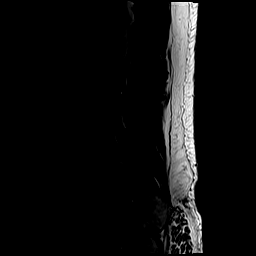
[im 3/17]
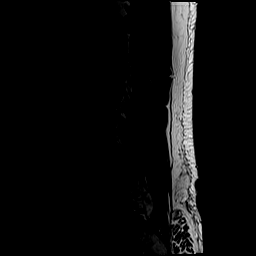
[im 6/17]
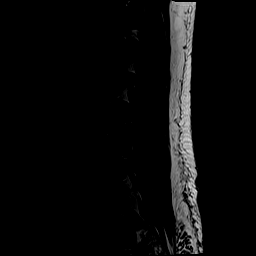
[im 9/17]
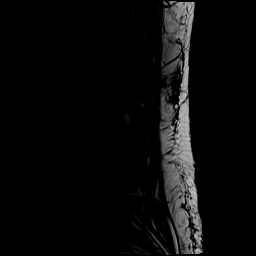
[im 11/17]
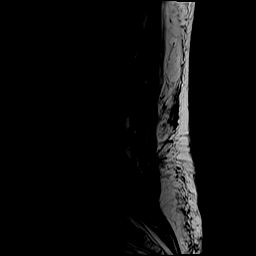
[im 14/17]
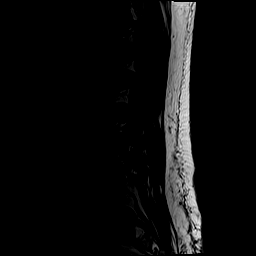
[im 17/17]
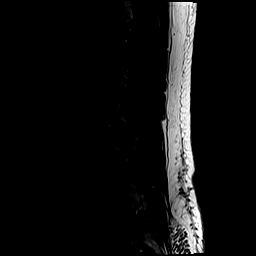

[Series 4: STIR · sagittal · 4.0mm · 1.02mm/px · 7 of 17 slices shown]
[im 1/17]
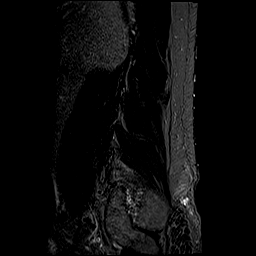
[im 3/17]
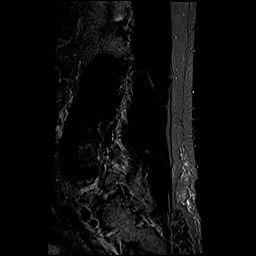
[im 6/17]
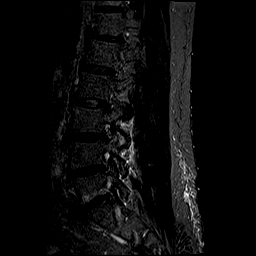
[im 9/17]
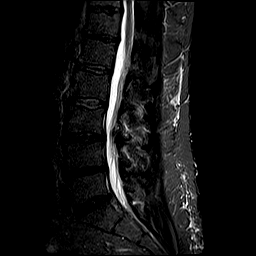
[im 11/17]
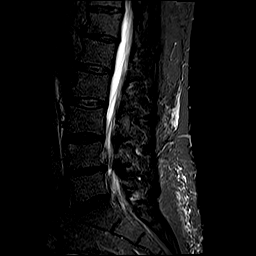
[im 14/17]
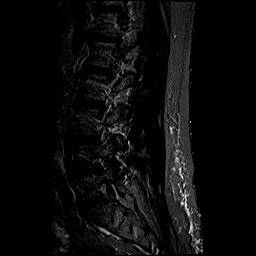
[im 17/17]
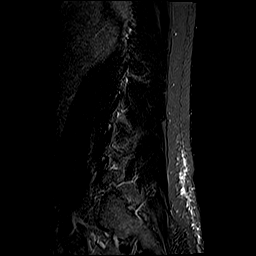

[Series 5: T2 · axial · 4.0mm · 0.78mm/px · z∈[-125,+69]mm · 14 of 37 slices shown (2 of 2)]
[im 1/37]
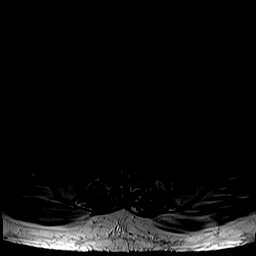
[im 3/37]
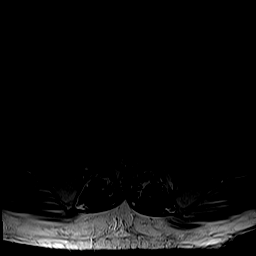
[im 6/37]
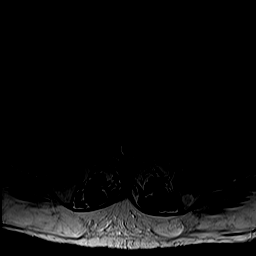
[im 9/37]
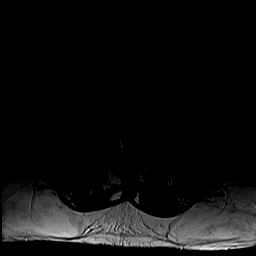
[im 12/37]
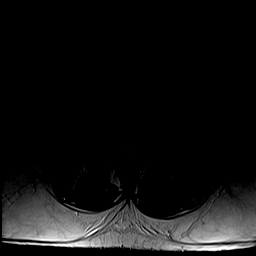
[im 14/37]
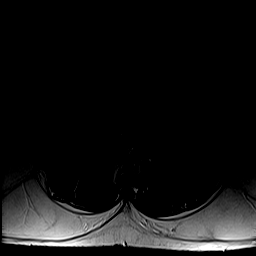
[im 17/37]
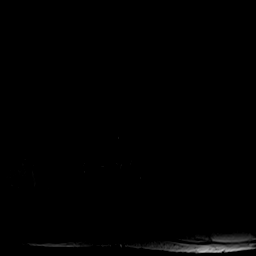
[im 20/37]
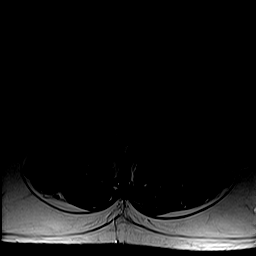
[im 23/37]
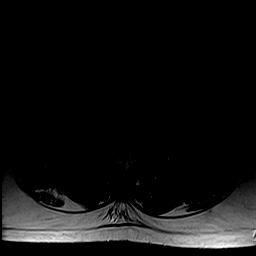
[im 25/37]
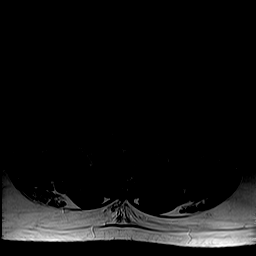
[im 28/37]
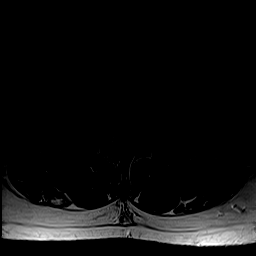
[im 31/37]
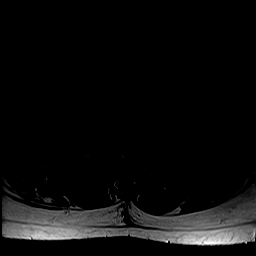
[im 34/37]
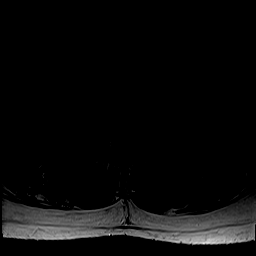
[im 37/37]
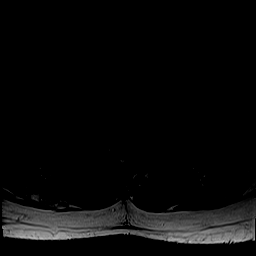

[Series 6: T1 · axial · 4.0mm · 0.39mm/px · z∈[-125,+69]mm · 8 of 37 slices shown (2 of 2)]
[im 1/37]
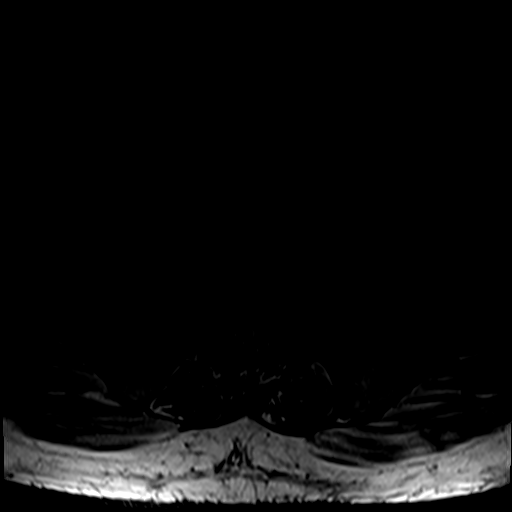
[im 6/37]
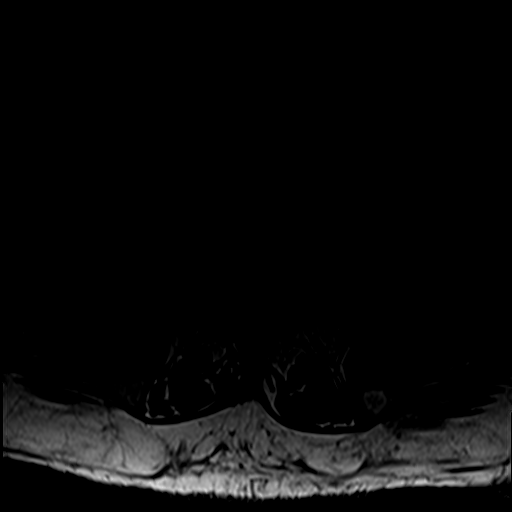
[im 12/37]
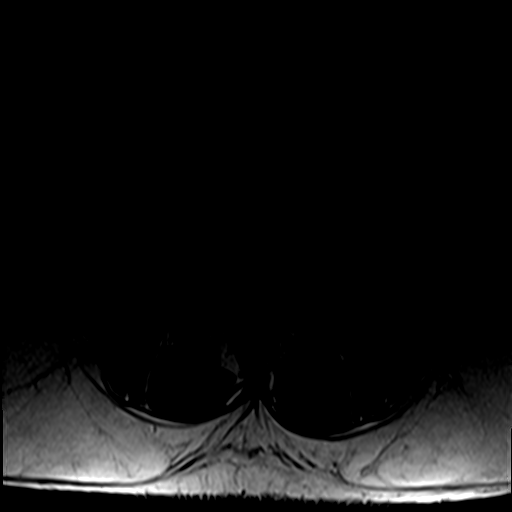
[im 17/37]
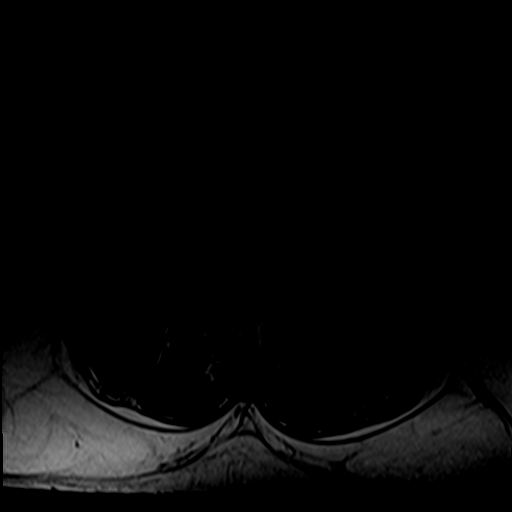
[im 20/37]
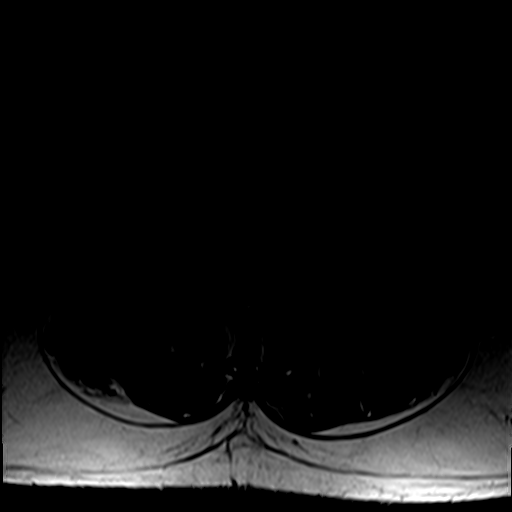
[im 25/37]
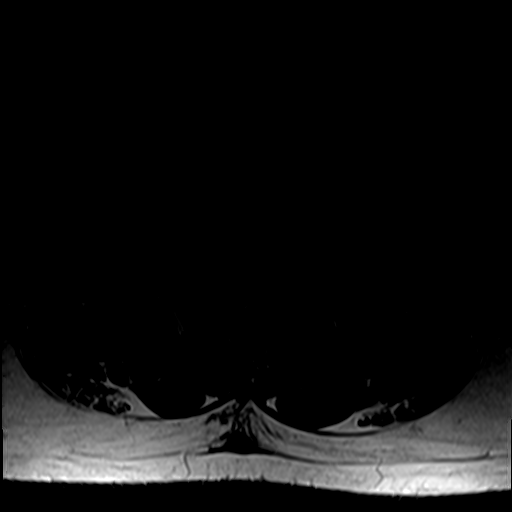
[im 31/37]
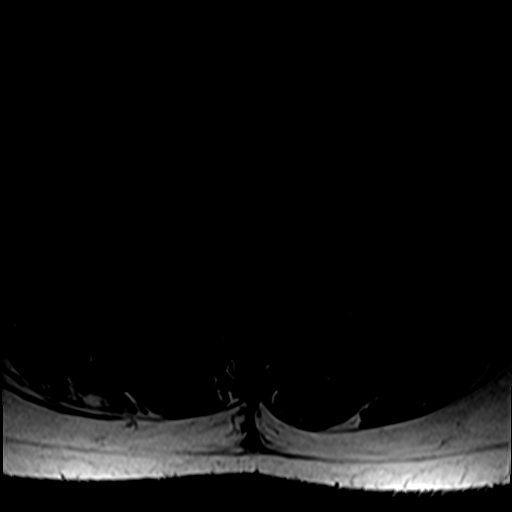
[im 37/37]
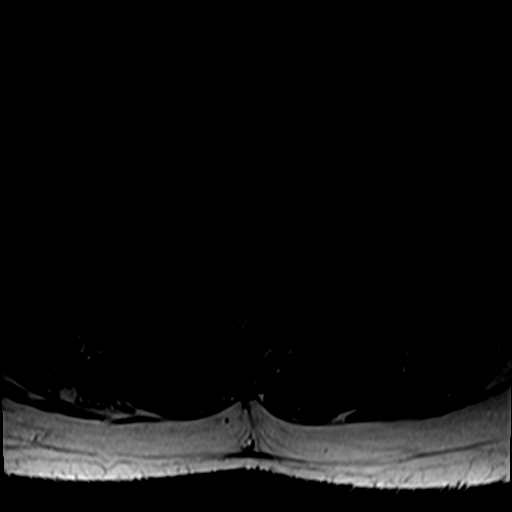

[42 of 48 positions shown; findings below may reference images not displayed]

FINDINGS: Five lumbar type non-rib-bearing vertebral bodies. Normal lumbar
lordosis without listhesis. Small facet effusions, mild facet edema,
and mild edema in surrounding paraspinal muscles associated with the
left-sided L3-4 and L4-5 facet joints, likely degenerative. No
evidence for discitis or fracture. No suspicious bone lesion. Stable
central L3 vertebral body T1/T2 hyperintense focus, probably
hemangioma. Conus extends to L1 level and is normal in appearance.
Normal appearance of the cauda equina.

Disc levels:

L1-2 no significant disc displacement, foraminal stenosis, or canal
stenosis.

L2-3 small disc bulge eccentric to the left. Mild left foraminal
stenosis. No canal stenosis.

L3-4 small disc bulge eccentric to the left. Mild left foraminal and
lateral recess stenosis. No right foraminal stenosis. Mild canal
stenosis.

L4-5 disc bulge and endplate marginal osteophytes eccentric to the
right with mild right-sided facet and ligamentum flavum hypertrophy.
Moderate right and mild left foraminal stenosis. Mild bilateral
recess stenosis. Mild canal stenosis.

L5-S1 small central disc protrusion. No significant foraminal or
canal stenosis.
IMPRESSION: 1. Mild edema associated with the left L3-4 and L4-5 facet joints,
likely degenerative. This may represent the source of pain.
2. Stable lumbar spondylosis greatest at the L3-4 and L4-5 levels.
3. L3-4 mild left foraminal and mild canal stenosis.
4. L4-5 moderate right and mild left foraminal stenosis and mild
canal stenosis.

By: Hygo Yukari M.D.

## 2019-03-25 IMAGING — CR DG CHEST 2V
1 series · 2 of 2 positions shown · non-contrast
Comparison: Chest CT 03/24/2017 and earlier.

CLINICAL DATA: 61-year-old male with headache and left side chest
pain onset at 7266 hr today.

EXAM:
CHEST  2 VIEW

[Series 1: dg chest 2 view · 0.14mm/px · 2 of 2 slices shown]
[im 1/2]
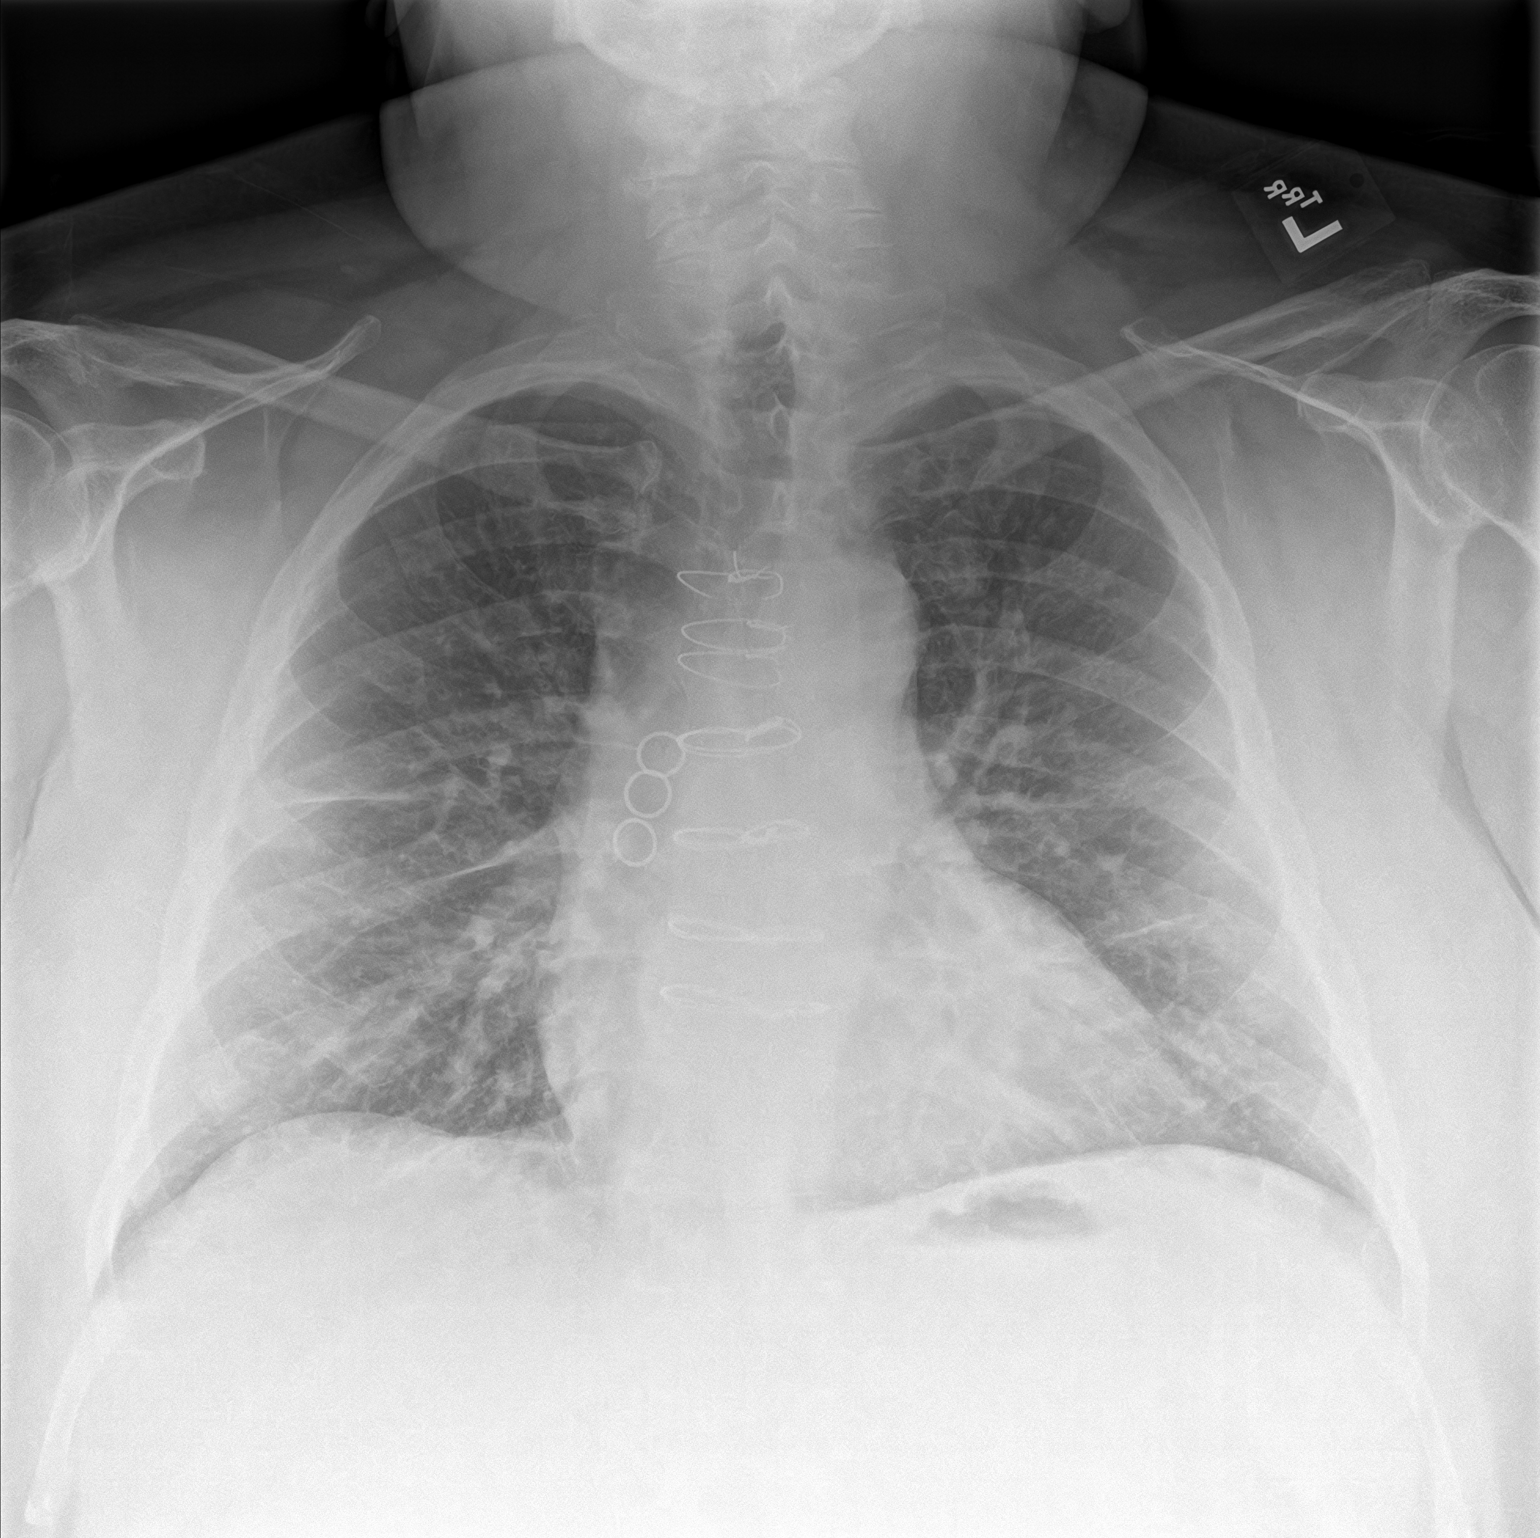
[im 2/2]
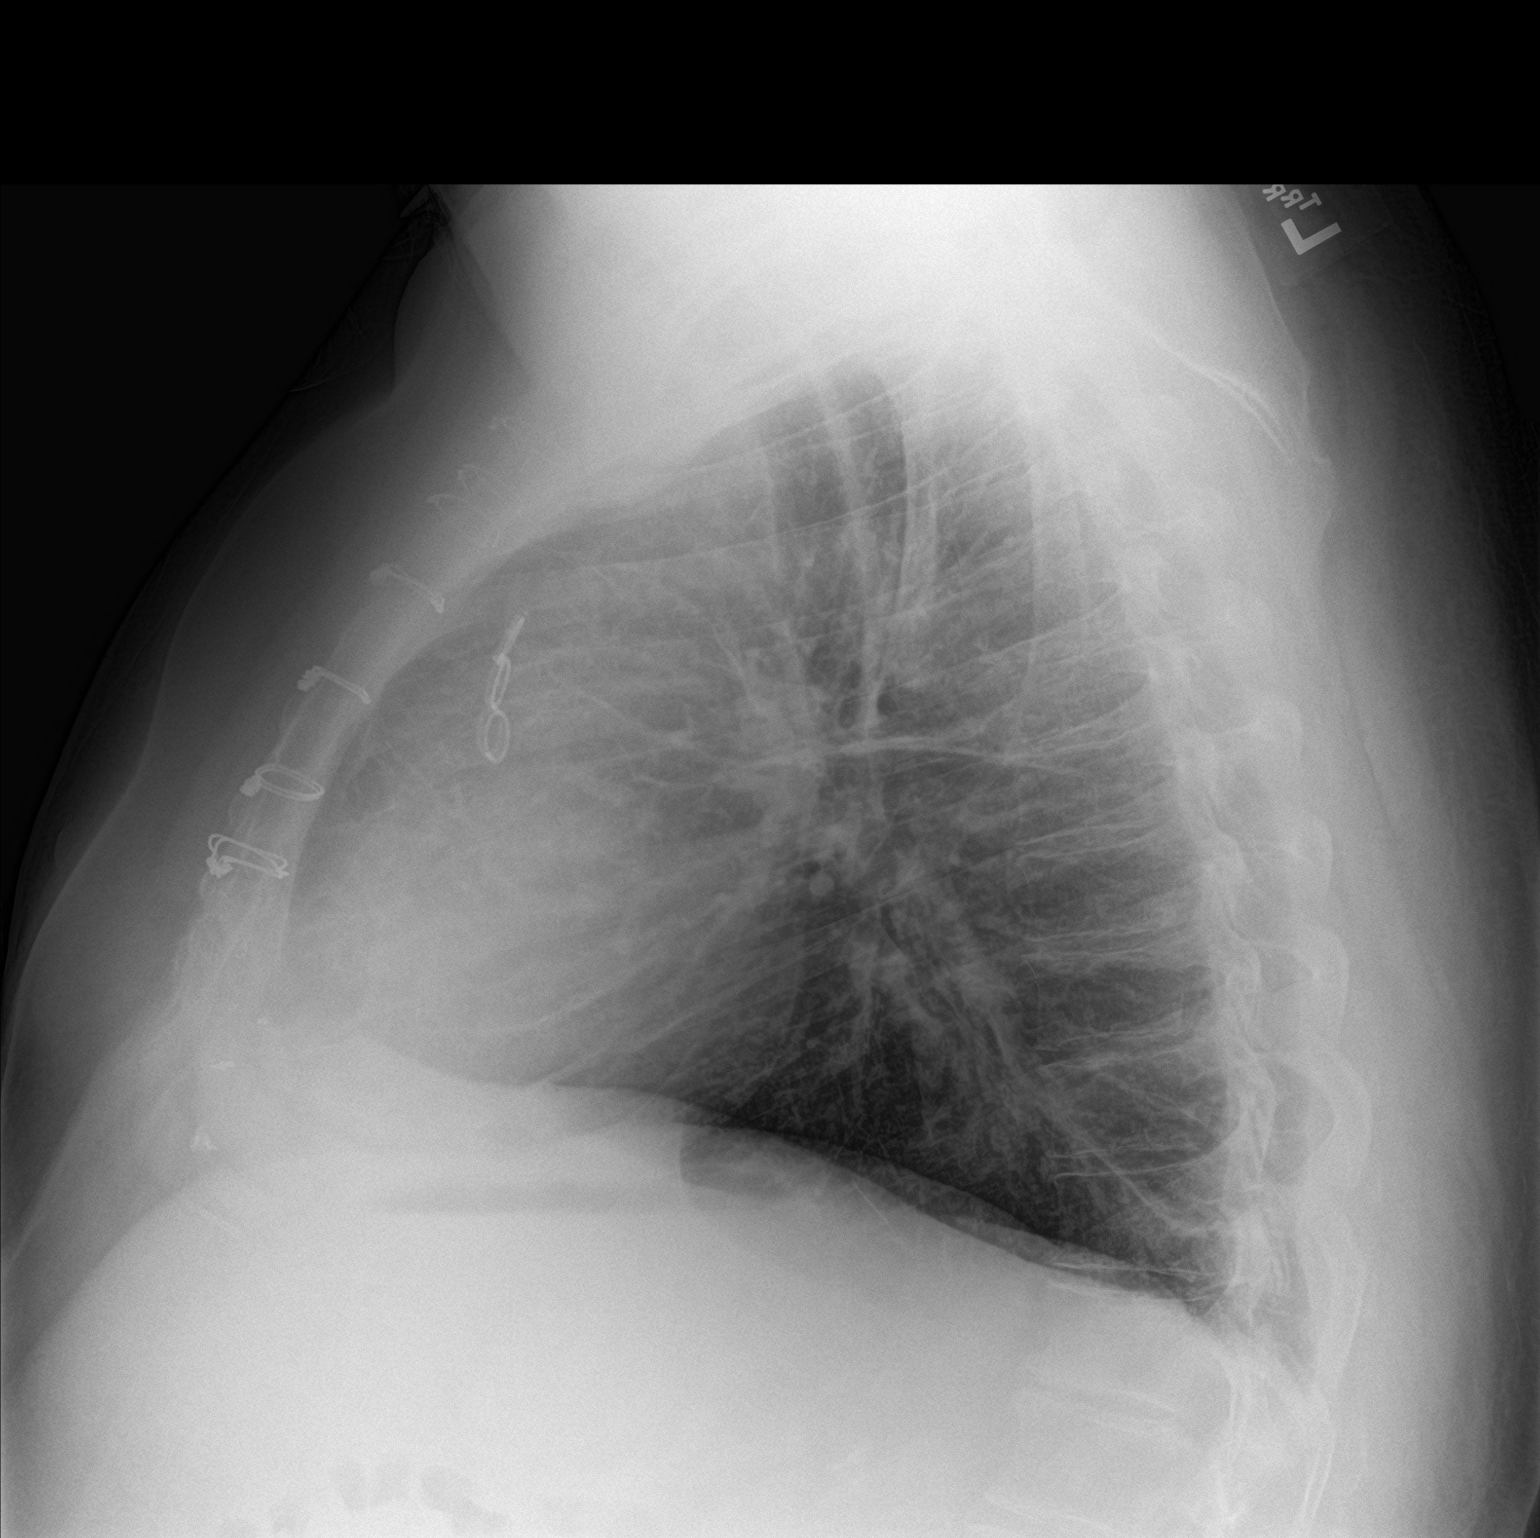

[2 of 2 positions shown; findings below may reference images not displayed]

FINDINGS: Stable lung volumes. Mediastinal contours remain normal. Prior CABG.
Pulmonary vascular congestion appears stable to 03/24/2017
(increased from 07/19/2016) with no pleural fluid identified.
Otherwise chronic increased pulmonary interstitial markings and
bilateral perihilar curvilinear scarring are unchanged. No
pneumothorax. No acute osseous abnormality identified. Negative
visible bowel gas pattern.
IMPRESSION: 1. Pulmonary vascular congestion without overt edema similar to
[DATE]. No pleural effusion or other acute cardiopulmonary abnormality.

## 2019-03-28 IMAGING — CR DG CHEST 2V
2 series · 3 of 3 positions shown · non-contrast
Comparison: 07/28/2017

CLINICAL DATA: Shortness of breath

EXAM:
CHEST  2 VIEW

[chest pa]
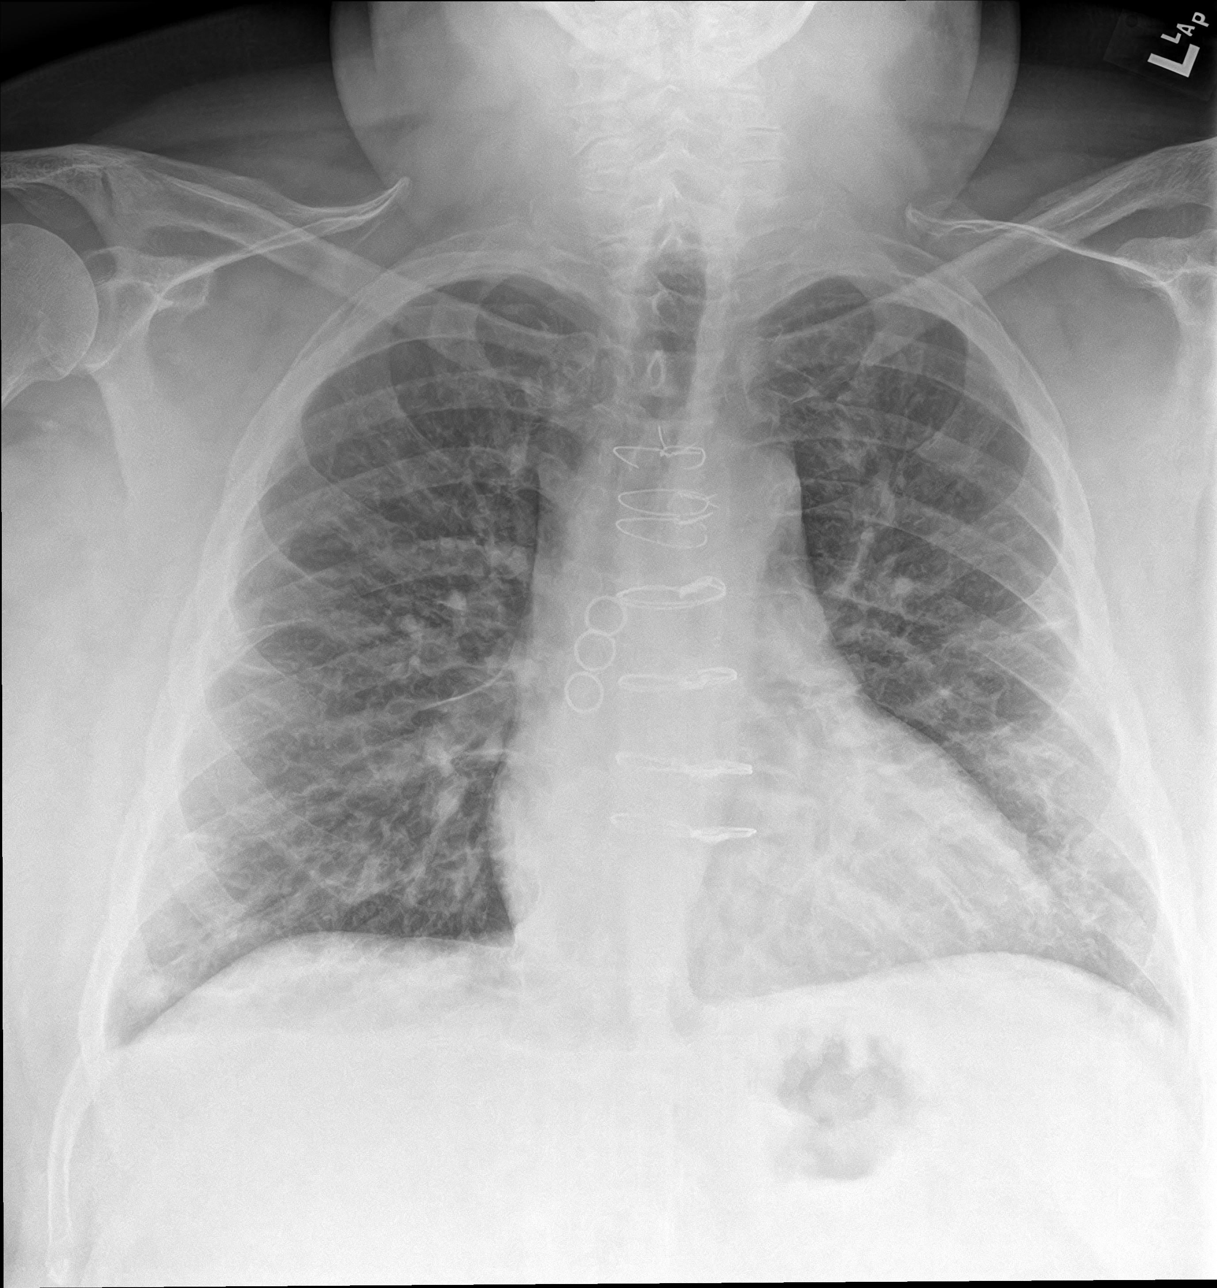

[Series 2: chest lat · 0.14mm/px · 2 of 2 slices shown]
[im 1/2]
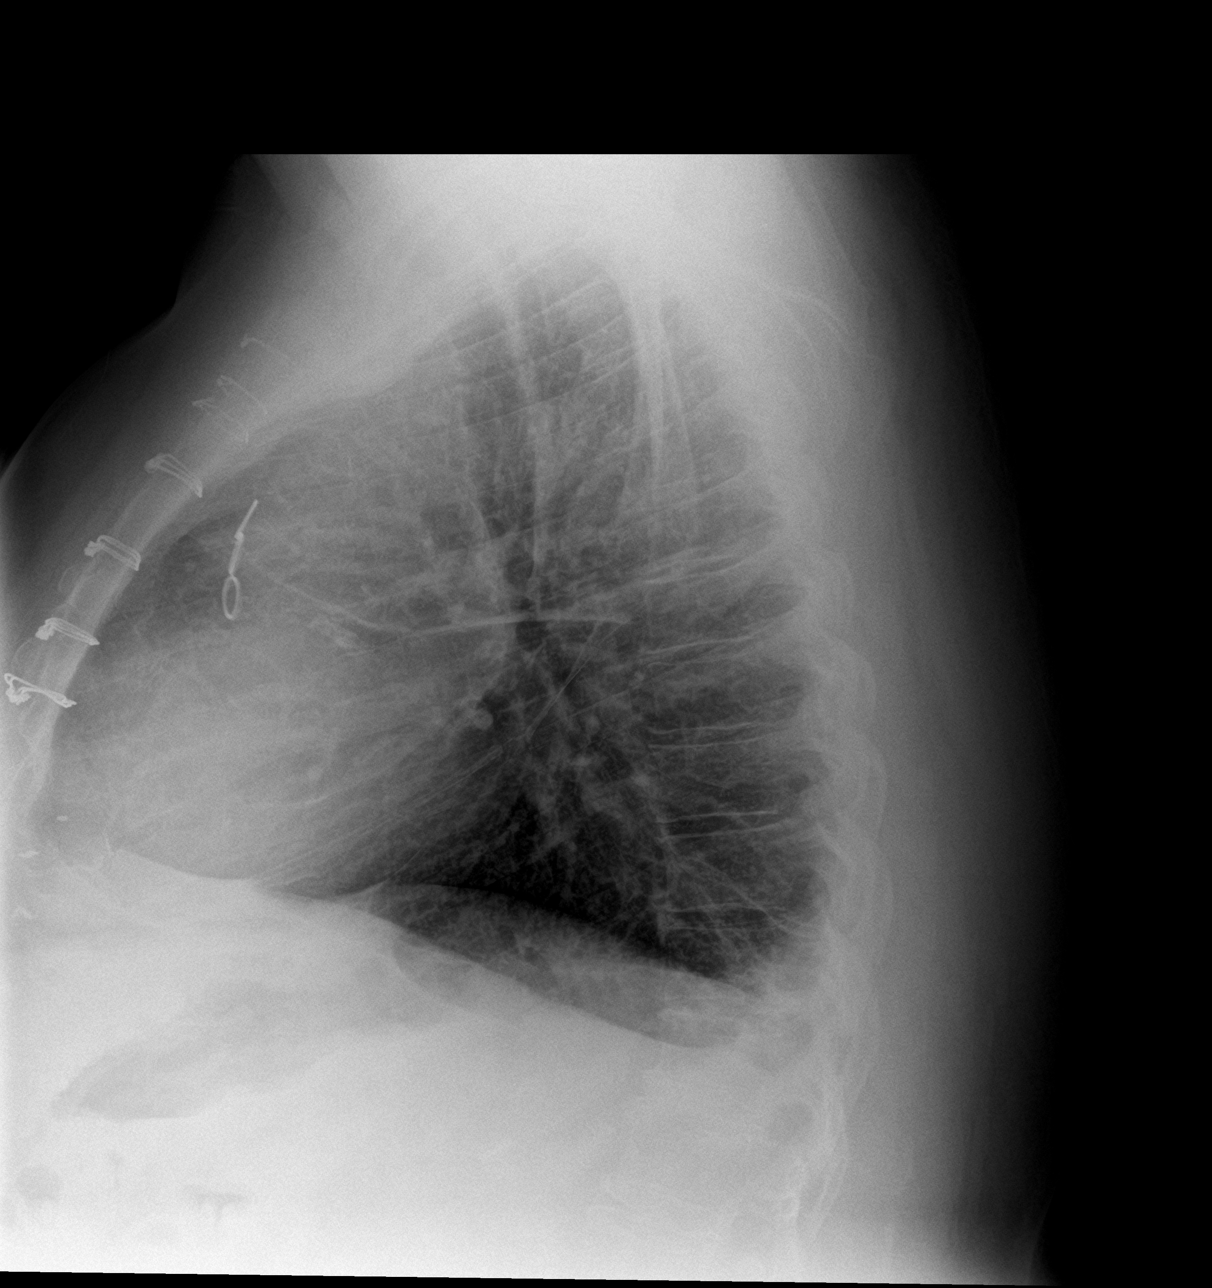
[im 2/2]
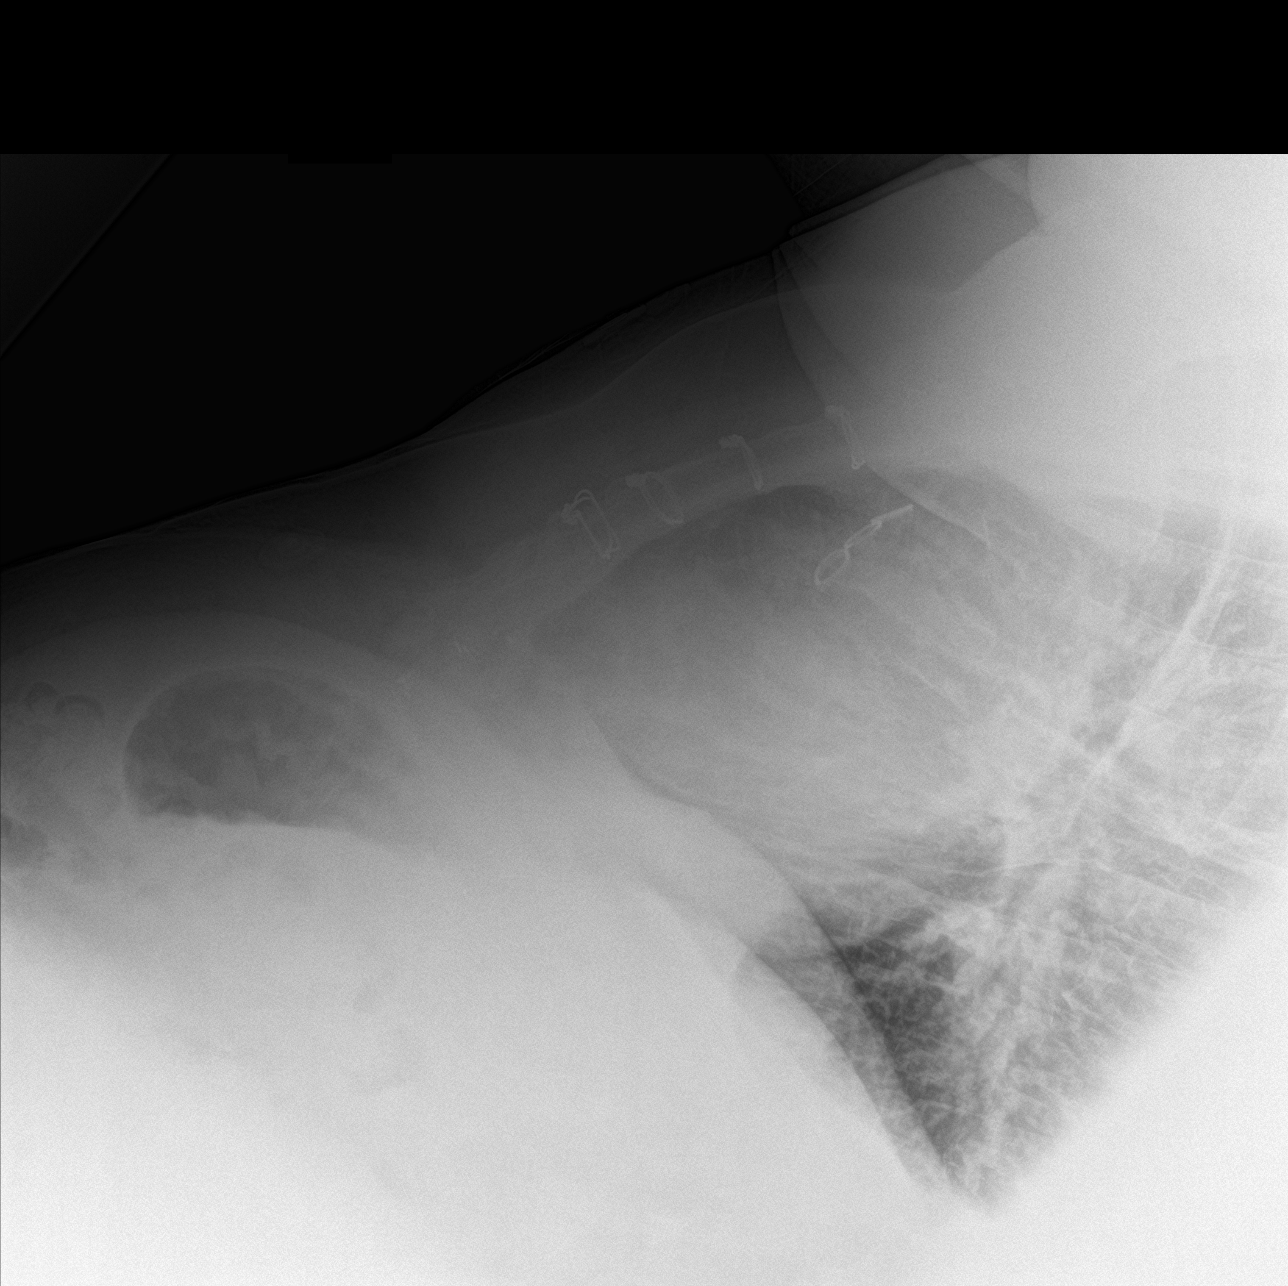

[3 of 3 positions shown; findings below may reference images not displayed]

FINDINGS: The cardiopericardial silhouette is within normal limits for size.
The patient is status post CABG. There is pulmonary vascular
congestion without overt pulmonary edema. Atelectasis bilaterally is
similar to prior. The visualized bony structures of the thorax are
intact.
IMPRESSION: Similar to prior study with vascular congestion but no overt
pulmonary edema.

Atelectasis bilaterally.

## 2019-04-07 IMAGING — CR DG CHEST 2V
2 series · 2 of 2 positions shown · non-contrast
Comparison: 07/31/2017

CLINICAL DATA: Body aches, weakness, cough

EXAM:
CHEST  2 VIEW

[chest pa]
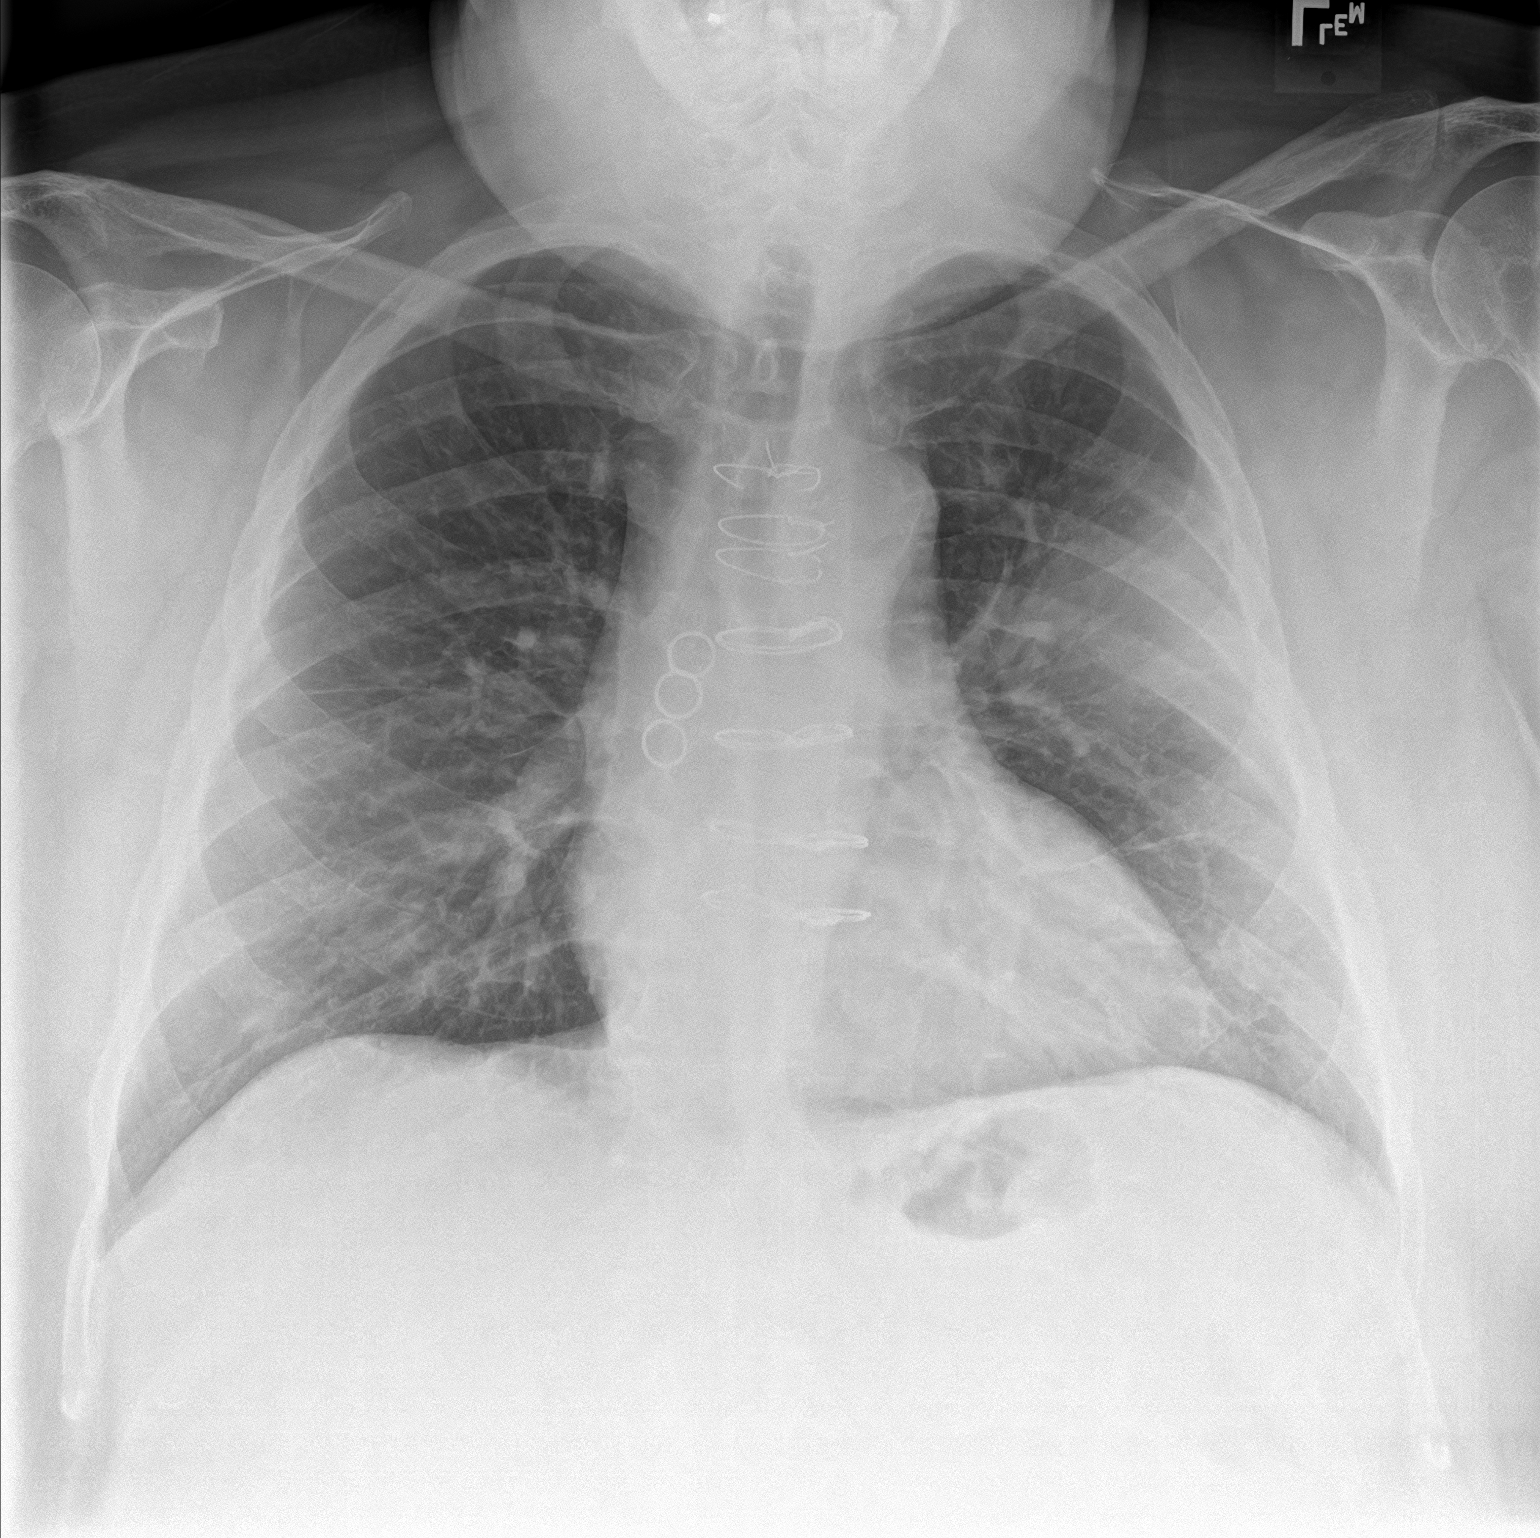

[chest lat]
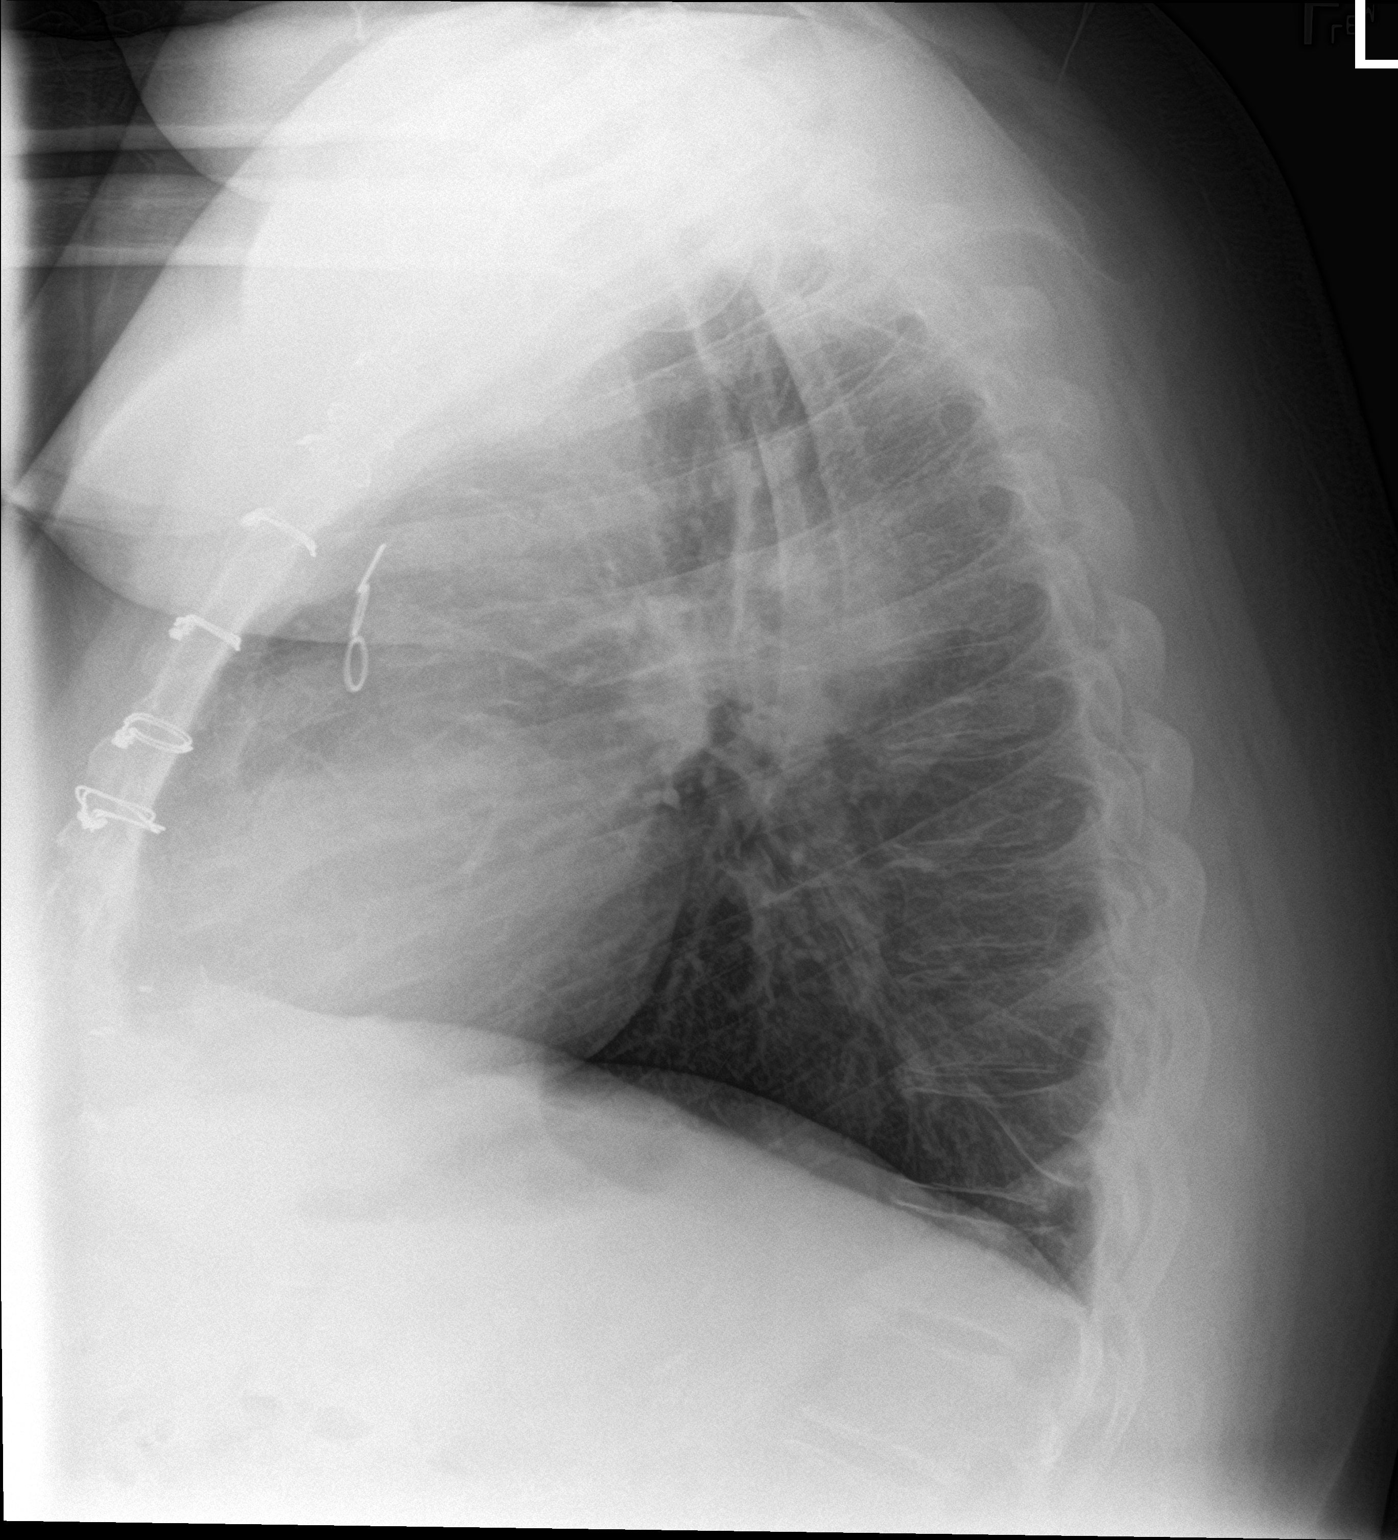

[2 of 2 positions shown; findings below may reference images not displayed]

FINDINGS: There is airspace disease in the superior segment of the left lower
lobe. There is no other focal parenchymal opacity. There is no
pleural effusion or pneumothorax. The heart and mediastinal contours
are unremarkable. There is evidence of prior CABG.

The osseous structures are unremarkable.
IMPRESSION: Left lower lobe pneumonia. Followup PA and lateral chest X-ray is
recommended in 3-4 weeks following trial of antibiotic therapy to
ensure resolution and exclude underlying malignancy.

## 2019-06-18 IMAGING — CR DG CHEST 2V
2 series · 2 of 2 positions shown · non-contrast
Comparison: Chest radiograph dated 08/10/2017

CLINICAL DATA: 62-year-old male with chest pain.

EXAM:
CHEST - 2 VIEW

[chest lat]
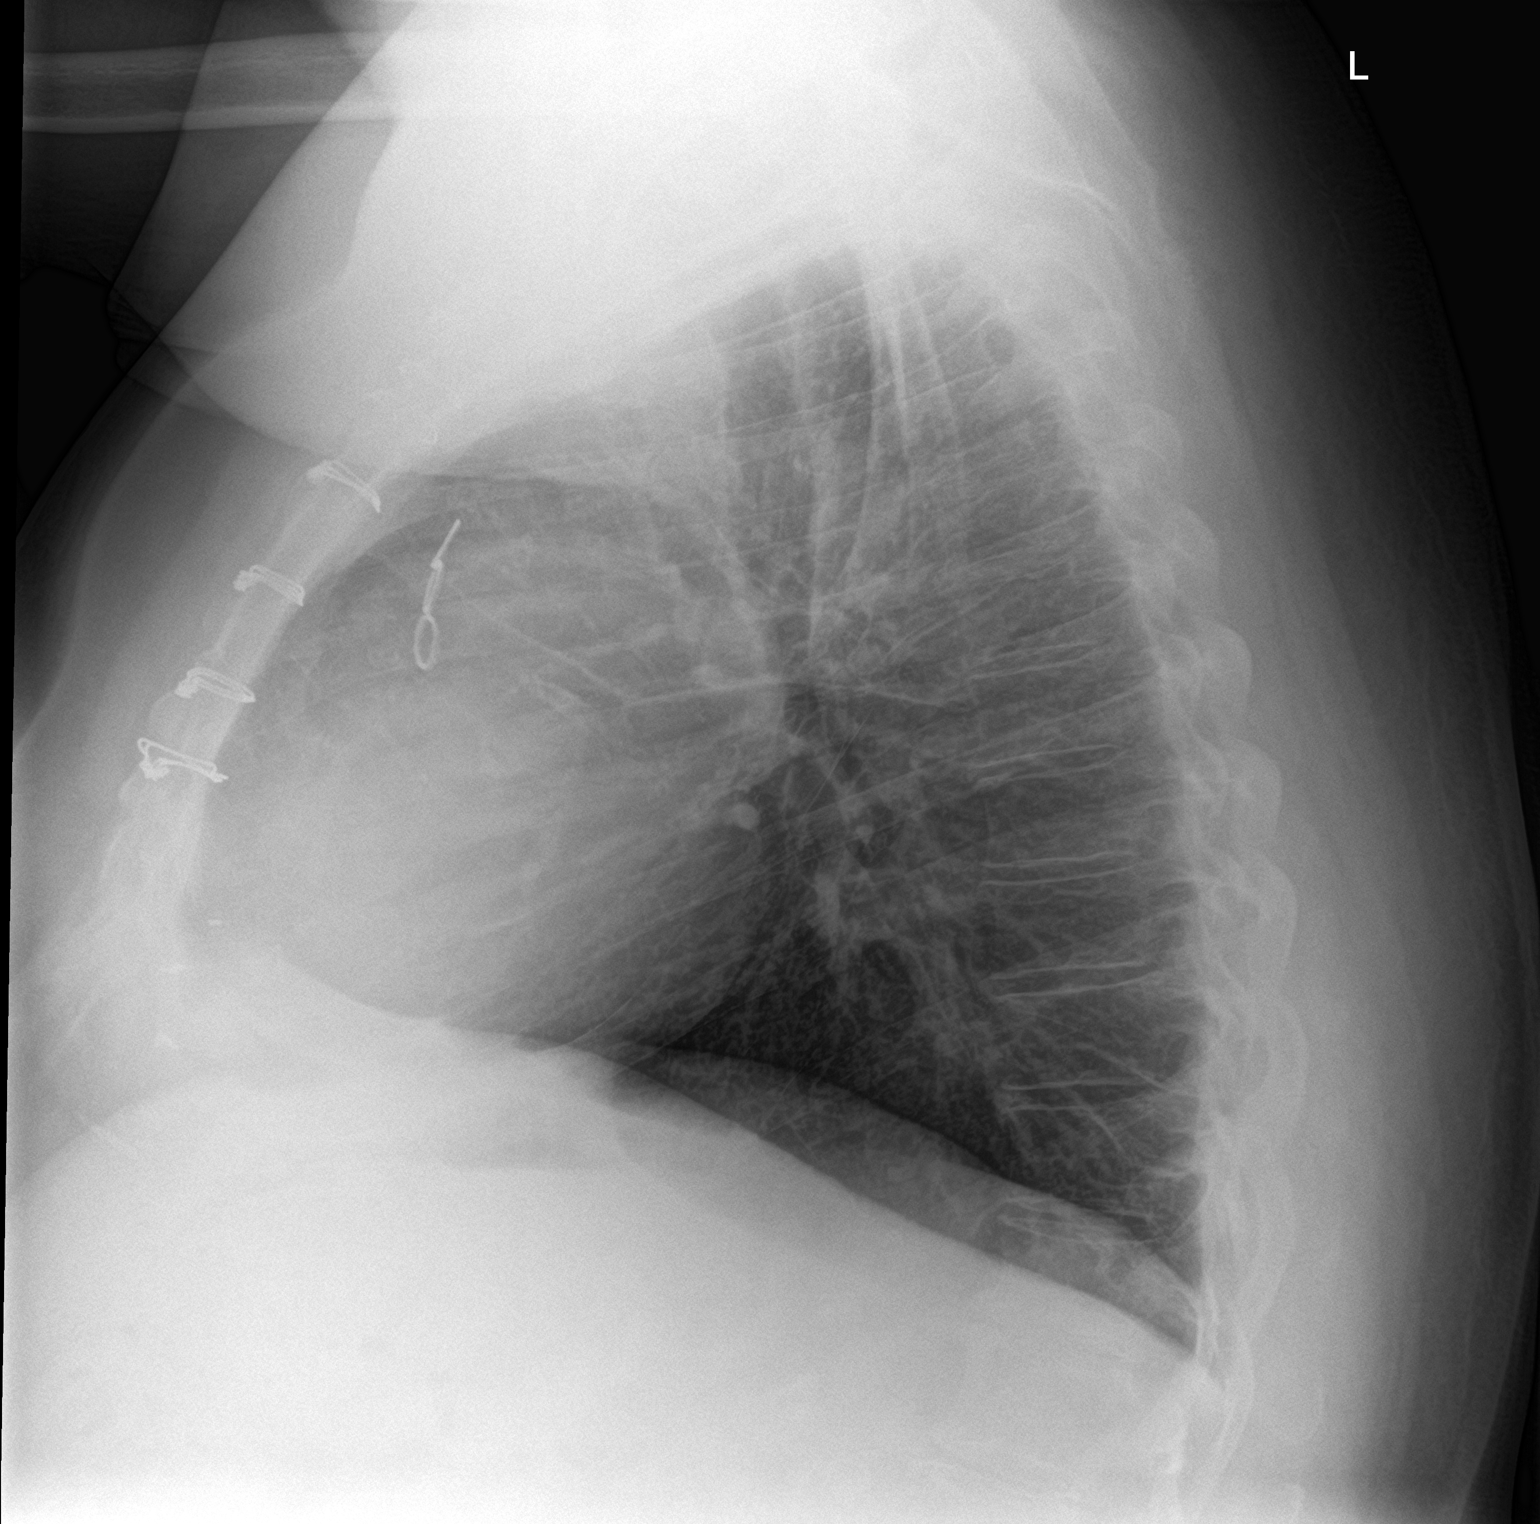

[chest pa]
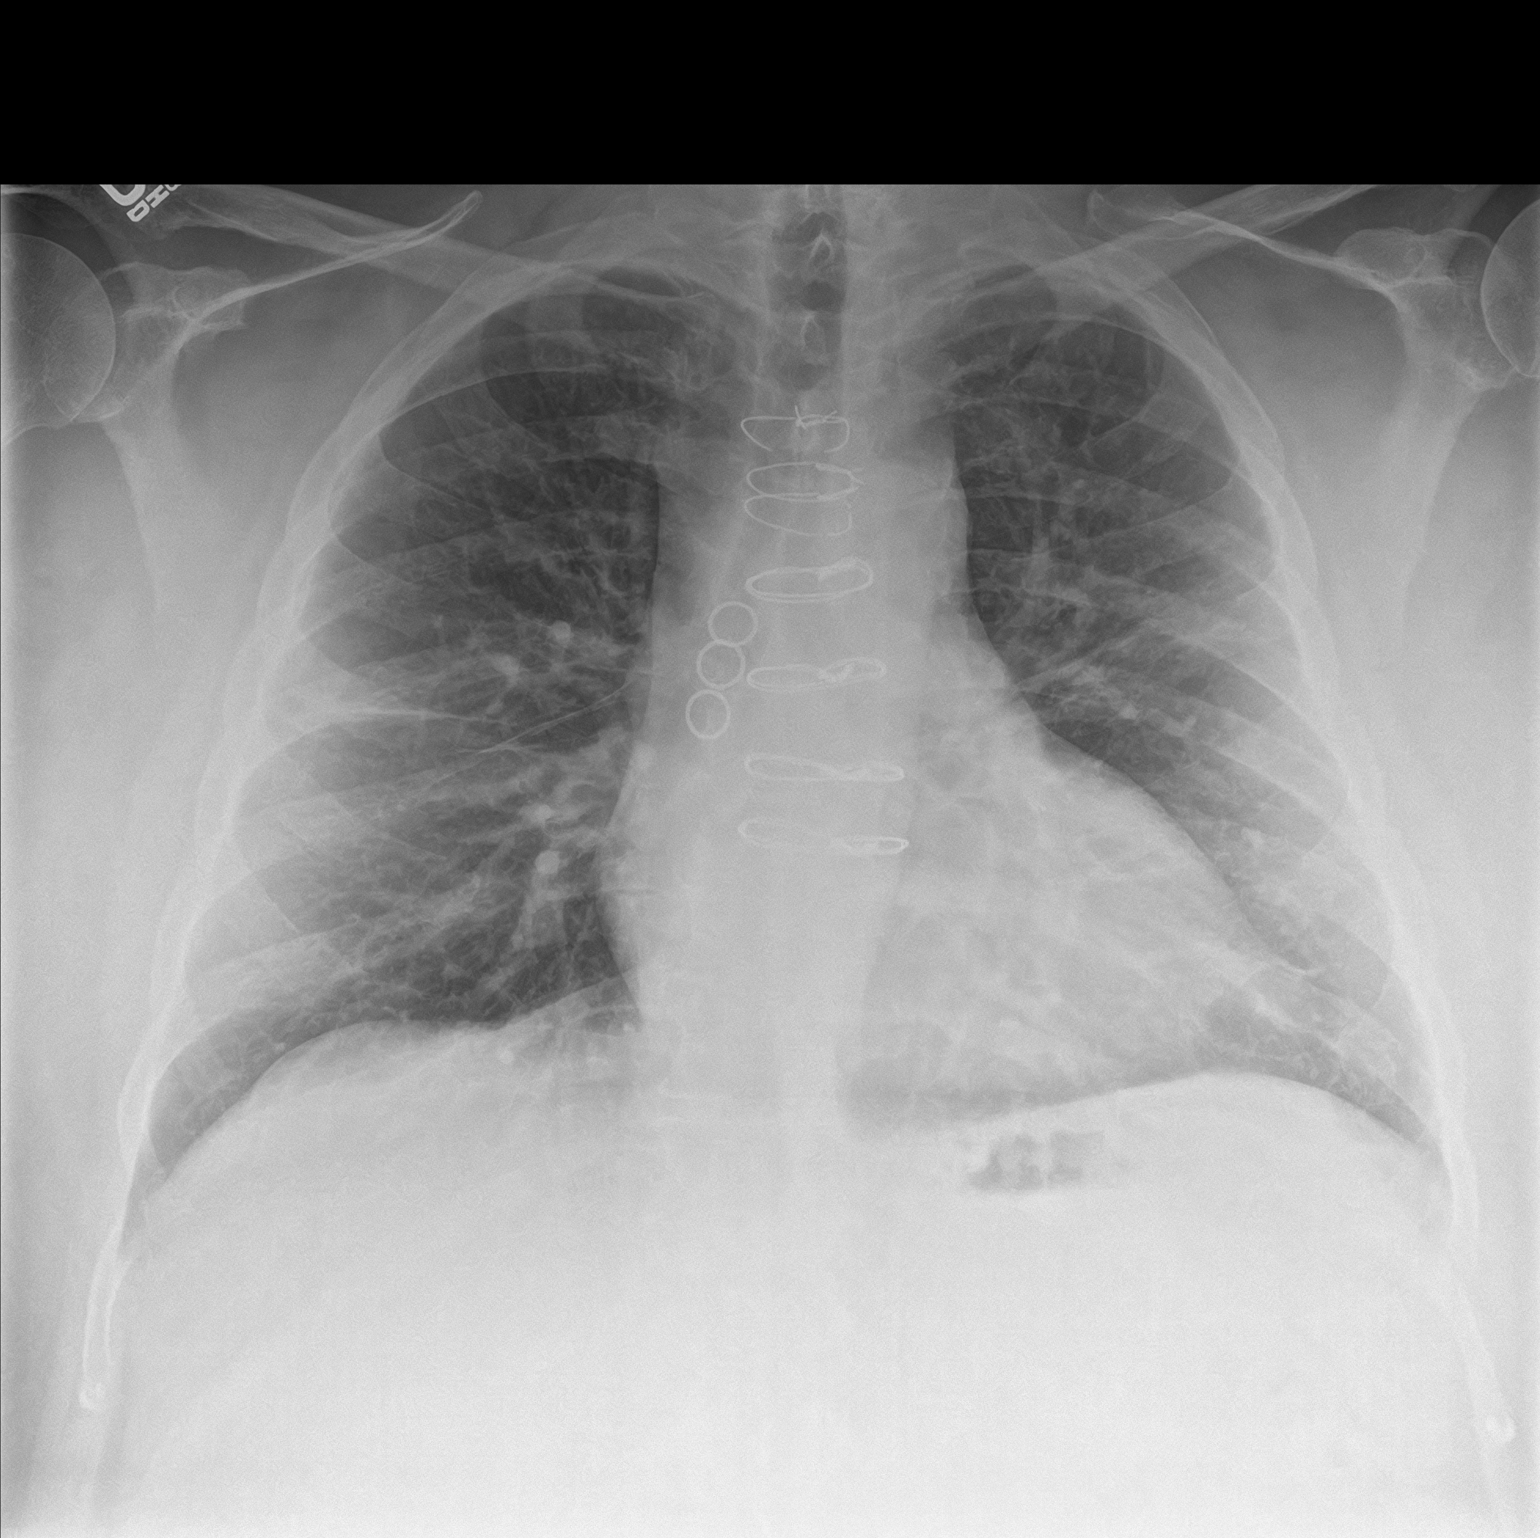

[2 of 2 positions shown; findings below may reference images not displayed]

FINDINGS: There has been interval resolution of the previously seen left sided
pneumonia. There is no focal consolidation, pleural effusion, or
pneumothorax. The cardiac silhouette is within normal limits. No
acute osseous pathology. Median sternotomy wires and CABG vascular
clips.
IMPRESSION: No active cardiopulmonary disease.

## 2019-06-25 IMAGING — DX DG CHEST 1V PORT
1 series · 1 of 1 positions shown · non-contrast
Comparison: Chest radiograph July 24, 2017

CLINICAL DATA: Central and LEFT chest pain beginning this
afternoon. History of myocardial infarction and recent cardiac stent
placement.

EXAM:
PORTABLE CHEST 1 VIEW

[chest ap]
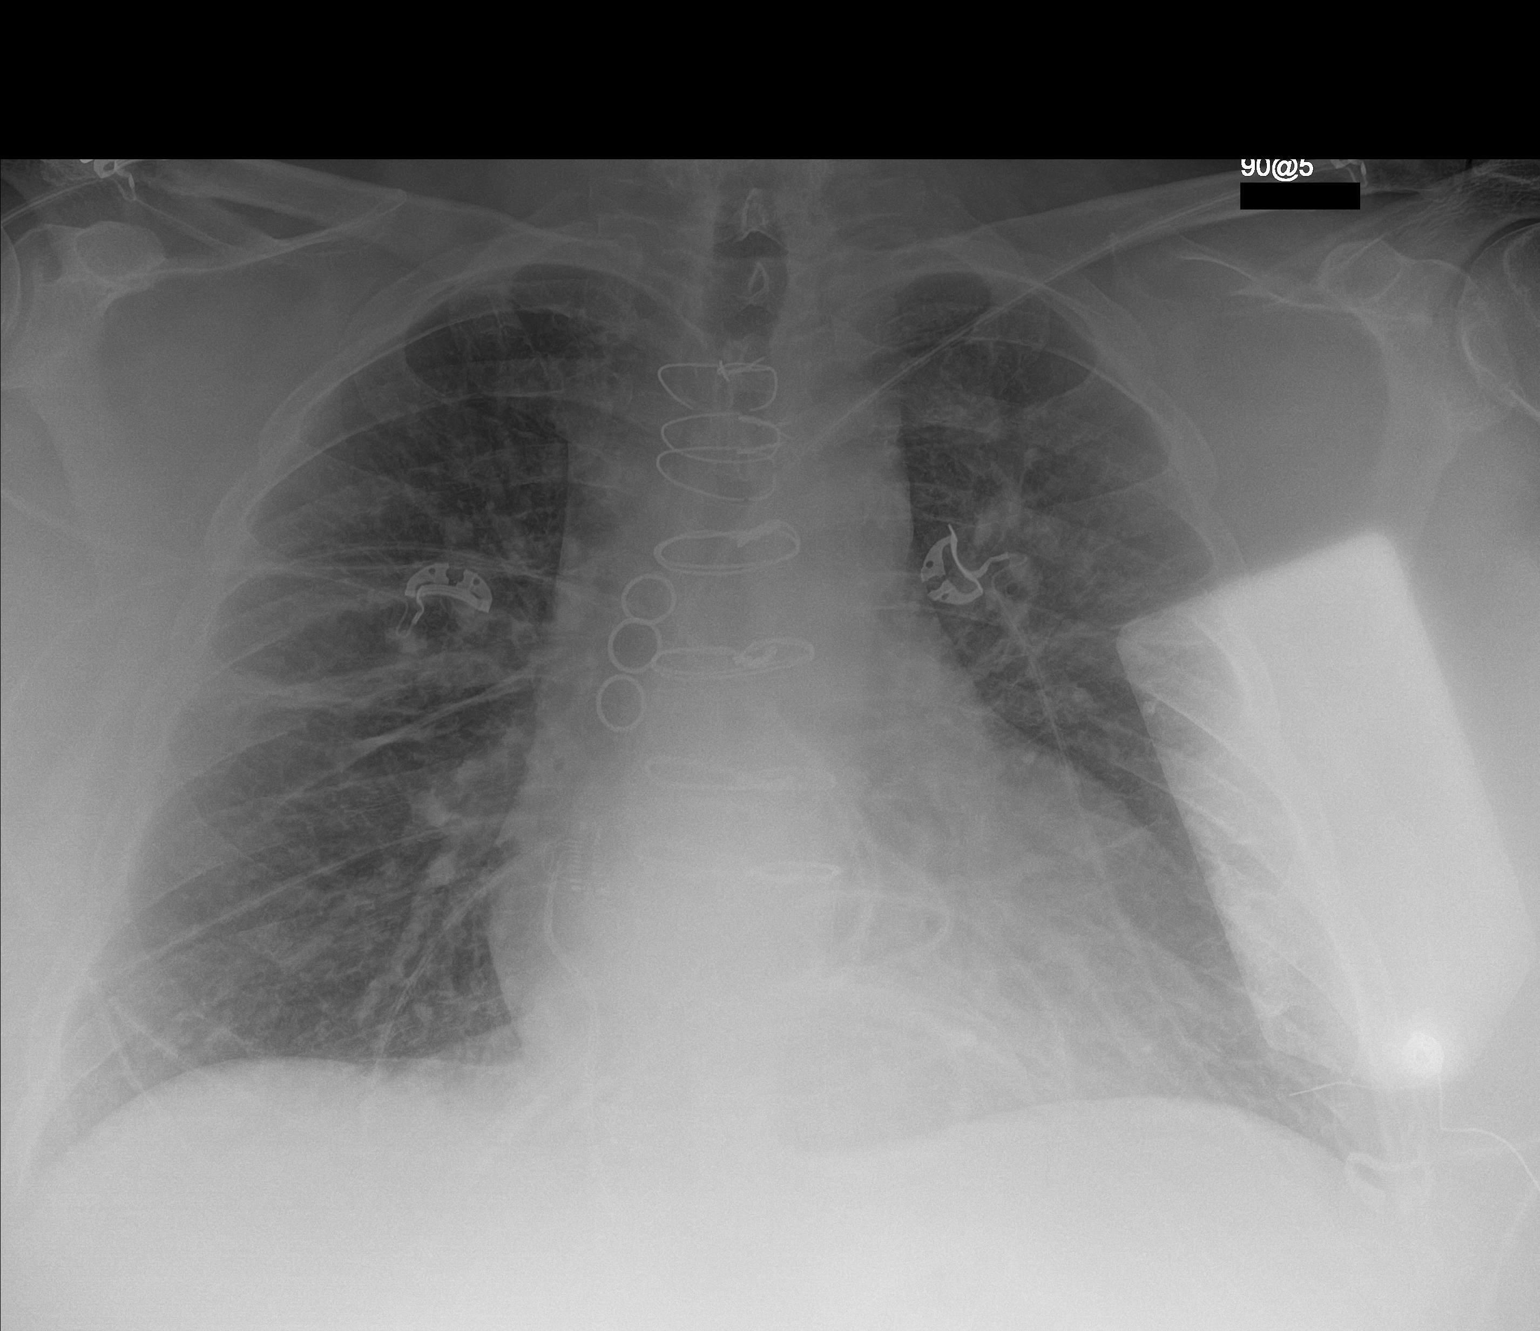

[1 of 1 positions shown; findings below may reference images not displayed]

FINDINGS: Cardiac silhouette is mildly enlarged and unchanged. Status post
median sternotomy for CABG, chronically fractured proximal wires.
Mild bronchitic changes without pleural effusion or focal
consolidation. No pneumothorax. Soft tissue planes included osseous
structures are nonsuspicious.
IMPRESSION: Stable cardiomegaly.  Mild bronchitic changes.

## 2019-06-27 IMAGING — DX DG CHEST 1V PORT
1 series · 1 of 1 positions shown · non-contrast
Comparison: 10/28/2017 and earlier.

CLINICAL DATA: 62-year-old male with shortness of breath. Recent
NSTEMI.

EXAM:
PORTABLE CHEST 1 VIEW

[chest ap]
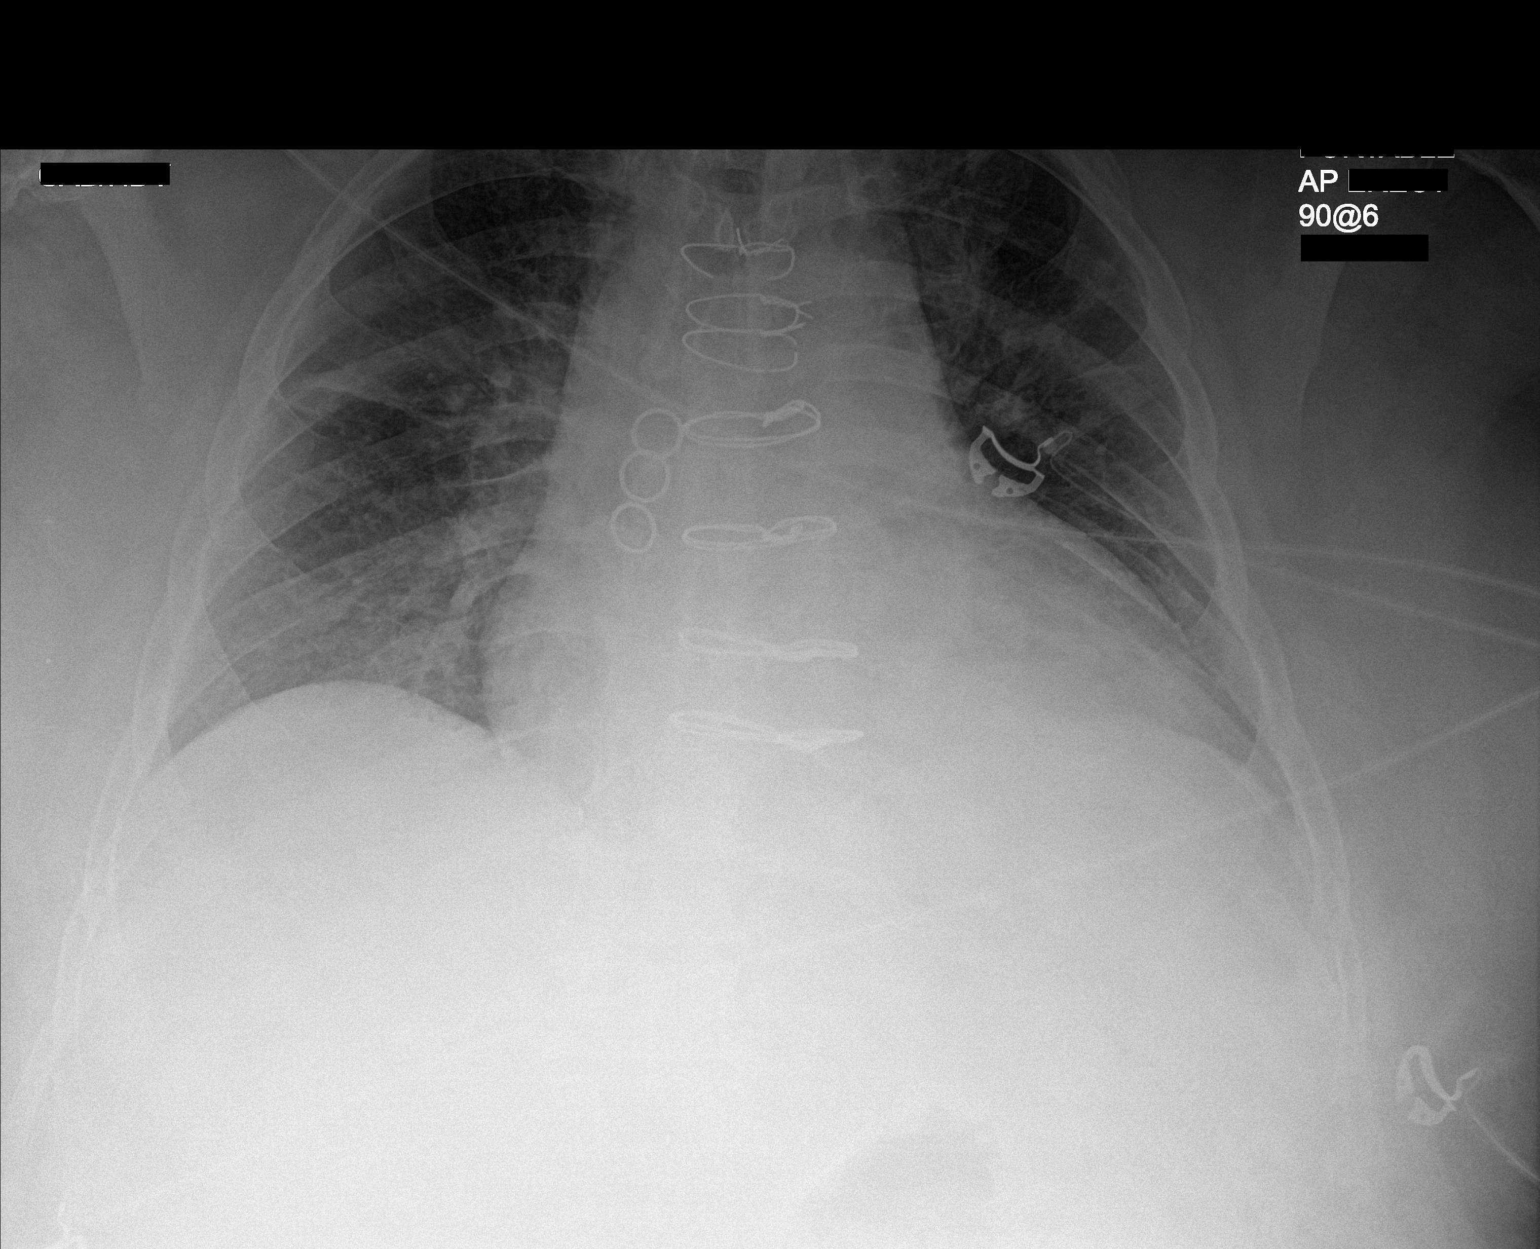

[1 of 1 positions shown; findings below may reference images not displayed]

FINDINGS: Portable AP upright view at 6565 hours. Lower lung volumes. Stable
cardiomegaly and mediastinal contours. Prior CABG. No pneumothorax,
pulmonary edema or definite pleural effusion. Chronic perihilar
scarring. Mildly increased retrocardiac opacity along the left
hemidiaphragm most resembles atelectasis. Paucity of bowel gas in
the upper abdomen.
IMPRESSION: Lower lung volumes with mild atelectasis. No other acute
cardiopulmonary abnormality.

## 2019-07-27 IMAGING — CR DG CHEST 2V
1 series · 2 of 2 positions shown · non-contrast
Comparison: 10/30/2017

CLINICAL DATA: Non smoker, increasing SOB this morning and feels
like he's choking. Hx of multiple heart issues including recent
stent placement, CHF and surgery at [place] 1921

EXAM:
CHEST - 2 VIEW

[Series 1: dg chest 2 view · 0.14mm/px · 2 of 2 slices shown]
[im 1/2]
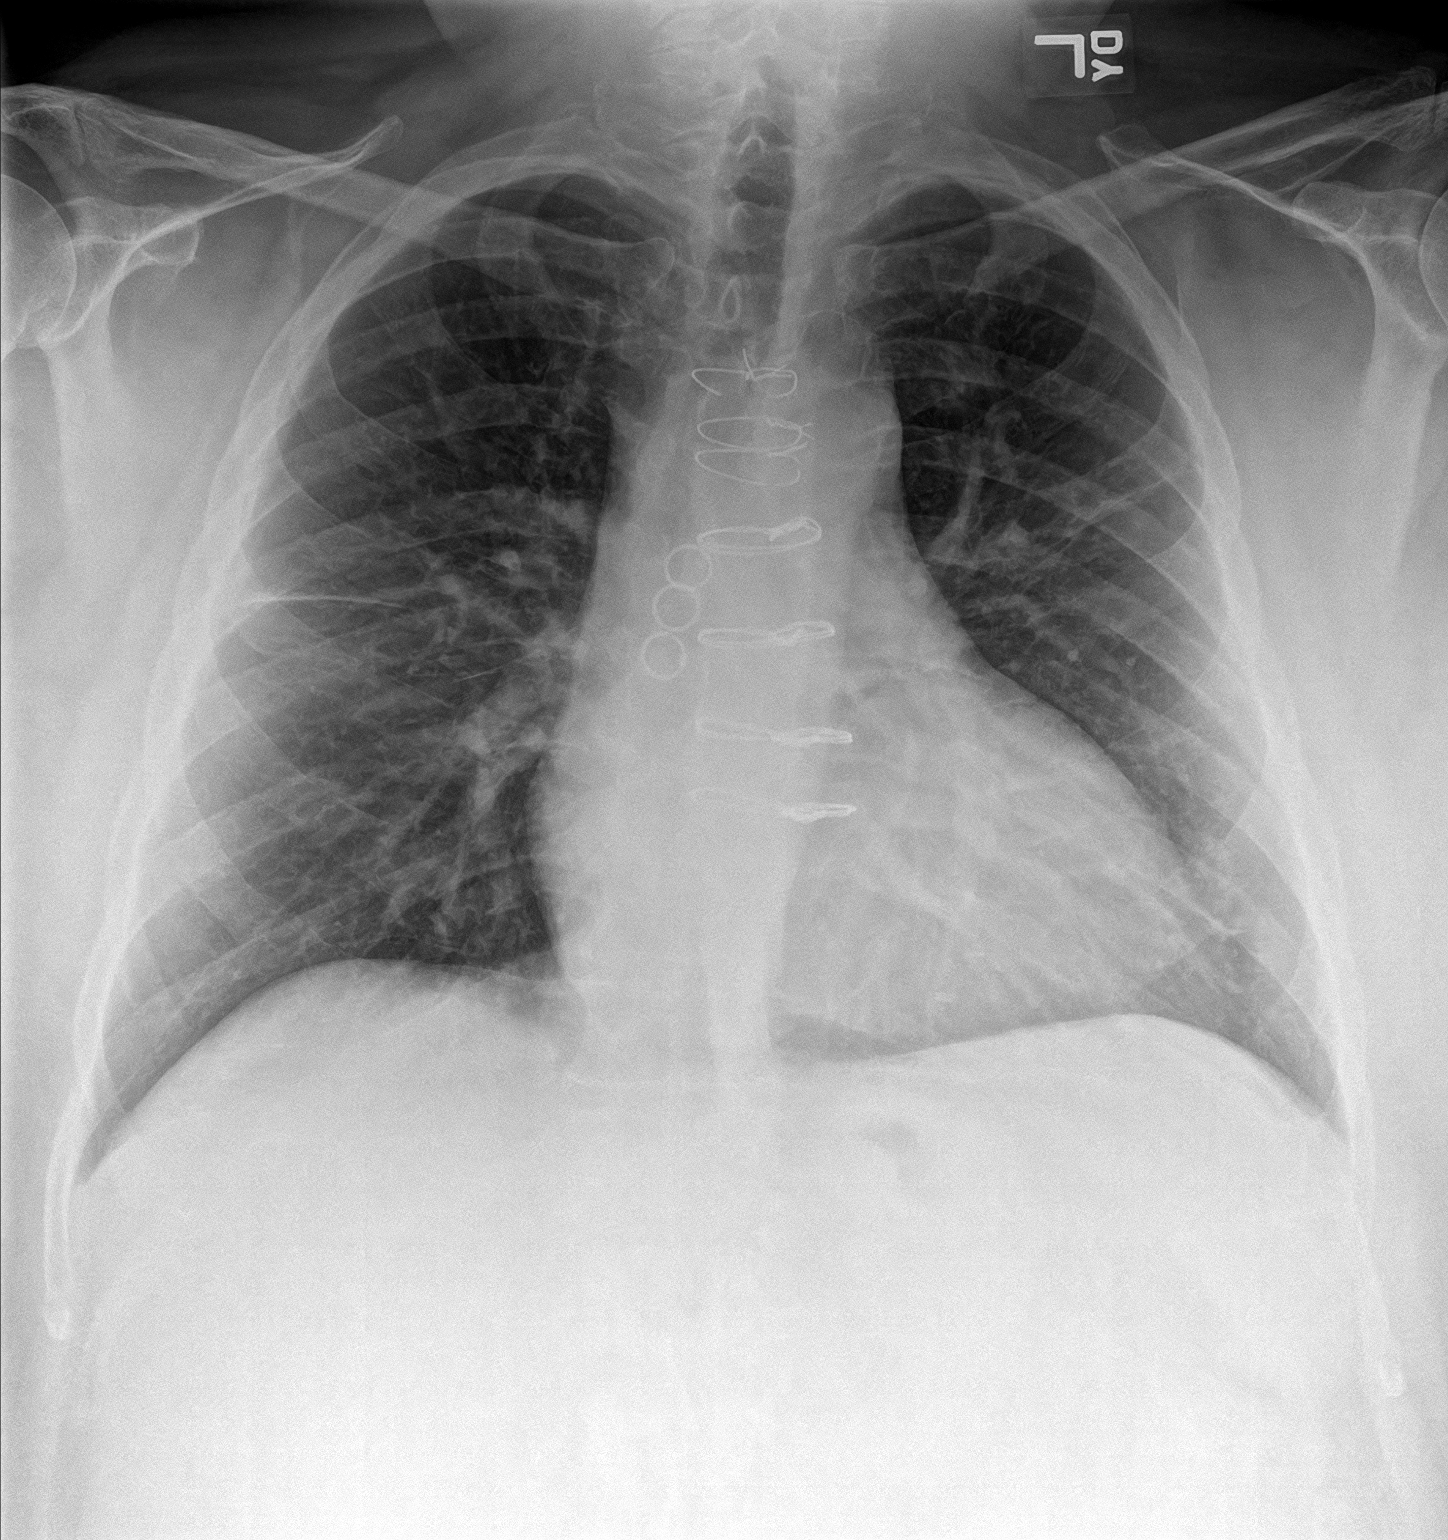
[im 2/2]
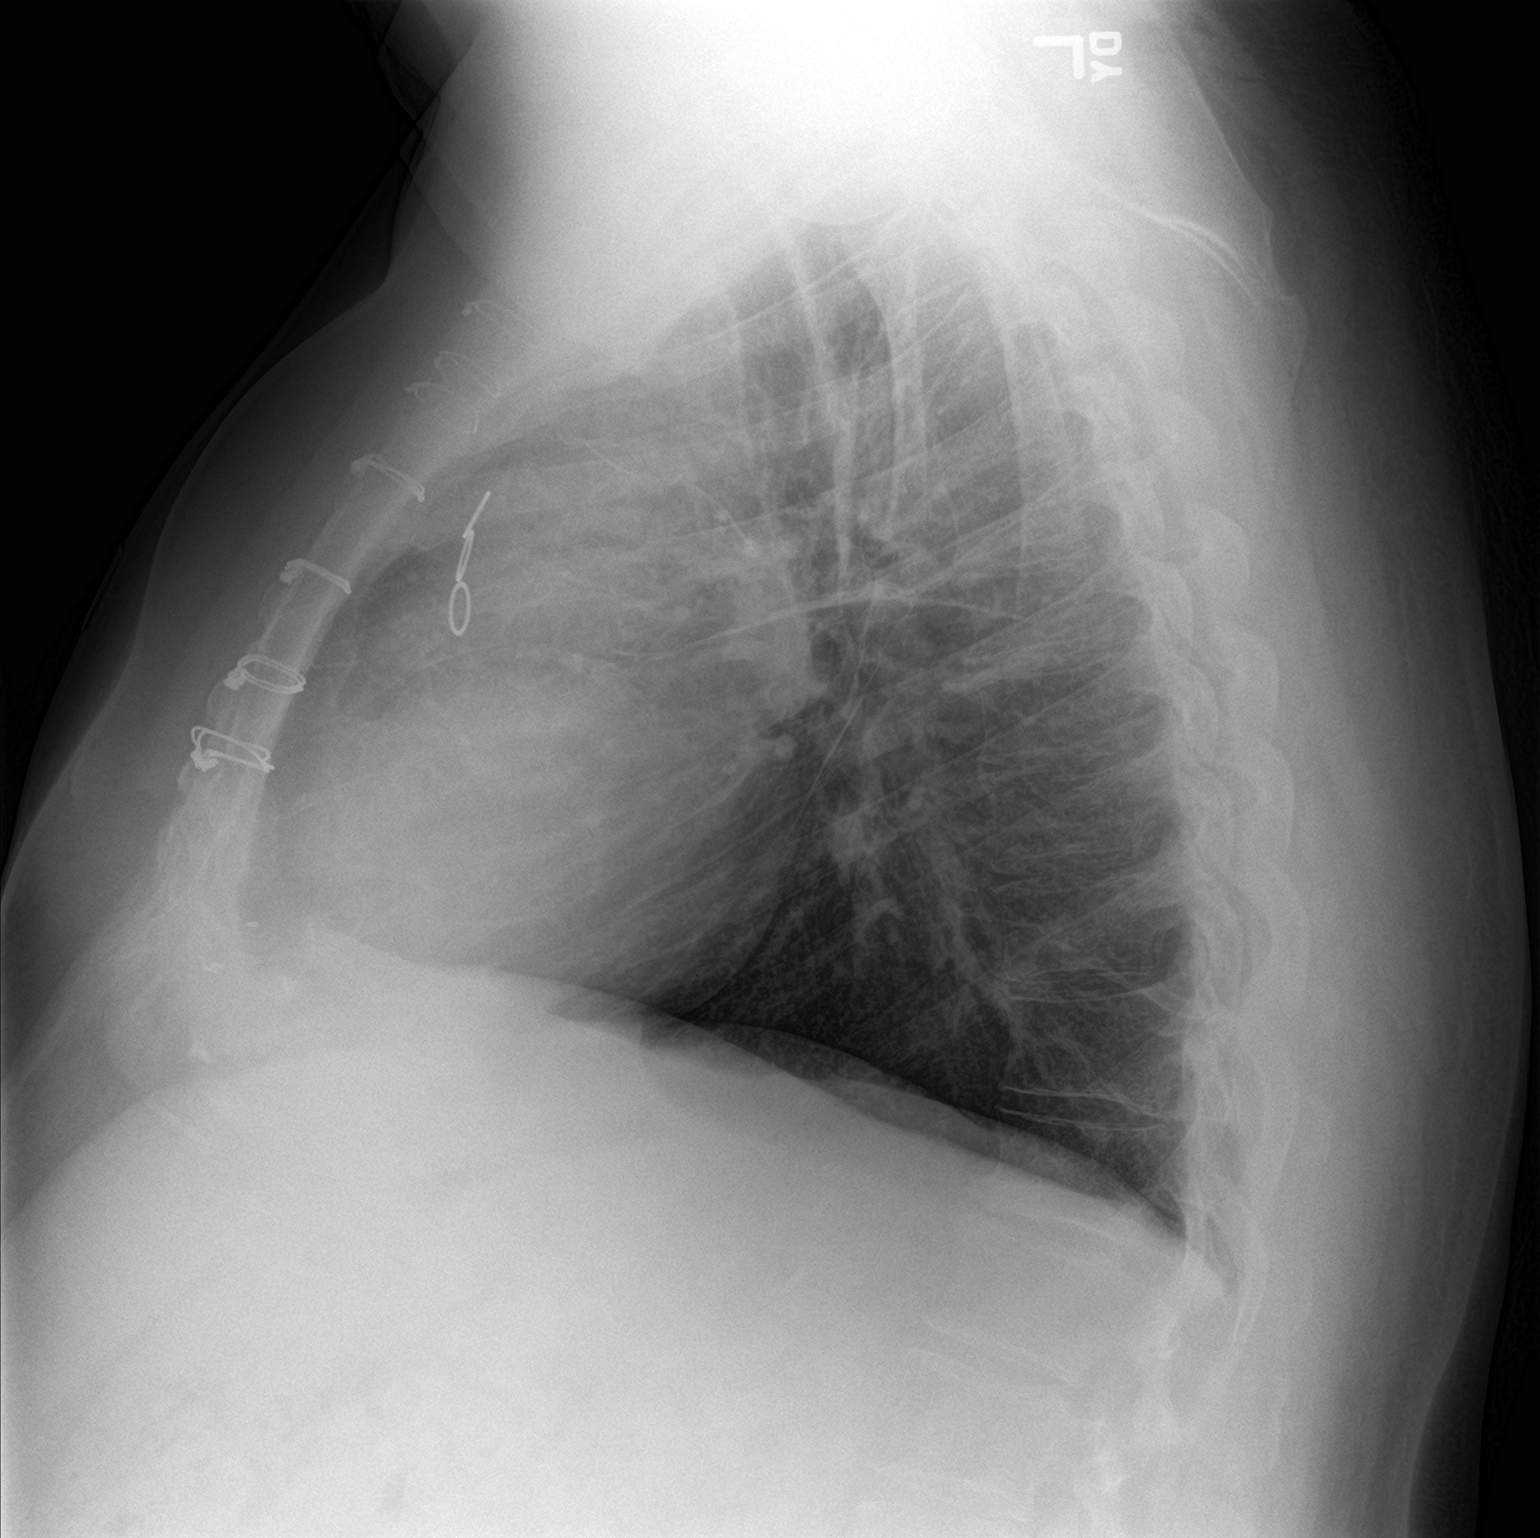

[2 of 2 positions shown; findings below may reference images not displayed]

FINDINGS: Hyperinflation. Prior median sternotomy. Midline trachea. Normal
heart size and mediastinal contours. Atherosclerosis in the
transverse aorta. No pleural effusion or pneumothorax. Mild lower
lobe predominant interstitial thickening. Mild right minor fissure
thickening.
IMPRESSION: No acute cardiopulmonary disease.

Hyperinflation and interstitial thickening, most consistent with
COPD/chronic bronchitis.

## 2019-07-31 IMAGING — DX DG CHEST 1V PORT
1 series · 1 of 1 positions shown · non-contrast
Comparison: 11/29/2017, 10/30/2017

CLINICAL DATA: STEMI

EXAM:
PORTABLE CHEST 1 VIEW

[chest ap]
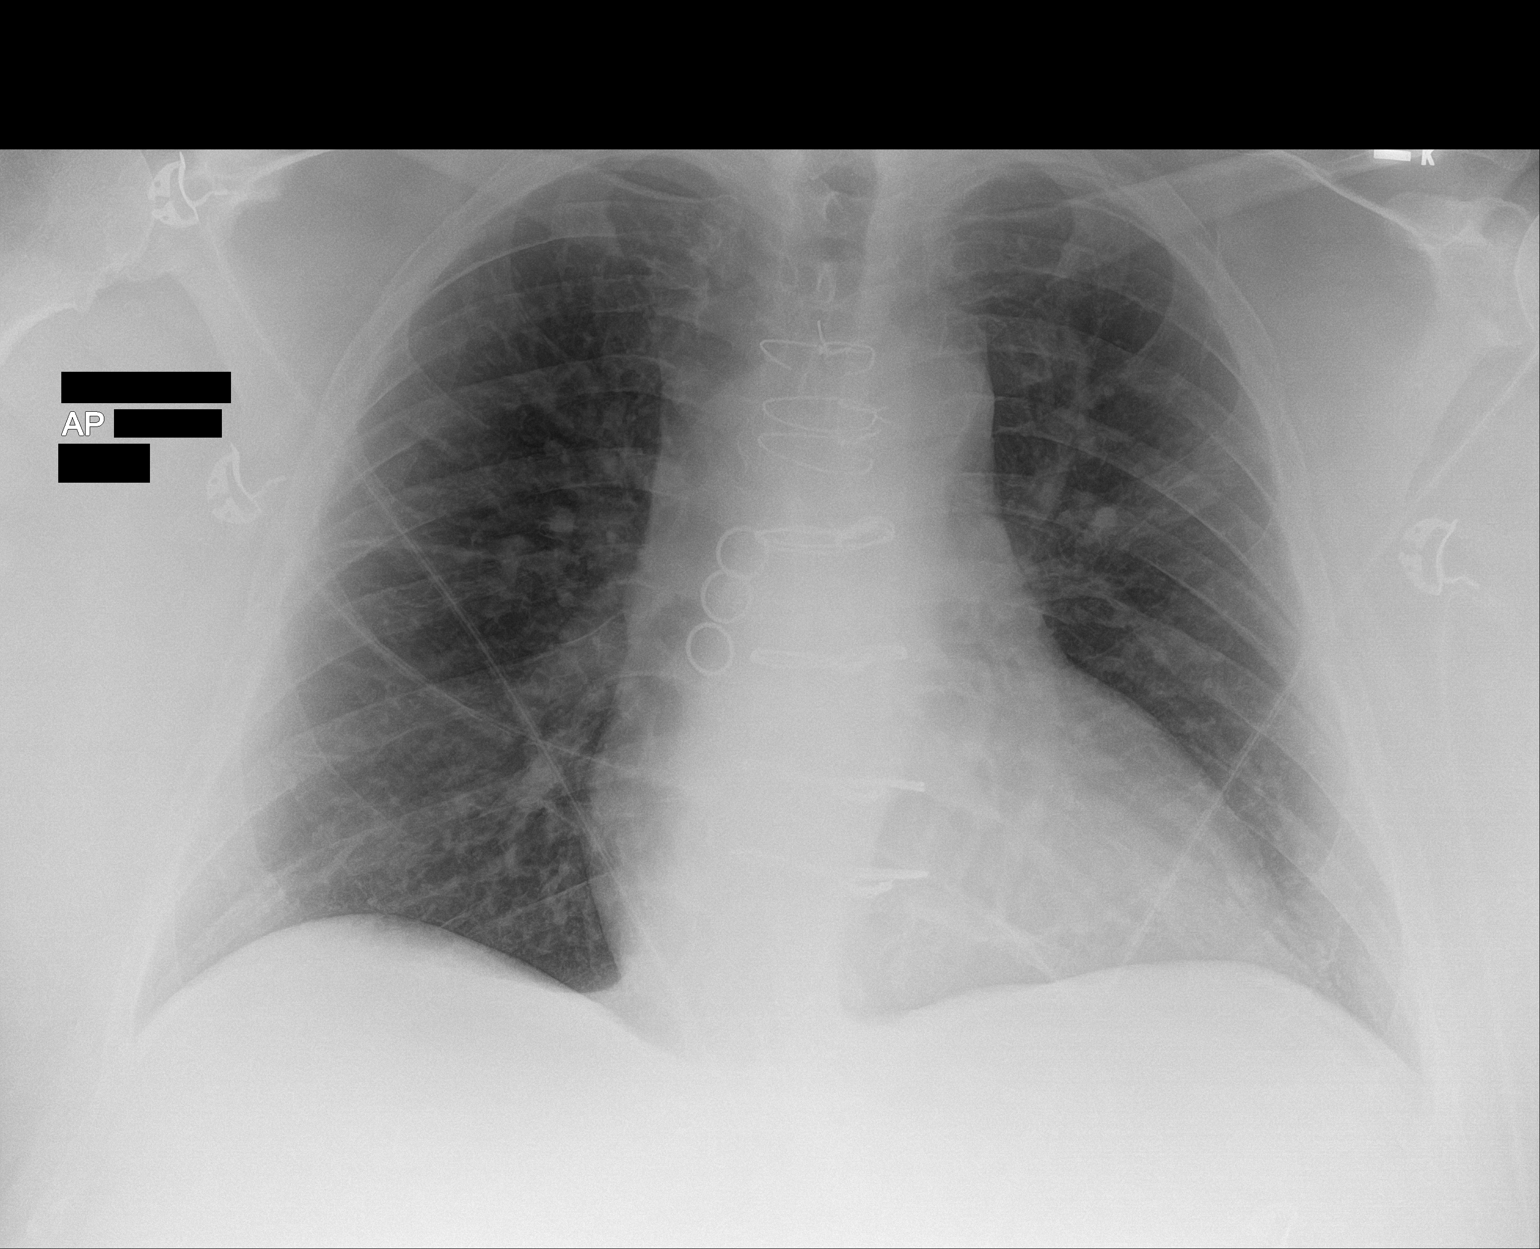

[1 of 1 positions shown; findings below may reference images not displayed]

FINDINGS: Post sternotomy changes with sternal wire fractures as before.
Cardiomegaly with vascular congestion. No acute airspace disease,
pleural effusion or pneumothorax.
IMPRESSION: No active disease.  Cardiomegaly with mild vascular congestion

## 2019-12-24 IMAGING — CR DG CHEST 2V
1 series · 2 of 2 positions shown · non-contrast
Comparison: Radiograph December 03, 2017.

CLINICAL DATA: Shortness of breath.

EXAM:
CHEST - 2 VIEW

[Series 1: dg chest 2 view · 0.14mm/px · 2 of 2 slices shown]
[im 1/2]
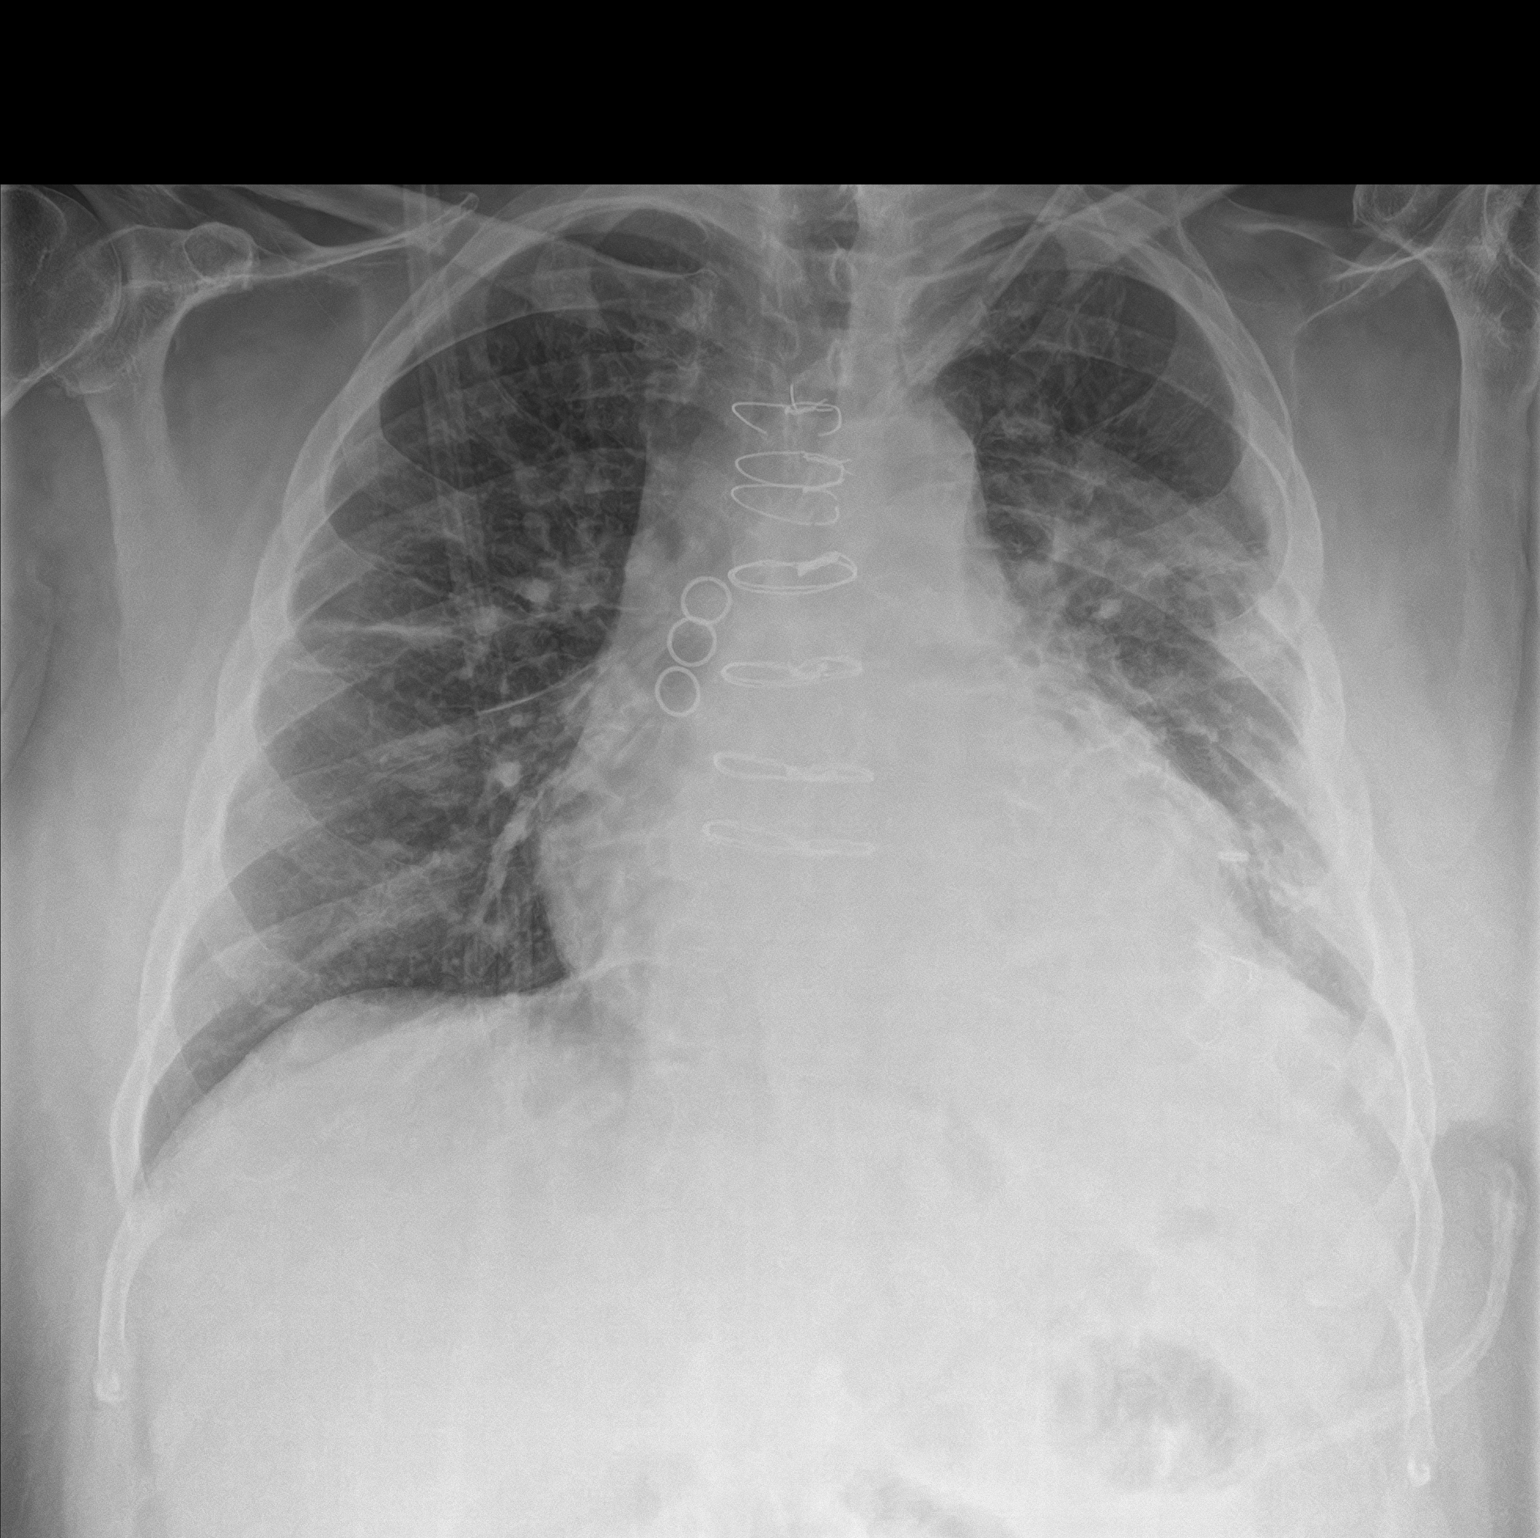
[im 2/2]
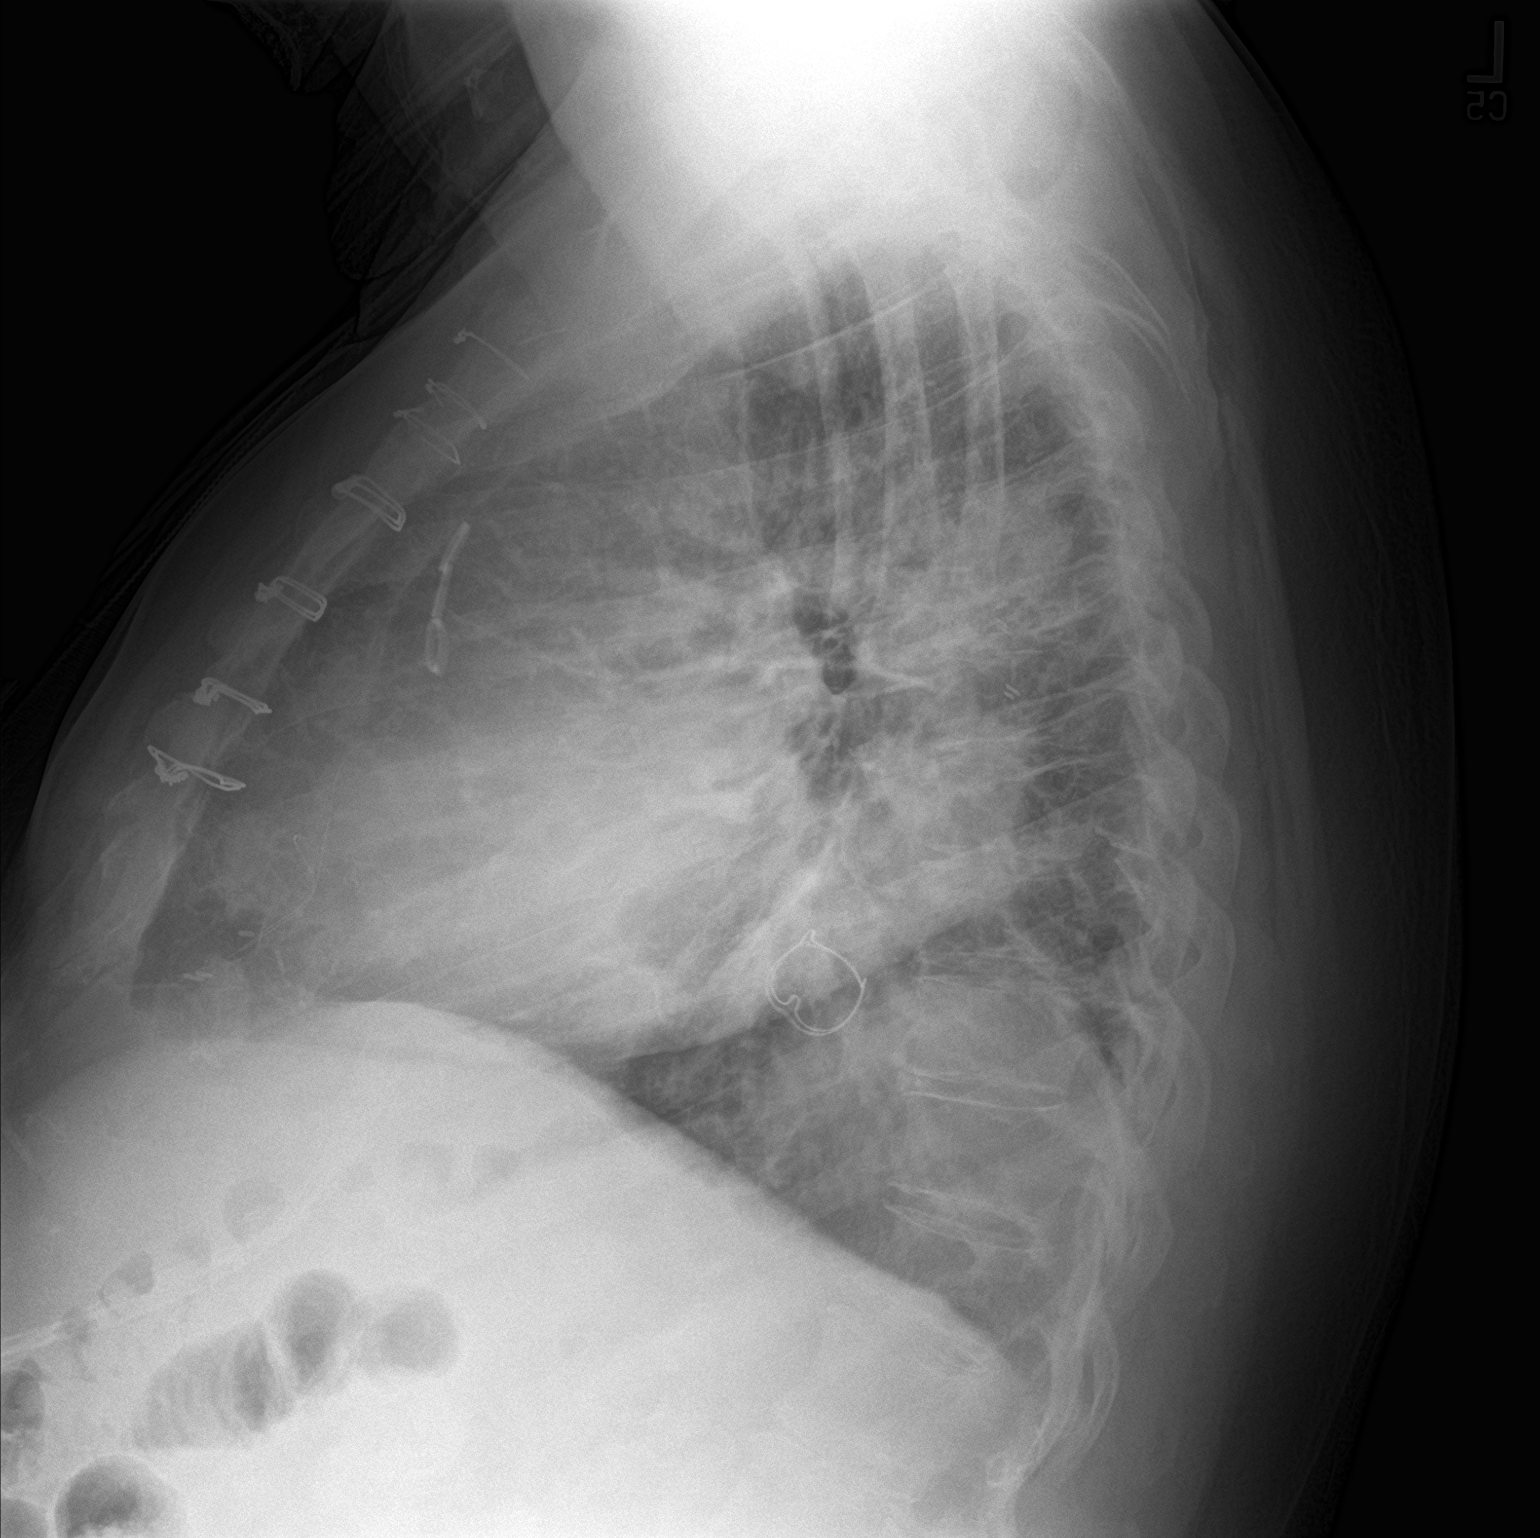

[2 of 2 positions shown; findings below may reference images not displayed]

FINDINGS: Stable cardiomegaly. Status post coronary artery bypass graft. No
pneumothorax is noted. New left perihilar and basilar opacities are
noted concerning for edema or infiltrate. Small left pleural
effusion cannot be excluded. Right midlung subsegmental atelectasis
or scarring is noted. Bony thorax is unremarkable.
IMPRESSION: New left perihilar and basilar opacities are noted concerning for
edema or infiltrate. Small left pleural effusion cannot be excluded.

## 2019-12-24 IMAGING — CT CT CHEST W/O CM
2 of 4 series · 15 of 36 positions shown, 18 images · non-contrast
Comparison: Chest x-rays dated 04/28/2018 and 12/03/2017 and chest
CT 03/24/2017

CLINICAL DATA: Increasing shortness of breath since a fall left
night onto his left side. Abnormal chest radiograph.

EXAM:
CT CHEST WITHOUT CONTRAST
TECHNIQUE: Multidetector CT imaging of the chest was performed following the
standard protocol without IV contrast.

[Series 2: thorax · axial · 0.75mm/px · z∈[+767,+1027]mm · 12 of 152 slices shown, 15 images]
[im 11/152  mediastinal]
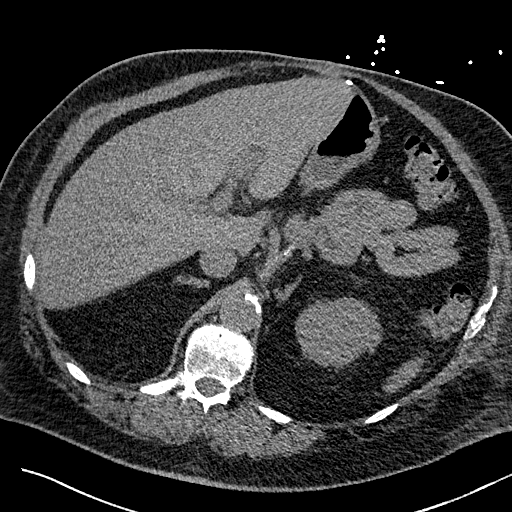
[im 11/152  lung]
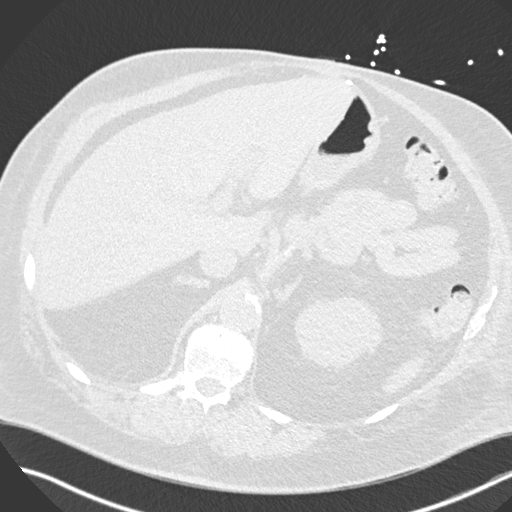
[im 22/152  lung]
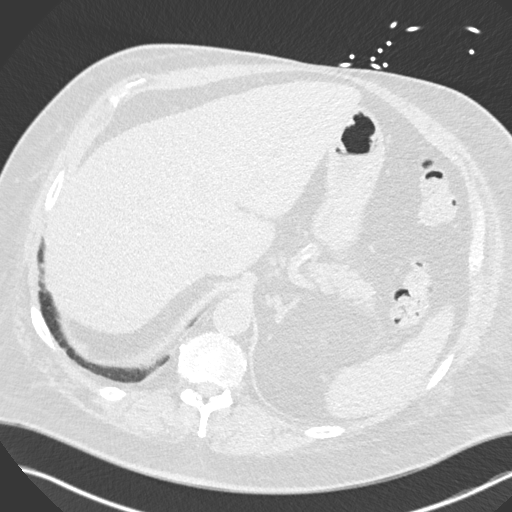
[im 33/152  lung]
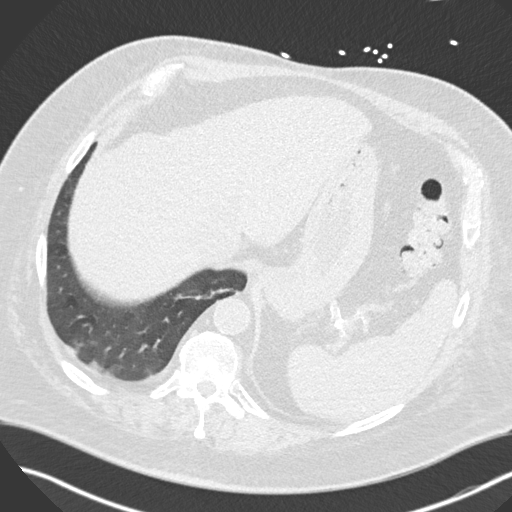
[im 44/152  lung]
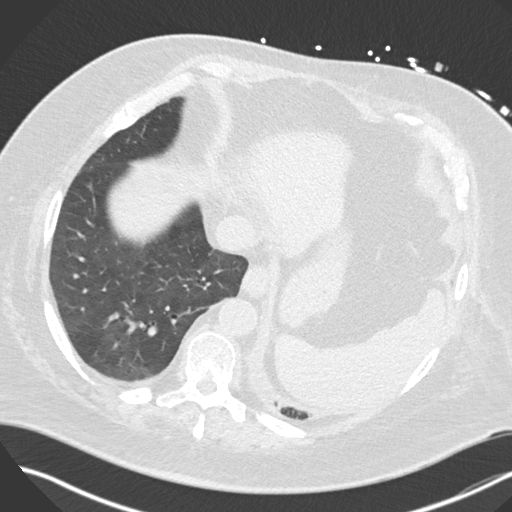
[im 54/152  mediastinal]
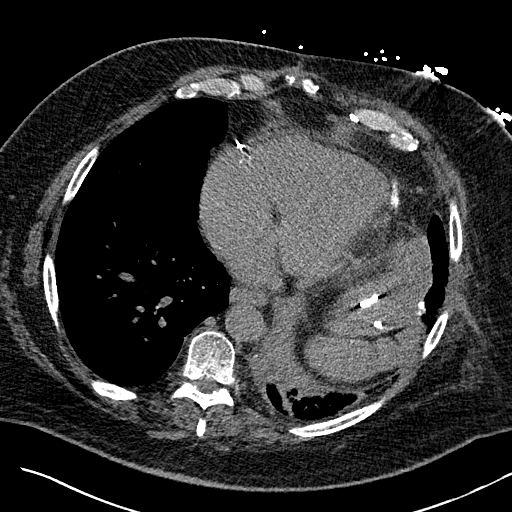
[im 54/152  lung]
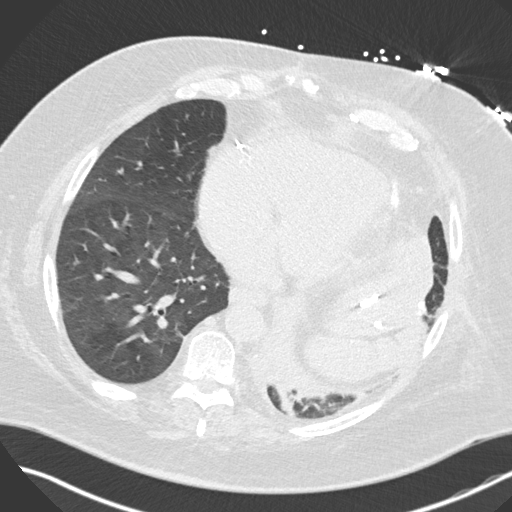
[im 65/152  lung]
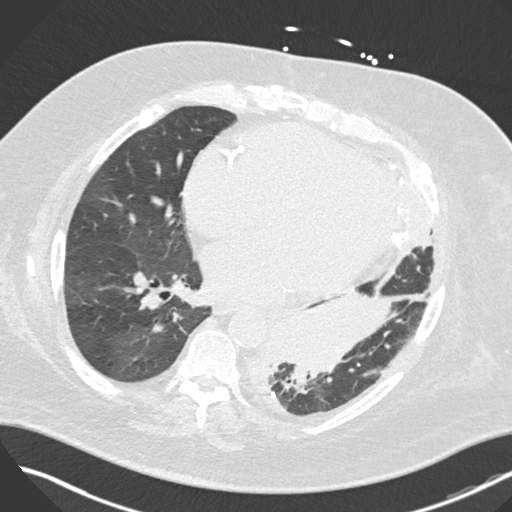
[im 87/152  lung]
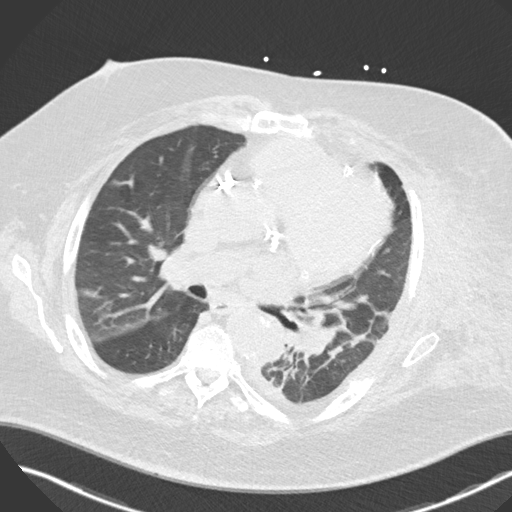
[im 98/152  lung]
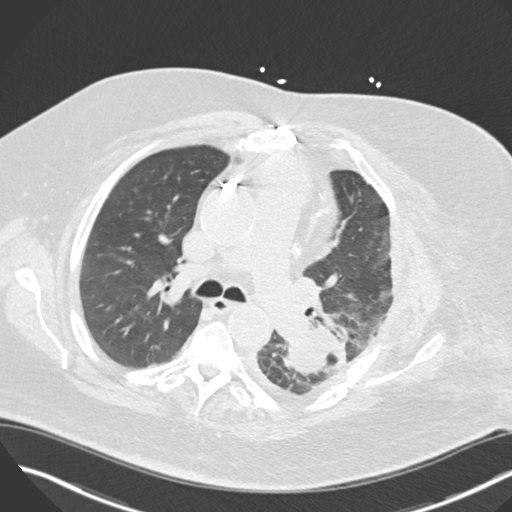
[im 108/152  mediastinal]
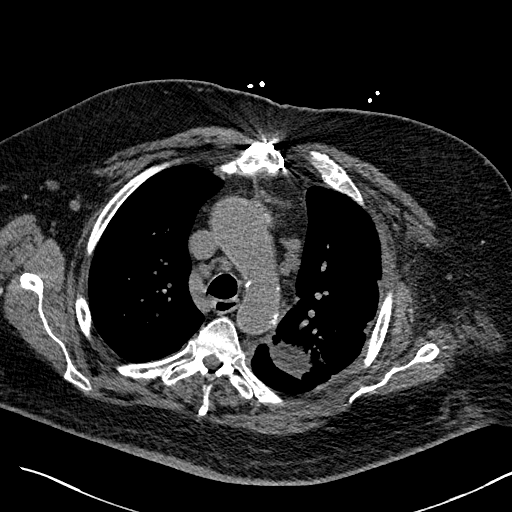
[im 108/152  lung]
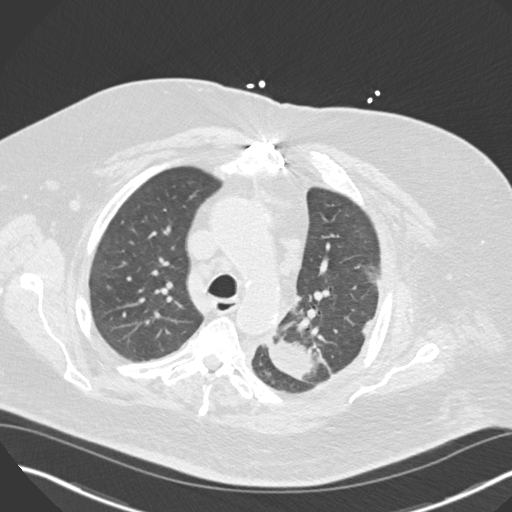
[im 119/152  lung]
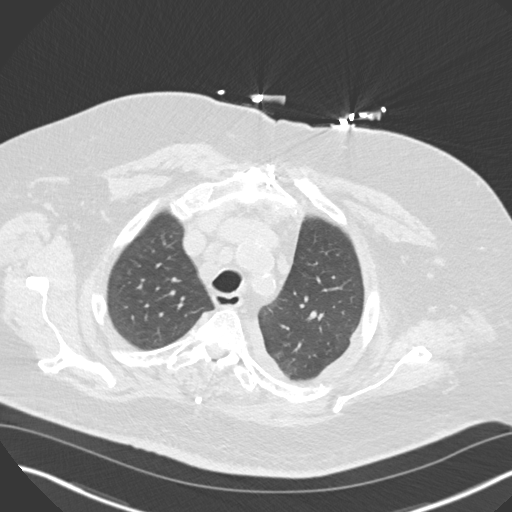
[im 130/152  lung]
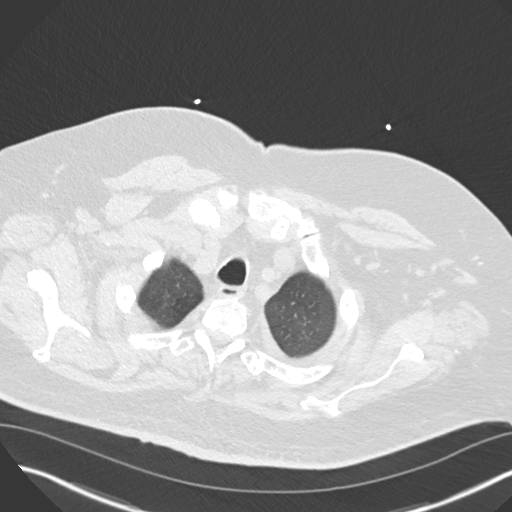
[im 141/152  lung]
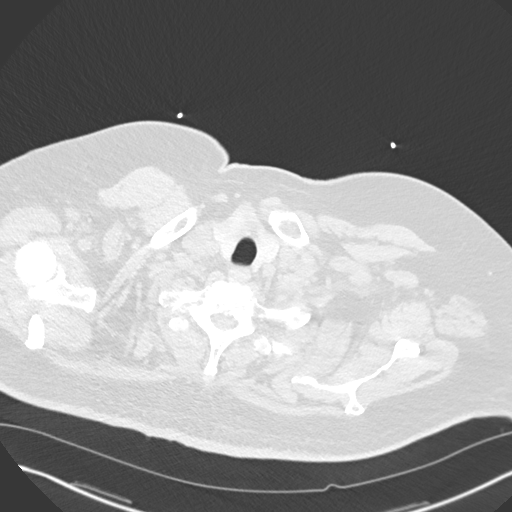

[Series 5: coronal · coronal · 0.62mm/px · 3 of 146 slices shown]
[im 30/146  lung]
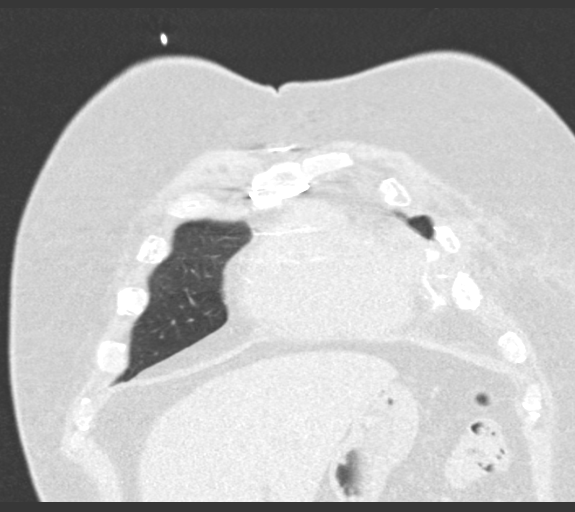
[im 59/146  lung]
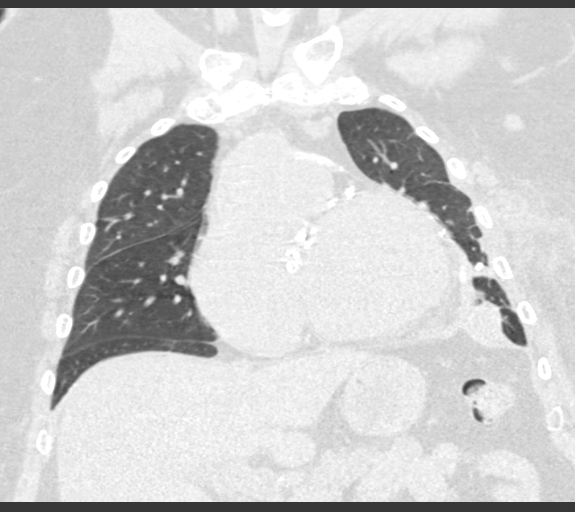
[im 88/146  lung]
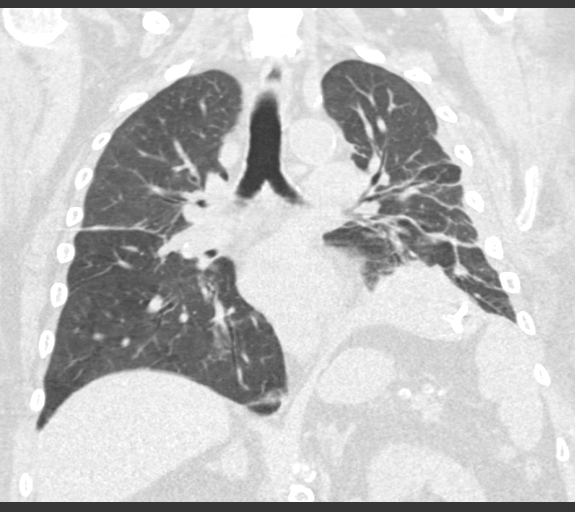

[15 of 36 positions shown; findings below may reference images not displayed]

FINDINGS: Cardiovascular: Cardiomegaly. Left ventricular apical-aortic conduit
in place with the valve in the conduit. The conduit extends from the
left ventricular apex to the descending thoracic aorta.

Extensive coronary artery calcification. Extensive aortic valvular
calcification. Aortic atherosclerosis. CABG. No pericardial
effusion.

Pulmonary vascularity appears normal.

Mediastinum/Nodes: No enlarged mediastinal or axillary lymph nodes.
Thyroid gland, trachea, and esophagus demonstrate no significant
findings.

Lungs/Pleura: There is a small amount of fluid loculated in the
superior aspect of left major fissure. Chronic pleural thickening
and slight atelectasis at the left lung base. No pneumothorax.

Upper Abdomen: No acute abnormality.

Musculoskeletal: There are multiple old left rib fractures. There is
a subacute fracture of the posterolateral aspect of the left fifth
rib. There are fractures of anterior costal cartilages of the left
third, fourth, fifth and sixth ribs.

There is extensive ill-defined soft tissue stranding in the
subcutaneous fat of the lateral aspect of the left side of the
chest. This may be due to the prior chest surgery but I cannot
exclude cellulitis. No definable hematoma or abscess. This could
also represent chest wall contusion from the patient's recent fall.
IMPRESSION: 1. Soft tissue stranding in the subcutaneous fat of the left lateral
chest wall which could represent soft tissue contusion or cellulitis
or postsurgical changes from recent chest surgery.
2. Multiple old and subacute left rib fractures.
3. Fractures of the anterior costal cartilages of multiple left
ribs.
4. Left ventricular apex to descending thoracic aorta conduit with
valve.

## 2020-01-01 IMAGING — CR DG CHEST 2V
1 series · 2 of 2 positions shown · non-contrast
Comparison: CT 04/28/2018. chest x-ray 04/28/2018, 12/03/2017.

CLINICAL DATA: Shortness of breath.

EXAM:
CHEST - 2 VIEW

[Series 1: dg chest 2 view · 0.14mm/px · 2 of 2 slices shown]
[im 1/2]
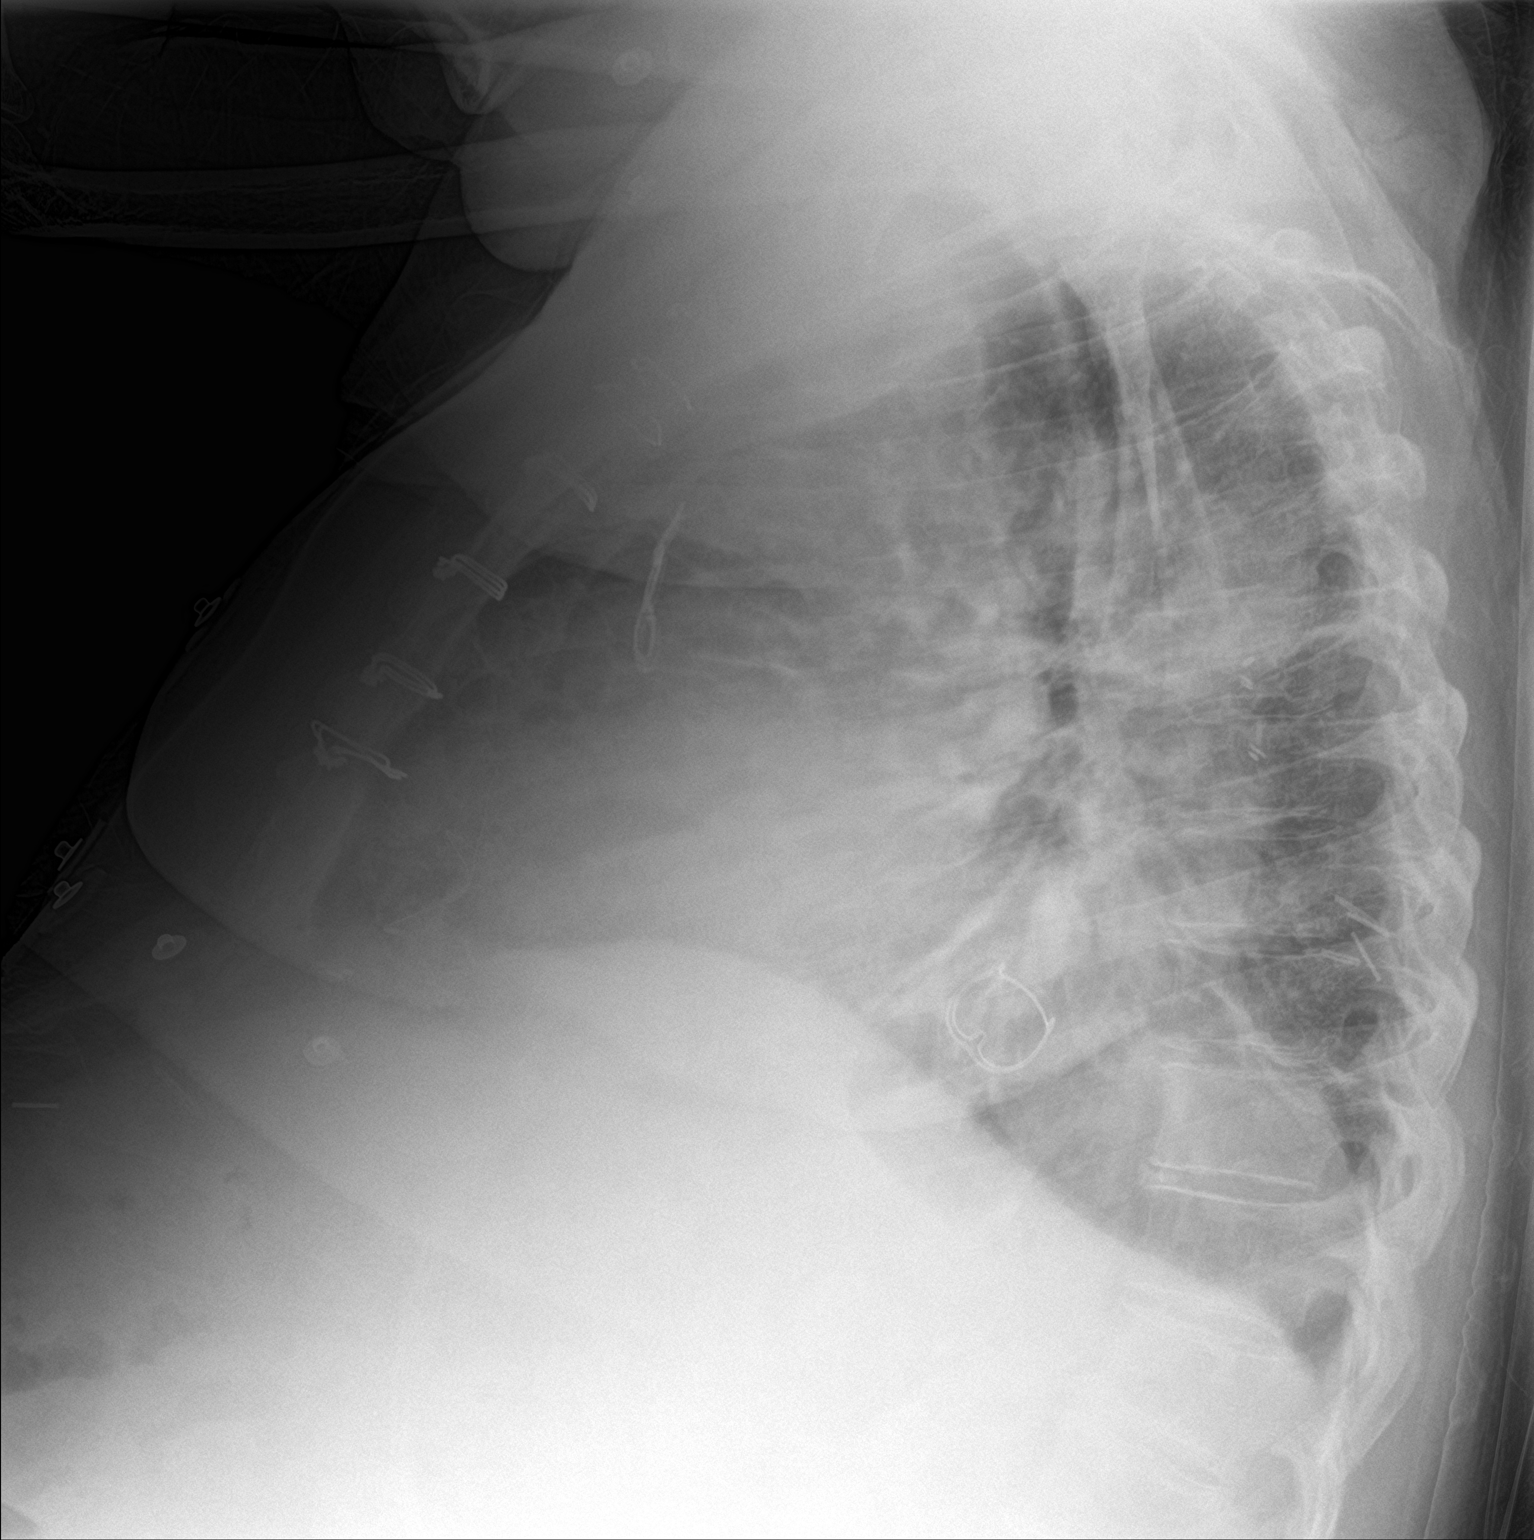
[im 2/2]
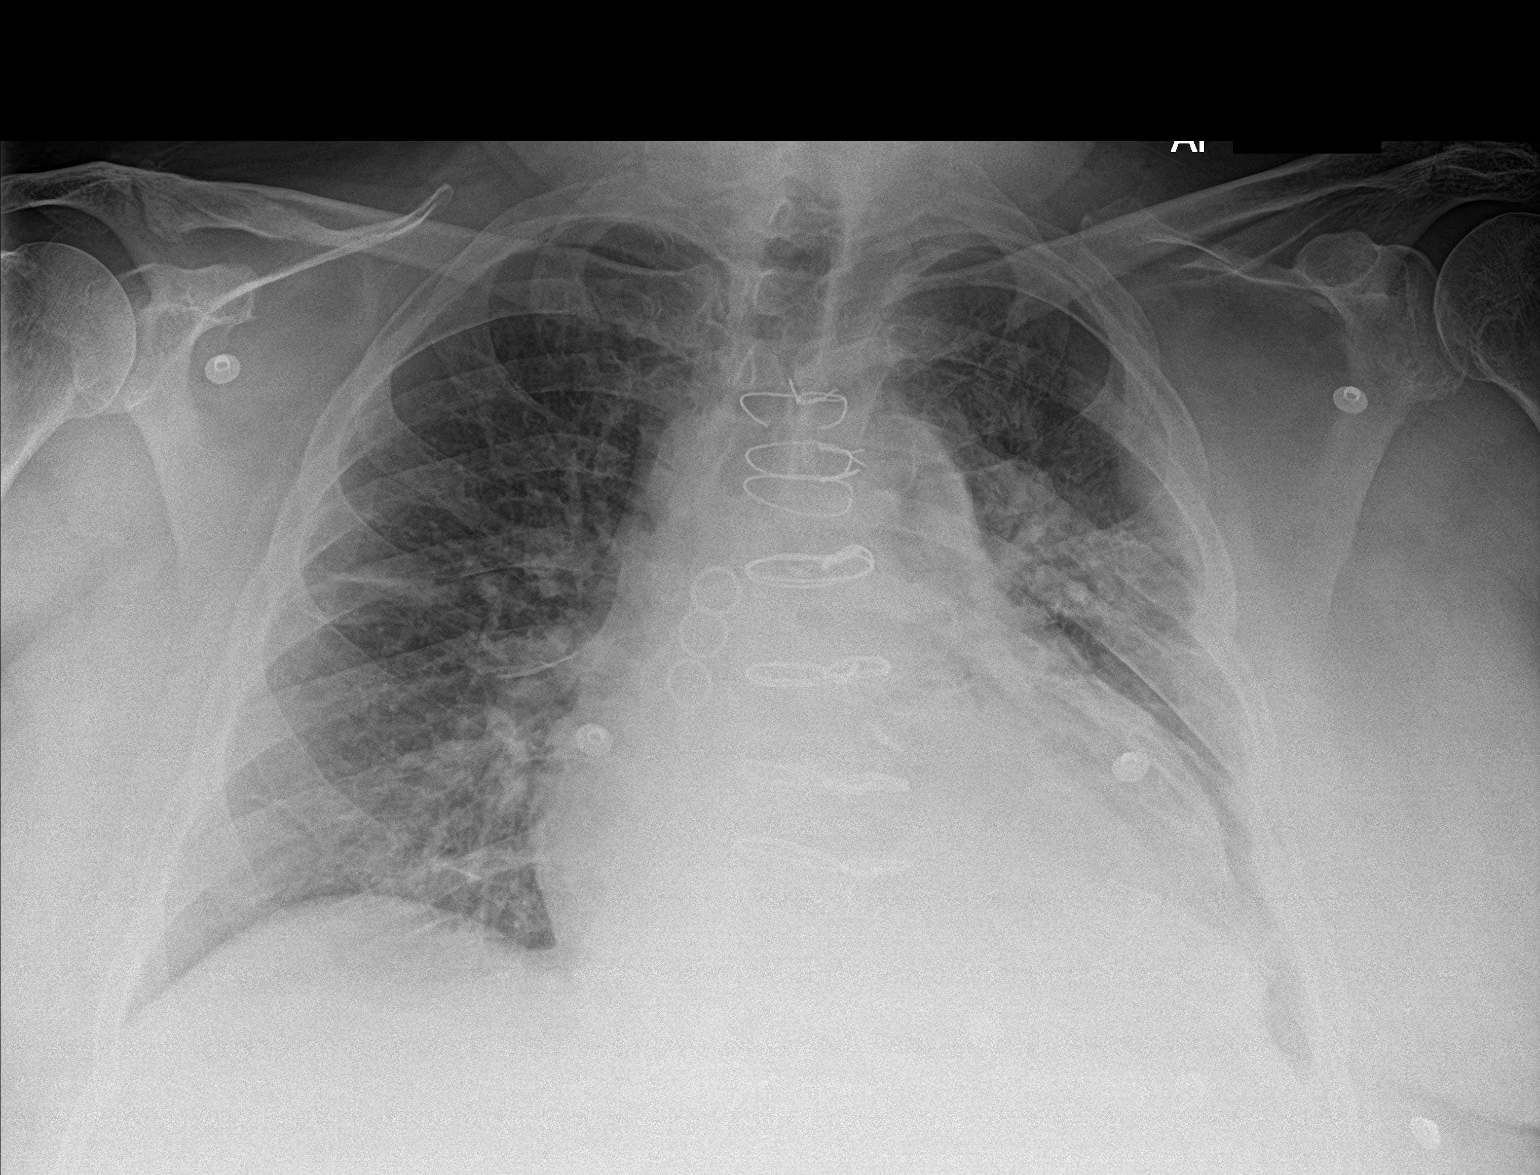

[2 of 2 positions shown; findings below may reference images not displayed]

FINDINGS: Prior CABG. Stable fracture of upper median sternotomy wire.
Cardiomegaly again noted. Aortic contour, this is best identified by
prior CT. Moderate bilateral pulmonary infiltrates/edema.
Atelectatic changes left lung base. No pneumothorax. Previously
identified rib fractures best identified by prior CT.
IMPRESSION: 1. Prior CABG. Aortic conduit, this is best identified by prior CT.
Cardiomegaly again noted. Moderate bilateral pulmonary
infiltrates/edema. A component of CHF may be present.

2.  Atelectatic changes left lung base.

## 2020-01-14 IMAGING — DX DG CHEST 1V PORT
1 series · 1 of 1 positions shown · non-contrast
Comparison: Head chest radiograph dated 05/06/2018 and CT dated
04/28/2018

CLINICAL DATA: 62-year-old male with shortness of breath

EXAM:
PORTABLE CHEST 1 VIEW

[chest ap]
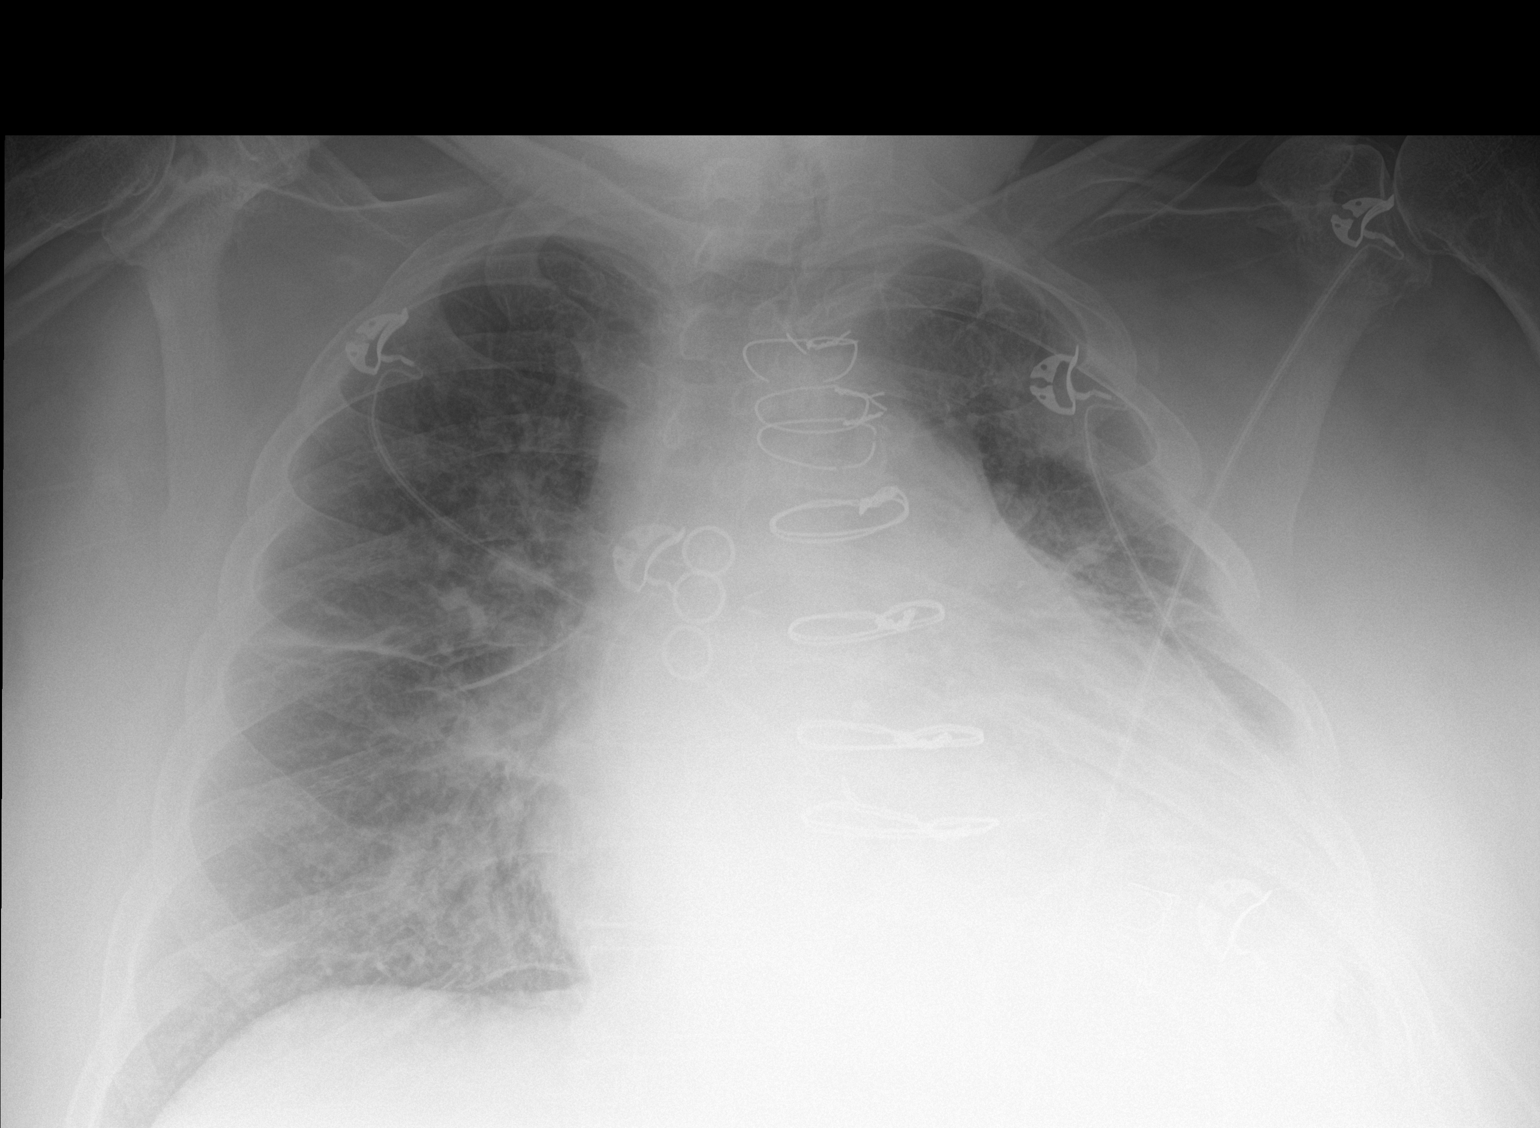

[1 of 1 positions shown; findings below may reference images not displayed]

FINDINGS: There is stable cardiomegaly. Mild bilateral perihilar vascular
prominence, likely mild vascular congestion. Increased density at
the left lung base likely combination of atelectasis and
postsurgical changes related to left ventricular/aortic conduit seen
on the CT. There is no pleural effusion or pneumothorax. Median
sternotomy wires. No acute osseous pathology.
IMPRESSION: Cardiomegaly with mild vascular congestion.

Probable atelectasis or postsurgical changes of the left lung base.

## 2020-01-17 IMAGING — CR DG CHEST 2V
2 series · 2 of 2 positions shown · non-contrast
Comparison: Chest x-ray dated May 19, 2018.

CLINICAL DATA: Hypoxia.

EXAM:
CHEST - 2 VIEW

[chest pa]
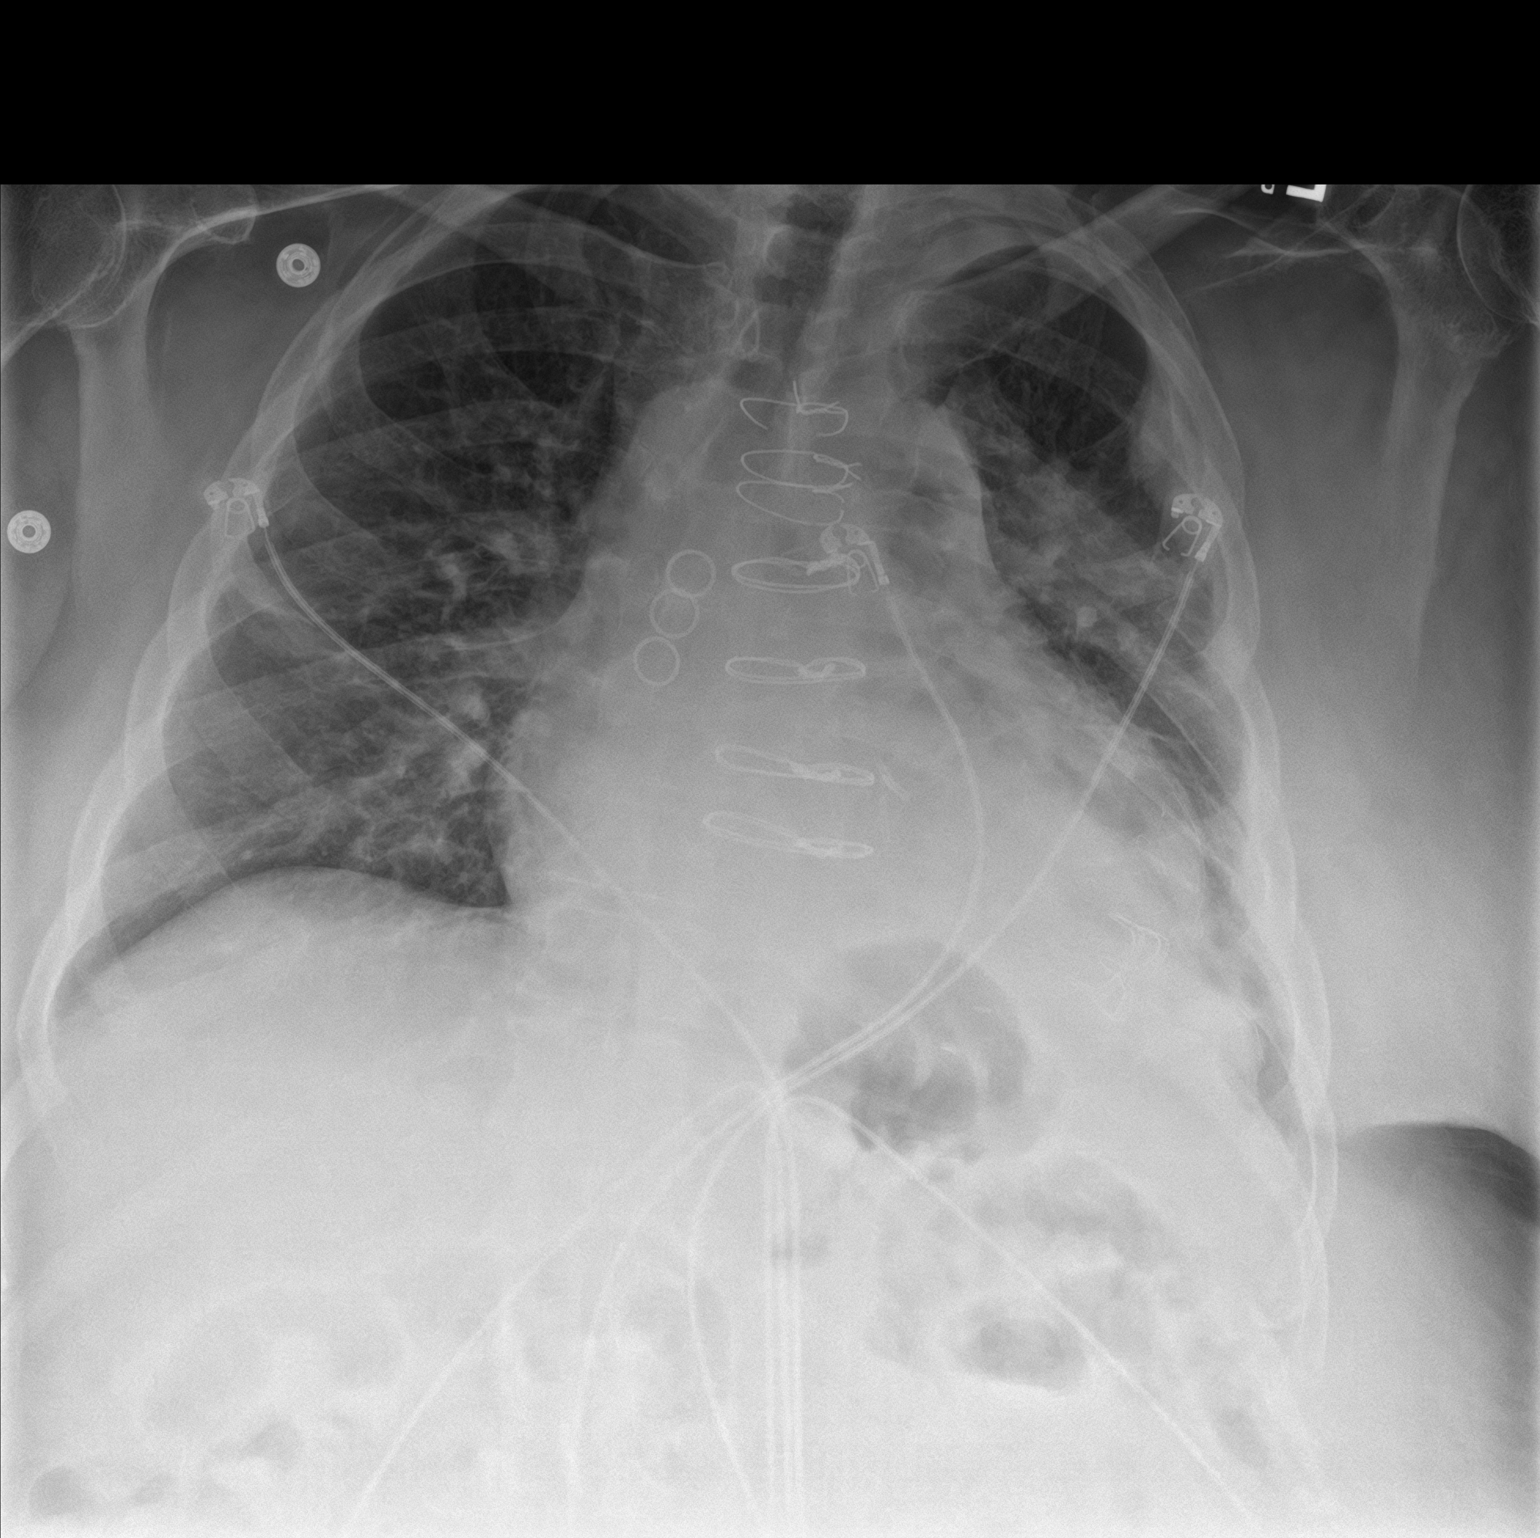

[chest lat]
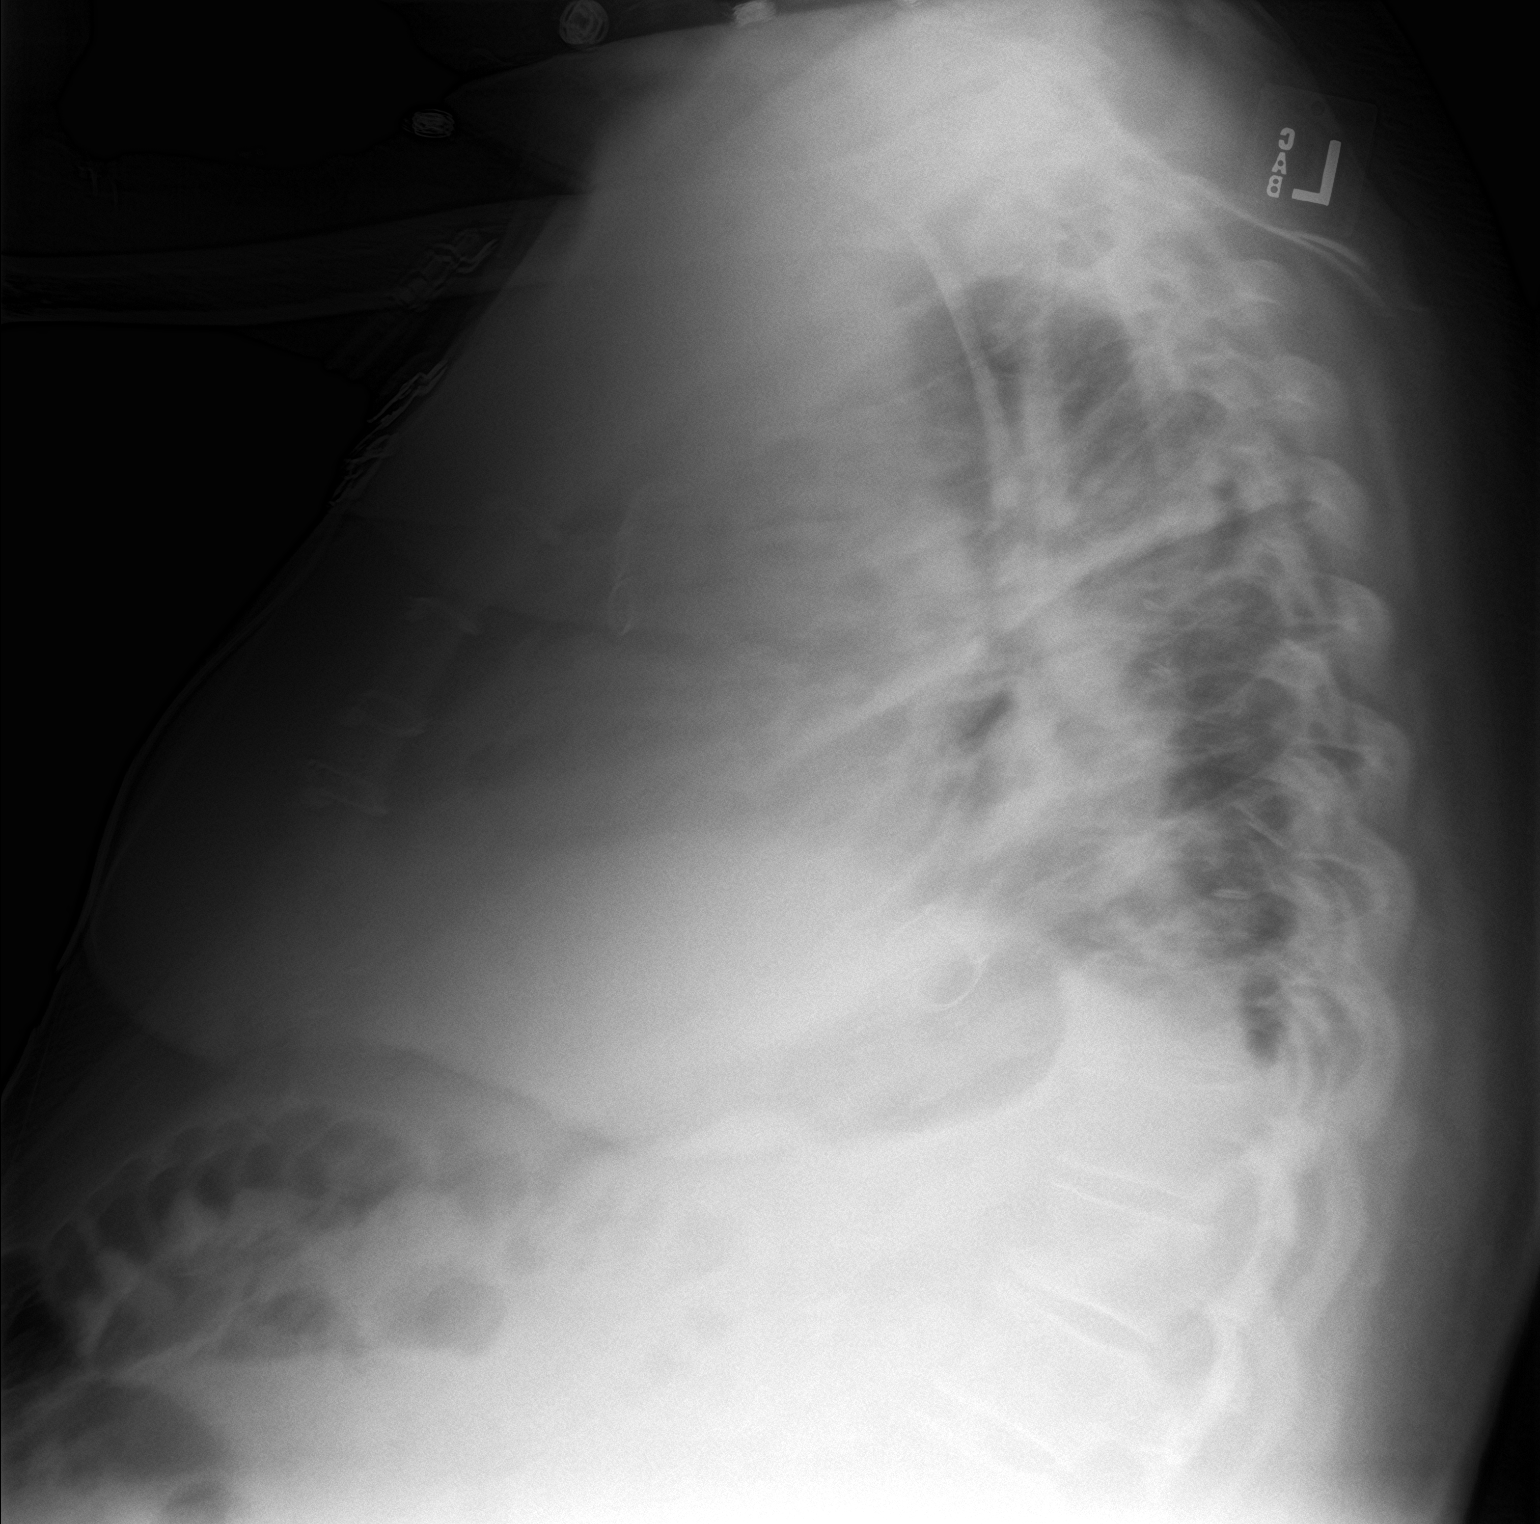

[2 of 2 positions shown; findings below may reference images not displayed]

FINDINGS: Stable cardiomegaly status post CABG. Mild pulmonary vascular
congestion is unchanged. Unchanged left ventricular/aortic conduit
in the left lower lobe. Unchanged left lower lobe atelectasis. No
pleural effusion or pneumothorax. No acute osseous abnormality.
IMPRESSION: 1. Stable cardiomegaly and pulmonary vascular congestion.
2. Unchanged postsurgical changes and atelectasis in the left lower
lobe.

## 2020-02-22 IMAGING — CR DG CHEST 2V
2 series · 2 of 2 positions shown · non-contrast
Comparison: 05/22/2018 and 05/19/2018 radiographs.

CLINICAL DATA: Nausea with dry heaves.  Headache.

EXAM:
CHEST - 2 VIEW

[chest pa]
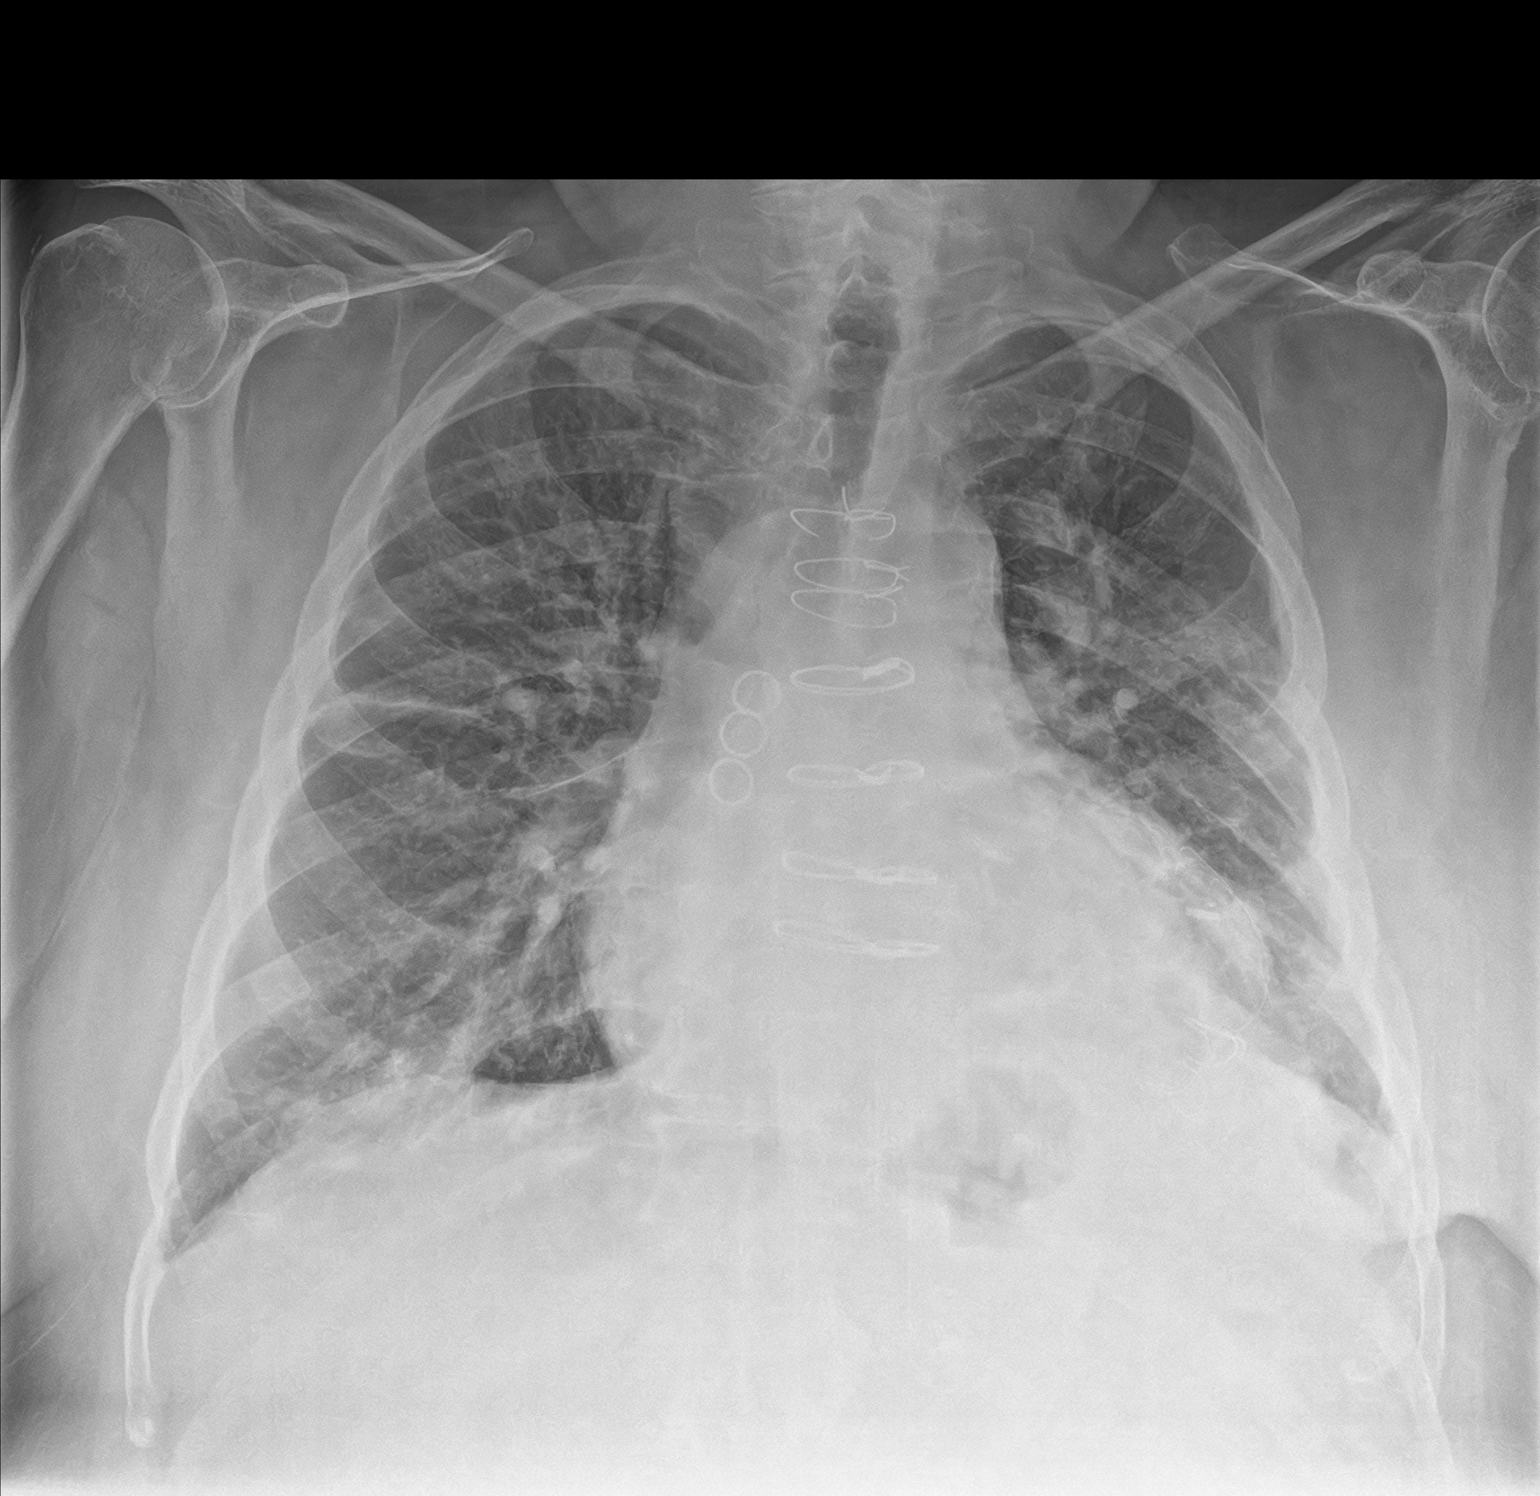

[chest lat]
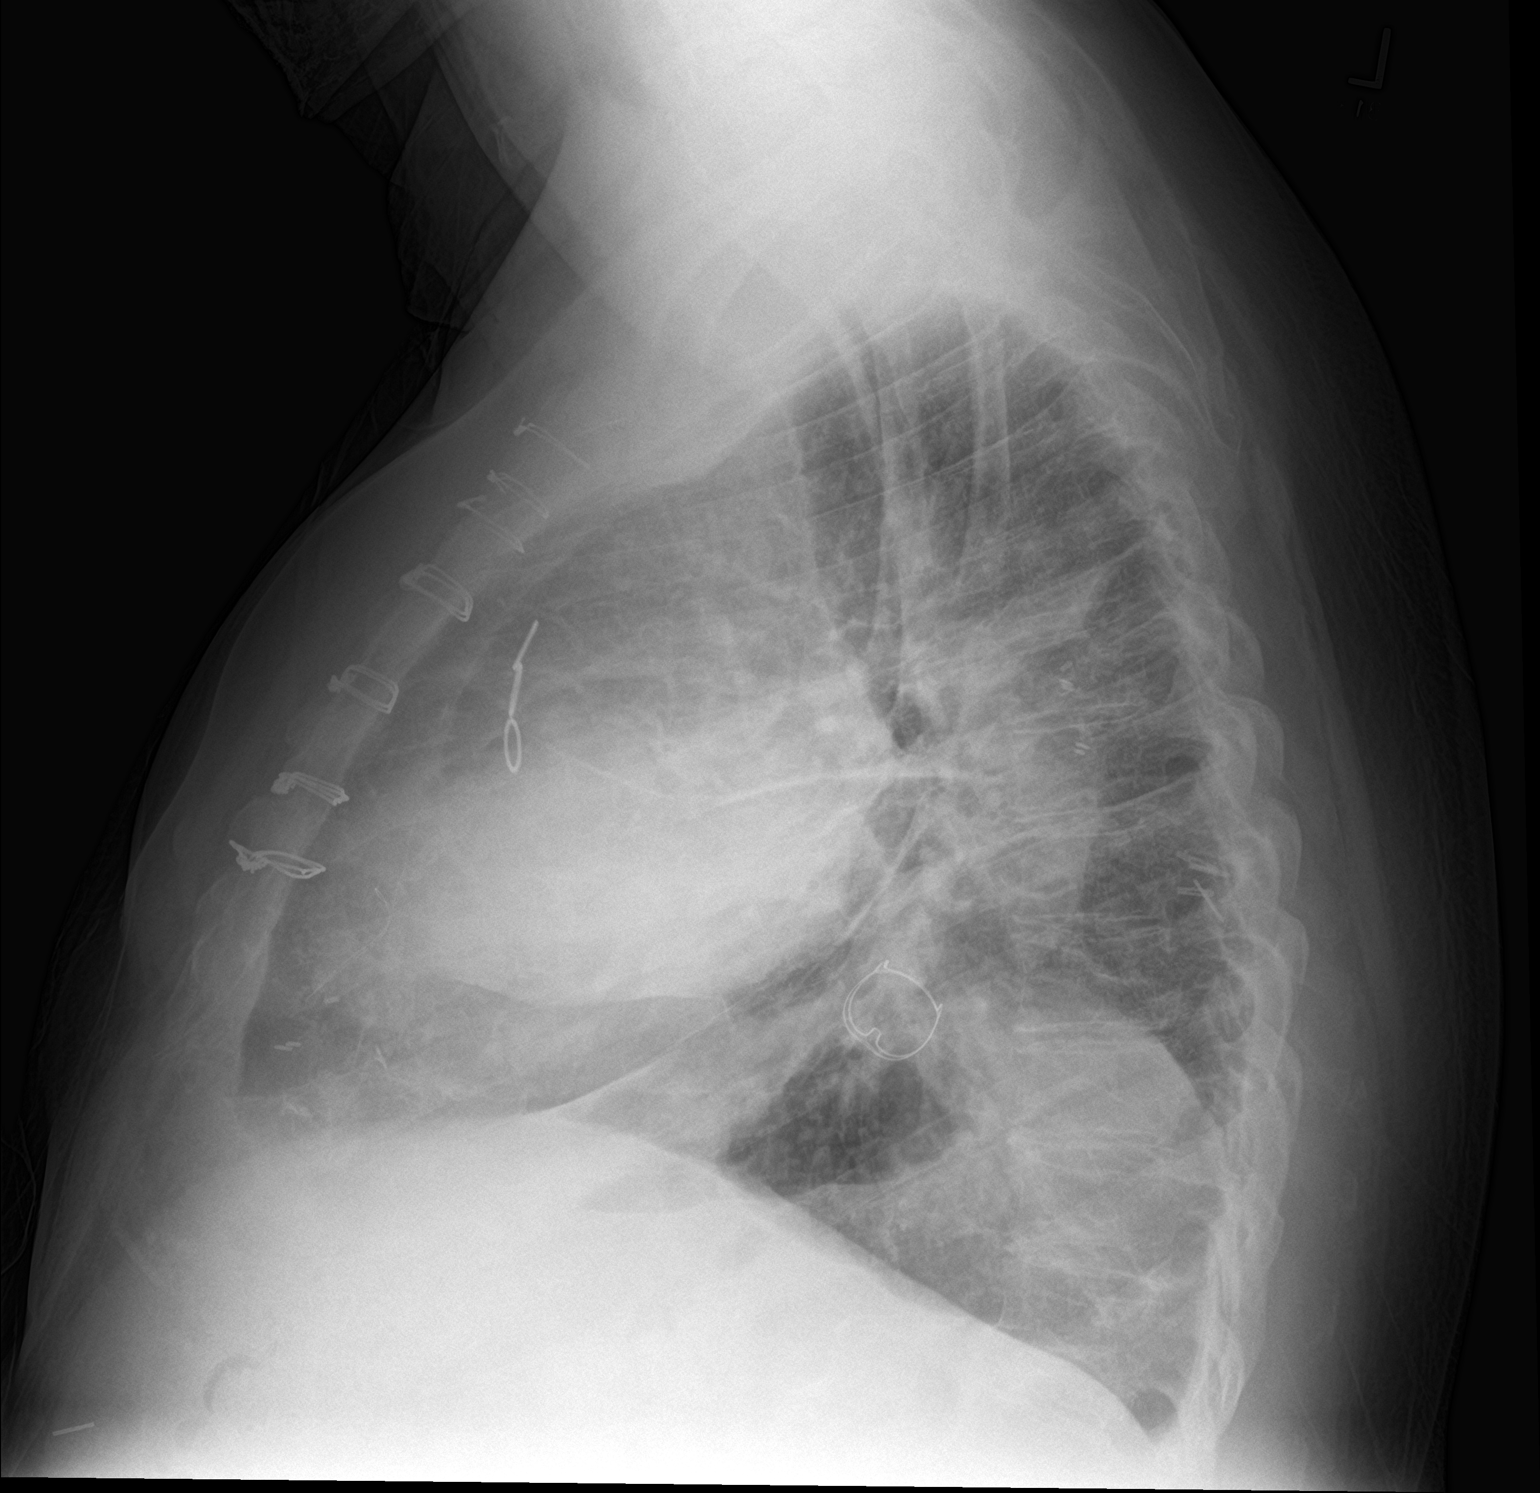

[2 of 2 positions shown; findings below may reference images not displayed]

FINDINGS: Stable cardiomegaly and aortic atherosclerosis status post median
sternotomy, CABG and left ventricular apical/aortic conduit
placement. Diffuse interstitial prominence and
left-greater-than-right basilar pulmonary opacities do not appear
significantly changed. There is no pneumothorax or significant
pleural effusion. No acute osseous findings are evident.
IMPRESSION: Similar postoperative radiographic appearance of the chest with
probable chronic vascular congestion and left basilar scarring. No
apparent acute findings.

## 2020-02-27 IMAGING — DX DG CHEST 1V PORT
1 series · 1 of 1 positions shown · non-contrast
Comparison: 07/01/2018

CLINICAL DATA: Shortness of breath

EXAM:
PORTABLE CHEST 1 VIEW

[chest]
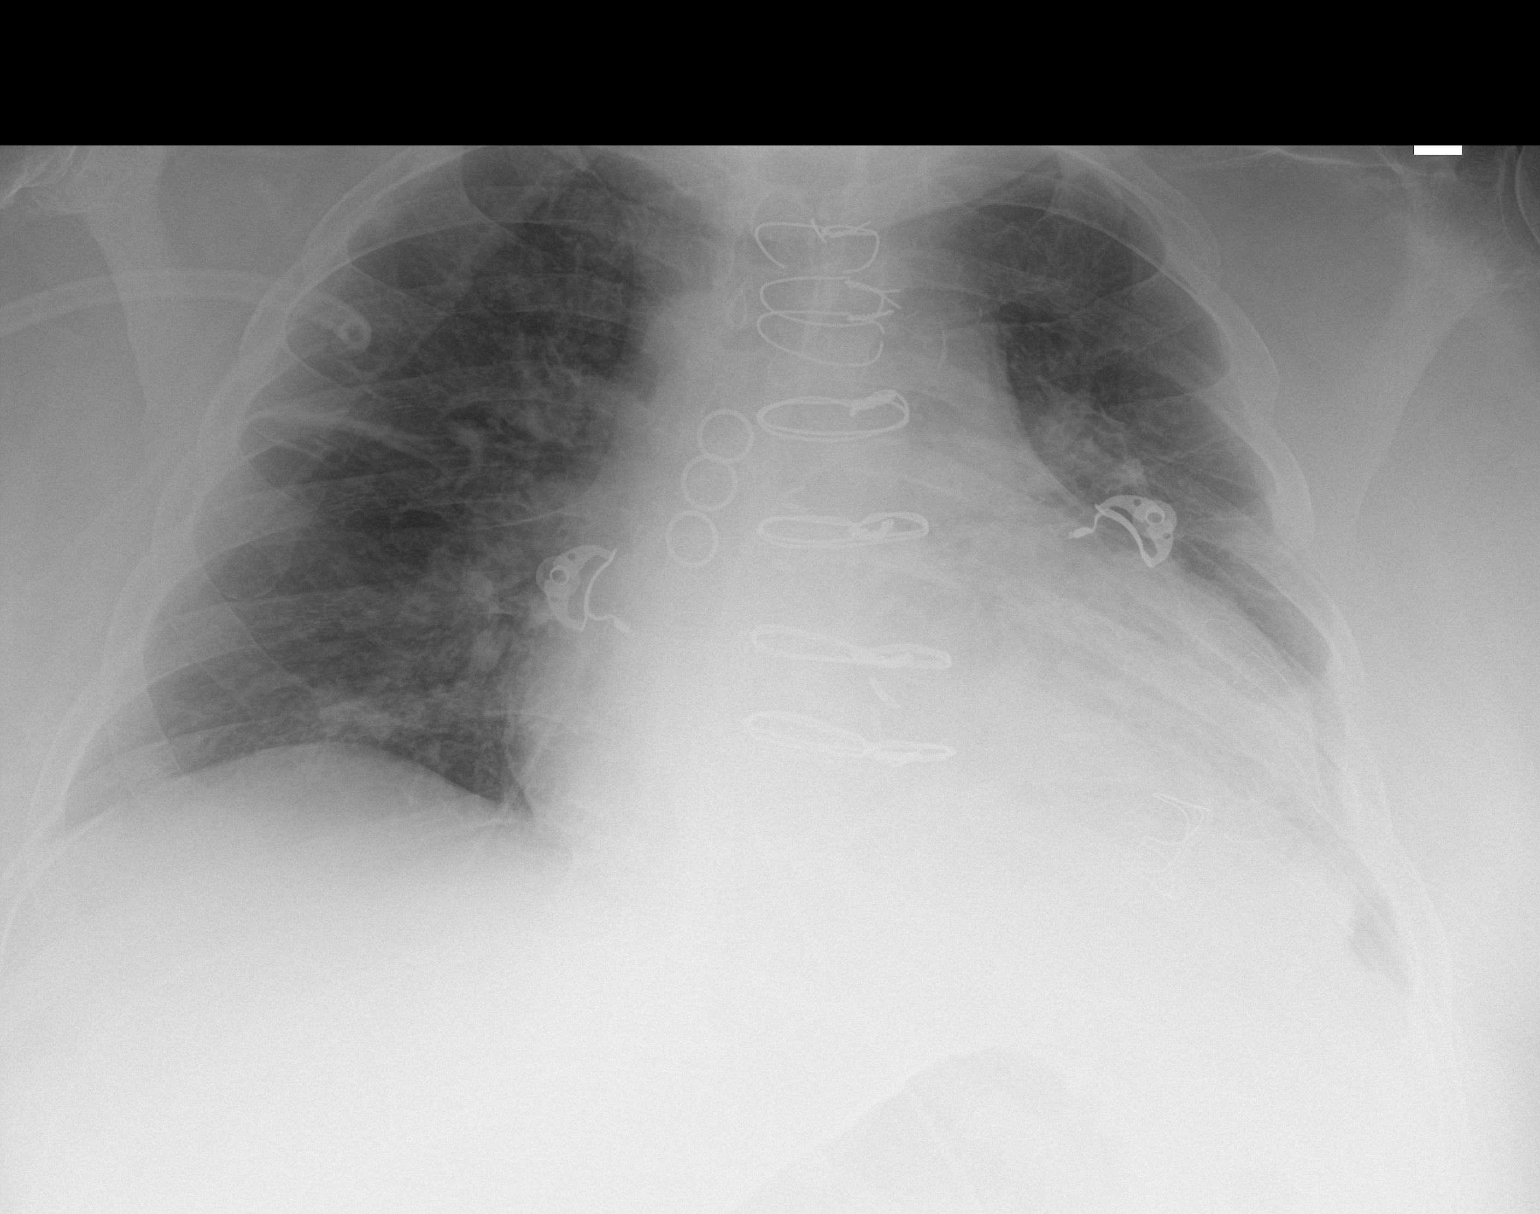

[1 of 1 positions shown; findings below may reference images not displayed]

FINDINGS: There is moderate cardiomegaly and findings of prior CABG and
valvuloplasty, unchanged. There is no focal airspace consolidation.
There is atelectasis at the left lung base. No pneumothorax. No
pulmonary edema.
IMPRESSION: Unchanged cardiomegaly without pulmonary edema. Left basilar
atelectasis.

## 2020-02-28 IMAGING — DX DG CHEST 1V PORT
1 series · 1 of 1 positions shown · non-contrast
Comparison: July 02, 2018

CLINICAL DATA: Shortness of breath

EXAM:
PORTABLE CHEST 1 VIEW

[chest ap]
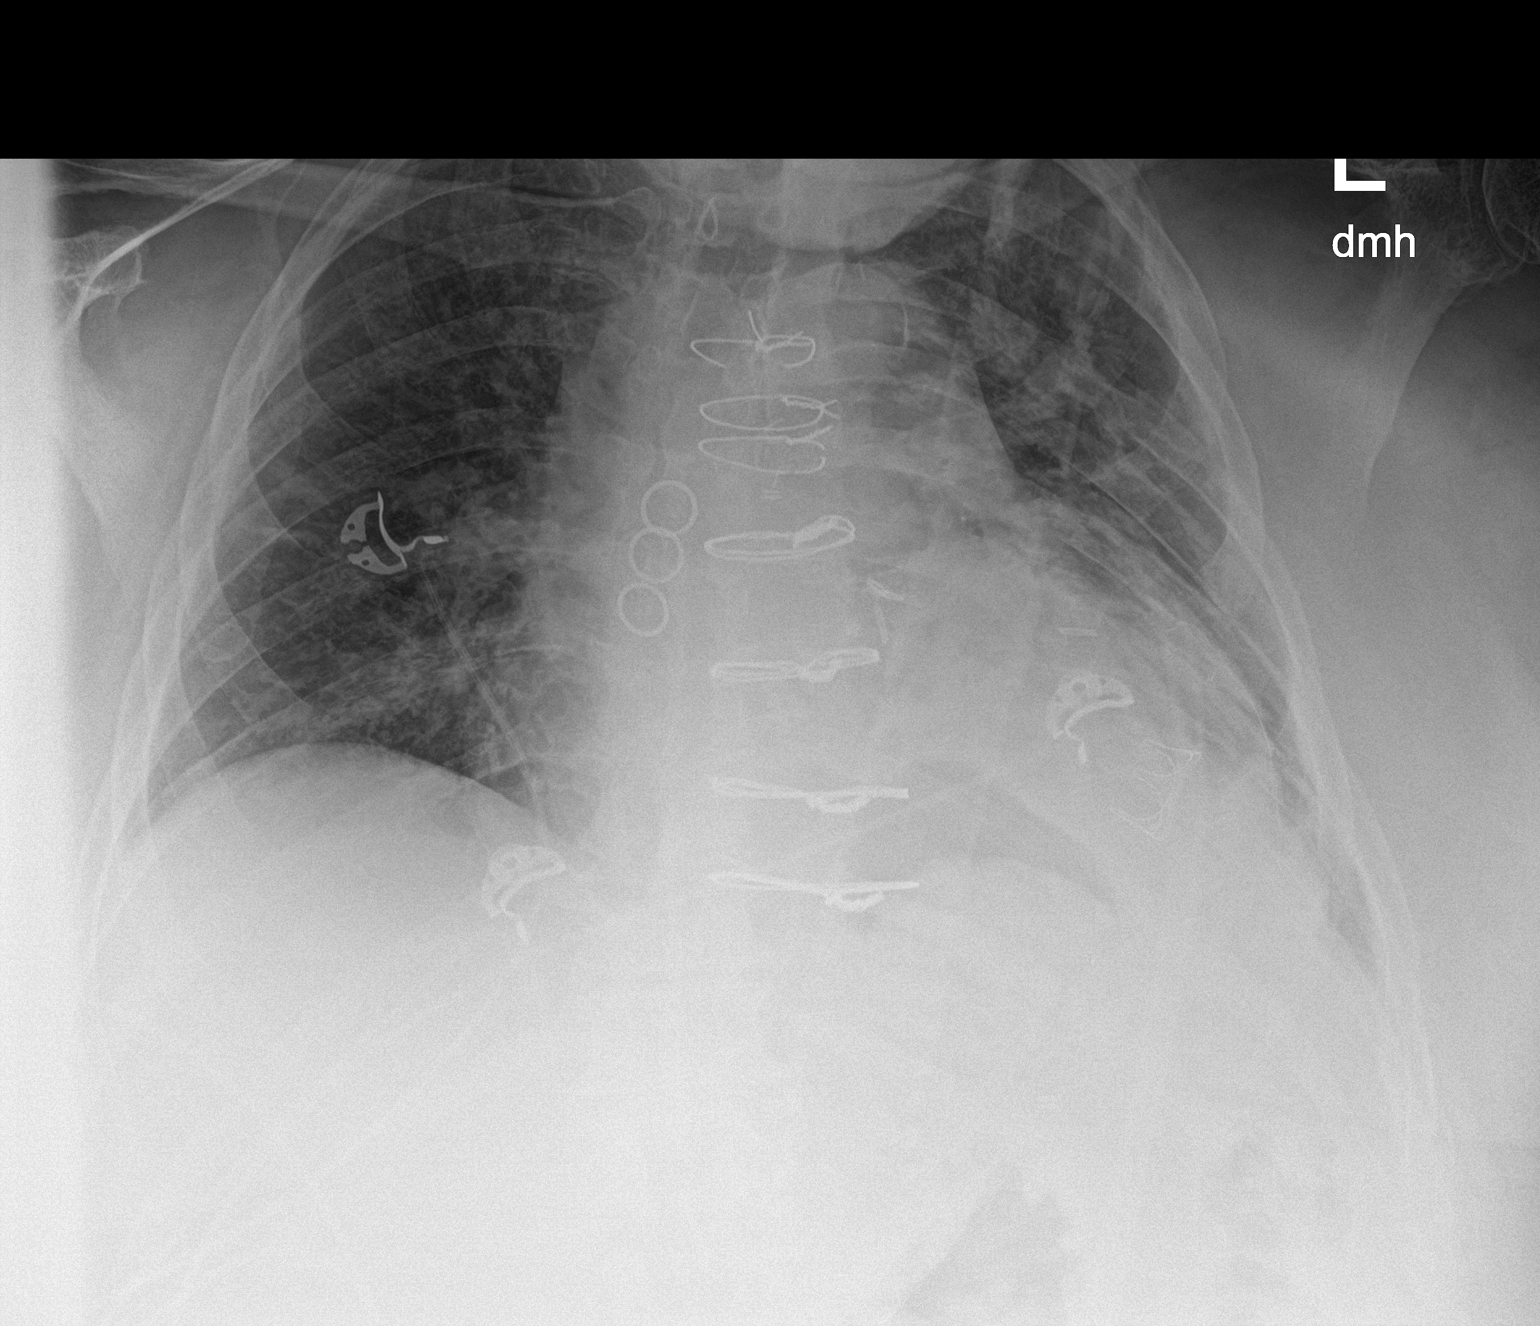

[1 of 1 positions shown; findings below may reference images not displayed]

FINDINGS: There is airspace consolidation in the left lower lobe region. Right
lung is clear. There is cardiomegaly with pulmonary vascularity
within normal limits. Patient is status post coronary artery bypass
grafting. The peripherally placed valve replacement is within a
surgically created aortic conduit, better appreciated on lateral
view from several days prior. There is aortic atherosclerosis. No
adenopathy evident. No bone lesions.
IMPRESSION: Airspace consolidation concerning for pneumonia left lower lobe.
Lungs elsewhere clear. Stable cardiac silhouette. Valve replacement
unusually peripherally located on the left is noted on lateral view
to be within a surgically created aortic conduit. Valve position is
stable. There is aortic atherosclerosis.

Aortic Atherosclerosis ([id number]).

## 2020-02-28 IMAGING — DX DG CHEST 1V PORT
1 series · 1 of 1 positions shown · non-contrast
Comparison: 07/03/2017 at 3530 hours.

CLINICAL DATA: Encounter for endotracheal tube placement. Difficult
airway for inhibition.

EXAM:
PORTABLE CHEST 1 VIEW

[chest]
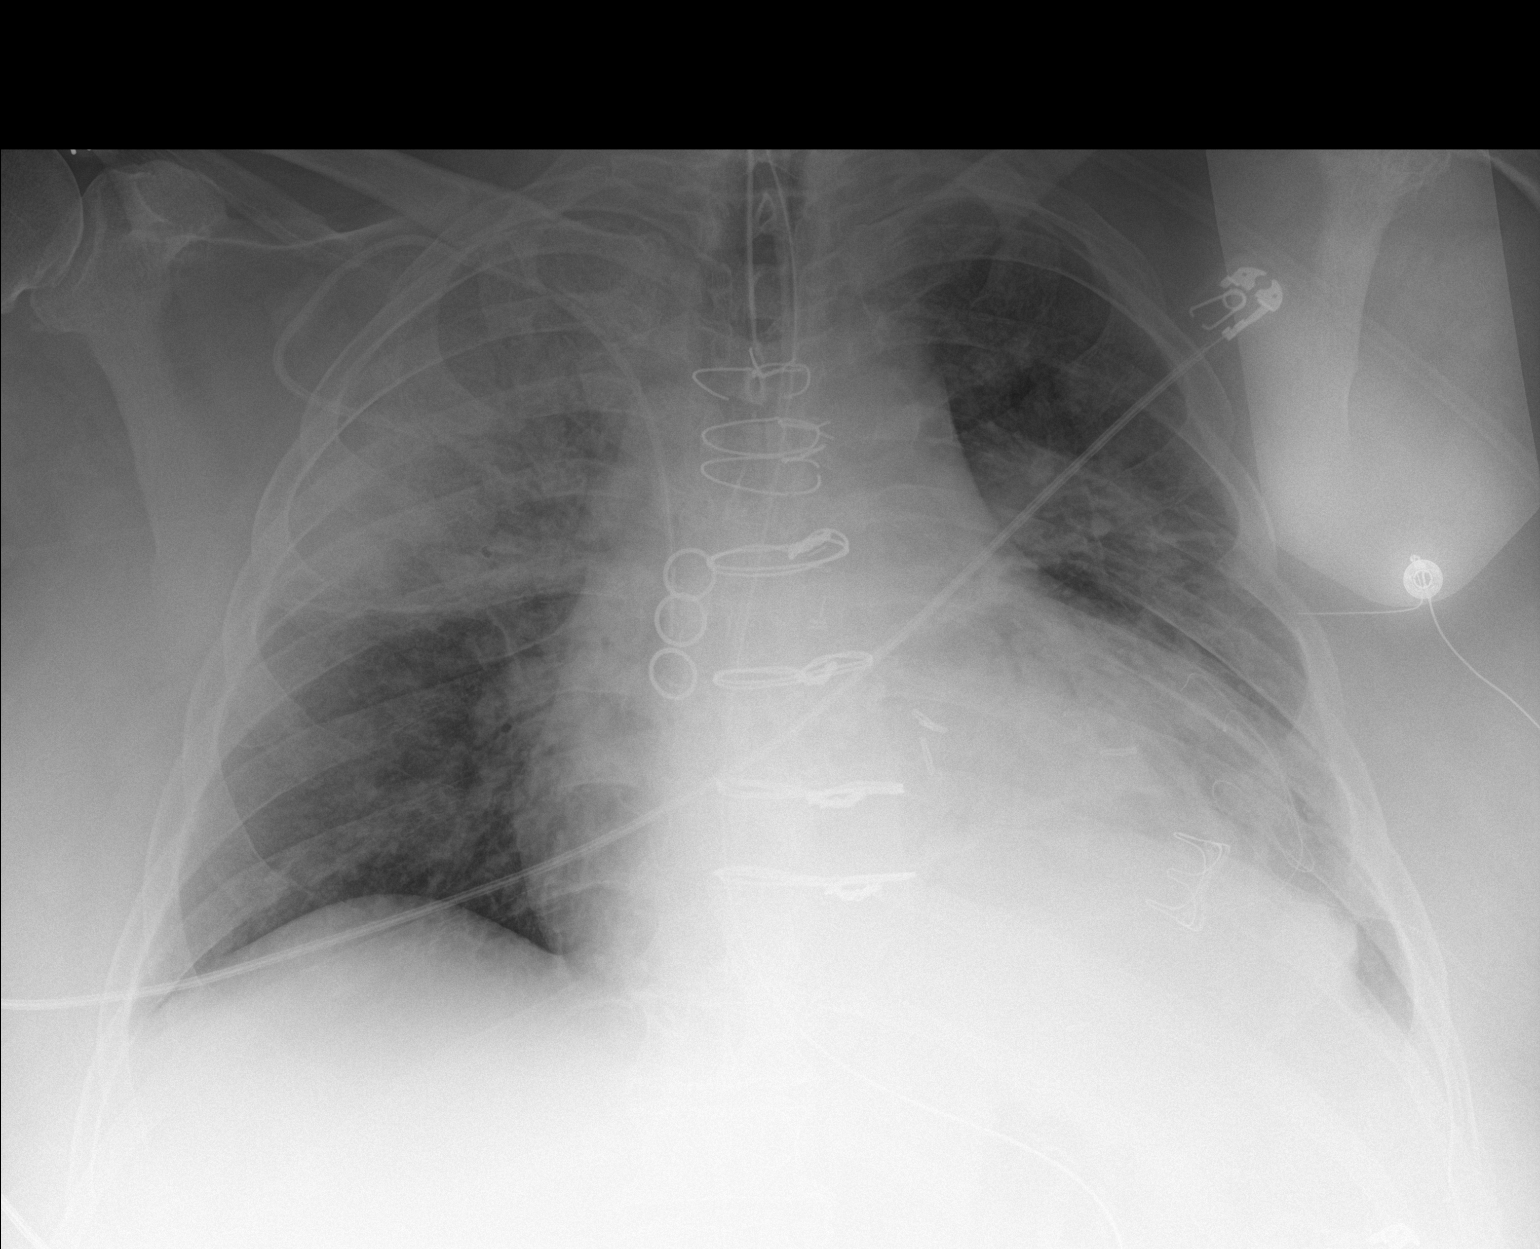

[1 of 1 positions shown; findings below may reference images not displayed]

FINDINGS: Endotracheal tube is roughly 4 cm above the carina. Nasogastric tube
extends into the abdomen. There is new extensive airspace disease in
the right upper lung. Right subclavian central line has been placed
and tip in the lower SVC region. Negative for a pneumothorax. Again
noted are densities at left lung base with a surgical device and
history of a left ventricle to aortic conduit. Hazy densities in
left hilum.
IMPRESSION: 1. Extensive new airspace disease in the right upper lung. Findings
concerning for aspiration based on the history of difficult
intubation.
2. Endotracheal tube is appropriately positioned.
3. Right subclavian line in the lower SVC. Negative for
pneumothorax.
4. Stable postsurgical changes in left lower chest.

## 2020-02-29 IMAGING — US US RENAL
1 series · 14 of 25 positions shown · non-contrast
Comparison: 05/22/2018 renal ultrasound. CT Abdomen and Pelvis
03/24/2017.

CLINICAL DATA: 62-year-old male with acute kidney injury.

EXAM:
RENAL / URINARY TRACT ULTRASOUND COMPLETE

[Series 1: us renal · 14 of 34 slices shown]
[im 1/34]
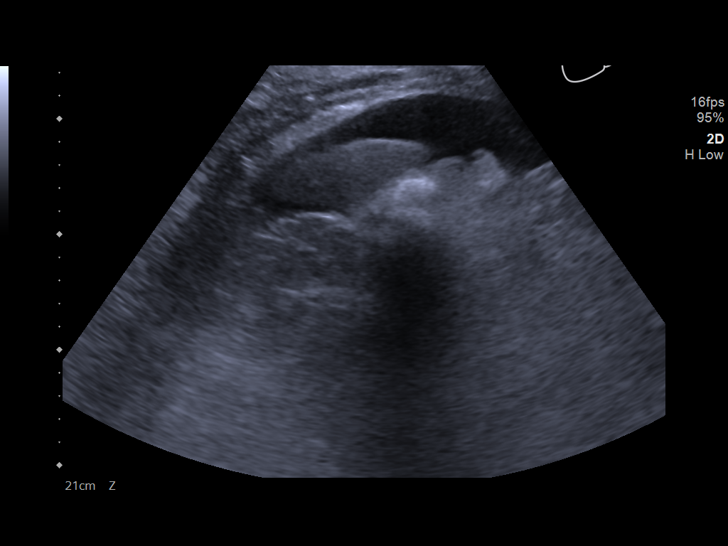
[im 3/34]
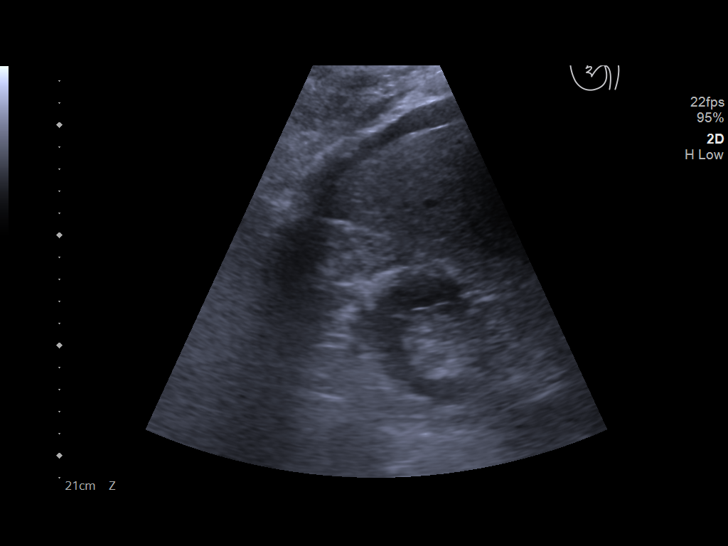
[im 6/34]
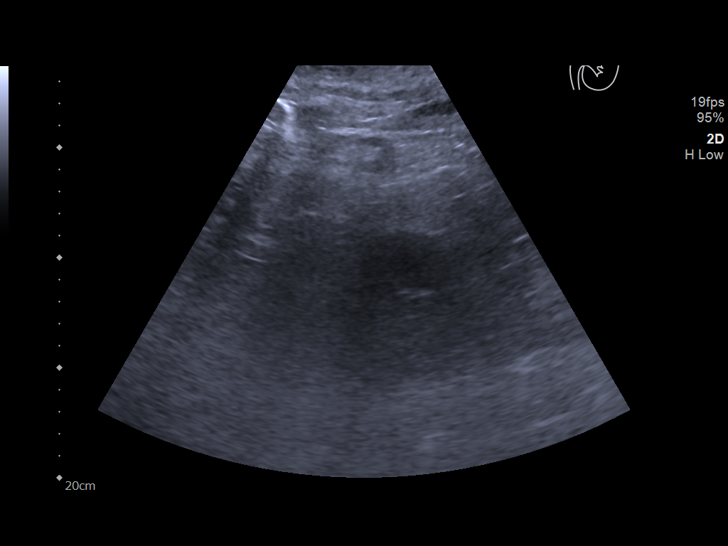
[im 9/34]
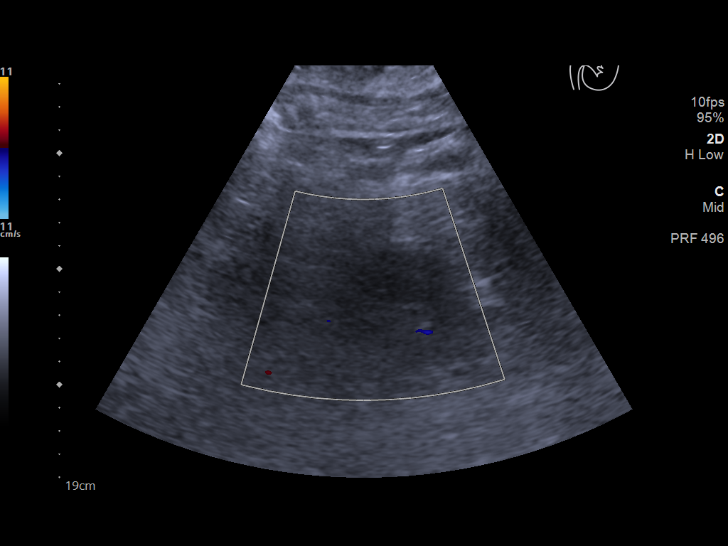
[im 12/34]
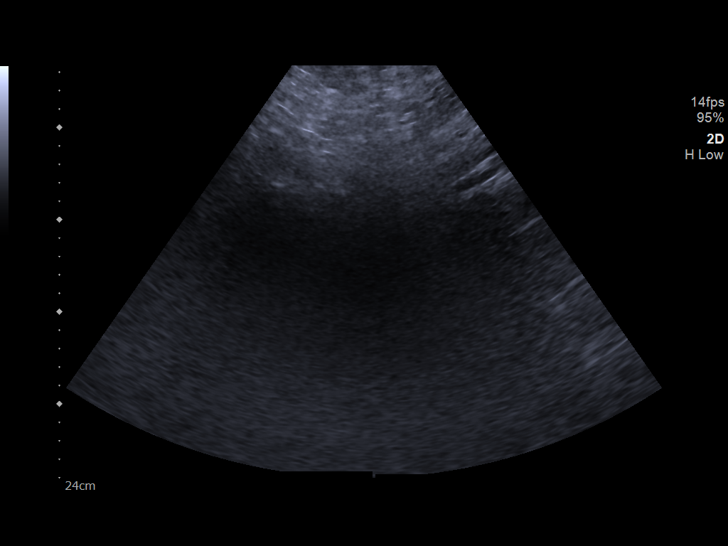
[im 13/34]
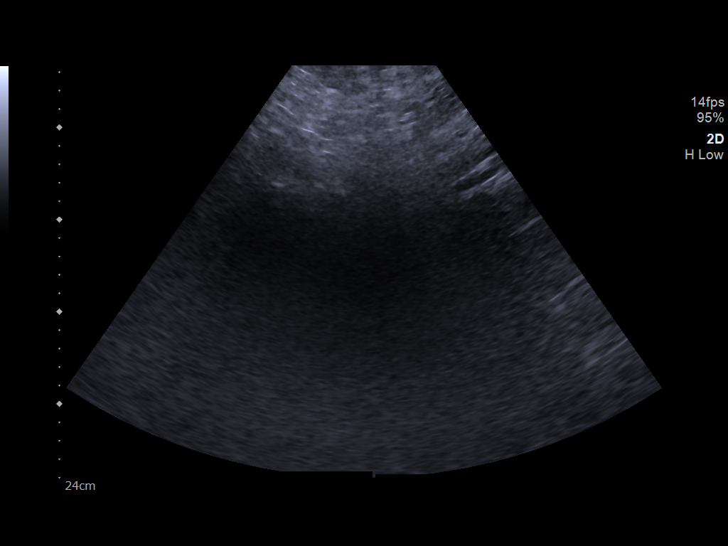
[im 16/34]
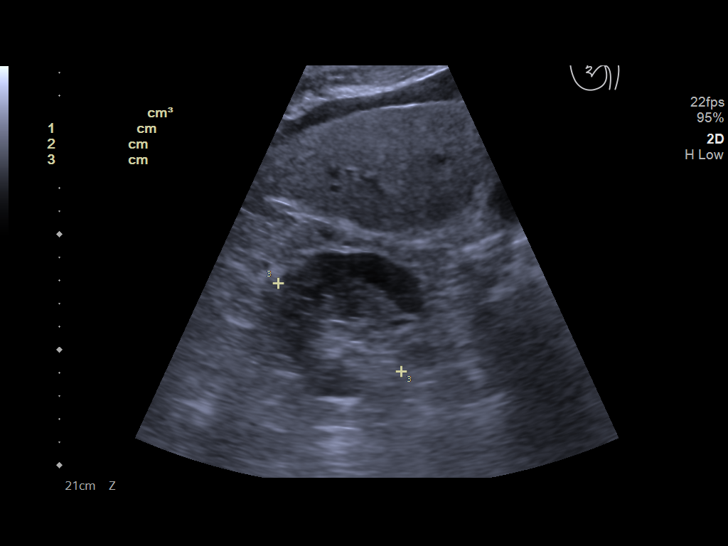
[im 18/34]
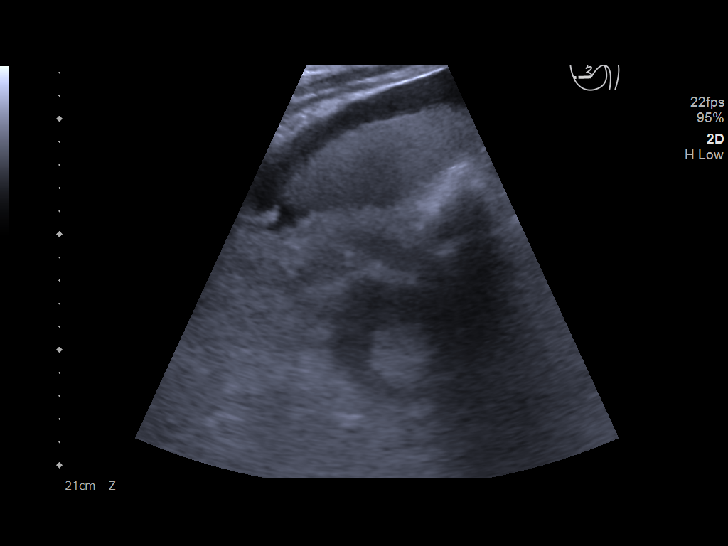
[im 21/34]
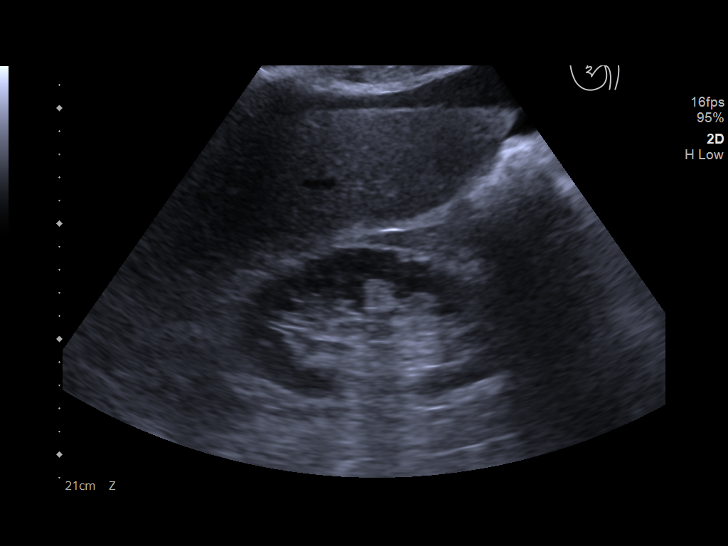
[im 23/34]
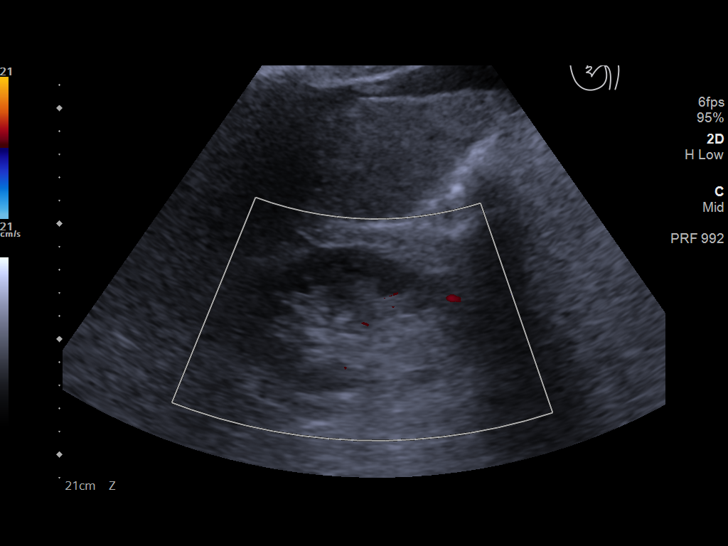
[im 25/34]
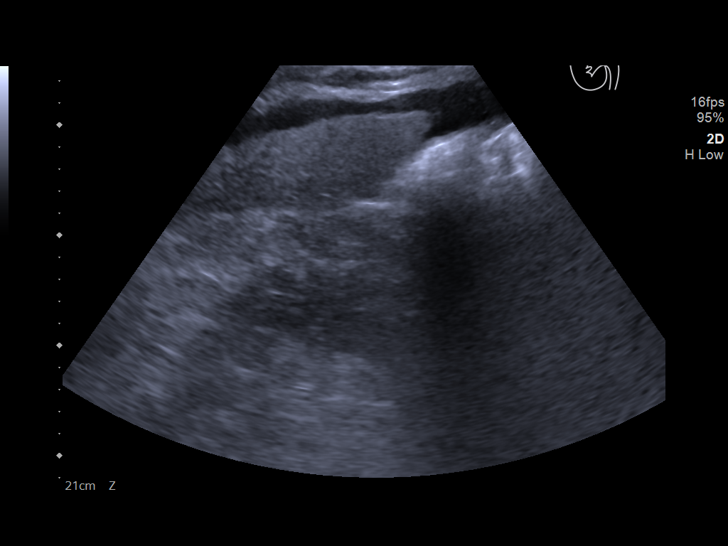
[im 28/34]
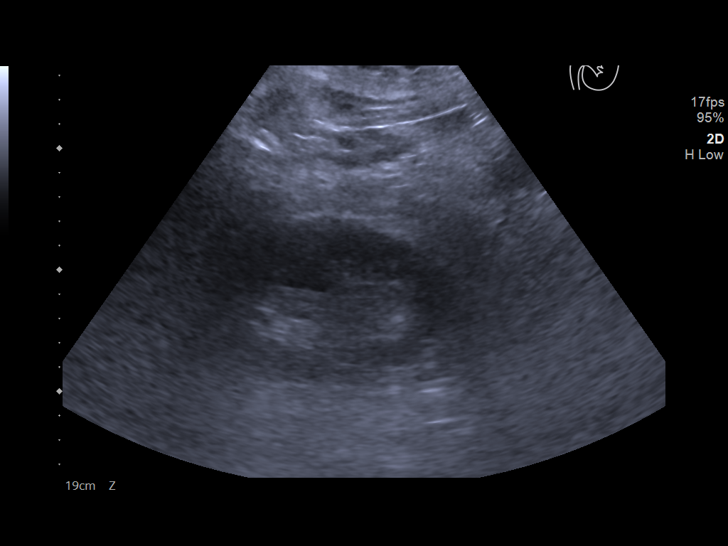
[im 31/34]
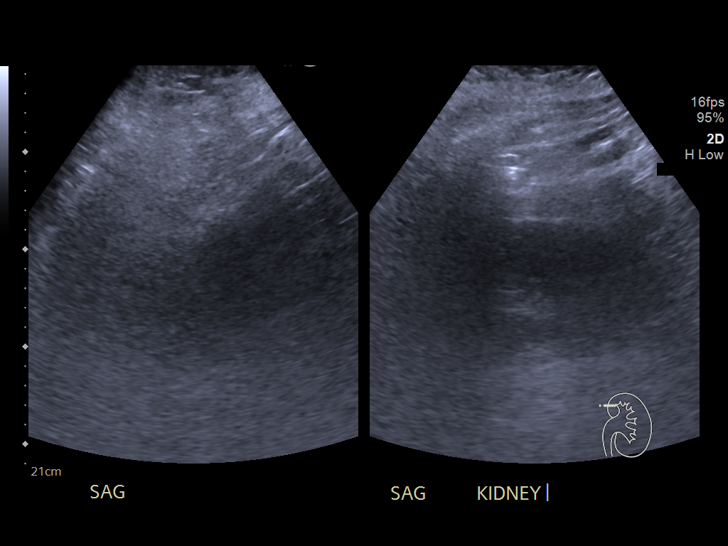
[im 34/34]
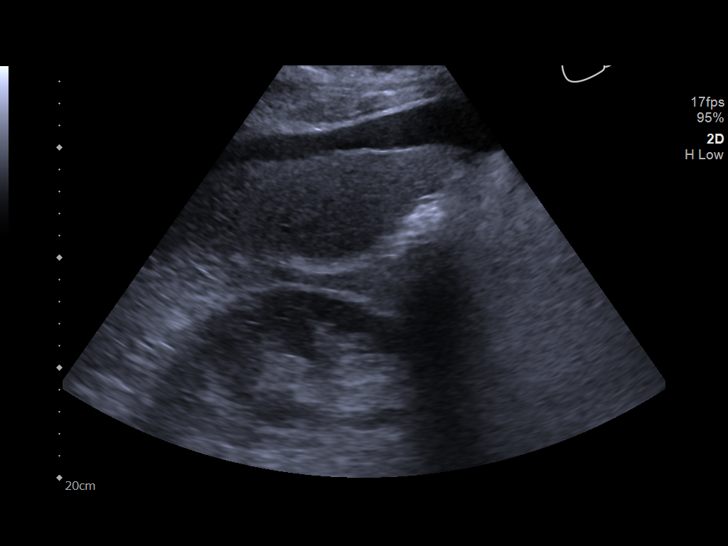

[14 of 25 positions shown; findings below may reference images not displayed]

FINDINGS: Right Kidney:

Renal measurements: 12.2 x 6.6 x 6.6 centimeters = volume: 278 mL
(stable). Echogenicity within normal limits. No mass or
hydronephrosis visualized.

Left Kidney:

Not as well visualized as the right kidney. Renal measurements:
x 7.0 x 5.5 centimeters = volume: 227 mL (not significantly
changed). Echogenicity within normal limits. No mass or
hydronephrosis visualized.

Bladder:

Could not be visualized due to bowel gas.

Other findings: Small volume ascites appears similar since [REDACTED].
Echogenic liver and nodular liver contour (images 25 and 26).
IMPRESSION: 1. Kidneys appear stable since [REDACTED] and within normal limits.
Bladder could not be identified.
2. Cirrhotic appearing liver with small volume ascites similar to
Ulst.

## 2020-02-29 IMAGING — DX DG CHEST 1V PORT
1 series · 1 of 1 positions shown · non-contrast
Comparison: July 03, 2018

CLINICAL DATA: Respiratory failure.  History of sepsis.

EXAM:
PORTABLE CHEST 1 VIEW

[chest ap]
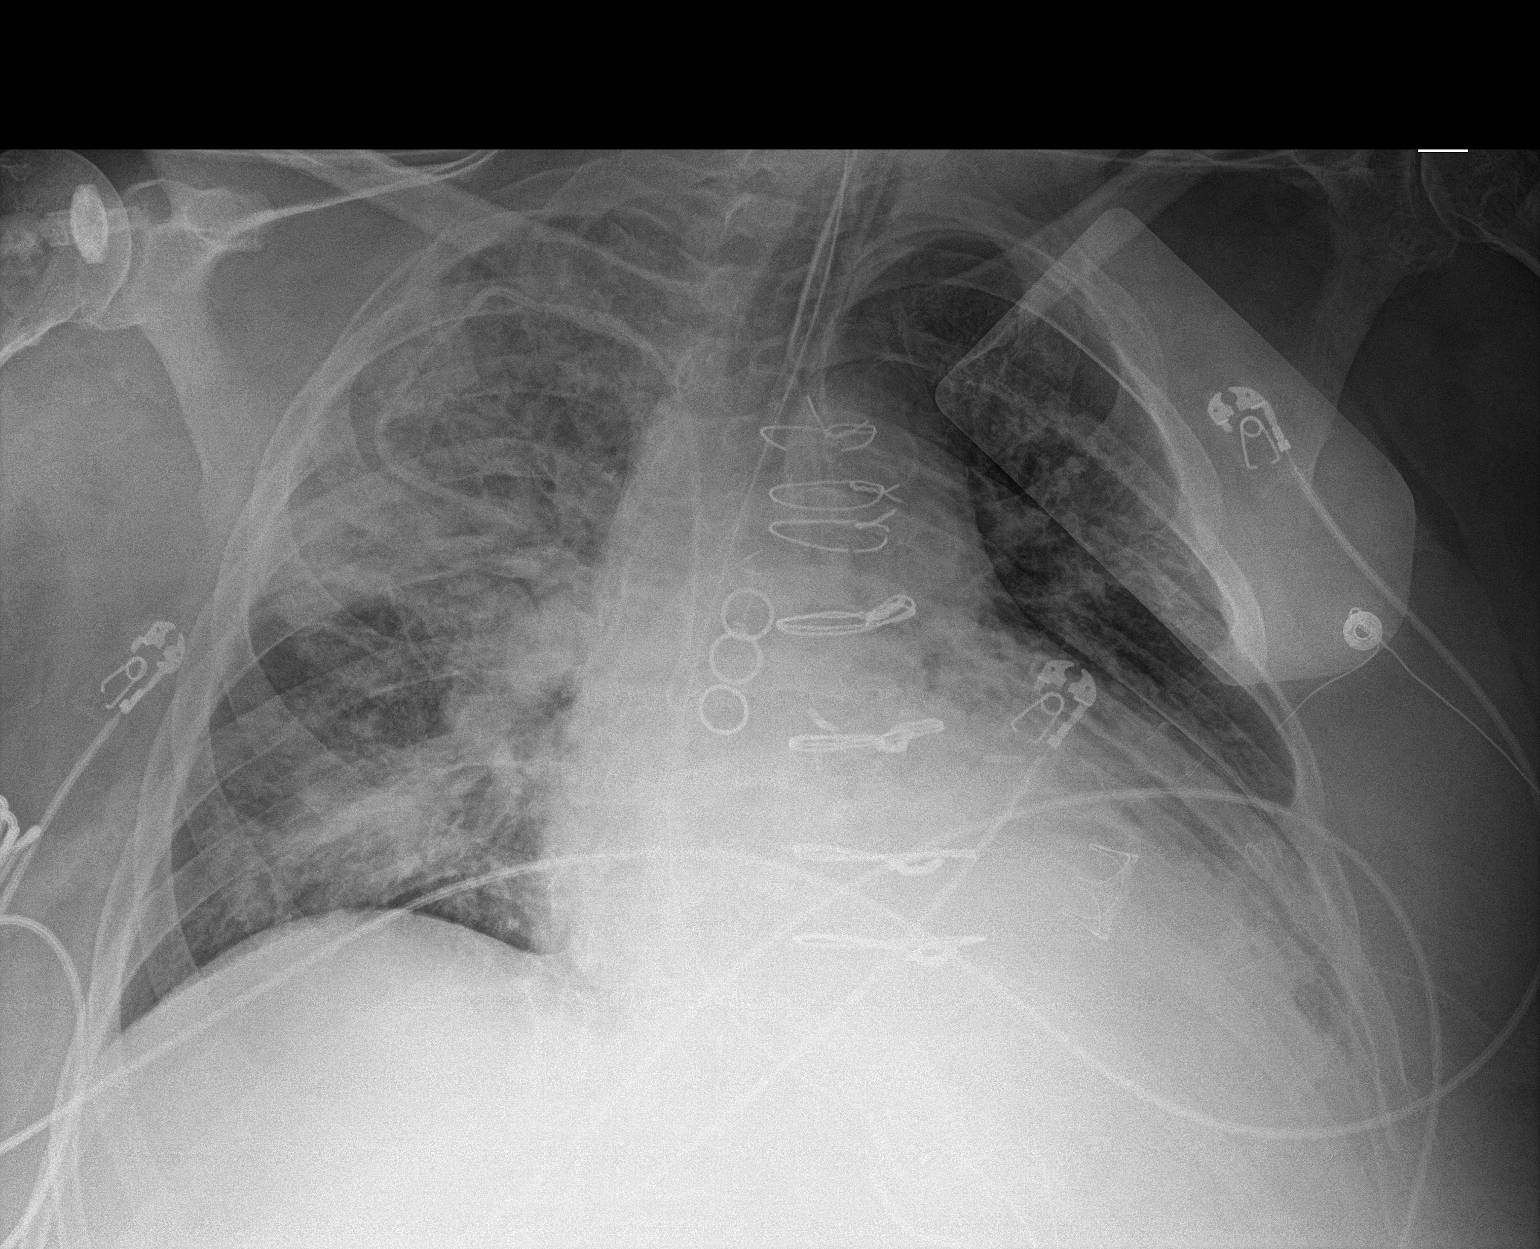

[1 of 1 positions shown; findings below may reference images not displayed]

FINDINGS: The ETT is in good position. The NG tube terminates below today's
study. Infiltrate in the right upper lung persists. New infiltrate
in the right base. Atelectasis in the left base. Stable
cardiomegaly. The hila and mediastinum are unchanged. No
pneumothorax. Stable right Port-A-Cath. No acute abnormalities
otherwise seen.
IMPRESSION: 1. Persistent infiltrate in the right upper lobe and new infiltrate
in the right lower lobe suggestive of multifocal pneumonia.
Recommend follow-up to resolution.
2. Support apparatus above.

## 2020-03-01 IMAGING — DX DG CHEST 1V PORT
1 series · 1 of 1 positions shown · non-contrast
Comparison: Prior radiograph from 07/04/2018.

CLINICAL DATA: Initial evaluation for acute hypoxia, shortness of
breath.

EXAM:
PORTABLE CHEST 1 VIEW

[chest ap]
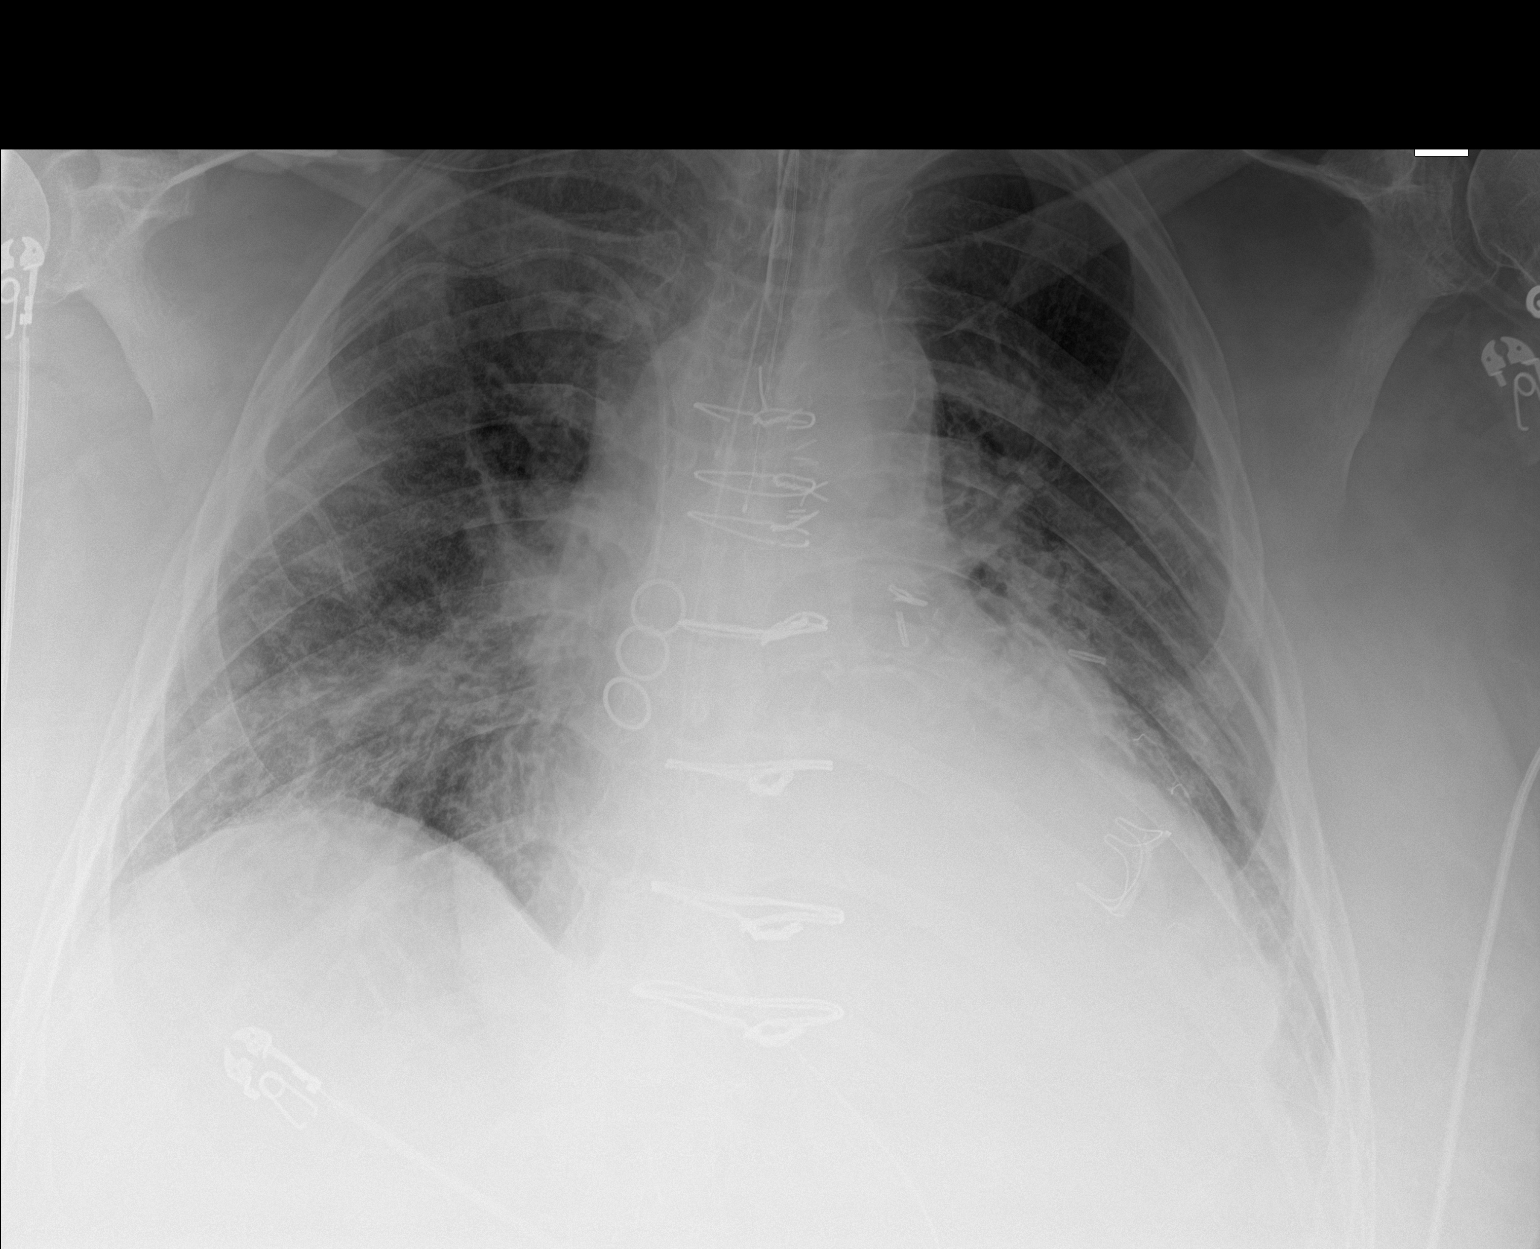

[1 of 1 positions shown; findings below may reference images not displayed]

FINDINGS: Endotracheal tube in place with tip position well above the carina.
Enteric tube courses into the abdomen. Right subclavian approach
central venous catheter in place with tip overlying the distal SVC,
stable. Cardiomegaly with sequelae of prior CABG and valvular
replacement.

Lungs hypoinflated. Previously seen right upper lobe infiltrate
improved from previous. Right lower lobe infiltrate persists but
also improved. Dense opacity within the retrocardiac left lower lobe
similar to previous, and could reflect atelectasis or consolidation.
Small bilateral pleural effusions. Perihilar vascular in diffuse
interstitial congestion. No pneumothorax.

Osseous structures unchanged.
IMPRESSION: 1. Support apparatus in satisfactory position.
2. Interval improvement in right upper and lower lobe infiltrates as
compared to previous, suggesting improved pneumonia.
3. Persistent dense opacity within the retrocardiac left lower lobe,
which could reflect atelectasis or consolidation.
4. Underlying diffuse pulmonary interstitial congestion with
probable small bilateral pleural effusions.

## 2020-03-02 IMAGING — DX DG CHEST 1V PORT
1 series · 1 of 1 positions shown · non-contrast
Comparison: 07/05/2018.

CLINICAL DATA: Intubation.

EXAM:
PORTABLE CHEST 1 VIEW

[chest]
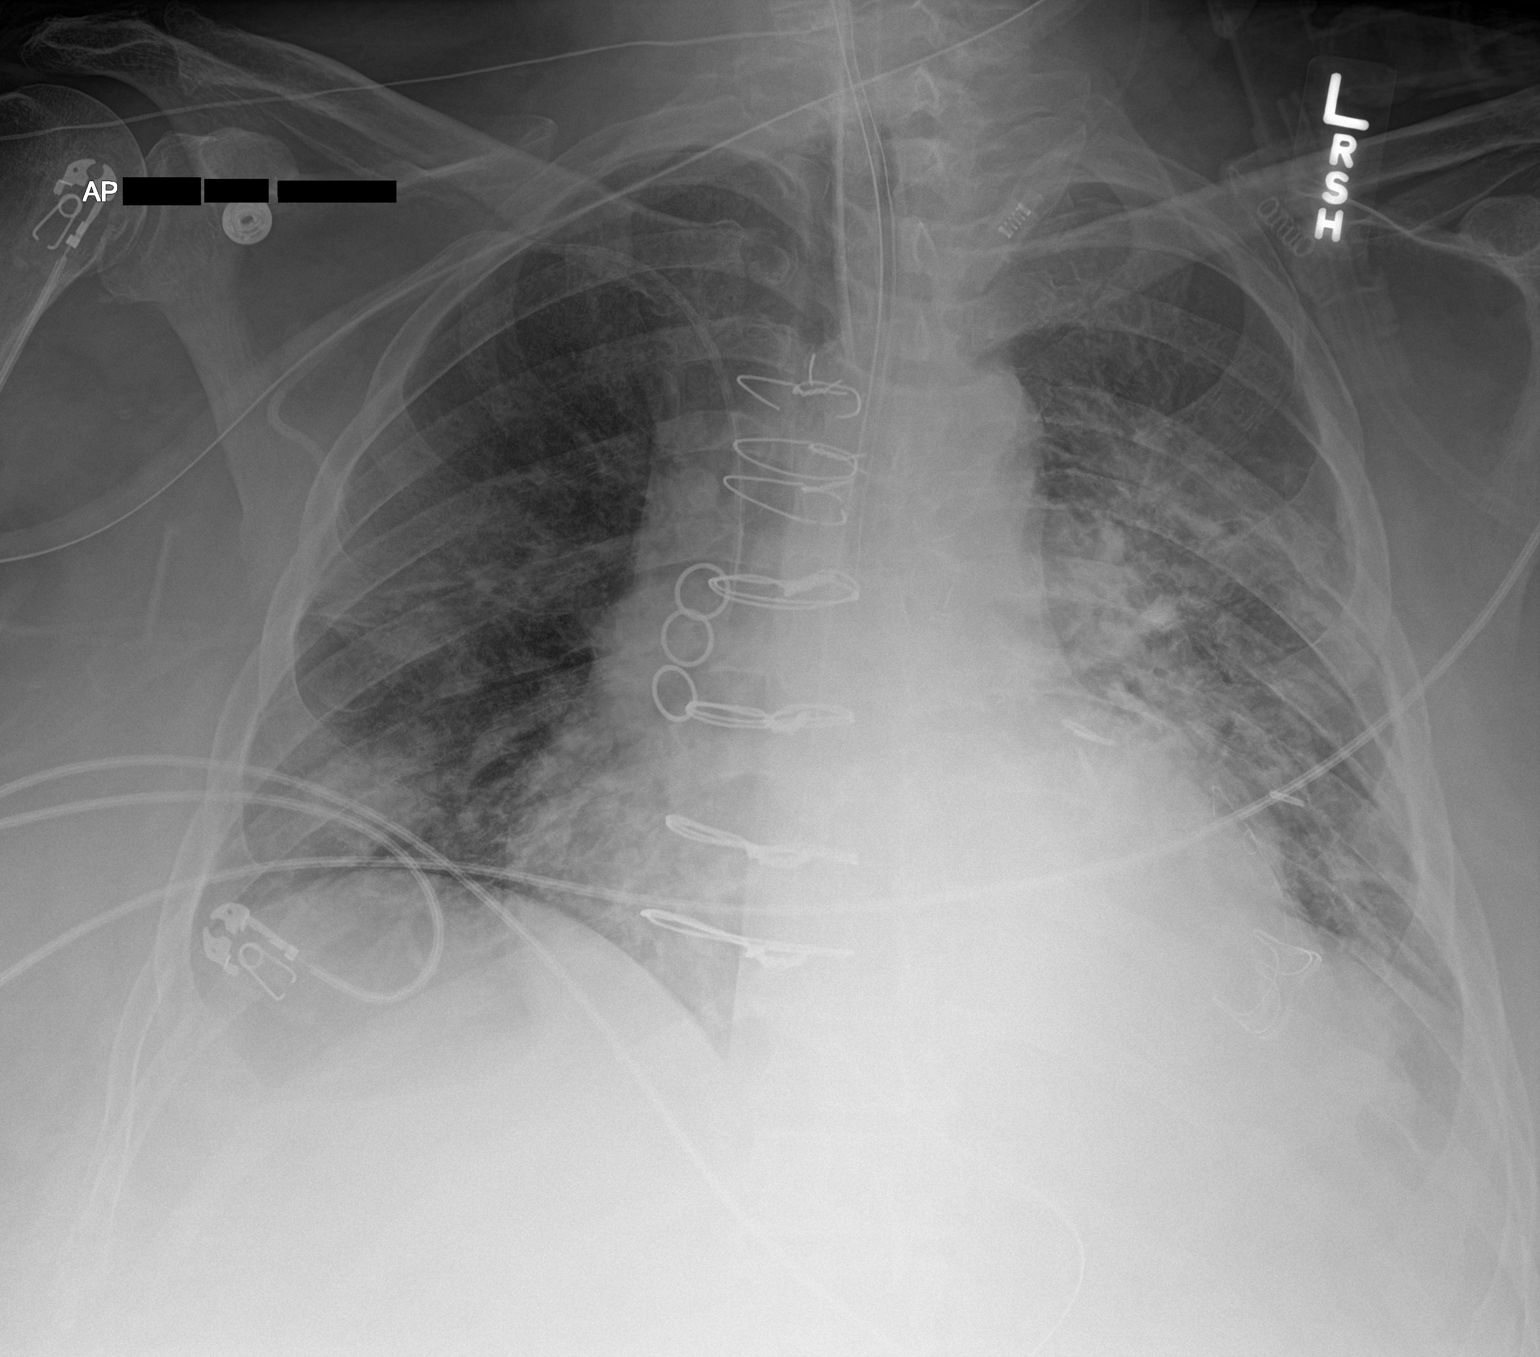

[1 of 1 positions shown; findings below may reference images not displayed]

FINDINGS: Endotracheal tube, NG tube, right PICC line in stable position.
Prior CABG. Cardiac valvular structure again noted projected over
the periphery of the heart with adjacent postsurgical change
consistent with aortic conduit. Cardiomegaly with diffuse bilateral
pulmonary infiltrates. No pleural effusion or pneumothorax.
IMPRESSION: 1.  Lines and tubes in stable position.

2. Cardiomegaly. Diffuse bilateral pulmonary infiltrates. Findings
consistent with pulmonary edema and/or bilateral multifocal
pneumonia. Stable postsurgical change.

## 2020-03-03 IMAGING — DX DG CHEST 1V PORT
1 series · 1 of 1 positions shown · non-contrast
Comparison: 07/06/2018

CLINICAL DATA: Respiratory failure

EXAM:
PORTABLE CHEST 1 VIEW

[chest]
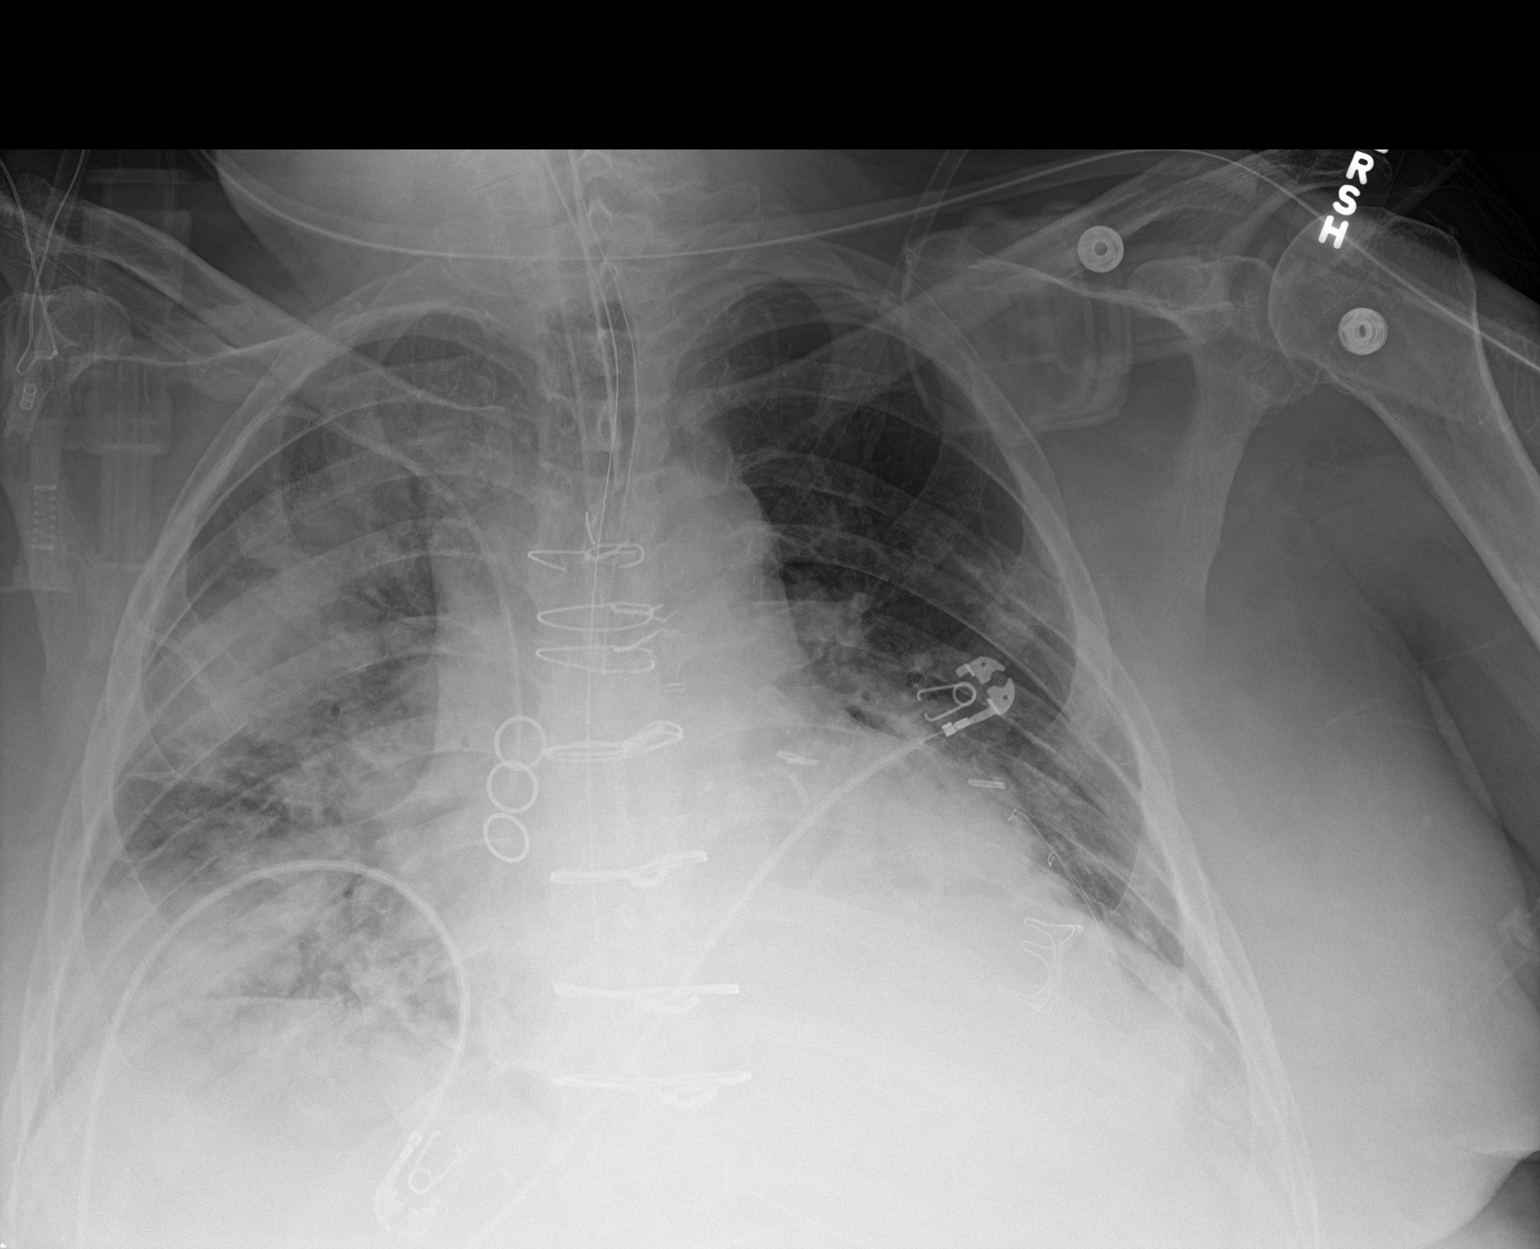

[1 of 1 positions shown; findings below may reference images not displayed]

FINDINGS: Cardiac shadow remains enlarged. Postsurgical changes are again
seen. Endotracheal tube, gastric catheter and right subclavian
central line are again seen and stable. Increasing patchy
infiltrates are noted bilaterally particularly on the right.
Postsurgical changes consistent with the apical to aortic conduit
are again seen and stable. No bony abnormality is noted.
IMPRESSION: Tubes and lines as described above. Increasing right upper and right
basilar infiltrates when compared with the prior exam.

Postsurgical changes are again noted.
